# Patient Record
Sex: Male | Born: 1955 | Race: White | Hispanic: No | Marital: Married | Smoking: Former smoker
Health system: Southern US, Community
[De-identification: ages and names within clinical notes are randomized; demographics above are authoritative.]

## PROBLEM LIST (undated history)

## (undated) DIAGNOSIS — K219 Gastro-esophageal reflux disease without esophagitis: Secondary | ICD-10-CM

## (undated) DIAGNOSIS — K250 Acute gastric ulcer with hemorrhage: Secondary | ICD-10-CM

## (undated) DIAGNOSIS — M25551 Pain in right hip: Secondary | ICD-10-CM

## (undated) DIAGNOSIS — G4730 Sleep apnea, unspecified: Secondary | ICD-10-CM

## (undated) DIAGNOSIS — C679 Malignant neoplasm of bladder, unspecified: Secondary | ICD-10-CM

## (undated) DIAGNOSIS — K922 Gastrointestinal hemorrhage, unspecified: Secondary | ICD-10-CM

## (undated) DIAGNOSIS — R197 Diarrhea, unspecified: Secondary | ICD-10-CM

## (undated) DIAGNOSIS — R112 Nausea with vomiting, unspecified: Secondary | ICD-10-CM

## (undated) DIAGNOSIS — Z860100 Personal history of colon polyps, unspecified: Secondary | ICD-10-CM

## (undated) DIAGNOSIS — E785 Hyperlipidemia, unspecified: Secondary | ICD-10-CM

## (undated) DIAGNOSIS — I10 Essential (primary) hypertension: Secondary | ICD-10-CM

## (undated) DIAGNOSIS — S83207A Unspecified tear of unspecified meniscus, current injury, left knee, initial encounter: Secondary | ICD-10-CM

## (undated) DIAGNOSIS — I7300 Raynaud's syndrome without gangrene: Secondary | ICD-10-CM

## (undated) DIAGNOSIS — K648 Other hemorrhoids: Secondary | ICD-10-CM

## (undated) DIAGNOSIS — R079 Chest pain, unspecified: Secondary | ICD-10-CM

## (undated) DIAGNOSIS — N529 Male erectile dysfunction, unspecified: Secondary | ICD-10-CM

## (undated) DIAGNOSIS — G4710 Hypersomnia, unspecified: Secondary | ICD-10-CM

## (undated) DIAGNOSIS — M1990 Unspecified osteoarthritis, unspecified site: Secondary | ICD-10-CM

## (undated) DIAGNOSIS — N644 Mastodynia: Secondary | ICD-10-CM

## (undated) DIAGNOSIS — Z0000 Encounter for general adult medical examination without abnormal findings: Secondary | ICD-10-CM

## (undated) DIAGNOSIS — K5730 Diverticulosis of large intestine without perforation or abscess without bleeding: Secondary | ICD-10-CM

## (undated) DIAGNOSIS — M4800 Spinal stenosis, site unspecified: Secondary | ICD-10-CM

## (undated) DIAGNOSIS — N200 Calculus of kidney: Secondary | ICD-10-CM

## (undated) DIAGNOSIS — IMO0002 Reserved for concepts with insufficient information to code with codable children: Secondary | ICD-10-CM

## (undated) DIAGNOSIS — M542 Cervicalgia: Secondary | ICD-10-CM

## (undated) DIAGNOSIS — K5790 Diverticulosis of intestine, part unspecified, without perforation or abscess without bleeding: Secondary | ICD-10-CM

## (undated) DIAGNOSIS — I7090 Unspecified atherosclerosis: Secondary | ICD-10-CM

## (undated) DIAGNOSIS — H9190 Unspecified hearing loss, unspecified ear: Secondary | ICD-10-CM

## (undated) DIAGNOSIS — Z87442 Personal history of urinary calculi: Secondary | ICD-10-CM

## (undated) DIAGNOSIS — J45909 Unspecified asthma, uncomplicated: Secondary | ICD-10-CM

## (undated) DIAGNOSIS — M109 Gout, unspecified: Secondary | ICD-10-CM

## (undated) DIAGNOSIS — M25562 Pain in left knee: Secondary | ICD-10-CM

## (undated) DIAGNOSIS — D649 Anemia, unspecified: Secondary | ICD-10-CM

## (undated) DIAGNOSIS — M25552 Pain in left hip: Secondary | ICD-10-CM

## (undated) DIAGNOSIS — Z9889 Other specified postprocedural states: Secondary | ICD-10-CM

## (undated) DIAGNOSIS — I2510 Atherosclerotic heart disease of native coronary artery without angina pectoris: Secondary | ICD-10-CM

## (undated) DIAGNOSIS — E1165 Type 2 diabetes mellitus with hyperglycemia: Secondary | ICD-10-CM

## (undated) DIAGNOSIS — F1010 Alcohol abuse, uncomplicated: Secondary | ICD-10-CM

## (undated) DIAGNOSIS — D126 Benign neoplasm of colon, unspecified: Secondary | ICD-10-CM

## (undated) HISTORY — DX: Hyperlipidemia, unspecified: E78.5

## (undated) HISTORY — DX: Gastro-esophageal reflux disease without esophagitis: K21.9

## (undated) HISTORY — DX: Malignant neoplasm of bladder, unspecified: C67.9

## (undated) HISTORY — PX: TONSILLECTOMY: SUR1361

## (undated) HISTORY — DX: Unspecified atherosclerosis: I70.90

## (undated) HISTORY — DX: Gastrointestinal hemorrhage, unspecified: K92.2

## (undated) HISTORY — DX: Male erectile dysfunction, unspecified: N52.9

## (undated) HISTORY — DX: Encounter for general adult medical examination without abnormal findings: Z00.00

## (undated) HISTORY — DX: Sleep apnea, unspecified: G47.30

## (undated) HISTORY — DX: Pain in left hip: M25.552

## (undated) HISTORY — DX: Anemia, unspecified: D64.9

## (undated) HISTORY — DX: Pain in right hip: M25.551

## (undated) HISTORY — DX: Personal history of colonic polyps: Z86.010

## (undated) HISTORY — DX: Atherosclerotic heart disease of native coronary artery without angina pectoris: I25.10

## (undated) HISTORY — DX: Morbid (severe) obesity due to excess calories: E66.01

## (undated) HISTORY — DX: Diverticulosis of large intestine without perforation or abscess without bleeding: K57.30

## (undated) HISTORY — DX: Pain in left knee: M25.562

## (undated) HISTORY — DX: Reserved for concepts with insufficient information to code with codable children: IMO0002

## (undated) HISTORY — DX: Nausea with vomiting, unspecified: R11.2

## (undated) HISTORY — DX: Diarrhea, unspecified: R19.7

## (undated) HISTORY — DX: Essential (primary) hypertension: I10

## (undated) HISTORY — PX: CARDIAC CATHETERIZATION: SHX172

## (undated) HISTORY — DX: Nausea with vomiting, unspecified: Z98.890

## (undated) HISTORY — DX: Personal history of colon polyps, unspecified: Z86.0100

## (undated) HISTORY — DX: Unspecified tear of unspecified meniscus, current injury, left knee, initial encounter: S83.207A

## (undated) HISTORY — DX: Other hemorrhoids: K64.8

## (undated) HISTORY — PX: HERNIA REPAIR: SHX51

## (undated) HISTORY — DX: Acute gastric ulcer with hemorrhage: K25.0

## (undated) HISTORY — DX: Hypersomnia, unspecified: G47.10

## (undated) HISTORY — DX: Cervicalgia: M54.2

## (undated) HISTORY — DX: Alcohol abuse, uncomplicated: F10.10

## (undated) HISTORY — DX: Gout, unspecified: M10.9

## (undated) HISTORY — DX: Unspecified hearing loss, unspecified ear: H91.90

## (undated) HISTORY — PX: BICEPS TENDON REPAIR: SHX566

## (undated) HISTORY — PX: GASTRIC BYPASS: SHX52

## (undated) HISTORY — DX: Benign neoplasm of colon, unspecified: D12.6

## (undated) HISTORY — PX: OTHER SURGICAL HISTORY: SHX169

## (undated) HISTORY — DX: Mastodynia: N64.4

## (undated) HISTORY — PX: COLONOSCOPY: SHX174

## (undated) HISTORY — DX: Type 2 diabetes mellitus with hyperglycemia: E11.65

---

## 1992-04-20 HISTORY — PX: ANKLE SURGERY: SHX546

## 1998-02-28 ENCOUNTER — Ambulatory Visit (HOSPITAL_COMMUNITY): Admission: RE | Admit: 1998-02-28 | Discharge: 1998-02-28 | Payer: Self-pay | Admitting: Internal Medicine

## 1998-02-28 ENCOUNTER — Encounter: Payer: Self-pay | Admitting: Internal Medicine

## 1998-11-22 ENCOUNTER — Emergency Department (HOSPITAL_COMMUNITY): Admission: EM | Admit: 1998-11-22 | Discharge: 1998-11-22 | Payer: Self-pay | Admitting: Emergency Medicine

## 1999-02-27 ENCOUNTER — Ambulatory Visit (HOSPITAL_COMMUNITY): Admission: RE | Admit: 1999-02-27 | Discharge: 1999-02-27 | Payer: Self-pay | Admitting: Internal Medicine

## 1999-02-27 ENCOUNTER — Encounter (INDEPENDENT_AMBULATORY_CARE_PROVIDER_SITE_OTHER): Payer: Self-pay | Admitting: Specialist

## 2001-12-20 ENCOUNTER — Encounter: Payer: Self-pay | Admitting: Specialist

## 2001-12-20 ENCOUNTER — Ambulatory Visit (HOSPITAL_COMMUNITY): Admission: RE | Admit: 2001-12-20 | Discharge: 2001-12-20 | Payer: Self-pay | Admitting: *Deleted

## 2001-12-31 ENCOUNTER — Ambulatory Visit (HOSPITAL_COMMUNITY): Admission: RE | Admit: 2001-12-31 | Discharge: 2001-12-31 | Payer: Self-pay | Admitting: Specialist

## 2001-12-31 ENCOUNTER — Encounter: Payer: Self-pay | Admitting: Specialist

## 2002-11-09 ENCOUNTER — Encounter (INDEPENDENT_AMBULATORY_CARE_PROVIDER_SITE_OTHER): Payer: Self-pay | Admitting: Specialist

## 2002-11-09 ENCOUNTER — Ambulatory Visit (HOSPITAL_COMMUNITY): Admission: RE | Admit: 2002-11-09 | Discharge: 2002-11-09 | Payer: Self-pay | Admitting: Internal Medicine

## 2004-04-18 ENCOUNTER — Ambulatory Visit: Payer: Self-pay | Admitting: Internal Medicine

## 2004-06-30 ENCOUNTER — Ambulatory Visit: Payer: Self-pay | Admitting: Internal Medicine

## 2004-09-29 ENCOUNTER — Ambulatory Visit: Payer: Self-pay | Admitting: Internal Medicine

## 2004-11-11 ENCOUNTER — Ambulatory Visit: Payer: Self-pay | Admitting: Internal Medicine

## 2005-02-24 ENCOUNTER — Ambulatory Visit: Payer: Self-pay | Admitting: Family Medicine

## 2005-02-25 ENCOUNTER — Ambulatory Visit: Payer: Self-pay | Admitting: Family Medicine

## 2005-02-25 ENCOUNTER — Ambulatory Visit (HOSPITAL_COMMUNITY): Admission: RE | Admit: 2005-02-25 | Discharge: 2005-02-25 | Payer: Self-pay | Admitting: Family Medicine

## 2005-03-11 ENCOUNTER — Ambulatory Visit: Payer: Self-pay | Admitting: Internal Medicine

## 2005-05-04 ENCOUNTER — Encounter: Admission: RE | Admit: 2005-05-04 | Discharge: 2005-08-02 | Payer: Self-pay | Admitting: Internal Medicine

## 2005-06-30 ENCOUNTER — Ambulatory Visit: Payer: Self-pay | Admitting: Internal Medicine

## 2005-10-20 ENCOUNTER — Ambulatory Visit: Payer: Self-pay | Admitting: Internal Medicine

## 2005-11-18 LAB — HM COLONOSCOPY

## 2005-11-27 ENCOUNTER — Ambulatory Visit: Payer: Self-pay | Admitting: Internal Medicine

## 2005-12-08 ENCOUNTER — Ambulatory Visit: Payer: Self-pay | Admitting: Internal Medicine

## 2006-03-05 ENCOUNTER — Ambulatory Visit: Payer: Self-pay | Admitting: Internal Medicine

## 2006-05-18 ENCOUNTER — Ambulatory Visit: Payer: Self-pay | Admitting: Internal Medicine

## 2006-05-18 LAB — CONVERTED CEMR LAB
ALT: 43 units/L — ABNORMAL HIGH (ref 0–40)
AST: 37 units/L (ref 0–37)
BUN: 13 mg/dL (ref 6–23)
Cholesterol: 188 mg/dL (ref 0–200)
Creatinine, Ser: 1.1 mg/dL (ref 0.4–1.5)
Creatinine,U: 74.8 mg/dL
Direct LDL: 110 mg/dL
HDL: 38.4 mg/dL — ABNORMAL LOW (ref >39.0)
Hgb A1c MFr Bld: 6.8 % — ABNORMAL HIGH (ref 4.6–6.0)
Microalb Creat Ratio: 4 mg/g (ref 0.0–30.0)
Microalb, Ur: 0.3 mg/dL (ref 0.0–1.9)
Potassium: 4.1 meq/L (ref 3.5–5.1)
Total CHOL/HDL Ratio: 4.9
Triglycerides: 256 mg/dL (ref 0–149)
VLDL: 51 mg/dL — ABNORMAL HIGH (ref 0–40)

## 2006-06-21 ENCOUNTER — Ambulatory Visit: Payer: Self-pay | Admitting: Internal Medicine

## 2006-08-02 ENCOUNTER — Ambulatory Visit: Payer: Self-pay | Admitting: Internal Medicine

## 2006-09-24 ENCOUNTER — Encounter: Payer: Self-pay | Admitting: Internal Medicine

## 2006-10-20 ENCOUNTER — Encounter: Payer: Self-pay | Admitting: Internal Medicine

## 2006-11-09 ENCOUNTER — Encounter: Payer: Self-pay | Admitting: Internal Medicine

## 2006-11-10 ENCOUNTER — Encounter: Payer: Self-pay | Admitting: Internal Medicine

## 2007-02-08 ENCOUNTER — Encounter: Payer: Self-pay | Admitting: Internal Medicine

## 2007-02-28 ENCOUNTER — Telehealth (INDEPENDENT_AMBULATORY_CARE_PROVIDER_SITE_OTHER): Payer: Self-pay | Admitting: *Deleted

## 2007-03-07 ENCOUNTER — Telehealth (INDEPENDENT_AMBULATORY_CARE_PROVIDER_SITE_OTHER): Payer: Self-pay | Admitting: *Deleted

## 2007-03-24 ENCOUNTER — Encounter: Payer: Self-pay | Admitting: Internal Medicine

## 2007-06-30 ENCOUNTER — Encounter: Payer: Self-pay | Admitting: Internal Medicine

## 2007-07-05 ENCOUNTER — Ambulatory Visit: Payer: Self-pay | Admitting: Internal Medicine

## 2007-07-05 DIAGNOSIS — D126 Benign neoplasm of colon, unspecified: Secondary | ICD-10-CM

## 2007-07-05 DIAGNOSIS — N529 Male erectile dysfunction, unspecified: Secondary | ICD-10-CM

## 2007-07-05 DIAGNOSIS — K219 Gastro-esophageal reflux disease without esophagitis: Secondary | ICD-10-CM

## 2007-07-05 DIAGNOSIS — E119 Type 2 diabetes mellitus without complications: Secondary | ICD-10-CM | POA: Insufficient documentation

## 2007-07-05 HISTORY — DX: Gastro-esophageal reflux disease without esophagitis: K21.9

## 2007-07-05 HISTORY — DX: Male erectile dysfunction, unspecified: N52.9

## 2007-07-05 HISTORY — DX: Benign neoplasm of colon, unspecified: D12.6

## 2007-07-08 ENCOUNTER — Encounter (INDEPENDENT_AMBULATORY_CARE_PROVIDER_SITE_OTHER): Payer: Self-pay | Admitting: *Deleted

## 2007-08-11 ENCOUNTER — Encounter: Payer: Self-pay | Admitting: Internal Medicine

## 2007-08-17 ENCOUNTER — Encounter: Payer: Self-pay | Admitting: Internal Medicine

## 2007-09-06 ENCOUNTER — Telehealth (INDEPENDENT_AMBULATORY_CARE_PROVIDER_SITE_OTHER): Payer: Self-pay | Admitting: *Deleted

## 2007-10-06 ENCOUNTER — Encounter: Payer: Self-pay | Admitting: Internal Medicine

## 2007-12-01 ENCOUNTER — Ambulatory Visit: Payer: Self-pay | Admitting: Internal Medicine

## 2008-03-06 ENCOUNTER — Encounter: Payer: Self-pay | Admitting: Internal Medicine

## 2008-05-01 ENCOUNTER — Ambulatory Visit: Payer: Self-pay | Admitting: Internal Medicine

## 2008-05-29 ENCOUNTER — Encounter: Payer: Self-pay | Admitting: Internal Medicine

## 2008-06-18 ENCOUNTER — Telehealth (INDEPENDENT_AMBULATORY_CARE_PROVIDER_SITE_OTHER): Payer: Self-pay | Admitting: *Deleted

## 2008-06-29 ENCOUNTER — Telehealth: Payer: Self-pay | Admitting: Internal Medicine

## 2008-06-29 DIAGNOSIS — R0609 Other forms of dyspnea: Secondary | ICD-10-CM | POA: Insufficient documentation

## 2008-06-29 DIAGNOSIS — R0989 Other specified symptoms and signs involving the circulatory and respiratory systems: Secondary | ICD-10-CM | POA: Insufficient documentation

## 2008-07-03 ENCOUNTER — Encounter (INDEPENDENT_AMBULATORY_CARE_PROVIDER_SITE_OTHER): Payer: Self-pay | Admitting: *Deleted

## 2008-07-26 ENCOUNTER — Ambulatory Visit (HOSPITAL_BASED_OUTPATIENT_CLINIC_OR_DEPARTMENT_OTHER): Admission: RE | Admit: 2008-07-26 | Discharge: 2008-07-26 | Payer: Self-pay | Admitting: Pulmonary Disease

## 2008-07-26 ENCOUNTER — Ambulatory Visit: Payer: Self-pay | Admitting: Pulmonary Disease

## 2008-07-26 DIAGNOSIS — G4710 Hypersomnia, unspecified: Secondary | ICD-10-CM

## 2008-07-26 DIAGNOSIS — G4730 Sleep apnea, unspecified: Secondary | ICD-10-CM

## 2008-07-26 HISTORY — DX: Hypersomnia, unspecified: G47.10

## 2008-07-26 HISTORY — DX: Sleep apnea, unspecified: G47.30

## 2008-08-06 ENCOUNTER — Ambulatory Visit: Payer: Self-pay | Admitting: Pulmonary Disease

## 2008-08-10 ENCOUNTER — Ambulatory Visit: Payer: Self-pay | Admitting: Pulmonary Disease

## 2008-08-13 ENCOUNTER — Encounter: Payer: Self-pay | Admitting: Internal Medicine

## 2008-08-14 ENCOUNTER — Encounter: Payer: Self-pay | Admitting: Pulmonary Disease

## 2008-08-23 ENCOUNTER — Encounter: Payer: Self-pay | Admitting: Pulmonary Disease

## 2008-09-02 ENCOUNTER — Encounter: Payer: Self-pay | Admitting: Pulmonary Disease

## 2008-09-06 ENCOUNTER — Telehealth (INDEPENDENT_AMBULATORY_CARE_PROVIDER_SITE_OTHER): Payer: Self-pay | Admitting: *Deleted

## 2008-09-07 ENCOUNTER — Encounter: Payer: Self-pay | Admitting: Internal Medicine

## 2008-09-11 ENCOUNTER — Ambulatory Visit: Payer: Self-pay | Admitting: Pulmonary Disease

## 2008-09-27 ENCOUNTER — Telehealth (INDEPENDENT_AMBULATORY_CARE_PROVIDER_SITE_OTHER): Payer: Self-pay | Admitting: *Deleted

## 2008-10-04 ENCOUNTER — Encounter: Payer: Self-pay | Admitting: Internal Medicine

## 2008-10-29 ENCOUNTER — Ambulatory Visit: Payer: Self-pay | Admitting: Internal Medicine

## 2008-10-29 DIAGNOSIS — Z860100 Personal history of colon polyps, unspecified: Secondary | ICD-10-CM | POA: Insufficient documentation

## 2008-10-29 DIAGNOSIS — K5730 Diverticulosis of large intestine without perforation or abscess without bleeding: Secondary | ICD-10-CM

## 2008-10-29 HISTORY — DX: Personal history of colonic polyps: Z86.010

## 2008-10-29 HISTORY — DX: Diverticulosis of large intestine without perforation or abscess without bleeding: K57.30

## 2008-10-29 HISTORY — DX: Personal history of colon polyps, unspecified: Z86.0100

## 2008-10-31 ENCOUNTER — Encounter (INDEPENDENT_AMBULATORY_CARE_PROVIDER_SITE_OTHER): Payer: Self-pay | Admitting: *Deleted

## 2008-10-31 ENCOUNTER — Telehealth (INDEPENDENT_AMBULATORY_CARE_PROVIDER_SITE_OTHER): Payer: Self-pay | Admitting: *Deleted

## 2008-11-15 ENCOUNTER — Ambulatory Visit: Payer: Self-pay | Admitting: Internal Medicine

## 2008-11-16 ENCOUNTER — Encounter (INDEPENDENT_AMBULATORY_CARE_PROVIDER_SITE_OTHER): Payer: Self-pay | Admitting: *Deleted

## 2008-11-16 ENCOUNTER — Telehealth: Payer: Self-pay | Admitting: Internal Medicine

## 2008-11-16 ENCOUNTER — Emergency Department (HOSPITAL_COMMUNITY): Admission: EM | Admit: 2008-11-16 | Discharge: 2008-11-16 | Payer: Self-pay | Admitting: Emergency Medicine

## 2008-11-19 ENCOUNTER — Encounter: Payer: Self-pay | Admitting: Internal Medicine

## 2008-11-20 ENCOUNTER — Encounter (INDEPENDENT_AMBULATORY_CARE_PROVIDER_SITE_OTHER): Payer: Self-pay | Admitting: *Deleted

## 2008-11-20 LAB — CONVERTED CEMR LAB
Basophils Absolute: 0 10*3/uL (ref 0.0–0.1)
Basophils Relative: 0.5 % (ref 0.0–3.0)
Eosinophils Absolute: 0.2 10*3/uL (ref 0.0–0.7)
Eosinophils Relative: 3.6 % (ref 0.0–5.0)
Folate: 11.2 ng/mL
HCT: 37.3 % — ABNORMAL LOW (ref 39.0–52.0)
Hemoglobin: 12.7 g/dL — ABNORMAL LOW (ref 13.0–17.0)
Iron: 66 ug/dL (ref 42–165)
Lymphocytes Relative: 28.7 % (ref 12.0–46.0)
Lymphs Abs: 1.7 10*3/uL (ref 0.7–4.0)
MCHC: 34.2 g/dL (ref 30.0–36.0)
MCV: 85.8 fL (ref 78.0–100.0)
Monocytes Absolute: 0.6 10*3/uL (ref 0.1–1.0)
Monocytes Relative: 9.8 % (ref 3.0–12.0)
Neutro Abs: 3.3 10*3/uL (ref 1.4–7.7)
Neutrophils Relative %: 57.4 % (ref 43.0–77.0)
Platelets: 207 10*3/uL (ref 150.0–400.0)
RBC: 4.35 M/uL (ref 4.22–5.81)
RDW: 13 % (ref 11.5–14.6)
Saturation Ratios: 16.9 % — ABNORMAL LOW (ref 20.0–50.0)
Transferrin: 279.3 mg/dL (ref 212.0–360.0)
Vitamin B-12: 221 pg/mL (ref 211–911)
WBC: 5.8 10*3/uL (ref 4.5–10.5)

## 2008-11-26 ENCOUNTER — Ambulatory Visit: Payer: Self-pay | Admitting: Pulmonary Disease

## 2008-12-04 ENCOUNTER — Encounter: Payer: Self-pay | Admitting: Internal Medicine

## 2008-12-05 ENCOUNTER — Encounter: Payer: Self-pay | Admitting: Internal Medicine

## 2009-03-06 ENCOUNTER — Ambulatory Visit: Payer: Self-pay | Admitting: Internal Medicine

## 2009-03-06 DIAGNOSIS — N390 Urinary tract infection, site not specified: Secondary | ICD-10-CM | POA: Insufficient documentation

## 2009-03-06 LAB — CONVERTED CEMR LAB
Bilirubin Urine: NEGATIVE
Blood in Urine, dipstick: NEGATIVE
Glucose, Urine, Semiquant: NEGATIVE
Ketones, urine, test strip: NEGATIVE
Nitrite: NEGATIVE
Protein, U semiquant: NEGATIVE
Specific Gravity, Urine: <1.005
Urobilinogen, UA: NEGATIVE
WBC Urine, dipstick: NEGATIVE
pH: 7.5

## 2009-03-07 ENCOUNTER — Encounter: Payer: Self-pay | Admitting: Internal Medicine

## 2009-03-11 ENCOUNTER — Encounter (INDEPENDENT_AMBULATORY_CARE_PROVIDER_SITE_OTHER): Payer: Self-pay | Admitting: *Deleted

## 2009-05-06 ENCOUNTER — Telehealth (INDEPENDENT_AMBULATORY_CARE_PROVIDER_SITE_OTHER): Payer: Self-pay | Admitting: *Deleted

## 2009-06-10 ENCOUNTER — Encounter: Payer: Self-pay | Admitting: Internal Medicine

## 2009-06-11 ENCOUNTER — Encounter: Payer: Self-pay | Admitting: Internal Medicine

## 2009-09-03 ENCOUNTER — Ambulatory Visit: Payer: Self-pay | Admitting: Internal Medicine

## 2009-09-03 DIAGNOSIS — R609 Edema, unspecified: Secondary | ICD-10-CM | POA: Insufficient documentation

## 2009-09-06 ENCOUNTER — Ambulatory Visit: Payer: Self-pay | Admitting: Internal Medicine

## 2009-09-09 LAB — CONVERTED CEMR LAB
ALT: 40 units/L (ref 0–53)
AST: 39 units/L — ABNORMAL HIGH (ref 0–37)
Albumin: 4.1 g/dL (ref 3.5–5.2)
Alkaline Phosphatase: 46 units/L (ref 39–117)
BUN: 17 mg/dL (ref 6–23)
Bilirubin, Direct: 0.1 mg/dL (ref 0.0–0.3)
Creatinine, Ser: 0.9 mg/dL (ref 0.4–1.5)
Potassium: 3.9 meq/L (ref 3.5–5.1)
Pro B Natriuretic peptide (BNP): 13.5 pg/mL (ref 0.0–100.0)
TSH: 2.67 microintl units/mL (ref 0.35–5.50)
Total Bilirubin: 0.7 mg/dL (ref 0.3–1.2)
Total Protein: 6.3 g/dL (ref 6.0–8.3)

## 2009-09-10 ENCOUNTER — Encounter: Payer: Self-pay | Admitting: Internal Medicine

## 2009-10-17 ENCOUNTER — Ambulatory Visit: Payer: Self-pay | Admitting: Emergency Medicine

## 2009-10-17 DIAGNOSIS — J0100 Acute maxillary sinusitis, unspecified: Secondary | ICD-10-CM | POA: Insufficient documentation

## 2009-10-28 ENCOUNTER — Telehealth (INDEPENDENT_AMBULATORY_CARE_PROVIDER_SITE_OTHER): Payer: Self-pay | Admitting: *Deleted

## 2010-02-03 ENCOUNTER — Telehealth (INDEPENDENT_AMBULATORY_CARE_PROVIDER_SITE_OTHER): Payer: Self-pay | Admitting: *Deleted

## 2010-02-13 ENCOUNTER — Encounter: Payer: Self-pay | Admitting: Internal Medicine

## 2010-02-28 ENCOUNTER — Encounter: Payer: Self-pay | Admitting: Internal Medicine

## 2010-03-24 ENCOUNTER — Ambulatory Visit: Payer: Self-pay | Admitting: Family Medicine

## 2010-03-24 DIAGNOSIS — R300 Dysuria: Secondary | ICD-10-CM | POA: Insufficient documentation

## 2010-03-24 DIAGNOSIS — R509 Fever, unspecified: Secondary | ICD-10-CM | POA: Insufficient documentation

## 2010-03-24 LAB — CONVERTED CEMR LAB
Bilirubin Urine: NEGATIVE
Glucose, Urine, Semiquant: NEGATIVE
Ketones, urine, test strip: NEGATIVE
Nitrite: NEGATIVE
Protein, U semiquant: NEGATIVE
Specific Gravity, Urine: 1.015
Urobilinogen, UA: 0.2
pH: 7

## 2010-03-25 ENCOUNTER — Encounter: Payer: Self-pay | Admitting: Family Medicine

## 2010-03-26 ENCOUNTER — Encounter: Payer: Self-pay | Admitting: Family Medicine

## 2010-03-27 ENCOUNTER — Ambulatory Visit: Payer: Self-pay | Admitting: Family Medicine

## 2010-03-27 ENCOUNTER — Telehealth (INDEPENDENT_AMBULATORY_CARE_PROVIDER_SITE_OTHER): Payer: Self-pay | Admitting: *Deleted

## 2010-03-27 HISTORY — DX: Morbid (severe) obesity due to excess calories: E66.01

## 2010-03-27 LAB — CONVERTED CEMR LAB
Bilirubin Urine: NEGATIVE
Blood in Urine, dipstick: NEGATIVE
Glucose, Urine, Semiquant: NEGATIVE
Ketones, urine, test strip: NEGATIVE
Nitrite: NEGATIVE
Protein, U semiquant: 100
Specific Gravity, Urine: 1.03
Urobilinogen, UA: 0.2
WBC Urine, dipstick: NEGATIVE
pH: 5

## 2010-04-04 ENCOUNTER — Encounter: Payer: Self-pay | Admitting: Internal Medicine

## 2010-04-04 ENCOUNTER — Encounter: Payer: Self-pay | Admitting: Family Medicine

## 2010-04-10 ENCOUNTER — Encounter: Payer: Self-pay | Admitting: Internal Medicine

## 2010-04-20 DIAGNOSIS — S83207A Unspecified tear of unspecified meniscus, current injury, left knee, initial encounter: Secondary | ICD-10-CM

## 2010-04-20 HISTORY — DX: Unspecified tear of unspecified meniscus, current injury, left knee, initial encounter: S83.207A

## 2010-05-13 ENCOUNTER — Encounter: Payer: Self-pay | Admitting: Internal Medicine

## 2010-05-13 ENCOUNTER — Ambulatory Visit
Admission: RE | Admit: 2010-05-13 | Discharge: 2010-05-13 | Payer: Self-pay | Source: Home / Self Care | Attending: Family Medicine | Admitting: Family Medicine

## 2010-05-18 LAB — CONVERTED CEMR LAB
ALT: 46 units/L (ref 0–53)
AST: 42 units/L — ABNORMAL HIGH (ref 0–37)
Albumin: 4.1 g/dL (ref 3.5–5.2)
Alkaline Phosphatase: 44 units/L (ref 39–117)
BUN: 10 mg/dL (ref 6–23)
Basophils Absolute: 0 10*3/uL (ref 0.0–0.1)
Basophils Absolute: 0.1 10*3/uL (ref 0.0–0.1)
Basophils Relative: 1 % (ref 0.0–1.0)
Basophils Relative: 1 % (ref 0.0–3.0)
Bilirubin, Direct: 0.1 mg/dL (ref 0.0–0.3)
CO2: 27 meq/L (ref 19–32)
Calcium: 9 mg/dL (ref 8.4–10.5)
Chloride: 105 meq/L (ref 96–112)
Creatinine, Ser: 0.9 mg/dL (ref 0.4–1.5)
Eosinophils Absolute: 0.1 10*3/uL (ref 0.0–0.7)
Eosinophils Absolute: 0.2 10*3/uL (ref 0.0–0.6)
Eosinophils Relative: 2.6 % (ref 0.0–5.0)
Eosinophils Relative: 2.9 % (ref 0.0–5.0)
GFR calc Af Amer: 114 mL/min
GFR calc non Af Amer: 95 mL/min
Glucose, Bld: 179 mg/dL — ABNORMAL HIGH (ref 70–99)
HCT: 37 % — ABNORMAL LOW (ref 39.0–52.0)
HCT: 41.4 % (ref 39.0–52.0)
Hemoglobin: 13.1 g/dL (ref 13.0–17.0)
Hemoglobin: 14.2 g/dL (ref 13.0–17.0)
Lymphocytes Relative: 27.8 % (ref 12.0–46.0)
Lymphocytes Relative: 28.3 % (ref 12.0–46.0)
Lymphs Abs: 1.4 10*3/uL (ref 0.7–4.0)
MCHC: 34.4 g/dL (ref 30.0–36.0)
MCHC: 35.3 g/dL (ref 30.0–36.0)
MCV: 84.7 fL (ref 78.0–100.0)
MCV: 85.1 fL (ref 78.0–100.0)
Monocytes Absolute: 0.5 10*3/uL (ref 0.1–1.0)
Monocytes Absolute: 0.6 10*3/uL (ref 0.2–0.7)
Monocytes Relative: 9.2 % (ref 3.0–11.0)
Monocytes Relative: 9.2 % (ref 3.0–12.0)
Neutro Abs: 3 10*3/uL (ref 1.4–7.7)
Neutro Abs: 3.5 10*3/uL (ref 1.4–7.7)
Neutrophils Relative %: 58.9 % (ref 43.0–77.0)
Neutrophils Relative %: 59.1 % (ref 43.0–77.0)
PSA: 0.51 ng/mL (ref 0.10–4.00)
PSA: 1.03 ng/mL (ref 0.10–4.00)
Platelets: 208 10*3/uL (ref 150.0–400.0)
Platelets: 248 10*3/uL (ref 150–400)
Potassium: 4.2 meq/L (ref 3.5–5.1)
RBC: 4.37 M/uL (ref 4.22–5.81)
RBC: 4.86 M/uL (ref 4.22–5.81)
RDW: 12.3 % (ref 11.5–14.6)
RDW: 12.6 % (ref 11.5–14.6)
Sodium: 140 meq/L (ref 135–145)
TSH: 1.62 microintl units/mL (ref 0.35–5.50)
Total Bilirubin: 0.8 mg/dL (ref 0.3–1.2)
Total Protein: 6.5 g/dL (ref 6.0–8.3)
WBC: 5 10*3/uL (ref 4.5–10.5)
WBC: 6.1 10*3/uL (ref 4.5–10.5)

## 2010-05-20 NOTE — Miscellaneous (Signed)
Summary: PAP Device Phone Follow Up Report/[facility]  PAP Device Phone Follow Up Report/[facility]   Imported By: Sherian Rein 09/06/2008 10:36:19  _____________________________________________________________________  External Attachment:    Type:   Image     Comment:   External Document

## 2010-05-20 NOTE — Assessment & Plan Note (Signed)
Summary: toe infection.cbs   Vital Signs:  Patient Profile:   55 Years Old Male Weight:      300.6 pounds Temp:     98.4 degrees F oral Pulse rate:   84 / minute BP sitting:   132 / 82  (left arm) Cuff size:   large  Pt. in pain?   yes    Location:   over L great nail    Intensity:   4 or <    Type:       dull  Vitals Entered By: Shonna Chock (December 01, 2007 2:14 PM)                  Chief Complaint:  TOE INFECTION X 2 DAYS ON BIG TOE -LEFT FOOT. INJURED TOE END OF JUNE/FIRST OF JULY.  History of Present Illness: He rolled barrell over it  late 6/09; dull pain since. Initially black then simply dark. As of 11/29/07 nail oozing serous fluid with blood but some pus later that day. Rx: antibiotic ointment. A1c 6.8% in 7/09 @ [facility].    Current Allergies (reviewed today): ! MORPHINE     Review of Systems  General      Denies chills, fever, sweats, and weight loss.  MS      Denies joint pain, joint redness, and joint swelling.  Derm      See HPI      Complains of poor wound healing.   Physical Exam  General:     well-nourished,in no acute distress; alert,appropriate and cooperative throughout examination;overweight-appearing.   Pulses:     R and L dorsalis pedis and posterior tibial pulses are full and equal bilaterally Extremities:     L great nail grey with slight violaceous color @ bed of nail with slight tenderness & fluctuance. No discharge Skin:     Intact without suspicious lesions or rashes except toenail    Impression & Recommendations:  Problem # 1:  ABSCESS (ICD-682.9)  Orders: Podiatry Referral (Podiatry)  His updated medication list for this problem includes:    Amoxicillin-pot Clavulanate 875-125 Mg Tabs (Amoxicillin-pot clavulanate) .[name]... 1 q12 hrs with food   Problem # 2:  DIABETES MELLITUS, TYPE II, UNCONTROLLED (ICD-250.02)  His updated medication list for this problem includes:    Metformin Hcl 1000 Mg Tabs (Metformin hcl) .[name]...  1 by mouth two times a day    Altace 10 Mg Tabs (Ramipril) .[name]... 1 by mouth qd    Glipizide 10 Mg Tb24 (Glipizide) .[name]... 1 by mouth once daily    Actos 30 Mg Tabs (Pioglitazone hcl) .[name]... 1 by mouth once daily    Byetta 10 Mcg Pen 10 Mcg/0.41ml Soln (Exenatide) .Marland KitchenMarland KitchenMarland KitchenMarland Kitchen injected two times a day    Bayer Aspirin 325 Mg Tabs (Aspirin) .[name]... 1 by mouth two times a day  Orders: Podiatry Referral (Podiatry)   Complete Medication List: 1)  Metformin Hcl 1000 Mg Tabs (Metformin hcl) .[name].. 1 by mouth two times a day 2)  Altace 10 Mg Tabs (Ramipril) .[name].. 1 by mouth qd 3)  Prilosec 20 Mg Cpdr (Omeprazole) .[name].. 1 by mouth once daily 4)  Allegra 180 Mg Tabs (Fexofenadine hcl) .[name].. 1 by mouth once daily 5)  Glipizide 10 Mg Tb24 (Glipizide) .[name].. 1 by mouth once daily 6)  Actos 30 Mg Tabs (Pioglitazone hcl) .[name].. 1 by mouth once daily 7)  Lofibra 200 Mg Caps (Fenofibrate micronized) .[name].. 1 by mouth once daily 8)  Freestyle Lite Strp (Glucose blood) .Marland KitchenMarland KitchenMarland Kitchen  Test as directed 9)  Freestyle Lancets Misc (Lancets) .... Use as directed 10)  Simvastatin 20 Mg Tabs (Simvastatin) .[name].. 1 by mouth once daily 11)  Byetta 10 Mcg Pen 10 Mcg/0.77ml Soln (Exenatide) .[name].. injected two times a day 12)  Bayer Aspirin 325 Mg Tabs (Aspirin) .[name].. 1 by mouth two times a day 13)  Multivitamins Tabs (Multiple vitamin) .[name].. 1 by mouth once daily 14)  Fish Oil 1000 Mg Caps (Omega-3 fatty acids) .[name].. 1 by mouth qid 15)  Glucosamine-chondroitin 500-400 Mg Caps (Glucosamine-chondroitin) .[name].. 1 by mouth once daily 16)  Amoxicillin-pot Clavulanate 875-125 Mg Tabs (Amoxicillin-pot clavulanate) .[name].. 1 q12 hrs with food   Patient Instructions: 1)  saline soaks 3-4X/ day   Prescriptions: AMOXICILLIN-POT CLAVULANATE 875-125 MG  TABS (AMOXICILLIN-POT CLAVULANATE) 1 q12 hrs with food  #20 x 0   Entered and Authorized by:   Marga Melnick MD   Signed by:   Marga Melnick MD on 12/01/2007   Method used:   Print then Give to  Patient   RxID:   [id number]  ]

## 2010-05-20 NOTE — Progress Notes (Signed)
  Phone Note Outgoing Call Call back at [facility] Phone [phone]   Call placed by: Lajean Saver RN,  March 27, 2010 3:24 PM Call placed to: Patient Summary of Call: Patient seen by PCP today

## 2010-05-20 NOTE — Letter (Signed)
Summary: Results Follow up Letter  [facility] [place]  [address] [place], [place] [address]   Phone: [phone]  Fax: [phone]    10/31/2008 MRN: [id number]  Brent [name] [address] [place], [place]  [address]  Dear [name],  The following are the results of your recent test(s):  Test         Result    Pap Smear:        Normal _____  Not Normal _____ Comments: ______________________________________________________ Cholesterol: LDL(Bad cholesterol):         Your goal is less than:         HDL (Good cholesterol):       Your goal is more than: Comments:  ______________________________________________________ Mammogram:        Normal _____  Not Normal _____ Comments:  ___________________________________________________________________ Hemoccult:        Normal _____  Not normal _______ Comments:    _____________________________________________________________________ Other Tests: PLEASE SEE ATTACHED LABS AND CHEST X-RAY DONE ON 10/29/2008    We routinely do not discuss normal results over the telephone.  If you desire a copy of the results, or you have any questions about this information we can discuss them at your next office visit.   Sincerely,

## 2010-05-20 NOTE — Progress Notes (Signed)
Summary: REFILL REQUEST - ALTACE  Phone Note Call from Patient Call back at [phone] CELL   Caller: Patient Call For: Marga Melnick MD Reason for Call: Refill Medication Summary of Call: PATIENT NEEDS REFILL ON ALTACE 10MG , [facility], BUT PT NEEDS TO PICK-UP RX.  3 MO SUPPLY W/3 REFILLS.  PLEASE CALL PT WHEN READY. Initial call taken by: Magdalen Spatz [facility],  Sep 06, 2008 11:59 AM  Follow-up for Phone Call        CALLED PT INFORMED DUE FOR CPX AND  SCHEDULED CPX FOR 10/19/08 . PT INFORMED CAN FILL 90 DAYS RX READY FOR PICKUP Follow-up by: Kandice Hams,  Sep 07, 2008 9:59 AM      Prescriptions: ALTACE 10 MG  TABS (RAMIPRIL) 1 by mouth qd  #90 x 0   Entered by:   Kandice Hams   Authorized by:   Marga Melnick MD   Signed by:   Kandice Hams on 09/07/2008   Method used:   Print then Give to Patient   RxID:   [id number]

## 2010-05-20 NOTE — Letter (Signed)
Summary: [facility] Endocrinology  [facility] Endocrinology   Imported By: Lanelle Bal 12/12/2008 10:24:48  _____________________________________________________________________  External Attachment:    Type:   Image     Comment:   External Document

## 2010-05-20 NOTE — Progress Notes (Signed)
Summary: triage/ ACUTE ABDOMINAL PAIN   Phone Note Call from Patient Call back at Home Phone [phone]   Caller: Patient Call For: Marina Goodell Reason for Call: Talk to Nurse Summary of Call: Patient all of a sudden started having stoamch pain with nausea and vomitting wants to know what to do can he be seen today> Initial call taken by: Tawni Levy,  November 16, 2008 2:04 PM  Follow-up for Phone Call        Pt'.has acute onset of severe lower abd.pain with nausea and dry heaves.Says pain is so severe he can't even sit down.Advised to [facility] for evaluation. Follow-up by: Teryl Lucy RN,  November 16, 2008 2:11 PM  Additional Follow-up for Phone Call Additional follow up Details #1::        NOTED. PLEASE PULL ER INFORMATION, IF HE WAS SEEN.  Additional Follow-up by: Hilarie Fredrickson MD,  November 17, 2008 12:19 PM

## 2010-05-20 NOTE — Progress Notes (Signed)
Summary: med refill    Phone Note Refill Request   Refills Requested: Medication #1:  METFORMIN HCL 500 MG  TB24 2 two times a day with meals pt needs metformin 500  HCL for 90 day supply. he was given the regular rx   Method Requested: Mail to Patient Initial call taken by: Charolette Child,  March 07, 2007 4:56 PM  Follow-up for Phone Call        reprinted script . this metformin is the long acting, mailed to patient Follow-up by: Wendall Stade,  March 08, 2007 12:50 PM      Prescriptions: METFORMIN HCL 500 MG  TB24 (METFORMIN HCL) 2 two times a day with meals  #180 x 1   Entered by:   Wendall Stade   Authorized by:   Marga Melnick MD   Signed by:   Wendall Stade on 03/08/2007   Method used:   Print then Give to Patient   RxID:   [id number]

## 2010-05-20 NOTE — Letter (Signed)
Summary: External Correspondence--[facility]ICE VIS[facility]  External Correspondence--[facility]   Imported By: Freddy Jaksch 11/26/2006 11:11:40  _____________________________________________________________________  External Attachment:    Type:   Image     Comment:   External Document

## 2010-05-20 NOTE — Letter (Signed)
Summary: [facility] Endocrinology  [facility] Endocrinology   Imported By: Lanelle Bal 09/21/2009 09:50:19  _____________________________________________________________________  External Attachment:    Type:   Image     Comment:   External Document

## 2010-05-20 NOTE — Progress Notes (Signed)
Summary: Refill Request  Phone Note Refill Request Call back at [phone] Message from:  Pharmacy on October 28, 2009 8:22 AM  Refills Requested: Medication #1:  ALTACE 10 MG  TABS 1 by mouth qd   Dosage confirmed as above?Dosage Confirmed   Supply Requested: 3 months [facility]  Next Appointment Scheduled: NONE Initial call taken by: Lavell Islam,  October 28, 2009 8:22 AM    Prescriptions: ALTACE 10 MG  TABS (RAMIPRIL) 1 by mouth qd  #90 x 1   Entered by:   Shonna Chock   Authorized by:   Marga Melnick MD   Signed by:   Shonna Chock on 10/28/2009   Method used:   Faxed to ...       [facility] Environmental education officer)       [address] [address]       [place], [place]  [address]       Ph: [phone]       Fax: [phone]   RxID:   [id number]

## 2010-05-20 NOTE — Letter (Signed)
Summary: Results Follow up Letter  [facility] [place]  [address] [place], [place] [address]   Phone: [phone]  Fax: [phone]    03/11/2009 MRN: [id number]  Brent [name] [address] [place], [place]  [address]  Dear Brent [name],  The following are the results of your recent test(s):  Test         Result    Pap Smear:        Normal _____  Not Normal _____ Comments: ______________________________________________________ Cholesterol: LDL(Bad cholesterol):         Your goal is less than:         HDL (Good cholesterol):       Your goal is more than: Comments:  ______________________________________________________ Mammogram:        Normal _____  Not Normal _____ Comments:  ___________________________________________________________________ Hemoccult:        Normal _____  Not normal _______ Comments:    _____________________________________________________________________ Other Tests: URINE CX POSITIVE- SEE COMMENTS    We routinely do not discuss normal results over the telephone.  If you desire a copy of the results, or you have any questions about this information we can discuss them at your next office visit.   Sincerely,

## 2010-05-20 NOTE — Letter (Signed)
Summary: [facility] Endocrinology  [facility] Endocrinology   Imported By: Lanelle Bal 03/11/2010 09:26:07  _____________________________________________________________________  External Attachment:    Type:   Image     Comment:   External Document

## 2010-05-20 NOTE — Progress Notes (Signed)
Summary: [facility]-refill  Phone Note Refill Request   Refills Requested: Medication #1:  ALLEGRA 180 MG  TABS 1 by mouth once daily [facility]--[phone]  Initial call taken by: Freddy Jaksch,  Sep 06, 2007 10:28 AM      Prescriptions: ALLEGRA 180 MG  TABS (FEXOFENADINE HCL) 1 by mouth once daily  #90 x 1   Entered by:   Ardyth Man   Authorized by:   Wendall Stade   Signed by:   Ardyth Man on 09/06/2007   Method used:   Print then Give to Patient   RxID:   [id number]

## 2010-05-20 NOTE — Progress Notes (Signed)
Summary: altace rx  Phone Note Refill Request Call back at [phone]   Refills Requested: Medication #1:  ALTACE 10 MG  TABS 1 by mouth qd wife called for r send to [facility] saysthey lost other rx, she wouldlike rx for a year  Initial call taken by: Kandice Hams,  September 27, 2008 11:39 AM Caller: Spouse  Follow-up for Phone Call        [facility] at [phone] pt wife informed refax for 90 days pt has cpxin july Follow-up by: Kandice Hams,  September 27, 2008 11:48 AM      Prescriptions: ALTACE 10 MG  TABS (RAMIPRIL) 1 by mouth qd  #90 x 0   Entered by:   Kandice Hams   Authorized by:   Marga Melnick MD   Signed by:   Kandice Hams on 09/27/2008   Method used:   Reprint   RxID:   [id number]

## 2010-05-20 NOTE — Assessment & Plan Note (Signed)
Summary: follow up visit/[facility]   Visit Type:  Follow-up Primary Provider/Referring Provider:  Dr Marga Melnick  CC:  Pt here for follow up.  History of Present Illness: 55 y.o obese, diabetic with severe obstructive sleep apnea .  4/10>> severe obstructive sleep apnea with predominant hypopneas causing sleep fragmentation & hypoxia, AHI 33/h, lowest desatn 84%. Central apneas emerged on low levels of CPAP ? complex sleep apnea, did not tolerated cycling of BiPAP,final level tried was 9/5 with 5 central apneas & 1 mixed apnea  in 5 mins of sleep  reviewed data on IPAP 5-15 & EPAP 5-10>> good compliance, no leak, 95th percentile  pressure 15 He feels great, sleeping well, mask ok, presure Ok, no leak. Wt unchanged  Current Medications (verified): 1)  Metformin Hcl 1000 Mg  Tabs (Metformin Hcl) .[name].. 1 By Mouth Two Times A Day 2)  Altace 10 Mg  Tabs (Ramipril) .[name].. 1 By Mouth Qd 3)  Prilosec 20 Mg  Cpdr (Omeprazole) .[name].. 1 By Mouth Once Daily 4)  Cetirizine Hcl 10 Mg Tabs (Cetirizine Hcl) .... Take 1 Tablet By Mouth Once A Day 5)  Freestyle Lite   Strp (Glucose Blood) .... Test As Directed 6)  Freestyle Lancets   Misc (Lancets) .... Use As Directed 7)  Simvastatin 5 Mg Tabs (Simvastatin) .[name].. 1 By Mouth Once Daily 8)  Lantus 100 Unit/ml Soln (Insulin Glargine) .... 85 Units Daily 9)  Bayer Aspirin 325 Mg  Tabs (Aspirin) .[name].. 1 By Mouth Two Times A Day 10)  Fish Oil 2400mg   Caps (Omega-3 Fatty Acids) .[name].. 1 By Mouth Two Times A Day 11)  Fenofibrate 160 Mg Tabs (Fenofibrate) .... Take 1 Tablet By Mouth Once A Day 12)  Apidra 100 Unit/ml Soln (Insulin Glulisine) .... As Needed  Allergies (verified): 1)  ! Morphine  Past History:  Past Medical History: Last updated: 10/29/2008 frank DM 250.02 ACID REFLUX DISEASE (ICD-530.81) Sleep Apnea, CPAP , Dr Vassie Loll  Colonic polyps, hx of Diverticulosis, colon  Social History: Last updated: 10/29/2008 Former Smoker quit 1993.  Smoked x20  years.  Up to 3ppd ( actual consumption 1.5 ppd). Alcohol use-yes occasional Pt is married with children. Pt is employed as a Runner, broadcasting/film/video.  Diet : "not junky" Regular exercise-no  Past Pulmonary History:  Pulmonary History: Wife has witnessed apneas, family members have c/o loud snoring that has sometimes woken himself up. Epworth Sleepiness Score is 15/24. Bedtime is 10p to MN, latency is 15 mins or less, wakes up 2-3 times due to dry mouth, wakes up at 7A & feels tired, no morning headaches. Drinks 5-10 cups coffee/d, 3-4 soda cans/ d Gained 50 lbs in the last 2 yrs --> due to actose & lantus he is considering lap band surgery.  Review of Systems  The patient denies anorexia, fever, weight loss, weight gain, vision loss, decreased hearing, hoarseness, chest pain, syncope, dyspnea on exertion, peripheral edema, prolonged cough, headaches, hemoptysis, abdominal pain, melena, hematochezia, severe indigestion/heartburn, hematuria, incontinence, genital sores, muscle weakness, suspicious skin lesions, transient blindness, difficulty walking, depression, unusual weight change, abnormal bleeding, and enlarged lymph nodes.    Vital Signs:  Patient profile:   55 year old male Height:      68.25 inches Weight:      325.13 pounds O2 Sat:      92 % on Room air Temp:     98.5 degrees F oral Pulse rate:   87 / minute BP sitting:   126 / 72  (left arm)  Cuff size:   large  Vitals Entered By: Zackery Barefoot CMA (November 26, 2008 10:06 AM)  O2 Flow:  Room air CC: Pt here for follow up Comments Medications reviewed with patient Zackery Barefoot CMA  November 26, 2008 10:06 AM    Physical Exam  Additional Exam:  Gen. Pleasant, well-nourished, in no distress, obese ENT - no lesions, no post nasal drip, class 3 airway Neck: No JVD, no thyromegaly, no carotid bruits Lungs: no use of accessory muscles, no dullness to percussion, clear without rales or rhonchi  Cardiovascular: Rhythm regular, heart  sounds  normal, no murmurs or gallops, no peripheral edema Musculoskeletal: No deformities, no cyanosis or clubbing      Impression & Recommendations:  Problem # 1:  HYPERSOMNIA, ASSOCIATED WITH SLEEP APNEA (ICD-780.53)   IPAP 10-18, EPAP 5-10 The pathophysiology of obstructive sleep apnea, it's cardiovascular consequences and modes of treatment including CPAP were discussed with the patient in great detail.  Compliance encouraged, wt loss emphasized, asked to avoid meds with sedative side effects, cautioned against driving when sleepy.  CMN filled out.  Orders: Est. Patient Level III (30160)  Patient Instructions: 1)  Please schedule a follow-up appointment in 6 months. 2)  You are on autoCPAP : IPAP 10-18, EPAP 5-10 3)  Continue using your machine 4)  Avoid medications with sedative side effects

## 2010-05-20 NOTE — Assessment & Plan Note (Signed)
Summary: POSSIBLE UTI? (rm 5)   Vital Signs:  Patient Profile:   55 Years Old Male CC:      freq urination, fever, chills x last PM Height:     68.25 inches Weight:      338.25 pounds O2 Sat:      94 % O2 treatment:    Room Air Temp:     100 degrees F oral Pulse rate:   127 / minute Resp:     24 per minute BP sitting:   151 / 84  (left arm) Cuff size:   large  Pt. in pain?   yes    Location:   upper back    Intensity:   8    Type:       dull  Vitals Entered By: Lajean Saver RN (March 24, 2010 9:18 AM)                   Prior Medication List:  METFORMIN HCL 1000 MG  TABS (METFORMIN HCL) 1 by mouth two times a day ALTACE 10 MG  TABS (RAMIPRIL) 1 by mouth qd PRILOSEC 20 MG  CPDR (OMEPRAZOLE) 1 by mouth once daily CETIRIZINE HCL 10 MG TABS (CETIRIZINE HCL) Take 1 tablet by mouth once a day FREESTYLE LITE   STRP (GLUCOSE BLOOD) test as directed FREESTYLE LANCETS   MISC (LANCETS) use as directed SIMVASTATIN 5 MG TABS (SIMVASTATIN) 1 by mouth once daily LANTUS 100 UNIT/ML SOLN (INSULIN GLARGINE) 85 UNITS DAILY BAYER ASPIRIN 325 MG  TABS (ASPIRIN) 1 by mouth two times a day * FISH OIL 2400MG   CAPS (OMEGA-3 FATTY ACIDS) 1 by mouth two times a day FENOFIBRATE 160 MG TABS (FENOFIBRATE) Take 1 tablet by mouth once a day APIDRA 100 UNIT/ML SOLN (INSULIN GLULISINE) as needed AMOXICILLIN 875 MG TABS (AMOXICILLIN) 1 tab by mouth two times a day for 10 days TESSALON 200 MG CAPS (BENZONATATE) 1 cap by mouth up to three times a day as needed for cough   Current Allergies (reviewed today): ! MORPHINEHistory of Present Illness Chief Complaint: freq urination, fever, chills x last PM History of Present Illness:  Subjective:  Patient complains of onset last night of low back ache, hesitancy, urgency, nocturia, and decreased urine stream.  He had a fever to 99 with sweats.  No GI or resp symptoms   REVIEW OF SYSTEMS Constitutional Symptoms       Complains of fever and chills.      Denies night sweats, weight loss, weight gain, and fatigue.  Eyes       Denies change in vision, eye pain, eye discharge, glasses, contact lenses, and eye surgery. Ear/Nose/Throat/Mouth       Denies hearing loss/aids, change in hearing, ear pain, ear discharge, dizziness, frequent runny nose, frequent nose bleeds, sinus problems, sore throat, hoarseness, and tooth pain or bleeding.  Respiratory       Denies dry cough, productive cough, wheezing, shortness of breath, asthma, bronchitis, and emphysema/COPD.  Cardiovascular       Denies murmurs, chest pain, and tires easily with exhertion.    Gastrointestinal       Denies stomach pain, nausea/vomiting, diarrhea, constipation, blood in bowel movements, and indigestion. Genitourniary       Complains of painful urination.      Denies kidney stones and loss of urinary control. Neurological       Denies paralysis, seizures, and fainting/blackouts. Musculoskeletal       Denies muscle pain, joint pain, joint  stiffness, decreased range of motion, redness, swelling, muscle weakness, and gout.      Comments: back pain Skin       Denies bruising, unusual mles/lumps or sores, and hair/skin or nail changes.  Psych       Denies mood changes, temper/anger issues, anxiety/stress, speech problems, depression, and sleep problems. Other Comments: pt c/o freq urination, dysuria, chills, fever, back pain started last PM. took ASA 325mg  for fever. no bloody discharge or blood in urine.    Past History:  Past Medical History: Reviewed history from 10/29/2008 and no changes required. frank DM 250.02 ACID REFLUX DISEASE (ICD-530.81) Sleep Apnea, CPAP , Dr Vassie Loll  Colonic polyps, hx of Diverticulosis, colon  Past Surgical History: Reviewed history from 10/29/2008 and no changes required. ankle surgery 1994 Tonsillectomy age 21 colonoscopy polyps, tics 2004,11/2005; due 2012, Dr Marina Goodell  Family History: Reviewed history from 10/29/2008 and no changes  required. mother diabetes, hypertension brother-diabetes, allergies father hypertension, MI @65  , CVA, colon polyps two brothers MI age 53 & 46; ? 1 exposed to Agent Orange in [place]  Social History: Reviewed history from 10/29/2008 and no changes required. Former Smoker quit 1993.  Smoked x20 years.  Up to 3ppd ( actual consumption 1.5 ppd). Alcohol use-yes occasional Pt is married with children. Pt is employed as a Runner, broadcasting/film/video.  Diet : "not junky" Regular exercise-no   Objective:  No acute distress, alert and oriented  Eyes:  Pupils are equal, round, and reactive to light and accomdation.  Extraocular movement is intact.  Conjunctivae are not inflamed.  Ears:  Canals normal.  Tympanic membranes normal.   Nose:  No sinus tenderness Pharynx:  Normal  Neck:  Supple.  No adenopathy is present.   Lungs:  Clear to auscultation.  Breath sounds are equal.  Heart:  Regular rate and rhythm without murmurs, rubs, or gallops.  Abdomen:  Nontender without masses or hepatosplenomegaly.  Bowel sounds are present.  No CVA or flank tenderness.  Back:  Nontender Rectal Exam:    No external hemorrhoids are present.  No lesions are palpated in the rectal vault.  Stool is heme negative.  Prostate is moderately enlarged but symmetric without tenderness or nodules.  urinalysis (dipstick): 2+ leuks, trace blood CBC:  WBC 9.9, 80.3 GR, 6.8 MO, 12.9 L; Hgb 13.0 Assessment New Problems: FEVER UNSPECIFIED (ICD-780.60) DYSURIA (ICD-788.1)  SUSPECT PROSTATITIS  Plan New Medications/Changes: PYRIDIUM 200 MG TABS (PHENAZOPYRIDINE HCL) 1 by mouth three times a day pc  #6 x 0, 03/24/2010, Donna Christen MD CIPROFLOXACIN HCL 500 MG TABS (CIPROFLOXACIN HCL) 1 by mouth q12hr  #20 x 0, 03/24/2010, Donna Christen MD  New Orders: CBC w/Diff [[id number]] Urinalysis [CPT-81003] T-Culture, Urine [[id number]] New Patient Level III [13086] Planning Comments:   Urine Cx pending.  Increase fluids. Begin Cipro and  Pyridium. Follow-up with urologist or PCP if not improving.   The patient and/or caregiver has been counseled thoroughly with regard to medications prescribed including dosage, schedule, interactions, rationale for use, and possible side effects and they verbalize understanding.  Diagnoses and expected course of recovery discussed and will return if not improved as expected or if the condition worsens. Patient and/or caregiver verbalized understanding.  Prescriptions: PYRIDIUM 200 MG TABS (PHENAZOPYRIDINE HCL) 1 by mouth three times a day pc  #6 x 0   Entered and Authorized by:   Donna Christen MD   Signed by:   Donna Christen MD on 03/24/2010   Method used:   Print then Give  to Patient   RxID:   [id number] CIPROFLOXACIN HCL 500 MG TABS (CIPROFLOXACIN HCL) 1 by mouth q12hr  #20 x 0   Entered and Authorized by:   Donna Christen MD   Signed by:   Donna Christen MD on 03/24/2010   Method used:   Print then Give to Patient   RxID:   [id number]   Orders Added: 1)  CBC w/Diff [[id number]] 2)  Urinalysis [CPT-81003] 3)  T-Culture, Urine [[id number]] 4)  New Patient Level III [99203]    Laboratory Results   Urine Tests  Date/Time Received: March 24, 2010 9:52 AM  Date/Time Reported: March 24, 2010 9:52 AM   Routine Urinalysis   Color: yellow Appearance: Cloudy Glucose: negative   (Normal Range: Negative) Bilirubin: negative   (Normal Range: Negative) Ketone: negative   (Normal Range: Negative) Spec. Gravity: 1.015   (Normal Range: 1.003-1.035) Blood: trace-intact   (Normal Range: Negative) pH: 7.0   (Normal Range: 5.0-8.0) Protein: negative   (Normal Range: Negative) Urobilinogen: 0.2   (Normal Range: 0-1) Nitrite: negative   (Normal Range: Negative) Leukocyte Esterace: moderate   (Normal Range: Negative)

## 2010-05-20 NOTE — Assessment & Plan Note (Signed)
Summary: acute only for a head cold//ph   Vital Signs:  Patient Profile:   55 Years Old Male Weight:      311 pounds Temp:     98.3 degrees F oral Pulse rate:   84 / minute Resp:     17 per minute BP sitting:   126 / 88  (left arm) Cuff size:   large  Vitals Entered By: Shonna Chock (May 01, 2008 2:26 PM)                 Chief Complaint:  SINUS INFECTION: NOSE STOPPED UP and DRAINAGE AND OVERALL FEELS BAD. ONSET SUNDAY.  History of Present Illness: Onset 04/29/2008 as head congestion, progressive since. Rx: Mucinex Plain,Dayquil & Nyquil . FBS up to 180 max & up to 300 at bedtime . He has called Dr Lawerance Bach ,Endo @ [phone]    Current Allergies (reviewed today): ! MORPHINE     Review of Systems  General      Denies chills, fever, and sweats.  ENT      Complains of nasal congestion and sinus pressure.      Denies earache.      Frontal headache, facil pain & purulence  Resp      Denies cough and sputum productive.  Allergy      Complains of itching eyes and sneezing.      Denies seasonal allergies.      Rx: none   Physical Exam  General:     in no acute distress; alert,appropriate and cooperative throughout examination Eyes:     No corneal or conjunctival inflammation noted. EOMI. Perrla.  Vision grossly normal. Ears:     External ear exam shows no significant lesions or deformities.  Otoscopic examination reveals clear canals, tympanic membranes are intact bilaterally without bulging, retraction, inflammation or discharge. Hearing is grossly normal bilaterally. Nose:     External nasal examination shows no deformity or inflammation. Nasal mucosa are mildly erythematous (esp R septum)without lesions or exudates. Mouth:     Oral mucosa and oropharynx without lesions or exudates.  Teeth in good repair. Mild erythema. Hoarse (chronic) Lungs:     Normal respiratory effort, chest expands symmetrically. Lungs are clear to auscultation, no crackles or  wheezes. Cervical Nodes:     No lymphadenopathy noted Axillary Nodes:     No palpable lymphadenopathy    Impression & Recommendations:  Problem # 1:  SINUSITIS- ACUTE-NOS (ICD-461.9)  The following medications were removed from the medication list:    Amoxicillin-pot Clavulanate 875-125 Mg Tabs (Amoxicillin-pot clavulanate) .[name]... 1 q12 hrs with food  His updated medication list for this problem includes:    Amoxicillin-pot Clavulanate 875-125 Mg Tabs (Amoxicillin-pot clavulanate) .[name]... 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 1 by mouth two times a day    Altace 10 Mg Tabs (Ramipril) .[name]... 1 by mouth qd    Glipizide 10 Mg Tb24 (Glipizide) .[name]... 1 by mouth once daily    Actos 15 Mg Tabs (Pioglitazone hcl) .[name]... 1 by mouth once daily    Lantus 100 Unit/ml Soln (Insulin glargine) .Marland KitchenMarland KitchenMarland KitchenMarland Kitchen 55 units daily    Bayer Aspirin 325 Mg Tabs (Aspirin) .[name]... 1 by mouth two times a day   Complete Medication List: 1)  Metformin Hcl 1000 Mg Tabs (Metformin hcl) .[name].. 1 by mouth  two times a day 2)  Altace 10 Mg Tabs (Ramipril) .[name].. 1 by mouth qd 3)  Prilosec 20 Mg Cpdr (Omeprazole) .[name].. 1 by mouth once daily 4)  Allegra 180 Mg Tabs (Fexofenadine hcl) .[name].. 1 by mouth once daily 5)  Glipizide 10 Mg Tb24 (Glipizide) .[name].. 1 by mouth once daily 6)  Actos 15 Mg Tabs (Pioglitazone hcl) .[name].. 1 by mouth once daily 7)  Lofibra 200 Mg Caps (Fenofibrate micronized) .[name].. 1 by mouth once daily 8)  Freestyle Lite Strp (Glucose blood) .... Test as directed 9)  Freestyle Lancets Misc (Lancets) .... Use as directed 10)  Simvastatin 20 Mg Tabs (Simvastatin) .[name].. 1 by mouth once daily 11)  Lantus 100 Unit/ml Soln (Insulin glargine) .... 55 units daily 12)  Bayer Aspirin 325 Mg Tabs (Aspirin) .[name].. 1 by mouth two times a day 13)  Multivitamins Tabs (Multiple  vitamin) .[name].. 1 by mouth once daily 14)  Fish Oil 1000 Mg Caps (Omega-3 fatty acids) .[name].. 1 by mouth qid 15)  Glucosamine-chondroitin 500-400 Mg Caps (Glucosamine-chondroitin) .[name].. 1 by mouth once daily 16)  Amoxicillin-pot Clavulanate 875-125 Mg Tabs (Amoxicillin-pot clavulanate) .[name].. 1 q 12 hrs with ameal   Patient Instructions: 1)  Drink as much fluid as you can tolerate for the next few days. Neti pot once daily until sinus clear. Complex , low glycemic as discussed. Call Dr Lawerance Bach with weekly FBS  averages. Goals = random glucoses < 180 & fasting as close to 140 as possible   Prescriptions: AMOXICILLIN-POT CLAVULANATE 875-125 MG TABS (AMOXICILLIN-POT CLAVULANATE) 1 q 12 hrs with ameal  #20 x 0   Entered and Authorized by:   Marga Melnick MD   Signed by:   Marga Melnick MD on 05/01/2008   Method used:   Print then Give to Patient   RxID:   [id number]

## 2010-05-20 NOTE — Progress Notes (Signed)
Summary: Request for Sleep Study  Phone Note Call from Patient Call back at [phone]   Caller: Spouse-Holt Summary of Call: Patients wife called and said patient is tired all the time, snoring very loud, BS not under control (Endo is addressing). Patient said she is only able to get about 3 hours of sleep due to her husbands snoring. Leonor Liv feels that her husband needs referral for sleep study.  Dr.Hopper please advise  Chrae Malloy  June 29, 2008 12:19 PM   New Problems: OTHER DYSPNEA AND RESPIRATORY ABNORMALITIES (ICD-786.09) GUAIAC POSITIVE STOOL (ICD-578.1)   New Problems: OTHER DYSPNEA AND RESPIRATORY ABNORMALITIES (ICD-786.09) GUAIAC POSITIVE STOOL (ICD-578.1)

## 2010-05-20 NOTE — Letter (Signed)
Summary: Results Follow up Letter  [facility] [place]  [address] [place], [place] [address]   Phone: [phone]  Fax: [phone]    07/08/2007 MRN: [id number]  Brent [name] [address] [place], [place]  [address]  Dear Brent [name],  The following are the results of your recent test(s):  Test         Result    Pap Smear:        Normal _____  Not Normal _____ Comments: ______________________________________________________ Cholesterol: LDL(Bad cholesterol):         Your goal is less than:         HDL (Good cholesterol):       Your goal is more than: Comments:  ______________________________________________________ Mammogram:        Normal _____  Not Normal _____ Comments:  ___________________________________________________________________ Hemoccult:        Normal _____  Not normal _______ Comments:    _____________________________________________________________________ Other Tests:  Please see attached results and comments   We routinely do not discuss normal results over the telephone.  If you desire a copy of the results, or you have any questions about this information we can discuss them at your next office visit.   Sincerely,

## 2010-05-20 NOTE — Letter (Signed)
Summary: Primary Care Consult Scheduled Letter  [facility]  [address] [place], [place] [address]   Phone: [phone]  Fax: [phone]      07/03/2008 MRN: [id number]  DREY SHAFF [address] [place], [place]  [address]    Dear Mr. NEYHART,      We have scheduled an appointment for you.  At the recommendation of Dr. Marga Melnick, we have scheduled you a consult with Dr. Vassie Loll at [facility] for a Sleep Study on 07-26-08 at 10:30am, arrive by 10:15am.  Their address is [address] [place] [address]. The office phone number is [phone].  If this appointment day and time is not convenient for you, please feel free to call the office of the doctor you are being referred to at the number listed above and reschedule the appointment.     It is important for you to keep your scheduled appointments. We are here to make sure you are given good patient care. If you have questions or you have made changes to your appointment, please notify us at  [phone], ask for Renee.    Thank you,  Patient Care Coordinator [facility]    **Please contact [facility] 24 hours in advance if you are unable to keep your appointment to avoid a $50 fee**

## 2010-05-20 NOTE — Letter (Signed)
Summary: [facility]  [facility]   Imported By: Lanelle Bal 06/17/2009 12:09:16  _____________________________________________________________________  External Attachment:    Type:   Image     Comment:   External Document

## 2010-05-20 NOTE — Letter (Signed)
Summary: Francesco Sor Visit  [facility] Visit   Imported By: Freddy Jaksch 07/06/2007 14:57:59  _____________________________________________________________________  External Attachment:    Type:   Image     Comment:   External Document

## 2010-05-20 NOTE — Progress Notes (Signed)
Summary: REFILL  Phone Note Refill Request Message from:  Fax from Pharmacy on [facility] [phone]  OMEPRAZOLE DR CAP 20MG   Initial call taken by: Barb Merino,  May 06, 2009 9:27 AM    Prescriptions: PRILOSEC 20 MG  CPDR (OMEPRAZOLE) 1 by mouth once daily  #90 x 3   Entered by:   Shonna Chock   Authorized by:   Marga Melnick MD   Signed by:   Shonna Chock on 05/06/2009   Method used:   Faxed to ...       [facility])       [address] [address]       [place], [place]  [address]       Ph: [phone]       Fax: [phone]   RxID:   [id number]

## 2010-05-20 NOTE — Letter (Signed)
Summary: [facility]  [facility]   Imported By: Lanelle Bal 06/04/2008 14:43:46  _____________________________________________________________________  External Attachment:    Type:   Image     Comment:   External Document

## 2010-05-20 NOTE — Assessment & Plan Note (Signed)
Summary: SINUS & COUGH/KH   Vital Signs:  Patient Profile:   55 Years Old Male CC:      Headache, sinus drainage, cough, clear to green x 4 days Height:     68.25 inches Weight:      326 pounds O2 Sat:      98 % O2 treatment:    Room Air Temp:     97.3 degrees F oral Pulse rate:   93 / minute Pulse rhythm:   regular Resp:     18 per minute BP sitting:   139 / 89  (right arm) Cuff size:   large  Vitals Entered By: Emilio Math (October 17, 2009 11:28 AM)                  Current Allergies (reviewed today): ! MORPHINEHistory of Present Illness Chief Complaint: Headache, sinus drainage, cough, clear to green x 4 days History of Present Illness: HA, sinus drainage, fever to 101, sinus pressure, cough, green discharge constantly for 4 days.  No sick contacts, only mild seasonal allergies.  Had similar symptoms 6 months ago treated successfully with Amoxil.  He is currently taking Sudafed, diabetic Tussin with only minimal help.  Current Meds METFORMIN HCL 1000 MG  TABS (METFORMIN HCL) 1 by mouth two times a day ALTACE 10 MG  TABS (RAMIPRIL) 1 by mouth qd PRILOSEC 20 MG  CPDR (OMEPRAZOLE) 1 by mouth once daily CETIRIZINE HCL 10 MG TABS (CETIRIZINE HCL) Take 1 tablet by mouth once a day FREESTYLE LITE   STRP (GLUCOSE BLOOD) test as directed FREESTYLE LANCETS   MISC (LANCETS) use as directed SIMVASTATIN 5 MG TABS (SIMVASTATIN) 1 by mouth once daily LANTUS 100 UNIT/ML SOLN (INSULIN GLARGINE) 85 UNITS DAILY BAYER ASPIRIN 325 MG  TABS (ASPIRIN) 1 by mouth two times a day * FISH OIL 2400MG   CAPS (OMEGA-3 FATTY ACIDS) 1 by mouth two times a day FENOFIBRATE 160 MG TABS (FENOFIBRATE) Take 1 tablet by mouth once a day APIDRA 100 UNIT/ML SOLN (INSULIN GLULISINE) as needed AMOXICILLIN 875 MG TABS (AMOXICILLIN) 1 tab by mouth two times a day for 10 days TESSALON 200 MG CAPS (BENZONATATE) 1 cap by mouth up to three times a day as needed for cough  REVIEW OF SYSTEMS Constitutional Symptoms      Complains of fever and chills.     Denies night sweats, weight loss, weight gain, and fatigue.  Eyes       Denies change in vision, eye pain, eye discharge, glasses, contact lenses, and eye surgery. Ear/Nose/Throat/Mouth       Complains of frequent runny nose, sinus problems, and sore throat.      Denies hearing loss/aids, change in hearing, ear pain, ear discharge, dizziness, frequent nose bleeds, hoarseness, and tooth pain or bleeding.  Respiratory       Complains of productive cough.      Denies dry cough, wheezing, shortness of breath, asthma, bronchitis, and emphysema/COPD.  Cardiovascular       Denies murmurs, chest pain, and tires easily with exhertion.    Gastrointestinal       Denies stomach pain, nausea/vomiting, diarrhea, constipation, blood in bowel movements, and indigestion. Genitourniary       Denies painful urination, kidney stones, and loss of urinary control. Neurological       Complains of headaches.      Denies paralysis, seizures, and fainting/blackouts. Musculoskeletal       Denies muscle pain, joint pain, joint stiffness, decreased range of motion,  redness, swelling, muscle weakness, and gout.  Skin       Denies bruising, unusual mles/lumps or sores, and hair/skin or nail changes.  Psych       Denies mood changes, temper/anger issues, anxiety/stress, speech problems, depression, and sleep problems.  Past History:  Past Medical History: Reviewed history from 10/29/2008 and no changes required. frank DM 250.02 ACID REFLUX DISEASE (ICD-530.81) Sleep Apnea, CPAP , Dr Vassie Loll  Colonic polyps, hx of Diverticulosis, colon  Past Surgical History: Reviewed history from 10/29/2008 and no changes required. ankle surgery 1994 Tonsillectomy age 40 colonoscopy polyps, tics 2004,11/2005; due 2012, Dr Marina Goodell  Family History: Reviewed history from 10/29/2008 and no changes required. mother diabetes, hypertension brother-diabetes, allergies father hypertension, MI @65  ,  CVA, colon polyps two brothers MI age 74 & 6; ? 1 exposed to Agent Orange in [place]  Social History: Reviewed history from 10/29/2008 and no changes required. Former Smoker quit 1993.  Smoked x20 years.  Up to 3ppd ( actual consumption 1.5 ppd). Alcohol use-yes occasional Pt is married with children. Pt is employed as a Runner, broadcasting/film/video.  Diet : "not junky" Regular exercise-no Physical Exam General appearance: well developed, well nourished, no acute distress Head: left maxillary sinus tenderness Ears: normal, no lesions or deformities Nasal: mucosa pink, nonedematous, no septal deviation, turbinates normal Oral/Pharynx: tongue normal, posterior pharynx without erythema or exudate Neck: neck supple,  trachea midline, no masses Chest/Lungs: no rales, wheezes, or rhonchi bilateral, breath sounds equal without effort Heart: regular rate and  rhythm, no murmur Skin: no obvious rashes or lesions MSE: oriented to time, place, and person Assessment New Problems: ACUTE MAXILLARY SINUSITIS (ICD-461.0)   Plan New Medications/Changes: TESSALON 200 MG CAPS (BENZONATATE) 1 cap by mouth up to three times a day as needed for cough  #20 x 0, 10/17/2009, Hoyt Koch MD AMOXICILLIN 875 MG TABS (AMOXICILLIN) 1 tab by mouth two times a day for 10 days  #20 x 0, 10/17/2009, Hoyt Koch MD  New Orders: New Patient Level III [phone]  The patient and/or caregiver has been counseled thoroughly with regard to medications prescribed including dosage, schedule, interactions, rationale for use, and possible side effects and they verbalize understanding.  Diagnoses and expected course of recovery discussed and will return if not improved as expected or if the condition worsens. Patient and/or caregiver verbalized understanding.  Prescriptions: TESSALON 200 MG CAPS (BENZONATATE) 1 cap by mouth up to three times a day as needed for cough  #20 x 0   Entered and Authorized by:   Hoyt Koch MD    Signed by:   Hoyt Koch MD on 10/17/2009   Method used:   Printed then faxed to ...       [facility] (retail)       [address]       [place], [place]  [address]       Ph: [phone]       Fax: [phone]   RxID:   [id number] AMOXICILLIN 875 MG TABS (AMOXICILLIN) 1 tab by mouth two times a day for 10 days  #20 x 0   Entered and Authorized by:   Hoyt Koch MD   Signed by:   Hoyt Koch MD on 10/17/2009   Method used:   Printed then faxed to ...       [facility] (retail)       [address]       [place], [place]  [address]  Ph: [phone]       Fax: [phone]   RxID:   [id number]   Patient Instructions: 1)  Hydration, rest 2)  Continue Sudafed twice a day but monitor your BP.  Stop medicine if BP consistently high. 3)  Saline nasal spray/rinse. 4)  Continue to monitor your blood sugar 5)  If not improving, Follow-up with your primary care physician   Orders Added: 1)  New Patient Level III [phone]

## 2010-05-20 NOTE — Letter (Signed)
Summary: Results Follow up Letter  Normal at [place]  [address] [place], [place] [address]   Phone: [phone]  Fax: [phone]    11/20/2008 MRN: [id number]  Brent [name] [address] [place], [place]  [address]  Dear [name],  The following are the results of your recent test(s):  Test         Result    Pap Smear:        Normal _____  Not Normal _____ Comments: ______________________________________________________ Cholesterol: LDL(Bad cholesterol):         Your goal is less than:         HDL (Good cholesterol):       Your goal is more than: Comments:  ______________________________________________________ Mammogram:        Normal _____  Not Normal _____ Comments:  ___________________________________________________________________ Hemoccult:        Normal _____  Not normal _______ Comments:    _____________________________________________________________________ Other Tests: PLEASE SEE ATTACHED LABS DONE ON 11/15/2008 AND APPOINTMENT CARD TO RECHECK LABS IN 6 MONTHS   We routinely do not discuss normal results over the telephone.  If you desire a copy of the results, or you have any questions about this information we can discuss them at your next office visit.   Sincerely,

## 2010-05-20 NOTE — Assessment & Plan Note (Signed)
Summary: CPX/ALR   Vital Signs:  Patient profile:   55 year old male Height:      68.25 inches Weight:      324 pounds BMI:     49.08 Temp:     98.3 degrees F oral Pulse rate:   94 / minute Resp:     18 per minute BP sitting:   124 / 82  (left arm) Cuff size:   large  Vitals Entered By: Shonna Chock (October 29, 2008 8:09 AM) CC: cpx with fasting labs  Comments REVIEWED MED LIST, PATIENT AGREED DOSE AND INSTRUCTION CORRECT    Primary Care Provider:  Dr Marga Melnick  CC:  cpx with fasting labs .  History of Present Illness: Last A1c was ? in May,2010. CMET 10/04/2008 from [facility] WNL except glucose 117. F/U with Dr Lawerance Bach in 8/10. FBS 88-150, average = ?.  He counts carbs; 2 hr post meal < 200. No hypoglycemia. Sleep compliance report 5/16 reviewed; Sleep Apnea well controlled subjectively. No Diabetic retinopaathy as per Dr Hazle Quant 07/28/2008.  Preventive Screening-Counseling & Management  Caffeine-Diet-Exercise     Does Patient Exercise: no  Allergies: 1)  ! Morphine  Past History:  Past Medical History: frank DM 250.02 ACID REFLUX DISEASE (ICD-530.81) Sleep Apnea, CPAP , Dr Vassie Loll  Colonic polyps, hx of Diverticulosis, colon  Past Surgical History: ankle surgery 1994 Tonsillectomy age 31 colonoscopy polyps, tics 2004,11/2005; due 2012, Dr Marina Goodell  Family History: mother diabetes, hypertension brother-diabetes, allergies father hypertension, MI @65  , CVA, colon polyps two brothers MI age 46 & 39; ? 1 exposed to Agent Orange in [place]  Social History: Former Smoker quit 1993.  Smoked x20 years.  Up to 3ppd ( actual consumption 1.5 ppd). Alcohol use-yes occasional Pt is married with children. Pt is employed as a Runner, broadcasting/film/video.  Diet : "not junky" Regular exercise-no Does Patient Exercise:  no  Review of Systems General:  Complains of sleep disorder; denies fatigue and weight loss; Weight stable 320-325. Eyes:  Denies blurring, double vision, and vision loss-both eyes; se  Ophth report 4/10. ENT:  Denies difficulty swallowing and hoarseness. CV:  Complains of swelling of feet; denies chest pain or discomfort, difficulty breathing at night, difficulty breathing while lying down, leg cramps with exertion, lightheadness, near fainting, palpitations, shortness of breath with exertion, and swelling of hands; Edema worse on Actos. Resp:  Denies cough, excessive snoring, hypersomnolence, morning headaches, and sputum productive. GI:  Denies abdominal pain, bloody stools, dark tarry stools, and indigestion; No dysphagia. GU:  Denies discharge, dysuria, and hematuria. MS:  Complains of joint pain and low back pain; denies mid back pain and thoracic pain; Knees ,ankles & hands ; ASA two times a day .Podiatry nail surgery Fall 2009. Derm:  Denies changes in nail beds, dryness, hair loss, and poor wound healing. Neuro:  Denies numbness and tingling; No burning in feet. Psych:  Denies anxiety and depression. Endo:  Denies cold intolerance, excessive hunger, excessive thirst, excessive urination, and heat intolerance. Heme:  Denies abnormal bruising and bleeding. Allergy:  Complains of seasonal allergies; denies itching eyes and sneezing; Nasal congestion seasonally.  Physical Exam  General:  in no acute distress; alert,appropriate and cooperative throughout examination;overweight-appearing.   Head:  Normocephalic and atraumatic without obvious abnormalities.Moustache Eyes:  No corneal or conjunctival inflammation noted. Perrla. Funduscopic exam benign, without hemorrhages, exudates or papilledema. Ears:  External ear exam shows no significant lesions or deformities.  Otoscopic examination reveals clear canals, tympanic membranes are intact  bilaterally without bulging, retraction, inflammation or discharge. Hearing is grossly normal bilaterally. Nose:  External nasal examination shows no deformity or inflammation. Nasal mucosa are pink and moist without lesions or exudates.  Septum deviated to R Mouth:  Oral mucosa and oropharynx without lesions or exudates.  Teeth in good repair. Neck:  No deformities, masses, or tenderness noted. Lungs:  Normal respiratory effort, chest expands symmetrically. Lungs are clear to auscultation, no crackles or wheezes. Heart:  Normal rate and regular rhythm. S1 and S2 normal without gallop, murmur, click, rub . S4 with slurring Abdomen:  Bowel sounds positive,abdomen soft and non-tender without masses, organomegaly or hernias noted. Protubearnt abdomen Rectal:  No external abnormalities noted. Normal sphincter tone. No rectal masses or tenderness. Genitalia:  Testes bilaterally descended without nodularity, tenderness or masses. No scrotal masses or lesions. No penis lesions or urethral discharge. L varicocele.   Prostate:  Prostate gland firm and smooth, no enlargement, nodularity, tenderness, mass, asymmetry or induration. Pulses:  R and L carotid,radial,dorsalis pedis and posterior tibial pulses are full and equal bilaterally Extremities:  No clubbing, cyanosis. Nail deformity present. Mild crepitus of knees.1+ left pedal edema and 1+ right pedal edema.   Neurologic:  alert & oriented X3, sensation intact to light touch, and DTRs symmetrical and normal.   Skin:  Intact without suspicious lesions or rashes Cervical Nodes:  No lymphadenopathy noted Axillary Nodes:  No palpable lymphadenopathy Psych:  memory intact for recent and remote, normally interactive, and good eye contact.     Impression & Recommendations:  Problem # 1:  ROUTINE GENERAL MEDICAL EXAM@HEALTH  CARE FACL (ICD-V70.0)  Orders: EKG w/ Interpretation ([address]) TLB-CBC Platelet - w/Differential (85025-CBCD) TLB-PSA (Prostate Specific Antigen) (84153-PSA) T-2 View CXR (71020TC)  Problem # 2:  DIABETES MELLITUS, TYPE II, UNCONTROLLED (ICD-250.02)  as per Dr Lawerance Bach His updated medication list for this problem includes:    Metformin Hcl 1000 Mg Tabs (Metformin hcl)  .[name]... 1 by mouth two times a day    Altace 10 Mg Tabs (Ramipril) .[name]... 1 by mouth qd    Lantus 100 Unit/ml Soln (Insulin glargine) .Marland KitchenMarland KitchenMarland KitchenMarland Kitchen 85 units daily    Bayer Aspirin 325 Mg Tabs (Aspirin) .[name]... 1 by mouth two times a day    Apidra 100 Unit/ml Soln (Insulin glulisine) .[name]... As needed  Orders: EKG w/ Interpretation ([address])  Problem # 3:  HYPERSOMNIA, ASSOCIATED WITH SLEEP APNEA (ICD-780.53) as per Dr Vassie Loll  Problem # 4:  COLONIC POLYPS, HX OF (ICD-V12.72) colonoscopy due 2012 as per Dr Marina Goodell  Problem # 5:  ACID REFLUX DISEASE (ICD-530.81) Quiescent His updated medication list for this problem includes:    Prilosec 20 Mg Cpdr (Omeprazole) .[name]... 1 by mouth once daily  Complete Medication List: 1)  Metformin Hcl 1000 Mg Tabs (Metformin hcl) .[name].. 1 by mouth two times a day 2)  Altace 10 Mg Tabs (Ramipril) .[name].. 1 by mouth qd 3)  Prilosec 20 Mg Cpdr (Omeprazole) .[name].. 1 by mouth once daily 4)  Cetirizine Hcl 10 Mg Tabs (Cetirizine hcl) .... Take 1 tablet by mouth once a day 5)  Freestyle Lite Strp (Glucose blood) .... Test as directed 6)  Freestyle Lancets Misc (Lancets) .... Use as directed 7)  Simvastatin 5 Mg Tabs (Simvastatin) .[name].. 1 by mouth once daily 8)  Lantus 100 Unit/ml Soln (Insulin glargine) .... 85 units daily 9)  Bayer Aspirin 325 Mg Tabs (Aspirin) .[name].. 1 by mouth two times a day 10)  Fish Oil 2400mg  Caps (omega-3 Fatty Acids)  .Marland KitchenMarland KitchenMarland Kitchen  1 by mouth two times a day 11)  Fenofibrate 160 Mg Tabs (Fenofibrate) .... Take 1 tablet by mouth once a day 12)  Apidra 100 Unit/ml Soln (Insulin glulisine) .... As needed  Other Orders: Tdap => 72yrs IM ([address]) Admin 1st Vaccine ([address])  Patient Instructions: 1)  Follow a low glycemic index & load program as discussed. Prescriptions: ALTACE 10 MG  TABS (RAMIPRIL) 1 by mouth qd  #90 x 3   Entered and Authorized by:   Marga Melnick MD   Signed by:   Marga Melnick MD on 10/29/2008   Method used:   Print then Give to Patient   RxID:    [id number]    Immunizations Administered:  Tetanus Vaccine:    Vaccine Type: Tdap    Site: left deltoid    Mfr: GlaxoSmithKline    Dose: 0.5 ml    Route: IM    Given by: Chrae Malloy    Exp. Date: 02/21/2011    Lot #: [id number]    VIS given: 03/08/07 version given October 29, 2008.

## 2010-05-20 NOTE — Letter (Signed)
Summary: [facility]  [facility]   Imported By: Lanelle Bal 03/21/2010 09:50:49  _____________________________________________________________________  External Attachment:    Type:   Image     Comment:   External Document

## 2010-05-20 NOTE — Assessment & Plan Note (Signed)
Summary: f/u sleep ///kp   Primary Provider/Referring Provider:  Dr Marga Melnick  CC:  Sleep study follow-up.Brent [name]  History of Present Illness: 55 y.o obese, diabetic referred for evaluation of obstructive sleep apnea . Wife has witnessed apneas, family members have c/o loud snoring that has sometimes woken himself up. Epworth Sleepiness Score is 15/24. Bedtime is 10p to MN, latency is 15 mins or less, wakes up 2-3 times due to dry mouth, wakes up at 7A & feels tired, no morning headaches. Drinks 5-10 cups coffee/d, 3-4 soda cans/ d Gained 50 lbs in the last 2 yrs --> due to actose & lantus he is considering lap band surgery.  4/10 >> discussed PSG >> severe obstructive sleep apnea with predominant hypopneas causing sleep fragmentation & hypoxia, AHI 33/h, lowest desatn 84%. Central apneas emerged on low levels of CPAP ? complex sleep apnea, did not tolerated cycling of BiPAP,final level tried was 9/5 with 5 central apneas & 1 mixed apnea  in 5 mins of sleep There is no history suggestive of cataplexy, sleep paralysis or parasomnias   Current Medications (verified): 1)  Metformin Hcl 1000 Mg  Tabs (Metformin Hcl) .Brent [name].. 1 By Mouth Two Times A Day 2)  Altace 10 Mg  Tabs (Ramipril) .Brent [name].. 1 By Mouth Qd 3)  Prilosec 20 Mg  Cpdr (Omeprazole) .Brent [name].. 1 By Mouth Once Daily 4)  Cetirizine Hcl 10 Mg Tabs (Cetirizine Hcl) .... Take 1 Tablet By Mouth Once A Day 5)  Glipizide 10 Mg  Tb24 (Glipizide) .Brent [name].. 1 By Mouth Once Daily 6)  Actos 15 Mg Tabs (Pioglitazone Hcl) .Brent [name].. 1 By Mouth Once Daily 7)  Freestyle Lite   Strp (Glucose Blood) .... Test As Directed 8)  Freestyle Lancets   Misc (Lancets) .... Use As Directed 9)  Simvastatin 20 Mg  Tabs (Simvastatin) .Brent [name].. 1 By Mouth Once Daily 10)  Lantus 100 Unit/ml Soln (Insulin Glargine) .... 55 Units Daily 11)  Bayer Aspirin 325 Mg  Tabs (Aspirin) .Brent [name].. 1 By Mouth Two Times A Day 12)  Multivitamins   Tabs (Multiple Vitamin) .Brent [name].. 1 By Mouth Once Daily 13)  Fish Oil  1000 Mg  Caps (Omega-3 Fatty Acids) .Brent [name].. 1 By Mouth Qid 14)  Fenofibrate 160 Mg Tabs (Fenofibrate) .... Take 1 Tablet By Mouth Once A Day  Allergies (verified): 1)  ! Morphine  Past History:  Social History:    Former Smoker quit 1993.  Smoked x20 years.  Upto 3ppd.    Alcohol use-yes occasional    Pt is married with children. Pt is employed as a Runner, broadcasting/film/video.   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 1 exposed to Agent Orange  Social History:    Reviewed history from 07/26/2008 and no changes required:       Former Smoker quit 1993.  Smoked x20 years.  Upto 3ppd.       Alcohol use-yes occasional       Pt is married with children. Pt is employed as a Runner, broadcasting/film/video.    Vital Signs:  Patient profile:   55 year old male Height:      69 inches Weight:      323 pounds O2 Sat:      96 % Temp:     98.1 degrees F oral Pulse rate:   103 / minute BP sitting:   142 / 90  (right arm) Cuff size:   large  Vitals Entered By: Michel Bickers CMA (August 10, 2008 4:37 PM)  O2 Sat at Rest %:  96 O2 Flow:  room air  Physical Exam  Additional Exam:  Gen. Pleasant, well-nourished, in no distress, obese ENT - no lesions, no post nasal drip, class  3 airway Neck: No JVD, no thyromegaly, no carotid bruits Lungs: no use of accessory muscles, no dullness to percussion, clear without rales or rhonchi  Cardiovascular: Rhythm regular, heart sounds  normal, no murmurs or gallops, no peripheral edema Musculoskeletal: No deformities, no cyanosis or clubbing      Impression & Recommendations:  Problem # 1:  HYPERSOMNIA, ASSOCIATED WITH SLEEP APNEA (ICD-780.53) Detailed discussion about PSG & options. He prefers Auto BiPAP to repeat titration in lab. Will start with IPAP 5-15 & EPAP 5-10, get downlaod in 4 weeks & look for residual apneas-  unfortunately will not be able to assess central apneas with this approach.  Other Orders: Est. Patient Level III ([address]) DME Referral (DME)  Patient Instructions: 1)  Please schedule a follow-up appointment in 1 month. 2)  I willset you up with an auto-BiPAP

## 2010-05-20 NOTE — Progress Notes (Signed)
Summary: **lab results**  Phone Note Outgoing Call Call back at Home Phone [phone]   Call placed by: Shonna Chock,  October 31, 2008 10:15 AM Call placed to: Patient Summary of Call: Spoke with patient,  Borderline anemia; please complete stool cards & repeat CBC with iron panel ,B12/ folate levels in 2-3 weeks (285.9). PSA is excellent  Good report, Chest X-Ray.  Scheduled appointment to recheck 11/15/2008./[facility]  November 01, 2008 11:05 AM

## 2010-05-20 NOTE — Letter (Signed)
Summary: [facility]/CHEMISTRY [facility]  [facility]   Imported By: Freddy Jaksch 12/02/2006 11:39:15  _____________________________________________________________________  External Attachment:    Type:   Image     Comment:   External Document

## 2010-05-20 NOTE — Consult Note (Signed)
Summary: [facility]  [id number]   Imported By: Lanelle Bal 11/01/2007 11:10:03  _____________________________________________________________________  External Attachment:    Type:   Image     Comment:   External Document

## 2010-05-20 NOTE — Progress Notes (Signed)
Summary: Refill Requests  Phone Note Refill Request Call back at [phone] Message from:  Pharmacy on February 03, 2010 8:25 AM  Refills Requested: Medication #1:  ALTACE 10 MG  TABS 1 by mouth qd   Dosage confirmed as above?Dosage Confirmed   Brand Name Necessary? No   Supply Requested: 3 months   Last Refilled: 10/28/2009   Notes: 1 refill  Medication #2:  PRILOSEC 20 MG  CPDR 1 by mouth once daily   Dosage confirmed as above?Dosage Confirmed   Brand Name Necessary? No   Supply Requested: 3 months   Last Refilled: 05/06/2009   Notes: 3 refills [facility]  Next Appointment Scheduled: none Initial call taken by: Harold Barban,  February 03, 2010 8:31 AM    Prescriptions: PRILOSEC 20 MG  CPDR (OMEPRAZOLE) 1 by mouth once daily  #90 x 1   Entered by:   Army Fossa CMA   Authorized by:   Marga Melnick MD   Signed by:   Army Fossa CMA on 02/03/2010   Method used:   Faxed to ...       [facility] education officer)       [address] [address]       [place], [place]  [address]       Ph: [phone]       Fax: [phone]   RxID:   [id number] ALTACE 10 MG  TABS (RAMIPRIL) 1 by mouth qd  #90 x 1   Entered by:   Army Fossa CMA   Authorized by:   Marga Melnick MD   Signed by:   Army Fossa CMA on 02/03/2010   Method used:   Faxed to ...       [facility] education officer)       [address] [address]       [place], [place]  [address]       Ph: [phone]       Fax: [phone]   RxID:   [id number]

## 2010-05-20 NOTE — Letter (Signed)
Summary: [facility] General Surgery  [facility]   Imported By: Lanelle Bal 11/27/2008 12:34:55  _____________________________________________________________________  External Attachment:    Type:   Image     Comment:   External Document

## 2010-05-20 NOTE — Assessment & Plan Note (Signed)
Summary: kidney stone/vfw   Vital Signs:  Patient profile:   55 year old male Height:      68.25 inches (173.35 cm) Weight:      337.50 pounds (153.41 kg) BMI:     51.13 O2 Sat:      96 % on Room air Temp:     98.0 degrees F (36.67 degrees C) oral Pulse rate:   106 / minute BP sitting:   127 / 83  (right arm) Cuff size:   large  Vitals Entered By: Josph Macho RMA (March 27, 2010 8:41 AM)  O2 Flow:  Room air CC: Establish new patient/ Possible kidney stones or UTI/ CF Is Patient Diabetic? Yes   History of Present Illness: Patient is a 55 yo Caucasian male in today to establish care and discuss his recent UTI. Last Friday he drove to [place] and back, driving 18 hours in less than 2 days and since then he has noted some low bac pain. Initially he thought it was Musculoskeletal but then in developed some suprapubic pressure, urinary frequency, urgency, hesitancy and dysuria. On Monday, 3 days ago he was seen and placed on Cipro and Pyridium and his urine was cultured. It grew out E Coli susceptible to Cipro. Initially the dysuria improved with Pyridium but when this ran out it recurred slightly. This am the dysuria seems to have resolved, the urgency and frequency have improved. He had a low grade fever and chill occured last night but he feels better this am. He denies any congestion/HA/CP/palp/SOB/GI c/o. His Prilosec controls his reflux fairly well.  His Lantus has been increased to 100units by his endocrinologist and he uses Apidra in a sliding scale fashion. His hgba1c has increased to 7.2 and he has decided to proceed with gastric bypass in order to control his multiple medical conditions.  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) 11)  Impotence of Organic Origin  (ICD-607.84) 12)  Diabetes Mellitus, Type II, Uncontrolled  (ICD-250.02) 13)  Acid Reflux Disease  (ICD-530.81)  Current Problems (verified): 1)  Fever Unspecified  (ICD-780.60) 2)  Dysuria  (ICD-788.1) 3)  Acute Maxillary Sinusitis  (ICD-461.0) 4)  Edema- Localized  (ICD-782.3) 5)  Uti  (ICD-599.0) 6)  Diverticulosis, Colon  (ICD-562.10) 7)  Colonic Polyps, Hx of  (ICD-V12.72) 8)  Hypersomnia, Associated With Sleep Apnea  (ICD-780.53) 9)  Other Dyspnea and Respiratory Abnormalities  (ICD-786.09) 10)  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 1 By Mouth Two Times A Day 2)  Altace 10 Mg  Tabs (Ramipril) .[name].. 1 By Mouth Qd 3)  Prilosec 20 Mg  Cpdr (Omeprazole) .[name].. 1 By Mouth Once Daily 4)  Cetirizine Hcl 10 Mg Tabs (Cetirizine Hcl) .... Take 1 Tablet By Mouth Once A Day 5)  Freestyle Lite   Strp (Glucose Blood) .... Test As Directed 6)  Freestyle Lancets   Misc (  Lancets) .... Use As Directed 7)  Simvastatin 5 Mg Tabs (Simvastatin) .[name].. 1 By Mouth Once Daily 8)  Lantus 100 Unit/ml Soln (Insulin Glargine) .... 85 Units Daily 9)  Bayer Aspirin 325 Mg  Tabs (Aspirin) .[name].. 1 By Mouth Two Times A Day 10)  Fish Oil 2400mg   Caps (Omega-3 Fatty Acids) .[name].. 1 By Mouth Two Times A Day 11)  Fenofibrate 160 Mg Tabs (Fenofibrate) .... Take 1 Tablet By Mouth Once A Day 12)  Apidra 100 Unit/ml Soln (Insulin Glulisine) .... As Needed 13)  Ciprofloxacin Hcl 500 Mg Tabs (Ciprofloxacin Hcl) .[name].. 1 By Mouth Q12hr 14)  Pyridium 200 Mg Tabs (Phenazopyridine Hcl) .[name].. 1 By Mouth Three Times A Day Pc  Current  Medications (verified): 1)  Metformin Hcl 1000 Mg  Tabs (Metformin Hcl) .[name].. 1 By Mouth Two Times A Day 2)  Altace 10 Mg  Tabs (Ramipril) .[name].. 1 By Mouth Qd 3)  Prilosec 20 Mg  Cpdr (Omeprazole) .[name].. 1 By Mouth Once Daily 4)  Cetirizine Hcl 10 Mg Tabs (Cetirizine Hcl) .... Take 1 Tablet By Mouth Once A Day 5)  Freestyle Lite   Strp (Glucose Blood) .... Test As Directed 6)  Freestyle Lancets   Misc (Lancets) .... Use As Directed 7)  Simvastatin 5 Mg Tabs (Simvastatin) .[name].. 1 By Mouth Once Daily 8)  Lantus 100 Unit/ml Soln (Insulin Glargine) .... 85 Units Daily 9)  Bayer Aspirin 325 Mg  Tabs (Aspirin) .[name].. 1 By Mouth Two Times A Day 10)  Fish Oil 2400mg   Caps (Omega-3 Fatty Acids) .[name].. 1 By Mouth Two Times A Day 11)  Fenofibrate 160 Mg Tabs (Fenofibrate) .... Take 1 Tablet By Mouth Once A Day 12)  Apidra 100 Unit/ml Soln (Insulin Glulisine) .... As Needed 13)  Ciprofloxacin Hcl 500 Mg Tabs (Ciprofloxacin Hcl) .[name].. 1 By Mouth Q12hr 14)  Pyridium 200 Mg Tabs (Phenazopyridine Hcl) .[name].. 1 By Mouth Three Times A Day Pc  Allergies (verified): 1)  ! Morphine  Family History: mother diabetes, hypertension brother-diabetes, allergies father hypertension, MI @65  , CVA, colon polyps two brothers MI age 25 & 75; ? 1 exposed to Agent Orange in [place] Father: deceased@73  , CVA, HTN, MI @65 , colon polyps, reformed smoker Mother: deceased@81 , CVA, DM, HTN, hyperlipidemia Siblings:  Brother: deceased@56 , MI, DM, exposed to Agent Orange in [place] Brother: 62yo, DM, heart disease, hypercholesterolemia, multiple MIs Sister: 34yo, DM Brother: 15yo, back disease Brother: 56yo, renal lithiasis, hyperlipidemia, reflux, obesity MGM: deceased in 69s, HTN MGF: deceased unknown causes PGM: deceased unknown causes PGF: deceased unknown causes Children: Daughter: 72yo, A&W Daughter: 30yo, ADHD   Social History: Former Smoker quit 1993.  Smoked x20 years.  Up to 3ppd ( actual consumption 1.5  ppd). Alcohol use-yes occasional Pt is married with children. Pt is employed as a Runner, broadcasting/film/video, Designer, fashion/clothing.  Diet : "not junky" Regular exercise-no Seat belts regularly  Physical Exam  General:  Well-developed,well-nourished,in no acute distress; alert,appropriate and cooperative throughout examination. Obese Head:  Normocephalic and atraumatic without obvious abnormalities. No apparent alopecia or balding. Ears:  External ear exam shows no significant lesions or deformities.  Otoscopic examination reveals clear canals, tympanic membranes are intact bilaterally without bulging, retraction, inflammation or discharge. Hearing is grossly normal bilaterally. Nose:  External nasal examination shows no deformity or inflammation. Nasal mucosa are pink and moist without lesions or exudates. Mouth:  Oral mucosa and oropharynx without lesions or exudates.  Teeth in good repair. Neck:  No deformities, masses, or tenderness  noted. Lungs:  Normal respiratory effort, chest expands symmetrically. Lungs are clear to auscultation, no crackles or wheezes. Heart:  Normal rate and regular rhythm. S1 and S2 normal without gallop, murmur, click, rub or other extra sounds. Abdomen:  Bowel sounds positive,abdomen soft and non-tender without masses, organomegaly or hernias noted. Msk:  No deformity or scoliosis noted of thoracic or lumbar spine.   Extremities:  No clubbing, cyanosis, edema, or deformity noted .   Cervical Nodes:  No lymphadenopathy noted Psych:  Cognition and judgment appear intact. Alert and cooperative with normal attention span and concentration. No apparent delusions, illusions, hallucinations   Impression & Recommendations:  Problem # 1:  UTI (ICD-599.0)  His updated medication list for this problem includes:    Ciprofloxacin Hcl 500 Mg Tabs (Ciprofloxacin hcl) .[name]... 1 by mouth q12hr    Pyridium 200 Mg Tabs (Phenazopyridine hcl) .[name]... 1 by mouth three times a day as needed  pain  Orders: UA Dipstick w/o Micro (manual) (98119) EColi infection susceptible to Cipro and symptoms improving today, will finish 10 day course of Cirpto and can take a seond course of Pyridium if needed, given a prescription for this today  Problem # 2:  DIABETES MELLITUS, TYPE II, UNCONTROLLED (ICD-250.02)  His updated medication list for this problem includes:    Metformin Hcl 1000 Mg Tabs (Metformin hcl) .[name]... 1 by mouth two times a day    Altace 10 Mg Tabs (Ramipril) .[name]... 1 by mouth qd    Lantus 100 Unit/ml Soln (Insulin glargine) .Marland KitchenMarland KitchenMarland KitchenMarland Kitchen 100 units daily    Bayer Aspirin 325 Mg Tabs (Aspirin) .[name]... 1 by mouth two times a day    Apidra 100 Unit/ml Soln (Insulin glulisine) .[name]... As needed, sliding scale three times a day with meals and qhs [facility] and he reports his last HGBA1C was 7.2   Problem # 3:  ACID REFLUX DISEASE (ICD-530.81)  His updated medication list for this problem includes:    Prilosec 20 Mg Cpdr (Omeprazole) .[name]... 1 by mouth once daily Patient reports a good response to Prilosec, will continue the same, avoid offending foods  Problem # 4:  MORBID OBESITY (ICD-278.01) Has initiated the process to undergo Gastric Bypass Surgery with Dr Lily Peer at [facility]., patient will notify us if he needs weight done here and he is encouraged to proceed  Complete Medication List: 1)  Metformin Hcl 1000 Mg Tabs (Metformin hcl) .[name].. 1 by mouth two times a day 2)  Altace 10 Mg Tabs (Ramipril) .[name].. 1 by mouth qd 3)  Prilosec 20 Mg Cpdr (Omeprazole) .[name].. 1 by mouth once daily 4)  Cetirizine Hcl 10 Mg Tabs (Cetirizine hcl) .... Take 1 tablet by mouth once a day 5)  Freestyle Lite Strp (Glucose blood) .... Test as directed 6)  Freestyle Lancets Misc (Lancets) .... Use as directed 7)  Simvastatin 5 Mg Tabs (Simvastatin) .[name].. 1 by mouth once daily 8)  Lantus 100 Unit/ml Soln (Insulin glargine) .[name].. 100 units daily 9)  Bayer Aspirin 325 Mg Tabs (Aspirin)  .[name].. 1 by mouth two times a day 10)  Fish Oil 2400mg  Caps (omega-3 Fatty Acids)  .[name].. 1 by mouth two times a day 11)  Fenofibrate 160 Mg Tabs (Fenofibrate) .... Take 1 tablet by mouth once a day 12)  Apidra 100 Unit/ml Soln (Insulin glulisine) .... As needed, sliding scale three times a day with meals and qhs 13)  Ciprofloxacin Hcl 500 Mg Tabs (Ciprofloxacin hcl) .[name].. 1 by mouth q12hr  14)  Pyridium 200 Mg Tabs (Phenazopyridine hcl) .[name].. 1 by mouth three times a day as needed pain  Patient Instructions: 1)  Please schedule a follow-up appointment in 1 month for annual PE 2)  Urine- dip prior to visit ICD-9 : 599.0, at visit is fine 3)  Drink plenty of fluids up to 3-4 quarts a day. Cranberry juice is especially recommended in addition to large amounts of water. Avoid caffeine & carbonated drinks, they tend to irritate the bladder, Return in 3-5 days if you're not better: sooner if you're feeling worse.  4)  Take [phone] mg of tylenol every 6 hours as needed for relief of pain or comfort of fever. Avoid taking more than 3000 mg in a 24 hour period( can cause liver damage in higher doses).  5)  Try ice and Aspercreme to painful foot or joints up to two times a day 6)  Use Witch Hazel Astringent to cleanse irritated skin lesions 7)  Apply Cortaid two times a day to lesion on left wrist as needed Prescriptions: PYRIDIUM 200 MG TABS (PHENAZOPYRIDINE HCL) 1 by mouth three times a day as needed pain  #6 x 0   Entered and Authorized by:   Danise Edge MD   Signed by:   Danise Edge MD on 03/27/2010   Method used:   Faxed to ...       [facility] (retail)       [address]       [place], [place]  [address]       Ph: [phone]       Fax: [phone]   RxID:   [id number]    Orders Added: 1)  UA Dipstick w/o Micro (manual) [81002] 2)  Est. Patient Level IV [84132]    Preventive Care Screening  Last Flu Shot:    Date:  01/18/2010    Results:  historical     Laboratory Results   Urine Tests    Routine Urinalysis   Color: Amber Appearance: Clear Glucose: negative   (Normal Range: Negative) Bilirubin: negative   (Normal Range: Negative) Ketone: negative   (Normal Range: Negative) Spec. Gravity: 1.030   (Normal Range: 1.003-1.035) Blood: negative   (Normal Range: Negative) pH: 5.0   (Normal Range: 5.0-8.0) Protein: 100   (Normal Range: Negative) Urobilinogen: 0.2   (Normal Range: 0-1) Nitrite: negative   (Normal Range: Negative) Leukocyte Esterace: negative   (Normal Range: Negative)

## 2010-05-20 NOTE — Miscellaneous (Signed)
Summary: Removed problem from list   Clinical Lists Changes  Problems: Removed problem of GUAIAC POSITIVE STOOL (ICD-578.1)

## 2010-05-20 NOTE — Letter (Signed)
Summary: External Correspondence--FOR A GUN PERMIT  External Correspondence--FOR A GUN PERMIT   Imported By: Freddy Jaksch 11/04/2006 09:19:09  _____________________________________________________________________  External Attachment:    Type:   Image     Comment:   GUN PERMIT

## 2010-05-20 NOTE — Letter (Signed)
[facility]   Imported By: Lanelle Bal 06/14/2009 13:08:14  _____________________________________________________________________  External Attachment:    Type:   Image     Comment:   External Document

## 2010-05-20 NOTE — Assessment & Plan Note (Signed)
Summary: legs swollen/cbs   Vital Signs:  Patient profile:   55 year old male Weight:      334 pounds Temp:     98.5 degrees F oral Pulse rate:   82 / minute Resp:     16 per minute BP sitting:   120 / 86  (left arm)  Vitals Entered By: Jeremy Johann CMA (Sep 03, 2009 3:28 PM) CC: swelling in both calf and ankle x 6months Comments REVIEWED MED LIST, PATIENT AGREED DOSE AND INSTRUCTION CORRECT    Primary Care Provider:  Dr Marga Melnick  CC:  swelling in both calf and ankle x 6months.  History of Present Illness: His edema has recurred as of 08/29/2009 w/o trigger; it resolved last  year after Actos was D/Ced. No response to sodium restriction. Not on amlodipine. FBS  90-120;2hr post meal < 175. No symptomtaic hypoglycemia.  Allergies: 1)  ! Morphine  Review of Systems General:  Complains of sleep disorder; denies fatigue; on BiPAP with control. CV:  Denies chest pain or discomfort, difficulty breathing at night, difficulty breathing while lying down, leg cramps with exertion, shortness of breath with exertion, and swelling of hands. Resp:  Denies cough, excessive snoring, hypersomnolence, morning headaches, and sputum productive.  Physical Exam  General:  in no acute distress; alert,appropriate and cooperative throughout examination Eyes:  No corneal or conjunctival inflammation noted.  Perrla.No jaundice Neck:  No deformities, masses, or tenderness noted. Lungs:  Normal respiratory effort, chest expands symmetrically. Lungs are clear to auscultation, no crackles or wheezes. Heart:  normal rate, regular rhythm, no murmur, no gallop, no rub, no JVD, and no HJR.  S4 Abdomen:  Bowel sounds positive,abdomen soft and non-tender without masses, organomegaly or hernias noted. Pulses:  R and L carotid,radial,dorsalis pedis and posterior tibial pulses are full and equal bilaterally Extremities:  1+ left pedal edema and 1+ right pedal edema.   Skin:  Intact without suspicious  lesions or rashes. No jaundice   Impression & Recommendations:  Problem # 1:  EDEMA- LOCALIZED (ICD-782.3)  Complete Medication List: 1)  Metformin Hcl 1000 Mg Tabs (Metformin hcl) .[name].. 1 by mouth two times a day 2)  Altace 10 Mg Tabs (Ramipril) .[name].. 1 by mouth qd 3)  Prilosec 20 Mg Cpdr (Omeprazole) .[name].. 1 by mouth once daily 4)  Cetirizine Hcl 10 Mg Tabs (Cetirizine hcl) .... Take 1 tablet by mouth once a day 5)  Freestyle Lite Strp (Glucose blood) .... Test as directed 6)  Freestyle Lancets Misc (Lancets) .... Use as directed 7)  Simvastatin 5 Mg Tabs (Simvastatin) .[name].. 1 by mouth once daily 8)  Lantus 100 Unit/ml Soln (Insulin glargine) .... 85 units daily 9)  Bayer Aspirin 325 Mg Tabs (Aspirin) .[name].. 1 by mouth two times a day 10)  Fish Oil 2400mg  Caps (omega-3 Fatty Acids)  .[name].. 1 by mouth two times a day 11)  Fenofibrate 160 Mg Tabs (Fenofibrate) .... Take 1 tablet by mouth once a day 12)  Apidra 100 Unit/ml Soln (Insulin glulisine) .... As needed  Patient Instructions: 1)  Limit your Sodium (Salt) to less than 4 grams a day (slightly less than 1 teaspoon) to prevent fluid retention, swelling, or worsening or symptoms. Fasting labs :BUN,creat, K+;Hepatic Panel ;BNP;TSH (782.3). 2D ECHOcardiogram if edema progresses.

## 2010-05-20 NOTE — Letter (Signed)
Summary: External Correspondence-OUTPATIENT CHEMISTRY RESULTS/DR.BURNS  External Correspondence-OUTPATIENT CHEMISTRY RESULTS/DR.BURNS   Imported By: Vanessa [place] 12/02/2006 10:49:50  _____________________________________________________________________  External Attachment:    Type:   Image     Comment:   External Document

## 2010-05-20 NOTE — Assessment & Plan Note (Signed)
Summary: cpx,med refill//tl  Medications Added GLIPIZIDE 10 MG  TB24 (GLIPIZIDE) 1 by mouth once daily ACTOS 15 MG  TABS (PIOGLITAZONE HCL) 1 by mouth once daily LOFIBRA 200 MG  CAPS (FENOFIBRATE MICRONIZED) 1 by mouth once daily FREESTYLE LITE   STRP (GLUCOSE BLOOD) test as directed FREESTYLE LANCETS   MISC (LANCETS) use as directed SIMVASTATIN 20 MG  TABS (SIMVASTATIN) 1 by mouth once daily BYETTA 10 MCG PEN 10 MCG/0.04ML  SOLN (EXENATIDE) injected two times a day      Allergies Added: ! MORPHINE  Vital Signs:  Patient Profile:   55 Years Old Male Weight:      289.50 pounds Pulse rate:   60 / minute Pulse rhythm:   regular Resp:     17 per minute BP sitting:   120 / 80  (left arm) Cuff size:   large  Pt. in pain?   no  Vitals Entered By: Wendall Stade (July 05, 2007 8:41 AM)                  Chief Complaint:  cpx.  History of Present Illness: Dr Juanell Fairly OV 06/30/07 , A1c & lipids from 10/08 reviewed.    Current Allergies (reviewed today): ! MORPHINE  Past Medical History:    acid reflux    metabolic syndrome    Current Problems:     DYSMETABOLIC SYNDROME X (ICD-277.7);  frank DM 250.02    ACID REFLUX DISEASE (ICD-530.81)      Past Surgical History:    ankle surgery 1994    Tonsillectomy age 65    colonoscopy polyps, tics 2004,11/2005; due 2010   Family History:    mother diabetes, hypertension    brother diabetes     father hypertension, MI @65  , CVA, colon polyps    two brothers MI age 66,43; ? 1 exposed to Agent [employer]  Social History:    Reviewed history and no changes required:       Former Smoker quit 1993       Alcohol use-yes occasional   Risk Factors:  Tobacco use:  quit    Year quit:  1993 Alcohol use:  yes    Type:  occa Exercise:  yes    Type:  walks #x/ week 30 min w/o symptoms   Review of Systems  General      Complains of sleep disorder.      Denies chills, fever, sweats, and weight loss.  Eyes      Denies  blurring, double vision, halos, and vision loss-both eyes.      Ophth exam will be scheduled  ENT      Complains of decreased hearing and nasal congestion.      Denies difficulty swallowing, ear discharge, earache, hoarseness, nosebleeds, postnasal drainage, ringing in ears, sinus pressure, and sore throat.      Seasonal congestion  CV      Denies bluish discoloration of lips or nails, chest pain or discomfort, difficulty breathing at night, difficulty breathing while lying down, leg cramps with exertion, palpitations, shortness of breath with exertion, swelling of feet, and swelling of hands.  Resp      Complains of excessive snoring.      Denies cough, hypersomnolence, morning headaches, shortness of breath, and sputum productive.      He questions sleep apnea  GI      Denies abdominal pain, bloody stools, change in bowel habits, constipation, dark tarry stools, diarrhea, indigestion, nausea, and vomiting.  No dysphagia  GU      Complains of decreased libido and erectile dysfunction.      Denies dysuria, hematuria, nocturia, and urinary frequency.      Viagra of no benefit  MS      Complains of joint pain.      Denies joint redness, joint swelling, low back pain, mid back pain, muscle aches, muscle weakness, and thoracic pain.      Glucosamine & ASA as per Dr Fleeta Emmer      Denies changes in color of skin, changes in nail beds, dryness, excessive perspiration, flushing, hair loss, itching, lesion(s), poor wound healing, and rash.      Perineal  skin tag; lesion R calf  Neuro      Denies difficulty with concentration, disturbances in coordination, headaches, memory loss, numbness, poor balance, sensation of room spinning, and tingling.  Psych      Denies anxiety, depression, easily angered, easily tearful, and irritability.  Endo      Denies cold intolerance, excessive hunger, excessive thirst, excessive urination, heat intolerance, polyuria, and weight  change.  Heme      Denies abnormal bruising and bleeding.  Allergy      Complains of itching eyes, seasonal allergies, and sneezing.   Physical Exam  General:     Well-developed,well-nourished,in no acute distress; alert,appropriate and cooperative throughout examination; overweight-appearing.   Head:     Normocephalic and atraumatic without obvious abnormalities. No apparent alopecia or balding. Eyes:     No corneal or conjunctival inflammation noted.  Perrla. Funduscopic exam benign, without hemorrhages, exudates or papilledema. Ears:     External ear exam shows no significant lesions or deformities.  Otoscopic examination reveals clear canals, tympanic membranes are intact bilaterally without bulging, retraction, inflammation or discharge. Hearing is grossly normal bilaterally. Nose:     External nasal examination shows no deformity or inflammation. Nasal mucosa are pink and moist without lesions or exudates. Mouth:     Oral mucosa and oropharynx without lesions or exudates.  Teeth in good repair. Neck:     No deformities, masses, or tenderness noted.Thyroid WNL Breasts:     Gynecomastia, pseudo Lungs:     Normal respiratory effort, chest expands symmetrically. Lungs are clear to auscultation, no crackles or wheezes. Heart:     Normal rate and regular rhythm. S1 and S2 normal without gallop, murmur, click, rub. S4. Abdomen:     Bowel sounds positive,abdomen soft and non-tender without masses, organomegaly or hernias noted.Massive abdomen Rectal:     No external abnormalities noted. Normal sphincter tone. No rectal masses or tenderness. FOB neg Genitalia:     Testes bilaterally descended without nodularity, tenderness or masses. No scrotal masses or lesions. No penis lesions or urethral discharge. Prostate:     Prostate gland firm and smooth, no enlargement, nodularity, tenderness, mass, asymmetry or induration. Msk:     No deformity or scoliosis noted of thoracic or lumbar  spine.   Pulses:     R and L carotid,radial,dorsalis pedis and posterior tibial pulses are full and equal bilaterally Extremities:     No clubbing, cyanosis, edema, or deformity noted with normal full range of motion of all joints. Mild crepitus knees  Neurologic:     alert & oriented X3, gait normal, and DTRs symmetrical and normal.   Skin:     Granuloma R calf. Tinea versicolor inframammary ares. Skin tag prineum. Hyperpigmentation inguinal areas Cervical Nodes:     No lymphadenopathy noted Axillary Nodes:  No palpable lymphadenopathy Psych:     memory intact for recent and remote, normally interactive, good eye contact, not anxious appearing, and not depressed appearing.   Non compliance     Impression & Recommendations:  Problem # 1:  ROUTINE GENERAL MEDICAL EXAM@HEALTH  CARE FACL (ICD-V70.0)  Orders: EKG w/ Interpretation (93000) TLB-BMP (Basic Metabolic Panel-BMET) (80048-METABOL) TLB-CBC Platelet - w/Differential (85025-CBCD) TLB-Hepatic/Liver Function Pnl (80076-HEPATIC) TLB-TSH (Thyroid Stimulating Hormone) (84443-TSH) TLB-PSA (Prostate Specific Antigen) (84153-PSA)   Problem # 2:  ACID REFLUX DISEASE (ICD-530.81) Assessment: Unchanged  His updated medication list for this problem includes:    Prilosec 20 Mg Cpdr (Omeprazole) .[name]... 1 by mouth once daily   Problem # 3:  DIABETES MELLITUS, TYPE II, UNCONTROLLED (ICD-250.02)  His updated medication list for this problem includes:    Metformin Hcl 500 Mg Tb24 (Metformin hcl) .[name]... 2 two times a day with meals    Altace 10 Mg Tabs (Ramipril) .[name]... 1 by mouth qd    Glipizide 10 Mg Tb24 (Glipizide) .[name]... 1 by mouth once daily    Actos 15 Mg Tabs (Pioglitazone hcl) .[name]... 1 by mouth once daily    Byetta 10 Mcg Pen 10 Mcg/0.3ml Soln (Exenatide) .Marland KitchenMarland KitchenMarland KitchenMarland Kitchen injected two times a day   Problem # 4:  IMPOTENCE OF ORGANIC ORIGIN (ICD-607.84)  Problem # 5:  BENIGN NEOPLASM OF COLON (ICD-211.3)  Complete Medication  List: 1)  Metformin Hcl 500 Mg Tb24 (Metformin hcl) .... 2 two times a day with meals 2)  Altace 10 Mg Tabs (Ramipril) .[name].. 1 by mouth qd 3)  Prilosec 20 Mg Cpdr (Omeprazole) .[name].. 1 by mouth once daily 4)  Allegra 180 Mg Tabs (Fexofenadine hcl) .[name].. 1 by mouth once daily 5)  Glipizide 10 Mg Tb24 (Glipizide) .[name].. 1 by mouth once daily 6)  Actos 15 Mg Tabs (Pioglitazone hcl) .[name].. 1 by mouth once daily 7)  Lofibra 200 Mg Caps (Fenofibrate micronized) .[name].. 1 by mouth once daily 8)  Freestyle Lite Strp (Glucose blood) .... Test as directed 9)  Freestyle Lancets Misc (Lancets) .... Use as directed 10)  Simvastatin 20 Mg Tabs (Simvastatin) .[name].. 1 by mouth once daily 11)  Byetta 10 Mcg Pen 10 Mcg/0.60ml Soln (Exenatide) .[name].. injected two times a day   Patient Instructions: 1)  Complete stool cards. Review GOAL SHEET; follow [employer], low carb/ low glycemic program.    Prescriptions: PRILOSEC 20 MG  CPDR (OMEPRAZOLE) 1 by mouth once daily  #90 x 3   Entered and Authorized by:   Marga Melnick MD   Signed by:   Marga Melnick MD on 07/05/2007   Method used:   Print then Give to Patient   RxID:   [id number] ALTACE 10 MG  TABS (RAMIPRIL) 1 by mouth qd  #90 x 3   Entered and Authorized by:   Marga Melnick MD   Signed by:   Marga Melnick MD on 07/05/2007   Method used:   Print then Give to Patient   RxID:   [id number] FREESTYLE LITE   STRP (GLUCOSE BLOOD) test as directed  #450 x 3   Entered and Authorized by:   Marga Melnick MD   Signed by:   Marga Melnick MD on 07/05/2007   Method used:   Print then Give to Patient   RxID:   [id number]  ]

## 2010-05-20 NOTE — Assessment & Plan Note (Signed)
Summary: f/u 1 month///kp   Primary Provider/Referring Provider:  Dr Marga Melnick  CC:  1 month followup.  No problems with bipap machine-states that he wears every night without problems.  Averages 7 hours of sleep every night.  No complaints today.[name]  History of Present Illness: 55 y.o obese, diabetic with severe obstructive sleep apnea .  4/10>> severe obstructive sleep apnea with predominant hypopneas causing sleep fragmentation & hypoxia, AHI 33/h, lowest desatn 84%. Central apneas emerged on low levels of CPAP ? complex sleep apnea, did not tolerated cycling of BiPAP,final level tried was 9/5 with 5 central apneas & 1 mixed apnea  in 5 mins of sleep  reviewed data on IPAP 5-15 & EPAP 5-10>> good compliance, no leak, 95th percentile  pressure 15 He feels great, sleeping well, mask ok, presure Ok, no leak  Current Medications (verified): 1)  Metformin Hcl 1000 Mg  Tabs (Metformin Hcl) .[name].. 1 By Mouth Two Times A Day 2)  Altace 10 Mg  Tabs (Ramipril) .[name].. 1 By Mouth Qd 3)  Prilosec 20 Mg  Cpdr (Omeprazole) .[name].. 1 By Mouth Once Daily 4)  Cetirizine Hcl 10 Mg Tabs (Cetirizine Hcl) .... Take 1 Tablet By Mouth Once A Day 5)  Freestyle Lite   Strp (Glucose Blood) .... Test As Directed 6)  Freestyle Lancets   Misc (Lancets) .... Use As Directed 7)  Simvastatin 20 Mg  Tabs (Simvastatin) .[name].. 1 By Mouth Once Daily 8)  Lantus 100 Unit/ml Soln (Insulin Glargine) .... 55 Units Daily 9)  Bayer Aspirin 325 Mg  Tabs (Aspirin) .[name].. 1 By Mouth Two Times A Day 10)  Multivitamins   Tabs (Multiple Vitamin) .[name].. 1 By Mouth Once Daily 11)  Fish Oil 1000 Mg  Caps (Omega-3 Fatty Acids) .[name].. 1 By Mouth Qid 12)  Fenofibrate 160 Mg Tabs (Fenofibrate) .... Take 1 Tablet By Mouth Once A Day  Allergies: 1)  ! Morphine  Past History:  Past medical, surgical, family and social histories (including risk factors) reviewed, and no changes noted (except as noted below).  Past Medical History:    Reviewed  history from 07/05/2007 and no changes required:    acid reflux    metabolic syndrome    Current Problems:     DYSMETABOLIC SYNDROME X (ICD-277.7);  frank DM 250.02    ACID REFLUX DISEASE (ICD-530.81)  Past Surgical History:    Reviewed history from 07/05/2007 and no changes required:    ankle surgery 1994    Tonsillectomy age 20    colonoscopy polyps, tics 2004,11/2005; due 2010  Past Pulmonary History:  Pulmonary History:    Wife has witnessed apneas, family members have c/o loud snoring that has sometimes woken himself up. Epworth Sleepiness Score is 15/24.    Bedtime is 10p to MN, latency is 15 mins or less, wakes up 2-3 times due to dry mouth, wakes up at 7A & feels tired, no morning headaches. Drinks 5-10 cups coffee/d, 3-4 soda cans/ d    Gained 50 lbs in the last 2 yrs --> due to actose & lantus    he is considering lap band surgery.  Family History:    Reviewed history from 07/26/2008 and no changes required:       mother diabetes, hypertension       brother-diabetes, allergies       father hypertension, MI @65  , CVA, colon polyps       two brothers MI age 84,43; ? 1 exposed to Agent [employer]  History:    Reviewed history from 07/26/2008 and no changes required:       Former Smoker quit 1993.  Smoked x20 years.  Upto 3ppd.       Alcohol use-yes occasional       Pt is married with children. Pt is employed as a Runner, broadcasting/film/video.    Review of Systems  The patient denies mask leak, skin irritation, pressure sores or abrasions, dry eyes, dry nose and/or throat, dry mouth, nasal congestion, epistaxis, sinus drainage, rhinorrhea, cough, chest discomfort, aerophagia, difficulty exhaling, CPAP unit is too noisy, difficulty falling asleep, difficulty staying asleep, pulling mask off in the middle of the night, waking up choking, bed partner intolerance, frequent night time awakenings, prolonged middle of the night awakening, early morning awakenings, excessive daytime sleepiness,  frequent episodes of sudden loss of muscle tone, loud snoring, witnessed apneas, difficulty waking, non restorative sleep, shortness of breath, nighttime chest pain, waking up gasping for air, nighttime cough, sleep walking, night terrors, eating during sleep, restless legs syndrome, frequent leg jerks during sleep, acting out dreams, and recurrent nightmares.    Vital Signs:  Patient profile:   55 year old male Weight:      326.50 pounds Temp:     99.2 degrees F oral Pulse rate:   76 / minute BP sitting:   106 / 70  (left arm) Cuff size:   large  Vitals Entered ByVernie Murders (Sep 11, 2008 4:25 PM)  O2 Sat on room air at rest %:  96  Physical Exam  Additional Exam:  Gen. Pleasant, well-nourished, in no distress, obese ENT - no lesions, no post nasal drip, class 3 airway Neck: No JVD, no thyromegaly, no carotid bruits Lungs: no use of accessory muscles, no dullness to percussion, clear without rales or rhonchi  Cardiovascular: Rhythm regular, heart sounds  normal, no murmurs or gallops, no peripheral edema Musculoskeletal: No deformities, no cyanosis or clubbing      Impression & Recommendations:  Problem # 1:  HYPERSOMNIA, ASSOCIATED WITH SLEEP APNEA (ICD-780.53) Assessment Improved  Good control with current auto BiPAP settings, will increase IPAP max level to 18. The pathophysiology of obstructive sleep apnea, it's cardiovascular consequences and modes of treatment including CPAP were discussed with the patient in great detail.  Emphasized compliance.  Orders: Est. Patient Level III (16109) DME Referral (DME)  Patient Instructions: 1)  Please schedule a follow-up appointment in 4 months.  Appended Document: f/u 1 month///kp let him know , reviewed compliance data, increase pressure slightly, Rx sent  Appended Document: f/u 1 month///kp lmomtcb  Appended Document: f/u 1 month///kp lmomtcb  Appended Document: f/u 1 month///kp Pt aware pressure will be increased  and order has already been sent.

## 2010-05-20 NOTE — Progress Notes (Signed)
Summary: hop---rx  Phone Note Call from Patient Call back at Home Phone [phone]   Caller: Patient Summary of Call: patient will pick up prilosec 20 mg  for one year mail order and one month too. Initial call taken by: Freddy Jaksch,  June 18, 2008 12:47 PM      Prescriptions: PRILOSEC 20 MG  CPDR (OMEPRAZOLE) 1 by mouth once daily  #30 x 0   Entered by:   Kandice Hams   Authorized by:   Marga Melnick MD   Signed by:   Kandice Hams on 06/18/2008   Method used:   Print then Give to Patient   RxID:   [id number] PRILOSEC 20 MG  CPDR (OMEPRAZOLE) 1 by mouth once daily  #90 x 3   Entered by:   Kandice Hams   Authorized by:   Marga Melnick MD   Signed by:   Kandice Hams on 06/18/2008   Method used:   Print then Give to Patient   RxID:   [id number]   Appended Document: hop---rx left msg for pt rx ready for pickup

## 2010-05-20 NOTE — Assessment & Plan Note (Signed)
Summary: ? uti or kidney infection- jr   Vital Signs:  Patient profile:   55 year old male Weight:      326 pounds Temp:     98.5 degrees F oral Pulse rate:   80 / minute Resp:     18 per minute BP sitting:   124 / 80  (left arm) Cuff size:   large  Vitals Entered By: Shonna Chock (March 06, 2009 4:31 PM) CC: Achy, back pain, burning when urinating, pressure and patient noticed a small amount of blood when urinating, Dysuria Comments REVIEWED MED LIST, PATIENT AGREED DOSE AND INSTRUCTION CORRECT    Primary Care Rin Gorton:  Dr Marga Melnick  CC:  Achy, back pain, burning when urinating, pressure and patient noticed a small amount of blood when urinating, and Dysuria.  History of Present Illness:  Dysuria      This is a 55 year old man who presents with  burning with urination, urinary frequency, urgency, and hematuria, but denies penile discharge.  He had fever & back pain on 11/13-15 with myalgias.L inguinal  and pelvic pain since 11/16.  The patient denies the following associated symptoms: nausea, vomiting, fever, shaking chills, flank pain, and abdominal pain.  Risk factors for urinary tract infection include diabetes.   FBS 160-180 with present illness; Aprida SSI initiated as per Dr Lawerance Bach, [facility].  Allergies: 1)  ! Morphine  Physical Exam  General:  in no acute distress; alert,appropriate and cooperative throughout examination Abdomen:  Bowel sounds positive,abdomen soft  slightly tender  suprapubic area without masses, organomegaly or hernias noted. Msk:  No flank tenderness to percussion. He sat up w/o help Extremities:  Negative SLR  Neurologic:  alert & oriented X3, strength normal in lower extremities, and DTRs symmetrical and normal.   Skin:  Intact without suspicious lesions or rashes Cervical Nodes:  No lymphadenopathy noted Axillary Nodes:  No palpable lymphadenopathy   Impression & Recommendations:  Problem # 1:  UTI (ICD-599.0)  Orders: T-Culture,  Urine ([id number])  His updated medication list for this problem includes:    Ciprofloxacin Hcl 500 Mg Tabs (Ciprofloxacin hcl) .[name]... 1 two times a day  Problem # 2:  DIABETES MELLITUS, TYPE II, UNCONTROLLED (ICD-250.02) EXcacerbation with present illness His updated medication list for this problem includes:    Metformin Hcl 1000 Mg Tabs (Metformin hcl) .[name]... 1 by mouth two times a day    Altace 10 Mg Tabs (Ramipril) .[name]... 1 by mouth qd    Lantus 100 Unit/ml Soln (Insulin glargine) .Marland KitchenMarland KitchenMarland KitchenMarland Kitchen 85 units daily    Bayer Aspirin 325 Mg Tabs (Aspirin) .[name]... 1 by mouth two times a day    Apidra 100 Unit/ml Soln (Insulin glulisine) .[name]... As needed  Complete Medication List: 1)  Metformin Hcl 1000 Mg Tabs (Metformin hcl) .[name].. 1 by mouth two times a day 2)  Altace 10 Mg Tabs (Ramipril) .[name].. 1 by mouth qd 3)  Prilosec 20 Mg Cpdr (Omeprazole) .[name].. 1 by mouth once daily 4)  Cetirizine Hcl 10 Mg Tabs (Cetirizine hcl) .... Take 1 tablet by mouth once a day 5)  Freestyle Lite Strp (Glucose blood) .... Test as directed 6)  Freestyle Lancets Misc (Lancets) .... Use as directed 7)  Simvastatin 5 Mg Tabs (Simvastatin) .[name].. 1 by mouth once daily 8)  Lantus 100 Unit/ml Soln (Insulin glargine) .... 85 units daily 9)  Bayer Aspirin 325 Mg Tabs (Aspirin) .[name].. 1 by mouth two times a day 10)  Fish Oil 2400mg  Caps (omega-3  Fatty Acids)  .[name].. 1 by mouth two times a day 11)  Fenofibrate 160 Mg Tabs (Fenofibrate) .... Take 1 tablet by mouth once a day 12)  Apidra 100 Unit/ml Soln (Insulin glulisine) .... As needed 13)  Ciprofloxacin Hcl 500 Mg Tabs (Ciprofloxacin hcl) .[name].. 1 two times a day  Other Orders: UA Dipstick W/ Micro (manual) (29528)  Patient Instructions: 1)  Consume LESS THAN 40 grams of sugar/ day from labeled foods & drinks(< 10 tsp/day) 2)  Drink as much fluid as you can tolerate for the next few days. Prescriptions: CIPROFLOXACIN HCL 500 MG TABS (CIPROFLOXACIN HCL) 1 two times a day  #20 x 0    Entered and Authorized by:   Marga Melnick MD   Signed by:   Marga Melnick MD on 03/06/2009   Method used:   Print then Give to Patient   RxID:   [id number]   Laboratory Results   Urine Tests   Date/Time Reported: March 06, 2009 4:01 PM   Routine Urinalysis   Color: yellow Appearance: Clear Glucose: negative   (Normal Range: Negative) Bilirubin: negative   (Normal Range: Negative) Ketone: negative   (Normal Range: Negative) Spec. Gravity: <1.005   (Normal Range: 1.003-1.035) Blood: negative   (Normal Range: Negative) pH: 7.5   (Normal Range: 5.0-8.0) Protein: negative   (Normal Range: Negative) Urobilinogen: negative   (Normal Range: 0-1) Nitrite: negative   (Normal Range: Negative) Leukocyte Esterace: negative   (Normal Range: Negative)    Comments: Floydene Flock  March 06, 2009 4:01 PM

## 2010-05-20 NOTE — Letter (Signed)
Summary: [facility]  [facility]   Imported By: Lanelle Bal 03/14/2008 15:28:07  _____________________________________________________________________  External Attachment:    Type:   Image     Comment:   External Document

## 2010-05-20 NOTE — Letter (Signed)
Summary: [facility]   Imported By: Freddy Jaksch 09/24/2006 10:52:13  _____________________________________________________________________  External Attachment:    Type:   Image     Comment:   [facility]

## 2010-05-22 ENCOUNTER — Telehealth (INDEPENDENT_AMBULATORY_CARE_PROVIDER_SITE_OTHER): Payer: Self-pay | Admitting: *Deleted

## 2010-05-22 NOTE — Assessment & Plan Note (Signed)
Summary: CPX/dt   Vital Signs:  Patient profile:   55 year old male Height:      68.25 inches (173.35 cm) Weight:      330.25 pounds (150.11 kg) O2 Sat:      97 % on Room air Temp:     98.2 degrees F (36.78 degrees C) oral Pulse rate:   86 / minute BP sitting:   111 / 73  (right arm) Cuff size:   large  Vitals Entered By: Josph Macho RMA (May 14, 2010 7:58 AM)  O2 Flow:  Room air CC: physical/ start 6 month diet plan for gastric bypass/ med refill/ CF Is Patient Diabetic? Yes   History of Present Illness: patient is a 55 year old Caucasian male in today for weight check. He is interested in gastric bypass and has had his initial consultation await for statins or nutritionist. I his insurance, [employer] and has requested that he have 6 months with checks and diet and exercise counseling prior to proceeding. He did not do since his consultation with nutritionist at [facility] he has been trying to hold her recommendations eating lean proteins, smaller and more frequent meals. Increase fresh foods and vegetables largely vegetables and a voiding most carbohydrates. Sugars have improved as a result he says his blood sugars tend to range in 110 and 150. He is dropped his Apidra to often just once daily only if his numbers are above 124 each meal continues his Lantus at 100 units daily. Denies any recent illness, fevers, chills, chest pain, palpitations, shortness of breath, GI or GU concerns at this time.  Current Medications (verified): 1)  Metformin Hcl 1000 Mg  Tabs (Metformin Hcl) .[name].. 1 By Mouth Two Times A Day 2)  Altace 10 Mg  Tabs (Ramipril) .[name].. 1 By Mouth Qd 3)  Prilosec 20 Mg  Cpdr (Omeprazole) .[name].. 1 By Mouth Once Daily 4)  Cetirizine Hcl 10 Mg Tabs (Cetirizine Hcl) .... Take 1 Tablet By Mouth Once A Day 5)  Freestyle Lite   Strp (Glucose Blood) .... Test As Directed 6)  Freestyle Lancets   Misc (Lancets) .... Use As Directed 7)  Simvastatin 5 Mg Tabs (Simvastatin) .[name].. 1  By Mouth Once Daily 8)  Lantus 100 Unit/ml Soln (Insulin Glargine) .[name].. 100 Units Daily 9)  Bayer Aspirin 325 Mg  Tabs (Aspirin) .[name].. 1 By Mouth Two Times A Day 10)  Fish Oil 2400mg   Caps (Omega-3 Fatty Acids) .[name].. 1 By Mouth Two Times A Day 11)  Fenofibrate 160 Mg Tabs (Fenofibrate) .... Take 1 Tablet By Mouth Once A Day 12)  Apidra 100 Unit/ml Soln (Insulin Glulisine) .... As Needed, Sliding Scale Three Times A Day With Meals and Qhs  Allergies (verified): 1)  ! Morphine  Past History:  Past medical history reviewed for relevance to current acute and chronic problems. Social history (including risk factors) reviewed for relevance to current acute and chronic problems.  Past Medical History: Reviewed history from 10/29/2008 and no changes required. frank DM 250.02 ACID REFLUX DISEASE (ICD-530.81) Sleep Apnea, CPAP , Dr Vassie Loll  Colonic polyps, hx of Diverticulosis, colon  Social History: Reviewed history from 03/27/2010 and no changes required. Former Smoker quit 1993.  Smoked x20 years.  Up to 3ppd ( actual consumption 1.5 ppd). Alcohol use-yes occasional Pt is married with children. Pt is employed as a Runner, broadcasting/film/video, Designer, fashion/clothing.  Diet : "not junky" Regular exercise-no Seat belts regularly  Review of Systems      See HPI  Physical Exam  General:  Well-developed,well-nourished,in no acute distress; alert,appropriate and cooperative throughout examination. Obese Head:  Normocephalic and atraumatic without obvious abnormalities. No apparent alopecia or balding. Mouth:  Oral mucosa and oropharynx without lesions or exudates.  Teeth in good repair. Neck:  No deformities, masses, or tenderness noted. Lungs:  Normal respiratory effort, chest expands symmetrically. Lungs are clear to auscultation, no crackles or wheezes. Heart:  Normal rate and regular rhythm. S1 and S2 normal without gallop, murmur, click, rub or other extra sounds. Abdomen:  Bowel sounds positive,abdomen soft  and non-tender without masses, organomegaly or hernias noted. Msk:  No deformity or scoliosis noted of thoracic or lumbar spine.   Extremities:  No clubbing, cyanosis, edema, or deformity noted with normal full range of motion of all joints.   Neurologic:  No cranial nerve deficits noted. Station and gait are normal. Plantar reflexes are down-going bilaterally. DTRs are symmetrical throughout. Sensory, motor and coordinative functions appear intact. Skin:  Intact without suspicious lesions or rashes Cervical Nodes:  No lymphadenopathy noted Psych:  Cognition and judgment appear intact. Alert and cooperative with normal attention span and concentration. No apparent delusions, illusions, hallucinations   Impression & Recommendations:  Problem # 1:  MORBID OBESITY (ICD-278.01) Patient is hoping to undergo Gastric Bypass Surgery at [facility] and has had his initial consultation with them. Since seeing them and meeting the nutritionist he has been trying to follow their advice. He is trying to avoid simple carbs, use lean proteins, eat small frequent meals. He has not started exercising to date. He is encouraged to eat small amoungts every 3-4 hours, use lean proteins, complex carbs, lots of fresh vegetables and increase activity level. Will check weight again next month.   Problem # 2:  DIABETES MELLITUS, TYPE II, UNCONTROLLED (ICD-250.02)  His updated medication list for this problem includes:    Metformin Hcl 1000 Mg Tabs (Metformin hcl) .[name]... 1 by mouth two times a day    Altace 10 Mg Tabs (Ramipril) .[name]... 1 by mouth qd    Lantus 100 Unit/ml Soln (Insulin glargine) .Marland KitchenMarland KitchenMarland KitchenMarland Kitchen 100 units daily    Bayer Aspirin 325 Mg Tabs (Aspirin) .[name]... 1 by mouth two times a day    Apidra 100 Unit/ml Soln (Insulin glulisine) .[name]... As needed, sliding scale three times a day with meals and qhs Since changing his diet his blood sugars are improving he is seeing numbers between 110 and 150 consistently. He has  been able to cut his Apidra down to just once a day just a few units. He continues with a 100units of Lantus  Problem # 3:  DYSURIA (ICD-788.1)  The following medications were removed from the medication list:    Ciprofloxacin Hcl 500 Mg Tabs (Ciprofloxacin hcl) .[name]... 1 by mouth q12hr    Pyridium 200 Mg Tabs (Phenazopyridine hcl) .[name]... 1 by mouth three times a day as needed pain Resolved, no concerning symptoms, maintain  adequate hydration.  Problem # 4:  ACID REFLUX DISEASE (ICD-530.81)  His updated medication list for this problem includes:    Prilosec 20 Mg Cpdr (Omeprazole) .[name]... 1 by mouth once daily Well controlled on the Prilosec and dietary changes, no c/o today.  Complete Medication List: 1)  Metformin Hcl 1000 Mg Tabs (Metformin hcl) .[name].. 1 by mouth two times a day 2)  Altace 10 Mg Tabs (Ramipril) .[name].. 1 by mouth qd 3)  Prilosec 20 Mg Cpdr (Omeprazole) .[name].. 1 by mouth once daily 4)  Cetirizine Hcl 10  Mg Tabs (Cetirizine hcl) .... Take 1 tablet by mouth once a day 5)  Freestyle Lite Strp (Glucose blood) .... Test as directed 6)  Freestyle Lancets Misc (Lancets) .... Use as directed 7)  Simvastatin 5 Mg Tabs (Simvastatin) .[name].. 1 by mouth once daily 8)  Lantus 100 Unit/ml Soln (Insulin glargine) .[name].. 100 units daily 9)  Bayer Aspirin 325 Mg Tabs (Aspirin) .[name].. 1 by mouth two times a day 10)  Fish Oil 2400mg  Caps (omega-3 Fatty Acids)  .[name].. 1 by mouth two times a day 11)  Fenofibrate 160 Mg Tabs (Fenofibrate) .... Take 1 tablet by mouth once a day 12)  Apidra 100 Unit/ml Soln (Insulin glulisine) .... As needed, sliding scale three times a day with meals and qhs   Orders Added: 1)  Est. Patient Level IV [13086]  Appended Document: CPX/dt Faxed medical record request to [facility] [phone]

## 2010-05-28 NOTE — Progress Notes (Signed)
Summary: Lab orders to be sent  Phone Note Call from Patient Call back at Work Phone [phone]   Caller: Patient Summary of Call: Pls advise pt when to go to get labwork done, he has scheduled an OV for 06/13/10 Initial call taken by: Lannette Donath,  May 22, 2010 10:48 AM  Follow-up for Phone Call        he needs laabs any time the week before his visit, he should get TSH, FLP, renal, lft, PSA, CBC check if he wants to go to [facility] [place] or out [place] [address] Follow-up by: Danise Edge MD,  May 22, 2010 11:39 AM  Additional Follow-up for Phone Call Additional follow up Details #1::        Pt informed.  Additional Follow-up by: Josph Macho RMA,  May 22, 2010 11:50 AM    Additional Follow-up for Phone Call Additional follow up Details #2::    Appt has been entered into Epic for [facility] lab Follow-up by: Lannette Donath,  May 22, 2010 3:21 PM

## 2010-05-30 ENCOUNTER — Encounter: Payer: Self-pay | Admitting: *Deleted

## 2010-06-02 ENCOUNTER — Other Ambulatory Visit: Payer: Self-pay

## 2010-06-05 NOTE — Letter (Signed)
Summary: [facility]  [facility]   Imported By: Kassie Mends 05/29/2010 09:23:53  _____________________________________________________________________  External Attachment:    Type:   Image     Comment:   External Document

## 2010-06-09 ENCOUNTER — Other Ambulatory Visit: Payer: PRIVATE HEALTH INSURANCE

## 2010-06-09 ENCOUNTER — Encounter (INDEPENDENT_AMBULATORY_CARE_PROVIDER_SITE_OTHER): Payer: Self-pay | Admitting: *Deleted

## 2010-06-09 ENCOUNTER — Other Ambulatory Visit: Payer: Self-pay | Admitting: Family Medicine

## 2010-06-09 DIAGNOSIS — E785 Hyperlipidemia, unspecified: Secondary | ICD-10-CM

## 2010-06-09 DIAGNOSIS — Z0000 Encounter for general adult medical examination without abnormal findings: Secondary | ICD-10-CM

## 2010-06-09 LAB — URINALYSIS
Bilirubin Urine: NEGATIVE
Hgb urine dipstick: NEGATIVE
Ketones, ur: NEGATIVE
Leukocytes, UA: NEGATIVE
Nitrite: NEGATIVE
Specific Gravity, Urine: <=1.005 (ref 1.000–1.030)
Total Protein, Urine: NEGATIVE
Urine Glucose: NEGATIVE
Urobilinogen, UA: 0.2 (ref 0.0–1.0)
pH: 6 (ref 5.0–8.0)

## 2010-06-09 LAB — BASIC METABOLIC PANEL
BUN: 14 mg/dL (ref 6–23)
CO2: 29 mEq/L (ref 19–32)
Calcium: 9.1 mg/dL (ref 8.4–10.5)
Chloride: 101 mEq/L (ref 96–112)
Creatinine, Ser: 0.9 mg/dL (ref 0.4–1.5)
GFR: 98.25 mL/min (ref >60.00)
Glucose, Bld: 180 mg/dL — ABNORMAL HIGH (ref 70–99)
Potassium: 4.4 mEq/L (ref 3.5–5.1)
Sodium: 138 mEq/L (ref 135–145)

## 2010-06-09 LAB — PSA: PSA: 0.67 ng/mL (ref 0.10–4.00)

## 2010-06-09 LAB — CBC WITH DIFFERENTIAL/PLATELET
Basophils Absolute: 0 10*3/uL (ref 0.0–0.1)
Basophils Relative: 0.4 % (ref 0.0–3.0)
Eosinophils Absolute: 0.1 10*3/uL (ref 0.0–0.7)
Eosinophils Relative: 2.5 % (ref 0.0–5.0)
HCT: 39 % (ref 39.0–52.0)
Hemoglobin: 13.6 g/dL (ref 13.0–17.0)
Lymphocytes Relative: 25.4 % (ref 12.0–46.0)
Lymphs Abs: 1.5 10*3/uL (ref 0.7–4.0)
MCHC: 34.8 g/dL (ref 30.0–36.0)
MCV: 86.1 fl (ref 78.0–100.0)
Monocytes Absolute: 0.5 10*3/uL (ref 0.1–1.0)
Monocytes Relative: 8.8 % (ref 3.0–12.0)
Neutro Abs: 3.8 10*3/uL (ref 1.4–7.7)
Neutrophils Relative %: 62.9 % (ref 43.0–77.0)
Platelets: 202 10*3/uL (ref 150.0–400.0)
RBC: 4.53 Mil/uL (ref 4.22–5.81)
RDW: 13.6 % (ref 11.5–14.6)
WBC: 6 10*3/uL (ref 4.5–10.5)

## 2010-06-09 LAB — HEPATIC FUNCTION PANEL
ALT: 34 U/L (ref 0–53)
AST: 34 U/L (ref 0–37)
Albumin: 4 g/dL (ref 3.5–5.2)
Alkaline Phosphatase: 44 U/L (ref 39–117)
Bilirubin, Direct: 0.2 mg/dL (ref 0.0–0.3)
Total Bilirubin: 0.6 mg/dL (ref 0.3–1.2)
Total Protein: 6.4 g/dL (ref 6.0–8.3)

## 2010-06-09 LAB — LIPID PANEL
Cholesterol: 147 mg/dL (ref 0–200)
HDL: 35.3 mg/dL — ABNORMAL LOW (ref >39.00)
Total CHOL/HDL Ratio: 4
Triglycerides: 224 mg/dL — ABNORMAL HIGH (ref 0.0–149.0)
VLDL: 44.8 mg/dL — ABNORMAL HIGH (ref 0.0–40.0)

## 2010-06-09 LAB — TSH: TSH: 2.02 u[IU]/mL (ref 0.35–5.50)

## 2010-06-09 LAB — LDL CHOLESTEROL, DIRECT: Direct LDL: 86.4 mg/dL

## 2010-06-11 ENCOUNTER — Encounter: Payer: Self-pay | Admitting: Family Medicine

## 2010-06-13 ENCOUNTER — Encounter: Payer: Self-pay | Admitting: Family Medicine

## 2010-06-13 ENCOUNTER — Ambulatory Visit (INDEPENDENT_AMBULATORY_CARE_PROVIDER_SITE_OTHER): Payer: PRIVATE HEALTH INSURANCE | Admitting: Family Medicine

## 2010-06-13 DIAGNOSIS — K219 Gastro-esophageal reflux disease without esophagitis: Secondary | ICD-10-CM

## 2010-06-13 DIAGNOSIS — R197 Diarrhea, unspecified: Secondary | ICD-10-CM

## 2010-06-13 DIAGNOSIS — IMO0001 Reserved for inherently not codable concepts without codable children: Secondary | ICD-10-CM

## 2010-06-13 HISTORY — DX: Diarrhea, unspecified: R19.7

## 2010-06-16 ENCOUNTER — Encounter: Payer: Self-pay | Admitting: *Deleted

## 2010-06-17 NOTE — Assessment & Plan Note (Signed)
Summary: fu bariatric surgery-dt   Vital Signs:  Patient profile:   55 year old male Height:      68.25 inches Weight:      323.50 pounds BMI:     49.00 O2 Sat:      96 % on Room air Pulse rate:   94 / minute BP sitting:   110 / 75  (right arm) Cuff size:   large  Vitals Entered By: Francee Piccolo CMA Duncan Dull) (June 13, 2010 8:28 AM)  O2 Flow:  Room air CC: follow-up visit for weight for bariatric surgery Is Patient Diabetic? Yes Did you bring your meter with you today? No   History of Present Illness: patient is an 55 year old Caucasian male in today for followup on obesity. He had been doing well until 2 days ago when he developed vomiting and diarrhea. He believes it was food poisoning he does eat some chicken that was ruptured the grocery store and immediately vomited twice. No further vomiting but he had persistent diarrhea since then 5 episodes yesterday and 2 already this morning has some abdominal cramping with the diarrhea but no persistent pain. No persistent nausea, fevers or chills. His appetite is returning slowly he has been drinking fluids. He reports his wife had a similar episode she could take an evening he vomited she chicken next day and vomited. Prior to this episode he been feeling well. He reports he recently had a hemoglobin A1c checked in January of 2012 which was 6.6. He reports his sugars hover around 140 and his sugars later in the day her to run between 110 and 130. He has been watching his diet well, he's been eating smaller portions, using more complex carbs in the proteins. He's been walking regularly and is pleased with his weight loss. Denies any other acute illness, fevers, chills, headache. Today he is struggling still with some fatigue secondary to his recent diarrhea and vomiting but denies muscle cramps, chest pain, palpitations, shortness of breath he  Current Medications (verified): 1)  Metformin Hcl 1000 Mg  Tabs (Metformin Hcl) .[name].. 1 By  Mouth Two Times A Day 2)  Altace 10 Mg  Tabs (Ramipril) .[name].. 1 By Mouth Qd 3)  Prilosec 20 Mg  Cpdr (Omeprazole) .[name].. 1 By Mouth Once Daily 4)  Cetirizine Hcl 10 Mg Tabs (Cetirizine Hcl) .... Take 1 Tablet By Mouth Once A Day 5)  Freestyle Lite   Strp (Glucose Blood) .... Test As Directed 6)  Freestyle Lancets   Misc (Lancets) .... Use As Directed 7)  Simvastatin 5 Mg Tabs (Simvastatin) .[name].. 1 By Mouth Once Daily 8)  Lantus 100 Unit/ml Soln (Insulin Glargine) .[name].. 100 Units Daily 9)  Bayer Aspirin 325 Mg  Tabs (Aspirin) .[name].. 1 By Mouth Two Times A Day 10)  Fish Oil 2400mg   Caps (Omega-3 Fatty Acids) .[name].. 1 By Mouth Two Times A Day 11)  Fenofibrate 160 Mg Tabs (Fenofibrate) .... Take 1 Tablet By Mouth Once A Day 12)  Apidra 100 Unit/ml Soln (Insulin Glulisine) .... As Needed, Sliding Scale Three Times A Day With Meals and Qhs  Allergies (verified): 1)  ! Morphine  Past History:  Past medical history reviewed for relevance to current acute and chronic problems. Social history (including risk factors) reviewed for relevance to current acute and chronic problems.  Past Medical History: Reviewed history from 10/29/2008 and no changes required. frank DM 250.02 ACID REFLUX DISEASE (ICD-530.81) Sleep Apnea, CPAP , Dr Vassie Loll  Colonic polyps, hx of Diverticulosis,  colon  Social History: Reviewed history from 03/27/2010 and no changes required. Former Smoker quit 1993.  Smoked x20 years.  Up to 3ppd ( actual consumption 1.5 ppd). Alcohol use-yes occasional Pt is married with children. Pt is employed as a Runner, broadcasting/film/video, Designer, fashion/clothing.  Diet : "not junky" Regular exercise-no Seat belts regularly  Review of Systems      See HPI  Physical Exam  General:  Well-developed,well-nourished,in no acute distress; alert,appropriate and cooperative throughout examination Head:  Normocephalic and atraumatic without obvious abnormalities. No apparent alopecia or balding. Mouth:  Oral mucosa and  oropharynx without lesions or exudates.  Teeth in good repair. Neck:  No deformities, masses, or tenderness noted. Lungs:  Normal respiratory effort, chest expands symmetrically. Lungs are clear to auscultation, no crackles or wheezes. Heart:  Normal rate and regular rhythm. S1 and S2 normal without gallop, murmur, click, rub or other extra sounds.   Impression & Recommendations:  Problem # 1:  MORBID OBESITY (ICD-278.01) Patient has had good weight loss since last visit. Encouraged to continue with his walking and dietary changes. Avoid trans fats, eat small frequent meals, minimize caloric intake and reassess in one month  Problem # 2:  DIARRHEA (ICD-787.91) Food Poisoning vs Gastroenteritis, improving encouraged increased fluids with Gatorade and ginger ale, use BRAT diet and advance as tolerated. Call if symptoms worsen.   Problem # 3:  DIABETES MELLITUS, TYPE II, UNCONTROLLED (ICD-250.02)  The following medications were removed from the medication list:    Altace 10 Mg Tabs (Ramipril) .[name]... 1 by mouth qd His updated medication list for this problem includes:    Metformin Hcl 1000 Mg Tabs (Metformin hcl) .[name]... 1 by mouth two times a day    Lantus 100 Unit/ml Soln (Insulin glargine) .Marland KitchenMarland KitchenMarland KitchenMarland Kitchen 100 units daily    Bayer Aspirin 325 Mg Tabs (Aspirin) .[name]... 1 by mouth two times a day    Apidra 100 Unit/ml Soln (Insulin glulisine) .[name]... As needed, sliding scale three times a day with meals and qhs    Altace 10 Mg Caps (Ramipril) .[name]... 1 cap by mouth daily Patient reports recent A1C of 6.6 no change in meds today and refill sent on Altace  Problem # 4:  ACID REFLUX DISEASE (ICD-530.81)  His updated medication list for this problem includes:    Prilosec 20 Mg Cpdr (Omeprazole) .[name]... 1 by mouth once daily Well controlled, avoid offending foods and refills sent today   Complete Medication List: 1)  Metformin Hcl 1000 Mg Tabs (Metformin hcl) .[name].. 1 by mouth two times a day 2)  Prilosec 20 Mg  Cpdr (Omeprazole) .[name].. 1 by mouth once daily 3)  Cetirizine Hcl 10 Mg Tabs (Cetirizine hcl) .... Take 1 tablet by mouth once a day 4)  Freestyle Lite Strp (Glucose blood) .... Test as directed 5)  Freestyle Lancets Misc (Lancets) .... Use as directed 6)  Simvastatin 5 Mg Tabs (Simvastatin) .[name].. 1 by mouth once daily 7)  Lantus 100 Unit/ml Soln (Insulin glargine) .[name].. 100 units daily 8)  Bayer Aspirin 325 Mg Tabs (Aspirin) .[name].. 1 by mouth two times a day 9)  Fish Oil 2400mg  Caps (omega-3 Fatty Acids)  .[name].. 1 by mouth two times a day 10)  Fenofibrate 160 Mg Tabs (Fenofibrate) .... Take 1 tablet by mouth once a day 11)  Apidra 100 Unit/ml Soln (Insulin glulisine) .... As needed, sliding scale three times a day with meals and qhs 12)  Altace 10 Mg Caps (Ramipril) .[name].. 1 cap by mouth daily  Patient Instructions: 1)  Please schedule a follow-up appointment in 1 month.  2)  The main problem with gastroentereritis or food poisoning is dehydration. Drink plenty of fluids and take solids as you feel better. If you are unable to keep anything down and/or you show signs of dehydration( dry cracked lips, lack of tears, not urinating, very sleepy) , call our office.  3)  Start Benefiber 2 tsp by mouth two times a day until diarrhea resolves. Rehydrate today with Gatorade, Sprite and/or Ginger Ale 4)  Follow BRAT diet today and then liberalize diet as tolerated. Call or seek care if symptoms worsen or fevers develop Prescriptions: PRILOSEC 20 MG  CPDR (OMEPRAZOLE) 1 by mouth once daily  #90 x 3   Entered and Authorized by:   Danise Edge MD   Signed by:   Danise Edge MD on 06/13/2010   Method used:   Faxed to ...       [facility])       [address] [address]       [place], [place]  [address]       Ph: [phone]       Fax: [phone]   RxID:   [id number] ALTACE 10 MG CAPS (RAMIPRIL) 1 cap by mouth daily  #90 x 3   Entered and Authorized by:   Danise Edge MD   Signed by:   Danise Edge MD  on 06/13/2010   Method used:   Faxed to ...       [facility])       [address] [address]       [place], [place]  [address]       Ph: [phone]       Fax: [phone]   RxID:   [id number]    Orders Added: 1)  Est. Patient Level IV [17510]

## 2010-06-26 NOTE — Letter (Signed)
Summary: Celso Sickle MD/[facility]  Celso Sickle MD/[facility]   Imported By: Lester  06/20/2010 07:45:55  _____________________________________________________________________  External Attachment:    Type:   Image     Comment:   External Document

## 2010-06-30 ENCOUNTER — Encounter: Payer: Self-pay | Admitting: *Deleted

## 2010-07-11 ENCOUNTER — Encounter: Payer: Self-pay | Admitting: Family Medicine

## 2010-07-11 ENCOUNTER — Ambulatory Visit (INDEPENDENT_AMBULATORY_CARE_PROVIDER_SITE_OTHER): Payer: PRIVATE HEALTH INSURANCE | Admitting: Family Medicine

## 2010-07-11 VITALS — BP 138/82 | HR 92 | Temp 98.1°F | Ht 68.25 in | Wt 330.4 lb

## 2010-07-11 DIAGNOSIS — IMO0001 Reserved for inherently not codable concepts without codable children: Secondary | ICD-10-CM

## 2010-07-11 DIAGNOSIS — L259 Unspecified contact dermatitis, unspecified cause: Secondary | ICD-10-CM

## 2010-07-11 DIAGNOSIS — L309 Dermatitis, unspecified: Secondary | ICD-10-CM

## 2010-07-11 DIAGNOSIS — R030 Elevated blood-pressure reading, without diagnosis of hypertension: Secondary | ICD-10-CM

## 2010-07-11 MED ORDER — MOMETASONE FUROATE 0.1 % EX CREA: TOPICAL_CREAM | CUTANEOUS | Status: AC

## 2010-07-11 NOTE — Patient Instructions (Signed)
Stinging Nettle Rash Nettle is a broad leafed plant that causes a hive -like rash and burning (urticaria) after being touched. The rash is caused by hairs on the leaves of the nettle plant. When you brush past the plant, these tiny hairs break off in your skin and release formic acid into your skin. This also causes histamine to be released by cells in the skin. Both of these chemicals are irritating to the skin. Both chemicals contribute to the rash. The rash is from a build-up of fluid in the skin.  In severe cases, this may be followed by swelling in other parts of the body as well. If swelling occurs around the eyes, lips, tongue, larynx or hands it is called angioneurotic oedema. Any allergic rash, such as urticaria or hives, may be called nettle rash even though they may not be from the nettle plant. SYMPTOMS Some of the problems caused are:  A burning or stinging sensation.   Red itchy swelling (wheals).   Itchy, white bumps that can last up to 24 hours but can occur repeatedly.   Cramps, diarrhea and wheezing can happen with a severe allergic reaction.  DIAGNOSIS The diagnosis of nettle rash is made easily by the history of coming in contact with stinging nettle. You usually know about the contact immediately from symptoms produced by the plant. Your caregiver will also know from the history given and by examining you. TREATMENT  Mild cases can generally be treated at home. In more severe cases, you should consult your caregiver.   As soon as you can find water, wash the exposed area. The discomfort generally goes away almost immediately.   You may use an over-the-counter antihistamine (diphenhydramine) for hives and itching as needed. Do not drive or drink alcohol until medications used to treat the reaction have worn off. Antihistamines tend to make people sleepy.   Apply cold cloths (compresses) to the skin or take baths in cool water. This will help itching. Avoid hot baths or  showers. Heat will make the rash and itching worse.   A paste made of baking soda and water applied to the rash may help relieve itching.   Calamine lotion and/or aloe vera applied to rash will give relief. Sunscreen with aloe vera can be used.  SEEK IMMEDIATE MEDICAL CARE IF YOU DEVELOP:  Difficulty breathing, wheezing, or you have a tight feeling in your chest or throat.   A swollen mouth or have hives, swelling, or itching all over your body.   A severe reaction with any of the above problems should be considered life threatening.  If you suddenly develop difficulty breathing, call for local emergency medical help. THIS IS AN EMERGENCY. Document Released: 02/01/2007  [facility] Patient Information 2011 [place], [place].Obesity Obesity is defined as having a Body Mass Index (BMI) of 30 or more. To calculate your BMI divide your weight in pounds by your height in inches squared and multiply that product by 703. Major illnesses resulting from long-term obesity include:  Stroke.   Heart disease.   Diabetes.   Many cancers.   Arthritis.  Obesity also complicates recovery from many other medical problems.  CAUSES  A history of obesity in your parents.   Thyroid hormone imbalance.   Environmental factors such as excess calorie intake and physical inactivity.  TREATMENT A healthy weight loss program includes:  A calorie restricted diet based on individual calorie needs.   Increased physical activity (exercise).  An exercise program is just as important as  the right low calorie diet.  Weight-loss medicines should be used only under the supervision of your physician. These medicines help, but only if they are used with diet and exercise programs. Medicines can have side effects including nervousness, nausea, abdominal pain, diarrhea, headache, drowsiness, and depression.  An unhealthy weight loss program includes:  Fasting.   Fad diets.   Supplements and drugs.  These choices  do not succeed in long-term weight control.  HOME CARE INSTRUCTIONS To help you make the needed dietary changes:   Keep a daily record of everything you eat. There are many free websites to help you with this. It may be helpful to measure your foods so you can determine if you are eating the correct portion sizes.   Use low-calorie cookbooks or take special cooking classes.   Avoid alcohol. Drink more water and drinks with no calories.   Take vitamins and supplements only as recommended by your caregiver.   Weight-loss support groups, Optometrist, counselors, and stress reduction education can also be very helpful.  PREVENTION Losing weight and keeping it off takes time, discipline, a healthy diet and regular exercise. Document Released: 05/14/2004 Document Re-Released: 04/26/2007 [facility] Patient Information 2011 [place], [place].May take Zyrtec 10 mg po twice daily for rash and cleanse area with [facility] Astringent

## 2010-07-14 ENCOUNTER — Encounter: Payer: Self-pay | Admitting: Family Medicine

## 2010-07-14 DIAGNOSIS — I10 Essential (primary) hypertension: Secondary | ICD-10-CM

## 2010-07-14 HISTORY — DX: Essential (primary) hypertension: I10

## 2010-07-14 NOTE — Assessment & Plan Note (Signed)
Mild elevation secondary to hi stress will continue to monitor and patient should minimize sodium intake

## 2010-07-14 NOTE — Progress Notes (Signed)
  Subjective:    Patient ID: Brent [name], male    DOB: [birth year], 55 y.o.   MRN: [id number]  HPI  Patient is a 55 year old Caucasian male who is in today for a obesity. He has done in an adequate job of maintaining a good diet or exercising this month to the tragic loss of his brother. His brother died unexpectedly 2 weeks ago when the patient is having a hard time adjusting.  His brother had suffered from many of the same medical problems the patient himself suffers from. Patient is tearful during the visit and he belches he has been eating poorly sleeping more and not exercising as a result of his stress and sadness. Sugars have been running higher than usual in the high 100s to low 200s. He denies any recent illness/fevers/chills/CP/palp/SOB/GI or GU c/o.    Review of Systems  Constitutional: Positive for appetite change and fatigue. Negative for fever, chills and activity change.  HENT: Negative for congestion, sneezing and neck pain.   Eyes: Negative for itching.  Respiratory: Negative for apnea, cough, chest tightness, shortness of breath and wheezing.   Cardiovascular: Negative for chest pain and palpitations.  Gastrointestinal: Negative for nausea, abdominal pain, diarrhea, constipation and abdominal distention.  Genitourinary: Negative for dysuria and urgency.  Musculoskeletal: Negative for joint swelling.  Skin: Negative for rash.  Neurological: Negative for dizziness and syncope.  Psychiatric/Behavioral: Negative for suicidal ideas, self-injury and agitation. The patient is not nervous/anxious and is not hyperactive.        [Patient tearful today because his brother died somewhat unexpectedly about 2 weeks ago. His brother suffered with many of the same medical conditions the patient does and the patinet is having a hard time adjusting to the loss      Objective:   Physical Exam  Constitutional: He is oriented to person, place, and time. He appears well-developed and  well-nourished. No distress.       Obese  HENT:  Head: Normocephalic and atraumatic.  Mouth/Throat: Oropharynx is clear and moist.  Eyes: Conjunctivae are normal.  Neck: Normal range of motion. Neck supple.  Cardiovascular: Normal rate, regular rhythm and normal heart sounds.   Pulmonary/Chest: Effort normal and breath sounds normal.  Abdominal: Bowel sounds are normal.  Musculoskeletal: Normal range of motion.  Neurological: He is alert and oriented to person, place, and time.  Skin: Skin is warm and dry.  Psychiatric: Judgment and thought content normal.       Depressed mood and tearful          Assessment & Plan:  Morbid obesity Patient is encouraged to try and rein in his overeating again. He understands this is necessary but is feeling overwhelmed at the present time. Encouraged small, frequent meals and increased activity levels again  DIABETES MELLITUS, TYPE II, UNCONTROLLED Avoid simple carbs and eat small, frequent meals, continue to monitor closely and use current meds as directed  Elevated BP Mild elevation secondary to hi stress will continue to monitor and patient should minimize sodium intake

## 2010-07-14 NOTE — Assessment & Plan Note (Signed)
Avoid simple carbs and eat small, frequent meals, continue to monitor closely and use current meds as directed

## 2010-07-14 NOTE — Assessment & Plan Note (Signed)
Patient is encouraged to try and rein in his overeating again. He understands this is necessary but is feeling overwhelmed at the present time. Encouraged small, frequent meals and increased activity levels again

## 2010-07-27 LAB — URINALYSIS, ROUTINE W REFLEX MICROSCOPIC
Bilirubin Urine: NEGATIVE
Glucose, UA: NEGATIVE mg/dL
Hgb urine dipstick: NEGATIVE
Ketones, ur: NEGATIVE mg/dL
Nitrite: NEGATIVE
Protein, ur: NEGATIVE mg/dL
Specific Gravity, Urine: 1.011 (ref 1.005–1.030)
Urobilinogen, UA: 0.2 mg/dL (ref 0.0–1.0)
pH: 5.5 (ref 5.0–8.0)

## 2010-07-27 LAB — COMPREHENSIVE METABOLIC PANEL
ALT: 44 U/L (ref 0–53)
AST: 43 U/L — ABNORMAL HIGH (ref 0–37)
Albumin: 4 g/dL (ref 3.5–5.2)
Alkaline Phosphatase: 39 U/L (ref 39–117)
BUN: 11 mg/dL (ref 6–23)
CO2: 24 mEq/L (ref 19–32)
Calcium: 9 mg/dL (ref 8.4–10.5)
Chloride: 108 mEq/L (ref 96–112)
Creatinine, Ser: 0.86 mg/dL (ref 0.4–1.5)
GFR calc Af Amer: >60 mL/min (ref >60)
GFR calc non Af Amer: >60 mL/min (ref >60)
Glucose, Bld: 112 mg/dL — ABNORMAL HIGH (ref 70–99)
Potassium: 4 mEq/L (ref 3.5–5.1)
Sodium: 138 mEq/L (ref 135–145)
Total Bilirubin: 0.6 mg/dL (ref 0.3–1.2)
Total Protein: 6.2 g/dL (ref 6.0–8.3)

## 2010-07-27 LAB — CBC
HCT: 39.2 % (ref 39.0–52.0)
Hemoglobin: 12.9 g/dL — ABNORMAL LOW (ref 13.0–17.0)
MCHC: 33 g/dL (ref 30.0–36.0)
MCV: 85.9 fL (ref 78.0–100.0)
Platelets: 232 10*3/uL (ref 150–400)
RBC: 4.56 MIL/uL (ref 4.22–5.81)
RDW: 13.7 % (ref 11.5–15.5)
WBC: 7.3 10*3/uL (ref 4.0–10.5)

## 2010-07-27 LAB — DIFFERENTIAL
Basophils Absolute: 0 10*3/uL (ref 0.0–0.1)
Basophils Relative: 1 % (ref 0–1)
Eosinophils Absolute: 0.1 10*3/uL (ref 0.0–0.7)
Eosinophils Relative: 2 % (ref 0–5)
Lymphocytes Relative: 21 % (ref 12–46)
Lymphs Abs: 1.5 10*3/uL (ref 0.7–4.0)
Monocytes Absolute: 0.5 10*3/uL (ref 0.1–1.0)
Monocytes Relative: 7 % (ref 3–12)
Neutro Abs: 5.1 10*3/uL (ref 1.7–7.7)
Neutrophils Relative %: 70 % (ref 43–77)

## 2010-07-27 LAB — LIPASE, BLOOD: Lipase: 22 U/L (ref 11–59)

## 2010-08-12 ENCOUNTER — Ambulatory Visit (INDEPENDENT_AMBULATORY_CARE_PROVIDER_SITE_OTHER): Payer: PRIVATE HEALTH INSURANCE | Admitting: Family Medicine

## 2010-08-12 ENCOUNTER — Encounter: Payer: Self-pay | Admitting: Family Medicine

## 2010-08-12 DIAGNOSIS — R030 Elevated blood-pressure reading, without diagnosis of hypertension: Secondary | ICD-10-CM

## 2010-08-12 DIAGNOSIS — G4730 Sleep apnea, unspecified: Secondary | ICD-10-CM

## 2010-08-12 DIAGNOSIS — G4710 Hypersomnia, unspecified: Secondary | ICD-10-CM

## 2010-08-12 DIAGNOSIS — K219 Gastro-esophageal reflux disease without esophagitis: Secondary | ICD-10-CM

## 2010-08-12 DIAGNOSIS — IMO0001 Reserved for inherently not codable concepts without codable children: Secondary | ICD-10-CM

## 2010-08-12 DIAGNOSIS — R197 Diarrhea, unspecified: Secondary | ICD-10-CM

## 2010-08-13 ENCOUNTER — Encounter: Payer: Self-pay | Admitting: Family Medicine

## 2010-08-13 NOTE — Assessment & Plan Note (Signed)
Patient continues to be under a great deal of stress and admits to not always eating right and exercising. He is still recovering from his brother's death and now his brother in law has just died unexpectedly. He reports trying to avoid simple carbs and fatty foods and try to at least get a daily walk.

## 2010-08-13 NOTE — Progress Notes (Signed)
Brent [name] [id number] November 26, 1955 08/13/2010      Progress Note-Follow Up  Subjective  Chief Complaint  Chief Complaint  Patient presents with  . Follow-up    Gastro bypass surgery    HPI  Patient is a 55 year old Caucasian male in today for monthly weight check. He has been under a great deal of stress secondary to recent death of his brother-in-law after recently losing his brother as well. Overall he feels he is having well and he feels slightly less emotional and labile than he did at his last visit. He denied any recent illness, fevers, chills, chest pain, palpitations, shortness of breath, GI or GU complaints. The diarrhea is somewhat improved try to get some probiotics and fiber at times. He's not struggling with a green rhinorrhea but does have some ongoing mild nasal congestion for which he takes Zyrtec. His reflux is well controlled on omeprazole and he is avoiding foods. He denies any headaches or urinary symptoms at this time.  Past Medical History  Diagnosis Date  . DM (diabetes mellitus), type 2, uncontrolled   . Acid reflux disease   . Sleep apnea     CPAP  . Hx of colonic polyps   . Diverticulosis of colon   . Elevated BP 07/14/2010  . Morbid obesity 03/27/2010  . DIABETES MELLITUS, TYPE II, UNCONTROLLED 07/05/2007  . 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 HYPERSOMNIA, ASSOCIATED WITH SLEEP APNEA 07/26/2008  . DIVERTICULOSIS, COLON 10/29/2008  . Diarrhea 06/13/2010  . COLONIC POLYPS, HX OF 10/29/2008  . Benign neoplasm of colon 07/05/2007  . ACID REFLUX DISEASE 07/05/2007    Past Surgical History  Procedure Date  . Ankle surgery 1994  . Tonsillectomy age 73  . Colonoscopy polyps     Family History  Problem Relation Age of Onset  . Diabetes Mother   . Hypertension Mother   . Stroke Mother   . Hyperlipidemia Mother   . Hypertension Father   . Colon polyps Father   . Heart attack Father 89  . Stroke Father   . Heart attack Brother   . Diabetes Brother     . Heart attack Brother     Multiple  . Diabetes Brother   . Other Brother     heart problems  . Diabetes Sister   . Obesity Brother   . Heart disease Brother   . Hypertension Maternal Grandmother   . ADD / ADHD Daughter     History   Social History  . Marital Status: Married    Spouse Name: N/A    Number of Children: N/A  . Years of Education: N/A   Occupational History  . Not on file.   Social History Main Topics  . Smoking status: Former Smoker -- 1.5 packs/day for 20 years    Types: Cigarettes    Quit date: 04/21/1991  . Smokeless tobacco: Never Used  . Alcohol Use: 0.0 oz/week    0 drink(s) per week      occasionaly- social  . Drug Use: No  . Sexually Active: Yes   Other Topics Concern  . Not on file   Social History Narrative  . No narrative on file    Current Outpatient Prescriptions on File Prior to Visit  Medication Sig Dispense Refill  . aspirin 325 MG tablet Take 325 mg by mouth 2 (two) times daily.        . cetirizine (ZYRTEC) 10 MG tablet Take 10 mg by mouth daily.        [name]  fenofibrate 160 MG tablet Take 160 mg by mouth daily.        . fish oil-omega-3 fatty acids 1000 MG capsule Take 2,400 mg by mouth 2 (two) times daily.        [name] glucose blood (FREESTYLE LITE) test strip 1 each by Other route as directed. Use as instructed       . insulin glargine (LANTUS) 100 UNIT/ML injection Inject 100 Units into the skin daily.        . Lancets (FREESTYLE) lancets 1 each by Other route as directed. Use as instructed       . metFORMIN (GLUMETZA) 1000 MG (MOD) 24 hr tablet Take 1,000 mg by mouth 2 (two) times daily.        . mometasone (ELOCON) 0.1 % cream Apply to affected area daily  45 g  1  . omeprazole (PRILOSEC) 20 MG capsule Take 20 mg by mouth daily.        . phenazopyridine (PYRIDIUM) 200 MG tablet Take 200 mg by mouth 3 (three) times daily as needed. For pain       . ramipril (ALTACE) 10 MG tablet Take 10 mg by mouth daily.        . simvastatin  (ZOCOR) 5 MG tablet Take 5 mg by mouth daily.        . ciprofloxacin (CIPRO XR) 500 MG 24 hr tablet Take 500 mg by mouth every 12 (twelve) hours.        . insulin glulisine (APIDRA) 100 UNIT/ML injection Inject into the skin TID-BC PRN. Sliding scale prn with meals and qhs         Allergies  Allergen Reactions  . Morphine     Review of Systems  Review of Systems  Constitutional: Negative for fever and malaise/fatigue.  HENT: Negative for congestion.   Eyes: Negative for discharge.  Respiratory: Negative for shortness of breath.   Cardiovascular: Negative for chest pain, palpitations and leg swelling.  Gastrointestinal: Negative for nausea, abdominal pain and diarrhea.  Genitourinary: Negative for dysuria.  Musculoskeletal: Negative for falls.  Skin: Negative for rash.  Neurological: Negative for loss of consciousness and headaches.  Endo/Heme/Allergies: Negative for polydipsia.  Psychiatric/Behavioral: Negative for depression and suicidal ideas. The patient is not nervous/anxious and does not have insomnia.     Objective  BP 134/85  Pulse 89  Temp(Src) 98.4 F (36.9 C) (Oral)  Ht 5' 8.25" (1.734 m)  Wt 326 lb 1.9 oz (147.927 kg)  BMI 49.22 kg/m2  SpO2 96%  Physical Exam  Physical Exam  Constitutional: He is oriented to person, place, and time and well-developed, well-nourished, and in no distress. No distress.  HENT:  Head: Normocephalic and atraumatic.  Eyes: Conjunctivae are normal.  Neck: Neck supple. No thyromegaly present.  Cardiovascular: Normal rate, regular rhythm and normal heart sounds.   No murmur heard. Pulmonary/Chest: Effort normal and breath sounds normal. No respiratory distress.  Abdominal: He exhibits no distension and no mass. There is no tenderness.  Musculoskeletal: He exhibits no edema.  Neurological: He is alert and oriented to person, place, and time.  Skin: Skin is warm.  Psychiatric: Memory, affect and judgment normal.    Lab Results   Component Value Date   TSH 2.02 06/09/2010   Lab Results  Component Value Date   WBC 6.0 06/09/2010   HGB 13.6 06/09/2010   HCT 39.0 06/09/2010   MCV 86.1 06/09/2010   PLT 202.0 06/09/2010   Lab Results  Component Value Date  CREATININE 0.9 06/09/2010   BUN 14 06/09/2010   NA 138 06/09/2010   K 4.4 06/09/2010   CL 101 06/09/2010   CO2 29 06/09/2010   Lab Results  Component Value Date   ALT 34 06/09/2010   AST 34 06/09/2010   ALKPHOS 44 06/09/2010   BILITOT 0.6 06/09/2010   Lab Results  Component Value Date   CHOL 147 06/09/2010   Lab Results  Component Value Date   HDL 35.30* 06/09/2010   No results found for this basename: LDLCALC   Lab Results  Component Value Date   TRIG 224.0* 06/09/2010   Lab Results  Component Value Date   CHOLHDL 4 06/09/2010     Assessment & Plan  Morbid obesity Patient continues to be under a great deal of stress and admits to not always eating right and exercising. He is still recovering from his brother's death and now his brother in law has just died unexpectedly. He reports trying to avoid simple carbs and fatty foods and try to at least get a daily walk.  DIABETES MELLITUS, TYPE II, UNCONTROLLED Sees an endocrinologist and is due to have his hgba1c drawn next month, agrees to have the results of his labs forwarded to [place] for review so we can help him monitor his disease and treat any acute situations that might arise appropriately  HYPERSOMNIA, ASSOCIATED WITH SLEEP APNEA Needs to continue on Bipap and will need follow up with pulmonology in the future due tot the severity of disease  Elevated BP Hi normal but improved control. Encouraged avoidance of sodium and caffeine, good sleep and pursue weight loss  DIARRHEA Improved with increased fiber and probiotics, using them intermittently no changes  ACID REFLUX DISEASE No complaints on Omeprazole, avoid offending foods and pursue weight loss

## 2010-08-13 NOTE — Assessment & Plan Note (Signed)
Needs to continue on Bipap and will need follow up with pulmonology in the future due tot the severity of disease

## 2010-08-13 NOTE — Assessment & Plan Note (Signed)
Hi normal but improved control. Encouraged avoidance of sodium and caffeine, good sleep and pursue weight loss

## 2010-08-13 NOTE — Assessment & Plan Note (Signed)
Improved with increased fiber and probiotics, using them intermittently no changes

## 2010-08-13 NOTE — Assessment & Plan Note (Signed)
Sees an endocrinologist and is due to have his hgba1c drawn next month, agrees to have the results of his labs forwarded to [place] for review so we can help him monitor his disease and treat any acute situations that might arise appropriately

## 2010-08-13 NOTE — Assessment & Plan Note (Signed)
No complaints on Omeprazole, avoid offending foods and pursue weight loss

## 2010-09-02 NOTE — Procedures (Signed)
NAME:  Brent [name], Brent [name]               ACCOUNT NO.:  [id number]   MEDICAL RECORD NO.:  [id number]          PATIENT TYPE:  OUT   LOCATION:  SLEEP CENTER                 FACILITY:  [facility]   PHYSICIAN:  [name], MD      DATE OF BIRTH:  [birth year]   DATE OF STUDY:  07/26/2008                            NOCTURNAL POLYSOMNOGRAM   REFERRING PHYSICIAN:   INDICATION FOR STUDY:  Excessive daytime fatigue, nonrefreshing sleep,  loud snoring, and witnessed apneas in this obese gentleman with a weight  of 300 pounds, height 5 feet 9 inches, BMI 44, Epworth sleepiness score  of 14.   This overnight polysomnogram was performed with a sleep technologist in  attendance.  EEG, EOG, EMG, EKG, respiratory parameters and limb  movements were recorded.  Sleep stages arousals, limb movements, and  respiratory data were scored according to criteria laid out by the  American Academy of Sleep Medicine.   EPWORTH SLEEPINESS SCORE:  14.   MEDICATIONS:   SLEEP ARCHITECTURE:  Lights out was at 9:55 p.m., lights on was at 3:54  a.m.  Intervention was initiated at 11 minutes past midnight.  During  the diagnostic portion, total sleep time was 119.5 minutes with a sleep  period time of 123.5 minutes and a sleep efficiency of 87.5, and a sleep  maintenance efficiency of 90%.  Sleep stages as a percentage of total  sleep time was N1 6%, N2 84%, N3 0%, and REM sleep 9.6% (11.5 minutes).  Sleep latency was 13 minutes.  Unfortunately, during the titration  portion only 40 minutes of sleep were obtained with 52% stage N2.  No  REM sleep or slow-wave sleep was noted.   RESPIRATORY DATA:  During the diagnostic portion, there were a total of  1 obstructive apnea, 1 central apnea, 0 mixed apneas, and 63 hypopneas  with an apnea-hypopnea index of 32.6 events per hour, 2  were also  noted.  The longest hypopnea was 42 seconds.   Due to this degree of respiratory disturbance, CPAP was initiated at 5  cm at this level  for 16 minutes, 25 central apneas emerged with a low  desaturation of 85%.  Hence, he was switched over to a BiPAP of 8/4 cm  at a level of which was increased to 9/4 cm at this level for 19  minutes.  There were 26 central apneas, 1 obstructive apnea, 2 mixed  apneas, and a low desaturation of 81%, had a level of 9/5 cm for 5.5  minutes of sleep.  Five central apneas and 1 mixed apnea was noted with  a low desaturation of 82%.  Unfortunately, the patient did not sleep  much at this level and the study was terminated.   AROUSAL DATA:  Arousal index during the diagnostic portion was 30 events  per hour.  Arousals persisted even at the final level of BiPAP.   LIMB MOVEMENT DATA:  No significant limb movements were noted.   OXYGEN DESATURATION DATA:  The lowest desaturation during the diagnostic  portion was 84% and the desaturation index was 40 events per hour.  During the titration portion, the lowest desaturation was  81% on BiPAP  level of 9/4.   CARDIAC DATA:  The low heart rate was 43 beats per minute during non-REM  sleep.  The high heart rate recorded was an artifact.  Few APCs were  noted.   MOVEMENT-PARASOMNIA:  none   DISCUSSION:  Central apneas seem to emerge at low positive-airway  pressure level indicating complex sleep apnea.  Unfortunately, he was  unable to sleep on higher levels of BiPAP.   IMPRESSIONS-RECOMMENDATIONS:  1. Severe obstructive sleep apnea with predominant hypopneas.  The      emergence of central apneas with low levels of positive airway      pressure suggests complex sleep apnea.  2. No evidence of cardiac arrhythmias, limb movements, or behavioral      disturbance during sleep.  3. Inadequate titration with a final BiPAP level of 9/5 cm showing      persistent central apneas.   RECOMMENDATIONS:  1. I would suggest either a repeat BiPAP titration in the lab or an      auto BiPAP titration with an iPAP of 5-15 cm and an ePAP of 5-10      cm.  2.  If central apneas persist, he may need different mode of PAP  3. He should be asked to avoid medications with sedative side effects.      He should be asked to avoid driving when sleepy.  4. He was desensitized with a medium-size [employer] mask      prior to the study.      [name], MD  Electronically Signed     RVA/MEDQ  D:  08/06/2008 09:45:08  T:  08/07/2008 00:34:26  Job:  [id number]

## 2010-09-05 NOTE — Assessment & Plan Note (Signed)
[name]                        [name] NOTE   NAME:[name], [name]                      MRN:          [id number]  DATE:08/02/2006                            DOB:          1955/05/27    Brent [name] was seen August 02, 2006 to evaluate ear pain.  He  was originally seen at the [facility] on April 6 and prescribed  Amoxicillin 500 mg two twice a day.  He has also used Mucinex  decongestant but continues to have symptoms.   He denies any constitutional symptoms and has had no purulent  secretions.   EXAMINATION:  He was afebrile.  There was mild erythema of the external  otic canal on the left.  There was tenderness with insertion of the  otoscope.  The tympanic membrane was slightly dull.  He had no  lymphadenopathy.  Extraocular motion was intact and the remainder of  otolaryngologic exam was unremarkable.  He was prescribed Cortisporin  otic suspension 3 drops four times a day.   Additionally refills were given on his omeprazole, ramipril, and Tricor.   His A1c on January 29 was 6.8; goal would be less than 6.5.  He has been  on a low-carb diet, has gained approximately 16 pounds since November  2007.  Weight is now roughly 310.  Blood pressure is well controlled at  118/80.  He has been followed by the Diabetes Education Nurse at [facility]; I will request a formal  endocrinology consult because of suboptimal diabetic control.  He was  asked to visit the website [email or web address] to reviewThe Flat Belly  Diet.  He remains on omeprazole; he has been evaluated by Dr. [name] of [facility] and has is not on routine recall.  There was no  evidence of Barrett's esophagus by his report.  He did have dilation of  the esophagus 3 or 4 years ago.  He has no alarm symptoms at this time.   A copy of this dictation will be sent to Dr. [name] as well,  requesting followup if he  feels such is appropriate.     [name], MD,FACP,FCCP  Electronically Signed    [facility]/MedQ  DD: 08/02/2006  DT: 08/02/2006  Job #: [id number]

## 2010-09-10 ENCOUNTER — Ambulatory Visit (INDEPENDENT_AMBULATORY_CARE_PROVIDER_SITE_OTHER): Payer: PRIVATE HEALTH INSURANCE | Admitting: Family Medicine

## 2010-09-10 ENCOUNTER — Encounter: Payer: Self-pay | Admitting: Family Medicine

## 2010-09-10 DIAGNOSIS — IMO0001 Reserved for inherently not codable concepts without codable children: Secondary | ICD-10-CM

## 2010-09-10 NOTE — Progress Notes (Signed)
Brent [name] [id number] Dec 10, 1955 09/10/2010      Progress Note-Follow Up  Subjective  Chief Complaint  Chief Complaint  Patient presents with  . Follow-up    1 month follow up    HPI  Patient is a 56 year old Caucasian male in today for followup on obesity. He feels he is eating better again with decreased portion sizes and decreased carbs. He does acknowledge however that is not exercising. He said no recent family tragedies her new stressors recently better overall. Reports his blood sugars are well controlled. As above with endocrinology in a couple months to have his hemoglobin A1c checked in. His blood sugars are running below 130. He denies polyuria, polydipsia, chest pain, palp, shortness of breath, GI or GU concerns at today's visit  Past Medical History  Diagnosis Date  . DM (diabetes mellitus), type 2, uncontrolled   . Acid reflux disease   . Sleep apnea     CPAP  . Hx of colonic polyps   . Diverticulosis of colon   . Elevated BP 07/14/2010  . Morbid obesity 03/27/2010  . DIABETES MELLITUS, TYPE II, UNCONTROLLED 07/05/2007  . 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 HYPERSOMNIA, ASSOCIATED WITH SLEEP APNEA 07/26/2008  . DIVERTICULOSIS, COLON 10/29/2008  . Diarrhea 06/13/2010  . COLONIC POLYPS, HX OF 10/29/2008  . Benign neoplasm of colon 07/05/2007  . ACID REFLUX DISEASE 07/05/2007    Past Surgical History  Procedure Date  . Ankle surgery 1994  . Tonsillectomy age 57  . Colonoscopy polyps     Family History  Problem Relation Age of Onset  . Diabetes Mother   . Hypertension Mother   . Stroke Mother   . Hyperlipidemia Mother   . Hypertension Father   . Colon polyps Father   . Heart attack Father 23  . Stroke Father   . Heart attack Brother   . Diabetes Brother   . Heart attack Brother     Multiple  . Diabetes Brother   . Other Brother     heart problems  . Diabetes Sister   . Obesity Brother   . Heart disease Brother   . Hypertension Maternal  Grandmother   . ADD / ADHD Daughter     History   Social History  . Marital Status: Married    Spouse Name: N/A    Number of Children: N/A  . Years of Education: N/A   Occupational History  . Not on file.   Social History Main Topics  . Smoking status: Former Smoker -- 1.5 packs/day for 20 years    Types: Cigarettes    Quit date: 04/21/1991  . Smokeless tobacco: Never Used  . Alcohol Use: 0.0 oz/week    0 drink(s) per week      occasionaly- social  . Drug Use: No  . Sexually Active: Yes   Other Topics Concern  . Not on file   Social History Narrative  . No narrative on file    Current Outpatient Prescriptions on File Prior to Visit  Medication Sig Dispense Refill  . aspirin 325 MG tablet Take 325 mg by mouth 2 (two) times daily.        . cetirizine (ZYRTEC) 10 MG tablet Take 10 mg by mouth daily.        . fenofibrate 160 MG tablet Take 160 mg by mouth daily.        . fish oil-omega-3 fatty acids 1000 MG capsule Take 2,400 mg by mouth 2 (two) times  daily.        . glucose blood (FREESTYLE LITE) test strip 1 each by Other route as directed. Use as instructed       . insulin glargine (LANTUS) 100 UNIT/ML injection Inject 100 Units into the skin daily.        . insulin glulisine (APIDRA) 100 UNIT/ML injection Inject into the skin TID-BC PRN. Sliding scale prn with meals and qhs       . Lancets (FREESTYLE) lancets 1 each by Other route as directed. Use as instructed       . metFORMIN (GLUMETZA) 1000 MG (MOD) 24 hr tablet Take 1,000 mg by mouth 2 (two) times daily.        . mometasone (ELOCON) 0.1 % cream Apply to affected area daily  45 g  1  . omeprazole (PRILOSEC) 20 MG capsule Take 20 mg by mouth daily.        . phenazopyridine (PYRIDIUM) 200 MG tablet Take 200 mg by mouth 3 (three) times daily as needed. For pain       . ramipril (ALTACE) 10 MG tablet Take 10 mg by mouth daily.        . simvastatin (ZOCOR) 5 MG tablet Take 5 mg by mouth daily.        . ciprofloxacin  (CIPRO XR) 500 MG 24 hr tablet Take 500 mg by mouth every 12 (twelve) hours.          Allergies  Allergen Reactions  . Bee Venom   . Morphine     Review of Systems  Review of Systems  Constitutional: Negative for fever and malaise/fatigue.  HENT: Negative for congestion.   Eyes: Negative for discharge.  Respiratory: Negative for shortness of breath.   Cardiovascular: Negative for chest pain, palpitations and leg swelling.  Gastrointestinal: Negative for nausea, abdominal pain and diarrhea.  Genitourinary: Negative for dysuria.  Musculoskeletal: Negative for falls.  Skin: Negative for rash.  Neurological: Negative for loss of consciousness and headaches.  Endo/Heme/Allergies: Negative for polydipsia.  Psychiatric/Behavioral: Negative for depression and suicidal ideas. The patient is not nervous/anxious and does not have insomnia.     Objective  BP 131/81  Pulse 87  Temp(Src) 98.2 F (36.8 C) (Oral)  Ht 5' 8.25" (1.734 m)  Wt 327 lb 12.8 oz (148.689 kg)  BMI 49.48 kg/m2  SpO2 97%  Physical Exam  See below  Lab Results  Component Value Date   TSH 2.02 06/09/2010   Lab Results  Component Value Date   WBC 6.0 06/09/2010   HGB 13.6 06/09/2010   HCT 39.0 06/09/2010   MCV 86.1 06/09/2010   PLT 202.0 06/09/2010   Lab Results  Component Value Date   CREATININE 0.9 06/09/2010   BUN 14 06/09/2010   NA 138 06/09/2010   K 4.4 06/09/2010   CL 101 06/09/2010   CO2 29 06/09/2010   Lab Results  Component Value Date   ALT 34 06/09/2010   AST 34 06/09/2010   ALKPHOS 44 06/09/2010   BILITOT 0.6 06/09/2010   Lab Results  Component Value Date   CHOL 147 06/09/2010   Lab Results  Component Value Date   HDL 35.30* 06/09/2010   No results found for this basename: [facility]   Lab Results  Component Value Date   TRIG 224.0* 06/09/2010   Lab Results  Component Value Date   CHOLHDL 4 06/09/2010     Assessment & Plan  Morbid obesity The patient does not feel she's been eating  better over the last month but unfortunately has continued to gain weight. He was given handouts regarding the DASH diet and encouraged to decrease his portion sizes incrementally and to eat smaller more frequent meals with complex carbs and lean proteins. Encouraged to add even a 5 minute exercise regimen to his day prior to his next visit in a month for weight monitoring  DIABETES MELLITUS, TYPE II, UNCONTROLLED Patient continues to both endocrinology and reports he has an appointment in about 2 months to have his hemoglobin A1c checked. Has no concerning symptoms and when he checks his fasting sugars they are consistently under 1:30. Encouraged to avoid simple carbs and let us know if he needs further blood work.otherwise he will have his endocrinologist for his labs here     Physical Exam  Constitutional: He is oriented to person, place, and time and well-developed, well-nourished, and in no distress. No distress.       Obese  HENT:  Head: Normocephalic and atraumatic.  Eyes: Conjunctivae are normal.  Neck: Neck supple. No JVD present. No thyromegaly present.  Cardiovascular: Normal rate, regular rhythm and normal heart sounds.   No murmur heard. Pulmonary/Chest: Effort normal and breath sounds normal. No stridor. No respiratory distress.  Abdominal: He exhibits no distension and no mass. There is no tenderness.  Musculoskeletal: He exhibits no edema.  Neurological: He is alert and oriented to person, place, and time.  Skin: Skin is warm.  Psychiatric: Memory, affect and judgment normal.

## 2010-09-10 NOTE — Patient Instructions (Signed)
Obesity Obesity is defined as having a Body Mass Index (BMI) of 30 or more. To calculate your BMI divide your weight in pounds by your height in inches squared and multiply that product by 703. Major illnesses resulting from long-term obesity include:  Stroke.   Heart disease.   Diabetes.   Many cancers.   Arthritis.  Obesity also complicates recovery from many other medical problems.  CAUSES  A history of obesity in your parents.   Thyroid hormone imbalance.   Environmental factors such as excess calorie intake and physical inactivity.  TREATMENT A healthy weight loss program includes:  A calorie restricted diet based on individual calorie needs.   Increased physical activity (exercise).  An exercise program is just as important as the right low calorie diet.  Weight-loss medicines should be used only under the supervision of your physician. These medicines help, but only if they are used with diet and exercise programs. Medicines can have side effects including nervousness, nausea, abdominal pain, diarrhea, headache, drowsiness, and depression.  An unhealthy weight loss program includes:  Fasting.   Fad diets.   Supplements and drugs.  These choices do not succeed in long-term weight control.  HOME CARE INSTRUCTIONS To help you make the needed dietary changes:   Keep a daily record of everything you eat. There are many free websites to help you with this. It may be helpful to measure your foods so you can determine if you are eating the correct portion sizes.   Use low-calorie cookbooks or take special cooking classes.   Avoid alcohol. Drink more water and drinks with no calories.   Take vitamins and supplements only as recommended by your caregiver.   Weight-loss support groups, Registered Dieticians, counselors, and stress reduction education can also be very helpful.  PREVENTION Losing weight and keeping it off takes time, discipline, a healthy diet and regular  exercise. Document Released: 05/14/2004 Document Re-Released: 04/26/2007 [employer] Patient Information 2011 [employer]. 

## 2010-09-10 NOTE — Assessment & Plan Note (Signed)
The patient does not feel she's been eating better over the last month but unfortunately has continued to gain weight. He was given handouts regarding the DASH diet and encouraged to decrease his portion sizes incrementally and to eat smaller more frequent meals with complex carbs and lean proteins. Encouraged to add even a 5 minute exercise regimen to his day prior to his next visit in a month for weight monitoring

## 2010-09-10 NOTE — Assessment & Plan Note (Signed)
Patient continues to both endocrinology and reports he has an appointment in about 2 months to have his hemoglobin A1c checked. Has no concerning symptoms and when he checks his fasting sugars they are consistently under 1:30. Encouraged to avoid simple carbs and let us know if he needs further blood work.otherwise he will have his endocrinologist for his labs here

## 2010-10-09 ENCOUNTER — Ambulatory Visit (HOSPITAL_COMMUNITY)
Admission: RE | Admit: 2010-10-09 | Discharge: 2010-10-09 | Disposition: A | Discharge status: Home / Self Care | Source: Ambulatory Visit | Attending: Specialist | Admitting: Specialist

## 2010-10-09 ENCOUNTER — Other Ambulatory Visit (HOSPITAL_COMMUNITY): Payer: Self-pay | Admitting: Specialist

## 2010-10-09 DIAGNOSIS — Z1389 Encounter for screening for other disorder: Secondary | ICD-10-CM | POA: Insufficient documentation

## 2010-10-10 ENCOUNTER — Ambulatory Visit (INDEPENDENT_AMBULATORY_CARE_PROVIDER_SITE_OTHER): Admitting: Family Medicine

## 2010-10-10 ENCOUNTER — Encounter: Payer: Self-pay | Admitting: Family Medicine

## 2010-10-10 VITALS — BP 120/76 | HR 94 | Temp 97.9°F | Ht 68.25 in | Wt 321.4 lb

## 2010-10-10 DIAGNOSIS — M25569 Pain in unspecified knee: Secondary | ICD-10-CM

## 2010-10-10 DIAGNOSIS — M25562 Pain in left knee: Secondary | ICD-10-CM

## 2010-10-10 DIAGNOSIS — IMO0001 Reserved for inherently not codable concepts without codable children: Secondary | ICD-10-CM

## 2010-10-10 DIAGNOSIS — G8929 Other chronic pain: Secondary | ICD-10-CM | POA: Insufficient documentation

## 2010-10-10 HISTORY — DX: Pain in left knee: M25.562

## 2010-10-10 MED ORDER — HYDROCODONE-ACETAMINOPHEN 5-325 MG PO TABS: 1.0000 | ORAL_TABLET | Freq: Three times a day (TID) | ORAL | Status: AC | PRN

## 2010-10-10 MED ORDER — NAPROXEN 375 MG PO TABS: 375.0000 mg | ORAL_TABLET | Freq: Two times a day (BID) | ORAL | Status: DC | PRN

## 2010-10-10 NOTE — Assessment & Plan Note (Signed)
Will repeat hgba1c at next visit, avoid simple carbs and continue current meds

## 2010-10-10 NOTE — Assessment & Plan Note (Signed)
He should continue to watch his by mouth intake and is using more lean proteins and complex carbs. Unfortunately he hurt his knee in early June and has been unable to do a lot of exercise. He is in the process of having that worked up in the meantime we'll continue to try and watch his by mouth intake

## 2010-10-10 NOTE — Assessment & Plan Note (Signed)
The patient was at a barbecue cook on June 8 was on his feet all day long but denies any acute injury but has had pain in any of presents. He was seen by orthopedics given a cortisone injection but unfortunately that wore off and just a few days and he was back in orthopedics office. At present he has an MRI scheduled for next week and they're concerned that he has damaged one of his ligaments for his meniscus. At the present time he is given an anti-inflammatory and also Norco the use at bedtime to help manage his symptoms and he will maintain light weightbearing. Follow up with orthopedics

## 2010-10-10 NOTE — Patient Instructions (Signed)
Obesity Obesity is defined as having a Body Mass Index (BMI) of 30 or more. To calculate your BMI divide your weight in pounds by your height in inches squared and multiply that product by 703. Major illnesses resulting from long-term obesity include:  Stroke.   Heart disease.   Diabetes.   Many cancers.   Arthritis.  Obesity also complicates recovery from many other medical problems.  CAUSES  A history of obesity in your parents.   Thyroid hormone imbalance.   Environmental factors such as excess calorie intake and physical inactivity.  TREATMENT A healthy weight loss program includes:  A calorie restricted diet based on individual calorie needs.   Increased physical activity (exercise).  An exercise program is just as important as the right low calorie diet.  Weight-loss medicines should be used only under the supervision of your physician. These medicines help, but only if they are used with diet and exercise programs. Medicines can have side effects including nervousness, nausea, abdominal pain, diarrhea, headache, drowsiness, and depression.  An unhealthy weight loss program includes:  Fasting.   Fad diets.   Supplements and drugs.  These choices do not succeed in long-term weight control.  HOME CARE INSTRUCTIONS To help you make the needed dietary changes:   Keep a daily record of everything you eat. There are many free websites to help you with this. It may be helpful to measure your foods so you can determine if you are eating the correct portion sizes.   Use low-calorie cookbooks or take special cooking classes.   Avoid alcohol. Drink more water and drinks with no calories.   Take vitamins and supplements only as recommended by your caregiver.   Weight-loss support groups, Registered Dieticians, counselors, and stress reduction education can also be very helpful.  PREVENTION Losing weight and keeping it off takes time, discipline, a healthy diet and regular  exercise. Document Released: 05/14/2004 Document Re-Released: 04/26/2007 [employer] Patient Information 2011 [employer]. 

## 2010-10-10 NOTE — Progress Notes (Signed)
Brent [name] [id number] [birth year] 10/10/2010      Progress Note-Follow Up  Subjective  Chief Complaint  Chief Complaint  Patient presents with  . Follow-up    1 month follow up    HPI  Is a 55 year old Caucasian male who is in today for followup on multiple medical problems the most notably his obesity. He use to try and watch his by mouth intake using complex carbs and the proteins. Unfortunately he was at a [employer] out earlier in the month of June and hurt his left knee and has been unable to exercise since that time. Is presently following with orthopedics. Did have a cortisone injection but that was minimally helpful may now have him scheduled for an MRI of his knee next week. The concern is that he is getting some ligaments or menisci. No other complaints. No recent illness, fevers, chills, chest pain, palpitations, shortness of breath, GI or GU complaints noted at today's visit. Does note some swelling over the left knee as well  Past Medical History  Diagnosis Date  . DM (diabetes mellitus), type 2, uncontrolled   . Acid reflux disease   . Sleep apnea     CPAP  . Hx of colonic polyps   . Diverticulosis of colon   . Elevated BP 07/14/2010  . Morbid obesity 03/27/2010  . DIABETES MELLITUS, TYPE II, UNCONTROLLED 07/05/2007  . 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 HYPERSOMNIA, ASSOCIATED WITH SLEEP APNEA 07/26/2008  . DIVERTICULOSIS, COLON 10/29/2008  . Diarrhea 06/13/2010  . COLONIC POLYPS, HX OF 10/29/2008  . Benign neoplasm of colon 07/05/2007  . ACID REFLUX DISEASE 07/05/2007  . Knee pain, left 10/10/2010    Past Surgical History  Procedure Date  . Ankle surgery 1994  . Tonsillectomy age 70  . Colonoscopy polyps     Family History  Problem Relation Age of Onset  . Diabetes Mother   . Hypertension Mother   . Stroke Mother   . Hyperlipidemia Mother   . Hypertension Father   . Colon polyps Father   . Heart attack Father 70  . Stroke Father   . Heart attack  Brother   . Diabetes Brother   . Heart attack Brother     Multiple  . Diabetes Brother   . Other Brother     heart problems  . Diabetes Sister   . Obesity Brother   . Heart disease Brother   . Hypertension Maternal Grandmother   . ADD / ADHD Daughter     History   Social History  . Marital Status: Married    Spouse Name: N/A    Number of Children: N/A  . Years of Education: N/A   Occupational History  . Not on file.   Social History Main Topics  . Smoking status: Former Smoker -- 1.5 packs/day for 20 years    Types: Cigarettes    Quit date: 04/21/1991  . Smokeless tobacco: Never Used  . Alcohol Use: 0.0 oz/week    0 drink(s) per week      occasionaly- social  . Drug Use: No  . Sexually Active: Yes   Other Topics Concern  . Not on file   Social History Narrative  . No narrative on file    Current Outpatient Prescriptions on File Prior to Visit  Medication Sig Dispense Refill  . aspirin 325 MG tablet Take 325 mg by mouth 2 (two) times daily.        . cetirizine (ZYRTEC) 10  MG tablet Take 10 mg by mouth daily.        . ciprofloxacin (CIPRO XR) 500 MG 24 hr tablet Take 500 mg by mouth every 12 (twelve) hours.        . fenofibrate 160 MG tablet Take 160 mg by mouth daily.        . fish oil-omega-3 fatty acids 1000 MG capsule Take 2,400 mg by mouth 2 (two) times daily.        [name] glucose blood (FREESTYLE LITE) test strip 1 each by Other route as directed. Use as instructed       . insulin glargine (LANTUS) 100 UNIT/ML injection Inject 100 Units into the skin daily.        . insulin glulisine (APIDRA) 100 UNIT/ML injection Inject into the skin TID-BC PRN. Sliding scale prn with meals and qhs       . Lancets (FREESTYLE) lancets 1 each by Other route as directed. Use as instructed       . metFORMIN (GLUMETZA) 1000 MG (MOD) 24 hr tablet Take 1,000 mg by mouth 2 (two) times daily.        . mometasone (ELOCON) 0.1 % cream Apply to affected area daily  45 g  1  . omeprazole  (PRILOSEC) 20 MG capsule Take 20 mg by mouth daily.        . phenazopyridine (PYRIDIUM) 200 MG tablet Take 200 mg by mouth 3 (three) times daily as needed. For pain       . ramipril (ALTACE) 10 MG tablet Take 10 mg by mouth daily.        . simvastatin (ZOCOR) 5 MG tablet Take 5 mg by mouth daily.          Allergies  Allergen Reactions  . Bee Venom   . Morphine     Review of Systems  Review of Systems  Constitutional: Negative for fever and malaise/fatigue.  HENT: Negative for congestion.   Eyes: Negative for discharge.  Respiratory: Negative for shortness of breath.   Cardiovascular: Negative for chest pain, palpitations and leg swelling.  Gastrointestinal: Negative for nausea, abdominal pain and diarrhea.  Genitourinary: Negative for dysuria.  Musculoskeletal: Positive for joint pain. Negative for falls.       Left knee pain  Skin: Negative for rash.  Neurological: Negative for loss of consciousness and headaches.  Endo/Heme/Allergies: Negative for polydipsia.  Psychiatric/Behavioral: Negative for depression and suicidal ideas. The patient is not nervous/anxious and does not have insomnia.     Objective  BP 120/76  Pulse 94  Temp(Src) 97.9 F (36.6 C) (Oral)  Ht 5' 8.25" (1.734 m)  Wt 321 lb 6.4 oz (145.786 kg)  BMI 48.51 kg/m2  SpO2 99%  Physical Exam  Physical Exam  Constitutional: He is oriented to person, place, and time and well-developed, well-nourished, and in no distress. No distress.       obese  HENT:  Head: Normocephalic and atraumatic.  Eyes: Conjunctivae are normal.  Neck: Neck supple. No thyromegaly present.  Cardiovascular: Normal rate, regular rhythm and normal heart sounds.   No murmur heard. Pulmonary/Chest: Effort normal and breath sounds normal. No respiratory distress.  Abdominal: He exhibits no distension and no mass. There is no tenderness.  Musculoskeletal: He exhibits edema and tenderness.       Left knee anteriorly  Neurological: He  is alert and oriented to person, place, and time.  Skin: Skin is warm.  Psychiatric: Memory, affect and judgment normal.    Lab  Results  Component Value Date   TSH 2.02 06/09/2010   Lab Results  Component Value Date   WBC 6.0 06/09/2010   HGB 13.6 06/09/2010   HCT 39.0 06/09/2010   MCV 86.1 06/09/2010   PLT 202.0 06/09/2010   Lab Results  Component Value Date   CREATININE 0.9 06/09/2010   BUN 14 06/09/2010   NA 138 06/09/2010   K 4.4 06/09/2010   CL 101 06/09/2010   CO2 29 06/09/2010   Lab Results  Component Value Date   ALT 34 06/09/2010   AST 34 06/09/2010   ALKPHOS 44 06/09/2010   BILITOT 0.6 06/09/2010   Lab Results  Component Value Date   CHOL 147 06/09/2010   Lab Results  Component Value Date   HDL 35.30* 06/09/2010   No results found for this basename: [facility]   Lab Results  Component Value Date   TRIG 224.0* 06/09/2010   Lab Results  Component Value Date   CHOLHDL 4 06/09/2010     Assessment & Plan  Morbid obesity He should continue to watch his by mouth intake and is using more lean proteins and complex carbs. Unfortunately he hurt his knee in early June and has been unable to do a lot of exercise. He is in the process of having that worked up in the meantime we'll continue to try and watch his by mouth intake  Knee pain, left The patient was at a barbecue cook on June 8 was on his feet all day long but denies any acute injury but has had pain in any of presents. He was seen by orthopedics given a cortisone injection but unfortunately that wore off and just a few days and he was back in orthopedics office. At present he has an MRI scheduled for next week and they're concerned that he has damaged one of his ligaments for his meniscus. At the present time he is given an anti-inflammatory and also Norco the use at bedtime to help manage his symptoms and he will maintain light weightbearing. Follow up with orthopedics  DIABETES MELLITUS, TYPE II, UNCONTROLLED Will  repeat hgba1c at next visit, avoid simple carbs and continue current meds

## 2010-11-06 HISTORY — PX: KNEE ARTHROSCOPY: SHX127

## 2010-11-10 ENCOUNTER — Encounter: Payer: Self-pay | Admitting: Family Medicine

## 2010-11-10 ENCOUNTER — Ambulatory Visit (INDEPENDENT_AMBULATORY_CARE_PROVIDER_SITE_OTHER): Admitting: Family Medicine

## 2010-11-10 VITALS — BP 148/86 | HR 92 | Ht 69.0 in | Wt 324.0 lb

## 2010-11-10 DIAGNOSIS — K219 Gastro-esophageal reflux disease without esophagitis: Secondary | ICD-10-CM

## 2010-11-10 DIAGNOSIS — R030 Elevated blood-pressure reading, without diagnosis of hypertension: Secondary | ICD-10-CM

## 2010-11-10 DIAGNOSIS — IMO0001 Reserved for inherently not codable concepts without codable children: Secondary | ICD-10-CM

## 2010-11-10 DIAGNOSIS — I10 Essential (primary) hypertension: Secondary | ICD-10-CM

## 2010-11-10 DIAGNOSIS — M25569 Pain in unspecified knee: Secondary | ICD-10-CM

## 2010-11-10 DIAGNOSIS — M25562 Pain in left knee: Secondary | ICD-10-CM

## 2010-11-10 MED ORDER — RAMIPRIL 10 MG PO TABS: ORAL_TABLET | ORAL | Status: DC

## 2010-11-10 NOTE — Progress Notes (Signed)
Brent [name] [id number] [birth year] 11/10/2010      Progress Note-Follow Up  Subjective  Chief Complaint  Chief Complaint  Patient presents with  . Obesity    HPI  Patient is a 55 yo male in today with his wife to discuss his ongoing weight loss efforts. He underwent left knee arthroscopy last week and was in quite of bit of pain both before and after the surgery which has limited his ability to stay mobile. 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 As a result unfortunately his weight is up slightly this visit. He reports he is still trying to minimize simple carbs and bad fat it has not been as diligent as writing and at times. He is anxious to proceed with gastric bypass. He has an appointment with his surgeon tomorrow. We are at the end of our 7 months cycle of taking monthly weights. After his visit tomorrow he will let us know exactly who to forward his weight log and notes to. He and his wife note that they have been monitoring his blood pressure and they've been trending higher lately systolics often in the 130s to 140s and diastolics 80s to 90s. He denies headache, chest pain, palpitations, shortness of breath, GI or GU complaints as a result. He notes his bowels are moving well and he offers no concerns regarding. He has an appointment in the very near future to me with his endocrinologist as well and reports they will do lab work to further evaluate his cholesterol and sugar.   Past Medical History  Diagnosis Date  . DM (diabetes mellitus), type 2, uncontrolled   . Acid reflux disease   . Sleep apnea     CPAP  . Hx of colonic polyps   . Diverticulosis of colon   . Elevated BP 07/14/2010  . Morbid obesity 03/27/2010  . DIABETES MELLITUS, TYPE II, UNCONTROLLED 07/05/2007  . 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 HYPERSOMNIA, ASSOCIATED WITH SLEEP APNEA 07/26/2008  . DIVERTICULOSIS, COLON 10/29/2008  . Diarrhea 06/13/2010  . COLONIC POLYPS, HX OF  10/29/2008  . Benign neoplasm of colon 07/05/2007  . ACID REFLUX DISEASE 07/05/2007  . Knee pain, left 10/10/2010  . Tear of meniscus of left knee 2012    Past Surgical History  Procedure Date  . Ankle surgery 1994  . Tonsillectomy age 45  . Colonoscopy polyps   . Knee arthroscopy 11/06/10    Left, torn meniscus (repaired)    Family History  Problem Relation Age of Onset  . Diabetes Mother   . Hypertension Mother   . Stroke Mother   . Hyperlipidemia Mother   . Hypertension Father   . Colon polyps Father   . Heart attack Father 30  . Stroke Father   . Heart attack Brother   . Diabetes Brother   . Heart attack Brother     Multiple  . Diabetes Brother   . Other Brother     heart problems  . Diabetes Sister   . Obesity Brother   . Heart disease Brother   . Hypertension Maternal Grandmother   . ADD / ADHD Daughter     History   Social History  . Marital Status: Married    Spouse Name: N/A    Number of Children: N/A  . Years of Education: N/A   Occupational History  . Not on file.   Social History Main Topics  . Smoking status: Former Smoker --  1.5 packs/day for 20 years    Types: Cigarettes    Quit date: 04/21/1991  . Smokeless tobacco: Never Used  . Alcohol Use: 0.0 oz/week    0 drink(s) per week      occasionaly- social  . Drug Use: No  . Sexually Active: Yes   Other Topics Concern  . Not on file   Social History Narrative  . No narrative on file    Current Outpatient Prescriptions on File Prior to Visit  Medication Sig Dispense Refill  . aspirin 325 MG tablet Take 325 mg by mouth 2 (two) times daily.        . cetirizine (ZYRTEC) 10 MG tablet Take 10 mg by mouth daily.        . fenofibrate 160 MG tablet Take 160 mg by mouth daily.        . fish oil-omega-3 fatty acids 1000 MG capsule Take 2,400 mg by mouth 2 (two) times daily.        [name] glucose blood (FREESTYLE LITE) test strip 1 each by Other route as directed. Use as instructed       . insulin  glargine (LANTUS) 100 UNIT/ML injection Inject 100 Units into the skin daily.        . insulin glulisine (APIDRA) 100 UNIT/ML injection Inject into the skin TID-BC PRN. Sliding scale prn with meals and qhs       . Lancets (FREESTYLE) lancets 1 each by Other route as directed. Use as instructed       . metFORMIN (GLUMETZA) 1000 MG (MOD) 24 hr tablet Take 1,000 mg by mouth 2 (two) times daily.        . mometasone (ELOCON) 0.1 % cream Apply to affected area daily  45 g  1  . omeprazole (PRILOSEC) 20 MG capsule Take 20 mg by mouth daily.        . simvastatin (ZOCOR) 5 MG tablet Take 5 mg by mouth daily.        . naproxen (NAPROSYN) 375 MG tablet Take 1 tablet (375 mg total) by mouth 2 (two) times daily as needed.  60 tablet  2    Allergies  Allergen Reactions  . Bee Venom   . Morphine     Review of Systems  Review of Systems  Constitutional: Negative for fever and malaise/fatigue.  HENT: Negative for congestion.   Eyes: Negative for discharge.  Respiratory: Negative for shortness of breath.   Cardiovascular: Negative for chest pain, palpitations and leg swelling.  Gastrointestinal: Negative for nausea, abdominal pain and diarrhea.  Genitourinary: Negative for dysuria.  Musculoskeletal: Negative for falls.       Left knee pain, improving s/p arthroscopy.   Skin: Negative for rash.  Neurological: Negative for loss of consciousness and headaches.  Endo/Heme/Allergies: Negative for polydipsia.  Psychiatric/Behavioral: Negative for depression and suicidal ideas. The patient is not nervous/anxious and does not have insomnia.      Objective  BP 148/86  Pulse 92  Ht 5\' 9"  (1.753 m)  Wt 324 lb (146.965 kg)  BMI 47.85 kg/m2  SpO2 96%  Physical Exam  Physical Exam  Constitutional: He is oriented to person, place, and time and well-developed, well-nourished, and in no distress. No distress.       obese  HENT:  Head: Normocephalic and atraumatic.  Eyes: Conjunctivae are normal.    Neck: Neck supple. No thyromegaly present.  Cardiovascular: Normal rate, regular rhythm and normal heart sounds.   No murmur heard. Pulmonary/Chest: Effort  normal and breath sounds normal. No respiratory distress.  Abdominal: He exhibits no distension and no mass. There is no tenderness.  Musculoskeletal: He exhibits no edema.       3 bandaids over left knee, no surrounding erythema, warmth or swelling  Neurological: He is alert and oriented to person, place, and time.  Skin: Skin is warm.  Psychiatric: Memory, affect and judgment normal.    Lab Results  Component Value Date   TSH 2.02 06/09/2010   Lab Results  Component Value Date   WBC 6.0 06/09/2010   HGB 13.6 06/09/2010   HCT 39.0 06/09/2010   MCV 86.1 06/09/2010   PLT 202.0 06/09/2010   Lab Results  Component Value Date   CREATININE 0.9 06/09/2010   BUN 14 06/09/2010   NA 138 06/09/2010   K 4.4 06/09/2010   CL 101 06/09/2010   CO2 29 06/09/2010   Lab Results  Component Value Date   ALT 34 06/09/2010   AST 34 06/09/2010   ALKPHOS 44 06/09/2010   BILITOT 0.6 06/09/2010   Lab Results  Component Value Date   CHOL 147 06/09/2010   Lab Results  Component Value Date   HDL 35.30* 06/09/2010   No results found for this basename: [facility]   Lab Results  Component Value Date   TRIG 224.0* 06/09/2010   Lab Results  Component Value Date   CHOLHDL 4 06/09/2010     Assessment & Plan  Morbid obesity Patient in today for reevaluation of his ongoing difficulties with obesity. Unfortunately since his last visit his weight is up slightly likely due to recent knee surgery and increased immobility. He continues to try to watch his diet although does that on some days than others. He is anxious to proceed with the possibility of gastric bypass and understands the long-term commitment of dietary lifestyle changes as well. He will see his surgeon tomorrow we will forward any information at the request after that visit is completed. Patient  encouraged to maintain as much activity as possible and to decrease by mouth intake using lean proteins and complex carbs  Knee pain, left Has just completed arthroscopy and pain is improving daily is using Norco 5/325 prn for pain without any concerning SE. Did not take any this am.  Elevated BP Pressure has been creeping up per the patient and his wife, who is a nurse, they are seeing systolics in the 130s and 140s and diastolics in the 80s and 90s will increase Altace to 20 mg daily and reevaluate at next visit.  ACID REFLUX DISEASE No c/o today, avoid offending foods and no change in meds  DIABETES MELLITUS, TYPE II, UNCONTROLLED Is following with Dr Lawerance Bach of Endocrinology and will have labs done with her in the next month, he agrees to have these numbers forwarded to [place] so we may file in his chart

## 2010-11-10 NOTE — Assessment & Plan Note (Signed)
Has just completed arthroscopy and pain is improving daily is using Norco 5/325 prn for pain without any concerning SE. Did not take any this am.

## 2010-11-10 NOTE — Assessment & Plan Note (Addendum)
Patient in today for reevaluation of his ongoing difficulties with obesity. Unfortunately since his last visit his weight is up slightly likely due to recent knee surgery and increased immobility. He continues to try to watch his diet although does that on some days than others. He is anxious to proceed with the possibility of gastric bypass and understands the long-term commitment of dietary lifestyle changes as well. He will see his surgeon tomorrow we will forward any information at the request after that visit is completed. Patient encouraged to maintain as much activity as possible and to decrease by mouth intake using lean proteins and complex carbs

## 2010-11-10 NOTE — Patient Instructions (Signed)
Obesity Obesity is defined as having a Body Mass Index (BMI) of 30 or more. To calculate your BMI divide your weight in pounds by your height in inches squared and multiply that product by 703. Major illnesses resulting from long-term obesity include:  Stroke.   Heart disease.   Diabetes.   Many cancers.   Arthritis.  Obesity also complicates recovery from many other medical problems.  CAUSES  A history of obesity in your parents.   Thyroid hormone imbalance.   Environmental factors such as excess calorie intake and physical inactivity.  TREATMENT A healthy weight loss program includes:  A calorie restricted diet based on individual calorie needs.   Increased physical activity (exercise).  An exercise program is just as important as the right low calorie diet.  Weight-loss medicines should be used only under the supervision of your physician. These medicines help, but only if they are used with diet and exercise programs. Medicines can have side effects including nervousness, nausea, abdominal pain, diarrhea, headache, drowsiness, and depression.  An unhealthy weight loss program includes:  Fasting.   Fad diets.   Supplements and drugs.  These choices do not succeed in long-term weight control.  HOME CARE INSTRUCTIONS To help you make the needed dietary changes:   Keep a daily record of everything you eat. There are many free websites to help you with this. It may be helpful to measure your foods so you can determine if you are eating the correct portion sizes.   Use low-calorie cookbooks or take special cooking classes.   Avoid alcohol. Drink more water and drinks with no calories.   Take vitamins and supplements only as recommended by your caregiver.   Weight-loss support groups, Registered Dieticians, counselors, and stress reduction education can also be very helpful.  PREVENTION Losing weight and keeping it off takes time, discipline, a healthy diet and regular  exercise. Document Released: 05/14/2004 Document Re-Released: 04/26/2007 [employer] Patient Information 2011 [employer]. 

## 2010-11-10 NOTE — Assessment & Plan Note (Signed)
No c/o today, avoid offending foods and no change in meds

## 2010-11-10 NOTE — Assessment & Plan Note (Signed)
Pressure has been creeping up per the patient and his wife, who is a Engineer, civil (consulting), they are seeing systolics in the 130s and 140s and diastolics in the 80s and 90s will increase Altace to 20 mg daily and reevaluate at next visit.

## 2010-11-10 NOTE — Assessment & Plan Note (Signed)
Is following with Dr Lawerance Bach of Endocrinology and will have labs done with her in the next month, he agrees to have these numbers forwarded to [place] so we may file in his chart

## 2010-11-19 ENCOUNTER — Telehealth: Payer: Self-pay

## 2010-11-19 NOTE — Telephone Encounter (Signed)
faxed

## 2010-11-19 NOTE — Telephone Encounter (Signed)
Needs last 6 office visit notes (for weight) faxed [phone] attn. Winfield Rast- [facility]

## 2010-11-20 ENCOUNTER — Ambulatory Visit (INDEPENDENT_AMBULATORY_CARE_PROVIDER_SITE_OTHER): Admitting: Pulmonary Disease

## 2010-11-20 ENCOUNTER — Encounter: Payer: Self-pay | Admitting: Pulmonary Disease

## 2010-11-20 DIAGNOSIS — G4710 Hypersomnia, unspecified: Secondary | ICD-10-CM

## 2010-11-20 NOTE — Progress Notes (Signed)
  Subjective:    Patient ID: Brent [name], male    DOB: [birth year], 55 y.o.   MRN: [id number]  HPI 55 y.o obese, diabetic with severe obstructive sleep apnea .  4/10>> severe obstructive sleep apnea with predominant hypopneas causing sleep fragmentation & hypoxia, AHI 33/h, lowest desatn 84%.  Central apneas emerged on low levels of CPAP ? complex sleep apnea, did not tolerate cycling of BiPAP,final level tried was 9/5 with 5 central apneas & 1 mixed apnea in 5 mins of sleep  reviewed data on IPAP 5-15 & EPAP 5-10>> good compliance, no leak, 95th percentile pressure 15  Pressure changed to IPAP 10-18, EPAP 5-10    11/20/2010 2 yr FU, Gained 14 lbs He feels great, sleeping well, mask ok, presure Ok, no leak.  Underwent lt knee arthroscopic surgery - uneventful No snoring, wakes up rested  Review of Systems Patient denies significant dyspnea,cough, hemoptysis,  chest pain, palpitations, pedal edema, orthopnea, paroxysmal nocturnal dyspnea, lightheadedness, nausea, vomiting, abdominal or  leg pains      Objective:   Physical Exam Gen. Pleasant, obese, in no distress ENT - no lesions, no post nasal drip, class 3 airway Neck: No JVD, no thyromegaly, no carotid bruits Lungs: no use of accessory muscles, no dullness to percussion, clear without rales or rhonchi  Cardiovascular: Rhythm regular, heart sounds  normal, no murmurs or gallops, no peripheral edema Musculoskeletal: No deformities, no cyanosis or clubbing         Assessment & Plan:

## 2010-11-20 NOTE — Patient Instructions (Signed)
Send in the card in 1 month so I can look at download information

## 2010-11-20 NOTE — Assessment & Plan Note (Signed)
Ct IPAP 10-18/ EPAP 5-10 Chk download to ensure no change in pr required. Weight loss encouraged, compliance with goal of at least 4-6 hrs every night is the expectation. Advised against medications with sedative side effects Cautioned against driving when sleepy - understanding that sleepiness will vary on a day to day basis

## 2010-12-26 ENCOUNTER — Encounter: Payer: Self-pay | Admitting: Internal Medicine

## 2011-02-11 ENCOUNTER — Other Ambulatory Visit (INDEPENDENT_AMBULATORY_CARE_PROVIDER_SITE_OTHER)

## 2011-02-11 ENCOUNTER — Ambulatory Visit (INDEPENDENT_AMBULATORY_CARE_PROVIDER_SITE_OTHER): Admitting: Family Medicine

## 2011-02-11 VITALS — BP 146/88 | HR 99 | Temp 98.1°F | Ht 68.25 in | Wt 317.8 lb

## 2011-02-11 DIAGNOSIS — I10 Essential (primary) hypertension: Secondary | ICD-10-CM

## 2011-02-11 DIAGNOSIS — M25569 Pain in unspecified knee: Secondary | ICD-10-CM

## 2011-02-11 DIAGNOSIS — M25562 Pain in left knee: Secondary | ICD-10-CM

## 2011-02-11 DIAGNOSIS — Z23 Encounter for immunization: Secondary | ICD-10-CM

## 2011-02-11 DIAGNOSIS — R030 Elevated blood-pressure reading, without diagnosis of hypertension: Secondary | ICD-10-CM

## 2011-02-11 DIAGNOSIS — R7309 Other abnormal glucose: Secondary | ICD-10-CM

## 2011-02-11 DIAGNOSIS — E669 Obesity, unspecified: Secondary | ICD-10-CM

## 2011-02-11 DIAGNOSIS — IMO0001 Reserved for inherently not codable concepts without codable children: Secondary | ICD-10-CM

## 2011-02-11 DIAGNOSIS — E785 Hyperlipidemia, unspecified: Secondary | ICD-10-CM

## 2011-02-11 LAB — RENAL FUNCTION PANEL
Albumin: 4.5 g/dL (ref 3.5–5.2)
BUN: 15 mg/dL (ref 6–23)
CO2: 25 mEq/L (ref 19–32)
Calcium: 9.4 mg/dL (ref 8.4–10.5)
Chloride: 105 mEq/L (ref 96–112)
Creatinine, Ser: 0.9 mg/dL (ref 0.4–1.5)
GFR: 92.99 mL/min (ref >60.00)
Glucose, Bld: 169 mg/dL — ABNORMAL HIGH (ref 70–99)
Phosphorus: 2.8 mg/dL (ref 2.3–4.6)
Potassium: 4.2 mEq/L (ref 3.5–5.1)
Sodium: 141 mEq/L (ref 135–145)

## 2011-02-11 LAB — LIPID PANEL
Cholesterol: 172 mg/dL (ref 0–200)
HDL: 42.7 mg/dL (ref >39.00)
Total CHOL/HDL Ratio: 4
Triglycerides: 208 mg/dL — ABNORMAL HIGH (ref 0.0–149.0)
VLDL: 41.6 mg/dL — ABNORMAL HIGH (ref 0.0–40.0)

## 2011-02-11 LAB — CBC
HCT: 42.9 % (ref 39.0–52.0)
Hemoglobin: 14.3 g/dL (ref 13.0–17.0)
MCH: 28.5 pg (ref 26.0–34.0)
MCHC: 33.3 g/dL (ref 30.0–36.0)
MCV: 85.5 fL (ref 78.0–100.0)
Platelets: 270 10*3/uL (ref 150–400)
RBC: 5.02 MIL/uL (ref 4.22–5.81)
RDW: 13.2 % (ref 11.5–15.5)
WBC: 6.2 10*3/uL (ref 4.0–10.5)

## 2011-02-11 LAB — HEPATIC FUNCTION PANEL
ALT: 34 U/L (ref 0–53)
AST: 33 U/L (ref 0–37)
Albumin: 4.5 g/dL (ref 3.5–5.2)
Alkaline Phosphatase: 42 U/L (ref 39–117)
Bilirubin, Direct: 0 mg/dL (ref 0.0–0.3)
Total Bilirubin: 0.7 mg/dL (ref 0.3–1.2)
Total Protein: 7.4 g/dL (ref 6.0–8.3)

## 2011-02-11 LAB — HEMOGLOBIN A1C
Hgb A1c MFr Bld: 6.8 % — ABNORMAL HIGH (ref <5.7)
Mean Plasma Glucose: 148 mg/dL — ABNORMAL HIGH (ref <117)

## 2011-02-11 LAB — LDL CHOLESTEROL, DIRECT: Direct LDL: 110.3 mg/dL

## 2011-02-11 LAB — TSH: TSH: 1.66 u[IU]/mL (ref 0.35–5.50)

## 2011-02-11 MED ORDER — RAMIPRIL 10 MG PO TABS: 10.0000 mg | ORAL_TABLET | Freq: Every day | ORAL | Status: DC

## 2011-02-11 MED ORDER — NAPROXEN 375 MG PO TABS: 375.0000 mg | ORAL_TABLET | Freq: Two times a day (BID) | ORAL | Status: DC | PRN

## 2011-02-11 MED ORDER — METOPROLOL SUCCINATE ER 25 MG PO TB24: 25.0000 mg | ORAL_TABLET | Freq: Every day | ORAL | Status: DC

## 2011-02-11 NOTE — Patient Instructions (Signed)

## 2011-02-12 ENCOUNTER — Telehealth: Payer: Self-pay | Admitting: Family Medicine

## 2011-02-12 NOTE — Telephone Encounter (Signed)
Gave patient A1C result

## 2011-02-12 NOTE — Telephone Encounter (Signed)
Faxed 02/11/11 A1C results to [facility] [phone]

## 2011-02-15 ENCOUNTER — Encounter: Payer: Self-pay | Admitting: Family Medicine

## 2011-02-15 NOTE — Progress Notes (Signed)
Brent [name] [id number] 08/13/1955 02/15/2011      Progress Note-Follow Up  Subjective  Chief Complaint  Chief Complaint  Patient presents with  . Follow-up    3 month follow up    HPI  Patient is a 55 year old Caucasian male in today for followup. He reports his blood sugars are well controlled generally seeing numbers in the 100 to 1:30 range. He acknowledges occasional numbers in the 200-250 range but these only occur when he is eating something he knows he shouldn't. He uses Apidra only for sliding scale and that is helpful for the high numbers. He'll often take roughly 25 units when he reaches the 250 range. He continues his Lantus 100 units daily and wakes up in the morning sugars around 100. Denies polyuria polydipsia. No recent illness, fevers, chills, chest pain, palpitations, shortness of breath, GI or GU concerns. Is scheduled for gastric bypass with Roux-en-Y in [place] in December of this year. He has decreased his carbohydrate and by mouth intake and is pleased with his weight loss he is continuing to use naproxen for his chronic knee pain and finds that makes the pain tolerable.  Past Medical History  Diagnosis Date  . DM (diabetes mellitus), type 2, uncontrolled   . Acid reflux disease   . Sleep apnea     CPAP  . Hx of colonic polyps   . Diverticulosis of colon   . Elevated BP 07/14/2010  . Morbid obesity 03/27/2010  . DIABETES MELLITUS, TYPE II, UNCONTROLLED 07/05/2007  . 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 HYPERSOMNIA, ASSOCIATED WITH SLEEP APNEA 07/26/2008  . DIVERTICULOSIS, COLON 10/29/2008  . Diarrhea 06/13/2010  . COLONIC POLYPS, HX OF 10/29/2008  . Benign neoplasm of colon 07/05/2007  . ACID REFLUX DISEASE 07/05/2007  . Knee pain, left 10/10/2010  . Tear of meniscus of left knee 2012    Past Surgical History  Procedure Date  . Ankle surgery 1994  . Tonsillectomy age 70  . Colonoscopy polyps   . Knee arthroscopy 11/06/10    Left, torn meniscus  (repaired)    Family History  Problem Relation Age of Onset  . Diabetes Mother   . Hypertension Mother   . Stroke Mother   . Hyperlipidemia Mother   . Hypertension Father   . Colon polyps Father   . Heart attack Father 69  . Stroke Father   . Heart attack Brother   . Diabetes Brother   . Heart attack Brother     Multiple  . Diabetes Brother   . Other Brother     heart problems  . Diabetes Sister   . Obesity Brother   . Heart disease Brother   . Hypertension Maternal Grandmother   . ADD / ADHD Daughter     History   Social History  . Marital Status: Married    Spouse Name: N/A    Number of Children: N/A  . Years of Education: N/A   Occupational History  . Not on file.   Social History Main Topics  . Smoking status: Former Smoker -- 1.5 packs/day for 20 years    Types: Cigarettes    Quit date: 04/21/1991  . Smokeless tobacco: Never Used  . Alcohol Use: 0.0 oz/week    0 drink(s) per week      occasionaly- social  . Drug Use: No  . Sexually Active: Yes   Other Topics Concern  . Not on file   Social History Narrative  . No narrative on  file    Current Outpatient Prescriptions on File Prior to Visit  Medication Sig Dispense Refill  . aspirin 325 MG tablet Take 325 mg by mouth 2 (two) times daily.        . fenofibrate 160 MG tablet Take 160 mg by mouth daily.        . fish oil-omega-3 fatty acids 1000 MG capsule Take 2,400 mg by mouth 2 (two) times daily.        [name] glucose blood (FREESTYLE LITE) test strip 1 each by Other route as directed. Use as instructed       . insulin glargine (LANTUS) 100 UNIT/ML injection Inject 100 Units into the skin daily.        . insulin glulisine (APIDRA) 100 UNIT/ML injection Inject into the skin TID-BC PRN. Sliding scale prn with meals and qhs       . Lancets (FREESTYLE) lancets 1 each by Other route as directed. Use as instructed       . metFORMIN (GLUMETZA) 1000 MG (MOD) 24 hr tablet Take 1,000 mg by mouth 2 (two) times  daily.        . mometasone (ELOCON) 0.1 % cream Apply to affected area daily  45 g  1  . omeprazole (PRILOSEC) 20 MG capsule Take 20 mg by mouth daily.        . simvastatin (ZOCOR) 5 MG tablet Take 5 mg by mouth daily.          Allergies  Allergen Reactions  . Bee Venom   . Morphine     Review of Systems  Review of Systems  Constitutional: Negative for fever and malaise/fatigue.  HENT: Negative for congestion.   Eyes: Negative for discharge.  Respiratory: Negative for shortness of breath.   Cardiovascular: Negative for chest pain, palpitations and leg swelling.  Gastrointestinal: Negative for nausea, abdominal pain and diarrhea.  Genitourinary: Negative for dysuria.  Musculoskeletal: Negative for falls.  Skin: Negative for rash.  Neurological: Negative for loss of consciousness and headaches.  Endo/Heme/Allergies: Negative for polydipsia.  Psychiatric/Behavioral: Negative for depression and suicidal ideas. The patient is not nervous/anxious and does not have insomnia.     Objective  BP 146/88  Pulse 99  Temp(Src) 98.1 F (36.7 C) (Oral)  Ht 5' 8.25" (1.734 m)  Wt 317 lb 12.8 oz (144.153 kg)  BMI 47.97 kg/m2  SpO2 97%  Physical Exam  Physical Exam  Constitutional: He is oriented to person, place, and time and well-developed, well-nourished, and in no distress. No distress.       obese  HENT:  Head: Normocephalic and atraumatic.  Eyes: Conjunctivae are normal.  Neck: Neck supple. No thyromegaly present.  Cardiovascular: Normal rate, regular rhythm and normal heart sounds.   No murmur heard. Pulmonary/Chest: Effort normal and breath sounds normal. No respiratory distress.  Abdominal: He exhibits no distension and no mass. There is no tenderness.  Musculoskeletal: He exhibits no edema.  Neurological: He is alert and oriented to person, place, and time.  Skin: Skin is warm.  Psychiatric: Memory, affect and judgment normal.    Lab Results  Component Value Date    TSH 1.66 02/11/2011   Lab Results  Component Value Date   WBC 6.2 02/11/2011   HGB 14.3 02/11/2011   HCT 42.9 02/11/2011   MCV 85.5 02/11/2011   PLT 270 02/11/2011   Lab Results  Component Value Date   CREATININE 0.9 02/11/2011   BUN 15 02/11/2011   NA 141 02/11/2011  K 4.2 02/11/2011   CL 105 02/11/2011   CO2 25 02/11/2011   Lab Results  Component Value Date   ALT 34 02/11/2011   AST 33 02/11/2011   ALKPHOS 42 02/11/2011   BILITOT 0.7 02/11/2011   Lab Results  Component Value Date   CHOL 172 02/11/2011   Lab Results  Component Value Date   HDL 42.70 02/11/2011   No results found for this basename: [facility]   Lab Results  Component Value Date   TRIG 208.0* 02/11/2011   Lab Results  Component Value Date   CHOLHDL 4 02/11/2011     Assessment & Plan  DIABETES MELLITUS, TYPE II, UNCONTROLLED Doing much better at this time. Is using 100 units of Lantus daily an often only need his Apidra on a prn basis once a day. Uses his Metformin as prescribed. Is going to proceed with gastric bypass in December. Continue to minimize carbs  Morbid obesity Is continuing to loose weight and is very happy with his results. He is going to proceed with gastric bypass in December.  Knee pain, left Manageable with Naproxen given refills today  Elevated BP Mildly elevated today and persistent, will start Metoprolol 25 mg XL daily

## 2011-02-15 NOTE — Assessment & Plan Note (Signed)
Is continuing to loose weight and is very happy with his results. He is going to proceed with gastric bypass in December.

## 2011-02-15 NOTE — Assessment & Plan Note (Signed)
Manageable with Naproxen given refills today

## 2011-02-15 NOTE — Assessment & Plan Note (Signed)
Doing much better at this time. Is using 100 units of Lantus daily an often only need his Apidra on a prn basis once a day. Uses his Metformin as prescribed. Is going to proceed with gastric bypass in December. Continue to minimize carbs

## 2011-02-15 NOTE — Assessment & Plan Note (Addendum)
Mildly elevated today and persistent, will start Metoprolol 25 mg XL daily

## 2011-02-17 ENCOUNTER — Telehealth: Payer: Self-pay | Admitting: Internal Medicine

## 2011-02-17 NOTE — Telephone Encounter (Signed)
Pt is scheduled for gastric bypass surgery with Dr. Lily Peer at [facility] 04/06/11. Pt states he needs to have a colon done but isn't sure if he should have it before or after the surgery. Dr. Marina Goodell please advise.

## 2011-02-17 NOTE — Telephone Encounter (Signed)
If he is due, I would recommend colonoscopy before his gastric bypass surgery. I would set him up with propofol due to obesity and sleep apnea

## 2011-02-17 NOTE — Telephone Encounter (Signed)
Pt scheduled for previsit 03/02/11@8 :30am, Propofol colon scheduled with Dr. Marina Goodell 03/23/11@4pm . Pt aware of appt dates and times.

## 2011-03-02 ENCOUNTER — Ambulatory Visit (AMBULATORY_SURGERY_CENTER): Admitting: *Deleted

## 2011-03-02 ENCOUNTER — Ambulatory Visit (INDEPENDENT_AMBULATORY_CARE_PROVIDER_SITE_OTHER): Admitting: Family Medicine

## 2011-03-02 VITALS — Ht 69.0 in | Wt 322.2 lb

## 2011-03-02 VITALS — BP 107/71 | HR 89 | Temp 97.9°F | Ht 68.25 in | Wt 322.4 lb

## 2011-03-02 DIAGNOSIS — L570 Actinic keratosis: Secondary | ICD-10-CM

## 2011-03-02 DIAGNOSIS — Z1211 Encounter for screening for malignant neoplasm of colon: Secondary | ICD-10-CM

## 2011-03-02 DIAGNOSIS — I10 Essential (primary) hypertension: Secondary | ICD-10-CM

## 2011-03-02 DIAGNOSIS — R030 Elevated blood-pressure reading, without diagnosis of hypertension: Secondary | ICD-10-CM

## 2011-03-02 DIAGNOSIS — K219 Gastro-esophageal reflux disease without esophagitis: Secondary | ICD-10-CM

## 2011-03-02 DIAGNOSIS — IMO0001 Reserved for inherently not codable concepts without codable children: Secondary | ICD-10-CM

## 2011-03-02 MED ORDER — METOPROLOL SUCCINATE ER 25 MG PO TB24: 25.0000 mg | ORAL_TABLET | Freq: Every day | ORAL | Status: DC

## 2011-03-02 MED ORDER — RAMIPRIL 10 MG PO TABS: 10.0000 mg | ORAL_TABLET | Freq: Every day | ORAL | Status: DC

## 2011-03-02 MED ORDER — OMEPRAZOLE 20 MG PO CPDR: 20.0000 mg | DELAYED_RELEASE_CAPSULE | Freq: Every day | ORAL | Status: DC

## 2011-03-02 MED ORDER — PEG-KCL-NACL-NASULF-NA ASC-C 100 G PO SOLR: ORAL | Status: DC

## 2011-03-02 NOTE — Patient Instructions (Signed)
Actinic Keratosis Actinic keratosis is a growth on the skin that is considered to be precancerous. That means it could develop into skin cancer if it is not treated. About 1% of such growths will turn into skin cancer, therefore, it is important that the skin growth is removed. An actinic keratosis might be as small as a pinhead or as big as a quarter. They appear most often on areas of skin that get a lot of sun exposure throughout your life. These include the bald scalp, face, ears, lips, upper back and the backs of hands and forearms. An actinic keratosis usually looks like a scaly, rough spot of skin. Sometimes there might be a little tag of pink or gray skin growing off them.  CAUSES  Sun damage causes abnormal growth of skin cells. Actinic keratoses (more than one growth) are the result of sun damage. You are more likely to develop them if you:  Have light-colored skin.   Are older (actinic keratoses increases with age).   Sunburn easily.   Have spent a lot of time in the sun.   Have had a job that involves a lot of outdoor work, such as a Hydrographic surveyor.  SYMPTOMS   Blotchy, red and white patches of skin.   A skin patch that feels rough (like sandpaper), scaly or crusty.   Patches of dry, white skin on the lips.   A patch of skin that is thinner than normal.   Skin that is tender to the touch.   Although a rare symptom; an area of skin that bleeds.  DIAGNOSIS  To decide if you have actinic keratosis, your caregiver will probably:  Ask about symptoms you have noticed.   Ask about your history of exposure to the sun.   Ask about your overall health history.   Examine the skin that concerns you. The caregiver may want to look at skin on other parts of your body that have had a lot of sun exposure.   Order a biopsy. A biopsy is the removal of a small sample of tissue from the patch of skin. It is then examined for signs of cancer.  TREATMENT  Actinic keratosis can be  treated several ways. Most of the time, treatments can be done in a clinic or in your caregiver's office. Be sure to discuss the different options with your caregiver. They include:  Surgical removal (curettage). A special surgical instrument (a curette) is used to scrape the growth.   Cryosurgery. Liquid nitrogen is used to freeze the patch of skin. Often it is sprayed on the area. The growth will eventually fall off.   5-FU (5-fluorouracil) cream. The cream is applied several times a day for up to 4 weeks. The skin often becomes red and irritated, but the growths will go away. This method is often used if actinic keratoses or the skin is badly damaged.   Chemical peel. Chemicals are applied to the skin on a small spot. The outer layers of skin at that spot are then peeled off.   Photodynamic therapy. A drug (photosensitizing agent) is put on the skin before exposing the skin to a strong light. Together, the drug and strong light destroys the actinic keratoses.   Imiquimod cream. A medicine usually applied to growths on the face or scalp.  PROGNOSIS  Early treatment of actinic keratoses usually gets rid of the growths without further worries. RELATED COMPLICATIONS  Skin irritation or redness from the treatment.   Scarring where  the patch of skin was removed.   Development of skin cancer. This rarely happens if the growths are removed early on.  HOME CARE INSTRUCTIONS   An adhesive bandage, and possibly gauze, will cover the treated area.   Change and remove the bandage as directed by your caregiver.   Keep the treated area dry as directed by your caregiver.   Apply any creams that your caregiver prescribed. Follow the directions carefully.   To prevent future skin damage:   Wear sunscreen year-round, not just in the summer. The winter sun can damage skin, too.   Wear long-sleeved clothing and wide-brimmed hats.   When possible, avoid midday exposure to the sun.   If you want  to look tan, try sunless tanning products (lotions and sprays). Avoid tanning beds.   Commit to regularly checking your skin for new changes.   Visit a skin doctor (dermatologist ) every year for a skin exam.  SEEK MEDICAL CARE IF:   The treated area of skin does not heal and becomes more irritated, red or bloody.   You notice other patches of skin that are similar to the actinic keratoses that were treated.  Document Released: 07/03/2008 Document Revised: 12/17/2010 Document Reviewed: 07/03/2008 [facility] Patient Information 2012 [place], [place].

## 2011-03-05 ENCOUNTER — Other Ambulatory Visit: Payer: Self-pay

## 2011-03-05 DIAGNOSIS — I10 Essential (primary) hypertension: Secondary | ICD-10-CM

## 2011-03-08 NOTE — Assessment & Plan Note (Signed)
Continues to loose weight and watch his diet in preparation of his upcoming gastric bypass

## 2011-03-08 NOTE — Assessment & Plan Note (Signed)
Well controlled today on Metoprolol

## 2011-03-08 NOTE — Assessment & Plan Note (Signed)
Patient reports good numbers, no recent flares

## 2011-03-08 NOTE — Progress Notes (Signed)
Brent [name] [id number] [birth year] 03/08/2011      Progress Note-Follow Up  Subjective  Chief Complaint  Chief Complaint  Patient presents with  . Follow-up    1 month follow up    HPI  Patient is in the final stages of preparing for a gastric bypass surgery at [facility], he is anxious to proceed and has been good at watching his diet as directed. No recent illness, fevers, chill, cp, palp, gi of gu c/o  Past Medical History  Diagnosis Date  . DM (diabetes mellitus), type 2, uncontrolled   . Acid reflux disease   . Sleep apnea     CPAP  . Hx of colonic polyps   . Diverticulosis of colon   . Elevated BP 07/14/2010  . Morbid obesity 03/27/2010  . DIABETES MELLITUS, TYPE II, UNCONTROLLED 07/05/2007  . 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 HYPERSOMNIA, ASSOCIATED WITH SLEEP APNEA 07/26/2008  . DIVERTICULOSIS, COLON 10/29/2008  . Diarrhea 06/13/2010  . COLONIC POLYPS, HX OF 10/29/2008  . Benign neoplasm of colon 07/05/2007  . ACID REFLUX DISEASE 07/05/2007  . Knee pain, left 10/10/2010  . Tear of meniscus of left knee 2012    Past Surgical History  Procedure Date  . Ankle surgery 1994  . Tonsillectomy age 73  . Colonoscopy polyps   . Knee arthroscopy 11/06/10    Left, torn meniscus (repaired)    Family History  Problem Relation Age of Onset  . Diabetes Mother   . Hypertension Mother   . Stroke Mother   . Hyperlipidemia Mother   . Hypertension Father   . Colon polyps Father   . Heart attack Father 79  . Stroke Father   . Heart attack Brother   . Diabetes Brother   . Heart attack Brother     Multiple  . Diabetes Brother   . Other Brother     heart problems  . Diabetes Sister   . Obesity Brother   . Heart disease Brother   . Hypertension Maternal Grandmother   . ADD / ADHD Daughter   . Stomach cancer Neg Hx     History   Social History  . Marital Status: Married    Spouse Name: N/A    Number of Children: N/A  . Years of Education: N/A   Occupational  History  . Not on file.   Social History Main Topics  . Smoking status: Former Smoker -- 1.5 packs/day for 20 years    Types: Cigarettes    Quit date: 04/21/1991  . Smokeless tobacco: Never Used  . Alcohol Use: 0.0 oz/week    0 drink(s) per week      occasionaly- social  . Drug Use: No  . Sexually Active: Yes   Other Topics Concern  . Not on file   Social History Narrative  . No narrative on file    Current Outpatient Prescriptions on File Prior to Visit  Medication Sig Dispense Refill  . aspirin 325 MG tablet Take 325 mg by mouth 2 (two) times daily.        . fenofibrate 160 MG tablet Take 160 mg by mouth daily.        . fish oil-omega-3 fatty acids 1000 MG capsule Take 2,400 mg by mouth 2 (two) times daily.        [name] glucose blood (FREESTYLE LITE) test strip 1 each by Other route as directed. Use as instructed       . insulin glargine (LANTUS) 100  UNIT/ML injection Inject 100 Units into the skin daily.        . insulin glulisine (APIDRA) 100 UNIT/ML injection Inject into the skin TID-BC PRN. Sliding scale prn with meals and qhs       . Lancets (FREESTYLE) lancets 1 each by Other route as directed. Use as instructed       . loratadine (CLARITIN) 10 MG tablet Take 10 mg by mouth daily.        . metFORMIN (GLUMETZA) 1000 MG (MOD) 24 hr tablet Take 1,000 mg by mouth 2 (two) times daily.        . mometasone (ELOCON) 0.1 % cream Apply to affected area daily  45 g  1  . naproxen (NAPROSYN) 375 MG tablet Take 1 tablet (375 mg total) by mouth 2 (two) times daily as needed.  60 tablet  5  . simvastatin (ZOCOR) 5 MG tablet Take 5 mg by mouth daily.          Allergies  Allergen Reactions  . Bee Venom Anaphylaxis  . Morphine     hyperactive    Review of Systems  Review of Systems  Constitutional: Negative for fever and malaise/fatigue.  HENT: Negative for congestion.   Eyes: Negative for discharge.  Respiratory: Negative for shortness of breath.   Cardiovascular: Negative for  chest pain, palpitations and leg swelling.  Gastrointestinal: Negative for nausea, abdominal pain and diarrhea.  Genitourinary: Negative for dysuria.  Musculoskeletal: Negative for falls.  Skin: Negative for rash.  Neurological: Negative for loss of consciousness and headaches.  Endo/Heme/Allergies: Negative for polydipsia.  Psychiatric/Behavioral: Negative for depression and suicidal ideas. The patient is not nervous/anxious and does not have insomnia.     Objective  BP 107/71  Pulse 89  Temp(Src) 97.9 F (36.6 C) (Oral)  Ht 5' 8.25" (1.734 m)  Wt 322 lb 6.4 oz (146.24 kg)  BMI 48.66 kg/m2  SpO2 97%  Physical Exam  Physical Exam  Constitutional: He is oriented to person, place, and time and well-developed, well-nourished, and in no distress. No distress.  HENT:  Head: Normocephalic and atraumatic.  Eyes: Conjunctivae are normal.  Neck: Neck supple. No thyromegaly present.  Cardiovascular: Normal rate, regular rhythm and normal heart sounds.   No murmur heard. Pulmonary/Chest: Effort normal and breath sounds normal. No respiratory distress.  Abdominal: He exhibits no distension and no mass. There is no tenderness.  Musculoskeletal: He exhibits no edema.  Neurological: He is alert and oriented to person, place, and time.  Skin: Skin is warm.  Psychiatric: Memory, affect and judgment normal.    Lab Results  Component Value Date   TSH 1.66 02/11/2011   Lab Results  Component Value Date   WBC 6.2 02/11/2011   HGB 14.3 02/11/2011   HCT 42.9 02/11/2011   MCV 85.5 02/11/2011   PLT 270 02/11/2011   Lab Results  Component Value Date   CREATININE 0.9 02/11/2011   BUN 15 02/11/2011   NA 141 02/11/2011   K 4.2 02/11/2011   CL 105 02/11/2011   CO2 25 02/11/2011   Lab Results  Component Value Date   ALT 34 02/11/2011   AST 33 02/11/2011   ALKPHOS 42 02/11/2011   BILITOT 0.7 02/11/2011   Lab Results  Component Value Date   CHOL 172 02/11/2011   Lab Results    Component Value Date   HDL 42.70 02/11/2011   No results found for this basename: [facility]   Lab Results  Component Value Date  TRIG 208.0* 02/11/2011   Lab Results  Component Value Date   CHOLHDL 4 02/11/2011     Assessment & Plan  Morbid obesity Continues to loose weight and watch his diet in preparation of his upcoming gastric bypass  DIABETES MELLITUS, TYPE II, UNCONTROLLED Patient reports good numbers, no recent flares  Elevated BP Well controlled today on Metoprolol

## 2011-03-23 ENCOUNTER — Encounter: Payer: Self-pay | Admitting: Internal Medicine

## 2011-03-23 ENCOUNTER — Ambulatory Visit (AMBULATORY_SURGERY_CENTER): Admitting: Internal Medicine

## 2011-03-23 VITALS — BP 141/80 | HR 75 | Temp 98.5°F | Resp 15 | Ht 69.0 in | Wt 322.0 lb

## 2011-03-23 DIAGNOSIS — Z860100 Personal history of colon polyps, unspecified: Secondary | ICD-10-CM

## 2011-03-23 DIAGNOSIS — D126 Benign neoplasm of colon, unspecified: Secondary | ICD-10-CM

## 2011-03-23 DIAGNOSIS — Z1211 Encounter for screening for malignant neoplasm of colon: Secondary | ICD-10-CM

## 2011-03-23 MED ORDER — SODIUM CHLORIDE 0.9 % IV SOLN: 500.0000 mL | INTRAVENOUS | Status: DC

## 2011-03-23 NOTE — Patient Instructions (Signed)
Polyp F/u colonoscopy in 5 years  Please refer to blue and green discharge instruction sheets.

## 2011-03-23 NOTE — Progress Notes (Signed)
Patient did not experience any of the following events: a burn prior to discharge; a fall within the facility; wrong site/side/patient/procedure/implant event; or a hospital transfer or hospital admission upon discharge from the facility. (G8907) Patient did not have preoperative order for IV antibiotic SSI prophylaxis. (G8918)  

## 2011-03-24 ENCOUNTER — Telehealth: Payer: Self-pay

## 2011-03-24 NOTE — Telephone Encounter (Signed)
Left message on answering machine. 

## 2011-03-26 ENCOUNTER — Telehealth: Payer: Self-pay | Admitting: Internal Medicine

## 2011-03-26 NOTE — Telephone Encounter (Signed)
Pt is having gastric sleeve surgery done on 12/17. He had colon with Dr. Marina Goodell recently but just found out he needs an egd also. Scheduled pt for EGD 03/31/11@2 :30pm. Pt to call back and confirm appt and schedule previsit.

## 2011-03-27 ENCOUNTER — Other Ambulatory Visit: Payer: Self-pay | Admitting: Internal Medicine

## 2011-03-27 DIAGNOSIS — Z129 Encounter for screening for malignant neoplasm, site unspecified: Secondary | ICD-10-CM

## 2011-03-27 NOTE — Telephone Encounter (Signed)
Pt will keep egd appt for 12/11 and scheduled previsit for 12/10. Pt aware of appt dates and times.

## 2011-03-27 NOTE — Telephone Encounter (Signed)
Ok

## 2011-03-30 ENCOUNTER — Ambulatory Visit (AMBULATORY_SURGERY_CENTER): Admitting: *Deleted

## 2011-03-31 ENCOUNTER — Ambulatory Visit (AMBULATORY_SURGERY_CENTER): Admitting: Internal Medicine

## 2011-03-31 ENCOUNTER — Other Ambulatory Visit: Payer: Self-pay | Admitting: Internal Medicine

## 2011-03-31 ENCOUNTER — Encounter: Payer: Self-pay | Admitting: Internal Medicine

## 2011-03-31 VITALS — BP 119/68 | HR 80 | Temp 97.4°F | Resp 16 | Ht 68.0 in | Wt 318.0 lb

## 2011-03-31 DIAGNOSIS — K219 Gastro-esophageal reflux disease without esophagitis: Secondary | ICD-10-CM

## 2011-03-31 DIAGNOSIS — D131 Benign neoplasm of stomach: Secondary | ICD-10-CM

## 2011-03-31 MED ORDER — SODIUM CHLORIDE 0.9 % IV SOLN: 500.0000 mL | INTRAVENOUS | Status: DC

## 2011-03-31 NOTE — Progress Notes (Signed)
Patient did not experience any of the following events: a burn prior to discharge; a fall within the facility; wrong site/side/patient/procedure/implant event; or a hospital transfer or hospital admission upon discharge from the facility. (G8907) Patient did not have preoperative order for IV antibiotic SSI prophylaxis. (G8918)  

## 2011-03-31 NOTE — Op Note (Signed)
[facility] [address] [place], [place]  [address]  ENDOSCOPY PROCEDURE REPORT  PATIENT:  Brent [name], Brent [name]  MR#:  [id number] BIRTHDATE:  1955-09-11, 55 yrs. old  GENDER:  male  ENDOSCOPIST:  Wilhemina Bonito. Eda Keys, MD Referred by:  .Direct Self,  PROCEDURE DATE:  03/31/2011 PROCEDURE:  EGD with biopsy, 43239 ASA CLASS:  Class II INDICATIONS:  GERD ; pre-op bariatric surgery exam  MEDICATIONS:   Fentanyl 75 mcg IV, Versed 7 mg IV, These medications were titrated to patient response per physician's verbal order TOPICAL ANESTHETIC:  Cetacaine Spray  DESCRIPTION OF PROCEDURE:   After the risks benefits and alternatives of the procedure were thoroughly explained, informed consent was obtained.  The LB [id number] endoscope was introduced through the mouth and advanced to the second portion of the duodenum, without limitations.  The instrument was slowly withdrawn as the mucosa was fully examined. <<PROCEDUREIMAGES>>  The esophagus and gastroesophageal junction were completely normal in appearance.  There were multiple benign fundic gland type polyps identified. in the body/fundus of the stomach.  Otherwise normal stomach.  The duodenal bulb was normal in appearance, as was the postbulbar duodenum.    Retroflexed views revealed no abnormalities. CLO bx taken.   The scope was then withdrawn from the patient and the procedure completed.  COMPLICATIONS:  None  ENDOSCOPIC IMPRESSION: 1) Incidental, small, fundic gland type gastric polyps 2) OTHERWISE NORMAL EGD 3) GERD  RECOMMENDATIONS: 1) Rx CLO if positive 2) Continue PPI  ______________________________ Wilhemina Bonito. Eda Keys, MD  CC:  The Patient  n. eSIGNED:   Wilhemina Bonito. Eda Keys at 03/31/2011 03:30 PM  Milana Obey, [phone]

## 2011-04-01 ENCOUNTER — Telehealth: Payer: Self-pay | Admitting: *Deleted

## 2011-04-01 DIAGNOSIS — K219 Gastro-esophageal reflux disease without esophagitis: Secondary | ICD-10-CM | POA: Insufficient documentation

## 2011-04-01 NOTE — Telephone Encounter (Signed)
Message left for the patient

## 2011-04-02 DIAGNOSIS — K219 Gastro-esophageal reflux disease without esophagitis: Secondary | ICD-10-CM

## 2011-04-02 LAB — HELICOBACTER PYLORI SCREEN-BIOPSY: UREASE: NEGATIVE

## 2011-06-08 ENCOUNTER — Ambulatory Visit (INDEPENDENT_AMBULATORY_CARE_PROVIDER_SITE_OTHER): Admitting: Family Medicine

## 2011-06-08 ENCOUNTER — Encounter: Payer: Self-pay | Admitting: Family Medicine

## 2011-06-08 DIAGNOSIS — IMO0001 Reserved for inherently not codable concepts without codable children: Secondary | ICD-10-CM

## 2011-06-08 DIAGNOSIS — M25569 Pain in unspecified knee: Secondary | ICD-10-CM

## 2011-06-08 DIAGNOSIS — M25562 Pain in left knee: Secondary | ICD-10-CM

## 2011-06-08 DIAGNOSIS — K219 Gastro-esophageal reflux disease without esophagitis: Secondary | ICD-10-CM

## 2011-06-08 DIAGNOSIS — R030 Elevated blood-pressure reading, without diagnosis of hypertension: Secondary | ICD-10-CM

## 2011-06-08 NOTE — Progress Notes (Signed)
Patient ID: Brent [name], male   DOB: 07/12/1955, 56 y.o.   MRN: [id number] Brent [name] [id number] 1955-08-02 06/08/2011      Progress Note-Follow Up  Subjective  Chief Complaint  Chief Complaint  Patient presents with  . Follow-up    HTN, DM    HPI  Patient is a 56 year old Caucasian male who is in today for followup status post his gastric bypass surgery. He is doing very well. He is down from a high of 331 pounds. He reports no complications after surgery except for some increased gaseousness. No abdominal pain, diarrhea, bloody stool or constipation. No chest pain, palpitations, shortness or breath or GU complaints today. No fevers, chills or malaise. He does have some increased difficulty with pain in his left knee since the surgery. He sees his endocrinologist next week to have a hemoglobin A1c and his cholesterol checked and will have the results forwarded to office.  Past Medical History  Diagnosis Date  . DM (diabetes mellitus), type 2, uncontrolled   . Acid reflux disease   . Sleep apnea     CPAP  . Hx of colonic polyps   . Diverticulosis of colon   . Elevated BP 07/14/2010  . Morbid obesity 03/27/2010  . DIABETES MELLITUS, TYPE II, UNCONTROLLED 07/05/2007  . 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 HYPERSOMNIA, ASSOCIATED WITH SLEEP APNEA 07/26/2008  . DIVERTICULOSIS, COLON 10/29/2008  . Diarrhea 06/13/2010  . COLONIC POLYPS, HX OF 10/29/2008  . Benign neoplasm of colon 07/05/2007  . ACID REFLUX DISEASE 07/05/2007  . Knee pain, left 10/10/2010  . Tear of meniscus of left knee 2012  . Hypertension     Past Surgical History  Procedure Date  . Ankle surgery 1994  . Tonsillectomy age 56  . Colonoscopy polyps   . Knee arthroscopy 11/06/10    Left, torn meniscus (repaired)  . Colonoscopy     Family History  Problem Relation Age of Onset  . Diabetes Mother   . Hypertension Mother   . Stroke Mother   . Hyperlipidemia Mother   . Hypertension Father   . Colon polyps  Father   . Heart attack Father 40  . Stroke Father   . Heart attack Brother   . Diabetes Brother   . Heart attack Brother     Multiple  . Diabetes Brother   . Other Brother     heart problems  . Diabetes Sister   . Obesity Brother   . Heart disease Brother   . Hypertension Maternal Grandmother   . ADD / ADHD Daughter   . Stomach cancer Neg Hx     History   Social History  . Marital Status: Married    Spouse Name: N/A    Number of Children: N/A  . Years of Education: N/A   Occupational History  . Not on file.   Social History Main Topics  . Smoking status: Former Smoker -- 1.5 packs/day for 20 years    Types: Cigarettes    Quit date: 04/21/1991  . Smokeless tobacco: Never Used  . Alcohol Use: 0.0 oz/week    0 drink(s) per week      occasionaly- social  . Drug Use: No  . Sexually Active: Yes   Other Topics Concern  . Not on file   Social History Narrative  . No narrative on file    Current Outpatient Prescriptions on File Prior to Visit  Medication Sig Dispense Refill  . fenofibrate 160 MG tablet  Take 160 mg by mouth daily.        . fish oil-omega-3 fatty acids 1000 MG capsule Take 2,400 mg by mouth 2 (two) times daily.        [name] glucose blood (FREESTYLE LITE) test strip 1 each by Other route as directed. Use as instructed       . Lancets (FREESTYLE) lancets 1 each by Other route as directed. Use as instructed       . loratadine (CLARITIN) 10 MG tablet Take 10 mg by mouth daily.        . metoprolol succinate (TOPROL XL) 25 MG 24 hr tablet Take 1 tablet (25 mg total) by mouth daily.  90 tablet  1  . mometasone (ELOCON) 0.1 % cream Apply to affected area daily  45 g  1  . Multiple Vitamin (MULTIVITAMIN) tablet Take 1 tablet by mouth daily.        [name] omeprazole (PRILOSEC) 20 MG capsule Take 1 capsule (20 mg total) by mouth daily.  90 capsule  1  . ramipril (ALTACE) 10 MG tablet Take 1 tablet (10 mg total) by mouth daily. 2 tabs po daily  180 tablet  1  .  simvastatin (ZOCOR) 5 MG tablet Take 5 mg by mouth daily.        [name] DISCONTD: aspirin 325 MG tablet Take 325 mg by mouth 2 (two) times daily.         Current Facility-Administered Medications on File Prior to Visit  Medication Dose Route Frequency Provider Last Rate Last Dose  . DISCONTD: 0.9 %  sodium chloride infusion  500 mL Intravenous Continuous [name], MD        Allergies  Allergen Reactions  . Bee Venom Anaphylaxis  . Morphine     hyperactive    Review of Systems  Review of Systems  Constitutional: Negative for fever and malaise/fatigue.  HENT: Negative for congestion.   Eyes: Negative for discharge.  Respiratory: Negative for shortness of breath.   Cardiovascular: Negative for chest pain, palpitations and leg swelling.  Gastrointestinal: Negative for heartburn, nausea, vomiting, abdominal pain, diarrhea, constipation, blood in stool and melena.  Genitourinary: Negative for dysuria.  Musculoskeletal: Positive for joint pain. Negative for falls.       Left knee pain somewhat worse since surgery  Skin: Negative for rash.  Neurological: Negative for loss of consciousness and headaches.  Endo/Heme/Allergies: Negative for polydipsia.  Psychiatric/Behavioral: Negative for depression and suicidal ideas. The patient is not nervous/anxious and does not have insomnia.     Objective  BP 119/83  Pulse 74  Temp(Src) 98.4 F (36.9 C) (Temporal)  Ht 5' 8.25" (1.734 m)  Wt 266 lb (120.657 kg)  BMI 40.15 kg/m2  SpO2 98%  Physical Exam  Physical Exam  Constitutional: He is oriented to person, place, and time and well-developed, well-nourished, and in no distress. No distress.  HENT:  Head: Normocephalic and atraumatic.  Eyes: Conjunctivae are normal.  Neck: Neck supple. No thyromegaly present.  Cardiovascular: Normal rate, regular rhythm and normal heart sounds.   No murmur heard. Pulmonary/Chest: Effort normal and breath sounds normal. No respiratory distress.    Abdominal: He exhibits no distension and no mass. There is no tenderness.  Musculoskeletal: He exhibits no edema.  Neurological: He is alert and oriented to person, place, and time.  Skin: Skin is warm.  Psychiatric: Memory, affect and judgment normal.    Lab Results  Component Value Date   TSH 1.66 02/11/2011  Lab Results  Component Value Date   WBC 6.2 02/11/2011   HGB 14.3 02/11/2011   HCT 42.9 02/11/2011   MCV 85.5 02/11/2011   PLT 270 02/11/2011   Lab Results  Component Value Date   CREATININE 0.9 02/11/2011   BUN 15 02/11/2011   NA 141 02/11/2011   K 4.2 02/11/2011   CL 105 02/11/2011   CO2 25 02/11/2011   Lab Results  Component Value Date   ALT 34 02/11/2011   AST 33 02/11/2011   ALKPHOS 42 02/11/2011   BILITOT 0.7 02/11/2011   Lab Results  Component Value Date   CHOL 172 02/11/2011   Lab Results  Component Value Date   HDL 42.70 02/11/2011   No results found for this basename: [facility]   Lab Results  Component Value Date   TRIG 208.0* 02/11/2011   Lab Results  Component Value Date   CHOLHDL 4 02/11/2011     Assessment & Plan  Morbid obesity Down from a high of 331, he is doing very well. He underwent gastric bypass without complications. Encouraged maintenance of-diet and increased activity.  DIABETES MELLITUS, TYPE II, UNCONTROLLED Sugars in the 90s in the evening and 110s to 120s in the morning. Sees his endocrinologist next week and they will check a hemoglobin A1c. He will have the results forwarded to our office.  ACID REFLUX DISEASE Surgery would like him to stay on Omeprazole, he is asymptomatic  Elevated BP Well controlled today  Knee pain, left Encouraged Krill oil daily and try topical Aspercreme bid when flared.

## 2011-06-08 NOTE — Patient Instructions (Addendum)
Knee Pain The knee is the complex joint between your thigh and your lower leg. It is made up of bones, tendons, ligaments, and cartilage. The bones that make up the knee are:  The femur in the thigh.   The tibia and fibula in the lower leg.   The patella or kneecap riding in the groove on the lower femur.  CAUSES  Knee pain is a common complaint with many causes. A few of these causes are:  Injury, such as:   A ruptured ligament or tendon injury.   Torn cartilage.   Medical conditions, such as:   Gout   Arthritis   Infections   Overuse, over training or overdoing a physical activity.  Knee pain can be minor or severe. Knee pain can accompany debilitating injury. Minor knee problems often respond well to self-care measures or get well on their own. More serious injuries may need medical intervention or even surgery. SYMPTOMS The knee is complex. Symptoms of knee problems can vary widely. Some of the problems are:  Pain with movement and weight bearing.   Swelling and tenderness.   Buckling of the knee.   Inability to straighten or extend your knee.   Your knee locks and you cannot straighten it.   Warmth and redness with pain and fever.   Deformity or dislocation of the kneecap.  DIAGNOSIS  Determining what is wrong may be very straight forward such as when there is an injury. It can also be challenging because of the complexity of the knee. Tests to make a diagnosis may include:  Your caregiver taking a history and doing a physical exam.   Routine X-rays can be used to rule out other problems. X-rays will not reveal a cartilage tear. Some injuries of the knee can be diagnosed by:   Arthroscopy a surgical technique by which a small video camera is inserted through tiny incisions on the sides of the knee. This procedure is used to examine and repair internal knee joint problems. Tiny instruments can be used during arthroscopy to repair the torn knee cartilage  (meniscus).   Arthrography is a radiology technique. A contrast liquid is directly injected into the knee joint. Internal structures of the knee joint then become visible on X-ray film.   An MRI scan is a non x-ray radiology procedure in which magnetic fields and a computer produce two- or three-dimensional images of the inside of the knee. Cartilage tears are often visible using an MRI scanner. MRI scans have largely replaced arthrography in diagnosing cartilage tears of the knee.   Blood work.   Examination of the fluid that helps to lubricate the knee joint (synovial fluid). This is done by taking a sample out using a needle and a syringe.  TREATMENT The treatment of knee problems depends on the cause. Some of these treatments are:  Depending on the injury, proper casting, splinting, surgery or physical therapy care will be needed.   Give yourself adequate recovery time. Do not overuse your joints. If you begin to get sore during workout routines, back off. Slow down or do fewer repetitions.   For repetitive activities such as cycling or running, maintain your strength and nutrition.   Alternate muscle groups. For example if you are a weight lifter, work the upper body on one day and the lower body the next.   Either tight or weak muscles do not give the proper support for your knee. Tight or weak muscles do not absorb the stress placed   on the knee joint. Keep the muscles surrounding the knee strong.   Take care of mechanical problems.   If you have flat feet, orthotics or special shoes may help. See your caregiver if you need help.   Arch supports, sometimes with wedges on the inner or outer aspect of the heel, can help. These can shift pressure away from the side of the knee most bothered by osteoarthritis.   A brace called an "unloader" brace also may be used to help ease the pressure on the most arthritic side of the knee.   If your caregiver has prescribed crutches, braces,  wraps or ice, use as directed. The acronym for this is PRICE. This means protection, rest, ice, compression and elevation.   Nonsteroidal anti-inflammatory drugs (NSAID's), can help relieve pain. But if taken immediately after an injury, they may actually increase swelling. Take NSAID's with food in your stomach. Stop them if you develop stomach problems. Do not take these if you have a history of ulcers, stomach pain or bleeding from the bowel. Do not take without your caregiver's approval if you have problems with fluid retention, heart failure, or kidney problems.   For ongoing knee problems, physical therapy may be helpful.   Glucosamine and chondroitin are over-the-counter dietary supplements. Both may help relieve the pain of osteoarthritis in the knee. These medicines are different from the usual anti-inflammatory drugs. Glucosamine may decrease the rate of cartilage destruction.   Injections of a corticosteroid drug into your knee joint may help reduce the symptoms of an arthritis flare-up. They may provide pain relief that lasts a few months. You may have to wait a few months between injections. The injections do have a small increased risk of infection, water retention and elevated blood sugar levels.   Hyaluronic acid injected into damaged joints may ease pain and provide lubrication. These injections may work by reducing inflammation. A series of shots may give relief for as long as 6 months.   Topical painkillers. Applying certain ointments to your skin may help relieve the pain and stiffness of osteoarthritis. Ask your pharmacist for suggestions. Many over the-counter products are approved for temporary relief of arthritis pain.   In some countries, doctors often prescribe topical NSAID's for relief of chronic conditions such as arthritis and tendinitis. A review of treatment with NSAID creams found that they worked as well as oral medications but without the serious side effects.    PREVENTION  Maintain a healthy weight. Extra pounds put more strain on your joints.   Get strong, stay limber. Weak muscles are a common cause of knee injuries. Stretching is important. Include flexibility exercises in your workouts.   Be smart about exercise. If you have osteoarthritis, chronic knee pain or recurring injuries, you may need to change the way you exercise. This does not mean you have to stop being active. If your knees ache after jogging or playing basketball, consider switching to swimming, water aerobics or other low-impact activities, at least for a few days a week. Sometimes limiting high-impact activities will provide relief.   Make sure your shoes fit well. Choose footwear that is right for your sport.   Protect your knees. Use the proper gear for knee-sensitive activities. Use kneepads when playing volleyball or laying carpet. Buckle your seat belt every time you drive. Most shattered kneecaps occur in car accidents.   Rest when you are tired.  SEEK MEDICAL CARE IF:  You have knee pain that is continual and does not   seem to be getting better.  SEEK IMMEDIATE MEDICAL CARE IF:  Your knee joint feels hot to the touch and you have a high fever. MAKE SURE YOU:   Understand these instructions.   Will watch your condition.   Will get help right away if you are not doing well or get worse.  Document Released: 02/01/2007 Document Revised: 12/17/2010 Document Reviewed: 02/01/2007 [facility] Patient Information 2012 [place], [place].  Try several probiotics, switch them up

## 2011-06-08 NOTE — Assessment & Plan Note (Signed)
Down from a high of 331, he is doing very well. He underwent gastric bypass without complications. Encouraged maintenance of-diet and increased activity.

## 2011-06-08 NOTE — Assessment & Plan Note (Signed)
Surgery would like him to stay on Omeprazole, he is asymptomatic

## 2011-06-08 NOTE — Assessment & Plan Note (Signed)
Encouraged Krill oil daily and try topical Aspercreme bid when flared.

## 2011-06-08 NOTE — Assessment & Plan Note (Signed)
Well controlled today.

## 2011-06-08 NOTE — Assessment & Plan Note (Signed)
Sugars in the 90s in the evening and 110s to 120s in the morning. Sees his endocrinologist next week and they will check a hemoglobin A1c. He will have the results forwarded to our office.

## 2011-07-14 ENCOUNTER — Other Ambulatory Visit: Payer: Self-pay | Admitting: Family Medicine

## 2011-10-14 DIAGNOSIS — E559 Vitamin D deficiency, unspecified: Secondary | ICD-10-CM | POA: Insufficient documentation

## 2011-10-28 ENCOUNTER — Telehealth: Payer: Self-pay | Admitting: Pulmonary Disease

## 2011-10-28 DIAGNOSIS — G4733 Obstructive sleep apnea (adult) (pediatric): Secondary | ICD-10-CM

## 2011-10-28 NOTE — Telephone Encounter (Signed)
I spoke with pt and he states he has lost weight and now his cpap mask does not fit him. He spoke with [facility] and was told they needed an order to refit him for another mask. I have sent order for this. Nothing further was needed

## 2011-11-17 ENCOUNTER — Other Ambulatory Visit: Payer: Self-pay | Admitting: Family Medicine

## 2011-11-17 ENCOUNTER — Ambulatory Visit (INDEPENDENT_AMBULATORY_CARE_PROVIDER_SITE_OTHER): Admitting: Family Medicine

## 2011-11-17 ENCOUNTER — Encounter: Payer: Self-pay | Admitting: Family Medicine

## 2011-11-17 VITALS — BP 130/79 | HR 72 | Temp 97.7°F | Ht 68.0 in | Wt 245.0 lb

## 2011-11-17 DIAGNOSIS — E119 Type 2 diabetes mellitus without complications: Secondary | ICD-10-CM

## 2011-11-17 DIAGNOSIS — K219 Gastro-esophageal reflux disease without esophagitis: Secondary | ICD-10-CM

## 2011-11-17 DIAGNOSIS — N644 Mastodynia: Secondary | ICD-10-CM | POA: Insufficient documentation

## 2011-11-17 DIAGNOSIS — IMO0001 Reserved for inherently not codable concepts without codable children: Secondary | ICD-10-CM

## 2011-11-17 DIAGNOSIS — G4710 Hypersomnia, unspecified: Secondary | ICD-10-CM

## 2011-11-17 DIAGNOSIS — T7840XA Allergy, unspecified, initial encounter: Secondary | ICD-10-CM

## 2011-11-17 DIAGNOSIS — Z9884 Bariatric surgery status: Secondary | ICD-10-CM

## 2011-11-17 DIAGNOSIS — I10 Essential (primary) hypertension: Secondary | ICD-10-CM

## 2011-11-17 DIAGNOSIS — E559 Vitamin D deficiency, unspecified: Secondary | ICD-10-CM

## 2011-11-17 DIAGNOSIS — Z0000 Encounter for general adult medical examination without abnormal findings: Secondary | ICD-10-CM

## 2011-11-17 DIAGNOSIS — G4730 Sleep apnea, unspecified: Secondary | ICD-10-CM

## 2011-11-17 HISTORY — DX: Mastodynia: N64.4

## 2011-11-17 HISTORY — DX: Encounter for general adult medical examination without abnormal findings: Z00.00

## 2011-11-17 LAB — RENAL FUNCTION PANEL
Albumin: 4.2 g/dL (ref 3.5–5.2)
BUN: 14 mg/dL (ref 6–23)
CO2: 29 mEq/L (ref 19–32)
Calcium: 10 mg/dL (ref 8.4–10.5)
Chloride: 103 mEq/L (ref 96–112)
Creatinine, Ser: 0.9 mg/dL (ref 0.4–1.5)
GFR: 96.44 mL/min (ref >60.00)
Glucose, Bld: 147 mg/dL — ABNORMAL HIGH (ref 70–99)
Phosphorus: 3 mg/dL (ref 2.3–4.6)
Potassium: 4.8 mEq/L (ref 3.5–5.1)
Sodium: 141 mEq/L (ref 135–145)

## 2011-11-17 LAB — CBC
HCT: 39.4 % (ref 39.0–52.0)
Hemoglobin: 13.2 g/dL (ref 13.0–17.0)
MCHC: 33.5 g/dL (ref 30.0–36.0)
MCV: 87.4 fl (ref 78.0–100.0)
Platelets: 198 10*3/uL (ref 150.0–400.0)
RBC: 4.51 Mil/uL (ref 4.22–5.81)
RDW: 13.7 % (ref 11.5–14.6)
WBC: 4.8 10*3/uL (ref 4.5–10.5)

## 2011-11-17 LAB — LIPID PANEL
Cholesterol: 125 mg/dL (ref 0–200)
HDL: 33.6 mg/dL — ABNORMAL LOW (ref >39.00)
LDL Cholesterol: 60 mg/dL (ref 0–99)
Total CHOL/HDL Ratio: 4
Triglycerides: 155 mg/dL — ABNORMAL HIGH (ref 0.0–149.0)
VLDL: 31 mg/dL (ref 0.0–40.0)

## 2011-11-17 LAB — PSA: PSA: 0.81 ng/mL (ref 0.10–4.00)

## 2011-11-17 LAB — HEPATIC FUNCTION PANEL
ALT: 26 U/L (ref 0–53)
AST: 29 U/L (ref 0–37)
Albumin: 4.2 g/dL (ref 3.5–5.2)
Alkaline Phosphatase: 48 U/L (ref 39–117)
Bilirubin, Direct: 0.2 mg/dL (ref 0.0–0.3)
Total Bilirubin: 0.8 mg/dL (ref 0.3–1.2)
Total Protein: 6.4 g/dL (ref 6.0–8.3)

## 2011-11-17 LAB — TSH: TSH: 1.42 u[IU]/mL (ref 0.35–5.50)

## 2011-11-17 LAB — HEMOGLOBIN A1C: Hgb A1c MFr Bld: 6 % (ref 4.6–6.5)

## 2011-11-17 MED ORDER — METOPROLOL TARTRATE 25 MG PO TABS: 12.5000 mg | ORAL_TABLET | Freq: Two times a day (BID) | ORAL | Status: DC

## 2011-11-17 MED ORDER — CETIRIZINE HCL 10 MG PO TABS: 10.0000 mg | ORAL_TABLET | Freq: Every day | ORAL | Status: DC | PRN

## 2011-11-17 MED ORDER — OMEPRAZOLE 20 MG PO CPDR: 20.0000 mg | DELAYED_RELEASE_CAPSULE | Freq: Every day | ORAL | Status: DC

## 2011-11-17 NOTE — Progress Notes (Signed)
Patient ID: Brent [name], male   DOB: 08/04/1955, 56 y.o.   MRN: [id number] Brent [name] [id number] 1955-06-13 11/17/2011      Progress Note New Patient  Subjective  Chief Complaint  Chief Complaint  Patient presents with  . Annual Exam    physical and labs    HPI  Patient is a 40 rolled Caucasian male who is in today for annual exam. He is doing very well. Continues to take full-time and goes and time. Will be done with his doctor education in the next 12 months. Continues to make good dietary and lifestyle changes. Is eating small frequent meals with lean proteins and complex carbs. Continues to lose weight status post his gastric bypass. Was told after his bypass that his vitamin D level was low. He is maintained on vitamin B12. He reports her sugars have been well-controlled. He is asked to stop metformin in the last week as her sugars are dropping low and he was feeling tremulous. He feels better since stopping it. He also noted occasional symptoms of lightheadedness and low blood pressure and try holding his metoprolol as well. Unfortunately with that or even half a pill he was beginning to trend high again so he stopped taking a full metoprolol 25 mg tablet of his metoprolol succinate and he feels somewhat better. He's not had any syncope, headaches, recent illness, fevers, chest pain, palpitations, shortness of breath, GI or GU complaints. He continues his omeprazole daily at the recommendation of his gastric bypass surgeon but denies any heartburn symptoms at present. Is using Zyrtec for his allergies with good control and offers no acute new complaints  Past Medical History  Diagnosis Date  . DM (diabetes mellitus), type 2, uncontrolled   . Acid reflux disease   . Sleep apnea     CPAP  . Hx of colonic polyps   . Diverticulosis of colon   . Elevated BP 07/14/2010  . Morbid obesity 03/27/2010  . DIABETES MELLITUS, TYPE II, UNCONTROLLED 07/05/2007  . 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 HYPERSOMNIA, ASSOCIATED WITH SLEEP APNEA 07/26/2008  . DIVERTICULOSIS, COLON 10/29/2008  . Diarrhea 06/13/2010  . COLONIC POLYPS, HX OF 10/29/2008  . Benign neoplasm of colon 07/05/2007  . ACID REFLUX DISEASE 07/05/2007  . Knee pain, left 10/10/2010  . Tear of meniscus of left knee 2012  . Hypertension   . HTN (hypertension) 07/14/2010  . Breast pain, left 11/17/2011  . Diabetes type 2, controlled 07/05/2007    Qualifier: Diagnosis of  By: Alwyn Ren MD, Chrissie Noa Preventative health care 11/17/2011    Past Surgical History  Procedure Date  . Ankle surgery 1994  . Tonsillectomy age 74  . Colonoscopy polyps   . Knee arthroscopy 11/06/10    Left, torn meniscus (repaired)  . Colonoscopy     Family History  Problem Relation Age of Onset  . Diabetes Mother   . Hypertension Mother   . Stroke Mother   . Hyperlipidemia Mother   . Hypertension Father   . Colon polyps Father   . Heart attack Father 17  . Stroke Father   . Heart attack Brother   . Diabetes Brother   . Heart disease Brother   . Heart attack Brother     Multiple  . Diabetes Brother   . Other Brother     heart problems  . Heart disease Brother   . Diabetes Sister   . Obesity Brother   .  Diabetes Brother   . Hypertension Maternal Grandmother   . ADD / ADHD Daughter   . Stomach cancer Neg Hx     History   Social History  . Marital Status: Married    Spouse Name: N/A    Number of Children: N/A  . Years of Education: N/A   Occupational History  . Not on file.   Social History Main Topics  . Smoking status: Former Smoker -- 1.5 packs/day for 20 years    Types: Cigarettes    Quit date: 04/21/1991  . Smokeless tobacco: Never Used  . Alcohol Use: 0.0 oz/week    0 drink(s) per week      occasionaly- social  . Drug Use: No  . Sexually Active: Yes   Other Topics Concern  . Not on file   Social History Narrative  . No narrative on file    Current Outpatient Prescriptions on File Prior to Visit    Medication Sig Dispense Refill  . aspirin 81 MG tablet Take 1 tablet (81 mg total) by mouth daily.      [name] CALCIUM CITRATE PO Take 1,500 mg by mouth daily.      . Cyanocobalamin (VITAMIN B-12) 1000 MCG SUBL Place 1 each under the tongue daily.      . fenofibrate 160 MG tablet Take 160 mg by mouth daily.        . fish oil-omega-3 fatty acids 1000 MG capsule Take 2,400 mg by mouth 2 (two) times daily.        [name] glucose blood (FREESTYLE LITE) test strip 1 each by Other route as directed. Use as instructed       . Lancets (FREESTYLE) lancets 1 each by Other route as directed. Use as instructed       . Multiple Vitamin (MULTIVITAMIN) tablet Take 1 tablet by mouth daily.        . simvastatin (ZOCOR) 5 MG tablet Take 5 mg by mouth daily.        . TOPROL XL 25 MG 24 hr tablet TAKE 1 TABLET BY MOUTH DAILY  90 tablet  0  . DISCONTD: omeprazole (PRILOSEC) 20 MG capsule Take 1 capsule (20 mg total) by mouth daily.  90 capsule  1  . cetirizine (ZYRTEC) 10 MG tablet Take 1 tablet (10 mg total) by mouth daily as needed for allergies.  30 tablet  0  . metoprolol tartrate (LOPRESSOR) 25 MG tablet Take 0.5 tablets (12.5 mg total) by mouth 2 (two) times daily.  90 tablet  3  . DISCONTD: omeprazole (PRILOSEC) 20 MG capsule TAKE 1 CAPSULE BY MOUTH DAILY  90 capsule  0  . DISCONTD: ramipril (ALTACE) 10 MG tablet Take 1 tablet (10 mg total) by mouth daily. 2 tabs po daily  180 tablet  1    Allergies  Allergen Reactions  . Bee Venom Anaphylaxis  . Morphine     hyperactive    Review of Systems  Review of Systems  Constitutional: Negative for fever, chills and malaise/fatigue.  HENT: Negative for hearing loss, nosebleeds and congestion.   Eyes: Negative for discharge.  Respiratory: Negative for cough, sputum production, shortness of breath and wheezing.   Cardiovascular: Negative for chest pain, palpitations and leg swelling.  Gastrointestinal: Negative for heartburn, nausea, vomiting, abdominal pain,  diarrhea, constipation and blood in stool.  Genitourinary: Negative for dysuria, urgency, frequency and hematuria.  Musculoskeletal: Negative for myalgias, back pain and falls.       Left breast pain, under  nipple with palpation a 4-5 of 10 since some time in June, steady intensity, no discharge, redness, mass noted  Skin: Negative for rash.  Neurological: Negative for dizziness, tremors, sensory change, focal weakness, loss of consciousness, weakness and headaches.  Endo/Heme/Allergies: Negative for polydipsia. Does not bruise/bleed easily.  Psychiatric/Behavioral: Negative for depression and suicidal ideas. The patient is not nervous/anxious and does not have insomnia.     Objective  BP 130/79  Pulse 72  Temp 97.7 F (36.5 C) (Temporal)  Ht 5\' 8"  (1.727 m)  Wt 245 lb (111.131 kg)  BMI 37.25 kg/m2  SpO2 100%  Physical Exam  Physical Exam  Constitutional: He is oriented to person, place, and time and well-developed, well-nourished, and in no distress. No distress.  HENT:  Head: Normocephalic and atraumatic.  Eyes: Conjunctivae are normal.  Neck: Neck supple. No thyromegaly present.  Cardiovascular: Normal rate, regular rhythm and normal heart sounds.   No murmur heard. Pulmonary/Chest: Effort normal and breath sounds normal. No respiratory distress.  Abdominal: He exhibits no distension and no mass. There is no tenderness.  Genitourinary:       Breast exam, no masses, skin changes or nipple discharge b/l, does note pain with palp of left nipple  Musculoskeletal: He exhibits no edema.  Neurological: He is alert and oriented to person, place, and time.  Skin: Skin is warm.  Psychiatric: Memory, affect and judgment normal.       Assessment & Plan  HTN (hypertension) Well controlled most of the time but he has had some low numbers and tried weaning off at times. Will switch him to Metoprolol tartrate 25 mg 1/2 tab po bid and he will manage and monitor his bp and call us if  it fluctuates  Breast pain, left Nipple pain x 1-2 months level 4-5 of 10 with palpation, check an [place], call  If worsen  Diabetes type 2, controlled Doing much better with weight loss and dietary changes. Off metformin x 2 weeks, check hgba1c today  HYPERSOMNIA, ASSOCIATED WITH SLEEP APNEA compliant  Morbid obesity Great weight loss s/p gastric bypass, continue heart healthy dit and encouraged exercise  Preventative health care Encouraged heart healthy diet, exercise and seat belt use. Check fasting labs including PSA, discussed risk benefit ofchecking PSA

## 2011-11-17 NOTE — Patient Instructions (Addendum)

## 2011-11-17 NOTE — Assessment & Plan Note (Signed)
Great weight loss s/p gastric bypass, continue heart healthy dit and encouraged exercise

## 2011-11-17 NOTE — Assessment & Plan Note (Signed)
compliant

## 2011-11-17 NOTE — Assessment & Plan Note (Signed)
Well controlled most of the time but he has had some low numbers and tried weaning off at times. Will switch him to Metoprolol tartrate 25 mg 1/2 tab po bid and he will manage and monitor his bp and call us if it fluctuates

## 2011-11-17 NOTE — Assessment & Plan Note (Signed)
Nipple pain x 1-2 months level 4-5 of 10 with palpation, check an [place], call  If worsen

## 2011-11-17 NOTE — Assessment & Plan Note (Signed)
Doing much better with weight loss and dietary changes. Off metformin x 2 weeks, check hgba1c today

## 2011-11-17 NOTE — Assessment & Plan Note (Signed)
Encouraged heart healthy diet, exercise and seat belt use. Check fasting labs including PSA, discussed risk benefit ofchecking PSA

## 2011-11-18 ENCOUNTER — Other Ambulatory Visit: Payer: Self-pay | Admitting: Family Medicine

## 2011-11-18 LAB — VITAMIN B12: Vitamin B-12: 1482 pg/mL — ABNORMAL HIGH (ref 211–911)

## 2011-11-19 ENCOUNTER — Other Ambulatory Visit: Payer: Self-pay | Admitting: Family Medicine

## 2011-11-19 DIAGNOSIS — N644 Mastodynia: Secondary | ICD-10-CM

## 2011-11-19 LAB — VITAMIN D 1,25 DIHYDROXY
Vitamin D 1, 25 (OH)2 Total: 49 pg/mL (ref 18–72)
Vitamin D2 1, 25 (OH)2: <8 pg/mL
Vitamin D3 1, 25 (OH)2: 49 pg/mL

## 2011-11-25 ENCOUNTER — Ambulatory Visit
Admission: RE | Admit: 2011-11-25 | Discharge: 2011-11-25 | Disposition: A | Discharge status: Home / Self Care | Source: Ambulatory Visit | Attending: Family Medicine | Admitting: Family Medicine

## 2011-11-25 DIAGNOSIS — N644 Mastodynia: Secondary | ICD-10-CM

## 2011-12-04 ENCOUNTER — Encounter: Admitting: Family Medicine

## 2011-12-07 ENCOUNTER — Encounter: Admitting: Family Medicine

## 2011-12-21 ENCOUNTER — Encounter (HOSPITAL_COMMUNITY): Payer: Self-pay | Admitting: Emergency Medicine

## 2011-12-21 ENCOUNTER — Inpatient Hospital Stay (HOSPITAL_COMMUNITY)
Admission: EM | Admit: 2011-12-21 | Discharge: 2011-12-24 | DRG: 287 | Disposition: A | Discharge status: Home / Self Care | Attending: Internal Medicine | Admitting: Internal Medicine

## 2011-12-21 ENCOUNTER — Emergency Department (HOSPITAL_COMMUNITY)

## 2011-12-21 DIAGNOSIS — K219 Gastro-esophageal reflux disease without esophagitis: Secondary | ICD-10-CM | POA: Diagnosis present

## 2011-12-21 DIAGNOSIS — Z885 Allergy status to narcotic agent status: Secondary | ICD-10-CM

## 2011-12-21 DIAGNOSIS — E669 Obesity, unspecified: Secondary | ICD-10-CM | POA: Diagnosis present

## 2011-12-21 DIAGNOSIS — Z9089 Acquired absence of other organs: Secondary | ICD-10-CM

## 2011-12-21 DIAGNOSIS — E119 Type 2 diabetes mellitus without complications: Secondary | ICD-10-CM | POA: Diagnosis present

## 2011-12-21 DIAGNOSIS — D126 Benign neoplasm of colon, unspecified: Secondary | ICD-10-CM

## 2011-12-21 DIAGNOSIS — Z860100 Personal history of colon polyps, unspecified: Secondary | ICD-10-CM

## 2011-12-21 DIAGNOSIS — G4733 Obstructive sleep apnea (adult) (pediatric): Secondary | ICD-10-CM | POA: Diagnosis present

## 2011-12-21 DIAGNOSIS — Z9884 Bariatric surgery status: Secondary | ICD-10-CM

## 2011-12-21 DIAGNOSIS — K5730 Diverticulosis of large intestine without perforation or abscess without bleeding: Secondary | ICD-10-CM

## 2011-12-21 DIAGNOSIS — I2510 Atherosclerotic heart disease of native coronary artery without angina pectoris: Principal | ICD-10-CM | POA: Diagnosis present

## 2011-12-21 DIAGNOSIS — I25810 Atherosclerosis of coronary artery bypass graft(s) without angina pectoris: Secondary | ICD-10-CM

## 2011-12-21 DIAGNOSIS — Z8719 Personal history of other diseases of the digestive system: Secondary | ICD-10-CM

## 2011-12-21 DIAGNOSIS — E785 Hyperlipidemia, unspecified: Secondary | ICD-10-CM | POA: Diagnosis present

## 2011-12-21 DIAGNOSIS — Z87891 Personal history of nicotine dependence: Secondary | ICD-10-CM

## 2011-12-21 DIAGNOSIS — Z7982 Long term (current) use of aspirin: Secondary | ICD-10-CM

## 2011-12-21 DIAGNOSIS — R079 Chest pain, unspecified: Secondary | ICD-10-CM | POA: Diagnosis present

## 2011-12-21 DIAGNOSIS — Z6836 Body mass index (BMI) 36.0-36.9, adult: Secondary | ICD-10-CM

## 2011-12-21 DIAGNOSIS — I10 Essential (primary) hypertension: Secondary | ICD-10-CM | POA: Diagnosis present

## 2011-12-21 DIAGNOSIS — Z8249 Family history of ischemic heart disease and other diseases of the circulatory system: Secondary | ICD-10-CM

## 2011-12-21 HISTORY — DX: Chest pain, unspecified: R07.9

## 2011-12-21 LAB — CBC
HCT: 39.9 % (ref 39.0–52.0)
Hemoglobin: 13.8 g/dL (ref 13.0–17.0)
MCH: 29.5 pg (ref 26.0–34.0)
MCHC: 34.6 g/dL (ref 30.0–36.0)
MCV: 85.3 fL (ref 78.0–100.0)
Platelets: 243 10*3/uL (ref 150–400)
RBC: 4.68 MIL/uL (ref 4.22–5.81)
RDW: 13.2 % (ref 11.5–15.5)
WBC: 7.7 10*3/uL (ref 4.0–10.5)

## 2011-12-21 LAB — PROTIME-INR
INR: 0.93 (ref 0.00–1.49)
Prothrombin Time: 12.7 seconds (ref 11.6–15.2)

## 2011-12-21 LAB — COMPREHENSIVE METABOLIC PANEL
ALT: 17 U/L (ref 0–53)
AST: 28 U/L (ref 0–37)
Albumin: 4.2 g/dL (ref 3.5–5.2)
Alkaline Phosphatase: 49 U/L (ref 39–117)
BUN: 17 mg/dL (ref 6–23)
CO2: 23 mEq/L (ref 19–32)
Calcium: 9.3 mg/dL (ref 8.4–10.5)
Chloride: 105 mEq/L (ref 96–112)
Creatinine, Ser: 0.8 mg/dL (ref 0.50–1.35)
GFR calc Af Amer: >90 mL/min (ref >90)
GFR calc non Af Amer: >90 mL/min (ref >90)
Glucose, Bld: 101 mg/dL — ABNORMAL HIGH (ref 70–99)
Potassium: 3.8 mEq/L (ref 3.5–5.1)
Sodium: 139 mEq/L (ref 135–145)
Total Bilirubin: 0.5 mg/dL (ref 0.3–1.2)
Total Protein: 6.7 g/dL (ref 6.0–8.3)

## 2011-12-21 LAB — APTT: aPTT: 33 seconds (ref 24–37)

## 2011-12-21 LAB — POCT I-STAT TROPONIN I: Troponin i, poc: 0 ng/mL (ref 0.00–0.08)

## 2011-12-21 MED ORDER — NITROGLYCERIN 0.4 MG SL SUBL
0.4000 mg | SUBLINGUAL_TABLET | SUBLINGUAL | Status: AC | PRN
  Administered 2011-12-21 (×3): 0.4 mg via SUBLINGUAL
  Filled 2011-12-21: qty 25

## 2011-12-21 MED ORDER — NITROGLYCERIN 2 % TD OINT
1.0000 [in_us] | TOPICAL_OINTMENT | Freq: Four times a day (QID) | TRANSDERMAL | Status: DC
  Administered 2011-12-21 – 2011-12-23 (×7): 1 [in_us] via TOPICAL
  Filled 2011-12-21 (×2): qty 30

## 2011-12-21 MED ORDER — FENTANYL CITRATE 0.05 MG/ML IJ SOLN
50.0000 ug | Freq: Once | INTRAMUSCULAR | Status: AC
  Administered 2011-12-21: 50 ug via INTRAVENOUS
  Filled 2011-12-21: qty 2

## 2011-12-21 MED ORDER — SODIUM CHLORIDE 0.9 % IV SOLN
1000.0000 mL | INTRAVENOUS | Status: DC
  Administered 2011-12-21: 1000 mL via INTRAVENOUS

## 2011-12-21 MED ORDER — ASPIRIN 81 MG PO CHEW
324.0000 mg | CHEWABLE_TABLET | Freq: Once | ORAL | Status: DC
  Filled 2011-12-21: qty 4

## 2011-12-21 NOTE — H&P (Addendum)
[facility] History and Physical  Brent [name] VHQ:[id number] DOB: October 13, 1955 DOA: 12/21/2011  Referring physician: ER physician PCP: Danise Edge, MD   Chief Complaint: chest pain  HPI:  56 year old male with history of HTN, Diabetes, Dyslipidemia who presented to ED with sudden onset left sided chest pain started about 1 hours prior to eating. Pain was constant, radiating to left shoulder associated with sweating but no nausea or vomiting. Patient reported pain to be sharp, 8/10 in intensity and not relieved with pain medications at home. Patient did report that the pain is better with nitroglycerin somewhat which he has received in ED. No complaints of palpitations, no shortness of breath, no abdominal pain. No lightheadedness or loss of consciousness.  Assessment and Plan:  Principal Problem:  *Chest pain - rule out ACS - 1st set cardiac enzymes negative - EKG on admission shows sinus tachycardia with poor R wave progression - follow up next 2 cardiac enzyme sets, lipid panel - follow up 2 D ECHO - continue aspirin and simvastatin  Active Problems:  Diabetes type 2, controlled - HgA1c 11/17/2011 was 6 indicating good glucose control - continue Glucophage   ACID REFLUX DISEASE - continue protonix   HTN (hypertension) - metoprolol 12.5 mg BID  Dyslipidemia - continue fenofibrate and simvastatin  Morbid obesity - diet education  Code Status: Full Family Communication: Pt at bedside Disposition Plan: Admit for further evaluation  Manson Passey, MD  [facility] Pager [phone]  If 7PM-7AM, please contact night-coverage [email or web address] Password [email or web address] 12/21/2011, 10:55 PM  Review of Systems:   Constitutional: Negative for fever, chills and malaise/fatigue. Negative for diaphoresis.  HENT: Negative for hearing loss, ear pain, nosebleeds, congestion, sore throat, neck pain, tinnitus and ear discharge.   Eyes: Negative for blurred vision, double  vision, photophobia, pain, discharge and redness.  Respiratory: Negative for cough, hemoptysis, sputum production, shortness of breath, wheezing and stridor.   Cardiovascular: positive for chest pain, negative for palpitations, orthopnea, claudication and leg swelling.  Gastrointestinal: Negative for vomiting and abdominal pain. Positive for heartburn, negative for constipation, blood in stool and melena.  Genitourinary: Negative for dysuria, urgency, frequency, hematuria and flank pain.  Musculoskeletal: Negative for myalgias, back pain, joint pain and falls.  Skin: Negative for itching and rash.  Neurological: Negative for dizziness and weakness. Negative for tingling, tremors, sensory change, speech change, focal weakness, loss of consciousness and headaches.  Endo/Heme/Allergies: Negative for environmental allergies and polydipsia. Does not bruise/bleed easily.  Psychiatric/Behavioral: Negative for suicidal ideas. The patient is not nervous/anxious.      Past Medical History  Diagnosis Date  . DM (diabetes mellitus), type 2, uncontrolled   . Acid reflux disease   . Sleep apnea     CPAP  . Hx of colonic polyps   . Diverticulosis of colon   . Elevated BP 07/14/2010  . Morbid obesity 03/27/2010  . DIABETES MELLITUS, TYPE II, UNCONTROLLED 07/05/2007  . 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 HYPERSOMNIA, ASSOCIATED WITH SLEEP APNEA 07/26/2008  . DIVERTICULOSIS, COLON 10/29/2008  . Diarrhea 06/13/2010  . COLONIC POLYPS, HX OF 10/29/2008  . Benign neoplasm of colon 07/05/2007  . ACID REFLUX DISEASE 07/05/2007  . Knee pain, left 10/10/2010  . Tear of meniscus of left knee 2012  . Hypertension   . HTN (hypertension) 07/14/2010  . Breast pain, left 11/17/2011  . Diabetes type 2, controlled 07/05/2007    Qualifier: Diagnosis of  By: Alwyn Ren MD, Chrissie Noa     .  Preventative health care 11/17/2011   Past Surgical History  Procedure Date  . Ankle surgery 1994  . Tonsillectomy age 76  . Colonoscopy  polyps   . Knee arthroscopy 11/06/10    Left, torn meniscus (repaired)  . Colonoscopy   . Gastric bypass   . Hernia repair    Social History:  reports that he quit smoking about 20 years ago. His smoking use included Cigarettes. He has a 30 pack-year smoking history. He has never used smokeless tobacco. He reports that he drinks alcohol. He reports that he does not use illicit drugs.  Allergies  Allergen Reactions  . Bee Venom Anaphylaxis  . Morphine     hyperactive    Family History: history of heart disease in father and brother  Prior to Admission medications   Medication Sig Start Date End Date Taking? Authorizing Provider  aspirin 81 MG tablet Take 1 tablet (81 mg total) by mouth daily. 06/08/11  Yes Bradd Canary, MD  CALCIUM CITRATE PO Take 1,500 mg by mouth daily.   Yes Historical Provider, MD  cetirizine (ZYRTEC) 10 MG tablet Take 10 mg by mouth daily. 11/17/11 11/16/12 Yes Bradd Canary, MD  Cyanocobalamin (VITAMIN B-12) 1000 MCG SUBL Place 1 each under the tongue daily.   Yes Historical Provider, MD  fenofibrate 160 MG tablet Take 160 mg by mouth daily.     Yes Historical Provider, MD  fish oil-omega-3 fatty acids 1000 MG capsule Take 2,400 mg by mouth 2 (two) times daily.     Yes Historical Provider, MD  glucose blood (FREESTYLE LITE) test strip 1 each by Other route as directed. Use as instructed    Yes Historical Provider, MD  Lancets (FREESTYLE) lancets 1 each by Other route as directed. Use as instructed    Yes Historical Provider, MD  metFORMIN (GLUCOPHAGE) 500 MG tablet Take 500 mg by mouth 2 (two) times daily with a meal.   Yes Historical Provider, MD  metoprolol succinate (TOPROL XL) 25 MG 24 hr tablet  07/14/11  Yes Bradd Canary, MD  Multiple Vitamin (MULTIVITAMIN) tablet Take 1 tablet by mouth daily.     Yes Historical Provider, MD  omeprazole (PRILOSEC) 20 MG capsule Take 20 mg by mouth daily. 11/17/11  Yes Bradd Canary, MD  simvastatin (ZOCOR) 5 MG tablet Take  5 mg by mouth daily.     Yes Historical Provider, MD  metoprolol tartrate (LOPRESSOR) 25 MG tablet Take 12.5 mg by mouth 2 (two) times daily. 11/17/11 11/16/12  Bradd Canary, MD   Physical Exam: Filed Vitals:   12/21/11 2159 12/21/11 2205 12/21/11 2211 12/21/11 2215  BP: 128/80 117/75 110/81 114/71  Pulse: 85 97 97 94  Temp:      TempSrc:      Resp: 16 20 18 17   SpO2: 96% 96% 95% 95%    Physical Exam  Constitutional: Appears well-developed and well-nourished. No distress.  HENT: Normocephalic. External right and left ear normal. Oropharynx is clear and moist.  Eyes: Conjunctivae and EOM are normal. PERRLA, no scleral icterus.  Neck: Normal ROM. Neck supple. No JVD. No tracheal deviation. No thyromegaly.  CVS: RRR, S1/S2 +, no murmurs, no gallops, no carotid bruit.  Pulmonary: Effort and breath sounds normal, no stridor, rhonchi, wheezes, rales.  Abdominal: Soft. BS +,  no distension, tenderness, rebound or guarding.  Musculoskeletal: Normal range of motion. No edema and no tenderness.  Lymphadenopathy: No lymphadenopathy noted, cervical, inguinal. Neuro: Alert. Normal reflexes, muscle tone  coordination. No cranial nerve deficit. Skin: Skin is warm and dry. No rash noted. Not diaphoretic. No erythema. No pallor.  Psychiatric: Normal mood and affect. Behavior, judgment, thought content normal.   Labs on Admission:  Basic Metabolic Panel:  Lab 12/21/11 3086  NA 139  K 3.8  CL 105  CO2 23  GLUCOSE 101*  BUN 17  CREATININE 0.80  CALCIUM 9.3   Liver Function Tests:  Lab 12/21/11 2137  AST 28  ALT 17  ALKPHOS 49  BILITOT 0.5  PROT 6.7  ALBUMIN 4.2   CBC:  Lab 12/21/11 2137  WBC 7.7  HGB 13.8  HCT 39.9  MCV 85.3  PLT 243   Radiological Exams on Admission: Dg Chest Portable 1 View 12/21/2011  * IMPRESSION: Stable examination.  No active cardiopulmonary process.        EKG: sinus tachycardia  Time spent: 85 minutes

## 2011-12-21 NOTE — ED Provider Notes (Signed)
History     CSN: [id number] Arrival date & time 12/21/11  2058 First MD Initiated Contact with Patient 12/21/11 2131      Chief Complaint  Patient presents with  . Chest Pain    HPI Comments: The patient had just finished eating dinner," [employer]".  The pain is in the left side of his chest and goes into his left shoulder. The pain is a deep radiating pain also goes down his arm  Patient is a 56 y.o. male presenting with chest pain. The history is provided by the patient.  Chest Pain Episode onset: Patient states he had sudden onset of chest pain about 1 hour ago. Chest pain occurs constantly. The chest pain is unchanged. The severity of the pain is severe. Pertinent negatives for primary symptoms include no fever, no syncope, no shortness of breath, no palpitations, no abdominal pain, no nausea and no vomiting. He tried nothing for the symptoms. Risk factors include male gender.  Pertinent negatives for past medical history include no CAD.  His family medical history is significant for CAD in family.    patient states she's had a stress test in the past that was normal.  He does have a strong family history of heart disease with his brothers and his father having heart disease  Past Medical History  Diagnosis Date  . DM (diabetes mellitus), type 2, uncontrolled   . Acid reflux disease   . Sleep apnea     CPAP  . Hx of colonic polyps   . Diverticulosis of colon   . Elevated BP 07/14/2010  . Morbid obesity 03/27/2010  . DIABETES MELLITUS, TYPE II, UNCONTROLLED 07/05/2007  . 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 HYPERSOMNIA, ASSOCIATED WITH SLEEP APNEA 07/26/2008  . DIVERTICULOSIS, COLON 10/29/2008  . Diarrhea 06/13/2010  . COLONIC POLYPS, HX OF 10/29/2008  . Benign neoplasm of colon 07/05/2007  . ACID REFLUX DISEASE 07/05/2007  . Knee pain, left 10/10/2010  . Tear of meniscus of left knee 2012  . Hypertension   . HTN (hypertension) 07/14/2010  . Breast pain, left 11/17/2011  .  Diabetes type 2, controlled 07/05/2007    Qualifier: Diagnosis of  By: Alwyn Ren MD, Chrissie Noa Preventative health care 11/17/2011    Past Surgical History  Procedure Date  . Ankle surgery 1994  . Tonsillectomy age 16  . Colonoscopy polyps   . Knee arthroscopy 11/06/10    Left, torn meniscus (repaired)  . Colonoscopy   . Gastric bypass   . Hernia repair     Family History  Problem Relation Age of Onset  . Diabetes Mother   . Hypertension Mother   . Stroke Mother   . Hyperlipidemia Mother   . Hypertension Father   . Colon polyps Father   . Heart attack Father 64  . Stroke Father   . Heart attack Brother   . Diabetes Brother   . Heart disease Brother   . Heart attack Brother     Multiple  . Diabetes Brother   . Other Brother     heart problems  . Heart disease Brother   . Diabetes Sister   . Obesity Brother   . Diabetes Brother   . Hypertension Maternal Grandmother   . ADD / ADHD Daughter   . Stomach cancer Neg Hx     History  Substance Use Topics  . Smoking status: Former Smoker -- 1.5 packs/day for 20 years    Types: Cigarettes  Quit date: 04/21/1991  . Smokeless tobacco: Never Used  . Alcohol Use: 0.0 oz/week    0 drink(s) per week      occasionaly- social      Review of Systems  Constitutional: Negative for fever.  Respiratory: Negative for shortness of breath.   Cardiovascular: Positive for chest pain. Negative for palpitations and syncope.  Gastrointestinal: Negative for nausea, vomiting and abdominal pain.  All other systems reviewed and are negative.    Allergies  Bee venom and Morphine  Home Medications   Current Outpatient Rx  Name Route Sig Dispense Refill  . ASPIRIN 81 MG PO TABS Oral Take 1 tablet (81 mg total) by mouth daily.    [name] CALCIUM CITRATE PO Oral Take 1,500 mg by mouth daily.    [name] CETIRIZINE HCL 10 MG PO TABS Oral Take 10 mg by mouth daily.    [name] VITAMIN B-12 1000 MCG SL SUBL Sublingual Place 1 each under the tongue daily.     . FENOFIBRATE 160 MG PO TABS Oral Take 160 mg by mouth daily.      . OMEGA-3 FATTY ACIDS 1000 MG PO CAPS Oral Take 2,400 mg by mouth 2 (two) times daily.      [name] GLUCOSE BLOOD VI STRP Other 1 each by Other route as directed. Use as instructed     . FREESTYLE LANCETS MISC Other 1 each by Other route as directed. Use as instructed     . METFORMIN HCL 500 MG PO TABS Oral Take 500 mg by mouth 2 (two) times daily with a meal.    . METOPROLOL SUCCINATE ER 25 MG PO TB24      . ONE-DAILY MULTI VITAMINS PO TABS Oral Take 1 tablet by mouth daily.      [name] OMEPRAZOLE 20 MG PO CPDR Oral Take 20 mg by mouth daily.    [name] SIMVASTATIN 5 MG PO TABS Oral Take 5 mg by mouth daily.      [name] METOPROLOL TARTRATE 25 MG PO TABS Oral Take 12.5 mg by mouth 2 (two) times daily.      BP 149/89  Pulse 105  Temp 98 F (36.7 C) (Oral)  Resp 18  SpO2 97%  Physical Exam  Nursing note and vitals reviewed. Constitutional: He appears well-developed and well-nourished. No distress.       Obese  HENT:  Head: Normocephalic and atraumatic.  Right Ear: External ear normal.  Left Ear: External ear normal.  Eyes: Conjunctivae are normal. Right eye exhibits no discharge. Left eye exhibits no discharge. No scleral icterus.  Neck: Neck supple. No tracheal deviation present.  Cardiovascular: Normal rate, regular rhythm and intact distal pulses.   Pulmonary/Chest: Effort normal and breath sounds normal. No stridor. No respiratory distress. He has no wheezes. He has no rales.  Abdominal: Soft. Bowel sounds are normal. He exhibits no distension. There is no tenderness. There is no rebound and no guarding.  Musculoskeletal: He exhibits no edema and no tenderness.  Neurological: He is alert. He has normal strength. No sensory deficit. Cranial nerve deficit:  no gross defecits noted. He exhibits normal muscle tone. He displays no seizure activity. Coordination normal.  Skin: Skin is warm and dry. No rash noted.  Psychiatric: He has a  normal mood and affect.    ED Course  Procedures (including critical care time)   Rate:101  Rhythm: Sinus tachycardia  QRS Axis: normal  Intervals: normal  ST/T Wave abnormalities: normal  Conduction Disutrbances:none  Narrative Interpretation: Poor  R-wave progression  Old EKG Reviewed: none available  Labs Reviewed  COMPREHENSIVE METABOLIC PANEL - Abnormal; Notable for the following:    Glucose, Bld 101 (*)     All other components within normal limits  CBC  POCT I-STAT TROPONIN I  PROTIME-INR  APTT   Dg Chest Portable 1 View  12/21/2011  *RADIOLOGY REPORT*  Clinical Data: Left chest, shoulder and back pain tonight. Hypoxia.  PORTABLE CHEST - 1 VIEW  Comparison: 10/29/2008 radiographs.  Findings: 2145 hours. The heart size and mediastinal contours are normal. The lungs are clear. There is no pleural effusion or pneumothorax. No acute osseous findings are identified. Telemetry leads overlie the chest.  IMPRESSION: Stable examination.  No active cardiopulmonary process.   Original Report Authenticated By: Gerrianne Scale, M.D.       MDM  The patient has cardiac risk factors of diabetes, hypertension and family history of heart disease. His symptoms are concerning for the possibility of acute coronary ischemia however his initial enzymes and EKG are reassuring. The initial presentation the pain is somewhat atypical in that started after eating however he does describe chest pressure radiating to his arm. I will consult the hospitalist for admission for further evaluation. We will continue to monitor him. Patient has been given nitroglycerin aspirin and fentanyl for pain        Celene Kras, MD 12/21/11 2232

## 2011-12-21 NOTE — ED Notes (Signed)
Pt presents with c/o chest pain that started about an hour ago  Pt states the pain is on the left side and goes up into the left shoulder and behind the shoulder  Pt describes the pain as a deep radiating pain

## 2011-12-21 NOTE — ED Notes (Signed)
Knapp, MD at bedside.  

## 2011-12-22 ENCOUNTER — Encounter (HOSPITAL_COMMUNITY): Payer: Self-pay | Admitting: Nurse Practitioner

## 2011-12-22 ENCOUNTER — Ambulatory Visit (HOSPITAL_COMMUNITY)
Admit: 2011-12-22 | Discharge: 2011-12-22 | Disposition: A | Discharge status: Home / Self Care | Attending: Nurse Practitioner | Admitting: Nurse Practitioner

## 2011-12-22 DIAGNOSIS — R079 Chest pain, unspecified: Secondary | ICD-10-CM

## 2011-12-22 LAB — CBC
HCT: 36.6 % — ABNORMAL LOW (ref 39.0–52.0)
HCT: 37.3 % — ABNORMAL LOW (ref 39.0–52.0)
Hemoglobin: 12.3 g/dL — ABNORMAL LOW (ref 13.0–17.0)
Hemoglobin: 12.8 g/dL — ABNORMAL LOW (ref 13.0–17.0)
MCH: 29 pg (ref 26.0–34.0)
MCH: 29.4 pg (ref 26.0–34.0)
MCHC: 33.6 g/dL (ref 30.0–36.0)
MCHC: 34.3 g/dL (ref 30.0–36.0)
MCV: 85.7 fL (ref 78.0–100.0)
MCV: 86.3 fL (ref 78.0–100.0)
Platelets: 209 10*3/uL (ref 150–400)
Platelets: 250 10*3/uL (ref 150–400)
RBC: 4.24 MIL/uL (ref 4.22–5.81)
RBC: 4.35 MIL/uL (ref 4.22–5.81)
RDW: 13 % (ref 11.5–15.5)
RDW: 13.3 % (ref 11.5–15.5)
WBC: 6.3 10*3/uL (ref 4.0–10.5)
WBC: 7.2 10*3/uL (ref 4.0–10.5)

## 2011-12-22 LAB — COMPREHENSIVE METABOLIC PANEL
ALT: 16 U/L (ref 0–53)
ALT: 16 U/L (ref 0–53)
AST: 21 U/L (ref 0–37)
AST: 21 U/L (ref 0–37)
Albumin: 3.9 g/dL (ref 3.5–5.2)
Albumin: 3.7 g/dL (ref 3.5–5.2)
Alkaline Phosphatase: 44 U/L (ref 39–117)
Alkaline Phosphatase: 41 U/L (ref 39–117)
BUN: 15 mg/dL (ref 6–23)
BUN: 13 mg/dL (ref 6–23)
CO2: 25 mEq/L (ref 19–32)
CO2: 26 mEq/L (ref 19–32)
Calcium: 8.9 mg/dL (ref 8.4–10.5)
Calcium: 8.5 mg/dL (ref 8.4–10.5)
Chloride: 104 mEq/L (ref 96–112)
Chloride: 105 mEq/L (ref 96–112)
Creatinine, Ser: 0.84 mg/dL (ref 0.50–1.35)
Creatinine, Ser: 0.85 mg/dL (ref 0.50–1.35)
GFR calc Af Amer: >90 mL/min (ref >90)
GFR calc Af Amer: >90 mL/min (ref >90)
GFR calc non Af Amer: >90 mL/min (ref >90)
GFR calc non Af Amer: >90 mL/min (ref >90)
Glucose, Bld: 110 mg/dL — ABNORMAL HIGH (ref 70–99)
Glucose, Bld: 145 mg/dL — ABNORMAL HIGH (ref 70–99)
Potassium: 3.3 mEq/L — ABNORMAL LOW (ref 3.5–5.1)
Potassium: 3.4 mEq/L — ABNORMAL LOW (ref 3.5–5.1)
Sodium: 139 mEq/L (ref 135–145)
Sodium: 139 mEq/L (ref 135–145)
Total Bilirubin: 0.5 mg/dL (ref 0.3–1.2)
Total Bilirubin: 0.6 mg/dL (ref 0.3–1.2)
Total Protein: 6.1 g/dL (ref 6.0–8.3)
Total Protein: 5.8 g/dL — ABNORMAL LOW (ref 6.0–8.3)

## 2011-12-22 LAB — LIPID PANEL
Cholesterol: 144 mg/dL (ref 0–200)
HDL: 41 mg/dL (ref >39)
LDL Cholesterol: 68 mg/dL (ref 0–99)
Total CHOL/HDL Ratio: 3.5 RATIO
Triglycerides: 173 mg/dL — ABNORMAL HIGH (ref <150)
VLDL: 35 mg/dL (ref 0–40)

## 2011-12-22 LAB — TSH: TSH: 3.05 u[IU]/mL (ref 0.350–4.500)

## 2011-12-22 LAB — GLUCOSE, CAPILLARY
Glucose-Capillary: 151 mg/dL — ABNORMAL HIGH (ref 70–99)
Glucose-Capillary: 177 mg/dL — ABNORMAL HIGH (ref 70–99)

## 2011-12-22 LAB — TROPONIN I
Troponin I: <0.3 ng/mL (ref <0.30)
Troponin I: <0.3 ng/mL (ref <0.30)
Troponin I: <0.3 ng/mL (ref <0.30)

## 2011-12-22 LAB — HEMOGLOBIN A1C
Hgb A1c MFr Bld: 6.3 % — ABNORMAL HIGH (ref <5.7)
Mean Plasma Glucose: 134 mg/dL — ABNORMAL HIGH (ref <117)

## 2011-12-22 LAB — DIFFERENTIAL
Basophils Absolute: 0 10*3/uL (ref 0.0–0.1)
Basophils Relative: 0 % (ref 0–1)
Eosinophils Absolute: 0.3 10*3/uL (ref 0.0–0.7)
Eosinophils Relative: 4 % (ref 0–5)
Lymphocytes Relative: 35 % (ref 12–46)
Lymphs Abs: 2.5 10*3/uL (ref 0.7–4.0)
Monocytes Absolute: 0.5 10*3/uL (ref 0.1–1.0)
Monocytes Relative: 7 % (ref 3–12)
Neutro Abs: 3.9 10*3/uL (ref 1.7–7.7)
Neutrophils Relative %: 54 % (ref 43–77)

## 2011-12-22 LAB — MAGNESIUM: Magnesium: 1.6 mg/dL (ref 1.5–2.5)

## 2011-12-22 LAB — PHOSPHORUS: Phosphorus: 3.6 mg/dL (ref 2.3–4.6)

## 2011-12-22 MED ORDER — LORATADINE 10 MG PO TABS
10.0000 mg | ORAL_TABLET | Freq: Every day | ORAL | Status: DC
  Administered 2011-12-22 – 2011-12-24 (×3): 10 mg via ORAL
  Filled 2011-12-22 (×3): qty 1

## 2011-12-22 MED ORDER — ADULT MULTIVITAMIN W/MINERALS CH
1.0000 | ORAL_TABLET | Freq: Every day | ORAL | Status: DC
  Administered 2011-12-22 – 2011-12-23 (×2): 1 via ORAL
  Filled 2011-12-22 (×3): qty 1

## 2011-12-22 MED ORDER — SODIUM CHLORIDE 0.9 % IV SOLN
1.0000 mL/kg/h | INTRAVENOUS | Status: DC
  Administered 2011-12-23 (×2): 1 mL/kg/h via INTRAVENOUS

## 2011-12-22 MED ORDER — ACETAMINOPHEN 325 MG PO TABS
650.0000 mg | ORAL_TABLET | Freq: Four times a day (QID) | ORAL | Status: DC | PRN
  Administered 2011-12-22: 650 mg via ORAL
  Filled 2011-12-22: qty 2

## 2011-12-22 MED ORDER — ONDANSETRON HCL 4 MG/2ML IJ SOLN: 4.0000 mg | Freq: Four times a day (QID) | INTRAMUSCULAR | Status: DC | PRN

## 2011-12-22 MED ORDER — DIAZEPAM 5 MG PO TABS: 5.0000 mg | ORAL_TABLET | ORAL | Status: DC

## 2011-12-22 MED ORDER — ONE-DAILY MULTI VITAMINS PO TABS: 1.0000 | ORAL_TABLET | Freq: Every day | ORAL | Status: DC

## 2011-12-22 MED ORDER — VITAMIN B-12 1000 MCG PO TABS
1000.0000 ug | ORAL_TABLET | Freq: Every day | ORAL | Status: DC
  Administered 2011-12-23: 1000 ug via ORAL
  Filled 2011-12-22 (×3): qty 1

## 2011-12-22 MED ORDER — SODIUM CHLORIDE 0.9 % IV SOLN
Freq: Once | INTRAVENOUS | Status: AC
  Administered 2011-12-22: 14:00:00 via INTRAVENOUS

## 2011-12-22 MED ORDER — METFORMIN HCL 500 MG PO TABS
500.0000 mg | ORAL_TABLET | Freq: Two times a day (BID) | ORAL | Status: DC
  Filled 2011-12-22 (×4): qty 1

## 2011-12-22 MED ORDER — METOPROLOL TARTRATE 50 MG PO TABS
50.0000 mg | ORAL_TABLET | Freq: Once | ORAL | Status: AC
  Administered 2011-12-22: 50 mg via ORAL
  Filled 2011-12-22: qty 1

## 2011-12-22 MED ORDER — NITROGLYCERIN 0.4 MG SL SUBL
0.4000 mg | SUBLINGUAL_TABLET | Freq: Once | SUBLINGUAL | Status: AC
  Administered 2011-12-22: 0.4 mg via SUBLINGUAL
  Filled 2011-12-22: qty 25

## 2011-12-22 MED ORDER — MAGNESIUM SULFATE 40 MG/ML IJ SOLN
2.0000 g | Freq: Once | INTRAMUSCULAR | Status: AC
  Administered 2011-12-22: 2 g via INTRAVENOUS
  Filled 2011-12-22: qty 50

## 2011-12-22 MED ORDER — ACETAMINOPHEN 650 MG RE SUPP: 650.0000 mg | Freq: Four times a day (QID) | RECTAL | Status: DC | PRN

## 2011-12-22 MED ORDER — PANTOPRAZOLE SODIUM 40 MG PO TBEC
40.0000 mg | DELAYED_RELEASE_TABLET | Freq: Every day | ORAL | Status: DC
  Administered 2011-12-22 – 2011-12-24 (×2): 40 mg via ORAL
  Filled 2011-12-22 (×3): qty 1

## 2011-12-22 MED ORDER — POTASSIUM CHLORIDE CRYS ER 20 MEQ PO TBCR
40.0000 meq | EXTENDED_RELEASE_TABLET | Freq: Once | ORAL | Status: DC
  Filled 2011-12-22: qty 2

## 2011-12-22 MED ORDER — VITAMIN B-12 1000 MCG SL SUBL: 1.0000 | SUBLINGUAL_TABLET | Freq: Every day | SUBLINGUAL | Status: DC

## 2011-12-22 MED ORDER — FENOFIBRATE 160 MG PO TABS
160.0000 mg | ORAL_TABLET | Freq: Every day | ORAL | Status: DC
  Administered 2011-12-22 – 2011-12-24 (×3): 160 mg via ORAL
  Filled 2011-12-22 (×3): qty 1

## 2011-12-22 MED ORDER — SIMVASTATIN 5 MG PO TABS
5.0000 mg | ORAL_TABLET | Freq: Every day | ORAL | Status: DC
  Administered 2011-12-22: 5 mg via ORAL
  Filled 2011-12-22 (×3): qty 1

## 2011-12-22 MED ORDER — SODIUM CHLORIDE 0.9 % IV SOLN
INTRAVENOUS | Status: AC
  Administered 2011-12-22: 01:00:00 via INTRAVENOUS

## 2011-12-22 MED ORDER — ASPIRIN 81 MG PO CHEW
324.0000 mg | CHEWABLE_TABLET | ORAL | Status: AC
  Administered 2011-12-23: 324 mg via ORAL
  Filled 2011-12-22: qty 4

## 2011-12-22 MED ORDER — ONDANSETRON HCL 4 MG PO TABS: 4.0000 mg | ORAL_TABLET | Freq: Four times a day (QID) | ORAL | Status: DC | PRN

## 2011-12-22 MED ORDER — HYDROMORPHONE HCL PF 1 MG/ML IJ SOLN: 1.0000 mg | INTRAMUSCULAR | Status: DC | PRN

## 2011-12-22 MED ORDER — POTASSIUM CHLORIDE 20 MEQ/15ML (10%) PO LIQD
40.0000 meq | Freq: Once | ORAL | Status: AC
  Administered 2011-12-22: 40 meq via ORAL
  Filled 2011-12-22: qty 30

## 2011-12-22 MED ORDER — IOHEXOL 350 MG/ML SOLN
80.0000 mL | Freq: Once | INTRAVENOUS | Status: AC | PRN
  Administered 2011-12-22: 80 mL via INTRAVENOUS

## 2011-12-22 MED ORDER — SODIUM CHLORIDE 0.9 % IJ SOLN
3.0000 mL | Freq: Two times a day (BID) | INTRAMUSCULAR | Status: DC
  Administered 2011-12-22 – 2011-12-23 (×2): 3 mL via INTRAVENOUS

## 2011-12-22 MED ORDER — METOPROLOL TARTRATE 12.5 MG HALF TABLET
12.5000 mg | ORAL_TABLET | Freq: Two times a day (BID) | ORAL | Status: DC
  Administered 2011-12-22 – 2011-12-24 (×6): 12.5 mg via ORAL
  Filled 2011-12-22 (×9): qty 1

## 2011-12-22 NOTE — Progress Notes (Addendum)
[facility] PROGRESS NOTE  Brent [name] ZOX:[id number] DOB: 03-11-1956 DOA: 12/21/2011 PCP: Brent Edge, MD  Assessment/Plan:   *Chest pain Patient chest pain appears to be cardiac in nature with symptoms at rest and radiation to his left shoulder blade and arm. He has significant risk for CAD including his age, 40 pack year smoking history, hx of morbid obesity, HTN, DM and strong family hx of CAD ( father had CAD in his 32s and both brother died of heart disease in their 9s) -his TIMI score is at least 3 ( positive risk for CAD, 2 or more anginal episode in 24 hrs and on aspirin past 7 days)  -continue telemetry monitoring Serial troponin so fat negative and EKG with not ST-T changes -continue aspirin at full dose ( he is on baby aspirin at home) , nitropaste and prn dilaudid for pain -o2 via [place] prn -2D echo ordered on admission and will follow -will get cardiology consult . patient followed with [facility] in past and had a stress test 8-10 yrs back -cont metoprolol and zocor    Active Problems:  Diabetes type 2, controlled Last A1C of 6 cotn SSI  Hold metformin  HTN  on metoprolol at home which will be continued   GERD: cont protonix  Code Status: full Family Communication: wife updated over the phone Disposition Plan: inpatient tele   Brief narrative: 56 y/o male with hx of morbid obesity ( s/p gastric bypass in 12/12), hx of 40 pack year smoking ( quit in 93) , HTN, DM, strong family hx of CAD , GERD admitted with acute left sided chest pain radiating to left shoulder and arm.   Consultants:  [facility] consulted  Procedures:  none  Antibiotics:  none  HPI/Subjective: Still has mild  chest soreness radiating to the left arm   Objective: Filed Vitals:   12/21/11 2331 12/22/11 0015 12/22/11 0110 12/22/11 0545  BP: 124/73 121/75 115/70 110/68  Pulse:  82 76 64  Temp: 98 F (36.7 C) 98 F (36.7 C)  97.7 F (36.5 C)  TempSrc:  Oral Oral  Oral  Resp:  18  16  Height:  5\' 8"  (1.727 m)    Weight:  110.5 kg (243 lb 9.7 oz)  109.9 kg (242 lb 4.6 oz)  SpO2: 99% 97%  97%    Intake/Output Summary (Last 24 hours) at 12/22/11 0837 Last data filed at 12/22/11 0700  Gross per 24 hour  Intake 718.75 ml  Output    315 ml  Net 403.75 ml   Filed Weights   12/22/11 0015 12/22/11 0545  Weight: 110.5 kg (243 lb 9.7 oz) 109.9 kg (242 lb 4.6 oz)    Exam:   General:  Obese male lying in bed in NAD  Cardiovascular: Ns1&s2, no murmurs  Respiratory: clear b/l, no added sounds  Abdomen: soft, NT, ND, BS+  Ext: warm, no edema  CNS: AAOX 3   Data Reviewed: Basic Metabolic Panel:  Lab 12/22/11 8119 12/22/11 0038 12/21/11 2137  NA 139 139 139  K 3.4* 3.3* 3.8  CL 105 104 105  CO2 26 25 23   GLUCOSE 145* 110* 101*  BUN 13 15 17   CREATININE 0.85 0.84 0.80  CALCIUM 8.5 8.9 9.3  MG -- 1.6 --  PHOS -- 3.6 --   Liver Function Tests:  Lab 12/22/11 0645 12/22/11 0038 12/21/11 2137  AST 21 21 28   ALT 16 16 17   ALKPHOS 41 44 49  BILITOT 0.6 0.5  0.5  PROT 5.8* 6.1 6.7  ALBUMIN 3.7 3.9 4.2   No results found for this basename: LIPASE:5,AMYLASE:5 in the last 168 hours No results found for this basename: AMMONIA:5 in the last 168 hours CBC:  Lab 12/22/11 0645 12/22/11 0038 12/21/11 2137  WBC 6.3 7.2 7.7  NEUTROABS -- 3.9 --  HGB 12.3* 12.8* 13.8  HCT 36.6* 37.3* 39.9  MCV 86.3 85.7 85.3  PLT 209 250 243   Cardiac Enzymes:  Lab 12/22/11 0645 12/22/11 0038  CKTOTAL -- --  CKMB -- --  CKMBINDEX -- --  TROPONINI <0.30 <0.30   BNP (last 3 results) No results found for this basename: PROBNP:3 in the last 8760 hours CBG: No results found for this basename: GLUCAP:5 in the last 168 hours  No results found for this or any previous visit (from the past 240 hour(s)).   Studies: Dg Chest Portable 1 View  12/21/2011  *RADIOLOGY REPORT*  Clinical Data: Left chest, shoulder and back pain tonight. Hypoxia.   PORTABLE CHEST - 1 VIEW  Comparison: 10/29/2008 radiographs.  Findings: 2145 hours. The heart size and mediastinal contours are normal. The lungs are clear. There is no pleural effusion or pneumothorax. No acute osseous findings are identified. Telemetry leads overlie the chest.  IMPRESSION: Stable examination.  No active cardiopulmonary process.   Original Report Authenticated By: Gerrianne Scale, M.D.    Mm Digital Diagnostic Bilat  11/25/2011  *RADIOLOGY REPORT*  Clinical Data:  Occasional subareolar left breast pain  DIGITAL DIAGNOSTIC BILATERAL MAMMOGRAM  WITH CAD  Comparison:  None.  Findings:  Scattered fibroglandular densities.  Mild bilateral subareolar glandular tissue without evidence of mass or architectural distortion, minimally greater on the left than on the right.  No mass or calcification.  Normal axillary lymph nodes bilaterally. Mammographic images were processed with CAD.  PHYSICAL EXAMINATION:  No palpable abnormalities.  IMPRESSION: Bilateral mildly asymmetric gynecomastia  BI-RADS CATEGORY 2:  Benign finding(s).  RECOMMENDATION: Manage on a clinical basis.We discussed possible causes of gynecomastia, as well as male breast cancer, and I asked the patient to report the potential development of any palpable abnormalities to his physician.  Original Report Authenticated By: [email or web address]    Scheduled Meds:   . sodium chloride   Intravenous STAT  . aspirin  324 mg Oral Once  . fenofibrate  160 mg Oral Daily  . fentaNYL  50 mcg Intravenous Once  . loratadine  10 mg Oral Daily  . metFORMIN  500 mg Oral BID WC  . metoprolol tartrate  12.5 mg Oral BID  . multivitamin with minerals  1 tablet Oral Daily  . nitroGLYCERIN  1 inch Topical Q6H  . pantoprazole  40 mg Oral Q1200  . potassium chloride  40 mEq Oral Once  . simvastatin  5 mg Oral Daily  . sodium chloride  3 mL Intravenous Q12H  . vitamin B-12  1,000 mcg Oral Daily  . DISCONTD: multivitamin  1 tablet Oral Daily  . DISCONTD: Vitamin  B-12  1 each Sublingual Daily   Continuous Infusions:   . DISCONTD: sodium chloride 1,000 mL (12/21/11 2213)       Time spent: 30 MINUTES    Brent [name]  [facility] Pager [phone]. If 8PM-8AM, please contact night-coverage at [email or web address], password [facility] 12/22/2011, 8:37 AM  LOS: 1 day      Was informed by cardiology PA that the coronary CT was abnormal and patient would be taken for cath in am.

## 2011-12-22 NOTE — Progress Notes (Signed)
Patient brought in home CPAP machine. RT visually looked at it seemed fine. RN paged MD to get order for home CPAP and RT called service response to come check home machine.

## 2011-12-22 NOTE — Progress Notes (Signed)
*  PRELIMINARY RESULTS* Echocardiogram 2D Echocardiogram has been performed.  Jeryl Columbia 12/22/2011, 11:28 AM

## 2011-12-22 NOTE — Progress Notes (Signed)
Pt tolerated CT heart well.  VSS post.  [facility] here for transport back to [facility].

## 2011-12-22 NOTE — Consult Note (Signed)
CARDIOLOGY CONSULT NOTE  Patient ID: Brent [name] MRN: [id number], DOB/AGE: [birth year]   Admit date: 12/21/2011 Date of Consult: 12/22/2011   Primary Physician: Danise Edge, MD Primary Cardiologist: Ellis Parents - seen by Golden Circle, MD  Pt. Profile  56 y/o male without prior cardiac history who was admitted with chest pain and has r/o.  Problem List  Past Medical History  Diagnosis Date  . DM (diabetes mellitus), type 2, uncontrolled   . Acid reflux disease   . Sleep apnea     CPAP  . Hx of colonic polyps   . Diverticulosis of colon   . Hypertension 07/14/2010  . Morbid obesity 03/27/2010    a. 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  . 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 HYPERSOMNIA, ASSOCIATED WITH SLEEP APNEA 07/26/2008  . DIVERTICULOSIS, COLON 10/29/2008  . Diarrhea 06/13/2010  . COLONIC POLYPS, HX OF 10/29/2008  . Benign neoplasm of colon 07/05/2007  . ACID REFLUX DISEASE 07/05/2007  . Knee pain, left 10/10/2010  . Tear of meniscus of left knee 2012  . HTN (hypertension) 07/14/2010  . Breast pain, left 11/17/2011  . Diabetes type 2, controlled 07/05/2007    Qualifier: Diagnosis of  By: Alwyn Ren MD, Chrissie Noa [facility] 11/17/2011    Past Surgical History  Procedure Date  . Ankle surgery 1994  . Tonsillectomy age 59  . Colonoscopy polyps   . Knee arthroscopy 11/06/10    Left, torn meniscus (repaired)  . Colonoscopy   . Gastric bypass   . Hernia repair     Allergies  Allergies  Allergen Reactions  . Bee Venom Anaphylaxis  . Morphine     hyperactive   HPI   55 y/o male with the above problem list.  He has a 5 yr h/o DM/HTN and is s/p Gastric Bypass in Dec 2012 with about 100 lb wt loss and improved diabetic/htn control.  Over this past weekend, he was fairly active outside, prepping for a pig-picking on Sunday and then subsequently cleaning up his yard Sunday night and a good portion of the day yesterday.  He has no limitations in his activities over the weekend.   Last night, after finishing his yard work, he showered and then sat in his recliner and subsequently developed 7/10 left upper chest and shoulder "aching" discomfort associated with mild left arm heaviness/wkns.  He felt warm though not diaphoretic and also denies sob, n, v, or dizziness.  After about 30 mins of Ss @ home, his wife, a nurse, drove him into the ED where he received 3 SL ntg tabs with reduction in discomfort to 4/10, where it has more or less stayed ever since.  He has never had complete relief from this discomfort.  It is not any worse with position changes, deep breathing, or palpation.  Despite prolonged Ss, his CE have been negative and his ECG is non-acute.  He currently reports 4/10 c/p, but is otherwise stable.  Of note, pt had a negative dobutamine echo @ [facility] prior to his gastric bx in December 2012.   Inpatient Medications    . sodium chloride   Intravenous STAT  . aspirin  324 mg Oral Once  . fenofibrate  160 mg Oral Daily  . fentaNYL  50 mcg Intravenous Once  . loratadine  10 mg Oral Daily  . magnesium sulfate 1 - 4 g bolus IVPB  2 g Intravenous Once  . metoprolol tartrate  12.5 mg Oral BID  .  multivitamin with minerals  1 tablet Oral Daily  . nitroGLYCERIN  1 inch Topical Q6H  . pantoprazole  40 mg Oral Q1200  . potassium chloride  40 mEq Oral Once  . simvastatin  5 mg Oral Daily  . sodium chloride  3 mL Intravenous Q12H  . vitamin B-12  1,000 mcg Oral Daily    Family History Family History  Problem Relation Age of Onset  . Diabetes Mother   . Hypertension Mother   . Stroke Mother   . Hyperlipidemia Mother   . Hypertension Father   . Colon polyps Father   . Heart attack Father 85  . Stroke Father   . Heart attack Brother   . Diabetes Brother   . Heart disease Brother   . Heart attack Brother     Multiple  . Diabetes Brother   . Other Brother     heart problems  . Heart disease Brother   . Diabetes Sister   . Obesity Brother   . Diabetes  Brother   . Hypertension Maternal Grandmother   . ADD / ADHD Daughter   . Stomach cancer Neg Hx     Social History History   Social History  . Marital Status: Married    Spouse Name: N/A    Number of Children: N/A  . Years of Education: N/A   Occupational History  . Not on file.   Social History Main Topics  . Smoking status: Former Smoker -- 1.5 packs/day for 20 years    Types: Cigarettes    Quit date: 04/21/1991  . Smokeless tobacco: Never Used  . Alcohol Use: 0.0 oz/week    0 drink(s) per week      occasionaly- social  . Drug Use: No  . Sexually Active: Yes   Other Topics Concern  . Not on file   Social History Narrative   Lives with wife in [place].  Does not routinely exercise.    Review of Systems  General:  No chills, fever, night sweats or weight changes.  Cardiovascular: +++ chest pain as outlined above.  No dyspnea on exertion, edema, orthopnea, palpitations, paroxysmal nocturnal dyspnea. Dermatological: No rash, lesions/masses Respiratory: No cough, dyspnea Urologic: No hematuria, dysuria Abdominal:   No nausea, vomiting, diarrhea, bright red blood per rectum, melena, or hematemesis Neurologic:  No visual changes, wkns, changes in mental status. All other systems reviewed and are otherwise negative except as noted above.  Physical Exam  Blood pressure 110/68, pulse 64, temperature 97.7 F (36.5 C), temperature source Oral, resp. rate 16, height 5\' 8"  (1.727 m), weight 242 lb 4.6 oz (109.9 kg), SpO2 97.00%.  General: Pleasant, NAD Psych: Normal affect. Neuro: Alert and oriented X 3. Moves all extremities spontaneously. HEENT: Normal  Neck: Supple without bruits or JVD. Lungs:  Resp regular and unlabored, CTA. Heart: RRR no s3, s4, or murmurs. Abdomen: Soft, non-tender, non-distended, BS + x 4.  Extremities: No clubbing, cyanosis or edema. DP/PT/Radials 2+ and equal bilaterally.  Labs   Basename 12/22/11 0645 12/22/11 0038  CKTOTAL -- --    CKMB -- --  TROPONINI <0.30 <0.30   Lab Results  Component Value Date   WBC 6.3 12/22/2011   HGB 12.3* 12/22/2011   HCT 36.6* 12/22/2011   MCV 86.3 12/22/2011   PLT 209 12/22/2011     Lab 12/22/11 0645  NA 139  K 3.4*  CL 105  CO2 26  BUN 13  CREATININE 0.85  CALCIUM 8.5  PROT 5.8*  BILITOT 0.6  ALKPHOS 41  ALT 16  AST 21  GLUCOSE 145*   Lab Results  Component Value Date   CHOL 144 12/21/2011   HDL 41 12/21/2011   LDLCALC 68 12/21/2011   TRIG 173* 12/21/2011   Radiology/Studies  Dg Chest Portable 1 View  12/21/2011  *RADIOLOGY REPORT*  Clinical Data: Left chest, shoulder and back pain tonight. Hypoxia.  PORTABLE CHEST - 1 VIEW  Comparison: 10/29/2008 radiographs.  Findings: 2145 hours. The heart size and mediastinal contours are normal. The lungs are clear. There is no pleural effusion or pneumothorax. No acute osseous findings are identified. Telemetry leads overlie the chest.  IMPRESSION: Stable examination.  No active cardiopulmonary process.   Original Report Authenticated By: Gerrianne Scale, M.D.    ECG  RSR, 62, inf q's (present on 2009 ecg).  ASSESSMENT AND PLAN  See below.  Signed, Nicolasa Ducking, NP 12/22/2011, 11:00 AM  Patient seen with PA, agree with above note . Patient has had prolonged chest pain with negative enzymes and unremarkable ECG.  He had a negative stress echo in 2012.  His story is atypical, but he has multiple CAD risk factors.  I will arrange for coronary CT angiogram to be done today.  If normal or low risk, can go home.  He will get a dose of beta blocker before study.   Marca Ancona 12/22/2011 11:10 AM

## 2011-12-22 NOTE — Plan of Care (Signed)
Problem: Food- and Nutrition-Related Knowledge Deficit (NB-1.1) Goal: Nutrition education Formal process to instruct or train a patient/client in a skill or to impart knowledge to help patients/clients voluntarily manage or modify food choices and eating behavior to maintain or improve health.  Outcome: Completed/Met Date Met:  12/22/11 Knowledge deficit resolved with diet education on 9/3.   Comments:  Discussed and provided handout obtained from ADA nutrition care manual for lowering sodium and cholesterol. We also discussed food label reading. Patient was eager to listen. Patient reported he takes Omega 3 supplement daily. Patient was without any nutrition related questions or concerns and verbalized understanding of the nutrition information discussed. Expect fair compliance.   RD available for nutrition needs.   Brent [name] [phone]

## 2011-12-22 NOTE — Progress Notes (Signed)
Pt received to [facility] for CT heart.  Tx via [facility] from [facility]  18 gauge IV started left AC for Ct heart.  Pt became diaphoretic, HR down to 40, BP 70/40s.  IV fluids started.  250 bolus given.  BP and pulse recovered within a few minutes.  Dr Shirlee Latch informed.  Proceeding with CT.

## 2011-12-23 ENCOUNTER — Encounter (HOSPITAL_COMMUNITY)
Admission: EM | Disposition: A | Discharge status: Home / Self Care | Payer: Self-pay | Source: Home / Self Care | Attending: Internal Medicine

## 2011-12-23 DIAGNOSIS — I2510 Atherosclerotic heart disease of native coronary artery without angina pectoris: Secondary | ICD-10-CM

## 2011-12-23 HISTORY — PX: LEFT HEART CATHETERIZATION WITH CORONARY ANGIOGRAM: SHX5451

## 2011-12-23 LAB — POCT ACTIVATED CLOTTING TIME
Activated Clotting Time: 174 seconds
Activated Clotting Time: 209 seconds
Activated Clotting Time: 234 seconds

## 2011-12-23 LAB — GLUCOSE, CAPILLARY
Glucose-Capillary: 134 mg/dL — ABNORMAL HIGH (ref 70–99)
Glucose-Capillary: 144 mg/dL — ABNORMAL HIGH (ref 70–99)
Glucose-Capillary: 100 mg/dL — ABNORMAL HIGH (ref 70–99)
Glucose-Capillary: 175 mg/dL — ABNORMAL HIGH (ref 70–99)

## 2011-12-23 LAB — BASIC METABOLIC PANEL
BUN: 10 mg/dL (ref 6–23)
CO2: 23 mEq/L (ref 19–32)
Calcium: 9 mg/dL (ref 8.4–10.5)
Chloride: 104 mEq/L (ref 96–112)
Creatinine, Ser: 0.72 mg/dL (ref 0.50–1.35)
GFR calc Af Amer: >90 mL/min (ref >90)
GFR calc non Af Amer: >90 mL/min (ref >90)
Glucose, Bld: 141 mg/dL — ABNORMAL HIGH (ref 70–99)
Potassium: 3.8 mEq/L (ref 3.5–5.1)
Sodium: 138 mEq/L (ref 135–145)

## 2011-12-23 SURGERY — LEFT HEART CATHETERIZATION WITH CORONARY ANGIOGRAM
Anesthesia: LOCAL

## 2011-12-23 MED ORDER — HEPARIN SODIUM (PORCINE) 1000 UNIT/ML IJ SOLN
INTRAMUSCULAR | Status: AC
  Filled 2011-12-23: qty 1

## 2011-12-23 MED ORDER — LIDOCAINE HCL (PF) 1 % IJ SOLN
INTRAMUSCULAR | Status: AC
  Filled 2011-12-23: qty 30

## 2011-12-23 MED ORDER — ADENOSINE 12 MG/4ML IV SOLN
16.0000 mL | Freq: Once | INTRAVENOUS | Status: DC
  Filled 2011-12-23: qty 16

## 2011-12-23 MED ORDER — SODIUM CHLORIDE 0.9 % IV SOLN: INTRAVENOUS | Status: AC

## 2011-12-23 MED ORDER — MIDAZOLAM HCL 2 MG/2ML IJ SOLN
INTRAMUSCULAR | Status: AC
  Filled 2011-12-23: qty 2

## 2011-12-23 MED ORDER — NITROGLYCERIN 0.2 MG/ML ON CALL CATH LAB
INTRAVENOUS | Status: AC
  Filled 2011-12-23: qty 1

## 2011-12-23 MED ORDER — VERAPAMIL HCL 2.5 MG/ML IV SOLN
INTRAVENOUS | Status: AC
  Filled 2011-12-23: qty 2

## 2011-12-23 MED ORDER — HEPARIN (PORCINE) IN NACL 2-0.9 UNIT/ML-% IJ SOLN
INTRAMUSCULAR | Status: AC
  Filled 2011-12-23: qty 2000

## 2011-12-23 MED ORDER — ACETAMINOPHEN 325 MG PO TABS
650.0000 mg | ORAL_TABLET | ORAL | Status: DC | PRN
  Administered 2011-12-24: 650 mg via ORAL
  Filled 2011-12-23: qty 2

## 2011-12-23 NOTE — Progress Notes (Signed)
Pt seen, found asleep already on his home cpap machine.  Pt resting comfortably.  Cpap unit appears to be working fine, no visible frays on cord or defects noted.  Request made via service response to have [employer] inspect his equipment.

## 2011-12-23 NOTE — Interval H&P Note (Signed)
History and Physical Interval Note:  12/23/2011 3:21 PM  Brent [name]  has presented today for surgery, with the diagnosis of cp  The various methods of treatment have been discussed with the patient and family. After consideration of risks, benefits and other options for treatment, the patient has consented to  Procedure(s) (LRB) with comments: LEFT HEART CATHETERIZATION WITH CORONARY ANGIOGRAM (N/A) as a surgical intervention .  The patient's history has been reviewed, patient examined, no change in status, stable for surgery.  I have reviewed the patient's chart and labs.  Questions were answered to the patient's satisfaction.     Rollene Rotunda

## 2011-12-23 NOTE — Progress Notes (Signed)
PROGRESS NOTE  Subjective:   Brent [name] is a 56 yo admitted with atypical CP.  CT angio of his coronaries revealed possible mid RCA stenosis.  He was admitted for further evaluation.  Cardiac enzymes are negative.   Objective:    Vital Signs:   Temp:  [97.7 F (36.5 C)-98.1 F (36.7 C)] 98.1 F (36.7 C) (09/04 0545) Pulse Rate:  [42-68] 62  (09/04 0545) Resp:  [14-18] 16  (09/04 0545) BP: (74-120)/(40-74) 116/74 mmHg (09/04 0545) SpO2:  [97 %-98 %] 97 % (09/04 0545) Weight:  [240 lb 4.8 oz (108.999 kg)] 240 lb 4.8 oz (108.999 kg) (09/04 0500)  Last BM Date: 12/22/11   24-hour weight change: Weight change: -3 lb 4.9 oz (-1.501 kg)  Weight trends: Filed Weights   12/22/11 0015 12/22/11 0545 12/23/11 0500  Weight: 243 lb 9.7 oz (110.5 kg) 242 lb 4.6 oz (109.9 kg) 240 lb 4.8 oz (108.999 kg)    Intake/Output:  09/03 0701 - 09/04 0700 In: 764.9 [P.O.:600; I.V.:164.9] Out: 1375 [Urine:1375]     Physical Exam: BP 116/74  Pulse 62  Temp 98.1 F (36.7 C) (Oral)  Resp 16  Ht 5\' 8"  (1.727 m)  Wt 240 lb 4.8 oz (108.999 kg)  BMI 36.54 kg/m2  SpO2 97%  General: Vital signs reviewed and noted. Well-developed, well-nourished, in no acute distress; alert, appropriate and cooperative .  Head: Normocephalic, atraumatic.  Eyes: conjunctivae/corneas clear.  EOM's intact.   Throat: normal  Neck: Supple. Normal carotids. No JVD  Lungs:  Clear to auscultation  Heart: Regular rate,  With normal  S1 S2. No murmurs, gallops or rubs  Abdomen:  Soft, non-tender, non-distended with normoactive bowel sounds. No hepatomegaly. No rebound/guarding. No abdominal masses.  Extremities: Distal pedal pulses are 2+ .  No edema.    Neurologic: A&O X3, CN II - XII are grossly intact. Motor strength is 5/5 in the all 4 extremities.  Psych: Responds to questions appropriately with normal affect.    Labs: BMET:  Basename 12/23/11 0439 12/22/11 0645 12/22/11 0038  NA 138 139 --  K 3.8 3.4* --    CL 104 105 --  CO2 23 26 --  GLUCOSE 141* 145* --  BUN 10 13 --  CREATININE 0.72 0.85 --  CALCIUM 9.0 8.5 --  MG -- -- 1.6  PHOS -- -- 3.6    Liver function tests:  Basename 12/22/11 0645 12/22/11 0038  AST 21 21  ALT 16 16  ALKPHOS 41 44  BILITOT 0.6 0.5  PROT 5.8* 6.1  ALBUMIN 3.7 3.9   No results found for this basename: LIPASE:2,AMYLASE:2 in the last 72 hours  CBC:  Basename 12/22/11 0645 12/22/11 0038  WBC 6.3 7.2  NEUTROABS -- 3.9  HGB 12.3* 12.8*  HCT 36.6* 37.3*  MCV 86.3 85.7  PLT 209 250    Cardiac Enzymes:  Basename 12/22/11 1145 12/22/11 0645 12/22/11 0038  CKTOTAL -- -- --  CKMB -- -- --  TROPONINI <0.30 <0.30 <0.30    Coagulation Studies:  Basename 12/21/11 2137  LABPROT 12.7  INR 0.93    Other: No components found with this basename: POCBNP:3 No results found for this basename: DDIMER in the last 72 hours  Basename 12/22/11 0038  HGBA1C 6.3*    Basename 12/21/11 2306  CHOL 144  HDL 41  LDLCALC 68  TRIG 173*  CHOLHDL 3.5    Basename 12/22/11 0038  TSH 3.050  T4TOTAL --  T3FREE --  THYROIDAB --  No results found for this basename: VITAMINB12,FOLATE,FERRITIN,TIBC,IRON,RETICCTPCT in the last 72 hours   Other results:  EKG : sinus tach, no ST /T abn.  Medications:    Infusions:    . sodium chloride 1 mL/kg/hr (12/23/11 0430)    Scheduled Medications:    . sodium chloride   Intravenous STAT  . aspirin  324 mg Oral Once  . aspirin  324 mg Oral Pre-Cath  . diazepam  5 mg Oral On Call  . fenofibrate  160 mg Oral Daily  . loratadine  10 mg Oral Daily  . magnesium sulfate 1 - 4 g bolus IVPB  2 g Intravenous Once  . metoprolol tartrate  50 mg Oral Once  . metoprolol tartrate  12.5 mg Oral BID  . multivitamin with minerals  1 tablet Oral Daily  . nitroGLYCERIN  1 inch Topical Q6H  . pantoprazole  40 mg Oral Q1200  . potassium chloride  40 mEq Oral Once  . simvastatin  5 mg Oral Daily  . sodium chloride  3 mL  Intravenous Q12H  . vitamin B-12  1,000 mcg Oral Daily  . DISCONTD: potassium chloride  40 mEq Oral Once    Assessment/ Plan:    Chest pain (12/21/2011) Coronary CT angio reveals possible significant stenosis in the mid RCA. He is scheduled for cath today. Risks, benefits, options discussed.   I answered questions concerning cath and possible PCI.  Diabetes type 2, controlled (07/05/2007) Plans per internal medicine  HTN (hypertension) (07/14/2010) BP is well controlled.  Disposition: for cath today. Length of Stay: 2  Vesta Mixer, Montez Hageman., MD, [facility] 12/23/2011, 8:48 AM Office [phone] Pager [phone]

## 2011-12-23 NOTE — H&P (View-Only) (Signed)
 PROGRESS NOTE  Subjective:   [name] is a 56 yo admitted with atypical CP.  CT angio of his coronaries revealed possible mid RCA stenosis.  He was admitted for further evaluation.  Cardiac enzymes are negative.   Objective:    Vital Signs:   Temp:  [97.7 F (36.5 C)-98.1 F (36.7 C)] 98.1 F (36.7 C) (09/04 0545) Pulse Rate:  [42-68] 62  (09/04 0545) Resp:  [14-18] 16  (09/04 0545) BP: (74-120)/(40-74) 116/74 mmHg (09/04 0545) SpO2:  [97 %-98 %] 97 % (09/04 0545) Weight:  [240 lb 4.8 oz (108.999 kg)] 240 lb 4.8 oz (108.999 kg) (09/04 0500)  Last BM Date: 12/22/11   24-hour weight change: Weight change: -3 lb 4.9 oz (-1.501 kg)  Weight trends: Filed Weights   12/22/11 0015 12/22/11 0545 12/23/11 0500  Weight: 243 lb 9.7 oz (110.5 kg) 242 lb 4.6 oz (109.9 kg) 240 lb 4.8 oz (108.999 kg)    Intake/Output:  09/03 0701 - 09/04 0700 In: 764.9 [P.O.:600; I.V.:164.9] Out: 1375 [Urine:1375]     Physical Exam: BP 116/74  Pulse 62  Temp 98.1 F (36.7 C) (Oral)  Resp 16  Ht 5' 8" (1.727 m)  Wt 240 lb 4.8 oz (108.999 kg)  BMI 36.54 kg/m2  SpO2 97%  General: Vital signs reviewed and noted. Well-developed, well-nourished, in no acute distress; alert, appropriate and cooperative .  Head: Normocephalic, atraumatic.  Eyes: conjunctivae/corneas clear.  EOM's intact.   Throat: normal  Neck: Supple. Normal carotids. No JVD  Lungs:  Clear to auscultation  Heart: Regular rate,  With normal  S1 S2. No murmurs, gallops or rubs  Abdomen:  Soft, non-tender, non-distended with normoactive bowel sounds. No hepatomegaly. No rebound/guarding. No abdominal masses.  Extremities: Distal pedal pulses are 2+ .  No edema.    Neurologic: A&O X3, CN II - XII are grossly intact. Motor strength is 5/5 in the all 4 extremities.  Psych: Responds to questions appropriately with normal affect.    Labs: BMET:  Basename 12/23/11 0439 12/22/11 0645 12/22/11 0038  NA 138 139 --  K 3.8 3.4* --    CL 104 105 --  CO2 23 26 --  GLUCOSE 141* 145* --  BUN 10 13 --  CREATININE 0.72 0.85 --  CALCIUM 9.0 8.5 --  MG -- -- 1.6  PHOS -- -- 3.6    Liver function tests:  Basename 12/22/11 0645 12/22/11 0038  AST 21 21  ALT 16 16  ALKPHOS 41 44  BILITOT 0.6 0.5  PROT 5.8* 6.1  ALBUMIN 3.7 3.9   No results found for this basename: LIPASE:2,AMYLASE:2 in the last 72 hours  CBC:  Basename 12/22/11 0645 12/22/11 0038  WBC 6.3 7.2  NEUTROABS -- 3.9  HGB 12.3* 12.8*  HCT 36.6* 37.3*  MCV 86.3 85.7  PLT 209 250    Cardiac Enzymes:  Basename 12/22/11 1145 12/22/11 0645 12/22/11 0038  CKTOTAL -- -- --  CKMB -- -- --  TROPONINI <0.30 <0.30 <0.30    Coagulation Studies:  Basename 12/21/11 2137  LABPROT 12.7  INR 0.93    Other: No components found with this basename: POCBNP:3 No results found for this basename: DDIMER in the last 72 hours  Basename 12/22/11 0038  HGBA1C 6.3*    Basename 12/21/11 2306  CHOL 144  HDL 41  LDLCALC 68  TRIG 173*  CHOLHDL 3.5    Basename 12/22/11 0038  TSH 3.050  T4TOTAL --  T3FREE --  THYROIDAB --     No results found for this basename: VITAMINB12,FOLATE,FERRITIN,TIBC,IRON,RETICCTPCT in the last 72 hours   Other results:  EKG : sinus tach, no ST /T abn.  Medications:    Infusions:    . sodium chloride 1 mL/kg/hr (12/23/11 0430)    Scheduled Medications:    . sodium chloride   Intravenous STAT  . aspirin  324 mg Oral Once  . aspirin  324 mg Oral Pre-Cath  . diazepam  5 mg Oral On Call  . fenofibrate  160 mg Oral Daily  . loratadine  10 mg Oral Daily  . magnesium sulfate 1 - 4 g bolus IVPB  2 g Intravenous Once  . metoprolol tartrate  50 mg Oral Once  . metoprolol tartrate  12.5 mg Oral BID  . multivitamin with minerals  1 tablet Oral Daily  . nitroGLYCERIN  1 inch Topical Q6H  . pantoprazole  40 mg Oral Q1200  . potassium chloride  40 mEq Oral Once  . simvastatin  5 mg Oral Daily  . sodium chloride  3 mL  Intravenous Q12H  . vitamin B-12  1,000 mcg Oral Daily  . DISCONTD: potassium chloride  40 mEq Oral Once    Assessment/ Plan:    Chest pain (12/21/2011) Coronary CT angio reveals possible significant stenosis in the mid RCA. He is scheduled for cath today. Risks, benefits, options discussed.   I answered questions concerning cath and possible PCI.  Diabetes type 2, controlled (07/05/2007) Plans per internal medicine  HTN (hypertension) (07/14/2010) BP is well controlled.  Disposition: for cath today. Length of Stay: 2  Nanie Dunkleberger J. Gelena Klosinski, Jr., MD, FACC 12/23/2011, 8:48 AM Office [phone] Pager [phone]    

## 2011-12-23 NOTE — Care Management Note (Signed)
    Page 1 of 1   12/23/2011     11:42:32 AM   CARE MANAGEMENT NOTE 12/23/2011  Patient:  Brent [name], Brent [name]   Account Number:  [id number]  Date Initiated:  12/23/2011  Documentation initiated by:  Lorenda Ishihara  Subjective/Objective Assessment:   56 yo male admitted with chest pain. PTA lived at home with spouse.     Action/Plan:   Plan for cath 12-23-11   Anticipated DC Date:  12/25/2011   Anticipated DC Plan:  HOME/SELF CARE      DC Planning Services  CM consult      Choice offered to / List presented to:             Status of service:  In process, will continue to follow Medicare Important Message given?   (If response is "NO", the following Medicare IM given date fields will be blank) Date Medicare IM given:   Date Additional Medicare IM given:    Discharge Disposition:    Per UR Regulation:  Reviewed for med. necessity/level of care/duration of stay  If discussed at Long Length of Stay Meetings, dates discussed:    Comments:

## 2011-12-23 NOTE — CV Procedure (Signed)
   Cardiac Catheterization Procedure Note  Name: JOFFRE LUCKS MRN: [id number] DOB: Aug 25, 1955  Procedure: Left Heart Cath, Selective Coronary Angiography, LV angiography  Indication:   Chest pain.  Coronary calcification suggesting a possible high grade RCA lesion.    Procedural details: The right groin was prepped, draped, and anesthetized with 1% lidocaine. Using modified Seldinger technique, a 5 French sheath was introduced into the right femoral artery. Standard Judkins catheters were used for coronary angiography and left ventriculography. Catheter exchanges were performed over a guidewire. There were no immediate procedural complications. The patient was transferred to the post catheterization recovery area for further monitoring.  Procedural Findings:   Hemodynamics:     AO 128/79    LV 137/11   Coronary angiography:   Coronary dominance: Right  Left mainstem:   Normal  Left anterior descending (LAD):   Proximal 50%.  Mild diffuse luminal irregularities.  D1 moderated sized with long 50% mid stenosis.  D2 and D3 small and mild irregularities.    Left circumflex (LCx):  AV groove mild luminal irregularities.  RI tiny with diffuse nonobstructive disease.  OM2 small and normal.  OM3 large with mid 30%  Right coronary artery (RCA):  Large.  Proximal 25%.  Mid 70%.  Mild luminal irregularities.  PDA moderate sized with mid 30% stenosis.  PL1 large with luminal irregularities.  PL2 small normal.    Left ventriculography: Left ventricular systolic function is normal, LVEF is estimated at 65%, there is no significant mitral regurgitation   Final Conclusions:  Diffuse nonobstructive disease.  Moderate RCA stenosis.  Recommendations: Aggressive risk reduction.  I will ask Dr. Clifton Dominiq Fontaine to evaluate the RCA with a pressure wire measurement.    Rollene Rotunda 12/23/2011, 4:38 PM

## 2011-12-23 NOTE — Progress Notes (Addendum)
Contacted [facility] to make them aware of need for transport to cath lab at [facility] later today.  I was told that the cath lab would contact [facility] with today's schedule, and that [facility] would then contact the floor to give [place] a pick-up time.

## 2011-12-23 NOTE — CV Procedure (Signed)
   Cardiac Catheterization Operative Report  Brent [name] [id number] 9/4/20136:19 PM Danise Edge, MD  Procedure Performed:  1. Fractional flow reserve of the RCA  Operator: Verne Carrow, MD  Indication:  Diagnostic cath per Dr. Antoine Poche secondary to chest pain and evidence of CAD on coronary CTA. Pt found to have 50% stenosis LAD and moderate stenosis in mid RCA (60-70% stenosis). I was asked to perform an FFR of the RCA to see if the stenosis was flow limiting.                                   Procedure Details: The risks, benefits, complications, treatment options, and expected outcomes were discussed with the patient before the diagnostic case. I explained the FFR procedure to the patient after entering the case. When I entered the case, there was a 5 [place] sheath present in the right radial artery. I changed this to a 6 [place] system. ACT was 178. He was given additional IV Heparin and ACT was maintained over 200.  I then engaged the RCA with a JR-4 guiding catheter. There was a thirty minute delay secondary to pharmacy while waiting for IV adenosine. The pressure wire was advanced down the RCA.  Baseline FFR was 0.99.  IV adenosine was started. FFR after adenosine infusion was 0.89. This suggested that the stenosis was not flow limiting. The guiding catheter and wire were removed. There were no immediate complications. The patient was taken to the recovery area in stable condition.   Hemodynamic Findings: See diagnostic report.   Impression: 1. Moderate disease in RCA with FFR of 0.89 suggesting no significant reduction in flow down the RCA.  2. See diagnostic report for other details.    Recommendations: Medical management of moderate CAD.        Complications:  None; patient tolerated the procedure well.

## 2011-12-23 NOTE — Progress Notes (Signed)
[facility] PROGRESS NOTE  Brent [name] HYQ:[id number] DOB: 01-01-1956 DOA: 12/21/2011 PCP: Danise Edge, MD  Assessment/Plan: Principal Problem:  *Chest pain Active Problems:  Diabetes type 2, controlled  ACID REFLUX DISEASE  HTN (hypertension)  1. Chest pain - Cardiology on board and managing. CE's x 3 negative.  Patient had Coronary CT angio which revealed possible significant stenosis in the mid RCA.   Patient scheduled for cath today 9/04.  2. DM - blood sugars relatively well controlled at this juncture would plan on continuing home regimen once patient is transitioned to home.  His last blood sugar was 100 at 1742.  Continue diabetic diet while inhouse and outpatient.  3. HTN Well controlled currently  4. Acid reflux - If cardiac work up negative would consider as contributing factor to # 1. Continue pantoprazole for now.   Code Status: Full Family Communication: Spoke with patient and family Disposition Plan: Pending results cardiac cath today.  Per cardiology   Brief narrative: Please see HPI  Consultants:  Cardiology: Kristeen Miss  Procedures:  Cardiac cath 9/04  Antibiotics:  none  HPI/Subjective: No new complaints.  Patient feels better currently.  No acute issues overnight reported.  Objective: Filed Vitals:   12/23/11 0500 12/23/11 0545 12/23/11 1500 12/23/11 1543  BP:  116/74 116/74   Pulse:  62 64 82  Temp:  98.1 F (36.7 C) 98.2 F (36.8 C)   TempSrc:  Oral Oral   Resp:  16 16   Height:      Weight: 108.999 kg (240 lb 4.8 oz)     SpO2:  97% 97%     Intake/Output Summary (Last 24 hours) at 12/23/11 1814 Last data filed at 12/23/11 1500  Gross per 24 hour  Intake 1153.95 ml  Output    675 ml  Net 478.95 ml   Filed Weights   12/22/11 0015 12/22/11 0545 12/23/11 0500  Weight: 110.5 kg (243 lb 9.7 oz) 109.9 kg (242 lb 4.6 oz) 108.999 kg (240 lb 4.8 oz)    Exam:   General:  Pt in NAD, A and O x 3  Cardiovascular:  RRR, No MRG  Respiratory: CTA BL, no wheezes  Abdomen: soft, nt, nd  Data Reviewed: Basic Metabolic Panel:  Lab 12/23/11 9528 12/22/11 0645 12/22/11 0038 12/21/11 2137  NA 138 139 139 139  K 3.8 3.4* 3.3* 3.8  CL 104 105 104 105  CO2 23 26 25 23   GLUCOSE 141* 145* 110* 101*  BUN 10 13 15 17   CREATININE 0.72 0.85 0.84 0.80  CALCIUM 9.0 8.5 8.9 9.3  MG -- -- 1.6 --  PHOS -- -- 3.6 --   Liver Function Tests:  Lab 12/22/11 0645 12/22/11 0038 12/21/11 2137  AST 21 21 28   ALT 16 16 17   ALKPHOS 41 44 49  BILITOT 0.6 0.5 0.5  PROT 5.8* 6.1 6.7  ALBUMIN 3.7 3.9 4.2   No results found for this basename: LIPASE:5,AMYLASE:5 in the last 168 hours No results found for this basename: AMMONIA:5 in the last 168 hours CBC:  Lab 12/22/11 0645 12/22/11 0038 12/21/11 2137  WBC 6.3 7.2 7.7  NEUTROABS -- 3.9 --  HGB 12.3* 12.8* 13.8  HCT 36.6* 37.3* 39.9  MCV 86.3 85.7 85.3  PLT 209 250 243   Cardiac Enzymes:  Lab 12/22/11 1145 12/22/11 0645 12/22/11 0038  CKTOTAL -- -- --  CKMB -- -- --  CKMBINDEX -- -- --  TROPONINI <0.30 <0.30 <0.30   BNP (last  3 results) No results found for this basename: PROBNP:3 in the last 8760 hours CBG:  Lab 12/23/11 1742 12/23/11 0733 12/22/11 1421 12/22/11 1229 12/22/11 0836  GLUCAP 100* 144* 134* 177* 151*    No results found for this or any previous visit (from the past 240 hour(s)).   Studies: Ct Coronary Morp W/cta Cor W/score W/ca W/cm &/or Wo/cm  12/22/2011  *RADIOLOGY REPORT*  CARDIAC CTA WITH CALCIUM SCORE 12/22/2011 13:07:00  Ordering Physician: Orlie Dakin  Reading Physician: DaltonS.Mclean  Protocol:  The patient scanned on a Philips 256 slice scanner. Prospective gating with 5% phase tolerance and 120 kV was used. 2.5 mg IV of Lopressor was administered.  Average heart rate during the scan was 66 beats per minute (HR rose after nitroglycerin administration).  After an initial AP and lateral topogram, 3 mm axial slices were performed  through the heart for calcium scoring. The patient then had an 80 ml of contrast given for coronary CTA with bolus tracking in the descending thoracic aorta.  The 3-D data set was then sent to the [employer].  Reconstructions were done using MIP,MPR and VRT modes.  Indications: Chest pain, CAD risk factors, prior normal stress echo  DETAILED FINDINGS:  Quality of Study: Fair, some degradation by artifact in the RCA territory  Left Main: No significant plaque or stenosis.  Left Anterior Descending: There was extensive mixed plaque in the proximal to mid LAD starting at the take-off of the first diagonal. Stenosis appeared mild to at most moderate (likely </= 50%).  There was a moderate D1 with calcified plaque and at most mild stenosis.  Left Circumflex: There was calcified plaque with probably mild stenosis in the distal LCx.  Right Coronary Artery: Dominant vessel.  There was extensive mixed plaque in the mid to distal RCA. There appeared to be an area of artifact obscuring interpretation of the mid RCA but cannot rule out significant stenosis.  Coronary Calcium Score: 1014 Agatston units  Aorta: The aortic root was normal in caliber.  IMPRESSION: 1. Extensive mixed plaque in the proximal to mid LAD with mild to at most moderate (likely < 50%) stenosis.  2. Extensive mixed plaque in the mid to distal RCA.  There was probably artifact obscuring the mid RCA but cannot rule out significant stenosis at that site.  3. Coronary artery calcium score of 1014 Agatston units, placing the patient in the 95 percentile for age and gender.  This suggests high risk of future cardiac events.  An absolute coronary artery calcium score > 400 suggests high risk of obstructive CAD.  4. With the extensive plaque and question of significant mid RCA lesion, would suggest cardiac catheterization.   Original Report Authenticated By: [email or web address]    Dg Chest Portable 1 View  12/21/2011  *RADIOLOGY REPORT*  Clinical Data: Left chest,  shoulder and back pain tonight. Hypoxia.  PORTABLE CHEST - 1 VIEW  Comparison: 10/29/2008 radiographs.  Findings: 2145 hours. The heart size and mediastinal contours are normal. The lungs are clear. There is no pleural effusion or pneumothorax. No acute osseous findings are identified. Telemetry leads overlie the chest.  IMPRESSION: Stable examination.  No active cardiopulmonary process.   Original Report Authenticated By: Gerrianne Scale, M.D.    Mm Digital Diagnostic Bilat  11/25/2011  *RADIOLOGY REPORT*  Clinical Data:  Occasional subareolar left breast pain  DIGITAL DIAGNOSTIC BILATERAL MAMMOGRAM  WITH CAD  Comparison:  None.  Findings:  Scattered fibroglandular densities.  Mild bilateral subareolar glandular tissue without  evidence of mass or architectural distortion, minimally greater on the left than on the right.  No mass or calcification.  Normal axillary lymph nodes bilaterally. Mammographic images were processed with CAD.  PHYSICAL EXAMINATION:  No palpable abnormalities.  IMPRESSION: Bilateral mildly asymmetric gynecomastia  BI-RADS CATEGORY 2:  Benign finding(s).  RECOMMENDATION: Manage on a clinical basis.We discussed possible causes of gynecomastia, as well as male breast cancer, and I asked the patient to report the potential development of any palpable abnormalities to his physician.  Original Report Authenticated By: [email or web address]    Scheduled Meds:   . adenosine  16 mL Intravenous Once  . aspirin  324 mg Oral Once  . aspirin  324 mg Oral Pre-Cath  . diazepam  5 mg Oral On Call  . fenofibrate  160 mg Oral Daily  . heparin      . heparin      . heparin      . lidocaine      . loratadine  10 mg Oral Daily  . metoprolol tartrate  12.5 mg Oral BID  . midazolam      . midazolam      . multivitamin with minerals  1 tablet Oral Daily  . nitroGLYCERIN  1 inch Topical Q6H  . nitroGLYCERIN      . pantoprazole  40 mg Oral Q1200  . simvastatin  5 mg Oral Daily  . sodium chloride  3 mL  Intravenous Q12H  . verapamil      . vitamin B-12  1,000 mcg Oral Daily   Continuous Infusions:   . sodium chloride 1 mL/kg/hr (12/23/11 0958)    Principal Problem:  *Chest pain Active Problems:  Diabetes type 2, controlled  ACID REFLUX DISEASE  HTN (hypertension)    Time spent: > 30 minutes    Penny Pia  [facility] Pager [phone] If 8PM-8AM, please contact night-coverage at [email or web address], password [facility] 12/23/2011, 6:14 PM  LOS: 2 days

## 2011-12-24 DIAGNOSIS — I25810 Atherosclerosis of coronary artery bypass graft(s) without angina pectoris: Secondary | ICD-10-CM

## 2011-12-24 LAB — BASIC METABOLIC PANEL
BUN: 9 mg/dL (ref 6–23)
CO2: 25 mEq/L (ref 19–32)
Calcium: 9.2 mg/dL (ref 8.4–10.5)
Chloride: 104 mEq/L (ref 96–112)
Creatinine, Ser: 0.96 mg/dL (ref 0.50–1.35)
GFR calc Af Amer: >90 mL/min (ref >90)
GFR calc non Af Amer: >90 mL/min (ref >90)
Glucose, Bld: 129 mg/dL — ABNORMAL HIGH (ref 70–99)
Potassium: 3.7 mEq/L (ref 3.5–5.1)
Sodium: 139 mEq/L (ref 135–145)

## 2011-12-24 LAB — CBC
HCT: 38 % — ABNORMAL LOW (ref 39.0–52.0)
Hemoglobin: 12.8 g/dL — ABNORMAL LOW (ref 13.0–17.0)
MCH: 29.4 pg (ref 26.0–34.0)
MCHC: 33.7 g/dL (ref 30.0–36.0)
MCV: 87.4 fL (ref 78.0–100.0)
Platelets: 199 10*3/uL (ref 150–400)
RBC: 4.35 MIL/uL (ref 4.22–5.81)
RDW: 13.4 % (ref 11.5–15.5)
WBC: 5.6 10*3/uL (ref 4.0–10.5)

## 2011-12-24 LAB — GLUCOSE, CAPILLARY: Glucose-Capillary: 119 mg/dL — ABNORMAL HIGH (ref 70–99)

## 2011-12-24 MED ORDER — METFORMIN HCL 500 MG PO TABS: 500.0000 mg | ORAL_TABLET | Freq: Two times a day (BID) | ORAL | Status: DC

## 2011-12-24 MED ORDER — ATORVASTATIN CALCIUM 20 MG PO TABS
20.0000 mg | ORAL_TABLET | Freq: Every day | ORAL | Status: DC
  Filled 2011-12-24: qty 1

## 2011-12-24 MED ORDER — ATORVASTATIN CALCIUM 20 MG PO TABS: 20.0000 mg | ORAL_TABLET | Freq: Every day | ORAL | Status: DC

## 2011-12-24 MED ORDER — ASPIRIN 81 MG PO CHEW: 81.0000 mg | CHEWABLE_TABLET | Freq: Every day | ORAL | Status: DC

## 2011-12-24 NOTE — Progress Notes (Signed)
SUBJECTIVE: No chest pain or SOB overnight. No events.   BP 100/61  Pulse 70  Temp 97.9 F (36.6 C) (Oral)  Resp 16  Ht 5\' 8"  (1.727 m)  Wt 238 lb 1.6 oz (108 kg)  BMI 36.20 kg/m2  SpO2 96%  Intake/Output Summary (Last 24 hours) at 12/24/11 0726 Last data filed at 12/24/11 0640  Gross per 24 hour  Intake 1229.1 ml  Output    475 ml  Net  754.1 ml    PHYSICAL EXAM General: Well developed, well nourished, in no acute distress. Alert and oriented x 3.  Psych:  Good affect, responds appropriately Neck: No JVD. No masses noted.  Lungs: Clear bilaterally with no wheezes or rhonci noted.  Heart: RRR with no murmurs noted. Abdomen: Bowel sounds are present. Soft, non-tender.  Extremities: No lower extremity edema. Right wrist cath site ok.   LABS: Basic Metabolic Panel:  Basename 12/23/11 0439 12/22/11 0645 12/22/11 0038  NA 138 139 --  K 3.8 3.4* --  CL 104 105 --  CO2 23 26 --  GLUCOSE 141* 145* --  BUN 10 13 --  CREATININE 0.72 0.85 --  CALCIUM 9.0 8.5 --  MG -- -- 1.6  PHOS -- -- 3.6   CBC:  Basename 12/22/11 0645 12/22/11 0038  WBC 6.3 7.2  NEUTROABS -- 3.9  HGB 12.3* 12.8*  HCT 36.6* 37.3*  MCV 86.3 85.7  PLT 209 250   Cardiac Enzymes:  Basename 12/22/11 1145 12/22/11 0645 12/22/11 0038  CKTOTAL -- -- --  CKMB -- -- --  CKMBINDEX -- -- --  TROPONINI <0.30 <0.30 <0.30   Fasting Lipid Panel:  Basename 12/21/11 2306  CHOL 144  HDL 41  LDLCALC 68  TRIG 173*  CHOLHDL 3.5  LDLDIRECT --    Current Meds:    . fenofibrate  160 mg Oral Daily  . heparin      . heparin      . heparin      . lidocaine      . loratadine  10 mg Oral Daily  . metoprolol tartrate  12.5 mg Oral BID  . midazolam      . midazolam      . multivitamin with minerals  1 tablet Oral Daily  . nitroGLYCERIN      . pantoprazole  40 mg Oral Q1200  . simvastatin  5 mg Oral Daily  . sodium chloride  3 mL Intravenous Q12H  . verapamil      . vitamin B-12  1,000 mcg Oral  Daily  . DISCONTD: adenosine  16 mL Intravenous Once  . DISCONTD: aspirin  324 mg Oral Once  . DISCONTD: diazepam  5 mg Oral On Call  . DISCONTD: nitroGLYCERIN  1 inch Topical Q6H   Cardiac Cath 12/23/11:  Coronary angiography:  Coronary dominance: Right  Left mainstem: Normal  Left anterior descending (LAD): Proximal 50%. Mild diffuse luminal irregularities. D1 moderated sized with long 50% mid stenosis. D2 and D3 small and mild irregularities.  Left circumflex (LCx): AV groove mild luminal irregularities. RI tiny with diffuse nonobstructive disease. OM2 small and normal. OM3 large with mid 30%  Right coronary artery (RCA): Large. Proximal 25%. Mid 70%. Mild luminal irregularities. PDA moderate sized with mid 30% stenosis. PL1 large with luminal irregularities. PL2 small normal.  Left ventriculography: Left ventricular systolic function is normal, LVEF is estimated at 65%, there is no significant mitral regurgitation  Final Conclusions: Diffuse nonobstructive disease.  ASSESSMENT AND PLAN:  1. Chest pain: Cardiac cath yesterday per Dr. Antoine Poche (see above) with diffuse non-obstructive CAD. I performed an FFR of a moderate stenosis in the mid RCA and this was not flow limiting. He will need aggressive risk factor reduction. Would change statin to atorvastatin 20 mg po QHS. Continue beta blocker. Continue ASA 81 mg po Qdaily. OK to d/c home today. He will need to follow up with Dr. Marca Ancona in 2-3 weeks in our [facility] office.   2. CAD: As above. Follow up on bmet and CBC before discharge.   Brent [name]  9/5/20137:26 AM

## 2011-12-24 NOTE — Progress Notes (Signed)
CARDIAC REHAB PHASE I   PRE:  Rate/Rhythm: 63 DR  BP:  Supine: 119/77  Sitting:   Standing:    SaO2:   MODE:  Ambulation: 1000 ft   POST:  Rate/Rhythem: 85 SR  BP:  Supine:   Sitting: 118/67   Standing:    SaO2:  1610-9604 Pt tolerated ambulation well without c/o of cp or OB. VS stable. Completed discharge education with pt and wife. Pt declines Outpt. CRP due to work schedule.  Brent [name]

## 2011-12-24 NOTE — Discharge Summary (Signed)
Physician Discharge Summary  Brent [name] ZOX:[id number] DOB: 1956-04-20 DOA: 12/21/2011  PCP: Danise Edge, MD  Admit date: 12/21/2011 Discharge date: 12/24/2011  Recommendations for Outpatient Follow-up:  1. Followup with [facility] cardiology on 9/24 (appointment made by cardiology) 2. Followup with primary medical doctor in one week from hospital discharge.   Discharge Diagnoses:  Principal Problem:  *Chest pain Active Problems:  Diabetes type 2, controlled  ACID REFLUX DISEASE  HTN (hypertension)  CAD (coronary artery disease) of artery bypass graft   Discharge Condition: Improved and stable.  Diet recommendation: Heart healthy and diabetic diet.  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 Although his history was atypical he has multiple CAD risk factors. He was admitted for further evaluation and management.  Hospital Course:  1. Chest pain: [place] cardiology was consulted. EKG was unremarkable and cardiac enzymes were were negative. He had a negative stress echo in 2012. Coronary CT angiogram was done and abnormal-possible mid RCA stenosis. He was transferred from [facility] long hospital to [facility] for cardiac cath. Cardiac cath on 9/4 revealed diffuse nonobstructive CAD. They performed FFR of a moderate stenosis in the mid RCA and this was not flow limiting. Patient denies any further chest pain or dyspnea. Cardiology has seen him today and has cleared him for discharge home. They recommend aggressive risk factor reduction. He will followup with cardiology as outpatient. 2. CAD: Management as above. 3. DM 2: Good outpatient control as evidenced by  hemoglobin A1c of 6.3. Hold metformin for 48 hours post cath and contrast. 4. Hypertension: Controlled. Continue metoprolol. 5. GERD: Continue PPI. 6. OSA: Continue nightly CPAP.  Procedures:  Cardiac cath on 12/23/11.   Impression:  1. Moderate disease in RCA with FFR of 0.89 suggesting no significant reduction in flow down the RCA.  2. See diagnostic report for other details.   Consultations:  [place] cardiology.  Discharge Exam:    Complaints: Patient denies chest pain or dyspnea or any other complaints.    Filed Vitals:   12/24/11 0811  BP: 119/77  Pulse: 65  Temp: 97.9 F (36.6 C)  Resp: 18   Filed Vitals:   12/23/11 2018 12/23/11 2231 12/24/11 0616 12/24/11 0811  BP: 127/76 116/74 100/61 119/77  Pulse: 72 74 70 65  Temp: 98 F (36.7 C)  97.9 F (36.6 C) 97.9 F (36.6 C)  TempSrc: Oral  Oral Oral  Resp: 24  16 18   Height: 5\' 8"  (1.727 m)     Weight: 108 kg (238 lb 1.6 oz)     SpO2: 96%  96% 96%    General exam: Moderately built and obese male patient sitting comfortably on the chair and in no obvious distress. Respiratory system: Clear to auscultation. No increased work of breathing. Cardiovascular system: S1 and S2 heard, regular rate and rhythm. No JVD, murmurs or pedal edema. Gastritis and system: Abdomen is obese, soft and nontender. Normal bowel sounds heard. Central nervous system: Alert and oriented x3. No focal neurological deficits. Extremities: Symmetric 5 x 5 power.   Discharge Instructions  Discharge Orders    Future Appointments: Provider: Department: Dept Phone: Center:   01/12/2012  9:30 AM Ok Anis, NP [facility] [phone] [facility]   05/13/2012 9:15 AM Bradd Canary, MD [facility] [employer] [phone] None     Future Orders Please Complete By Expires   Diet - low sodium heart healthy      Diet Carb Modified      Increase activity slowly      Discharge instructions      Comments:   Return to work on  12/29/2011 as per cardiologist instructions.   Call MD for:  severe uncontrolled pain      Discharge instructions      Comments:   Continue nightly CPAP.     Medication List  As of 12/24/2011 12:15 PM   STOP taking these medications         simvastatin 5 MG tablet      TOPROL XL 25 MG 24 hr tablet         TAKE these medications         aspirin 81 MG tablet   Take 1 tablet (81 mg total) by mouth daily.      atorvastatin 20 MG tablet   Commonly known as: LIPITOR   Take 1 tablet (20 mg total) by mouth daily at 6 PM.      CALCIUM CITRATE PO   Take 1,500 mg by mouth daily.      cetirizine 10 MG tablet   Commonly known as: ZYRTEC   Take 10 mg by mouth daily.      fenofibrate 160 MG tablet   Take 160 mg by mouth daily.      fish oil-omega-3 fatty acids 1000 MG capsule   Take 2,400 mg by mouth 2 (two) times daily.      freestyle lancets   1 each by Other route as directed. Use as instructed      FREESTYLE LITE test strip   Generic drug: glucose blood   1 each by Other route as directed. Use as instructed      metFORMIN 500 MG tablet   Commonly known as: GLUCOPHAGE   Take 1 tablet (500 mg total) by mouth 2 (two) times daily with a meal. Currently on hold. May restart on 12/26/2011.   Start taking on: 12/26/2011      metoprolol tartrate 25 MG tablet   Commonly known as: LOPRESSOR   Take 12.5 mg by mouth 2 (two) times daily.      multivitamin tablet   Take 1 tablet by mouth daily.      omeprazole 20 MG capsule   Commonly known as: PRILOSEC   Take 20 mg by mouth daily.      Vitamin B-12 1000 MCG Subl   Place 1 each under the tongue daily.           Follow-up Information    Follow up with Nicolasa Ducking, NP on 01/12/2012. (9:30 AM, Dr. Alford Highland Nurse Practitioner)    Contact information:   [address] [place] [address] [phone]       Follow up with Danise Edge, MD. Schedule an appointment as soon as possible for a  visit in 1 week.   Contact information:   [address] [place] [address] [phone]           The results of significant diagnostics from this hospitalization (including imaging, microbiology, ancillary and laboratory) are listed below for reference.    Significant Diagnostic Studies: Ct Coronary Morp W/cta Cor W/score W/ca W/cm &/  or Wo/cm  12/22/2011  *RADIOLOGY REPORT*  CARDIAC CTA WITH CALCIUM SCORE 12/22/2011 13:07:00  Ordering Physician: Orlie Dakin  Reading Physician: DaltonS.Mclean  Protocol:  The patient scanned on a Philips 256 slice scanner. Prospective gating with 5% phase tolerance and 120 kV was used. 2.5 mg IV of Lopressor was administered.  Average heart rate during the scan was 66 beats per minute (HR rose after nitroglycerin administration).  After an initial AP and lateral topogram, 3 mm axial slices were performed through the heart for calcium scoring. The patient then had an 80 ml of contrast given for coronary CTA with bolus tracking in the descending thoracic aorta.  The 3-D data set was then sent to the [employer].  Reconstructions were done using MIP,MPR and VRT modes.  Indications: Chest pain, CAD risk factors, prior normal stress echo  DETAILED FINDINGS:  Quality of Study: Fair, some degradation by artifact in the RCA territory  Left Main: No significant plaque or stenosis.  Left Anterior Descending: There was extensive mixed plaque in the proximal to mid LAD starting at the take-off of the first diagonal. Stenosis appeared mild to at most moderate (likely </= 50%).  There was a moderate D1 with calcified plaque and at most mild stenosis.  Left Circumflex: There was calcified plaque with probably mild stenosis in the distal LCx.  Right Coronary Artery: Dominant vessel.  There was extensive mixed plaque in the mid to distal RCA. There appeared to be an area of artifact obscuring interpretation of the mid RCA but cannot rule out significant stenosis.   Coronary Calcium Score: 1014 Agatston units  Aorta: The aortic root was normal in caliber.  IMPRESSION: 1. Extensive mixed plaque in the proximal to mid LAD with mild to at most moderate (likely < 50%) stenosis.  2. Extensive mixed plaque in the mid to distal RCA.  There was probably artifact obscuring the mid RCA but cannot rule out significant stenosis at that site.  3. Coronary artery calcium score of 1014 Agatston units, placing the patient in the 95 percentile for age and gender.  This suggests high risk of future cardiac events.  An absolute coronary artery calcium score > 400 suggests high risk of obstructive CAD.  4. With the extensive plaque and question of significant mid RCA lesion, would suggest cardiac catheterization.   Original Report Authenticated By: [email or web address]    Dg Chest Portable 1 View  12/21/2011  *RADIOLOGY REPORT*  Clinical Data: Left chest, shoulder and back pain tonight. Hypoxia.  PORTABLE CHEST - 1 VIEW  Comparison: 10/29/2008 radiographs.  Findings: 2145 hours. The heart size and mediastinal contours are normal. The lungs are clear. There is no pleural effusion or pneumothorax. No acute osseous findings are identified. Telemetry leads overlie the chest.  IMPRESSION: Stable examination.  No active cardiopulmonary process.   Original Report Authenticated By: Gerrianne Scale, M.D.    Mm Digital Diagnostic Bilat  11/25/2011  *RADIOLOGY REPORT*  Clinical Data:  Occasional subareolar left breast pain  DIGITAL DIAGNOSTIC BILATERAL MAMMOGRAM  WITH CAD  Comparison:  None.  Findings:  Scattered fibroglandular densities.  Mild bilateral subareolar glandular tissue without evidence of mass or architectural distortion, minimally greater on the left than on the right.  No mass or calcification.  Normal axillary lymph nodes bilaterally. Mammographic images were processed with CAD.  PHYSICAL EXAMINATION:  No palpable abnormalities.  IMPRESSION: Bilateral mildly asymmetric gynecomastia  BI-RADS  CATEGORY 2:  Benign finding(s).  RECOMMENDATION: Manage on a clinical basis.We discussed possible causes  of gynecomastia, as well as male breast cancer, and I asked the patient to report the potential development of any palpable abnormalities to his physician.  Original Report Authenticated By: [email or web address]    Microbiology: No results found for this or any previous visit (from the past 240 hour(s)).   Labs: Basic Metabolic Panel:  Lab 12/24/11 2130 12/23/11 0439 12/22/11 0645 12/22/11 0038 12/21/11 2137  NA 139 138 139 139 139  K 3.7 3.8 3.4* 3.3* 3.8  CL 104 104 105 104 105  CO2 25 23 26 25 23   GLUCOSE 129* 141* 145* 110* 101*  BUN 9 10 13 15 17   CREATININE 0.96 0.72 0.85 0.84 0.80  CALCIUM 9.2 9.0 8.5 8.9 9.3  MG -- -- -- 1.6 --  PHOS -- -- -- 3.6 --   Liver Function Tests:  Lab 12/22/11 0645 12/22/11 0038 12/21/11 2137  AST 21 21 28   ALT 16 16 17   ALKPHOS 41 44 49  BILITOT 0.6 0.5 0.5  PROT 5.8* 6.1 6.7  ALBUMIN 3.7 3.9 4.2   No results found for this basename: LIPASE:5,AMYLASE:5 in the last 168 hours No results found for this basename: AMMONIA:5 in the last 168 hours CBC:  Lab 12/24/11 0705 12/22/11 0645 12/22/11 0038 12/21/11 2137  WBC 5.6 6.3 7.2 7.7  NEUTROABS -- -- 3.9 --  HGB 12.8* 12.3* 12.8* 13.8  HCT 38.0* 36.6* 37.3* 39.9  MCV 87.4 86.3 85.7 85.3  PLT 199 209 250 243   Cardiac Enzymes:  Lab 12/22/11 1145 12/22/11 0645 12/22/11 0038  CKTOTAL -- -- --  CKMB -- -- --  CKMBINDEX -- -- --  TROPONINI <0.30 <0.30 <0.30   BNP: BNP (last 3 results) No results found for this basename: PROBNP:3 in the last 8760 hours CBG:  Lab 12/24/11 0809 12/23/11 2147 12/23/11 1742 12/23/11 0733 12/22/11 1421  GLUCAP 119* 175* 100* 144* 134*   Other lab data  1. Lipid panel: Cholesterol 144, triglycerides 173, HDL 41, LDL 68, VLDL 35. 2. Hemoglobin A1c: 6.3. 3. TSH: 3.050    Time coordinating discharge: Less than 30  minutes  Signed:  Karyna Bessler  [facility] 12/24/2011, 12:15 PM

## 2012-01-12 ENCOUNTER — Ambulatory Visit (INDEPENDENT_AMBULATORY_CARE_PROVIDER_SITE_OTHER): Admitting: Nurse Practitioner

## 2012-01-12 ENCOUNTER — Other Ambulatory Visit: Payer: Self-pay

## 2012-01-12 ENCOUNTER — Encounter: Payer: Self-pay | Admitting: Nurse Practitioner

## 2012-01-12 VITALS — BP 134/78 | HR 99 | Ht 68.0 in | Wt 244.0 lb

## 2012-01-12 DIAGNOSIS — E785 Hyperlipidemia, unspecified: Secondary | ICD-10-CM

## 2012-01-12 DIAGNOSIS — I10 Essential (primary) hypertension: Secondary | ICD-10-CM

## 2012-01-12 DIAGNOSIS — I2510 Atherosclerotic heart disease of native coronary artery without angina pectoris: Secondary | ICD-10-CM | POA: Insufficient documentation

## 2012-01-12 MED ORDER — SIMVASTATIN 5 MG PO TABS: 5.0000 mg | ORAL_TABLET | Freq: Every day | ORAL | Status: DC

## 2012-01-12 NOTE — Progress Notes (Signed)
Patient Name: Brent [name] Date of Encounter: 01/12/2012  Primary Care Provider:  Danise Edge, MD Primary Cardiologist:  Golden Circle, MD  Patient Profile  56 year old male with recent history of chest pain status post nonobstructive catheterization who presents for followup.  Problem List   Past Medical History  Diagnosis Date  . DM (diabetes mellitus), type 2, uncontrolled   . Acid reflux disease   . Sleep apnea     a. CPAP  . Hx of colonic polyps   . Diverticulosis of colon   . Hypertension 07/14/2010  . Morbid obesity 03/27/2010    a. 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  . 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 HYPERSOMNIA, ASSOCIATED WITH SLEEP APNEA 07/26/2008  . DIVERTICULOSIS, COLON 10/29/2008  . Diarrhea 06/13/2010  . COLONIC POLYPS, HX OF 10/29/2008  . Benign neoplasm of colon 07/05/2007  . ACID REFLUX DISEASE 07/05/2007  . Knee pain, left 10/10/2010  . Tear of meniscus of left knee 2012  . HTN (hypertension) 07/14/2010  . Breast pain, left 11/17/2011  . Preventative health care 11/17/2011  . Chest pain     a. Reportedly negative dobut echo performed prior to gastric bypass in 03/2011;  b. 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 12/2011 Cath: LM nl, LAD 50p, D1 59m, LCX min irregs, OM3 30, RCA 25p, 76m (FFR 0.99->0.89), PDA 30, EF 65%, Med Rx.   Past Surgical History  Procedure Date  . Ankle surgery 1994  . Tonsillectomy age 66  . Colonoscopy polyps   . Knee arthroscopy 11/06/10    Left, torn meniscus (repaired)  . Colonoscopy   . Gastric bypass   . Hernia repair     Allergies  Allergies  Allergen Reactions  . Bee Venom Anaphylaxis  . Lipitor (Atorvastatin)   . Morphine     hyperactive    HPI  56 year old male with the above problem list.  He was admitted in early September with chest pain which was somewhat constant.  He ruled out for MI.  He underwent CT angiography which suggested moderate right coronary artery stenosis.  This led to a diagnostic catheterization  which showed a 70% mid RCA stenosis and otherwise nonobstructive disease.  Fractional flow reserve was performed within the right coronary artery and was normal at 0.99 to and 0.89 following adenosine.  He was medically managed.  As part of his medical therapy, his statin was changed from simvastatin 5 mg to Lipitor 20 mg.  Unfortunately, this has caused lower extremity myalgias and he has cut this in half the myalgias have persisted to some extent.  He would like to switch back to simvastatin.  He has had no chest pain or dyspnea on exertion.  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  Home Medications  Prior to Admission medications   Medication Sig Start Date End Date Taking? Authorizing Provider  aspirin 81 MG tablet Take 1 tablet (81 mg total) by mouth daily. 06/08/11  Yes Bradd Canary, MD  atorvastatin (LIPITOR) 20 MG tablet Take 10 mg by mouth daily at 6 PM. 12/24/11 12/23/12 Yes Elease Etienne, MD  CALCIUM CITRATE PO Take 1,500 mg by mouth daily.   Yes Historical Provider, MD  cetirizine (ZYRTEC) 10 MG tablet Take 10 mg by mouth daily. 11/17/11 11/16/12 Yes Bradd Canary, MD  Cyanocobalamin (VITAMIN B-12) 1000 MCG SUBL Place 1 each under the tongue  daily.   Yes Historical Provider, MD  fenofibrate 160 MG tablet Take 160 mg by mouth daily.     Yes Historical Provider, MD  fish oil-omega-3 fatty acids 1000 MG capsule Take 2,400 mg by mouth 2 (two) times daily.     Yes Historical Provider, MD  glucose blood (FREESTYLE LITE) test strip 1 each by Other route as directed. Use as instructed    Yes Historical Provider, MD  Lancets (FREESTYLE) lancets 1 each by Other route as directed. Use as instructed    Yes Historical Provider, MD  metFORMIN (GLUCOPHAGE) 500 MG tablet Take 1 tablet (500 mg total) by mouth 2 (two) times daily with a meal. Currently on hold. May restart on 12/26/2011. 12/26/11  Yes Elease Etienne, MD    metoprolol tartrate (LOPRESSOR) 25 MG tablet Take 12.5 mg by mouth 2 (two) times daily. 11/17/11 11/16/12 Yes Bradd Canary, MD  Multiple Vitamin (MULTIVITAMIN) tablet Take 1 tablet by mouth daily.     Yes Historical Provider, MD  omeprazole (PRILOSEC) 20 MG capsule Take 20 mg by mouth daily. 11/17/11  Yes Bradd Canary, MD  simvastatin (ZOCOR) 5 MG tablet Take 1 tablet (5 mg total) by mouth at bedtime. 01/12/12   Ok Anis, NP    Review of Systems  As above, doing well.  He denies pnd, orthopnea, n, v, dizziness, syncope, edema, weight gain, or early satiety. All other systems reviewed and are otherwise negative except as noted above.  Physical Exam  Blood pressure 134/78, pulse 99, height 5\' 8"  (1.727 m), weight 244 lb (110.678 kg), SpO2 95.00%.  General: Pleasant, NAD Psych: Normal affect. Neuro: Alert and oriented X 3. Moves all extremities spontaneously. HEENT: Normal  Neck: Supple without bruits or JVD. Lungs:  Resp regular and unlabored, CTA. Heart: RRR no s3, s4, or murmurs. Abdomen: Soft, non-tender, non-distended, BS + x 4.  Extremities: No clubbing, cyanosis or edema. DP/PT/Radials 2+ and equal bilaterally.  Right wrist cath site w/o bleeding, bruit, hematoma.  Accessory Clinical Findings  None  Assessment & Plan  1.  Nonobstructive coronary artery disease: Patient is doing well status post catheterization.  He remains on medical therapy including aspirin, beta blocker, and statin therapy.  As noted above, he has had myalgias with Lipitor and we will switch him back to simvastatin 5 mg q.h.s.  His LDL during recent hospitalization was 60.  2.  Hypertension: Mildly elevated today though he notes it usually runs lower.  Continue current medications and he will continue to follow at home.  3.  Hyperlipidemia: LDL of 60 while hospitalized.  Resume simvastatin and discontinue Lipitor in the setting of myalgias.  4.  Diabetes mellitus: He remains on metformin  therapy and is followed by primary care.  5.  Disposition: Followup with Dr. Jearld Pies in 6 months.    Nicolasa Ducking, NP 01/12/2012, 10:13 AM

## 2012-01-12 NOTE — Patient Instructions (Addendum)
**Note De-Identified Macee Venables Obfuscation** Your physician has recommended you make the following change in your medication: Stop taking Lipitor and start taking Simvastatin 5 mg at bedtime  Your physician wants you to follow-up in: 6 months. You will receive a reminder letter in the mail two months in advance. If you don't receive a letter, please call our office to schedule the follow-up appointment.

## 2012-02-02 ENCOUNTER — Ambulatory Visit (INDEPENDENT_AMBULATORY_CARE_PROVIDER_SITE_OTHER): Admitting: Family Medicine

## 2012-02-02 ENCOUNTER — Encounter: Payer: Self-pay | Admitting: Family Medicine

## 2012-02-02 VITALS — BP 116/81 | HR 76 | Temp 98.2°F | Ht 68.0 in | Wt 243.1 lb

## 2012-02-02 DIAGNOSIS — E119 Type 2 diabetes mellitus without complications: Secondary | ICD-10-CM

## 2012-02-02 DIAGNOSIS — I2510 Atherosclerotic heart disease of native coronary artery without angina pectoris: Secondary | ICD-10-CM

## 2012-02-02 DIAGNOSIS — Z23 Encounter for immunization: Secondary | ICD-10-CM

## 2012-02-02 DIAGNOSIS — I10 Essential (primary) hypertension: Secondary | ICD-10-CM

## 2012-02-02 DIAGNOSIS — K219 Gastro-esophageal reflux disease without esophagitis: Secondary | ICD-10-CM

## 2012-02-02 NOTE — Patient Instructions (Addendum)
MegaRed caps by [employer] is a good Krill oil caps   Cholesterol Cholesterol is a white, waxy, fat-like protein needed by your body in small amounts. The liver makes all the cholesterol you need. It is carried from the liver by the blood through the blood vessels. Deposits (plaque) may build up on blood vessel walls. This makes the arteries narrower and stiffer. Plaque increases the risk for heart attack and stroke. You cannot feel your cholesterol level even if it is very high. The only way to know is by a blood test to check your lipid (fats) levels. Once you know your cholesterol levels, you should keep a record of the test results. Work with your caregiver to to keep your levels in the desired range. WHAT THE RESULTS MEAN:  Total cholesterol is a rough measure of all the cholesterol in your blood.  LDL is the so-called bad cholesterol. This is the type that deposits cholesterol in the walls of the arteries. You want this level to be low.  HDL is the good cholesterol because it cleans the arteries and carries the LDL away. You want this level to be high.  Triglycerides are fat that the body can either burn for energy or store. High levels are closely linked to heart disease. DESIRED LEVELS:  Total cholesterol below 200.  LDL below 100 for people at risk, below 70 for very high risk.  HDL above 50 is good, above 60 is best.  Triglycerides below 150. HOW TO LOWER YOUR CHOLESTEROL:  Diet.  Choose fish or white meat chicken and [place], roasted or baked. Limit fatty cuts of red meat, fried foods, and processed meats, such as sausage and lunch meat.  Eat lots of fresh fruits and vegetables. Choose whole grains, beans, pasta, potatoes and cereals.  Use only small amounts of olive, corn or canola oils. Avoid butter, mayonnaise, shortening or palm kernel oils. Avoid foods with trans-fats.  Use skim/nonfat milk and low-fat/nonfat yogurt and cheeses. Avoid whole milk, cream, ice cream, egg  yolks and cheeses. Healthy desserts include angel food cake, ginger snaps, animal crackers, hard candy, popsicles, and low-fat/nonfat frozen yogurt. Avoid pastries, cakes, pies and cookies.  Exercise.  A regular program helps decrease LDL and raises HDL.  Helps with weight control.  Do things that increase your activity level like gardening, walking, or taking the stairs.  Medication.  May be prescribed by your caregiver to help lowering cholesterol and the risk for heart disease.  You may need medicine even if your levels are normal if you have several risk factors. HOME CARE INSTRUCTIONS   Follow your diet and exercise programs as suggested by your caregiver.  Take medications as directed.  Have blood work done when your caregiver feels it is necessary. MAKE SURE YOU:   Understand these instructions.  Will watch your condition.  Will get help right away if you are not doing well or get worse. Document Released: 12/30/2000 Document Revised: 06/29/2011 Document Reviewed: 06/22/2007 [facility] [address] Patient Information 2013 [place], [place].

## 2012-02-04 NOTE — Assessment & Plan Note (Signed)
Weight is up slightly from his lowest weight of 238. Encouraged to continue weight loss efforts.

## 2012-02-04 NOTE — Assessment & Plan Note (Signed)
Doing well with glucose regulation since his gastric bypass surgery.

## 2012-02-04 NOTE — Assessment & Plan Note (Signed)
Had eaten very badly prior to his chest pain, due to his annual pig pickin and believes reflux played a major role in his episode of chest pain. He has returned to a more modest lowfat diet and his symptoms have not return. Continue Omeprazole.

## 2012-02-04 NOTE — Progress Notes (Signed)
Patient ID: Brent [name], male   DOB: [birth year], 56 y.o.   MRN: [id number] Brent [name] [id number] Dec 05, 1955 02/04/2012      Progress Note-Follow Up  Subjective  Chief Complaint  Chief Complaint  Patient presents with  . Follow-up    from hospital    HPI  Patient is a 55 yo caucasian male in today for hospital follow up. He was admitted to the hospital in September with chest pain. He reports that his chest pain improved some with NTG but improved better with magnesium and calcium infusions. He has not any further episodes of chest pain since discharge from the hospital. In retrospect he had had his annual PPD and his prior to this and he can excessively and he believes his symptoms were related to heartburn. He did do a cardiac cath which did show the largest blockage to be in the LAD of 50%. Was decided to manage him medically. He has appointment with a nutritionist [facility] to readdress his weight management and diet. He reports his blood sugars have been well-controlled. He denies any palpitations or shortness of breath. He denies any acute illness, congestion, headache, GI or GU complaints at this time.  Past Medical History  Diagnosis Date  . DM (diabetes mellitus), type 2, uncontrolled   . Acid reflux disease   . Sleep apnea     a. CPAP  . Hx of colonic polyps   . Diverticulosis of colon   . Hypertension 07/14/2010  . Morbid obesity 03/27/2010    a. 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  . 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 HYPERSOMNIA, ASSOCIATED WITH SLEEP APNEA 07/26/2008  . DIVERTICULOSIS, COLON 10/29/2008  . Diarrhea 06/13/2010  . COLONIC POLYPS, HX OF 10/29/2008  . Benign neoplasm of colon 07/05/2007  . ACID REFLUX DISEASE 07/05/2007  . Knee pain, left 10/10/2010  . Tear of meniscus of left knee 2012  . HTN (hypertension) 07/14/2010  . Breast pain, left 11/17/2011  . Preventative health care 11/17/2011  . Chest pain     a. Reportedly negative dobut echo performed  prior to gastric bypass in 03/2011;  b. 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 12/2011 Cath: LM nl, LAD 50p, D1 53m, LCX min irregs, OM3 30, RCA 25p, 78m (FFR 0.99->0.89), PDA 30, EF 65%, Med Rx.    Past Surgical History  Procedure Date  . Ankle surgery 1994  . Tonsillectomy age 14  . Colonoscopy polyps   . Knee arthroscopy 11/06/10    Left, torn meniscus (repaired)  . Colonoscopy   . Gastric bypass   . Hernia repair     Family History  Problem Relation Age of Onset  . Diabetes Mother   . Hypertension Mother   . Stroke Mother   . Hyperlipidemia Mother   . Hypertension Father   . Colon polyps Father   . Heart attack Father 15  . Stroke Father   . Heart attack Brother   . Diabetes Brother   . Heart disease Brother   . Heart attack Brother     Multiple  . Diabetes Brother   . Other Brother     heart problems  . Heart disease Brother   . Diabetes Sister   . Obesity Brother   . Diabetes Brother   . Hypertension Maternal Grandmother   . ADD / ADHD Daughter   . Stomach cancer Neg Hx     History   Social History  . Marital Status: Married  Spouse Name: N/A    Number of Children: N/A  . Years of Education: N/A   Occupational History  . Not on file.   Social History Main Topics  . Smoking status: Former Smoker -- 1.5 packs/day for 20 years    Types: Cigarettes    Quit date: 04/21/1991  . Smokeless tobacco: Never Used  . Alcohol Use: 0.0 oz/week    0 drink(s) per week      occasionaly- social  . Drug Use: No  . Sexually Active: Yes   Other Topics Concern  . Not on file   Social History Narrative   Lives with wife in [place].  Does not routinely exercise.    Current Outpatient Prescriptions on File Prior to Visit  Medication Sig Dispense Refill  . aspirin 81 MG tablet Take 1 tablet (81 mg total) by mouth daily.      [name] CALCIUM CITRATE PO Take 1,500 mg by mouth daily.      . cetirizine (ZYRTEC) 10 MG tablet Take 10 mg by mouth daily.      . Cyanocobalamin  (VITAMIN B-12) 1000 MCG SUBL Place 1 each under the tongue daily.      . fenofibrate 160 MG tablet Take 160 mg by mouth daily.        . fish oil-omega-3 fatty acids 1000 MG capsule Take 2,400 mg by mouth 2 (two) times daily.        [name] glucose blood (FREESTYLE LITE) test strip 1 each by Other route as directed. Use as instructed       . Lancets (FREESTYLE) lancets 1 each by Other route as directed. Use as instructed       . metFORMIN (GLUCOPHAGE) 500 MG tablet Take 1 tablet (500 mg total) by mouth 2 (two) times daily with a meal. Currently on hold. May restart on 12/26/2011.      . metoprolol tartrate (LOPRESSOR) 25 MG tablet Take 12.5 mg by mouth 2 (two) times daily.      . Multiple Vitamin (MULTIVITAMIN) tablet Take 1 tablet by mouth daily.        [name] omeprazole (PRILOSEC) 20 MG capsule Take 20 mg by mouth daily.      . simvastatin (ZOCOR) 5 MG tablet Take 1 tablet (5 mg total) by mouth at bedtime.  90 tablet  1  . DISCONTD: ramipril (ALTACE) 10 MG tablet Take 1 tablet (10 mg total) by mouth daily. 2 tabs po daily  180 tablet  1    Allergies  Allergen Reactions  . Bee Venom Anaphylaxis  . Lipitor (Atorvastatin)     Myalgias, memory changes  . Morphine     hyperactive    Review of Systems  Review of Systems  Constitutional: Negative for fever and malaise/fatigue.  HENT: Negative for congestion.   Eyes: Negative for discharge.  Respiratory: Negative for shortness of breath.   Cardiovascular: Positive for chest pain. Negative for palpitations and leg swelling.  Gastrointestinal: Negative for nausea, abdominal pain and diarrhea.  Genitourinary: Negative for dysuria.  Musculoskeletal: Negative for falls.  Skin: Negative for rash.  Neurological: Negative for loss of consciousness and headaches.  Endo/Heme/Allergies: Negative for polydipsia.  Psychiatric/Behavioral: Negative for depression and suicidal ideas. The patient is not nervous/anxious and does not have insomnia.      Objective  BP 116/81  Pulse 76  Temp 98.2 F (36.8 C) (Temporal)  Ht 5\' 8"  (1.727 m)  Wt 243 lb 1.9 oz (110.279 kg)  BMI 36.97 kg/m2  SpO2  99%  Physical Exam  Physical Exam  Constitutional: He is oriented to person, place, and time and well-developed, well-nourished, and in no distress. No distress.  HENT:  Head: Normocephalic and atraumatic.  Eyes: Conjunctivae normal are normal.  Neck: Neck supple. No thyromegaly present.  Cardiovascular: Normal rate, regular rhythm and normal heart sounds.   No murmur heard. Pulmonary/Chest: Effort normal and breath sounds normal. No respiratory distress.  Abdominal: He exhibits no distension and no mass. There is no tenderness.  Musculoskeletal: He exhibits no edema.  Neurological: He is alert and oriented to person, place, and time.  Skin: Skin is warm.  Psychiatric: Memory, affect and judgment normal.    Lab Results  Component Value Date   TSH 3.050 12/22/2011   Lab Results  Component Value Date   WBC 5.6 12/24/2011   HGB 12.8* 12/24/2011   HCT 38.0* 12/24/2011   MCV 87.4 12/24/2011   PLT 199 12/24/2011   Lab Results  Component Value Date   CREATININE 0.96 12/24/2011   BUN 9 12/24/2011   NA 139 12/24/2011   K 3.7 12/24/2011   CL 104 12/24/2011   CO2 25 12/24/2011   Lab Results  Component Value Date   ALT 16 12/22/2011   AST 21 12/22/2011   ALKPHOS 41 12/22/2011   BILITOT 0.6 12/22/2011   Lab Results  Component Value Date   CHOL 144 12/21/2011   Lab Results  Component Value Date   HDL 41 12/21/2011   Lab Results  Component Value Date   LDLCALC 68 12/21/2011   Lab Results  Component Value Date   TRIG 173* 12/21/2011   Lab Results  Component Value Date   CHOLHDL 3.5 12/21/2011     Assessment & Plan  HTN (hypertension) Adequately controlled, no changes to meds at this time.  CAD (coronary artery disease) Was admitted to the hospital last month and underwent cardiac cath for evaluation of chest pain. His cath did not reveal any  significant occlusions although LAD was 50 % stenosed. Medical management for now. Patient very compliant with medications and continues to try and maintain lifestyle changes. NTG, Mag, Ca all seemed to help his pain the day of the hospitalization.  ACID REFLUX DISEASE Had eaten very badly prior to his chest pain, due to his annual pig pickin and believes reflux played a major role in his episode of chest pain. He has returned to a more modest lowfat diet and his symptoms have not return. Continue Omeprazole.  Morbid obesity Weight is up slightly from his lowest weight of 238. Encouraged to continue weight loss efforts.  Diabetes type 2, controlled Doing well with glucose regulation since his gastric bypass surgery.

## 2012-02-04 NOTE — Assessment & Plan Note (Signed)
Adequately controlled, no changes to meds at this time.

## 2012-02-04 NOTE — Assessment & Plan Note (Addendum)
Was admitted to the hospital last month and underwent cardiac cath for evaluation of chest pain. His cath did not reveal any significant occlusions although LAD was 50 % stenosed. Medical management for now. Patient very compliant with medications and continues to try and maintain lifestyle changes. NTG, Mag, Ca all seemed to help his pain the day of the hospitalization.

## 2012-03-01 ENCOUNTER — Encounter (HOSPITAL_BASED_OUTPATIENT_CLINIC_OR_DEPARTMENT_OTHER): Payer: Self-pay | Admitting: *Deleted

## 2012-03-01 ENCOUNTER — Emergency Department (HOSPITAL_BASED_OUTPATIENT_CLINIC_OR_DEPARTMENT_OTHER)
Admission: EM | Admit: 2012-03-01 | Discharge: 2012-03-01 | Disposition: A | Discharge status: Home / Self Care | Attending: Emergency Medicine | Admitting: Emergency Medicine

## 2012-03-01 ENCOUNTER — Telehealth: Payer: Self-pay | Admitting: Family Medicine

## 2012-03-01 DIAGNOSIS — K219 Gastro-esophageal reflux disease without esophagitis: Secondary | ICD-10-CM | POA: Insufficient documentation

## 2012-03-01 DIAGNOSIS — G4730 Sleep apnea, unspecified: Secondary | ICD-10-CM | POA: Insufficient documentation

## 2012-03-01 DIAGNOSIS — Y9389 Activity, other specified: Secondary | ICD-10-CM | POA: Insufficient documentation

## 2012-03-01 DIAGNOSIS — Y929 Unspecified place or not applicable: Secondary | ICD-10-CM | POA: Insufficient documentation

## 2012-03-01 DIAGNOSIS — Z860100 Personal history of colon polyps, unspecified: Secondary | ICD-10-CM | POA: Insufficient documentation

## 2012-03-01 DIAGNOSIS — F17200 Nicotine dependence, unspecified, uncomplicated: Secondary | ICD-10-CM | POA: Insufficient documentation

## 2012-03-01 DIAGNOSIS — IMO0001 Reserved for inherently not codable concepts without codable children: Secondary | ICD-10-CM | POA: Insufficient documentation

## 2012-03-01 DIAGNOSIS — Z7982 Long term (current) use of aspirin: Secondary | ICD-10-CM | POA: Insufficient documentation

## 2012-03-01 DIAGNOSIS — W268XXA Contact with other sharp object(s), not elsewhere classified, initial encounter: Secondary | ICD-10-CM | POA: Insufficient documentation

## 2012-03-01 DIAGNOSIS — Z87828 Personal history of other (healed) physical injury and trauma: Secondary | ICD-10-CM | POA: Insufficient documentation

## 2012-03-01 DIAGNOSIS — Z8719 Personal history of other diseases of the digestive system: Secondary | ICD-10-CM | POA: Insufficient documentation

## 2012-03-01 DIAGNOSIS — Z23 Encounter for immunization: Secondary | ICD-10-CM | POA: Insufficient documentation

## 2012-03-01 DIAGNOSIS — Z79899 Other long term (current) drug therapy: Secondary | ICD-10-CM | POA: Insufficient documentation

## 2012-03-01 DIAGNOSIS — I10 Essential (primary) hypertension: Secondary | ICD-10-CM | POA: Insufficient documentation

## 2012-03-01 DIAGNOSIS — Z85038 Personal history of other malignant neoplasm of large intestine: Secondary | ICD-10-CM | POA: Insufficient documentation

## 2012-03-01 DIAGNOSIS — S61209A Unspecified open wound of unspecified finger without damage to nail, initial encounter: Secondary | ICD-10-CM | POA: Insufficient documentation

## 2012-03-01 MED ORDER — LIDOCAINE-EPINEPHRINE 2 %-1:100000 IJ SOLN
20.0000 mL | Freq: Once | INTRAMUSCULAR | Status: AC
  Administered 2012-03-01: 20 mL

## 2012-03-01 MED ORDER — TETANUS-DIPHTH-ACELL PERTUSSIS 5-2.5-18.5 LF-MCG/0.5 IM SUSP
0.5000 mL | Freq: Once | INTRAMUSCULAR | Status: AC
  Administered 2012-03-01: 0.5 mL via INTRAMUSCULAR
  Filled 2012-03-01: qty 0.5

## 2012-03-01 MED ORDER — HYDROCODONE-ACETAMINOPHEN 5-325 MG PO TABS: 1.0000 | ORAL_TABLET | Freq: Four times a day (QID) | ORAL | Status: DC | PRN

## 2012-03-01 MED ORDER — LIDOCAINE-EPINEPHRINE 2 %-1:100000 IJ SOLN
INTRAMUSCULAR | Status: AC
  Administered 2012-03-01: 20 mL
  Filled 2012-03-01: qty 1

## 2012-03-01 NOTE — ED Provider Notes (Signed)
History     CSN: [id number]  Arrival date & time 03/01/12  4098   First MD Initiated Contact with Patient 03/01/12 [phone]      Chief Complaint  Patient presents with  . Laceration    (Consider location/radiation/quality/duration/timing/severity/associated sxs/prior treatment) HPI Pt complains of L 3rd finger lac which was obtained yesterday evening while working with sheet metal. Pt cleaned wound and applied steri strips but wound did not stay closed, so pt presented to ED for further evaluation. Denies any worsening pain or fevers/chills. States he is not sure when he last received a tetanus vaccine. Denies any other complaints.   Past Medical History  Diagnosis Date  . DM (diabetes mellitus), type 2, uncontrolled   . Acid reflux disease   . Sleep apnea     a. CPAP  . Hx of colonic polyps   . Diverticulosis of colon   . Hypertension 07/14/2010  . Morbid obesity 03/27/2010    a. 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  . 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 HYPERSOMNIA, ASSOCIATED WITH SLEEP APNEA 07/26/2008  . DIVERTICULOSIS, COLON 10/29/2008  . Diarrhea 06/13/2010  . COLONIC POLYPS, HX OF 10/29/2008  . Benign neoplasm of colon 07/05/2007  . ACID REFLUX DISEASE 07/05/2007  . Knee pain, left 10/10/2010  . Tear of meniscus of left knee 2012  . HTN (hypertension) 07/14/2010  . Breast pain, left 11/17/2011  . Preventative health care 11/17/2011  . Chest pain     a. Reportedly negative dobut echo performed prior to gastric bypass in 03/2011;  b. 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 12/2011 Cath: LM nl, LAD 50p, D1 50m, LCX min irregs, OM3 30, RCA 25p, 46m (FFR 0.99->0.89), PDA 30, EF 65%, Med Rx.    Past Surgical History  Procedure Date  . Ankle surgery 1994  . Tonsillectomy age 77  . Colonoscopy polyps   . Knee arthroscopy 11/06/10    Left, torn meniscus (repaired)  . Colonoscopy   . Gastric bypass   . Hernia repair     Family History  Problem Relation Age of Onset  . Diabetes Mother     . Hypertension Mother   . Stroke Mother   . Hyperlipidemia Mother   . Hypertension Father   . Colon polyps Father   . Heart attack Father 58  . Stroke Father   . Heart attack Brother   . Diabetes Brother   . Heart disease Brother   . Heart attack Brother     Multiple  . Diabetes Brother   . Other Brother     heart problems  . Heart disease Brother   . Diabetes Sister   . Obesity Brother   . Diabetes Brother   . Hypertension Maternal Grandmother   . ADD / ADHD Daughter   . Stomach cancer Neg Hx     History  Substance Use Topics  . Smoking status: Former Smoker -- 1.5 packs/day for 20 years    Types: Cigarettes    Quit date: 04/21/1991  . Smokeless tobacco: Never Used  . Alcohol Use: 0.0 oz/week    0 drink(s) per week     Comment:  occasionaly- social      Review of Systems  All other systems reviewed and are negative.    Allergies  Bee venom; Lipitor; and Morphine  Home Medications   Current Outpatient Rx  Name  Route  Sig  Dispense  Refill  . ASPIRIN 81 MG PO TABS   Oral  Take 1 tablet (81 mg total) by mouth daily.         [name] CALCIUM CITRATE PO   Oral   Take 1,500 mg by mouth daily.         [name] CETIRIZINE HCL 10 MG PO TABS   Oral   Take 10 mg by mouth daily.         [name] VITAMIN B-12 1000 MCG SL SUBL   Sublingual   Place 1 each under the tongue daily.         . FENOFIBRATE 160 MG PO TABS   Oral   Take 160 mg by mouth daily.           . OMEGA-3 FATTY ACIDS 1000 MG PO CAPS   Oral   Take 2,400 mg by mouth 2 (two) times daily.           [name] GLUCOSE BLOOD VI STRP   Other   1 each by Other route as directed. Use as instructed          . FREESTYLE LANCETS MISC   Other   1 each by Other route as directed. Use as instructed          . METFORMIN HCL 500 MG PO TABS   Oral   Take 1 tablet (500 mg total) by mouth 2 (two) times daily with a meal. Currently on hold. May restart on 12/26/2011.         [name] METOPROLOL TARTRATE 25 MG PO  TABS   Oral   Take 12.5 mg by mouth 2 (two) times daily.         [name] ONE-DAILY MULTI VITAMINS PO TABS   Oral   Take 1 tablet by mouth daily.           [name] OMEPRAZOLE 20 MG PO CPDR   Oral   Take 20 mg by mouth daily.         [name] SIMVASTATIN 5 MG PO TABS   Oral   Take 1 tablet (5 mg total) by mouth at bedtime.   90 tablet   1     To replace Lipitor     BP 113/71  Pulse 72  Temp 98.2 F (36.8 C) (Oral)  Resp 20  SpO2 98%  Physical Exam  Constitutional: He is oriented to person, place, and time. He appears well-developed and well-nourished. No distress.  HENT:  Head: Normocephalic and atraumatic.  Eyes: Pupils are equal, round, and reactive to light.  Neck: Normal range of motion. No tracheal deviation present.  Cardiovascular: Normal rate and regular rhythm.   No murmur heard. Pulmonary/Chest: Effort normal. He has no wheezes.  Abdominal: Soft. Bowel sounds are normal. He exhibits no distension. There is no tenderness.  Musculoskeletal: Normal range of motion. He exhibits no edema.  Neurological: He is alert and oriented to person, place, and time. No cranial nerve deficit.  Skin: Skin is warm and dry.       1.5cm vertical laceration along dorsal surface of L 3rd digit at PIP  Psychiatric: He has a normal mood and affect. His behavior is normal.    ED Course  Procedures (including critical care time)  Labs Reviewed - No data to display No results found.  LACERATION REPAIR Performed by: Vedika Dumlao R Authorized by: Keigo Whalley R Consent: Verbal consent obtained. Risks and benefits: risks, benefits and alternatives were discussed Consent given by: patient Patient identity confirmed: provided demographic data Prepped and Draped in normal sterile fashion  Wound explored  Laceration Location: L 3rd digit  Laceration Length: 1.5cm  No Foreign Bodies seen or palpated  Anesthesia: local infiltration  Local anesthetic: lidocaine 2% with  epinephrine  Anesthetic total: 3 ml  Irrigation method: syringe Amount of cleaning: standard  Skin closure: 6-0 ethilon   Number of sutures: 3  Technique: simple interrupted  Patient tolerance: Patient tolerated the procedure well with no immediate complications.  1. Laceration of third finger of left hand       MDM  Laceration sutured. No active bleeding. Pt appropriate for discharge.         Elfredia Nevins, MD 03/01/12 1026

## 2012-03-01 NOTE — Telephone Encounter (Signed)
Please advise 

## 2012-03-01 NOTE — Telephone Encounter (Signed)
Printed and faxed to pharmacy.  

## 2012-03-01 NOTE — ED Notes (Signed)
Pt amb to room 9 with quick steady gait in nad. Pt reports laceration to right third digit last night with sheet metal. Pt applied steri strips, but cont oozing blood this am so came to ed for eval.

## 2012-03-01 NOTE — ED Provider Notes (Signed)
I saw and evaluated the patient, reviewed the resident's note and I agree with the findings and plan.   .Face to face Exam:  General:  Awake HEENT:  Atraumatic Resp:  Normal effort Abd:  Nondistended Neuro:No focal weakness Lymph: No adenopathy   Nelia Shi, MD 03/01/12 1045

## 2012-03-01 NOTE — Telephone Encounter (Signed)
OK to call in Norco 5/325, 1 tab po q 6 hours prn pain, disp #30, 1 rf

## 2012-04-07 ENCOUNTER — Encounter: Payer: Self-pay | Admitting: Pulmonary Disease

## 2012-04-07 ENCOUNTER — Ambulatory Visit (INDEPENDENT_AMBULATORY_CARE_PROVIDER_SITE_OTHER): Admitting: Pulmonary Disease

## 2012-04-07 VITALS — BP 138/86 | HR 81 | Temp 98.2°F | Ht 68.0 in | Wt 249.6 lb

## 2012-04-07 DIAGNOSIS — Z9989 Dependence on other enabling machines and devices: Secondary | ICD-10-CM | POA: Insufficient documentation

## 2012-04-07 DIAGNOSIS — G4733 Obstructive sleep apnea (adult) (pediatric): Secondary | ICD-10-CM | POA: Insufficient documentation

## 2012-04-07 NOTE — Progress Notes (Signed)
  Subjective:    Patient ID: [name], male    DOB: Apr 29, 1955, 56 y.o.   MRN: [id number]  HPI 56 y.o obese, diabetic with severe obstructive sleep apnea .  4/10>> severe obstructive sleep apnea with predominant hypopneas causing sleep fragmentation & hypoxia, AHI 33/h, lowest desatn 84%.  Central apneas emerged on low levels of CPAP ? complex sleep apnea, did not tolerate cycling of BiPAP,final level tried was 9/5 with 5 central apneas & 1 mixed apnea in 5 mins of sleep  reviewed data on IPAP 5-15 & EPAP 5-10>> good compliance, no leak, 95th percentile pressure 15  Pressure changed to IPAP 10-18, EPAP 5-10    04/07/2012 Underwent gastric bypass - lost from 331 to 249 wears bpap everynight x 4-8 hrs a night. denies any problems w/ machine. sleeping okay. still feeling tired during the day. he is working and going to scholl full time. pt would like to change to nasal pillows Download on VPAP auto IPAP 18 EPAP 5 with PS 5 shows AHI 2/h, acceptable leak & good usage He is only on metformin now No snoring, wakes up rested  Review of Systems neg for any significant sore throat, dysphagia, itching, sneezing, nasal congestion or excess/ purulent secretions, fever, chills, sweats, unintended wt loss, pleuritic or exertional cp, hempoptysis, orthopnea pnd or change in chronic leg swelling. Also denies presyncope, palpitations, heartburn, abdominal pain, nausea, vomiting, diarrhea or change in bowel or urinary habits, dysuria,hematuria, rash, arthralgias, visual complaints, headache, numbness weakness or ataxia.     Objective:   Physical Exam Gen. Pleasant, obese, in no distress ENT - no lesions, no post nasal drip Neck: No JVD, no thyromegaly, no carotid bruits Lungs: no use of accessory muscles, no dullness to percussion, decreased without rales or rhonchi  Cardiovascular: Rhythm regular, heart sounds  normal, no murmurs or gallops, no peripheral edema Musculoskeletal: No deformities, no  cyanosis or clubbing , no tremors         Assessment & Plan:

## 2012-04-07 NOTE — Patient Instructions (Signed)
Change to CPAP pressure 11 cm & chk download in 1 month If download looks OK, you can proceed with nasal pillows & we will repeat download on that Congratulations on your wt loss

## 2012-04-07 NOTE — Assessment & Plan Note (Signed)
Change to CPAP pressure 11 cm & chk download in 1 month If download looks OK, you can proceed with nasal pillows & we will repeat download on that Congratulations on your wt loss  Weight loss encouraged, compliance with goal of at least 4-6 hrs every night is the expectation. Advised against medications with sedative side effects Cautioned against driving when sleepy - understanding that sleepiness will vary on a day to day basis

## 2012-05-09 ENCOUNTER — Ambulatory Visit: Admitting: Family Medicine

## 2012-05-09 ENCOUNTER — Ambulatory Visit (INDEPENDENT_AMBULATORY_CARE_PROVIDER_SITE_OTHER): Admitting: Family Medicine

## 2012-05-09 ENCOUNTER — Encounter: Payer: Self-pay | Admitting: Family Medicine

## 2012-05-09 VITALS — BP 112/80 | HR 74 | Temp 98.1°F | Ht 68.0 in | Wt 245.0 lb

## 2012-05-09 DIAGNOSIS — I10 Essential (primary) hypertension: Secondary | ICD-10-CM

## 2012-05-09 DIAGNOSIS — G4733 Obstructive sleep apnea (adult) (pediatric): Secondary | ICD-10-CM

## 2012-05-09 DIAGNOSIS — I2510 Atherosclerotic heart disease of native coronary artery without angina pectoris: Secondary | ICD-10-CM

## 2012-05-09 DIAGNOSIS — E119 Type 2 diabetes mellitus without complications: Secondary | ICD-10-CM

## 2012-05-09 DIAGNOSIS — E785 Hyperlipidemia, unspecified: Secondary | ICD-10-CM

## 2012-05-09 LAB — MAGNESIUM: Magnesium: 1.7 mg/dL (ref 1.5–2.5)

## 2012-05-09 LAB — HEPATIC FUNCTION PANEL
ALT: 19 U/L (ref 0–53)
AST: 24 U/L (ref 0–37)
Albumin: 4.4 g/dL (ref 3.5–5.2)
Alkaline Phosphatase: 45 U/L (ref 39–117)
Bilirubin, Direct: 0.2 mg/dL (ref 0.0–0.3)
Indirect Bilirubin: 0.6 mg/dL (ref 0.0–0.9)
Total Bilirubin: 0.8 mg/dL (ref 0.3–1.2)
Total Protein: 6.6 g/dL (ref 6.0–8.3)

## 2012-05-09 LAB — RENAL FUNCTION PANEL
Albumin: 4.4 g/dL (ref 3.5–5.2)
BUN: 16 mg/dL (ref 6–23)
CO2: 27 mEq/L (ref 19–32)
Calcium: 9.6 mg/dL (ref 8.4–10.5)
Chloride: 106 mEq/L (ref 96–112)
Creat: 1 mg/dL (ref 0.50–1.35)
Glucose, Bld: 89 mg/dL (ref 70–99)
Phosphorus: 3.6 mg/dL (ref 2.3–4.6)
Potassium: 4.6 mEq/L (ref 3.5–5.3)
Sodium: 141 mEq/L (ref 135–145)

## 2012-05-09 LAB — LIPID PANEL
Cholesterol: 124 mg/dL (ref 0–200)
HDL: 40 mg/dL (ref >39)
LDL Cholesterol: 56 mg/dL (ref 0–99)
Total CHOL/HDL Ratio: 3.1 Ratio
Triglycerides: 142 mg/dL (ref <150)
VLDL: 28 mg/dL (ref 0–40)

## 2012-05-09 LAB — HEMOGLOBIN A1C
Hgb A1c MFr Bld: 6.3 % — ABNORMAL HIGH (ref <5.7)
Mean Plasma Glucose: 134 mg/dL — ABNORMAL HIGH (ref <117)

## 2012-05-09 LAB — CBC
HCT: 42.3 % (ref 39.0–52.0)
Hemoglobin: 14.5 g/dL (ref 13.0–17.0)
MCH: 29.4 pg (ref 26.0–34.0)
MCHC: 34.3 g/dL (ref 30.0–36.0)
MCV: 85.8 fL (ref 78.0–100.0)
Platelets: 265 10*3/uL (ref 150–400)
RBC: 4.93 MIL/uL (ref 4.22–5.81)
RDW: 13.5 % (ref 11.5–15.5)
WBC: 5.9 10*3/uL (ref 4.0–10.5)

## 2012-05-09 LAB — TSH: TSH: 1.103 u[IU]/mL (ref 0.350–4.500)

## 2012-05-13 ENCOUNTER — Ambulatory Visit: Admitting: Family Medicine

## 2012-05-15 NOTE — Assessment & Plan Note (Signed)
His weight loss has slowed, he continues to try and avoid simple carbs. 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

## 2012-05-15 NOTE — Assessment & Plan Note (Signed)
Stable, no changes  

## 2012-05-15 NOTE — Assessment & Plan Note (Signed)
Will benefit from a repeat sleep study if weight loss continues.

## 2012-05-15 NOTE — Assessment & Plan Note (Signed)
Well controlled on current meds, no changes 

## 2012-05-15 NOTE — Assessment & Plan Note (Signed)
Sugars continue to improve.  No changes to meds. minimize simple carbs.

## 2012-05-15 NOTE — Progress Notes (Signed)
Patient ID: Brent [name], male   DOB: 1955-06-18, 57 y.o.   MRN: [id number] Brent [name] [id number] 06/02/1955 05/15/2012      Progress Note-Follow Up  Subjective  Chief Complaint  Chief Complaint  Patient presents with  . Follow-up    6 month     HPI  Patient is a 57 year old Caucasian male in today for followup. No recent illness. He reports he feels well. No chest pain, palpitations, shortness or breath, GI or GU complaints. He was not good at following his diet over the holidays and acknowledges he's had trouble losing weight as a result.  Past Medical History  Diagnosis Date  . DM (diabetes mellitus), type 2, uncontrolled   . Acid reflux disease   . Sleep apnea     a. CPAP  . Hx of colonic polyps   . Diverticulosis of colon   . Hypertension 07/14/2010  . Morbid obesity 03/27/2010    a. 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  . 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 HYPERSOMNIA, ASSOCIATED WITH SLEEP APNEA 07/26/2008  . DIVERTICULOSIS, COLON 10/29/2008  . Diarrhea 06/13/2010  . COLONIC POLYPS, HX OF 10/29/2008  . Benign neoplasm of colon 07/05/2007  . ACID REFLUX DISEASE 07/05/2007  . Knee pain, left 10/10/2010  . Tear of meniscus of left knee 2012  . HTN (hypertension) 07/14/2010  . Breast pain, left 11/17/2011  . Preventative health care 11/17/2011  . Chest pain     a. Reportedly negative dobut echo performed prior to gastric bypass in 03/2011;  b. 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 12/2011 Cath: LM nl, LAD 50p, D1 71m, LCX min irregs, OM3 30, RCA 25p, 5m (FFR 0.99->0.89), PDA 30, EF 65%, Med Rx.    Past Surgical History  Procedure Date  . Ankle surgery 1994  . Tonsillectomy age 57  . Colonoscopy polyps   . Knee arthroscopy 11/06/10    Left, torn meniscus (repaired)  . Colonoscopy   . Gastric bypass   . Hernia repair     Family History  Problem Relation Age of Onset  . Diabetes Mother   . Hypertension Mother   . Stroke Mother   . Hyperlipidemia Mother   .  Hypertension Father   . Colon polyps Father   . Heart attack Father 1  . Stroke Father   . Heart attack Brother   . Diabetes Brother   . Heart disease Brother   . Heart attack Brother     Multiple  . Diabetes Brother   . Other Brother     heart problems  . Heart disease Brother   . Diabetes Sister   . Obesity Brother   . Diabetes Brother   . Hypertension Maternal Grandmother   . ADD / ADHD Daughter   . Stomach cancer Neg Hx     History   Social History  . Marital Status: Married    Spouse Name: N/A    Number of Children: N/A  . Years of Education: N/A   Occupational History  . Not on file.   Social History Main Topics  . Smoking status: Former Smoker -- 1.5 packs/day for 20 years    Types: Cigarettes    Quit date: 04/21/1991  . Smokeless tobacco: Never Used  . Alcohol Use: 0.0 oz/week    0 drink(s) per week     Comment:  occasionaly- social  . Drug Use: No  . Sexually Active: Yes   Other Topics Concern  . Not  on file   Social History Narrative   Lives with wife in [place].  Does not routinely exercise.    Current Outpatient Prescriptions on File Prior to Visit  Medication Sig Dispense Refill  . aspirin 81 MG tablet Take 1 tablet (81 mg total) by mouth daily.      [name] CALCIUM CITRATE PO Take 1,500 mg by mouth daily.      . cetirizine (ZYRTEC) 10 MG tablet Take 10 mg by mouth daily.      . Cyanocobalamin (VITAMIN B-12) 1000 MCG SUBL Place 1 each under the tongue daily.      . fenofibrate 160 MG tablet Take 160 mg by mouth daily.        . fish oil-omega-3 fatty acids 1000 MG capsule Take 2,400 mg by mouth 2 (two) times daily.        [name] glucose blood (FREESTYLE LITE) test strip 1 each by Other route as directed. Use as instructed       . HYDROcodone-acetaminophen (NORCO/VICODIN) 5-325 MG per tablet Take 1 tablet by mouth every 6 (six) hours as needed.  30 tablet  1  . Lancets (FREESTYLE) lancets 1 each by Other route as directed. Use as instructed       .  metFORMIN (GLUCOPHAGE) 500 MG tablet Take 250 mg by mouth 2 (two) times daily with a meal. Currently on hold. May restart on 12/26/2011.      . metoprolol tartrate (LOPRESSOR) 25 MG tablet Take 12.5 mg by mouth 2 (two) times daily.      . Multiple Vitamin (MULTIVITAMIN) tablet Take 1 tablet by mouth daily.        [name] omeprazole (PRILOSEC) 20 MG capsule Take 20 mg by mouth daily.      . simvastatin (ZOCOR) 5 MG tablet Take 1 tablet (5 mg total) by mouth at bedtime.  90 tablet  1  . [DISCONTINUED] ramipril (ALTACE) 10 MG tablet Take 1 tablet (10 mg total) by mouth daily. 2 tabs po daily  180 tablet  1    Allergies  Allergen Reactions  . Bee Venom Anaphylaxis  . Lipitor (Atorvastatin)     Myalgias, memory changes  . Morphine     hyperactive    Review of Systems  Review of Systems  Constitutional: Negative for fever and malaise/fatigue.  HENT: Negative for congestion.   Eyes: Negative for discharge.  Respiratory: Negative for shortness of breath.   Cardiovascular: Negative for chest pain, palpitations and leg swelling.  Gastrointestinal: Negative for nausea, abdominal pain and diarrhea.  Genitourinary: Negative for dysuria.  Musculoskeletal: Negative for falls.  Skin: Negative for rash.  Neurological: Negative for loss of consciousness and headaches.  Endo/Heme/Allergies: Negative for polydipsia.  Psychiatric/Behavioral: Negative for depression and suicidal ideas. The patient is not nervous/anxious and does not have insomnia.     Objective  BP 112/80  Pulse 74  Temp 98.1 F (36.7 C) (Oral)  Ht 5\' 8"  (1.727 m)  Wt 245 lb (111.131 kg)  BMI 37.25 kg/m2  SpO2 97%  Physical Exam  Physical Exam  Constitutional: He is oriented to person, place, and time and well-developed, well-nourished, and in no distress. No distress.  HENT:  Head: Normocephalic and atraumatic.  Eyes: Conjunctivae normal are normal.  Neck: Neck supple. No thyromegaly present.  Cardiovascular: Normal rate,  regular rhythm and normal heart sounds.   No murmur heard. Pulmonary/Chest: Effort normal and breath sounds normal. No respiratory distress.  Abdominal: He exhibits no distension and no mass. There  is no tenderness.  Musculoskeletal: He exhibits no edema.  Neurological: He is alert and oriented to person, place, and time.  Skin: Skin is warm.  Psychiatric: Memory, affect and judgment normal.    Lab Results  Component Value Date   TSH 1.103 05/09/2012   Lab Results  Component Value Date   WBC 5.9 05/09/2012   HGB 14.5 05/09/2012   HCT 42.3 05/09/2012   MCV 85.8 05/09/2012   PLT 265 05/09/2012   Lab Results  Component Value Date   CREATININE 1.00 05/09/2012   BUN 16 05/09/2012   NA 141 05/09/2012   K 4.6 05/09/2012   CL 106 05/09/2012   CO2 27 05/09/2012   Lab Results  Component Value Date   ALT 19 05/09/2012   AST 24 05/09/2012   ALKPHOS 45 05/09/2012   BILITOT 0.8 05/09/2012   Lab Results  Component Value Date   CHOL 124 05/09/2012   Lab Results  Component Value Date   HDL 40 05/09/2012   Lab Results  Component Value Date   LDLCALC 56 05/09/2012   Lab Results  Component Value Date   TRIG 142 05/09/2012   Lab Results  Component Value Date   CHOLHDL 3.1 05/09/2012     Assessment & Plan  HTN (hypertension) Well controlled on current meds, no changes.  Morbid obesity His weight loss has slowed, he continues to try and avoid simple carbs. Encouraged DASH diet and he continues to follow with his  Gastric surgeon  OSA (obstructive sleep apnea) Will benefit from a repeat sleep study if weight loss continues.   Diabetes type 2, controlled Sugars continue to improve.  No changes to meds. minimize simple carbs.  CAD (coronary artery disease) Stable, no changes.

## 2012-05-23 ENCOUNTER — Other Ambulatory Visit: Payer: Self-pay | Admitting: Nurse Practitioner

## 2012-06-27 ENCOUNTER — Telehealth: Payer: Self-pay | Admitting: Pulmonary Disease

## 2012-06-27 DIAGNOSIS — G4733 Obstructive sleep apnea (adult) (pediatric): Secondary | ICD-10-CM

## 2012-06-27 NOTE — Telephone Encounter (Signed)
Download 3/14 -CPAP 11 cm very effective Acceptable usage No changes

## 2012-06-29 ENCOUNTER — Telehealth: Payer: Self-pay | Admitting: Pulmonary Disease

## 2012-06-29 NOTE — Telephone Encounter (Signed)
Spoke to pt and made him aware that Dr. Vassie Loll didn't have any appointments until 07/29/12. I advised him that I could send the message to Dr. Vassie Loll and see if it was okay with him to fit the patient in, but he declined and stated he would be fine with waiting until 07/29/12. He has been scheduled for 07/29/12 @ 4:15.  Nothing further was needed.

## 2012-06-29 NOTE — Telephone Encounter (Signed)
My Chart Message Copied Below:  Appointment Request From: [name] With Provider: [name]., MD [[facility]]  Preferred Date Range: From 06/28/2012 To 07/18/2012  Preferred Times: Monday Afternoon, Tuesday Afternoon, Wednesday Afternoon, Thursday Afternoon, Friday Afternoon Reason for visit:  Office Visit Comments: Follow up on readings as request by email sent to me from My Chart.  Antionette Fairy

## 2012-06-30 NOTE — Telephone Encounter (Signed)
I spoke with patient about results and he verbalized understanding and had no questions 

## 2012-07-01 ENCOUNTER — Telehealth: Payer: Self-pay | Admitting: Pulmonary Disease

## 2012-07-01 NOTE — Telephone Encounter (Signed)
Spoke with pt He states that he is aware that he did not qualify for the new mask yet He has one already that is working fine and will call us if anything is needed Will close encounter

## 2012-07-29 ENCOUNTER — Encounter: Payer: Self-pay | Admitting: Pulmonary Disease

## 2012-07-29 ENCOUNTER — Ambulatory Visit (INDEPENDENT_AMBULATORY_CARE_PROVIDER_SITE_OTHER): Admitting: Pulmonary Disease

## 2012-07-29 VITALS — BP 122/74 | HR 104 | Temp 97.8°F | Ht 68.0 in | Wt 254.0 lb

## 2012-07-29 DIAGNOSIS — G4733 Obstructive sleep apnea (adult) (pediatric): Secondary | ICD-10-CM

## 2012-07-29 NOTE — Patient Instructions (Signed)
Your CPAP is set at 11 cm We will renew CPAP supplies

## 2012-07-29 NOTE — Progress Notes (Signed)
  Subjective:    Patient ID: Brent [name], male    DOB: 1956-04-11, 56 y.o.   MRN: [id number]  HPI 57 y.o obese, diabetic with severe obstructive sleep apnea .  07/2008>> severe obstructive sleep apnea with predominant hypopneas causing sleep fragmentation & hypoxia, AHI 33/h, lowest desatn 84%.  Central apneas emerged on low levels of CPAP ? complex sleep apnea, did not tolerate cycling of BiPAP,final level tried was 9/5 with 5 central apneas & 1 mixed apnea in 5 mins of sleep  reviewed data on IPAP 5-15 & EPAP 5-10>> good compliance, no leak, 95th percentile pressure 15  Pressure changed to IPAP 10-18, EPAP 5-10   04/07/2012  Underwent gastric bypass - lost from 331 to 249  wears bpap everynight x 4-8 hrs a night. denies any problems w/ machine. sleeping okay. still feeling tired during the day. he is working and going to scholl full time. pt would like to change to nasal pillows  Download on VPAP auto IPAP 18 EPAP 5 with PS 5 shows AHI 2/h, acceptable leak & good usage  He is only on metformin now  No snoring, wakes up rested   07/29/2012 Download 3/14 -CPAP 11 cm very effective , Minimal leak Acceptable usage  Pt states he wears his CPAP everynight. varies in hrs. pt states he feels rested during the day. Feels like the pressure is sitll fine. He can't get a new mask until august.  No dryness, mask ok   Review of Systems neg for any significant sore throat, dysphagia, itching, sneezing, nasal congestion or excess/ purulent secretions, fever, chills, sweats, unintended wt loss, pleuritic or exertional cp, hempoptysis, orthopnea pnd or change in chronic leg swelling. Also denies presyncope, palpitations, heartburn, abdominal pain, nausea, vomiting, diarrhea or change in bowel or urinary habits, dysuria,hematuria, rash, arthralgias, visual complaints, headache, numbness weakness or ataxia.     Objective:   Physical Exam  Gen. Pleasant, obese, in no distress ENT - no lesions, no  post nasal drip Neck: No JVD, no thyromegaly, no carotid bruits Lungs: no use of accessory muscles, no dullness to percussion, decreased without rales or rhonchi  Cardiovascular: Rhythm regular, heart sounds  normal, no murmurs or gallops, no peripheral edema Musculoskeletal: No deformities, no cyanosis or clubbing , no tremors        Assessment & Plan:

## 2012-07-29 NOTE — Assessment & Plan Note (Signed)
On CPAP 11 cm - new settings after wt loss I hope he will be able to sustain his weight loss.  In fact if he drops less than 220 pounds, we can consider repeating a sleep study to see if sleep apnea is resolved  Weight loss encouraged, compliance with goal of at least 4-6 hrs every night is the expectation. Advised against medications with sedative side effects Cautioned against driving when sleepy - understanding that sleepiness will vary on a day to day basis

## 2012-08-24 ENCOUNTER — Emergency Department (HOSPITAL_BASED_OUTPATIENT_CLINIC_OR_DEPARTMENT_OTHER)

## 2012-08-24 ENCOUNTER — Emergency Department (HOSPITAL_BASED_OUTPATIENT_CLINIC_OR_DEPARTMENT_OTHER)
Admission: EM | Admit: 2012-08-24 | Discharge: 2012-08-24 | Disposition: A | Discharge status: Home / Self Care | Attending: Emergency Medicine | Admitting: Emergency Medicine

## 2012-08-24 ENCOUNTER — Encounter (HOSPITAL_BASED_OUTPATIENT_CLINIC_OR_DEPARTMENT_OTHER): Payer: Self-pay | Admitting: *Deleted

## 2012-08-24 DIAGNOSIS — Z87828 Personal history of other (healed) physical injury and trauma: Secondary | ICD-10-CM | POA: Insufficient documentation

## 2012-08-24 DIAGNOSIS — Y9289 Other specified places as the place of occurrence of the external cause: Secondary | ICD-10-CM | POA: Insufficient documentation

## 2012-08-24 DIAGNOSIS — Z7982 Long term (current) use of aspirin: Secondary | ICD-10-CM | POA: Insufficient documentation

## 2012-08-24 DIAGNOSIS — K219 Gastro-esophageal reflux disease without esophagitis: Secondary | ICD-10-CM | POA: Insufficient documentation

## 2012-08-24 DIAGNOSIS — Z79899 Other long term (current) drug therapy: Secondary | ICD-10-CM | POA: Insufficient documentation

## 2012-08-24 DIAGNOSIS — Y9301 Activity, walking, marching and hiking: Secondary | ICD-10-CM | POA: Insufficient documentation

## 2012-08-24 DIAGNOSIS — Z860100 Personal history of colon polyps, unspecified: Secondary | ICD-10-CM | POA: Insufficient documentation

## 2012-08-24 DIAGNOSIS — Z8719 Personal history of other diseases of the digestive system: Secondary | ICD-10-CM | POA: Insufficient documentation

## 2012-08-24 DIAGNOSIS — Z87448 Personal history of other diseases of urinary system: Secondary | ICD-10-CM | POA: Insufficient documentation

## 2012-08-24 DIAGNOSIS — Z8739 Personal history of other diseases of the musculoskeletal system and connective tissue: Secondary | ICD-10-CM | POA: Insufficient documentation

## 2012-08-24 DIAGNOSIS — X500XXA Overexertion from strenuous movement or load, initial encounter: Secondary | ICD-10-CM | POA: Insufficient documentation

## 2012-08-24 DIAGNOSIS — Z87891 Personal history of nicotine dependence: Secondary | ICD-10-CM | POA: Insufficient documentation

## 2012-08-24 DIAGNOSIS — S93401A Sprain of unspecified ligament of right ankle, initial encounter: Secondary | ICD-10-CM

## 2012-08-24 DIAGNOSIS — S93409A Sprain of unspecified ligament of unspecified ankle, initial encounter: Secondary | ICD-10-CM | POA: Insufficient documentation

## 2012-08-24 DIAGNOSIS — Z9889 Other specified postprocedural states: Secondary | ICD-10-CM | POA: Insufficient documentation

## 2012-08-24 DIAGNOSIS — I10 Essential (primary) hypertension: Secondary | ICD-10-CM | POA: Insufficient documentation

## 2012-08-24 DIAGNOSIS — IMO0001 Reserved for inherently not codable concepts without codable children: Secondary | ICD-10-CM | POA: Insufficient documentation

## 2012-08-24 DIAGNOSIS — Z8679 Personal history of other diseases of the circulatory system: Secondary | ICD-10-CM | POA: Insufficient documentation

## 2012-08-24 DIAGNOSIS — G4730 Sleep apnea, unspecified: Secondary | ICD-10-CM | POA: Insufficient documentation

## 2012-08-24 DIAGNOSIS — Z9884 Bariatric surgery status: Secondary | ICD-10-CM | POA: Insufficient documentation

## 2012-08-24 DIAGNOSIS — Z9981 Dependence on supplemental oxygen: Secondary | ICD-10-CM | POA: Insufficient documentation

## 2012-08-24 MED ORDER — HYDROCODONE-ACETAMINOPHEN 5-325 MG PO TABS: 2.0000 | ORAL_TABLET | ORAL | Status: DC | PRN

## 2012-08-24 NOTE — ED Notes (Signed)
Patient transported to [facility] with cane

## 2012-08-24 NOTE — ED Notes (Signed)
Ace wrap applied by Ignatius Specking, EMT prior to pt d/c

## 2012-08-24 NOTE — ED Provider Notes (Signed)
History     CSN: [id number]  Arrival date & time 08/24/12  2128   First MD Initiated Contact with Patient 08/24/12 2212      Chief Complaint  Patient presents with  . Ankle Pain    (Consider location/radiation/quality/duration/timing/severity/associated sxs/prior treatment) Patient is a 57 y.o. male presenting with ankle pain. The history is provided by the patient.  Ankle Pain Location:  Ankle Injury: yes   Mechanism of injury comment:  Inversion injury while walking on sidewalk Ankle location:  R ankle Pain details:    Quality:  Sharp   Radiates to:  Does not radiate   Severity:  Moderate   Onset quality:  Sudden   Duration:  12 hours   Timing:  Constant   Progression:  Worsening Chronicity:  New Dislocation: no   Prior injury to area:  No Relieved by:  Nothing Worsened by:  Activity and bearing weight   Past Medical History  Diagnosis Date  . DM (diabetes mellitus), type 2, uncontrolled   . Acid reflux disease   . Sleep apnea     a. CPAP  . Hx of colonic polyps   . Diverticulosis of colon   . Hypertension 07/14/2010  . Morbid obesity 03/27/2010    a. 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  . 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 HYPERSOMNIA, ASSOCIATED WITH SLEEP APNEA 07/26/2008  . DIVERTICULOSIS, COLON 10/29/2008  . Diarrhea 06/13/2010  . COLONIC POLYPS, HX OF 10/29/2008  . Benign neoplasm of colon 07/05/2007  . ACID REFLUX DISEASE 07/05/2007  . Knee pain, left 10/10/2010  . Tear of meniscus of left knee 2012  . HTN (hypertension) 07/14/2010  . Breast pain, left 11/17/2011  . Preventative health care 11/17/2011  . Chest pain     a. Reportedly negative dobut echo performed prior to gastric bypass in 03/2011;  b. 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 12/2011 Cath: LM nl, LAD 50p, D1 25m, LCX min irregs, OM3 30, RCA 25p, 85m (FFR 0.99->0.89), PDA 30, EF 65%, Med Rx.    Past Surgical History  Procedure Laterality Date  . Ankle surgery  1994  . Tonsillectomy  age 6  .  Colonoscopy polyps    . Knee arthroscopy  11/06/10    Left, torn meniscus (repaired)  . Colonoscopy    . Gastric bypass    . Hernia repair      Family History  Problem Relation Age of Onset  . Diabetes Mother   . Hypertension Mother   . Stroke Mother   . Hyperlipidemia Mother   . Hypertension Father   . Colon polyps Father   . Heart attack Father 91  . Stroke Father   . Heart attack Brother   . Diabetes Brother   . Heart disease Brother   . Heart attack Brother     Multiple  . Diabetes Brother   . Other Brother     heart problems  . Heart disease Brother   . Diabetes Sister   . Obesity Brother   . Diabetes Brother   . Hypertension Maternal Grandmother   . ADD / ADHD Daughter   . Stomach cancer Neg Hx     History  Substance Use Topics  . Smoking status: Former Smoker -- 1.50 packs/day for 20 years    Types: Cigarettes    Quit date: 04/21/1991  . Smokeless tobacco: Never Used  . Alcohol Use: No     Comment:  occasionaly- social  Review of Systems  All other systems reviewed and are negative.    Allergies  Bee venom; Lipitor; and Morphine  Home Medications   Current Outpatient Rx  Name  Route  Sig  Dispense  Refill  . aspirin 81 MG tablet   Oral   Take 1 tablet (81 mg total) by mouth daily.         [name] CALCIUM CITRATE PO   Oral   Take 1,500 mg by mouth daily.         . cetirizine (ZYRTEC) 10 MG tablet   Oral   Take 10 mg by mouth daily.         . Cholecalciferol (VITAMIN D-3) 5000 UNITS TABS   Oral   Take 1 tablet by mouth daily.         . Cyanocobalamin (VITAMIN B-12) 1000 MCG SUBL   Sublingual   Place 1 each under the tongue daily.         . fenofibrate 160 MG tablet   Oral   Take 160 mg by mouth daily.           . fish oil-omega-3 fatty acids 1000 MG capsule   Oral   Take 2,400 mg by mouth 2 (two) times daily.           [name] glucose blood (FREESTYLE LITE) test strip   Other   1 each by Other route as directed. Use as  instructed          . HYDROcodone-acetaminophen (NORCO/VICODIN) 5-325 MG per tablet   Oral   Take 1 tablet by mouth every 6 (six) hours as needed.   30 tablet   1   . Lancets (FREESTYLE) lancets   Other   1 each by Other route as directed. Use as instructed          . metFORMIN (GLUCOPHAGE) 500 MG tablet   Oral   Take 250 mg by mouth 2 (two) times daily with a meal. Currently on hold. May restart on 12/26/2011.         . metoprolol tartrate (LOPRESSOR) 25 MG tablet   Oral   Take 12.5 mg by mouth 2 (two) times daily.         . Multiple Vitamin (MULTIVITAMIN) tablet   Oral   Take 1 tablet by mouth daily.           [name] omeprazole (PRILOSEC) 20 MG capsule   Oral   Take 20 mg by mouth daily.         . simvastatin (ZOCOR) 5 MG tablet      TAKE 1 TABLET AT BEDTIME (TO REPLACE LIPITOR)   90 tablet   0     BP 149/86  Pulse 80  Temp(Src) 98.4 F (36.9 C)  Resp 16  Ht 5\' 8"  (1.727 m)  Wt 239 lb (108.41 kg)  BMI 36.35 kg/m2  SpO2 99%  Physical Exam  Nursing note and vitals reviewed. Constitutional: He is oriented to person, place, and time. He appears well-developed and well-nourished. No distress.  HENT:  Head: Normocephalic and atraumatic.  Neck: Normal range of motion. Neck supple.  Musculoskeletal:  The right ankle is noted to have swelling and ttp over the anterior talofibular ligament.  There is no ecchymosis.  There is no proximal fibular ttp, 5th mt ttp.  The ankle appears stable.    Neurological: He is alert and oriented to person, place, and time.  Skin: Skin is warm and dry. He is  not diaphoretic.    ED Course  Procedures (including critical care time)  Labs Reviewed - No data to display No results found.   No diagnosis found.    MDM  Will treat with rest, ice, elevation, time.  Pain meds prn.  To follow up with pcp prn if he worsens.          Geoffery Lyons, MD 08/24/12 2219

## 2012-08-24 NOTE — ED Notes (Signed)
Pt c/o right ankle injury x 11 hrs ago

## 2012-09-19 ENCOUNTER — Ambulatory Visit (HOSPITAL_COMMUNITY)
Admission: RE | Admit: 2012-09-19 | Discharge: 2012-09-19 | Disposition: A | Discharge status: Home / Self Care | Source: Ambulatory Visit | Attending: Specialist | Admitting: Specialist

## 2012-09-19 ENCOUNTER — Other Ambulatory Visit (HOSPITAL_COMMUNITY): Payer: Self-pay | Admitting: Specialist

## 2012-09-19 DIAGNOSIS — Z1389 Encounter for screening for other disorder: Secondary | ICD-10-CM | POA: Insufficient documentation

## 2012-09-19 DIAGNOSIS — Z139 Encounter for screening, unspecified: Secondary | ICD-10-CM

## 2012-09-20 ENCOUNTER — Other Ambulatory Visit: Payer: Self-pay | Admitting: Specialist

## 2012-09-20 ENCOUNTER — Ambulatory Visit
Admission: RE | Admit: 2012-09-20 | Discharge: 2012-09-20 | Disposition: A | Discharge status: Home / Self Care | Source: Ambulatory Visit | Attending: Specialist | Admitting: Specialist

## 2012-09-20 DIAGNOSIS — Z9889 Other specified postprocedural states: Secondary | ICD-10-CM

## 2012-10-03 ENCOUNTER — Other Ambulatory Visit: Payer: Self-pay | Admitting: Family Medicine

## 2012-10-05 ENCOUNTER — Other Ambulatory Visit: Payer: Self-pay | Admitting: Family Medicine

## 2012-10-06 ENCOUNTER — Other Ambulatory Visit: Payer: Self-pay | Admitting: *Deleted

## 2012-10-06 MED ORDER — METOPROLOL TARTRATE 25 MG PO TABS: 25.0000 mg | ORAL_TABLET | Freq: Two times a day (BID) | ORAL | Status: DC

## 2012-10-06 NOTE — Progress Notes (Signed)
Rx sent for #90, pt takes twice daily, should be for #180; resent via escript/SLS

## 2012-10-31 ENCOUNTER — Ambulatory Visit: Admitting: Family Medicine

## 2012-11-02 ENCOUNTER — Ambulatory Visit: Admitting: Family Medicine

## 2012-11-09 ENCOUNTER — Ambulatory Visit: Admitting: Family Medicine

## 2012-11-16 ENCOUNTER — Ambulatory Visit (INDEPENDENT_AMBULATORY_CARE_PROVIDER_SITE_OTHER): Admitting: Family Medicine

## 2012-11-16 ENCOUNTER — Encounter: Payer: Self-pay | Admitting: Family Medicine

## 2012-11-16 VITALS — BP 138/80 | HR 79 | Temp 98.2°F | Ht 68.0 in | Wt 250.1 lb

## 2012-11-16 DIAGNOSIS — I10 Essential (primary) hypertension: Secondary | ICD-10-CM

## 2012-11-16 DIAGNOSIS — K219 Gastro-esophageal reflux disease without esophagitis: Secondary | ICD-10-CM

## 2012-11-16 DIAGNOSIS — E782 Mixed hyperlipidemia: Secondary | ICD-10-CM | POA: Insufficient documentation

## 2012-11-16 DIAGNOSIS — E119 Type 2 diabetes mellitus without complications: Secondary | ICD-10-CM

## 2012-11-16 DIAGNOSIS — Z9884 Bariatric surgery status: Secondary | ICD-10-CM

## 2012-11-16 DIAGNOSIS — E785 Hyperlipidemia, unspecified: Secondary | ICD-10-CM

## 2012-11-16 DIAGNOSIS — I25810 Atherosclerosis of coronary artery bypass graft(s) without angina pectoris: Secondary | ICD-10-CM

## 2012-11-16 DIAGNOSIS — I2510 Atherosclerotic heart disease of native coronary artery without angina pectoris: Secondary | ICD-10-CM

## 2012-11-16 HISTORY — DX: Hyperlipidemia, unspecified: E78.5

## 2012-11-16 LAB — RENAL FUNCTION PANEL
Albumin: 4.5 g/dL (ref 3.5–5.2)
BUN: 12 mg/dL (ref 6–23)
CO2: 27 mEq/L (ref 19–32)
Calcium: 10 mg/dL (ref 8.4–10.5)
Chloride: 103 mEq/L (ref 96–112)
Creat: 0.93 mg/dL (ref 0.50–1.35)
Glucose, Bld: 104 mg/dL — ABNORMAL HIGH (ref 70–99)
Phosphorus: 3.5 mg/dL (ref 2.3–4.6)
Potassium: 4.7 mEq/L (ref 3.5–5.3)
Sodium: 137 mEq/L (ref 135–145)

## 2012-11-16 LAB — LIPID PANEL
Cholesterol: 165 mg/dL (ref 0–200)
HDL: 45 mg/dL (ref >39)
LDL Cholesterol: 94 mg/dL (ref 0–99)
Total CHOL/HDL Ratio: 3.7 Ratio
Triglycerides: 131 mg/dL (ref <150)
VLDL: 26 mg/dL (ref 0–40)

## 2012-11-16 LAB — CBC
HCT: 42 % (ref 39.0–52.0)
Hemoglobin: 14.3 g/dL (ref 13.0–17.0)
MCH: 29.9 pg (ref 26.0–34.0)
MCHC: 34 g/dL (ref 30.0–36.0)
MCV: 87.7 fL (ref 78.0–100.0)
Platelets: 263 10*3/uL (ref 150–400)
RBC: 4.79 MIL/uL (ref 4.22–5.81)
RDW: 13 % (ref 11.5–15.5)
WBC: 5.7 10*3/uL (ref 4.0–10.5)

## 2012-11-16 LAB — HEPATIC FUNCTION PANEL
ALT: 19 U/L (ref 0–53)
AST: 21 U/L (ref 0–37)
Albumin: 4.5 g/dL (ref 3.5–5.2)
Alkaline Phosphatase: 43 U/L (ref 39–117)
Bilirubin, Direct: 0.2 mg/dL (ref 0.0–0.3)
Indirect Bilirubin: 0.5 mg/dL (ref 0.0–0.9)
Total Bilirubin: 0.7 mg/dL (ref 0.3–1.2)
Total Protein: 6.7 g/dL (ref 6.0–8.3)

## 2012-11-16 LAB — HEMOGLOBIN A1C
Hgb A1c MFr Bld: 6.2 % — ABNORMAL HIGH (ref <5.7)
Mean Plasma Glucose: 131 mg/dL — ABNORMAL HIGH (ref <117)

## 2012-11-16 LAB — TSH: TSH: 1.985 u[IU]/mL (ref 0.350–4.500)

## 2012-11-16 LAB — VITAMIN B12: Vitamin B-12: >2000 pg/mL — ABNORMAL HIGH (ref 211–911)

## 2012-11-16 NOTE — Assessment & Plan Note (Signed)
Asymptomatic, following with cardiology 

## 2012-11-16 NOTE — Patient Instructions (Addendum)
Next visit annual exam Diabetes and Exercise Regular exercise is important and can help:   Control blood glucose (sugar).  Decrease blood pressure.    Control blood lipids (cholesterol, triglycerides).  Improve overall health. BENEFITS FROM EXERCISE  Improved fitness.  Improved flexibility.  Improved endurance.  Increased bone density.  Weight control.  Increased muscle strength.  Decreased body fat.  Improvement of the body's use of insulin, a hormone.  Increased insulin sensitivity.  Reduction of insulin needs.  Reduced stress and tension.  Helps you feel better. People with diabetes who add exercise to their lifestyle gain additional benefits, including:  Weight loss.  Reduced appetite.  Improvement of the body's use of blood glucose.  Decreased risk factors for heart disease:  Lowering of cholesterol and triglycerides.  Raising the level of good cholesterol (high-density lipoproteins, HDL).  Lowering blood sugar.  Decreased blood pressure. TYPE 1 DIABETES AND EXERCISE  Exercise will usually lower your blood glucose.  If blood glucose is greater than 240 mg/dl, check urine ketones. If ketones are present, do not exercise.  Location of the insulin injection sites may need to be adjusted with exercise. Avoid injecting insulin into areas of the body that will be exercised. For example, avoid injecting insulin into:  The arms when playing tennis.  The legs when jogging. For more information, discuss this with your caregiver.  Keep a record of:  Food intake.  Type and amount of exercise.  Expected peak times of insulin action.  Blood glucose levels. Do this before, during, and after exercise. Review your records with your caregiver. This will help you to develop guidelines for adjusting food intake and insulin amounts.  TYPE 2 DIABETES AND EXERCISE  Regular physical activity can help control blood glucose.  Exercise is important because  it may:  Increase the body's sensitivity to insulin.  Improve blood glucose control.  Exercise reduces the risk of heart disease. It decreases serum cholesterol and triglycerides. It also lowers blood pressure.  Those who take insulin or oral hypoglycemic agents should watch for signs of hypoglycemia. These signs include dizziness, shaking, sweating, chills, and confusion.  Body water is lost during exercise. It must be replaced. This will help to avoid loss of body fluids (dehydration) or heat stroke. Be sure to talk to your caregiver before starting an exercise program to make sure it is safe for you. Remember, any activity is better than none.  Document Released: 06/27/2003 Document Revised: 06/29/2011 Document Reviewed: 10/11/2008 [facility] Patient Information 2014 [place], [place].

## 2012-11-16 NOTE — Assessment & Plan Note (Addendum)
Slight weight gain, just returned from vacation, encouraged DASH diet and increased exerercise

## 2012-11-16 NOTE — Assessment & Plan Note (Signed)
Avoid trans fats, continue simvastatin, check levels today if well controlled may stop Fenofibrate and recheck in 6 months.

## 2012-11-16 NOTE — Assessment & Plan Note (Signed)
Well controlled with gastric bypass and weight loss, continue Prilosec every other day as needed

## 2012-11-16 NOTE — Progress Notes (Signed)
Patient ID: Brent [name], male   DOB: [birth year], 57 y.o.   MRN: [id number] Brent [name] [id number] 1955-09-15 11/16/2012      Progress Note-Follow Up  Subjective  Chief Complaint  Chief Complaint  Patient presents with  . Follow-up    6 month    HPI  Patient is a 57 year old Caucasian male who is here today in followup. He reports he generally feels well. Did have some trouble with frozen shoulder on the left but after a course of physical therapy and steroid injections that has resolved. He had an MRI but has been debility or pain at this time. His blood sugars have started to trend back up on metformin 250 mg twice daily so he increased back to 500 twice daily and his letters been running roughly 100-126. He denies polyuria or polydipsia. He reports his blood sugars have stabilized. He reports he is reflux is well controlled on Prilosec every other day. Denies chest pain, palpitations, shortness of breath, GI or GU concerns at this time.  Past Medical History  Diagnosis Date  . DM (diabetes mellitus), type 2, uncontrolled   . Acid reflux disease   . Sleep apnea     a. CPAP  . Hx of colonic polyps   . Diverticulosis of colon   . Hypertension 07/14/2010  . Morbid obesity 03/27/2010    a. 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  . 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 HYPERSOMNIA, ASSOCIATED WITH SLEEP APNEA 07/26/2008  . DIVERTICULOSIS, COLON 10/29/2008  . Diarrhea 06/13/2010  . COLONIC POLYPS, HX OF 10/29/2008  . Benign neoplasm of colon 07/05/2007  . ACID REFLUX DISEASE 07/05/2007  . Knee pain, left 10/10/2010  . Tear of meniscus of left knee 2012  . HTN (hypertension) 07/14/2010  . Breast pain, left 11/17/2011  . Preventative health care 11/17/2011  . Chest pain     a. Reportedly negative dobut echo performed prior to gastric bypass in 03/2011;  b. 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 12/2011 Cath: LM nl, LAD 50p, D1 63m, LCX min irregs, OM3 30, RCA 25p, 92m (FFR 0.99->0.89), PDA 30, EF  65%, Med Rx.  . Other and unspecified hyperlipidemia 11/16/2012    Past Surgical History  Procedure Laterality Date  . Ankle surgery  1994  . Tonsillectomy  age 25  . Colonoscopy polyps    . Knee arthroscopy  11/06/10    Left, torn meniscus (repaired)  . Colonoscopy    . Gastric bypass    . Hernia repair      Family History  Problem Relation Age of Onset  . Diabetes Mother   . Hypertension Mother   . Stroke Mother   . Hyperlipidemia Mother   . Hypertension Father   . Colon polyps Father   . Heart attack Father 40  . Stroke Father   . Heart attack Brother   . Diabetes Brother   . Heart disease Brother   . Heart attack Brother     Multiple  . Diabetes Brother   . Other Brother     heart problems  . Heart disease Brother   . Diabetes Sister   . Obesity Brother   . Diabetes Brother   . Hypertension Maternal Grandmother   . ADD / ADHD Daughter   . Stomach cancer Neg Hx     History   Social History  . Marital Status: Married    Spouse Name: N/A    Number of Children: N/A  .  Years of Education: N/A   Occupational History  . Not on file.   Social History Main Topics  . Smoking status: Former Smoker -- 1.50 packs/day for 20 years    Types: Cigarettes    Quit date: 04/21/1991  . Smokeless tobacco: Never Used  . Alcohol Use: No     Comment:  occasionaly- social  . Drug Use: No  . Sexually Active: Yes   Other Topics Concern  . Not on file   Social History Narrative   Lives with wife in [place].  Does not routinely exercise.    Current Outpatient Prescriptions on File Prior to Visit  Medication Sig Dispense Refill  . aspirin 81 MG tablet Take 1 tablet (81 mg total) by mouth daily.      [name] CALCIUM CITRATE PO Take 1,500 mg by mouth daily.      . cetirizine (ZYRTEC) 10 MG tablet Take 10 mg by mouth daily.      . Cholecalciferol (VITAMIN D-3) 5000 UNITS TABS Take 1 tablet by mouth daily.      . Cyanocobalamin (VITAMIN B-12) 1000 MCG SUBL Place 1 each under  the tongue daily.      . fenofibrate 160 MG tablet Take 160 mg by mouth daily.        . fish oil-omega-3 fatty acids 1000 MG capsule Take 2,400 mg by mouth 2 (two) times daily.        [name] glucose blood (FREESTYLE LITE) test strip 1 each by Other route as directed. Use as instructed       . HYDROcodone-acetaminophen (NORCO) 5-325 MG per tablet Take 2 tablets by mouth every 4 (four) hours as needed for pain.  20 tablet  0  . HYDROcodone-acetaminophen (NORCO/VICODIN) 5-325 MG per tablet Take 1 tablet by mouth every 6 (six) hours as needed.  30 tablet  1  . Lancets (FREESTYLE) lancets 1 each by Other route as directed. Use as instructed       . metFORMIN (GLUCOPHAGE) 500 MG tablet Take 250 mg by mouth 2 (two) times daily with a meal.       . metoprolol tartrate (LOPRESSOR) 25 MG tablet Take 1 tablet (25 mg total) by mouth 2 (two) times daily.  180 tablet  1  . Multiple Vitamin (MULTIVITAMIN) tablet Take 1 tablet by mouth daily.        . simvastatin (ZOCOR) 5 MG tablet TAKE 1 TABLET AT BEDTIME (TO REPLACE LIPITOR)  90 tablet  0  . [DISCONTINUED] ramipril (ALTACE) 10 MG tablet Take 1 tablet (10 mg total) by mouth daily. 2 tabs po daily  180 tablet  1   No current facility-administered medications on file prior to visit.    Allergies  Allergen Reactions  . Bee Venom Anaphylaxis  . Lipitor (Atorvastatin)     Myalgias, memory changes  . Morphine     hyperactive    Review of Systems  Review of Systems  Constitutional: Negative for fever and malaise/fatigue.  HENT: Negative for congestion.   Eyes: Negative for discharge.  Respiratory: Negative for shortness of breath.   Cardiovascular: Negative for chest pain, palpitations and leg swelling.  Gastrointestinal: Negative for nausea, abdominal pain and diarrhea.  Genitourinary: Negative for dysuria.  Musculoskeletal: Negative for falls.  Skin: Negative for rash.  Neurological: Negative for loss of consciousness and headaches.   Endo/Heme/Allergies: Negative for polydipsia.  Psychiatric/Behavioral: Negative for depression and suicidal ideas. The patient is not nervous/anxious and does not have insomnia.  Objective  BP 138/80  Pulse 79  Temp(Src) 98.2 F (36.8 C) (Oral)  Ht 5\' 8"  (1.727 m)  Wt 250 lb 1.9 oz (113.454 kg)  BMI 38.04 kg/m2  SpO2 97%  Physical Exam  Physical Exam  Constitutional: He is oriented to person, place, and time and well-developed, well-nourished, and in no distress. No distress.  HENT:  Head: Normocephalic and atraumatic.  Eyes: Conjunctivae are normal.  Neck: Neck supple. No thyromegaly present.  Cardiovascular: Normal rate, regular rhythm and normal heart sounds.   No murmur heard. Pulmonary/Chest: Effort normal and breath sounds normal. No respiratory distress.  Abdominal: He exhibits no distension and no mass. There is no tenderness.  Musculoskeletal: He exhibits no edema.  Neurological: He is alert and oriented to person, place, and time.  Skin: Skin is warm.  Psychiatric: Memory, affect and judgment normal.    Lab Results  Component Value Date   TSH 1.103 05/09/2012   Lab Results  Component Value Date   WBC 5.9 05/09/2012   HGB 14.5 05/09/2012   HCT 42.3 05/09/2012   MCV 85.8 05/09/2012   PLT 265 05/09/2012   Lab Results  Component Value Date   CREATININE 1.00 05/09/2012   BUN 16 05/09/2012   NA 141 05/09/2012   K 4.6 05/09/2012   CL 106 05/09/2012   CO2 27 05/09/2012   Lab Results  Component Value Date   ALT 19 05/09/2012   AST 24 05/09/2012   ALKPHOS 45 05/09/2012   BILITOT 0.8 05/09/2012   Lab Results  Component Value Date   CHOL 124 05/09/2012   Lab Results  Component Value Date   HDL 40 05/09/2012   Lab Results  Component Value Date   LDLCALC 56 05/09/2012   Lab Results  Component Value Date   TRIG 142 05/09/2012   Lab Results  Component Value Date   CHOLHDL 3.1 05/09/2012     Assessment & Plan  HTN (hypertension) Well controlled on  current meds no changes  ACID REFLUX DISEASE Well controlled with gastric bypass and weight loss, continue Prilosec every other day as needed  CAD (coronary artery disease) Asymptomatic, following with cardiology  Diabetes type 2, controlled Well controlled, asymptomatic, check a hgba1c today  Other and unspecified hyperlipidemia Avoid trans fats, continue simvastatin, check levels today if well controlled may stop Fenofibrate and recheck in 6 months.  Morbid obesity Slight weight gain, just returned from vacation, encouraged DASH diet and increased exerercise

## 2012-11-16 NOTE — Assessment & Plan Note (Signed)
Well controlled, asymptomatic, check a hgba1c today

## 2012-11-16 NOTE — Assessment & Plan Note (Signed)
Well controlled on current meds no changes 

## 2012-11-20 LAB — VITAMIN B1: Vitamin B1 (Thiamine): 13 nmol/L (ref 8–30)

## 2012-12-28 ENCOUNTER — Encounter: Payer: Self-pay | Admitting: Family Medicine

## 2013-02-06 ENCOUNTER — Encounter: Payer: Self-pay | Admitting: Family Medicine

## 2013-02-06 MED ORDER — SIMVASTATIN 5 MG PO TABS: 5.0000 mg | ORAL_TABLET | Freq: Every day | ORAL | Status: DC

## 2013-02-06 MED ORDER — FENOFIBRATE 160 MG PO TABS: 160.0000 mg | ORAL_TABLET | Freq: Every day | ORAL | Status: DC

## 2013-02-06 MED ORDER — METFORMIN HCL 500 MG PO TABS: 250.0000 mg | ORAL_TABLET | Freq: Two times a day (BID) | ORAL | Status: DC

## 2013-02-23 ENCOUNTER — Other Ambulatory Visit: Payer: Self-pay

## 2013-02-24 ENCOUNTER — Other Ambulatory Visit: Payer: Self-pay | Admitting: Family Medicine

## 2013-03-03 ENCOUNTER — Encounter: Payer: Self-pay | Admitting: Family Medicine

## 2013-03-03 MED ORDER — METHYLPREDNISOLONE 4 MG PO KIT: PACK | ORAL | Status: DC

## 2013-03-04 ENCOUNTER — Encounter: Payer: Self-pay | Admitting: Family Medicine

## 2013-03-04 ENCOUNTER — Ambulatory Visit (INDEPENDENT_AMBULATORY_CARE_PROVIDER_SITE_OTHER): Admitting: Family Medicine

## 2013-03-04 VITALS — BP 118/78 | HR 75 | Temp 98.6°F | Ht 68.0 in | Wt 249.8 lb

## 2013-03-04 DIAGNOSIS — M109 Gout, unspecified: Secondary | ICD-10-CM

## 2013-03-04 DIAGNOSIS — E119 Type 2 diabetes mellitus without complications: Secondary | ICD-10-CM

## 2013-03-04 DIAGNOSIS — I10 Essential (primary) hypertension: Secondary | ICD-10-CM

## 2013-03-04 HISTORY — DX: Gout, unspecified: M10.9

## 2013-03-04 MED ORDER — COLCHICINE 0.6 MG PO TABS: ORAL_TABLET | ORAL | Status: DC

## 2013-03-04 NOTE — Assessment & Plan Note (Signed)
Increase hydration, medrol dosepak, if no response can try 24 hours of colchicine

## 2013-03-04 NOTE — Progress Notes (Signed)
Pre visit review using our clinic review tool, if applicable. No additional management support is needed unless otherwise documented below in the visit note. 

## 2013-03-04 NOTE — Progress Notes (Signed)
Patient ID: Brent [name], male   DOB: 1956-01-29, 57 y.o.   MRN: [id number] Brent [name] [id number] July 04, 1955 03/04/2013      Progress Note-Follow Up  Subjective  Chief Complaint  Chief Complaint  Patient presents with  . Injections    flu shot  . Gout    flare up- pt hasn't picked up medrol yet    HPI  Patient is a 57 yo caucasian male, swollen and painful great toe and lateral malleolus, h/o gout. Flares about once a year. No other c/o. No fevers, injuries, cp, palp, sob, gi or gu c/o  Past Medical History  Diagnosis Date  . DM (diabetes mellitus), type 2, uncontrolled   . Acid reflux disease   . Sleep apnea     a. CPAP  . Hx of colonic polyps   . Diverticulosis of colon   . Hypertension 07/14/2010  . Morbid obesity 03/27/2010    a. 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  . 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 HYPERSOMNIA, ASSOCIATED WITH SLEEP APNEA 07/26/2008  . DIVERTICULOSIS, COLON 10/29/2008  . Diarrhea 06/13/2010  . COLONIC POLYPS, HX OF 10/29/2008  . Benign neoplasm of colon 07/05/2007  . ACID REFLUX DISEASE 07/05/2007  . Knee pain, left 10/10/2010  . Tear of meniscus of left knee 2012  . HTN (hypertension) 07/14/2010  . Breast pain, left 11/17/2011  . Preventative health care 11/17/2011  . Chest pain     a. Reportedly negative dobut echo performed prior to gastric bypass in 03/2011;  b. 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 12/2011 Cath: LM nl, LAD 50p, D1 7m, LCX min irregs, OM3 30, RCA 25p, 33m (FFR 0.99->0.89), PDA 30, EF 65%, Med Rx.  . Other and unspecified hyperlipidemia 11/16/2012  . Gout 03/04/2013    Past Surgical History  Procedure Laterality Date  . Ankle surgery  1994  . Tonsillectomy  age 44  . Colonoscopy polyps    . Knee arthroscopy  11/06/10    Left, torn meniscus (repaired)  . Colonoscopy    . Gastric bypass    . Hernia repair      Family History  Problem Relation Age of Onset  . Diabetes Mother   . Hypertension Mother   . Stroke Mother   .  Hyperlipidemia Mother   . Hypertension Father   . Colon polyps Father   . Heart attack Father 22  . Stroke Father   . Heart attack Brother   . Diabetes Brother   . Heart disease Brother   . Heart attack Brother     Multiple  . Diabetes Brother   . Other Brother     heart problems  . Heart disease Brother   . Diabetes Sister   . Obesity Brother   . Diabetes Brother   . Hypertension Maternal Grandmother   . ADD / ADHD Daughter   . Stomach cancer Neg Hx     History   Social History  . Marital Status: Married    Spouse Name: N/A    Number of Children: N/A  . Years of Education: N/A   Occupational History  . Not on file.   Social History Main Topics  . Smoking status: Former Smoker -- 1.50 packs/day for 20 years    Types: Cigarettes    Quit date: 04/21/1991  . Smokeless tobacco: Never Used  . Alcohol Use: No     Comment:  occasionaly- social  . Drug Use: No  . Sexual Activity:  Yes   Other Topics Concern  . Not on file   Social History Narrative   Lives with wife in [place].  Does not routinely exercise.    Current Outpatient Prescriptions on File Prior to Visit  Medication Sig Dispense Refill  . aspirin 81 MG tablet Take 1 tablet (81 mg total) by mouth daily.      [name] CALCIUM CITRATE PO Take 1,500 mg by mouth daily.      . cetirizine (ZYRTEC) 10 MG tablet Take 10 mg by mouth daily.      . Cholecalciferol (VITAMIN D-3) 5000 UNITS TABS Take 1 tablet by mouth daily.      . Cyanocobalamin (VITAMIN B-12) 1000 MCG SUBL Place 1 each under the tongue daily.      . fenofibrate 160 MG tablet Take 1 tablet (160 mg total) by mouth daily.  90 tablet  2  . fish oil-omega-3 fatty acids 1000 MG capsule Take 2,400 mg by mouth 2 (two) times daily.        [name] glucose blood (FREESTYLE LITE) test strip 1 each by Other route as directed. Use as instructed       . HYDROcodone-acetaminophen (NORCO/VICODIN) 5-325 MG per tablet Take 1 tablet by mouth every 6 (six) hours as needed.  30  tablet  1  . Lancets (FREESTYLE) lancets 1 each by Other route as directed. Use as instructed       . metoprolol tartrate (LOPRESSOR) 25 MG tablet Take 1 tablet (25 mg total) by mouth 2 (two) times daily.  180 tablet  1  . Multiple Vitamin (MULTIVITAMIN) tablet Take 1 tablet by mouth daily.        [name] omeprazole (PRILOSEC) 20 MG capsule Take 20 mg by mouth every other day.      [name] omeprazole (PRILOSEC) 20 MG capsule TAKE 1 CAPSULE DAILY  90 capsule  0  . simvastatin (ZOCOR) 5 MG tablet Take 1 tablet (5 mg total) by mouth at bedtime.  90 tablet  2  . methylPREDNISolone (MEDROL DOSEPAK) 4 MG tablet follow package directions  21 tablet  0  . [DISCONTINUED] ramipril (ALTACE) 10 MG tablet Take 1 tablet (10 mg total) by mouth daily. 2 tabs po daily  180 tablet  1   No current facility-administered medications on file prior to visit.    Allergies  Allergen Reactions  . Bee Venom Anaphylaxis  . Lipitor [Atorvastatin]     Myalgias, memory changes  . Morphine     hyperactive    Review of Systems  Review of Systems  Constitutional: Negative for fever and malaise/fatigue.  HENT: Negative for congestion.   Eyes: Negative for discharge.  Respiratory: Negative for shortness of breath.   Cardiovascular: Negative for chest pain, palpitations and leg swelling.  Gastrointestinal: Negative for nausea, abdominal pain and diarrhea.  Genitourinary: Negative for dysuria.  Musculoskeletal: Positive for joint pain. Negative for falls.       Great toe pain, swelling and over lateral malleolus  Skin: Negative for rash.  Neurological: Negative for loss of consciousness and headaches.  Endo/Heme/Allergies: Negative for polydipsia.  Psychiatric/Behavioral: Negative for depression and suicidal ideas. The patient is not nervous/anxious and does not have insomnia.     Objective  BP 118/78  Pulse 75  Temp(Src) 98.6 F (37 C) (Oral)  Ht 5\' 8"  (1.727 m)  Wt 249 lb 12.8 oz (113.309 kg)  BMI 37.99 kg/m2  SpO2  97%  Physical Exam  Physical Exam  Constitutional: He is oriented to  person, place, and time and well-developed, well-nourished, and in no distress. No distress.  HENT:  Head: Normocephalic and atraumatic.  Eyes: Conjunctivae are normal.  Neck: Neck supple. No thyromegaly present.  Cardiovascular: Normal rate, regular rhythm and normal heart sounds.   No murmur heard. Pulmonary/Chest: Effort normal and breath sounds normal. No respiratory distress.  Abdominal: He exhibits no distension and no mass. There is no tenderness.  Musculoskeletal: He exhibits edema and tenderness.  Great toe and lateral malleolus, warm and swollen  Neurological: He is alert and oriented to person, place, and time.  Skin: Skin is warm.  Psychiatric: Memory, affect and judgment normal.    Lab Results  Component Value Date   TSH 1.985 11/16/2012   Lab Results  Component Value Date   WBC 5.7 11/16/2012   HGB 14.3 11/16/2012   HCT 42.0 11/16/2012   MCV 87.7 11/16/2012   PLT 263 11/16/2012   Lab Results  Component Value Date   CREATININE 0.93 11/16/2012   BUN 12 11/16/2012   NA 137 11/16/2012   K 4.7 11/16/2012   CL 103 11/16/2012   CO2 27 11/16/2012   Lab Results  Component Value Date   ALT 19 11/16/2012   AST 21 11/16/2012   ALKPHOS 43 11/16/2012   BILITOT 0.7 11/16/2012   Lab Results  Component Value Date   CHOL 165 11/16/2012   Lab Results  Component Value Date   HDL 45 11/16/2012   Lab Results  Component Value Date   LDLCALC 94 11/16/2012   Lab Results  Component Value Date   TRIG 131 11/16/2012   Lab Results  Component Value Date   CHOLHDL 3.7 11/16/2012     Assessment & Plan  Gout Increase hydration, medrol dosepak, if no response can try 24 hours of colchicine  HTN (hypertension) Well controlled, no changes

## 2013-03-04 NOTE — Patient Instructions (Signed)

## 2013-03-04 NOTE — Assessment & Plan Note (Signed)
Well controlled, no changes 

## 2013-03-12 ENCOUNTER — Other Ambulatory Visit: Payer: Self-pay | Admitting: Family Medicine

## 2013-03-22 ENCOUNTER — Telehealth: Payer: Self-pay | Admitting: Internal Medicine

## 2013-03-22 NOTE — Telephone Encounter (Signed)
Pt states he has been having trouble with acid reflux and some abdominal discomfort since Thanksgiving. Pt wonders if he may have an ulcer. Pt scheduled to see Mike Gip PA tomorrow at 3pm. Pt aware of appt.

## 2013-03-23 ENCOUNTER — Other Ambulatory Visit

## 2013-03-23 ENCOUNTER — Other Ambulatory Visit (INDEPENDENT_AMBULATORY_CARE_PROVIDER_SITE_OTHER)

## 2013-03-23 ENCOUNTER — Ambulatory Visit (INDEPENDENT_AMBULATORY_CARE_PROVIDER_SITE_OTHER): Admitting: Physician Assistant

## 2013-03-23 ENCOUNTER — Encounter: Payer: Self-pay | Admitting: Physician Assistant

## 2013-03-23 VITALS — BP 120/76 | HR 84 | Ht 66.5 in | Wt 247.4 lb

## 2013-03-23 DIAGNOSIS — Z9884 Bariatric surgery status: Secondary | ICD-10-CM

## 2013-03-23 DIAGNOSIS — R1013 Epigastric pain: Secondary | ICD-10-CM

## 2013-03-23 DIAGNOSIS — Z860100 Personal history of colon polyps, unspecified: Secondary | ICD-10-CM

## 2013-03-23 DIAGNOSIS — Z860101 Personal history of adenomatous and serrated colon polyps: Secondary | ICD-10-CM

## 2013-03-23 LAB — CREATININE, SERUM: Creatinine, Ser: 0.9 mg/dL (ref 0.4–1.5)

## 2013-03-23 LAB — HEPATIC FUNCTION PANEL
ALT: 17 U/L (ref 0–53)
AST: 19 U/L (ref 0–37)
Albumin: 3.9 g/dL (ref 3.5–5.2)
Alkaline Phosphatase: 39 U/L (ref 39–117)
Bilirubin, Direct: 0.1 mg/dL (ref 0.0–0.3)
Total Bilirubin: 0.6 mg/dL (ref 0.3–1.2)
Total Protein: 6.7 g/dL (ref 6.0–8.3)

## 2013-03-23 LAB — CBC WITH DIFFERENTIAL/PLATELET
Basophils Absolute: 0 10*3/uL (ref 0.0–0.1)
Basophils Relative: 0.4 % (ref 0.0–3.0)
Eosinophils Absolute: 0.2 10*3/uL (ref 0.0–0.7)
Eosinophils Relative: 3.2 % (ref 0.0–5.0)
HCT: 38 % — ABNORMAL LOW (ref 39.0–52.0)
Hemoglobin: 13.1 g/dL (ref 13.0–17.0)
Lymphocytes Relative: 26.8 % (ref 12.0–46.0)
Lymphs Abs: 1.8 10*3/uL (ref 0.7–4.0)
MCHC: 34.5 g/dL (ref 30.0–36.0)
MCV: 86.9 fl (ref 78.0–100.0)
Monocytes Absolute: 0.6 10*3/uL (ref 0.1–1.0)
Monocytes Relative: 8.4 % (ref 3.0–12.0)
Neutro Abs: 4.2 10*3/uL (ref 1.4–7.7)
Neutrophils Relative %: 61.2 % (ref 43.0–77.0)
Platelets: 222 10*3/uL (ref 150.0–400.0)
RBC: 4.38 Mil/uL (ref 4.22–5.81)
RDW: 12.6 % (ref 11.5–14.6)
WBC: 6.9 10*3/uL (ref 4.5–10.5)

## 2013-03-23 LAB — BUN: BUN: 15 mg/dL (ref 6–23)

## 2013-03-23 LAB — LIPASE: Lipase: 39 U/L (ref 11.0–59.0)

## 2013-03-23 MED ORDER — OMEPRAZOLE 20 MG PO CPDR: 20.0000 mg | DELAYED_RELEASE_CAPSULE | Freq: Two times a day (BID) | ORAL | Status: DC

## 2013-03-23 NOTE — Patient Instructions (Signed)
We have sent the following medications to your pharmacy for you to pick up at your convenience: Prilosec twice daily.  Your physician has requested that you go to the basement for the following lab work before leaving today: CBC, Hepatic Panel, Lipase  We will contact you regarding endoscopy after Amy and Dr Marina Goodell discuss your case.

## 2013-03-23 NOTE — Progress Notes (Signed)
Subjective:    Patient ID: Brent [name], male    DOB: [birth year], 57 y.o.   MRN: [id number]  HPI  Brent [name] is a very nice 57 year old white male known to Dr. Marina Goodell who has history of chronic GERD and colon polyps. Medical history pertinent for history of morbid obesity he is now status post gastric bypass with Roux-en-Y 2012 and has lost about 98 pounds. He also has history of hypertension diabetes mellitus and coronary artery disease he is status post CABG and also has obstructive sleep apnea. He underwent EGD preoperatively in 2012 which was normal with the exception of fundic gland polyps and also had colonoscopy in December of 2012 1 diminutive polyp was removed. He does have history of adenomatous polyps and therefore is slated for five-year interval followup He comes in today with complaints of acute upper abdominal pain over the past week. He describes it as a twisty cramping pain in his epigastrium which has been present intermittently. He says he's also had some early satiety symptoms. There's been no nausea or vomiting. He does feel that he's had some increased pain with by mouth intake at times. No fever or chills no diarrhea or melena.. He states that for the past 3 days he had pain off and on all day long and actually today has felt a bit better. He is on chronic Prilosec 20 mg by mouth daily. He has not been using any aspirin or NSAIDs. He says he did have a recent episode of gout and had taken a short course of prednisone about a week and a half ago and also has been taking some cherry extract and wondered if this may have aggravated his stomach.    Review of Systems  Constitutional: Negative.   HENT: Negative.   Eyes: Negative.   Respiratory: Negative.   Cardiovascular: Negative.   Gastrointestinal: Positive for abdominal pain.  Endocrine: Negative.   Genitourinary: Negative.   Musculoskeletal: Negative.   Skin: Negative.   Allergic/Immunologic: Negative.   Neurological:  Negative.   Hematological: Negative.   Psychiatric/Behavioral: Negative.    Outpatient Prescriptions Prior to Visit  Medication Sig Dispense Refill  . aspirin 81 MG tablet Take 1 tablet (81 mg total) by mouth daily.      [name] CALCIUM CITRATE PO Take 1,500 mg by mouth daily.      . cetirizine (ZYRTEC) 10 MG tablet Take 10 mg by mouth daily.      . Cholecalciferol (VITAMIN D-3) 5000 UNITS TABS Take 1 tablet by mouth daily.      . Cyanocobalamin (VITAMIN B-12) 1000 MCG SUBL Place 1 each under the tongue daily.      . fenofibrate 160 MG tablet Take 1 tablet (160 mg total) by mouth daily.  90 tablet  2  . fish oil-omega-3 fatty acids 1000 MG capsule Take 2,400 mg by mouth 2 (two) times daily.        [name] glucose blood (FREESTYLE LITE) test strip 1 each by Other route as directed. Use as instructed       . HYDROcodone-acetaminophen (NORCO/VICODIN) 5-325 MG per tablet Take 1 tablet by mouth every 6 (six) hours as needed.  30 tablet  1  . Lancets (FREESTYLE) lancets 1 each by Other route as directed. Use as instructed       . metFORMIN (GLUCOPHAGE) 500 MG tablet Take 500 mg by mouth 2 (two) times daily with a meal.      . methylPREDNISolone (MEDROL DOSEPAK) 4 MG tablet follow  package directions  21 tablet  0  . Multiple Vitamin (MULTIVITAMIN) tablet Take 1 tablet by mouth daily.        [name] omeprazole (PRILOSEC) 20 MG capsule TAKE 1 CAPSULE DAILY  90 capsule  0  . simvastatin (ZOCOR) 5 MG tablet Take 1 tablet (5 mg total) by mouth at bedtime.  90 tablet  2  . omeprazole (PRILOSEC) 20 MG capsule Take 20 mg by mouth every other day.      . colchicine 0.6 MG tablet 1 tab every 2 hours til pain relief, diarrhea or max of 6 tabs in 24 hours  6 tablet  1  . metoprolol tartrate (LOPRESSOR) 25 MG tablet TAKE 1 TABLET TWO TIMES DAILY  180 tablet  0   No facility-administered medications prior to visit.   Allergies  Allergen Reactions  . Bee Venom Anaphylaxis  . Lipitor [Atorvastatin]     Myalgias, memory  changes  . Morphine     hyperactive   Patient Active Problem List   Diagnosis Date Noted  . H/O gastric bypass 03/23/2013  . Hx of adenomatous colonic polyps 03/23/2013  . Gout 03/04/2013  . Other and unspecified hyperlipidemia 11/16/2012  . OSA (obstructive sleep apnea) 04/07/2012  . CAD (coronary artery disease) 01/12/2012  . Chest pain 12/21/2011  . HTN (hypertension) 07/14/2010  . Morbid obesity 03/27/2010  . DIVERTICULOSIS, COLON 10/29/2008  . Diabetes type 2, controlled 07/05/2007  . ACID REFLUX DISEASE 07/05/2007   History  Substance Use Topics  . Smoking status: Former Smoker -- 1.50 packs/day for 20 years    Types: Cigarettes    Quit date: 04/21/1991  . Smokeless tobacco: Never Used  . Alcohol Use: No     Comment:  occasionaly- social   family history includes ADD / ADHD in his daughter; Colon polyps in his father; Diabetes in his brother, brother, brother, mother, and sister; Heart attack in his brother and brother; Heart attack (age of onset: 73) in his father; Heart disease in his brother and brother; Hyperlipidemia in his mother; Hypertension in his father, maternal grandmother, and mother; Obesity in his brother; Other in his brother; Stroke in his father and mother. There is no history of Stomach cancer.     Objective:   Physical Exam   Well-developed white male in no acute distress, pleasant blood pressure 120/76 pulse 84 height 5 foot 6 weight 247. HEENT nontraumatic normocephalic EOMI PERRLA sclera anicteric, Supple no JVD, Cardiovascular; regular rate and rhythm with S1-S2 no murmur or gallop, Pulmonary clear bilaterally, Abdomen; soft mildly tender in the epigastrium there is no guarding or rebound no palpable mass or hepatosplenomegaly bowel sounds are present, Rectal; exam not done, Extremities; no clubbing cyanosis or edema skin warm and dry, Psych; mood and affect normal and appropriate        Assessment & Plan:  #57  57 year old male status post gastric  bypass 2012, Roux-en-Y now presenting with one-week history of intermittent epigastric pain crampy in nature as well as some early satiety. Etiology not clear Will need to rule out gastritis or anastomotic ulceration versus possible hernia or adhesions related to bypass. #2 chronic GERD #3 coronary artery disease status post CABG #4 diabetes mellitus #5 hypertension #6 sleep apnea #7 history of adenomatous colon polyps last colonoscopy December 2012 plan for 5 year interval followup.  Plan; increase Prilosec to 20 mg by mouth twice daily CBC, cmet  and lipase today Will schedule for upper endoscopy with Dr. Garner Gavel discussed in detail  with the patient and he is agreeable to proceed. Also discussed case with Dr. Marina Goodell  and will schedule for CT scan of the abdomen and pelvis prior to EGD

## 2013-03-24 ENCOUNTER — Ambulatory Visit (INDEPENDENT_AMBULATORY_CARE_PROVIDER_SITE_OTHER)
Admission: RE | Admit: 2013-03-24 | Discharge: 2013-03-24 | Disposition: A | Discharge status: Home / Self Care | Source: Ambulatory Visit | Attending: Physician Assistant | Admitting: Physician Assistant

## 2013-03-24 ENCOUNTER — Encounter: Payer: Self-pay | Admitting: Physician Assistant

## 2013-03-24 DIAGNOSIS — R1013 Epigastric pain: Secondary | ICD-10-CM

## 2013-03-24 DIAGNOSIS — Z86010 Personal history of colonic polyps: Secondary | ICD-10-CM

## 2013-03-24 DIAGNOSIS — Z9884 Bariatric surgery status: Secondary | ICD-10-CM

## 2013-03-24 MED ORDER — IOHEXOL 300 MG/ML  SOLN
100.0000 mL | Freq: Once | INTRAMUSCULAR | Status: AC | PRN
  Administered 2013-03-24: 100 mL via INTRAVENOUS

## 2013-03-24 NOTE — Progress Notes (Signed)
As above. Case discussed. Plans as outlined

## 2013-03-27 ENCOUNTER — Encounter (HOSPITAL_COMMUNITY)
Admission: RE | Disposition: A | Discharge status: Home / Self Care | Payer: Self-pay | Source: Ambulatory Visit | Attending: Internal Medicine

## 2013-03-27 ENCOUNTER — Ambulatory Visit (HOSPITAL_COMMUNITY)
Admission: RE | Admit: 2013-03-27 | Discharge: 2013-03-27 | Disposition: A | Discharge status: Home / Self Care | Source: Ambulatory Visit | Attending: Internal Medicine | Admitting: Internal Medicine

## 2013-03-27 ENCOUNTER — Encounter (HOSPITAL_COMMUNITY): Payer: Self-pay | Admitting: *Deleted

## 2013-03-27 DIAGNOSIS — Z860101 Personal history of adenomatous and serrated colon polyps: Secondary | ICD-10-CM

## 2013-03-27 DIAGNOSIS — Z9884 Bariatric surgery status: Secondary | ICD-10-CM

## 2013-03-27 DIAGNOSIS — R109 Unspecified abdominal pain: Secondary | ICD-10-CM | POA: Insufficient documentation

## 2013-03-27 DIAGNOSIS — R1013 Epigastric pain: Secondary | ICD-10-CM

## 2013-03-27 DIAGNOSIS — Z86010 Personal history of colonic polyps: Secondary | ICD-10-CM

## 2013-03-27 HISTORY — PX: ESOPHAGOGASTRODUODENOSCOPY: SHX5428

## 2013-03-27 HISTORY — PX: UPPER GI ENDOSCOPY: SHX6162

## 2013-03-27 LAB — GLUCOSE, CAPILLARY: Glucose-Capillary: 139 mg/dL — ABNORMAL HIGH (ref 70–99)

## 2013-03-27 SURGERY — EGD (ESOPHAGOGASTRODUODENOSCOPY)
Anesthesia: Moderate Sedation

## 2013-03-27 MED ORDER — DIPHENHYDRAMINE HCL 50 MG/ML IJ SOLN
INTRAMUSCULAR | Status: AC
  Filled 2013-03-27: qty 1

## 2013-03-27 MED ORDER — BUTAMBEN-TETRACAINE-BENZOCAINE 2-2-14 % EX AERO
INHALATION_SPRAY | CUTANEOUS | Status: DC | PRN
  Administered 2013-03-27: 2 via TOPICAL

## 2013-03-27 MED ORDER — FENTANYL CITRATE 0.05 MG/ML IJ SOLN
INTRAMUSCULAR | Status: AC
  Filled 2013-03-27: qty 2

## 2013-03-27 MED ORDER — FENTANYL CITRATE 0.05 MG/ML IJ SOLN
INTRAMUSCULAR | Status: DC | PRN
  Administered 2013-03-27 (×4): 25 ug via INTRAVENOUS

## 2013-03-27 MED ORDER — MIDAZOLAM HCL 10 MG/2ML IJ SOLN
INTRAMUSCULAR | Status: DC | PRN
  Administered 2013-03-27 (×5): 2 mg via INTRAVENOUS

## 2013-03-27 MED ORDER — MIDAZOLAM HCL 10 MG/2ML IJ SOLN
INTRAMUSCULAR | Status: AC
  Filled 2013-03-27: qty 2

## 2013-03-27 MED ORDER — SODIUM CHLORIDE 0.9 % IV SOLN
INTRAVENOUS | Status: DC
  Administered 2013-03-27: 500 mL via INTRAVENOUS

## 2013-03-27 NOTE — H&P (View-Only) (Signed)
As above. Case discussed. Plans as outlined 

## 2013-03-27 NOTE — Interval H&P Note (Signed)
History and Physical Interval Note:  C. recent office evaluation. CT scan negative. Will proceed with EGD.  03/27/2013 1:52 PM  Brent [name]  has presented today for surgery, with the diagnosis of epigastric abdominal pain  The various methods of treatment have been discussed with the patient and family. After consideration of risks, benefits and other options for treatment, the patient has consented to  Procedure(s): ESOPHAGOGASTRODUODENOSCOPY (EGD) (N/A) as a surgical intervention .  The patient's history has been reviewed, patient examined, no change in status, stable for surgery.  I have reviewed the patient's chart and labs.  Questions were answered to the patient's satisfaction.     [name]

## 2013-03-27 NOTE — Op Note (Signed)
[facility] [address] [place], [address]   ENDOSCOPY PROCEDURE REPORT  PATIENT: Brent [name], Brent [name]  MR#: [id number] BIRTHDATE: Jul 06, 1955 , 57  yrs. old GENDER: Male ENDOSCOPIST: Roxy Cedar, MD REFERRED BY:  .  Self / Office PROCEDURE DATE:  03/27/2013 PROCEDURE:  EGD, diagnostic ASA CLASS:     Class II INDICATIONS:  abdominal pain. MEDICATIONS: Fentanyl 100 mcg IV and Versed 10 mg IV TOPICAL ANESTHETIC: Cetacaine Spray  DESCRIPTION OF PROCEDURE: After the risks benefits and alternatives of the procedure were thoroughly explained, informed consent was obtained.  The Pentax Gastroscope [id number] endoscope was introduced through the mouth and advanced to the mid jejunum. Without limitations.  The instrument was slowly withdrawn as the mucosa was fully examined.      Normal esophagus.  Normal gastric remnant post Roux-en-Y gastric bypass surgery.  No ulcer or stenosis.  Normal small bowel. Retroflexed views revealed no abnormalities.     The scope was then withdrawn from the patient and the procedure completed.  COMPLICATIONS: There were no complications. ENDOSCOPIC IMPRESSION: 1. Normal postop exam status post Roux-en-Y gastric bypass 2. No cause for pain found. Negative recent CT  RECOMMENDATIONS: 1. Small meals 2. Followup as needed.  REPEAT EXAM:  eSigned:  Roxy Cedar, MD 03/27/2013 3:02 PM   CC:The Patient

## 2013-03-28 ENCOUNTER — Inpatient Hospital Stay (HOSPITAL_COMMUNITY)
Admission: EM | Admit: 2013-03-28 | Discharge: 2013-04-03 | DRG: 377 | Disposition: A | Discharge status: Home / Self Care | Attending: Internal Medicine | Admitting: Internal Medicine

## 2013-03-28 ENCOUNTER — Inpatient Hospital Stay (HOSPITAL_COMMUNITY)

## 2013-03-28 ENCOUNTER — Telehealth: Payer: Self-pay | Admitting: Internal Medicine

## 2013-03-28 ENCOUNTER — Encounter (HOSPITAL_COMMUNITY): Payer: Self-pay | Admitting: Emergency Medicine

## 2013-03-28 DIAGNOSIS — E119 Type 2 diabetes mellitus without complications: Secondary | ICD-10-CM | POA: Diagnosis present

## 2013-03-28 DIAGNOSIS — Z79899 Other long term (current) drug therapy: Secondary | ICD-10-CM

## 2013-03-28 DIAGNOSIS — D62 Acute posthemorrhagic anemia: Secondary | ICD-10-CM | POA: Diagnosis present

## 2013-03-28 DIAGNOSIS — E785 Hyperlipidemia, unspecified: Secondary | ICD-10-CM | POA: Diagnosis present

## 2013-03-28 DIAGNOSIS — K922 Gastrointestinal hemorrhage, unspecified: Secondary | ICD-10-CM

## 2013-03-28 DIAGNOSIS — M109 Gout, unspecified: Secondary | ICD-10-CM | POA: Diagnosis present

## 2013-03-28 DIAGNOSIS — Z833 Family history of diabetes mellitus: Secondary | ICD-10-CM

## 2013-03-28 DIAGNOSIS — E872 Acidosis, unspecified: Secondary | ICD-10-CM | POA: Diagnosis present

## 2013-03-28 DIAGNOSIS — K5730 Diverticulosis of large intestine without perforation or abscess without bleeding: Secondary | ICD-10-CM | POA: Diagnosis present

## 2013-03-28 DIAGNOSIS — Z860100 Personal history of colon polyps, unspecified: Secondary | ICD-10-CM

## 2013-03-28 DIAGNOSIS — K219 Gastro-esophageal reflux disease without esophagitis: Secondary | ICD-10-CM | POA: Diagnosis present

## 2013-03-28 DIAGNOSIS — Z6838 Body mass index (BMI) 38.0-38.9, adult: Secondary | ICD-10-CM

## 2013-03-28 DIAGNOSIS — Z8249 Family history of ischemic heart disease and other diseases of the circulatory system: Secondary | ICD-10-CM

## 2013-03-28 DIAGNOSIS — Z9884 Bariatric surgery status: Secondary | ICD-10-CM

## 2013-03-28 DIAGNOSIS — Z823 Family history of stroke: Secondary | ICD-10-CM

## 2013-03-28 DIAGNOSIS — G4733 Obstructive sleep apnea (adult) (pediatric): Secondary | ICD-10-CM | POA: Diagnosis present

## 2013-03-28 DIAGNOSIS — R1013 Epigastric pain: Secondary | ICD-10-CM

## 2013-03-28 DIAGNOSIS — K259 Gastric ulcer, unspecified as acute or chronic, without hemorrhage or perforation: Secondary | ICD-10-CM | POA: Diagnosis present

## 2013-03-28 DIAGNOSIS — R578 Other shock: Secondary | ICD-10-CM | POA: Diagnosis present

## 2013-03-28 DIAGNOSIS — I10 Essential (primary) hypertension: Secondary | ICD-10-CM | POA: Diagnosis present

## 2013-03-28 DIAGNOSIS — Z83719 Family history of colon polyps, unspecified: Secondary | ICD-10-CM

## 2013-03-28 DIAGNOSIS — R079 Chest pain, unspecified: Secondary | ICD-10-CM

## 2013-03-28 DIAGNOSIS — D649 Anemia, unspecified: Secondary | ICD-10-CM | POA: Diagnosis present

## 2013-03-28 DIAGNOSIS — Z87891 Personal history of nicotine dependence: Secondary | ICD-10-CM

## 2013-03-28 DIAGNOSIS — I2510 Atherosclerotic heart disease of native coronary artery without angina pectoris: Secondary | ICD-10-CM | POA: Diagnosis present

## 2013-03-28 DIAGNOSIS — Z7982 Long term (current) use of aspirin: Secondary | ICD-10-CM

## 2013-03-28 DIAGNOSIS — Z8371 Family history of colonic polyps: Secondary | ICD-10-CM

## 2013-03-28 LAB — COMPREHENSIVE METABOLIC PANEL
ALT: 12 U/L (ref 0–53)
AST: 15 U/L (ref 0–37)
Albumin: 3.1 g/dL — ABNORMAL LOW (ref 3.5–5.2)
Alkaline Phosphatase: 35 U/L — ABNORMAL LOW (ref 39–117)
BUN: 27 mg/dL — ABNORMAL HIGH (ref 6–23)
CO2: 23 mEq/L (ref 19–32)
Calcium: 8.3 mg/dL — ABNORMAL LOW (ref 8.4–10.5)
Chloride: 100 mEq/L (ref 96–112)
Creatinine, Ser: 0.88 mg/dL (ref 0.50–1.35)
GFR calc Af Amer: >90 mL/min (ref >90)
GFR calc non Af Amer: >90 mL/min (ref >90)
Glucose, Bld: 267 mg/dL — ABNORMAL HIGH (ref 70–99)
Potassium: 3.7 mEq/L (ref 3.5–5.1)
Sodium: 134 mEq/L — ABNORMAL LOW (ref 135–145)
Total Bilirubin: 0.3 mg/dL (ref 0.3–1.2)
Total Protein: 5.3 g/dL — ABNORMAL LOW (ref 6.0–8.3)

## 2013-03-28 LAB — CBC WITH DIFFERENTIAL/PLATELET
Basophils Absolute: 0 10*3/uL (ref 0.0–0.1)
Basophils Relative: 1 % (ref 0–1)
Eosinophils Absolute: 0.1 10*3/uL (ref 0.0–0.7)
Eosinophils Relative: 2 % (ref 0–5)
HCT: 23 % — ABNORMAL LOW (ref 39.0–52.0)
Hemoglobin: 8 g/dL — ABNORMAL LOW (ref 13.0–17.0)
Lymphocytes Relative: 32 % (ref 12–46)
Lymphs Abs: 2.2 10*3/uL (ref 0.7–4.0)
MCH: 30.3 pg (ref 26.0–34.0)
MCHC: 34.8 g/dL (ref 30.0–36.0)
MCV: 87.1 fL (ref 78.0–100.0)
Monocytes Absolute: 0.6 10*3/uL (ref 0.1–1.0)
Monocytes Relative: 9 % (ref 3–12)
Neutro Abs: 3.9 10*3/uL (ref 1.7–7.7)
Neutrophils Relative %: 57 % (ref 43–77)
Platelets: 297 10*3/uL (ref 150–400)
RBC: 2.64 MIL/uL — ABNORMAL LOW (ref 4.22–5.81)
RDW: 12.8 % (ref 11.5–15.5)
WBC: 6.9 10*3/uL (ref 4.0–10.5)

## 2013-03-28 LAB — POCT I-STAT, CHEM 8
BUN: 26 mg/dL — ABNORMAL HIGH (ref 6–23)
Calcium, Ion: 1.15 mmol/L (ref 1.12–1.23)
Chloride: 100 mEq/L (ref 96–112)
Creatinine, Ser: 1 mg/dL (ref 0.50–1.35)
Glucose, Bld: 264 mg/dL — ABNORMAL HIGH (ref 70–99)
HCT: 23 % — ABNORMAL LOW (ref 39.0–52.0)
Hemoglobin: 7.8 g/dL — ABNORMAL LOW (ref 13.0–17.0)
Potassium: 3.8 mEq/L (ref 3.5–5.1)
Sodium: 138 mEq/L (ref 135–145)
TCO2: 21 mmol/L (ref 0–100)

## 2013-03-28 LAB — OCCULT BLOOD, POC DEVICE: Fecal Occult Bld: POSITIVE — AB

## 2013-03-28 LAB — CG4 I-STAT (LACTIC ACID): Lactic Acid, Venous: 3.02 mmol/L — ABNORMAL HIGH (ref 0.5–2.2)

## 2013-03-28 LAB — ABO/RH: ABO/RH(D): A POS

## 2013-03-28 MED ORDER — SODIUM CHLORIDE 0.9 % IV SOLN
80.0000 mg | Freq: Once | INTRAVENOUS | Status: AC
  Administered 2013-03-28: 80 mg via INTRAVENOUS
  Filled 2013-03-28: qty 80

## 2013-03-28 MED ORDER — SODIUM CHLORIDE 0.9 % IV SOLN
8.0000 mg/h | INTRAVENOUS | Status: AC
  Administered 2013-03-29 – 2013-03-31 (×5): 8 mg/h via INTRAVENOUS
  Filled 2013-03-28 (×15): qty 80

## 2013-03-28 MED ORDER — TECHNETIUM TC 99M-LABELED RED BLOOD CELLS IV KIT
21.6000 | PACK | Freq: Once | INTRAVENOUS | Status: AC | PRN
  Administered 2013-03-28: 22 via INTRAVENOUS

## 2013-03-28 MED ORDER — SODIUM CHLORIDE 0.9 % IV BOLUS (SEPSIS)
1000.0000 mL | Freq: Once | INTRAVENOUS | Status: AC
  Administered 2013-03-28: 1000 mL via INTRAVENOUS

## 2013-03-28 NOTE — Telephone Encounter (Signed)
Pts wife states that pt had an EGD yesterday. States that he has had blood in his stool, he is pale and weak. Pt gets dizzy when he stands up. Wife reports a very foul odor with the bloody stools. Wife instructed to take the pt to the ER for evaluation. Dr. Marina Goodell and Dr. Christella Hartigan notified.

## 2013-03-28 NOTE — ED Provider Notes (Signed)
CSN: [id number]     Arrival date & time 03/28/13  1737 History   First MD Initiated Contact with Patient 03/28/13 1756     Chief Complaint  Patient presents with  . Hypotension   (Consider location/radiation/quality/duration/timing/severity/associated sxs/prior Treatment) HPI  This is a 57 year old male with a history of gastric bypass, diabetes, diverticulosis, and hypertension who presents with bloody stools, syncope, and dizziness. I was asked to evaluate the patient emergently following a syncopal episode in triage. Patient's blood pressure was noted to be 68/38.  Patient is able to provide history to me. He states that he feels dizzy. He has had a one-week history of abdominal pain for which he has had a CT scan and an EGD which have been unremarkable. Patient to a large bloody stool prior to arrival. Both the patient and his wife report a large bowel movement with bright red blood in the toilet. She reports he had a dark stool on Friday but this is the first bloody stool he has had. He denies any vomiting.   He also reports abdominal pain. Pain is epigastric and nonradiating.  Currently 4/10.  Patient states his pain is gotten better since he is presented to the emergency room.  Past Medical History  Diagnosis Date  . DM (diabetes mellitus), type 2, uncontrolled   . Acid reflux disease   . Sleep apnea     a. CPAP  . Hx of colonic polyps   . Diverticulosis of colon   . Hypertension 07/14/2010  . Morbid obesity 03/27/2010    a. 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  . 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 HYPERSOMNIA, ASSOCIATED WITH SLEEP APNEA 07/26/2008  . DIVERTICULOSIS, COLON 10/29/2008  . Diarrhea 06/13/2010  . COLONIC POLYPS, HX OF 10/29/2008  . Benign neoplasm of colon 07/05/2007  . ACID REFLUX DISEASE 07/05/2007  . Knee pain, left 10/10/2010  . Tear of meniscus of left knee 2012  . HTN (hypertension) 07/14/2010  . Breast pain, left 11/17/2011  . Preventative health care 11/17/2011  .  Chest pain     a. Reportedly negative dobut echo performed prior to gastric bypass in 03/2011;  b. 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 12/2011 Cath: LM nl, LAD 50p, D1 42m, LCX min irregs, OM3 30, RCA 25p, 41m (FFR 0.99->0.89), PDA 30, EF 65%, Med Rx.  . Other and unspecified hyperlipidemia 11/16/2012  . Gout 03/04/2013   Past Surgical History  Procedure Laterality Date  . Ankle surgery  1994  . Tonsillectomy  age 22  . Colonoscopy polyps    . Knee arthroscopy  11/06/10    Left, torn meniscus (repaired)  . Colonoscopy    . Gastric bypass    . Hernia repair     Family History  Problem Relation Age of Onset  . Diabetes Mother   . Hypertension Mother   . Stroke Mother   . Hyperlipidemia Mother   . Hypertension Father   . Colon polyps Father   . Heart attack Father 52  . Stroke Father   . Heart attack Brother   . Diabetes Brother   . Heart disease Brother   . Heart attack Brother     Multiple  . Diabetes Brother   . Other Brother     heart problems  . Heart disease Brother   . Diabetes Sister   . Obesity Brother   . Diabetes Brother   . Hypertension Maternal Grandmother   . ADD / ADHD Daughter   .  Stomach cancer Neg Hx    History  Substance Use Topics  . Smoking status: Former Smoker -- 1.50 packs/day for 20 years    Types: Cigarettes    Quit date: 04/21/1991  . Smokeless tobacco: Never Used  . Alcohol Use: No     Comment:  occasionaly- social    Review of Systems  Constitutional: Negative.  Negative for fever.  Respiratory: Negative.  Negative for chest tightness and shortness of breath.   Cardiovascular: Negative.  Negative for chest pain.  Gastrointestinal: Positive for abdominal pain and blood in stool. Negative for vomiting.  Genitourinary: Negative.  Negative for dysuria.  Musculoskeletal: Negative for back pain.  Skin: Positive for pallor.  Neurological: Positive for dizziness, syncope and light-headedness. Negative for headaches.  All other systems  reviewed and are negative.    Allergies  Bee venom; Lipitor; and Morphine  Home Medications   Current Outpatient Rx  Name  Route  Sig  Dispense  Refill  . aspirin 81 MG tablet   Oral   Take 1 tablet (81 mg total) by mouth daily.         [name] CALCIUM CITRATE PO   Oral   Take 1,500 mg by mouth daily.         . Cholecalciferol (VITAMIN D-3) 5000 UNITS TABS   Oral   Take 1 tablet by mouth daily.         . colchicine 0.6 MG tablet      1 tab every 2 hours til pain relief, diarrhea or max of 6 tabs in 24 hours   6 tablet   1   . Cyanocobalamin (VITAMIN B-12) 1000 MCG SUBL   Sublingual   Place 1 each under the tongue daily.         . fenofibrate 160 MG tablet   Oral   Take 1 tablet (160 mg total) by mouth daily.   90 tablet   2   . fish oil-omega-3 fatty acids 1000 MG capsule   Oral   Take 2,400 mg by mouth 2 (two) times daily.           . metFORMIN (GLUCOPHAGE) 500 MG tablet   Oral   Take 500 mg by mouth 2 (two) times daily with a meal.         . metoprolol tartrate (LOPRESSOR) 25 MG tablet      TAKE 1/2 TABLET TWO TIMES DAILY         . Multiple Vitamin (MULTIVITAMIN) tablet   Oral   Take 1 tablet by mouth daily.           [name] omeprazole (PRILOSEC) 20 MG capsule      TAKE 1 CAPSULE DAILY   90 capsule   0   . simvastatin (ZOCOR) 5 MG tablet   Oral   Take 1 tablet (5 mg total) by mouth at bedtime.   90 tablet   2   . VITAMIN E COMPLEX PO   Oral   Take 1 capsule by mouth daily. Patient unsure of exact dose         . EXPIRED: cetirizine (ZYRTEC) 10 MG tablet   Oral   Take 10 mg by mouth daily.          BP 110/65  Pulse 85  Temp(Src) 98 F (36.7 C) (Oral)  Resp 16  SpO2 100% Physical Exam  Nursing note and vitals reviewed. Constitutional: He is oriented to person, place, and time.  Pale, no acute distress  HENT:  Head: Normocephalic and atraumatic.  Mouth/Throat: Oropharynx is clear and moist.  Eyes: Pupils are equal, round,  and reactive to light.  Conjunctiva pale  Neck: Neck supple.  Cardiovascular: Regular rhythm and normal heart sounds.   No murmur heard. Tachycardia  Pulmonary/Chest: Effort normal and breath sounds normal. No respiratory distress. He has no wheezes.  Abdominal: Soft. Bowel sounds are normal. He exhibits no distension. There is no tenderness. There is no rebound.  Genitourinary: Guaiac positive stool.  Melena noted with speckled BRB  Musculoskeletal: He exhibits no edema.  Lymphadenopathy:    He has no cervical adenopathy.  Neurological: He is alert and oriented to person, place, and time.  Skin: Skin is warm and dry.  Psychiatric: He has a normal mood and affect.    ED Course  Procedures (including critical care time) Labs Review Labs Reviewed  CBC WITH DIFFERENTIAL - Abnormal; Notable for the following:    RBC 2.64 (*)    Hemoglobin 8.0 (*)    HCT 23.0 (*)    All other components within normal limits  COMPREHENSIVE METABOLIC PANEL - Abnormal; Notable for the following:    Sodium 134 (*)    Glucose, Bld 267 (*)    BUN 27 (*)    Calcium 8.3 (*)    Total Protein 5.3 (*)    Albumin 3.1 (*)    Alkaline Phosphatase 35 (*)    All other components within normal limits  OCCULT BLOOD, POC DEVICE - Abnormal; Notable for the following:    Fecal Occult Bld POSITIVE (*)    All other components within normal limits  CG4 I-STAT (LACTIC ACID) - Abnormal; Notable for the following:    Lactic Acid, Venous 3.02 (*)    All other components within normal limits  POCT I-STAT, CHEM 8 - Abnormal; Notable for the following:    BUN 26 (*)    Glucose, Bld 264 (*)    Hemoglobin 7.8 (*)    HCT 23.0 (*)    All other components within normal limits  TYPE AND SCREEN  ABO/RH   Imaging Review No results found.  EKG Interpretation   None      CRITICAL CARE Performed by: Ross Marcus, F   Total critical care time: 35 min  Critical care time was exclusive of separately billable  procedures and treating other patients.  Critical care was necessary to treat or prevent imminent or life-threatening deterioration.  Critical care was time spent personally by me on the following activities: development of treatment plan with patient and/or surrogate as well as nursing, discussions with consultants, evaluation of patient's response to treatment, examination of patient, obtaining history from patient or surrogate, ordering and performing treatments and interventions, ordering and review of laboratory studies, ordering and review of radiographic studies, pulse oximetry and re-evaluation of patient's condition.  MDM   1. GI bleed   2. Lactic acidosis   3.  Hypovolemic shock   Patient presents with hypertension and her blood per rectum. Initially patient syncopized in triage and had a blood pressure with a systolic in the 60s. Patient was laid flat and 2 large bore IVs were established. Normal saline was initiated and the patient's blood pressure returned to low 100s. He is pale-appearing. He has evidence of active bleeding. Lab work including type and screen were obtained. Hemoglobin noted to be 7.8 down from 13.1 five days ago.   I discussed the patient with Dr. Christella Hartigan, GI. He has requested patient be admitted to  the hospitalist service and a nuclear medicine GI bleeding scan be obtained. He suspects the patient may be bleeding from his remnant stomach following gastric bypass.  Patient was discussed with Dr. Conley Rolls who will admit.    Shon Baton, MD 03/29/13 [phone]

## 2013-03-28 NOTE — Telephone Encounter (Signed)
i was called by ER MD at [facility]  Melenic stools, 5gm drop in Hb in 5 days.  BUN elevated.  He is being admitted to hosp service, [facility] step down.  I suspect he may be bleeding from Roux limb of g-bypass (impossible to visualize by EGD).  He will be transfused, stablized.  I recommended IV PPI bolus and drip and NUC med bleeding scan; may help localize bleeding source.

## 2013-03-28 NOTE — ED Notes (Signed)
Pt reports to ED with blood in stool, hypotension, weakness.

## 2013-03-28 NOTE — H&P (Signed)
[facility] History and Physical  Brent [name] QQV:[id number] DOB: April 09, 1956    PCP:   Danise Edge, MD   Chief Complaint: abdominal pain, dizziness, and BRBPR.  HPI: Brent [name] is an 57 y.o. male with hx of DM2, CAD, sleep apnea, gout, s/p Roux en Y gastric bypass 2 years ago at [facility], presents to the [facility] ER with one episode of syncope, BRBPR with some melena, a drop of 5 grams of Hb over several days, BUN of 27, and mild abdominal pain.  The work up for his abdominal pain, which started after Thxgiving, was initiated and included a abd/pelvic CT which was unremarkable, along with an EGD which was done by Dr Marina Goodell yesterday, showing no bleeding.  He also had an episode of acute gouty attack for which he took some colchicine uneventfully and had relief.  His Hb in the ER is 8grams per dL, Cr of 1.0, BUN 26.  GI was consulted, querried bleeding from the gastric remnant,  recommended nuclear bleeding scan, transfusion, and IV PPI.  Hospitalist was asked to admit him for acute GI bleed.  Rewiew of Systems:  Constitutional: Negative for malaise, fever and chills. No significant weight loss or weight gain Eyes: Negative for eye pain, redness and discharge, diplopia, visual changes, or flashes of light. ENMT: Negative for ear pain, hoarseness, nasal congestion, sinus pressure and sore throat. No headaches; tinnitus, drooling, or problem swallowing. Cardiovascular: Negative for chest pain, palpitations, diaphoresis, dyspnea and peripheral edema. ; No orthopnea, PND Respiratory: Negative for cough, hemoptysis, wheezing and stridor. No pleuritic chestpain. Gastrointestinal: Negative for nausea, vomiting, diarrhea, constipation,  hematemesis, jaundice and rectal bleeding.    Genitourinary: Negative for frequency, dysuria, incontinence,flank pain and hematuria; Musculoskeletal: Negative for back pain and neck pain. Negative for swelling and trauma.;  Skin: . Negative for pruritus, rash,  abrasions, bruising and skin lesion.; ulcerations Neuro: Negative for headache, lightheadedness and neck stiffness. Negative for weakness, altered level of consciousness , altered mental status, extremity weakness, burning feet, involuntary movement, seizure and syncope.  Psych: negative for anxiety, depression, insomnia, tearfulness, panic attacks, hallucinations, paranoia, suicidal or homicidal ideation    Past Medical History  Diagnosis Date  . DM (diabetes mellitus), type 2, uncontrolled   . Acid reflux disease   . Sleep apnea     a. CPAP  . Hx of colonic polyps   . Diverticulosis of colon   . Hypertension 07/14/2010  . Morbid obesity 03/27/2010    a. 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  . 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 HYPERSOMNIA, ASSOCIATED WITH SLEEP APNEA 07/26/2008  . DIVERTICULOSIS, COLON 10/29/2008  . Diarrhea 06/13/2010  . COLONIC POLYPS, HX OF 10/29/2008  . Benign neoplasm of colon 07/05/2007  . ACID REFLUX DISEASE 07/05/2007  . Knee pain, left 10/10/2010  . Tear of meniscus of left knee 2012  . HTN (hypertension) 07/14/2010  . Breast pain, left 11/17/2011  . Preventative health care 11/17/2011  . Chest pain     a. Reportedly negative dobut echo performed prior to gastric bypass in 03/2011;  b. 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 12/2011 Cath: LM nl, LAD 50p, D1 41m, LCX min irregs, OM3 30, RCA 25p, 58m (FFR 0.99->0.89), PDA 30, EF 65%, Med Rx.  . Other and unspecified hyperlipidemia 11/16/2012  . Gout 03/04/2013    Past Surgical History  Procedure Laterality Date  . Ankle surgery  1994  . Tonsillectomy  age 53  . Colonoscopy polyps    .  Knee arthroscopy  11/06/10    Left, torn meniscus (repaired)  . Colonoscopy    . Gastric bypass    . Hernia repair    . Upper gi endoscopy  03/27/13    Medications:  HOME MEDS: Prior to Admission medications   Medication Sig Start Date End Date Taking? Authorizing Provider  aspirin 81 MG tablet Take 1 tablet (81 mg total) by mouth  daily. 06/08/11  Yes Bradd Canary, MD  CALCIUM CITRATE PO Take 1,500 mg by mouth daily.   Yes Historical Provider, MD  Cholecalciferol (VITAMIN D-3) 5000 UNITS TABS Take 1 tablet by mouth daily.   Yes Historical Provider, MD  colchicine 0.6 MG tablet 1 tab every 2 hours til pain relief, diarrhea or max of 6 tabs in 24 hours 03/04/13  Yes Bradd Canary, MD  Cyanocobalamin (VITAMIN B-12) 1000 MCG SUBL Place 1 each under the tongue daily.   Yes Historical Provider, MD  fenofibrate 160 MG tablet Take 1 tablet (160 mg total) by mouth daily. 02/06/13  Yes Bradd Canary, MD  fish oil-omega-3 fatty acids 1000 MG capsule Take 2,400 mg by mouth 2 (two) times daily.     Yes Historical Provider, MD  metFORMIN (GLUCOPHAGE) 500 MG tablet Take 500 mg by mouth 2 (two) times daily with a meal. 02/06/13  Yes Bradd Canary, MD  metoprolol tartrate (LOPRESSOR) 25 MG tablet TAKE 1/2 TABLET TWO TIMES DAILY 03/12/13  Yes Bradd Canary, MD  Multiple Vitamin (MULTIVITAMIN) tablet Take 1 tablet by mouth daily.     Yes Historical Provider, MD  omeprazole (PRILOSEC) 20 MG capsule TAKE 1 CAPSULE DAILY 02/24/13  Yes Bradd Canary, MD  simvastatin (ZOCOR) 5 MG tablet Take 1 tablet (5 mg total) by mouth at bedtime. 02/06/13  Yes Bradd Canary, MD  VITAMIN E COMPLEX PO Take 1 capsule by mouth daily. Patient unsure of exact dose   Yes Historical Provider, MD  cetirizine (ZYRTEC) 10 MG tablet Take 10 mg by mouth daily. 11/17/11 03/23/13  Bradd Canary, MD     Allergies:  Allergies  Allergen Reactions  . Bee Venom Anaphylaxis  . Lipitor [Atorvastatin]     Myalgias, memory changes  . Morphine     hyperactive    Social History:   reports that he quit smoking about 21 years ago. His smoking use included Cigarettes. He has a 30 pack-year smoking history. He has never used smokeless tobacco. He reports that he does not drink alcohol or use illicit drugs.  Family History: Family History  Problem Relation Age of Onset  .  Diabetes Mother   . Hypertension Mother   . Stroke Mother   . Hyperlipidemia Mother   . Hypertension Father   . Colon polyps Father   . Heart attack Father 44  . Stroke Father   . Heart attack Brother   . Diabetes Brother   . Heart disease Brother   . Heart attack Brother     Multiple  . Diabetes Brother   . Other Brother     heart problems  . Heart disease Brother   . Diabetes Sister   . Obesity Brother   . Diabetes Brother   . Hypertension Maternal Grandmother   . ADD / ADHD Daughter   . Stomach cancer Neg Hx      Physical Exam: Filed Vitals:   03/28/13 1810 03/28/13 1820 03/28/13 1830 03/28/13 1900  BP: 99/63 106/65 110/65 107/65  Pulse: 88 84 85  86  Temp:      TempSrc:      Resp: 17 13 16 14   SpO2: 99% 99% 100% 99%   Blood pressure 107/65, pulse 86, temperature 98 F (36.7 C), temperature source Oral, resp. rate 14, SpO2 99.00%.  GEN:  Pleasant  patient lying in the stretcher in no acute distress; cooperative with exam. PSYCH:  alert and oriented x4; does not appear anxious or depressed; affect is appropriate. HEENT: Mucous membranes pale and anicteric; PERRLA; EOM intact; no cervical lymphadenopathy nor thyromegaly or carotid bruit; no JVD; There were no stridor. Neck is very supple. Breasts:: Not examined CHEST WALL: No tenderness CHEST: Normal respiration, clear to auscultation bilaterally.  HEART: Regular rate and rhythm.  There are no murmur, rub, or gallops.   BACK: No kyphosis or scoliosis; no CVA tenderness ABDOMEN: soft and non-tender; no masses, no organomegaly, normal abdominal bowel sounds; no pannus; no intertriginous candida. There is no rebound and no distention. Rectal Exam: Not done EXTREMITIES: No bone or joint deformity; age-appropriate arthropathy of the hands and knees; no edema; no ulcerations.  There is no calf tenderness. Genitalia: not examined PULSES: 2+ and symmetric SKIN: Normal hydration no rash or ulceration CNS: Cranial nerves  2-12 grossly intact no focal lateralizing neurologic deficit.  Speech is fluent; uvula elevated with phonation, facial symmetry and tongue midline. DTR are normal bilaterally, cerebella exam is intact, barbinski is negative and strengths are equaled bilaterally.  No sensory loss.   Labs on Admission:  Basic Metabolic Panel:  Recent Labs Lab 03/23/13 1610 03/28/13 1810 03/28/13 1857  NA  --  134* 138  K  --  3.7 3.8  CL  --  100 100  CO2  --  23  --   GLUCOSE  --  267* 264*  BUN 15 27* 26*  CREATININE 0.9 0.88 1.00  CALCIUM  --  8.3*  --    Liver Function Tests:  Recent Labs Lab 03/23/13 1540 03/28/13 1810  AST 19 15  ALT 17 12  ALKPHOS 39 35*  BILITOT 0.6 0.3  PROT 6.7 5.3*  ALBUMIN 3.9 3.1*    Recent Labs Lab 03/23/13 1540  LIPASE 39.0   No results found for this basename: AMMONIA,  in the last 168 hours CBC:  Recent Labs Lab 03/23/13 1540 03/28/13 1810 03/28/13 1857  WBC 6.9 6.9  --   NEUTROABS 4.2 3.9  --   HGB 13.1 8.0* 7.8*  HCT 38.0* 23.0* 23.0*  MCV 86.9 87.1  --   PLT 222.0 297  --    Cardiac Enzymes: No results found for this basename: CKTOTAL, CKMB, CKMBINDEX, TROPONINI,  in the last 168 hours  CBG:  Recent Labs Lab 03/27/13 1343  GLUCAP 139*     Radiological Exams on Admission: No results found.  Assessment/Plan Present on Admission:  . Acute GI bleeding . Anemia . Morbid obesity . DIVERTICULOSIS, COLON . Diabetes type 2, controlled . CAD (coronary artery disease) . HTN (hypertension) . OSA (obstructive sleep apnea)  PLAN:   I am very concerned about an upper GI bleed, though it is still possible that he bled from his known diverticular disease.  He will need ICU admission, blood transfusions, temporary discontinuation of betablocker, IVF, and IV PPI.  Will proceed with the bleeding scan arranged by EDP tonight.  I will also hold his glucophage and made NPO.  Will use moderate SSI.  I have consulted surgery in case he will need  urgent surgical intervention.  He is stable hemodynamically, and will be admitted to [facility] service.  He is a full code.  Thank you for allowing me to participate in the care of this nice gentleman.  Other plans as per orders.  Code Status: FULL Unk Lightning, MD. [facility] Pager [phone] 7pm to 7am.  03/28/2013, 8:28 PM

## 2013-03-28 NOTE — Consult Note (Signed)
Reason for Consult:GI bleed Referring Physician: Dr. Houston Siren  Brent [name] is an 57 y.o. male.  HPI: He is status post laparoscopic gastric bypass in [place]. He began having some abdominal pain and underwent upper endoscopy yesterday which demonstrated normal bypass anatomy. His hemoglobin was 13 5 days ago.  He today with what he felt was low-grade fever and feeling weak. He had a presyncopal type episode. He presented to the emergency department and was noted to have profound anemia with a hemoglobin around 8 and some hypotension. He was resuscitated and felt a lot better. He did report a passing some dark and bright red blood with his bowel movements. He's had diarrhea since the gastric bypass. We were asked to see him because of concern over bleeding from the gastric remnant. He does have a history of diverticulosis as well.  Past Medical History  Diagnosis Date  . DM (diabetes mellitus), type 2, uncontrolled   . Acid reflux disease   . Sleep apnea     a. CPAP  . Hx of colonic polyps   . Diverticulosis of colon   . Hypertension 07/14/2010  . Morbid obesity 03/27/2010    a. 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  . 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 HYPERSOMNIA, ASSOCIATED WITH SLEEP APNEA 07/26/2008  . DIVERTICULOSIS, COLON 10/29/2008  . Diarrhea 06/13/2010  . COLONIC POLYPS, HX OF 10/29/2008  . Benign neoplasm of colon 07/05/2007  . ACID REFLUX DISEASE 07/05/2007  . Knee pain, left 10/10/2010  . Tear of meniscus of left knee 2012  . HTN (hypertension) 07/14/2010  . Breast pain, left 11/17/2011  . Preventative health care 11/17/2011  . Chest pain     a. Reportedly negative dobut echo performed prior to gastric bypass in 03/2011;  b. 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 12/2011 Cath: LM nl, LAD 50p, D1 75m, LCX min irregs, OM3 30, RCA 25p, 72m (FFR 0.99->0.89), PDA 30, EF 65%, Med Rx.  . Other and unspecified hyperlipidemia 11/16/2012  . Gout 03/04/2013    Past Surgical History   Procedure Laterality Date  . Ankle surgery  1994  . Tonsillectomy  age 77  . Colonoscopy polyps    . Knee arthroscopy  11/06/10    Left, torn meniscus (repaired)  . Colonoscopy    . Gastric bypass    . Hernia repair    . Upper gi endoscopy  03/27/13    Family History  Problem Relation Age of Onset  . Diabetes Mother   . Hypertension Mother   . Stroke Mother   . Hyperlipidemia Mother   . Hypertension Father   . Colon polyps Father   . Heart attack Father 68  . Stroke Father   . Heart attack Brother   . Diabetes Brother   . Heart disease Brother   . Heart attack Brother     Multiple  . Diabetes Brother   . Other Brother     heart problems  . Heart disease Brother   . Diabetes Sister   . Obesity Brother   . Diabetes Brother   . Hypertension Maternal Grandmother   . ADD / ADHD Daughter   . Stomach cancer Neg Hx     Social History:  reports that he quit smoking about 21 years ago. His smoking use included Cigarettes. He has a 30 pack-year smoking history. He has never used smokeless tobacco. He reports that he does not drink alcohol or use illicit drugs.  Allergies:  Allergies  Allergen Reactions  . Bee Venom Anaphylaxis  . Lipitor [Atorvastatin]     Myalgias, memory changes  . Morphine     hyperactive    Prior to Admission medications   Medication Sig Start Date End Date Taking? Authorizing Provider  aspirin 81 MG tablet Take 1 tablet (81 mg total) by mouth daily. 06/08/11  Yes Bradd Canary, MD  CALCIUM CITRATE PO Take 1,500 mg by mouth daily.   Yes Historical Provider, MD  Cholecalciferol (VITAMIN D-3) 5000 UNITS TABS Take 1 tablet by mouth daily.   Yes Historical Provider, MD  colchicine 0.6 MG tablet 1 tab every 2 hours til pain relief, diarrhea or max of 6 tabs in 24 hours 03/04/13  Yes Bradd Canary, MD  Cyanocobalamin (VITAMIN B-12) 1000 MCG SUBL Place 1 each under the tongue daily.   Yes Historical Provider, MD  fenofibrate 160 MG tablet Take 1 tablet  (160 mg total) by mouth daily. 02/06/13  Yes Bradd Canary, MD  fish oil-omega-3 fatty acids 1000 MG capsule Take 2,400 mg by mouth 2 (two) times daily.     Yes Historical Provider, MD  metFORMIN (GLUCOPHAGE) 500 MG tablet Take 500 mg by mouth 2 (two) times daily with a meal. 02/06/13  Yes Bradd Canary, MD  metoprolol tartrate (LOPRESSOR) 25 MG tablet TAKE 1/2 TABLET TWO TIMES DAILY 03/12/13  Yes Bradd Canary, MD  Multiple Vitamin (MULTIVITAMIN) tablet Take 1 tablet by mouth daily.     Yes Historical Provider, MD  omeprazole (PRILOSEC) 20 MG capsule TAKE 1 CAPSULE DAILY 02/24/13  Yes Bradd Canary, MD  simvastatin (ZOCOR) 5 MG tablet Take 1 tablet (5 mg total) by mouth at bedtime. 02/06/13  Yes Bradd Canary, MD  VITAMIN E COMPLEX PO Take 1 capsule by mouth daily. Patient unsure of exact dose   Yes Historical Provider, MD  cetirizine (ZYRTEC) 10 MG tablet Take 10 mg by mouth daily. 11/17/11 03/23/13  Bradd Canary, MD     Results for orders placed during the hospital encounter of 03/28/13 (from the past 48 hour(s))  OCCULT BLOOD, POC DEVICE     Status: Abnormal   Collection Time    03/28/13  6:09 PM      Result Value Range   Fecal Occult Bld POSITIVE (*) NEGATIVE  CBC WITH DIFFERENTIAL     Status: Abnormal   Collection Time    03/28/13  6:10 PM      Result Value Range   WBC 6.9  4.0 - 10.5 K/uL   RBC 2.64 (*) 4.22 - 5.81 MIL/uL   Hemoglobin 8.0 (*) 13.0 - 17.0 g/dL   HCT 16.1 (*) 09.6 - 04.5 %   MCV 87.1  78.0 - 100.0 fL   MCH 30.3  26.0 - 34.0 pg   MCHC 34.8  30.0 - 36.0 g/dL   RDW 40.9  81.1 - 91.4 %   Platelets 297  150 - 400 K/uL   Neutrophils Relative % 57  43 - 77 %   Neutro Abs 3.9  1.7 - 7.7 K/uL   Lymphocytes Relative 32  12 - 46 %   Lymphs Abs 2.2  0.7 - 4.0 K/uL   Monocytes Relative 9  3 - 12 %   Monocytes Absolute 0.6  0.1 - 1.0 K/uL   Eosinophils Relative 2  0 - 5 %   Eosinophils Absolute 0.1  0.0 - 0.7 K/uL   Basophils Relative 1  0 - 1 %  Basophils  Absolute 0.0  0.0 - 0.1 K/uL  COMPREHENSIVE METABOLIC PANEL     Status: Abnormal   Collection Time    03/28/13  6:10 PM      Result Value Range   Sodium 134 (*) 135 - 145 mEq/L   Potassium 3.7  3.5 - 5.1 mEq/L   Chloride 100  96 - 112 mEq/L   CO2 23  19 - 32 mEq/L   Glucose, Bld 267 (*) 70 - 99 mg/dL   BUN 27 (*) 6 - 23 mg/dL   Creatinine, Ser 7.25  0.50 - 1.35 mg/dL   Calcium 8.3 (*) 8.4 - 10.5 mg/dL   Total Protein 5.3 (*) 6.0 - 8.3 g/dL   Albumin 3.1 (*) 3.5 - 5.2 g/dL   AST 15  0 - 37 U/L   ALT 12  0 - 53 U/L   Alkaline Phosphatase 35 (*) 39 - 117 U/L   Total Bilirubin 0.3  0.3 - 1.2 mg/dL   GFR calc non Af Amer >90  >90 mL/min   GFR calc Af Amer >90  >90 mL/min   Comment: (NOTE)     The eGFR has been calculated using the CKD EPI equation.     This calculation has not been validated in all clinical situations.     eGFR's persistently <90 mL/min signify possible Chronic Kidney     Disease.  TYPE AND SCREEN     Status: None   Collection Time    03/28/13  6:10 PM      Result Value Range   ABO/RH(D) A POS     Antibody Screen NEG     Sample Expiration 03/31/2013    CG4 I-STAT (LACTIC ACID)     Status: Abnormal   Collection Time    03/28/13  6:57 PM      Result Value Range   Lactic Acid, Venous 3.02 (*) 0.5 - 2.2 mmol/L  POCT I-STAT, CHEM 8     Status: Abnormal   Collection Time    03/28/13  6:57 PM      Result Value Range   Sodium 138  135 - 145 mEq/L   Potassium 3.8  3.5 - 5.1 mEq/L   Chloride 100  96 - 112 mEq/L   BUN 26 (*) 6 - 23 mg/dL   Creatinine, Ser 3.66  0.50 - 1.35 mg/dL   Glucose, Bld 440 (*) 70 - 99 mg/dL   Calcium, Ion 3.47  4.25 - 1.23 mmol/L   TCO2 21  0 - 100 mmol/L   Hemoglobin 7.8 (*) 13.0 - 17.0 g/dL   HCT 95.6 (*) 38.7 - 56.4 %    No results found.  Review of Systems  Constitutional: Positive for fever.  Respiratory: Negative for cough.   Cardiovascular: Negative for chest pain.  Gastrointestinal: Positive for abdominal pain (mild in RLQ)  and diarrhea. Negative for nausea and vomiting.  Neurological: Positive for weakness.   Blood pressure 107/65, pulse 86, temperature 98 F (36.7 C), temperature source Oral, resp. rate 14, SpO2 99.00%. Physical Exam  Constitutional:  Overweight male in no acute distress. He is manipulating his cell phone. He is sitting in nuclear medicine at this time. He states he feels significantly better than he did when he first arrived.  HENT:  Head: Normocephalic and atraumatic.  Eyes: No scleral icterus.  Cardiovascular: Normal rate and regular rhythm.   GI: Soft. He exhibits no distension and no mass. There is tenderness (mild in RLQ).  Multiple small  scars.  Musculoskeletal:  Palpable pedal pulses. Feet are warm.  Skin: There is pallor.    Assessment/Plan: Acute gastrointestinal bleeding. Difficult to tell if its an upper or lower GI source. He is currently in nuclear medicine getting a bleeding scan.  Recommendation:  If the nuclear medicine scan demonstrates to bleeding seemed to be in what is felt to be the gastric remnant, would suggest angioembolization if the bleeding continues. If it suggests bleeding from the colon, would suggest resuscitation and consideration of colonoscopy.   Will follow with you. Harl Wiechmann J 03/28/2013, 9:56 PM

## 2013-03-29 ENCOUNTER — Encounter (HOSPITAL_COMMUNITY): Payer: Self-pay | Admitting: Internal Medicine

## 2013-03-29 DIAGNOSIS — D649 Anemia, unspecified: Secondary | ICD-10-CM

## 2013-03-29 DIAGNOSIS — G4733 Obstructive sleep apnea (adult) (pediatric): Secondary | ICD-10-CM

## 2013-03-29 DIAGNOSIS — R1013 Epigastric pain: Secondary | ICD-10-CM

## 2013-03-29 DIAGNOSIS — Z9884 Bariatric surgery status: Secondary | ICD-10-CM

## 2013-03-29 LAB — GLUCOSE, CAPILLARY
Glucose-Capillary: 133 mg/dL — ABNORMAL HIGH (ref 70–99)
Glucose-Capillary: 139 mg/dL — ABNORMAL HIGH (ref 70–99)
Glucose-Capillary: 160 mg/dL — ABNORMAL HIGH (ref 70–99)
Glucose-Capillary: 165 mg/dL — ABNORMAL HIGH (ref 70–99)
Glucose-Capillary: 173 mg/dL — ABNORMAL HIGH (ref 70–99)
Glucose-Capillary: 175 mg/dL — ABNORMAL HIGH (ref 70–99)
Glucose-Capillary: 200 mg/dL — ABNORMAL HIGH (ref 70–99)

## 2013-03-29 LAB — PROTIME-INR
INR: 1.13 (ref 0.00–1.49)
Prothrombin Time: 14.3 seconds (ref 11.6–15.2)

## 2013-03-29 LAB — PREPARE RBC (CROSSMATCH)

## 2013-03-29 LAB — HEMOGLOBIN AND HEMATOCRIT, BLOOD
HCT: 23.7 % — ABNORMAL LOW (ref 39.0–52.0)
HCT: 25.9 % — ABNORMAL LOW (ref 39.0–52.0)
Hemoglobin: 8.2 g/dL — ABNORMAL LOW (ref 13.0–17.0)
Hemoglobin: 8.7 g/dL — ABNORMAL LOW (ref 13.0–17.0)

## 2013-03-29 LAB — TROPONIN I
Troponin I: <0.3 ng/mL (ref <0.30)
Troponin I: <0.3 ng/mL (ref <0.30)
Troponin I: <0.3 ng/mL (ref <0.30)

## 2013-03-29 LAB — MRSA PCR SCREENING: MRSA by PCR: NEGATIVE

## 2013-03-29 MED ORDER — DEXTROSE-NACL 5-0.9 % IV SOLN
INTRAVENOUS | Status: DC
  Administered 2013-03-29 – 2013-03-31 (×3): via INTRAVENOUS
  Administered 2013-04-02: 1000 mL via INTRAVENOUS
  Administered 2013-04-03: 09:00:00 via INTRAVENOUS

## 2013-03-29 MED ORDER — ONDANSETRON HCL 4 MG/2ML IJ SOLN: 4.0000 mg | Freq: Four times a day (QID) | INTRAMUSCULAR | Status: DC | PRN

## 2013-03-29 MED ORDER — INSULIN ASPART 100 UNIT/ML ~~LOC~~ SOLN
0.0000 [IU] | SUBCUTANEOUS | Status: DC
  Administered 2013-03-29 (×2): 3 [IU] via SUBCUTANEOUS
  Administered 2013-03-29: 2 [IU] via SUBCUTANEOUS
  Administered 2013-03-29 (×3): 3 [IU] via SUBCUTANEOUS
  Administered 2013-03-30 (×3): 2 [IU] via SUBCUTANEOUS
  Administered 2013-03-30: 3 [IU] via SUBCUTANEOUS
  Administered 2013-03-30: 2 [IU] via SUBCUTANEOUS
  Administered 2013-03-30: 5 [IU] via SUBCUTANEOUS
  Administered 2013-03-31: 3 [IU] via SUBCUTANEOUS
  Administered 2013-03-31 (×2): 2 [IU] via SUBCUTANEOUS
  Administered 2013-03-31 – 2013-04-01 (×3): 3 [IU] via SUBCUTANEOUS
  Administered 2013-04-01 (×2): 2 [IU] via SUBCUTANEOUS
  Administered 2013-04-01: 3 [IU] via SUBCUTANEOUS
  Administered 2013-04-02 – 2013-04-03 (×3): 2 [IU] via SUBCUTANEOUS

## 2013-03-29 MED ORDER — SODIUM CHLORIDE 0.9 % IJ SOLN
3.0000 mL | Freq: Two times a day (BID) | INTRAMUSCULAR | Status: DC
  Administered 2013-03-29 – 2013-03-30 (×3): 3 mL via INTRAVENOUS

## 2013-03-29 MED ORDER — ONDANSETRON HCL 4 MG PO TABS: 4.0000 mg | ORAL_TABLET | Freq: Four times a day (QID) | ORAL | Status: DC | PRN

## 2013-03-29 NOTE — Progress Notes (Signed)
Upon arrival patient on home unit CPAP brought in from home by family member--states tolerates better/more comfortable. VSS, Spo2 97%. RT will assist as needed.

## 2013-03-29 NOTE — Consult Note (Signed)
Referring Provider: No ref. provider found Primary Care Physician:  Danise Edge, MD Primary Gastroenterologist:  Dr. Marina Goodell   Reason for Consultation:  GIB  HPI: KYE HEDDEN is a 57 y.o. male with past medical hx of DM2, CAD, sleep apnea on cpap, gout, s/p Roux en Y gastric bypass 2 years ago at [facility].  He has been complaining of two week history of abdominal pain.  CT scan of the abdomen and pelvis was negative on 12/8 was negative.  EGD on 12/8 was also normal with just post-surgical anatomic changes.  He presented to the [facility] ER on 12/9 with one episode of syncope, BRBPR with some melena at home.  He was found to have a drop of 5 grams in Hgb since it was checked 5 days prior.  BUN was elevated at 27.  He was heme positive.  He also had an episode of acute gouty attack recently for which he took some predisone and then colchicine.  He also takes two ASA 81 mg twice daily.  No other NSAID's.  He does take daily omeprazole at home.  His Hgb in the ER was 8 grams.  On-call GI was notified and recommended a nuclear bleeding scan and PPI gtt.  Bleeding scan was positive for the epigastric region.  He was not sent to IR for angio.  Surgery is following him as well.  His Hgb has been stable (8.2 grams this AM), but not improved after two units of PRBC's.  No complaints of abdominal pain currently.  No dizziness currently.  Just had another dark BM with clots.  No nausea or vomiting.   Past Medical History  Diagnosis Date  . DM (diabetes mellitus), type 2, uncontrolled   . Acid reflux disease   . Sleep apnea     a. CPAP  . Hx of colonic polyps   . Diverticulosis of colon   . Hypertension 07/14/2010  . Morbid obesity 03/27/2010    a. 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  . 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 HYPERSOMNIA, ASSOCIATED WITH SLEEP APNEA 07/26/2008  . DIVERTICULOSIS, COLON 10/29/2008  . Diarrhea 06/13/2010  . COLONIC POLYPS, HX OF 10/29/2008  . Benign neoplasm of colon 07/05/2007  . ACID REFLUX  DISEASE 07/05/2007  . Knee pain, left 10/10/2010  . Tear of meniscus of left knee 2012  . HTN (hypertension) 07/14/2010  . Breast pain, left 11/17/2011  . Preventative health care 11/17/2011  . Chest pain     a. Reportedly negative dobut echo performed prior to gastric bypass in 03/2011;  b. 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 12/2011 Cath: LM nl, LAD 50p, D1 64m, LCX min irregs, OM3 30, RCA 25p, 38m (FFR 0.99->0.89), PDA 30, EF 65%, Med Rx.  . Other and unspecified hyperlipidemia 11/16/2012  . Gout 03/04/2013    Past Surgical History  Procedure Laterality Date  . Ankle surgery  1994  . Tonsillectomy  age 50  . Colonoscopy polyps    . Knee arthroscopy  11/06/10    Left, torn meniscus (repaired)  . Colonoscopy    . Gastric bypass    . Hernia repair    . Upper gi endoscopy  03/27/13    Prior to Admission medications   Medication Sig Start Date End Date Taking? Authorizing Provider  aspirin 81 MG tablet Take 1 tablet (81 mg total) by mouth daily. 06/08/11  Yes Bradd Canary, MD  CALCIUM CITRATE PO Take 1,500 mg by mouth daily.  Yes Historical Provider, MD  Cholecalciferol (VITAMIN D-3) 5000 UNITS TABS Take 1 tablet by mouth daily.   Yes Historical Provider, MD  colchicine 0.6 MG tablet 1 tab every 2 hours til pain relief, diarrhea or max of 6 tabs in 24 hours 03/04/13  Yes Bradd Canary, MD  Cyanocobalamin (VITAMIN B-12) 1000 MCG SUBL Place 1 each under the tongue daily.   Yes Historical Provider, MD  fenofibrate 160 MG tablet Take 1 tablet (160 mg total) by mouth daily. 02/06/13  Yes Bradd Canary, MD  fish oil-omega-3 fatty acids 1000 MG capsule Take 2,400 mg by mouth 2 (two) times daily.     Yes Historical Provider, MD  metFORMIN (GLUCOPHAGE) 500 MG tablet Take 500 mg by mouth 2 (two) times daily with a meal. 02/06/13  Yes Bradd Canary, MD  metoprolol tartrate (LOPRESSOR) 25 MG tablet TAKE 1/2 TABLET TWO TIMES DAILY 03/12/13  Yes Bradd Canary, MD  Multiple Vitamin (MULTIVITAMIN)  tablet Take 1 tablet by mouth daily.     Yes Historical Provider, MD  omeprazole (PRILOSEC) 20 MG capsule TAKE 1 CAPSULE DAILY 02/24/13  Yes Bradd Canary, MD  simvastatin (ZOCOR) 5 MG tablet Take 1 tablet (5 mg total) by mouth at bedtime. 02/06/13  Yes Bradd Canary, MD  VITAMIN E COMPLEX PO Take 1 capsule by mouth daily. Patient unsure of exact dose   Yes Historical Provider, MD  cetirizine (ZYRTEC) 10 MG tablet Take 10 mg by mouth daily. 11/17/11 03/23/13  Bradd Canary, MD    Current Facility-Administered Medications  Medication Dose Route Frequency Provider Last Rate Last Dose  . dextrose 5 %-0.9 % sodium chloride infusion   Intravenous Continuous Houston Siren, MD 100 mL/hr at 03/29/13 0551    . insulin aspart (novoLOG) injection 0-15 Units  0-15 Units Subcutaneous Q4H Houston Siren, MD   3 Units at 03/29/13 0820  . ondansetron (ZOFRAN) tablet 4 mg  4 mg Oral Q6H PRN Houston Siren, MD       Or  . ondansetron [facility]) injection 4 mg  4 mg Intravenous Q6H PRN Houston Siren, MD      . pantoprazole (PROTONIX) 80 mg in sodium chloride 0.9 % 250 mL infusion  8 mg/hr Intravenous Continuous Shon Baton, MD 25 mL/hr at 03/29/13 0112 8 mg/hr at 03/29/13 0112  . sodium chloride 0.9 % injection 3 mL  3 mL Intravenous Q12H Houston Siren, MD   3 mL at 03/29/13 4540    Allergies as of 03/28/2013 - Review Complete 03/28/2013  Allergen Reaction Noted  . Bee venom Anaphylaxis 09/10/2010  . Lipitor [atorvastatin]  01/12/2012  . Morphine      Family History  Problem Relation Age of Onset  . Diabetes Mother   . Hypertension Mother   . Stroke Mother   . Hyperlipidemia Mother   . Hypertension Father   . Colon polyps Father   . Heart attack Father 36  . Stroke Father   . Heart attack Brother   . Diabetes Brother   . Heart disease Brother   . Heart attack Brother     Multiple  . Diabetes Brother   . Other Brother     heart problems  . Heart disease Brother   . Diabetes Sister   . Obesity Brother   . Diabetes  Brother   . Hypertension Maternal Grandmother   . ADD / ADHD Daughter   . Stomach cancer Neg Hx     History  Social History  . Marital Status: Married    Spouse Name: N/A    Number of Children: 2  . Years of Education: N/A   Occupational History  . TEACHER    Social History Main Topics  . Smoking status: Former Smoker -- 1.50 packs/day for 20 years    Types: Cigarettes    Quit date: 04/21/1991  . Smokeless tobacco: Never Used  . Alcohol Use: No     Comment:  occasionaly- social  . Drug Use: No  . Sexual Activity: Yes   Other Topics Concern  . Not on file   Social History Narrative   Lives with wife in [place].  Does not routinely exercise.    Review of Systems: Ten point ROS is O/W negative except as mentioned in HPI.  Physical Exam: Vital signs in last 24 hours: Temp:  [98 F (36.7 C)-99.6 F (37.6 C)] 99.2 F (37.3 C) (12/10 0800) Pulse Rate:  [77-114] 83 (12/10 0800) Resp:  [13-20] 14 (12/10 0800) BP: (68-121)/(38-87) 101/59 mmHg (12/10 0800) SpO2:  [97 %-100 %] 97 % (12/10 0800) Weight:  [248 lb 3.8 oz (112.6 kg)] 248 lb 3.8 oz (112.6 kg) (12/10 0030) Last BM Date: 03/28/13 General:  Alert, Well-developed, well-nourished, pleasant and cooperative in NAD Head:  Normocephalic and atraumatic. Eyes:  Sclera clear, no icterus.  Conjunctiva pink. Ears:  Normal auditory acuity. Mouth:  No deformity or lesions.   Lungs:  Clear throughout to auscultation.  No wheezes, crackles, or rhonchi.  Heart:  Regular rate and rhythm; no murmurs, clicks, rubs,  or gallops. Abdomen:  Soft, non-distended.  BS present.  Non-tender.   Rectal:  Deferred.  Heme positive in the ED.  Msk:  Symmetrical without gross deformities. Pulses:  Normal pulses noted. Extremities:  Without clubbing or edema. Neurologic:  Alert and  oriented x4;  grossly normal neurologically. Skin:  Intact without significant lesions or rashes. Psych:  Alert and cooperative. Normal mood and  affect.  Intake/Output from previous day: 12/09 0701 - 12/10 0700 In: 1525 [I.V.:875; Blood:650] Out: 800 [Urine:800]  Lab Results:  Recent Labs  03/28/13 1810 03/28/13 1857 03/29/13 0734  WBC 6.9  --   --   HGB 8.0* 7.8* 8.2*  HCT 23.0* 23.0* 23.7*  PLT 297  --   --    BMET  Recent Labs  03/28/13 1810 03/28/13 1857  NA 134* 138  K 3.7 3.8  CL 100 100  CO2 23  --   GLUCOSE 267* 264*  BUN 27* 26*  CREATININE 0.88 1.00  CALCIUM 8.3*  --    LFT  Recent Labs  03/28/13 1810  PROT 5.3*  ALBUMIN 3.1*  AST 15  ALT 12  ALKPHOS 35*  BILITOT 0.3   PT/INR  Recent Labs  03/29/13 0734  LABPROT 14.3  INR 1.13   Studies/Results: Nm Gi Blood Loss  03/29/2013   CLINICAL DATA:  Black tarry stools. Syncopal episode. Hemoglobin is 8.0. History of gastric bypass surgery  EXAM: NUCLEAR MEDICINE GASTROINTESTINAL BLEEDING SCAN  TECHNIQUE: Sequential abdominal images were obtained following intravenous administration of Tc-41m labeled red blood cells.  COMPARISON:  CT of the abdomen and pelvis on 03/24/2013  RADIOPHARMACEUTICALS:  21. Tc-27m in-vitro labeled red cells.  FINDINGS: On the 1 hour images, there is a small amount of activity seen within the epigastric region, seen to migrate inferiorly. On the 2 hr images, the activity crosses midline. Findings are favored to be related to the region of surgery, either within  the gastric remnant and duodenum, or within the gastric jejunostomy based on comparison with the CT exam.  IMPRESSION: 1. This study is positive. 2. The most likely source of bleeding is felt to be within the epigastric region, either within the stomach/duodenum or within the gastrojejunostomy. 3. The findings were discussed with COURTNEY, HORTON on 03/29/2013 at 12:19 AM.   Electronically Signed   By: Rosalie Gums M.D.   On: 03/29/2013 00:20    IMPRESSION:  -UGIB:  With recent history of abdominal pain and no identifiable source on EGD or CT scan, suspect  possible ulcer in gastric remnant, non-visualized small bowel, or anastomosis.  NM bleeding scan showed source in upper abdomen, but he was not sent to IR for angio. -ABLA:  Hgb down 5 grams from 5 days prior (8.0 grams on admission).  S/p transfusion with two units PRBC's. Hgb is stable this AM (but not improved) at 8.2 grams. -Roux-en-Y gastric bypass 2 years ago.  Surgery is following as well.   PLAN: -Continue PPI gtt. -Monitor Hgb closely and transfuse further if needed.   -Hopefully bleeding will resolve with empiric treatment of ulcer disease, but will need to go to IR for angio and possible embolization if he continues to bleed.   ZEHR, JESSICA D.  03/29/2013, 9:09 AM  Pager number [phone]  GI ATTENDING  History, laboratories, recent x-ray report reviewed. Patient personally seen and examined. Family in room. Agree with assessment, plan, history and physical as above. Patient well known to me. Recently evaluated for abdominal pain. Negative CT scan and negative EGD post laparoscopic Roux-en-Y gastric bypass, 2 years ago. Now presents with acute upper GI bleed. Possibilities include gastric ulcer, duodenal ulcer, anastomotic ulcer. Other less common exotic causes as well. Bleeding has been significant. Currently stable. Agree with IV PPI. Transfusion as needed. Appreciate surgical help. If bleeding does not stop for significant recurrent bleeding occurs, then plans for angiogram with possible embolization. Patient and family aware.  Wilhemina Bonito. Eda Keys., M.D. [facility] Division of Gastroenterology

## 2013-03-29 NOTE — Progress Notes (Signed)
Inpatient Diabetes Program Recommendations  AACE/ADA: New Consensus Statement on Inpatient Glycemic Control (2013)  Target Ranges:  Prepandial:   less than 140 mg/dL      Peak postprandial:   less than 180 mg/dL (1-2 hours)      Critically ill patients:  140 - 180 mg/dL   Reason for Visit: Results for ARVIL, UTZ (MRN [id number]) as of 03/29/2013 13:58  Ref. Range 03/29/2013 01:03 03/29/2013 03:57 03/29/2013 07:42 03/29/2013 11:46  Glucose-Capillary Latest Range: 70-99 mg/dL 696 (H) 295 (H) 284 (H) 200 (H)   CBG's greater than goal.  If CBG's remain greater than 180 mg/dL, may consider adding Lantus 15 units daily as basal insulin.  Thanks, Beryl Meager, RN, BC-ADM Inpatient Diabetes Coordinator Pager [phone]

## 2013-03-29 NOTE — Progress Notes (Signed)
[facility] PROGRESS NOTE  Brent [name] ZOX:[id number] DOB: 05/17/1955 DOA: 03/28/2013 PCP: Danise Edge, MD  Assessment/Plan: . Acute GI bleeding - S/P EGD on Monday 12/8 per Dr. Marina Goodell  -Hemoglobin improved-8.2 this a.m. status post transfusion of 2 units of packed red blood cells last PM -Continue PPI -Plan is for angio/ for possible embolization if he rebleeds  -Appreciate surgery assistance, await GI input  . Anemia -As above  . Diabetes type 2, controlled -monitor Accu-Cheks and cover with sliding scale insulin  . CAD (coronary artery disease) -He is chest pain-free  . HTN (hypertension) -BP stable, Monitor blood pressures and further treat accordingly  . OSA (obstructive sleep apnea) .h/o DIVERTICULOSIS, COLON  Code Status: full Family Communication: wife at bedside Disposition Plan: monitor in step down today   Consultants:  GI  Surgery    Procedures:  none  Antibiotics:  none  HPI/Subjective: Pt denies any further BMs/rectal bleeding overnight.+ Right-sided abdominal pain. Denies nausea vomiting.  Objective: Filed Vitals:   03/29/13 0800  BP: 101/59  Pulse: 83  Temp:   Resp: 14    Intake/Output Summary (Last 24 hours) at 03/29/13 0853 Last data filed at 03/29/13 0700  Gross per 24 hour  Intake   1525 ml  Output    800 ml  Net    725 ml   Filed Weights   03/29/13 0030  Weight: 112.6 kg (248 lb 3.8 oz)    Exam:  General: alert & oriented x 3 In NAD Cardiovascular: RRR, nl S1 s2 Respiratory: CTAB Abdomen: soft +BS mild R. Sided tenderness/ND, no masses palpable Extremities: No cyanosis and no edema    Data Reviewed: Basic Metabolic Panel:  Recent Labs Lab 03/23/13 1610 03/28/13 1810 03/28/13 1857  NA  --  134* 138  K  --  3.7 3.8  CL  --  100 100  CO2  --  23  --   GLUCOSE  --  267* 264*  BUN 15 27* 26*  CREATININE 0.9 0.88 1.00  CALCIUM  --  8.3*  --    Liver Function Tests:  Recent Labs Lab 03/23/13 1540  03/28/13 1810  AST 19 15  ALT 17 12  ALKPHOS 39 35*  BILITOT 0.6 0.3  PROT 6.7 5.3*  ALBUMIN 3.9 3.1*    Recent Labs Lab 03/23/13 1540  LIPASE 39.0   No results found for this basename: AMMONIA,  in the last 168 hours CBC:  Recent Labs Lab 03/23/13 1540 03/28/13 1810 03/28/13 1857 03/29/13 0734  WBC 6.9 6.9  --   --   NEUTROABS 4.2 3.9  --   --   HGB 13.1 8.0* 7.8* 8.2*  HCT 38.0* 23.0* 23.0* 23.7*  MCV 86.9 87.1  --   --   PLT 222.0 297  --   --    Cardiac Enzymes:  Recent Labs Lab 03/29/13 0143 03/29/13 0734  TROPONINI <0.30 <0.30   BNP (last 3 results) No results found for this basename: PROBNP,  in the last 8760 hours CBG:  Recent Labs Lab 03/27/13 1343 03/29/13 0103 03/29/13 0357 03/29/13 0742  GLUCAP 139* 175* 139* 173*    Recent Results (from the past 240 hour(s))  MRSA PCR SCREENING     Status: None   Collection Time    03/29/13 12:41 AM      Result Value Range Status   MRSA by PCR NEGATIVE  NEGATIVE Final   Comment:  The GeneXpert MRSA Assay (FDA     approved for NASAL specimens     only), is one component of a     comprehensive MRSA colonization     surveillance program. It is not     intended to diagnose MRSA     infection nor to guide or     monitor treatment for     MRSA infections.     Studies: Nm Gi Blood Loss  03/29/2013   CLINICAL DATA:  Black tarry stools. Syncopal episode. Hemoglobin is 8.0. History of gastric bypass surgery  EXAM: NUCLEAR MEDICINE GASTROINTESTINAL BLEEDING SCAN  TECHNIQUE: Sequential abdominal images were obtained following intravenous administration of Tc-11m labeled red blood cells.  COMPARISON:  CT of the abdomen and pelvis on 03/24/2013  RADIOPHARMACEUTICALS:  21. Tc-9m in-vitro labeled red cells.  FINDINGS: On the 1 hour images, there is a small amount of activity seen within the epigastric region, seen to migrate inferiorly. On the 2 hr images, the activity crosses midline. Findings are  favored to be related to the region of surgery, either within the gastric remnant and duodenum, or within the gastric jejunostomy based on comparison with the CT exam.  IMPRESSION: 1. This study is positive. 2. The most likely source of bleeding is felt to be within the epigastric region, either within the stomach/duodenum or within the gastrojejunostomy. 3. The findings were discussed with COURTNEY, HORTON on 03/29/2013 at 12:19 AM.   Electronically Signed   By: Rosalie Gums M.D.   On: 03/29/2013 00:20    Scheduled Meds: . insulin aspart  0-15 Units Subcutaneous Q4H  . sodium chloride  3 mL Intravenous Q12H   Continuous Infusions: . dextrose 5 % and 0.9% NaCl 100 mL/hr at 03/29/13 0551  . pantoprozole (PROTONIX) infusion 8 mg/hr (03/29/13 0112)    Principal Problem:   Acute GI bleeding Active Problems:   Diabetes type 2, controlled   Morbid obesity   DIVERTICULOSIS, COLON   HTN (hypertension)   CAD (coronary artery disease)   OSA (obstructive sleep apnea)   Anemia    Time spent: 35    Manpreet Strey C  [facility] Pager [phone]. If 7PM-7AM, please contact night-coverage at [email or web address], password [facility] 03/29/2013, 8:53 AM  LOS: 1 day

## 2013-03-29 NOTE — Progress Notes (Signed)
CARE MANAGEMENT NOTE 03/29/2013  Patient:  Brent [name], Brent [name]   Account Number:  [id number]  Date Initiated:  03/29/2013  Documentation initiated by:  Tyerra Loretto  Subjective/Objective Assessment:   pt recently had gastric bypass now with falling hgb nuclear scan confirmed gi bld     Action/Plan:   home when stable   Anticipated DC Date:  04/01/2013   Anticipated DC Plan:  HOME/SELF CARE  In-house referral  NA      DC Planning Services  NA      [facility] Choice  NA   Choice offered to / List presented to:  NA   DME arranged  NA      DME agency  NA     HH arranged  NA      HH agency  NA   Status of service:  In process, will continue to follow Medicare Important Message given?  NA - LOS <3 / Initial given by admissions (If response is "NO", the following Medicare IM given date fields will be blank) Date Medicare IM given:   Date Additional Medicare IM given:    Discharge Disposition:    Per UR Regulation:  Reviewed for med. necessity/level of care/duration of stay  If discussed at Long Length of Stay Meetings, dates discussed:    Comments:  [id number]/Verlinda Slotnick Stark Jock, BSN, [place] [phone] Chart Reviewed for discharge and hospital needs. Discharge needs at time of review:  None present will follow for needs. Review of patient progress due on [id number].

## 2013-03-29 NOTE — Progress Notes (Signed)
05:00. ADDENDUM:   The nuclear scan was positive in the epigastric area, and Dr Abbey Chatters was made aware last night.  Patient has been stable, and was receiving blood.   Decision was made to see if he stops bleeding to continue with IV PPI, along with transfusions.  We will reassess in the am.  I have also spoken with Dr Frances Maywood of IR, and Dr Christella Hartigan this morning as well.  If he continues or having more active bleeding, will proceed with angio-embolization.

## 2013-03-29 NOTE — Progress Notes (Signed)
Patient ID: Brent [name], male   DOB: 1956-01-28, 57 y.o.   MRN: [id number]    Subjective:No BMs overnight. No pain or other C/O this AM  Objective: Vital signs in last 24 hours: Temp:  [98 F (36.7 C)-99.6 F (37.6 C)] 98.5 F (36.9 C) (12/10 0548) Pulse Rate:  [77-114] 77 (12/10 0700) Resp:  [13-20] 14 (12/10 0700) BP: (68-121)/(38-87) 97/67 mmHg (12/10 0548) SpO2:  [97 %-100 %] 98 % (12/10 0700) Weight:  [248 lb 3.8 oz (112.6 kg)] 248 lb 3.8 oz (112.6 kg) (12/10 0030) Last BM Date: 03/28/13  Intake/Output from previous day: 12/09 0701 - 12/10 0700 In: 1525 [I.V.:875; Blood:650] Out: 800 [Urine:800] Intake/Output this shift:    General appearance: alert, cooperative and no distress GI: normal findings: soft, non-tender and non distended  Lab Results:   Recent Labs  03/28/13 1810 03/28/13 1857 03/29/13 0734  WBC 6.9  --   --   HGB 8.0* 7.8* 8.2*  HCT 23.0* 23.0* 23.7*  PLT 297  --   --    BMET  Recent Labs  03/28/13 1810 03/28/13 1857  NA 134* 138  K 3.7 3.8  CL 100 100  CO2 23  --   GLUCOSE 267* 264*  BUN 27* 26*  CREATININE 0.88 1.00  CALCIUM 8.3*  --      Studies/Results: Nm Gi Blood Loss  03/29/2013   CLINICAL DATA:  Black tarry stools. Syncopal episode. Hemoglobin is 8.0. History of gastric bypass surgery  EXAM: NUCLEAR MEDICINE GASTROINTESTINAL BLEEDING SCAN  TECHNIQUE: Sequential abdominal images were obtained following intravenous administration of Tc-66m labeled red blood cells.  COMPARISON:  CT of the abdomen and pelvis on 03/24/2013  RADIOPHARMACEUTICALS:  21. Tc-48m in-vitro labeled red cells.  FINDINGS: On the 1 hour images, there is a small amount of activity seen within the epigastric region, seen to migrate inferiorly. On the 2 hr images, the activity crosses midline. Findings are favored to be related to the region of surgery, either within the gastric remnant and duodenum, or within the gastric jejunostomy based on comparison with the  CT exam.  IMPRESSION: 1. This study is positive. 2. The most likely source of bleeding is felt to be within the epigastric region, either within the stomach/duodenum or within the gastrojejunostomy. 3. The findings were discussed with COURTNEY, HORTON on 03/29/2013 at 12:19 AM.   Electronically Signed   By: Rosalie Gums M.D.   On: 03/29/2013 00:20    Anti-infectives: Anti-infectives   None      Assessment/Plan: GI bleed.  Hx of RYGB 2 years ago.  Neg EGD and CT.  NM scan last night showed source in upper abd.  Stable overnight,  Hbg stable.   Possible source gastric remnant ulcer, small bowel or transverse colon Cont PPI and observation.  Angio if evidence of re bleeding    LOS: 1 day    Holton Sidman T 03/29/2013

## 2013-03-29 NOTE — Consult Note (Signed)
HPI: Brent [name] is an 57 y.o. male admitted with GI bleed and dizziness. Hx of Roux en Y bypass 2 years ago.  Takes 2 ASA 81mg  daily, then started on Prednisone and colchicine for gout flare. Abd pain and GI bleed then followed. Has had EGD with no cause of bleed seen. He has now had NM RBC scan sowing evidence of activity in the epigastric region. Has had 2 units of PRBCs. Hgb up to 8.2 Pt denies any further bloody BM, hematemesis, pain. Chart, PMHX, meds reviewed.  Past Medical History:  Past Medical History  Diagnosis Date  . DM (diabetes mellitus), type 2, uncontrolled   . Acid reflux disease   . Sleep apnea     a. CPAP  . Hx of colonic polyps   . Diverticulosis of colon   . Hypertension 07/14/2010  . Morbid obesity 03/27/2010    a. 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  . 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 HYPERSOMNIA, ASSOCIATED WITH SLEEP APNEA 07/26/2008  . DIVERTICULOSIS, COLON 10/29/2008  . Diarrhea 06/13/2010  . COLONIC POLYPS, HX OF 10/29/2008  . Benign neoplasm of colon 07/05/2007  . ACID REFLUX DISEASE 07/05/2007  . Knee pain, left 10/10/2010  . Tear of meniscus of left knee 2012  . HTN (hypertension) 07/14/2010  . Breast pain, left 11/17/2011  . Preventative health care 11/17/2011  . Chest pain     a. Reportedly negative dobut echo performed prior to gastric bypass in 03/2011;  b. 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 12/2011 Cath: LM nl, LAD 50p, D1 24m, LCX min irregs, OM3 30, RCA 25p, 54m (FFR 0.99->0.89), PDA 30, EF 65%, Med Rx.  . Other and unspecified hyperlipidemia 11/16/2012  . Gout 03/04/2013    Past Surgical History:  Past Surgical History  Procedure Laterality Date  . Ankle surgery  1994  . Tonsillectomy  age 18  . Colonoscopy polyps    . Knee arthroscopy  11/06/10    Left, torn meniscus (repaired)  . Colonoscopy    . Gastric bypass    . Hernia repair    . Upper gi endoscopy  03/27/13  . Esophagogastroduodenoscopy N/A 03/27/2013    Procedure:  ESOPHAGOGASTRODUODENOSCOPY (EGD);  Surgeon: Hilarie Fredrickson, MD;  Location: [facility];  Service: Endoscopy;  Laterality: N/A;    Family History:  Family History  Problem Relation Age of Onset  . Diabetes Mother   . Hypertension Mother   . Stroke Mother   . Hyperlipidemia Mother   . Hypertension Father   . Colon polyps Father   . Heart attack Father 74  . Stroke Father   . Heart attack Brother   . Diabetes Brother   . Heart disease Brother   . Heart attack Brother     Multiple  . Diabetes Brother   . Other Brother     heart problems  . Heart disease Brother   . Diabetes Sister   . Obesity Brother   . Diabetes Brother   . Hypertension Maternal Grandmother   . ADD / ADHD Daughter   . Stomach cancer Neg Hx     Social History:  reports that he quit smoking about 21 years ago. His smoking use included Cigarettes. He has a 30 pack-year smoking history. He has never used smokeless tobacco. He reports that he does not drink alcohol or use illicit drugs.  Allergies:  Allergies  Allergen Reactions  . Bee Venom Anaphylaxis  . Lipitor [Atorvastatin]  Myalgias, memory changes  . Morphine     hyperactive    Medications: Current facility-administered medications:dextrose 5 %-0.9 % sodium chloride infusion, , Intravenous, Continuous, Houston Siren, MD, Last Rate: 100 mL/hr at 03/29/13 0551;  insulin aspart (novoLOG) injection 0-15 Units, 0-15 Units, Subcutaneous, Q4H, Houston Siren, MD, 3 Units at 03/29/13 0820;  ondansetron [facility]) injection 4 mg, 4 mg, Intravenous, Q6H PRN, Houston Siren, MD;  ondansetron [facility]) tablet 4 mg, 4 mg, Oral, Q6H PRN, Houston Siren, MD pantoprazole (PROTONIX) 80 mg in sodium chloride 0.9 % 250 mL infusion, 8 mg/hr, Intravenous, Continuous, Shon Baton, MD, Last Rate: 25 mL/hr at 03/29/13 0112, 8 mg/hr at 03/29/13 0112;  sodium chloride 0.9 % injection 3 mL, 3 mL, Intravenous, Q12H, Houston Siren, MD, 3 mL at 03/29/13 1610  Please HPI for pertinent positives, otherwise  complete 10 system ROS negative.  Physical Exam: BP 101/59  Pulse 83  Temp(Src) 99.2 F (37.3 C) (Axillary)  Resp 14  Ht 5\' 7"  (1.702 m)  Wt 248 lb 3.8 oz (112.6 kg)  BMI 38.87 kg/m2  SpO2 97% Body mass index is 38.87 kg/(m^2).   General Appearance:  Alert, cooperative, no distress, appears stated age  Head:  Normocephalic, without obvious abnormality, atraumatic  ENT: Unremarkable  Neck: Supple, symmetrical, trachea midline  Lungs:   Clear to auscultation bilaterally, no w/r/r  Chest Wall:  No tenderness or deformity  Heart:  Regular rate and rhythm, S1, S2 normal, no murmur, rub or gallop.  Abdomen:   Soft, non-tender, non distended.  Extremities: Extremities normal, atraumatic, no cyanosis or edema  Pulses: 2+ and symmetric femoral  Neurologic: Normal affect, no gross deficits.   Results for orders placed during the hospital encounter of 03/28/13 (from the past 48 hour(s))  OCCULT BLOOD, POC DEVICE     Status: Abnormal   Collection Time    03/28/13  6:09 PM      Result Value Range   Fecal Occult Bld POSITIVE (*) NEGATIVE  CBC WITH DIFFERENTIAL     Status: Abnormal   Collection Time    03/28/13  6:10 PM      Result Value Range   WBC 6.9  4.0 - 10.5 K/uL   RBC 2.64 (*) 4.22 - 5.81 MIL/uL   Hemoglobin 8.0 (*) 13.0 - 17.0 g/dL   HCT 96.0 (*) 45.4 - 09.8 %   MCV 87.1  78.0 - 100.0 fL   MCH 30.3  26.0 - 34.0 pg   MCHC 34.8  30.0 - 36.0 g/dL   RDW 11.9  14.7 - 82.9 %   Platelets 297  150 - 400 K/uL   Neutrophils Relative % 57  43 - 77 %   Neutro Abs 3.9  1.7 - 7.7 K/uL   Lymphocytes Relative 32  12 - 46 %   Lymphs Abs 2.2  0.7 - 4.0 K/uL   Monocytes Relative 9  3 - 12 %   Monocytes Absolute 0.6  0.1 - 1.0 K/uL   Eosinophils Relative 2  0 - 5 %   Eosinophils Absolute 0.1  0.0 - 0.7 K/uL   Basophils Relative 1  0 - 1 %   Basophils Absolute 0.0  0.0 - 0.1 K/uL  COMPREHENSIVE METABOLIC PANEL     Status: Abnormal   Collection Time    03/28/13  6:10 PM      Result  Value Range   Sodium 134 (*) 135 - 145 mEq/L   Potassium 3.7  3.5 - 5.1 mEq/L  Chloride 100  96 - 112 mEq/L   CO2 23  19 - 32 mEq/L   Glucose, Bld 267 (*) 70 - 99 mg/dL   BUN 27 (*) 6 - 23 mg/dL   Creatinine, Ser 1.61  0.50 - 1.35 mg/dL   Calcium 8.3 (*) 8.4 - 10.5 mg/dL   Total Protein 5.3 (*) 6.0 - 8.3 g/dL   Albumin 3.1 (*) 3.5 - 5.2 g/dL   AST 15  0 - 37 U/L   ALT 12  0 - 53 U/L   Alkaline Phosphatase 35 (*) 39 - 117 U/L   Total Bilirubin 0.3  0.3 - 1.2 mg/dL   GFR calc non Af Amer >90  >90 mL/min   GFR calc Af Amer >90  >90 mL/min   Comment: (NOTE)     The eGFR has been calculated using the CKD EPI equation.     This calculation has not been validated in all clinical situations.     eGFR's persistently <90 mL/min signify possible Chronic Kidney     Disease.  TYPE AND SCREEN     Status: None   Collection Time    03/28/13  6:10 PM      Result Value Range   ABO/RH(D) A POS     Antibody Screen NEG     Sample Expiration 03/31/2013     Unit Number [id number]     Blood Component Type RED CELLS,LR     Unit division 00     Status of Unit ISSUED     Transfusion Status OK TO TRANSFUSE     Crossmatch Result Compatible     Unit Number [id number]     Blood Component Type RED CELLS,LR     Unit division 00     Status of Unit ISSUED     Transfusion Status OK TO TRANSFUSE     Crossmatch Result Compatible    ABO/RH     Status: None   Collection Time    03/28/13  6:10 PM      Result Value Range   ABO/RH(D) A POS    CG4 I-STAT (LACTIC ACID)     Status: Abnormal   Collection Time    03/28/13  6:57 PM      Result Value Range   Lactic Acid, Venous 3.02 (*) 0.5 - 2.2 mmol/L  POCT I-STAT, CHEM 8     Status: Abnormal   Collection Time    03/28/13  6:57 PM      Result Value Range   Sodium 138  135 - 145 mEq/L   Potassium 3.8  3.5 - 5.1 mEq/L   Chloride 100  96 - 112 mEq/L   BUN 26 (*) 6 - 23 mg/dL   Creatinine, Ser 6.57  0.50 - 1.35 mg/dL   Glucose, Bld 846 (*) 70 - 99  mg/dL   Calcium, Ion 9.62  9.52 - 1.23 mmol/L   TCO2 21  0 - 100 mmol/L   Hemoglobin 7.8 (*) 13.0 - 17.0 g/dL   HCT 84.1 (*) 32.4 - 40.1 %  MRSA PCR SCREENING     Status: None   Collection Time    03/29/13 12:41 AM      Result Value Range   MRSA by PCR NEGATIVE  NEGATIVE   Comment:            The GeneXpert MRSA Assay (FDA     approved for NASAL specimens     only), is one component of a  comprehensive MRSA colonization     surveillance program. It is not     intended to diagnose MRSA     infection nor to guide or     monitor treatment for     MRSA infections.  PREPARE RBC (CROSSMATCH)     Status: None   Collection Time    03/29/13  1:00 AM      Result Value Range   Order Confirmation ORDER PROCESSED BY BLOOD BANK    GLUCOSE, CAPILLARY     Status: Abnormal   Collection Time    03/29/13  1:03 AM      Result Value Range   Glucose-Capillary 175 (*) 70 - 99 mg/dL  TROPONIN I     Status: None   Collection Time    03/29/13  1:43 AM      Result Value Range   Troponin I <0.30  <0.30 ng/mL   Comment:            Due to the release kinetics of cTnI,     a negative result within the first hours     of the onset of symptoms does not rule out     myocardial infarction with certainty.     If myocardial infarction is still suspected,     repeat the test at appropriate intervals.  GLUCOSE, CAPILLARY     Status: Abnormal   Collection Time    03/29/13  3:57 AM      Result Value Range   Glucose-Capillary 139 (*) 70 - 99 mg/dL   Comment 1 Notify RN    TROPONIN I     Status: None   Collection Time    03/29/13  7:34 AM      Result Value Range   Troponin I <0.30  <0.30 ng/mL   Comment:            Due to the release kinetics of cTnI,     a negative result within the first hours     of the onset of symptoms does not rule out     myocardial infarction with certainty.     If myocardial infarction is still suspected,     repeat the test at appropriate intervals.  PROTIME-INR      Status: None   Collection Time    03/29/13  7:34 AM      Result Value Range   Prothrombin Time 14.3  11.6 - 15.2 seconds   INR 1.13  0.00 - 1.49  HEMOGLOBIN AND HEMATOCRIT, BLOOD     Status: Abnormal   Collection Time    03/29/13  7:34 AM      Result Value Range   Hemoglobin 8.2 (*) 13.0 - 17.0 g/dL   HCT 16.1 (*) 09.6 - 04.5 %  GLUCOSE, CAPILLARY     Status: Abnormal   Collection Time    03/29/13  7:42 AM      Result Value Range   Glucose-Capillary 173 (*) 70 - 99 mg/dL   Nm Gi Blood Loss  40/98/1191   CLINICAL DATA:  Black tarry stools. Syncopal episode. Hemoglobin is 8.0. History of gastric bypass surgery  EXAM: NUCLEAR MEDICINE GASTROINTESTINAL BLEEDING SCAN  TECHNIQUE: Sequential abdominal images were obtained following intravenous administration of Tc-32m labeled red blood cells.  COMPARISON:  CT of the abdomen and pelvis on 03/24/2013  RADIOPHARMACEUTICALS:  21. Tc-96m in-vitro labeled red cells.  FINDINGS: On the 1 hour images, there is a small amount of activity seen within the epigastric  region, seen to migrate inferiorly. On the 2 hr images, the activity crosses midline. Findings are favored to be related to the region of surgery, either within the gastric remnant and duodenum, or within the gastric jejunostomy based on comparison with the CT exam.  IMPRESSION: 1. This study is positive. 2. The most likely source of bleeding is felt to be within the epigastric region, either within the stomach/duodenum or within the gastrojejunostomy. 3. The findings were discussed with COURTNEY, HORTON on 03/29/2013 at 12:19 AM.   Electronically Signed   By: Rosalie Gums M.D.   On: 03/29/2013 00:20    Assessment/Plan GI bleed, presumed to be upper. On PPI gtt BP and HR stable, Hgb stable though didn't come up a whole lot with 2 PRBC Discussed with pt and wife, possible need for angio/embolization if continued evidence of active bleed. Explained procedure, risks, complications, use of  sedation. Labs reviewed, next Hgb at 1200 Consent will be signed if we move forward with procedure.  Brayton El PA-C 03/29/2013, 10:13 AM

## 2013-03-30 DIAGNOSIS — D62 Acute posthemorrhagic anemia: Secondary | ICD-10-CM

## 2013-03-30 DIAGNOSIS — I10 Essential (primary) hypertension: Secondary | ICD-10-CM

## 2013-03-30 DIAGNOSIS — K219 Gastro-esophageal reflux disease without esophagitis: Secondary | ICD-10-CM

## 2013-03-30 LAB — CBC
HCT: 21.7 % — ABNORMAL LOW (ref 39.0–52.0)
Hemoglobin: 7.6 g/dL — ABNORMAL LOW (ref 13.0–17.0)
MCH: 30.2 pg (ref 26.0–34.0)
MCHC: 35 g/dL (ref 30.0–36.0)
MCV: 86.1 fL (ref 78.0–100.0)
Platelets: 174 10*3/uL (ref 150–400)
RBC: 2.52 MIL/uL — ABNORMAL LOW (ref 4.22–5.81)
RDW: 14.5 % (ref 11.5–15.5)
WBC: 3.8 10*3/uL — ABNORMAL LOW (ref 4.0–10.5)

## 2013-03-30 LAB — BASIC METABOLIC PANEL
BUN: 10 mg/dL (ref 6–23)
CO2: 23 mEq/L (ref 19–32)
Calcium: 8.1 mg/dL — ABNORMAL LOW (ref 8.4–10.5)
Chloride: 110 mEq/L (ref 96–112)
Creatinine, Ser: 0.9 mg/dL (ref 0.50–1.35)
GFR calc Af Amer: >90 mL/min (ref >90)
GFR calc non Af Amer: >90 mL/min (ref >90)
Glucose, Bld: 149 mg/dL — ABNORMAL HIGH (ref 70–99)
Potassium: 3.7 mEq/L (ref 3.5–5.1)
Sodium: 139 mEq/L (ref 135–145)

## 2013-03-30 LAB — GLUCOSE, CAPILLARY
Glucose-Capillary: 106 mg/dL — ABNORMAL HIGH (ref 70–99)
Glucose-Capillary: 125 mg/dL — ABNORMAL HIGH (ref 70–99)
Glucose-Capillary: 147 mg/dL — ABNORMAL HIGH (ref 70–99)
Glucose-Capillary: 149 mg/dL — ABNORMAL HIGH (ref 70–99)
Glucose-Capillary: 153 mg/dL — ABNORMAL HIGH (ref 70–99)
Glucose-Capillary: 250 mg/dL — ABNORMAL HIGH (ref 70–99)

## 2013-03-30 LAB — HEMOGLOBIN AND HEMATOCRIT, BLOOD
HCT: 25.2 % — ABNORMAL LOW (ref 39.0–52.0)
HCT: 28.7 % — ABNORMAL LOW (ref 39.0–52.0)
Hemoglobin: 8.5 g/dL — ABNORMAL LOW (ref 13.0–17.0)
Hemoglobin: 9.8 g/dL — ABNORMAL LOW (ref 13.0–17.0)

## 2013-03-30 LAB — PREPARE RBC (CROSSMATCH)

## 2013-03-30 NOTE — Progress Notes (Signed)
Subjective: No pain, feels better this AM, blood is infusing.    Objective: Vital signs in last 24 hours: Temp:  [97.5 F (36.4 C)-99.2 F (37.3 C)] 98.2 F (36.8 C) (12/11 0644) Pulse Rate:  [63-98] 73 (12/11 0644) Resp:  [10-28] 16 (12/11 0644) BP: (93-117)/(57-67) 93/63 mmHg (12/11 0644) SpO2:  [97 %-100 %] 98 % (12/11 0644) Weight:  [114.1 kg (251 lb 8.7 oz)] 114.1 kg (251 lb 8.7 oz) (12/11 0400) Last BM Date: 03/29/13 2 stools yesterday BP down some, but, not hypotensive 3AM: H/H 7.6/21.7 Blood ordered Intake/Output from previous day: 12/10 0701 - 12/11 0700 In: 2762.5 [I.V.:2750; Blood:12.5] Out: 1650 [Urine:1650] Intake/Output this shift:    General appearance: alert, cooperative and no distress GI: soft, non-tender; bowel sounds normal; no masses,  no organomegaly  Lab Results:   Recent Labs  03/28/13 1810  03/29/13 1343 03/30/13 0336  WBC 6.9  --   --  3.8*  HGB 8.0*  < > 8.7* 7.6*  HCT 23.0*  < > 25.9* 21.7*  PLT 297  --   --  174  < > = values in this interval not displayed.  BMET  Recent Labs  03/28/13 1810 03/28/13 1857 03/30/13 0336  NA 134* 138 139  K 3.7 3.8 3.7  CL 100 100 110  CO2 23  --  23  GLUCOSE 267* 264* 149*  BUN 27* 26* 10  CREATININE 0.88 1.00 0.90  CALCIUM 8.3*  --  8.1*   PT/INR  Recent Labs  03/29/13 0734  LABPROT 14.3  INR 1.13     Recent Labs Lab 03/23/13 1540 03/28/13 1810  AST 19 15  ALT 17 12  ALKPHOS 39 35*  BILITOT 0.6 0.3  PROT 6.7 5.3*  ALBUMIN 3.9 3.1*     Lipase     Component Value Date/Time   LIPASE 39.0 03/23/2013 1540     Studies/Results: Nm Gi Blood Loss  03/29/2013   CLINICAL DATA:  Black tarry stools. Syncopal episode. Hemoglobin is 8.0. History of gastric bypass surgery  EXAM: NUCLEAR MEDICINE GASTROINTESTINAL BLEEDING SCAN  TECHNIQUE: Sequential abdominal images were obtained following intravenous administration of Tc-64m labeled red blood cells.  COMPARISON:  CT of the abdomen  and pelvis on 03/24/2013  RADIOPHARMACEUTICALS:  21. Tc-65m in-vitro labeled red cells.  FINDINGS: On the 1 hour images, there is a small amount of activity seen within the epigastric region, seen to migrate inferiorly. On the 2 hr images, the activity crosses midline. Findings are favored to be related to the region of surgery, either within the gastric remnant and duodenum, or within the gastric jejunostomy based on comparison with the CT exam.  IMPRESSION: 1. This study is positive. 2. The most likely source of bleeding is felt to be within the epigastric region, either within the stomach/duodenum or within the gastrojejunostomy. 3. The findings were discussed with COURTNEY, HORTON on 03/29/2013 at 12:19 AM.   Electronically Signed   By: Rosalie Gums M.D.   On: 03/29/2013 00:20    Medications: . insulin aspart  0-15 Units Subcutaneous Q4H  . sodium chloride  3 mL Intravenous Q12H    Assessment/Plan GI bleed. Hx of RYGB 2 years ago. Neg EGD and CT. NM scan last night showed source in upper abd.  Possible source gastric remnant ulcer, small bowel or transverse colon AODM SLEEP Apnea Hypertension   Plan:  He has blood infusing,  Maroon colored stool yesterday. No pain.  If he starts bleeding again Dr. Marvene Staff  Would recommend Angiogram.  We will continue to follow.  LOS: 2 days    Ananda Sitzer 03/30/2013

## 2013-03-30 NOTE — Progress Notes (Signed)
Pt is using home CPAP machine and required no assistance with application. Pt looks comfortable and vital signs are stable with SpO2 100%. RT will continue to monitor as needed.

## 2013-03-30 NOTE — Progress Notes (Signed)
[facility] PROGRESS NOTE  Brent [name]:[id number] DOB: May 13, 1955 DOA: 03/28/2013 PCP: Danise Edge, MD  Assessment/Plan: . Acute GI bleeding - S/P EGD on Monday 12/8 per Dr. Marina Goodell  -Status post transfusion of 2 units of packed red blood cells on admission and hemoglobin improved to 8.2, but followup drop to 7.6 and transfuse another unit of pack red blood cells early this a.m. -Continue PPI -Plan is for angio/ for possible embolization if he rebleeds  -Appreciate surgery assistance and GI input  . Anemia -As above  . Diabetes type 2, controlled -monitor Accu-Cheks and cover with sliding scale insulin  . CAD (coronary artery disease) -He is chest pain-free  . HTN (hypertension) -BP stable, Monitor blood pressures and further treat accordingly  . OSA (obstructive sleep apnea) .h/o DIVERTICULOSIS, COLON  Code Status: full Family Communication: wife at bedside Disposition Plan: monitor in step down today   Consultants:  GI  Surgery   Procedures:  none  Antibiotics:  none  HPI/Subjective: Yesterday had bloody stool-maroon blood Denies nausea vomiting.  Objective: Filed Vitals:   03/30/13 0945  BP:   Pulse: 74  Temp: 98.1 F (36.7 C)  Resp: 18    Intake/Output Summary (Last 24 hours) at 03/30/13 1000 Last data filed at 03/30/13 0945  Gross per 24 hour  Intake 2764.58 ml  Output   1650 ml  Net 1114.58 ml   Filed Weights   03/29/13 0030 03/30/13 0400  Weight: 112.6 kg (248 lb 3.8 oz) 114.1 kg (251 lb 8.7 oz)    Exam:  General: alert & oriented x 3 In NAD Cardiovascular: RRR, nl S1 s2 Respiratory: CTAB Abdomen: soft +BS nontender/ND, no masses palpable Extremities: No cyanosis and no edema    Data Reviewed: Basic Metabolic Panel:  Recent Labs Lab 03/23/13 1610 03/28/13 1810 03/28/13 1857 03/30/13 0336  NA  --  134* 138 139  K  --  3.7 3.8 3.7  CL  --  100 100 110  CO2  --  23  --  23  GLUCOSE  --  267* 264* 149*  BUN 15  27* 26* 10  CREATININE 0.9 0.88 1.00 0.90  CALCIUM  --  8.3*  --  8.1*   Liver Function Tests:  Recent Labs Lab 03/23/13 1540 03/28/13 1810  AST 19 15  ALT 17 12  ALKPHOS 39 35*  BILITOT 0.6 0.3  PROT 6.7 5.3*  ALBUMIN 3.9 3.1*    Recent Labs Lab 03/23/13 1540  LIPASE 39.0   No results found for this basename: AMMONIA,  in the last 168 hours CBC:  Recent Labs Lab 03/23/13 1540 03/28/13 1810 03/28/13 1857 03/29/13 0734 03/29/13 1343 03/30/13 0336  WBC 6.9 6.9  --   --   --  3.8*  NEUTROABS 4.2 3.9  --   --   --   --   HGB 13.1 8.0* 7.8* 8.2* 8.7* 7.6*  HCT 38.0* 23.0* 23.0* 23.7* 25.9* 21.7*  MCV 86.9 87.1  --   --   --  86.1  PLT 222.0 297  --   --   --  174   Cardiac Enzymes:  Recent Labs Lab 03/29/13 0143 03/29/13 0734 03/29/13 1340  TROPONINI <0.30 <0.30 <0.30   BNP (last 3 results) No results found for this basename: PROBNP,  in the last 8760 hours CBG:  Recent Labs Lab 03/29/13 0751 03/29/13 1146 03/29/13 1608 03/29/13 2331 03/30/13 0812  GLUCAP 165* 200* 160* 133* 147*    Recent Results (  from the past 240 hour(s))  MRSA PCR SCREENING     Status: None   Collection Time    03/29/13 12:41 AM      Result Value Range Status   MRSA by PCR NEGATIVE  NEGATIVE Final   Comment:            The GeneXpert MRSA Assay (FDA     approved for NASAL specimens     only), is one component of a     comprehensive MRSA colonization     surveillance program. It is not     intended to diagnose MRSA     infection nor to guide or     monitor treatment for     MRSA infections.     Studies: Nm Gi Blood Loss  03/29/2013   CLINICAL DATA:  Black tarry stools. Syncopal episode. Hemoglobin is 8.0. History of gastric bypass surgery  EXAM: NUCLEAR MEDICINE GASTROINTESTINAL BLEEDING SCAN  TECHNIQUE: Sequential abdominal images were obtained following intravenous administration of Tc-82m labeled red blood cells.  COMPARISON:  CT of the abdomen and pelvis on  03/24/2013  RADIOPHARMACEUTICALS:  21. Tc-14m in-vitro labeled red cells.  FINDINGS: On the 1 hour images, there is a small amount of activity seen within the epigastric region, seen to migrate inferiorly. On the 2 hr images, the activity crosses midline. Findings are favored to be related to the region of surgery, either within the gastric remnant and duodenum, or within the gastric jejunostomy based on comparison with the CT exam.  IMPRESSION: 1. This study is positive. 2. The most likely source of bleeding is felt to be within the epigastric region, either within the stomach/duodenum or within the gastrojejunostomy. 3. The findings were discussed with COURTNEY, HORTON on 03/29/2013 at 12:19 AM.   Electronically Signed   By: Rosalie Gums M.D.   On: 03/29/2013 00:20    Scheduled Meds: . insulin aspart  0-15 Units Subcutaneous Q4H  . sodium chloride  3 mL Intravenous Q12H   Continuous Infusions: . dextrose 5 % and 0.9% NaCl 100 mL/hr at 03/29/13 1611  . pantoprozole (PROTONIX) infusion 8 mg/hr (03/30/13 0910)    Principal Problem:   Acute GI bleeding Active Problems:   Diabetes type 2, controlled   Morbid obesity   DIVERTICULOSIS, COLON   HTN (hypertension)   CAD (coronary artery disease)   OSA (obstructive sleep apnea)   Anemia    Time spent: 25    [facility]  [facility] Pager [phone]. If 7PM-7AM, please contact night-coverage at [email or web address], password [facility] 03/30/2013, 10:00 AM  LOS: 2 days

## 2013-03-30 NOTE — Progress Notes (Signed)
Patient interviewed and examined, agree with PA note above. As the patient is seen this afternoon he has had no further bowel movements. He feels well without abdominal pain. It appears that his bleeding has ceased. Continue observation for appear  Mariella Saa MD, FACS  03/30/2013 6:28 PM

## 2013-03-30 NOTE — Progress Notes (Signed)
[name] Progress Note  Subjective:  Feels ok.  Last BM was yesterday afternoon.  Only getting ice chips.  Mild discomfort on the right side of his abdomen; thinks it might be a gas pocket.  Objective:  Vital signs in last 24 hours: Temp:  [97.5 F (36.4 C)-98.7 F (37.1 C)] 98.4 F (36.9 C) (12/11 1045) Pulse Rate:  [63-98] 72 (12/11 1045) Resp:  [10-28] 16 (12/11 1045) BP: (93-117)/(57-70) 100/70 mmHg (12/11 1000) SpO2:  [96 %-100 %] 97 % (12/11 1045) Weight:  [251 lb 8.7 oz (114.1 kg)] 251 lb 8.7 oz (114.1 kg) (12/11 0400) Last BM Date: 03/29/13 General:  Alert, Well-developed, in NAD Heart:  Regular rate and rhythm; no murmurs Pulm:  CTAB.  No W/R/R. Abdomen:  Soft, non-distended.  BS present.  Mild right-sided TTP without R/R/G.  Extremities:  Without edema. Neurologic:  Alert and  oriented x4;  grossly normal neurologically. Psych:  Alert and cooperative. Normal mood and affect.  Intake/Output from previous day: 12/10 0701 - 12/11 0700 In: 2787.5 [I.V.:2775; Blood:12.5] Out: 1650 [Urine:1650] Intake/Output this shift: Total I/O In: 714.6 [I.V.:200; Blood:514.6] Out: 400 [Urine:400]  Lab Results:  Recent Labs  03/28/13 1810  03/29/13 0734 03/29/13 1343 03/30/13 0336  WBC 6.9  --   --   --  3.8*  HGB 8.0*  < > 8.2* 8.7* 7.6*  HCT 23.0*  < > 23.7* 25.9* 21.7*  PLT 297  --   --   --  174  < > = values in this interval not displayed. BMET  Recent Labs  03/28/13 1810 03/28/13 1857 03/30/13 0336  NA 134* 138 139  K 3.7 3.8 3.7  CL 100 100 110  CO2 23  --  23  GLUCOSE 267* 264* 149*  BUN 27* 26* 10  CREATININE 0.88 1.00 0.90  CALCIUM 8.3*  --  8.1*   LFT  Recent Labs  03/28/13 1810  PROT 5.3*  ALBUMIN 3.1*  AST 15  ALT 12  ALKPHOS 35*  BILITOT 0.3   PT/INR  Recent Labs  03/29/13 0734  LABPROT 14.3  INR 1.13   Nm Gi Blood Loss  03/29/2013   CLINICAL DATA:  Black tarry stools. Syncopal episode. Hemoglobin is 8.0. History  of gastric bypass surgery  EXAM: NUCLEAR MEDICINE GASTROINTESTINAL BLEEDING SCAN  TECHNIQUE: Sequential abdominal images were obtained following intravenous administration of Tc-49m labeled red blood cells.  COMPARISON:  CT of the abdomen and pelvis on 03/24/2013  RADIOPHARMACEUTICALS:  21. Tc-59m in-vitro labeled red cells.  FINDINGS: On the 1 hour images, there is a small amount of activity seen within the epigastric region, seen to migrate inferiorly. On the 2 hr images, the activity crosses midline. Findings are favored to be related to the region of surgery, either within the gastric remnant and duodenum, or within the gastric jejunostomy based on comparison with the CT exam.  IMPRESSION: 1. This study is positive. 2. The most likely source of bleeding is felt to be within the epigastric region, either within the stomach/duodenum or within the gastrojejunostomy. 3. The findings were discussed with Brent [name], Brent [name] on 03/29/2013 at 12:19 AM.   Electronically Signed   By: Rosalie Gums M.D.   On: 03/29/2013 00:20    Assessment / Plan: -UGIB: With recent history of abdominal pain and no identifiable source on EGD or CT scan, suspect possible ulcer in gastric remnant, non-visualized small bowel, or anastomosis. NM bleeding scan showed source in upper abdomen, but he was not sent  to IR for angio.  IR has seen him and will proceed with angio for embolization if bleeding recurs.  -ABLA: Hgb was down 5 grams from 5 days prior (8.0 grams on admission). S/p transfusion with two units PRBC's. Hgb is down some again this AM to 7.6 grams and he is received one more unit this AM.  -Roux-en-Y gastric bypass 2 years ago. Surgery is following as well.   *Continue PPI gtt. *Monitor Hgb closely and transfuse prn. *IR for embolization if bleeding recurs.    LOS: 2 days   Brent [name], Brent D.  03/30/2013, 11:36 AM  Pager number [phone]   GI ATTENDING  Interval history and laboratories reviewed. Patient  personally seen and examined. Family in the room this evening. No bowel movements. Hemodynamically stable. Received transfusions earlier for hemoglobin less than 8. Differential diagnosis remains NSAID induced gastric or duodenal ulcer versus anastomotic. Continue with PPI and monitoring of stools/hemoglobin. Will advance diet. We will continue to follow.  Brent [name]. Brent [name]., M.D. [facility] Division of Gastroenterology

## 2013-03-31 LAB — TYPE AND SCREEN
ABO/RH(D): A POS
Antibody Screen: NEGATIVE
Unit division: 0
Unit division: 0
Unit division: 0

## 2013-03-31 LAB — GLUCOSE, CAPILLARY
Glucose-Capillary: 138 mg/dL — ABNORMAL HIGH (ref 70–99)
Glucose-Capillary: 165 mg/dL — ABNORMAL HIGH (ref 70–99)
Glucose-Capillary: 173 mg/dL — ABNORMAL HIGH (ref 70–99)
Glucose-Capillary: 134 mg/dL — ABNORMAL HIGH (ref 70–99)
Glucose-Capillary: 165 mg/dL — ABNORMAL HIGH (ref 70–99)
Glucose-Capillary: 112 mg/dL — ABNORMAL HIGH (ref 70–99)

## 2013-03-31 LAB — CBC
HCT: 26.1 % — ABNORMAL LOW (ref 39.0–52.0)
Hemoglobin: 8.7 g/dL — ABNORMAL LOW (ref 13.0–17.0)
MCH: 29.3 pg (ref 26.0–34.0)
MCHC: 33.3 g/dL (ref 30.0–36.0)
MCV: 87.9 fL (ref 78.0–100.0)
Platelets: 194 10*3/uL (ref 150–400)
RBC: 2.97 MIL/uL — ABNORMAL LOW (ref 4.22–5.81)
RDW: 14 % (ref 11.5–15.5)
WBC: 3.7 10*3/uL — ABNORMAL LOW (ref 4.0–10.5)

## 2013-03-31 LAB — HEMOGLOBIN AND HEMATOCRIT, BLOOD
HCT: 24.6 % — ABNORMAL LOW (ref 39.0–52.0)
HCT: 25.8 % — ABNORMAL LOW (ref 39.0–52.0)
Hemoglobin: 8.3 g/dL — ABNORMAL LOW (ref 13.0–17.0)
Hemoglobin: 8.7 g/dL — ABNORMAL LOW (ref 13.0–17.0)

## 2013-03-31 NOTE — Plan of Care (Signed)
Problem: Phase I Progression Outcomes Goal: OOB as tolerated unless otherwise ordered Outcome: Progressing Assisted by wife to [facility]

## 2013-03-31 NOTE — Progress Notes (Signed)
Patient ID: Brent [name], male   DOB: 30-Jul-1955, 57 y.o.   MRN: [id number]    Subjective: Patient feels well this morning.  No further bloody BMs.  No nausea.  Tolerating full liquids with no issues  Objective: Vital signs in last 24 hours: Temp:  [97.6 F (36.4 C)-98.6 F (37 C)] 97.6 F (36.4 C) (12/12 0318) Pulse Rate:  [60-88] 68 (12/12 0600) Resp:  [15-21] 16 (12/12 0600) BP: (97-128)/(58-71) 112/71 mmHg (12/12 0600) SpO2:  [96 %-100 %] 98 % (12/12 0600) Weight:  [248 lb 10.9 oz (112.8 kg)] 248 lb 10.9 oz (112.8 kg) (12/12 0200) Last BM Date: 03/30/13  Intake/Output from previous day: 12/11 0701 - 12/12 0700 In: 3920 [P.O.:720; I.V.:2625; Blood:575] Out: 2725 [Urine:2725] Intake/Output this shift:    PE: Abd: soft, NT, ND, +BS,  Heart: regular  Lab Results:   Recent Labs  03/28/13 1810  03/30/13 0336  03/30/13 1719 03/31/13 0400  WBC 6.9  --  3.8*  --   --   --   HGB 8.0*  < > 7.6*  < > 9.8* 8.3*  HCT 23.0*  < > 21.7*  < > 28.7* 24.6*  PLT 297  --  174  --   --   --   < > = values in this interval not displayed. BMET  Recent Labs  03/28/13 1810 03/28/13 1857 03/30/13 0336  NA 134* 138 139  K 3.7 3.8 3.7  CL 100 100 110  CO2 23  --  23  GLUCOSE 267* 264* 149*  BUN 27* 26* 10  CREATININE 0.88 1.00 0.90  CALCIUM 8.3*  --  8.1*   PT/INR  Recent Labs  03/29/13 0734  LABPROT 14.3  INR 1.13   CMP     Component Value Date/Time   NA 139 03/30/2013 0336   K 3.7 03/30/2013 0336   CL 110 03/30/2013 0336   CO2 23 03/30/2013 0336   GLUCOSE 149* 03/30/2013 0336   BUN 10 03/30/2013 0336   CREATININE 0.90 03/30/2013 0336   CREATININE 0.93 11/16/2012 1343   CALCIUM 8.1* 03/30/2013 0336   PROT 5.3* 03/28/2013 1810   ALBUMIN 3.1* 03/28/2013 1810   AST 15 03/28/2013 1810   ALT 12 03/28/2013 1810   ALKPHOS 35* 03/28/2013 1810   BILITOT 0.3 03/28/2013 1810   GFRNONAA >90 03/30/2013 0336   GFRAA >90 03/30/2013 0336   Lipase     Component Value  Date/Time   LIPASE 39.0 03/23/2013 1540       Studies/Results: No results found.  Anti-infectives: Anti-infectives   None       Assessment/Plan  1. UGI bleed 2. ABL anemia, stable (suspect 9.8 last night was a false elevation.  Otherwise patient stable in mid 8 range) Patient Active Problem List   Diagnosis Date Noted  . Acute GI bleeding 03/28/2013  . Anemia 03/28/2013  . H/O gastric bypass 03/23/2013  . Hx of adenomatous colonic polyps 03/23/2013  . Gout 03/04/2013  . Other and unspecified hyperlipidemia 11/16/2012  . OSA (obstructive sleep apnea) 04/07/2012  . CAD (coronary artery disease) 01/12/2012  . Chest pain 12/21/2011  . HTN (hypertension) 07/14/2010  . Morbid obesity 03/27/2010  . DIVERTICULOSIS, COLON 10/29/2008  . Diabetes type 2, controlled 07/05/2007  . ACID REFLUX DISEASE 07/05/2007   Plan: 1. Currently the patient does not have any evidence of further bleeding.  His hgb appears stable.  He is tolerating a full liquid diet.  No plans for surgical intervention at  this time.  We will sign off.  Please call back if he rebleeds.   LOS: 3 days    Graden Hoshino E 03/31/2013, 7:38 AM Pager: [phone]

## 2013-03-31 NOTE — Progress Notes (Signed)
Patient transferring to room 1314.  Report called to Kendal Hymen, Charity fundraiser.  Patient to travel by wheelchair.  Will continue to monitor.

## 2013-03-31 NOTE — Progress Notes (Signed)
[facility] PROGRESS NOTE  Brent [name] ZOX:[id number] DOB: Dec 03, 1955 DOA: 03/28/2013 PCP: Danise Edge, MD  Brief narrative: 57 y.o. male with past medical history of diabetes, CAD, sleep apnea, gout, s/p Roux en Y gastric bypass 2 years ago at [facility] who presented to [facility] ED 03/28/2013 with syncope, BRBPR and melena in addition to drop in hemoglobin by 5 units over teh course of few days. EGD done 03/27/2013 by Dr. Marina Goodell did not reveal acute bleed. Pt is status post 3 units of PRBC since the admission.  Assessment/Plan:  Principal Problem:   Acute lower GI bleeding - status post EGD on 12//11/2012  - status post 3 units of PRBC transfusion since the admission - NM bleeding scan showed source in upper abdomen. IR will proceed with angio for embolization if bleeding recurs. - Continue PPI gtt x 72 hours - recheck CBC now and then daily - transfer to medical floor today   Active Problems:   Acute blood loss anemia - Secondary to GI bleed - Management as above, appreciate GI following   Diabetes type 2, controlled - A1c in 10/2012 was 6.1 indicating good glycemic control   HTN (hypertension) - Blood pressure reasonably controlled, 129/77   Code Status: full code Family Communication: wife at the bedside Disposition Plan: transfer to medical floor today   Manson Passey, MD  [facility] Pager [phone]  If 7PM-7AM, please contact night-coverage [email or web address] Password [facility] 03/31/2013, 11:50 AM   LOS: 3 days   Consultants:  Gastroenterology  Surgery  Interventional radiology  Procedures:  None   Antibiotics:  None   HPI/Subjective: Feels better this am.  Objective: Filed Vitals:   03/31/13 0400 03/31/13 0600 03/31/13 0800 03/31/13 1000  BP: 104/58 112/71 123/70 129/77  Pulse: 79 68 70 76  Temp:   98.5 F (36.9 C)   TempSrc:   Oral   Resp: 20 16 15 22   Height:      Weight:      SpO2: 97% 98% 97% 98%    Intake/Output Summary (Last 24  hours) at 03/31/13 1150 Last data filed at 03/31/13 1100  Gross per 24 hour  Intake   4080 ml  Output   2875 ml  Net   1205 ml    Exam:   General:  Pt is alert, follows commands appropriately, not in acute distress  Cardiovascular: Regular rate and rhythm, S1/S2, no murmurs, no rubs, no gallops  Respiratory: Clear to auscultation bilaterally, no wheezing, no crackles, no rhonchi  Abdomen: Soft, non tender, non distended, bowel sounds present, no guarding  Extremities: Pulses DP and PT palpable bilaterally  Neuro: Grossly nonfocal  Data Reviewed: Basic Metabolic Panel:  Recent Labs Lab 03/28/13 1810 03/28/13 1857 03/30/13 0336  NA 134* 138 139  K 3.7 3.8 3.7  CL 100 100 110  CO2 23  --  23  GLUCOSE 267* 264* 149*  BUN 27* 26* 10  CREATININE 0.88 1.00 0.90  CALCIUM 8.3*  --  8.1*   Liver Function Tests:  Recent Labs Lab 03/28/13 1810  AST 15  ALT 12  ALKPHOS 35*  BILITOT 0.3  PROT 5.3*  ALBUMIN 3.1*   No results found for this basename: LIPASE, AMYLASE,  in the last 168 hours No results found for this basename: AMMONIA,  in the last 168 hours CBC:  Recent Labs Lab 03/28/13 1810  03/29/13 1343 03/30/13 0336 03/30/13 1224 03/30/13 1719 03/31/13 0400  WBC 6.9  --   --  3.8*  --   --   --  NEUTROABS 3.9  --   --   --   --   --   --   HGB 8.0*  < > 8.7* 7.6* 8.5* 9.8* 8.3*  HCT 23.0*  < > 25.9* 21.7* 25.2* 28.7* 24.6*  MCV 87.1  --   --  86.1  --   --   --   PLT 297  --   --  174  --   --   --   < > = values in this interval not displayed. Cardiac Enzymes:  Recent Labs Lab 03/29/13 0143 03/29/13 0734 03/29/13 1340  TROPONINI <0.30 <0.30 <0.30   BNP: No components found with this basename: POCBNP,  CBG:  Recent Labs Lab 03/30/13 1604 03/30/13 1904 03/30/13 2341 03/31/13 0322 03/31/13 0752  GLUCAP 125* 250* 106* 138* 165*    MRSA PCR SCREENING     Status: None   Collection Time    03/29/13 12:41 AM      Result Value Range Status    MRSA by PCR NEGATIVE  NEGATIVE Final     Studies: No results found.  Scheduled Meds: . insulin aspart  0-15 Units Subcutaneous Q4H  . sodium chloride  3 mL Intravenous Q12H   Continuous Infusions: . dextrose 5 % and 0.9% NaCl 100 mL/hr at 03/31/13 1030  . pantoprozole (PROTONIX) infusion 8 mg/hr (03/31/13 0830)

## 2013-03-31 NOTE — Progress Notes (Signed)
[facility] Progress Note  Subjective:  Had two BM's yesterday with some blood, but nothing since the evening.  ? Old blood.  Hgb down slightly this AM.  Objective:  Vital signs in last 24 hours: Temp:  [97.6 F (36.4 C)-98.6 F (37 C)] 97.6 F (36.4 C) (12/12 0318) Pulse Rate:  [60-88] 70 (12/12 0800) Resp:  [15-21] 15 (12/12 0800) BP: (100-128)/(58-71) 123/70 mmHg (12/12 0800) SpO2:  [97 %-100 %] 97 % (12/12 0800) Weight:  [248 lb 10.9 oz (112.8 kg)] 248 lb 10.9 oz (112.8 kg) (12/12 0200) Last BM Date: 03/30/13 General:  Alert, Well-developed, in NAD Heart:  Regular rate and rhythm; no murmurs Pulm:  CTAB.  No W/R/R. Abdomen:  Soft, non-distended.  BS present.  Non-tender. Extremities:  Without edema. Neurologic:  Alert and  oriented x4;  grossly normal neurologically. Psych:  Alert and cooperative. Normal mood and affect.  Intake/Output from previous day: 12/11 0701 - 12/12 0700 In: 4045 [P.O.:720; I.V.:2750; Blood:575] Out: 2725 [Urine:2725] Intake/Output this shift: Total I/O In: 125 [I.V.:125] Out: 250 [Urine:250]  Lab Results:  Recent Labs  03/28/13 1810  03/30/13 0336 03/30/13 1224 03/30/13 1719 03/31/13 0400  WBC 6.9  --  3.8*  --   --   --   HGB 8.0*  < > 7.6* 8.5* 9.8* 8.3*  HCT 23.0*  < > 21.7* 25.2* 28.7* 24.6*  PLT 297  --  174  --   --   --   < > = values in this interval not displayed. BMET  Recent Labs  03/28/13 1810 03/28/13 1857 03/30/13 0336  NA 134* 138 139  K 3.7 3.8 3.7  CL 100 100 110  CO2 23  --  23  GLUCOSE 267* 264* 149*  BUN 27* 26* 10  CREATININE 0.88 1.00 0.90  CALCIUM 8.3*  --  8.1*   LFT  Recent Labs  03/28/13 1810  PROT 5.3*  ALBUMIN 3.1*  AST 15  ALT 12  ALKPHOS 35*  BILITOT 0.3   PT/INR  Recent Labs  03/29/13 0734  LABPROT 14.3  INR 1.13   Assessment / Plan: -UGIB: With recent history of abdominal pain and no identifiable source on EGD or CT scan, suspect possible ulcer in gastric  remnant, non-visualized small bowel, or anastomosis. NM bleeding scan showed source in upper abdomen, but he was not sent to IR for angio. IR has seen him and will proceed with angio for embolization if bleeding recurs.  -ABLA:  S/p 3 units PRBC's since admission.  Hgb down slightly this AM, ? Equilibration. -Roux-en-Y gastric bypass 2 years ago. Surgery signed off.  *Continue PPI gtt x 72 hours.  I believe that he is ok to move out of the ICU today.  *Monitor Hgb closely and transfuse prn.  *IR for embolization if bleeding recurs.     LOS: 3 days   ZEHR, JESSICA D.  03/31/2013, 9:16 AM  Pager number [phone]   GI ATTENDING  Interval history and laboratories reviewed. Patient personally seen and examined. Wife in room. 2 bowel movements in the past 24 hours. Seems to be old blood. Slight drifting hemoglobin. Hemodynamically stable. Continue to monitor hemoglobin stools. Transfuse as needed. Advance diet. Continue PPI. Would not discharge home until stools are normal looking. Dr. Loreta Ave will see for GI this weekend.  Brent [name]. Brent [name]., M.D. [facility] Division of Gastroenterology

## 2013-04-01 LAB — CBC
HCT: 24.9 % — ABNORMAL LOW (ref 39.0–52.0)
Hemoglobin: 8.7 g/dL — ABNORMAL LOW (ref 13.0–17.0)
MCH: 30.5 pg (ref 26.0–34.0)
MCHC: 34.9 g/dL (ref 30.0–36.0)
MCV: 87.4 fL (ref 78.0–100.0)
Platelets: 188 10*3/uL (ref 150–400)
RBC: 2.85 MIL/uL — ABNORMAL LOW (ref 4.22–5.81)
RDW: 14.1 % (ref 11.5–15.5)
WBC: 4.5 10*3/uL (ref 4.0–10.5)

## 2013-04-01 LAB — GLUCOSE, CAPILLARY
Glucose-Capillary: 144 mg/dL — ABNORMAL HIGH (ref 70–99)
Glucose-Capillary: 156 mg/dL — ABNORMAL HIGH (ref 70–99)
Glucose-Capillary: 146 mg/dL — ABNORMAL HIGH (ref 70–99)
Glucose-Capillary: 116 mg/dL — ABNORMAL HIGH (ref 70–99)
Glucose-Capillary: 164 mg/dL — ABNORMAL HIGH (ref 70–99)

## 2013-04-01 MED ORDER — ACETAMINOPHEN 325 MG PO TABS
650.0000 mg | ORAL_TABLET | Freq: Four times a day (QID) | ORAL | Status: DC | PRN
  Administered 2013-04-01 – 2013-04-02 (×2): 650 mg via ORAL
  Filled 2013-04-01 (×2): qty 2

## 2013-04-01 NOTE — Progress Notes (Signed)
Pt has CPAP machine from home, Pt comfortable self administering when ready.  RT to monitor and assess as needed.

## 2013-04-01 NOTE — Progress Notes (Addendum)
[facility] PROGRESS NOTE  Brent [name] RUE:[id number] DOB: 01-Jan-1956 DOA: 03/28/2013 PCP: Danise Edge, MD  Brief narrative: 57 y.o. male with past medical history of diabetes, CAD, sleep apnea, gout, s/p Roux en Y gastric bypass 2 years ago at [facility] who presented to [facility] ED 03/28/2013 with syncope, BRBPR and melena in addition to drop in hemoglobin by 5 units over teh course of few days. EGD done 03/27/2013 by Dr. Marina Goodell did not reveal acute bleed. Pt is status post 3 units of PRBC since the admission.   Assessment/Plan:   Principal Problem:  Acute lower GI bleeding  - status post EGD on 12//11/2012  - status post 3 units of PRBC transfusion since the admission  - NM bleeding scan showed source in upper abdomen. IR will proceed with angio for embolization only if bleeding recurs.  - Continued PPI gtt x 72 hours but this is now discontinued - hemoglobin remains stable at 8.7  - appreciate GI following and their recommendations  Active Problems:  Acute blood loss anemia  - Secondary to GI bleed  - Management as above, appreciate GI following  Diabetes type 2, controlled  - A1c in 10/2012 was 6.1 indicating good glycemic control  HTN (hypertension)  - Blood pressure reasonably controlled, 102/57  Code Status: full code  Family Communication: family not at the bedside  Disposition Plan: home once stable     Consultants:  Gastroenterology  Surgery  Interventional radiology Procedures:  None  Antibiotics:  None    Manson Passey, MD  [facility] Pager [phone]  If 7PM-7AM, please contact night-coverage [email or web address] Password [facility] 04/01/2013, 6:53 AM   LOS: 4 days    HPI/Subjective: No overnight events.   Objective: Filed Vitals:   03/31/13 1351 03/31/13 2031 03/31/13 2034 04/01/13 0546  BP: 136/76 108/74  102/57  Pulse: 80 67 70 75  Temp: 98.2 F (36.8 C) 98.3 F (36.8 C)  97.8 F (36.6 C)  TempSrc: Oral Oral  Axillary  Resp: 20 21 16 15    Height: 5\' 7"  (1.702 m)     Weight:      SpO2: 99% 100% 99% 98%    Intake/Output Summary (Last 24 hours) at 04/01/13 0653 Last data filed at 03/31/13 1730  Gross per 24 hour  Intake 1762.09 ml  Output    975 ml  Net 787.09 ml    Exam:   General:  Pt is alert, follows commands appropriately, not in acute distress  Cardiovascular: Regular rate and rhythm, S1/S2, no murmurs, no rubs, no gallops  Respiratory: Clear to auscultation bilaterally, no wheezing, no crackles, no rhonchi  Abdomen: Soft, non tender, non distended, bowel sounds present, no guarding  Extremities: No edema, pulses DP and PT palpable bilaterally  Neuro: Grossly nonfocal  Data Reviewed: Basic Metabolic Panel:  Recent Labs Lab 03/28/13 1810 03/28/13 1857 03/30/13 0336  NA 134* 138 139  K 3.7 3.8 3.7  CL 100 100 110  CO2 23  --  23  GLUCOSE 267* 264* 149*  BUN 27* 26* 10  CREATININE 0.88 1.00 0.90  CALCIUM 8.3*  --  8.1*   Liver Function Tests:  Recent Labs Lab 03/28/13 1810  AST 15  ALT 12  ALKPHOS 35*  BILITOT 0.3  PROT 5.3*  ALBUMIN 3.1*   No results found for this basename: LIPASE, AMYLASE,  in the last 168 hours No results found for this basename: AMMONIA,  in the last 168 hours CBC:  Recent Labs Lab 03/28/13 1810  03/30/13 0336  03/30/13 1719 03/31/13 0400 03/31/13 1222 03/31/13 1555 04/01/13 0415  WBC 6.9  --  3.8*  --   --   --  3.7*  --  4.5  NEUTROABS 3.9  --   --   --   --   --   --   --   --   HGB 8.0*  < > 7.6*  < > 9.8* 8.3* 8.7* 8.7* 8.7*  HCT 23.0*  < > 21.7*  < > 28.7* 24.6* 26.1* 25.8* 24.9*  MCV 87.1  --  86.1  --   --   --  87.9  --  87.4  PLT 297  --  174  --   --   --  194  --  188  < > = values in this interval not displayed. Cardiac Enzymes:  Recent Labs Lab 03/29/13 0143 03/29/13 0734 03/29/13 1340  TROPONINI <0.30 <0.30 <0.30   BNP: No components found with this basename: POCBNP,  CBG:  Recent Labs Lab 03/31/13 1200 03/31/13 1605  03/31/13 1953 03/31/13 2321 04/01/13 0404  GLUCAP 173* 134* 165* 112* 144*    MRSA PCR SCREENING     Status: None   Collection Time    03/29/13 12:41 AM      Result Value Range Status   MRSA by PCR NEGATIVE  NEGATIVE Final     Studies: No results found.  Scheduled Meds: . insulin aspart  0-15 Units Subcutaneous Q4H  . sodium chloride  3 mL Intravenous Q12H   Continuous Infusions: . dextrose 5 % and 0.9% NaCl 100 mL/hr at 03/31/13 1030

## 2013-04-01 NOTE — Progress Notes (Signed)
Cross cover [facility]GI Subjective: Patient seems to be stable since yesterday. Had a melenic stool this morning but his hemoglobin has remained stable. He denies having any abdominal pain or hematochezia. He is tolerating hsi diet well.   Objective: Vital signs in last 24 hours: Temp:  [97.8 F (36.6 C)-98.3 F (36.8 C)] 98 F (36.7 C) (12/13 1451) Pulse Rate:  [67-77] 77 (12/13 1451) Resp:  [15-21] 18 (12/13 1451) BP: (102-112)/(57-74) 112/62 mmHg (12/13 1451) SpO2:  [97 %-100 %] 97 % (12/13 1451) Last BM Date: 03/31/13  Intake/Output from previous day: 12/12 0701 - 12/13 0700 In: 1637.1 [P.O.:360; I.V.:1277.1] Out: 975 [Urine:975] Intake/Output this shift: Total I/O In: 240 [P.O.:240] Out: 1050 [Urine:1050]  General appearance: alert, cooperative, appears stated age, no distress and morbidly obese Resp: clear to auscultation bilaterally Cardio: regular rate and rhythm, S1, S2 normal, no murmur, click, rub or gallop GI: soft, morbidly obese, non-tender; bowel sounds normal; no masses,  no organomegaly Extremities: extremities normal, atraumatic, no cyanosis or edema  Lab Results:  Recent Labs  03/30/13 0336  03/31/13 1222 03/31/13 1555 04/01/13 0415  WBC 3.8*  --  3.7*  --  4.5  HGB 7.6*  < > 8.7* 8.7* 8.7*  HCT 21.7*  < > 26.1* 25.8* 24.9*  PLT 174  --  194  --  188  < > = values in this interval not displayed. BMET  Recent Labs  03/30/13 0336  NA 139  K 3.7  CL 110  CO2 23  GLUCOSE 149*  BUN 10  CREATININE 0.90  CALCIUM 8.1*   Medications: I have reviewed the patient's current medications.  Assessment/Plan: 1) Acute anemia after GI bleed thought to be due to possible ulceration in a gastric remnant He seems stable today. I anticipate D/C in AM if hemoglobin re,mains stable with plans for close follow up with Dr. Marina Goodell in the next week or earlier if needed. He should NOT be sent home if he still has melenic stools tomorrow.   2) AODM. 3) HTN/CAD.  4)  GERD. 5) S/P Gastric bypass-Roux-en-Y. . LOS: 4 days   Roselia Snipe 04/01/2013, 3:18 PM

## 2013-04-02 LAB — GLUCOSE, CAPILLARY
Glucose-Capillary: 104 mg/dL — ABNORMAL HIGH (ref 70–99)
Glucose-Capillary: 127 mg/dL — ABNORMAL HIGH (ref 70–99)
Glucose-Capillary: 126 mg/dL — ABNORMAL HIGH (ref 70–99)
Glucose-Capillary: 92 mg/dL (ref 70–99)
Glucose-Capillary: 115 mg/dL — ABNORMAL HIGH (ref 70–99)
Glucose-Capillary: 109 mg/dL — ABNORMAL HIGH (ref 70–99)
Glucose-Capillary: 97 mg/dL (ref 70–99)

## 2013-04-02 LAB — PREPARE RBC (CROSSMATCH)

## 2013-04-02 LAB — CBC
HCT: 23.9 % — ABNORMAL LOW (ref 39.0–52.0)
HCT: 23.7 % — ABNORMAL LOW (ref 39.0–52.0)
Hemoglobin: 8.1 g/dL — ABNORMAL LOW (ref 13.0–17.0)
Hemoglobin: 7.9 g/dL — ABNORMAL LOW (ref 13.0–17.0)
MCH: 29.7 pg (ref 26.0–34.0)
MCH: 29.4 pg (ref 26.0–34.0)
MCHC: 33.9 g/dL (ref 30.0–36.0)
MCHC: 33.3 g/dL (ref 30.0–36.0)
MCV: 87.5 fL (ref 78.0–100.0)
MCV: 88.1 fL (ref 78.0–100.0)
Platelets: 195 10*3/uL (ref 150–400)
Platelets: 206 10*3/uL (ref 150–400)
RBC: 2.73 MIL/uL — ABNORMAL LOW (ref 4.22–5.81)
RBC: 2.69 MIL/uL — ABNORMAL LOW (ref 4.22–5.81)
RDW: 14.1 % (ref 11.5–15.5)
RDW: 14 % (ref 11.5–15.5)
WBC: 4.6 10*3/uL (ref 4.0–10.5)
WBC: 3.9 10*3/uL — ABNORMAL LOW (ref 4.0–10.5)

## 2013-04-02 MED ORDER — PANTOPRAZOLE SODIUM 40 MG PO TBEC
40.0000 mg | DELAYED_RELEASE_TABLET | Freq: Two times a day (BID) | ORAL | Status: DC
  Administered 2013-04-02 – 2013-04-03 (×3): 40 mg via ORAL
  Filled 2013-04-02 (×4): qty 1

## 2013-04-02 MED ORDER — VITAMIN B-12 1000 MCG PO TABS
1000.0000 ug | ORAL_TABLET | Freq: Every day | ORAL | Status: DC
  Administered 2013-04-02 – 2013-04-03 (×2): 1000 ug via ORAL
  Filled 2013-04-02 (×2): qty 1

## 2013-04-02 MED ORDER — CENTRUM PO CHEW
1.0000 | CHEWABLE_TABLET | Freq: Every day | ORAL | Status: DC
  Administered 2013-04-02 – 2013-04-03 (×2): 1 via ORAL
  Filled 2013-04-02 (×2): qty 1

## 2013-04-02 NOTE — Progress Notes (Signed)
Cross cover-[facility] Subjective: Since I last evaluated the patient, he has been stable from a GI standpoint. He denies having any abdominal pain, nausea or vomiting. No BM this morning. Tolerating his diet well.   Objective: Vital signs in last 24 hours: Temp:  [98 F (36.7 C)-98.5 F (36.9 C)] 98.4 F (36.9 C) (12/14 0625) Pulse Rate:  [69-77] 69 (12/14 0625) Resp:  [18-20] 20 (12/14 0625) BP: (100-125)/(62-71) 100/64 mmHg (12/14 0625) SpO2:  [97 %-99 %] 99 % (12/14 0625) Last BM Date: 04/01/13  Intake/Output from previous day: 12/13 0701 - 12/14 0700 In: 3020 [P.O.:1320; I.V.:1700] Out: 4425 [Urine:4425] Intake/Output this shift:   General appearance: alert, cooperative, appears stated age, no distress and morbidly obese Resp: clear to auscultation bilaterally Cardio: regular rate and rhythm, S1, S2 normal, no murmur, click, rub or gallop GI: soft, non-tender; bowel sounds normal; no masses,  no organomegaly Extremities: extremities normal, atraumatic, no cyanosis or edema  Lab Results:  Recent Labs  03/31/13 1222 03/31/13 1555 04/01/13 0415 04/02/13 0441  WBC 3.7*  --  4.5 4.6  HGB 8.7* 8.7* 8.7* 8.1*  HCT 26.1* 25.8* 24.9* 23.9*  PLT 194  --  188 195   Medications: I have reviewed the patient's current medications.  Assessment/Plan: 1) Anemia secondary to GI bleed ?ulceration in the UGI tract patient is s/p Roux-n-Y gastric bypass -seems stable. Hemoglobin has dropped a little today but he seems clinically stable.  Will wait for the patient to have another BM before discharge. It may be a good idea to have a CBC checked again this afternoon. Patient advised to hold off on all NSAIDS including Aspirin. 2) Acid reflux.  3) History of adenomatous polyps.    LOS: 5 days   Starasia Sinko 04/02/2013, 10:14 AM

## 2013-04-02 NOTE — Progress Notes (Signed)
Pt has home CPAP from home and is comfortable self administering when ready.  RT to monitor and assess as needed.

## 2013-04-02 NOTE — Progress Notes (Signed)
[facility] PROGRESS NOTE  Brent [name] ZOX:[id number] DOB: June 02, 1955 DOA: 03/28/2013 PCP: Danise Edge, MD  Brief narrative: 57 y.o. male with past medical history of diabetes, CAD, sleep apnea, gout, s/p Roux en Y gastric bypass 2 years ago at [facility] who presented to [facility] ED 03/28/2013 with syncope, BRBPR and melena in addition to drop in hemoglobin by 5 units over teh course of few days. EGD done 03/27/2013 by Dr. Marina Goodell did not reveal acute bleed. Pt is status post 3 units of PRBC since the admission.   Assessment/Plan:   Principal Problem:  Acute lower GI bleeding  - status post EGD on 12//11/2012  - status post 3 units of PRBC transfusion since the admission  - NM bleeding scan showed source in upper abdomen. IR will proceed with angio for embolization only if bleeding recurs.  - was on PPI drip but now on PO protonix BID - repeat CBC later today and if less than 8 will give 1 U PRBC - appreciate GI following and their recommendations   Active Problems:  Acute blood loss anemia  - Secondary to GI bleed  - Management as above, appreciate GI following  Diabetes type 2, controlled  - A1c in 10/2012 was 6.1 indicating good glycemic control  HTN (hypertension)  - Blood pressure reasonably controlled  Code Status: full code  Family Communication: family not at the bedside  Disposition Plan: home when stable  Consultants:  Gastroenterology  Surgery  Interventional radiology Procedures:  None  Antibiotics:  None   Manson Passey, MD  [facility] Pager [phone]  If 7PM-7AM, please contact night-coverage [email or web address] Password [email or web address] 04/02/2013, 6:50 AM   LOS: 5 days    HPI/Subjective: No overnight events.   Objective: Filed Vitals:   04/01/13 0546 04/01/13 1451 04/01/13 2111 04/02/13 0625  BP: 102/57 112/62 125/71 100/64  Pulse: 75 77 75 69  Temp: 97.8 F (36.6 C) 98 F (36.7 C) 98.5 F (36.9 C) 98.4 F (36.9 C)  TempSrc: Axillary Oral Oral Oral   Resp: 15 18 20 20   Height:      Weight:      SpO2: 98% 97% 98% 99%    Intake/Output Summary (Last 24 hours) at 04/02/13 0650 Last data filed at 04/02/13 0600  Gross per 24 hour  Intake   3020 ml  Output   4425 ml  Net  -1405 ml    Exam:   General:  Pt is alert, follows commands appropriately, not in acute distress  Cardiovascular: Regular rate and rhythm, S1/S2, no murmurs, no rubs, no gallops  Respiratory: Clear to auscultation bilaterally, no wheezing, no crackles, no rhonchi  Abdomen: Soft, non tender, non distended, bowel sounds present, no guarding  Extremities: No edema, pulses DP and PT palpable bilaterally  Neuro: Grossly nonfocal  Data Reviewed: Basic Metabolic Panel:  Recent Labs Lab 03/28/13 1810 03/28/13 1857 03/30/13 0336  NA 134* 138 139  K 3.7 3.8 3.7  CL 100 100 110  CO2 23  --  23  GLUCOSE 267* 264* 149*  BUN 27* 26* 10  CREATININE 0.88 1.00 0.90  CALCIUM 8.3*  --  8.1*   Liver Function Tests:  Recent Labs Lab 03/28/13 1810  AST 15  ALT 12  ALKPHOS 35*  BILITOT 0.3  PROT 5.3*  ALBUMIN 3.1*   No results found for this basename: LIPASE, AMYLASE,  in the last 168 hours No results found for this basename: AMMONIA,  in the last 168 hours CBC:  Recent  Labs Lab 03/28/13 1810  03/30/13 0336  03/31/13 0400 03/31/13 1222 03/31/13 1555 04/01/13 0415 04/02/13 0441  WBC 6.9  --  3.8*  --   --  3.7*  --  4.5 4.6  NEUTROABS 3.9  --   --   --   --   --   --   --   --   HGB 8.0*  < > 7.6*  < > 8.3* 8.7* 8.7* 8.7* 8.1*  HCT 23.0*  < > 21.7*  < > 24.6* 26.1* 25.8* 24.9* 23.9*  MCV 87.1  --  86.1  --   --  87.9  --  87.4 87.5  PLT 297  --  174  --   --  194  --  188 195  < > = values in this interval not displayed. Cardiac Enzymes:  Recent Labs Lab 03/29/13 0143 03/29/13 0734 03/29/13 1340  TROPONINI <0.30 <0.30 <0.30   BNP: No components found with this basename: POCBNP,  CBG:  Recent Labs Lab 04/01/13 1154 04/01/13 1719  04/01/13 2107 04/02/13 0101 04/02/13 0500  GLUCAP 146* 116* 164* 104* 127*    Recent Results (from the past 240 hour(s))  MRSA PCR SCREENING     Status: None   Collection Time    03/29/13 12:41 AM      Result Value Range Status   MRSA by PCR NEGATIVE  NEGATIVE Final   Comment:            The GeneXpert MRSA Assay (FDA     approved for NASAL specimens     only), is one component of a     comprehensive MRSA colonization     surveillance program. It is not     intended to diagnose MRSA     infection nor to guide or     monitor treatment for     MRSA infections.     Studies: No results found.  Scheduled Meds: . insulin aspart  0-15 Units Subcutaneous Q4H  . sodium chloride  3 mL Intravenous Q12H   Continuous Infusions: . dextrose 5 % and 0.9% NaCl 1,000 mL (04/02/13 0155)

## 2013-04-03 ENCOUNTER — Telehealth: Payer: Self-pay | Admitting: Family Medicine

## 2013-04-03 DIAGNOSIS — K922 Gastrointestinal hemorrhage, unspecified: Secondary | ICD-10-CM

## 2013-04-03 LAB — TYPE AND SCREEN
ABO/RH(D): A POS
Antibody Screen: NEGATIVE
Unit division: 0

## 2013-04-03 LAB — CBC
HCT: 25.7 % — ABNORMAL LOW (ref 39.0–52.0)
Hemoglobin: 8.9 g/dL — ABNORMAL LOW (ref 13.0–17.0)
MCH: 30 pg (ref 26.0–34.0)
MCHC: 34.6 g/dL (ref 30.0–36.0)
MCV: 86.5 fL (ref 78.0–100.0)
Platelets: 211 10*3/uL (ref 150–400)
RBC: 2.97 MIL/uL — ABNORMAL LOW (ref 4.22–5.81)
RDW: 14.1 % (ref 11.5–15.5)
WBC: 5.4 10*3/uL (ref 4.0–10.5)

## 2013-04-03 LAB — GLUCOSE, CAPILLARY
Glucose-Capillary: 132 mg/dL — ABNORMAL HIGH (ref 70–99)
Glucose-Capillary: 112 mg/dL — ABNORMAL HIGH (ref 70–99)

## 2013-04-03 MED ORDER — PANTOPRAZOLE SODIUM 40 MG PO TBEC: 40.0000 mg | DELAYED_RELEASE_TABLET | Freq: Two times a day (BID) | ORAL | Status: DC

## 2013-04-03 NOTE — Progress Notes (Signed)
Discharge instructions reviewed with patient and his wife using teach back method and they demonstrated understanding.  Patient stable for discharge home.  Patient's assessment unchanged from this am.  Patient understands follow up appointments and when he would need to notify the MD.  Allayne Butcher [facility]  04/03/2013  12:37 PM

## 2013-04-03 NOTE — Discharge Summary (Signed)
Physician Discharge Summary  Brent [name] WNU:[id number] DOB: [birth year] DOA: 03/28/2013  PCP: Danise Edge, MD  Admit date: 03/28/2013 Discharge date: 04/03/2013  Recommendations for Outpatient Follow-up:  1. Continue Protonix 40 mg twice a day  2. Avoid Aspirin and colchicine until otherwise noted 3. GI office will make an appointment for you to see them in about 1 week or so after discharge  4. Please make sure you notify either PCP or go to ED if bleeding resumes  Discharge Diagnoses:  Principal Problem:   Acute GI bleeding Active Problems:   Diabetes type 2, controlled   HTN (hypertension)   Morbid obesity   DIVERTICULOSIS, COLON   CAD (coronary artery disease)   OSA (obstructive sleep apnea)   Anemia    Discharge Condition: medically stable for discharge home today   Diet recommendation: as tolerated   History of present illness:  57 y.o. male with past medical history of diabetes, CAD, sleep apnea, gout, s/p Roux en Y gastric bypass 2 years ago at [facility] who presented to [facility] ED 03/28/2013 with syncope, BRBPR and melena in addition to drop in hemoglobin by 5 units over teh course of few days. EGD done 03/27/2013 by Dr. Marina Goodell did not reveal acute bleed. Pt is status post 3 units of PRBC since the admission.   Assessment/Plan:   Principal Problem:  Acute lower GI bleeding  - Status post EGD on 12//11/2012  - Status post 3 units of PRBC transfusion since the admission  - NM bleeding scan showed source in upper abdomen. IR will proceed with angio for embolization only if bleeding recurs.  - Was on PPI drip but now on PO protonix BID  - Given 1 U PRBC on 04/02/2013 - appreciate GI following and their recommendations  - instructed to stop using aspirin and colchicine   Active Problems:  Acute blood loss anemia  - Secondary to GI bleed  - Management as above, appreciate GI following  Diabetes type 2, controlled  - A1c in 10/2012 was 6.1 indicating good glycemic control   HTN (hypertension)  - Blood pressure reasonably controlled   Code Status: full code  Family Communication: family not at the bedside   Consultants:  Gastroenterology  Surgery  Interventional radiology Procedures:  EGD 03/27/2013 NM bleeding scan Antibiotics:  None    Signed:  Manson Passey, MD  [facility] 04/03/2013, 11:48 AM  Pager #: [phone]   Discharge Exam: Filed Vitals:   04/03/13 0620  BP: 103/68  Pulse: 75  Temp: 97.6 F (36.4 C)  Resp: 20   Filed Vitals:   04/02/13 2140 04/02/13 2240 04/02/13 2325 04/03/13 0620  BP: 114/63 120/70 116/70 103/68  Pulse:  71 74 75  Temp: 98 F (36.7 C) 97.8 F (36.6 C) 98.1 F (36.7 C) 97.6 F (36.4 C)  TempSrc:  Oral Oral Axillary  Resp: 16 18 16 20   Height:      Weight:      SpO2: 98% 97% 98% 98%    General: Pt is alert, follows commands appropriately, not in acute distress Cardiovascular: Regular rate and rhythm, S1/S2 +, no murmurs, no rubs, no gallops Respiratory: Clear to auscultation bilaterally, no wheezing, no crackles, no rhonchi Abdominal: Soft, non tender, non distended, bowel sounds +, no guarding Extremities: no edema, no cyanosis, pulses palpable bilaterally DP and PT Neuro: Grossly nonfocal  Discharge Instructions  Discharge Orders   Future Appointments Provider Department Dept Phone   04/10/2013 1:30 PM Amy Oswald Hillock, PA-C [facility] [phone]   05/23/2013 7:15 AM Bradd Canary, MD [facility] [phone]   Future Orders Complete By Expires   Call MD for:  difficulty breathing, headache or visual disturbances  As directed    Call MD for:  persistant dizziness or light-headedness  As directed    Call MD for:  persistant nausea and vomiting  As directed    Call MD for:  severe uncontrolled pain  As directed    Diet - low sodium heart healthy  As directed    Discharge instructions  As directed    Comments:     1. Continue  Protonix 40 mg twice a day  2. Avoid Aspirin and colchicine until otherwise noted 3. GI office will make an appointment for you to see them in about 1 week or so after discharge  4. Please make sure you notify either PCP or go to ED if bleeding resumes   Increase activity slowly  As directed        Medication List    STOP taking these medications       aspirin 81 MG tablet     colchicine 0.6 MG tablet     omeprazole 20 MG capsule  Commonly known as:  PRILOSEC      TAKE these medications       CALCIUM CITRATE PO  Take 1,500 mg by mouth daily.     cetirizine 10 MG tablet  Commonly known as:  ZYRTEC  Take 10 mg by mouth daily.     fenofibrate 160 MG tablet  Take 1 tablet (160 mg total) by mouth daily.     fish oil-omega-3 fatty acids 1000 MG capsule  Take 2,400 mg by mouth 2 (two) times daily.     metFORMIN 500 MG tablet  Commonly known as:  GLUCOPHAGE  Take 500 mg by mouth 2 (two) times daily with a meal.     metoprolol tartrate 25 MG tablet  Commonly known as:  LOPRESSOR  TAKE 1/2 TABLET TWO TIMES DAILY     multivitamin tablet  Take 1 tablet by mouth daily.     pantoprazole 40 MG tablet  Commonly known as:  PROTONIX  Take 1 tablet (40 mg total) by mouth 2 (two) times daily.     simvastatin 5 MG tablet  Commonly known as:  ZOCOR  Take 1 tablet (5 mg total) by mouth at bedtime.     Vitamin B-12 1000 MCG Subl  Place 1 each under the tongue daily.     Vitamin D-3 5000 UNITS Tabs  Take 1 tablet by mouth daily.     VITAMIN E COMPLEX PO  Take 1 capsule by mouth daily. Patient unsure of exact dose           Follow-up Information   Follow up with Danise Edge, MD. Schedule an appointment as soon as possible for a visit in 2 weeks.   Specialty:  Family Medicine   Contact information:   [address] [place] [address] [phone]        The results of significant diagnostics from this hospitalization (including imaging, microbiology,  ancillary and laboratory) are listed below for reference.    Significant Diagnostic Studies: Nm Gi Blood Loss 03/29/2013     IMPRESSION: 1. This study is positive. 2. The most likely source of bleeding is felt to be within the epigastric region, either within the stomach/duodenum or within the gastrojejunostomy.   Ct Abdomen  Pelvis W Contrast 03/24/2013    IMPRESSION: Postsurgical changes compatible with gastric bypass. No cause for acute abdominal pain identified.  Bilateral nonobstructing nephrolithiasis.  Fat containing left inguinal hernia.     Microbiology: MRSA PCR SCREENING     Status: None   Collection Time    03/29/13 12:41 AM      Result Value Range Status   MRSA by PCR NEGATIVE  NEGATIVE Final     Labs: Basic Metabolic Panel:  Recent Labs Lab 03/28/13 1810 03/28/13 1857 03/30/13 0336  NA 134* 138 139  K 3.7 3.8 3.7  CL 100 100 110  CO2 23  --  23  GLUCOSE 267* 264* 149*  BUN 27* 26* 10  CREATININE 0.88 1.00 0.90  CALCIUM 8.3*  --  8.1*   Liver Function Tests:  Recent Labs Lab 03/28/13 1810  AST 15  ALT 12  ALKPHOS 35*  BILITOT 0.3  PROT 5.3*  ALBUMIN 3.1*   No results found for this basename: LIPASE, AMYLASE,  in the last 168 hours No results found for this basename: AMMONIA,  in the last 168 hours CBC:  Recent Labs Lab 03/28/13 1810  03/31/13 1222 03/31/13 1555 04/01/13 0415 04/02/13 0441 04/02/13 1448 04/03/13 0405  WBC 6.9  < > 3.7*  --  4.5 4.6 3.9* 5.4  NEUTROABS 3.9  --   --   --   --   --   --   --   HGB 8.0*  < > 8.7* 8.7* 8.7* 8.1* 7.9* 8.9*  HCT 23.0*  < > 26.1* 25.8* 24.9* 23.9* 23.7* 25.7*  MCV 87.1  < > 87.9  --  87.4 87.5 88.1 86.5  PLT 297  < > 194  --  188 195 206 211  < > = values in this interval not displayed. Cardiac Enzymes:  Recent Labs Lab 03/29/13 0143 03/29/13 0734 03/29/13 1340  TROPONINI <0.30 <0.30 <0.30   BNP: BNP (last 3 results) No results found for this basename: PROBNP,  in the last 8760  hours CBG:  Recent Labs Lab 04/02/13 1209 04/02/13 1710 04/02/13 2016 04/02/13 2328 04/03/13 0438  GLUCAP 92 115* 109* 97 132*    Time coordinating discharge: Over 30 minutes

## 2013-04-03 NOTE — Telephone Encounter (Signed)
Please advise 

## 2013-04-03 NOTE — Telephone Encounter (Signed)
Just DC from hospital, has appt in Feb for CPE. Ok to wait till then?

## 2013-04-03 NOTE — Telephone Encounter (Signed)
So he had a GI bleed if he is feeling OK I would like to see him in January with a CBC next week to make sure his anemia does not worsen again. If he is not feeling well we need to see him sooner.

## 2013-04-03 NOTE — Plan of Care (Signed)
Problem: Phase I Progression Outcomes Goal: Hemodynamically stable Outcome: Completed/Met Date Met:  04/03/13 Hemoglobin was 8.9 this am after blood transfusion last night.

## 2013-04-04 ENCOUNTER — Telehealth: Payer: Self-pay | Admitting: Family Medicine

## 2013-04-04 NOTE — Telephone Encounter (Signed)
Patient scheduled hospital follow up for early January. Also, he rescheduled his cpe(that we needed to reschedule him) for early March but is very upset about this. He states that this is the 3rd time our office has had to reschedule this appointment and he states that he will be talking about this to Dr. Abner Greenspan at visit.

## 2013-04-04 NOTE — Telephone Encounter (Signed)
FYI

## 2013-04-04 NOTE — Telephone Encounter (Signed)
Lab order placed and I left a detailed message on vm for patient to call and reschedule his appt

## 2013-04-04 NOTE — Telephone Encounter (Signed)
So is there any way to get him in sooner for his CPE, he is generally a patient gentleman but has been sick? Some time late January or early February would be good timing after his hospital follow up. He will need slightly closer surveillance after his recent GI bleed

## 2013-04-04 NOTE — Telephone Encounter (Signed)
Can you look into this?

## 2013-04-10 ENCOUNTER — Ambulatory Visit: Admitting: Physician Assistant

## 2013-04-17 ENCOUNTER — Ambulatory Visit (INDEPENDENT_AMBULATORY_CARE_PROVIDER_SITE_OTHER): Admitting: Gastroenterology

## 2013-04-17 ENCOUNTER — Encounter: Payer: Self-pay | Admitting: Gastroenterology

## 2013-04-17 ENCOUNTER — Other Ambulatory Visit (INDEPENDENT_AMBULATORY_CARE_PROVIDER_SITE_OTHER)

## 2013-04-17 VITALS — BP 104/70 | HR 84 | Ht 66.5 in | Wt 243.5 lb

## 2013-04-17 DIAGNOSIS — D649 Anemia, unspecified: Secondary | ICD-10-CM

## 2013-04-17 DIAGNOSIS — K922 Gastrointestinal hemorrhage, unspecified: Secondary | ICD-10-CM

## 2013-04-17 LAB — CBC WITH DIFFERENTIAL/PLATELET
Basophils Absolute: 0 10*3/uL (ref 0.0–0.1)
Basophils Relative: 0.9 % (ref 0.0–3.0)
Eosinophils Absolute: 0.2 10*3/uL (ref 0.0–0.7)
Eosinophils Relative: 4.4 % (ref 0.0–5.0)
HCT: 33 % — ABNORMAL LOW (ref 39.0–52.0)
Hemoglobin: 10.9 g/dL — ABNORMAL LOW (ref 13.0–17.0)
Lymphocytes Relative: 30.4 % (ref 12.0–46.0)
Lymphs Abs: 1.5 10*3/uL (ref 0.7–4.0)
MCHC: 32.9 g/dL (ref 30.0–36.0)
MCV: 83.9 fl (ref 78.0–100.0)
Monocytes Absolute: 0.5 10*3/uL (ref 0.1–1.0)
Monocytes Relative: 9.3 % (ref 3.0–12.0)
Neutro Abs: 2.8 10*3/uL (ref 1.4–7.7)
Neutrophils Relative %: 55 % (ref 43.0–77.0)
Platelets: 252 10*3/uL (ref 150.0–400.0)
RBC: 3.94 Mil/uL — ABNORMAL LOW (ref 4.22–5.81)
RDW: 13.2 % (ref 11.5–14.6)
WBC: 5.1 10*3/uL (ref 4.5–10.5)

## 2013-04-17 NOTE — Patient Instructions (Signed)
Please go to the basement level to have your labs drawn.  Continue the Protonix 40 mg twice daily.  We made you a follow up appointment with Dr. [name] on 05-29-2013 at 9:15 am.

## 2013-04-17 NOTE — Progress Notes (Signed)
Agree 

## 2013-04-17 NOTE — Progress Notes (Signed)
04/17/2013 Brent [name] [id number] [birth year]   History of Present Illness:  This is a pleasant 57 year old male who is known to Brent [name].  He is here today for hospital follow-up after a recent upper GI bleed. He had undergone an EGD on December 8 for complaints of upper abdominal pain. 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 He also had a CT scan of the abdomen and pelvis which showed postsurgical changes compatible with gastric bypass but no cause for pain found. He had incidentally noted bilateral nonobstructing nephrolithiasis and a fat-containing left inguinal hernia. He subsequently presented with the GI bleed and was hospitalized. He received 3 units of packed red blood cells during his hospital stay. He had been taking aspirin and prednisone (for gout) at that time, but those medications have since been on hold. He was on a Protonix drip while in the hospital and is now taking Protonix 40 mg twice daily. He had a positive nuclear medicince bleeding scan during his hospitalization and the most likely source of bleeding was felt to be in the epigastric region. It was thought that he likely had a bleed possibly from an ulcer in his gastric remnant, nonvisualized small bowel, or anastomosis. He was not sent to IR and the bleeding eventually stopped spontaneously. He is here today for followup. Says that he feels well. No further signs of bleeding. No abdominal pain. He has not had any labs since his hospital discharge.  Current Medications, Allergies, Past Medical History, Past Surgical History, Family History and Social History were reviewed in [name] record.   Physical Exam: BP 104/70  Pulse 84  Ht 5' 6.5" (1.689 m)  Wt 243 lb 8 oz (110.451 kg)  BMI 38.72 kg/m2 General: Well developed white male in no acute distress Head: Normocephalic and atraumatic Eyes:  Sclerae anicteric, conjunctiva pink   Ears: Normal auditory acuity Lungs: Clear throughout to auscultation Heart: Regular rate and rhythm Abdomen: Soft, non-distended.  Normal bowel sounds.  Non-tender. Musculoskeletal: Symmetrical with no gross deformities.  Extremities: No edema  Neurological: Alert oriented x 4, grossly non-focal Psychological:  Alert and cooperative. Normal mood and affect  Assessment and Recommendations: -Anemia s/p hospitalization for UGIB, likely from ulcer in gastric remnant, non-visualized small bowel, or anastomosis.  He received 3 units of PRBC's while in the hospital.    *Continue Protonix 40 mg BID for now.  Continue to hold ASA.  Will recheck CBC today.  Follow-up with Brent [name] in 6 weeks.

## 2013-04-20 ENCOUNTER — Emergency Department (HOSPITAL_BASED_OUTPATIENT_CLINIC_OR_DEPARTMENT_OTHER)
Admission: EM | Admit: 2013-04-20 | Discharge: 2013-04-20 | Disposition: A | Discharge status: Home / Self Care | Attending: Emergency Medicine | Admitting: Emergency Medicine

## 2013-04-20 ENCOUNTER — Emergency Department (HOSPITAL_BASED_OUTPATIENT_CLINIC_OR_DEPARTMENT_OTHER)

## 2013-04-20 ENCOUNTER — Encounter (HOSPITAL_BASED_OUTPATIENT_CLINIC_OR_DEPARTMENT_OTHER): Payer: Self-pay | Admitting: Emergency Medicine

## 2013-04-20 DIAGNOSIS — N390 Urinary tract infection, site not specified: Secondary | ICD-10-CM | POA: Insufficient documentation

## 2013-04-20 DIAGNOSIS — Z860100 Personal history of colon polyps, unspecified: Secondary | ICD-10-CM | POA: Insufficient documentation

## 2013-04-20 DIAGNOSIS — Z79899 Other long term (current) drug therapy: Secondary | ICD-10-CM | POA: Insufficient documentation

## 2013-04-20 DIAGNOSIS — E119 Type 2 diabetes mellitus without complications: Secondary | ICD-10-CM | POA: Insufficient documentation

## 2013-04-20 DIAGNOSIS — G4730 Sleep apnea, unspecified: Secondary | ICD-10-CM | POA: Insufficient documentation

## 2013-04-20 DIAGNOSIS — K219 Gastro-esophageal reflux disease without esophagitis: Secondary | ICD-10-CM | POA: Insufficient documentation

## 2013-04-20 DIAGNOSIS — I10 Essential (primary) hypertension: Secondary | ICD-10-CM | POA: Insufficient documentation

## 2013-04-20 DIAGNOSIS — Z87891 Personal history of nicotine dependence: Secondary | ICD-10-CM | POA: Insufficient documentation

## 2013-04-20 DIAGNOSIS — E785 Hyperlipidemia, unspecified: Secondary | ICD-10-CM | POA: Insufficient documentation

## 2013-04-20 LAB — CBC WITH DIFFERENTIAL/PLATELET
Basophils Absolute: 0 10*3/uL (ref 0.0–0.1)
Basophils Relative: 1 % (ref 0–1)
Eosinophils Absolute: 0.1 10*3/uL (ref 0.0–0.7)
Eosinophils Relative: 1 % (ref 0–5)
HCT: 32.5 % — ABNORMAL LOW (ref 39.0–52.0)
Hemoglobin: 10.8 g/dL — ABNORMAL LOW (ref 13.0–17.0)
Lymphocytes Relative: 16 % (ref 12–46)
Lymphs Abs: 1 10*3/uL (ref 0.7–4.0)
MCH: 28.3 pg (ref 26.0–34.0)
MCHC: 33.2 g/dL (ref 30.0–36.0)
MCV: 85.3 fL (ref 78.0–100.0)
Monocytes Absolute: 0.8 10*3/uL (ref 0.1–1.0)
Monocytes Relative: 13 % — ABNORMAL HIGH (ref 3–12)
Neutro Abs: 4.4 10*3/uL (ref 1.7–7.7)
Neutrophils Relative %: 69 % (ref 43–77)
Platelets: 211 10*3/uL (ref 150–400)
RBC: 3.81 MIL/uL — ABNORMAL LOW (ref 4.22–5.81)
RDW: 12.6 % (ref 11.5–15.5)
WBC: 6.3 10*3/uL (ref 4.0–10.5)

## 2013-04-20 LAB — BASIC METABOLIC PANEL
BUN: 11 mg/dL (ref 6–23)
CO2: 26 mEq/L (ref 19–32)
Calcium: 9.3 mg/dL (ref 8.4–10.5)
Chloride: 97 mEq/L (ref 96–112)
Creatinine, Ser: 0.9 mg/dL (ref 0.50–1.35)
GFR calc Af Amer: >90 mL/min (ref >90)
GFR calc non Af Amer: >90 mL/min (ref >90)
Glucose, Bld: 170 mg/dL — ABNORMAL HIGH (ref 70–99)
Potassium: 3.9 mEq/L (ref 3.7–5.3)
Sodium: 138 mEq/L (ref 137–147)

## 2013-04-20 LAB — URINALYSIS, ROUTINE W REFLEX MICROSCOPIC
Glucose, UA: NEGATIVE mg/dL
Hgb urine dipstick: NEGATIVE
Ketones, ur: 15 mg/dL — AB
Nitrite: NEGATIVE
Protein, ur: NEGATIVE mg/dL
Specific Gravity, Urine: 1.021 (ref 1.005–1.030)
Urobilinogen, UA: 2 mg/dL — ABNORMAL HIGH (ref 0.0–1.0)
pH: 5.5 (ref 5.0–8.0)

## 2013-04-20 LAB — URINE MICROSCOPIC-ADD ON

## 2013-04-20 MED ORDER — ONDANSETRON HCL 4 MG/2ML IJ SOLN
4.0000 mg | Freq: Once | INTRAMUSCULAR | Status: AC
  Administered 2013-04-20: 4 mg via INTRAVENOUS
  Filled 2013-04-20: qty 2

## 2013-04-20 MED ORDER — DEXTROSE 5 % IV SOLN
1.0000 g | Freq: Once | INTRAVENOUS | Status: AC
  Administered 2013-04-20: 1 g via INTRAVENOUS

## 2013-04-20 MED ORDER — CIPROFLOXACIN HCL 500 MG PO TABS: 500.0000 mg | ORAL_TABLET | Freq: Two times a day (BID) | ORAL | Status: DC

## 2013-04-20 MED ORDER — CEFTRIAXONE SODIUM 1 G IJ SOLR
INTRAMUSCULAR | Status: AC
  Filled 2013-04-20: qty 10

## 2013-04-20 MED ORDER — HYDROMORPHONE HCL PF 1 MG/ML IJ SOLN
0.5000 mg | Freq: Once | INTRAMUSCULAR | Status: AC
  Administered 2013-04-20: 0.5 mg via INTRAVENOUS
  Filled 2013-04-20: qty 1

## 2013-04-20 MED ORDER — OXYCODONE-ACETAMINOPHEN 5-325 MG PO TABS
2.0000 | ORAL_TABLET | ORAL | Status: DC | PRN
Start: 2013-04-20 — End: 2013-11-21

## 2013-04-20 MED ORDER — ONDANSETRON 8 MG PO TBDP: ORAL_TABLET | ORAL | Status: DC

## 2013-04-20 NOTE — Discharge Instructions (Signed)
Urinary Tract Infection  Urinary tract infections (UTIs) can develop anywhere along your urinary tract. Your urinary tract is your body's drainage system for removing wastes and extra water. Your urinary tract includes two kidneys, two ureters, a bladder, and a urethra. Your kidneys are a pair of bean-shaped organs. Each kidney is about the size of your fist. They are located below your ribs, one on each side of your spine.  CAUSES  Infections are caused by microbes, which are microscopic organisms, including fungi, viruses, and bacteria. These organisms are so small that they can only be seen through a microscope. Bacteria are the microbes that most commonly cause UTIs.  SYMPTOMS   Symptoms of UTIs may vary by age and gender of the patient and by the location of the infection. Symptoms in young women typically include a frequent and intense urge to urinate and a painful, burning feeling in the bladder or urethra during urination. Older women and men are more likely to be tired, shaky, and weak and have muscle aches and abdominal pain. A fever may mean the infection is in your kidneys. Other symptoms of a kidney infection include pain in your back or sides below the ribs, nausea, and vomiting.  DIAGNOSIS  To diagnose a UTI, your caregiver will ask you about your symptoms. Your caregiver also will ask to provide a urine sample. The urine sample will be tested for bacteria and white blood cells. White blood cells are made by your body to help fight infection.  TREATMENT   Typically, UTIs can be treated with medication. Because most UTIs are caused by a bacterial infection, they usually can be treated with the use of antibiotics. The choice of antibiotic and length of treatment depend on your symptoms and the type of bacteria causing your infection.  HOME CARE INSTRUCTIONS   If you were prescribed antibiotics, take them exactly as your caregiver instructs you. Finish the medication even if you feel better after you  have only taken some of the medication.   Drink enough water and fluids to keep your urine clear or pale yellow.   Avoid caffeine, tea, and carbonated beverages. They tend to irritate your bladder.   Empty your bladder often. Avoid holding urine for long periods of time.   Empty your bladder before and after sexual intercourse.   After a bowel movement, women should cleanse from front to back. Use each tissue only once.  SEEK MEDICAL CARE IF:    You have back pain.   You develop a fever.   Your symptoms do not begin to resolve within 3 days.  SEEK IMMEDIATE MEDICAL CARE IF:    You have severe back pain or lower abdominal pain.   You develop chills.   You have nausea or vomiting.   You have continued burning or discomfort with urination.  MAKE SURE YOU:    Understand these instructions.   Will watch your condition.   Will get help right away if you are not doing well or get worse.  Document Released: 01/14/2005 Document Revised: 10/06/2011 Document Reviewed: 05/15/2011  [employer] Patient Information 2014 [employer].

## 2013-04-20 NOTE — ED Notes (Signed)
Right flank pain x 2 days

## 2013-04-20 NOTE — ED Provider Notes (Signed)
CSN: [id number]     Arrival date & time 04/20/13  1441 History   First MD Initiated Contact with Patient 04/20/13 1554     Chief Complaint  Patient presents with  . Flank Pain   (Consider location/radiation/quality/duration/timing/severity/associated sxs/prior Treatment) HPI Comments: Patient presents with right flank pain. He states 2 days ago he started having some pain in his right mid back. Today it radiated around to his abdomen. He says it's sharp pain it got more intense today. He's had some nausea associated with the pain today. He denies any urinary symptoms. He does have a history of a fever today which he says was 101 earlier. He has a history of a recent GI bleed earlier in December. He denies any bloody stools or bloody emesis. He is having normal bowel movements. He denies any testicular pain.  Patient is a 58 y.o. male presenting with flank pain.  Flank Pain Associated symptoms include abdominal pain. Pertinent negatives include no chest pain, no headaches and no shortness of breath.    Past Medical History  Diagnosis Date  . DM (diabetes mellitus), type 2, uncontrolled   . Acid reflux disease   . Sleep apnea     a. CPAP  . Hx of colonic polyps   . Diverticulosis of colon   . Hypertension 07/14/2010  . Morbid obesity 03/27/2010    a. 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  . 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 HYPERSOMNIA, ASSOCIATED WITH SLEEP APNEA 07/26/2008  . DIVERTICULOSIS, COLON 10/29/2008  . Diarrhea 06/13/2010  . COLONIC POLYPS, HX OF 10/29/2008  . Benign neoplasm of colon 07/05/2007  . ACID REFLUX DISEASE 07/05/2007  . Knee pain, left 10/10/2010  . Tear of meniscus of left knee 2012  . HTN (hypertension) 07/14/2010  . Breast pain, left 11/17/2011  . Preventative health care 11/17/2011  . Chest pain     a. Reportedly negative dobut echo performed prior to gastric bypass in 03/2011;  b. 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 12/2011 Cath: LM nl, LAD 50p, D1 18m, LCX min irregs,  OM3 30, RCA 25p, 50m (FFR 0.99->0.89), PDA 30, EF 65%, Med Rx.  . Other and unspecified hyperlipidemia 11/16/2012  . Gout 03/04/2013  . GI bleed    Past Surgical History  Procedure Laterality Date  . Ankle surgery  1994  . Tonsillectomy  age 53  . Colonoscopy polyps    . Knee arthroscopy  11/06/10    Left, torn meniscus (repaired)  . Colonoscopy    . Gastric bypass    . Hernia repair    . Upper gi endoscopy  03/27/13  . Esophagogastroduodenoscopy N/A 03/27/2013    Procedure: ESOPHAGOGASTRODUODENOSCOPY (EGD);  Surgeon: Irene Shipper, MD;  Location: [facility];  Service: Endoscopy;  Laterality: N/A;   Family History  Problem Relation Age of Onset  . Diabetes Mother   . Hypertension Mother   . Stroke Mother   . Hyperlipidemia Mother   . Hypertension Father   . Colon polyps Father   . Heart attack Father 58  . Stroke Father   . Heart attack Brother   . Diabetes Brother   . Heart disease Brother   . Heart attack Brother     Multiple  . Diabetes Brother   . Other Brother     heart problems  . Heart disease Brother   . Diabetes Sister   . Obesity Brother   . Diabetes Brother   . Hypertension Maternal Grandmother   .  ADD / ADHD Daughter   . Stomach cancer Neg Hx    History  Substance Use Topics  . Smoking status: Former Smoker -- 1.50 packs/day for 20 years    Types: Cigarettes    Quit date: 04/21/1991  . Smokeless tobacco: Never Used  . Alcohol Use: No     Comment:  occasionaly- social    Review of Systems  Constitutional: Positive for fever. Negative for chills, diaphoresis and fatigue.  HENT: Negative for congestion, rhinorrhea and sneezing.   Eyes: Negative.   Respiratory: Negative for cough, chest tightness and shortness of breath.   Cardiovascular: Negative for chest pain and leg swelling.  Gastrointestinal: Positive for nausea and abdominal pain. Negative for vomiting, diarrhea and blood in stool.  Genitourinary: Positive for flank pain. Negative for  frequency, hematuria and difficulty urinating.  Musculoskeletal: Positive for back pain. Negative for arthralgias.  Skin: Negative for rash.  Neurological: Negative for dizziness, speech difficulty, weakness, numbness and headaches.    Allergies  Bee venom; Lipitor; and Morphine  Home Medications   Current Outpatient Rx  Name  Route  Sig  Dispense  Refill  . CALCIUM CITRATE PO   Oral   Take 1,500 mg by mouth daily.         [name] EXPIRED: cetirizine (ZYRTEC) 10 MG tablet   Oral   Take 10 mg by mouth daily.         . Cholecalciferol (VITAMIN D-3) 5000 UNITS TABS   Oral   Take 1 tablet by mouth daily.         . ciprofloxacin (CIPRO) 500 MG tablet   Oral   Take 1 tablet (500 mg total) by mouth 2 (two) times daily. One po bid x 7 days   14 tablet   0   . Cyanocobalamin (VITAMIN B-12) 1000 MCG SUBL   Sublingual   Place 1 each under the tongue daily.         . fenofibrate 160 MG tablet   Oral   Take 1 tablet (160 mg total) by mouth daily.   90 tablet   2   . fish oil-omega-3 fatty acids 1000 MG capsule   Oral   Take 2,400 mg by mouth 2 (two) times daily.           . metFORMIN (GLUCOPHAGE) 500 MG tablet   Oral   Take 500 mg by mouth 2 (two) times daily with a meal.         . metoprolol tartrate (LOPRESSOR) 25 MG tablet      TAKE 1/2 TABLET TWO TIMES DAILY         . Multiple Vitamin (MULTIVITAMIN) tablet   Oral   Take 2 tablets by mouth daily. BARIATRIC VIT         . ondansetron (ZOFRAN ODT) 8 MG disintegrating tablet      8mg  ODT q4 hours prn nausea   4 tablet   0   . oxyCODONE-acetaminophen (PERCOCET) 5-325 MG per tablet   Oral   Take 2 tablets by mouth every 4 (four) hours as needed.   20 tablet   0   . pantoprazole (PROTONIX) 40 MG tablet   Oral   Take 1 tablet (40 mg total) by mouth 2 (two) times daily.   60 tablet   3   . simvastatin (ZOCOR) 5 MG tablet   Oral   Take 1 tablet (5 mg total) by mouth at bedtime.   90 tablet   2    .  VITAMIN E COMPLEX PO   Oral   Take 1 capsule by mouth daily. Patient unsure of exact dose          BP 126/64  Pulse 98  Temp(Src) 99.7 F (37.6 C) (Oral)  Resp 18  Ht 5\' 7"  (1.702 m)  Wt 243 lb (110.224 kg)  BMI 38.05 kg/m2  SpO2 99% Physical Exam  Constitutional: He is oriented to person, place, and time. He appears well-developed and well-nourished.  HENT:  Head: Normocephalic and atraumatic.  Eyes: Pupils are equal, round, and reactive to light.  Neck: Normal range of motion. Neck supple.  Cardiovascular: Normal rate, regular rhythm and normal heart sounds.   Pulmonary/Chest: Effort normal and breath sounds normal. No respiratory distress. He has no wheezes. He has no rales. He exhibits no tenderness.  Abdominal: Soft. Bowel sounds are normal. There is tenderness (positive tenderness to the right midabdomen. There is no pain to the groin or testicular area). There is no rebound and no guarding.  No CVA tenderness  Musculoskeletal: Normal range of motion. He exhibits no edema.  Lymphadenopathy:    He has no cervical adenopathy.  Neurological: He is alert and oriented to person, place, and time.  Skin: Skin is warm and dry. No rash noted.  Psychiatric: He has a normal mood and affect.    ED Course  Procedures (including critical care time) Labs Review Results for orders placed during the hospital encounter of 04/20/13  URINALYSIS, ROUTINE W REFLEX MICROSCOPIC      Result Value Range   Color, Urine YELLOW  YELLOW   APPearance CLOUDY (*) CLEAR   Specific Gravity, Urine 1.021  1.005 - 1.030   pH 5.5  5.0 - 8.0   Glucose, UA NEGATIVE  NEGATIVE mg/dL   Hgb urine dipstick NEGATIVE  NEGATIVE   Bilirubin Urine SMALL (*) NEGATIVE   Ketones, ur 15 (*) NEGATIVE mg/dL   Protein, ur NEGATIVE  NEGATIVE mg/dL   Urobilinogen, UA 2.0 (*) 0.0 - 1.0 mg/dL   Nitrite NEGATIVE  NEGATIVE   Leukocytes, UA MODERATE (*) NEGATIVE  URINE MICROSCOPIC-ADD ON      Result Value Range    Squamous Epithelial / LPF RARE  RARE   WBC, UA 21-50  <3 WBC/hpf   RBC / HPF 0-2  <3 RBC/hpf   Bacteria, UA MANY (*) RARE   Crystals CA OXALATE CRYSTALS (*) NEGATIVE   Urine-Other MUCOUS PRESENT    CBC WITH DIFFERENTIAL      Result Value Range   WBC 6.3  4.0 - 10.5 K/uL   RBC 3.81 (*) 4.22 - 5.81 MIL/uL   Hemoglobin 10.8 (*) 13.0 - 17.0 g/dL   HCT 32.5 (*) 39.0 - 52.0 %   MCV 85.3  78.0 - 100.0 fL   MCH 28.3  26.0 - 34.0 pg   MCHC 33.2  30.0 - 36.0 g/dL   RDW 12.6  11.5 - 15.5 %   Platelets 211  150 - 400 K/uL   Neutrophils Relative % 69  43 - 77 %   Neutro Abs 4.4  1.7 - 7.7 K/uL   Lymphocytes Relative 16  12 - 46 %   Lymphs Abs 1.0  0.7 - 4.0 K/uL   Monocytes Relative 13 (*) 3 - 12 %   Monocytes Absolute 0.8  0.1 - 1.0 K/uL   Eosinophils Relative 1  0 - 5 %   Eosinophils Absolute 0.1  0.0 - 0.7 K/uL   Basophils Relative 1  0 -  1 %   Basophils Absolute 0.0  0.0 - 0.1 K/uL  BASIC METABOLIC PANEL      Result Value Range   Sodium 138  137 - 147 mEq/L   Potassium 3.9  3.7 - 5.3 mEq/L   Chloride 97  96 - 112 mEq/L   CO2 26  19 - 32 mEq/L   Glucose, Bld 170 (*) 70 - 99 mg/dL   BUN 11  6 - 23 mg/dL   Creatinine, Ser 0.90  0.50 - 1.35 mg/dL   Calcium 9.3  8.4 - 10.5 mg/dL   GFR calc non Af Amer >90  >90 mL/min   GFR calc Af Amer >90  >90 mL/min   Ct Abdomen Pelvis Wo Contrast  04/20/2013   CLINICAL DATA:  Right flank pain.  EXAM: CT ABDOMEN AND PELVIS WITHOUT CONTRAST  TECHNIQUE: Multidetector CT imaging of the abdomen and pelvis was performed following the standard protocol without intravenous contrast.  COMPARISON:  03/24/2013; 03/28/2013  FINDINGS: Linear subsegmental atelectasis observed in the left lower lobe. Prior gastric bypass with gastrojejunostomy. The noncontrast CT appearance of the liver, spleen, pancreas, and adrenal glands is within normal limits. No specific gallbladder or biliary abnormality identified.  Clustered right kidney lower pole calculi, 0.8 x 0.4 cm. No  hydronephrosis or hydroureter. No ureteral calculus observed on either side. There is a 1-2 mm left kidney lower pole nonobstructive calculus.  Mild aortoiliac atherosclerotic vascular disease. Appendix normal. Urinary bladder normal. No prostate gland enlargement.  No pathologic upper abdominal adenopathy is observed. No pathologic pelvic adenopathy is observed.  IMPRESSION: 1. Bilateral nephrolithiasis without evidence of hydronephrosis or ureteral calculus. 2. Prior gastric bypass. 3. Atherosclerosis.   Electronically Signed   By: Sherryl Barters M.D.   On: 04/20/2013 17:05         EKG Interpretation   None       MDM   1. UTI (lower urinary tract infection)    Patient presents with right flank pain and fever. There's no evidence of ureteral stones. He does have evidence of a UTI. He has no vomiting or other signs of sepsis. He was given some pain medication in the ED and is feeling much better after this. He feels like he's ready to go home. He is had no ongoing vomiting. His hemoglobin is similar to his prior value. His renal function is normal. He was given dose Rocephin in the ED and given a prescription for Cipro as well as Percocet and Zofran for symptomatic leg. He was encouraged to have close followup with his primary care physician. He is advised to return here if he has worsening pain vomiting or ongoing fevers.    Malvin Johns, MD 04/20/13 1816

## 2013-04-21 LAB — URINE CULTURE: Colony Count: >=100000

## 2013-04-25 ENCOUNTER — Ambulatory Visit (INDEPENDENT_AMBULATORY_CARE_PROVIDER_SITE_OTHER): Admitting: Family Medicine

## 2013-04-25 ENCOUNTER — Encounter: Payer: Self-pay | Admitting: Family Medicine

## 2013-04-25 VITALS — BP 122/86 | HR 76 | Temp 98.2°F | Ht 67.0 in | Wt 246.0 lb

## 2013-04-25 DIAGNOSIS — K922 Gastrointestinal hemorrhage, unspecified: Secondary | ICD-10-CM

## 2013-04-25 DIAGNOSIS — N200 Calculus of kidney: Secondary | ICD-10-CM

## 2013-04-25 DIAGNOSIS — I10 Essential (primary) hypertension: Secondary | ICD-10-CM

## 2013-04-25 NOTE — Patient Instructions (Signed)

## 2013-04-25 NOTE — Progress Notes (Signed)
Pre visit review using our clinic review tool, if applicable. No additional management support is needed unless otherwise documented below in the visit note. 

## 2013-04-30 ENCOUNTER — Encounter: Payer: Self-pay | Admitting: Family Medicine

## 2013-04-30 DIAGNOSIS — N200 Calculus of kidney: Secondary | ICD-10-CM | POA: Insufficient documentation

## 2013-04-30 NOTE — Assessment & Plan Note (Signed)
Encouraged adequate hydration and referred to urology for further consideration

## 2013-04-30 NOTE — Assessment & Plan Note (Signed)
Good weight loss since surgery slight increase today, will continue to monitor and patient continues to try and adjust diet

## 2013-04-30 NOTE — Progress Notes (Signed)
Patient ID: Brent [name], male   DOB: [birth year], 58 y.o.   MRN: [id number] Brent [name] [id number] July 06, 1955 04/30/2013      Progress Note-Follow Up  Subjective  Chief Complaint  Chief Complaint  Patient presents with  . Follow-up    hospital     HPI  Patient is a 58 year old Caucasian male who is in today for followup after hospitalization. He was hospitalized with acute GI bleed but has not had any further bleeding or bloody stools since released. He also has been hospitalized for kidney stones. Today he has no pain. He denies fevers chills or urinary symptoms. Denies chest pain, palpitations or shortness of breath. Is taking medications as prescribed. Reports his blood sugars have been well-controlled  Past Medical History  Diagnosis Date  . DM (diabetes mellitus), type 2, uncontrolled   . Acid reflux disease   . Sleep apnea     a. CPAP  . Hx of colonic polyps   . Diverticulosis of colon   . Hypertension 07/14/2010  . Morbid obesity 03/27/2010    a. 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  . 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 HYPERSOMNIA, ASSOCIATED WITH SLEEP APNEA 07/26/2008  . DIVERTICULOSIS, COLON 10/29/2008  . Diarrhea 06/13/2010  . COLONIC POLYPS, HX OF 10/29/2008  . Benign neoplasm of colon 07/05/2007  . ACID REFLUX DISEASE 07/05/2007  . Knee pain, left 10/10/2010  . Tear of meniscus of left knee 2012  . HTN (hypertension) 07/14/2010  . Breast pain, left 11/17/2011  . Preventative health care 11/17/2011  . Chest pain     a. Reportedly negative dobut echo performed prior to gastric bypass in 03/2011;  b. 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 12/2011 Cath: LM nl, LAD 50p, D1 39m, LCX min irregs, OM3 30, RCA 25p, 36m (FFR 0.99->0.89), PDA 30, EF 65%, Med Rx.  . Other and unspecified hyperlipidemia 11/16/2012  . Gout 03/04/2013  . GI bleed     Past Surgical History  Procedure Laterality Date  . Ankle surgery  1994  . Tonsillectomy  age 23  . Colonoscopy polyps    . Knee  arthroscopy  11/06/10    Left, torn meniscus (repaired)  . Colonoscopy    . Gastric bypass    . Hernia repair    . Upper gi endoscopy  03/27/13  . Esophagogastroduodenoscopy N/A 03/27/2013    Procedure: ESOPHAGOGASTRODUODENOSCOPY (EGD);  Surgeon: Irene Shipper, MD;  Location: [facility];  Service: Endoscopy;  Laterality: N/A;    Family History  Problem Relation Age of Onset  . Diabetes Mother   . Hypertension Mother   . Stroke Mother   . Hyperlipidemia Mother   . Hypertension Father   . Colon polyps Father   . Heart attack Father 73  . Stroke Father   . Heart attack Brother   . Diabetes Brother   . Heart disease Brother   . Heart attack Brother     Multiple  . Diabetes Brother   . Other Brother     heart problems  . Heart disease Brother   . Diabetes Sister   . Obesity Brother   . Diabetes Brother   . Hypertension Maternal Grandmother   . ADD / ADHD Daughter   . Stomach cancer Neg Hx     History   Social History  . Marital Status: Married    Spouse Name: N/A    Number of Children: 2  . Years of Education: N/A   Occupational  History  . TEACHER    Social History Main Topics  . Smoking status: Former Smoker -- 1.50 packs/day for 20 years    Types: Cigarettes    Quit date: 04/21/1991  . Smokeless tobacco: Never Used  . Alcohol Use: No     Comment:  occasionaly- social  . Drug Use: No  . Sexual Activity: Yes   Other Topics Concern  . Not on file   Social History Narrative   Lives with wife in [place].  Does not routinely exercise.    Current Outpatient Prescriptions on File Prior to Visit  Medication Sig Dispense Refill  . CALCIUM CITRATE PO Take 1,500 mg by mouth daily.      . cetirizine (ZYRTEC) 10 MG tablet Take 10 mg by mouth daily.      . Cholecalciferol (VITAMIN D-3) 5000 UNITS TABS Take 1 tablet by mouth daily.      . ciprofloxacin (CIPRO) 500 MG tablet Take 1 tablet (500 mg total) by mouth 2 (two) times daily. One po bid x 7 days  14 tablet  0   . Cyanocobalamin (VITAMIN B-12) 1000 MCG SUBL Place 1 each under the tongue daily.      . fenofibrate 160 MG tablet Take 1 tablet (160 mg total) by mouth daily.  90 tablet  2  . metFORMIN (GLUCOPHAGE) 500 MG tablet Take 500 mg by mouth 2 (two) times daily with a meal.      . metoprolol tartrate (LOPRESSOR) 25 MG tablet TAKE 1/2 TABLET TWO TIMES DAILY      . Multiple Vitamin (MULTIVITAMIN) tablet Take 2 tablets by mouth daily. BARIATRIC VIT      . ondansetron (ZOFRAN ODT) 8 MG disintegrating tablet 8mg  ODT q4 hours prn nausea  4 tablet  0  . oxyCODONE-acetaminophen (PERCOCET) 5-325 MG per tablet Take 2 tablets by mouth every 4 (four) hours as needed.  20 tablet  0  . pantoprazole (PROTONIX) 40 MG tablet Take 1 tablet (40 mg total) by mouth 2 (two) times daily.  60 tablet  3  . simvastatin (ZOCOR) 5 MG tablet Take 1 tablet (5 mg total) by mouth at bedtime.  90 tablet  2  . [DISCONTINUED] ramipril (ALTACE) 10 MG tablet Take 1 tablet (10 mg total) by mouth daily. 2 tabs po daily  180 tablet  1   No current facility-administered medications on file prior to visit.    Allergies  Allergen Reactions  . Bee Venom Anaphylaxis  . Lipitor [Atorvastatin]     Myalgias, memory changes  . Morphine     hyperactive    Review of Systems  Review of Systems  Constitutional: Positive for malaise/fatigue. Negative for fever.  HENT: Negative for congestion.   Eyes: Negative for discharge.  Respiratory: Negative for shortness of breath.   Cardiovascular: Negative for chest pain, palpitations and leg swelling.  Gastrointestinal: Negative for nausea, abdominal pain, diarrhea, constipation, blood in stool and melena.       No blood seen since hospitalization  Genitourinary: Negative for dysuria.  Musculoskeletal: Negative for falls.  Skin: Negative for rash.  Neurological: Negative for loss of consciousness and headaches.  Endo/Heme/Allergies: Negative for polydipsia.  Psychiatric/Behavioral: Negative  for depression and suicidal ideas. The patient is not nervous/anxious and does not have insomnia.     Objective  BP 122/86  Pulse 76  Temp(Src) 98.2 F (36.8 C) (Oral)  Ht 5\' 7"  (1.702 m)  Wt 246 lb (111.585 kg)  BMI 38.52 kg/m2  SpO2 97%  Physical Exam  Physical Exam  Constitutional: He is oriented to person, place, and time and well-developed, well-nourished, and in no distress. No distress.  HENT:  Head: Normocephalic and atraumatic.  Eyes: Conjunctivae are normal.  Neck: Neck supple. No thyromegaly present.  Cardiovascular: Normal rate, regular rhythm and normal heart sounds.   No murmur heard. Pulmonary/Chest: Effort normal and breath sounds normal. No respiratory distress.  Abdominal: He exhibits no distension and no mass. There is no tenderness.  Musculoskeletal: He exhibits no edema.  Neurological: He is alert and oriented to person, place, and time.  Skin: Skin is warm.  Psychiatric: Memory, affect and judgment normal.    Lab Results  Component Value Date   TSH 1.985 11/16/2012   Lab Results  Component Value Date   WBC 6.3 04/20/2013   HGB 10.8* 04/20/2013   HCT 32.5* 04/20/2013   MCV 85.3 04/20/2013   PLT 211 04/20/2013   Lab Results  Component Value Date   CREATININE 0.90 04/20/2013   BUN 11 04/20/2013   NA 138 04/20/2013   K 3.9 04/20/2013   CL 97 04/20/2013   CO2 26 04/20/2013   Lab Results  Component Value Date   ALT 12 03/28/2013   AST 15 03/28/2013   ALKPHOS 35* 03/28/2013   BILITOT 0.3 03/28/2013   Lab Results  Component Value Date   CHOL 165 11/16/2012   Lab Results  Component Value Date   HDL 45 11/16/2012   Lab Results  Component Value Date   LDLCALC 94 11/16/2012   Lab Results  Component Value Date   TRIG 131 11/16/2012   Lab Results  Component Value Date   CHOLHDL 3.7 11/16/2012     Assessment & Plan  Acute GI bleeding No black or bloody stool since returning home from hospital after GI bleed. Will repeat CBC prior to next visit, is  improving  HTN (hypertension) Well controlled, no changes  Kidney stone Encouraged adequate hydration and referred to urology for further consideration  Morbid obesity Good weight loss since surgery slight increase today, will continue to monitor and patient continues to try and adjust diet

## 2013-04-30 NOTE — Assessment & Plan Note (Signed)
No black or bloody stool since returning home from hospital after GI bleed. Will repeat CBC prior to next visit, is improving

## 2013-04-30 NOTE — Assessment & Plan Note (Signed)
Well controlled, no changes 

## 2013-05-07 ENCOUNTER — Other Ambulatory Visit: Payer: Self-pay | Admitting: Family Medicine

## 2013-05-12 ENCOUNTER — Encounter: Admitting: Family Medicine

## 2013-05-15 ENCOUNTER — Other Ambulatory Visit: Payer: Self-pay | Admitting: Family Medicine

## 2013-05-15 LAB — CBC
HCT: 36 % — ABNORMAL LOW (ref 39.0–52.0)
Hemoglobin: 11.6 g/dL — ABNORMAL LOW (ref 13.0–17.0)
MCH: 26.5 pg (ref 26.0–34.0)
MCHC: 32.2 g/dL (ref 30.0–36.0)
MCV: 82.4 fL (ref 78.0–100.0)
Platelets: 207 10*3/uL (ref 150–400)
RBC: 4.37 MIL/uL (ref 4.22–5.81)
RDW: 13.6 % (ref 11.5–15.5)
WBC: 4 10*3/uL (ref 4.0–10.5)

## 2013-05-15 LAB — IRON AND TIBC
%SAT: 8 % — ABNORMAL LOW (ref 20–55)
Iron: 37 ug/dL — ABNORMAL LOW (ref 42–165)
TIBC: 477 ug/dL — ABNORMAL HIGH (ref 215–435)
UIBC: 440 ug/dL — ABNORMAL HIGH (ref 125–400)

## 2013-05-16 LAB — H. PYLORI ANTIBODY, IGG: H Pylori IgG: <0.4 {ISR}

## 2013-05-17 ENCOUNTER — Encounter: Admitting: Family Medicine

## 2013-05-22 ENCOUNTER — Encounter: Payer: Self-pay | Admitting: Family Medicine

## 2013-05-22 ENCOUNTER — Other Ambulatory Visit: Payer: Self-pay | Admitting: Family Medicine

## 2013-05-22 ENCOUNTER — Ambulatory Visit (INDEPENDENT_AMBULATORY_CARE_PROVIDER_SITE_OTHER): Admitting: Family Medicine

## 2013-05-22 VITALS — BP 122/62 | HR 89 | Temp 98.3°F | Ht 67.0 in | Wt 246.0 lb

## 2013-05-22 DIAGNOSIS — R5381 Other malaise: Secondary | ICD-10-CM

## 2013-05-22 DIAGNOSIS — I10 Essential (primary) hypertension: Secondary | ICD-10-CM

## 2013-05-22 DIAGNOSIS — W57XXXA Bitten or stung by nonvenomous insect and other nonvenomous arthropods, initial encounter: Secondary | ICD-10-CM

## 2013-05-22 DIAGNOSIS — H9190 Unspecified hearing loss, unspecified ear: Secondary | ICD-10-CM

## 2013-05-22 DIAGNOSIS — J209 Acute bronchitis, unspecified: Secondary | ICD-10-CM

## 2013-05-22 DIAGNOSIS — E785 Hyperlipidemia, unspecified: Secondary | ICD-10-CM

## 2013-05-22 DIAGNOSIS — R5383 Other fatigue: Secondary | ICD-10-CM

## 2013-05-22 DIAGNOSIS — Z0000 Encounter for general adult medical examination without abnormal findings: Secondary | ICD-10-CM

## 2013-05-22 DIAGNOSIS — D649 Anemia, unspecified: Secondary | ICD-10-CM

## 2013-05-22 DIAGNOSIS — N200 Calculus of kidney: Secondary | ICD-10-CM

## 2013-05-22 DIAGNOSIS — E119 Type 2 diabetes mellitus without complications: Secondary | ICD-10-CM

## 2013-05-22 DIAGNOSIS — R51 Headache: Secondary | ICD-10-CM

## 2013-05-22 DIAGNOSIS — K922 Gastrointestinal hemorrhage, unspecified: Secondary | ICD-10-CM

## 2013-05-22 DIAGNOSIS — K219 Gastro-esophageal reflux disease without esophagitis: Secondary | ICD-10-CM

## 2013-05-22 LAB — CBC
HCT: 35.6 % — ABNORMAL LOW (ref 39.0–52.0)
Hemoglobin: 11.6 g/dL — ABNORMAL LOW (ref 13.0–17.0)
MCH: 26.4 pg (ref 26.0–34.0)
MCHC: 32.6 g/dL (ref 30.0–36.0)
MCV: 81.1 fL (ref 78.0–100.0)
Platelets: 248 10*3/uL (ref 150–400)
RBC: 4.39 MIL/uL (ref 4.22–5.81)
RDW: 14.1 % (ref 11.5–15.5)
WBC: 7 10*3/uL (ref 4.0–10.5)

## 2013-05-22 MED ORDER — AMOXICILLIN-POT CLAVULANATE 875-125 MG PO TABS: 1.0000 | ORAL_TABLET | Freq: Two times a day (BID) | ORAL | Status: DC

## 2013-05-22 MED ORDER — PANTOPRAZOLE SODIUM 40 MG PO TBEC: 40.0000 mg | DELAYED_RELEASE_TABLET | Freq: Two times a day (BID) | ORAL | Status: DC

## 2013-05-22 MED ORDER — FENOFIBRATE 160 MG PO TABS: 160.0000 mg | ORAL_TABLET | Freq: Every day | ORAL | Status: DC

## 2013-05-22 NOTE — Patient Instructions (Signed)

## 2013-05-22 NOTE — Progress Notes (Signed)
Pre visit review using our clinic review tool, if applicable. No additional management support is needed unless otherwise documented below in the visit note. 

## 2013-05-23 ENCOUNTER — Encounter: Admitting: Family Medicine

## 2013-05-23 ENCOUNTER — Telehealth: Payer: Self-pay | Admitting: Family Medicine

## 2013-05-23 LAB — RENAL FUNCTION PANEL
Albumin: 4.3 g/dL (ref 3.5–5.2)
BUN: 18 mg/dL (ref 6–23)
CO2: 26 mEq/L (ref 19–32)
Calcium: 10 mg/dL (ref 8.4–10.5)
Chloride: 107 mEq/L (ref 96–112)
Creat: 0.93 mg/dL (ref 0.50–1.35)
Glucose, Bld: 126 mg/dL — ABNORMAL HIGH (ref 70–99)
Phosphorus: 3.5 mg/dL (ref 2.3–4.6)
Potassium: 4.4 mEq/L (ref 3.5–5.3)
Sodium: 142 mEq/L (ref 135–145)

## 2013-05-23 LAB — PSA: PSA: 1.16 ng/mL (ref <=4.00)

## 2013-05-23 LAB — LIPID PANEL
Cholesterol: 132 mg/dL (ref 0–200)
HDL: 37 mg/dL — ABNORMAL LOW (ref >39)
LDL Cholesterol: 55 mg/dL (ref 0–99)
Total CHOL/HDL Ratio: 3.6 Ratio
Triglycerides: 200 mg/dL — ABNORMAL HIGH (ref <150)
VLDL: 40 mg/dL (ref 0–40)

## 2013-05-23 LAB — HEMOGLOBIN A1C
Hgb A1c MFr Bld: 7.1 % — ABNORMAL HIGH (ref <5.7)
Mean Plasma Glucose: 157 mg/dL — ABNORMAL HIGH (ref <117)

## 2013-05-23 LAB — VITAMIN D 25 HYDROXY (VIT D DEFICIENCY, FRACTURES): Vit D, 25-Hydroxy: 52 ng/mL (ref 30–89)

## 2013-05-23 LAB — VITAMIN B12: Vitamin B-12: 1715 pg/mL — ABNORMAL HIGH (ref 211–911)

## 2013-05-23 LAB — TSH: TSH: 1.014 u[IU]/mL (ref 0.350–4.500)

## 2013-05-23 LAB — MAGNESIUM: Magnesium: 1.5 mg/dL (ref 1.5–2.5)

## 2013-05-23 NOTE — Telephone Encounter (Signed)
Relevant patient education assigned to patient using Emmi. ° °

## 2013-05-24 ENCOUNTER — Encounter: Payer: Self-pay | Admitting: Family Medicine

## 2013-05-24 DIAGNOSIS — H9190 Unspecified hearing loss, unspecified ear: Secondary | ICD-10-CM

## 2013-05-24 DIAGNOSIS — Z0000 Encounter for general adult medical examination without abnormal findings: Secondary | ICD-10-CM | POA: Insufficient documentation

## 2013-05-24 HISTORY — DX: Unspecified hearing loss, unspecified ear: H91.90

## 2013-05-24 NOTE — Assessment & Plan Note (Signed)
No recent flares, maintain adequate hydration

## 2013-05-24 NOTE — Assessment & Plan Note (Signed)
Good weight loss since bypass. Encouraged DASH and/or heart healthy diet. Exercise as tolerated

## 2013-05-24 NOTE — Assessment & Plan Note (Signed)
Tolerating zocor and fenofibrate. Avoid trans fats.

## 2013-05-24 NOTE — Progress Notes (Signed)
Patient ID: Brent [name], male   DOB: 10-12-1955, 58 y.o.   MRN: [id number] Brent [name] [id number] 1956-04-18 05/24/2013      Progress Note-Follow Up  Subjective  Chief Complaint  Chief Complaint  Patient presents with  . Annual Exam    physical    HPI  Patient is a 58 year old Caucasian male who is in today for routine annual exam. She feels well today until technologist she has been struggling with some mild cold symptoms. He believes they're improving. He has been using nasal saline flushes with good results. Was having significant green sputum production but that has improved. Denies any fevers or chills. No headache or chest pain. No shortness or breath GI or GU concerns. Has done well status post his gastric bypass surgery.  Past Medical History  Diagnosis Date  . DM (diabetes mellitus), type 2, uncontrolled   . Acid reflux disease   . Sleep apnea     a. CPAP  . Hx of colonic polyps   . Diverticulosis of colon   . Hypertension 07/14/2010  . Morbid obesity 03/27/2010    a. 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  . 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 HYPERSOMNIA, ASSOCIATED WITH SLEEP APNEA 07/26/2008  . DIVERTICULOSIS, COLON 10/29/2008  . Diarrhea 06/13/2010  . COLONIC POLYPS, HX OF 10/29/2008  . Benign neoplasm of colon 07/05/2007  . ACID REFLUX DISEASE 07/05/2007  . Knee pain, left 10/10/2010  . Tear of meniscus of left knee 2012  . HTN (hypertension) 07/14/2010  . Breast pain, left 11/17/2011  . Preventative health care 11/17/2011  . Chest pain     a. Reportedly negative dobut echo performed prior to gastric bypass in 03/2011;  b. 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 12/2011 Cath: LM nl, LAD 50p, D1 83m, LCX min irregs, OM3 30, RCA 25p, 95m (FFR 0.99->0.89), PDA 30, EF 65%, Med Rx.  . Other and unspecified hyperlipidemia 11/16/2012  . Gout 03/04/2013  . GI bleed   . Atherosclerosis   . Anemia   . Hearing loss 05/24/2013    Previous audiology evaluation completely.    Past  Surgical History  Procedure Laterality Date  . Ankle surgery  1994  . Tonsillectomy  age 57  . Colonoscopy polyps    . Knee arthroscopy  11/06/10    Left, torn meniscus (repaired)  . Colonoscopy    . Gastric bypass    . Hernia repair    . Upper gi endoscopy  03/27/13  . Esophagogastroduodenoscopy N/A 03/27/2013    Procedure: ESOPHAGOGASTRODUODENOSCOPY (EGD);  Surgeon: Irene Shipper, MD;  Location: [facility];  Service: Endoscopy;  Laterality: N/A;    Family History  Problem Relation Age of Onset  . Diabetes Mother   . Hypertension Mother   . Stroke Mother   . Hyperlipidemia Mother   . Hypertension Father   . Colon polyps Father   . Heart attack Father 69  . Stroke Father   . Heart attack Brother   . Diabetes Brother   . Heart disease Brother   . Heart attack Brother     Multiple  . Diabetes Brother   . Other Brother     heart problems  . Heart disease Brother   . Diabetes Sister   . Obesity Brother   . Diabetes Brother   . Hypertension Maternal Grandmother   . ADD / ADHD Daughter   . Stomach cancer Neg Hx     History   Social History  .  Marital Status: Married    Spouse Name: N/A    Number of Children: 2  . Years of Education: N/A   Occupational History  . TEACHER    Social History Main Topics  . Smoking status: Former Smoker -- 1.50 packs/day for 20 years    Types: Cigarettes    Quit date: 04/21/1991  . Smokeless tobacco: Never Used  . Alcohol Use: No     Comment:  occasionaly- social  . Drug Use: No  . Sexual Activity: Yes   Other Topics Concern  . Not on file   Social History Narrative   Lives with wife in [place].  Does not routinely exercise.    Current Outpatient Prescriptions on File Prior to Visit  Medication Sig Dispense Refill  . CALCIUM CITRATE PO Take 1,500 mg by mouth daily.      . cetirizine (ZYRTEC) 10 MG tablet Take 10 mg by mouth daily.      . Cholecalciferol (VITAMIN D-3) 5000 UNITS TABS Take 1 tablet by mouth daily.      .  Cyanocobalamin (VITAMIN B-12) 1000 MCG SUBL Place 1 each under the tongue daily.      . metFORMIN (GLUCOPHAGE) 500 MG tablet Take 500 mg by mouth 2 (two) times daily with a meal.      . metoprolol tartrate (LOPRESSOR) 25 MG tablet TAKE 1/2 TABLET TWO TIMES DAILY      . Multiple Vitamin (MULTIVITAMIN) tablet Take 2 tablets by mouth daily. BARIATRIC VIT      . ondansetron (ZOFRAN ODT) 8 MG disintegrating tablet 8mg  ODT q4 hours prn nausea  4 tablet  0  . oxyCODONE-acetaminophen (PERCOCET) 5-325 MG per tablet Take 2 tablets by mouth every 4 (four) hours as needed.  20 tablet  0  . simvastatin (ZOCOR) 5 MG tablet Take 1 tablet (5 mg total) by mouth at bedtime.  90 tablet  2  . [DISCONTINUED] ramipril (ALTACE) 10 MG tablet Take 1 tablet (10 mg total) by mouth daily. 2 tabs po daily  180 tablet  1   No current facility-administered medications on file prior to visit.    Allergies  Allergen Reactions  . Bee Venom Anaphylaxis  . Lipitor [Atorvastatin]     Myalgias, memory changes  . Morphine     hyperactive    Review of Systems  Review of Systems  Constitutional: Negative for fever and malaise/fatigue.  HENT: Negative for congestion.   Eyes: Negative for discharge.  Respiratory: Negative for shortness of breath.   Cardiovascular: Negative for chest pain, palpitations and leg swelling.  Gastrointestinal: Negative for nausea, abdominal pain and diarrhea.  Genitourinary: Negative for dysuria.  Musculoskeletal: Negative for falls.  Skin: Negative for rash.  Neurological: Negative for loss of consciousness and headaches.  Endo/Heme/Allergies: Negative for polydipsia.  Psychiatric/Behavioral: Negative for depression and suicidal ideas. The patient is not nervous/anxious and does not have insomnia.     Objective  BP 122/62  Pulse 89  Temp(Src) 98.3 F (36.8 C) (Oral)  Ht 5\' 7"  (1.702 m)  Wt 246 lb (111.585 kg)  BMI 38.52 kg/m2  SpO2 97%  Physical Exam  Physical Exam   Constitutional: He is oriented to person, place, and time and well-developed, well-nourished, and in no distress. No distress.  HENT:  Head: Normocephalic and atraumatic.  Eyes: Conjunctivae are normal.  Neck: Neck supple. No thyromegaly present.  Cardiovascular: Normal rate, regular rhythm and normal heart sounds.   No murmur heard. Pulmonary/Chest: Effort normal and breath sounds normal.  No respiratory distress. He has no wheezes.  Abdominal: Soft. He exhibits no distension and no mass. There is no tenderness.  Musculoskeletal: Normal range of motion. He exhibits no edema and no tenderness.  Neurological: He is alert and oriented to person, place, and time.  Skin: Skin is warm.  Psychiatric: Memory, affect and judgment normal.    Lab Results  Component Value Date   TSH 1.014 05/22/2013   Lab Results  Component Value Date   WBC 7.0 05/22/2013   HGB 11.6* 05/22/2013   HCT 35.6* 05/22/2013   MCV 81.1 05/22/2013   PLT 248 05/22/2013   Lab Results  Component Value Date   CREATININE 0.93 05/22/2013   BUN 18 05/22/2013   NA 142 05/22/2013   K 4.4 05/22/2013   CL 107 05/22/2013   CO2 26 05/22/2013   Lab Results  Component Value Date   ALT 12 03/28/2013   AST 15 03/28/2013   ALKPHOS 35* 03/28/2013   BILITOT 0.3 03/28/2013   Lab Results  Component Value Date   CHOL 132 05/22/2013   Lab Results  Component Value Date   HDL 37* 05/22/2013   Lab Results  Component Value Date   LDLCALC 55 05/22/2013   Lab Results  Component Value Date   TRIG 200* 05/22/2013   Lab Results  Component Value Date   CHOLHDL 3.6 05/22/2013     Assessment & Plan  Morbid obesity Good weight loss since bypass. Encouraged DASH and/or heart healthy diet. Exercise as tolerated  Anemia Anemia improving, still iron deficient continue iron supplementation.   Diabetes type 2, controlled hgba1c 7.1 minimize siple carbs, continue current meds.   Other and unspecified hyperlipidemia Tolerating zocor and fenofibrate. Avoid  trans fats.  Kidney stone No recent flares, maintain adequate hydration  HTN (hypertension) Well controlled no changes  GIB (gastrointestinal bleeding) No further episodes, anemia improving  Preventative health care Obtained and reviewed labs. Encouraged heart healthy diet and regular exercise and adeaute sleep.

## 2013-05-24 NOTE — Assessment & Plan Note (Signed)
No further episodes, anemia improving

## 2013-05-24 NOTE — Assessment & Plan Note (Signed)
Well controlled no changes 

## 2013-05-24 NOTE — Assessment & Plan Note (Signed)
hgba1c 7.1 minimize siple carbs, continue current meds.

## 2013-05-24 NOTE — Assessment & Plan Note (Signed)
Obtained and reviewed labs. Encouraged heart healthy diet and regular exercise and adeaute sleep.

## 2013-05-24 NOTE — Assessment & Plan Note (Signed)
Anemia improving, still iron deficient continue iron supplementation.

## 2013-05-25 ENCOUNTER — Telehealth: Payer: Self-pay

## 2013-05-25 NOTE — Telephone Encounter (Signed)
Relevant patient education assigned to patient using Emmi. ° °

## 2013-05-26 ENCOUNTER — Other Ambulatory Visit: Payer: Self-pay | Admitting: Family Medicine

## 2013-05-26 NOTE — Telephone Encounter (Signed)
Rx request to pharmacy/[facility]  

## 2013-05-29 ENCOUNTER — Encounter: Payer: Self-pay | Admitting: Internal Medicine

## 2013-05-29 ENCOUNTER — Ambulatory Visit (INDEPENDENT_AMBULATORY_CARE_PROVIDER_SITE_OTHER): Admitting: Internal Medicine

## 2013-05-29 VITALS — BP 120/64 | HR 76 | Ht 67.0 in | Wt 245.6 lb

## 2013-05-29 DIAGNOSIS — Z860100 Personal history of colon polyps, unspecified: Secondary | ICD-10-CM

## 2013-05-29 DIAGNOSIS — Z9884 Bariatric surgery status: Secondary | ICD-10-CM

## 2013-05-29 DIAGNOSIS — K922 Gastrointestinal hemorrhage, unspecified: Secondary | ICD-10-CM

## 2013-05-29 DIAGNOSIS — R1013 Epigastric pain: Secondary | ICD-10-CM

## 2013-05-29 DIAGNOSIS — D649 Anemia, unspecified: Secondary | ICD-10-CM

## 2013-05-29 NOTE — Progress Notes (Signed)
HISTORY OF PRESENT ILLNESS:  Brent [name] is a 58 y.o. male with multiple medical problems as listed below. He is status post Roux-en-Y gastric bypass for morbid obesity. He was evaluated in late November for abdominal pain. Negative CT and imaging. Subsequent diagnostic upper endoscopy 03/27/2013 was unremarkable post operative exam. He was subsequently admitted to the hospital the following day with acute GI bleeding, syncope, that required ICU hospitalization. Nuclear medicine scan was positive for bleeding in the epigastric region. Considerations included peptic ulcer or downstream anastomotic ulcer out of the reach of the endoscope. He was treated supportively with PPI, blood transfusion, and discontinuation of NSAIDs. Fortunately, he stabilized and was discharged. He was seen in followup late December and doing well. He takes iron supplement every other day and has been on PPI twice daily. No aspirin or other NSAIDs. Today he reports feeling quite well. No complaints. Normal bowels. Last hemoglobin from January 26 was 11.6.  REVIEW OF SYSTEMS:  All non-GI ROS negative upon review  Past Medical History  Diagnosis Date  . DM (diabetes mellitus), type 2, uncontrolled   . Acid reflux disease   . Sleep apnea     a. CPAP  . Hx of colonic polyps   . Diverticulosis of colon   . Hypertension 07/14/2010  . Morbid obesity 03/27/2010    a. 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  . 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 HYPERSOMNIA, ASSOCIATED WITH SLEEP APNEA 07/26/2008  . DIVERTICULOSIS, COLON 10/29/2008  . Diarrhea 06/13/2010  . COLONIC POLYPS, HX OF 10/29/2008  . Benign neoplasm of colon 07/05/2007  . ACID REFLUX DISEASE 07/05/2007  . Knee pain, left 10/10/2010  . Tear of meniscus of left knee 2012  . HTN (hypertension) 07/14/2010  . Breast pain, left 11/17/2011  . Preventative health care 11/17/2011  . Chest pain     a. Reportedly negative dobut echo performed prior to gastric bypass in 03/2011;  b. CTA  12/2011 Mod Mid RCA stenosis;  c. 12/2011 Cath: LM nl, LAD 50p, D1 71m, LCX min irregs, OM3 30, RCA 25p, 28m (FFR 0.99->0.89), PDA 30, EF 65%, Med Rx.  . Other and unspecified hyperlipidemia 11/16/2012  . Gout 03/04/2013  . GI bleed   . Atherosclerosis   . Anemia   . Hearing loss 05/24/2013    Previous audiology evaluation completely.    Past Surgical History  Procedure Laterality Date  . Ankle surgery  1994  . Tonsillectomy  age 30  . Colonoscopy polyps    . Knee arthroscopy  11/06/10    Left, torn meniscus (repaired)  . Colonoscopy    . Gastric bypass    . Hernia repair    . Upper gi endoscopy  03/27/13  . Esophagogastroduodenoscopy N/A 03/27/2013    Procedure: ESOPHAGOGASTRODUODENOSCOPY (EGD);  Surgeon: Irene Shipper, MD;  Location: [facility];  Service: Endoscopy;  Laterality: N/A;    Social History Brent [name]  reports that he quit smoking about 22 years ago. His smoking use included Cigarettes. He has a 30 pack-year smoking history. He has never used smokeless tobacco. He reports that he drinks alcohol. He reports that he does not use illicit drugs.  family history includes ADD / ADHD in his daughter; Colon polyps in his father; Diabetes in his brother, brother, brother, mother, and sister; Heart attack in his brother and brother; Heart attack (age of onset: 24) in his father; Heart disease in his brother and brother; Hyperlipidemia in his mother; Hypertension in his father,  maternal grandmother, and mother; Obesity in his brother; Other in his brother; Stroke in his father and mother. There is no history of Stomach cancer.  Allergies  Allergen Reactions  . Bee Venom Anaphylaxis  . Lipitor [Atorvastatin]     Myalgias, memory changes  . Morphine     hyperactive       PHYSICAL EXAMINATION: Vital signs: BP 120/64  Pulse 76  Ht 5\' 7"  (1.702 m)  Wt 245 lb 9.6 oz (111.403 kg)  BMI 38.46 kg/m2 General: Well-developed, well-nourished, no acute distress HEENT: Sclerae are  anicteric, conjunctiva pink. Oral mucosa intact Lungs: Clear Heart: Regular Abdomen: soft, obese, nontender, nondistended, no obvious ascites, no peritoneal signs, normal bowel sounds. No organomegaly. Extremities: No edema Psychiatric: alert and oriented x3. Cooperative     ASSESSMENT:  #1. Status post Roux-en-Y gastric bypass surgery #2. Hospitalization December 2014 for acute GI bleeding presumably secondary to peptic ulcer or downstream anastomotic ulcer. Negative EGD at that time. Treated medically. No recurrence #3. History of adenomatous colon polyps. Last colonoscopy December 2012   PLAN:  #1. Continue PPI. May decrease to once daily. Would continue indefinitely #2. Continue to avoid aspirin and NSAIDs unless significant medical reason identified #3. Continue iron supplement #4. Surveillance colonoscopy around December 2017. Interval followup as needed

## 2013-05-29 NOTE — Patient Instructions (Signed)
Please follow up with Dr. Perry as needed 

## 2013-06-20 ENCOUNTER — Encounter: Admitting: Family Medicine

## 2013-07-13 ENCOUNTER — Ambulatory Visit (INDEPENDENT_AMBULATORY_CARE_PROVIDER_SITE_OTHER): Admitting: Pulmonary Disease

## 2013-07-13 ENCOUNTER — Encounter: Payer: Self-pay | Admitting: Pulmonary Disease

## 2013-07-13 VITALS — BP 138/86 | HR 78 | Temp 97.9°F | Ht 68.5 in | Wt 244.8 lb

## 2013-07-13 DIAGNOSIS — G4733 Obstructive sleep apnea (adult) (pediatric): Secondary | ICD-10-CM

## 2013-07-13 NOTE — Progress Notes (Signed)
   Subjective:    Patient ID: Brent [name], male    DOB: 1956-02-05, 58 y.o.   MRN: [id number]  HPI  58 y.o obese, diabetic with severe obstructive sleep apnea .  07/2008>> severe obstructive sleep apnea with predominant hypopneas causing sleep fragmentation & hypoxia, AHI 33/h, lowest desatn 84%.  Central apneas emerged on low levels of CPAP ? complex sleep apnea, did not tolerate cycling of BiPAP,final level tried was 9/5 with 5 central apneas & 1 mixed apnea in 5 mins of sleep   Underwent gastric bypass-2013 - lost from 331 to 249  Changed from Bipap to CPAP 11 cm Download 06/2012 -CPAP 11 cm very effective , Minimal leak  Acceptable usage   07/13/2013  Pt states he wears his CPAP everynight. varies in hrs. pt states he feels rested during the day. Feels like the pressure is sitll fine. He would be eligible for a new cpap in may No dryness, mask ok Wt has plateau'd at 244 lbs   Review of Systems neg for any significant sore throat, dysphagia, itching, sneezing, nasal congestion or excess/ purulent secretions, fever, chills, sweats, unintended wt loss, pleuritic or exertional cp, hempoptysis, orthopnea pnd or change in chronic leg swelling. Also denies presyncope, palpitations, heartburn, abdominal pain, nausea, vomiting, diarrhea or change in bowel or urinary habits, dysuria,hematuria, rash, arthralgias, visual complaints, headache, numbness weakness or ataxia.     Objective:   Physical Exam  Gen. Pleasant, obese, in no distress ENT - no lesions, no post nasal drip Neck: No JVD, no thyromegaly, no carotid bruits Lungs: no use of accessory muscles, no dullness to percussion, decreased without rales or rhonchi  Cardiovascular: Rhythm regular, heart sounds  normal, no murmurs or gallops, no peripheral edema Musculoskeletal: No deformities, no cyanosis or clubbing , no tremors       Assessment & Plan:

## 2013-07-13 NOTE — Patient Instructions (Signed)
CPAP supplies will be renewed for a year 

## 2013-07-13 NOTE — Assessment & Plan Note (Signed)
Ct CPAP 11 cm If he gets new CPAP, will chk download  Weight loss encouraged, compliance with goal of at least 4-6 hrs every night is the expectation. Advised against medications with sedative side effects Cautioned against driving when sleepy - understanding that sleepiness will vary on a day to day basis

## 2013-08-07 ENCOUNTER — Other Ambulatory Visit: Payer: Self-pay | Admitting: Family Medicine

## 2013-08-18 LAB — HM DIABETES EYE EXAM

## 2013-09-25 ENCOUNTER — Emergency Department (INDEPENDENT_AMBULATORY_CARE_PROVIDER_SITE_OTHER)
Admission: EM | Admit: 2013-09-25 | Discharge: 2013-09-25 | Disposition: A | Discharge status: Home / Self Care | Source: Home / Self Care | Attending: Family Medicine | Admitting: Family Medicine

## 2013-09-25 ENCOUNTER — Encounter: Payer: Self-pay | Admitting: Emergency Medicine

## 2013-09-25 DIAGNOSIS — R300 Dysuria: Secondary | ICD-10-CM

## 2013-09-25 LAB — POCT CBC W AUTO DIFF (K'VILLE URGENT CARE)

## 2013-09-25 LAB — POCT URINALYSIS DIP (MANUAL ENTRY)
Bilirubin, UA: NEGATIVE
Glucose, UA: NEGATIVE
Ketones, POC UA: NEGATIVE
Nitrite, UA: NEGATIVE
Protein Ur, POC: 30
Spec Grav, UA: 1.025 (ref 1.005–1.03)
Urobilinogen, UA: 0.2 (ref 0–1)
pH, UA: 5.5 (ref 5–8)

## 2013-09-25 MED ORDER — CIPROFLOXACIN HCL 500 MG PO TABS: ORAL_TABLET | ORAL | Status: DC

## 2013-09-25 NOTE — ED Provider Notes (Signed)
CSN: [id number]     Arrival date & time 09/25/13  1427 History   First MD Initiated Contact with Patient 09/25/13 1449     Chief Complaint  Patient presents with  . Hematuria  . Abdominal Pain  . Back Pain      HPI Comments: Yesterday patient developed dark urine, mild dysuria, right low back ache, and bladder pressure/pain. Today his urine was darker.  He denies fevers, chills, and sweats.  He has a history of kidney stones and developed a UTI in January.  Patient is a 58 y.o. male presenting with abdominal pain. The history is provided by the patient.  Abdominal Pain This is a new problem. The current episode started 2 days ago. The problem occurs constantly. The problem has not changed since onset.Associated symptoms include abdominal pain. Pertinent negatives include no headaches. Nothing aggravates the symptoms. Nothing relieves the symptoms. He has tried nothing for the symptoms.    Past Medical History  Diagnosis Date  . DM (diabetes mellitus), type 2, uncontrolled   . Acid reflux disease   . Sleep apnea     a. CPAP  . Hx of colonic polyps   . Diverticulosis of colon   . Hypertension 07/14/2010  . Morbid obesity 03/27/2010    a. 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  . 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 HYPERSOMNIA, ASSOCIATED WITH SLEEP APNEA 07/26/2008  . DIVERTICULOSIS, COLON 10/29/2008  . Diarrhea 06/13/2010  . COLONIC POLYPS, HX OF 10/29/2008  . Benign neoplasm of colon 07/05/2007  . ACID REFLUX DISEASE 07/05/2007  . Knee pain, left 10/10/2010  . Tear of meniscus of left knee 2012  . HTN (hypertension) 07/14/2010  . Breast pain, left 11/17/2011  . Preventative health care 11/17/2011  . Chest pain     a. Reportedly negative dobut echo performed prior to gastric bypass in 03/2011;  b. 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 12/2011 Cath: LM nl, LAD 50p, D1 25m, LCX min irregs, OM3 30, RCA 25p, 48m (FFR 0.99->0.89), PDA 30, EF 65%, Med Rx.  . Other and unspecified hyperlipidemia 11/16/2012   . Gout 03/04/2013  . GI bleed   . Atherosclerosis   . Anemia   . Hearing loss 05/24/2013    Previous audiology evaluation completely.   Past Surgical History  Procedure Laterality Date  . Ankle surgery  1994  . Tonsillectomy  age 46  . Colonoscopy polyps    . Knee arthroscopy  11/06/10    Left, torn meniscus (repaired)  . Colonoscopy    . Gastric bypass    . Hernia repair    . Upper gi endoscopy  03/27/13  . Esophagogastroduodenoscopy N/A 03/27/2013    Procedure: ESOPHAGOGASTRODUODENOSCOPY (EGD);  Surgeon: Irene Shipper, MD;  Location: [facility];  Service: Endoscopy;  Laterality: N/A;   Family History  Problem Relation Age of Onset  . Diabetes Mother   . Hypertension Mother   . Stroke Mother   . Hyperlipidemia Mother   . Hypertension Father   . Colon polyps Father   . Heart attack Father 53  . Stroke Father   . Heart attack Brother   . Diabetes Brother   . Heart disease Brother   . Heart attack Brother     Multiple  . Diabetes Brother   . Other Brother     heart problems  . Heart disease Brother   . Diabetes Sister   . Obesity Brother   . Diabetes Brother   . Hypertension  Maternal Grandmother   . ADD / ADHD Daughter   . Stomach cancer Neg Hx    History  Substance Use Topics  . Smoking status: Former Smoker -- 1.50 packs/day for 20 years    Types: Cigarettes    Quit date: 04/21/1991  . Smokeless tobacco: Never Used  . Alcohol Use: 0.0 oz/week    0 drink(s) per week     Comment:  occasionaly- social    Review of Systems  Constitutional: Negative for fever, chills and fatigue.  HENT: Negative.   Eyes: Negative.   Respiratory: Negative.   Cardiovascular: Negative.   Gastrointestinal: Positive for abdominal pain. Negative for nausea and vomiting.  Genitourinary: Positive for dysuria and urgency. Negative for hematuria, flank pain, discharge, difficulty urinating, penile pain and testicular pain.  Musculoskeletal: Positive for back pain.  Skin: Negative.    Neurological: Negative for headaches.    Allergies  Bee venom; Lipitor; and Morphine  Home Medications   Prior to Admission medications   Medication Sig Start Date End Date Taking? Authorizing Provider  B Complex Vitamins (VITAMIN-B COMPLEX) TABS Place 1 tablet under the tongue daily.    Historical Provider, MD  CALCIUM CITRATE PO Take 1,500 mg by mouth daily.    Historical Provider, MD  cetirizine (ZYRTEC) 10 MG tablet Take 10 mg by mouth daily. 11/17/11 05/22/13  [name], MD  Cholecalciferol (VITAMIN D-3) 5000 UNITS TABS Take 1 tablet by mouth daily.    Historical Provider, MD  ciprofloxacin (CIPRO) 500 MG tablet Take one tab po q12 hours 09/25/13   Kandra Nicolas, MD  Cyanocobalamin (VITAMIN B-12) 1000 MCG SUBL Place 1 each under the tongue daily.    Historical Provider, MD  fenofibrate 160 MG tablet Take 1 tablet (160 mg total) by mouth daily. 05/22/13   [name], MD  Ferrous Sulfate (RA IRON) 27 MG TABS Take 1 tablet by mouth. 3 times per week.    Historical Provider, MD  Javier Docker Oil 1000 MG CAPS Take 2 tablets by mouth daily.    Historical Provider, MD  metFORMIN (GLUCOPHAGE) 500 MG tablet Take 500 mg by mouth 2 (two) times daily with a meal. 02/06/13   [name], MD  metoprolol tartrate (LOPRESSOR) 25 MG tablet TAKE ONE-HALF (1/2) TABLET (12.5 MG TOTAL) TWICE A DAY 08/07/13   [name], MD  Multiple Vitamin (MULTIVITAMIN) tablet Take 2 tablets by mouth daily. BARIATRIC VIT    Historical Provider, MD  ondansetron (ZOFRAN ODT) 8 MG disintegrating tablet 8mg  ODT q4 hours prn nausea 04/20/13   Malvin Johns, MD  oxyCODONE-acetaminophen (PERCOCET) 5-325 MG per tablet Take 2 tablets by mouth every 4 (four) hours as needed. 04/20/13   Malvin Johns, MD  pantoprazole (PROTONIX) 40 MG tablet Take 1 tablet (40 mg total) by mouth 2 (two) times daily. 05/22/13   [name], MD  simvastatin (ZOCOR) 5 MG tablet Take 1 tablet (5 mg total) by mouth at bedtime. 02/06/13   [name],  MD   BP 138/86  Pulse 79  Temp(Src) 98.8 F (37.1 C) (Oral)  Resp 18  Ht 5\' 8"  (1.727 m)  Wt 255 lb (115.667 kg)  BMI 38.78 kg/m2  SpO2 98% Physical Exam Nursing notes and Vital Signs reviewed. Appearance:  Patient appears stated age, and in no acute distress.  Patient is obese (BMI 38.8) Eyes:  Pupils are equal, round, and reactive to light and accomodation.  Extraocular movement is intact.  Conjunctivae are not inflamed  Mouth/Pharynx:  Normal; moist mucous membranes  Neck:  Supple.  No adenopathy Lungs:  Clear to auscultation.  Breath sounds are equal.  Heart:  Regular rate and rhythm without murmurs, rubs, or gallops.  Abdomen:  Bilateral suprapubic tenderness without masses or hepatosplenomegaly.  Bowel sounds are present.   Mild right CVA tenderness is present.  Extremities:  No edema.  No calf tenderness Skin:  No rash present.   Genitourinary:  Penis normal without lesions or urethral discharge.  Scrotum is normal.  Testes are descended bilaterally without nodules or tenderness.  No hernias are palpated.    ED Course  Procedures  none    Labs Reviewed  URINE CULTURE  POCT URINALYSIS DIP (MANUAL ENTRY):  BLO large; PRO 30mg /dL; LEU trace  POCT CBC W AUTO DIFF ([facility]):  WBC 5.4; LY 37.4; MO 9.0; GR 53.6; Hgb 11.8; Platelets 231    Reviewed previous CT Abdomen/pelvis w/o contrast done 04/20/13:  Bilateral nephrolithiasis.  In the lower pole of the right kidney is a clustered calculi 0.8 by 0.4cm.  In the left kidney lower pole is a 1-84mm calculus   MDM   1. Dysuria; ?UTI.  History of nephrolithiasis    Urine culture pending.  Begin Cipro. Increase fluid intake.  Strain urine.  Stop Zocor (simvastatin) while taking Cipro.   If symptoms become significantly worse during the night or over the weekend, proceed to the local emergency room.  Followup with Family Doctor if not improved in one week.     Kandra Nicolas, MD 09/27/13 2139

## 2013-09-25 NOTE — ED Notes (Signed)
Pt c/o dark colored urine, bladder pressure/pain and LBP x 2 days. Denies fever. He reports a hx of kidney stones. No OTC meds.

## 2013-09-25 NOTE — Discharge Instructions (Signed)
Increase fluid intake.  Strain urine.  Stop Zocor (simvastatin) while taking Cipro.   If symptoms become significantly worse during the night or over the weekend, proceed to the local emergency room.

## 2013-09-26 ENCOUNTER — Ambulatory Visit (INDEPENDENT_AMBULATORY_CARE_PROVIDER_SITE_OTHER): Admitting: Family

## 2013-09-26 ENCOUNTER — Encounter: Payer: Self-pay | Admitting: Family

## 2013-09-26 VITALS — BP 138/90 | HR 87 | Temp 98.5°F | Resp 18 | Ht 68.5 in | Wt 254.1 lb

## 2013-09-26 DIAGNOSIS — N200 Calculus of kidney: Secondary | ICD-10-CM

## 2013-09-26 DIAGNOSIS — N390 Urinary tract infection, site not specified: Secondary | ICD-10-CM

## 2013-09-26 DIAGNOSIS — B354 Tinea corporis: Secondary | ICD-10-CM

## 2013-09-26 MED ORDER — ECONAZOLE NITRATE 1 % EX CREA: TOPICAL_CREAM | Freq: Every day | CUTANEOUS | Status: DC

## 2013-09-26 MED ORDER — HYDROCODONE-ACETAMINOPHEN 5-325 MG PO TABS: 1.0000 | ORAL_TABLET | Freq: Four times a day (QID) | ORAL | Status: DC | PRN

## 2013-09-26 NOTE — Patient Instructions (Signed)
Continue cipro. You may use vicodin as needed for severe pain. Do not drive after taking. You will be contacted about your referral to Urology.  Please let us know if you have not heard back by Thursday about this referral.  Call if you develop fever. Go to the ER if you have severe/worsening pain or difficulty urinating.

## 2013-09-26 NOTE — Progress Notes (Signed)
Subjective:    Patient ID: Brent [name], male    DOB: [birth year], 58 y.o.   MRN: [id number]  HPI  [name] is a 58 yr old male who presents today for ED follow up.ED records are reviewed. He presented yesterday to the ED with chief complaint of hematuria and flank pain. CT performed noted bilateral nephrolithiasis.  Stones were non-obstructive.  Urinalysis noted large blood and trace leukocytes. Pt was instructed to begin cipro, strain urine and follow up with primary care.  Pain is most severe on the right side. Reports associated nausea. 8/10.  He reports that he has had stones back in January.  He has never met with urology.    Skin rash- reports that he develops rash on chest every summer.    Review of Systems See HPI  Past Medical History  Diagnosis Date  . DM (diabetes mellitus), type 2, uncontrolled   . Acid reflux disease   . Sleep apnea     a. CPAP  . Hx of colonic polyps   . Diverticulosis of colon   . Hypertension 07/14/2010  . Morbid obesity 03/27/2010    a. 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  . 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 HYPERSOMNIA, ASSOCIATED WITH SLEEP APNEA 07/26/2008  . DIVERTICULOSIS, COLON 10/29/2008  . Diarrhea 06/13/2010  . COLONIC POLYPS, HX OF 10/29/2008  . Benign neoplasm of colon 07/05/2007  . ACID REFLUX DISEASE 07/05/2007  . Knee pain, left 10/10/2010  . Tear of meniscus of left knee 2012  . HTN (hypertension) 07/14/2010  . Breast pain, left 11/17/2011  . Preventative health care 11/17/2011  . Chest pain     a. Reportedly negative dobut echo performed prior to gastric bypass in 03/2011;  b. 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 12/2011 Cath: LM nl, LAD 50p, D1 76m LCX min irregs, OM3 30, RCA 25p, 762mFFR 0.99->0.89), PDA 30, EF 65%, Med Rx.  . Other and unspecified hyperlipidemia 11/16/2012  . Gout 03/04/2013  . GI bleed   . Atherosclerosis   . Anemia   . Hearing loss 05/24/2013    Previous audiology evaluation completely.    History    Social History  . Marital Status: Married    Spouse Name: N/A    Number of Children: 2  . Years of Education: N/A   Occupational History  . TEACHER    Social History Main Topics  . Smoking status: Former Smoker -- 1.50 packs/day for 20 years    Types: Cigarettes    Quit date: 04/21/1991  . Smokeless tobacco: Never Used  . Alcohol Use: 0.0 oz/week    0 drink(s) per week     Comment:  occasionaly- social  . Drug Use: No  . Sexual Activity: Yes   Other Topics Concern  . Not on file   Social History Narrative   [place] in [place] Does not routinely exercise.    Past Surgical History  Procedure Laterality Date  . Ankle surgery  1994  . Tonsillectomy  age 49 8. Colonoscopy polyps    . Knee arthroscopy  11/06/10    Left, torn meniscus (repaired)  . Colonoscopy    . Gastric bypass    . Hernia repair    . Upper gi endoscopy  03/27/13  . Esophagogastroduodenoscopy N/A 03/27/2013    Procedure: ESOPHAGOGASTRODUODENOSCOPY (EGD);  Surgeon: JoIrene ShipperMD;  [facility];  Service: Endoscopy;  Laterality: N/A;    Family History  Problem Relation Age of Onset  . Diabetes Mother   . Hypertension Mother   . Stroke Mother   . Hyperlipidemia Mother   . Hypertension Father   . Colon polyps Father   . Heart attack Father 53  . Stroke Father   . Heart attack Brother   . Diabetes Brother   . Heart disease Brother   . Heart attack Brother     Multiple  . Diabetes Brother   . Other Brother     heart problems  . Heart disease Brother   . Diabetes Sister   . Obesity Brother   . Diabetes Brother   . Hypertension Maternal Grandmother   . ADD / ADHD Daughter   . Stomach cancer Neg Hx     Allergies  Allergen Reactions  . Bee Venom Anaphylaxis  . Lipitor [Atorvastatin]     Myalgias, memory changes  . Morphine     hyperactive    Current Outpatient Prescriptions on File Prior to Visit  Medication Sig Dispense Refill  . B Complex Vitamins (VITAMIN-B  COMPLEX) TABS Place 1 tablet under the tongue daily.      [name] CALCIUM CITRATE PO Take 1,500 mg by mouth daily.      . Cholecalciferol (VITAMIN D-3) 5000 UNITS TABS Take 1 tablet by mouth daily.      . ciprofloxacin (CIPRO) 500 MG tablet Take one tab po q12 hours  14 tablet  0  . Cyanocobalamin (VITAMIN B-12) 1000 MCG SUBL Place 1 each under the tongue daily.      . fenofibrate 160 MG tablet Take 1 tablet (160 mg total) by mouth daily.  90 tablet  3  . Ferrous Sulfate (RA IRON) 27 MG TABS Take 1 tablet by mouth. 3 times per week.      Javier Docker Oil 1000 MG CAPS Take 2 tablets by mouth daily.      . metFORMIN (GLUCOPHAGE) 500 MG tablet Take 500 mg by mouth 2 (two) times daily with a meal.      . metoprolol tartrate (LOPRESSOR) 25 MG tablet TAKE ONE-HALF (1/2) TABLET (12.5 MG TOTAL) TWICE A DAY  90 tablet  0  . Multiple Vitamin (MULTIVITAMIN) tablet Take 2 tablets by mouth daily. BARIATRIC VIT      . pantoprazole (PROTONIX) 40 MG tablet Take 1 tablet (40 mg total) by mouth 2 (two) times daily.  180 tablet  3  . simvastatin (ZOCOR) 5 MG tablet Take 1 tablet (5 mg total) by mouth at bedtime.  90 tablet  2  . cetirizine (ZYRTEC) 10 MG tablet Take 10 mg by mouth daily.      [name] oxyCODONE-acetaminophen (PERCOCET) 5-325 MG per tablet Take 2 tablets by mouth every 4 (four) hours as needed.  20 tablet  0  . [DISCONTINUED] ramipril (ALTACE) 10 MG tablet Take 1 tablet (10 mg total) by mouth daily. 2 tabs po daily  180 tablet  1   No current facility-administered medications on file prior to visit.    BP 138/90  Pulse 87  Temp(Src) 98.5 F (36.9 C) (Oral)  Resp 18  Ht 5' 8.5" (1.74 m)  Wt 254 lb 1.3 oz (115.25 kg)  BMI 38.07 kg/m2  SpO2 99%       Objective:   Physical Exam  Constitutional: He is oriented to person, place, and time. He appears well-developed and well-nourished. No distress.  HENT:  Head: Normocephalic and atraumatic.  Cardiovascular: Normal rate and regular rhythm.   No murmur  heard. Pulmonary/Chest: Effort normal and breath sounds normal. No respiratory distress. He has no wheezes. He has no rales. He exhibits no tenderness.  Abdominal: Soft. There is tenderness in the right lower quadrant.  Genitourinary:  + R CVA tenderness  Musculoskeletal: He exhibits no edema.  Neurological: He is alert and oriented to person, place, and time.  Skin:  Annular hyperpigmented rash noted on neck and shoulders  Psychiatric: He has a normal mood and affect. His behavior is normal. Judgment and thought content normal.          Assessment & Plan:

## 2013-09-26 NOTE — Progress Notes (Signed)
Pre visit review using our clinic review tool, if applicable. No additional management support is needed unless otherwise documented below in the visit note. 

## 2013-09-27 DIAGNOSIS — N200 Calculus of kidney: Secondary | ICD-10-CM | POA: Insufficient documentation

## 2013-09-27 DIAGNOSIS — B354 Tinea corporis: Secondary | ICD-10-CM | POA: Insufficient documentation

## 2013-09-27 DIAGNOSIS — N390 Urinary tract infection, site not specified: Secondary | ICD-10-CM | POA: Insufficient documentation

## 2013-09-27 LAB — URINE CULTURE: Colony Count: 40000

## 2013-09-27 NOTE — Assessment & Plan Note (Signed)
Will refer to Urology for further treatment.  Rx provided for vicodin prn. Pt advised not to drive after taking.

## 2013-09-27 NOTE — Assessment & Plan Note (Addendum)
Pt requests rx for Econazole. Reports that this has worked well for him in the past. Rx sent to pharmacy.

## 2013-09-27 NOTE — Assessment & Plan Note (Signed)
Urine culture from ED grew Cipro sensitive klebsiella.  Continue 7 day course of cipro.

## 2013-09-28 ENCOUNTER — Telehealth: Payer: Self-pay | Admitting: Family Medicine

## 2013-09-28 ENCOUNTER — Other Ambulatory Visit: Payer: Self-pay | Admitting: Family Medicine

## 2013-09-28 NOTE — Telephone Encounter (Signed)
Informed patient of this.  °

## 2013-09-28 NOTE — Telephone Encounter (Signed)
Patient states that he tried looking in [place] for his test results but states that when he tries to view results, there are "no values" in result screen???  Patient would like a callback regarding results

## 2013-09-28 NOTE — Telephone Encounter (Signed)
Inform patient that [facility] did these tests and he will need to contact them for the results. He left a message on my machine stating he missed my call? I didn't call him.

## 2013-09-29 ENCOUNTER — Telehealth: Payer: Self-pay | Admitting: Emergency Medicine

## 2013-10-17 ENCOUNTER — Telehealth: Payer: Self-pay | Admitting: Pulmonary Disease

## 2013-10-17 DIAGNOSIS — G4733 Obstructive sleep apnea (adult) (pediatric): Secondary | ICD-10-CM

## 2013-10-17 NOTE — Telephone Encounter (Signed)
cpap 11 cm

## 2013-10-17 NOTE — Telephone Encounter (Signed)
Pt requesting new CPAP machine. Current machine is 84+ years old Per pt, [facility] is having a hard time getting a data read off of it.  Needs order for new machine and supplies placed.  Please advise Dr Elsworth Soho thanks.

## 2013-10-17 NOTE — Telephone Encounter (Signed)
Pt aware of recs. Order placed. Nothing further needed 

## 2013-10-18 ENCOUNTER — Other Ambulatory Visit: Payer: Self-pay | Admitting: Family Medicine

## 2013-11-18 DIAGNOSIS — N200 Calculus of kidney: Secondary | ICD-10-CM

## 2013-11-18 HISTORY — DX: Calculus of kidney: N20.0

## 2013-11-21 ENCOUNTER — Ambulatory Visit (INDEPENDENT_AMBULATORY_CARE_PROVIDER_SITE_OTHER): Admitting: Family Medicine

## 2013-11-21 ENCOUNTER — Encounter: Payer: Self-pay | Admitting: Family Medicine

## 2013-11-21 VITALS — BP 98/70 | HR 87 | Temp 98.2°F | Ht 68.5 in | Wt 252.0 lb

## 2013-11-21 DIAGNOSIS — N200 Calculus of kidney: Secondary | ICD-10-CM

## 2013-11-21 DIAGNOSIS — E119 Type 2 diabetes mellitus without complications: Secondary | ICD-10-CM

## 2013-11-21 DIAGNOSIS — I10 Essential (primary) hypertension: Secondary | ICD-10-CM

## 2013-11-21 DIAGNOSIS — G4733 Obstructive sleep apnea (adult) (pediatric): Secondary | ICD-10-CM

## 2013-11-21 DIAGNOSIS — E785 Hyperlipidemia, unspecified: Secondary | ICD-10-CM

## 2013-11-21 DIAGNOSIS — R51 Headache: Secondary | ICD-10-CM

## 2013-11-21 DIAGNOSIS — R509 Fever, unspecified: Secondary | ICD-10-CM

## 2013-11-21 DIAGNOSIS — K219 Gastro-esophageal reflux disease without esophagitis: Secondary | ICD-10-CM

## 2013-11-21 LAB — HEMOGLOBIN A1C
Hgb A1c MFr Bld: 7.3 % — ABNORMAL HIGH (ref <5.7)
Mean Plasma Glucose: 163 mg/dL — ABNORMAL HIGH (ref <117)

## 2013-11-21 LAB — RENAL FUNCTION PANEL
Albumin: 4.4 g/dL (ref 3.5–5.2)
BUN: 20 mg/dL (ref 6–23)
CO2: 26 mEq/L (ref 19–32)
Calcium: 9 mg/dL (ref 8.4–10.5)
Chloride: 102 mEq/L (ref 96–112)
Creat: 0.97 mg/dL (ref 0.50–1.35)
Glucose, Bld: 185 mg/dL — ABNORMAL HIGH (ref 70–99)
Phosphorus: 3 mg/dL (ref 2.3–4.6)
Potassium: 4.3 mEq/L (ref 3.5–5.3)
Sodium: 137 mEq/L (ref 135–145)

## 2013-11-21 LAB — LIPID PANEL
Cholesterol: 122 mg/dL (ref 0–200)
HDL: 33 mg/dL — ABNORMAL LOW (ref >39)
LDL Cholesterol: 48 mg/dL (ref 0–99)
Total CHOL/HDL Ratio: 3.7 Ratio
Triglycerides: 207 mg/dL — ABNORMAL HIGH (ref <150)
VLDL: 41 mg/dL — ABNORMAL HIGH (ref 0–40)

## 2013-11-21 LAB — HEPATIC FUNCTION PANEL
ALT: 21 U/L (ref 0–53)
AST: 27 U/L (ref 0–37)
Albumin: 4.4 g/dL (ref 3.5–5.2)
Alkaline Phosphatase: 37 U/L — ABNORMAL LOW (ref 39–117)
Bilirubin, Direct: 0.2 mg/dL (ref 0.0–0.3)
Indirect Bilirubin: 0.5 mg/dL (ref 0.2–1.2)
Total Bilirubin: 0.7 mg/dL (ref 0.2–1.2)
Total Protein: 6.3 g/dL (ref 6.0–8.3)

## 2013-11-21 LAB — CBC
HCT: 35.4 % — ABNORMAL LOW (ref 39.0–52.0)
Hemoglobin: 11.7 g/dL — ABNORMAL LOW (ref 13.0–17.0)
MCH: 26 pg (ref 26.0–34.0)
MCHC: 33.1 g/dL (ref 30.0–36.0)
MCV: 78.7 fL (ref 78.0–100.0)
Platelets: 237 10*3/uL (ref 150–400)
RBC: 4.5 MIL/uL (ref 4.22–5.81)
RDW: 15.1 % (ref 11.5–15.5)
WBC: 4.3 10*3/uL (ref 4.0–10.5)

## 2013-11-21 LAB — TSH: TSH: 2.718 u[IU]/mL (ref 0.350–4.500)

## 2013-11-21 NOTE — Progress Notes (Signed)
Patient ID: Brent [name], male   DOB: 04-22-1955, 58 y.o.   MRN: [id number] Brent [name] [id number] 05-20-1955 11/21/2013      Progress Note-Follow Up  Subjective  Chief Complaint  Chief Complaint  Patient presents with  . Follow-up    6 month    HPI  Patient is a 58 year old male in today for routine medical care. He recently had a bad flare with kidney stones. He had flank pain, abodiminal pain, dysuria, hematuria and back pain. Has been seen by urology and is doing better. Is using CPAP regularly and still struggling with sleep. Is also using Melatonin but has trouble maintaining sleep. He has cut back on caffeine as well. Blood sugars are generally between 100 and 115 but he did have a 189. Denies CP/palp/SOB/HA/congestion/fevers/GI  c/o. Taking meds as prescribed  Past Medical History  Diagnosis Date  . DM (diabetes mellitus), type 2, uncontrolled   . Acid reflux disease   . Sleep apnea     a. CPAP  . Hx of colonic polyps   . Diverticulosis of colon   . Hypertension 07/14/2010  . Morbid obesity 03/27/2010    a. 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  . 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 HYPERSOMNIA, ASSOCIATED WITH SLEEP APNEA 07/26/2008  . DIVERTICULOSIS, COLON 10/29/2008  . Diarrhea 06/13/2010  . COLONIC POLYPS, HX OF 10/29/2008  . Benign neoplasm of colon 07/05/2007  . ACID REFLUX DISEASE 07/05/2007  . Knee pain, left 10/10/2010  . Tear of meniscus of left knee 2012  . HTN (hypertension) 07/14/2010  . Breast pain, left 11/17/2011  . Preventative health care 11/17/2011  . Chest pain     a. Reportedly negative dobut echo performed prior to gastric bypass in 03/2011;  b. 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 12/2011 Cath: LM nl, LAD 50p, D1 60m, LCX min irregs, OM3 30, RCA 25p, 13m (FFR 0.99->0.89), PDA 30, EF 65%, Med Rx.  . Other and unspecified hyperlipidemia 11/16/2012  . Gout 03/04/2013  . GI bleed   . Atherosclerosis   . Anemia   . Hearing loss 05/24/2013    Previous audiology  evaluation completely.    Past Surgical History  Procedure Laterality Date  . Ankle surgery  1994  . Tonsillectomy  age 65  . Colonoscopy polyps    . Knee arthroscopy  11/06/10    Left, torn meniscus (repaired)  . Colonoscopy    . Gastric bypass    . Hernia repair    . Upper gi endoscopy  03/27/13  . Esophagogastroduodenoscopy N/A 03/27/2013    Procedure: ESOPHAGOGASTRODUODENOSCOPY (EGD);  Surgeon: Irene Shipper, MD;  Location: [facility];  Service: Endoscopy;  Laterality: N/A;    Family History  Problem Relation Age of Onset  . Diabetes Mother   . Hypertension Mother   . Stroke Mother   . Hyperlipidemia Mother   . Hypertension Father   . Colon polyps Father   . Heart attack Father 17  . Stroke Father   . Heart attack Brother   . Diabetes Brother   . Heart disease Brother   . Heart attack Brother     Multiple  . Diabetes Brother   . Other Brother     heart problems  . Heart disease Brother   . Diabetes Sister   . Obesity Brother   . Diabetes Brother   . Hypertension Maternal Grandmother   . ADD / ADHD Daughter   . Stomach cancer Neg  Hx     History   Social History  . Marital Status: Married    Spouse Name: N/A    Number of Children: 2  . Years of Education: N/A   Occupational History  . TEACHER    Social History Main Topics  . Smoking status: Former Smoker -- 1.50 packs/day for 20 years    Types: Cigarettes    Quit date: 04/21/1991  . Smokeless tobacco: Never Used  . Alcohol Use: 0.0 oz/week    0 drink(s) per week     Comment:  occasionaly- social  . Drug Use: No  . Sexual Activity: Yes   Other Topics Concern  . Not on file   Social History Narrative   Lives with wife in [place].  Does not routinely exercise.    Current Outpatient Prescriptions on File Prior to Visit  Medication Sig Dispense Refill  . B Complex Vitamins (VITAMIN-B COMPLEX) TABS Place 1 tablet under the tongue daily.      [name] CALCIUM CITRATE PO Take 1,500 mg by mouth daily.       . cetirizine (ZYRTEC) 10 MG tablet Take 10 mg by mouth daily.      . Cholecalciferol (VITAMIN D-3) 5000 UNITS TABS Take 1 tablet by mouth daily.      . Cyanocobalamin (VITAMIN B-12) 1000 MCG SUBL Place 1 each under the tongue daily.      [name] econazole nitrate 1 % cream Apply topically daily.  30 g  1  . Ferrous Sulfate (RA IRON) 27 MG TABS Take 1 tablet by mouth. 3 times per week.      Javier Docker Oil 1000 MG CAPS Take 2 tablets by mouth daily.      . metFORMIN (GLUCOPHAGE) 500 MG tablet TAKE ONE-HALF TABLET (250 MG TOTAL) TWO TIMES DAILY WITH A MEAL  90 tablet  1  . metoprolol tartrate (LOPRESSOR) 25 MG tablet TAKE ONE-HALF TABLET (12.5 MG) TWICE A DAY  90 tablet  1  . Multiple Vitamin (MULTIVITAMIN) tablet Take 2 tablets by mouth daily. BARIATRIC VIT      . pantoprazole (PROTONIX) 40 MG tablet Take 1 tablet (40 mg total) by mouth 2 (two) times daily.  180 tablet  3  . simvastatin (ZOCOR) 5 MG tablet TAKE 1 TABLET AT BEDTIME  90 tablet  1  . [DISCONTINUED] ramipril (ALTACE) 10 MG tablet Take 1 tablet (10 mg total) by mouth daily. 2 tabs po daily  180 tablet  1   No current facility-administered medications on file prior to visit.    Allergies  Allergen Reactions  . Bee Venom Anaphylaxis  . Lipitor [Atorvastatin]     Myalgias, memory changes  . Morphine     hyperactive    Review of Systems  Review of Systems  Constitutional: Negative for fever and malaise/fatigue.  HENT: Negative for congestion.   Eyes: Negative for discharge.  Respiratory: Negative for shortness of breath.   Cardiovascular: Negative for chest pain, palpitations and leg swelling.  Gastrointestinal: Negative for nausea, abdominal pain and diarrhea.  Genitourinary: Negative for dysuria, hematuria and flank pain.  Musculoskeletal: Negative for falls.  Skin: Negative for rash.  Neurological: Negative for loss of consciousness and headaches.  Endo/Heme/Allergies: Negative for polydipsia.  Psychiatric/Behavioral:  Negative for depression and suicidal ideas. The patient has insomnia. The patient is not nervous/anxious.     Objective  BP 98/70  Pulse 87  Temp(Src) 98.2 F (36.8 C) (Oral)  Ht 5' 8.5" (1.74 m)  Wt 252 lb  0.6 oz (114.325 kg)  BMI 37.76 kg/m2  SpO2 95%  Physical Exam  Physical Exam  Constitutional: He is oriented to person, place, and time and well-developed, well-nourished, and in no distress. No distress.  HENT:  Head: Normocephalic and atraumatic.  Eyes: Conjunctivae are normal.  Neck: Neck supple. No thyromegaly present.  Cardiovascular: Normal rate, regular rhythm and normal heart sounds.   No murmur heard. Pulmonary/Chest: Effort normal and breath sounds normal. No respiratory distress.  Abdominal: He exhibits no distension and no mass. There is no tenderness.  Musculoskeletal: He exhibits no edema.  Neurological: He is alert and oriented to person, place, and time.  Skin: Skin is warm.  Psychiatric: Memory, affect and judgment normal.    Lab Results  Component Value Date   TSH 1.014 05/22/2013   Lab Results  Component Value Date   WBC 7.0 05/22/2013   HGB 11.6* 05/22/2013   HCT 35.6* 05/22/2013   MCV 81.1 05/22/2013   PLT 248 05/22/2013   Lab Results  Component Value Date   CREATININE 0.93 05/22/2013   BUN 18 05/22/2013   NA 142 05/22/2013   K 4.4 05/22/2013   CL 107 05/22/2013   CO2 26 05/22/2013   Lab Results  Component Value Date   ALT 12 03/28/2013   AST 15 03/28/2013   ALKPHOS 35* 03/28/2013   BILITOT 0.3 03/28/2013   Lab Results  Component Value Date   CHOL 132 05/22/2013   Lab Results  Component Value Date   HDL 37* 05/22/2013   Lab Results  Component Value Date   LDLCALC 55 05/22/2013   Lab Results  Component Value Date   TRIG 200* 05/22/2013   Lab Results  Component Value Date   CHOLHDL 3.6 05/22/2013     Assessment & Plan  HTN (hypertension) Well controlled, no changes to meds. Encouraged heart healthy diet such as the DASH diet and exercise as  tolerated.   Diabetes type 2, controlled A1Cup to 7.3, increase Metformin and minimize simple carbs and increase exercise  Acid reflux Avoid offending foods, start probiotics. Do not eat large meals in late evening and consider raising head of bed.   OSA (obstructive sleep apnea) Doing well on adjusted  CPAP levels  Headache(784.0) Patient c/o some fevers and a history of tick bites, will confirm Lyme titer is negative  Nephrolithiasis Had  A recent UTI, is feeling better now, has follow up with urology in October. Maintain adequate hydration and probiotics  Other and unspecified hyperlipidemia Tolerating statin, encouraged heart healthy diet, avoid trans fats, minimize simple carbs and saturated fats. Increase exercise as tolerated

## 2013-11-21 NOTE — Progress Notes (Signed)
Pre visit review using our clinic review tool, if applicable. No additional management support is needed unless otherwise documented below in the visit note. 

## 2013-11-21 NOTE — Patient Instructions (Addendum)
Encouraged good sleep hygiene such as dark, quiet room. No blue/green glowing lights such as computer screens in bedroom. No alcohol or stimulants in evening. Cut down on caffeine as able. Regular exercise is helpful but not just prior to bed time. Try Melatonin 5-10 mg at bedtime  Shoes off next visit   Insomnia Insomnia is frequent trouble falling and/or staying asleep. Insomnia can be a long term problem or a short term problem. Both are common. Insomnia can be a short term problem when the wakefulness is related to a certain stress or worry. Long term insomnia is often related to ongoing stress during waking hours and/or poor sleeping habits. Overtime, sleep deprivation itself can make the problem worse. Every little thing feels more severe because you are overtired and your ability to cope is decreased. CAUSES   Stress, anxiety, and depression.  Poor sleeping habits.  Distractions such as TV in the bedroom.  Naps close to bedtime.  Engaging in emotionally charged conversations before bed.  Technical reading before sleep.  Alcohol and other sedatives. They may make the problem worse. They can hurt normal sleep patterns and normal dream activity.  Stimulants such as caffeine for several hours prior to bedtime.  Pain syndromes and shortness of breath can cause insomnia.  Exercise late at night.  Changing time zones may cause sleeping problems (jet lag). It is sometimes helpful to have someone observe your sleeping patterns. They should look for periods of not breathing during the night (sleep apnea). They should also look to see how long those periods last. If you live alone or observers are uncertain, you can also be observed at a sleep clinic where your sleep patterns will be professionally monitored. Sleep apnea requires a checkup and treatment. Give your caregivers your medical history. Give your caregivers observations your family has made about your sleep.  SYMPTOMS   Not  feeling rested in the morning.  Anxiety and restlessness at bedtime.  Difficulty falling and staying asleep. TREATMENT   Your caregiver may prescribe treatment for an underlying medical disorders. Your caregiver can give advice or help if you are using alcohol or other drugs for self-medication. Treatment of underlying problems will usually eliminate insomnia problems.  Medications can be prescribed for short time use. They are generally not recommended for lengthy use.  Over-the-counter sleep medicines are not recommended for lengthy use. They can be habit forming.  You can promote easier sleeping by making lifestyle changes such as:  Using relaxation techniques that help with breathing and reduce muscle tension.  Exercising earlier in the day.  Changing your diet and the time of your last meal. No night time snacks.  Establish a regular time to go to bed.  Counseling can help with stressful problems and worry.  Soothing music and white noise may be helpful if there are background noises you cannot remove.  Stop tedious detailed work at least one hour before bedtime. HOME CARE INSTRUCTIONS   Keep a diary. Inform your caregiver about your progress. This includes any medication side effects. See your caregiver regularly. Take note of:  Times when you are asleep.  Times when you are awake during the night.  The quality of your sleep.  How you feel the next day. This information will help your caregiver care for you.  Get out of bed if you are still awake after 15 minutes. Read or do some quiet activity. Keep the lights down. Wait until you feel sleepy and go back to bed.  Keep regular sleeping and waking hours. Avoid naps.  Exercise regularly.  Avoid distractions at bedtime. Distractions include watching television or engaging in any intense or detailed activity like attempting to balance the household checkbook.  Develop a bedtime ritual. Keep a familiar routine of  bathing, brushing your teeth, climbing into bed at the same time each night, listening to soothing music. Routines increase the success of falling to sleep faster.  Use relaxation techniques. This can be using breathing and muscle tension release routines. It can also include visualizing peaceful scenes. You can also help control troubling or intruding thoughts by keeping your mind occupied with boring or repetitive thoughts like the old concept of counting sheep. You can make it more creative like imagining planting one beautiful flower after another in your backyard garden.  During your day, work to eliminate stress. When this is not possible use some of the previous suggestions to help reduce the anxiety that accompanies stressful situations. MAKE SURE YOU:   Understand these instructions.  Will watch your condition.  Will get help right away if you are not doing well or get worse. Document Released: 04/03/2000 Document Revised: 06/29/2011 Document Reviewed: 05/04/2007 [facility] Patient Information 2015 [place], [place]. This information is not intended to replace advice given to you by your health care provider. Make sure you discuss any questions you have with your health care provider.

## 2013-11-22 ENCOUNTER — Other Ambulatory Visit: Payer: Self-pay | Admitting: Family Medicine

## 2013-11-22 MED ORDER — METFORMIN HCL 500 MG PO TABS: 500.0000 mg | ORAL_TABLET | Freq: Three times a day (TID) | ORAL | Status: DC

## 2013-11-22 MED ORDER — SIMVASTATIN 10 MG PO TABS: ORAL_TABLET | ORAL | Status: DC

## 2013-11-26 DIAGNOSIS — R51 Headache: Secondary | ICD-10-CM | POA: Insufficient documentation

## 2013-11-26 DIAGNOSIS — R509 Fever, unspecified: Secondary | ICD-10-CM | POA: Insufficient documentation

## 2013-11-26 DIAGNOSIS — R519 Headache, unspecified: Secondary | ICD-10-CM | POA: Insufficient documentation

## 2013-11-26 NOTE — Assessment & Plan Note (Signed)
Patient c/o some fevers and a history of tick bites, will confirm Lyme titer is negative

## 2013-11-26 NOTE — Assessment & Plan Note (Signed)
Tolerating statin, encouraged heart healthy diet, avoid trans fats, minimize simple carbs and saturated fats. Increase exercise as tolerated 

## 2013-11-26 NOTE — Assessment & Plan Note (Signed)
Doing well on adjusted  CPAP levels

## 2013-11-26 NOTE — Assessment & Plan Note (Signed)
A1Cup to 7.3, increase Metformin and minimize simple carbs and increase exercise

## 2013-11-26 NOTE — Assessment & Plan Note (Signed)
Had  A recent UTI, is feeling better now, has follow up with urology in October. Maintain adequate hydration and probiotics

## 2013-11-26 NOTE — Assessment & Plan Note (Signed)
Avoid offending foods, start probiotics. Do not eat large meals in late evening and consider raising head of bed.  

## 2013-11-26 NOTE — Assessment & Plan Note (Signed)
Well controlled, no changes to meds. Encouraged heart healthy diet such as the DASH diet and exercise as tolerated.  °

## 2013-11-30 ENCOUNTER — Telehealth: Payer: Self-pay | Admitting: *Deleted

## 2013-11-30 NOTE — Telephone Encounter (Signed)
11 cm  Very effective Need to increase usage  lmomtcb x1

## 2013-12-01 NOTE — Telephone Encounter (Signed)
I spoke with patient about results and he verbalized understanding and had no questions 

## 2013-12-01 NOTE — Telephone Encounter (Signed)
lmomtcb x 2  

## 2013-12-02 ENCOUNTER — Encounter (HOSPITAL_COMMUNITY): Payer: Self-pay | Admitting: Emergency Medicine

## 2013-12-02 ENCOUNTER — Emergency Department (HOSPITAL_COMMUNITY)
Admission: EM | Admit: 2013-12-02 | Discharge: 2013-12-03 | Disposition: A | Discharge status: Home / Self Care | Attending: Emergency Medicine | Admitting: Emergency Medicine

## 2013-12-02 DIAGNOSIS — D649 Anemia, unspecified: Secondary | ICD-10-CM | POA: Insufficient documentation

## 2013-12-02 DIAGNOSIS — Z9981 Dependence on supplemental oxygen: Secondary | ICD-10-CM | POA: Insufficient documentation

## 2013-12-02 DIAGNOSIS — E119 Type 2 diabetes mellitus without complications: Secondary | ICD-10-CM | POA: Insufficient documentation

## 2013-12-02 DIAGNOSIS — Z8739 Personal history of other diseases of the musculoskeletal system and connective tissue: Secondary | ICD-10-CM | POA: Insufficient documentation

## 2013-12-02 DIAGNOSIS — Z860100 Personal history of colon polyps, unspecified: Secondary | ICD-10-CM | POA: Insufficient documentation

## 2013-12-02 DIAGNOSIS — Z79899 Other long term (current) drug therapy: Secondary | ICD-10-CM | POA: Insufficient documentation

## 2013-12-02 DIAGNOSIS — G4730 Sleep apnea, unspecified: Secondary | ICD-10-CM

## 2013-12-02 DIAGNOSIS — Z87891 Personal history of nicotine dependence: Secondary | ICD-10-CM | POA: Insufficient documentation

## 2013-12-02 DIAGNOSIS — H9190 Unspecified hearing loss, unspecified ear: Secondary | ICD-10-CM | POA: Insufficient documentation

## 2013-12-02 DIAGNOSIS — I10 Essential (primary) hypertension: Secondary | ICD-10-CM | POA: Insufficient documentation

## 2013-12-02 DIAGNOSIS — R110 Nausea: Secondary | ICD-10-CM | POA: Insufficient documentation

## 2013-12-02 DIAGNOSIS — N529 Male erectile dysfunction, unspecified: Secondary | ICD-10-CM | POA: Insufficient documentation

## 2013-12-02 DIAGNOSIS — K219 Gastro-esophageal reflux disease without esophagitis: Secondary | ICD-10-CM | POA: Insufficient documentation

## 2013-12-02 DIAGNOSIS — R109 Unspecified abdominal pain: Secondary | ICD-10-CM

## 2013-12-02 DIAGNOSIS — G4710 Hypersomnia, unspecified: Secondary | ICD-10-CM | POA: Insufficient documentation

## 2013-12-02 NOTE — ED Notes (Signed)
Pt presents with c/o right lower abdominal pain that radiates to the right flank area. Pt says that he has had this pain on and off since January but the pain got worse today. Pt does have a hx of kidney stones and diverticulitis.

## 2013-12-03 LAB — CBC WITH DIFFERENTIAL/PLATELET
Basophils Absolute: 0 10*3/uL (ref 0.0–0.1)
Basophils Relative: 0 % (ref 0–1)
Eosinophils Absolute: 0.1 10*3/uL (ref 0.0–0.7)
Eosinophils Relative: 1 % (ref 0–5)
HCT: 34.9 % — ABNORMAL LOW (ref 39.0–52.0)
Hemoglobin: 11.3 g/dL — ABNORMAL LOW (ref 13.0–17.0)
Lymphocytes Relative: 11 % — ABNORMAL LOW (ref 12–46)
Lymphs Abs: 0.7 10*3/uL (ref 0.7–4.0)
MCH: 26.7 pg (ref 26.0–34.0)
MCHC: 32.4 g/dL (ref 30.0–36.0)
MCV: 82.3 fL (ref 78.0–100.0)
Monocytes Absolute: 0.6 10*3/uL (ref 0.1–1.0)
Monocytes Relative: 9 % (ref 3–12)
Neutro Abs: 5.4 10*3/uL (ref 1.7–7.7)
Neutrophils Relative %: 79 % — ABNORMAL HIGH (ref 43–77)
Platelets: 202 10*3/uL (ref 150–400)
RBC: 4.24 MIL/uL (ref 4.22–5.81)
RDW: 14.4 % (ref 11.5–15.5)
WBC: 6.8 10*3/uL (ref 4.0–10.5)

## 2013-12-03 LAB — COMPREHENSIVE METABOLIC PANEL
ALT: 20 U/L (ref 0–53)
AST: 26 U/L (ref 0–37)
Albumin: 4 g/dL (ref 3.5–5.2)
Alkaline Phosphatase: 46 U/L (ref 39–117)
Anion gap: 14 (ref 5–15)
BUN: 15 mg/dL (ref 6–23)
CO2: 24 mEq/L (ref 19–32)
Calcium: 9.2 mg/dL (ref 8.4–10.5)
Chloride: 101 mEq/L (ref 96–112)
Creatinine, Ser: 1.21 mg/dL (ref 0.50–1.35)
GFR calc Af Amer: 75 mL/min — ABNORMAL LOW (ref >90)
GFR calc non Af Amer: 64 mL/min — ABNORMAL LOW (ref >90)
Glucose, Bld: 170 mg/dL — ABNORMAL HIGH (ref 70–99)
Potassium: 4.3 mEq/L (ref 3.7–5.3)
Sodium: 139 mEq/L (ref 137–147)
Total Bilirubin: 0.4 mg/dL (ref 0.3–1.2)
Total Protein: 6.9 g/dL (ref 6.0–8.3)

## 2013-12-03 LAB — URINE MICROSCOPIC-ADD ON

## 2013-12-03 LAB — URINALYSIS, ROUTINE W REFLEX MICROSCOPIC
Bilirubin Urine: NEGATIVE
Glucose, UA: NEGATIVE mg/dL
Ketones, ur: NEGATIVE mg/dL
Leukocytes, UA: NEGATIVE
Nitrite: NEGATIVE
Protein, ur: NEGATIVE mg/dL
Specific Gravity, Urine: 1.018 (ref 1.005–1.030)
Urobilinogen, UA: 0.2 mg/dL (ref 0.0–1.0)
pH: 5 (ref 5.0–8.0)

## 2013-12-03 MED ORDER — HYDROCODONE-ACETAMINOPHEN 5-325 MG PO TABS
1.0000 | ORAL_TABLET | ORAL | Status: DC | PRN
Start: 2013-12-03 — End: 2013-12-12

## 2013-12-03 MED ORDER — ONDANSETRON HCL 4 MG/2ML IJ SOLN
4.0000 mg | Freq: Once | INTRAMUSCULAR | Status: AC
  Administered 2013-12-03: 4 mg via INTRAVENOUS
  Filled 2013-12-03: qty 2

## 2013-12-03 MED ORDER — KETOROLAC TROMETHAMINE 30 MG/ML IJ SOLN
30.0000 mg | Freq: Once | INTRAMUSCULAR | Status: AC
  Administered 2013-12-03: 30 mg via INTRAVENOUS
  Filled 2013-12-03: qty 1

## 2013-12-03 MED ORDER — SODIUM CHLORIDE 0.9 % IV BOLUS (SEPSIS)
1000.0000 mL | Freq: Once | INTRAVENOUS | Status: AC
  Administered 2013-12-03: 1000 mL via INTRAVENOUS

## 2013-12-03 MED ORDER — HYDROMORPHONE HCL PF 1 MG/ML IJ SOLN
1.0000 mg | Freq: Once | INTRAMUSCULAR | Status: AC
  Administered 2013-12-03: 1 mg via INTRAVENOUS
  Filled 2013-12-03: qty 1

## 2013-12-03 NOTE — Discharge Instructions (Signed)
Your testing today has not shown any signs concerning for an emergent cause of your symptoms. Please followup with your doctors for continued evaluation and treatment.    Flank Pain Flank pain refers to pain that is located on the side of the body between the upper abdomen and the back. The pain may occur over a short period of time (acute) or may be long-term or reoccurring (chronic). It may be mild or severe. Flank pain can be caused by many things. CAUSES  Some of the more common causes of flank pain include:  Muscle strains.   Muscle spasms.   A disease of your spine (vertebral disk disease).   A lung infection (pneumonia).   Fluid around your lungs (pulmonary edema).   A kidney infection.   Kidney stones.   A very painful skin rash caused by the chickenpox virus (shingles).   Gallbladder disease.  [place] care will depend on the cause of your pain. In general,  Rest as directed by your caregiver.  Drink enough fluids to keep your urine clear or pale yellow.  Only take over-the-counter or prescription medicines as directed by your caregiver. Some medicines may help relieve the pain.  Tell your caregiver about any changes in your pain.  Follow up with your caregiver as directed. SEEK IMMEDIATE MEDICAL CARE IF:   Your pain is not controlled with medicine.   You have new or worsening symptoms.  Your pain increases.   You have abdominal pain.   You have shortness of breath.   You have persistent nausea or vomiting.   You have swelling in your abdomen.   You feel faint or pass out.   You have blood in your urine.  You have a fever or persistent symptoms for more than 2-3 days.  You have a fever and your symptoms suddenly get worse. MAKE SURE YOU:   Understand these instructions.  Will watch your condition.  Will get help right away if you are not doing well or get worse. Document Released: 05/28/2005 Document  Revised: 12/30/2011 Document Reviewed: 11/19/2011 [facility] Patient Information 2015 [place], [place]. This information is not intended to replace advice given to you by your health care provider. Make sure you discuss any questions you have with your health care provider.

## 2013-12-03 NOTE — ED Provider Notes (Signed)
Medical screening examination/treatment/procedure(s) were performed by non-physician practitioner and as supervising physician I was immediately available for consultation/collaboration.   EKG Interpretation None       Kalman Drape, MD 12/03/13 [phone]

## 2013-12-03 NOTE — ED Provider Notes (Signed)
Medical screening examination/treatment/procedure(s) were performed by non-physician practitioner and as supervising physician I was immediately available for consultation/collaboration.   EKG Interpretation None       Kalman Drape, MD 12/03/13 [phone]

## 2013-12-03 NOTE — ED Provider Notes (Signed)
CSN: [id number]     Arrival date & time 12/02/13  2147 History   First MD Initiated Contact with Patient 12/03/13 0000     Chief Complaint  Patient presents with  . Abdominal Pain    (Consider location/radiation/quality/duration/timing/severity/associated sxs/prior Treatment) HPI Comments: Patient with history of renal stones presents with worsening of his intermittent pain in the right flank area. Patient states that he has had symptoms for the past 3 days, much worse today. Patient has waves of pain which are associated with nausea. No vomiting or fever. Patient took Vicodin without relief at home today. Pain is same as previous 'kidney stone' pain, but more severe and intolerable. Patient states that his urologist does not feel that this pain is due to kidney stones because they are in the kidney. The onset of this condition was acute. The course is waxing and waning. Aggravating factors: none. Alleviating factors: none.    Patient is a 58 y.o. male presenting with abdominal pain. The history is provided by the patient.  Abdominal Pain Associated symptoms: nausea   Associated symptoms: no chest pain, no cough, no diarrhea, no dysuria, no fever, no sore throat and no vomiting     Past Medical History  Diagnosis Date  . DM (diabetes mellitus), type 2, uncontrolled   . Acid reflux disease   . Sleep apnea     a. CPAP  . Hx of colonic polyps   . Diverticulosis of colon   . Hypertension 07/14/2010  . Morbid obesity 03/27/2010    a. 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  . 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 HYPERSOMNIA, ASSOCIATED WITH SLEEP APNEA 07/26/2008  . DIVERTICULOSIS, COLON 10/29/2008  . Diarrhea 06/13/2010  . COLONIC POLYPS, HX OF 10/29/2008  . Benign neoplasm of colon 07/05/2007  . ACID REFLUX DISEASE 07/05/2007  . Knee pain, left 10/10/2010  . Tear of meniscus of left knee 2012  . HTN (hypertension) 07/14/2010  . Breast pain, left 11/17/2011  . Preventative health care 11/17/2011  .  Chest pain     a. Reportedly negative dobut echo performed prior to gastric bypass in 03/2011;  b. 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 12/2011 Cath: LM nl, LAD 50p, D1 17m, LCX min irregs, OM3 30, RCA 25p, 37m (FFR 0.99->0.89), PDA 30, EF 65%, Med Rx.  . Other and unspecified hyperlipidemia 11/16/2012  . Gout 03/04/2013  . GI bleed   . Atherosclerosis   . Anemia   . Hearing loss 05/24/2013    Previous audiology evaluation completely.   Past Surgical History  Procedure Laterality Date  . Ankle surgery  1994  . Tonsillectomy  age 51  . Colonoscopy polyps    . Knee arthroscopy  11/06/10    Left, torn meniscus (repaired)  . Colonoscopy    . Gastric bypass    . Hernia repair    . Upper gi endoscopy  03/27/13  . Esophagogastroduodenoscopy N/A 03/27/2013    Procedure: ESOPHAGOGASTRODUODENOSCOPY (EGD);  Surgeon: Irene Shipper, MD;  Location: [facility];  Service: Endoscopy;  Laterality: N/A;   Family History  Problem Relation Age of Onset  . Diabetes Mother   . Hypertension Mother   . Stroke Mother   . Hyperlipidemia Mother   . Hypertension Father   . Colon polyps Father   . Heart attack Father 30  . Stroke Father   . Heart attack Brother   . Diabetes Brother   . Heart disease Brother   . Heart attack Brother  Multiple  . Diabetes Brother   . Other Brother     heart problems  . Heart disease Brother   . Diabetes Sister   . Obesity Brother   . Diabetes Brother   . Hypertension Maternal Grandmother   . ADD / ADHD Daughter   . Stomach cancer Neg Hx    History  Substance Use Topics  . Smoking status: Former Smoker -- 1.50 packs/day for 20 years    Types: Cigarettes    Quit date: 04/21/1991  . Smokeless tobacco: Never Used  . Alcohol Use: 0.0 oz/week    0 drink(s) per week     Comment:  occasionaly- social    Review of Systems  Constitutional: Negative for fever.  HENT: Negative for rhinorrhea and sore throat.   Eyes: Negative for redness.  Respiratory: Negative for  cough.   Cardiovascular: Negative for chest pain.  Gastrointestinal: Positive for nausea and abdominal pain. Negative for vomiting and diarrhea.  Genitourinary: Positive for flank pain. Negative for dysuria.  Musculoskeletal: Negative for myalgias.  Skin: Negative for rash.  Neurological: Negative for headaches.      Allergies  Bee venom; Lipitor; and Morphine  Home Medications   Prior to Admission medications   Medication Sig Start Date End Date Taking? Authorizing Provider  B Complex Vitamins (VITAMIN-B COMPLEX) TABS Place 1 tablet under the tongue daily.   Yes Historical Provider, MD  CALCIUM CITRATE PO Take 1,500 mg by mouth daily.   Yes Historical Provider, MD  Cholecalciferol (VITAMIN D-3) 5000 UNITS TABS Take 1 tablet by mouth daily.   Yes Historical Provider, MD  Cyanocobalamin (VITAMIN B-12) 1000 MCG SUBL Place 1 each under the tongue daily.   Yes Historical Provider, MD  Ferrous Sulfate (RA IRON) 27 MG TABS Take 1 tablet by mouth 3 (three) times a week.    Yes Historical Provider, MD  Javier Docker Oil 1000 MG CAPS Take 2 tablets by mouth daily.   Yes Historical Provider, MD  metFORMIN (GLUCOPHAGE) 500 MG tablet Take 1 tablet (500 mg total) by mouth 3 (three) times daily. 11/22/13  Yes [name], MD  metoprolol tartrate (LOPRESSOR) 25 MG tablet TAKE ONE-HALF TABLET (12.5 MG) TWICE A DAY 10/18/13  Yes [name], MD  Multiple Vitamin (MULTIVITAMIN) tablet Take 2 tablets by mouth daily. BARIATRIC VIT   Yes Historical Provider, MD  pantoprazole (PROTONIX) 40 MG tablet Take 1 tablet (40 mg total) by mouth 2 (two) times daily. 05/22/13  Yes [name], MD  simvastatin (ZOCOR) 10 MG tablet TAKE 1 TABLET AT BEDTIME 11/22/13  Yes Debbrah Alar, NP  econazole nitrate 1 % cream Apply topically daily. 09/26/13   Debbrah Alar, NP   BP 162/84  Pulse 94  Temp(Src) 98.3 F (36.8 C) (Oral)  Resp 24  SpO2 97%  Physical Exam  Nursing note and vitals reviewed. Constitutional: He  appears well-developed and well-nourished.  HENT:  Head: Normocephalic and atraumatic.  Eyes: Conjunctivae are normal. Right eye exhibits no discharge. Left eye exhibits no discharge.  Neck: Normal range of motion. Neck supple.  Cardiovascular: Normal rate, regular rhythm and normal heart sounds.   Pulmonary/Chest: Effort normal and breath sounds normal.  Abdominal: Soft. There is tenderness in the right lower quadrant. There is CVA tenderness. There is no rigidity, no guarding, no tenderness at McBurney's point and negative Murphy's sign.  Neurological: He is alert.  Skin: Skin is warm and dry.  Psychiatric: He has a normal mood and affect.  ED Course  Procedures (including critical care time) Labs Review Labs Reviewed  CBC WITH DIFFERENTIAL - Abnormal; Notable for the following:    Hemoglobin 11.3 (*)    HCT 34.9 (*)    Neutrophils Relative % 79 (*)    Lymphocytes Relative 11 (*)    All other components within normal limits  URINALYSIS, ROUTINE W REFLEX MICROSCOPIC  COMPREHENSIVE METABOLIC PANEL    Imaging Review No results found.   EKG Interpretation None      12:58 AM Patient seen and examined. Work-up initiated. Medications ordered.   Vital signs reviewed and are as follows: BP 162/84  Pulse 94  Temp(Src) 98.3 F (36.8 C) (Oral)  Resp 24  SpO2 97%  Plan: labs, urine, pain control.   Previous CT 04/20/13 showed atherosclerosis but no aortic enlargement mentioned.   Handoff to Dammen PA-C at shift change.   MDM   Final diagnoses:  Flank pain   Pending completion of work-up.     Carlisle Cater, PA-C 12/03/13 [phone]

## 2013-12-03 NOTE — ED Notes (Signed)
Pt given ice chips

## 2013-12-03 NOTE — ED Provider Notes (Signed)
Brent [name] S 1:00 AM patient discussed and signed. Patient with history of flank pains related to possible intrarenal stones. Presenting with similar symptoms. Labs and urine pending. Patient otherwise appears well. Benign abdomen exam. If labs unremarkable and patient improved with medicine may be discharged home.  2:25 AM heart rate tests unremarkable. This time patient without signs for concerning or urgent condition. He may be discharged home to followup with his doctors.  Martie Lee, PA-C 12/03/13 [phone]

## 2013-12-03 NOTE — ED Notes (Signed)
Pt family member at bedside stated that line was contaminated that I just opened and stated that she was a Programme researcher, broadcasting/film/video and that I must wipe off port even when opened out of package

## 2013-12-05 ENCOUNTER — Encounter: Payer: Self-pay | Admitting: Family Medicine

## 2013-12-05 NOTE — Telephone Encounter (Signed)
Brent [name] w/[facility] states that this wasn't drawn due to the order stating [facility]? Brent [name] stated that the patient should of been asked to go to a [place] office?  I sent patient back a message asking if he was asked to go to another lab for this test?  This looks like this is the 2nd time this has happened (Feb 2015)?

## 2013-12-05 NOTE — Telephone Encounter (Signed)
FYI:  Everything is resulted for this patient except the lyme test?  I gave this to [facility] to look into this

## 2013-12-06 NOTE — Telephone Encounter (Signed)
No this did not get added. The patient is upset/as so am I, he has came in on 2 different occasions for this test along with other labs (other labs got done). Patient stated yesterday through mychart that he was not asked to go anywhere else for that test (he thought it was done)?

## 2013-12-08 ENCOUNTER — Other Ambulatory Visit: Payer: Self-pay | Admitting: Urology

## 2013-12-09 ENCOUNTER — Other Ambulatory Visit: Payer: Self-pay | Admitting: Family Medicine

## 2013-12-09 DIAGNOSIS — W57XXXA Bitten or stung by nonvenomous insect and other nonvenomous arthropods, initial encounter: Secondary | ICD-10-CM

## 2013-12-09 DIAGNOSIS — R51 Headache: Secondary | ICD-10-CM

## 2013-12-09 DIAGNOSIS — R5381 Other malaise: Secondary | ICD-10-CM

## 2013-12-09 DIAGNOSIS — R5383 Other fatigue: Principal | ICD-10-CM

## 2013-12-12 ENCOUNTER — Other Ambulatory Visit

## 2013-12-12 DIAGNOSIS — R51 Headache: Secondary | ICD-10-CM

## 2013-12-12 DIAGNOSIS — W57XXXA Bitten or stung by nonvenomous insect and other nonvenomous arthropods, initial encounter: Secondary | ICD-10-CM

## 2013-12-12 DIAGNOSIS — R5381 Other malaise: Secondary | ICD-10-CM

## 2013-12-12 DIAGNOSIS — R5383 Other fatigue: Principal | ICD-10-CM

## 2013-12-13 ENCOUNTER — Encounter (HOSPITAL_COMMUNITY): Payer: Self-pay | Admitting: *Deleted

## 2013-12-14 LAB — LYME AB/WESTERN BLOT REFLEX: B burgdorferi Ab IgG+IgM: 0.48 {ISR}

## 2013-12-15 NOTE — H&P (Signed)
Reason For Visit Seen today for flank pain.     Active Problems Problems  1. Calculus of right ureter (592.1)   Assessed By: Jimmey Ralph (Urology); Last Assessed: 07 Dec 2013 2. Hydronephrosis, right (591)   Assessed By: Jimmey Ralph (Urology); Last Assessed: 07 Dec 2013 3. Nephrolithiasis (592.0)   Assessed By: Jimmey Ralph (Urology); Last Assessed: 07 Dec 2013 4. Right flank pain (789.09)   Assessed By: Jimmey Ralph (Urology); Last Assessed: 07 Dec 2013  History of Present Illness       58 YO male patient of Dr. Cy Blamer seen today for flank pain.    GU HX:    Referral from urgent care for bilateral nephrolithiasis and recent urinary tract infection. At one point he was referred to our office several months ago by Dr Vivien Rossetti. The patient says he was unaware of that appointment and never came in for any assessment. He has had several CT's over the last year or two, which have showed nonobstructing bilateral renal calculi. May 2015 he began experiencing some right lower flank and back discomfort with radiation across his abdomen. He thought his urine was dark and may have contained blood. There was some increase in frequency and urgency. At Urgent Care, he did have microhematuria and pyuria. He was started on ciprofloxacin. Urine culture ended up showing Klebsiella which was fairly pan-sensitive. He had been taking several days of ciprofloxacin and had improved. A repeat CT scan was performed. Again, he was confirmed to have some bilateral nephrolithiasis but no ureteral calculi and nothing that was obstructing. The patient was felt to have a stone or several stones in the lower pole of his right kidney, collectively measuring up to 8 mm. The tiny 1 mm stone in the left kidney. He has had no other systemic complaints.      Returned November 07, 2013 due to a recurrent episode of gross hematuria. He noticed that on an extended drive to [place] and on return trip that  he noticed some pink coloration to his urine. He had no other flank or abdominal pain or other change in voiding patterns. The hematuria subsequently resolved and he remains relatively asymptomatic. Urinalysis showed a low level of microhematuria. He is known to have bilateral nonobstructing nephrolithiasis. He also more recently had a positive urine culture. Urine showed no evidence of ongoing bacteruria or pyuria. Have gone back and reviewed his previous imaging studies. Again, he has had both a contrast and a noncontrast CT of the abdomen and pelvis over the past 12 months. Re-reviewed his CT with contrast done in December of 2014. Other than the nonobstructing renal calculi, there were no other urologic concerns. Bladder was fairly distended without contract, but there was no evidence of any bladder masses or other abnormalities.    Interval HX:   Today states he has again been to ER over weekend for right flank/abdominal pain which hydrocodone is not well controlling. No imagining done at time of ER visit but at this time he is very flusterated with continual pain and states "I have to have something done!" Denies gross hematuria, n/v, or f/c today.   Past Medical History Problems  1. History of arthritis (V13.4) 2. History of bleeding disorder (V12.3) 3. History of cardiac disorder (V12.50) 4. History of diabetes mellitus (V12.29) 5. History of gastric ulcer (V12.79) 6. History of gout (V12.29) 7. History of heartburn (V12.79) 8. History of hyperlipidemia (V12.29) 9. History of hypertension (V12.59) 10. History of sleep apnea (  V13.89)  Surgical History Problems  1. History of Ankle Surgery 2. History of Gastric Surgery For Morbid Obesity Gastric Bypass 3. History of Knee Surgery 4. History of Tonsillectomy 5. History of Umbilical Hernia Repair  Current Meds 1. Calcium TABS;  Therapy: (Recorded:11Jun2015) to Recorded 2. Ferrous Sulfate 325 (65 Fe) MG Oral Tablet;  Therapy:  (Recorded:11Jun2015) to Recorded 3. Krill Oil 1000 MG Oral Capsule;  Therapy: (Recorded:11Jun2015) to Recorded 4. MetFORMIN HCl - 500 MG Oral Tablet;  Therapy: (Recorded:11Jun2015) to Recorded 5. Metoprolol Tartrate 25 MG Oral Tablet;  Therapy: (Recorded:11Jun2015) to Recorded 6. Multi-Day TABS;  Therapy: (Recorded:11Jun2015) to Recorded 7. Oxycodone-Acetaminophen 5-325 MG Oral Tablet;  Therapy: (Recorded:11Jun2015) to Recorded 8. Pantoprazole Sodium 40 MG Oral Tablet Delayed Release;  Therapy: (Recorded:11Jun2015) to Recorded 9. Simvastatin 5 MG Oral Tablet;  Therapy: (Recorded:11Jun2015) to Recorded 10. Triglide TABS;   Therapy: (Recorded:23Jun2015) to Recorded 11. Vitamin B-12 1000 MCG/ML SOLN;   Therapy: (Recorded:11Jun2015) to Recorded 12. Vitamin D (Ergocalciferol) 50000 UNIT Oral Capsule;   Therapy: (Recorded:11Jun2015) to Recorded 13. Vitamin E TABS;   Therapy: (Recorded:20Aug2015) to Recorded 14. Zyrtec 10 MG TABS;   Therapy: (Recorded:11Jun2015) to Recorded  Allergies Medication  1. Morphine Derivatives 2. Statins  Family History Problems  1. Family history of cardiac disorder (V17.49) : Brother 2. Family history of diabetes mellitus (V18.0) : Mother, Brother, Sister 3. Family history of hyperlipidemia (V18.19) : Mother 4. Family history of hypertension (V17.49) : Mother 5. Family history of myocardial infarction (V17.3) : Father, Brother 6. Family history of stroke (V17.1) : Mother, Father  Social History Problems  1. Alcohol use (V49.89)   1 qd 2. Caffeine use (V49.89)   5-10qd 3. Father deceased   53yrs, stroke 4. Former smoker (V15.82) 5. Married 6. Mother deceased   32yrs, stroke 58. Occupation   Pharmacist, hospital 8. Two children  Review of Systems Genitourinary, constitutional, skin, eye, otolaryngeal, hematologic/lymphatic, cardiovascular, pulmonary, endocrine, musculoskeletal, gastrointestinal, neurological and psychiatric system(s) were reviewed  and pertinent findings if present are noted.  Gastrointestinal: flank pain and abdominal pain.    Vitals Vital Signs [Data Includes: Last 1 Day]  Recorded: 20Aug2015 02:36PM  Height: 5 ft 8.5 in Weight: 245 lb  BMI Calculated: 36.71 BSA Calculated: 2.24 Blood Pressure: 146 / 80 Temperature: 98.1 F Heart Rate: 78  Physical Exam Constitutional: Well nourished and well developed . No acute distress. The patient appears well hydrated.  Abdomen: The abdomen is obese. The abdomen is soft and nontender. No tenderness in the RLQ and no LLQ tenderness. No CVA tenderness.  Skin: Normal skin turgor and normal skin color and pigmentation.    Results/Data Urine [Data Includes: Last 1 Day]   20Aug2015  COLOR YELLOW   APPEARANCE CLEAR   SPECIFIC GRAVITY 1.010   pH 6.0   GLUCOSE NEG mg/dL  BILIRUBIN NEG   KETONE NEG mg/dL  BLOOD NEG   PROTEIN NEG mg/dL  UROBILINOGEN 0.2 mg/dL  NITRITE NEG   LEUKOCYTE ESTERASE NEG    The following images/tracing/specimen were independently visualized:  CT urogram: shows small nonobstructing left renal calculus. Moderate hydronephrosis with perinephric stranding secondary to large 8-9 mm right UPJ calculus. Dr. Risa Grill has reviwed CT. HFU: 790.  The following clinical lab reports were reviewed:  UA- negative. Selected Results  AU CT-STONE PROTOCOL 20Aug2015 12:00AM Jimmey Ralph   Test Name Result Flag Reference  CT-STONE PROTOCOL (Report)    ** RADIOLOGY REPORT BY [facility] **   CLINICAL DATA: Right flank and right lower quadrant  abdominal pain. Gross and micro hematuria. History of renal calculi.  EXAM: CT ABDOMEN AND PELVIS WITHOUT CONTRAST (URINARY CALCULUS PROTOCOL)  TECHNIQUE: Multidetector CT imaging was performed through the abdomen and pelvis without intravenous contrast to include the urinary tract.  COMPARISON: Prior examination 04/20/2013.  FINDINGS: Lung bases: Clear. No significant pleural or pericardial  effusion. Coronary artery calcifications and a small hiatal hernia are noted.  Kidneys / Ureters / Bladder: The right ureter is obstructed by a 10 mm calculus at the ureteral pelvic junction. This is visualized on the scout image. There is associated hydronephrosis and asymmetric perinephric soft tissue stranding. Within the lower pole of the left kidney, there is a nonobstructing 4 mm calculus. There is no hydronephrosis on the left. No distal ureteral or bladder calculi are demonstrated. The bladder is poorly distended, likely accounting for mild prominence of its wall.  Other Solid Abdominal Viscera: As evaluated in the noncontrast state, the liver demonstrates no abnormality. No evidence of gallstones, gallbladder wall thickening or biliary dilatation. The pancreas appears normal. The spleen and adrenal glands appear normal.  Bowel/Mesentery: There are stable postsurgical changes status post gastric bypass. Stool is present throughout the colon. No inflammatory changes are observed. There is no ascites.  Retroperitoneum/Pelvis: There are no enlarged abdominal or pelvic lymph nodes. There is stable mild aortic atherosclerosis. The prostate gland appears unremarkable. No abdominal wall masses or hernias.  Bones / Musculoskeletal: No acute or significant osseus findings.  IMPRESSION: 1. Obstructing 10 mm calculus at the right ureteral pelvic junction. 2. Nonobstructing 4 mm calculus in the lower pole of the left kidney. 3. Prior gastric bypass without demonstrated complication. No other acute findings.   Electronically Signed  By: Camie Patience M.D.  On: 12/07/2013 16:46   Assessment Assessed  1. Nephrolithiasis (592.0) 2. Right flank pain (789.09) 3. Calculus of right ureter (592.1) 4. Hydronephrosis, right (591)  Plan Health Maintenance  1. UA With REFLEX; [Do Not Release]; Status:Complete;   Done: 01EOF1219 02:29PM Right flank pain  2. Start:  Hydrocodone-Acetaminophen 7.5-325 MG Oral Tablet; TAKE 1 TO 2 TABLETS  EVERY 4 TO 6 HOURS AS NEEDED FOR PAIN 3. Administered: Ketorolac Tromethamine 60 MG/2ML Injection Solution 4. AU CT-STONE PROTOCOL; Status:Complete;   Done: 20Aug2015 12:00AM  Ketorolac 60 mg IM today    Renewed: Hydrocodone 7.5/325 mg 1-2 po Q4-6 hrs prn #40/-0RF  Will proceed with ESWL 12/18/13 w/Dr. Risa Grill. Risks and benefits discussed: possible renal hematoma, risk of need for second procedure if stone does not breakup easily, bleeding, or infection. All questions addressed and answered to pt's satisfaction and wishes to proceed.   Signatures Electronically signed by : Jimmey Ralph, ANP-C; Dec 07 2013  5:05PM EST

## 2013-12-18 ENCOUNTER — Encounter (HOSPITAL_COMMUNITY): Payer: Self-pay

## 2013-12-18 ENCOUNTER — Ambulatory Visit (HOSPITAL_COMMUNITY)
Admission: RE | Admit: 2013-12-18 | Discharge: 2013-12-18 | Disposition: A | Discharge status: Home / Self Care | Source: Ambulatory Visit | Attending: Urology | Admitting: Urology

## 2013-12-18 ENCOUNTER — Encounter (HOSPITAL_COMMUNITY)
Admission: RE | Disposition: A | Discharge status: Home / Self Care | Payer: Self-pay | Source: Ambulatory Visit | Attending: Urology

## 2013-12-18 ENCOUNTER — Ambulatory Visit (HOSPITAL_COMMUNITY)

## 2013-12-18 DIAGNOSIS — E119 Type 2 diabetes mellitus without complications: Secondary | ICD-10-CM | POA: Insufficient documentation

## 2013-12-18 DIAGNOSIS — Z885 Allergy status to narcotic agent status: Secondary | ICD-10-CM | POA: Insufficient documentation

## 2013-12-18 DIAGNOSIS — I10 Essential (primary) hypertension: Secondary | ICD-10-CM | POA: Insufficient documentation

## 2013-12-18 DIAGNOSIS — Z87891 Personal history of nicotine dependence: Secondary | ICD-10-CM | POA: Insufficient documentation

## 2013-12-18 DIAGNOSIS — Z9884 Bariatric surgery status: Secondary | ICD-10-CM | POA: Diagnosis not present

## 2013-12-18 DIAGNOSIS — M129 Arthropathy, unspecified: Secondary | ICD-10-CM | POA: Diagnosis not present

## 2013-12-18 DIAGNOSIS — E785 Hyperlipidemia, unspecified: Secondary | ICD-10-CM | POA: Insufficient documentation

## 2013-12-18 DIAGNOSIS — G4730 Sleep apnea, unspecified: Secondary | ICD-10-CM | POA: Insufficient documentation

## 2013-12-18 DIAGNOSIS — N200 Calculus of kidney: Secondary | ICD-10-CM | POA: Diagnosis not present

## 2013-12-18 DIAGNOSIS — Z79899 Other long term (current) drug therapy: Secondary | ICD-10-CM | POA: Diagnosis not present

## 2013-12-18 DIAGNOSIS — M109 Gout, unspecified: Secondary | ICD-10-CM | POA: Diagnosis not present

## 2013-12-18 DIAGNOSIS — N201 Calculus of ureter: Secondary | ICD-10-CM | POA: Insufficient documentation

## 2013-12-18 HISTORY — DX: Calculus of kidney: N20.0

## 2013-12-18 LAB — GLUCOSE, CAPILLARY
Glucose-Capillary: 159 mg/dL — ABNORMAL HIGH (ref 70–99)
Glucose-Capillary: 211 mg/dL — ABNORMAL HIGH (ref 70–99)

## 2013-12-18 SURGERY — LITHOTRIPSY, ESWL
Anesthesia: LOCAL | Laterality: Right

## 2013-12-18 MED ORDER — DIPHENHYDRAMINE HCL 25 MG PO CAPS
25.0000 mg | ORAL_CAPSULE | ORAL | Status: AC
  Administered 2013-12-18: 25 mg via ORAL
  Filled 2013-12-18: qty 1

## 2013-12-18 MED ORDER — DIAZEPAM 5 MG PO TABS
10.0000 mg | ORAL_TABLET | ORAL | Status: AC
  Administered 2013-12-18: 10 mg via ORAL
  Filled 2013-12-18: qty 2

## 2013-12-18 MED ORDER — HYDROCODONE-ACETAMINOPHEN 7.5-325 MG PO TABS: 1.0000 | ORAL_TABLET | Freq: Four times a day (QID) | ORAL | Status: DC | PRN

## 2013-12-18 MED ORDER — CIPROFLOXACIN HCL 500 MG PO TABS
500.0000 mg | ORAL_TABLET | ORAL | Status: AC
  Administered 2013-12-18: 500 mg via ORAL
  Filled 2013-12-18: qty 1

## 2013-12-18 MED ORDER — DEXTROSE-NACL 5-0.45 % IV SOLN
INTRAVENOUS | Status: DC
  Administered 2013-12-18: 10:00:00 via INTRAVENOUS

## 2013-12-18 MED ORDER — HYDROCODONE-ACETAMINOPHEN 7.5-325 MG PO TABS
1.0000 | ORAL_TABLET | ORAL | Status: DC
  Administered 2013-12-18: 1 via ORAL
  Filled 2013-12-18: qty 1

## 2013-12-18 NOTE — Interval H&P Note (Signed)
History and Physical Interval Note:  12/18/2013 12:29 PM  Brent [name]  has presented today for surgery, with the diagnosis of Right Ureteral Pelvic Junction Stone  The various methods of treatment have been discussed with the patient and family. After consideration of risks, benefits and other options for treatment, the patient has consented to  Procedure(s): RIGHT EXTRACORPOREAL SHOCK WAVE LITHOTRIPSY (ESWL) (Right) as a surgical intervention .  The patient's history has been reviewed, patient examined, no change in status, stable for surgery.  I have reviewed the patient's chart and labs.  Questions were answered to the patient's satisfaction.     Severina Sykora S

## 2013-12-18 NOTE — Op Note (Signed)
See [facility] OP note scanned into chart. 

## 2013-12-18 NOTE — Discharge Instructions (Signed)
See [facility] discharge instructions in chart.  

## 2014-01-04 ENCOUNTER — Encounter: Payer: Self-pay | Admitting: Internal Medicine

## 2014-03-12 ENCOUNTER — Other Ambulatory Visit: Payer: Self-pay | Admitting: Family Medicine

## 2014-03-12 NOTE — Telephone Encounter (Signed)
Medication   metoprolol tartrate (LOPRESSOR) 25 MG tablet [[id number]]       metoprolol tartrate (LOPRESSOR) 25 MG tablet [[id number]] DISCONTINUED     Order Details    Dose, Route, Frequency: As Directed    Dispense Quantity:  90 tablet Refills:  1 Fills Remaining:  1          Sig: TAKE ONE-HALF TABLET (12.5 MG) TWICE A DAY         Discontinue Date:  12/12/2013 Hooper:  Ricard Dillon, CPHT Discontinue Reason:  Entry Error   Written Date:  10/18/13 Expiration Date:  10/18/14     Start Date:  10/18/13 End Date:  12/12/13     Ordering Provider:  [name], MD Authorizing Provider:  [name], MD Ordering User:  Ronny Flurry, CMA                    Original Order:  metoprolol tartrate (LOPRESSOR) 25 MG tablet [[id number]]        Pharmacy:  [facility]        Rx request to pharmacy/SL[facility]

## 2014-03-22 ENCOUNTER — Other Ambulatory Visit: Payer: Self-pay | Admitting: Family Medicine

## 2014-03-29 ENCOUNTER — Encounter (HOSPITAL_COMMUNITY): Payer: Self-pay | Admitting: Cardiology

## 2014-04-14 ENCOUNTER — Other Ambulatory Visit: Payer: Self-pay | Admitting: Family

## 2014-04-24 ENCOUNTER — Telehealth: Payer: Self-pay | Admitting: Family Medicine

## 2014-04-24 DIAGNOSIS — I10 Essential (primary) hypertension: Secondary | ICD-10-CM

## 2014-04-24 DIAGNOSIS — E785 Hyperlipidemia, unspecified: Secondary | ICD-10-CM

## 2014-04-24 DIAGNOSIS — E1100 Type 2 diabetes mellitus with hyperosmolarity without nonketotic hyperglycemic-hyperosmolar coma (NKHHC): Secondary | ICD-10-CM

## 2014-04-24 NOTE — Telephone Encounter (Signed)
Caller name:Brent [name] Relation to QJ:JHER Call back Lamar:  Reason for call: pt states dr. Charlett Blake informed him to call to have his orders put in for labs prior to his cpe on 05/08/14

## 2014-04-25 NOTE — Telephone Encounter (Signed)
Please order him a LIpid, CMP, CBC, TSH and hgba1c  For diabetes, HTN and hyperlipidemia, can do them here or at [facility] at his discretion.

## 2014-04-26 ENCOUNTER — Other Ambulatory Visit: Payer: Self-pay

## 2014-05-01 ENCOUNTER — Telehealth: Payer: Self-pay | Admitting: Physician Assistant

## 2014-05-01 NOTE — Telephone Encounter (Signed)
Pt made aware not able to get earlier appointment than Friday and will be here for schedule appointment.

## 2014-05-01 NOTE — Telephone Encounter (Signed)
Spoke with pt as he needs surgical clearance for the anaesthesiologist for his scheduled knee surgery on Friday, 1/15.  Pt is scheduled for appointment with [facility] on 1/15 at 11 am and needs to be seen before to keep with surgery schedule. Pt saw C. Berge in 12/2011 for office visit after left heart cath but has not been seen in our office since then.   Please review and advise if able to see pt before Friday for his surgical clearance.

## 2014-05-01 NOTE — Telephone Encounter (Signed)
Request for surgical clearance:  1. What type of surgery is being performed? Knee Surgery  2. When is this surgery scheduled? 05/04/2014  3. Are there any medications that need to be held prior to surgery and how long?He is not sure  4. Name of physician performing surgery? [facility] (Pt didn't disclose Doctor)   5. What is your office phone and fax number? Office # [phone] please speak with Rep Danielle  Comment: Made appt with PA, [facility] on Friday. Pt notes he will need a sooner appt//sr

## 2014-05-01 NOTE — Telephone Encounter (Signed)
I saw him one time only as a hospital consult.  Never saw in office.  Cannot clear for surgery if I have not seen him except for 1 time in the hospital in 2013.  He can come in to see PA some time this week if he can be fit in.

## 2014-05-04 ENCOUNTER — Ambulatory Visit (INDEPENDENT_AMBULATORY_CARE_PROVIDER_SITE_OTHER): Admitting: Physician Assistant

## 2014-05-04 ENCOUNTER — Encounter: Payer: Self-pay | Admitting: Physician Assistant

## 2014-05-04 ENCOUNTER — Other Ambulatory Visit (INDEPENDENT_AMBULATORY_CARE_PROVIDER_SITE_OTHER)

## 2014-05-04 VITALS — BP 140/60 | HR 91 | Ht 68.0 in | Wt 254.0 lb

## 2014-05-04 DIAGNOSIS — E119 Type 2 diabetes mellitus without complications: Secondary | ICD-10-CM

## 2014-05-04 DIAGNOSIS — E785 Hyperlipidemia, unspecified: Secondary | ICD-10-CM

## 2014-05-04 DIAGNOSIS — I10 Essential (primary) hypertension: Secondary | ICD-10-CM

## 2014-05-04 DIAGNOSIS — I2583 Coronary atherosclerosis due to lipid rich plaque: Secondary | ICD-10-CM

## 2014-05-04 DIAGNOSIS — Z01818 Encounter for other preprocedural examination: Secondary | ICD-10-CM

## 2014-05-04 DIAGNOSIS — E1100 Type 2 diabetes mellitus with hyperosmolarity without nonketotic hyperglycemic-hyperosmolar coma (NKHHC): Secondary | ICD-10-CM

## 2014-05-04 DIAGNOSIS — I2510 Atherosclerotic heart disease of native coronary artery without angina pectoris: Secondary | ICD-10-CM

## 2014-05-04 LAB — COMPREHENSIVE METABOLIC PANEL
ALT: 19 U/L (ref 0–53)
AST: 21 U/L (ref 0–37)
Albumin: 4.2 g/dL (ref 3.5–5.2)
Alkaline Phosphatase: 41 U/L (ref 39–117)
BUN: 18 mg/dL (ref 6–23)
CO2: 27 mEq/L (ref 19–32)
Calcium: 9.3 mg/dL (ref 8.4–10.5)
Chloride: 105 mEq/L (ref 96–112)
Creatinine, Ser: 0.92 mg/dL (ref 0.40–1.50)
GFR: 89.63 mL/min (ref >60.00)
Glucose, Bld: 203 mg/dL — ABNORMAL HIGH (ref 70–99)
Potassium: 4.4 mEq/L (ref 3.5–5.1)
Sodium: 138 mEq/L (ref 135–145)
Total Bilirubin: 0.5 mg/dL (ref 0.2–1.2)
Total Protein: 6.6 g/dL (ref 6.0–8.3)

## 2014-05-04 LAB — CBC WITH DIFFERENTIAL/PLATELET
Basophils Absolute: 0 10*3/uL (ref 0.0–0.1)
Basophils Relative: 0.6 % (ref 0.0–3.0)
Eosinophils Absolute: 0.1 10*3/uL (ref 0.0–0.7)
Eosinophils Relative: 2.4 % (ref 0.0–5.0)
HCT: 37.4 % — ABNORMAL LOW (ref 39.0–52.0)
Hemoglobin: 12.1 g/dL — ABNORMAL LOW (ref 13.0–17.0)
Lymphocytes Relative: 24.7 % (ref 12.0–46.0)
Lymphs Abs: 1.4 10*3/uL (ref 0.7–4.0)
MCHC: 32.3 g/dL (ref 30.0–36.0)
MCV: 83.2 fl (ref 78.0–100.0)
Monocytes Absolute: 0.4 10*3/uL (ref 0.1–1.0)
Monocytes Relative: 6.7 % (ref 3.0–12.0)
Neutro Abs: 3.6 10*3/uL (ref 1.4–7.7)
Neutrophils Relative %: 65.6 % (ref 43.0–77.0)
Platelets: 255 10*3/uL (ref 150.0–400.0)
RBC: 4.5 Mil/uL (ref 4.22–5.81)
RDW: 14.6 % (ref 11.5–15.5)
WBC: 5.5 10*3/uL (ref 4.0–10.5)

## 2014-05-04 LAB — LIPID PANEL
Cholesterol: 147 mg/dL (ref 0–200)
HDL: 43.3 mg/dL (ref >39.00)
LDL Cholesterol: 76 mg/dL (ref 0–99)
NonHDL: 103.7
Total CHOL/HDL Ratio: 3
Triglycerides: 141 mg/dL (ref 0.0–149.0)
VLDL: 28.2 mg/dL (ref 0.0–40.0)

## 2014-05-04 LAB — TSH: TSH: 1.43 u[IU]/mL (ref 0.35–4.50)

## 2014-05-04 LAB — HEMOGLOBIN A1C: Hgb A1c MFr Bld: 7.6 % — ABNORMAL HIGH (ref 4.6–6.5)

## 2014-05-04 NOTE — Assessment & Plan Note (Signed)
Needs nutrition consult.

## 2014-05-04 NOTE — Assessment & Plan Note (Signed)
We'll schedule Lexiscan Myoview as soon as possible. The patient's knee surgery will be scheduled soon as the results are known.

## 2014-05-04 NOTE — Assessment & Plan Note (Signed)
Last A1C 7.0

## 2014-05-04 NOTE — Assessment & Plan Note (Signed)
Heart catheterization September 2013 revealed a 70% mid RCA which was not significant by FFR.  Interestingly his EKG now shows  Q waves in III and aVF and QR in V1.  Patient has had no follow-up since. Cardiac risk factors include hypertension, HLD, obesity and diabetes.  He does not complain of angina but also is not active beyond going to work.   Lexiscan for preop.

## 2014-05-04 NOTE — Progress Notes (Signed)
Patient ID: Brent [name], male   DOB: [birth year], 59 y.o.   MRN: [id number]     Date:  05/04/2014   ID:  Brent [name], DOB 07/24/1955, MRN [id number]  PCP:  [name], MD  Primary Cardiologist:  Aundra Dubin   Chief Complaint  Patient presents with  . EKG PERFORMED    SURG CLEARANCE     History of Present Illness: Brent [name] is a 59 y.o. male  with a history of obesity, hypertension, coronary artery disease, diabetes mellitus type 2, hyperlipidemia, sleep apnea on CPAP. He was admitted in early September 2013 with chest pain which was somewhat constant. He ruled out for MI. He underwent CT angiography which suggested moderate right coronary artery stenosis. This led to a diagnostic catheterization which showed a 70% mid RCA stenosis and otherwise nonobstructive disease. Fractional flow reserve was performed within the right coronary artery and was normal at 0.99 to and 0.89 following adenosine. He was medically managed.  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  He now needs a meniscal repair on his right knee and presents for preoperative clearance.  Surgery was supposed be scheduled for today however was postponed until clearance could be given. He is not been seen in this clinic since September 2013 which was right after his heart catheterization. He reports minimal activity. He denies any chest pain or shortness of breath beyond baseline.  He also denies nausea, vomiting, fever, chest pain, shortness of breath, orthopnea, dizziness, PND, cough, congestion, abdominal pain, hematochezia, melena, lower extremity edema, claudication.  Last A1c was 7.  Wt Readings from Last 3 Encounters:  05/04/14 254 lb (115.214 kg)  12/18/13 247 lb 2 oz (112.095 kg)  11/21/13 252 lb 0.6 oz (114.325 kg)     Past Medical History  Diagnosis Date  . DM (diabetes mellitus), type 2, uncontrolled   . Acid reflux disease   . Sleep apnea     a. CPAP  . Hx of colonic polyps   .  Diverticulosis of colon   . Hypertension 07/14/2010  . Morbid obesity 03/27/2010    a. 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  . 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 HYPERSOMNIA, ASSOCIATED WITH SLEEP APNEA 07/26/2008  . DIVERTICULOSIS, COLON 10/29/2008  . Diarrhea 06/13/2010  . COLONIC POLYPS, HX OF 10/29/2008  . Benign neoplasm of colon 07/05/2007  . ACID REFLUX DISEASE 07/05/2007  . Knee pain, left 10/10/2010  . Tear of meniscus of left knee 2012  . HTN (hypertension) 07/14/2010  . Breast pain, left 11/17/2011  . Preventative health care 11/17/2011  . Chest pain     a. Reportedly negative dobut echo performed prior to gastric bypass in 03/2011;  b. 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 12/2011 Cath: LM nl, LAD 50p, D1 47m, LCX min irregs, OM3 30, RCA 25p, 46m (FFR 0.99->0.89), PDA 30, EF 65%, Med Rx.  . Other and unspecified hyperlipidemia 11/16/2012  . Gout 03/04/2013  . GI bleed   . Atherosclerosis   . Anemia   . Hearing loss 05/24/2013    Previous audiology evaluation completely.  . Renal stone 11/2013    Current Outpatient Prescriptions  Medication Sig Dispense Refill  . B Complex Vitamins (VITAMIN-B COMPLEX) TABS Place 1 tablet under the tongue daily.    . Cholecalciferol (VITAMIN D-3) 5000 UNITS TABS Take 1 tablet by mouth daily.    . Cyanocobalamin (VITAMIN B-12) 1000 MCG SUBL Place 1 each under the tongue daily.    [name]  fenofibrate 160 MG tablet Take 160 mg by mouth daily.    . Ferrous Sulfate (RA IRON) 27 MG TABS Take 1 tablet by mouth 3 (three) times a week.     Javier Docker Oil 1000 MG CAPS Take 2 tablets by mouth daily.    . metFORMIN (GLUCOPHAGE) 500 MG tablet Take 500 mg by mouth 3 (three) times daily after meals.    . metoprolol tartrate (LOPRESSOR) 25 MG tablet TAKE ONE-HALF (1/2) TABLET (12.5 MG) TWICE A DAY 90 tablet 0  . Multiple Vitamin (MULTIVITAMIN) tablet Take 2 tablets by mouth daily. BARIATRIC VIT    . pantoprazole (PROTONIX) 40 MG tablet TAKE 1 TABLET TWICE A DAY 180 tablet 2  .  simvastatin (ZOCOR) 10 MG tablet TAKE 1 TABLET AT BEDTIME 90 tablet 0  . [DISCONTINUED] ramipril (ALTACE) 10 MG tablet Take 1 tablet (10 mg total) by mouth daily. 2 tabs po daily 180 tablet 1   No current facility-administered medications for this visit.    Allergies:    Allergies  Allergen Reactions  . Bee Venom Anaphylaxis  . Lipitor [Atorvastatin]     Myalgias, memory changes  . Morphine     hyperactive    Social History:  The patient  reports that he quit smoking about 23 years ago. His smoking use included Cigarettes. He has a 30 pack-year smoking history. He has never used smokeless tobacco. He reports that he drinks alcohol. He reports that he does not use illicit drugs.   Family history:   Family History  Problem Relation Age of Onset  . Diabetes Mother   . Hypertension Mother   . Stroke Mother   . Hyperlipidemia Mother   . Hypertension Father   . Colon polyps Father   . Heart attack Father 44  . Stroke Father   . Heart attack Brother   . Diabetes Brother   . Heart disease Brother   . Heart attack Brother     Multiple  . Diabetes Brother   . Other Brother     heart problems  . Heart disease Brother   . Diabetes Sister   . Obesity Brother   . Diabetes Brother   . Hypertension Maternal Grandmother   . ADD / ADHD Daughter   . Stomach cancer Neg Hx     ROS:  Please see the history of present illness.  All other systems reviewed and negative.   PHYSICAL EXAM: VS:  BP 140/60 mmHg  Pulse 91  Ht 5\' 8"  (1.727 m)  Wt 254 lb (115.214 kg)  BMI 38.63 kg/m2 Obese, well developed, in no acute distress HEENT: Pupils are equal round react to light accommodation extraocular movements are intact.  Neck: no JVDNo cervical lymphadenopathy. Cardiac: Regular rate and rhythm without murmurs rubs or gallops. Lungs:  clear to auscultation bilaterally, no wheezing, rhonchi or rales Abd: soft, nontender, positive bowel sounds all quadrants, no hepatosplenomegaly Ext: no lower  extremity edema.  2+ radial and dorsalis pedis pulses. Skin: warm and dry Neuro:  Grossly normal  EKG:  Sinus rhythm rate 91 bpm new inferior Q waves and QR pattern in V1    ASSESSMENT AND PLAN:  Problem List Items Addressed This Visit    Preoperative clearance - Primary    We'll schedule Lexiscan Myoview as soon as possible. The patient's knee surgery will be scheduled soon as the results are known.       Relevant Orders   EKG 12-Lead   Myocardial Perfusion Imaging  Morbid obesity    Needs nutrition consult.      HTN (hypertension)    Mildly elevated today but he states that it usually runs about 120/70. No changes to current medication.      Diabetes type 2, controlled    Last A1C 7.0      CAD (coronary artery disease)    Heart catheterization September 2013 revealed a 70% mid RCA which was not significant by FFR.  Interestingly his EKG now shows  Q waves in III and aVF and QR in V1.  Patient has had no follow-up since. Cardiac risk factors include hypertension, HLD, obesity and diabetes.  He does not complain of angina but also is not active beyond going to work.   Lexiscan for preop.

## 2014-05-04 NOTE — Patient Instructions (Signed)
Your physician recommends that you continue on your current medications as directed. Please refer to the Current Medication list given to you today.  Your physician has requested that you have a lexiscan myoview. For further information please visit [email or web address]. Please follow instruction sheet, as given. ( To be scheduled ASAP for pre-op clearance)  Your physician wants you to follow-up in: 6 months with Dr.Mclean You will receive a reminder letter in the mail two months in advance. If you don't receive a letter, please call our office to schedule the follow-up appointment.

## 2014-05-04 NOTE — Assessment & Plan Note (Signed)
Mildly elevated today but he states that it usually runs about 120/70. No changes to current medication.

## 2014-05-08 ENCOUNTER — Other Ambulatory Visit: Payer: Self-pay | Admitting: Family Medicine

## 2014-05-08 ENCOUNTER — Ambulatory Visit (INDEPENDENT_AMBULATORY_CARE_PROVIDER_SITE_OTHER): Admitting: Family Medicine

## 2014-05-08 ENCOUNTER — Encounter: Payer: Self-pay | Admitting: Family Medicine

## 2014-05-08 VITALS — BP 121/71 | HR 80 | Temp 98.4°F | Ht 67.0 in | Wt 260.4 lb

## 2014-05-08 DIAGNOSIS — E119 Type 2 diabetes mellitus without complications: Secondary | ICD-10-CM

## 2014-05-08 DIAGNOSIS — I2583 Coronary atherosclerosis due to lipid rich plaque: Secondary | ICD-10-CM

## 2014-05-08 DIAGNOSIS — Z23 Encounter for immunization: Secondary | ICD-10-CM

## 2014-05-08 DIAGNOSIS — E1169 Type 2 diabetes mellitus with other specified complication: Secondary | ICD-10-CM

## 2014-05-08 DIAGNOSIS — I2510 Atherosclerotic heart disease of native coronary artery without angina pectoris: Secondary | ICD-10-CM

## 2014-05-08 DIAGNOSIS — Z0000 Encounter for general adult medical examination without abnormal findings: Secondary | ICD-10-CM

## 2014-05-08 DIAGNOSIS — K219 Gastro-esophageal reflux disease without esophagitis: Secondary | ICD-10-CM

## 2014-05-08 DIAGNOSIS — I10 Essential (primary) hypertension: Secondary | ICD-10-CM

## 2014-05-08 DIAGNOSIS — E669 Obesity, unspecified: Secondary | ICD-10-CM

## 2014-05-08 MED ORDER — METFORMIN HCL ER (OSM) 1000 MG PO TB24: 1000.0000 mg | ORAL_TABLET | Freq: Every day | ORAL | Status: DC

## 2014-05-08 NOTE — Patient Instructions (Addendum)
Curcumen/Turmeric caps daily for inflammation  Check with insurance to clarify if they will pay for zostavax/shingles shot    Basic Carbohydrate Counting for Diabetes Mellitus Carbohydrate counting is a method for keeping track of the amount of carbohydrates you eat. Eating carbohydrates naturally increases the level of sugar (glucose) in your blood, so it is important for you to know the amount that is okay for you to have in every meal. Carbohydrate counting helps keep the level of glucose in your blood within normal limits. The amount of carbohydrates allowed is different for every person. A dietitian can help you calculate the amount that is right for you. Once you know the amount of carbohydrates you can have, you can count the carbohydrates in the foods you want to eat. Carbohydrates are found in the following foods:  Grains, such as breads and cereals.  Dried beans and soy products.  Starchy vegetables, such as potatoes, peas, and corn.  Fruit and fruit juices.  Milk and yogurt.  Sweets and snack foods, such as cake, cookies, candy, chips, soft drinks, and fruit drinks. CARBOHYDRATE COUNTING There are two ways to count the carbohydrates in your food. You can use either of the methods or a combination of both. Reading the "Nutrition Facts" on [place] The "Nutrition Facts" is an area that is included on the labels of almost all packaged food and beverages in the [place]. It includes the serving size of that food or beverage and information about the nutrients in each serving of the food, including the grams (g) of carbohydrate per serving.  Decide the number of servings of this food or beverage that you will be able to eat or drink. Multiply that number of servings by the number of grams of carbohydrate that is listed on the label for that serving. The total will be the amount of carbohydrates you will be having when you eat or drink this food or beverage. Learning Standard  Serving Sizes of Food When you eat food that is not packaged or does not include "Nutrition Facts" on the label, you need to measure the servings in order to count the amount of carbohydrates.A serving of most carbohydrate-rich foods contains about 15 g of carbohydrates. The following list includes serving sizes of carbohydrate-rich foods that provide 15 g ofcarbohydrate per serving:   1 slice of bread (1 oz) or 1 six-inch tortilla.    of a hamburger bun or English muffin.  4-6 crackers.   cup unsweetened dry cereal.    cup hot cereal.   cup rice or pasta.    cup mashed potatoes or  of a large baked potato.  1 cup fresh fruit or one small piece of fruit.    cup canned or frozen fruit or fruit juice.  1 cup milk.   cup plain fat-free yogurt or yogurt sweetened with artificial sweeteners.   cup cooked dried beans or starchy vegetable, such as peas, corn, or potatoes.  Decide the number of standard-size servings that you will eat. Multiply that number of servings by 15 (the grams of carbohydrates in that serving). For example, if you eat 2 cups of strawberries, you will have eaten 2 servings and 30 g of carbohydrates (2 servings x 15 g = 30 g). For foods such as soups and casseroles, in which more than one food is mixed in, you will need to count the carbohydrates in each food that is included. EXAMPLE OF CARBOHYDRATE COUNTING Sample Dinner  3 oz chicken breast.  cup of brown rice.   cup of corn.  1 cup milk.   1 cup strawberries with sugar-free whipped topping.  Carbohydrate Calculation Step 1: Identify the foods that contain carbohydrates:   Rice.   Corn.   Milk.   Strawberries. Step 2:Calculate the number of servings eaten of each:   2 servings of rice.   1 serving of corn.   1 serving of milk.   1 serving of strawberries. Step 3: Multiply each of those number of servings by 15 g:   2 servings of rice x 15 g = 30 g.   1  serving of corn x 15 g = 15 g.   1 serving of milk x 15 g = 15 g.   1 serving of strawberries x 15 g = 15 g. Step 4: Add together all of the amounts to find the total grams of carbohydrates eaten: 30 g + 15 g + 15 g + 15 g = 75 g. Document Released: 04/06/2005 Document Revised: 08/21/2013 Document Reviewed: 03/03/2013 [facility] Patient Information 2015 [place], [place]. This information is not intended to replace advice given to you by your health care provider. Make sure you discuss any questions you have with your health care provider. n

## 2014-05-08 NOTE — Progress Notes (Signed)
Pre visit review using our clinic review tool, if applicable. No additional management support is needed unless otherwise documented below in the visit note. 

## 2014-05-09 NOTE — Telephone Encounter (Signed)
Last normal lipids/hep panel 05/04/14.  Rx sent for 6 month supply.   eal

## 2014-05-10 ENCOUNTER — Encounter (HOSPITAL_COMMUNITY)

## 2014-05-11 ENCOUNTER — Encounter (HOSPITAL_COMMUNITY)

## 2014-05-13 NOTE — Assessment & Plan Note (Addendum)
Numbers trending up. hgba1c acceptable, minimize simple carbs. Increase exercise as tolerated. Continue current meds. Restart Metformin

## 2014-05-13 NOTE — Assessment & Plan Note (Signed)
Well controlled, no changes to meds. Encouraged heart healthy diet such as the DASH diet and exercise as tolerated.  °

## 2014-05-13 NOTE — Assessment & Plan Note (Signed)
Encouraged DASH diet, decrease po intake and increase exercise as tolerated. Needs 7-8 hours of sleep nightly. Avoid trans fats, eat small, frequent meals every 4-5 hours with lean proteins, complex carbs and healthy fats. Minimize simple carbs, GMO foods. 

## 2014-05-13 NOTE — Assessment & Plan Note (Signed)
Avoid offending foods, start probiotics. Do not eat large meals in late evening and consider raising head of bed.  

## 2014-05-13 NOTE — Progress Notes (Signed)
Patient ID: Brent [name], male   DOB: November 27, 1955, 59 y.o.   MRN: [id number]   EJ PINSON  [id number] [birth year] 05/13/2014      Progress Note-Follow Up  Subjective  Chief Complaint  Chief Complaint  Patient presents with  . Annual Exam    HPI  Patient is a 59 y.o. male in today for routine medical care. Patient in today for annual exam. Doing well. No recent illness. Denies polyuria or polydipsia. Is presently undergoing a cardiac work up for pre op clearance he is supposed to under go Right knee arthroscopy once cardiology has cleared him. Denies CP/palp/SOB/HA/congestion/fevers/GI or GU c/o. Taking meds as prescribed  Past Medical History  Diagnosis Date  . DM (diabetes mellitus), type 2, uncontrolled   . Acid reflux disease   . Sleep apnea     a. CPAP  . Hx of colonic polyps   . Diverticulosis of colon   . Hypertension 07/14/2010  . Morbid obesity 03/27/2010    a. 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  . 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 HYPERSOMNIA, ASSOCIATED WITH SLEEP APNEA 07/26/2008  . DIVERTICULOSIS, COLON 10/29/2008  . Diarrhea 06/13/2010  . COLONIC POLYPS, HX OF 10/29/2008  . Benign neoplasm of colon 07/05/2007  . ACID REFLUX DISEASE 07/05/2007  . Knee pain, left 10/10/2010  . Tear of meniscus of left knee 2012  . HTN (hypertension) 07/14/2010  . Breast pain, left 11/17/2011  . Preventative health care 11/17/2011  . Chest pain     a. Reportedly negative dobut echo performed prior to gastric bypass in 03/2011;  b. 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 12/2011 Cath: LM nl, LAD 50p, D1 29m, LCX min irregs, OM3 30, RCA 25p, 82m (FFR 0.99->0.89), PDA 30, EF 65%, Med Rx.  . Other and unspecified hyperlipidemia 11/16/2012  . Gout 03/04/2013  . GI bleed   . Atherosclerosis   . Anemia   . Hearing loss 05/24/2013    Previous audiology evaluation completely.  . Renal stone 11/2013    Past Surgical History  Procedure Laterality Date  . Ankle surgery  1994  . Tonsillectomy   age 40  . Colonoscopy polyps    . Knee arthroscopy  11/06/10    Left, torn meniscus (repaired)  . Colonoscopy    . Gastric bypass    . Hernia repair    . Upper gi endoscopy  03/27/13  . Esophagogastroduodenoscopy N/A 03/27/2013    Procedure: ESOPHAGOGASTRODUODENOSCOPY (EGD);  Surgeon: Irene Shipper, MD;  Location: [facility];  Service: Endoscopy;  Laterality: N/A;  . Cardiac catheterization      denies any chest pain in the past 2 years  . Left heart catheterization with coronary angiogram N/A 12/23/2011    Procedure: LEFT HEART CATHETERIZATION WITH CORONARY ANGIOGRAM;  Surgeon: Minus Breeding, MD;  Location: [facility] CATH LAB;  Service: Cardiovascular;  Laterality: N/A;    Family History  Problem Relation Age of Onset  . Diabetes Mother   . Hypertension Mother   . Stroke Mother   . Hyperlipidemia Mother   . Hypertension Father   . Colon polyps Father   . Heart attack Father 10  . Stroke Father   . Heart attack Brother   . Diabetes Brother   . Heart disease Brother   . Heart attack Brother     Multiple  . Diabetes Brother   . Other Brother     heart problems  . Heart disease Brother   . Diabetes Sister   .  Obesity Brother   . Diabetes Brother   . Hypertension Maternal Grandmother   . ADD / ADHD Daughter   . Stomach cancer Neg Hx     History   Social History  . Marital Status: Married    Spouse Name: N/A    Number of Children: 2  . Years of Education: N/A   Occupational History  . TEACHER    Social History Main Topics  . Smoking status: Former Smoker -- 1.50 packs/day for 20 years    Types: Cigarettes    Quit date: 04/21/1991  . Smokeless tobacco: Never Used  . Alcohol Use: 0.0 oz/week    0 drink(s) per week     Comment:  occasionaly- social  . Drug Use: No  . Sexual Activity: Yes   Other Topics Concern  . Not on file   Social History Narrative   Lives with wife in [place].  Does not routinely exercise.    Current Outpatient Prescriptions on File Prior  to Visit  Medication Sig Dispense Refill  . B Complex Vitamins (VITAMIN-B COMPLEX) TABS Place 1 tablet under the tongue daily.    . Cholecalciferol (VITAMIN D-3) 5000 UNITS TABS Take 1 tablet by mouth daily.    . Cyanocobalamin (VITAMIN B-12) 1000 MCG SUBL Place 1 each under the tongue daily.    . Ferrous Sulfate (RA IRON) 27 MG TABS Take 1 tablet by mouth 3 (three) times a week.     Javier Docker Oil 1000 MG CAPS Take 2 tablets by mouth daily.    . metoprolol tartrate (LOPRESSOR) 25 MG tablet TAKE ONE-HALF (1/2) TABLET (12.5 MG) TWICE A DAY 90 tablet 0  . Multiple Vitamin (MULTIVITAMIN) tablet Take 2 tablets by mouth daily. BARIATRIC VIT    . pantoprazole (PROTONIX) 40 MG tablet TAKE 1 TABLET TWICE A DAY 180 tablet 2  . simvastatin (ZOCOR) 10 MG tablet TAKE 1 TABLET AT BEDTIME 90 tablet 0  . [DISCONTINUED] ramipril (ALTACE) 10 MG tablet Take 1 tablet (10 mg total) by mouth daily. 2 tabs po daily 180 tablet 1   No current facility-administered medications on file prior to visit.    Allergies  Allergen Reactions  . Bee Venom Anaphylaxis  . Lipitor [Atorvastatin]     Myalgias, memory changes  . Morphine     hyperactive    Review of Systems  Review of Systems  Constitutional: Negative for fever and malaise/fatigue.  HENT: Negative for congestion.   Eyes: Negative for discharge.  Respiratory: Negative for shortness of breath.   Cardiovascular: Negative for chest pain, palpitations and leg swelling.  Gastrointestinal: Negative for nausea, abdominal pain and diarrhea.  Genitourinary: Negative for dysuria.  Musculoskeletal: Negative for myalgias, falls and neck pain.  Skin: Negative for rash.  Neurological: Negative for loss of consciousness and headaches.  Endo/Heme/Allergies: Negative for polydipsia.  Psychiatric/Behavioral: Negative for depression and suicidal ideas. The patient is not nervous/anxious and does not have insomnia.     Objective  BP 121/71 mmHg  Pulse 80  Temp(Src)  98.4 F (36.9 C) (Oral)  Ht 5\' 7"  (1.702 m)  Wt 260 lb 6.4 oz (118.117 kg)  BMI 40.77 kg/m2  SpO2 97%  Physical Exam  Physical Exam  Constitutional: He is oriented to person, place, and time and well-developed, well-nourished, and in no distress. No distress.  HENT:  Head: Normocephalic and atraumatic.  Eyes: Conjunctivae are normal.  Neck: Neck supple. No thyromegaly present.  Cardiovascular: Normal rate, regular rhythm and normal heart sounds.  No murmur heard. Pulmonary/Chest: Effort normal and breath sounds normal. No respiratory distress.  Abdominal: Soft. Bowel sounds are normal. He exhibits no distension and no mass. There is no tenderness.  Musculoskeletal: He exhibits no edema.  Neurological: He is alert and oriented to person, place, and time.  Skin: Skin is warm and dry. No erythema.  Psychiatric: Memory, affect and judgment normal.    Lab Results  Component Value Date   TSH 1.43 05/04/2014   Lab Results  Component Value Date   WBC 5.5 05/04/2014   HGB 12.1* 05/04/2014   HCT 37.4* 05/04/2014   MCV 83.2 05/04/2014   PLT 255.0 05/04/2014   Lab Results  Component Value Date   CREATININE 0.92 05/04/2014   BUN 18 05/04/2014   NA 138 05/04/2014   K 4.4 05/04/2014   CL 105 05/04/2014   CO2 27 05/04/2014   Lab Results  Component Value Date   ALT 19 05/04/2014   AST 21 05/04/2014   ALKPHOS 41 05/04/2014   BILITOT 0.5 05/04/2014   Lab Results  Component Value Date   CHOL 147 05/04/2014   Lab Results  Component Value Date   HDL 43.30 05/04/2014   Lab Results  Component Value Date   LDLCALC 76 05/04/2014   Lab Results  Component Value Date   TRIG 141.0 05/04/2014   Lab Results  Component Value Date   CHOLHDL 3 05/04/2014     Assessment & Plan  HTN (hypertension) Well controlled, no changes to meds. Encouraged heart healthy diet such as the DASH diet and exercise as tolerated.    CAD (coronary artery disease) Asymptomatic doing well,  follows with cardiology   Acid reflux Avoid offending foods, start probiotics. Do not eat large meals in late evening and consider raising head of bed.    Diabetes type 2, controlled Numbers trending up. hgba1c acceptable, minimize simple carbs. Increase exercise as tolerated. Continue current meds. Restart Metformin   Morbid obesity Encouraged DASH diet, decrease po intake and increase exercise as tolerated. Needs 7-8 hours of sleep nightly. Avoid trans fats, eat small, frequent meals every 4-5 hours with lean proteins, complex carbs and healthy fats. Minimize simple carbs, GMO foods.   Preventative health care Patient encouraged to maintain heart healthy diet, regular exercise, adequate sleep. Consider daily probiotics. Take medications as prescribed. Labs reviewed today

## 2014-05-13 NOTE — Assessment & Plan Note (Signed)
Patient encouraged to maintain heart healthy diet, regular exercise, adequate sleep. Consider daily probiotics. Take medications as prescribed. Labs reviewed today

## 2014-05-13 NOTE — Assessment & Plan Note (Signed)
Asymptomatic doing well, follows with cardiology

## 2014-05-21 ENCOUNTER — Encounter: Payer: Self-pay | Admitting: Cardiology

## 2014-05-25 ENCOUNTER — Other Ambulatory Visit: Payer: Self-pay | Admitting: Family Medicine

## 2014-05-25 ENCOUNTER — Ambulatory Visit (HOSPITAL_COMMUNITY): Attending: Cardiology | Admitting: Radiology

## 2014-05-25 DIAGNOSIS — I10 Essential (primary) hypertension: Secondary | ICD-10-CM | POA: Diagnosis not present

## 2014-05-25 DIAGNOSIS — Z01818 Encounter for other preprocedural examination: Secondary | ICD-10-CM | POA: Diagnosis present

## 2014-05-25 DIAGNOSIS — E119 Type 2 diabetes mellitus without complications: Secondary | ICD-10-CM | POA: Insufficient documentation

## 2014-05-25 DIAGNOSIS — R9431 Abnormal electrocardiogram [ECG] [EKG]: Secondary | ICD-10-CM | POA: Diagnosis not present

## 2014-05-25 MED ORDER — TECHNETIUM TC 99M SESTAMIBI GENERIC - CARDIOLITE
10.0000 | Freq: Once | INTRAVENOUS | Status: AC | PRN
  Administered 2014-05-25: 10 via INTRAVENOUS

## 2014-05-25 MED ORDER — REGADENOSON 0.4 MG/5ML IV SOLN
0.4000 mg | Freq: Once | INTRAVENOUS | Status: AC
  Administered 2014-05-25: 0.4 mg via INTRAVENOUS

## 2014-05-25 MED ORDER — TECHNETIUM TC 99M SESTAMIBI GENERIC - CARDIOLITE
30.0000 | Freq: Once | INTRAVENOUS | Status: AC | PRN
  Administered 2014-05-25: 30 via INTRAVENOUS

## 2014-05-25 NOTE — Progress Notes (Signed)
[facility] [address] [place], [place] [address] [phone]    Cardiology Nuclear Med Study  Brent [name] is a 59 y.o. male     MRN : [id number]     DOB: 1956-04-17  Procedure Date: 05/25/2014  Nuclear Med Background Indication for Stress Test:  Evaluation for Ischemia, Surgical Clearance- Knee surgery(Dr Hart Robinsons) and Abnormal EKG History:  CAD, MPI 2003 (normal) EF 58% Cardiac Risk Factors: Hypertension and NIDDM  Symptoms:  None indicated   Nuclear Pre-Procedure Caffeine/Decaff Intake:  None NPO After: 9:00pm   Lungs:  clear O2 Sat: 98% on room air. IV 0.9% NS with Angio Cath:  22g  IV Site: R Hand  IV Started by:  Crissie Figures, RN  Chest Size (in):  50 Cup Size: n/a  Height: 5\' 8"  (1.727 m)  Weight:  260 lb (117.935 kg)  BMI:  Body mass index is 39.54 kg/(m^2). Tech Comments:  N/A    Nuclear Med Study 1 or 2 day study: 1 day  Stress Test Type:  Lexiscan  Reading MD: N/A  Order Authorizing Provider:  Willette Alma, MD  Resting Radionuclide: Technetium 61m Sestamibi  Resting Radionuclide Dose: 11.0 mCi   Stress Radionuclide:  Technetium 82m Sestamibi  Stress Radionuclide Dose: 33.0 mCi           Stress Protocol Rest HR: 60 Stress HR: 102  Rest BP: 132/68 Stress BP: 140/76  Exercise Time (min): n/a METS: n/a   Predicted Max HR: 162 bpm % Max HR: 62.96 bpm     Dose of Adenosine (mg):  n/a Dose of Lexiscan: 0.4 mg  Dose of Atropine (mg): n/a Dose of Dobutamine: n/a mcg/kg/min (at max HR)  Stress Test Technologist: Glade Lloyd, BS-ES  Nuclear Technologist:  Earl Many, CNMT     Rest Procedure:  Myocardial perfusion imaging was performed at rest 45 minutes following the intravenous administration of Technetium 81m Sestamibi. Rest ECG: NSR, septal MI, nonspecific ST changes.  Stress Procedure:  The patient received IV Lexiscan 0.4 mg over 15-seconds.  Technetium 54m Sestamibi injected at 30-seconds.  Quantitative spect  images were obtained after a 45 minute delay.  During the infusion of Lexiscan the patient complained of SOB, cough and queasy stomach.  This resolved in recovery.  Stress ECG: No significant ST segment change suggestive of ischemia.  QPS Raw Data Images:  Acquisition technically good; normal left ventricular size. Stress Images:  There is decreased uptake in the apex. Rest Images:  There is decreased uptake in the apex, slightly less prominent compared to the stress images. Subtraction (SDS):  These findings are consistent with small prior infarct with minimal peri-infarct ischemia vs prominent apical thinning. Transient Ischemic Dilatation (Normal <1.22):  1.01 Lung/Heart Ratio (Normal <0.45):  0.34  Quantitative Gated Spect Images QGS EDV:  131 ml QGS ESV:  59 ml  Impression Exercise Capacity:  Lexiscan with no exercise. BP Response:  Normal blood pressure response. Clinical Symptoms:  There is dyspnea. ECG Impression:  No significant ST segment change suggestive of ischemia. Comparison with Prior Nuclear Study: No previous nuclear study performed  Overall Impression:  Low risk stress nuclear study with a small, severe intensity, partially reversible apical defect consistent with small prior apical infarct with minimal peri-infarct ischemia vs prominent apical thinning.  LV Ejection Fraction: 55%.  LV Wall Motion:  NL LV Function; NL Wall Motion  Kirk Ruths

## 2014-05-31 ENCOUNTER — Telehealth: Payer: Self-pay | Admitting: Physician Assistant

## 2014-05-31 NOTE — Telephone Encounter (Signed)
Follow up ° ° ° ° °Returning Brent [name]'s call °

## 2014-05-31 NOTE — Telephone Encounter (Signed)
Pt notified Dr Aundra Dubin has cleared him  knee surgery by Dr Theda Sers.

## 2014-05-31 NOTE — Telephone Encounter (Signed)
Request for surgical clearance:  What type of surgery is being performed? Knee surgery  1. When is this surgery scheduled? Pending  2. Are there any medications that need to be held prior to surgery and how long? No Medication, just need to be cleared   3. Name of physician performing surgery? [facility]  4. What is your office phone and fax number?[phone] fax and phone [phone] 5.    Pt want to know status of his clearance for him to have knee surgery. Per message from Dr Aundra Dubin he couldn't give clearance and stated pt need to come in and see PA, which he saw [facility]..Please advise

## 2014-05-31 NOTE — Telephone Encounter (Signed)
Dr Aundra Dubin completed surgical clearance request for right knee surgery  from Dr Hart Robinsons.  Pt is cleared for surgery--low risk stress test.  LMTCB for pt.

## 2014-06-01 ENCOUNTER — Telehealth: Payer: Self-pay | Admitting: Cardiology

## 2014-06-01 NOTE — Telephone Encounter (Signed)
Received request from Nurse fax box, documents faxed for surgical clearance. To: [employer] Fax number: [phone] Attention: 2.12.16/km

## 2014-06-18 ENCOUNTER — Encounter (HOSPITAL_COMMUNITY)

## 2014-06-25 ENCOUNTER — Telehealth: Payer: Self-pay | Admitting: Family Medicine

## 2014-06-25 ENCOUNTER — Other Ambulatory Visit: Payer: Self-pay | Admitting: Family Medicine

## 2014-06-25 MED ORDER — GLUCOSE BLOOD VI STRP: ORAL_STRIP | Status: DC

## 2014-06-25 NOTE — Telephone Encounter (Signed)
Caller name: Khadeem Relation to pt: self Call back number: [phone] [facility]  Reason for call:   Requesting three month supply with refills of freestyle lite test strips

## 2014-06-25 NOTE — Telephone Encounter (Signed)
Strips sent as requested to [facility].

## 2014-07-05 HISTORY — PX: OTHER SURGICAL HISTORY: SHX169

## 2014-07-16 LAB — HM DIABETES EYE EXAM

## 2014-09-03 ENCOUNTER — Other Ambulatory Visit: Payer: Self-pay | Admitting: Family Medicine

## 2014-09-05 ENCOUNTER — Telehealth: Payer: Self-pay | Admitting: Family Medicine

## 2014-09-05 NOTE — Telephone Encounter (Signed)
Spoke with patient who states that the Tramadol was prescribed by Dr Theda Sers who performed the surgery. Advised to contact their office to have them change to oral if they determine that would be best.  Patient agrees with plan.

## 2014-09-05 NOTE — Telephone Encounter (Signed)
Caller name:Raekwan Relationship to patient:SELF  Can be [place]   Reason for call:HE IS TAKING TRAMADOL.  HE HAD GASTRIC BYPASS.  THE TRAMADOL IS NOT WORKING FOR A LONG TIME.  THEN WHEN IT WORKS  IT HITS REALLY HARD .  HE WOULD LIKE TO KNOW IF HE COULD GET THIS IN A LIQUID SO THAT THE BYPASS WOULD ALLOW TO WORK IMMEDIATELY

## 2014-10-16 ENCOUNTER — Other Ambulatory Visit: Payer: Self-pay | Admitting: Family Medicine

## 2014-11-03 ENCOUNTER — Other Ambulatory Visit: Payer: Self-pay | Admitting: Family Medicine

## 2014-11-15 ENCOUNTER — Encounter: Payer: Self-pay | Admitting: Family Medicine

## 2014-11-15 ENCOUNTER — Ambulatory Visit (INDEPENDENT_AMBULATORY_CARE_PROVIDER_SITE_OTHER): Admitting: Family Medicine

## 2014-11-15 DIAGNOSIS — Z0000 Encounter for general adult medical examination without abnormal findings: Secondary | ICD-10-CM

## 2014-11-15 DIAGNOSIS — E559 Vitamin D deficiency, unspecified: Secondary | ICD-10-CM | POA: Diagnosis not present

## 2014-11-15 DIAGNOSIS — E669 Obesity, unspecified: Secondary | ICD-10-CM

## 2014-11-15 DIAGNOSIS — E119 Type 2 diabetes mellitus without complications: Secondary | ICD-10-CM | POA: Diagnosis not present

## 2014-11-15 DIAGNOSIS — Z9884 Bariatric surgery status: Secondary | ICD-10-CM

## 2014-11-15 DIAGNOSIS — I10 Essential (primary) hypertension: Secondary | ICD-10-CM | POA: Diagnosis not present

## 2014-11-15 DIAGNOSIS — E1169 Type 2 diabetes mellitus with other specified complication: Secondary | ICD-10-CM

## 2014-11-15 DIAGNOSIS — G4733 Obstructive sleep apnea (adult) (pediatric): Secondary | ICD-10-CM

## 2014-11-15 DIAGNOSIS — N200 Calculus of kidney: Secondary | ICD-10-CM

## 2014-11-15 LAB — COMPREHENSIVE METABOLIC PANEL
ALT: 19 U/L (ref 0–53)
AST: 24 U/L (ref 0–37)
Albumin: 4.4 g/dL (ref 3.5–5.2)
Alkaline Phosphatase: 44 U/L (ref 39–117)
BUN: 17 mg/dL (ref 6–23)
CO2: 28 mEq/L (ref 19–32)
Calcium: 9.8 mg/dL (ref 8.4–10.5)
Chloride: 103 mEq/L (ref 96–112)
Creatinine, Ser: 1 mg/dL (ref 0.40–1.50)
GFR: 81.26 mL/min (ref >60.00)
Glucose, Bld: 156 mg/dL — ABNORMAL HIGH (ref 70–99)
Potassium: 4.5 mEq/L (ref 3.5–5.1)
Sodium: 139 mEq/L (ref 135–145)
Total Bilirubin: 0.7 mg/dL (ref 0.2–1.2)
Total Protein: 6.7 g/dL (ref 6.0–8.3)

## 2014-11-15 LAB — HEMOGLOBIN A1C: Hgb A1c MFr Bld: 6.5 % (ref 4.6–6.5)

## 2014-11-15 LAB — VITAMIN D 25 HYDROXY (VIT D DEFICIENCY, FRACTURES): VITD: 36.27 ng/mL (ref 30.00–100.00)

## 2014-11-15 LAB — CBC
HCT: 36.9 % — ABNORMAL LOW (ref 39.0–52.0)
Hemoglobin: 12.3 g/dL — ABNORMAL LOW (ref 13.0–17.0)
MCHC: 33.3 g/dL (ref 30.0–36.0)
MCV: 81.3 fl (ref 78.0–100.0)
Platelets: 252 10*3/uL (ref 150.0–400.0)
RBC: 4.54 Mil/uL (ref 4.22–5.81)
RDW: 15.3 % (ref 11.5–15.5)
WBC: 5.1 10*3/uL (ref 4.0–10.5)

## 2014-11-15 LAB — LIPID PANEL
Cholesterol: 152 mg/dL (ref 0–200)
HDL: 44.9 mg/dL (ref >39.00)
LDL Cholesterol: 75 mg/dL (ref 0–99)
NonHDL: 106.85
Total CHOL/HDL Ratio: 3
Triglycerides: 157 mg/dL — ABNORMAL HIGH (ref 0.0–149.0)
VLDL: 31.4 mg/dL (ref 0.0–40.0)

## 2014-11-15 LAB — PSA: PSA: 0.84 ng/mL (ref 0.10–4.00)

## 2014-11-15 LAB — TSH: TSH: 3.85 u[IU]/mL (ref 0.35–4.50)

## 2014-11-15 LAB — HM DIABETES FOOT EXAM

## 2014-11-15 MED ORDER — METFORMIN HCL 500 MG PO TABS: 500.0000 mg | ORAL_TABLET | Freq: Four times a day (QID) | ORAL | Status: DC

## 2014-11-15 NOTE — Progress Notes (Signed)
Pre visit review using our clinic review tool, if applicable. No additional management support is needed unless otherwise documented below in the visit note. 

## 2014-11-15 NOTE — Assessment & Plan Note (Signed)
Check level today 

## 2014-11-15 NOTE — Assessment & Plan Note (Signed)
Uses CPAP well. No concerns

## 2014-11-15 NOTE — Assessment & Plan Note (Signed)
Well controlled, no changes to meds. Encouraged heart healthy diet such as the DASH diet and exercise as tolerated.  °

## 2014-11-15 NOTE — Progress Notes (Signed)
Brent [name]  [id number] Nov 15, 1955 11/15/2014      Progress Note-Follow Up  Subjective  Chief Complaint  Chief Complaint  Patient presents with  . Follow-up    HPI  Patient is a 59 y.o. male in today for routine medical care. Doing well. No recent illness, denies any new concerns. Has been eating better with smaller portions and less carbs again. Is once again loosing weight. Walking. Denies CP/palp/SOB/HA/congestion/fevers/GI or GU c/o. Taking meds as prescribed  Past Medical History  Diagnosis Date  . DM (diabetes mellitus), type 2, uncontrolled   . Acid reflux disease   . Sleep apnea     a. CPAP  . Hx of colonic polyps   . Diverticulosis of colon   . Hypertension 07/14/2010  . Morbid obesity 03/27/2010    a. 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  . 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 HYPERSOMNIA, ASSOCIATED WITH SLEEP APNEA 07/26/2008  . DIVERTICULOSIS, COLON 10/29/2008  . Diarrhea 06/13/2010  . COLONIC POLYPS, HX OF 10/29/2008  . Benign neoplasm of colon 07/05/2007  . ACID REFLUX DISEASE 07/05/2007  . Knee pain, left 10/10/2010  . Tear of meniscus of left knee 2012  . HTN (hypertension) 07/14/2010  . Breast pain, left 11/17/2011  . Preventative health care 11/17/2011  . Chest pain     a. Reportedly negative dobut echo performed prior to gastric bypass in 03/2011;  b. 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 12/2011 Cath: LM nl, LAD 50p, D1 57m, LCX min irregs, OM3 30, RCA 25p, 23m (FFR 0.99->0.89), PDA 30, EF 65%, Med Rx.  . Other and unspecified hyperlipidemia 11/16/2012  . Gout 03/04/2013  . GI bleed   . Atherosclerosis   . Anemia   . Hearing loss 05/24/2013    Previous audiology evaluation completely.  . Renal stone 11/2013    Past Surgical History  Procedure Laterality Date  . Ankle surgery  1994  . Tonsillectomy  age 89  . Colonoscopy polyps    . Knee arthroscopy  11/06/10    Left, torn meniscus (repaired)  . Colonoscopy    . Gastric bypass    . Hernia repair    .  Upper gi endoscopy  03/27/13  . Esophagogastroduodenoscopy N/A 03/27/2013    Procedure: ESOPHAGOGASTRODUODENOSCOPY (EGD);  Surgeon: Irene Shipper, MD;  Location: [facility];  Service: Endoscopy;  Laterality: N/A;  . Cardiac catheterization      denies any chest pain in the past 2 years  . Left heart catheterization with coronary angiogram N/A 12/23/2011    Procedure: LEFT HEART CATHETERIZATION WITH CORONARY ANGIOGRAM;  Surgeon: Minus Breeding, MD;  Location: [facility] CATH LAB;  Service: Cardiovascular;  Laterality: N/A;  . Right knee arthroscopy Right 07/05/14    Dr. Hart Robinsons, [place].    Family History  Problem Relation Age of Onset  . Diabetes Mother   . Hypertension Mother   . Stroke Mother   . Hyperlipidemia Mother   . Hypertension Father   . Colon polyps Father   . Heart attack Father 25  . Stroke Father   . Heart attack Brother   . Diabetes Brother   . Heart disease Brother   . Heart attack Brother     Multiple  . Diabetes Brother   . Other Brother     heart problems  . Heart disease Brother   . Diabetes Sister   . Obesity Brother   . Diabetes Brother   . Hypertension Maternal Grandmother   .  ADD / ADHD Daughter   . Stomach cancer Neg Hx     History   Social History  . Marital Status: Married    Spouse Name: N/A  . Number of Children: 2  . Years of Education: N/A   Occupational History  . TEACHER    Social History Main Topics  . Smoking status: Former Smoker -- 1.50 packs/day for 20 years    Types: Cigarettes    Quit date: 04/21/1991  . Smokeless tobacco: Never Used  . Alcohol Use: 0.0 oz/week    0 drink(s) per week     Comment:  occasionaly- social  . Drug Use: No  . Sexual Activity: Yes   Other Topics Concern  . Not on file   Social History Narrative   Lives with wife in [place].  Does not routinely exercise.    Current Outpatient Prescriptions on File Prior to Visit  Medication Sig Dispense Refill  . B Complex Vitamins (VITAMIN-B COMPLEX)  TABS Place 1 tablet under the tongue daily.    . Cholecalciferol (VITAMIN D-3) 5000 UNITS TABS Take 1 tablet by mouth daily.    . Cyanocobalamin (VITAMIN B-12) 1000 MCG SUBL Place 1 each under the tongue daily.    . fenofibrate 160 MG tablet TAKE 1 TABLET DAILY 90 tablet 0  . Ferrous Sulfate (RA IRON) 27 MG TABS Take 1 tablet by mouth 3 (three) times a week.     [name] glucose blood test strip Use as directed once daily to check blood sugar.  Diagnosis code E11.9 100 each 3  . Krill Oil 1000 MG CAPS Take 2 tablets by mouth daily.    . metoprolol tartrate (LOPRESSOR) 25 MG tablet TAKE ONE-HALF (1/2) TABLET TWICE A DAY 90 tablet 0  . Multiple Vitamin (MULTIVITAMIN) tablet Take 2 tablets by mouth daily. BARIATRIC VIT    . pantoprazole (PROTONIX) 40 MG tablet TAKE 1 TABLET TWICE A DAY 180 tablet 2  . simvastatin (ZOCOR) 10 MG tablet TAKE 1 TABLET AT BEDTIME 90 tablet 3  . [DISCONTINUED] ramipril (ALTACE) 10 MG tablet Take 1 tablet (10 mg total) by mouth daily. 2 tabs po daily 180 tablet 1   No current facility-administered medications on file prior to visit.    Allergies  Allergen Reactions  . Bee Venom Anaphylaxis  . Lipitor [Atorvastatin]     Myalgias, memory changes  . Morphine     hyperactive    Review of Systems  Review of Systems  Constitutional: Negative for fever and malaise/fatigue.  HENT: Negative for congestion.   Eyes: Negative for discharge.  Respiratory: Negative for shortness of breath.   Cardiovascular: Negative for chest pain, palpitations and leg swelling.  Gastrointestinal: Negative for nausea, abdominal pain and diarrhea.  Genitourinary: Negative for dysuria.  Musculoskeletal: Negative for falls.  Skin: Negative for rash.  Neurological: Negative for loss of consciousness and headaches.  Endo/Heme/Allergies: Negative for polydipsia.  Psychiatric/Behavioral: Negative for depression and suicidal ideas. The patient is not nervous/anxious and does not have insomnia.      Objective  BP 110/76 mmHg  Pulse 73  Temp(Src) 98.5 F (36.9 C) (Oral)  Ht 5\' 8"  (1.727 m)  Wt 249 lb 8 oz (113.172 kg)  BMI 37.94 kg/m2  SpO2 97%  Physical Exam  Physical Exam  Constitutional: He is oriented to person, place, and time and well-developed, well-nourished, and in no distress. No distress.  HENT:  Head: Normocephalic and atraumatic.  Eyes: Conjunctivae are normal.  Neck: Neck supple. No thyromegaly  present.  Cardiovascular: Normal rate, regular rhythm and normal heart sounds.   No murmur heard. Pulmonary/Chest: Effort normal and breath sounds normal. No respiratory distress.  Abdominal: He exhibits no distension and no mass. There is no tenderness.  Musculoskeletal: He exhibits no edema.  Neurological: He is alert and oriented to person, place, and time.  Skin: Skin is warm.  Psychiatric: Memory, affect and judgment normal.    Lab Results  Component Value Date   TSH 1.43 05/04/2014   Lab Results  Component Value Date   WBC 5.5 05/04/2014   HGB 12.1* 05/04/2014   HCT 37.4* 05/04/2014   MCV 83.2 05/04/2014   PLT 255.0 05/04/2014   Lab Results  Component Value Date   CREATININE 0.92 05/04/2014   BUN 18 05/04/2014   NA 138 05/04/2014   K 4.4 05/04/2014   CL 105 05/04/2014   CO2 27 05/04/2014   Lab Results  Component Value Date   ALT 19 05/04/2014   AST 21 05/04/2014   ALKPHOS 41 05/04/2014   BILITOT 0.5 05/04/2014   Lab Results  Component Value Date   CHOL 147 05/04/2014   Lab Results  Component Value Date   HDL 43.30 05/04/2014   Lab Results  Component Value Date   LDLCALC 76 05/04/2014   Lab Results  Component Value Date   TRIG 141.0 05/04/2014   Lab Results  Component Value Date   CHOLHDL 3 05/04/2014     Assessment & Plan  Morbid obesity Encouraged DASH diet, decrease po intake and increase exercise as tolerated. Needs 7-8 hours of sleep nightly. Avoid trans fats, eat small, frequent meals every 4-5 hours with lean  proteins, complex carbs and healthy fats. Minimize simple carbs,  HTN (hypertension) Well controlled, no changes to meds. Encouraged heart healthy diet such as the DASH diet and exercise as tolerated.   Diabetes type 2, controlled hgba1c acceptable, minimize simple carbs. Increase exercise as tolerated. Continue current meds. Low of 70 and hi 180, average 120 to 130  Kidney stone Recently saw Dr Risa Grill no stones present  Avitaminosis D Check level today  H/O gastric bypass Doing well, no recent bleeding or dyspepsia  OSA (obstructive sleep apnea) Uses CPAP well. No concerns

## 2014-11-15 NOTE — Assessment & Plan Note (Signed)
hgba1c acceptable, minimize simple carbs. Increase exercise as tolerated. Continue current meds. Low of 70 and hi 180, average 120 to 130

## 2014-11-15 NOTE — Assessment & Plan Note (Signed)
Recently saw Dr Risa Grill no stones present

## 2014-11-15 NOTE — Assessment & Plan Note (Signed)
Encouraged DASH diet, decrease po intake and increase exercise as tolerated. Needs 7-8 hours of sleep nightly. Avoid trans fats, eat small, frequent meals every 4-5 hours with lean proteins, complex carbs and healthy fats. Minimize simple carbs, 

## 2014-11-15 NOTE — Patient Instructions (Signed)

## 2014-11-15 NOTE — Assessment & Plan Note (Signed)
Doing well, no recent bleeding or dyspepsia

## 2014-11-16 ENCOUNTER — Telehealth: Payer: Self-pay

## 2014-11-16 NOTE — Telephone Encounter (Signed)
-----   Message from [name], MD sent at 11/15/2014 10:35 PM EDT ----- Labs look good, no new concerns. Mild anemia noted. Increase leafy greens, consider increased lean red meat and using cast iron cookware. Continue to monitor, report any concerns. Triglycerides up very slightly, minimize the simple carbs. Sugar looks much better, keep up the good work. Have fun this weekend. Dr Jacinto Reap

## 2014-11-16 NOTE — Telephone Encounter (Signed)
Notified through MyChart.

## 2014-11-27 ENCOUNTER — Other Ambulatory Visit: Payer: Self-pay | Admitting: Family Medicine

## 2015-01-07 ENCOUNTER — Encounter: Payer: Self-pay | Admitting: Family Medicine

## 2015-01-12 ENCOUNTER — Other Ambulatory Visit: Payer: Self-pay | Admitting: Physician Assistant

## 2015-01-12 NOTE — Telephone Encounter (Signed)
Will defer further refills to PCP. 

## 2015-02-01 ENCOUNTER — Other Ambulatory Visit: Payer: Self-pay | Admitting: Family Medicine

## 2015-02-21 ENCOUNTER — Encounter: Payer: Self-pay | Admitting: *Deleted

## 2015-02-21 ENCOUNTER — Encounter: Payer: Self-pay | Admitting: Cardiology

## 2015-02-21 ENCOUNTER — Ambulatory Visit (INDEPENDENT_AMBULATORY_CARE_PROVIDER_SITE_OTHER): Admitting: Cardiology

## 2015-02-21 VITALS — BP 130/86 | HR 80 | Ht 68.5 in | Wt 255.1 lb

## 2015-02-21 DIAGNOSIS — I10 Essential (primary) hypertension: Secondary | ICD-10-CM

## 2015-02-21 DIAGNOSIS — E785 Hyperlipidemia, unspecified: Secondary | ICD-10-CM | POA: Diagnosis not present

## 2015-02-21 DIAGNOSIS — I2510 Atherosclerotic heart disease of native coronary artery without angina pectoris: Secondary | ICD-10-CM

## 2015-02-21 MED ORDER — ROSUVASTATIN CALCIUM 5 MG PO TABS
ORAL_TABLET | ORAL | Status: DC
Start: 2015-02-21 — End: 2015-02-21

## 2015-02-21 MED ORDER — ROSUVASTATIN CALCIUM 5 MG PO TABS: ORAL_TABLET | ORAL | Status: DC

## 2015-02-21 NOTE — Patient Instructions (Addendum)
Medication Instructions:  Start rosuvastatin (crestor) 5mg  every other day.  Labwork: Your physician recommends that you return for a FASTING lipid profile /liver profile in 2 months.   Testing/Procedures: None today  Follow-Up: Your physician wants you to follow-up in: 1 year with Dr Aundra Dubin. (November 2017).You will receive a reminder letter in the mail two months in advance. If you don't receive a letter, please call our office to schedule the follow-up appointment.        If you need a refill on your cardiac medications before your next appointment, please call your pharmacy.  Prior authorization for crestor 5mg  every other day faxed to [facility] [phone] (see letter dated 02/21/15). Pt's benefits ID # [id number]

## 2015-02-23 DIAGNOSIS — E785 Hyperlipidemia, unspecified: Secondary | ICD-10-CM | POA: Insufficient documentation

## 2015-02-23 NOTE — Progress Notes (Signed)
Patient ID: Brent [name], male   DOB: [birth year], 59 y.o.   MRN: [id number] PCP: Dr. Charlett Blake  60 yo with history of CAD, HTN, and diabetes presents for followup.  He had cardiac cath with 70% RCA stenosis in 9/13 that was managed medically.  He had a Cardiolite in 2/16 that was low risk. Currently doing ok, no chest pain.  He does not have exertional dyspnea.  No lightheadedness or palpitations.  He is not currently on a statin due to myalgias with atorvastatin and simvastatin.  Main limitation is bilateral knee pain.   ECG; NSR, normal  Labs (7/16): LDL 75, HDl 45, K 4.5, creatinine 1.0  PMH: 1. Hyperlipidemia: Myalgias with atorvastatin and simvastatin.  2. Type II diabetes 3. HTN 4. Obesity with history of gastric bypass in 12/12.  5. OA knees 6. CAD: LHC (9/13) with 70% mRCA stenosis, managed medically.  Lexiscan Cardiolite (2/16) with EF 55%, small prior apical infarct with peri-infarct ischemia => low risk study. .    SH: Lives in [place], high school teacher, prior smoker.    FH: Father with MI, CVA, CABG.  Brother with MI at 7, another brother had MI at 92.   ROS: All systems reviewed and negative except as per HPI.   Current Outpatient Prescriptions  Medication Sig Dispense Refill  . B Complex Vitamins (VITAMIN-B COMPLEX) TABS Place 1 tablet under the tongue daily.    . Cholecalciferol (VITAMIN D-3) 5000 UNITS TABS Take 1 tablet by mouth daily.    . Cyanocobalamin (VITAMIN B-12) 1000 MCG SUBL Place 1 each under the tongue daily.    . fenofibrate 160 MG tablet TAKE 1 TABLET DAILY 90 tablet 0  . glucose blood test strip Use as directed once daily to check blood sugar.  Diagnosis code E11.9 100 each 3  . Krill Oil 1000 MG CAPS Take 2 tablets by mouth daily.    . metFORMIN (GLUCOPHAGE) 500 MG tablet Take 1 tablet (500 mg total) by mouth 4 (four) times daily. 360 tablet 2  . metoprolol tartrate (LOPRESSOR) 25 MG tablet TAKE ONE-HALF (1/2) TABLET TWICE A DAY 90 tablet 1  .  Multiple Vitamin (MULTIVITAMIN) tablet Take 2 tablets by mouth daily. BARIATRIC VIT    . pantoprazole (PROTONIX) 40 MG tablet TAKE 1 TABLET TWICE A DAY 180 tablet 2  . aspirin EC 81 MG tablet Take 1 tablet (81 mg total) by mouth daily.    . rosuvastatin (CRESTOR) 5 MG tablet 1 tablet by mouth every other day 45 tablet 3  . [DISCONTINUED] ramipril (ALTACE) 10 MG tablet Take 1 tablet (10 mg total) by mouth daily. 2 tabs po daily 180 tablet 1   No current facility-administered medications for this visit.   BP 130/86 mmHg  Pulse 80  Ht 5' 8.5" (1.74 m)  Wt 255 lb 1.9 oz (115.722 kg)  BMI 38.22 kg/m2 General: obese, NAD Neck: No JVD, no thyromegaly or thyroid nodule.  Lungs: Clear to auscultation bilaterally with normal respiratory effort. CV: Nondisplaced PMI.  Heart regular S1/S2, no S3/S4, no murmur.  No peripheral edema.  No carotid bruit.  Normal pedal pulses.  Abdomen: Soft, nontender, no hepatosplenomegaly, no distention.  Skin: Intact without lesions or rashes.  Neurologic: Alert and oriented x 3.  Psych: Normal affect. Extremities: No clubbing or cyanosis.  HEENT: Normal.   Assessment/Plan: 1. CAD: 70% mid RCA stenosis on cardiac cath in 9/13.  This was managed medically.  No chest pain.   - Continue ASA  81 and metoprolol - I will have him start Crestor 5 mg every other day.  2. HTN: BP is controlled . 3. Hyperlipidemia: Goal LDL < 70.  Myalgias with atorvastatin and simvastatin.  Start Crestor 5 mg every other day, lipids/LFTs in 2 months.   Loralie Champagne 02/23/2015

## 2015-02-26 ENCOUNTER — Telehealth: Payer: Self-pay

## 2015-02-26 NOTE — Telephone Encounter (Signed)
Prior auth for Crestor 5 mg QOD sent to [facility].

## 2015-02-27 ENCOUNTER — Telehealth: Payer: Self-pay

## 2015-02-27 NOTE — Telephone Encounter (Signed)
Denial of Crestor 5mg  from [facility]. He has tried and failed only 2 alternatives. Can you rx Pravastatin for him to try ? Will need a 90 day sent to [facility]

## 2015-02-27 NOTE — Telephone Encounter (Signed)
Pravastatin 40 mg daily with lipids/LFTs in 2 months.

## 2015-03-05 ENCOUNTER — Other Ambulatory Visit: Payer: Self-pay

## 2015-03-05 MED ORDER — PRAVASTATIN SODIUM 40 MG PO TABS: 40.0000 mg | ORAL_TABLET | Freq: Every evening | ORAL | Status: DC

## 2015-03-21 DIAGNOSIS — M542 Cervicalgia: Secondary | ICD-10-CM

## 2015-03-21 HISTORY — DX: Cervicalgia: M54.2

## 2015-04-02 ENCOUNTER — Encounter: Payer: Self-pay | Admitting: Physician Assistant

## 2015-04-02 ENCOUNTER — Ambulatory Visit (HOSPITAL_BASED_OUTPATIENT_CLINIC_OR_DEPARTMENT_OTHER)
Admission: RE | Admit: 2015-04-02 | Discharge: 2015-04-02 | Disposition: A | Discharge status: Home / Self Care | Source: Ambulatory Visit | Attending: Physician Assistant | Admitting: Physician Assistant

## 2015-04-02 ENCOUNTER — Telehealth: Payer: Self-pay | Admitting: *Deleted

## 2015-04-02 ENCOUNTER — Ambulatory Visit (INDEPENDENT_AMBULATORY_CARE_PROVIDER_SITE_OTHER): Admitting: Physician Assistant

## 2015-04-02 VITALS — BP 128/60 | HR 97 | Temp 98.6°F | Ht 68.5 in | Wt 256.6 lb

## 2015-04-02 DIAGNOSIS — G8929 Other chronic pain: Secondary | ICD-10-CM

## 2015-04-02 DIAGNOSIS — M62838 Other muscle spasm: Secondary | ICD-10-CM | POA: Insufficient documentation

## 2015-04-02 DIAGNOSIS — M6248 Contracture of muscle, other site: Secondary | ICD-10-CM

## 2015-04-02 DIAGNOSIS — Z23 Encounter for immunization: Secondary | ICD-10-CM

## 2015-04-02 DIAGNOSIS — M4802 Spinal stenosis, cervical region: Secondary | ICD-10-CM | POA: Insufficient documentation

## 2015-04-02 DIAGNOSIS — M542 Cervicalgia: Secondary | ICD-10-CM

## 2015-04-02 DIAGNOSIS — M50323 Other cervical disc degeneration at C6-C7 level: Secondary | ICD-10-CM | POA: Diagnosis not present

## 2015-04-02 DIAGNOSIS — M47812 Spondylosis without myelopathy or radiculopathy, cervical region: Secondary | ICD-10-CM

## 2015-04-02 DIAGNOSIS — M50322 Other cervical disc degeneration at C5-C6 level: Secondary | ICD-10-CM | POA: Diagnosis not present

## 2015-04-02 MED ORDER — CYCLOBENZAPRINE HCL 10 MG PO TABS: 10.0000 mg | ORAL_TABLET | Freq: Every day | ORAL | Status: DC

## 2015-04-02 MED ORDER — TRAMADOL HCL 50 MG PO TABS
50.0000 mg | ORAL_TABLET | Freq: Two times a day (BID) | ORAL | Status: DC | PRN
Start: 2015-04-02 — End: 2015-05-27

## 2015-04-02 NOTE — Assessment & Plan Note (Signed)
6 months. Will obtain x-ray to assess further. His acute symptoms are being treated -- see assessment/plan. Will alter regimen based on findings. 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

## 2015-04-02 NOTE — Patient Instructions (Signed)
Please go downstairs for x-ray. I will call you with your results.  Please take the Tramadol as directed for severe pain. Wean down to tylenol once pain is improving. Take Flexeril in the evening to help relax muscles. Restart your Voltaren gel. Avoid heavy lifting.  We are treating this flare-up but are getting the x-ray to assess your chronic symptoms. We will alter regimen based on your results.

## 2015-04-02 NOTE — Telephone Encounter (Signed)
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 This is cause of his chronic symptoms. I would like him to continue care discussed at today's visit but I would like to send him to spine specialist for further evaluation and management.

## 2015-04-02 NOTE — Progress Notes (Signed)
Pre visit review using our clinic review tool, if applicable. No additional management support is needed unless otherwise documented below in the visit note. 

## 2015-04-02 NOTE — Telephone Encounter (Signed)
Called and spoke with the pt and informed him of recent x-ray results and note.  Pt verbalized understanding and agreed to see the spine specialist.  Pt stated that he would like to see someone at [facility].  Referral to Ortho has been placed.//AB/CMA

## 2015-04-02 NOTE — Assessment & Plan Note (Signed)
Acute. 1 day of symptom. Rx Tramadol and Flexeril. Restart voltaren gel. Supportive measures reviewed.

## 2015-04-02 NOTE — Progress Notes (Signed)
Patient presents to clinic today c/o muscle tension and pain in the neck on waking. Denies heavy lifting or trauma. Denies radiation of pain, numbness or tingling. Endorses chronic neck pain over the past 6 months. Endorses injury to neck many years ago but had no residual symptoms. Body mass index is 38.44 kg/(m^2).  Past Medical History  Diagnosis Date  . DM (diabetes mellitus), type 2, uncontrolled ([place])   . Acid reflux disease   . Sleep apnea     a. CPAP  . Hx of colonic polyps   . Diverticulosis of colon   . Hypertension 07/14/2010  . Morbid obesity ([place]) 03/27/2010    a. 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  . 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 HYPERSOMNIA, ASSOCIATED WITH SLEEP APNEA 07/26/2008  . DIVERTICULOSIS, COLON 10/29/2008  . Diarrhea 06/13/2010  . COLONIC POLYPS, HX OF 10/29/2008  . Benign neoplasm of colon 07/05/2007  . ACID REFLUX DISEASE 07/05/2007  . Knee pain, left 10/10/2010  . Tear of meniscus of left knee 2012  . HTN (hypertension) 07/14/2010  . Breast pain, left 11/17/2011  . Preventative health care 11/17/2011  . Chest pain     a. Reportedly negative dobut echo performed prior to gastric bypass in 03/2011;  b. 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 12/2011 Cath: LM nl, LAD 50p, D1 47m, LCX min irregs, OM3 30, RCA 25p, 79m (FFR 0.99->0.89), PDA 30, EF 65%, Med Rx.  . Other and unspecified hyperlipidemia 11/16/2012  . Gout 03/04/2013  . GI bleed   . Atherosclerosis   . Anemia   . Hearing loss 05/24/2013    Previous audiology evaluation completely.  . Renal stone 11/2013    Current Outpatient Prescriptions on File Prior to Visit  Medication Sig Dispense Refill  . aspirin EC 81 MG tablet Take 1 tablet (81 mg total) by mouth daily.    . B Complex Vitamins (VITAMIN-B COMPLEX) TABS Place 1 tablet under the tongue daily.    . Cholecalciferol (VITAMIN D-3) 5000 UNITS TABS Take 1 tablet by mouth daily.    . Cyanocobalamin (VITAMIN B-12) 1000 MCG SUBL Place 1 each under the  tongue daily.    . fenofibrate 160 MG tablet TAKE 1 TABLET DAILY 90 tablet 0  . glucose blood test strip Use as directed once daily to check blood sugar.  Diagnosis code E11.9 100 each 3  . Krill Oil 1000 MG CAPS Take 2 tablets by mouth daily.    . metFORMIN (GLUCOPHAGE) 500 MG tablet Take 1 tablet (500 mg total) by mouth 4 (four) times daily. 360 tablet 2  . metoprolol tartrate (LOPRESSOR) 25 MG tablet TAKE ONE-HALF (1/2) TABLET TWICE A DAY 90 tablet 1  . Multiple Vitamin (MULTIVITAMIN) tablet Take 2 tablets by mouth daily. BARIATRIC VIT    . pantoprazole (PROTONIX) 40 MG tablet TAKE 1 TABLET TWICE A DAY 180 tablet 2  . [DISCONTINUED] ramipril (ALTACE) 10 MG tablet Take 1 tablet (10 mg total) by mouth daily. 2 tabs po daily 180 tablet 1   No current facility-administered medications on file prior to visit.    Allergies  Allergen Reactions  . Bee Venom Anaphylaxis  . Lipitor [Atorvastatin]     Myalgias, memory changes  . Morphine     hyperactive    Family History  Problem Relation Age of Onset  . Diabetes Mother   . Hypertension Mother   . Stroke Mother   . Hyperlipidemia Mother   . Hypertension Father   .  Colon polyps Father   . Heart attack Father 37  . Stroke Father   . Heart attack Brother   . Diabetes Brother   . Heart disease Brother   . Heart attack Brother     Multiple  . Diabetes Brother   . Other Brother     heart problems  . Heart disease Brother   . Diabetes Sister   . Obesity Brother   . Diabetes Brother   . Hypertension Maternal Grandmother   . ADD / ADHD Daughter   . Stomach cancer Neg Hx     Social History   Social History  . Marital Status: Married    Spouse Name: N/A  . Number of Children: 2  . Years of Education: N/A   Occupational History  . TEACHER    Social History Main Topics  . Smoking status: Former Smoker -- 1.50 packs/day for 20 years    Types: Cigarettes    Quit date: 04/21/1991  . Smokeless tobacco: Never Used  . Alcohol  Use: 0.0 oz/week    0 drink(s) per week     Comment:  occasionaly- social  . Drug Use: No  . Sexual Activity: Yes   Other Topics Concern  . None   Social History Narrative   Lives with wife in [place].  Does not routinely exercise.   Review of Systems - See HPI.  All other ROS are negative.  BP 128/60 mmHg  Pulse 97  Temp(Src) 98.6 F (37 C) (Oral)  Ht 5' 8.5" (1.74 m)  Wt 256 lb 9.6 oz (116.393 kg)  BMI 38.44 kg/m2  SpO2 98%  Physical Exam  Constitutional: He is oriented to person, place, and time and well-developed, well-nourished, and in no distress.  Eyes: Conjunctivae are normal.  Neck: Muscular tenderness present. No spinous process tenderness present. Normal range of motion present.  Cardiovascular: Normal rate, regular rhythm, normal heart sounds and intact distal pulses.   Pulmonary/Chest: Effort normal and breath sounds normal. No respiratory distress. He has no wheezes. He has no rales. He exhibits no tenderness.  Musculoskeletal:       Cervical back: He exhibits tenderness and spasm. He exhibits no bony tenderness.  Neurological: He is alert and oriented to person, place, and time.  Skin: Skin is warm and dry. No rash noted.  Vitals reviewed.  No results found for this or any previous visit (from the past 2160 hour(s)).  Assessment/Plan: Trapezius muscle spasm Acute. 1 day of symptom. Rx Tramadol and Flexeril. Restart voltaren gel. Supportive measures reviewed.  Chronic neck pain 6 months. Will obtain x-ray to assess further. His acute symptoms are being treated -- see assessment/plan. Will alter regimen based on findings. 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

## 2015-04-12 ENCOUNTER — Other Ambulatory Visit: Payer: Self-pay | Admitting: Family Medicine

## 2015-05-20 ENCOUNTER — Other Ambulatory Visit: Payer: Self-pay | Admitting: Family Medicine

## 2015-05-20 ENCOUNTER — Encounter: Payer: Self-pay | Admitting: Family Medicine

## 2015-05-20 DIAGNOSIS — E785 Hyperlipidemia, unspecified: Secondary | ICD-10-CM

## 2015-05-20 DIAGNOSIS — Z9884 Bariatric surgery status: Secondary | ICD-10-CM

## 2015-05-20 DIAGNOSIS — I10 Essential (primary) hypertension: Secondary | ICD-10-CM

## 2015-05-20 DIAGNOSIS — E119 Type 2 diabetes mellitus without complications: Secondary | ICD-10-CM

## 2015-05-20 NOTE — Telephone Encounter (Signed)
Labs ordered per PCP instructions and patient request by mychart.

## 2015-05-24 ENCOUNTER — Telehealth: Payer: Self-pay | Admitting: *Deleted

## 2015-05-24 ENCOUNTER — Encounter: Payer: Self-pay | Admitting: *Deleted

## 2015-05-24 NOTE — Telephone Encounter (Signed)
Pre-Visit Call completed with patient and chart updated.   Pre-Visit Info documented in Specialty Comments under [employer].    

## 2015-05-24 NOTE — Telephone Encounter (Signed)
Unable to reach patient at time of pre-visit call. Left message for patient to return call when available.  

## 2015-05-27 ENCOUNTER — Encounter: Payer: Self-pay | Admitting: Family Medicine

## 2015-05-27 ENCOUNTER — Ambulatory Visit (INDEPENDENT_AMBULATORY_CARE_PROVIDER_SITE_OTHER): Admitting: Family Medicine

## 2015-05-27 VITALS — BP 126/84 | HR 84 | Temp 98.4°F | Ht 69.0 in | Wt 252.4 lb

## 2015-05-27 DIAGNOSIS — Z0000 Encounter for general adult medical examination without abnormal findings: Secondary | ICD-10-CM | POA: Diagnosis not present

## 2015-05-27 DIAGNOSIS — Z9884 Bariatric surgery status: Secondary | ICD-10-CM

## 2015-05-27 DIAGNOSIS — M62838 Other muscle spasm: Secondary | ICD-10-CM

## 2015-05-27 DIAGNOSIS — I10 Essential (primary) hypertension: Secondary | ICD-10-CM

## 2015-05-27 DIAGNOSIS — M542 Cervicalgia: Secondary | ICD-10-CM

## 2015-05-27 DIAGNOSIS — M6248 Contracture of muscle, other site: Secondary | ICD-10-CM | POA: Diagnosis not present

## 2015-05-27 DIAGNOSIS — R079 Chest pain, unspecified: Secondary | ICD-10-CM

## 2015-05-27 DIAGNOSIS — Z23 Encounter for immunization: Secondary | ICD-10-CM

## 2015-05-27 DIAGNOSIS — G4733 Obstructive sleep apnea (adult) (pediatric): Secondary | ICD-10-CM

## 2015-05-27 DIAGNOSIS — Z9889 Other specified postprocedural states: Secondary | ICD-10-CM

## 2015-05-27 DIAGNOSIS — G8929 Other chronic pain: Secondary | ICD-10-CM

## 2015-05-27 DIAGNOSIS — E118 Type 2 diabetes mellitus with unspecified complications: Secondary | ICD-10-CM

## 2015-05-27 DIAGNOSIS — E782 Mixed hyperlipidemia: Secondary | ICD-10-CM

## 2015-05-27 DIAGNOSIS — E559 Vitamin D deficiency, unspecified: Secondary | ICD-10-CM

## 2015-05-27 DIAGNOSIS — M109 Gout, unspecified: Secondary | ICD-10-CM

## 2015-05-27 LAB — COMPREHENSIVE METABOLIC PANEL
ALT: 19 U/L (ref 0–53)
AST: 20 U/L (ref 0–37)
Albumin: 4.5 g/dL (ref 3.5–5.2)
Alkaline Phosphatase: 40 U/L (ref 39–117)
BUN: 16 mg/dL (ref 6–23)
CO2: 26 mEq/L (ref 19–32)
Calcium: 9.8 mg/dL (ref 8.4–10.5)
Chloride: 106 mEq/L (ref 96–112)
Creatinine, Ser: 0.96 mg/dL (ref 0.40–1.50)
GFR: 85.02 mL/min (ref >60.00)
Glucose, Bld: 149 mg/dL — ABNORMAL HIGH (ref 70–99)
Potassium: 4.3 mEq/L (ref 3.5–5.1)
Sodium: 142 mEq/L (ref 135–145)
Total Bilirubin: 0.4 mg/dL (ref 0.2–1.2)
Total Protein: 7.1 g/dL (ref 6.0–8.3)

## 2015-05-27 LAB — CBC
HCT: 39.7 % (ref 39.0–52.0)
Hemoglobin: 12.9 g/dL — ABNORMAL LOW (ref 13.0–17.0)
MCHC: 32.6 g/dL (ref 30.0–36.0)
MCV: 82.3 fl (ref 78.0–100.0)
Platelets: 257 10*3/uL (ref 150.0–400.0)
RBC: 4.82 Mil/uL (ref 4.22–5.81)
RDW: 14.9 % (ref 11.5–15.5)
WBC: 5.7 10*3/uL (ref 4.0–10.5)

## 2015-05-27 LAB — VITAMIN D 25 HYDROXY (VIT D DEFICIENCY, FRACTURES): VITD: 35.49 ng/mL (ref 30.00–100.00)

## 2015-05-27 LAB — HIV ANTIBODY (ROUTINE TESTING W REFLEX): HIV 1&2 Ab, 4th Generation: NONREACTIVE

## 2015-05-27 LAB — LIPID PANEL
Cholesterol: 135 mg/dL (ref 0–200)
HDL: 51.5 mg/dL (ref >39.00)
LDL Cholesterol: 68 mg/dL (ref 0–99)
NonHDL: 83.58
Total CHOL/HDL Ratio: 3
Triglycerides: 80 mg/dL (ref 0.0–149.0)
VLDL: 16 mg/dL (ref 0.0–40.0)

## 2015-05-27 LAB — TSH: TSH: 1.85 u[IU]/mL (ref 0.35–4.50)

## 2015-05-27 LAB — HEMOGLOBIN A1C: Hgb A1c MFr Bld: 6.8 % — ABNORMAL HIGH (ref 4.6–6.5)

## 2015-05-27 LAB — HEPATITIS C ANTIBODY: HCV Ab: NEGATIVE

## 2015-05-27 LAB — URIC ACID: Uric Acid, Serum: 5.7 mg/dL (ref 4.0–7.8)

## 2015-05-27 LAB — VITAMIN B12: Vitamin B-12: >1500 pg/mL — ABNORMAL HIGH (ref 211–911)

## 2015-05-27 MED ORDER — CYCLOBENZAPRINE HCL 10 MG PO TABS: 10.0000 mg | ORAL_TABLET | Freq: Every day | ORAL | Status: DC

## 2015-05-27 MED ORDER — TRAMADOL HCL 50 MG PO TABS: 50.0000 mg | ORAL_TABLET | Freq: Two times a day (BID) | ORAL | Status: DC | PRN

## 2015-05-27 NOTE — Assessment & Plan Note (Signed)
Following with orthopaedics and is noted for spinal stenosis, PT at [facility] today

## 2015-05-27 NOTE — Assessment & Plan Note (Signed)
hgba1c acceptable, minimize simple carbs. Increase exercise as tolerated. Continue current meds 

## 2015-05-27 NOTE — Progress Notes (Signed)
Brent [name] [id number] 1955-06-06 05/27/2015      Patient Progress Note   Subjective  Chief Complaint  Chief Complaint  Patient presents with  . Annual Exam    HPI  Patient returns for routine follow up. Overall doing well except for neck, diagnosed with spinal stenosis in December. Starting physical therapy for 2-3/week for 2-3 weeks at [facility]. Tramadol working well, denies sedation issues. Neck pain has not improved or worsened, has a general dull ache. Doing well with current medications. Has not had a kidney stone since two years ago. Saw urologist in summer and will continue to see as needed. Using CPAP nightly and doing well with that. Will get lab work today and vaccinations Patient denies shortness of breath, chest pain,changes in urination, GI issues, recent fevers or illnesses    Past Medical History  Diagnosis Date  . DM (diabetes mellitus), type 2, uncontrolled ([place])   . Acid reflux disease   . Sleep apnea     a. CPAP  . Hx of colonic polyps   . Diverticulosis of colon   . Hypertension 07/14/2010  . Morbid obesity ([place]) 03/27/2010    a. 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  . 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 HYPERSOMNIA, ASSOCIATED WITH SLEEP APNEA 07/26/2008  . DIVERTICULOSIS, COLON 10/29/2008  . Diarrhea 06/13/2010  . COLONIC POLYPS, HX OF 10/29/2008  . Benign neoplasm of colon 07/05/2007  . ACID REFLUX DISEASE 07/05/2007  . Knee pain, left 10/10/2010  . Tear of meniscus of left knee 2012  . HTN (hypertension) 07/14/2010  . Breast pain, left 11/17/2011  . Preventative health care 11/17/2011  . Chest pain     a. Reportedly negative dobut echo performed prior to gastric bypass in 03/2011;  b. 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 12/2011 Cath: LM nl, LAD 50p, D1 62m, LCX min irregs, OM3 30, RCA 25p, 45m (FFR 0.99->0.89), PDA 30, EF 65%, Med Rx.  . Other and unspecified hyperlipidemia 11/16/2012  . Gout 03/04/2013  . GI bleed   . Atherosclerosis   . Anemia   . Hearing  loss 05/24/2013    Previous audiology evaluation completely.  . Renal stone 11/2013  . Neck pain 03/2015    Past Surgical History  Procedure Laterality Date  . Ankle surgery  1994  . Tonsillectomy  age 60  . Colonoscopy polyps    . Knee arthroscopy  11/06/10    Left, torn meniscus (repaired)  . Colonoscopy    . Gastric bypass    . Hernia repair    . Upper gi endoscopy  03/27/13  . Esophagogastroduodenoscopy N/A 03/27/2013    Procedure: ESOPHAGOGASTRODUODENOSCOPY (EGD);  Surgeon: Irene Shipper, MD;  Location: [facility];  Service: Endoscopy;  Laterality: N/A;  . Cardiac catheterization      denies any chest pain in the past 2 years  . Left heart catheterization with coronary angiogram N/A 12/23/2011    Procedure: LEFT HEART CATHETERIZATION WITH CORONARY ANGIOGRAM;  Surgeon: Minus Breeding, MD;  Location: [facility] CATH LAB;  Service: Cardiovascular;  Laterality: N/A;  . Right knee arthroscopy Right 07/05/14    Dr. Hart Robinsons, [place].    Family History  Problem Relation Age of Onset  . Diabetes Mother   . Hypertension Mother   . Stroke Mother   . Hyperlipidemia Mother   . Hypertension Father   . Colon polyps Father   . Heart attack Father 60  . Stroke Father   . Heart attack  Brother   . Diabetes Brother   . Heart disease Brother   . Heart attack Brother     Multiple  . Diabetes Brother   . Other Brother     heart problems  . Heart disease Brother   . Diabetes Sister   . Obesity Brother   . Diabetes Brother   . Hypertension Maternal Grandmother   . ADD / ADHD Daughter   . Stomach cancer Neg Hx     Social History   Social History  . Marital Status: Married    Spouse Name: N/A  . Number of Children: 2  . Years of Education: N/A   Occupational History  . TEACHER    Social History Main Topics  . Smoking status: Former Smoker -- 1.50 packs/day for 20 years    Types: Cigarettes    Quit date: 04/21/1991  . Smokeless tobacco: Never Used  . Alcohol Use: 0.0 oz/week     0 Standard drinks or equivalent per week     Comment:  occasionaly- social  . Drug Use: No  . Sexual Activity: Yes   Other Topics Concern  . Not on file   Social History Narrative   Lives with wife in [place].  Does not routinely exercise.    Current Outpatient Prescriptions on File Prior to Visit  Medication Sig Dispense Refill  . aspirin EC 81 MG tablet Take 1 tablet (81 mg total) by mouth daily.    . B Complex Vitamins (VITAMIN-B COMPLEX) TABS Place 1 tablet under the tongue daily.    . Cholecalciferol (VITAMIN D-3) 5000 UNITS TABS Take 1 tablet by mouth daily.    . Cyanocobalamin (VITAMIN B-12) 1000 MCG SUBL Place 1 each under the tongue daily.    . cyclobenzaprine (FLEXERIL) 10 MG tablet Take 1 tablet (10 mg total) by mouth at bedtime. 30 tablet 0  . fenofibrate 160 MG tablet TAKE 1 TABLET DAILY 90 tablet 0  . glucose blood test strip Use as directed once daily to check blood sugar.  Diagnosis code E11.9 100 each 3  . Krill Oil 1000 MG CAPS Take 2 tablets by mouth daily.    . metFORMIN (GLUCOPHAGE) 500 MG tablet Take 1 tablet (500 mg total) by mouth 4 (four) times daily. 360 tablet 2  . metoprolol tartrate (LOPRESSOR) 25 MG tablet TAKE ONE-HALF (1/2) TABLET TWICE A DAY 90 tablet 1  . Multiple Vitamin (MULTIVITAMIN) tablet Take 2 tablets by mouth daily. BARIATRIC VIT    . pantoprazole (PROTONIX) 40 MG tablet TAKE 1 TABLET TWICE A DAY (Patient taking differently: TAKE 1 TABLET DAILY) 180 tablet 2  . pravastatin (PRAVACHOL) 40 MG tablet     . traMADol (ULTRAM) 50 MG tablet Take 1 tablet (50 mg total) by mouth every 12 (twelve) hours as needed. 30 tablet 0  . [DISCONTINUED] ramipril (ALTACE) 10 MG tablet Take 1 tablet (10 mg total) by mouth daily. 2 tabs po daily 180 tablet 1   No current facility-administered medications on file prior to visit.    Allergies  Allergen Reactions  . Bee Venom Anaphylaxis  . Lipitor [Atorvastatin]     Myalgias, memory changes  . Morphine      hyperactive    Review of Systems   Constitutional: Negative for fever and malaise/fatigue.  HENT: Negative for congestion.  Eyes: Negative for discharge.   Respiratory: Negative for shortness of breath.  Cardiovascular: Negative for chest pain, palpitations and leg swelling.  Gastrointestinal: Negative for nausea, abdominal pain and  diarrhea.  Genitourinary: Negative for dysuria and urgency, hematuria and flank pain.  Musculoskeletal: Negative for myalgias and falls.  Skin: Negative for rash.  Neurological: Negative for loss of consciousness and headaches.  Endo/Heme/Allergies: Negative for polydipsia.  Psychiatric/Behavioral: Negative for depression and suicidal ideas. The patient is not nervous/anxious and does not have insomnia.   Objective  BP 126/84 mmHg  Pulse 84  Temp(Src) 98.4 F (36.9 C) (Oral)  Ht 5\' 9"  (1.753 m)  Wt 252 lb 6 oz (114.477 kg)  BMI 37.25 kg/m2  SpO2 100%  Physical Exam   Constitutional: Oriented to person, place, and time. Appears well-nourished. No distress.  Eyes: EOM are normal. Pupils are equal, round, and reactive to light.  Cardiovascular: Normal rate and regular rhythm.  Pulmonary/Chest: Breath sounds normal.  Abdominal: Soft. Bowel sounds are normal.  Lymphadenopathy:   No cervical adenopathy.  Neurological: Alert and oriented to person, place, and time. Normal reflexes. No cranial nerve deficit.    Assessment & Plan  Essential hypertension -Well controlled, no changes to meds. -Encouraged heart healthy diet and exercise as tolerated.   Hyperlipidemia -Tolerating statin -Encouraged heart healthy diet, avoid trans fats, minimize simple carbs and saturated fats.  -Increase exercise as tolerated  Diabetes Mellitus Type 2, controlled -HgA1c acceptable -Minimize simple carbs, increase exercise as tolerated -Continue current meds  Obstructive Sleep Apnea -Compliant with CPAP, continue to use  Lab Work Ordered -CBC, CMP,  Lipid panel, A1c  Preventative -Pneumovax -HepC, HIV screening  Patient seen with and examined with student.  Agree with documentation See separate note for further documentation

## 2015-05-27 NOTE — Patient Instructions (Addendum)
Basic Carbohydrate Counting for Diabetes Mellitus Carbohydrate counting is a method for keeping track of the amount of carbohydrates you eat. Eating carbohydrates naturally increases the level of sugar (glucose) in your blood, so it is important for you to know the amount that is okay for you to have in every meal. Carbohydrate counting helps keep the level of glucose in your blood within normal limits. The amount of carbohydrates allowed is different for every person. A dietitian can help you calculate the amount that is right for you. Once you know the amount of carbohydrates you can have, you can count the carbohydrates in the foods you want to eat. Carbohydrates are found in the following foods:  Grains, such as breads and cereals.  Dried beans and soy products.  Starchy vegetables, such as potatoes, peas, and corn.  Fruit and fruit juices.  Milk and yogurt.  Sweets and snack foods, such as cake, cookies, candy, chips, soft drinks, and fruit drinks. CARBOHYDRATE COUNTING There are two ways to count the carbohydrates in your food. You can use either of the methods or a combination of both. Reading the "Nutrition Facts" on [place] The "Nutrition Facts" is an area that is included on the labels of almost all packaged food and beverages in the [place]. It includes the serving size of that food or beverage and information about the nutrients in each serving of the food, including the grams (g) of carbohydrate per serving.  Decide the number of servings of this food or beverage that you will be able to eat or drink. Multiply that number of servings by the number of grams of carbohydrate that is listed on the label for that serving. The total will be the amount of carbohydrates you will be having when you eat or drink this food or beverage. Learning Standard Serving Sizes of Food When you eat food that is not packaged or does not include "Nutrition Facts" on the label, you need to  measure the servings in order to count the amount of carbohydrates.A serving of most carbohydrate-rich foods contains about 15 g of carbohydrates. The following list includes serving sizes of carbohydrate-rich foods that provide 15 g ofcarbohydrate per serving:   1 slice of bread (1 oz) or 1 six-inch tortilla.    of a hamburger bun or English muffin.  4-6 crackers.   cup unsweetened dry cereal.    cup hot cereal.   cup rice or pasta.    cup mashed potatoes or  of a large baked potato.  1 cup fresh fruit or one small piece of fruit.    cup canned or frozen fruit or fruit juice.  1 cup milk.   cup plain fat-free yogurt or yogurt sweetened with artificial sweeteners.   cup cooked dried beans or starchy vegetable, such as peas, corn, or potatoes.  Decide the number of standard-size servings that you will eat. Multiply that number of servings by 15 (the grams of carbohydrates in that serving). For example, if you eat 2 cups of strawberries, you will have eaten 2 servings and 30 g of carbohydrates (2 servings x 15 g = 30 g). For foods such as soups and casseroles, in which more than one food is mixed in, you will need to count the carbohydrates in each food that is included. EXAMPLE OF CARBOHYDRATE COUNTING Sample Dinner  3 oz chicken breast.   cup of brown rice.   cup of corn.  1 cup milk.   1 cup strawberries with  sugar-free whipped topping.  Carbohydrate Calculation Step 1: Identify the foods that contain carbohydrates:   Rice.   Corn.   Milk.   Strawberries. Step 2:Calculate the number of servings eaten of each:   2 servings of rice.   1 serving of corn.   1 serving of milk.   1 serving of strawberries. Step 3: Multiply each of those number of servings by 15 g:   2 servings of rice x 15 g = 30 g.   1 serving of corn x 15 g = 15 g.   1 serving of milk x 15 g = 15 g.   1 serving of strawberries x 15 g = 15 g. Step 4: Add  together all of the amounts to find the total grams of carbohydrates eaten: 30 g + 15 g + 15 g + 15 g = 75 g.   This information is not intended to replace advice given to you by your health care provider. Make sure you discuss any questions you have with your health care provider.   Document Released: 04/06/2005 Document Revised: 04/27/2014 Document Reviewed: 03/03/2013 Elsevier Interactive Patient Education 2016 [place] for Adults, Male A healthy lifestyle and preventive care can promote health and wellness. Preventive health guidelines for men include the following key practices:  A routine yearly physical is a good way to check with your health care provider about your health and preventative screening. It is a chance to share any concerns and updates on your health and to receive a thorough exam.  Visit your dentist for a routine exam and preventative care every 6 months. Brush your teeth twice a day and floss once a day. Good oral hygiene prevents tooth decay and gum disease.  The frequency of eye exams is based on your age, health, family medical history, use of contact lenses, and other factors. Follow your health care provider's recommendations for frequency of eye exams.  Eat a healthy diet. Foods such as vegetables, fruits, whole grains, low-fat dairy products, and lean protein foods contain the nutrients you need without too many calories. Decrease your intake of foods high in solid fats, added sugars, and salt. Eat the right amount of calories for you.Get information about a proper diet from your health care provider, if necessary.  Regular physical exercise is one of the most important things you can do for your health. Most adults should get at least 150 minutes of moderate-intensity exercise (any activity that increases your heart rate and causes you to sweat) each week. In addition, most adults need muscle-strengthening exercises on 2 or more days a  week.  Maintain a healthy weight. The body mass index (BMI) is a screening tool to identify possible weight problems. It provides an estimate of body fat based on height and weight. Your health care provider can find your BMI and can help you achieve or maintain a healthy weight.For adults 20 years and older:  A BMI below 18.5 is considered underweight.  A BMI of 18.5 to 24.9 is normal.  A BMI of 25 to 29.9 is considered overweight.  A BMI of 30 and above is considered obese.  Maintain normal blood lipids and cholesterol levels by exercising and minimizing your intake of saturated fat. Eat a balanced diet with plenty of fruit and vegetables. Blood tests for lipids and cholesterol should begin at age 26 and be repeated every 5 years. If your lipid or cholesterol levels are high, you are over 50, or you  are at high risk for heart disease, you may need your cholesterol levels checked more frequently.Ongoing high lipid and cholesterol levels should be treated with medicines if diet and exercise are not working.  If you smoke, find out from your health care provider how to quit. If you do not use tobacco, do not start.  Lung cancer screening is recommended for adults aged 57-80 years who are at high risk for developing lung cancer because of a history of smoking. A yearly low-dose CT scan of the lungs is recommended for people who have at least a 30-pack-year history of smoking and are a current smoker or have quit within the past 15 years. A pack year of smoking is smoking an average of 1 pack of cigarettes a day for 1 year (for example: 1 pack a day for 30 years or 2 packs a day for 15 years). Yearly screening should continue until the smoker has stopped smoking for at least 15 years. Yearly screening should be stopped for people who develop a health problem that would prevent them from having lung cancer treatment.  If you choose to drink alcohol, do not have more than 2 drinks per day. One drink  is considered to be 12 ounces (355 mL) of beer, 5 ounces (148 mL) of wine, or 1.5 ounces (44 mL) of liquor.  Avoid use of street drugs. Do not share needles with anyone. Ask for help if you need support or instructions about stopping the use of drugs.  High blood pressure causes heart disease and increases the risk of stroke. Your blood pressure should be checked at least every 1-2 years. Ongoing high blood pressure should be treated with medicines, if weight loss and exercise are not effective.  If you are 69-46 years old, ask your health care provider if you should take aspirin to prevent heart disease.  Diabetes screening is done by taking a blood sample to check your blood glucose level after you have not eaten for a certain period of time (fasting). If you are not overweight and you do not have risk factors for diabetes, you should be screened once every 3 years starting at age 14. If you are overweight or obese and you are 71-74 years of age, you should be screened for diabetes every year as part of your cardiovascular risk assessment.  Colorectal cancer can be detected and often prevented. Most routine colorectal cancer screening begins at the age of 17 and continues through age 57. However, your health care provider may recommend screening at an earlier age if you have risk factors for colon cancer. On a yearly basis, your health care provider may provide home test kits to check for hidden blood in the stool. Use of a small camera at the end of a tube to directly examine the colon (sigmoidoscopy or colonoscopy) can detect the earliest forms of colorectal cancer. Talk to your health care provider about this at age 15, when routine screening begins. Direct exam of the colon should be repeated every 5-10 years through age 26, unless early forms of precancerous polyps or small growths are found.  People who are at an increased risk for hepatitis B should be screened for this virus. You are considered  at high risk for hepatitis B if:  You were born in a country where hepatitis B occurs often. Talk with your health care provider about which countries are considered high risk.  Your parents were born in a high-risk country and you have not  received a shot to protect against hepatitis B (hepatitis B vaccine).  You have HIV or AIDS.  You use needles to inject street drugs.  You live with, or have sex with, someone who has hepatitis B.  You are a man who has sex with other men (MSM).  You get hemodialysis treatment.  You take certain medicines for conditions such as cancer, organ transplantation, and autoimmune conditions.  Hepatitis C blood testing is recommended for all people born from [birth year] through 1965 and any individual with known risks for hepatitis C.  Practice safe sex. Use condoms and avoid high-risk sexual practices to reduce the spread of sexually transmitted infections (STIs). STIs include gonorrhea, chlamydia, syphilis, trichomonas, herpes, HPV, and human immunodeficiency virus (HIV). Herpes, HIV, and HPV are viral illnesses that have no cure. They can result in disability, cancer, and death.  If you are a man who has sex with other men, you should be screened at least once per year for:  HIV.  Urethral, rectal, and pharyngeal infection of gonorrhea, chlamydia, or both.  If you are at risk of being infected with HIV, it is recommended that you take a prescription medicine daily to prevent HIV infection. This is called preexposure prophylaxis (PrEP). You are considered at risk if:  You are a man who has sex with other men (MSM) and have other risk factors.  You are a heterosexual man, are sexually active, and are at increased risk for HIV infection.  You take drugs by injection.  You are sexually active with a partner who has HIV.  Talk with your health care provider about whether you are at high risk of being infected with HIV. If you choose to begin PrEP, you should  first be tested for HIV. You should then be tested every 3 months for as long as you are taking PrEP.  A one-time screening for abdominal aortic aneurysm (AAA) and surgical repair of large AAAs by ultrasound are recommended for men ages 70 to 80 years who are current or former smokers.  Healthy men should no longer receive prostate-specific antigen (PSA) blood tests as part of routine cancer screening. Talk with your health care provider about prostate cancer screening.  Testicular cancer screening is not recommended for adult males who have no symptoms. Screening includes self-exam, a health care provider exam, and other screening tests. Consult with your health care provider about any symptoms you have or any concerns you have about testicular cancer.  Use sunscreen. Apply sunscreen liberally and repeatedly throughout the day. You should seek shade when your shadow is shorter than you. Protect yourself by wearing long sleeves, pants, a wide-brimmed hat, and sunglasses year round, whenever you are outdoors.  Once a month, do a whole-body skin exam, using a mirror to look at the skin on your back. Tell your health care provider about new moles, moles that have irregular borders, moles that are larger than a pencil eraser, or moles that have changed in shape or color.  Stay current with required vaccines (immunizations).  Influenza vaccine. All adults should be immunized every year.  Tetanus, diphtheria, and acellular pertussis (Td, Tdap) vaccine. An adult who has not previously received Tdap or who does not know his vaccine status should receive 1 dose of Tdap. This initial dose should be followed by tetanus and diphtheria toxoids (Td) booster doses every 10 years. Adults with an unknown or incomplete history of completing a 3-dose immunization series with Td-containing vaccines should begin or complete a primary  immunization series including a Tdap dose. Adults should receive a Td booster every 10  years.  Varicella vaccine. An adult without evidence of immunity to varicella should receive 2 doses or a second dose if he has previously received 1 dose.  Human papillomavirus (HPV) vaccine. Males aged 11-21 years who have not received the vaccine previously should receive the 3-dose series. Males aged 22-26 years may be immunized. Immunization is recommended through the age of 4 years for any male who has sex with males and did not get any or all doses earlier. Immunization is recommended for any person with an immunocompromised condition through the age of 26 years if he did not get any or all doses earlier. During the 3-dose series, the second dose should be obtained 4-8 weeks after the first dose. The third dose should be obtained 24 weeks after the first dose and 16 weeks after the second dose.  Zoster vaccine. One dose is recommended for adults aged 58 years or older unless certain conditions are present.  Measles, mumps, and rubella (MMR) vaccine. Adults born before 58 generally are considered immune to measles and mumps. Adults born in [birth year] or later should have 1 or more doses of MMR vaccine unless there is a contraindication to the vaccine or there is laboratory evidence of immunity to each of the three diseases. A routine second dose of MMR vaccine should be obtained at least 28 days after the first dose for students attending postsecondary schools, health care workers, or international travelers. People who received inactivated measles vaccine or an unknown type of measles vaccine during 1963-1967 should receive 2 doses of MMR vaccine. People who received inactivated mumps vaccine or an unknown type of mumps vaccine before 1979 and are at high risk for mumps infection should consider immunization with 2 doses of MMR vaccine. Unvaccinated health care workers born before 60 who lack laboratory evidence of measles, mumps, or rubella immunity or laboratory confirmation of disease should  consider measles and mumps immunization with 2 doses of MMR vaccine or rubella immunization with 1 dose of MMR vaccine.  Pneumococcal 13-valent conjugate (PCV13) vaccine. When indicated, a person who is uncertain of his immunization history and has no record of immunization should receive the PCV13 vaccine. All adults 96 years of age and older should receive this vaccine. An adult aged 37 years or older who has certain medical conditions and has not been previously immunized should receive 1 dose of PCV13 vaccine. This PCV13 should be followed with a dose of pneumococcal polysaccharide (PPSV23) vaccine. Adults who are at high risk for pneumococcal disease should obtain the PPSV23 vaccine at least 8 weeks after the dose of PCV13 vaccine. Adults older than 60 years of age who have normal immune system function should obtain the PPSV23 vaccine dose at least 1 year after the dose of PCV13 vaccine.  Pneumococcal polysaccharide (PPSV23) vaccine. When PCV13 is also indicated, PCV13 should be obtained first. All adults aged 46 years and older should be immunized. An adult younger than age 24 years who has certain medical conditions should be immunized. Any person who resides in a nursing home or long-term care facility should be immunized. An adult smoker should be immunized. People with an immunocompromised condition and certain other conditions should receive both PCV13 and PPSV23 vaccines. People with human immunodeficiency virus (HIV) infection should be immunized as soon as possible after diagnosis. Immunization during chemotherapy or radiation therapy should be avoided. Routine use of PPSV23 vaccine is not recommended for  American Indians, [place], or people younger than 55 years unless there are medical conditions that require PPSV23 vaccine. When indicated, people who have unknown immunization and have no record of immunization should receive PPSV23 vaccine. One-time revaccination 5 years after the first  dose of PPSV23 is recommended for people aged 19-64 years who have chronic kidney failure, nephrotic syndrome, asplenia, or immunocompromised conditions. People who received 1-2 doses of PPSV23 before age 90 years should receive another dose of PPSV23 vaccine at age 39 years or later if at least 5 years have passed since the previous dose. Doses of PPSV23 are not needed for people immunized with PPSV23 at or after age 78 years.  Meningococcal vaccine. Adults with asplenia or persistent complement component deficiencies should receive 2 doses of quadrivalent meningococcal conjugate (MenACWY-D) vaccine. The doses should be obtained at least 2 months apart. Microbiologists working with certain meningococcal bacteria, [employer] recruits, people at risk during an outbreak, and people who travel to or live in countries with a high rate of meningitis should be immunized. A first-year college student up through age 44 years who is living in a residence hall should receive a dose if he did not receive a dose on or after his 16th birthday. Adults who have certain high-risk conditions should receive one or more doses of vaccine.  Hepatitis A vaccine. Adults who wish to be protected from this disease, have chronic liver disease, work with hepatitis A-infected animals, work in hepatitis A research labs, or travel to or work in countries with a high rate of hepatitis A should be immunized. Adults who were previously unvaccinated and who anticipate close contact with an international adoptee during the first 60 days after arrival in the [place] from a country with a high rate of hepatitis A should be immunized.  Hepatitis B vaccine. Adults should be immunized if they wish to be protected from this disease, are under age 2 years and have diabetes, have chronic liver disease, have had more than one sex partner in the past 6 months, may be exposed to blood or other infectious body fluids, are household contacts or sex  partners of hepatitis B positive people, are clients or workers in certain care facilities, or travel to or work in countries with a high rate of hepatitis B.  Haemophilus influenzae type b (Hib) vaccine. A previously unvaccinated person with asplenia or sickle cell disease or having a scheduled splenectomy should receive 1 dose of Hib vaccine. Regardless of previous immunization, a recipient of a hematopoietic stem cell transplant should receive a 3-dose series 6-12 months after his successful transplant. Hib vaccine is not recommended for adults with HIV infection. Preventive Service / Frequency Ages 72 to 31  Blood pressure check.** / Every 3-5 years.  Lipid and cholesterol check.** / Every 5 years beginning at age 21.  Hepatitis C blood test.** / For any individual with known risks for hepatitis C.  Skin self-exam. / Monthly.  Influenza vaccine. / Every year.  Tetanus, diphtheria, and acellular pertussis (Tdap, Td) vaccine.** / Consult your health care provider. 1 dose of Td every 10 years.  Varicella vaccine.** / Consult your health care provider.  HPV vaccine. / 3 doses over 6 months, if 61 or younger.  Measles, mumps, rubella (MMR) vaccine.** / You need at least 1 dose of MMR if you were born in 1957 or later. You may also need a second dose.  Pneumococcal 13-valent conjugate (PCV13) vaccine.** / Consult your health care provider.  Pneumococcal  polysaccharide (PPSV23) vaccine.** / 1 to 2 doses if you smoke cigarettes or if you have certain conditions.  Meningococcal vaccine.** / 1 dose if you are age 47 to 21 years and a Orthoptist living in a residence hall, or have one of several medical conditions. You may also need additional booster doses.  Hepatitis A vaccine.** / Consult your health care provider.  Hepatitis B vaccine.** / Consult your health care provider.  Haemophilus influenzae type b (Hib) vaccine.** / Consult your health care provider. Ages 28 to  36  Blood pressure check.** / Every year.  Lipid and cholesterol check.** / Every 5 years beginning at age 8.  Lung cancer screening. / Every year if you are aged 55-80 years and have a 30-pack-year history of smoking and currently smoke or have quit within the past 15 years. Yearly screening is stopped once you have quit smoking for at least 15 years or develop a health problem that would prevent you from having lung cancer treatment.  Fecal occult blood test (FOBT) of stool. / Every year beginning at age 104 and continuing until age 2. You may not have to do this test if you get a colonoscopy every 10 years.  Flexible sigmoidoscopy** or colonoscopy.** / Every 5 years for a flexible sigmoidoscopy or every 10 years for a colonoscopy beginning at age 32 and continuing until age 5.  Hepatitis C blood test.** / For all people born from [birth year] through 1965 and any individual with known risks for hepatitis C.  Skin self-exam. / Monthly.  Influenza vaccine. / Every year.  Tetanus, diphtheria, and acellular pertussis (Tdap/Td) vaccine.** / Consult your health care provider. 1 dose of Td every 10 years.  Varicella vaccine.** / Consult your health care provider.  Zoster vaccine.** / 1 dose for adults aged 69 years or older.  Measles, mumps, rubella (MMR) vaccine.** / You need at least 1 dose of MMR if you were born in 1957 or later. You may also need a second dose.  Pneumococcal 13-valent conjugate (PCV13) vaccine.** / Consult your health care provider.  Pneumococcal polysaccharide (PPSV23) vaccine.** / 1 to 2 doses if you smoke cigarettes or if you have certain conditions.  Meningococcal vaccine.** / Consult your health care provider.  Hepatitis A vaccine.** / Consult your health care provider.  Hepatitis B vaccine.** / Consult your health care provider.  Haemophilus influenzae type b (Hib) vaccine.** / Consult your health care provider. Ages 44 and over  Blood pressure check.** /  Every year.  Lipid and cholesterol check.**/ Every 5 years beginning at age 87.  Lung cancer screening. / Every year if you are aged 55-80 years and have a 30-pack-year history of smoking and currently smoke or have quit within the past 15 years. Yearly screening is stopped once you have quit smoking for at least 15 years or develop a health problem that would prevent you from having lung cancer treatment.  Fecal occult blood test (FOBT) of stool. / Every year beginning at age 22 and continuing until age 6. You may not have to do this test if you get a colonoscopy every 10 years.  Flexible sigmoidoscopy** or colonoscopy.** / Every 5 years for a flexible sigmoidoscopy or every 10 years for a colonoscopy beginning at age 67 and continuing until age 28.  Hepatitis C blood test.** / For all people born from [birth year] through 1965 and any individual with known risks for hepatitis C.  Abdominal aortic aneurysm (AAA) screening.** / A one-time screening  for ages 59 to 55 years who are current or former smokers.  Skin self-exam. / Monthly.  Influenza vaccine. / Every year.  Tetanus, diphtheria, and acellular pertussis (Tdap/Td) vaccine.** / 1 dose of Td every 10 years.  Varicella vaccine.** / Consult your health care provider.  Zoster vaccine.** / 1 dose for adults aged 66 years or older.  Pneumococcal 13-valent conjugate (PCV13) vaccine.** / 1 dose for all adults aged 82 years and older.  Pneumococcal polysaccharide (PPSV23) vaccine.** / 1 dose for all adults aged 3 years and older.  Meningococcal vaccine.** / Consult your health care provider.  Hepatitis A vaccine.** / Consult your health care provider.  Hepatitis B vaccine.** / Consult your health care provider.  Haemophilus influenzae type b (Hib) vaccine.** / Consult your health care provider. **Family history and personal history of risk and conditions may change your health care provider's recommendations.   This information is not  intended to replace advice given to you by your health care provider. Make sure you discuss any questions you have with your health care provider.   Document Released: 06/02/2001 Document Revised: 04/27/2014 Document Reviewed: 09/01/2010 Elsevier Interactive Patient Education [employer].

## 2015-05-27 NOTE — Progress Notes (Signed)
Pre visit review using our clinic review tool, if applicable. No additional management support is needed unless otherwise documented below in the visit note. 

## 2015-05-27 NOTE — Progress Notes (Signed)
HPI        ROS  Physical Exam  .

## 2015-06-02 NOTE — Assessment & Plan Note (Signed)
Patient encouraged to maintain heart healthy diet, regular exercise, adequate sleep. Consider daily probiotics. Take medications as prescribed. Labs reviewed. Discussed ACP

## 2015-06-02 NOTE — Assessment & Plan Note (Signed)
Uses CPAP routinely 

## 2015-06-02 NOTE — Assessment & Plan Note (Signed)
Nutritional markers look good, vitamin B 12 overtreated. Encouraged to decrease supplements.

## 2015-06-02 NOTE — Assessment & Plan Note (Signed)
Tolerating statin, encouraged heart healthy diet, avoid trans fats, minimize simple carbs and saturated fats. Increase exercise as tolerated 

## 2015-06-02 NOTE — Assessment & Plan Note (Signed)
Encouraged moist heat, gentle stretching, current meds prn and has PT set up to start soon.

## 2015-06-02 NOTE — Assessment & Plan Note (Signed)
Well controlled, no changes to meds. Encouraged heart healthy diet such as the DASH diet and exercise as tolerated.  °

## 2015-06-02 NOTE — Progress Notes (Signed)
Patient ID: Brent [name], male   DOB: 03-21-1956, 60 y.o.   MRN: [id number]   Subjective:    Patient ID: Brent [name], male    DOB: 1955-11-14, 60 y.o.   MRN: [id number]  Chief Complaint  Patient presents with  . Annual Exam    HPI  Patient returns for routine annual exam. Overall doing well except for neck, diagnosed with spinal stenosis in December. Starting physical therapy for 2-3/week for 2-3 weeks at [facility] Tramadol working well, denies sedation issues. Neck pain has not improved or worsened, has a general dull ache. Doing well with current medications. Has not had a kidney stone since two years ago. Saw urologist in summer and will continue to see as needed. Using CPAP nightly and doing well with that. Will get lab work today and vaccinations. Denies CP/palp/SOB/HA/congestion/fevers/GI or GU c/o. Taking meds as prescribed Past Medical History  Diagnosis Date  . DM (diabetes mellitus), type 2, uncontrolled ([place])   . Acid reflux disease   . Sleep apnea     a. CPAP  . Hx of colonic polyps   . Diverticulosis of colon   . Hypertension 07/14/2010  . Morbid obesity ([place]) 03/27/2010    a. 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  . 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 HYPERSOMNIA, ASSOCIATED WITH SLEEP APNEA 07/26/2008  . DIVERTICULOSIS, COLON 10/29/2008  . Diarrhea 06/13/2010  . COLONIC POLYPS, HX OF 10/29/2008  . Benign neoplasm of colon 07/05/2007  . ACID REFLUX DISEASE 07/05/2007  . Knee pain, left 10/10/2010  . Tear of meniscus of left knee 2012  . HTN (hypertension) 07/14/2010  . Breast pain, left 11/17/2011  . Preventative health care 11/17/2011  . Chest pain     a. Reportedly negative dobut echo performed prior to gastric bypass in 03/2011;  b. 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 12/2011 Cath: LM nl, LAD 50p, D1 25m, LCX min irregs, OM3 30, RCA 25p, 44m (FFR 0.99->0.89), PDA 30, EF 65%, Med Rx.  . Other and unspecified hyperlipidemia 11/16/2012  . Gout 03/04/2013  . GI bleed   .  Atherosclerosis   . Anemia   . Hearing loss 05/24/2013    Previous audiology evaluation completely.  . Renal stone 11/2013  . Neck pain 03/2015    Past Surgical History  Procedure Laterality Date  . Ankle surgery  1994  . Tonsillectomy  age 82  . Colonoscopy polyps    . Knee arthroscopy  11/06/10    Left, torn meniscus (repaired)  . Colonoscopy    . Gastric bypass    . Hernia repair    . Upper gi endoscopy  03/27/13  . Esophagogastroduodenoscopy N/A 03/27/2013    Procedure: ESOPHAGOGASTRODUODENOSCOPY (EGD);  Surgeon: Irene Shipper, MD;  Location: [facility];  Service: Endoscopy;  Laterality: N/A;  . Cardiac catheterization      denies any chest pain in the past 2 years  . Left heart catheterization with coronary angiogram N/A 12/23/2011    Procedure: LEFT HEART CATHETERIZATION WITH CORONARY ANGIOGRAM;  Surgeon: Minus Breeding, MD;  Location: [facility] CATH LAB;  Service: Cardiovascular;  Laterality: N/A;  . Right knee arthroscopy Right 07/05/14    Dr. Hart Robinsons, [place].    Family History  Problem Relation Age of Onset  . Diabetes Mother   . Hypertension Mother   . Stroke Mother   . Hyperlipidemia Mother   . Hypertension Father   . Colon polyps Father   . Heart attack Father  75  . Stroke Father   . Heart attack Brother   . Diabetes Brother   . Heart disease Brother   . Heart attack Brother     Multiple  . Diabetes Brother   . Other Brother     heart problems  . Heart disease Brother   . Diabetes Sister   . Obesity Brother   . Diabetes Brother   . Hypertension Maternal Grandmother   . ADD / ADHD Daughter   . Stomach cancer Neg Hx     Social History   Social History  . Marital Status: Married    Spouse Name: N/A  . Number of Children: 2  . Years of Education: N/A   Occupational History  . TEACHER    Social History Main Topics  . Smoking status: Former Smoker -- 1.50 packs/day for 20 years    Types: Cigarettes    Quit date: 04/21/1991  . Smokeless tobacco:  Never Used  . Alcohol Use: 0.0 oz/week    0 Standard drinks or equivalent per week     Comment:  occasionaly- social  . Drug Use: No  . Sexual Activity: Yes   Other Topics Concern  . Not on file   Social History Narrative   Lives with wife in [place].  Does not routinely exercise.    Outpatient Prescriptions Prior to Visit  Medication Sig Dispense Refill  . aspirin EC 81 MG tablet Take 1 tablet (81 mg total) by mouth daily.    . B Complex Vitamins (VITAMIN-B COMPLEX) TABS Place 1 tablet under the tongue daily.    . Cholecalciferol (VITAMIN D-3) 5000 UNITS TABS Take 1 tablet by mouth daily.    . Cyanocobalamin (VITAMIN B-12) 1000 MCG SUBL Place 1 each under the tongue daily.    . fenofibrate 160 MG tablet TAKE 1 TABLET DAILY 90 tablet 0  . glucose blood test strip Use as directed once daily to check blood sugar.  Diagnosis code E11.9 100 each 3  . Krill Oil 1000 MG CAPS Take 2 tablets by mouth daily.    . metFORMIN (GLUCOPHAGE) 500 MG tablet Take 1 tablet (500 mg total) by mouth 4 (four) times daily. 360 tablet 2  . metoprolol tartrate (LOPRESSOR) 25 MG tablet TAKE ONE-HALF (1/2) TABLET TWICE A DAY 90 tablet 1  . Multiple Vitamin (MULTIVITAMIN) tablet Take 2 tablets by mouth daily. BARIATRIC VIT    . pantoprazole (PROTONIX) 40 MG tablet TAKE 1 TABLET TWICE A DAY (Patient taking differently: TAKE 1 TABLET DAILY) 180 tablet 2  . pravastatin (PRAVACHOL) 40 MG tablet     . cyclobenzaprine (FLEXERIL) 10 MG tablet Take 1 tablet (10 mg total) by mouth at bedtime. 30 tablet 0  . traMADol (ULTRAM) 50 MG tablet Take 1 tablet (50 mg total) by mouth every 12 (twelve) hours as needed. 30 tablet 0   No facility-administered medications prior to visit.    Allergies  Allergen Reactions  . Bee Venom Anaphylaxis  . Lipitor [Atorvastatin]     Myalgias, memory changes  . Morphine     hyperactive    Review of Systems  Constitutional: Negative for fever, chills and malaise/fatigue.  HENT:  Negative for congestion and hearing loss.   Eyes: Negative for discharge.  Respiratory: Negative for cough, sputum production and shortness of breath.   Cardiovascular: Negative for chest pain, palpitations and leg swelling.  Gastrointestinal: Negative for heartburn, nausea, vomiting, abdominal pain, diarrhea, constipation and blood in stool.  Genitourinary: Negative for dysuria,  urgency, frequency and hematuria.  Musculoskeletal: Positive for neck pain. Negative for myalgias, back pain and falls.  Skin: Negative for rash.  Neurological: Negative for dizziness, sensory change, loss of consciousness, weakness and headaches.  Endo/Heme/Allergies: Negative for environmental allergies. Does not bruise/bleed easily.  Psychiatric/Behavioral: Negative for depression and suicidal ideas. The patient is not nervous/anxious and does not have insomnia.        Objective:    Physical Exam  Constitutional: He is oriented to person, place, and time. He appears well-developed and well-nourished. No distress.  HENT:  Head: Normocephalic and atraumatic.  Eyes: Conjunctivae are normal.  Neck: Neck supple. No thyromegaly present.  Cardiovascular: Normal rate, regular rhythm and normal heart sounds.   No murmur heard. Pulmonary/Chest: Effort normal and breath sounds normal. No respiratory distress. He has no wheezes.  Abdominal: Soft. Bowel sounds are normal. He exhibits no mass. There is no tenderness.  Musculoskeletal: He exhibits no edema.  Lymphadenopathy:    He has no cervical adenopathy.  Neurological: He is alert and oriented to person, place, and time.  Skin: Skin is warm and dry.  Psychiatric: He has a normal mood and affect. His behavior is normal.    BP 126/84 mmHg  Pulse 84  Temp(Src) 98.4 F (36.9 C) (Oral)  Ht 5\' 9"  (1.753 m)  Wt 252 lb 6 oz (114.477 kg)  BMI 37.25 kg/m2  SpO2 100% Wt Readings from Last 3 Encounters:  05/27/15 252 lb 6 oz (114.477 kg)  04/02/15 256 lb 9.6 oz  (116.393 kg)  02/21/15 255 lb 1.9 oz (115.722 kg)     Lab Results  Component Value Date   WBC 5.7 05/27/2015   HGB 12.9* 05/27/2015   HCT 39.7 05/27/2015   PLT 257.0 05/27/2015   GLUCOSE 149* 05/27/2015   CHOL 135 05/27/2015   TRIG 80.0 05/27/2015   HDL 51.50 05/27/2015   LDLDIRECT 110.3 02/11/2011   LDLCALC 68 05/27/2015   ALT 19 05/27/2015   AST 20 05/27/2015   NA 142 05/27/2015   K 4.3 05/27/2015   CL 106 05/27/2015   CREATININE 0.96 05/27/2015   BUN 16 05/27/2015   CO2 26 05/27/2015   TSH 1.85 05/27/2015   PSA 0.84 11/15/2014   INR 1.13 03/29/2013   HGBA1C 6.8* 05/27/2015   MICROALBUR 0.3 05/18/2006    Lab Results  Component Value Date   TSH 1.85 05/27/2015   Lab Results  Component Value Date   WBC 5.7 05/27/2015   HGB 12.9* 05/27/2015   HCT 39.7 05/27/2015   MCV 82.3 05/27/2015   PLT 257.0 05/27/2015   Lab Results  Component Value Date   NA 142 05/27/2015   K 4.3 05/27/2015   CO2 26 05/27/2015   GLUCOSE 149* 05/27/2015   BUN 16 05/27/2015   CREATININE 0.96 05/27/2015   BILITOT 0.4 05/27/2015   ALKPHOS 40 05/27/2015   AST 20 05/27/2015   ALT 19 05/27/2015   PROT 7.1 05/27/2015   ALBUMIN 4.5 05/27/2015   CALCIUM 9.8 05/27/2015   ANIONGAP 14 12/03/2013   GFR 85.02 05/27/2015   Lab Results  Component Value Date   CHOL 135 05/27/2015   Lab Results  Component Value Date   HDL 51.50 05/27/2015   Lab Results  Component Value Date   LDLCALC 68 05/27/2015   Lab Results  Component Value Date   TRIG 80.0 05/27/2015   Lab Results  Component Value Date   CHOLHDL 3 05/27/2015   Lab Results  Component Value Date  HGBA1C 6.8* 05/27/2015       Assessment & Plan:   Problem List Items Addressed This Visit    Avitaminosis D    WNL on check today      Chest pain   Relevant Orders   TSH (Completed)   CBC (Completed)   Comprehensive metabolic panel (Completed)   Lipid panel (Completed)   Hepatitis C antibody (Completed)   VITAMIN D  25 Hydroxy (Vit-D Deficiency, Fractures) (Completed)   Vitamin B12 (Completed)   HIV antibody (with reflex) (Completed)   Hemoglobin A1c (Completed)   Uric acid (Completed)   Chronic neck pain    Following with orthopaedics and is noted for spinal stenosis, PT at [facility] today      Relevant Medications   traMADol (ULTRAM) 50 MG tablet   cyclobenzaprine (FLEXERIL) 10 MG tablet   Other Relevant Orders   TSH (Completed)   CBC (Completed)   Comprehensive metabolic panel (Completed)   Lipid panel (Completed)   Hepatitis C antibody (Completed)   VITAMIN D 25 Hydroxy (Vit-D Deficiency, Fractures) (Completed)   Vitamin B12 (Completed)   HIV antibody (with reflex) (Completed)   Hemoglobin A1c (Completed)   Uric acid (Completed)   Diabetes type 2, controlled (HCC)    hgba1c acceptable, minimize simple carbs. Increase exercise as tolerated. Continue current meds      Relevant Orders   TSH (Completed)   CBC (Completed)   Comprehensive metabolic panel (Completed)   Lipid panel (Completed)   Hepatitis C antibody (Completed)   VITAMIN D 25 Hydroxy (Vit-D Deficiency, Fractures) (Completed)   Vitamin B12 (Completed)   HIV antibody (with reflex) (Completed)   Hemoglobin A1c (Completed)   Uric acid (Completed)   Gout   Relevant Orders   TSH (Completed)   CBC (Completed)   Comprehensive metabolic panel (Completed)   Lipid panel (Completed)   Hepatitis C antibody (Completed)   VITAMIN D 25 Hydroxy (Vit-D Deficiency, Fractures) (Completed)   Vitamin B12 (Completed)   HIV antibody (with reflex) (Completed)   Hemoglobin A1c (Completed)   Uric acid (Completed)   H/O gastric bypass    Nutritional markers look good, vitamin B 12 overtreated. Encouraged to decrease supplements.      HTN (hypertension)    Well controlled, no changes to meds. Encouraged heart healthy diet such as the DASH diet and exercise as tolerated.       Relevant Orders   TSH (Completed)   CBC (Completed)    Comprehensive metabolic panel (Completed)   Lipid panel (Completed)   Hepatitis C antibody (Completed)   VITAMIN D 25 Hydroxy (Vit-D Deficiency, Fractures) (Completed)   Vitamin B12 (Completed)   HIV antibody (with reflex) (Completed)   Hemoglobin A1c (Completed)   Uric acid (Completed)   Hyperlipidemia, mixed    Tolerating statin, encouraged heart healthy diet, avoid trans fats, minimize simple carbs and saturated fats. Increase exercise as tolerated      Relevant Orders   TSH (Completed)   CBC (Completed)   Comprehensive metabolic panel (Completed)   Lipid panel (Completed)   Hepatitis C antibody (Completed)   VITAMIN D 25 Hydroxy (Vit-D Deficiency, Fractures) (Completed)   Vitamin B12 (Completed)   HIV antibody (with reflex) (Completed)   Hemoglobin A1c (Completed)   Uric acid (Completed)   Morbid obesity (HCC)   Relevant Orders   TSH (Completed)   CBC (Completed)   Comprehensive metabolic panel (Completed)   Lipid panel (Completed)   Hepatitis C antibody (Completed)   VITAMIN D 25  Hydroxy (Vit-D Deficiency, Fractures) (Completed)   Vitamin B12 (Completed)   HIV antibody (with reflex) (Completed)   Hemoglobin A1c (Completed)   Uric acid (Completed)   OSA (obstructive sleep apnea)    Uses CPAP routinely.       Preventative health care - Primary    Patient encouraged to maintain heart healthy diet, regular exercise, adequate sleep. Consider daily probiotics. Take medications as prescribed. Labs reviewed. Discussed ACP       Trapezius muscle spasm    Encouraged moist heat, gentle stretching, current meds prn and has PT set up to start soon.      Relevant Medications   traMADol (ULTRAM) 50 MG tablet   cyclobenzaprine (FLEXERIL) 10 MG tablet   Other Relevant Orders   TSH (Completed)   CBC (Completed)   Comprehensive metabolic panel (Completed)   Lipid panel (Completed)   Hepatitis C antibody (Completed)   VITAMIN D 25 Hydroxy (Vit-D Deficiency, Fractures)  (Completed)   Vitamin B12 (Completed)   HIV antibody (with reflex) (Completed)   Hemoglobin A1c (Completed)   Uric acid (Completed)    Other Visit Diagnoses    Need for 23-polyvalent pneumococcal polysaccharide vaccine        Relevant Orders    Pneumococcal polysaccharide vaccine 23-valent greater than or equal to 2yo subcutaneous/IM (Completed)       I am having Mr. Pullium maintain his multivitamin, Vitamin B-12, Vitamin D-3, Vitamin-B Complex, Krill Oil, glucose blood, metFORMIN, pantoprazole, metoprolol tartrate, aspirin EC, fenofibrate, pravastatin, traMADol, and cyclobenzaprine.  Meds ordered this encounter  Medications  . traMADol (ULTRAM) 50 MG tablet    Sig: Take 1 tablet (50 mg total) by mouth every 12 (twelve) hours as needed.    Dispense:  30 tablet    Refill:  0  . cyclobenzaprine (FLEXERIL) 10 MG tablet    Sig: Take 1 tablet (10 mg total) by mouth at bedtime.    Dispense:  30 tablet    Refill:  0     [name], MD

## 2015-06-02 NOTE — Assessment & Plan Note (Signed)
WNL on check today 

## 2015-07-09 ENCOUNTER — Other Ambulatory Visit: Payer: Self-pay | Admitting: Physician Assistant

## 2015-07-22 LAB — HM DIABETES EYE EXAM

## 2015-07-25 ENCOUNTER — Encounter: Payer: Self-pay | Admitting: Family Medicine

## 2015-07-29 ENCOUNTER — Other Ambulatory Visit: Payer: Self-pay | Admitting: Family Medicine

## 2015-08-05 ENCOUNTER — Encounter: Payer: Self-pay | Admitting: Family Medicine

## 2015-08-05 DIAGNOSIS — G8929 Other chronic pain: Secondary | ICD-10-CM

## 2015-08-05 DIAGNOSIS — M62838 Other muscle spasm: Secondary | ICD-10-CM

## 2015-08-05 DIAGNOSIS — M542 Cervicalgia: Secondary | ICD-10-CM

## 2015-08-06 MED ORDER — CYCLOBENZAPRINE HCL 10 MG PO TABS: 10.0000 mg | ORAL_TABLET | Freq: Every day | ORAL | Status: DC

## 2015-08-06 MED ORDER — TRAMADOL HCL 50 MG PO TABS: 50.0000 mg | ORAL_TABLET | Freq: Two times a day (BID) | ORAL | Status: DC | PRN

## 2015-08-06 NOTE — Telephone Encounter (Signed)
Faxed hardcopy for Tramadol to [place]

## 2015-08-06 NOTE — Telephone Encounter (Signed)
Refilled patients tramadol and flexeril. Called patient to find out where he wanted this sent awaiting on patient to return call.

## 2015-08-13 ENCOUNTER — Other Ambulatory Visit: Payer: Self-pay | Admitting: Family Medicine

## 2015-08-15 ENCOUNTER — Encounter: Payer: Self-pay | Admitting: Cardiology

## 2015-08-15 ENCOUNTER — Encounter: Payer: Self-pay | Admitting: Family Medicine

## 2015-08-16 ENCOUNTER — Other Ambulatory Visit: Payer: Self-pay | Admitting: Family Medicine

## 2015-08-16 MED ORDER — EPINEPHRINE 0.3 MG/0.3ML IJ SOAJ: 0.3000 mg | Freq: Once | INTRAMUSCULAR | Status: DC

## 2015-08-22 ENCOUNTER — Other Ambulatory Visit: Payer: Self-pay | Admitting: Family Medicine

## 2015-10-27 ENCOUNTER — Other Ambulatory Visit: Payer: Self-pay | Admitting: Family Medicine

## 2015-12-02 ENCOUNTER — Encounter: Payer: Self-pay | Admitting: Family Medicine

## 2015-12-02 ENCOUNTER — Encounter: Payer: Self-pay | Admitting: Cardiology

## 2015-12-02 ENCOUNTER — Ambulatory Visit (INDEPENDENT_AMBULATORY_CARE_PROVIDER_SITE_OTHER): Admitting: Family Medicine

## 2015-12-02 VITALS — BP 122/78 | HR 69 | Temp 98.6°F | Ht 69.0 in | Wt 252.0 lb

## 2015-12-02 DIAGNOSIS — E118 Type 2 diabetes mellitus with unspecified complications: Secondary | ICD-10-CM | POA: Diagnosis not present

## 2015-12-02 DIAGNOSIS — Z9889 Other specified postprocedural states: Secondary | ICD-10-CM | POA: Diagnosis not present

## 2015-12-02 DIAGNOSIS — Z23 Encounter for immunization: Secondary | ICD-10-CM | POA: Diagnosis not present

## 2015-12-02 DIAGNOSIS — M109 Gout, unspecified: Secondary | ICD-10-CM

## 2015-12-02 DIAGNOSIS — M542 Cervicalgia: Secondary | ICD-10-CM

## 2015-12-02 DIAGNOSIS — K219 Gastro-esophageal reflux disease without esophagitis: Secondary | ICD-10-CM | POA: Diagnosis not present

## 2015-12-02 DIAGNOSIS — E559 Vitamin D deficiency, unspecified: Secondary | ICD-10-CM

## 2015-12-02 DIAGNOSIS — E782 Mixed hyperlipidemia: Secondary | ICD-10-CM

## 2015-12-02 DIAGNOSIS — Z0000 Encounter for general adult medical examination without abnormal findings: Secondary | ICD-10-CM

## 2015-12-02 DIAGNOSIS — I10 Essential (primary) hypertension: Secondary | ICD-10-CM

## 2015-12-02 DIAGNOSIS — D649 Anemia, unspecified: Secondary | ICD-10-CM

## 2015-12-02 DIAGNOSIS — M62838 Other muscle spasm: Secondary | ICD-10-CM

## 2015-12-02 DIAGNOSIS — Z9884 Bariatric surgery status: Secondary | ICD-10-CM

## 2015-12-02 DIAGNOSIS — G8929 Other chronic pain: Secondary | ICD-10-CM

## 2015-12-02 LAB — LIPID PANEL
Cholesterol: 163 mg/dL (ref 0–200)
HDL: 42.5 mg/dL (ref >39.00)
LDL Cholesterol: 93 mg/dL (ref 0–99)
NonHDL: 120.63
Total CHOL/HDL Ratio: 4
Triglycerides: 139 mg/dL (ref 0.0–149.0)
VLDL: 27.8 mg/dL (ref 0.0–40.0)

## 2015-12-02 LAB — COMPREHENSIVE METABOLIC PANEL
ALT: 24 U/L (ref 0–53)
AST: 34 U/L (ref 0–37)
Albumin: 4.4 g/dL (ref 3.5–5.2)
Alkaline Phosphatase: 38 U/L — ABNORMAL LOW (ref 39–117)
BUN: 12 mg/dL (ref 6–23)
CO2: 27 mEq/L (ref 19–32)
Calcium: 9.2 mg/dL (ref 8.4–10.5)
Chloride: 106 mEq/L (ref 96–112)
Creatinine, Ser: 0.93 mg/dL (ref 0.40–1.50)
GFR: 88.04 mL/min (ref >60.00)
Glucose, Bld: 156 mg/dL — ABNORMAL HIGH (ref 70–99)
Potassium: 4.4 mEq/L (ref 3.5–5.1)
Sodium: 139 mEq/L (ref 135–145)
Total Bilirubin: 0.4 mg/dL (ref 0.2–1.2)
Total Protein: 6.8 g/dL (ref 6.0–8.3)

## 2015-12-02 LAB — MICROALBUMIN / CREATININE URINE RATIO
Creatinine,U: 221.3 mg/dL
Microalb Creat Ratio: 0.5 mg/g (ref 0.0–30.0)
Microalb, Ur: 1.1 mg/dL (ref 0.0–1.9)

## 2015-12-02 LAB — CBC
HCT: 36.4 % — ABNORMAL LOW (ref 39.0–52.0)
Hemoglobin: 12 g/dL — ABNORMAL LOW (ref 13.0–17.0)
MCHC: 33 g/dL (ref 30.0–36.0)
MCV: 81.6 fl (ref 78.0–100.0)
Platelets: 239 10*3/uL (ref 150.0–400.0)
RBC: 4.46 Mil/uL (ref 4.22–5.81)
RDW: 13.9 % (ref 11.5–15.5)
WBC: 4.5 10*3/uL (ref 4.0–10.5)

## 2015-12-02 LAB — MAGNESIUM: Magnesium: 1.7 mg/dL (ref 1.5–2.5)

## 2015-12-02 LAB — TSH: TSH: 1.48 u[IU]/mL (ref 0.35–4.50)

## 2015-12-02 LAB — URIC ACID: Uric Acid, Serum: 6.4 mg/dL (ref 4.0–7.8)

## 2015-12-02 LAB — VITAMIN D 25 HYDROXY (VIT D DEFICIENCY, FRACTURES): VITD: 29.85 ng/mL — ABNORMAL LOW (ref 30.00–100.00)

## 2015-12-02 LAB — HEMOGLOBIN A1C: Hgb A1c MFr Bld: 6.7 % — ABNORMAL HIGH (ref 4.6–6.5)

## 2015-12-02 LAB — VITAMIN B12: Vitamin B-12: >1500 pg/mL — ABNORMAL HIGH (ref 211–911)

## 2015-12-02 MED ORDER — TRAMADOL HCL 50 MG PO TABS: 50.0000 mg | ORAL_TABLET | Freq: Two times a day (BID) | ORAL | 0 refills | Status: DC | PRN

## 2015-12-02 NOTE — Progress Notes (Addendum)
Patient ID: Brent HERNANDEZGARCI, male   DOB: Jan 11, 1956, 60 y.o.   MRN: [id number]   Subjective:    Patient ID: Brent [name], male    DOB: October 16, 1955, 60 y.o.   MRN: [id number]  Chief Complaint  Patient presents with  . Annual Exam    HPI Patient is in today for follow up on medical concerns such as Diabetes, obesity, neck pain, HTN, hyperlipidemia, arthritis, DDD and more. He is working with [place] on his level of disability at this time. Is follow ing with Dr Alvan Dame of ortho in town as well. Denies polyuria or polydipsia. Stopped Pravastatin. Is struggling with ongoing neck pain. Denies CP/palp/SOB/HA/congestion/fevers/GI or GU c/o. Taking meds as prescribed  Past Medical History:  Diagnosis Date  . Acid reflux disease   . ACID REFLUX DISEASE 07/05/2007  . Anemia   . Atherosclerosis   . Benign neoplasm of colon 07/05/2007  . Breast pain, left 11/17/2011  . Chest pain    a. Reportedly negative dobut echo performed prior to gastric bypass in 03/2011;  b. 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 12/2011 Cath: LM nl, LAD 50p, D1 101m, LCX min irregs, OM3 30, RCA 25p, 11m (FFR 0.99->0.89), PDA 30, EF 65%, Med Rx.  . COLONIC POLYPS, HX OF 10/29/2008  . Diarrhea 06/13/2010  . Diverticulosis of colon   . DIVERTICULOSIS, COLON 10/29/2008  . DM (diabetes mellitus), type 2, uncontrolled ([place])   . GI bleed   . Gout 03/04/2013  . Hearing loss 05/24/2013   Previous audiology evaluation completely.  [name] HTN (hypertension) 07/14/2010  . Hx of colonic polyps   . HYPERSOMNIA, ASSOCIATED WITH SLEEP APNEA 07/26/2008  . Hypertension 07/14/2010  . 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 Knee pain, left 10/10/2010  . Morbid obesity ([place]) 03/27/2010   a. 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  [name] Neck pain 03/2015  . Other and unspecified hyperlipidemia 11/16/2012  . Preventative health care 11/17/2011  . Renal stone 11/2013  . Sleep apnea    a. CPAP  . Tear of meniscus of left knee 2012    Past Surgical History:  Procedure  Laterality Date  . ANKLE SURGERY  1994  . CARDIAC CATHETERIZATION     denies any chest pain in the past 2 years  . COLONOSCOPY    . colonoscopy polyps    . ESOPHAGOGASTRODUODENOSCOPY N/A 03/27/2013   Procedure: ESOPHAGOGASTRODUODENOSCOPY (EGD);  Surgeon: Irene Shipper, MD;  Location: [facility];  Service: Endoscopy;  Laterality: N/A;  . GASTRIC BYPASS    . HERNIA REPAIR    . KNEE ARTHROSCOPY  11/06/10   Left, torn meniscus (repaired)  . LEFT HEART CATHETERIZATION WITH CORONARY ANGIOGRAM N/A 12/23/2011   Procedure: LEFT HEART CATHETERIZATION WITH CORONARY ANGIOGRAM;  Surgeon: Minus Breeding, MD;  Location: [facility] CATH LAB;  Service: Cardiovascular;  Laterality: N/A;  . right knee arthroscopy Right 07/05/14   Dr. Hart Robinsons, [place].  . TONSILLECTOMY  age 38  . UPPER GI ENDOSCOPY  03/27/13    Family History  Problem Relation Age of Onset  . Diabetes Mother   . Hypertension Mother   . Stroke Mother   . Hyperlipidemia Mother   . Hypertension Father   . Colon polyps Father   . Heart attack Father 44  . Stroke Father   . Heart attack Brother   . Diabetes Brother   . Heart disease Brother   . Heart attack Brother     Multiple  . Diabetes Brother   .  Other Brother     heart problems  . Heart disease Brother   . Diabetes Sister   . Obesity Brother   . Diabetes Brother   . Heart disease Brother   . Hypertension Maternal Grandmother   . ADD / ADHD Daughter   . Heart disease Brother   . Stomach cancer Neg Hx     Social History   Social History  . Marital status: Married    Spouse name: N/A  . Number of children: 2  . Years of education: N/A   Occupational History  . [employer]   Social History Main Topics  . Smoking status: Former Smoker    Packs/day: 1.50    Years: 20.00    Types: Cigarettes    Quit date: 04/21/1991  . Smokeless tobacco: Never Used  . Alcohol use 0.0 oz/week     Comment:  occasionaly- social  . Drug use: No  . Sexual activity:  Yes   Other Topics Concern  . Not on file   Social History Narrative   Lives with wife in [place].  Does not routinely exercise.    Outpatient Medications Prior to Visit  Medication Sig Dispense Refill  . aspirin EC 81 MG tablet Take 1 tablet (81 mg total) by mouth daily.    . B Complex Vitamins (VITAMIN-B COMPLEX) TABS Place 1 tablet under the tongue daily.    . Cholecalciferol (VITAMIN D-3) 5000 UNITS TABS Take 1 tablet by mouth daily.    . Cyanocobalamin (VITAMIN B-12) 1000 MCG SUBL Place 1 each under the tongue daily.    . cyclobenzaprine (FLEXERIL) 10 MG tablet Take 1 tablet (10 mg total) by mouth at bedtime. 30 tablet 1  . EPINEPHrine (EPIPEN 2-PAK) 0.3 mg/0.3 mL IJ SOAJ injection Inject 0.3 mLs (0.3 mg total) into the muscle once. 3 Device 1  . fenofibrate 160 MG tablet TAKE 1 TABLET DAILY 90 tablet 1  . glucose blood test strip Use as directed once daily to check blood sugar.  Diagnosis code E11.9 100 each 3  . Krill Oil 1000 MG CAPS Take 2 tablets by mouth daily.    . metFORMIN (GLUCOPHAGE) 500 MG tablet TAKE 1 TABLET FOUR TIMES A DAY 360 tablet 1  . metoprolol tartrate (LOPRESSOR) 25 MG tablet TAKE ONE-HALF (1/2) TABLET TWICE A DAY 90 tablet 1  . Multiple Vitamin (MULTIVITAMIN) tablet Take 2 tablets by mouth daily. BARIATRIC VIT    . pantoprazole (PROTONIX) 40 MG tablet TAKE 1 TABLET TWICE A DAY 180 tablet 1  . traMADol (ULTRAM) 50 MG tablet Take 1 tablet (50 mg total) by mouth every 12 (twelve) hours as needed. 30 tablet 0  . pravastatin (PRAVACHOL) 40 MG tablet      No facility-administered medications prior to visit.     Allergies  Allergen Reactions  . Bee Venom Anaphylaxis  . Lipitor [Atorvastatin]     Myalgias, memory changes  . Morphine     hyperactive  . Pravastatin Other (See Comments)    Review of Systems  Constitutional: Negative for chills, fever and malaise/fatigue.  HENT: Negative for congestion and hearing loss.   Eyes: Negative for blurred  vision and discharge.  Respiratory: Negative for cough, sputum production and shortness of breath.   Cardiovascular: Negative for chest pain, palpitations and leg swelling.  Gastrointestinal: Negative for abdominal pain, blood in stool, constipation, diarrhea, heartburn, nausea and vomiting.  Genitourinary: Negative for dysuria, frequency, hematuria and urgency.  Musculoskeletal: Positive for neck pain.  Negative for back pain, falls and myalgias.  Skin: Negative for rash.  Neurological: Negative for dizziness, sensory change, loss of consciousness, weakness and headaches.  Endo/Heme/Allergies: Negative for environmental allergies. Does not bruise/bleed easily.  Psychiatric/Behavioral: Negative for depression and suicidal ideas. The patient is not nervous/anxious and does not have insomnia.        Objective:    Physical Exam  Constitutional: He is oriented to person, place, and time. He appears well-developed and well-nourished. No distress.  HENT:  Head: Normocephalic and atraumatic.  Eyes: Conjunctivae are normal.  Neck: Neck supple. No thyromegaly present.  Cardiovascular: Normal rate, regular rhythm and normal heart sounds.   No murmur heard. Pulmonary/Chest: Effort normal and breath sounds normal. No respiratory distress. He has no wheezes.  Abdominal: Soft. Bowel sounds are normal. He exhibits no mass. There is no tenderness.  Musculoskeletal: He exhibits no edema.  Lymphadenopathy:    He has no cervical adenopathy.  Neurological: He is alert and oriented to person, place, and time.  Skin: Skin is warm and dry.  Psychiatric: He has a normal mood and affect. His behavior is normal.    BP 122/78 (BP Location: Left Arm, Patient Position: Sitting, Cuff Size: Large)   Pulse 69   Temp 98.6 F (37 C) (Oral)   Ht 5\' 9"  (1.753 m)   Wt 252 lb (114.3 kg)   SpO2 97%   BMI 37.21 kg/m  Wt Readings from Last 3 Encounters:  12/02/15 252 lb (114.3 kg)  05/27/15 252 lb 6 oz (114.5 kg)    04/02/15 256 lb 9.6 oz (116.4 kg)     Lab Results  Component Value Date   WBC 4.5 12/02/2015   HGB 12.0 (L) 12/02/2015   HCT 36.4 (L) 12/02/2015   PLT 239.0 12/02/2015   GLUCOSE 156 (H) 12/02/2015   CHOL 163 12/02/2015   TRIG 139.0 12/02/2015   HDL 42.50 12/02/2015   LDLDIRECT 110.3 02/11/2011   LDLCALC 93 12/02/2015   ALT 24 12/02/2015   AST 34 12/02/2015   NA 139 12/02/2015   K 4.4 12/02/2015   CL 106 12/02/2015   CREATININE 0.93 12/02/2015   BUN 12 12/02/2015   CO2 27 12/02/2015   TSH 1.48 12/02/2015   PSA 0.84 11/15/2014   INR 1.13 03/29/2013   HGBA1C 6.7 (H) 12/02/2015   MICROALBUR 1.1 12/02/2015    Lab Results  Component Value Date   TSH 1.48 12/02/2015   Lab Results  Component Value Date   WBC 4.5 12/02/2015   HGB 12.0 (L) 12/02/2015   HCT 36.4 (L) 12/02/2015   MCV 81.6 12/02/2015   PLT 239.0 12/02/2015   Lab Results  Component Value Date   NA 139 12/02/2015   K 4.4 12/02/2015   CO2 27 12/02/2015   GLUCOSE 156 (H) 12/02/2015   BUN 12 12/02/2015   CREATININE 0.93 12/02/2015   BILITOT 0.4 12/02/2015   ALKPHOS 38 (L) 12/02/2015   AST 34 12/02/2015   ALT 24 12/02/2015   PROT 6.8 12/02/2015   ALBUMIN 4.4 12/02/2015   CALCIUM 9.2 12/02/2015   ANIONGAP 14 12/03/2013   GFR 88.04 12/02/2015   Lab Results  Component Value Date   CHOL 163 12/02/2015   Lab Results  Component Value Date   HDL 42.50 12/02/2015   Lab Results  Component Value Date   LDLCALC 93 12/02/2015   Lab Results  Component Value Date   TRIG 139.0 12/02/2015   Lab Results  Component Value Date  CHOLHDL 4 12/02/2015   Lab Results  Component Value Date   HGBA1C 6.7 (H) 12/02/2015       Assessment & Plan:   Problem List Items Addressed This Visit    Morbid obesity ([place])    S/p gastric surgery. Labs reviewed. Encouraged DASH diet, decrease po intake and increase exercise as tolerated. Needs 7-8 hours of sleep nightly. Avoid trans fats, eat small, frequent meals  every 4-5 hours with lean proteins, complex carbs and healthy fats. Minimize simple carbs, GMO foods.      Diabetes type 2, controlled ([place])    hgba1c acceptable, minimize simple carbs. Increase exercise as tolerated. Continue current meds      Relevant Orders   Microalbumin / creatinine urine ratio (Completed)   Hemoglobin A1c (Completed)   HTN (hypertension)    Well controlled, no changes to meds. Encouraged heart healthy diet such as the DASH diet and exercise as tolerated.       Relevant Orders   TSH (Completed)   CBC (Completed)   Comprehensive metabolic panel (Completed)   Hyperlipidemia, mixed    Not taking Pravastatin, will try Livalo due to intolerance to Pravastatin and Atorvastatin      Relevant Orders   Lipid panel (Completed)   Gout   Relevant Orders   Uric acid (Completed)   H/O gastric bypass   Relevant Orders   Vitamin B12 (Completed)   Magnesium (Completed)   Anemia   Acid reflux - Primary    Avoid offending foods, start probiotics. Do not eat large meals in late evening and consider raising head of bed.       Avitaminosis D    Vitamin D historically low will check today      Relevant Orders   VITAMIN D 25 Hydroxy (Vit-D Deficiency, Fractures) (Completed)   Magnesium (Completed)   Preventative health care    Patient encouraged to maintain heart healthy diet, regular exercise, adequate sleep. Consider daily probiotics. Take medications as prescribed. Given and reviewed copy of ACP documents from [employer] and encouraged to complete and return. Labs ordered. Zostavax given      Trapezius muscle spasm   Relevant Medications   traMADol (ULTRAM) 50 MG tablet   Chronic neck pain    Encouraged moist heat and gentle stretching as tolerated. May try NSAIDs and prescription meds as directed and report if symptoms worsen or seek immediate care. Try topical treatments      Relevant Medications   traMADol (ULTRAM) 50 MG tablet    Other Visit  Diagnoses    Need for shingles vaccine       Relevant Orders   Varicella-zoster vaccine subcutaneous (Completed)      I have discontinued Mr. Trotter's pravastatin. I have also changed his traMADol. Additionally, I am having him maintain his multivitamin, Vitamin B-12, Vitamin D-3, Vitamin-B Complex, Krill Oil, glucose blood, aspirin EC, fenofibrate, cyclobenzaprine, metFORMIN, EPINEPHrine, pantoprazole, and metoprolol tartrate.  Meds ordered this encounter  Medications  . traMADol (ULTRAM) 50 MG tablet    Sig: Take 1 tablet (50 mg total) by mouth every 12 (twelve) hours as needed for moderate pain.    Dispense:  40 tablet    Refill:  0     [name], MD

## 2015-12-02 NOTE — Assessment & Plan Note (Signed)
Avoid offending foods, start probiotics. Do not eat large meals in late evening and consider raising head of bed.  

## 2015-12-02 NOTE — Assessment & Plan Note (Signed)
Vitamin D historically low will check today

## 2015-12-02 NOTE — Progress Notes (Signed)
Pre visit review using our clinic review tool, if applicable. No additional management support is needed unless otherwise documented below in the visit note. 

## 2015-12-02 NOTE — Patient Instructions (Signed)

## 2015-12-05 ENCOUNTER — Encounter: Payer: Self-pay | Admitting: Family Medicine

## 2015-12-06 ENCOUNTER — Other Ambulatory Visit: Payer: Self-pay | Admitting: Family Medicine

## 2015-12-06 MED ORDER — PITAVASTATIN CALCIUM 1 MG PO TABS: 1.0000 | ORAL_TABLET | Freq: Every day | ORAL | 1 refills | Status: DC

## 2015-12-15 NOTE — Assessment & Plan Note (Signed)
Encouraged moist heat and gentle stretching as tolerated. May try NSAIDs and prescription meds as directed and report if symptoms worsen or seek immediate care. Try topical treatments 

## 2015-12-15 NOTE — Assessment & Plan Note (Addendum)
Patient encouraged to maintain heart healthy diet, regular exercise, adequate sleep. Consider daily probiotics. Take medications as prescribed. Given and reviewed copy of ACP documents from [employer] and encouraged to complete and return. Labs ordered. Zostavax given

## 2015-12-15 NOTE — Assessment & Plan Note (Signed)
Not taking Pravastatin, will try Livalo due to intolerance to Pravastatin and Atorvastatin

## 2015-12-15 NOTE — Assessment & Plan Note (Signed)
hgba1c acceptable, minimize simple carbs. Increase exercise as tolerated. Continue current meds 

## 2015-12-15 NOTE — Assessment & Plan Note (Signed)
Well controlled, no changes to meds. Encouraged heart healthy diet such as the DASH diet and exercise as tolerated.  °

## 2015-12-15 NOTE — Assessment & Plan Note (Signed)
S/p gastric surgery. Labs reviewed. Encouraged DASH diet, decrease po intake and increase exercise as tolerated. Needs 7-8 hours of sleep nightly. Avoid trans fats, eat small, frequent meals every 4-5 hours with lean proteins, complex carbs and healthy fats. Minimize simple carbs, GMO foods.

## 2015-12-20 ENCOUNTER — Telehealth: Payer: Self-pay | Admitting: Family Medicine

## 2015-12-20 ENCOUNTER — Telehealth: Payer: Self-pay | Admitting: Cardiology

## 2015-12-20 NOTE — Telephone Encounter (Signed)
New message       Calling to see if we received a clearance for surgery form from Dr Alvan Dame?  Please call

## 2015-12-20 NOTE — Telephone Encounter (Signed)
I spoke with the pt and he faxed the clearance request into our office today.  I made the pt aware that it is not in Dr Claris Gladden record box at this time.  The pt will refax this form to [phone].

## 2015-12-20 NOTE — Telephone Encounter (Signed)
Pt dropped off document to be filled out and fax to tel # [phone] ([place]) Document put at front office tray.

## 2015-12-24 NOTE — Telephone Encounter (Signed)
Surgical clearance form received.    Surgery Date: 01/20/16 Procedure:  Right: TKA-Medial & Lateral W/Wo Patella Resurfacing  Last OV and Labs:  12/02/2015----CPE Last EKG:  02/21/15--Done by Dr. Aundra Dubin  Should we schedule patient for a surgical clearance appt or just bring patient in for EKG (nurse visit)?  Please advise.

## 2015-12-24 NOTE — Telephone Encounter (Signed)
He just had a visit and was doing well. If he still feels well just an EKG will work. thanks

## 2015-12-25 ENCOUNTER — Encounter: Payer: Self-pay | Admitting: Cardiology

## 2015-12-25 ENCOUNTER — Telehealth: Payer: Self-pay | Admitting: Family Medicine

## 2015-12-25 NOTE — Telephone Encounter (Signed)
FYI: Patient states that he also has to see his Cardiologist for clearance and he is not sure what test he will order for clearance. Patient is scheduled for 9/20 to get EKG

## 2015-12-25 NOTE — Telephone Encounter (Signed)
Nurse visit scheduled for 01/08/16 at 10:00 AM for EKG.

## 2015-12-25 NOTE — Telephone Encounter (Signed)
Attempted to reach patient to schedule nurse visit for EKG. Left message for patient to return call when available.

## 2015-12-26 ENCOUNTER — Encounter: Payer: Self-pay | Admitting: Cardiology

## 2015-12-26 ENCOUNTER — Encounter: Payer: Self-pay | Admitting: Family Medicine

## 2015-12-26 NOTE — Telephone Encounter (Signed)
Patient informed of PCP instructions and stated he would set up with cardiologist then.

## 2015-12-26 NOTE — Telephone Encounter (Signed)
If he is seeing cardiology, I am clear him medically pending cardiac clearance and they will be responsible for an EKG so he would not need to do one for [place]

## 2015-12-30 NOTE — Telephone Encounter (Signed)
Dr Aundra Dubin completed surgical clearance form:  [place] for surgery is no new cardiac symptoms.  LMTCB for pt to see if pt has any new cardiac symptoms prior to faxing completed form to Dr Alvan Dame.

## 2015-12-31 ENCOUNTER — Encounter: Payer: Self-pay | Admitting: Family Medicine

## 2015-12-31 NOTE — Telephone Encounter (Signed)
Pt states that he has not had any new cardiac symptoms, his BP has been good.  Pt advised I will forward this note to Dr Alvan Dame for cardiac clearance, paper cardiac clearance form completed by Dr Aundra Dubin has been returned to Medical Records to fax to Dr Alvan Dame.

## 2016-01-01 NOTE — Telephone Encounter (Signed)
Patient checking on my chart message confirming if EKG scheduled for 9/201/17 should be cancelled. Please advise

## 2016-01-07 NOTE — Patient Instructions (Signed)
Brent [name]  01/07/2016   Your procedure is scheduled on: 01/20/2016    Report to [facility] Main  Entrance take [place]  elevators to 3rd floor to  [place] at   0930 AM.  Call this number if you have problems the morning of surgery [phone]   Remember: ONLY 1 PERSON MAY GO WITH YOU TO SHORT STAY TO GET  READY MORNING OF [place].  Do not eat food or drink liquids :After Midnight.             Eat a good healthy snack prior to bedtime.      Take these medicines the morning of surgery with A SIP OF WATER: metoprolol ( Lopressor), Protonix  DO NOT TAKE ANY DIABETIC MEDICATIONS DAY OF YOUR SURGERY                               You may not have any metal on your body including hair pins and              piercings  Do not wear jewelry, , lotions, powders or perfumes, deodorant              Men may shave face and neck.   Do not bring valuables to the hospital. [place].  Contacts, dentures or bridgework may not be worn into surgery.  Leave suitcase in the car. After surgery it may be brought to your room.      Special Instructions: coughing and deep breathing exercises, leg exercises               Please read over the following fact sheets you were given: _____________________________________________________________________             [facility] - Preparing for Surgery Before surgery, you can play an important role.  Because skin is not sterile, your skin needs to be as free of germs as possible.  You can reduce the number of germs on your skin by washing with CHG (chlorahexidine gluconate) soap before surgery.  CHG is an antiseptic cleaner which kills germs and bonds with the skin to continue killing germs even after washing. Please DO NOT use if you have an allergy to CHG or antibacterial soaps.  If your skin becomes reddened/irritated stop using the CHG and inform your nurse when you  arrive at Short Stay. Do not shave (including legs and underarms) for at least 48 hours prior to the first CHG shower.  You may shave your face/neck. Please follow these instructions carefully:  1.  Shower with CHG Soap the night before surgery and the  morning of Surgery.  2.  If you choose to wash your hair, wash your hair first as usual with your  normal  shampoo.  3.  After you shampoo, rinse your hair and body thoroughly to remove the  shampoo.                           4.  Use CHG as you would any other liquid soap.  You can apply chg directly  to the skin and wash  Gently with a scrungie or clean washcloth.  5.  Apply the CHG Soap to your body ONLY FROM THE NECK DOWN.   Do not use on face/ open                           Wound or open sores. Avoid contact with eyes, ears mouth and genitals (private parts).                       Wash face,  Genitals (private parts) with your normal soap.             6.  Wash thoroughly, paying special attention to the area where your surgery  will be performed.  7.  Thoroughly rinse your body with warm water from the neck down.  8.  DO NOT shower/wash with your normal soap after using and rinsing off  the CHG Soap.                9.  Pat yourself dry with a clean towel.            10.  Wear clean pajamas.            11.  Place clean sheets on your bed the night of your first shower and do not  sleep with pets. Day of Surgery : Do not apply any lotions/deodorants the morning of surgery.  Please wear clean clothes to the hospital/surgery center.  FAILURE TO FOLLOW THESE INSTRUCTIONS MAY RESULT IN THE CANCELLATION OF YOUR SURGERY PATIENT SIGNATURE_________________________________  NURSE SIGNATURE__________________________________  ________________________________________________________________________  WHAT IS A BLOOD TRANSFUSION? Blood Transfusion Information  A transfusion is the replacement of blood or some of its parts. Blood  is made up of multiple cells which provide different functions.  Red blood cells carry oxygen and are used for blood loss replacement.  White blood cells fight against infection.  Platelets control bleeding.  Plasma helps clot blood.  Other blood products are available for specialized needs, such as hemophilia or other clotting disorders. BEFORE THE TRANSFUSION  Who gives blood for transfusions?   Healthy volunteers who are fully evaluated to make sure their blood is safe. This is blood bank blood. Transfusion therapy is the safest it has ever been in the practice of medicine. Before blood is taken from a donor, a complete history is taken to make sure that person has no history of diseases nor engages in risky social behavior (examples are intravenous drug use or sexual activity with multiple partners). The donor's travel history is screened to minimize risk of transmitting infections, such as malaria. The donated blood is tested for signs of infectious diseases, such as HIV and hepatitis. The blood is then tested to be sure it is compatible with you in order to minimize the chance of a transfusion reaction. If you or a relative donates blood, this is often done in anticipation of surgery and is not appropriate for emergency situations. It takes many days to process the donated blood. RISKS AND COMPLICATIONS Although transfusion therapy is very safe and saves many lives, the main dangers of transfusion include:   Getting an infectious disease.  Developing a transfusion reaction. This is an allergic reaction to something in the blood you were given. Every precaution is taken to prevent this. The decision to have a blood transfusion has been considered carefully by your caregiver before blood is given. Blood is not given unless the benefits outweigh  the risks. AFTER THE TRANSFUSION  Right after receiving a blood transfusion, you will usually feel much better and more energetic. This is  especially true if your red blood cells have gotten low (anemic). The transfusion raises the level of the red blood cells which carry oxygen, and this usually causes an energy increase.  The nurse administering the transfusion will monitor you carefully for complications. HOME CARE INSTRUCTIONS  No special instructions are needed after a transfusion. You may find your energy is better. Speak with your caregiver about any limitations on activity for underlying diseases you may have. SEEK MEDICAL CARE IF:   Your condition is not improving after your transfusion.  You develop redness or irritation at the intravenous (IV) site. SEEK IMMEDIATE MEDICAL CARE IF:  Any of the following symptoms occur over the next 12 hours:  Shaking chills.  You have a temperature by mouth above 102 F (38.9 C), not controlled by medicine.  Chest, back, or muscle pain.  People around you feel you are not acting correctly or are confused.  Shortness of breath or difficulty breathing.  Dizziness and fainting.  You get a rash or develop hives.  You have a decrease in urine output.  Your urine turns a dark color or changes to pink, red, or brown. Any of the following symptoms occur over the next 10 days:  You have a temperature by mouth above 102 F (38.9 C), not controlled by medicine.  Shortness of breath.  Weakness after normal activity.  The white part of the eye turns yellow (jaundice).  You have a decrease in the amount of urine or are urinating less often.  Your urine turns a dark color or changes to pink, red, or brown. Document Released: 04/03/2000 Document Revised: 06/29/2011 Document Reviewed: 11/21/2007 ExitCare Patient Information 2014 [place].  _______________________________________________________________________  Incentive Spirometer  An incentive spirometer is a tool that can help keep your lungs clear and active. This tool measures how well you are filling your lungs  with each breath. Taking long deep breaths may help reverse or decrease the chance of developing breathing (pulmonary) problems (especially infection) following:  A long period of time when you are unable to move or be active. BEFORE THE PROCEDURE   If the spirometer includes an indicator to show your best effort, your nurse or respiratory therapist will set it to a desired goal.  If possible, sit up straight or lean slightly forward. Try not to slouch.  Hold the incentive spirometer in an upright position. INSTRUCTIONS FOR USE  1. Sit on the edge of your bed if possible, or sit up as far as you can in bed or on a chair. 2. Hold the incentive spirometer in an upright position. 3. Breathe out normally. 4. Place the mouthpiece in your mouth and seal your lips tightly around it. 5. Breathe in slowly and as deeply as possible, raising the piston or the ball toward the top of the column. 6. Hold your breath for 3-5 seconds or for as long as possible. Allow the piston or ball to fall to the bottom of the column. 7. Remove the mouthpiece from your mouth and breathe out normally. 8. Rest for a few seconds and repeat Steps 1 through 7 at least 10 times every 1-2 hours when you are awake. Take your time and take a few normal breaths between deep breaths. 9. The spirometer may include an indicator to show your best effort. Use the indicator as a goal to work  toward during each repetition. 10. After each set of 10 deep breaths, practice coughing to be sure your lungs are clear. If you have an incision (the cut made at the time of surgery), support your incision when coughing by placing a pillow or rolled up towels firmly against it. Once you are able to get out of bed, walk around indoors and cough well. You may stop using the incentive spirometer when instructed by your caregiver.  RISKS AND COMPLICATIONS  Take your time so you do not get dizzy or light-headed.  If you are in pain, you may need to  take or ask for pain medication before doing incentive spirometry. It is harder to take a deep breath if you are having pain. AFTER USE  Rest and breathe slowly and easily.  It can be helpful to keep track of a log of your progress. Your caregiver can provide you with a simple table to help with this. If you are using the spirometer at home, follow these instructions: [place] IF:   You are having difficultly using the spirometer.  You have trouble using the spirometer as often as instructed.  Your pain medication is not giving enough relief while using the spirometer.  You develop fever of 100.5 F (38.1 C) or higher. SEEK IMMEDIATE MEDICAL CARE IF:   You cough up bloody sputum that had not been present before.  You develop fever of 102 F (38.9 C) or greater.  You develop worsening pain at or near the incision site. MAKE SURE YOU:   Understand these instructions.  Will watch your condition.  Will get help right away if you are not doing well or get worse. Document Released: 08/17/2006 Document Revised: 06/29/2011 Document Reviewed: 10/18/2006 [facility] Patient Information 2014 [place], [place].   ________________________________________________________________________

## 2016-01-08 ENCOUNTER — Ambulatory Visit (INDEPENDENT_AMBULATORY_CARE_PROVIDER_SITE_OTHER): Admitting: Internal Medicine

## 2016-01-08 ENCOUNTER — Encounter (HOSPITAL_COMMUNITY): Payer: Self-pay

## 2016-01-08 ENCOUNTER — Encounter (HOSPITAL_COMMUNITY)
Admission: RE | Admit: 2016-01-08 | Discharge: 2016-01-08 | Disposition: A | Discharge status: Home / Self Care | Payer: Non-veteran care | Source: Ambulatory Visit | Attending: Orthopedic Surgery | Admitting: Orthopedic Surgery

## 2016-01-08 DIAGNOSIS — Z01818 Encounter for other preprocedural examination: Secondary | ICD-10-CM | POA: Diagnosis present

## 2016-01-08 DIAGNOSIS — M1711 Unilateral primary osteoarthritis, right knee: Secondary | ICD-10-CM | POA: Insufficient documentation

## 2016-01-08 DIAGNOSIS — Z01812 Encounter for preprocedural laboratory examination: Secondary | ICD-10-CM | POA: Insufficient documentation

## 2016-01-08 DIAGNOSIS — Z01810 Encounter for preprocedural cardiovascular examination: Secondary | ICD-10-CM

## 2016-01-08 LAB — TYPE AND SCREEN
ABO/RH(D): A POS
Antibody Screen: NEGATIVE

## 2016-01-08 LAB — BASIC METABOLIC PANEL
Anion gap: 7 (ref 5–15)
BUN: 14 mg/dL (ref 6–20)
CO2: 27 mmol/L (ref 22–32)
Calcium: 9.6 mg/dL (ref 8.9–10.3)
Chloride: 105 mmol/L (ref 101–111)
Creatinine, Ser: 1.15 mg/dL (ref 0.61–1.24)
GFR calc Af Amer: >60 mL/min (ref >60)
GFR calc non Af Amer: >60 mL/min (ref >60)
Glucose, Bld: 169 mg/dL — ABNORMAL HIGH (ref 65–99)
Potassium: 4.9 mmol/L (ref 3.5–5.1)
Sodium: 139 mmol/L (ref 135–145)

## 2016-01-08 LAB — SURGICAL PCR SCREEN
MRSA, PCR: NEGATIVE
Staphylococcus aureus: NEGATIVE

## 2016-01-08 LAB — CBC
HCT: 38.4 % — ABNORMAL LOW (ref 39.0–52.0)
Hemoglobin: 12.2 g/dL — ABNORMAL LOW (ref 13.0–17.0)
MCH: 26.1 pg (ref 26.0–34.0)
MCHC: 31.8 g/dL (ref 30.0–36.0)
MCV: 82.2 fL (ref 78.0–100.0)
Platelets: 258 10*3/uL (ref 150–400)
RBC: 4.67 MIL/uL (ref 4.22–5.81)
RDW: 13.8 % (ref 11.5–15.5)
WBC: 4.6 10*3/uL (ref 4.0–10.5)

## 2016-01-08 NOTE — H&P (Signed)
TOTAL KNEE ADMISSION H&P  Patient is being admitted for right total knee arthroplasty.  Subjective:  Chief Complaint:    Right knee primary OA / pain  HPI: Brent [name], 60 y.o. male, has a history of pain and functional disability in the right knee due to arthritis and has failed non-surgical conservative treatments for greater than 12 weeks to includeNSAID's and/or analgesics, corticosteriod injections, viscosupplementation injections and activity modification.  Onset of symptoms was gradual, starting >10 years ago with gradually worsening course since that time. The patient noted prior procedures on the knee to include  arthroscopy and menisectomy on the right knee(s).  Patient currently rates pain in the right knee(s) at 6 out of 10 with activity. Patient has night pain, worsening of pain with activity and weight bearing, pain that interferes with activities of daily living, pain with passive range of motion, crepitus and joint swelling.  Patient has evidence of periarticular osteophytes and joint space narrowing by imaging studies.  There is no active infection.   Risks, benefits and expectations were discussed with the patient.  Risks including but not limited to the risk of anesthesia, blood clots, nerve damage, blood vessel damage, failure of the prosthesis, infection and up to and including death.  Patient understand the risks, benefits and expectations and wishes to proceed with surgery.   PCP: [name], MD  D/C Plans:      Home  Post-op Meds:       No Rx given  Tranexamic Acid:      To be given - IV   Decadron:      Is to be given  FYI:     Xarelto (unable to use ASA, gastric bypass)  Norco     Patient Active Problem List   Diagnosis Date Noted  . Trapezius muscle spasm 04/02/2015  . Chronic neck pain 04/02/2015  . Headache(784.0) 11/26/2013  . Nephrolithiasis 09/27/2013  . Tinea corporis 09/27/2013  . Preventative health care 05/24/2013  . Hearing loss 05/24/2013   . Anemia 03/28/2013  . H/O gastric bypass 03/23/2013  . Hx of adenomatous colonic polyps 03/23/2013  . Gout 03/04/2013  . Hyperlipidemia, mixed 11/16/2012  . OSA (obstructive sleep apnea) 04/07/2012  . CAD (coronary artery disease) 01/12/2012  . Avitaminosis D 10/14/2011  . Acid reflux 04/01/2011  . HTN (hypertension) 07/14/2010  . Morbid obesity ([place]) 03/27/2010  . DIVERTICULOSIS, COLON 10/29/2008  . Diabetes type 2, controlled ([place]) 07/05/2007   Past Medical History:  Diagnosis Date  . Acid reflux disease   . ACID REFLUX DISEASE 07/05/2007  . Anemia   . Atherosclerosis   . Benign neoplasm of colon 07/05/2007  . Breast pain, left 11/17/2011  . Chest pain    a. Reportedly negative dobut echo performed prior to gastric bypass in 03/2011;  b. 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 12/2011 Cath: LM nl, LAD 50p, D1 109m, LCX min irregs, OM3 30, RCA 25p, 37m (FFR 0.99->0.89), PDA 30, EF 65%, Med Rx.  . COLONIC POLYPS, HX OF 10/29/2008  . Diarrhea 06/13/2010  . Diverticulosis of colon   . DIVERTICULOSIS, COLON 10/29/2008  . DM (diabetes mellitus), type 2, uncontrolled ([place])   . GI bleed   . Gout 03/04/2013  . Hearing loss 05/24/2013   Previous audiology evaluation completely.  [name] HTN (hypertension) 07/14/2010  . Hx of colonic polyps   . HYPERSOMNIA, ASSOCIATED WITH SLEEP APNEA 07/26/2008  . Hypertension 07/14/2010  . 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 Knee  pain, left 10/10/2010  . Morbid obesity ([place]) 03/27/2010   a. 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  [name] Neck pain 03/2015  . Other and unspecified hyperlipidemia 11/16/2012  . Preventative health care 11/17/2011  . Renal stone 11/2013  . Sleep apnea    a. CPAP  . Tear of meniscus of left knee 2012    Past Surgical History:  Procedure Laterality Date  . ANKLE SURGERY  1994  . CARDIAC CATHETERIZATION     denies any chest pain in the past 2 years  . COLONOSCOPY    . colonoscopy polyps    . ESOPHAGOGASTRODUODENOSCOPY N/A 03/27/2013    Procedure: ESOPHAGOGASTRODUODENOSCOPY (EGD);  Surgeon: Irene Shipper, MD;  Location: [facility];  Service: Endoscopy;  Laterality: N/A;  . GASTRIC BYPASS    . HERNIA REPAIR    . KNEE ARTHROSCOPY  11/06/10   Left, torn meniscus (repaired)  . LEFT HEART CATHETERIZATION WITH CORONARY ANGIOGRAM N/A 12/23/2011   Procedure: LEFT HEART CATHETERIZATION WITH CORONARY ANGIOGRAM;  Surgeon: Minus Breeding, MD;  Location: [facility] CATH LAB;  Service: Cardiovascular;  Laterality: N/A;  . right knee arthroscopy Right 07/05/14   Dr. Hart Robinsons, [place].  . TONSILLECTOMY  age 47  . UPPER GI ENDOSCOPY  03/27/13    No prescriptions prior to admission.   Allergies  Allergen Reactions  . Bee Venom Anaphylaxis  . Lipitor [Atorvastatin]     Myalgias, memory changes  . Morphine     hyperactive  . Pravastatin Other (See Comments)    Made joints hurt. --also simvastatin     Social History  Substance Use Topics  . Smoking status: Former Smoker    Packs/day: 1.50    Years: 20.00    Types: Cigarettes    Quit date: 04/21/1991  . Smokeless tobacco: Never Used  . Alcohol use 0.0 oz/week     Comment:  occasionaly- social    Family History  Problem Relation Age of Onset  . Diabetes Mother   . Hypertension Mother   . Stroke Mother   . Hyperlipidemia Mother   . Hypertension Father   . Colon polyps Father   . Heart attack Father 48  . Stroke Father   . Heart attack Brother   . Diabetes Brother   . Heart disease Brother   . Heart attack Brother     Multiple  . Diabetes Brother   . Other Brother     heart problems  . Heart disease Brother   . Diabetes Sister   . Obesity Brother   . Diabetes Brother   . Heart disease Brother   . Hypertension Maternal Grandmother   . ADD / ADHD Daughter   . Heart disease Brother   . Stomach cancer Neg Hx      Review of Systems  Constitutional: Negative.   HENT: Negative.   Eyes: Negative.   Respiratory: Negative.   Cardiovascular: Negative.   Gastrointestinal:  Positive for heartburn.  Genitourinary: Negative.   Musculoskeletal: Positive for joint pain and neck pain.  Skin: Negative.   Neurological: Negative.   Endo/Heme/Allergies: Negative.   Psychiatric/Behavioral: Negative.     Objective:  Physical Exam  Constitutional: He is oriented to person, place, and time. He appears well-developed.  HENT:  Head: Normocephalic.  Eyes: Pupils are equal, round, and reactive to light.  Neck: Neck supple. No JVD present. No tracheal deviation present. No thyromegaly present.  Cardiovascular: Normal rate, regular rhythm, normal heart sounds and intact distal pulses.   Respiratory: Effort normal  and breath sounds normal. No respiratory distress. He has no wheezes.  GI: Soft. There is no tenderness. There is no guarding.  Musculoskeletal:       Right knee: He exhibits decreased range of motion, swelling and bony tenderness. He exhibits no ecchymosis, no deformity, no laceration and no erythema. Tenderness found.  Lymphadenopathy:    He has no cervical adenopathy.  Neurological: He is alert and oriented to person, place, and time. A sensory deficit (right UE, neck issues) is present.  Skin: Skin is warm and dry.  Psychiatric: He has a normal mood and affect.    Vital signs in last 24 hours: Temp:  [98.8 F (37.1 C)] 98.8 F (37.1 C) (09/20 0845) Pulse Rate:  [80] 80 (09/20 0845) Resp:  [16] 16 (09/20 0845) BP: (130)/(77) 130/77 (09/20 0845) SpO2:  [97 %] 97 % (09/20 0845) Weight:  [112.5 kg (248 lb)] 112.5 kg (248 lb) (09/20 0845)  Labs:   Estimated body mass index is 36.62 kg/m as calculated from the following:   Height as of 01/08/16: 5\' 9"  (1.753 m).   Weight as of 01/08/16: 112.5 kg (248 lb).   Imaging Review Plain radiographs demonstrate severe degenerative joint disease of the right knee(s).   The bone quality appears to be good for age and reported activity level.  Assessment/Plan:  End stage arthritis, right knee   The patient  history, physical examination, clinical judgment of the provider and imaging studies are consistent with end stage degenerative joint disease of the right knee(s) and total knee arthroplasty is deemed medically necessary. The treatment options including medical management, injection therapy arthroscopy and arthroplasty were discussed at length. The risks and benefits of total knee arthroplasty were presented and reviewed. The risks due to aseptic loosening, infection, stiffness, patella tracking problems, thromboembolic complications and other imponderables were discussed. The patient acknowledged the explanation, agreed to proceed with the plan and consent was signed. Patient is being admitted for inpatient treatment for surgery, pain control, PT, OT, prophylactic antibiotics, VTE prophylaxis, progressive ambulation and ADL's and discharge planning. The patient is planning to be discharged home.      Brent Pugh Logan Vegh   PA-C  01/08/2016, 9:06 PM

## 2016-01-08 NOTE — Pre-Procedure Instructions (Signed)
Hgb A1C done Aug. 2017- result 6.7- in EPIC

## 2016-01-09 NOTE — Progress Notes (Signed)
Here for a EKG, EKG was reviewed by PCP

## 2016-01-10 ENCOUNTER — Other Ambulatory Visit: Payer: Self-pay | Admitting: Family Medicine

## 2016-01-19 HISTORY — PX: REPLACEMENT TOTAL KNEE: SUR1224

## 2016-01-20 ENCOUNTER — Encounter (HOSPITAL_COMMUNITY)
Admission: RE | Disposition: A | Discharge status: Home Health | Payer: Self-pay | Source: Ambulatory Visit | Attending: Orthopedic Surgery

## 2016-01-20 ENCOUNTER — Inpatient Hospital Stay (HOSPITAL_COMMUNITY): Payer: Non-veteran care | Admitting: Registered Nurse

## 2016-01-20 ENCOUNTER — Inpatient Hospital Stay (HOSPITAL_COMMUNITY)
Admission: RE | Admit: 2016-01-20 | Discharge: 2016-01-21 | DRG: 470 | Disposition: A | Discharge status: Home Health | Payer: Non-veteran care | Source: Ambulatory Visit | Attending: Orthopedic Surgery | Admitting: Orthopedic Surgery

## 2016-01-20 ENCOUNTER — Encounter (HOSPITAL_COMMUNITY): Payer: Self-pay | Admitting: *Deleted

## 2016-01-20 DIAGNOSIS — Z885 Allergy status to narcotic agent status: Secondary | ICD-10-CM | POA: Diagnosis not present

## 2016-01-20 DIAGNOSIS — E669 Obesity, unspecified: Secondary | ICD-10-CM | POA: Diagnosis present

## 2016-01-20 DIAGNOSIS — G4733 Obstructive sleep apnea (adult) (pediatric): Secondary | ICD-10-CM | POA: Diagnosis present

## 2016-01-20 DIAGNOSIS — E119 Type 2 diabetes mellitus without complications: Secondary | ICD-10-CM | POA: Diagnosis present

## 2016-01-20 DIAGNOSIS — Z7982 Long term (current) use of aspirin: Secondary | ICD-10-CM

## 2016-01-20 DIAGNOSIS — Z87891 Personal history of nicotine dependence: Secondary | ICD-10-CM

## 2016-01-20 DIAGNOSIS — I10 Essential (primary) hypertension: Secondary | ICD-10-CM | POA: Diagnosis present

## 2016-01-20 DIAGNOSIS — Z96659 Presence of unspecified artificial knee joint: Secondary | ICD-10-CM

## 2016-01-20 DIAGNOSIS — Z96651 Presence of right artificial knee joint: Secondary | ICD-10-CM

## 2016-01-20 DIAGNOSIS — Z9884 Bariatric surgery status: Secondary | ICD-10-CM | POA: Diagnosis not present

## 2016-01-20 DIAGNOSIS — E782 Mixed hyperlipidemia: Secondary | ICD-10-CM | POA: Diagnosis present

## 2016-01-20 DIAGNOSIS — M1711 Unilateral primary osteoarthritis, right knee: Principal | ICD-10-CM | POA: Diagnosis present

## 2016-01-20 DIAGNOSIS — Z91030 Bee allergy status: Secondary | ICD-10-CM

## 2016-01-20 DIAGNOSIS — Z888 Allergy status to other drugs, medicaments and biological substances status: Secondary | ICD-10-CM | POA: Diagnosis not present

## 2016-01-20 DIAGNOSIS — I2510 Atherosclerotic heart disease of native coronary artery without angina pectoris: Secondary | ICD-10-CM | POA: Diagnosis present

## 2016-01-20 DIAGNOSIS — Z6836 Body mass index (BMI) 36.0-36.9, adult: Secondary | ICD-10-CM | POA: Diagnosis not present

## 2016-01-20 DIAGNOSIS — H9190 Unspecified hearing loss, unspecified ear: Secondary | ICD-10-CM | POA: Diagnosis present

## 2016-01-20 DIAGNOSIS — Z7984 Long term (current) use of oral hypoglycemic drugs: Secondary | ICD-10-CM | POA: Diagnosis not present

## 2016-01-20 DIAGNOSIS — M25561 Pain in right knee: Secondary | ICD-10-CM | POA: Diagnosis present

## 2016-01-20 DIAGNOSIS — K219 Gastro-esophageal reflux disease without esophagitis: Secondary | ICD-10-CM | POA: Diagnosis present

## 2016-01-20 HISTORY — PX: TOTAL KNEE ARTHROPLASTY: SHX125

## 2016-01-20 LAB — GLUCOSE, CAPILLARY
Glucose-Capillary: 135 mg/dL — ABNORMAL HIGH (ref 65–99)
Glucose-Capillary: 176 mg/dL — ABNORMAL HIGH (ref 65–99)
Glucose-Capillary: 207 mg/dL — ABNORMAL HIGH (ref 65–99)

## 2016-01-20 SURGERY — ARTHROPLASTY, KNEE, TOTAL
Anesthesia: Spinal | Site: Knee | Laterality: Right

## 2016-01-20 MED ORDER — MENTHOL 3 MG MT LOZG: 1.0000 | LOZENGE | OROMUCOSAL | Status: DC | PRN

## 2016-01-20 MED ORDER — KETOROLAC TROMETHAMINE 30 MG/ML IJ SOLN
INTRAMUSCULAR | Status: DC | PRN
  Administered 2016-01-20: 30 mg via INTRAVENOUS

## 2016-01-20 MED ORDER — FENTANYL CITRATE (PF) 100 MCG/2ML IJ SOLN
INTRAMUSCULAR | Status: DC | PRN
  Administered 2016-01-20: 100 ug via INTRAVENOUS

## 2016-01-20 MED ORDER — LIDOCAINE 2% (20 MG/ML) 5 ML SYRINGE
INTRAMUSCULAR | Status: DC | PRN
  Administered 2016-01-20: 40 mg via INTRAVENOUS

## 2016-01-20 MED ORDER — METOPROLOL TARTRATE 12.5 MG HALF TABLET
12.5000 mg | ORAL_TABLET | Freq: Two times a day (BID) | ORAL | Status: DC
  Administered 2016-01-20 – 2016-01-21 (×2): 12.5 mg via ORAL
  Filled 2016-01-20 (×2): qty 1

## 2016-01-20 MED ORDER — ONDANSETRON HCL 4 MG/2ML IJ SOLN
INTRAMUSCULAR | Status: DC | PRN
  Administered 2016-01-20: 4 mg via INTRAVENOUS

## 2016-01-20 MED ORDER — CHLORHEXIDINE GLUCONATE 4 % EX LIQD: 60.0000 mL | Freq: Once | CUTANEOUS | Status: DC

## 2016-01-20 MED ORDER — ALUM & MAG HYDROXIDE-SIMETH 200-200-20 MG/5ML PO SUSP: 30.0000 mL | ORAL | Status: DC | PRN

## 2016-01-20 MED ORDER — LIDOCAINE 2% (20 MG/ML) 5 ML SYRINGE
INTRAMUSCULAR | Status: AC
  Filled 2016-01-20: qty 5

## 2016-01-20 MED ORDER — MEPERIDINE HCL 50 MG/ML IJ SOLN: 6.2500 mg | INTRAMUSCULAR | Status: DC | PRN

## 2016-01-20 MED ORDER — HYDROMORPHONE HCL 1 MG/ML IJ SOLN
0.2500 mg | INTRAMUSCULAR | Status: DC | PRN
  Administered 2016-01-20 (×2): 0.5 mg via INTRAVENOUS

## 2016-01-20 MED ORDER — FERROUS SULFATE 325 (65 FE) MG PO TABS
325.0000 mg | ORAL_TABLET | Freq: Three times a day (TID) | ORAL | Status: DC
  Administered 2016-01-20 – 2016-01-21 (×3): 325 mg via ORAL
  Filled 2016-01-20 (×3): qty 1

## 2016-01-20 MED ORDER — PROPOFOL 10 MG/ML IV BOLUS
INTRAVENOUS | Status: DC | PRN
  Administered 2016-01-20 (×3): 20 mg via INTRAVENOUS

## 2016-01-20 MED ORDER — HYDROCODONE-ACETAMINOPHEN 7.5-325 MG PO TABS
1.0000 | ORAL_TABLET | ORAL | Status: DC
Start: 2016-01-20 — End: 2016-01-21
  Administered 2016-01-20: 2 via ORAL
  Administered 2016-01-20 – 2016-01-21 (×2): 1 via ORAL
  Administered 2016-01-21 (×4): 2 via ORAL
  Filled 2016-01-20: qty 1
  Filled 2016-01-20 (×2): qty 2
  Filled 2016-01-20: qty 1
  Filled 2016-01-20 (×3): qty 2

## 2016-01-20 MED ORDER — SODIUM CHLORIDE 0.9 % IV SOLN
INTRAVENOUS | Status: DC
  Administered 2016-01-20: 16:00:00 via INTRAVENOUS
  Administered 2016-01-21: 1000 mL via INTRAVENOUS

## 2016-01-20 MED ORDER — DEXAMETHASONE SODIUM PHOSPHATE 10 MG/ML IJ SOLN
10.0000 mg | Freq: Once | INTRAMUSCULAR | Status: AC
  Administered 2016-01-21: 10 mg via INTRAVENOUS
  Filled 2016-01-20: qty 1

## 2016-01-20 MED ORDER — LACTATED RINGERS IV SOLN
INTRAVENOUS | Status: DC
  Administered 2016-01-20 (×2): via INTRAVENOUS

## 2016-01-20 MED ORDER — BUPIVACAINE IN DEXTROSE 0.75-8.25 % IT SOLN
INTRATHECAL | Status: DC | PRN
  Administered 2016-01-20: 2 mL via INTRATHECAL

## 2016-01-20 MED ORDER — CEFAZOLIN SODIUM-DEXTROSE 2-4 GM/100ML-% IV SOLN
2.0000 g | INTRAVENOUS | Status: AC
  Administered 2016-01-20: 2 g via INTRAVENOUS

## 2016-01-20 MED ORDER — PROPOFOL 500 MG/50ML IV EMUL
INTRAVENOUS | Status: DC | PRN
  Administered 2016-01-20: 75 ug/kg/min via INTRAVENOUS

## 2016-01-20 MED ORDER — ONDANSETRON HCL 4 MG/2ML IJ SOLN: 4.0000 mg | Freq: Once | INTRAMUSCULAR | Status: DC | PRN

## 2016-01-20 MED ORDER — HYDROMORPHONE HCL 1 MG/ML IJ SOLN
INTRAMUSCULAR | Status: AC
  Filled 2016-01-20: qty 1

## 2016-01-20 MED ORDER — PROPOFOL 10 MG/ML IV BOLUS
INTRAVENOUS | Status: AC
  Filled 2016-01-20: qty 20

## 2016-01-20 MED ORDER — PHENOL 1.4 % MT LIQD
1.0000 | OROMUCOSAL | Status: DC | PRN
  Filled 2016-01-20: qty 177

## 2016-01-20 MED ORDER — ONDANSETRON HCL 4 MG PO TABS: 4.0000 mg | ORAL_TABLET | Freq: Four times a day (QID) | ORAL | Status: DC | PRN

## 2016-01-20 MED ORDER — DEXAMETHASONE SODIUM PHOSPHATE 10 MG/ML IJ SOLN
10.0000 mg | Freq: Once | INTRAMUSCULAR | Status: AC
Start: 2016-01-20 — End: 2016-01-20
  Administered 2016-01-20: 10 mg via INTRAVENOUS

## 2016-01-20 MED ORDER — METOCLOPRAMIDE HCL 5 MG/ML IJ SOLN
5.0000 mg | Freq: Three times a day (TID) | INTRAMUSCULAR | Status: DC | PRN
Start: 2016-01-20 — End: 2016-01-21

## 2016-01-20 MED ORDER — MIDAZOLAM HCL 2 MG/2ML IJ SOLN
INTRAMUSCULAR | Status: AC
  Filled 2016-01-20: qty 2

## 2016-01-20 MED ORDER — DEXAMETHASONE SODIUM PHOSPHATE 10 MG/ML IJ SOLN
INTRAMUSCULAR | Status: AC
  Filled 2016-01-20: qty 1

## 2016-01-20 MED ORDER — PROPOFOL 10 MG/ML IV BOLUS
INTRAVENOUS | Status: AC
  Filled 2016-01-20: qty 60

## 2016-01-20 MED ORDER — MIDAZOLAM HCL 5 MG/5ML IJ SOLN
INTRAMUSCULAR | Status: DC | PRN
  Administered 2016-01-20: 2 mg via INTRAVENOUS

## 2016-01-20 MED ORDER — ONDANSETRON HCL 4 MG/2ML IJ SOLN: 4.0000 mg | Freq: Four times a day (QID) | INTRAMUSCULAR | Status: DC | PRN

## 2016-01-20 MED ORDER — PRAVASTATIN SODIUM 20 MG PO TABS: 20.0000 mg | ORAL_TABLET | Freq: Every day | ORAL | Status: DC

## 2016-01-20 MED ORDER — ONDANSETRON HCL 4 MG/2ML IJ SOLN
INTRAMUSCULAR | Status: AC
  Filled 2016-01-20: qty 2

## 2016-01-20 MED ORDER — METHOCARBAMOL 1000 MG/10ML IJ SOLN
500.0000 mg | Freq: Four times a day (QID) | INTRAVENOUS | Status: DC | PRN
  Administered 2016-01-20: 500 mg via INTRAVENOUS
  Filled 2016-01-20: qty 550
  Filled 2016-01-20: qty 5

## 2016-01-20 MED ORDER — SODIUM CHLORIDE 0.9 % IJ SOLN
INTRAMUSCULAR | Status: AC
  Filled 2016-01-20: qty 50

## 2016-01-20 MED ORDER — METHOCARBAMOL 500 MG PO TABS
500.0000 mg | ORAL_TABLET | Freq: Four times a day (QID) | ORAL | Status: DC | PRN
  Administered 2016-01-20: 500 mg via ORAL
  Filled 2016-01-20: qty 1

## 2016-01-20 MED ORDER — DIPHENHYDRAMINE HCL 25 MG PO CAPS
25.0000 mg | ORAL_CAPSULE | Freq: Four times a day (QID) | ORAL | Status: DC | PRN
  Administered 2016-01-21 (×2): 25 mg via ORAL
  Filled 2016-01-20 (×2): qty 1

## 2016-01-20 MED ORDER — BUPIVACAINE-EPINEPHRINE (PF) 0.25% -1:200000 IJ SOLN
INTRAMUSCULAR | Status: DC | PRN
  Administered 2016-01-20: 30 mL via PERINEURAL

## 2016-01-20 MED ORDER — HYDROMORPHONE HCL 1 MG/ML IJ SOLN: 0.5000 mg | INTRAMUSCULAR | Status: DC | PRN

## 2016-01-20 MED ORDER — DOCUSATE SODIUM 100 MG PO CAPS
100.0000 mg | ORAL_CAPSULE | Freq: Two times a day (BID) | ORAL | Status: DC
  Administered 2016-01-21: 100 mg via ORAL
  Filled 2016-01-20: qty 1

## 2016-01-20 MED ORDER — SODIUM CHLORIDE 0.9 % IJ SOLN
INTRAMUSCULAR | Status: DC | PRN
  Administered 2016-01-20: 50 mL via INTRAVENOUS

## 2016-01-20 MED ORDER — BUPIVACAINE HCL (PF) 0.25 % IJ SOLN
INTRAMUSCULAR | Status: AC
  Filled 2016-01-20: qty 30

## 2016-01-20 MED ORDER — CEFAZOLIN SODIUM-DEXTROSE 2-4 GM/100ML-% IV SOLN
2.0000 g | Freq: Four times a day (QID) | INTRAVENOUS | Status: AC
  Administered 2016-01-20 – 2016-01-21 (×2): 2 g via INTRAVENOUS
  Filled 2016-01-20 (×2): qty 100

## 2016-01-20 MED ORDER — CELECOXIB 200 MG PO CAPS
200.0000 mg | ORAL_CAPSULE | Freq: Two times a day (BID) | ORAL | Status: DC
  Administered 2016-01-20: 200 mg via ORAL
  Filled 2016-01-20 (×2): qty 1

## 2016-01-20 MED ORDER — POLYETHYLENE GLYCOL 3350 17 G PO PACK
17.0000 g | PACK | Freq: Two times a day (BID) | ORAL | Status: DC
  Administered 2016-01-21: 17 g via ORAL
  Filled 2016-01-20: qty 1

## 2016-01-20 MED ORDER — CEFAZOLIN SODIUM-DEXTROSE 2-4 GM/100ML-% IV SOLN
INTRAVENOUS | Status: AC
  Filled 2016-01-20: qty 100

## 2016-01-20 MED ORDER — RIVAROXABAN 10 MG PO TABS
10.0000 mg | ORAL_TABLET | ORAL | Status: DC
  Administered 2016-01-21: 10 mg via ORAL
  Filled 2016-01-20: qty 1

## 2016-01-20 MED ORDER — MAGNESIUM CITRATE PO SOLN: 1.0000 | Freq: Once | ORAL | Status: DC | PRN

## 2016-01-20 MED ORDER — METFORMIN HCL 500 MG PO TABS
500.0000 mg | ORAL_TABLET | Freq: Three times a day (TID) | ORAL | Status: DC
  Administered 2016-01-20 – 2016-01-21 (×4): 500 mg via ORAL
  Filled 2016-01-20 (×4): qty 1

## 2016-01-20 MED ORDER — FENTANYL CITRATE (PF) 100 MCG/2ML IJ SOLN
INTRAMUSCULAR | Status: AC
Start: 2016-01-20 — End: 2016-01-20
  Filled 2016-01-20: qty 2

## 2016-01-20 MED ORDER — METOCLOPRAMIDE HCL 5 MG PO TABS: 5.0000 mg | ORAL_TABLET | Freq: Three times a day (TID) | ORAL | Status: DC | PRN

## 2016-01-20 MED ORDER — INSULIN ASPART 100 UNIT/ML ~~LOC~~ SOLN
0.0000 [IU] | Freq: Three times a day (TID) | SUBCUTANEOUS | Status: DC
  Administered 2016-01-20: 3 [IU] via SUBCUTANEOUS
  Administered 2016-01-21 (×2): 2 [IU] via SUBCUTANEOUS
  Filled 2016-01-20: qty 1

## 2016-01-20 MED ORDER — PANTOPRAZOLE SODIUM 40 MG PO TBEC
40.0000 mg | DELAYED_RELEASE_TABLET | Freq: Two times a day (BID) | ORAL | Status: DC
  Administered 2016-01-20 – 2016-01-21 (×2): 40 mg via ORAL
  Filled 2016-01-20 (×2): qty 1

## 2016-01-20 MED ORDER — TRANEXAMIC ACID 1000 MG/10ML IV SOLN
1000.0000 mg | INTRAVENOUS | Status: AC
  Administered 2016-01-20: 1000 mg via INTRAVENOUS
  Filled 2016-01-20: qty 10

## 2016-01-20 MED ORDER — KETOROLAC TROMETHAMINE 30 MG/ML IJ SOLN
INTRAMUSCULAR | Status: AC
  Filled 2016-01-20: qty 1

## 2016-01-20 MED ORDER — BISACODYL 10 MG RE SUPP: 10.0000 mg | Freq: Every day | RECTAL | Status: DC | PRN

## 2016-01-20 SURGICAL SUPPLY — 46 items
BAG DECANTER FOR FLEXI CONT (MISCELLANEOUS) IMPLANT
BAG ZIPLOCK 12X15 (MISCELLANEOUS) IMPLANT
BANDAGE ACE 6X5 VEL STRL LF (GAUZE/BANDAGES/DRESSINGS) ×2 IMPLANT
BLADE SAW SGTL 13.0X1.19X90.0M (BLADE) ×2 IMPLANT
BONE CEMENT GENTAMICIN (Cement) ×4 IMPLANT
BOWL SMART MIX CTS (DISPOSABLE) ×2 IMPLANT
CAPT KNEE TOTAL 3 ATTUNE ×2 IMPLANT
CEMENT BONE GENTAMICIN 40 (Cement) ×2 IMPLANT
CLOTH BEACON ORANGE TIMEOUT ST (SAFETY) ×2 IMPLANT
CUFF TOURN SGL QUICK 34 (TOURNIQUET CUFF) ×1
CUFF TRNQT CYL 34X4X40X1 (TOURNIQUET CUFF) ×1 IMPLANT
DECANTER SPIKE VIAL GLASS SM (MISCELLANEOUS) ×2 IMPLANT
DERMABOND ADVANCED (GAUZE/BANDAGES/DRESSINGS) ×1
DERMABOND ADVANCED .7 DNX12 (GAUZE/BANDAGES/DRESSINGS) ×1 IMPLANT
DRAPE U-SHAPE 47X51 STRL (DRAPES) ×2 IMPLANT
DRESSING AQUACEL AG SP 3.5X10 (GAUZE/BANDAGES/DRESSINGS) ×1 IMPLANT
DRSG AQUACEL AG SP 3.5X10 (GAUZE/BANDAGES/DRESSINGS) ×2
DURAPREP 26ML APPLICATOR (WOUND CARE) ×4 IMPLANT
ELECT REM PT RETURN 9FT ADLT (ELECTROSURGICAL) ×2
ELECTRODE REM PT RTRN 9FT ADLT (ELECTROSURGICAL) ×1 IMPLANT
GLOVE BIOGEL M 7.0 STRL (GLOVE) ×4 IMPLANT
GLOVE BIOGEL PI IND STRL 7.5 (GLOVE) ×4 IMPLANT
GLOVE BIOGEL PI IND STRL 8.5 (GLOVE) ×4 IMPLANT
GLOVE BIOGEL PI INDICATOR 7.5 (GLOVE) ×4
GLOVE BIOGEL PI INDICATOR 8.5 (GLOVE) ×4
GLOVE ECLIPSE 8.0 STRL XLNG CF (GLOVE) ×2 IMPLANT
GLOVE ORTHO TXT STRL SZ7.5 (GLOVE) ×4 IMPLANT
GOWN STRL REUS W/TWL LRG LVL3 (GOWN DISPOSABLE) ×2 IMPLANT
GOWN STRL REUS W/TWL XL LVL3 (GOWN DISPOSABLE) ×2 IMPLANT
HANDPIECE INTERPULSE COAX TIP (DISPOSABLE) ×1
LIQUID BAND (GAUZE/BANDAGES/DRESSINGS) ×2 IMPLANT
MANIFOLD NEPTUNE II (INSTRUMENTS) ×2 IMPLANT
PACK TOTAL KNEE CUSTOM (KITS) ×2 IMPLANT
POSITIONER SURGICAL ARM (MISCELLANEOUS) ×2 IMPLANT
SET HNDPC FAN SPRY TIP SCT (DISPOSABLE) ×1 IMPLANT
SET PAD KNEE POSITIONER (MISCELLANEOUS) ×2 IMPLANT
SUT MNCRL AB 4-0 PS2 18 (SUTURE) ×2 IMPLANT
SUT VIC AB 1 CT1 36 (SUTURE) ×2 IMPLANT
SUT VIC AB 2-0 CT1 27 (SUTURE) ×3
SUT VIC AB 2-0 CT1 TAPERPNT 27 (SUTURE) ×3 IMPLANT
SUT VLOC 180 0 24IN GS25 (SUTURE) ×2 IMPLANT
SYR 50ML LL SCALE MARK (SYRINGE) ×2 IMPLANT
TRAY FOLEY W/METER SILVER 16FR (SET/KITS/TRAYS/PACK) ×2 IMPLANT
WATER STERILE IRR 1500ML POUR (IV SOLUTION) ×2 IMPLANT
WRAP KNEE MAXI GEL POST OP (GAUZE/BANDAGES/DRESSINGS) ×2 IMPLANT
YANKAUER SUCT BULB TIP 10FT TU (MISCELLANEOUS) ×2 IMPLANT

## 2016-01-20 NOTE — Anesthesia Preprocedure Evaluation (Signed)
Anesthesia Evaluation  Patient identified by MRN, date of birth, ID band Patient awake    Reviewed: Allergy & Precautions, NPO status , Patient's Chart, lab work & pertinent test results  Airway Mallampati: I  TM Distance: >3 FB Neck ROM: Full    Dental   Pulmonary sleep apnea , former smoker,    Pulmonary exam normal        Cardiovascular hypertension, Pt. on medications Normal cardiovascular exam     Neuro/Psych    GI/Hepatic GERD  Medicated and Controlled,  Endo/Other  diabetes  Renal/GU      Musculoskeletal   Abdominal   Peds  Hematology   Anesthesia Other Findings   Reproductive/Obstetrics                             Anesthesia Physical Anesthesia Plan  ASA: III  Anesthesia Plan: Spinal   Post-op Pain Management:    Induction: Intravenous  Airway Management Planned: Simple Face Mask  Additional Equipment:   Intra-op Plan:   Post-operative Plan:   Informed Consent: I have reviewed the patients History and Physical, chart, labs and discussed the procedure including the risks, benefits and alternatives for the proposed anesthesia with the patient or authorized representative who has indicated his/her understanding and acceptance.     Plan Discussed with: CRNA and Surgeon  Anesthesia Plan Comments:         Anesthesia Quick Evaluation

## 2016-01-20 NOTE — Anesthesia Postprocedure Evaluation (Signed)
Anesthesia Post Note  Patient: Brent [name]  Procedure(s) Performed: Procedure(s) (LRB): RIGHT TOTAL KNEE ARTHROPLASTY (Right)  Patient location during evaluation: PACU Anesthesia Type: Spinal Level of consciousness: oriented and awake and alert Pain management: pain level controlled Vital Signs Assessment: post-procedure vital signs reviewed and stable Respiratory status: spontaneous breathing, respiratory function stable and patient connected to nasal cannula oxygen Cardiovascular status: blood pressure returned to baseline and stable Postop Assessment: no headache and no backache Anesthetic complications: no    Last Vitals:  Vitals:   01/20/16 1600 01/20/16 1606  BP:  128/73  Pulse: 74   Resp: 12 14  Temp:  36.9 C    Last Pain:  Vitals:   01/20/16 1612  TempSrc:   PainSc: 1                  Marcelino Campos DAVID

## 2016-01-20 NOTE — Transfer of Care (Signed)
Immediate Anesthesia Transfer of Care Note  Patient: Brent [name]  Procedure(s) Performed: Procedure(s): RIGHT TOTAL KNEE ARTHROPLASTY (Right)  Patient Location: PACU  Anesthesia Type:Regional  Level of Consciousness: awake and alert   Airway & Oxygen Therapy: Patient Spontanous Breathing and Patient connected to face mask oxygen  Post-op Assessment: Report given to RN and Post -op Vital signs reviewed and stable  Post vital signs: Reviewed and stable  Last Vitals:  Vitals:   01/20/16 1003  BP: 135/82  Pulse: 71  Resp: 18  Temp: 37.1 C    Last Pain:  Vitals:   01/20/16 1003  TempSrc: Oral  PainSc: 4          Complications: No apparent anesthesia complications

## 2016-01-20 NOTE — Op Note (Signed)
NAME:  Brent [name] [facility]                      MEDICAL RECORD NO.:  [id number]                             FACILITY:  [facility]      PHYSICIAN:  Pietro Cassis. Alvan Dame, M.D.  DATE OF BIRTH:  Apr 04, 1956      DATE OF PROCEDURE:  01/20/2016                                     OPERATIVE REPORT         PREOPERATIVE DIAGNOSIS:  Right knee osteoarthritis.      POSTOPERATIVE DIAGNOSIS:  Right knee osteoarthritis.      FINDINGS:  The patient was noted to have complete loss of cartilage and   bone-on-bone arthritis with associated osteophytes in the medial and patellofemoral compartments of   the knee with a significant synovitis and associated effusion.      PROCEDURE:  Right total knee replacement.      COMPONENTS USED:  DePuy Attune rotating platform posterior stabilized knee   system, a size 7 femur, 6 tibia, 6 PS AOX insert, and 38 anatomic patellar   button.      SURGEON:  Pietro Cassis. Alvan Dame, M.D.      ASSISTANT:  Molli Barrows, PA-C.      ANESTHESIA:  Spinal.      SPECIMENS:  None.      COMPLICATION:  None.      DRAINS:  None.  EBL: <100cc      TOURNIQUET TIME: 103mm at 250 mmHg      The patient was stable to the recovery room.      INDICATION FOR PROCEDURE:  Brent [name] is a 60 y.o. male patient of   mine.  The patient had been seen, evaluated, and treated conservatively in the   office with medication, activity modification, and injections.  The patient had   radiographic changes of bone-on-bone arthritis with endplate sclerosis and osteophytes noted.      The patient failed conservative measures including medication, injections, and activity modification, and at this point was ready for more definitive measures.   Based on the radiographic changes and failed conservative measures, the patient   decided to proceed with total knee replacement.  Risks of infection,   DVT, component failure, need for revision surgery, postop course, and   expectations were all   discussed and  reviewed.  Consent was obtained for benefit of pain   relief.      PROCEDURE IN DETAIL:  The patient was brought to the operative theater.   Once adequate anesthesia, preoperative antibiotics, 2 gm of Ancef, 1 gm of Tranexamic Acid, and 10 mg of Decadron administered, the patient was positioned supine with the right thigh tourniquet placed.  The  right lower extremity was prepped and draped in sterile fashion.  A time-   out was performed identifying the patient, planned procedure, and   extremity.      The right lower extremity was placed in the [facility] leg holder.  The leg was   exsanguinated, tourniquet elevated to 250 mmHg.  A midline incision was   made followed by median parapatellar arthrotomy.  Following initial   exposure, attention was first directed to the  patella.  Precut   measurement was noted to be 25 mm.  I resected down to 14 mm and used a   38 anatomic patellar button to restore patellar height as well as cover the cut   surface.      The lug holes were drilled and a metal shim was placed to protect the   patella from retractors and saw blades.      At this point, attention was now directed to the femur.  The femoral   canal was opened with a drill, irrigated to try to prevent fat emboli.  An   intramedullary rod was passed at 5 degrees valgus, 9 mm of bone was   resected off the distal femur.  Following this resection, the tibia was   subluxated anteriorly.  Using the extramedullary guide, 2 mm of bone was resected off   the proximal medial tibia.  We confirmed the gap would be   stable medially and laterally with a size 5 spacer block as well as confirmed   the cut was perpendicular in the coronal plane, checking with an alignment rod.      Once this was done, I sized the femur to be a size 7 in the anterior-   posterior dimension, chose a standard component based on medial and   lateral dimension.  The size 7 rotation block was then pinned in   position anterior  referenced using the C-clamp to set rotation.  The   anterior, posterior, and  chamfer cuts were made without difficulty nor   notching making certain that I was along the anterior cortex to help   with flexion gap stability.      The final box cut was made off the lateral aspect of distal femur.      At this point, the tibia was sized to be a size 6, the size 6 tray was   then pinned in position through the medial third of the tubercle,   drilled, and keel punched.  Trial reduction was now carried with a 7 femur,  6 tibia, a size 6 PS insert, and the 38 anatomic patella botton.  The knee was brought to   extension, full extension with good flexion stability with the patella   tracking through the trochlea without application of pressure.  Given   all these findings the femoral lugs were drilled and then the trial components removed.  Final components were   opened and cement was mixed.  The knee was irrigated with normal saline   solution and pulse lavage.  The synovial lining was   then injected with 30 cc of 0.25% Marcaine with epinephrine and 1 cc of Toradol plus 30 cc of NS for a total of 61 cc.      The knee was irrigated.  Final implants were then cemented onto clean and   dried cut surfaces of bone with the knee brought to extension with a size 6 trial insert.      Once the cement had fully cured, the excess cement was removed   throughout the knee.  I confirmed I was satisfied with the range of   motion and stability, and the final size 6 PS AOX insert was chosen.  It was   placed into the knee.      The tourniquet had been let down at 35 minutes.  No significant   hemostasis required.  The   extensor mechanism was then reapproximated using #1 Vicryl and #0  V-lock sutures with the knee   in flexion.  The   remaining wound was closed with 2-0 Vicryl and running 4-0 Monocryl.   The knee was cleaned, dried, dressed sterilely using Dermabond and   Aquacel dressing.  The patient  was then   brought to recovery room in stable condition, tolerating the procedure   well.   Please note that Physician Assistant, Molli Barrows, PA-C, was present for the entirety of the case, and was utilized for pre-operative positioning, peri-operative retractor management, general facilitation of the procedure.  He was also utilized for primary wound closure at the end of the case.              Pietro Cassis Alvan Dame, M.D.    01/20/2016 2:52 PM

## 2016-01-20 NOTE — Interval H&P Note (Signed)
History and Physical Interval Note:  01/20/2016 12:12 PM  Brent [name]  has presented today for surgery, with the diagnosis of RIGT KNEE OA  The various methods of treatment have been discussed with the patient and family. After consideration of risks, benefits and other options for treatment, the patient has consented to  Procedure(s): RIGHT TOTAL KNEE ARTHROPLASTY (Right) as a surgical intervention .  The patient's history has been reviewed, patient examined, no change in status, stable for surgery.  I have reviewed the patient's chart and labs.  Questions were answered to the patient's satisfaction.     Mauri Pole

## 2016-01-21 ENCOUNTER — Encounter (HOSPITAL_COMMUNITY): Payer: Self-pay | Admitting: Orthopedic Surgery

## 2016-01-21 DIAGNOSIS — E669 Obesity, unspecified: Secondary | ICD-10-CM | POA: Diagnosis present

## 2016-01-21 LAB — CBC
HCT: 31.1 % — ABNORMAL LOW (ref 39.0–52.0)
Hemoglobin: 10 g/dL — ABNORMAL LOW (ref 13.0–17.0)
MCH: 27.2 pg (ref 26.0–34.0)
MCHC: 32.2 g/dL (ref 30.0–36.0)
MCV: 84.7 fL (ref 78.0–100.0)
Platelets: 215 10*3/uL (ref 150–400)
RBC: 3.67 MIL/uL — ABNORMAL LOW (ref 4.22–5.81)
RDW: 14.2 % (ref 11.5–15.5)
WBC: 9.1 10*3/uL (ref 4.0–10.5)

## 2016-01-21 LAB — BASIC METABOLIC PANEL
Anion gap: 8 (ref 5–15)
BUN: 21 mg/dL — ABNORMAL HIGH (ref 6–20)
CO2: 23 mmol/L (ref 22–32)
Calcium: 8.4 mg/dL — ABNORMAL LOW (ref 8.9–10.3)
Chloride: 106 mmol/L (ref 101–111)
Creatinine, Ser: 1.04 mg/dL (ref 0.61–1.24)
GFR calc Af Amer: >60 mL/min (ref >60)
GFR calc non Af Amer: >60 mL/min (ref >60)
Glucose, Bld: 148 mg/dL — ABNORMAL HIGH (ref 65–99)
Potassium: 4.8 mmol/L (ref 3.5–5.1)
Sodium: 137 mmol/L (ref 135–145)

## 2016-01-21 LAB — GLUCOSE, CAPILLARY
Glucose-Capillary: 143 mg/dL — ABNORMAL HIGH (ref 65–99)
Glucose-Capillary: 137 mg/dL — ABNORMAL HIGH (ref 65–99)

## 2016-01-21 MED ORDER — HYDROCODONE-ACETAMINOPHEN 7.5-325 MG PO TABS: 1.0000 | ORAL_TABLET | ORAL | 0 refills | Status: DC | PRN

## 2016-01-21 MED ORDER — POLYETHYLENE GLYCOL 3350 17 G PO PACK: 17.0000 g | PACK | Freq: Two times a day (BID) | ORAL | 0 refills | Status: DC

## 2016-01-21 MED ORDER — METHOCARBAMOL 500 MG PO TABS: 500.0000 mg | ORAL_TABLET | Freq: Four times a day (QID) | ORAL | 0 refills | Status: DC | PRN

## 2016-01-21 MED ORDER — DOCUSATE SODIUM 100 MG PO CAPS: 100.0000 mg | ORAL_CAPSULE | Freq: Two times a day (BID) | ORAL | 0 refills | Status: DC

## 2016-01-21 MED ORDER — RIVAROXABAN 10 MG PO TABS: 10.0000 mg | ORAL_TABLET | ORAL | 0 refills | Status: DC

## 2016-01-21 NOTE — Evaluation (Signed)
Physical Therapy Evaluation Patient Details Name: Brent [name] MRN: [id number] DOB: [birth year] Today's Date: 01/21/2016   History of Present Illness  Pt is a 60 year old male s/p R TKA  Clinical Impression  Pt is s/p R TKA resulting in the deficits listed below (see PT Problem List).  Pt will benefit from skilled PT to increase their independence and safety with mobility to allow discharge to the venue listed below.  Pt mobilizing well POD#1 and plans to d/c home later this evening.     Follow Up Recommendations Home health PT (vs OPPT, per surgeon)    Equipment Recommendations  Rolling walker with 5" wheels    Recommendations for Other Services       Precautions / Restrictions Precautions Precautions: Knee Restrictions Other Position/Activity Restrictions: WBAT      Mobility  Bed Mobility               General bed mobility comments: pt up in recliner on arrival  Transfers Overall transfer level: Needs assistance Equipment used: Rolling walker (2 wheeled) Transfers: Sit to/from Stand Sit to Stand: Min guard         General transfer comment: demonstrates safe technique  Ambulation/Gait Ambulation/Gait assistance: Min guard Ambulation Distance (Feet): 160 Feet Assistive device: Rolling walker (2 wheeled) Gait Pattern/deviations: Step-through pattern;Decreased stance time - right;Antalgic     General Gait Details: verbal cues for sequencing, step length, RW positioning  Stairs            Wheelchair Mobility    Modified Rankin (Stroke Patients Only)       Balance                                             Pertinent Vitals/Pain Pain Assessment: 0-10 Pain Score: 3  Pain Location: R thigh, posterior knee Pain Descriptors / Indicators: Aching;Sore Pain Intervention(s): Limited activity within patient's tolerance;Monitored during session;Premedicated before session;Ice applied;Repositioned    Home Living Family/patient  expects to be discharged to:: Private residence Living Arrangements: Spouse/significant other Available Help at Discharge: Family Type of Home: House Home Access: Stairs to enter Entrance Stairs-Rails: Can reach both Entrance Stairs-Number of Steps: 2 Home Layout: Two level Home Equipment: Crutches;Cane - single point      Prior Function Level of Independence: Independent         Comments: teacher     Hand Dominance        Extremity/Trunk Assessment               Lower Extremity Assessment: RLE deficits/detail RLE Deficits / Details: able to perform SLR, knee AAROM flexion 95* sitting       Communication   Communication: No difficulties  Cognition Arousal/Alertness: Awake/alert Behavior During Therapy: WFL for tasks assessed/performed Overall Cognitive Status: Within Functional Limits for tasks assessed                      General Comments      Exercises Total Joint Exercises Ankle Circles/Pumps: AROM;Both;10 reps Quad Sets: AROM;Both;10 reps Short Arc Quad: AROM;Right;10 reps Heel Slides: AAROM;Right;Seated;10 reps Straight Leg Raises: AROM;Right;10 reps   Assessment/Plan    PT Assessment Patient needs continued PT services  PT Problem List Decreased strength;Decreased range of motion;Decreased mobility;Pain;Decreased knowledge of use of DME          PT Treatment Interventions DME instruction;Gait  training;Stair training;Functional mobility training;Therapeutic exercise;Therapeutic activities;Patient/family education    PT Goals (Current goals can be found in the Care Plan section)  Acute Rehab PT Goals Patient Stated Goal: return to work and driving in 3 weeks PT Goal Formulation: With patient Time For Goal Achievement: 01/24/16 Potential to Achieve Goals: Good    Frequency 7X/week   Barriers to discharge        Co-evaluation               End of Session Equipment Utilized During Treatment: Gait belt Activity  Tolerance: Patient tolerated treatment well Patient left: with call bell/phone within reach;in chair           Time: [id number] PT Time Calculation (min) (ACUTE ONLY): 18 min   Charges:   PT Evaluation $PT Eval Low Complexity: 1 Procedure     PT G Codes:        Nadia Viar,KATHrine E 01/21/2016, 9:58 AM Carmelia Bake, PT, DPT 01/21/2016 Pager: [phone]

## 2016-01-21 NOTE — Progress Notes (Signed)
Patient and his wife given discharge, medication, and follow up instructions, verbalized understanding, IV removed, DME and prescriptions with patient, family to transport home

## 2016-01-21 NOTE — Evaluation (Signed)
Occupational Therapy Evaluation Patient Details Name: Brent [name] MRN: [id number] DOB: 1955-12-23 Today's Date: 01/21/2016    History of Present Illness Pt is a 60 year old male s/p R TKA   Clinical Impression   Pt was admitted for the above sx. All education was completed. No further OT is needed at this time    Follow Up Recommendations  No OT follow up;Supervision/Assistance - 24 hour    Equipment Recommendations  3 in 1 bedside comode    Recommendations for Other Services       Precautions / Restrictions Precautions Precautions: Knee Restrictions Weight Bearing Restrictions: No Other Position/Activity Restrictions: WBAT      Mobility Bed Mobility               General bed mobility comments: mod I; HOB raised  Transfers Overall transfer level: Needs assistance Equipment used: Rolling walker (2 wheeled) Transfers: Sit to/from Stand Sit to Stand: Min guard;Supervision         General transfer comment: cues for UE placement    Balance                                            ADL Overall ADL's : Needs assistance/impaired             Lower Body Bathing: Minimal assistance;Sit to/from stand       Lower Body Dressing: Minimal assistance;Sit to/from stand   Toilet Transfer: Min guard;Ambulation;RW       Tub/ Shower Transfer: Walk-in shower;Min guard;Ambulation     General ADL Comments: pt will have assistance as needed.  His wife is a Marine scientist.  He can perform UB adls with set up.  Simulated shower ledge and practiced stepping over backwards and coming out forward.  Recommended use of 3:1 in shower, with wife wiping legs off after use to avoid rusting.  Reviewed precautions     Vision     Perception     Praxis      Pertinent Vitals/Pain Pain Assessment: 0-10 Pain Score: 2  Pain Location: R knee Pain Descriptors / Indicators: Sore Pain Intervention(s): Limited activity within patient's tolerance;Monitored  during session;Premedicated before session;Repositioned     Hand Dominance     Extremity/Trunk Assessment Upper Extremity Assessment Upper Extremity Assessment: Overall WFL for tasks assessed          Communication Communication Communication: No difficulties   Cognition Arousal/Alertness: Awake/alert Behavior During Therapy: WFL for tasks assessed/performed Overall Cognitive Status: Within Functional Limits for tasks assessed                     General Comments       Exercises       Shoulder Instructions      Home Living Family/patient expects to be discharged to:: Private residence Living Arrangements: Spouse/significant other Available Help at Discharge: Family Type of Home: House Home Access: Stairs to enter Technical brewer of Steps: 2 Entrance Stairs-Rails: Can reach both Home Layout: Two level Alternate Level Stairs-Number of Steps: 14 Alternate Level Stairs-Rails:  (has rail and wall or something to hold for entire steps) Bathroom Shower/Tub: Occupational psychologist: Standard     Home Equipment: Crutches;Cane - single point          Prior Functioning/Environment Level of Independence: Independent        Comments: Pharmacist, hospital  OT Problem List:     OT Treatment/Interventions:      OT Goals(Current goals can be found in the care plan section) Acute Rehab OT Goals Patient Stated Goal: return to work and driving in 3 weeks OT Goal Formulation: All assessment and education complete, DC therapy  OT Frequency:     Barriers to D/C:            Co-evaluation              End of Session    Activity Tolerance: Patient tolerated treatment well Patient left: in bed;with call bell/phone within reach   Time: 1127-1140 OT Time Calculation (min): 13 min Charges:  OT General Charges $OT Visit: 1 Procedure OT Evaluation $OT Eval Low Complexity: 1 Procedure G-Codes:    Gerell Fortson 02-03-16, 11:50  AM Lesle Chris, OTR/L [phone] 02/03/2016

## 2016-01-21 NOTE — Discharge Instructions (Signed)

## 2016-01-21 NOTE — Progress Notes (Signed)
[id number] Davis,BSN,RN3,CCM:[phone]/Rolling walker and 3 in 1 ordered through [facility] dme-jermaine/

## 2016-01-21 NOTE — Progress Notes (Signed)
Physical Therapy Treatment Patient Details Name: Brent [name] MRN: [id number] DOB: 1955-08-21 Today's Date: Feb 13, 2016    History of Present Illness Pt is a 60 year old male s/p R TKA    PT Comments    Pt ambulated in hallway and performed safe stair technique. Pt demonstrated increased gait speed and distance (increased distance per pt request). Pt reports feeling ready to be discharged home with plans to begin home health PT.  Follow Up Recommendations  Home health PT     Equipment Recommendations  Rolling walker with 5" wheels    Recommendations for Other Services       Precautions / Restrictions Precautions Precautions: Knee Restrictions Weight Bearing Restrictions: No Other Position/Activity Restrictions: WBAT    Mobility  Bed Mobility Overal bed mobility: Independent                Transfers Overall transfer level: Needs assistance Equipment used: Rolling walker (2 wheeled) Transfers: Sit to/from Stand Sit to Stand: Supervision         General transfer comment: supervision for safety  Ambulation/Gait Ambulation/Gait assistance: Supervision Ambulation Distance (Feet): 750 Feet Assistive device: Rolling walker (2 wheeled) Gait Pattern/deviations: Step-through pattern;Decreased stride length     General Gait Details: supervision for safety; verbal cues for step length   Stairs Stairs: Yes Stairs assistance: Supervision Stair Management: Two rails;Step to pattern;Forwards Number of Stairs: 10 General stair comments: supervision for safety; pt ascended/descended 3 smaller steps and 2 larger steps x 2 reps; verbal cues for LE sequencing on 1st rep; able to demonstrate safe stair technique on 2nd rep  Wheelchair Mobility    Modified Rankin (Stroke Patients Only)       Balance                                    Cognition Arousal/Alertness: Awake/alert Behavior During Therapy: WFL for tasks assessed/performed Overall  Cognitive Status: Within Functional Limits for tasks assessed                      Exercises Total Joint Exercises Goniometric ROM: 85 degrees active knee flexion sitting at EOB (no lack in knee extension)    General Comments        Pertinent Vitals/Pain Pain Assessment: 0-10 Pain Score: 3  Pain Location: R knee and thigh Pain Descriptors / Indicators: Sore;Discomfort Pain Intervention(s): Limited activity within patient's tolerance;Monitored during session;Repositioned;Ice applied    Home Living                      Prior Function            PT Goals (current goals can now be found in the care plan section) Progress towards PT goals: Progressing toward goals    Frequency    7X/week      PT Plan Current plan remains appropriate    Co-evaluation             End of Session Equipment Utilized During Treatment: Gait belt Activity Tolerance: Patient tolerated treatment well Patient left: in bed;with bed alarm set;with call bell/phone within reach     Time: [id number] PT Time Calculation (min) (ACUTE ONLY): 20 min  Charges:  $Gait Training: 8-22 mins                    G Codes:      Dewitt Hoes 02/13/2016,  3:39 PM Dewitt Hoes, SPT

## 2016-01-21 NOTE — Progress Notes (Signed)
     Subjective: 1 Day Post-Op Procedure(s) (LRB): RIGHT TOTAL KNEE ARTHROPLASTY (Right)   Seen by Dr. Alvan Dame. Patient reports pain as mild, pain controlled. No events throughout the night.  Ready to be discharged home, if he does well with PT.  Objective:   VITALS:   Vitals:   01/21/16 0200 01/21/16 0445  BP: (!) 104/52 (!) 99/58  Pulse: 65 61  Resp: 20 16  Temp: 98.7 F (37.1 C) 98.1 F (36.7 C)    Dorsiflexion/Plantar flexion intact Incision: dressing C/D/I No cellulitis present Compartment soft  LABS  Recent Labs  01/21/16 0442  HGB 10.0*  HCT 31.1*  WBC 9.1  PLT 215     Recent Labs  01/21/16 0442  NA 137  K 4.8  BUN 21*  CREATININE 1.04  GLUCOSE 148*     Assessment/Plan: 1 Day Post-Op Procedure(s) (LRB): RIGHT TOTAL KNEE ARTHROPLASTY (Right) Foley cath d/c'ed Advance diet Up with therapy D/C IV fluids Discharge home with home health  Follow up in 2 weeks at [facility]. Follow up with OLIN,Brynja Marker D in 2 weeks.  Contact information:  [facility] [address] [place] [id number]     Obese (BMI 30-39.9) Estimated body mass index is 36.62 kg/m as calculated from the following:   Height as of this encounter: 5\' 9"  (1.753 m).   Weight as of this encounter: 112.5 kg (248 lb). Patient also counseled that weight may inhibit the healing process Patient counseled that losing weight will help with future health issues        West Pugh. Cris Gibby   PAC  01/21/2016, 7:56 AM

## 2016-01-24 NOTE — Discharge Summary (Signed)
Physician Discharge Summary  Patient ID: Brent [name] MRN: [id number] DOB/AGE: 1955-11-20 60 y.o.  Admit date: 01/20/2016 Discharge date: 01/21/2016   Procedures:  Procedure(s) (LRB): RIGHT TOTAL KNEE ARTHROPLASTY (Right)  Attending Physician:  Dr. Paralee Cancel   Admission Diagnoses:   Right knee primary OA / pain  Discharge Diagnoses:  Principal Problem:   S/P right TKA Active Problems:   S/P knee replacement   Obese  Past Medical History:  Diagnosis Date  . Acid reflux disease   . ACID REFLUX DISEASE 07/05/2007  . Anemia   . Atherosclerosis   . Benign neoplasm of colon 07/05/2007  . Breast pain, left 11/17/2011  . Chest pain    a. Reportedly negative dobut echo performed prior to gastric bypass in 03/2011;  b. 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 12/2011 Cath: LM nl, LAD 50p, D1 43m, LCX min irregs, OM3 30, RCA 25p, 48m (FFR 0.99->0.89), PDA 30, EF 65%, Med Rx.  . COLONIC POLYPS, HX OF 10/29/2008  . Diarrhea 06/13/2010  . Diverticulosis of colon   . DIVERTICULOSIS, COLON 10/29/2008  . DM (diabetes mellitus), type 2, uncontrolled ([place])   . GI bleed   . Gout 03/04/2013  . Hearing loss 05/24/2013   Previous audiology evaluation completely.  [name] HTN (hypertension) 07/14/2010  . Hx of colonic polyps   . HYPERSOMNIA, ASSOCIATED WITH SLEEP APNEA 07/26/2008  . Hypertension 07/14/2010  . 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 Knee pain, left 10/10/2010  . Morbid obesity ([place]) 03/27/2010   a. 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  [name] Neck pain 03/2015  . Other and unspecified hyperlipidemia 11/16/2012  . Preventative health care 11/17/2011  . Renal stone 11/2013  . Sleep apnea    a. CPAP  . Tear of meniscus of left knee 2012    HPI:    Brent [name], 60 y.o. male, has a history of pain and functional disability in the right knee due to arthritis and has failed non-surgical conservative treatments for greater than 12 weeks to includeNSAID's and/or analgesics, corticosteriod injections,  viscosupplementation injections and activity modification.  Onset of symptoms was gradual, starting >10 years ago with gradually worsening course since that time. The patient noted prior procedures on the knee to include  arthroscopy and menisectomy on the right knee(s).  Patient currently rates pain in the right knee(s) at 6 out of 10 with activity. Patient has night pain, worsening of pain with activity and weight bearing, pain that interferes with activities of daily living, pain with passive range of motion, crepitus and joint swelling.  Patient has evidence of periarticular osteophytes and joint space narrowing by imaging studies.  There is no active infection.   Risks, benefits and expectations were discussed with the patient.  Risks including but not limited to the risk of anesthesia, blood clots, nerve damage, blood vessel damage, failure of the prosthesis, infection and up to and including death.  Patient understand the risks, benefits and expectations and wishes to proceed with surgery.  PCP: [name], MD   Discharged Condition: good  Hospital Course:  Patient underwent the above stated procedure on 01/20/2016. Patient tolerated the procedure well and brought to the recovery room in good condition and subsequently to the floor.  POD #1 BP: 99/58 ; Pulse: 61 ; Temp: 98.1 F (36.7 C) ; Resp: 16 Patient reports pain as mild, pain controlled. No events throughout the night.  Ready to be discharged home. Dorsiflexion/plantar flexion intact, incision: dressing C/D/I, no cellulitis  present and compartment soft.   LABS  Basename    HGB     10.0  HCT     31.1    Discharge Exam: General appearance: alert, cooperative and no distress Extremities: Homans sign is negative, no sign of DVT, no edema, redness or tenderness in the calves or thighs and no ulcers, gangrene or trophic changes  Disposition: Home with follow up in 2 weeks   Follow-up Information    Mauri Pole, MD. Schedule an  appointment as soon as possible for a visit in 2 week(s).   Specialty:  Orthopedic Surgery Contact information: [address] [place] [address] [id number]           Discharge Instructions    Call MD / Call 911    Complete by:  As directed    If you experience chest pain or shortness of breath, CALL 911 and be transported to the hospital emergency room.  If you develope a fever above 101 F, pus (white drainage) or increased drainage or redness at the wound, or calf pain, call your surgeon's office.   Change dressing    Complete by:  As directed    Maintain surgical dressing until follow up in the clinic. If the edges start to pull up, may reinforce with tape. If the dressing is no longer working, may remove and cover with gauze and tape, but must keep the area dry and clean.  Call with any questions or concerns.   Constipation Prevention    Complete by:  As directed    Drink plenty of fluids.  Prune juice may be helpful.  You may use a stool softener, such as Colace (over the counter) 100 mg twice a day.  Use MiraLax (over the counter) for constipation as needed.   Diet - low sodium heart healthy    Complete by:  As directed    Discharge instructions    Complete by:  As directed    Maintain surgical dressing until follow up in the clinic. If the edges start to pull up, may reinforce with tape. If the dressing is no longer working, may remove and cover with gauze and tape, but must keep the area dry and clean.  Follow up in 2 weeks at [facility]. Call with any questions or concerns.   Increase activity slowly as tolerated    Complete by:  As directed    Weight bearing as tolerated with assist device (walker, cane, etc) as directed, use it as long as suggested by your surgeon or therapist, typically at least 4-6 weeks.   TED hose    Complete by:  As directed    Use stockings (TED hose) for 2 weeks on both leg(s).  You may remove them at night for  sleeping.        Medication List    STOP taking these medications   acetaminophen 500 MG tablet Commonly known as:  TYLENOL   aspirin EC 81 MG tablet   cyclobenzaprine 10 MG tablet Commonly known as:  FLEXERIL   traMADol 50 MG tablet Commonly known as:  ULTRAM     TAKE these medications   docusate sodium 100 MG capsule Commonly known as:  COLACE Take 1 capsule (100 mg total) by mouth 2 (two) times daily.   EPINEPHrine 0.3 mg/0.3 mL Soaj injection Commonly known as:  EPIPEN 2-PAK Inject 0.3 mLs (0.3 mg total) into the muscle once.   fenofibrate 160 MG tablet TAKE 1 TABLET DAILY  glucose blood test strip Use as directed once daily to check blood sugar.  Diagnosis code E11.9   HYDROcodone-acetaminophen 7.5-325 MG tablet Commonly known as:  NORCO Take 1-2 tablets by mouth every 4 (four) hours as needed for moderate pain.   Krill Oil 1000 MG Caps Take 1 tablet by mouth 2 (two) times daily.   metFORMIN 500 MG tablet Commonly known as:  GLUCOPHAGE TAKE 1 TABLET FOUR TIMES A DAY   methocarbamol 500 MG tablet Commonly known as:  ROBAXIN Take 1 tablet (500 mg total) by mouth every 6 (six) hours as needed for muscle spasms.   metoprolol tartrate 25 MG tablet Commonly known as:  LOPRESSOR TAKE ONE-HALF (1/2) TABLET TWICE A DAY   multivitamin tablet Take 1 tablet by mouth 2 (two) times daily. BARIATRIC VIT   pantoprazole 40 MG tablet Commonly known as:  PROTONIX TAKE 1 TABLET TWICE A DAY   Pitavastatin Calcium 1 MG Tabs Commonly known as:  LIVALO Take 1 tablet (1 mg total) by mouth daily. Patient has failed atorvastatin, pravastatin, crestor and simvastatin. What changed:  when to take this  additional instructions   polyethylene glycol packet Commonly known as:  MIRALAX / GLYCOLAX Take 17 g by mouth 2 (two) times daily.   rivaroxaban 10 MG Tabs tablet Commonly known as:  XARELTO Take 1 tablet (10 mg total) by mouth daily. Take for 14 days and the  restart regular dose of aspirin.   Vitamin B-12 1000 MCG Subl Place 1 each under the tongue every morning.   Vitamin D-3 5000 UNITS Tabs Take 1 tablet by mouth every morning.   Vitamin-B Complex Tabs Place 1 tablet under the tongue every morning.        Signed: [place] Pugh. Joselynne Killam   PA-C  01/24/2016, 11:08 PM s

## 2016-02-07 ENCOUNTER — Encounter: Payer: Self-pay | Admitting: Internal Medicine

## 2016-05-25 ENCOUNTER — Encounter: Payer: Self-pay | Admitting: Internal Medicine

## 2016-05-25 ENCOUNTER — Ambulatory Visit (INDEPENDENT_AMBULATORY_CARE_PROVIDER_SITE_OTHER): Admitting: Family Medicine

## 2016-05-25 VITALS — BP 126/80 | HR 84 | Temp 98.2°F | Wt 243.0 lb

## 2016-05-25 DIAGNOSIS — D649 Anemia, unspecified: Secondary | ICD-10-CM

## 2016-05-25 DIAGNOSIS — Z0000 Encounter for general adult medical examination without abnormal findings: Secondary | ICD-10-CM

## 2016-05-25 DIAGNOSIS — E559 Vitamin D deficiency, unspecified: Secondary | ICD-10-CM | POA: Diagnosis not present

## 2016-05-25 DIAGNOSIS — I2510 Atherosclerotic heart disease of native coronary artery without angina pectoris: Secondary | ICD-10-CM | POA: Diagnosis not present

## 2016-05-25 DIAGNOSIS — M109 Gout, unspecified: Secondary | ICD-10-CM | POA: Diagnosis not present

## 2016-05-25 DIAGNOSIS — Z96651 Presence of right artificial knee joint: Secondary | ICD-10-CM

## 2016-05-25 DIAGNOSIS — I10 Essential (primary) hypertension: Secondary | ICD-10-CM | POA: Diagnosis not present

## 2016-05-25 DIAGNOSIS — Z9889 Other specified postprocedural states: Secondary | ICD-10-CM | POA: Diagnosis not present

## 2016-05-25 DIAGNOSIS — E118 Type 2 diabetes mellitus with unspecified complications: Secondary | ICD-10-CM

## 2016-05-25 DIAGNOSIS — E782 Mixed hyperlipidemia: Secondary | ICD-10-CM

## 2016-05-25 DIAGNOSIS — Z9884 Bariatric surgery status: Secondary | ICD-10-CM

## 2016-05-25 LAB — CBC
HCT: 36.4 % — ABNORMAL LOW (ref 39.0–52.0)
Hemoglobin: 12 g/dL — ABNORMAL LOW (ref 13.0–17.0)
MCHC: 33 g/dL (ref 30.0–36.0)
MCV: 81.3 fl (ref 78.0–100.0)
Platelets: 271 10*3/uL (ref 150.0–400.0)
RBC: 4.48 Mil/uL (ref 4.22–5.81)
RDW: 14.4 % (ref 11.5–15.5)
WBC: 5.4 10*3/uL (ref 4.0–10.5)

## 2016-05-25 LAB — COMPREHENSIVE METABOLIC PANEL
ALT: 16 U/L (ref 0–53)
AST: 21 U/L (ref 0–37)
Albumin: 4.6 g/dL (ref 3.5–5.2)
Alkaline Phosphatase: 38 U/L — ABNORMAL LOW (ref 39–117)
BUN: 13 mg/dL (ref 6–23)
CO2: 29 mEq/L (ref 19–32)
Calcium: 9.6 mg/dL (ref 8.4–10.5)
Chloride: 105 mEq/L (ref 96–112)
Creatinine, Ser: 0.9 mg/dL (ref 0.40–1.50)
GFR: 91.29 mL/min (ref >60.00)
Glucose, Bld: 130 mg/dL — ABNORMAL HIGH (ref 70–99)
Potassium: 4.8 mEq/L (ref 3.5–5.1)
Sodium: 139 mEq/L (ref 135–145)
Total Bilirubin: 0.6 mg/dL (ref 0.2–1.2)
Total Protein: 7 g/dL (ref 6.0–8.3)

## 2016-05-25 LAB — VITAMIN B12: Vitamin B-12: >1500 pg/mL — ABNORMAL HIGH (ref 211–911)

## 2016-05-25 LAB — HEMOGLOBIN A1C: Hgb A1c MFr Bld: 6.5 % (ref 4.6–6.5)

## 2016-05-25 LAB — LIPID PANEL
Cholesterol: 134 mg/dL (ref 0–200)
HDL: 51.5 mg/dL (ref >39.00)
LDL Cholesterol: 57 mg/dL (ref 0–99)
NonHDL: 82.78
Total CHOL/HDL Ratio: 3
Triglycerides: 131 mg/dL (ref 0.0–149.0)
VLDL: 26.2 mg/dL (ref 0.0–40.0)

## 2016-05-25 LAB — VITAMIN D 25 HYDROXY (VIT D DEFICIENCY, FRACTURES): VITD: 38.44 ng/mL (ref 30.00–100.00)

## 2016-05-25 LAB — TSH: TSH: 3.63 u[IU]/mL (ref 0.35–4.50)

## 2016-05-25 LAB — URIC ACID: Uric Acid, Serum: 5.6 mg/dL (ref 4.0–7.8)

## 2016-05-25 LAB — MAGNESIUM: Magnesium: 1.5 mg/dL (ref 1.5–2.5)

## 2016-05-25 NOTE — Progress Notes (Signed)
Patient ID: Brent [name], male   DOB: Jul 21, 1955, 61 y.o.   MRN: [id number]   Subjective:    Patient ID: Brent [name], male    DOB: [birth year], 61 y.o.   MRN: [id number]  Chief Complaint  Patient presents with  . Annual Exam  . Follow-up  . Hypertension  . Diabetes   I acted as a Education administrator for Dr. Charlett Blake. [name], RMA  Hypertension  This is a chronic problem. The current episode started more than 1 year ago. The problem is unchanged. Pertinent negatives include no anxiety, blurred vision, chest pain, headaches, malaise/fatigue, palpitations or shortness of breath. Risk factors for coronary artery disease include diabetes mellitus.  Diabetes  He presents for his follow-up diabetic visit. He has type 2 diabetes mellitus. Pertinent negatives for hypoglycemia include no headaches. Pertinent negatives for diabetes include no blurred vision and no chest pain.    Patient is in today for annual exam and follow up on numerous medical conditions including those listed. He denies any polyuria or polydipsia. No recent febrile illness or recent hospitalization. Sugars have been well controlled. His biggest concern is ongoing pain. He struggles with knee pain, neck pain, back pain, ankle pain. No recent injury or trauma. Patient with ongoing degenerative joint concerns. He ahs struggled with joint pain and debility since 1990 to some degree. 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 He then began having trouble with arthritic changes in his knees and ultimately underwent TKR.  Past Medical History:  Diagnosis Date  . Acid reflux disease   . ACID REFLUX DISEASE 07/05/2007  . Anemia   . Atherosclerosis   . Benign neoplasm of colon 07/05/2007  . Breast pain, left 11/17/2011  . Chest pain    a. Reportedly negative dobut echo performed prior to gastric bypass in 03/2011;  b. CTA 12/2011 Mod Mid RCA  stenosis;  c. 12/2011 Cath: LM nl, LAD 50p, D1 20m, LCX min irregs, OM3 30, RCA 25p, 59m (FFR 0.99->0.89), PDA 30, EF 65%, Med Rx.  . COLONIC POLYPS, HX OF 10/29/2008  . Diarrhea 06/13/2010  . Diverticulosis of colon   . DIVERTICULOSIS, COLON 10/29/2008  . DM (diabetes mellitus), type 2, uncontrolled ([place])   . GI bleed   . Gout 03/04/2013  . Hearing loss 05/24/2013   Previous audiology evaluation completely.  [name] HTN (hypertension) 07/14/2010  . Hx of colonic polyps   . HYPERSOMNIA, ASSOCIATED WITH SLEEP APNEA 07/26/2008  . Hypertension 07/14/2010  . 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 Knee pain, left 10/10/2010  . Morbid obesity ([place]) 03/27/2010   a. 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  [name] Neck pain 03/2015  . Other and unspecified hyperlipidemia 11/16/2012  . Preventative health care 11/17/2011  . Renal stone 11/2013  . Sleep apnea    a. CPAP  . Tear of meniscus of left knee 2012    Past Surgical History:  Procedure Laterality Date  . ANKLE SURGERY  1994  . CARDIAC CATHETERIZATION     denies any chest pain in the past 2 years  . COLONOSCOPY    . colonoscopy polyps    . ESOPHAGOGASTRODUODENOSCOPY N/A 03/27/2013   Procedure: ESOPHAGOGASTRODUODENOSCOPY (EGD);  Surgeon: Irene Shipper, MD;  Location: [facility];  Service: Endoscopy;  Laterality: N/A;  . GASTRIC BYPASS    . HERNIA REPAIR    . KNEE ARTHROSCOPY  11/06/10   Left, torn meniscus (  repaired)  . LEFT HEART CATHETERIZATION WITH CORONARY ANGIOGRAM N/A 12/23/2011   Procedure: LEFT HEART CATHETERIZATION WITH CORONARY ANGIOGRAM;  Surgeon: Minus Breeding, MD;  Location: [facility] CATH LAB;  Service: Cardiovascular;  Laterality: N/A;  . right knee arthroscopy Right 07/05/14   Dr. Hart Robinsons, [place].  . TONSILLECTOMY  age 82  . TOTAL KNEE ARTHROPLASTY Right 01/20/2016   Procedure: RIGHT TOTAL KNEE ARTHROPLASTY;  Surgeon: Paralee Cancel, MD;  Location: [facility] ORS;  Service: Orthopedics;  Laterality: Right;  . UPPER GI ENDOSCOPY  03/27/13    Family  History  Problem Relation Age of Onset  . Diabetes Mother   . Hypertension Mother   . Stroke Mother   . Hyperlipidemia Mother   . Hypertension Father   . Colon polyps Father   . Heart attack Father 18  . Stroke Father   . Heart attack Brother   . Diabetes Brother   . Heart disease Brother   . Heart attack Brother     Multiple  . Diabetes Brother   . Other Brother     heart problems  . Heart disease Brother   . Diabetes Sister   . Obesity Brother   . Diabetes Brother   . Heart disease Brother   . Hypertension Maternal Grandmother   . ADD / ADHD Daughter   . Heart disease Brother   . Stomach cancer Neg Hx     Social History   Social History  . Marital status: Married    Spouse name: N/A  . Number of children: 2  . Years of education: N/A   Occupational History  . [employer]   Social History Main Topics  . Smoking status: Former Smoker    Packs/day: 1.50    Years: 20.00    Types: Cigarettes    Quit date: 04/21/1991  . Smokeless tobacco: Never Used  . Alcohol use 0.0 oz/week     Comment:  occasionaly- social  . Drug use: No  . Sexual activity: Yes   Other Topics Concern  . Not on file   Social History Narrative   Lives with wife in [place].  Does not routinely exercise.    Outpatient Medications Prior to Visit  Medication Sig Dispense Refill  . B Complex Vitamins (VITAMIN-B COMPLEX) TABS Place 1 tablet under the tongue every morning.     . Cholecalciferol (VITAMIN D-3) 5000 UNITS TABS Take 1 tablet by mouth every morning.     . Cyanocobalamin (VITAMIN B-12) 1000 MCG SUBL Place 1 each under the tongue every morning.     . docusate sodium (COLACE) 100 MG capsule Take 1 capsule (100 mg total) by mouth 2 (two) times daily. 10 capsule 0  . EPINEPHrine (EPIPEN 2-PAK) 0.3 mg/0.3 mL IJ SOAJ injection Inject 0.3 mLs (0.3 mg total) into the muscle once. 3 Device 1  . fenofibrate 160 MG tablet TAKE 1 TABLET DAILY 90 tablet 1  . glucose blood test  strip Use as directed once daily to check blood sugar.  Diagnosis code E11.9 100 each 3  . HYDROcodone-acetaminophen (NORCO) 7.5-325 MG tablet Take 1-2 tablets by mouth every 4 (four) hours as needed for moderate pain. 80 tablet 0  . Krill Oil 1000 MG CAPS Take 1 tablet by mouth 2 (two) times daily.     . metFORMIN (GLUCOPHAGE) 500 MG tablet TAKE 1 TABLET FOUR TIMES A DAY 360 tablet 1  . methocarbamol (ROBAXIN) 500 MG tablet Take 1 tablet (500 mg total) by mouth  every 6 (six) hours as needed for muscle spasms. 40 tablet 0  . metoprolol tartrate (LOPRESSOR) 25 MG tablet TAKE ONE-HALF (1/2) TABLET TWICE A DAY 90 tablet 1  . Multiple Vitamin (MULTIVITAMIN) tablet Take 1 tablet by mouth 2 (two) times daily. BARIATRIC VIT    . pantoprazole (PROTONIX) 40 MG tablet TAKE 1 TABLET TWICE A DAY 180 tablet 1  . Pitavastatin Calcium (LIVALO) 1 MG TABS Take 1 tablet (1 mg total) by mouth daily. Patient has failed atorvastatin, pravastatin, crestor and simvastatin. (Patient taking differently: Take 1 tablet by mouth every evening. Patient has failed atorvastatin, pravastatin, crestor and simvastatin.) 90 tablet 1  . polyethylene glycol (MIRALAX / GLYCOLAX) packet Take 17 g by mouth 2 (two) times daily. 14 each 0  . rivaroxaban (XARELTO) 10 MG TABS tablet Take 1 tablet (10 mg total) by mouth daily. Take for 14 days and the restart regular dose of aspirin. 14 tablet 0   No facility-administered medications prior to visit.     Allergies  Allergen Reactions  . Bee Venom Anaphylaxis  . Lipitor [Atorvastatin]     Myalgias, memory changes  . Morphine     hyperactive  . Pravastatin Other (See Comments)    Made joints hurt. --also simvastatin     Review of Systems  Constitutional: Negative for fever and malaise/fatigue.  HENT: Negative for congestion.   Eyes: Negative for blurred vision.  Respiratory: Negative for cough and shortness of breath.   Cardiovascular: Negative for chest pain, palpitations and leg  swelling.  Gastrointestinal: Negative for vomiting.  Musculoskeletal: Positive for joint pain. Negative for back pain.  Skin: Negative for rash.  Neurological: Negative for loss of consciousness and headaches.       Objective:    Physical Exam  Constitutional: He is oriented to person, place, and time. He appears well-developed and well-nourished. No distress.  HENT:  Head: Normocephalic and atraumatic.  Eyes: Conjunctivae are normal.  Neck: Normal range of motion. No thyromegaly present.  Cardiovascular: Normal rate and regular rhythm.   Pulmonary/Chest: Effort normal and breath sounds normal. He has no wheezes.  Abdominal: Soft. Bowel sounds are normal. He exhibits no mass. There is no tenderness. There is no rebound and no guarding.  Musculoskeletal: Normal range of motion. He exhibits no edema or deformity.  Neurological: He is alert and oriented to person, place, and time.  Skin: Skin is warm and dry. He is not diaphoretic.  Psychiatric: He has a normal mood and affect.    BP 126/80 (BP Location: Left Arm, Patient Position: Sitting, Cuff Size: Normal)   Pulse 84   Temp 98.2 F (36.8 C) (Oral)   Wt 243 lb (110.2 kg)   SpO2 97%   BMI 35.88 kg/m  Wt Readings from Last 3 Encounters:  05/25/16 243 lb (110.2 kg)  01/20/16 248 lb (112.5 kg)  01/08/16 248 lb (112.5 kg)     Lab Results  Component Value Date   WBC 5.4 05/25/2016   HGB 12.0 (L) 05/25/2016   HCT 36.4 (L) 05/25/2016   PLT 271.0 05/25/2016   GLUCOSE 130 (H) 05/25/2016   CHOL 134 05/25/2016   TRIG 131.0 05/25/2016   HDL 51.50 05/25/2016   LDLDIRECT 110.3 02/11/2011   LDLCALC 57 05/25/2016   ALT 16 05/25/2016   AST 21 05/25/2016   NA 139 05/25/2016   K 4.8 05/25/2016   CL 105 05/25/2016   CREATININE 0.90 05/25/2016   BUN 13 05/25/2016   CO2 29 05/25/2016  TSH 3.63 05/25/2016   PSA 0.84 11/15/2014   INR 1.13 03/29/2013   HGBA1C 6.5 05/25/2016   MICROALBUR 1.1 12/02/2015    Lab Results    Component Value Date   TSH 3.63 05/25/2016   Lab Results  Component Value Date   WBC 5.4 05/25/2016   HGB 12.0 (L) 05/25/2016   HCT 36.4 (L) 05/25/2016   MCV 81.3 05/25/2016   PLT 271.0 05/25/2016   Lab Results  Component Value Date   NA 139 05/25/2016   K 4.8 05/25/2016   CO2 29 05/25/2016   GLUCOSE 130 (H) 05/25/2016   BUN 13 05/25/2016   CREATININE 0.90 05/25/2016   BILITOT 0.6 05/25/2016   ALKPHOS 38 (L) 05/25/2016   AST 21 05/25/2016   ALT 16 05/25/2016   PROT 7.0 05/25/2016   ALBUMIN 4.6 05/25/2016   CALCIUM 9.6 05/25/2016   ANIONGAP 8 01/21/2016   GFR 91.29 05/25/2016   Lab Results  Component Value Date   CHOL 134 05/25/2016   Lab Results  Component Value Date   HDL 51.50 05/25/2016   Lab Results  Component Value Date   LDLCALC 57 05/25/2016   Lab Results  Component Value Date   TRIG 131.0 05/25/2016   Lab Results  Component Value Date   CHOLHDL 3 05/25/2016   Lab Results  Component Value Date   HGBA1C 6.5 05/25/2016       Assessment & Plan:   Problem List Items Addressed This Visit    Morbid obesity ([place])    Encouraged DASH diet, decrease po intake and increase exercise as tolerated. Needs 7-8 hours of sleep nightly. Avoid trans fats, eat small, frequent meals every 4-5 hours with lean proteins, complex carbs and healthy fats. Minimize simple carbs. Has a history of gastric bypass, encouraged to consider bariatric consultation for further management       Diabetes type 2, controlled ([place]) - Primary    hgba1c acceptable, minimize simple carbs. Increase exercise as tolerated. Continue current meds      Relevant Orders   Comprehensive metabolic panel (Completed)   Hemoglobin A1c (Completed)   HTN (hypertension)    Well controlled, no changes to meds. Encouraged heart healthy diet such as the DASH diet and exercise as tolerated.       Relevant Orders   CBC (Completed)   TSH (Completed)   CAD (coronary artery disease)   Hyperlipidemia,  mixed    Tolerating statin, encouraged heart healthy diet, avoid trans fats, minimize simple carbs and saturated fats. Increase exercise as tolerated      Relevant Orders   Lipid panel (Completed)   Gout   Relevant Orders   Uric acid (Completed)   H/O gastric bypass   Relevant Orders   Vitamin B12 (Completed)   Magnesium (Completed)   Anemia   Avitaminosis D   Relevant Orders   VITAMIN D 25 Hydroxy (Vit-D Deficiency, Fractures) (Completed)   Preventative health care    Patient encouraged to maintain heart healthy diet, regular exercise, adequate sleep. Consider daily probiotics. Take medications as prescribed      Relevant Orders   CBC (Completed)   Comprehensive metabolic panel (Completed)   S/P right TKA    Patient with ongoing degenerative joint concerns. He ahs struggled with joint pain and debility since 1990 to some degree. 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 He then began having trouble with arthritic  changes in his knees. He is acitvely engaging with the [employer] a this time to determine his service connection and has asked for a statement from this office. Will support his claim as injury to one joint in the lower extremities often puts excessive strain on other joints.         I am having Mr. Alire maintain his multivitamin, Vitamin B-12, Vitamin D-3, Vitamin-B Complex, Krill Oil, glucose blood, EPINEPHrine, pantoprazole, metoprolol tartrate, Pitavastatin Calcium, fenofibrate, metFORMIN, docusate sodium, HYDROcodone-acetaminophen, methocarbamol, polyethylene glycol, rivaroxaban, and traMADol.  Meds ordered this encounter  Medications  . traMADol (ULTRAM) 50 MG tablet    Sig: Take 1 tablet by mouth 2 (two) times daily.    CMA served as Education administrator during this visit. History, Physical and Plan performed by medical provider. Documentation and orders reviewed and attested to.   [name], MD

## 2016-05-25 NOTE — Progress Notes (Signed)
Pre visit review using our clinic review tool, if applicable. No additional management support is needed unless otherwise documented below in the visit note. 

## 2016-05-25 NOTE — Patient Instructions (Signed)
Preventive Care 40-64 Years, Male Preventive care refers to lifestyle choices and visits with your health care provider that can promote health and wellness. What does preventive care include?  A yearly physical exam. This is also called an annual well check.  Dental exams once or twice a year.  Routine eye exams. Ask your health care provider how often you should have your eyes checked.  Personal lifestyle choices, including:  Daily care of your teeth and gums.  Regular physical activity.  Eating a healthy diet.  Avoiding tobacco and drug use.  Limiting alcohol use.  Practicing safe sex.  Taking low-dose aspirin every day starting at age 50. What happens during an annual well check? The services and screenings done by your health care provider during your annual well check will depend on your age, overall health, lifestyle risk factors, and family history of disease. Counseling  Your health care provider may ask you questions about your:  Alcohol use.  Tobacco use.  Drug use.  Emotional well-being.  Home and relationship well-being.  Sexual activity.  Eating habits.  Work and work environment. Screening  You may have the following tests or measurements:  Height, weight, and BMI.  Blood pressure.  Lipid and cholesterol levels. These may be checked every 5 years, or more frequently if you are over 50 years old.  Skin check.  Lung cancer screening. You may have this screening every year starting at age 55 if you have a 30-pack-year history of smoking and currently smoke or have quit within the past 15 years.  Fecal occult blood test (FOBT) of the stool. You may have this test every year starting at age 50.  Flexible sigmoidoscopy or colonoscopy. You may have a sigmoidoscopy every 5 years or a colonoscopy every 10 years starting at age 50.  Prostate cancer screening. Recommendations will vary depending on your family history and other risks.  Hepatitis C  blood test.  Hepatitis B blood test.  Sexually transmitted disease (STD) testing.  Diabetes screening. This is done by checking your blood sugar (glucose) after you have not eaten for a while (fasting). You may have this done every 1-3 years. Discuss your test results, treatment options, and if necessary, the need for more tests with your health care provider. Vaccines  Your health care provider may recommend certain vaccines, such as:  Influenza vaccine. This is recommended every year.  Tetanus, diphtheria, and acellular pertussis (Tdap, Td) vaccine. You may need a Td booster every 10 years.  Varicella vaccine. You may need this if you have not been vaccinated.  Zoster vaccine. You may need this after age 60.  Measles, mumps, and rubella (MMR) vaccine. You may need at least one dose of MMR if you were born in 1957 or later. You may also need a second dose.  Pneumococcal 13-valent conjugate (PCV13) vaccine. You may need this if you have certain conditions and have not been vaccinated.  Pneumococcal polysaccharide (PPSV23) vaccine. You may need one or two doses if you smoke cigarettes or if you have certain conditions.  Meningococcal vaccine. You may need this if you have certain conditions.  Hepatitis A vaccine. You may need this if you have certain conditions or if you travel or work in places where you may be exposed to hepatitis A.  Hepatitis B vaccine. You may need this if you have certain conditions or if you travel or work in places where you may be exposed to hepatitis B.  Haemophilus influenzae type b (Hib)   vaccine. You may need this if you have certain risk factors. Talk to your health care provider about which screenings and vaccines you need and how often you need them. This information is not intended to replace advice given to you by your health care provider. Make sure you discuss any questions you have with your health care provider. Document Released: 05/03/2015  Document Revised: 12/25/2015 Document Reviewed: 02/05/2015 Elsevier Interactive Patient Education  2017 [employer].

## 2016-05-31 NOTE — Assessment & Plan Note (Signed)
Encouraged DASH diet, decrease po intake and increase exercise as tolerated. Needs 7-8 hours of sleep nightly. Avoid trans fats, eat small, frequent meals every 4-5 hours with lean proteins, complex carbs and healthy fats. Minimize simple carbs. Has a history of gastric bypass, encouraged to consider bariatric consultation for further management

## 2016-05-31 NOTE — Assessment & Plan Note (Signed)
Well controlled, no changes to meds. Encouraged heart healthy diet such as the DASH diet and exercise as tolerated.  °

## 2016-05-31 NOTE — Assessment & Plan Note (Signed)
Patient with ongoing degenerative joint concerns. He ahs struggled with joint pain and debility since 1990 to some degree. 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 He is acitvely engaging with the [facility] a this time to determine his service connection and has asked for a statement from this office. Will support his claim as injury to one joint in the lower extremities often puts excessive strain on other joints.

## 2016-05-31 NOTE — Assessment & Plan Note (Signed)
Tolerating statin, encouraged heart healthy diet, avoid trans fats, minimize simple carbs and saturated fats. Increase exercise as tolerated 

## 2016-05-31 NOTE — Assessment & Plan Note (Signed)
hgba1c acceptable, minimize simple carbs. Increase exercise as tolerated. Continue current meds 

## 2016-05-31 NOTE — Assessment & Plan Note (Signed)
Patient encouraged to maintain heart healthy diet, regular exercise, adequate sleep. Consider daily probiotics. Take medications as prescribed 

## 2016-06-04 ENCOUNTER — Ambulatory Visit: Admitting: Family Medicine

## 2016-06-11 ENCOUNTER — Telehealth: Payer: Self-pay | Admitting: Family Medicine

## 2016-06-11 NOTE — Telephone Encounter (Signed)
Pt's spouse dropped off copies of medical records from [employer] for provider to have on file, and pt states would like a few copies back (pt put in a letter indicating what he needs back). Pt's tel [phone]. Documents are in two big yellow envelopes. Copies put at front office tray.

## 2016-06-12 ENCOUNTER — Encounter: Payer: Self-pay | Admitting: Family Medicine

## 2016-06-17 ENCOUNTER — Other Ambulatory Visit: Payer: Self-pay | Admitting: Family Medicine

## 2016-06-25 ENCOUNTER — Telehealth: Payer: Self-pay

## 2016-06-25 NOTE — Telephone Encounter (Signed)
Called patient to let him know his records are ready for pick up.   Records left in red folder on CMA desk.  PC

## 2016-07-13 ENCOUNTER — Telehealth: Payer: Self-pay

## 2016-07-13 ENCOUNTER — Ambulatory Visit (AMBULATORY_SURGERY_CENTER): Payer: Self-pay | Admitting: *Deleted

## 2016-07-13 ENCOUNTER — Encounter: Payer: Self-pay | Admitting: Family Medicine

## 2016-07-13 VITALS — Ht 68.0 in | Wt 246.0 lb

## 2016-07-13 DIAGNOSIS — Z86010 Personal history of colonic polyps: Secondary | ICD-10-CM

## 2016-07-13 MED ORDER — NA SULFATE-K SULFATE-MG SULF 17.5-3.13-1.6 GM/177ML PO SOLN: ORAL | 0 refills | Status: DC

## 2016-07-13 NOTE — Telephone Encounter (Signed)
Patient stated he needs letter stating; Only because the [place] stated that the knee injury had nothing to do with the ankle.  In 1990 overseas he injured his ankle was seen by an orthopedic and was recommended ankle surgery in 1991. 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

## 2016-07-13 NOTE — Progress Notes (Signed)
Patient denies any allergies to eggs or soy. Patient denies any problems with anesthesia/sedation. Patient denies any oxygen use at home and does not take any diet/weight loss medications. EMMI education declined by the patient.

## 2016-07-14 ENCOUNTER — Encounter: Payer: Self-pay | Admitting: Family Medicine

## 2016-07-27 ENCOUNTER — Encounter: Payer: Self-pay | Admitting: Internal Medicine

## 2016-07-27 ENCOUNTER — Ambulatory Visit (AMBULATORY_SURGERY_CENTER): Admitting: Internal Medicine

## 2016-07-27 VITALS — BP 124/72 | HR 66 | Temp 98.2°F | Resp 15 | Ht 68.0 in | Wt 246.0 lb

## 2016-07-27 DIAGNOSIS — Z86010 Personal history of colonic polyps: Secondary | ICD-10-CM

## 2016-07-27 DIAGNOSIS — D122 Benign neoplasm of ascending colon: Secondary | ICD-10-CM | POA: Diagnosis not present

## 2016-07-27 MED ORDER — SODIUM CHLORIDE 0.9 % IV SOLN: 500.0000 mL | INTRAVENOUS | Status: DC

## 2016-07-27 NOTE — Progress Notes (Signed)
Called to room to assist during endoscopic procedure.  Patient ID and intended procedure confirmed with present staff. Received instructions for my participation in the procedure from the performing physician.  

## 2016-07-27 NOTE — Progress Notes (Signed)
Report to PACU, RN, vss, BBS= Clear.  

## 2016-07-27 NOTE — Op Note (Signed)
[facility] Patient Name: Brent [name] Procedure Date: 07/27/2016 8:34 AM MRN: [id number] Endoscopist: Docia Chuck. Henrene Pastor , MD Age: 62 Referring MD:  Date of Birth: [birth year] Gender: Male Account #: [id number] Procedure:                Colonoscopy, with cold snare polypectomy x 1 Indications:              High risk colon cancer surveillance: Personal                            history of multiple (3 or more) adenomas. Previous                            examinations 2002, 2004, 2007, 2012 Medicines:                Monitored Anesthesia Care Procedure:                Pre-Anesthesia Assessment:                           - Prior to the procedure, a History and Physical                            was performed, and patient medications and                            allergies were reviewed. The patient's tolerance of                            previous anesthesia was also reviewed. The risks                            and benefits of the procedure and the sedation                            options and risks were discussed with the patient.                            All questions were answered, and informed consent                            was obtained. Prior Anticoagulants: The patient has                            taken no previous anticoagulant or antiplatelet                            agents. ASA Grade Assessment: II - A patient with                            mild systemic disease. After reviewing the risks                            and benefits, the patient was deemed in  satisfactory condition to undergo the procedure.                           After obtaining informed consent, the colonoscope                            was passed under direct vision. Throughout the                            procedure, the patient's blood pressure, pulse, and                            oxygen saturations were monitored continuously. The    Model CF-HQ190L [id number]) scope was introduced                            through the anus and advanced to the the cecum,                            identified by appendiceal orifice and ileocecal                            valve. The ileocecal valve, appendiceal orifice,                            and rectum were photographed. The quality of the                            bowel preparation was excellent. The colonoscopy                            was performed without difficulty. The patient                            tolerated the procedure well. The bowel preparation                            used was SUPREP. Scope In: 8:48:42 AM Scope Out: 9:02:37 AM Scope Withdrawal Time: 0 hours 11 minutes 3 seconds  Total Procedure Duration: 0 hours 13 minutes 55 seconds  Findings:                 A 2 mm polyp was found in the ascending colon. The                            polyp was removed with a cold snare. Resection and                            retrieval were complete.                           Internal hemorrhoids were found during                            retroflexion. The hemorrhoids were small.  The exam was otherwise without abnormality on                            direct and retroflexion views. Complications:            No immediate complications. Estimated blood loss:                            None. Estimated Blood Loss:     Estimated blood loss: none. Impression:               - One 2 mm polyp in the ascending colon, removed                            with a cold snare. Resected and retrieved.                           - Internal hemorrhoids.                           - The examination was otherwise normal on direct                            and retroflexion views. Recommendation:           - Repeat colonoscopy in 5 years for surveillance.                           - Patient has a contact number available for                            emergencies.  The signs and symptoms of potential                            delayed complications were discussed with the                            patient. Return to normal activities tomorrow.                            Written discharge instructions were provided to the                            patient.                           - Resume previous diet.                           - Continue present medications.                           - Await pathology results. Docia Chuck. Henrene Pastor, MD 07/27/2016 9:09:08 AM This report has been signed electronically.

## 2016-07-27 NOTE — Progress Notes (Signed)
No change in medical or surgical hx since PV.  No hx of sleep apnea or home oxygen used

## 2016-07-27 NOTE — Patient Instructions (Signed)
Handout given: Polyps, Hemorrhoids.  YOU HAD AN ENDOSCOPIC PROCEDURE TODAY AT [facility]:   Refer to the procedure report that was given to you for any specific questions about what was found during the examination.  If the procedure report does not answer your questions, please call your gastroenterologist to clarify.  If you requested that your care partner not be given the details of your procedure findings, then the procedure report has been included in a sealed envelope for you to review at your convenience later.  YOU SHOULD EXPECT: Some feelings of bloating in the abdomen. Passage of more gas than usual.  Walking can help get rid of the air that was put into your GI tract during the procedure and reduce the bloating. If you had a lower endoscopy (such as a colonoscopy or flexible sigmoidoscopy) you may notice spotting of blood in your stool or on the toilet paper. If you underwent a bowel prep for your procedure, you may not have a normal bowel movement for a few days.  Please Note:  You might notice some irritation and congestion in your nose or some drainage.  This is from the oxygen used during your procedure.  There is no need for concern and it should clear up in a day or so.  SYMPTOMS TO REPORT IMMEDIATELY:   Following lower endoscopy (colonoscopy or flexible sigmoidoscopy):  Excessive amounts of blood in the stool  Significant tenderness or worsening of abdominal pains  Swelling of the abdomen that is new, acute  Fever of 100F or higher   For urgent or emergent issues, a gastroenterologist can be reached at any hour by calling [phone].   DIET:  We do recommend a small meal at first, but then you may proceed to your regular diet.  Drink plenty of fluids but you should avoid alcoholic beverages for 24 hours.  ACTIVITY:  You should plan to take it easy for the rest of today and you should NOT DRIVE or use heavy machinery until tomorrow (because of the  sedation medicines used during the test).    FOLLOW UP: Our staff will call the number listed on your records the next business day following your procedure to check on you and address any questions or concerns that you may have regarding the information given to you following your procedure. If we do not reach you, we will leave a message.  However, if you are feeling well and you are not experiencing any problems, there is no need to return our call.  We will assume that you have returned to your regular daily activities without incident.  If any biopsies were taken you will be contacted by phone or by letter within the next 1-3 weeks.  Please call us at [phone] if you have not heard about the biopsies in 3 weeks.    SIGNATURES/CONFIDENTIALITY: You and/or your care partner have signed paperwork which will be entered into your electronic medical record.  These signatures attest to the fact that that the information above on your After Visit Summary has been reviewed and is understood.  Full responsibility of the confidentiality of this discharge information lies with you and/or your care-partner.

## 2016-07-28 ENCOUNTER — Telehealth: Payer: Self-pay | Admitting: *Deleted

## 2016-07-28 NOTE — Telephone Encounter (Signed)
  Follow up Call-  Call back number 07/27/2016  Post procedure Call Back phone  # [phone]  Permission to leave phone message Yes  Some recent data might be hidden     Patient questions:  Do you have a fever, pain , or abdominal swelling? No. Pain Score  0 *  Have you tolerated food without any problems? Yes.    Have you been able to return to your normal activities? Yes.    Do you have any questions about your discharge instructions: Diet   No. Medications  No. Follow up visit  No.  Do you have questions or concerns about your Care? No.  Actions: * If pain score is 4 or above: No action needed, pain <4.

## 2016-07-30 ENCOUNTER — Encounter: Payer: Self-pay | Admitting: Family Medicine

## 2016-08-02 ENCOUNTER — Encounter: Payer: Self-pay | Admitting: Family Medicine

## 2016-08-04 ENCOUNTER — Encounter: Payer: Self-pay | Admitting: Internal Medicine

## 2016-08-05 ENCOUNTER — Encounter: Payer: Self-pay | Admitting: Family Medicine

## 2016-08-06 ENCOUNTER — Encounter: Payer: Self-pay | Admitting: Family Medicine

## 2016-08-06 MED ORDER — METHOCARBAMOL 500 MG PO TABS: 500.0000 mg | ORAL_TABLET | Freq: Four times a day (QID) | ORAL | 0 refills | Status: DC | PRN

## 2016-08-06 MED ORDER — TRAMADOL HCL 50 MG PO TABS: 50.0000 mg | ORAL_TABLET | Freq: Two times a day (BID) | ORAL | 0 refills | Status: DC

## 2016-08-06 MED FILL — traMADol HCL 50 MG TABS: 50 | 15 days supply | Qty: 30 | Fill #0

## 2016-08-07 ENCOUNTER — Encounter: Payer: Self-pay | Admitting: Family Medicine

## 2016-09-02 ENCOUNTER — Telehealth: Payer: Self-pay | Admitting: Family Medicine

## 2016-09-02 NOTE — Telephone Encounter (Signed)
OK to use appointments as need be

## 2016-09-02 NOTE — Telephone Encounter (Signed)
Relation to pt: self  Call back [phone]   Reason for call:  Patient would like to schedule appointment with PCP only, to access knee, neck, and ankle pain, patient states he would like PCP to fill out [facility] form and will bring to appointment regarding he's concerns,  please advise if hospital follow up slot can be utilized.  Patient informed of form policy, 5 to 10 day turnaround.

## 2016-09-03 ENCOUNTER — Other Ambulatory Visit: Payer: Self-pay | Admitting: Family Medicine

## 2016-09-03 ENCOUNTER — Telehealth: Payer: Self-pay | Admitting: Family Medicine

## 2016-09-03 MED ORDER — TRAMADOL HCL 50 MG PO TABS: 50.0000 mg | ORAL_TABLET | Freq: Two times a day (BID) | ORAL | 0 refills | Status: DC

## 2016-09-03 NOTE — Telephone Encounter (Signed)
Patient scheduled for Thu 09/10/2016 at 11:15am.

## 2016-09-03 NOTE — Telephone Encounter (Signed)
Requesting:   tramadol Contract   08/06/2016 UDS   Low risk---future is due on 02/05/17 Last OV    05/25/2016 Last Refill    #30 no refills on 08/06/2016  Please Advise

## 2016-09-03 NOTE — Telephone Encounter (Signed)
Relation to SW:FUXN Call back [facility]   Reason for call:  Patient requesting a refill traMADol (ULTRAM) 50 MG tablet

## 2016-09-03 NOTE — Telephone Encounter (Signed)
Faxed hardcopy for Tramadol to [facility]

## 2016-09-03 NOTE — Telephone Encounter (Signed)
OK to refill requested med with same quantity and sig

## 2016-09-04 MED ORDER — METHOCARBAMOL 500 MG PO TABS: 500.0000 mg | ORAL_TABLET | Freq: Four times a day (QID) | ORAL | 3 refills | Status: DC | PRN

## 2016-09-10 ENCOUNTER — Encounter: Payer: Self-pay | Admitting: Family Medicine

## 2016-09-10 ENCOUNTER — Ambulatory Visit (INDEPENDENT_AMBULATORY_CARE_PROVIDER_SITE_OTHER): Admitting: Family Medicine

## 2016-09-10 VITALS — BP 142/76 | HR 78 | Temp 98.0°F | Wt 247.4 lb

## 2016-09-10 DIAGNOSIS — I10 Essential (primary) hypertension: Secondary | ICD-10-CM

## 2016-09-10 DIAGNOSIS — M25571 Pain in right ankle and joints of right foot: Secondary | ICD-10-CM

## 2016-09-10 DIAGNOSIS — E782 Mixed hyperlipidemia: Secondary | ICD-10-CM | POA: Diagnosis not present

## 2016-09-10 DIAGNOSIS — G8929 Other chronic pain: Secondary | ICD-10-CM | POA: Diagnosis not present

## 2016-09-10 DIAGNOSIS — M542 Cervicalgia: Secondary | ICD-10-CM

## 2016-09-10 DIAGNOSIS — M25551 Pain in right hip: Secondary | ICD-10-CM

## 2016-09-10 DIAGNOSIS — M25562 Pain in left knee: Secondary | ICD-10-CM

## 2016-09-10 DIAGNOSIS — M25561 Pain in right knee: Secondary | ICD-10-CM | POA: Diagnosis not present

## 2016-09-10 DIAGNOSIS — M25552 Pain in left hip: Secondary | ICD-10-CM

## 2016-09-10 DIAGNOSIS — E538 Deficiency of other specified B group vitamins: Secondary | ICD-10-CM

## 2016-09-10 DIAGNOSIS — E118 Type 2 diabetes mellitus with unspecified complications: Secondary | ICD-10-CM

## 2016-09-10 DIAGNOSIS — M25572 Pain in left ankle and joints of left foot: Secondary | ICD-10-CM

## 2016-09-10 DIAGNOSIS — E559 Vitamin D deficiency, unspecified: Secondary | ICD-10-CM

## 2016-09-10 NOTE — Patient Instructions (Signed)
Ankle Pain Many things can cause ankle pain, including an injury to the area and overuse of the ankle.The ankle joint holds your body weight and allows you to move around. Ankle pain can occur on either side or the back of one ankle or both ankles. Ankle pain may be sharp and burning or dull and aching. There may be tenderness, stiffness, redness, or warmth around the ankle. Follow these instructions at home: Activity  Rest your ankle as told by your health care provider. Avoid any activities that cause ankle pain.  Do exercises as told by your health care provider.  Ask your health care provider if you can drive. Using a brace, a bandage, or crutches  If you were given a brace: ? Wear it as told by your health care provider. ? Remove it when you take a bath or a shower. ? Try not to move your ankle very much, but wiggle your toes from time to time. This helps to prevent swelling.  If you were given an elastic bandage: ? Remove it when you take a bath or a shower. ? Try not to move your ankle very much, but wiggle your toes from time to time. This helps to prevent swelling. ? Adjust the bandage to make it more comfortable if it feels too tight. ? Loosen the bandage if you have numbness or tingling in your foot or if your foot turns cold and blue.  If you have crutches, use them as told by your health care provider. Continue to use them until you can walk without feeling pain in your ankle. Managing pain, stiffness, and swelling  Raise (elevate) your ankle above the level of your heart while you are sitting or lying down.  If directed, apply ice to the area: ? Put ice in a plastic bag. ? Place a towel between your skin and the bag. ? Leave the ice on for 20 minutes, 2-3 times per day. General instructions  Keep all follow-up visits as told by your health care provider. This is important.  Record this information that may be helpful for you and your health care provider: ? How  often you have ankle pain. ? Where the pain is located. ? What the pain feels like.  Take over-the-counter and prescription medicines only as told by your health care provider. Contact a health care provider if:  Your pain gets worse.  Your pain is not relieved with medicines.  You have a fever or chills.  You are having more trouble with walking.  You have new symptoms. Get help right away if:  Your foot, leg, toes, or ankle tingles or becomes numb.  Your foot, leg, toes, or ankle becomes swollen.  Your foot, leg, toes, or ankle turns pale or blue. This information is not intended to replace advice given to you by your health care provider. Make sure you discuss any questions you have with your health care provider. Document Released: 09/24/2009 Document Revised: 12/06/2015 Document Reviewed: 11/06/2014 Elsevier Interactive Patient Education  2017 [employer]  

## 2016-09-10 NOTE — Progress Notes (Signed)
Pre visit review using our clinic review tool, if applicable. No additional management support is needed unless otherwise documented below in the visit note. 

## 2016-09-10 NOTE — Progress Notes (Signed)
Patient ID: Brent [name], male   DOB: February 06, 1956, 61 y.o.   MRN: [id number]   Subjective:  I acted as a Education administrator for Brent [name], [place], [place]   Patient ID: Brent [name], male    DOB: 05/20/1955, 61 y.o.   MRN: [id number]  Chief Complaint  Patient presents with  . VA Forms    In regards to R ankle, knee.    HPI  Patient is in today to have forms from the [employer] in regards to his right ankle>left ankle and knee pain (both knees), b/l hips and neck pain. He is int the process of having his service connected status reviewed by the [place]. Patient has a Hx of Type II Diabetes, HTN, OSA, CAD, mixed hyperlipidemia, gout, Vitamin D deficiency. Patient has no additional concerns noted at this time. His pain is daily and limits his mobility. He suffered his first serious injury to his right ankle in 1990 and was ultimately diagnosed with Osteochondritis [place]. He did not have surgical stabilization of the ankle until 1994. Since that time he has developed daily pain in hips, knees, ankles and neck. Denies CP/palp/SOB/HA/congestion/fevers/GI or GU c/o. Taking meds as prescribed  Patient Care Team: [name], MD as PCP - General (Family Medicine) Rana Snare, MD as Consulting Physician (Urology) Irene Shipper, MD as Consulting Physician (Gastroenterology) Rigoberto Noel, MD as Consulting Physician (Pulmonary Disease) Larey Dresser, MD as Consulting Physician (Cardiology) Paralee Cancel, MD as Consulting Physician (Orthopedic Surgery)   Past Medical History:  Diagnosis Date  . Acid reflux disease   . ACID REFLUX DISEASE 07/05/2007  . Anemia   . Atherosclerosis   . Benign neoplasm of colon 07/05/2007  . Breast pain, left 11/17/2011  . Chest pain    a. Reportedly negative dobut echo performed prior to gastric bypass in 03/2011;  b. 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 12/2011 Cath: LM nl, LAD 50p, D1 56m, LCX min irregs, OM3 30, RCA 25p, 11m (FFR 0.99->0.89), PDA  30, EF 65%, Med Rx.  . COLONIC POLYPS, HX OF 10/29/2008  . Diarrhea 06/13/2010  . Diverticulosis of colon   . DIVERTICULOSIS, COLON 10/29/2008  . DM (diabetes mellitus), type 2, uncontrolled ([place])   . GI bleed   . Gout 03/04/2013  . Hearing loss 05/24/2013   Previous audiology evaluation completely.  [name] HTN (hypertension) 07/14/2010  . Hx of colonic polyps   . HYPERSOMNIA, ASSOCIATED WITH SLEEP APNEA 07/26/2008  . Hypertension 07/14/2010  . 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 Knee pain, left 10/10/2010  . Morbid obesity ([place]) 03/27/2010   a. 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  [name] Neck pain 03/2015  . Other and unspecified hyperlipidemia 11/16/2012  . Preventative health care 11/17/2011  . Renal stone 11/2013  . Sleep apnea    a. CPAP  . Tear of meniscus of left knee 2012    Past Surgical History:  Procedure Laterality Date  . ANKLE SURGERY  1994  . CARDIAC CATHETERIZATION     denies any chest pain in the past 2 years  . COLONOSCOPY    . colonoscopy polyps    . ESOPHAGOGASTRODUODENOSCOPY N/A 03/27/2013   Procedure: ESOPHAGOGASTRODUODENOSCOPY (EGD);  Surgeon: Irene Shipper, MD;  Location: [facility];  Service: Endoscopy;  Laterality: N/A;  . GASTRIC BYPASS    . HERNIA REPAIR    . KNEE ARTHROSCOPY  11/06/10   Left, torn meniscus (repaired)  . LEFT HEART CATHETERIZATION WITH CORONARY ANGIOGRAM N/A  12/23/2011   Procedure: LEFT HEART CATHETERIZATION WITH CORONARY ANGIOGRAM;  Surgeon: Minus Breeding, MD;  Location: [facility] CATH LAB;  Service: Cardiovascular;  Laterality: N/A;  . right knee arthroscopy Right 07/05/14   Dr. Hart Robinsons, [place].  . TONSILLECTOMY  age 29  . TOTAL KNEE ARTHROPLASTY Right 01/20/2016   Procedure: RIGHT TOTAL KNEE ARTHROPLASTY;  Surgeon: Paralee Cancel, MD;  Location: [facility];  Service: Orthopedics;  Laterality: Right;  . UPPER GI ENDOSCOPY  03/27/13    Family History  Problem Relation Age of Onset  . Diabetes Mother   . Hypertension Mother   . Stroke Mother   .  Hyperlipidemia Mother   . Hypertension Father   . Colon polyps Father   . Heart attack Father 43  . Stroke Father   . Heart attack Brother   . Diabetes Brother   . Heart disease Brother   . Heart attack Brother        Multiple  . Diabetes Brother   . Other Brother        heart problems  . Heart disease Brother   . Diabetes Sister   . Obesity Brother   . Diabetes Brother   . Heart disease Brother   . Hypertension Maternal Grandmother   . ADD / ADHD Daughter   . Heart disease Brother   . Stomach cancer Neg Hx   . Colon cancer Neg Hx   . Esophageal cancer Neg Hx   . Rectal cancer Neg Hx     Social History   Social History  . Marital status: Married    Spouse name: N/A  . Number of children: 2  . Years of education: N/A   Occupational History  . [employer]   Social History Main Topics  . Smoking status: Former Smoker    Packs/day: 1.50    Years: 20.00    Types: Cigarettes    Quit date: 04/21/1991  . Smokeless tobacco: Never Used  . Alcohol use 1.8 oz/week    3 Cans of beer per week     Comment: social  . Drug use: No  . Sexual activity: Yes   Other Topics Concern  . Not on file   Social History Narrative   Lives with wife in [place].  Does not routinely exercise.    Outpatient Medications Prior to Visit  Medication Sig Dispense Refill  . B Complex Vitamins (VITAMIN-B COMPLEX) TABS Place 1 tablet under the tongue every morning.     . Cholecalciferol (VITAMIN D-3) 5000 UNITS TABS Take 1 tablet by mouth every morning.     . Cyanocobalamin (VITAMIN B-12) 1000 MCG SUBL Place 1 each under the tongue every morning.     [name] EPINEPHrine (EPIPEN 2-PAK) 0.3 mg/0.3 mL IJ SOAJ injection Inject 0.3 mLs (0.3 mg total) into the muscle once. 3 Device 1  . fenofibrate 160 MG tablet TAKE 1 TABLET DAILY 90 tablet 1  . glucose blood test strip Use as directed once daily to check blood sugar.  Diagnosis code E11.9 100 each 3  . Krill Oil 1000 MG CAPS Take 1  tablet by mouth 2 (two) times daily.     [name] LIVALO 1 MG TABS TAKE 1 TABLET DAILY 90 tablet 1  . metFORMIN (GLUCOPHAGE) 500 MG tablet TAKE 1 TABLET FOUR TIMES A DAY 360 tablet 1  . methocarbamol (ROBAXIN) 500 MG tablet Take 1 tablet (500 mg total) by mouth every 6 (six) hours as needed for muscle spasms. [place]  tablet 3  . metoprolol tartrate (LOPRESSOR) 25 MG tablet TAKE ONE-HALF (1/2) TABLET TWICE A DAY 90 tablet 1  . Multiple Vitamin (MULTIVITAMIN) tablet Take 1 tablet by mouth 2 (two) times daily. BARIATRIC VIT    . pantoprazole (PROTONIX) 40 MG tablet TAKE 1 TABLET TWICE A DAY 180 tablet 1  . traMADol (ULTRAM) 50 MG tablet Take 1 tablet (50 mg total) by mouth 2 (two) times daily. 30 tablet 0  . 0.9 %  sodium chloride infusion      No facility-administered medications prior to visit.     Allergies  Allergen Reactions  . Bee Venom Anaphylaxis  . Lipitor [Atorvastatin]     Myalgias, memory changes  . Morphine     hyperactive  . Hydrocodone Itching  . Pravastatin Other (See Comments)    Made joints hurt. --also simvastatin     Review of Systems  Constitutional: Negative for fever and malaise/fatigue.  HENT: Negative for congestion.   Eyes: Negative for blurred vision.  Respiratory: Negative for cough and shortness of breath.   Cardiovascular: Negative for chest pain, palpitations and leg swelling.  Gastrointestinal: Negative for vomiting.  Musculoskeletal: Positive for joint pain and neck pain. Negative for back pain.       R ankle. Both knees.  Skin: Negative for rash.  Neurological: Negative for loss of consciousness and headaches.       Objective:    Physical Exam  Constitutional: He is oriented to person, place, and time. He appears well-developed and well-nourished. No distress.  HENT:  Head: Normocephalic and atraumatic.  Eyes: Conjunctivae are normal.  Neck: Normal range of motion. No thyromegaly present.  Cardiovascular: Normal rate and regular rhythm.     Pulmonary/Chest: Effort normal and breath sounds normal. He has no wheezes.  Abdominal: Soft. Bowel sounds are normal. There is no tenderness.  Musculoskeletal: He exhibits no edema or deformity.  Neurological: He is alert and oriented to person, place, and time.  Skin: Skin is warm and dry. He is not diaphoretic.  Psychiatric: He has a normal mood and affect.    BP (!) 142/76 (BP Location: Left Arm, Patient Position: Sitting, Cuff Size: Normal)   Pulse 78   Temp 98 F (36.7 C) (Oral)   Wt 247 lb 6.4 oz (112.2 kg)   SpO2 98% Comment: RA  BMI 37.62 kg/m  Wt Readings from Last 3 Encounters:  09/10/16 247 lb 6.4 oz (112.2 kg)  07/27/16 246 lb (111.6 kg)  07/13/16 246 lb (111.6 kg)   BP Readings from Last 3 Encounters:  09/10/16 (!) 142/76  07/27/16 124/72  05/25/16 126/80     Immunization History  Administered Date(s) Administered  . Influenza Split 02/11/2011, 02/02/2012  . Influenza Whole 01/18/2010, 03/04/2013  . Influenza,inj,Quad PF,36+ Mos 05/08/2014, 04/02/2015  . Pneumococcal Conjugate-13 05/08/2014  . Pneumococcal Polysaccharide-23 05/27/2015  . Td 04/20/1996, 10/29/2008  . Tdap 03/01/2012  . Zoster 12/02/2015    Health Maintenance  Topic Date Due  . FOOT EXAM  11/15/2015  . OPHTHALMOLOGY EXAM  07/21/2016  . INFLUENZA VACCINE  11/18/2016  . HEMOGLOBIN A1C  11/22/2016  . URINE MICROALBUMIN  12/01/2016  . PNEUMOCOCCAL POLYSACCHARIDE VACCINE (2) 05/26/2020  . COLONOSCOPY  07/27/2021  . TETANUS/TDAP  03/01/2022  . Hepatitis C Screening  Completed  . HIV Screening  Completed    Lab Results  Component Value Date   WBC 5.4 05/25/2016   HGB 12.0 (L) 05/25/2016   HCT 36.4 (L) 05/25/2016   PLT 271.0  05/25/2016   GLUCOSE 130 (H) 05/25/2016   CHOL 134 05/25/2016   TRIG 131.0 05/25/2016   HDL 51.50 05/25/2016   LDLDIRECT 110.3 02/11/2011   LDLCALC 57 05/25/2016   ALT 16 05/25/2016   AST 21 05/25/2016   NA 139 05/25/2016   K 4.8 05/25/2016   CL 105  05/25/2016   CREATININE 0.90 05/25/2016   BUN 13 05/25/2016   CO2 29 05/25/2016   TSH 3.63 05/25/2016   PSA 0.84 11/15/2014   INR 1.13 03/29/2013   HGBA1C 6.5 05/25/2016   MICROALBUR 1.1 12/02/2015    Lab Results  Component Value Date   TSH 3.63 05/25/2016   Lab Results  Component Value Date   WBC 5.4 05/25/2016   HGB 12.0 (L) 05/25/2016   HCT 36.4 (L) 05/25/2016   MCV 81.3 05/25/2016   PLT 271.0 05/25/2016   Lab Results  Component Value Date   NA 139 05/25/2016   K 4.8 05/25/2016   CO2 29 05/25/2016   GLUCOSE 130 (H) 05/25/2016   BUN 13 05/25/2016   CREATININE 0.90 05/25/2016   BILITOT 0.6 05/25/2016   ALKPHOS 38 (L) 05/25/2016   AST 21 05/25/2016   ALT 16 05/25/2016   PROT 7.0 05/25/2016   ALBUMIN 4.6 05/25/2016   CALCIUM 9.6 05/25/2016   ANIONGAP 8 01/21/2016   GFR 91.29 05/25/2016   Lab Results  Component Value Date   CHOL 134 05/25/2016   Lab Results  Component Value Date   HDL 51.50 05/25/2016   Lab Results  Component Value Date   LDLCALC 57 05/25/2016   Lab Results  Component Value Date   TRIG 131.0 05/25/2016   Lab Results  Component Value Date   CHOLHDL 3 05/25/2016   Lab Results  Component Value Date   HGBA1C 6.5 05/25/2016         Assessment & Plan:   Problem List Items Addressed This Visit    Morbid obesity ([place])    S/p bypass. Encouraged DASH diet, decrease po intake and increase exercise as tolerated. Needs 7-8 hours of sleep nightly. Avoid trans fats, eat small, frequent meals every 4-5 hours with lean proteins, complex carbs and healthy fats. Minimize simple carbs, offered contact info for bariatric service      T2DM (type 2 diabetes mellitus) ([place])    hgba1c acceptable, minimize simple carbs. Increase exercise as tolerated. Continue current meds      Relevant Orders   Hemoglobin A1c   Essential hypertension    Well controlled, no changes to meds. Encouraged heart healthy diet such as the DASH diet and exercise as  tolerated.       Relevant Orders   CBC   Comprehensive metabolic panel   TSH   Chronic pain of both knees - Primary    S/p TKR but having trouble with both knees. 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 He is currently undergoing a review of his case with the [facility] to determine how much of his debility is service connected and we have agreed to help with his paper work. It is very likely that the vast majority of his chronic pain has been significantly contributed to by his previous injury. He is being referred to physical therapy for further completion of his forms.       Relevant Orders   Ambulatory referral to Physical Therapy   Hyperlipidemia, mixed    Tolerating statin, encouraged heart healthy diet, avoid trans  fats, minimize simple carbs and saturated fats. Increase exercise as tolerated      Relevant Orders   Lipid panel   Vitamin D deficiency   Relevant Orders   VITAMIN D 25 Hydroxy (Vit-D Deficiency, Fractures)   Neck pain, bilateral   Relevant Orders   Ambulatory referral to Physical Therapy   Chronic pain of both ankles    Dates back to his years in the [place] when he suffered a significant ankle injury. Is referred to physical therapy for further consideration.       Relevant Orders   Ambulatory referral to Physical Therapy   Pain of both hip joints    Chronic pain is daily, last been present for years and does limit his mobility to some degree      Relevant Orders   Ambulatory referral to Physical Therapy   Vitamin B12 deficiency   Relevant Orders   Vitamin B12      I am having Mr. Aiello maintain his multivitamin, Vitamin B-12, Vitamin D-3, Vitamin-B Complex, Krill Oil, glucose blood, EPINEPHrine, pantoprazole, metoprolol tartrate, fenofibrate, metFORMIN, LIVALO, methocarbamol, and traMADol. We will stop administering sodium chloride.  No orders of the defined types were placed in  this encounter.   CMA served as Education administrator during this visit. History, Physical and Plan performed by medical provider. Documentation and orders reviewed and attested to.  Brent Homans, MD

## 2016-09-14 ENCOUNTER — Other Ambulatory Visit: Payer: Self-pay | Admitting: Family Medicine

## 2016-09-14 DIAGNOSIS — E538 Deficiency of other specified B group vitamins: Secondary | ICD-10-CM | POA: Insufficient documentation

## 2016-09-14 DIAGNOSIS — M25551 Pain in right hip: Secondary | ICD-10-CM | POA: Insufficient documentation

## 2016-09-14 DIAGNOSIS — M25552 Pain in left hip: Secondary | ICD-10-CM | POA: Insufficient documentation

## 2016-09-14 DIAGNOSIS — G8929 Other chronic pain: Secondary | ICD-10-CM

## 2016-09-14 DIAGNOSIS — M25572 Pain in left ankle and joints of left foot: Secondary | ICD-10-CM

## 2016-09-14 DIAGNOSIS — M25571 Pain in right ankle and joints of right foot: Secondary | ICD-10-CM | POA: Insufficient documentation

## 2016-09-14 NOTE — Assessment & Plan Note (Signed)
Dates back to his years in the [place] when he suffered a significant ankle injury. Is referred to physical therapy for further consideration.

## 2016-09-14 NOTE — Assessment & Plan Note (Signed)
S/p TKR but having trouble with both knees. 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 He is currently undergoing a review of his case with the VA to determine how much of his debility is service connected and we have agreed to help with his paper work. It is very likely that the vast majority of his chronic pain has been significantly contributed to by his previous injury. He is being referred to physical therapy for further completion of his forms.

## 2016-09-14 NOTE — Assessment & Plan Note (Signed)
Well controlled, no changes to meds. Encouraged heart healthy diet such as the DASH diet and exercise as tolerated.  °

## 2016-09-14 NOTE — Assessment & Plan Note (Signed)
Tolerating statin, encouraged heart healthy diet, avoid trans fats, minimize simple carbs and saturated fats. Increase exercise as tolerated 

## 2016-09-14 NOTE — Assessment & Plan Note (Signed)
S/p bypass. Encouraged DASH diet, decrease po intake and increase exercise as tolerated. Needs 7-8 hours of sleep nightly. Avoid trans fats, eat small, frequent meals every 4-5 hours with lean proteins, complex carbs and healthy fats. Minimize simple carbs, offered contact info for bariatric service

## 2016-09-14 NOTE — Assessment & Plan Note (Signed)
hgba1c acceptable, minimize simple carbs. Increase exercise as tolerated. Continue current meds 

## 2016-09-14 NOTE — Assessment & Plan Note (Signed)
Chronic pain is daily, last been present for years and does limit his mobility to some degree

## 2016-09-18 ENCOUNTER — Telehealth: Payer: Self-pay | Admitting: Family Medicine

## 2016-09-18 ENCOUNTER — Encounter: Payer: Self-pay | Admitting: Family Medicine

## 2016-09-18 NOTE — Telephone Encounter (Signed)
Requesting:    tramadol Contract    08/06/2016 UDS    Low risk next is due on 02/05/2017 Last OV    09/10/2016 Last Refill   #30 no refills on 09/03/2016  Please Advise

## 2016-09-20 NOTE — Telephone Encounter (Signed)
OK to refill Tramadol 

## 2016-09-21 MED ORDER — TRAMADOL HCL 50 MG PO TABS: 50.0000 mg | ORAL_TABLET | Freq: Two times a day (BID) | ORAL | 0 refills | Status: DC

## 2016-09-21 NOTE — Addendum Note (Signed)
Addended by: Sharon Seller B on: 09/21/2016 07:27 AM   Modules accepted: Orders

## 2016-09-21 NOTE — Telephone Encounter (Signed)
Fax to [facility]

## 2016-09-24 ENCOUNTER — Telehealth: Payer: Self-pay

## 2016-09-24 ENCOUNTER — Telehealth: Payer: Self-pay | Admitting: *Deleted

## 2016-09-24 NOTE — Telephone Encounter (Signed)
Called patient left message for him to call the office back. Per Dr. Charlett Blake patient needs an office visit to complete the second part of his VA form for his ankle.  Patients VA form for his knee has been completed.  PCP wants him scheduled some time next week.   Pc

## 2016-09-24 NOTE — Telephone Encounter (Signed)
Received partially completed [place] from [facility] PT; to be completed paperwork forwarded to provider/SLS 06/07

## 2016-09-25 NOTE — Telephone Encounter (Signed)
Patient called back and schedule appointment for 6/18.

## 2016-10-05 ENCOUNTER — Ambulatory Visit (HOSPITAL_BASED_OUTPATIENT_CLINIC_OR_DEPARTMENT_OTHER)
Admission: RE | Admit: 2016-10-05 | Discharge: 2016-10-05 | Disposition: A | Discharge status: Home / Self Care | Source: Ambulatory Visit | Attending: Family Medicine | Admitting: Family Medicine

## 2016-10-05 ENCOUNTER — Encounter: Payer: Self-pay | Admitting: Family Medicine

## 2016-10-05 ENCOUNTER — Other Ambulatory Visit: Payer: Self-pay | Admitting: Family Medicine

## 2016-10-05 ENCOUNTER — Ambulatory Visit (INDEPENDENT_AMBULATORY_CARE_PROVIDER_SITE_OTHER): Admitting: Family Medicine

## 2016-10-05 VITALS — BP 121/67 | HR 62 | Temp 98.4°F | Resp 18 | Wt 249.0 lb

## 2016-10-05 DIAGNOSIS — M25552 Pain in left hip: Secondary | ICD-10-CM | POA: Diagnosis not present

## 2016-10-05 DIAGNOSIS — E118 Type 2 diabetes mellitus with unspecified complications: Secondary | ICD-10-CM | POA: Diagnosis not present

## 2016-10-05 DIAGNOSIS — M25551 Pain in right hip: Secondary | ICD-10-CM

## 2016-10-05 DIAGNOSIS — I10 Essential (primary) hypertension: Secondary | ICD-10-CM | POA: Diagnosis not present

## 2016-10-05 HISTORY — DX: Pain in right hip: M25.551

## 2016-10-05 HISTORY — DX: Pain in right hip: M25.552

## 2016-10-05 MED ORDER — TRAMADOL HCL 50 MG PO TABS: 50.0000 mg | ORAL_TABLET | Freq: Two times a day (BID) | ORAL | 0 refills | Status: DC

## 2016-10-05 MED FILL — traMADol HCL 50 MG TABS: 50 | 15 days supply | Qty: 30 | Fill #0

## 2016-10-05 NOTE — Assessment & Plan Note (Signed)
Well controlled, no changes to meds. Encouraged heart healthy diet such as the DASH diet and exercise as tolerated.  °

## 2016-10-05 NOTE — Assessment & Plan Note (Signed)
Daily pain in b/l hips leading to debility. His activities of daily living are diminished due to his bilateral hip pain and decreased range of motion. He has trouble with bending, squatting, position changes, prolonged ambulation. Has pain, debility and even some weakness secondary to his ongoing hip pain he first noted the pain in 1994 and was diagnosed with bursitis on top of his arthritis in 2015. Is currently undergoing review with the VA for service connection and is here today to complete forms.

## 2016-10-05 NOTE — Patient Instructions (Signed)

## 2016-10-05 NOTE — Assessment & Plan Note (Signed)
Patient notes first having pain in hips in 1994, diagnosed with bursitis in hips in 2015, had xrays thru [place] but not available today. Will check xrays of hips today. Pain limits

## 2016-10-05 NOTE — Progress Notes (Signed)
Subjective:  I acted as a Education administrator for Dr. Charlett Blake. [name], [place]  Patient ID: Brent [name], male    DOB: Apr 04, 1956, 61 y.o.   MRN: [id number]  No chief complaint on file.   HPI  Patient is in today for an acute visit to complete forms. He continues to struggle with b/l knee, hip, ankle and neck pain daily. Daily pain in b/l hips leading to debility. His activities of daily living are diminished due to his bilateral hip pain and decreased range of motion. He has trouble with bending, squatting, position changes, prolonged ambulation. Has pain, debility and even some weakness secondary to his ongoing hip pain he first noted the pain in 1994 and was diagnosed with bursitis on top of his arthritis in 2015. [facility]. Denies CP/palp/SOB/HA/congestion/fevers/GI or GU c/o. Taking meds as prescribed. No polyuria or polydipsia  Patient Care Team: [name], MD as PCP - General (Family Medicine) Rana Snare, MD as Consulting Physician (Urology) Irene Shipper, MD as Consulting Physician (Gastroenterology) Rigoberto Noel, MD as Consulting Physician (Pulmonary Disease) Larey Dresser, MD as Consulting Physician (Cardiology) Paralee Cancel, MD as Consulting Physician (Orthopedic Surgery)   Past Medical History:  Diagnosis Date  . Acid reflux disease   . ACID REFLUX DISEASE 07/05/2007  . Anemia   . Atherosclerosis   . Benign neoplasm of colon 07/05/2007  . Bilateral hip pain 10/05/2016  . Breast pain, left 11/17/2011  . Chest pain    a. Reportedly negative dobut echo performed prior to gastric bypass in 03/2011;  b. 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 12/2011 Cath: LM nl, LAD 50p, D1 47m, LCX min irregs, OM3 30, RCA 25p, 78m (FFR 0.99->0.89), PDA 30, EF 65%, Med Rx.  . COLONIC POLYPS, HX OF 10/29/2008  . Diarrhea 06/13/2010  . Diverticulosis of colon   . DIVERTICULOSIS, COLON 10/29/2008  . DM (diabetes mellitus), type 2, uncontrolled ([place])   . GI  bleed   . Gout 03/04/2013  . Hearing loss 05/24/2013   Previous audiology evaluation completely.  [name] HTN (hypertension) 07/14/2010  . Hx of colonic polyps   . HYPERSOMNIA, ASSOCIATED WITH SLEEP APNEA 07/26/2008  . Hypertension 07/14/2010  . 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 Knee pain, left 10/10/2010  . Morbid obesity ([employer]) 03/27/2010   a. 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  [name] Neck pain 03/2015  . Other and unspecified hyperlipidemia 11/16/2012  . Preventative health care 11/17/2011  . Renal stone 11/2013  . Sleep apnea    a. CPAP  . Tear of meniscus of left knee 2012    Past Surgical History:  Procedure Laterality Date  . ANKLE SURGERY  1994  . CARDIAC CATHETERIZATION     denies any chest pain in the past 2 years  . COLONOSCOPY    . colonoscopy polyps    . ESOPHAGOGASTRODUODENOSCOPY N/A 03/27/2013   Procedure: ESOPHAGOGASTRODUODENOSCOPY (EGD);  Surgeon: Irene Shipper, MD;  Location: [facility];  Service: Endoscopy;  Laterality: N/A;  . GASTRIC BYPASS    . HERNIA REPAIR    . KNEE ARTHROSCOPY  11/06/10   Left, torn meniscus (repaired)  . LEFT HEART CATHETERIZATION WITH CORONARY ANGIOGRAM N/A 12/23/2011   Procedure: LEFT HEART CATHETERIZATION WITH CORONARY ANGIOGRAM;  Surgeon: Minus Breeding, MD;  Location[facility];  Service: Cardiovascular;  Laterality: N/A;  . right knee arthroscopy Right 07/05/14   Dr. Hart Robinsons, [place].  . TONSILLECTOMY  age 66  . TOTAL KNEE ARTHROPLASTY Right 01/20/2016   Procedure: RIGHT TOTAL KNEE ARTHROPLASTY;  Surgeon: Paralee Cancel, MD;  Location: [facility];  Service: Orthopedics;  Laterality: Right;  . UPPER GI ENDOSCOPY  03/27/13    Family History  Problem Relation Age of Onset  . Diabetes Mother   . Hypertension Mother   . Stroke Mother   . Hyperlipidemia Mother   . Hypertension Father   . Colon polyps Father   . Heart attack Father 71  . Stroke Father   . Heart attack Brother   . Diabetes Brother   . Heart disease Brother   . Heart attack  Brother        Multiple  . Diabetes Brother   . Other Brother        heart problems  . Heart disease Brother   . Diabetes Sister   . Obesity Brother   . Diabetes Brother   . Heart disease Brother   . Hypertension Maternal Grandmother   . ADD / ADHD Daughter   . Heart disease Brother   . Stomach cancer Neg Hx   . Colon cancer Neg Hx   . Esophageal cancer Neg Hx   . Rectal cancer Neg Hx     Social History   Social History  . Marital status: Married    Spouse name: N/A  . Number of children: 2  . Years of education: N/A   Occupational History  . [employer]   Social History Main Topics  . Smoking status: Former Smoker    Packs/day: 1.50    Years: 20.00    Types: Cigarettes    Quit date: 04/21/1991  . Smokeless tobacco: Never Used  . Alcohol use 1.8 oz/week    3 Cans of beer per week     Comment: social  . Drug use: No  . Sexual activity: Yes   Other Topics Concern  . Not on file   Social History Narrative   Lives with wife in [place].  Does not routinely exercise.    Outpatient Medications Prior to Visit  Medication Sig Dispense Refill  . Cholecalciferol (VITAMIN D-3) 5000 UNITS TABS Take 1 tablet by mouth every morning.     . Cyanocobalamin (VITAMIN B-12) 1000 MCG SUBL Place 1 each under the tongue every morning.     [name] EPINEPHrine (EPIPEN 2-PAK) 0.3 mg/0.3 mL IJ SOAJ injection Inject 0.3 mLs (0.3 mg total) into the muscle once. 3 Device 1  . fenofibrate 160 MG tablet TAKE 1 TABLET DAILY 90 tablet 1  . glucose blood test strip Use as directed once daily to check blood sugar.  Diagnosis code E11.9 100 each 3  . Krill Oil 1000 MG CAPS Take 1 tablet by mouth 2 (two) times daily.     [name] LIVALO 1 MG TABS TAKE 1 TABLET DAILY 90 tablet 1  . metFORMIN (GLUCOPHAGE) 500 MG tablet TAKE 1 TABLET FOUR TIMES A DAY 360 tablet 1  . methocarbamol (ROBAXIN) 500 MG tablet Take 1 tablet (500 mg total) by mouth every 6 (six) hours as needed for muscle spasms. 40  tablet 3  . metoprolol tartrate (LOPRESSOR) 25 MG tablet TAKE ONE-HALF (1/2) TABLET TWICE A DAY 90 tablet 1  . Multiple Vitamin (MULTIVITAMIN) tablet Take 1 tablet by mouth 2 (two) times daily. BARIATRIC VIT    . pantoprazole (PROTONIX) 40 MG tablet TAKE 1 TABLET TWICE A DAY 180 tablet 1  . traMADol (ULTRAM) 50 MG tablet Take  1 tablet (50 mg total) by mouth 2 (two) times daily. 30 tablet 0  . B Complex Vitamins (VITAMIN-B COMPLEX) TABS Place 1 tablet under the tongue every morning.      No facility-administered medications prior to visit.     Allergies  Allergen Reactions  . Bee Venom Anaphylaxis  . Lipitor [Atorvastatin]     Myalgias, memory changes  . Morphine     hyperactive  . Hydrocodone Itching  . Pravastatin Other (See Comments)    Made joints hurt. --also simvastatin     Review of Systems  Constitutional: Negative for fever and malaise/fatigue.  HENT: Negative for congestion.   Eyes: Negative for blurred vision.  Respiratory: Negative for cough and shortness of breath.   Cardiovascular: Negative for chest pain, palpitations and leg swelling.  Gastrointestinal: Negative for vomiting.  Musculoskeletal: Positive for back pain, joint pain and neck pain.  Skin: Negative for rash.  Neurological: Negative for loss of consciousness and headaches.       Objective:    Physical Exam  Constitutional: He is oriented to person, place, and time. He appears well-developed and well-nourished. No distress.  HENT:  Head: Normocephalic and atraumatic.  Eyes: Conjunctivae are normal.  Neck: Normal range of motion. No thyromegaly present.  Cardiovascular: Normal rate and regular rhythm.   Pulmonary/Chest: Effort normal and breath sounds normal. He has no wheezes.  Abdominal: Soft. Bowel sounds are normal. There is no tenderness.  Musculoskeletal: Normal range of motion. He exhibits tenderness. He exhibits no edema or deformity.  Tender over right lateral hip and left posterior hip  with palpation. Pain with passive internal and external rotation hip.   Neurological: He is alert and oriented to person, place, and time.  Skin: Skin is warm and dry. He is not diaphoretic.  Psychiatric: He has a normal mood and affect.    BP 121/67 (BP Location: Left Arm, Patient Position: Sitting, Cuff Size: Normal)   Pulse 62   Temp 98.4 F (36.9 C) (Oral)   Resp 18   Wt 249 lb (112.9 kg)   SpO2 99%   BMI 37.86 kg/m  Wt Readings from Last 3 Encounters:  10/05/16 249 lb (112.9 kg)  09/10/16 247 lb 6.4 oz (112.2 kg)  07/27/16 246 lb (111.6 kg)   BP Readings from Last 3 Encounters:  10/05/16 121/67  09/10/16 (!) 142/76  07/27/16 124/72     Immunization History  Administered Date(s) Administered  . Influenza Split 02/11/2011, 02/02/2012  . Influenza Whole 01/18/2010, 03/04/2013  . Influenza,inj,Quad PF,36+ Mos 05/08/2014, 04/02/2015  . Pneumococcal Conjugate-13 05/08/2014  . Pneumococcal Polysaccharide-23 05/27/2015  . Td 04/20/1996, 10/29/2008  . Tdap 03/01/2012  . Zoster 12/02/2015    Health Maintenance  Topic Date Due  . FOOT EXAM  11/15/2015  . OPHTHALMOLOGY EXAM  07/21/2016  . INFLUENZA VACCINE  11/18/2016  . HEMOGLOBIN A1C  11/22/2016  . URINE MICROALBUMIN  12/01/2016  . PNEUMOCOCCAL POLYSACCHARIDE VACCINE (2) 05/26/2020  . COLONOSCOPY  07/27/2021  . TETANUS/TDAP  03/01/2022  . Hepatitis C Screening  Completed  . HIV Screening  Completed    Lab Results  Component Value Date   WBC 5.4 05/25/2016   HGB 12.0 (L) 05/25/2016   HCT 36.4 (L) 05/25/2016   PLT 271.0 05/25/2016   GLUCOSE 130 (H) 05/25/2016   CHOL 134 05/25/2016   TRIG 131.0 05/25/2016   HDL 51.50 05/25/2016   LDLDIRECT 110.3 02/11/2011   LDLCALC 57 05/25/2016   ALT 16 05/25/2016   AST  21 05/25/2016   NA 139 05/25/2016   K 4.8 05/25/2016   CL 105 05/25/2016   CREATININE 0.90 05/25/2016   BUN 13 05/25/2016   CO2 29 05/25/2016   TSH 3.63 05/25/2016   PSA 0.84 11/15/2014   INR 1.13  03/29/2013   HGBA1C 6.5 05/25/2016   MICROALBUR 1.1 12/02/2015    Lab Results  Component Value Date   TSH 3.63 05/25/2016   Lab Results  Component Value Date   WBC 5.4 05/25/2016   HGB 12.0 (L) 05/25/2016   HCT 36.4 (L) 05/25/2016   MCV 81.3 05/25/2016   PLT 271.0 05/25/2016   Lab Results  Component Value Date   NA 139 05/25/2016   K 4.8 05/25/2016   CO2 29 05/25/2016   GLUCOSE 130 (H) 05/25/2016   BUN 13 05/25/2016   CREATININE 0.90 05/25/2016   BILITOT 0.6 05/25/2016   ALKPHOS 38 (L) 05/25/2016   AST 21 05/25/2016   ALT 16 05/25/2016   PROT 7.0 05/25/2016   ALBUMIN 4.6 05/25/2016   CALCIUM 9.6 05/25/2016   ANIONGAP 8 01/21/2016   GFR 91.29 05/25/2016   Lab Results  Component Value Date   CHOL 134 05/25/2016   Lab Results  Component Value Date   HDL 51.50 05/25/2016   Lab Results  Component Value Date   LDLCALC 57 05/25/2016   Lab Results  Component Value Date   TRIG 131.0 05/25/2016   Lab Results  Component Value Date   CHOLHDL 3 05/25/2016   Lab Results  Component Value Date   HGBA1C 6.5 05/25/2016         Assessment & Plan:   Problem List Items Addressed This Visit    T2DM (type 2 diabetes mellitus) ([place])    hgba1c acceptable, minimize simple carbs. Increase exercise as tolerated.      Essential hypertension    Well controlled, no changes to meds. Encouraged heart healthy diet such as the DASH diet and exercise as tolerated.       Pain of both hip joints    Patient notes first having pain in hips in 1994, diagnosed with bursitis in hips in 2015, had xrays thru [place] but not available today. Will check xrays of hips today. Pain limits       Bilateral hip pain    Other Visit Diagnoses    Hip pain, bilateral    -  Primary   Relevant Orders   DG HIP UNILAT WITH PELVIS 2-3 VIEWS LEFT      I have discontinued Mr. Bowdish's Vitamin-B Complex. I am also having him maintain his multivitamin, Vitamin B-12, Vitamin D-3, Krill Oil, glucose  blood, EPINEPHrine, metoprolol tartrate, metFORMIN, LIVALO, methocarbamol, fenofibrate, pantoprazole, and traMADol.  Meds ordered this encounter  Medications  . traMADol (ULTRAM) 50 MG tablet    Sig: Take 1 tablet (50 mg total) by mouth 2 (two) times daily.    Dispense:  30 tablet    Refill:  0    CMA served as scribe during this visit. History, Physical and Plan performed by medical provider. Documentation and orders reviewed and attested to.  [name], MD

## 2016-10-05 NOTE — Assessment & Plan Note (Signed)
hgba1c acceptable, minimize simple carbs. Increase exercise as tolerated.  

## 2016-10-28 ENCOUNTER — Encounter: Payer: Self-pay | Admitting: Family Medicine

## 2016-10-28 ENCOUNTER — Other Ambulatory Visit: Payer: Self-pay | Admitting: Family Medicine

## 2016-10-28 MED ORDER — MOMETASONE FUROATE 0.1 % EX CREA: 1.0000 "application " | TOPICAL_CREAM | Freq: Every day | CUTANEOUS | 1 refills | Status: DC

## 2016-11-03 ENCOUNTER — Encounter: Payer: Self-pay | Admitting: Family Medicine

## 2016-11-03 ENCOUNTER — Ambulatory Visit (INDEPENDENT_AMBULATORY_CARE_PROVIDER_SITE_OTHER): Admitting: Family Medicine

## 2016-11-03 DIAGNOSIS — M25572 Pain in left ankle and joints of left foot: Secondary | ICD-10-CM

## 2016-11-03 DIAGNOSIS — M25571 Pain in right ankle and joints of right foot: Secondary | ICD-10-CM

## 2016-11-03 DIAGNOSIS — G8929 Other chronic pain: Secondary | ICD-10-CM

## 2016-11-03 DIAGNOSIS — E118 Type 2 diabetes mellitus with unspecified complications: Secondary | ICD-10-CM | POA: Diagnosis not present

## 2016-11-03 DIAGNOSIS — M542 Cervicalgia: Secondary | ICD-10-CM

## 2016-11-03 DIAGNOSIS — I10 Essential (primary) hypertension: Secondary | ICD-10-CM

## 2016-11-03 MED ORDER — TRAMADOL HCL 50 MG PO TABS: 50.0000 mg | ORAL_TABLET | Freq: Two times a day (BID) | ORAL | 0 refills | Status: DC

## 2016-11-03 MED FILL — traMADol HCL 50 MG TABS: 50 | 15 days supply | Qty: 30 | Fill #0

## 2016-11-03 NOTE — Patient Instructions (Signed)

## 2016-11-03 NOTE — Progress Notes (Signed)
Subjective:  I acted as a Education administrator for Dr. Charlett Blake. Brent, [name]  Patient ID: Brent [name], male    DOB: 10/08/1955, 61 y.o.   MRN: [id number]  No chief complaint on file.   HPI  Patient is in today for a follow up visit. 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 He is hoping to have his chronic neck pain and chronic bilateral ankle pain which have been present since his days in service to be qualified as service connected. No recent trauma or new illness. Denies CP/palp/SOB/HA/congestion/fevers/GI or GU c/o. Taking meds as prescribed  Patient Care Team: [name], MD as PCP - General (Family Medicine) Rana Snare, MD as Consulting Physician (Urology) Irene Shipper, MD as Consulting Physician (Gastroenterology) Rigoberto Noel, MD as Consulting Physician (Pulmonary Disease) Larey Dresser, MD as Consulting Physician (Cardiology) Paralee Cancel, MD as Consulting Physician (Orthopedic Surgery)   Past Medical History:  Diagnosis Date  . Acid reflux disease   . ACID REFLUX DISEASE 07/05/2007  . Anemia   . Atherosclerosis   . Benign neoplasm of colon 07/05/2007  . Bilateral hip pain 10/05/2016  . Breast pain, left 11/17/2011  . Chest pain    a. Reportedly negative dobut echo performed prior to gastric bypass in 03/2011;  b. 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 12/2011 Cath: LM nl, LAD 50p, D1 40m, LCX min irregs, OM3 30, RCA 25p, 36m (FFR 0.99->0.89), PDA 30, EF 65%, Med Rx.  . COLONIC POLYPS, HX OF 10/29/2008  . Diarrhea 06/13/2010  . Diverticulosis of colon   . DIVERTICULOSIS, COLON 10/29/2008  . DM (diabetes mellitus), type 2, uncontrolled ([place])   . GI bleed   . Gout 03/04/2013  . Hearing loss 05/24/2013   Previous audiology evaluation completely.  [name] HTN (hypertension) 07/14/2010  . Hx of colonic polyps   . HYPERSOMNIA, ASSOCIATED WITH SLEEP APNEA 07/26/2008  . Hypertension 07/14/2010  . 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 Knee pain, left 10/10/2010  . Morbid obesity ([place]) 03/27/2010   a. 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  [name] Neck pain 03/2015  . Other and unspecified hyperlipidemia 11/16/2012  . Preventative health care 11/17/2011  . Renal stone 11/2013  . Sleep apnea    a. CPAP  . Tear of meniscus of left knee 2012    Past Surgical History:  Procedure Laterality Date  . ANKLE SURGERY  1994  . CARDIAC CATHETERIZATION     denies any chest pain in the past 2 years  . COLONOSCOPY    . colonoscopy polyps    . ESOPHAGOGASTRODUODENOSCOPY N/A 03/27/2013   Procedure: ESOPHAGOGASTRODUODENOSCOPY (EGD);  Surgeon: Irene Shipper, MD;  Location: [facility];  Service: Endoscopy;  Laterality: N/A;  . GASTRIC BYPASS    . HERNIA REPAIR    . KNEE ARTHROSCOPY  11/06/10   Left, torn meniscus (repaired)  . LEFT HEART CATHETERIZATION WITH CORONARY ANGIOGRAM N/A 12/23/2011   Procedure: LEFT HEART CATHETERIZATION WITH CORONARY ANGIOGRAM;  Surgeon: Minus Breeding, MD;  Location: [facility] CATH LAB;  Service: Cardiovascular;  Laterality: N/A;  . right knee arthroscopy Right 07/05/14   Dr. Hart Robinsons, [place].  . TONSILLECTOMY  age 54  . TOTAL KNEE ARTHROPLASTY Right 01/20/2016   Procedure: RIGHT TOTAL KNEE ARTHROPLASTY;  Surgeon: Paralee Cancel, MD;  Location: [facility];  Service: Orthopedics;  Laterality: Right;  . UPPER GI ENDOSCOPY  03/27/13    Family History  Problem  Relation Age of Onset  . Diabetes Mother   . Hypertension Mother   . Stroke Mother   . Hyperlipidemia Mother   . Hypertension Father   . Colon polyps Father   . Heart attack Father 64  . Stroke Father   . Heart attack Brother   . Diabetes Brother   . Heart disease Brother   . Heart attack Brother        Multiple  . Diabetes Brother   . Other Brother        heart problems  . Heart disease Brother   . Diabetes Sister   . Obesity Brother   . Diabetes Brother   . Heart disease Brother   . Hypertension Maternal Grandmother   . ADD / ADHD Daughter   . Heart  disease Brother   . Stomach cancer Neg Hx   . Colon cancer Neg Hx   . Esophageal cancer Neg Hx   . Rectal cancer Neg Hx     Social History   Social History  . Marital status: Married    Spouse name: N/A  . Number of children: 2  . Years of education: N/A   Occupational History  . [employer]   Social History Main Topics  . Smoking status: Former Smoker    Packs/day: 1.50    Years: 20.00    Types: Cigarettes    Quit date: 04/21/1991  . Smokeless tobacco: Never Used  . Alcohol use 1.8 oz/week    3 Cans of beer per week     Comment: social  . Drug use: No  . Sexual activity: Yes   Other Topics Concern  . Not on file   Social History Narrative   Lives with wife in [place].  Does not routinely exercise.    Outpatient Medications Prior to Visit  Medication Sig Dispense Refill  . Cholecalciferol (VITAMIN D-3) 5000 UNITS TABS Take 1 tablet by mouth every morning.     . Cyanocobalamin (VITAMIN B-12) 1000 MCG SUBL Place 1 each under the tongue every morning.     [name] EPINEPHrine (EPIPEN 2-PAK) 0.3 mg/0.3 mL IJ SOAJ injection Inject 0.3 mLs (0.3 mg total) into the muscle once. 3 Device 1  . fenofibrate 160 MG tablet TAKE 1 TABLET DAILY 90 tablet 1  . glucose blood test strip Use as directed once daily to check blood sugar.  Diagnosis code E11.9 100 each 3  . Krill Oil 1000 MG CAPS Take 1 tablet by mouth 2 (two) times daily.     [name] LIVALO 1 MG TABS TAKE 1 TABLET DAILY 90 tablet 1  . metFORMIN (GLUCOPHAGE) 500 MG tablet TAKE 1 TABLET FOUR TIMES A DAY 360 tablet 1  . methocarbamol (ROBAXIN) 500 MG tablet Take 1 tablet (500 mg total) by mouth every 6 (six) hours as needed for muscle spasms. 40 tablet 3  . metoprolol tartrate (LOPRESSOR) 25 MG tablet TAKE ONE-HALF (1/2) TABLET TWICE A DAY 90 tablet 1  . mometasone (ELOCON) 0.1 % cream Apply 1 application topically daily. 50 g 1  . Multiple Vitamin (MULTIVITAMIN) tablet Take 1 tablet by mouth 2 (two) times daily.  BARIATRIC VIT    . pantoprazole (PROTONIX) 40 MG tablet TAKE 1 TABLET TWICE A DAY 180 tablet 1  . traMADol (ULTRAM) 50 MG tablet Take 1 tablet (50 mg total) by mouth 2 (two) times daily. 30 tablet 0   No facility-administered medications prior to visit.     Allergies  Allergen Reactions  .  Bee Venom Anaphylaxis  . Lipitor [Atorvastatin]     Myalgias, memory changes  . Morphine     hyperactive  . Hydrocodone Itching  . Pravastatin Other (See Comments)    Made joints hurt. --also simvastatin     Review of Systems  Constitutional: Negative for fever and malaise/fatigue.  HENT: Negative for congestion.   Eyes: Negative for blurred vision.  Respiratory: Negative for shortness of breath.   Cardiovascular: Negative for chest pain, palpitations and leg swelling.  Gastrointestinal: Negative for abdominal pain, blood in stool and nausea.  Genitourinary: Negative for dysuria and frequency.  Musculoskeletal: Positive for back pain, joint pain and neck pain. Negative for falls.  Skin: Negative for rash.  Neurological: Positive for weakness. Negative for dizziness, loss of consciousness and headaches.  Endo/Heme/Allergies: Negative for environmental allergies.  Psychiatric/Behavioral: Negative for depression. The patient is not nervous/anxious.        Objective:    Physical Exam  Constitutional: He is oriented to person, place, and time. He appears well-developed and well-nourished. No distress.  HENT:  Head: Normocephalic and atraumatic.  Nose: Nose normal.  Eyes: Right eye exhibits no discharge. Left eye exhibits no discharge.  Neck: Normal range of motion. Neck supple.  Cardiovascular: Normal rate and regular rhythm.   No murmur heard. Pulmonary/Chest: Effort normal and breath sounds normal.  Abdominal: Soft. Bowel sounds are normal. There is no tenderness.  Musculoskeletal: He exhibits no edema.  Neurological: He is alert and oriented to person, place, and time.  Skin: Skin is  warm and dry.  Psychiatric: He has a normal mood and affect.  Nursing note and vitals reviewed.   BP 128/66 (BP Location: Left Arm, Patient Position: Sitting, Cuff Size: Normal)   Pulse (!) 107   Temp 98.4 F (36.9 C) (Oral)   Resp 18   Wt 247 lb 9.6 oz (112.3 kg)   SpO2 97%   BMI 37.65 kg/m  Wt Readings from Last 3 Encounters:  11/03/16 247 lb 9.6 oz (112.3 kg)  10/05/16 249 lb (112.9 kg)  09/10/16 247 lb 6.4 oz (112.2 kg)   BP Readings from Last 3 Encounters:  11/03/16 128/66  10/05/16 121/67  09/10/16 (!) 142/76     Immunization History  Administered Date(s) Administered  . Influenza Split 02/11/2011, 02/02/2012  . Influenza Whole 01/18/2010, 03/04/2013  . Influenza,inj,Quad PF,36+ Mos 05/08/2014, 04/02/2015  . Pneumococcal Conjugate-13 05/08/2014  . Pneumococcal Polysaccharide-23 05/27/2015  . Td 04/20/1996, 10/29/2008  . Tdap 03/01/2012  . Zoster 12/02/2015    Health Maintenance  Topic Date Due  . FOOT EXAM  11/15/2015  . OPHTHALMOLOGY EXAM  07/21/2016  . INFLUENZA VACCINE  11/18/2016  . HEMOGLOBIN A1C  11/22/2016  . URINE MICROALBUMIN  12/01/2016  . PNEUMOCOCCAL POLYSACCHARIDE VACCINE (2) 05/26/2020  . COLONOSCOPY  07/27/2021  . TETANUS/TDAP  03/01/2022  . Hepatitis C Screening  Completed  . HIV Screening  Completed    Lab Results  Component Value Date   WBC 5.4 05/25/2016   HGB 12.0 (L) 05/25/2016   HCT 36.4 (L) 05/25/2016   PLT 271.0 05/25/2016   GLUCOSE 130 (H) 05/25/2016   CHOL 134 05/25/2016   TRIG 131.0 05/25/2016   HDL 51.50 05/25/2016   LDLDIRECT 110.3 02/11/2011   LDLCALC 57 05/25/2016   ALT 16 05/25/2016   AST 21 05/25/2016   NA 139 05/25/2016   K 4.8 05/25/2016   CL 105 05/25/2016   CREATININE 0.90 05/25/2016   BUN 13 05/25/2016   CO2 29  05/25/2016   TSH 3.63 05/25/2016   PSA 0.84 11/15/2014   INR 1.13 03/29/2013   HGBA1C 6.5 05/25/2016   MICROALBUR 1.1 12/02/2015    Lab Results  Component Value Date   TSH 3.63 05/25/2016    Lab Results  Component Value Date   WBC 5.4 05/25/2016   HGB 12.0 (L) 05/25/2016   HCT 36.4 (L) 05/25/2016   MCV 81.3 05/25/2016   PLT 271.0 05/25/2016   Lab Results  Component Value Date   NA 139 05/25/2016   K 4.8 05/25/2016   CO2 29 05/25/2016   GLUCOSE 130 (H) 05/25/2016   BUN 13 05/25/2016   CREATININE 0.90 05/25/2016   BILITOT 0.6 05/25/2016   ALKPHOS 38 (L) 05/25/2016   AST 21 05/25/2016   ALT 16 05/25/2016   PROT 7.0 05/25/2016   ALBUMIN 4.6 05/25/2016   CALCIUM 9.6 05/25/2016   ANIONGAP 8 01/21/2016   GFR 91.29 05/25/2016   Lab Results  Component Value Date   CHOL 134 05/25/2016   Lab Results  Component Value Date   HDL 51.50 05/25/2016   Lab Results  Component Value Date   LDLCALC 57 05/25/2016   Lab Results  Component Value Date   TRIG 131.0 05/25/2016   Lab Results  Component Value Date   CHOLHDL 3 05/25/2016   Lab Results  Component Value Date   HGBA1C 6.5 05/25/2016         Assessment & Plan:   Problem List Items Addressed This Visit    Morbid obesity ([place])    Encouraged DASH diet, decrease po intake and increase exercise as tolerated. Needs 7-8 hours of sleep nightly. Avoid trans fats, eat small, frequent meals every 4-5 hours with lean proteins, complex carbs and healthy fats. Minimize simple carbs      T2DM (type 2 diabetes mellitus) ([facility])    hgba1c acceptable, minimize simple carbs. Increase exercise as tolerated.       Essential hypertension    Well controlled, no changes to meds. Encouraged heart healthy diet such as the DASH diet and exercise as tolerated.       Neck pain, bilateral    [facility] his [facility] connected injury to his neck with progressively worsening pain in his neck with decreased ROM and chronic spasm.      Chronic pain of both ankles    [facility] for [facility] to document his ankle injury which occurred in service  And he has been struggling with progressively worsening ankle  pain since then, which limits his activity         I am having Mr. Antenucci maintain his multivitamin, Vitamin B-12, Vitamin D-3, Krill Oil, glucose blood, EPINEPHrine, metoprolol tartrate, metFORMIN, LIVALO, methocarbamol, fenofibrate, pantoprazole, mometasone, and traMADol.  Meds ordered this encounter  Medications  . traMADol (ULTRAM) 50 MG tablet    Sig: Take 1 tablet (50 mg total) by mouth 2 (two) times daily.    Dispense:  30 tablet    Refill:  0    CMA served as scribe during this visit. History, Physical and Plan performed by medical provider. Documentation and orders reviewed and attested to.  [name], MD

## 2016-11-04 NOTE — Assessment & Plan Note (Signed)
hgba1c acceptable, minimize simple carbs. Increase exercise as tolerated.  

## 2016-11-04 NOTE — Assessment & Plan Note (Signed)
Filled [facility] for the [facility] due to his service connected injury to his neck with progressively worsening pain in his neck with decreased ROM and chronic spasm.

## 2016-11-04 NOTE — Assessment & Plan Note (Signed)
[facility] for [facility] to document his ankle injury which occurred in service  And he has been struggling with progressively worsening ankle pain since then, which limits his activity

## 2016-11-04 NOTE — Assessment & Plan Note (Signed)
Encouraged DASH diet, decrease po intake and increase exercise as tolerated. Needs 7-8 hours of sleep nightly. Avoid trans fats, eat small, frequent meals every 4-5 hours with lean proteins, complex carbs and healthy fats. Minimize simple carbs 

## 2016-11-04 NOTE — Assessment & Plan Note (Signed)
Well controlled, no changes to meds. Encouraged heart healthy diet such as the DASH diet and exercise as tolerated.  °

## 2016-11-13 ENCOUNTER — Other Ambulatory Visit

## 2016-11-17 ENCOUNTER — Ambulatory Visit (INDEPENDENT_AMBULATORY_CARE_PROVIDER_SITE_OTHER): Admitting: Family Medicine

## 2016-11-17 DIAGNOSIS — G8929 Other chronic pain: Secondary | ICD-10-CM

## 2016-11-17 DIAGNOSIS — E538 Deficiency of other specified B group vitamins: Secondary | ICD-10-CM

## 2016-11-17 DIAGNOSIS — M25561 Pain in right knee: Secondary | ICD-10-CM | POA: Diagnosis not present

## 2016-11-17 DIAGNOSIS — E118 Type 2 diabetes mellitus with unspecified complications: Secondary | ICD-10-CM | POA: Diagnosis not present

## 2016-11-17 DIAGNOSIS — E782 Mixed hyperlipidemia: Secondary | ICD-10-CM

## 2016-11-17 DIAGNOSIS — E559 Vitamin D deficiency, unspecified: Secondary | ICD-10-CM | POA: Diagnosis not present

## 2016-11-17 DIAGNOSIS — K219 Gastro-esophageal reflux disease without esophagitis: Secondary | ICD-10-CM

## 2016-11-17 DIAGNOSIS — I10 Essential (primary) hypertension: Secondary | ICD-10-CM | POA: Diagnosis not present

## 2016-11-17 DIAGNOSIS — M25562 Pain in left knee: Secondary | ICD-10-CM

## 2016-11-17 LAB — COMPREHENSIVE METABOLIC PANEL
ALT: 17 U/L (ref 0–53)
AST: 21 U/L (ref 0–37)
Albumin: 4.3 g/dL (ref 3.5–5.2)
Alkaline Phosphatase: 39 U/L (ref 39–117)
BUN: 15 mg/dL (ref 6–23)
CO2: 25 mEq/L (ref 19–32)
Calcium: 9.2 mg/dL (ref 8.4–10.5)
Chloride: 107 mEq/L (ref 96–112)
Creatinine, Ser: 0.85 mg/dL (ref 0.40–1.50)
GFR: 97.36 mL/min (ref >60.00)
Glucose, Bld: 194 mg/dL — ABNORMAL HIGH (ref 70–99)
Potassium: 4.7 mEq/L (ref 3.5–5.1)
Sodium: 141 mEq/L (ref 135–145)
Total Bilirubin: 0.4 mg/dL (ref 0.2–1.2)
Total Protein: 6.6 g/dL (ref 6.0–8.3)

## 2016-11-17 LAB — CBC
HCT: 36.9 % — ABNORMAL LOW (ref 39.0–52.0)
Hemoglobin: 11.9 g/dL — ABNORMAL LOW (ref 13.0–17.0)
MCHC: 32.2 g/dL (ref 30.0–36.0)
MCV: 85.3 fl (ref 78.0–100.0)
Platelets: 197 10*3/uL (ref 150.0–400.0)
RBC: 4.33 Mil/uL (ref 4.22–5.81)
RDW: 15.1 % (ref 11.5–15.5)
WBC: 3.4 10*3/uL — ABNORMAL LOW (ref 4.0–10.5)

## 2016-11-17 LAB — VITAMIN D 25 HYDROXY (VIT D DEFICIENCY, FRACTURES): VITD: 36.63 ng/mL (ref 30.00–100.00)

## 2016-11-17 LAB — TSH: TSH: 1.38 u[IU]/mL (ref 0.35–4.50)

## 2016-11-17 LAB — LIPID PANEL
Cholesterol: 155 mg/dL (ref 0–200)
HDL: 50.4 mg/dL (ref >39.00)
NonHDL: 104.28
Total CHOL/HDL Ratio: 3
Triglycerides: 208 mg/dL — ABNORMAL HIGH (ref 0.0–149.0)
VLDL: 41.6 mg/dL — ABNORMAL HIGH (ref 0.0–40.0)

## 2016-11-17 LAB — VITAMIN B12: Vitamin B-12: >1500 pg/mL — ABNORMAL HIGH (ref 211–911)

## 2016-11-17 LAB — HEMOGLOBIN A1C: Hgb A1c MFr Bld: 6.6 % — ABNORMAL HIGH (ref 4.6–6.5)

## 2016-11-17 LAB — LDL CHOLESTEROL, DIRECT: Direct LDL: 85 mg/dL

## 2016-11-17 MED ORDER — TRAMADOL HCL 50 MG PO TABS: 50.0000 mg | ORAL_TABLET | Freq: Two times a day (BID) | ORAL | 0 refills | Status: DC

## 2016-11-17 MED ORDER — METHOCARBAMOL 500 MG PO TABS: 500.0000 mg | ORAL_TABLET | Freq: Four times a day (QID) | ORAL | 5 refills | Status: DC | PRN

## 2016-11-17 NOTE — Patient Instructions (Addendum)
Shingrix is the new shingles vaccine is 2 shots over 6 months, check with insurance regarding coverage  Hypertension Hypertension, commonly called high blood pressure, is when the force of blood pumping through the arteries is too strong. The arteries are the blood vessels that carry blood from the heart throughout the body. Hypertension forces the heart to work harder to pump blood and may cause arteries to become narrow or stiff. Having untreated or uncontrolled hypertension can cause heart attacks, strokes, kidney disease, and other problems. A blood pressure reading consists of a higher number over a lower number. Ideally, your blood pressure should be below 120/80. The first ("top") number is called the systolic pressure. It is a measure of the pressure in your arteries as your heart beats. The second ("bottom") number is called the diastolic pressure. It is a measure of the pressure in your arteries as the heart relaxes. What are the causes? The cause of this condition is not known. What increases the risk? Some risk factors for high blood pressure are under your control. Others are not. Factors you can change  Smoking.  Having type 2 diabetes mellitus, high cholesterol, or both.  Not getting enough exercise or physical activity.  Being overweight.  Having too much fat, sugar, calories, or salt (sodium) in your diet.  Drinking too much alcohol. Factors that are difficult or impossible to change  Having chronic kidney disease.  Having a family history of high blood pressure.  Age. Risk increases with age.  Race. You may be at higher risk if you are African-American.  Gender. Men are at higher risk than women before age 78. After age 53, women are at higher risk than men.  Having obstructive sleep apnea.  Stress. What are the signs or symptoms? Extremely high blood pressure (hypertensive crisis) may cause:  Headache.  Anxiety.  Shortness of  breath.  Nosebleed.  Nausea and vomiting.  Severe chest pain.  Jerky movements you cannot control (seizures).  How is this diagnosed? This condition is diagnosed by measuring your blood pressure while you are seated, with your arm resting on a surface. The cuff of the blood pressure monitor will be placed directly against the skin of your upper arm at the level of your heart. It should be measured at least twice using the same arm. Certain conditions can cause a difference in blood pressure between your right and left arms. Certain factors can cause blood pressure readings to be lower or higher than normal (elevated) for a short period of time:  When your blood pressure is higher when you are in a health care provider's office than when you are at home, this is called white coat hypertension. Most people with this condition do not need medicines.  When your blood pressure is higher at home than when you are in a health care provider's office, this is called masked hypertension. Most people with this condition may need medicines to control blood pressure.  If you have a high blood pressure reading during one visit or you have normal blood pressure with other risk factors:  You may be asked to return on a different day to have your blood pressure checked again.  You may be asked to monitor your blood pressure at home for 1 week or longer.  If you are diagnosed with hypertension, you may have other blood or imaging tests to help your health care provider understand your overall risk for other conditions. How is this treated? This condition is treated  by making healthy lifestyle changes, such as eating healthy foods, exercising more, and reducing your alcohol intake. Your health care provider may prescribe medicine if lifestyle changes are not enough to get your blood pressure under control, and if:  Your systolic blood pressure is above 130.  Your diastolic blood pressure is above  80.  Your personal target blood pressure may vary depending on your medical conditions, your age, and other factors. Follow these instructions at home: Eating and drinking  Eat a diet that is high in fiber and potassium, and low in sodium, added sugar, and fat. An example eating plan is called the DASH (Dietary Approaches to Stop Hypertension) diet. To eat this way: ? Eat plenty of fresh fruits and vegetables. Try to fill half of your plate at each meal with fruits and vegetables. ? Eat whole grains, such as whole wheat pasta, brown rice, or whole grain bread. Fill about one quarter of your plate with whole grains. ? Eat or drink low-fat dairy products, such as skim milk or low-fat yogurt. ? Avoid fatty cuts of meat, processed or cured meats, and poultry with skin. Fill about one quarter of your plate with lean proteins, such as fish, chicken without skin, beans, eggs, and tofu. ? Avoid premade and processed foods. These tend to be higher in sodium, added sugar, and fat.  Reduce your daily sodium intake. Most people with hypertension should eat less than 1,500 mg of sodium a day.  Limit alcohol intake to no more than 1 drink a day for nonpregnant women and 2 drinks a day for men. One drink equals 12 oz of beer, 5 oz of wine, or 1 oz of hard liquor. Lifestyle  Work with your health care provider to maintain a healthy body weight or to lose weight. Ask what an ideal weight is for you.  Get at least 30 minutes of exercise that causes your heart to beat faster (aerobic exercise) most days of the week. Activities may include walking, swimming, or biking.  Include exercise to strengthen your muscles (resistance exercise), such as pilates or lifting weights, as part of your weekly exercise routine. Try to do these types of exercises for 30 minutes at least 3 days a week.  Do not use any products that contain nicotine or tobacco, such as cigarettes and e-cigarettes. If you need help quitting, ask  your health care provider.  Monitor your blood pressure at home as told by your health care provider.  Keep all follow-up visits as told by your health care provider. This is important. Medicines  Take over-the-counter and prescription medicines only as told by your health care provider. Follow directions carefully. Blood pressure medicines must be taken as prescribed.  Do not skip doses of blood pressure medicine. Doing this puts you at risk for problems and can make the medicine less effective.  Ask your health care provider about side effects or reactions to medicines that you should watch for. Contact a health care provider if:  You think you are having a reaction to a medicine you are taking.  You have headaches that keep coming back (recurring).  You feel dizzy.  You have swelling in your ankles.  You have trouble with your vision. Get help right away if:  You develop a severe headache or confusion.  You have unusual weakness or numbness.  You feel faint.  You have severe pain in your chest or abdomen.  You vomit repeatedly.  You have trouble breathing. Summary  Hypertension  is when the force of blood pumping through your arteries is too strong. If this condition is not controlled, it may put you at risk for serious complications.  Your personal target blood pressure may vary depending on your medical conditions, your age, and other factors. For most people, a normal blood pressure is less than 120/80.  Hypertension is treated with lifestyle changes, medicines, or a combination of both. Lifestyle changes include weight loss, eating a healthy, low-sodium diet, exercising more, and limiting alcohol. This information is not intended to replace advice given to you by your health care provider. Make sure you discuss any questions you have with your health care provider. Document Released: 04/06/2005 Document Revised: 03/04/2016 Document Reviewed: 03/04/2016 Elsevier  Interactive Patient Education  [employer].

## 2016-11-17 NOTE — Progress Notes (Signed)
Subjective:  I acted as a Education administrator for Dr. Charlett Blake. [name], [place]  Patient ID: Brent [name], male    DOB: February 12, 1956, 61 y.o.   MRN: [id number]  No chief complaint on file.   HPI  Patient is in today for a 6 month follow up. Polyuria polydipsia and does believe his sugars have been well controlled. No recent febrile illness or hospitalization. He continues to struggle with daily neck, back, knee, ankle and hip pain. Pain is tolerable with his medications and he tries to stay as active as possible. Denies CP/palp/SOB/HA/congestion/fevers/GI or GU c/o. Taking meds as prescribed  Patient Care Team: [name], MD as PCP - General (Family Medicine) Rana Snare, MD as Consulting Physician (Urology) Irene Shipper, MD as Consulting Physician (Gastroenterology) Rigoberto Noel, MD as Consulting Physician (Pulmonary Disease) Larey Dresser, MD as Consulting Physician (Cardiology) Paralee Cancel, MD as Consulting Physician (Orthopedic Surgery)   Past Medical History:  Diagnosis Date  . Acid reflux disease   . ACID REFLUX DISEASE 07/05/2007  . Anemia   . Atherosclerosis   . Benign neoplasm of colon 07/05/2007  . Bilateral hip pain 10/05/2016  . Breast pain, left 11/17/2011  . Chest pain    a. Reportedly negative dobut echo performed prior to gastric bypass in 03/2011;  b. 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 12/2011 Cath: LM nl, LAD 50p, D1 41m, LCX min irregs, OM3 30, RCA 25p, 72m (FFR 0.99->0.89), PDA 30, EF 65%, Med Rx.  . COLONIC POLYPS, HX OF 10/29/2008  . Diarrhea 06/13/2010  . Diverticulosis of colon   . DIVERTICULOSIS, COLON 10/29/2008  . DM (diabetes mellitus), type 2, uncontrolled ([place])   . GI bleed   . Gout 03/04/2013  . Hearing loss 05/24/2013   Previous audiology evaluation completely.  [name] HTN (hypertension) 07/14/2010  . Hx of colonic polyps   . HYPERSOMNIA, ASSOCIATED WITH SLEEP APNEA 07/26/2008  . Hypertension 07/14/2010  . 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 Knee pain,  left 10/10/2010  . Morbid obesity ([place]) 03/27/2010   a. 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  [name] Neck pain 03/2015  . Other and unspecified hyperlipidemia 11/16/2012  . Preventative health care 11/17/2011  . Renal stone 11/2013  . Sleep apnea    a. CPAP  . Tear of meniscus of left knee 2012    Past Surgical History:  Procedure Laterality Date  . ANKLE SURGERY  1994  . CARDIAC CATHETERIZATION     denies any chest pain in the past 2 years  . COLONOSCOPY    . colonoscopy polyps    . ESOPHAGOGASTRODUODENOSCOPY N/A 03/27/2013   Procedure: ESOPHAGOGASTRODUODENOSCOPY (EGD);  Surgeon: Irene Shipper, MD;  Location: [facility];  Service: Endoscopy;  Laterality: N/A;  . GASTRIC BYPASS    . HERNIA REPAIR    . KNEE ARTHROSCOPY  11/06/10   Left, torn meniscus (repaired)  . LEFT HEART CATHETERIZATION WITH CORONARY ANGIOGRAM N/A 12/23/2011   Procedure: LEFT HEART CATHETERIZATION WITH CORONARY ANGIOGRAM;  Surgeon: Minus Breeding, MD;  Location: [facility] CATH [facility];  Service: Cardiovascular;  Laterality: N/A;  . right knee arthroscopy Right 07/05/14   Dr. Hart Robinsons, [place].  . TONSILLECTOMY  age 54  . TOTAL KNEE ARTHROPLASTY Right 01/20/2016   Procedure: RIGHT TOTAL KNEE ARTHROPLASTY;  Surgeon: Paralee Cancel, MD;  Location: [facility];  Service: Orthopedics;  Laterality: Right;  . UPPER GI ENDOSCOPY  03/27/13    Family History  Problem Relation Age of Onset  .  Diabetes Mother   . Hypertension Mother   . Stroke Mother   . Hyperlipidemia Mother   . Hypertension Father   . Colon polyps Father   . Heart attack Father 63  . Stroke Father   . Heart attack Brother   . Diabetes Brother   . Heart disease Brother   . Heart attack Brother        Multiple  . Diabetes Brother   . Other Brother        heart problems  . Heart disease Brother   . Diabetes Sister   . Obesity Brother   . Diabetes Brother   . Heart disease Brother   . Hypertension Maternal Grandmother   . ADD / ADHD Daughter   . Heart disease Brother     . Stomach cancer Neg Hx   . Colon cancer Neg Hx   . Esophageal cancer Neg Hx   . Rectal cancer Neg Hx     Social History   Social History  . Marital status: Married    Spouse name: N/A  . Number of children: 2  . Years of education: N/A   Occupational History  . [employer]   Social History Main Topics  . Smoking status: Former Smoker    Packs/day: 1.50    Years: 20.00    Types: Cigarettes    Quit date: 04/21/1991  . Smokeless tobacco: Never Used  . Alcohol use 1.8 oz/week    3 Cans of beer per week     Comment: social  . Drug use: No  . Sexual activity: Yes   Other Topics Concern  . Not on file   Social History Narrative   Lives with wife in [place].  Does not routinely exercise.    Outpatient Medications Prior to Visit  Medication Sig Dispense Refill  . Cholecalciferol (VITAMIN D-3) 5000 UNITS TABS Take 1 tablet by mouth every morning.     . Cyanocobalamin (VITAMIN B-12) 1000 MCG SUBL Place 1 each under the tongue every morning.     [name] EPINEPHrine (EPIPEN 2-PAK) 0.3 mg/0.3 mL IJ SOAJ injection Inject 0.3 mLs (0.3 mg total) into the muscle once. 3 Device 1  . fenofibrate 160 MG tablet TAKE 1 TABLET DAILY 90 tablet 1  . glucose blood test strip Use as directed once daily to check blood sugar.  Diagnosis code E11.9 100 each 3  . Krill Oil 1000 MG CAPS Take 1 tablet by mouth 2 (two) times daily.     [name] LIVALO 1 MG TABS TAKE 1 TABLET DAILY 90 tablet 1  . metFORMIN (GLUCOPHAGE) 500 MG tablet TAKE 1 TABLET FOUR TIMES A DAY 360 tablet 1  . metoprolol tartrate (LOPRESSOR) 25 MG tablet TAKE ONE-HALF (1/2) TABLET TWICE A DAY 90 tablet 1  . mometasone (ELOCON) 0.1 % cream Apply 1 application topically daily. 50 g 1  . Multiple Vitamin (MULTIVITAMIN) tablet Take 1 tablet by mouth 2 (two) times daily. BARIATRIC VIT    . pantoprazole (PROTONIX) 40 MG tablet TAKE 1 TABLET TWICE A DAY 180 tablet 1  . methocarbamol (ROBAXIN) 500 MG tablet Take 1 tablet (500 mg  total) by mouth every 6 (six) hours as needed for muscle spasms. 40 tablet 3  . traMADol (ULTRAM) 50 MG tablet Take 1 tablet (50 mg total) by mouth 2 (two) times daily. 30 tablet 0   No facility-administered medications prior to visit.     Allergies  Allergen Reactions  . Bee Venom Anaphylaxis  .  Lipitor [Atorvastatin]     Myalgias, memory changes  . Morphine     hyperactive  . Hydrocodone Itching  . Pravastatin Other (See Comments)    Made joints hurt. --also simvastatin     Review of Systems  Constitutional: Negative for fever and malaise/fatigue.  HENT: Negative for congestion.   Eyes: Negative for blurred vision.  Respiratory: Negative for shortness of breath.   Cardiovascular: Negative for chest pain, palpitations and leg swelling.  Gastrointestinal: Negative for abdominal pain, blood in stool and nausea.  Genitourinary: Negative for dysuria and frequency.  Musculoskeletal: Positive for back pain, joint pain and neck pain. Negative for falls.  Skin: Negative for rash.  Neurological: Negative for dizziness, loss of consciousness and headaches.  Endo/Heme/Allergies: Negative for environmental allergies.  Psychiatric/Behavioral: Negative for depression. The patient is not nervous/anxious.        Objective:    Physical Exam  Constitutional: He is oriented to person, place, and time. He appears well-developed and well-nourished. No distress.  HENT:  Head: Normocephalic and atraumatic.  Eyes: Conjunctivae are normal.  Neck: Neck supple. No thyromegaly present.  Cardiovascular: Normal rate, regular rhythm and normal heart sounds.   No murmur heard. Pulmonary/Chest: Effort normal and breath sounds normal. No respiratory distress. He has no wheezes.  Abdominal: Soft. Bowel sounds are normal. He exhibits no mass. There is no tenderness.  Musculoskeletal: He exhibits no edema.  Lymphadenopathy:    He has no cervical adenopathy.  Neurological: He is alert and oriented to  person, place, and time.  Skin: Skin is warm and dry.  Psychiatric: He has a normal mood and affect. His behavior is normal.    There were no vitals taken for this visit. Wt Readings from Last 3 Encounters:  11/03/16 247 lb 9.6 oz (112.3 kg)  10/05/16 249 lb (112.9 kg)  09/10/16 247 lb 6.4 oz (112.2 kg)   BP Readings from Last 3 Encounters:  11/03/16 128/66  10/05/16 121/67  09/10/16 (!) 142/76     Immunization History  Administered Date(s) Administered  . Influenza Split 02/11/2011, 02/02/2012  . Influenza Whole 01/18/2010, 03/04/2013  . Influenza,inj,Quad PF,36+ Mos 05/08/2014, 04/02/2015  . Pneumococcal Conjugate-13 05/08/2014  . Pneumococcal Polysaccharide-23 05/27/2015  . Td 04/20/1996, 10/29/2008  . Tdap 03/01/2012  . Zoster 12/02/2015    Health Maintenance  Topic Date Due  . FOOT EXAM  11/15/2015  . OPHTHALMOLOGY EXAM  07/21/2016  . INFLUENZA VACCINE  11/18/2016  . URINE MICROALBUMIN  12/01/2016  . HEMOGLOBIN A1C  05/20/2017  . PNEUMOCOCCAL POLYSACCHARIDE VACCINE (2) 05/26/2020  . COLONOSCOPY  07/27/2021  . TETANUS/TDAP  03/01/2022  . Hepatitis C Screening  Completed  . HIV Screening  Completed    Lab Results  Component Value Date   WBC 3.4 (L) 11/17/2016   HGB 11.9 (L) 11/17/2016   HCT 36.9 (L) 11/17/2016   PLT 197.0 11/17/2016   GLUCOSE 194 (H) 11/17/2016   CHOL 155 11/17/2016   TRIG 208.0 (H) 11/17/2016   HDL 50.40 11/17/2016   LDLDIRECT 85.0 11/17/2016   LDLCALC 57 05/25/2016   ALT 17 11/17/2016   AST 21 11/17/2016   NA 141 11/17/2016   K 4.7 11/17/2016   CL 107 11/17/2016   CREATININE 0.85 11/17/2016   BUN 15 11/17/2016   CO2 25 11/17/2016   TSH 1.38 11/17/2016   PSA 0.84 11/15/2014   INR 1.13 03/29/2013   HGBA1C 6.6 (H) 11/17/2016   MICROALBUR 1.1 12/02/2015    Lab Results  Component Value  Date   TSH 1.38 11/17/2016   Lab Results  Component Value Date   WBC 3.4 (L) 11/17/2016   HGB 11.9 (L) 11/17/2016   HCT 36.9 (L) 11/17/2016     MCV 85.3 11/17/2016   PLT 197.0 11/17/2016   Lab Results  Component Value Date   NA 141 11/17/2016   K 4.7 11/17/2016   CO2 25 11/17/2016   GLUCOSE 194 (H) 11/17/2016   BUN 15 11/17/2016   CREATININE 0.85 11/17/2016   BILITOT 0.4 11/17/2016   ALKPHOS 39 11/17/2016   AST 21 11/17/2016   ALT 17 11/17/2016   PROT 6.6 11/17/2016   ALBUMIN 4.3 11/17/2016   CALCIUM 9.2 11/17/2016   ANIONGAP 8 01/21/2016   GFR 97.36 11/17/2016   Lab Results  Component Value Date   CHOL 155 11/17/2016   Lab Results  Component Value Date   HDL 50.40 11/17/2016   Lab Results  Component Value Date   LDLCALC 57 05/25/2016   Lab Results  Component Value Date   TRIG 208.0 (H) 11/17/2016   Lab Results  Component Value Date   CHOLHDL 3 11/17/2016   Lab Results  Component Value Date   HGBA1C 6.6 (H) 11/17/2016         Assessment & Plan:   Problem List Items Addressed This Visit    Morbid obesity ([place])    Encouraged DASH diet, decrease po intake and increase exercise as tolerated. Needs 7-8 hours of sleep nightly. Avoid trans fats, eat small, frequent meals every 4-5 hours with lean proteins, complex carbs and healthy fats. Minimize simple carbs, consider bariatric referral      T2DM (type 2 diabetes mellitus) ([place])    hgba1c acceptable, minimize simple carbs. Increase exercise as tolerated. Continue current meds      Essential hypertension    Well controlled, no changes to meds. Encouraged heart healthy diet such as the DASH diet and exercise as tolerated.       Chronic pain of both knees    Continue to stay active, attempt weight loss, pain meds refilled      Relevant Medications   methocarbamol (ROBAXIN) 500 MG tablet   traMADol (ULTRAM) 50 MG tablet   Hyperlipidemia, mixed    Encouraged heart healthy diet, increase exercise, avoid trans fats, consider a krill oil cap daily      Gastroesophageal reflux disease    Avoid offending foods, start probiotics. Do not eat large  meals in late evening and consider raising head of bed. Continue current meds      Vitamin D deficiency   Vitamin B12 deficiency      I am having Mr. Candee maintain his multivitamin, Vitamin B-12, Vitamin D-3, Krill Oil, glucose blood, EPINEPHrine, metoprolol tartrate, metFORMIN, LIVALO, fenofibrate, pantoprazole, mometasone, methocarbamol, and traMADol.  Meds ordered this encounter  Medications  . DISCONTD: traMADol (ULTRAM) 50 MG tablet    Sig: Take 1 tablet (50 mg total) by mouth 2 (two) times daily.    Dispense:  60 tablet    Refill:  0  . methocarbamol (ROBAXIN) 500 MG tablet    Sig: Take 1 tablet (500 mg total) by mouth every 6 (six) hours as needed for muscle spasms.    Dispense:  120 tablet    Refill:  5  . traMADol (ULTRAM) 50 MG tablet    Sig: Take 1 tablet (50 mg total) by mouth 2 (two) times daily.    Dispense:  60 tablet    Refill:  0  CMA served as Education administrator during this visit. History, Physical and Plan performed by medical provider. Documentation and orders reviewed and attested to.  [name], MD

## 2016-11-22 NOTE — Assessment & Plan Note (Signed)
Encouraged heart healthy diet, increase exercise, avoid trans fats, consider a krill oil cap daily 

## 2016-11-22 NOTE — Assessment & Plan Note (Signed)
Avoid offending foods, start probiotics. Do not eat large meals in late evening and consider raising head of bed. Continue current meds 

## 2016-11-22 NOTE — Assessment & Plan Note (Signed)
Continue to stay active, attempt weight loss, pain meds refilled

## 2016-11-22 NOTE — Assessment & Plan Note (Signed)
Well controlled, no changes to meds. Encouraged heart healthy diet such as the DASH diet and exercise as tolerated.  °

## 2016-11-22 NOTE — Assessment & Plan Note (Signed)
Encouraged DASH diet, decrease po intake and increase exercise as tolerated. Needs 7-8 hours of sleep nightly. Avoid trans fats, eat small, frequent meals every 4-5 hours with lean proteins, complex carbs and healthy fats. Minimize simple carbs, consider bariatric referral 

## 2016-11-22 NOTE — Assessment & Plan Note (Signed)
hgba1c acceptable, minimize simple carbs. Increase exercise as tolerated. Continue current meds 

## 2016-11-24 ENCOUNTER — Other Ambulatory Visit: Payer: Self-pay | Admitting: Family Medicine

## 2016-12-14 ENCOUNTER — Encounter: Payer: Self-pay | Admitting: Family Medicine

## 2016-12-14 MED ORDER — GLUCOSE BLOOD VI STRP: ORAL_STRIP | 12 refills | Status: DC

## 2016-12-15 ENCOUNTER — Other Ambulatory Visit

## 2017-01-02 ENCOUNTER — Other Ambulatory Visit: Payer: Self-pay | Admitting: Family Medicine

## 2017-01-05 ENCOUNTER — Encounter: Payer: Self-pay | Admitting: Family Medicine

## 2017-01-05 ENCOUNTER — Telehealth: Payer: Self-pay

## 2017-01-05 NOTE — Telephone Encounter (Signed)
Ok to refill will be in at 3:30 to see one patient so can sign then most likely

## 2017-01-05 NOTE — Telephone Encounter (Signed)
Pt sent MyChart message requesting a refill for Tramadol stating that OTC meds for relief is not helpful  Requesting: Tramadol Contract: Yes UDS: 4.19.18 low-risk next screen 10.19.18 Last OV: 7.31.18 Next OV: 1.29.19 Last Refill: 7.31.18   Please advise

## 2017-01-06 MED ORDER — TRAMADOL HCL 50 MG PO TABS: 50.0000 mg | ORAL_TABLET | Freq: Two times a day (BID) | ORAL | 0 refills | Status: DC

## 2017-01-06 NOTE — Telephone Encounter (Signed)
Printed per SB/will fax when signed/thx dmf

## 2017-01-31 ENCOUNTER — Other Ambulatory Visit: Payer: Self-pay | Admitting: Family Medicine

## 2017-02-08 ENCOUNTER — Encounter: Payer: Self-pay | Admitting: Family Medicine

## 2017-02-09 ENCOUNTER — Encounter: Payer: Self-pay | Admitting: Family Medicine

## 2017-02-09 NOTE — Telephone Encounter (Signed)
Pt is requesting refill on Tramadol.  Last OV: 11/17/2016 Last Fill: 01/06/2017 #60 and 0RF UDS: 08/06/2016 Low risk  Please advise.

## 2017-02-09 NOTE — Telephone Encounter (Signed)
Pt called in to follow up on refill request for Tramadol.    Please advise.

## 2017-02-10 ENCOUNTER — Telehealth: Payer: Self-pay | Admitting: Family Medicine

## 2017-02-10 ENCOUNTER — Other Ambulatory Visit: Payer: Self-pay | Admitting: Family Medicine

## 2017-02-10 NOTE — Telephone Encounter (Signed)
Requesting:tramadol Contract:yes HQU:[id number] nx scrn 02/05/17 Last OV:11/17/16 Next OV:05/18/17 Last Refill:01/06/17   #[id number]   Please advise

## 2017-02-10 NOTE — Telephone Encounter (Signed)
Tel [phone]  Pt came in to office wanting to know the status of rx since pt only has 2 pills left for tonight and one for tomorrow morning. Pt is needing the rx sent ASAP. Please advise.

## 2017-02-10 NOTE — Telephone Encounter (Signed)
Pt dropped off document for provider to see and have on pt's chart (3 pages medical history report) Document put at front office tray.

## 2017-02-11 NOTE — Telephone Encounter (Signed)
Called pt and left detailed message that his rx was faxed to [facility].

## 2017-02-11 NOTE — Telephone Encounter (Signed)
rx faxed

## 2017-03-05 ENCOUNTER — Other Ambulatory Visit: Payer: Self-pay | Admitting: Family Medicine

## 2017-03-05 NOTE — Telephone Encounter (Signed)
Patient called and stts he needs a refill on his Tramadol, Patient would like RX to be sent to [facility].   Please call patient back at [phone]

## 2017-03-08 ENCOUNTER — Other Ambulatory Visit: Payer: Self-pay | Admitting: Family Medicine

## 2017-03-08 MED ORDER — TRAMADOL HCL 50 MG PO TABS: 50.0000 mg | ORAL_TABLET | Freq: Two times a day (BID) | ORAL | 0 refills | Status: DC

## 2017-03-08 NOTE — Telephone Encounter (Signed)
Requesting:tramadol Contract yes UDSlow risk next scrn 02/05/17 Last OV 11/17/16 Last Refill: 02/10/17   #[id number]  Please Advise

## 2017-03-08 NOTE — Telephone Encounter (Signed)
Pt called in to follow up on refill request. Pt says that he know that he is requesting a little to early but he will be giving out over the holiday. Pt would like to pick up as soon as possible.

## 2017-03-09 NOTE — Telephone Encounter (Signed)
rx faxed to pharmacy

## 2017-04-08 ENCOUNTER — Encounter: Payer: Self-pay | Admitting: Family Medicine

## 2017-04-09 ENCOUNTER — Other Ambulatory Visit: Payer: Self-pay | Admitting: Family Medicine

## 2017-04-09 ENCOUNTER — Encounter: Payer: Self-pay | Admitting: Family Medicine

## 2017-04-09 MED ORDER — TRAMADOL HCL 50 MG PO TABS: 50.0000 mg | ORAL_TABLET | Freq: Two times a day (BID) | ORAL | 0 refills | Status: DC

## 2017-04-09 NOTE — Telephone Encounter (Signed)
Pt is requesting refill on tramadol.  Last OV: 11/17/2016 Last Fill: 03/08/2017 #60 and 0RF UDS: 08/06/2016 Low risk  Please advise.

## 2017-04-12 ENCOUNTER — Other Ambulatory Visit: Payer: Self-pay | Admitting: Family Medicine

## 2017-04-13 ENCOUNTER — Other Ambulatory Visit: Payer: Self-pay | Admitting: Family Medicine

## 2017-04-20 HISTORY — PX: ROTATOR CUFF REPAIR: SHX139

## 2017-05-11 ENCOUNTER — Other Ambulatory Visit: Payer: Self-pay | Admitting: Family Medicine

## 2017-05-13 NOTE — Telephone Encounter (Signed)
Patient ran out of the medication on Mon 05/10/17.

## 2017-05-13 NOTE — Telephone Encounter (Signed)
Requesting:tramadol Contract:yes  UDS:low risk next next screen 02/05/17 Last OV:11/17/16 Next OV:06/08/17 Last Refill:04/09/17   #[id number]   Please advise

## 2017-05-18 ENCOUNTER — Ambulatory Visit: Admitting: Family Medicine

## 2017-05-25 ENCOUNTER — Ambulatory Visit: Admitting: Family Medicine

## 2017-06-08 ENCOUNTER — Ambulatory Visit: Admitting: Family Medicine

## 2017-06-10 ENCOUNTER — Other Ambulatory Visit: Payer: Self-pay | Admitting: Family Medicine

## 2017-06-10 ENCOUNTER — Encounter: Payer: Self-pay | Admitting: Family Medicine

## 2017-06-10 NOTE — Telephone Encounter (Signed)
Requesting:tramadol Contract:yes UDS:low risk next screen 02/05/17 Last OV: 11/17/16 Next OV:07/08/17 Last Refill:05/13/17  #[id number] Database:06/10/17 no concerns    Please advise

## 2017-06-14 ENCOUNTER — Telehealth: Payer: Self-pay | Admitting: *Deleted

## 2017-06-14 NOTE — Telephone Encounter (Signed)
Received Physician Orders from [facility] for [facility] Sleep Supplies; forwarded to provider/SLS 02/25

## 2017-06-16 NOTE — Telephone Encounter (Signed)
Orders signed and faxed to [facility] at [phone]. Form sent for scanning.  

## 2017-06-25 ENCOUNTER — Encounter: Payer: Self-pay | Admitting: Family Medicine

## 2017-06-25 ENCOUNTER — Ambulatory Visit (INDEPENDENT_AMBULATORY_CARE_PROVIDER_SITE_OTHER): Admitting: Family Medicine

## 2017-06-25 VITALS — BP 142/80 | HR 112 | Temp 98.1°F | Resp 18 | Wt 253.0 lb

## 2017-06-25 DIAGNOSIS — R350 Frequency of micturition: Secondary | ICD-10-CM | POA: Diagnosis not present

## 2017-06-25 DIAGNOSIS — M1A9XX0 Chronic gout, unspecified, without tophus (tophi): Secondary | ICD-10-CM | POA: Diagnosis not present

## 2017-06-25 DIAGNOSIS — M25562 Pain in left knee: Secondary | ICD-10-CM

## 2017-06-25 DIAGNOSIS — G4733 Obstructive sleep apnea (adult) (pediatric): Secondary | ICD-10-CM

## 2017-06-25 DIAGNOSIS — Z79899 Other long term (current) drug therapy: Secondary | ICD-10-CM | POA: Diagnosis not present

## 2017-06-25 DIAGNOSIS — Z9989 Dependence on other enabling machines and devices: Secondary | ICD-10-CM

## 2017-06-25 DIAGNOSIS — I10 Essential (primary) hypertension: Secondary | ICD-10-CM

## 2017-06-25 DIAGNOSIS — M25561 Pain in right knee: Secondary | ICD-10-CM | POA: Diagnosis not present

## 2017-06-25 DIAGNOSIS — E559 Vitamin D deficiency, unspecified: Secondary | ICD-10-CM

## 2017-06-25 DIAGNOSIS — E782 Mixed hyperlipidemia: Secondary | ICD-10-CM | POA: Diagnosis not present

## 2017-06-25 DIAGNOSIS — M791 Myalgia, unspecified site: Secondary | ICD-10-CM | POA: Diagnosis not present

## 2017-06-25 DIAGNOSIS — M542 Cervicalgia: Secondary | ICD-10-CM

## 2017-06-25 DIAGNOSIS — Z9884 Bariatric surgery status: Secondary | ICD-10-CM

## 2017-06-25 DIAGNOSIS — G8929 Other chronic pain: Secondary | ICD-10-CM

## 2017-06-25 DIAGNOSIS — E118 Type 2 diabetes mellitus with unspecified complications: Secondary | ICD-10-CM

## 2017-06-25 DIAGNOSIS — E538 Deficiency of other specified B group vitamins: Secondary | ICD-10-CM

## 2017-06-25 DIAGNOSIS — R790 Abnormal level of blood mineral: Secondary | ICD-10-CM

## 2017-06-25 LAB — COMPREHENSIVE METABOLIC PANEL
ALT: 16 U/L (ref 0–53)
AST: 19 U/L (ref 0–37)
Albumin: 4.4 g/dL (ref 3.5–5.2)
Alkaline Phosphatase: 34 U/L — ABNORMAL LOW (ref 39–117)
BUN: 12 mg/dL (ref 6–23)
CO2: 29 mEq/L (ref 19–32)
Calcium: 9.5 mg/dL (ref 8.4–10.5)
Chloride: 104 mEq/L (ref 96–112)
Creatinine, Ser: 0.94 mg/dL (ref 0.40–1.50)
GFR: 86.51 mL/min (ref >60.00)
Glucose, Bld: 153 mg/dL — ABNORMAL HIGH (ref 70–99)
Potassium: 4.8 mEq/L (ref 3.5–5.1)
Sodium: 140 mEq/L (ref 135–145)
Total Bilirubin: 0.5 mg/dL (ref 0.2–1.2)
Total Protein: 6.5 g/dL (ref 6.0–8.3)

## 2017-06-25 LAB — URIC ACID: Uric Acid, Serum: 6.4 mg/dL (ref 4.0–7.8)

## 2017-06-25 LAB — MAGNESIUM: Magnesium: 1.3 mg/dL — ABNORMAL LOW (ref 1.5–2.5)

## 2017-06-25 LAB — PSA: PSA: 1.55 ng/mL (ref 0.10–4.00)

## 2017-06-25 LAB — CBC
HCT: 37.2 % — ABNORMAL LOW (ref 39.0–52.0)
Hemoglobin: 12.1 g/dL — ABNORMAL LOW (ref 13.0–17.0)
MCHC: 32.4 g/dL (ref 30.0–36.0)
MCV: 82.6 fl (ref 78.0–100.0)
Platelets: 231 10*3/uL (ref 150.0–400.0)
RBC: 4.5 Mil/uL (ref 4.22–5.81)
RDW: 15.1 % (ref 11.5–15.5)
WBC: 4.3 10*3/uL (ref 4.0–10.5)

## 2017-06-25 LAB — LIPID PANEL
Cholesterol: 126 mg/dL (ref 0–200)
HDL: 55.8 mg/dL (ref >39.00)
LDL Cholesterol: 51 mg/dL (ref 0–99)
NonHDL: 69.85
Total CHOL/HDL Ratio: 2
Triglycerides: 94 mg/dL (ref 0.0–149.0)
VLDL: 18.8 mg/dL (ref 0.0–40.0)

## 2017-06-25 LAB — HEMOGLOBIN A1C: Hgb A1c MFr Bld: 6.5 % (ref 4.6–6.5)

## 2017-06-25 LAB — TSH: TSH: 1.56 u[IU]/mL (ref 0.35–4.50)

## 2017-06-25 LAB — VITAMIN D 25 HYDROXY (VIT D DEFICIENCY, FRACTURES): VITD: 25.78 ng/mL — ABNORMAL LOW (ref 30.00–100.00)

## 2017-06-25 LAB — VITAMIN B12: Vitamin B-12: 955 pg/mL — ABNORMAL HIGH (ref 211–911)

## 2017-06-25 NOTE — Assessment & Plan Note (Signed)
Takes a daily Vitamin B 12

## 2017-06-25 NOTE — Progress Notes (Signed)
Subjective:  I acted as a Education administrator for Dr. Charlett Blake. [name], [place]  Patient ID: Brent [name], male    DOB: 09/07/1955, 62 y.o.   MRN: [id number]  No chief complaint on file.   HPI  Patient is in today for a follow up and he is feeling well today. Continues to struggle with chronic pain in knees, back, neck and more. Is working with Dr Alvan Dame about the possibility of a revision of his right TKR due to his instability and pain. His patella is not stable and causes pain and debility. No recent febrile illness or hospitalizaiotns. No polyuria or polydipsia. Denies CP/palp/SOB/HA/congestion/fevers/GI or GU c/o. Taking meds as prescribed  Patient Care Team: [name], MD as PCP - General (Family Medicine) Rana Snare, MD as Consulting Physician (Urology) Irene Shipper, MD as Consulting Physician (Gastroenterology) Rigoberto Noel, MD as Consulting Physician (Pulmonary Disease) Larey Dresser, MD as Consulting Physician (Cardiology) Paralee Cancel, MD as Consulting Physician (Orthopedic Surgery)   Past Medical History:  Diagnosis Date  . Acid reflux disease   . ACID REFLUX DISEASE 07/05/2007  . Anemia   . Atherosclerosis   . Benign neoplasm of colon 07/05/2007  . Bilateral hip pain 10/05/2016  . Breast pain, left 11/17/2011  . Chest pain    a. Reportedly negative dobut echo performed prior to gastric bypass in 03/2011;  b. 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 12/2011 Cath: LM nl, LAD 50p, D1 39m, LCX min irregs, OM3 30, RCA 25p, 65m (FFR 0.99->0.89), PDA 30, EF 65%, Med Rx.  . COLONIC POLYPS, HX OF 10/29/2008  . Diarrhea 06/13/2010  . Diverticulosis of colon   . DIVERTICULOSIS, COLON 10/29/2008  . DM (diabetes mellitus), type 2, uncontrolled ([place])   . GI bleed   . Gout 03/04/2013  . Hearing loss 05/24/2013   Previous audiology evaluation completely.  [name] HTN (hypertension) 07/14/2010  . Hx of colonic polyps   . HYPERSOMNIA, ASSOCIATED WITH SLEEP APNEA 07/26/2008  . Hypertension 07/14/2010    . 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 Knee pain, left 10/10/2010  . Morbid obesity ([place]) 03/27/2010   a. 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  [name] Neck pain 03/2015  . Other and unspecified hyperlipidemia 11/16/2012  . Preventative health care 11/17/2011  . Renal stone 11/2013  . Sleep apnea    a. CPAP  . Tear of meniscus of left knee 2012    Past Surgical History:  Procedure Laterality Date  . ANKLE SURGERY  1994  . CARDIAC CATHETERIZATION     denies any chest pain in the past 2 years  . COLONOSCOPY    . colonoscopy polyps    . ESOPHAGOGASTRODUODENOSCOPY N/A 03/27/2013   Procedure: ESOPHAGOGASTRODUODENOSCOPY (EGD);  Surgeon: Irene Shipper, MD;  Location: [facility];  Service: Endoscopy;  Laterality: N/A;  . GASTRIC BYPASS    . HERNIA REPAIR    . KNEE ARTHROSCOPY  11/06/10   Left, torn meniscus (repaired)  . LEFT HEART CATHETERIZATION WITH CORONARY ANGIOGRAM N/A 12/23/2011   Procedure: LEFT HEART CATHETERIZATION WITH CORONARY ANGIOGRAM;  Surgeon: Minus Breeding, MD;  Location: [facility] CATH LAB;  Service: Cardiovascular;  Laterality: N/A;  . right knee arthroscopy Right 07/05/14   Dr. Hart Robinsons, [place].  . TONSILLECTOMY  age 26  . TOTAL KNEE ARTHROPLASTY Right 01/20/2016   Procedure: RIGHT TOTAL KNEE ARTHROPLASTY;  Surgeon: Paralee Cancel, MD;  Location: [facility];  Service: Orthopedics;  Laterality: Right;  . UPPER GI ENDOSCOPY  03/27/13    Family History  Problem Relation Age of Onset  . Diabetes Mother   . Hypertension Mother   . Stroke Mother   . Hyperlipidemia Mother   . Hypertension Father   . Colon polyps Father   . Heart attack Father 75  . Stroke Father   . Heart attack Brother   . Diabetes Brother   . Heart disease Brother   . Heart attack Brother        Multiple  . Diabetes Brother   . Other Brother        heart problems  . Heart disease Brother   . Diabetes Sister   . Obesity Brother   . Diabetes Brother   . Heart disease Brother   . Hypertension Maternal  Grandmother   . ADD / ADHD Daughter   . Heart disease Brother   . Stomach cancer Neg Hx   . Colon cancer Neg Hx   . Esophageal cancer Neg Hx   . Rectal cancer Neg Hx     Social History   Socioeconomic History  . Marital status: Married    Spouse name: Not on file  . Number of children: 2  . Years of education: Not on file  . Highest education level: Not on file  Social Needs  . Financial resource strain: Not on file  . Food insecurity - worry: Not on file  . Food insecurity - inability: Not on file  . Transportation needs - medical: Not on file  . Transportation needs - non-medical: Not on file  Occupational History  . Occupation: TEACHER    Employer: [employer]  Tobacco Use  . Smoking status: Former Smoker    Packs/day: 1.50    Years: 20.00    Pack years: 30.00    Types: Cigarettes    Last attempt to quit: 04/21/1991    Years since quitting: 26.2  . Smokeless tobacco: Never Used  Substance and Sexual Activity  . Alcohol use: Yes    Alcohol/week: 1.8 oz    Types: 3 Cans of beer per week    Comment: social  . Drug use: No  . Sexual activity: Yes  Other Topics Concern  . Not on file  Social History Narrative   Lives with wife in [place].  Does not routinely exercise.    Outpatient Medications Prior to Visit  Medication Sig Dispense Refill  . Cholecalciferol (VITAMIN D-3) 5000 UNITS TABS Take 1 tablet by mouth every morning.     . Cyanocobalamin (VITAMIN B-12) 1000 MCG SUBL Place 1 each under the tongue every morning.     [name] EPINEPHrine (EPIPEN 2-PAK) 0.3 mg/0.3 mL IJ SOAJ injection Inject 0.3 mLs (0.3 mg total) into the muscle once. 3 Device 1  . fenofibrate 160 MG tablet TAKE 1 TABLET DAILY 90 tablet 1  . glucose blood test strip Use as directed once daily to check blood sugar.  Diagnosis code E11.9 100 each 12  . Krill Oil 1000 MG CAPS Take 1 tablet by mouth 2 (two) times daily.     . metFORMIN (GLUCOPHAGE) 500 MG tablet TAKE 1 TABLET FOUR TIMES A DAY  360 tablet 1  . methocarbamol (ROBAXIN) 500 MG tablet Take 1 tablet (500 mg total) by mouth every 6 (six) hours as needed for muscle spasms. 120 tablet 5  . metoprolol tartrate (LOPRESSOR) 25 MG tablet TAKE ONE-HALF (1/2) TABLET TWICE A DAY 90 tablet 1  . mometasone (ELOCON) 0.1 % cream Apply 1 application  topically daily. 50 g 1  . Multiple Vitamin (MULTIVITAMIN) tablet Take 1 tablet by mouth 2 (two) times daily. BARIATRIC VIT    . pantoprazole (PROTONIX) 40 MG tablet TAKE 1 TABLET TWICE A DAY 180 tablet 1  . Pitavastatin Calcium (LIVALO) 1 MG TABS Take 1 tablet (1 mg total) by mouth daily. 90 tablet 1  . traMADol (ULTRAM) 50 MG tablet TAKE ONE TABLET BY MOUTH TWICE DAILY 60 tablet 0   No facility-administered medications prior to visit.     Allergies  Allergen Reactions  . Bee Venom Anaphylaxis  . Lipitor [Atorvastatin]     Myalgias, memory changes  . Morphine     hyperactive  . Hydrocodone Itching  . Pravastatin Other (See Comments)    Made joints hurt. --also simvastatin     ROS     Objective:    Physical Exam  BP (!) 142/80 (BP Location: Left Arm, Patient Position: Sitting, Cuff Size: Normal)   Pulse (!) 112   Temp 98.1 F (36.7 C) (Oral)   Resp 18   Wt 253 lb (114.8 kg)   SpO2 97%   BMI 38.47 kg/m  Wt Readings from Last 3 Encounters:  06/25/17 253 lb (114.8 kg)  11/03/16 247 lb 9.6 oz (112.3 kg)  10/05/16 249 lb (112.9 kg)   BP Readings from Last 3 Encounters:  06/25/17 (!) 142/80  11/03/16 128/66  10/05/16 121/67     Immunization History  Administered Date(s) Administered  . Influenza Split 02/11/2011, 02/02/2012  . Influenza Whole 01/18/2010, 03/04/2013  . Influenza,inj,Quad PF,6+ Mos 05/08/2014, 04/02/2015  . Pneumococcal Conjugate-13 05/08/2014  . Pneumococcal Polysaccharide-23 05/27/2015  . Td 04/20/1996, 10/29/2008  . Tdap 03/01/2012  . Zoster 12/02/2015    Health Maintenance  Topic Date Due  . FOOT EXAM  11/15/2015  . OPHTHALMOLOGY EXAM   07/21/2016  . URINE MICROALBUMIN  12/01/2016  . HEMOGLOBIN A1C  12/26/2017  . PNEUMOCOCCAL POLYSACCHARIDE VACCINE (2) 05/26/2020  . COLONOSCOPY  07/27/2021  . TETANUS/TDAP  03/01/2022  . INFLUENZA VACCINE  Completed  . Hepatitis C Screening  Completed  . HIV Screening  Completed    Lab Results  Component Value Date   WBC 4.3 06/25/2017   HGB 12.1 (L) 06/25/2017   HCT 37.2 (L) 06/25/2017   PLT 231.0 06/25/2017   GLUCOSE 153 (H) 06/25/2017   CHOL 126 06/25/2017   TRIG 94.0 06/25/2017   HDL 55.80 06/25/2017   LDLDIRECT 85.0 11/17/2016   LDLCALC 51 06/25/2017   ALT 16 06/25/2017   AST 19 06/25/2017   NA 140 06/25/2017   K 4.8 06/25/2017   CL 104 06/25/2017   CREATININE 0.94 06/25/2017   BUN 12 06/25/2017   CO2 29 06/25/2017   TSH 1.56 06/25/2017   PSA 1.55 06/25/2017   INR 1.13 03/29/2013   HGBA1C 6.5 06/25/2017   MICROALBUR 1.1 12/02/2015    Lab Results  Component Value Date   TSH 1.56 06/25/2017   Lab Results  Component Value Date   WBC 4.3 06/25/2017   HGB 12.1 (L) 06/25/2017   HCT 37.2 (L) 06/25/2017   MCV 82.6 06/25/2017   PLT 231.0 06/25/2017   Lab Results  Component Value Date   NA 140 06/25/2017   K 4.8 06/25/2017   CO2 29 06/25/2017   GLUCOSE 153 (H) 06/25/2017   BUN 12 06/25/2017   CREATININE 0.94 06/25/2017   BILITOT 0.5 06/25/2017   ALKPHOS 34 (L) 06/25/2017   AST 19 06/25/2017   ALT 16 06/25/2017  PROT 6.5 06/25/2017   ALBUMIN 4.4 06/25/2017   CALCIUM 9.5 06/25/2017   ANIONGAP 8 01/21/2016   GFR 86.51 06/25/2017   Lab Results  Component Value Date   CHOL 126 06/25/2017   Lab Results  Component Value Date   HDL 55.80 06/25/2017   Lab Results  Component Value Date   LDLCALC 51 06/25/2017   Lab Results  Component Value Date   TRIG 94.0 06/25/2017   Lab Results  Component Value Date   CHOLHDL 2 06/25/2017   Lab Results  Component Value Date   HGBA1C 6.5 06/25/2017         Assessment & Plan:   Problem List Items  Addressed This Visit    Morbid obesity ([place])    S/p bypass will check labs      T2DM (type 2 diabetes mellitus) ([place])    hgba1c acceptable, minimize simple carbs. Increase exercise as tolerated. Continue current meds. Sugars ranging from 70-140 fasting and post prandial      Relevant Orders   Hemoglobin A1c (Completed)   Essential hypertension    Well controlled, no changes to meds. Encouraged heart healthy diet such as the DASH diet and exercise as tolerated.       Relevant Orders   CBC (Completed)   TSH (Completed)   Comprehensive metabolic panel (Completed)   Chronic pain of both knees    Right knee unstable, patella shifts. They are recommending a revision of his TKR. Is following with Dr Alvan Dame. He is considering this presently. Uses Tramadol prn with good results.       Obstructive sleep apnea on CPAP    Settings are stable, follows with Dr Elsworth Soho      Hyperlipidemia, mixed    Encouraged heart healthy diet, increase exercise, avoid trans fats, consider a krill oil cap daily      Relevant Orders   Lipid panel (Completed)   Gout    No recent flares      Relevant Orders   Uric acid (Completed)   Vitamin D deficiency    Check vitamin D       Relevant Orders   VITAMIN D 25 Hydroxy (Vit-D Deficiency, Fractures) (Completed)   Neck pain, bilateral    Encouraged moist heat and gentle stretching as tolerated. May try NSAIDs and prescription meds as directed and report if symptoms worsen or seek immediate care      Vitamin B12 deficiency    Takes a daily Vitamin B 12       Relevant Orders   Vitamin B12 (Completed)    Other Visit Diagnoses    High risk medication use    -  Primary   Relevant Orders   Pain Mgmt, Profile 8 w/Conf, U   Myalgia       Relevant Orders   Magnesium (Completed)   Urinary frequency       Relevant Orders   PSA (Completed)   History of gastric bypass       Relevant Orders   Zinc      I am having [name] "Don" maintain his  multivitamin, Vitamin B-12, Vitamin D-3, Krill Oil, EPINEPHrine, metoprolol tartrate, mometasone, methocarbamol, glucose blood, pantoprazole, fenofibrate, metFORMIN, Pitavastatin Calcium, and traMADol.  No orders of the defined types were placed in this encounter.   CMA served as Education administrator during this visit. History, Physical and Plan performed by medical provider. Documentation and orders reviewed and attested to.  [name], MD

## 2017-06-25 NOTE — Assessment & Plan Note (Signed)
No recent flares 

## 2017-06-25 NOTE — Assessment & Plan Note (Signed)
Check vitamin D. 

## 2017-06-25 NOTE — Patient Instructions (Signed)
[employer] at [email or web address] Carbohydrate Counting for Diabetes Mellitus, Adult Carbohydrate counting is a method for keeping track of how many carbohydrates you eat. Eating carbohydrates naturally increases the amount of sugar (glucose) in the blood. Counting how many carbohydrates you eat helps keep your blood glucose within normal limits, which helps you manage your diabetes (diabetes mellitus). It is important to know how many carbohydrates you can safely have in each meal. This is different for every person. A diet and nutrition specialist (registered dietitian) can help you make a meal plan and calculate how many carbohydrates you should have at each meal and snack. Carbohydrates are found in the following foods:  Grains, such as breads and cereals.  Dried beans and soy products.  Starchy vegetables, such as potatoes, peas, and corn.  Fruit and fruit juices.  Milk and yogurt.  Sweets and snack foods, such as cake, cookies, candy, chips, and soft drinks.  How do I count carbohydrates? There are two ways to count carbohydrates in food. You can use either of the methods or a combination of both. Reading "Nutrition Facts" on packaged food The "Nutrition Facts" list is included on the labels of almost all packaged foods and beverages in the [place] It includes:  The serving size.  Information about nutrients in each serving, including the grams (g) of carbohydrate per serving.  To use the "Nutrition Facts":  Decide how many servings you will have.  Multiply the number of servings by the number of carbohydrates per serving.  The resulting number is the total amount of carbohydrates that you will be having.  Learning standard serving sizes of other foods When you eat foods containing carbohydrates that are not packaged or do not include "Nutrition Facts" on the label, you need to measure the servings in order to count the amount of carbohydrates:  Measure the foods that you will  eat with a food scale or measuring cup, if needed.  Decide how many standard-size servings you will eat.  Multiply the number of servings by 15. Most carbohydrate-rich foods have about 15 g of carbohydrates per serving. ? For example, if you eat 8 oz (170 g) of strawberries, you will have eaten 2 servings and 30 g of carbohydrates (2 servings x 15 g = 30 g).  For foods that have more than one food mixed, such as soups and casseroles, you must count the carbohydrates in each food that is included.  The following list contains standard serving sizes of common carbohydrate-rich foods. Each of these servings has about 15 g of carbohydrates:   hamburger bun or  English muffin.   oz (15 mL) syrup.   oz (14 g) jelly.  1 slice of bread.  1 six-inch tortilla.  3 oz (85 g) cooked rice or pasta.  4 oz (113 g) cooked dried beans.  4 oz (113 g) starchy vegetable, such as peas, corn, or potatoes.  4 oz (113 g) hot cereal.  4 oz (113 g) mashed potatoes or  of a large baked potato.  4 oz (113 g) canned or frozen fruit.  4 oz (120 mL) fruit juice.  4-6 crackers.  6 chicken nuggets.  6 oz (170 g) unsweetened dry cereal.  6 oz (170 g) plain fat-free yogurt or yogurt sweetened with artificial sweeteners.  8 oz (240 mL) milk.  8 oz (170 g) fresh fruit or one small piece of fruit.  24 oz (680 g) popped popcorn.  Example of carbohydrate counting Sample meal  3 oz (  85 g) chicken breast.  6 oz (170 g) brown rice.  4 oz (113 g) corn.  8 oz (240 mL) milk.  8 oz (170 g) strawberries with sugar-free whipped topping. Carbohydrate calculation 1. Identify the foods that contain carbohydrates: ? Rice. ? Corn. ? Milk. ? Strawberries. 2. Calculate how many servings you have of each food: ? 2 servings rice. ? 1 serving corn. ? 1 serving milk. ? 1 serving strawberries. 3. Multiply each number of servings by 15 g: ? 2 servings rice x 15 g = 30 g. ? 1 serving corn x 15 g = 15  g. ? 1 serving milk x 15 g = 15 g. ? 1 serving strawberries x 15 g = 15 g. 4. Add together all of the amounts to find the total grams of carbohydrates eaten: ? 30 g + 15 g + 15 g + 15 g = 75 g of carbohydrates total. This information is not intended to replace advice given to you by your health care provider. Make sure you discuss any questions you have with your health care provider. Document Released: 04/06/2005 Document Revised: 10/25/2015 Document Reviewed: 09/18/2015 Elsevier Interactive Patient Education  [employer].

## 2017-06-25 NOTE — Assessment & Plan Note (Signed)
S/p bypass will check labs

## 2017-06-25 NOTE — Assessment & Plan Note (Signed)
Settings are stable, follows with Dr Elsworth Soho

## 2017-06-25 NOTE — Assessment & Plan Note (Signed)
Right knee unstable, patella shifts. They are recommending a revision of his TKR. Is following with Dr Alvan Dame. He is considering this presently. Uses Tramadol prn with good results.

## 2017-06-25 NOTE — Assessment & Plan Note (Signed)
Well controlled, no changes to meds. Encouraged heart healthy diet such as the DASH diet and exercise as tolerated.  °

## 2017-06-25 NOTE — Assessment & Plan Note (Signed)
Encouraged heart healthy diet, increase exercise, avoid trans fats, consider a krill oil cap daily 

## 2017-06-25 NOTE — Assessment & Plan Note (Signed)
hgba1c acceptable, minimize simple carbs. Increase exercise as tolerated. Continue current meds. Sugars ranging from 70-140 fasting and post prandial

## 2017-06-27 NOTE — Assessment & Plan Note (Signed)
Encouraged moist heat and gentle stretching as tolerated. May try NSAIDs and prescription meds as directed and report if symptoms worsen or seek immediate care 

## 2017-06-28 LAB — PAIN MGMT, PROFILE 8 W/CONF, U
6 Acetylmorphine: NEGATIVE ng/mL (ref <10)
Alcohol Metabolites: POSITIVE ng/mL — AB (ref <500)
Amphetamines: NEGATIVE ng/mL (ref <500)
Benzodiazepines: NEGATIVE ng/mL (ref <100)
Buprenorphine, Urine: NEGATIVE ng/mL (ref <5)
Cocaine Metabolite: NEGATIVE ng/mL (ref <150)
Creatinine: 77.9 mg/dL
Ethyl Glucuronide (ETG): 52162 ng/mL — ABNORMAL HIGH (ref <500)
Ethyl Sulfate (ETS): 9493 ng/mL — ABNORMAL HIGH (ref <100)
MDA: NEGATIVE ng/mL (ref <200)
MDMA: NEGATIVE ng/mL (ref <200)
MDMA: NEGATIVE ng/mL (ref <500)
Marijuana Metabolite: NEGATIVE ng/mL (ref <20)
Opiates: NEGATIVE ng/mL (ref <100)
Oxidant: NEGATIVE ug/mL (ref <200)
Oxycodone: NEGATIVE ng/mL (ref <100)
pH: 6.94 (ref 4.5–9.0)

## 2017-06-28 LAB — ZINC: Zinc: 80 ug/dL (ref 60–130)

## 2017-06-29 ENCOUNTER — Ambulatory Visit: Admitting: Family Medicine

## 2017-06-29 MED ORDER — MAGNESIUM 100 MG PO CAPS: 1.0000 | ORAL_CAPSULE | Freq: Every day | ORAL | 1 refills | Status: AC

## 2017-06-29 NOTE — Addendum Note (Signed)
Addended by: Magdalene Molly A on: 06/29/2017 09:02 AM   Modules accepted: Orders

## 2017-07-04 ENCOUNTER — Encounter: Payer: Self-pay | Admitting: Family Medicine

## 2017-07-05 ENCOUNTER — Other Ambulatory Visit: Payer: Self-pay | Admitting: Family Medicine

## 2017-07-07 NOTE — Telephone Encounter (Signed)
Requesting: ULTRAM 50 MG Contract: 08/06/16 UDS: 06/25/17 Low Risk Last OV: 06/25/17 Next OV: 12/27/17 Last Refill: 06/10/17  #60   Please advise

## 2017-07-13 ENCOUNTER — Other Ambulatory Visit: Payer: Self-pay | Admitting: Family Medicine

## 2017-07-19 NOTE — Telephone Encounter (Signed)
Refill taking care of

## 2017-08-06 ENCOUNTER — Other Ambulatory Visit: Payer: Self-pay | Admitting: Family Medicine

## 2017-08-09 ENCOUNTER — Other Ambulatory Visit: Payer: Self-pay | Admitting: Family Medicine

## 2017-08-09 NOTE — Telephone Encounter (Signed)
Request for refill of Tramadol(Ultram) 50mg  tab. Last refill 07/07/17 #60  LOV: 06/25/17  PCP: Dr. Consuello [facility]

## 2017-08-09 NOTE — Telephone Encounter (Signed)
Requesting: ULTRAM 50 MG Contract: 08/06/16 UDS: 06/25/17 Low risk Last OV: 06/25/17 Next OV: 12/27/17 Last Refill: 07/07/17  #60   Please advise  '

## 2017-08-09 NOTE — Telephone Encounter (Signed)
Copied from [place] [id number]. Topic: Quick Communication - Rx Refill/Question >> Aug 09, 2017  2:11 PM Cleaster Corin, [place] wrote: Medication: traMADol (ULTRAM) 50 MG tablet [[id number]]  Has the patient contacted their pharmacy? yes (Agent: If no, request that the patient contact the pharmacy for the refill.) Preferred Pharmacy (with phone number or street name): [place],  - [address] [place] [address] Phone: [phone] Fax: [phone]   Agent: Please be advised that RX refills may take up to 3 business days. We ask that you follow-up with your pharmacy.

## 2017-08-09 NOTE — Telephone Encounter (Signed)
Rx refilled on behalf of reg pcp.

## 2017-08-10 NOTE — Telephone Encounter (Signed)
Requesting:Tramadol Contract:08/06/16 UDS:06/25/17 low risk Last Visit:06/25/17 Next Visit:12/27/17 Last Refill:Yesterday//refill request not appropriate. Responded to by other means.   Please Advise

## 2017-08-18 ENCOUNTER — Other Ambulatory Visit: Payer: Self-pay | Admitting: Family Medicine

## 2017-08-30 ENCOUNTER — Other Ambulatory Visit: Payer: Self-pay

## 2017-08-30 ENCOUNTER — Encounter (HOSPITAL_BASED_OUTPATIENT_CLINIC_OR_DEPARTMENT_OTHER): Payer: Self-pay

## 2017-08-30 ENCOUNTER — Emergency Department (HOSPITAL_BASED_OUTPATIENT_CLINIC_OR_DEPARTMENT_OTHER)
Admission: EM | Admit: 2017-08-30 | Discharge: 2017-08-31 | Disposition: A | Discharge status: Home / Self Care | Attending: Emergency Medicine | Admitting: Emergency Medicine

## 2017-08-30 DIAGNOSIS — Z79899 Other long term (current) drug therapy: Secondary | ICD-10-CM | POA: Insufficient documentation

## 2017-08-30 DIAGNOSIS — Z96651 Presence of right artificial knee joint: Secondary | ICD-10-CM | POA: Insufficient documentation

## 2017-08-30 DIAGNOSIS — Y939 Activity, unspecified: Secondary | ICD-10-CM | POA: Diagnosis not present

## 2017-08-30 DIAGNOSIS — S61215A Laceration without foreign body of left ring finger without damage to nail, initial encounter: Secondary | ICD-10-CM | POA: Diagnosis present

## 2017-08-30 DIAGNOSIS — I10 Essential (primary) hypertension: Secondary | ICD-10-CM | POA: Diagnosis not present

## 2017-08-30 DIAGNOSIS — Z7984 Long term (current) use of oral hypoglycemic drugs: Secondary | ICD-10-CM | POA: Diagnosis not present

## 2017-08-30 DIAGNOSIS — Y999 Unspecified external cause status: Secondary | ICD-10-CM | POA: Insufficient documentation

## 2017-08-30 DIAGNOSIS — Z87891 Personal history of nicotine dependence: Secondary | ICD-10-CM | POA: Diagnosis not present

## 2017-08-30 DIAGNOSIS — W268XXA Contact with other sharp object(s), not elsewhere classified, initial encounter: Secondary | ICD-10-CM | POA: Diagnosis not present

## 2017-08-30 DIAGNOSIS — Y929 Unspecified place or not applicable: Secondary | ICD-10-CM | POA: Insufficient documentation

## 2017-08-30 DIAGNOSIS — Z955 Presence of coronary angioplasty implant and graft: Secondary | ICD-10-CM | POA: Diagnosis not present

## 2017-08-30 DIAGNOSIS — S61412A Laceration without foreign body of left hand, initial encounter: Secondary | ICD-10-CM

## 2017-08-30 DIAGNOSIS — I2510 Atherosclerotic heart disease of native coronary artery without angina pectoris: Secondary | ICD-10-CM | POA: Insufficient documentation

## 2017-08-30 DIAGNOSIS — E119 Type 2 diabetes mellitus without complications: Secondary | ICD-10-CM | POA: Insufficient documentation

## 2017-08-30 HISTORY — DX: Spinal stenosis, site unspecified: M48.00

## 2017-08-30 MED ORDER — ACETAMINOPHEN 500 MG PO TABS
1000.0000 mg | ORAL_TABLET | Freq: Once | ORAL | Status: AC
  Administered 2017-08-30: 1000 mg via ORAL
  Filled 2017-08-30: qty 2

## 2017-08-30 NOTE — ED Triage Notes (Signed)
Pt cut left ring finger on sheet metal just PTA-horizontal lac noted-cont'd bleeding-bulky gauze dsg applied by EMT

## 2017-08-31 ENCOUNTER — Encounter (HOSPITAL_BASED_OUTPATIENT_CLINIC_OR_DEPARTMENT_OTHER): Payer: Self-pay | Admitting: Emergency Medicine

## 2017-08-31 DIAGNOSIS — S61215A Laceration without foreign body of left ring finger without damage to nail, initial encounter: Secondary | ICD-10-CM | POA: Diagnosis not present

## 2017-08-31 MED ORDER — LIDOCAINE HCL 1 % IJ SOLN
10.0000 mL | Freq: Once | INTRAMUSCULAR | Status: AC
  Administered 2017-08-31: 10 mL via INTRADERMAL

## 2017-08-31 MED ORDER — LIDOCAINE HCL 1 % IJ SOLN
INTRAMUSCULAR | Status: AC
  Administered 2017-08-31: 10 mL via INTRADERMAL
  Filled 2017-08-31: qty 20

## 2017-08-31 MED ORDER — LIDOCAINE HCL (PF) 1 % IJ SOLN: 10.0000 mL | Freq: Once | INTRAMUSCULAR | Status: DC

## 2017-08-31 NOTE — ED Notes (Signed)
ED Provider at bedside. 

## 2017-08-31 NOTE — ED Provider Notes (Signed)
[facility] EMERGENCY DEPARTMENT Provider Note   CSN: [id number] Arrival date & time: 08/30/17  2058     History   Chief Complaint Chief Complaint  Patient presents with  . Extremity Laceration    HPI Brent [name] is a 62 y.o. male.  The history is provided by the patient.  Laceration   The incident occurred 1 to 2 hours ago. The laceration is located on the left hand. Size: 1.25 cm. The laceration mechanism was a a metal edge. The pain is at a severity of 2/10. The pain is mild. The pain has been constant since onset. He reports no foreign bodies present. His tetanus status is UTD.  clean metal edge to the ulnar aspect of left ring finger at the pip.  Hemostatic no associated injuries.  Past Medical History:  Diagnosis Date  . Acid reflux disease   . ACID REFLUX DISEASE 07/05/2007  . Anemia   . Atherosclerosis   . Benign neoplasm of colon 07/05/2007  . Bilateral hip pain 10/05/2016  . Breast pain, left 11/17/2011  . Chest pain    a. Reportedly negative dobut echo performed prior to gastric bypass in 03/2011;  b. 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 12/2011 Cath: LM nl, LAD 50p, D1 94m, LCX min irregs, OM3 30, RCA 25p, 11m (FFR 0.99->0.89), PDA 30, EF 65%, Med Rx.  . COLONIC POLYPS, HX OF 10/29/2008  . Diarrhea 06/13/2010  . Diverticulosis of colon   . DIVERTICULOSIS, COLON 10/29/2008  . DM (diabetes mellitus), type 2, uncontrolled ([place])   . GI bleed   . Gout 03/04/2013  . Hearing loss 05/24/2013   Previous audiology evaluation completely.  [name] HTN (hypertension) 07/14/2010  . Hx of colonic polyps   . HYPERSOMNIA, ASSOCIATED WITH SLEEP APNEA 07/26/2008  . Hypertension 07/14/2010  . 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 Knee pain, left 10/10/2010  . Morbid obesity ([place]) 03/27/2010   a. 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  [name] Neck pain 03/2015  . Other and unspecified hyperlipidemia 11/16/2012  . Preventative health care 11/17/2011  . Renal stone 11/2013  . Sleep apnea    a.  CPAP  . Spinal stenosis   . Tear of meniscus of left knee 2012    Patient Active Problem List   Diagnosis Date Noted  . Bilateral hip pain 10/05/2016  . Chronic pain of both ankles 09/14/2016  . Pain of both hip joints 09/14/2016  . Vitamin B12 deficiency 09/14/2016  . 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 Trapezius muscle spasm 04/02/2015  . Neck pain, bilateral 04/02/2015  . Headache(784.0) 11/26/2013  . Nephrolithiasis 09/27/2013  . Tinea corporis 09/27/2013  . Preventative health care 05/24/2013  . Hearing loss 05/24/2013  . Anemia 03/28/2013  . 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 Hx of adenomatous colonic polyps 03/23/2013  . Gout 03/04/2013  . Hyperlipidemia, mixed 11/16/2012  . Obstructive sleep apnea on CPAP 04/07/2012  . CAD (coronary artery disease) 01/12/2012  . Vitamin D deficiency 10/14/2011  . Gastroesophageal reflux disease 04/01/2011  . Chronic pain of both knees 10/10/2010  . Essential hypertension 07/14/2010  . Morbid obesity ([place]) 03/27/2010  . DIVERTICULOSIS, COLON 10/29/2008  . T2DM (type 2 diabetes mellitus) ([place]) 07/05/2007    Past Surgical History:  Procedure Laterality Date  . ANKLE SURGERY  1994  . CARDIAC CATHETERIZATION     denies any chest pain in the past 2 years  . COLONOSCOPY    . colonoscopy polyps    .  ESOPHAGOGASTRODUODENOSCOPY N/A 03/27/2013   Procedure: ESOPHAGOGASTRODUODENOSCOPY (EGD);  Surgeon: Irene Shipper, MD;  Location: [facility];  Service: Endoscopy;  Laterality: N/A;  . GASTRIC BYPASS    . HERNIA REPAIR    . KNEE ARTHROSCOPY  11/06/10   Left, torn meniscus (repaired)  . LEFT HEART CATHETERIZATION WITH CORONARY ANGIOGRAM N/A 12/23/2011   Procedure: LEFT HEART CATHETERIZATION WITH CORONARY ANGIOGRAM;  Surgeon: Minus Breeding, MD;  Location[facility] CATH LAB;  Service: Cardiovascular;  Laterality: N/A;  . right knee arthroscopy Right 07/05/14   Dr. Hart Robinsons, [place].  . TONSILLECTOMY  age 55  . TOTAL KNEE ARTHROPLASTY  Right 01/20/2016   Procedure: RIGHT TOTAL KNEE ARTHROPLASTY;  Surgeon: Paralee Cancel, MD;  Location: [facility] ORS;  Service: Orthopedics;  Laterality: Right;  . UPPER GI ENDOSCOPY  03/27/13        Home Medications    Prior to Admission medications   Medication Sig Start Date End Date Taking? Authorizing Provider  Cholecalciferol (VITAMIN D-3) 5000 UNITS TABS Take 1 tablet by mouth every morning.     [provider]  Cyanocobalamin (VITAMIN B-12) 1000 MCG SUBL Place 1 each under the tongue every morning.     [provider]  EPINEPHrine (EPIPEN 2-PAK) 0.3 mg/0.3 mL IJ SOAJ injection Inject 0.3 mLs (0.3 mg total) into the muscle once. 08/16/15   [name], MD  fenofibrate 160 MG tablet TAKE 1 TABLET DAILY 07/13/17   [name] A, MD  glucose blood test strip Use as directed once daily to check blood sugar.  Diagnosis code E11.9 12/14/16   [name], MD  Javier Docker Oil 1000 MG CAPS Take 1 tablet by mouth 2 (two) times daily.     [provider]  Magnesium 100 MG CAPS Take 1 capsule (100 mg total) by mouth daily. 06/29/17   [name], MD  metFORMIN (GLUCOPHAGE) 500 MG tablet TAKE 1 TABLET FOUR TIMES A DAY 04/12/17   [name], MD  methocarbamol (ROBAXIN) 500 MG tablet TAKE ONE TABLET BY MOUTH EVERY 6 HOURS AS NEEDED FOR MUSCLE SPASMS 08/23/17   [name], MD  metoprolol tartrate (LOPRESSOR) 25 MG tablet TAKE ONE-HALF (1/2) TABLET TWICE A DAY 10/27/15   [name], MD  mometasone (ELOCON) 0.1 % cream Apply 1 application topically daily. 10/28/16   [name], MD  Multiple Vitamin (MULTIVITAMIN) tablet Take 1 tablet by mouth 2 (two) times daily. BARIATRIC VIT    [provider]  pantoprazole (PROTONIX) 40 MG tablet TAKE 1 TABLET TWICE A DAY 07/13/17   [name], MD  Pitavastatin Calcium (LIVALO) 1 MG TABS Take 1 tablet (1 mg total) by mouth daily. 04/14/17   [name], MD  traMADol Veatrice Bourbon) 50 MG tablet TAKE ONE TABLET BY MOUTH  TWICE DAILY 08/09/17   Shelda Pal, DO  ramipril (ALTACE) 10 MG tablet Take 1 tablet (10 mg total) by mouth daily. 2 tabs po daily 03/02/11 07/14/11  [name], MD    Family History Family History  Problem Relation Age of Onset  . Diabetes Mother   . Hypertension Mother   . Stroke Mother   . Hyperlipidemia Mother   . Hypertension Father   . Colon polyps Father   . Heart attack Father 72  . Stroke Father   . Heart attack Brother   . Diabetes Brother   . Heart disease Brother   . Heart attack Brother  Multiple  . Diabetes Brother   . Other Brother        heart problems  . Heart disease Brother   . Diabetes Sister   . Obesity Brother   . Diabetes Brother   . Heart disease Brother   . Hypertension Maternal Grandmother   . ADD / ADHD Daughter   . Heart disease Brother   . Stomach cancer Neg Hx   . Colon cancer Neg Hx   . Esophageal cancer Neg Hx   . Rectal cancer Neg Hx     Social History Social History   Tobacco Use  . Smoking status: Former Smoker    Packs/day: 1.50    Years: 20.00    Pack years: 30.00    Types: Cigarettes    Last attempt to quit: 04/21/1991    Years since quitting: 26.3  . Smokeless tobacco: Never Used  Substance Use Topics  . Alcohol use: Yes    Alcohol/week: 1.8 oz    Types: 3 Cans of beer per week    Comment: social  . Drug use: No     Allergies   Bee venom; Lipitor [atorvastatin]; Morphine; Hydrocodone; and Pravastatin   Review of Systems Review of Systems  Constitutional: Negative for fever.  Respiratory: Negative for shortness of breath.   Cardiovascular: Negative for chest pain, palpitations and leg swelling.  Skin: Positive for wound.  All other systems reviewed and are negative.    Physical Exam Updated Vital Signs BP (!) 158/90 (BP Location: Right Arm)   Pulse 72   Temp 98.4 F (36.9 C) (Oral)   Resp 18   Ht 5\' 8"  (1.727 m)   Wt 108.9 kg (240 lb)   SpO2 98%   BMI 36.49 kg/m   Physical  Exam  Constitutional: He is oriented to person, place, and time. He appears well-developed and well-nourished. No distress.  HENT:  Head: Normocephalic and atraumatic.  Mouth/Throat: Oropharynx is clear and moist. No oropharyngeal exudate.  Eyes: Pupils are equal, round, and reactive to light. Conjunctivae are normal.  Neck: Normal range of motion. Neck supple.  Cardiovascular: Normal rate, regular rhythm, normal heart sounds and intact distal pulses.  Pulmonary/Chest: Effort normal and breath sounds normal. No stridor. He has no wheezes. He has no rales.  Abdominal: Soft. Bowel sounds are normal. He exhibits no mass. There is no tenderness. There is no rebound and no guarding.  Musculoskeletal:       Left hand: He exhibits laceration. He exhibits normal capillary refill. Normal sensation noted. Normal strength noted.       Hands: Neurological: He is alert and oriented to person, place, and time.  Skin: Skin is warm and dry. Capillary refill takes less than 2 seconds.  Psychiatric: He has a normal mood and affect.  Nursing note and vitals reviewed.    ED Treatments / Results  Labs (all labs ordered are listed, but only abnormal results are displayed) Labs Reviewed - No data to display  EKG None  Radiology No results found.  Procedures .Marland KitchenLaceration Repair Date/Time: 08/31/2017 12:36 AM Performed by: Veatrice Kells, MD Authorized by: Veatrice Kells, MD   Consent:    Consent obtained:  Verbal   Consent given by:  Patient   Risks discussed:  Infection, need for additional repair, nerve damage, poor wound healing, poor cosmetic result, pain, retained foreign body, tendon damage and vascular damage   Alternatives discussed:  No treatment Anesthesia (see MAR for exact dosages):    Anesthesia method:  Local infiltration   Local anesthetic:  Lidocaine 1% w/o epi Laceration details:    Location:  Finger   Finger location:  L ring finger   Length (cm):  1.3   Depth (mm):   1 Repair type:    Repair type:  Simple Pre-procedure details:    Preparation:  Patient was prepped and draped in usual sterile fashion Exploration:    Hemostasis achieved with:  Direct pressure   Wound extent: no areolar tissue violation noted, no fascia violation noted, no foreign bodies/material noted, no muscle damage noted, no nerve damage noted, no tendon damage noted, no underlying fracture noted and no vascular damage noted     Contaminated: no   Treatment:    Area cleansed with:  Betadine   Amount of cleaning:  Extensive   Irrigation solution:  Sterile saline Skin repair:    Repair method:  Sutures   Suture size:  4-0   Suture material:  Nylon   Suture technique:  Simple interrupted   Number of sutures:  4 Approximation:    Approximation:  Loose Post-procedure details:    Dressing:  Sterile dressing   Patient tolerance of procedure:  Tolerated well, no immediate complications   (including critical care time)  Medications Ordered in ED Medications  lidocaine (XYLOCAINE) 1 % (with pres) injection 10 mL (has no administration in time range)  acetaminophen (TYLENOL) tablet 1,000 mg (1,000 mg Oral Given 08/30/17 2335)        Final Clinical Impressions(s) / ED Diagnoses  Suture removal in 10 days.   Return for redness, swelling, pus draining, streaking up the arm,  fevers >100.4 unrelieved by medication, shortness of breath, intractable vomiting, or diarrhea, abdominal pain, Inability to tolerate liquids or food, cough, altered mental status or any concerns. No signs of systemic illness or infection. The patient is nontoxic-appearing on exam and vital signs are within normal limits.   I have reviewed the triage vital signs and the nursing notes. Pertinent labs &imaging results that were available during my care of the patient were reviewed by me and considered in my medical decision making (see chart for details).  After history, exam, and medical workup I feel the  patient has been appropriately medically screened and is safe for discharge home. Pertinent diagnoses were discussed with the patient. Patient was given return precautions.       Anjalee Cope, MD 08/31/17 [phone]

## 2017-09-06 ENCOUNTER — Encounter: Payer: Self-pay | Admitting: Family Medicine

## 2017-09-06 ENCOUNTER — Other Ambulatory Visit: Payer: Self-pay | Admitting: Family Medicine

## 2017-09-06 MED ORDER — TRAMADOL HCL 50 MG PO TABS
50.0000 mg | ORAL_TABLET | Freq: Three times a day (TID) | ORAL | 0 refills | Status: DC | PRN
Start: 2017-09-06 — End: 2017-10-07

## 2017-09-09 NOTE — Telephone Encounter (Signed)
Medication has been sent  Patient made aware

## 2017-10-04 ENCOUNTER — Ambulatory Visit (INDEPENDENT_AMBULATORY_CARE_PROVIDER_SITE_OTHER): Admitting: Family Medicine

## 2017-10-04 DIAGNOSIS — M25551 Pain in right hip: Secondary | ICD-10-CM | POA: Diagnosis not present

## 2017-10-04 DIAGNOSIS — I10 Essential (primary) hypertension: Secondary | ICD-10-CM

## 2017-10-04 DIAGNOSIS — M25552 Pain in left hip: Secondary | ICD-10-CM

## 2017-10-04 DIAGNOSIS — M25562 Pain in left knee: Secondary | ICD-10-CM

## 2017-10-04 DIAGNOSIS — M25561 Pain in right knee: Secondary | ICD-10-CM

## 2017-10-04 DIAGNOSIS — G8929 Other chronic pain: Secondary | ICD-10-CM | POA: Diagnosis not present

## 2017-10-04 NOTE — Progress Notes (Signed)
Subjective:  I acted as a Education administrator for Dr. Charlett Blake. [name], [place]  Patient ID: Brent [name], male    DOB: Jul 23, 1955, 62 y.o.   MRN: [id number]  No chief complaint on file.   HPI  Patient is in today for evaluation of worsening bilateral knee, hip and low back pain. He also notes the medications he needs to manage his pain causes sedation. No recent fall or injury. He is following with Dr Alvan Dame of orthopaedics and he needs Robaxin, Tramadol and Gabapentin to manage his symptoms. No recent febrile illness or hospitalizations. Denies CP/palp/SOB/HA/congestion/fevers/GI or GU c/o. Taking meds as prescribed  Patient Care Team: [name], MD as PCP - General (Family Medicine) Rana Snare, MD as Consulting Physician (Urology) Irene Shipper, MD as Consulting Physician (Gastroenterology) Rigoberto Noel, MD as Consulting Physician (Pulmonary Disease) Larey Dresser, MD as Consulting Physician (Cardiology) Paralee Cancel, MD as Consulting Physician (Orthopedic Surgery)   Past Medical History:  Diagnosis Date  . Acid reflux disease   . ACID REFLUX DISEASE 07/05/2007  . Anemia   . Atherosclerosis   . Benign neoplasm of colon 07/05/2007  . Bilateral hip pain 10/05/2016  . Breast pain, left 11/17/2011  . Chest pain    a. Reportedly negative dobut echo performed prior to gastric bypass in 03/2011;  b. 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 12/2011 Cath: LM nl, LAD 50p, D1 65m, LCX min irregs, OM3 30, RCA 25p, 100m (FFR 0.99->0.89), PDA 30, EF 65%, Med Rx.  . COLONIC POLYPS, HX OF 10/29/2008  . Diarrhea 06/13/2010  . Diverticulosis of colon   . DIVERTICULOSIS, COLON 10/29/2008  . DM (diabetes mellitus), type 2, uncontrolled ([place])   . GI bleed   . Gout 03/04/2013  . Hearing loss 05/24/2013   Previous audiology evaluation completely.  [name] HTN (hypertension) 07/14/2010  . Hx of colonic polyps   . HYPERSOMNIA, ASSOCIATED WITH SLEEP APNEA 07/26/2008  . Hypertension 07/14/2010  . 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 Knee pain, left 10/10/2010  . Morbid obesity ([place]) 03/27/2010   a. 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  [name] Neck pain 03/2015  . Other and unspecified hyperlipidemia 11/16/2012  . Preventative health care 11/17/2011  . Renal stone 11/2013  . Sleep apnea    a. CPAP  . Spinal stenosis   . Tear of meniscus of left knee 2012    Past Surgical History:  Procedure Laterality Date  . ANKLE SURGERY  1994  . CARDIAC CATHETERIZATION     denies any chest pain in the past 2 years  . COLONOSCOPY    . colonoscopy polyps    . ESOPHAGOGASTRODUODENOSCOPY N/A 03/27/2013   Procedure: ESOPHAGOGASTRODUODENOSCOPY (EGD);  Surgeon: Irene Shipper, MD;  Location: [facility];  Service: Endoscopy;  Laterality: N/A;  . GASTRIC BYPASS    . HERNIA REPAIR    . KNEE ARTHROSCOPY  11/06/10   Left, torn meniscus (repaired)  . LEFT HEART CATHETERIZATION WITH CORONARY ANGIOGRAM N/A 12/23/2011   Procedure: LEFT HEART CATHETERIZATION WITH CORONARY ANGIOGRAM;  Surgeon: Minus Breeding, MD;  [facility];  Service: Cardiovascular;  Laterality: N/A;  . right knee arthroscopy Right 07/05/14   Dr. Hart Robinsons, [place].  . TONSILLECTOMY  age 49  . TOTAL KNEE ARTHROPLASTY Right 01/20/2016   Procedure: RIGHT TOTAL KNEE ARTHROPLASTY;  Surgeon: Paralee Cancel, MD;  Location: [facility];  Service: Orthopedics;  Laterality: Right;  . UPPER GI ENDOSCOPY  03/27/13    Family  History  Problem Relation Age of Onset  . Diabetes Mother   . Hypertension Mother   . Stroke Mother   . Hyperlipidemia Mother   . Hypertension Father   . Colon polyps Father   . Heart attack Father 76  . Stroke Father   . Heart attack Brother   . Diabetes Brother   . Heart disease Brother   . Heart attack Brother        Multiple  . Diabetes Brother   . Other Brother        heart problems  . Heart disease Brother   . Diabetes Sister   . Obesity Brother   . Diabetes Brother   . Heart disease Brother   . Hypertension Maternal Grandmother     . ADD / ADHD Daughter   . Heart disease Brother   . Stomach cancer Neg Hx   . Colon cancer Neg Hx   . Esophageal cancer Neg Hx   . Rectal cancer Neg Hx     Social History   Socioeconomic History  . Marital status: Married    Spouse name: Not on file  . Number of children: 2  . Years of education: Not on file  . Highest education level: Not on file  Occupational History  . Occupation: Product manager: [employer]  Social Needs  . Financial resource strain: Not on file  . Food insecurity:    Worry: Not on file    Inability: Not on file  . Transportation needs:    Medical: Not on file    Non-medical: Not on file  Tobacco Use  . Smoking status: Former Smoker    Packs/day: 1.50    Years: 20.00    Pack years: 30.00    Types: Cigarettes    Last attempt to quit: 04/21/1991    Years since quitting: 26.4  . Smokeless tobacco: Never Used  Substance and Sexual Activity  . Alcohol use: Yes    Alcohol/week: 1.8 oz    Types: 3 Cans of beer per week    Comment: social  . Drug use: No  . Sexual activity: Not on file  Lifestyle  . Physical activity:    Days per week: Not on file    Minutes per session: Not on file  . Stress: Not on file  Relationships  . Social connections:    Talks on phone: Not on file    Gets together: Not on file    Attends religious service: Not on file    Active member of club or organization: Not on file    Attends meetings of clubs or organizations: Not on file    Relationship status: Not on file  . Intimate partner violence:    Fear of current or ex partner: Not on file    Emotionally abused: Not on file    Physically abused: Not on file    Forced sexual activity: Not on file  Other Topics Concern  . Not on file  Social History Narrative   Lives with wife in [place].  Does not routinely exercise.    Outpatient Medications Prior to Visit  Medication Sig Dispense Refill  . Cholecalciferol (VITAMIN D-3) 5000 UNITS TABS Take 1  tablet by mouth every morning.     . Cyanocobalamin (VITAMIN B-12) 1000 MCG SUBL Place 1 each under the tongue every morning.     [name] EPINEPHrine (EPIPEN 2-PAK) 0.3 mg/0.3 mL IJ SOAJ injection Inject 0.3 mLs (0.3 mg  total) into the muscle once. 3 Device 1  . fenofibrate 160 MG tablet TAKE 1 TABLET DAILY 90 tablet 1  . gabapentin (NEURONTIN) 300 MG capsule Take 1 capsule (300 mg total) by mouth 3 (three) times daily. 90 capsule 3  . glucose blood test strip Use as directed once daily to check blood sugar.  Diagnosis code E11.9 100 each 12  . Krill Oil 1000 MG CAPS Take 1 tablet by mouth 2 (two) times daily.     . Magnesium 100 MG CAPS Take 1 capsule (100 mg total) by mouth daily. 30 capsule 1  . metFORMIN (GLUCOPHAGE) 500 MG tablet TAKE 1 TABLET FOUR TIMES A DAY 360 tablet 1  . methocarbamol (ROBAXIN) 500 MG tablet TAKE ONE TABLET BY MOUTH EVERY 6 HOURS AS NEEDED FOR MUSCLE SPASMS 120 tablet 5  . metoprolol tartrate (LOPRESSOR) 25 MG tablet TAKE ONE-HALF (1/2) TABLET TWICE A DAY 90 tablet 1  . mometasone (ELOCON) 0.1 % cream Apply 1 application topically daily. 50 g 1  . Multiple Vitamin (MULTIVITAMIN) tablet Take 1 tablet by mouth 2 (two) times daily. BARIATRIC VIT    . pantoprazole (PROTONIX) 40 MG tablet TAKE 1 TABLET TWICE A DAY 180 tablet 1  . Pitavastatin Calcium (LIVALO) 1 MG TABS Take 1 tablet (1 mg total) by mouth daily. 90 tablet 1  . traMADol (ULTRAM) 50 MG tablet Take 1 tablet (50 mg total) by mouth every 8 (eight) hours as needed. 90 tablet 0   No facility-administered medications prior to visit.     Allergies  Allergen Reactions  . Bee Venom Anaphylaxis  . Lipitor [Atorvastatin]     Myalgias, memory changes  . Morphine     hyperactive  . Hydrocodone Itching  . Pravastatin Other (See Comments)    Made joints hurt. --also simvastatin     Review of Systems  Constitutional: Positive for malaise/fatigue. Negative for chills and fever.  HENT: Negative for congestion and hearing  loss.   Eyes: Negative for discharge.  Respiratory: Negative for cough, sputum production and shortness of breath.   Cardiovascular: Negative for chest pain, palpitations and leg swelling.  Gastrointestinal: Negative for abdominal pain, blood in stool, constipation, diarrhea, heartburn, nausea and vomiting.  Genitourinary: Negative for dysuria, frequency, hematuria and urgency.  Musculoskeletal: Positive for back pain, joint pain and neck pain. Negative for falls and myalgias.  Skin: Negative for rash.  Neurological: Negative for dizziness, sensory change, loss of consciousness, weakness and headaches.  Endo/Heme/Allergies: Negative for environmental allergies. Does not bruise/bleed easily.  Psychiatric/Behavioral: Negative for depression and suicidal ideas. The patient is not nervous/anxious and does not have insomnia.        Objective:    Physical Exam  Constitutional: He is oriented to person, place, and time. No distress.  HENT:  Head: Normocephalic and atraumatic.  Eyes: Conjunctivae are normal.  Neck: Neck supple. No thyromegaly present.  Cardiovascular: Normal rate, regular rhythm and normal heart sounds.  No murmur heard. Pulmonary/Chest: Effort normal and breath sounds normal. No respiratory distress.  Abdominal: He exhibits no distension and no mass. There is no tenderness.  Musculoskeletal: He exhibits no edema.  Neurological: He is alert and oriented to person, place, and time.  Skin: Skin is warm.  Psychiatric: Judgment normal.    BP 140/82 (BP Location: Left Arm, Patient Position: Sitting, Cuff Size: Large)   Pulse 80   Temp 98.7 F (37.1 C) (Oral)   Resp 18   Wt 247 lb 6.4 oz (112.2 kg)  SpO2 100%   BMI 37.62 kg/m  Wt Readings from Last 3 Encounters:  10/04/17 247 lb 6.4 oz (112.2 kg)  08/30/17 240 lb (108.9 kg)  06/25/17 253 lb (114.8 kg)   BP Readings from Last 3 Encounters:  10/04/17 140/82  08/31/17 (!) 153/85  06/25/17 (!) 142/80      Immunization History  Administered Date(s) Administered  . Influenza Split 02/11/2011, 02/02/2012  . Influenza Whole 01/18/2010, 03/04/2013  . Influenza,inj,Quad PF,6+ Mos 05/08/2014, 04/02/2015  . Pneumococcal Conjugate-13 05/08/2014  . Pneumococcal Polysaccharide-23 05/27/2015  . Td 04/20/1996, 10/29/2008  . Tdap 03/01/2012  . Zoster 12/02/2015    Health Maintenance  Topic Date Due  . FOOT EXAM  11/15/2015  . OPHTHALMOLOGY EXAM  07/21/2016  . URINE MICROALBUMIN  12/01/2016  . INFLUENZA VACCINE  11/18/2017  . HEMOGLOBIN A1C  12/26/2017  . PNEUMOCOCCAL POLYSACCHARIDE VACCINE (2) 05/26/2020  . COLONOSCOPY  07/27/2021  . TETANUS/TDAP  03/01/2022  . Hepatitis C Screening  Completed  . HIV Screening  Completed    Lab Results  Component Value Date   WBC 4.3 06/25/2017   HGB 12.1 (L) 06/25/2017   HCT 37.2 (L) 06/25/2017   PLT 231.0 06/25/2017   GLUCOSE 153 (H) 06/25/2017   CHOL 126 06/25/2017   TRIG 94.0 06/25/2017   HDL 55.80 06/25/2017   LDLDIRECT 85.0 11/17/2016   LDLCALC 51 06/25/2017   ALT 16 06/25/2017   AST 19 06/25/2017   NA 140 06/25/2017   K 4.8 06/25/2017   CL 104 06/25/2017   CREATININE 0.94 06/25/2017   BUN 12 06/25/2017   CO2 29 06/25/2017   TSH 1.56 06/25/2017   PSA 1.55 06/25/2017   INR 1.13 03/29/2013   HGBA1C 6.5 06/25/2017   MICROALBUR 1.1 12/02/2015    Lab Results  Component Value Date   TSH 1.56 06/25/2017   Lab Results  Component Value Date   WBC 4.3 06/25/2017   HGB 12.1 (L) 06/25/2017   HCT 37.2 (L) 06/25/2017   MCV 82.6 06/25/2017   PLT 231.0 06/25/2017   Lab Results  Component Value Date   NA 140 06/25/2017   K 4.8 06/25/2017   CO2 29 06/25/2017   GLUCOSE 153 (H) 06/25/2017   BUN 12 06/25/2017   CREATININE 0.94 06/25/2017   BILITOT 0.5 06/25/2017   ALKPHOS 34 (L) 06/25/2017   AST 19 06/25/2017   ALT 16 06/25/2017   PROT 6.5 06/25/2017   ALBUMIN 4.4 06/25/2017   CALCIUM 9.5 06/25/2017   ANIONGAP 8 01/21/2016    GFR 86.51 06/25/2017   Lab Results  Component Value Date   CHOL 126 06/25/2017   Lab Results  Component Value Date   HDL 55.80 06/25/2017   Lab Results  Component Value Date   LDLCALC 51 06/25/2017   Lab Results  Component Value Date   TRIG 94.0 06/25/2017   Lab Results  Component Value Date   CHOLHDL 2 06/25/2017   Lab Results  Component Value Date   HGBA1C 6.5 06/25/2017         Assessment & Plan:   Problem List Items Addressed This Visit    Morbid obesity ([place])    Maintain heart healthy diet such as DASH diet, decrease po intake and increase exercise as tolerated. Needs 7-8 hours of sleep nightly. Avoid trans fats, eat small, frequent meals every 4-5 hours with lean proteins, complex carbs and healthy fats. Minimize simple carbs      Essential hypertension    Well controlled, no changes  to meds. Encouraged heart healthy diet such as the DASH diet and exercise as tolerated.       Chronic pain of both knees    Continues to struggle with debility due to b/l knee pain. Is working with Dr Alvan Dame of orthopaedics. He is thinking about applying for disability he requires Tramadol, Robaxin and Gabapentin to manage his pain and this causes some somnolence.       Relevant Medications   gabapentin (NEURONTIN) 300 MG capsule   Pain of both hip joints    struggles with hip and low back pain which contributes to his dialy dysfunction.          I am having Brent Greek "Don" maintain his multivitamin, Vitamin B-12, Vitamin D-3, Krill Oil, EPINEPHrine, metoprolol tartrate, mometasone, glucose blood, metFORMIN, Pitavastatin Calcium, Magnesium, pantoprazole, fenofibrate, methocarbamol, traMADol, and gabapentin.  No orders of the defined types were placed in this encounter.    [name], MD CMA served as scribe during this visit. History, Physical and Plan performed by medical provider. Documentation and orders reviewed and attested to.

## 2017-10-04 NOTE — Patient Instructions (Signed)

## 2017-10-06 NOTE — Assessment & Plan Note (Signed)
Maintain heart healthy diet such as DASH diet, decrease po intake and increase exercise as tolerated. Needs 7-8 hours of sleep nightly. Avoid trans fats, eat small, frequent meals every 4-5 hours with lean proteins, complex carbs and healthy fats. Minimize simple carbs

## 2017-10-06 NOTE — Assessment & Plan Note (Signed)
Continues to struggle with debility due to b/l knee pain. Is working with Dr Alvan Dame of orthopaedics. He is thinking about applying for disability he requires Tramadol, Robaxin and Gabapentin to manage his pain and this causes some somnolence.

## 2017-10-06 NOTE — Assessment & Plan Note (Signed)
struggles with hip and low back pain which contributes to his dialy dysfunction.

## 2017-10-06 NOTE — Assessment & Plan Note (Signed)
Well controlled, no changes to meds. Encouraged heart healthy diet such as the DASH diet and exercise as tolerated.  °

## 2017-10-07 ENCOUNTER — Other Ambulatory Visit: Payer: Self-pay | Admitting: Family Medicine

## 2017-10-07 NOTE — Telephone Encounter (Signed)
Requesting: ULTRAM 50 MG Contract: 08/06/16 UDS:06/25/17 Low risk Last OV: 10/04/17 Next OV:11/09/17 Last Refill: 09/06/17   Please advise

## 2017-10-10 ENCOUNTER — Encounter: Payer: Self-pay | Admitting: Family Medicine

## 2017-10-11 ENCOUNTER — Telehealth: Payer: Self-pay | Admitting: Cardiology

## 2017-10-11 ENCOUNTER — Encounter: Payer: Self-pay | Admitting: Family Medicine

## 2017-10-11 NOTE — Telephone Encounter (Signed)
° °  [facility] Pre-operative Risk Assessment    Request for surgical clearance:  1. What type of surgery is being performed? Right knee excision Saphenous nerve  2. When is this surgery scheduled? 11/15/2017  3. What type of clearance is required (medical clearance vs. Pharmacy clearance to hold med vs. Both)? Both   4. Are there any medications that need to be held prior to surgery and how long? Aspirin   5. Practice name and name of physician performing surgery? [facility], Dr. Alvan Dame   6. What is your office phone number [phone], ATTN: Orson Slick    7.   What is your office fax number [phone]  8.   Anesthesia type (None, local, MAC, general) ? Choice    Derl Barrow 10/11/2017, 4:19 PM  _________________________________________________________________   (provider comments below)

## 2017-10-12 NOTE — Telephone Encounter (Signed)
   Primary Cardiologist: Previously Dr. Aundra Dubin when he was in general office.  Chart reviewed as part of pre-operative protocol coverage. Because of Brent [name]'s past medical history and time since last visit, he/she will require a follow-up visit in order to better assess preoperative cardiovascular risk.  Last OV 2016.  Pre-op covering staff: - Please schedule appointment and call patient to inform them. - Please contact requesting surgeon's office via preferred method (i.e, phone, fax) to inform them of need for appointment prior to surgery.  Charlie Pitter, PA-C  10/12/2017, 4:41 PM

## 2017-10-13 NOTE — Telephone Encounter (Signed)
Pt has been made aware that he will need an o/v before he can be cleared for his upcoming procedure on his knee. He has been scheduled to see Cecilie Kicks, NP 10/28/17.

## 2017-10-18 ENCOUNTER — Encounter: Payer: Self-pay | Admitting: Family Medicine

## 2017-10-25 MED ORDER — METOPROLOL TARTRATE 25 MG PO TABS
ORAL_TABLET | ORAL | 1 refills | Status: DC
Start: 2017-10-25 — End: 2018-01-25

## 2017-10-28 ENCOUNTER — Ambulatory Visit: Admitting: Cardiology

## 2017-10-29 ENCOUNTER — Encounter: Payer: Self-pay | Admitting: Cardiology

## 2017-10-29 ENCOUNTER — Ambulatory Visit (INDEPENDENT_AMBULATORY_CARE_PROVIDER_SITE_OTHER): Admitting: Cardiology

## 2017-10-29 VITALS — BP 144/90 | HR 99 | Ht 68.0 in | Wt 249.8 lb

## 2017-10-29 DIAGNOSIS — Z01818 Encounter for other preprocedural examination: Secondary | ICD-10-CM

## 2017-10-29 DIAGNOSIS — E782 Mixed hyperlipidemia: Secondary | ICD-10-CM | POA: Diagnosis not present

## 2017-10-29 DIAGNOSIS — I2510 Atherosclerotic heart disease of native coronary artery without angina pectoris: Secondary | ICD-10-CM

## 2017-10-29 DIAGNOSIS — E119 Type 2 diabetes mellitus without complications: Secondary | ICD-10-CM | POA: Diagnosis not present

## 2017-10-29 NOTE — Progress Notes (Signed)
Cardiology Office Note   Date:  10/29/2017   ID:  Brent [name], Brent [name] March 03, 1956, MRN [id number]  PCP:  [name], MD  Cardiologist:  Was Dr. Aundra Dubin Now Dr. Marlou Porch     Chief Complaint  Patient presents with  . Pre-op Exam      History of Present Illness: Brent [name] is a 62 y.o. male who presents for pre-op for Rt knee excision, saphenous nerve by Dr. Alvan Dame.  Last seen by Dr. Aundra Dubin 02/2015.   Pt with hx of HTN, DM-2, HLD, cardiac cath 12/2011 with 70% mRCA stenosis, managed medically, lexiscan done 05/2014 low risk study and EF 55%.  + FH CAD with father and Brothers.   Today he feels well. No chest pain and no SOB, no lightheadedness and no dizziness. He is able to mow his yard without chest pain or SOB.  He uses riding and push mower,  And he is able to walk up 2 flights of steps without chest pain or SOB.  He is on statin, followed by PCP along with fenofibrate. BP treated and controlled.  On last visit Dr. Aundra Dubin told him to come as needed.    Pt has been doing well.  Denies any chest pain, no SOB.  Is able to walk up 2 flights of steps without pain or SOB.  His DM is controlled.       Past Medical History:  Diagnosis Date  . Acid reflux disease   . ACID REFLUX DISEASE 07/05/2007  . Anemia   . Atherosclerosis   . Benign neoplasm of colon 07/05/2007  . Bilateral hip pain 10/05/2016  . Breast pain, left 11/17/2011  . Chest pain    a. Reportedly negative dobut echo performed prior to gastric bypass in 03/2011;  b. 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 12/2011 Cath: LM nl, LAD 50p, D1 84m, LCX min irregs, OM3 30, RCA 25p, 52m (FFR 0.99->0.89), PDA 30, EF 65%, Med Rx.  . COLONIC POLYPS, HX OF 10/29/2008  . Diarrhea 06/13/2010  . Diverticulosis of colon   . DIVERTICULOSIS, COLON 10/29/2008  . DM (diabetes mellitus), type 2, uncontrolled ([place])   . GI bleed   . Gout 03/04/2013  . Hearing loss 05/24/2013   Previous audiology evaluation completely.  [name] HTN (hypertension)  07/14/2010  . Hx of colonic polyps   . HYPERSOMNIA, ASSOCIATED WITH SLEEP APNEA 07/26/2008  . Hypertension 07/14/2010  . 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 Knee pain, left 10/10/2010  . Morbid obesity ([place]) 03/27/2010   a. 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  [name] Neck pain 03/2015  . Other and unspecified hyperlipidemia 11/16/2012  . Preventative health care 11/17/2011  . Renal stone 11/2013  . Sleep apnea    a. CPAP  . Spinal stenosis   . Tear of meniscus of left knee 2012    Past Surgical History:  Procedure Laterality Date  . ANKLE SURGERY  1994  . CARDIAC CATHETERIZATION     denies any chest pain in the past 2 years  . COLONOSCOPY    . colonoscopy polyps    . ESOPHAGOGASTRODUODENOSCOPY N/A 03/27/2013   Procedure: ESOPHAGOGASTRODUODENOSCOPY (EGD);  Surgeon: Irene Shipper, MD;  Location: [facility];  Service: Endoscopy;  Laterality: N/A;  . GASTRIC BYPASS    . HERNIA REPAIR    . KNEE ARTHROSCOPY  11/06/10   Left, torn meniscus (repaired)  . LEFT HEART CATHETERIZATION WITH CORONARY ANGIOGRAM N/A 12/23/2011   Procedure: LEFT HEART CATHETERIZATION WITH  CORONARY ANGIOGRAM;  Surgeon: Minus Breeding, MD;  Location: [facility] CATH LAB;  Service: Cardiovascular;  Laterality: N/A;  . right knee arthroscopy Right 07/05/14   Dr. Hart Robinsons, [place].  . TONSILLECTOMY  age 63  . TOTAL KNEE ARTHROPLASTY Right 01/20/2016   Procedure: RIGHT TOTAL KNEE ARTHROPLASTY;  Surgeon: Paralee Cancel, MD;  Location: [facility];  Service: Orthopedics;  Laterality: Right;  . UPPER GI ENDOSCOPY  03/27/13     Current Outpatient Medications  Medication Sig Dispense Refill  . Cholecalciferol (VITAMIN D-3) 5000 UNITS TABS Take 1 tablet by mouth every morning.     . Cyanocobalamin (VITAMIN B-12) 1000 MCG SUBL Place 1 each under the tongue every morning.     [name] EPINEPHrine (EPIPEN 2-PAK) 0.3 mg/0.3 mL IJ SOAJ injection Inject 0.3 mLs (0.3 mg total) into the muscle once. 3 Device 1  . fenofibrate 160 MG tablet TAKE 1 TABLET DAILY  90 tablet 1  . gabapentin (NEURONTIN) 300 MG capsule Take 1 capsule (300 mg total) by mouth 3 (three) times daily. 90 capsule 3  . glucose blood test strip Use as directed once daily to check blood sugar.  Diagnosis code E11.9 100 each 12  . Krill Oil 1000 MG CAPS Take 1 tablet by mouth 2 (two) times daily.     . Magnesium 100 MG CAPS Take 1 capsule (100 mg total) by mouth daily. 30 capsule 1  . metFORMIN (GLUCOPHAGE) 500 MG tablet TAKE 1 TABLET FOUR TIMES A DAY 360 tablet 1  . methocarbamol (ROBAXIN) 500 MG tablet TAKE ONE TABLET BY MOUTH EVERY 6 HOURS AS NEEDED FOR MUSCLE SPASMS 120 tablet 5  . metoprolol tartrate (LOPRESSOR) 25 MG tablet TAKE ONE-HALF (1/2) TABLET TWICE A DAY 90 tablet 1  . mometasone (ELOCON) 0.1 % cream Apply 1 application topically daily. 50 g 1  . Multiple Vitamin (MULTIVITAMIN) tablet Take 1 tablet by mouth 2 (two) times daily. BARIATRIC VIT    . pantoprazole (PROTONIX) 40 MG tablet TAKE 1 TABLET TWICE A DAY 180 tablet 1  . Pitavastatin Calcium (LIVALO) 1 MG TABS Take 1 tablet (1 mg total) by mouth daily. 90 tablet 1  . traMADol (ULTRAM) 50 MG tablet TAKE ONE TABLET BY MOUTH EVERY 8 HOURS AS NEEDED 90 tablet 0   No current facility-administered medications for this visit.     Allergies:   Bee venom; Lipitor [atorvastatin]; Morphine; Hydrocodone; and Pravastatin    Social History:  The patient  reports that he quit smoking about 26 years ago. His smoking use included cigarettes. He has a 30.00 pack-year smoking history. He has never used smokeless tobacco. He reports that he drinks about 1.8 oz of alcohol per week. He reports that he does not use drugs.   Family History:  The patient's family history includes ADD / ADHD in his daughter; Colon polyps in his father; Diabetes in his brother, brother, brother, mother, and sister; Heart attack in his brother and brother; Heart attack (age of onset: 57) in his father; Heart disease in his brother, brother, brother, and  brother; Hyperlipidemia in his mother; Hypertension in his father, maternal grandmother, and mother; Obesity in his brother; Other in his brother; Stroke in his father and mother.    ROS:  General:no colds or fevers, no weight changes Skin:no rashes or ulcers HEENT:no blurred vision, no congestion CV:see HPI PUL:see HPI GI:no diarrhea constipation or melena, no indigestion on protonix GU:no hematuria, no dysuria MS:no joint pain, no claudication Neuro:no syncope, no lightheadedness Endo:+  diabetes, no thyroid disease  Wt Readings from Last 3 Encounters:  10/29/17 249 lb 12.8 oz (113.3 kg)  10/04/17 247 lb 6.4 oz (112.2 kg)  08/30/17 240 lb (108.9 kg)     PHYSICAL EXAM: VS:  BP (!) 144/90   Pulse 99   Ht 5\' 8"  (1.727 m)   Wt 249 lb 12.8 oz (113.3 kg)   SpO2 96%   BMI 37.98 kg/m  , BMI Body mass index is 37.98 kg/m. General:Pleasant affect, NAD Skin:Warm and dry, brisk capillary refill HEENT:normocephalic, sclera clear, mucus membranes moist Neck:supple, no JVD, no bruits  Heart:S1S2 RRR without murmur, gallup, rub or click Lungs:clear without rales, rhonchi, or wheezes IOM:BTDH, non tender, + BS, do not palpate liver spleen or masses Ext:no lower ext edema, 2+ pedal pulses, 2+ radial pulses Neuro:alert and oriented, MAE, follows commands, + facial symmetry    EKG:  EKG is ordered today. The ekg ordered today demonstrates SR occ multifocal PVCs no acute changes.    Recent Labs: 06/25/2017: ALT 16; BUN 12; Creatinine, Ser 0.94; Hemoglobin 12.1; Magnesium 1.3; Platelets 231.0; Potassium 4.8; Sodium 140; TSH 1.56    Lipid Panel    Component Value Date/Time   CHOL 126 06/25/2017 1053   TRIG 94.0 06/25/2017 1053   HDL 55.80 06/25/2017 1053   CHOLHDL 2 06/25/2017 1053   VLDL 18.8 06/25/2017 1053   LDLCALC 51 06/25/2017 1053   LDLDIRECT 85.0 11/17/2016 0928       Other studies Reviewed: Additional studies/ records that were reviewed today include: . Echo  12/2011 Study Conclusions  - Left ventricle: The cavity size was normal. Systolic function was normal. The estimated ejection fraction was in the range of 55% to 60%. Wall motion was normal; there were no regional wall motion abnormalities. Left ventricular diastolic function parameters were normal. - Aortic valve: Mild regurgitation. - Mitral valve: Mild regurgitation. - Left atrium: The atrium was mildly dilated. - Right ventricle: Systolic function was normal. - Pulmonary arteries: Systolic pressure was within the normal range. Impressions:  - Normal study.  12/23/11 Cardiac cath Impression: 1. Moderate disease in RCA with FFR of 0.89 suggesting no significant reduction in flow down the RCA.  2. See diagnostic report for other details.    Recommendations: Medical management of moderate CAD.        Complications:  None; patient tolerated the procedure well.          ASSESSMENT AND PLAN:  1.  Pre-op exam  For knee surgery - low risk procedure pt is low risk with no angina and no hx MI.  Follow up with Dr. Marlou Porch in 1 year.   2.. Non obstructive CAD  3.  DM-2 per IM   4.  HLD per PCP.   Current medicines are reviewed with the patient today.  The patient Has no concerns regarding medicines.  The following changes have been made:  See above Labs/ tests ordered today include:see above  Disposition:   FU:  see above  Signed, Cecilie Kicks, NP  10/29/2017 2:16 PM    [facility] [place], [place], [place], [place] Phone: [phone]; Fax: [phone]

## 2017-10-29 NOTE — Patient Instructions (Signed)
Medication Instructions:  Your provider recommends that you continue on your current medications as directed. Please refer to the Current Medication list given to you today.    Labwork: None  Testing/Procedures: None  Follow-Up: Your provider recommends that you schedule a follow-up appointment AS NEEDED with Dr. Marlou Porch!  Any Other Special Instructions Will Be Listed Below (If Applicable). You have been cleared for surgery from a cardiac standpoint!    If you need a refill on your cardiac medications before your next appointment, please call your pharmacy.

## 2017-10-30 ENCOUNTER — Other Ambulatory Visit: Payer: Self-pay | Admitting: Family Medicine

## 2017-11-01 ENCOUNTER — Telehealth: Payer: Self-pay | Admitting: *Deleted

## 2017-11-01 NOTE — Telephone Encounter (Signed)
Received FMLA/STD paperwork from [employer] [place], a [employer], c/o [employer], completed as much as possible [also patient typed in most responses himself and left a 3-page 'documentary' of his history starting in 1975]; forwarded to provider/SLS 07/15

## 2017-11-01 NOTE — Telephone Encounter (Signed)
Patient informed paperwork ready for p/u during regular business hours, understood & agreed/SLS 07/15

## 2017-11-08 ENCOUNTER — Other Ambulatory Visit: Payer: Self-pay | Admitting: Family Medicine

## 2017-11-08 NOTE — Telephone Encounter (Signed)
Requesting:Tramadol Contract:yes UDS:low risk next screen 12/27/17 Last OV:10/04/17 Next OV:11/09/17 Last Refill:10/07/17  #[id number] Database:   Please advise

## 2017-11-09 ENCOUNTER — Encounter: Payer: Self-pay | Admitting: Family Medicine

## 2017-11-09 ENCOUNTER — Ambulatory Visit (INDEPENDENT_AMBULATORY_CARE_PROVIDER_SITE_OTHER): Admitting: Family Medicine

## 2017-11-09 DIAGNOSIS — E559 Vitamin D deficiency, unspecified: Secondary | ICD-10-CM

## 2017-11-09 DIAGNOSIS — M25561 Pain in right knee: Secondary | ICD-10-CM

## 2017-11-09 DIAGNOSIS — I10 Essential (primary) hypertension: Secondary | ICD-10-CM | POA: Diagnosis not present

## 2017-11-09 DIAGNOSIS — M25562 Pain in left knee: Secondary | ICD-10-CM

## 2017-11-09 DIAGNOSIS — E118 Type 2 diabetes mellitus with unspecified complications: Secondary | ICD-10-CM | POA: Diagnosis not present

## 2017-11-09 DIAGNOSIS — G8929 Other chronic pain: Secondary | ICD-10-CM

## 2017-11-09 DIAGNOSIS — E782 Mixed hyperlipidemia: Secondary | ICD-10-CM | POA: Diagnosis not present

## 2017-11-09 LAB — LIPID PANEL
Cholesterol: 144 mg/dL (ref 0–200)
HDL: 49 mg/dL (ref >39.00)
LDL Cholesterol: 64 mg/dL (ref 0–99)
NonHDL: 95.22
Total CHOL/HDL Ratio: 3
Triglycerides: 157 mg/dL — ABNORMAL HIGH (ref 0.0–149.0)
VLDL: 31.4 mg/dL (ref 0.0–40.0)

## 2017-11-09 LAB — COMPREHENSIVE METABOLIC PANEL
ALT: 19 U/L (ref 0–53)
AST: 24 U/L (ref 0–37)
Albumin: 4.3 g/dL (ref 3.5–5.2)
Alkaline Phosphatase: 40 U/L (ref 39–117)
BUN: 14 mg/dL (ref 6–23)
CO2: 26 mEq/L (ref 19–32)
Calcium: 9 mg/dL (ref 8.4–10.5)
Chloride: 106 mEq/L (ref 96–112)
Creatinine, Ser: 1.05 mg/dL (ref 0.40–1.50)
GFR: 76.04 mL/min (ref >60.00)
Glucose, Bld: 218 mg/dL — ABNORMAL HIGH (ref 70–99)
Potassium: 4.5 mEq/L (ref 3.5–5.1)
Sodium: 141 mEq/L (ref 135–145)
Total Bilirubin: 0.5 mg/dL (ref 0.2–1.2)
Total Protein: 6.9 g/dL (ref 6.0–8.3)

## 2017-11-09 LAB — HEMOGLOBIN A1C: Hgb A1c MFr Bld: 6.6 % — ABNORMAL HIGH (ref 4.6–6.5)

## 2017-11-09 LAB — CBC
HCT: 35.3 % — ABNORMAL LOW (ref 39.0–52.0)
Hemoglobin: 11.4 g/dL — ABNORMAL LOW (ref 13.0–17.0)
MCHC: 32.3 g/dL (ref 30.0–36.0)
MCV: 81.5 fl (ref 78.0–100.0)
Platelets: 236 10*3/uL (ref 150.0–400.0)
RBC: 4.33 Mil/uL (ref 4.22–5.81)
RDW: 15.4 % (ref 11.5–15.5)
WBC: 4.9 10*3/uL (ref 4.0–10.5)

## 2017-11-09 LAB — TSH: TSH: 0.89 u[IU]/mL (ref 0.35–4.50)

## 2017-11-09 LAB — VITAMIN D 25 HYDROXY (VIT D DEFICIENCY, FRACTURES): VITD: 27.42 ng/mL — ABNORMAL LOW (ref 30.00–100.00)

## 2017-11-09 NOTE — Assessment & Plan Note (Signed)
hgba1c acceptable, minimize simple carbs. Increase exercise as tolerated.  

## 2017-11-09 NOTE — Assessment & Plan Note (Signed)
Monitor and supplement 

## 2017-11-09 NOTE — Assessment & Plan Note (Addendum)
Right knee surgery this Friday and continues to struggle with daily debilitating pain in his knees but also in his hips, ankles, and back. He will likely undergo further surgeries once he recovers from this surgery. Will be out of work til at least December and with his numerous medical concerns and physical pains which require chronic meds he may not be able to return to work.

## 2017-11-09 NOTE — Assessment & Plan Note (Signed)
Well controlled, no changes to meds. Encouraged heart healthy diet such as the DASH diet and exercise as tolerated.  °

## 2017-11-09 NOTE — Assessment & Plan Note (Signed)
Encouraged heart healthy diet, increase exercise, avoid trans fats, consider a krill oil cap daily 

## 2017-11-09 NOTE — Patient Instructions (Signed)

## 2017-11-09 NOTE — Progress Notes (Signed)
Subjective:    Patient ID: Brent [name], male    DOB: [birth year], 62 y.o.   MRN: [id number]  No chief complaint on file.   HPI Patient is in today for a 6 week up follow up. He is scheduled to have surgery on his left knee on July 29th to address the nerve pain he has been experiencing since his knee replacement in 2017. 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 These conditions and the related pain are being managed by his orthopedist. His pain is managed on tramadol, gabapentin, and robaxin and expressed his desire to stop taking tramadol as it makes him sleepy.   He reports no chest pain, shortness of breath, fevers, headaches, nausea, vomiting, diarrhea or constipation.  Past Medical History:  Diagnosis Date  . Acid reflux disease   . ACID REFLUX DISEASE 07/05/2007  . Anemia   . Atherosclerosis   . Benign neoplasm of colon 07/05/2007  . Bilateral hip pain 10/05/2016  . Breast pain, left 11/17/2011  . Chest pain    a. Reportedly negative dobut echo performed prior to gastric bypass in 03/2011;  b. 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 12/2011 Cath: LM nl, LAD 50p, D1 75m, LCX min irregs, OM3 30, RCA 25p, 58m (FFR 0.99->0.89), PDA 30, EF 65%, Med Rx.  . COLONIC POLYPS, HX OF 10/29/2008  . Diarrhea 06/13/2010  . Diverticulosis of colon   . DIVERTICULOSIS, COLON 10/29/2008  . DM (diabetes mellitus), type 2, uncontrolled ([place])   . GI bleed   . Gout 03/04/2013  . Hearing loss 05/24/2013   Previous audiology evaluation completely.  [name] HTN (hypertension) 07/14/2010  . Hx of colonic polyps   . HYPERSOMNIA, ASSOCIATED WITH SLEEP APNEA 07/26/2008  . Hypertension 07/14/2010  . 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 Knee pain, left 10/10/2010  . Morbid obesity ([place]) 03/27/2010   a. 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  [name] Neck pain 03/2015  . Other and unspecified hyperlipidemia 11/16/2012    . Preventative health care 11/17/2011  . Renal stone 11/2013  . Sleep apnea    a. CPAP  . Spinal stenosis   . Tear of meniscus of left knee 2012    Past Surgical History:  Procedure Laterality Date  . ANKLE SURGERY  1994  . CARDIAC CATHETERIZATION     denies any chest pain in the past 2 years  . COLONOSCOPY    . colonoscopy polyps    . ESOPHAGOGASTRODUODENOSCOPY N/A 03/27/2013   Procedure: ESOPHAGOGASTRODUODENOSCOPY (EGD);  Surgeon: Irene Shipper, MD;  Location: [facility];  Service: Endoscopy;  Laterality: N/A;  . GASTRIC BYPASS    . HERNIA REPAIR    . KNEE ARTHROSCOPY  11/06/10   Left, torn meniscus (repaired)  . LEFT HEART CATHETERIZATION WITH CORONARY ANGIOGRAM N/A 12/23/2011   Procedure: LEFT HEART CATHETERIZATION WITH CORONARY ANGIOGRAM;  Surgeon: Minus Breeding, MD;  Location: [facility] CATH LAB;  Service: Cardiovascular;  Laterality: N/A;  . right knee arthroscopy Right 07/05/14   Dr. Hart Robinsons, [place].  . TONSILLECTOMY  age 50  . TOTAL KNEE ARTHROPLASTY Right 01/20/2016   Procedure: RIGHT TOTAL KNEE ARTHROPLASTY;  Surgeon: Paralee Cancel, MD;  Location: [facility];  Service: Orthopedics;  Laterality: Right;  . UPPER GI ENDOSCOPY  03/27/13    Family History  Problem Relation Age of Onset  . Diabetes Mother   . Hypertension Mother   .  Stroke Mother   . Hyperlipidemia Mother   . Hypertension Father   . Colon polyps Father   . Heart attack Father 20  . Stroke Father   . Heart attack Brother   . Diabetes Brother   . Heart disease Brother   . Heart attack Brother        Multiple  . Diabetes Brother   . Other Brother        heart problems  . Heart disease Brother   . Diabetes Sister   . Obesity Brother   . Diabetes Brother   . Heart disease Brother   . Hypertension Maternal Grandmother   . ADD / ADHD Daughter   . Heart disease Brother   . Stomach cancer Neg Hx   . Colon cancer Neg Hx   . Esophageal cancer Neg Hx   . Rectal cancer Neg Hx     Social History    Socioeconomic History  . Marital status: Married    Spouse name: Not on file  . Number of children: 2  . Years of education: Not on file  . Highest education level: Not on file  Occupational History  . Occupation: Product manager: [employer]  Social Needs  . Financial resource strain: Not on file  . Food insecurity:    Worry: Not on file    Inability: Not on file  . Transportation needs:    Medical: Not on file    Non-medical: Not on file  Tobacco Use  . Smoking status: Former Smoker    Packs/day: 1.50    Years: 20.00    Pack years: 30.00    Types: Cigarettes    Last attempt to quit: 04/21/1991    Years since quitting: 26.5  . Smokeless tobacco: Never Used  Substance and Sexual Activity  . Alcohol use: Yes    Alcohol/week: 1.8 oz    Types: 3 Cans of beer per week    Comment: social  . Drug use: No  . Sexual activity: Not on file  Lifestyle  . Physical activity:    Days per week: Not on file    Minutes per session: Not on file  . Stress: Not on file  Relationships  . Social connections:    Talks on phone: Not on file    Gets together: Not on file    Attends religious service: Not on file    Active member of club or organization: Not on file    Attends meetings of clubs or organizations: Not on file    Relationship status: Not on file  . Intimate partner violence:    Fear of current or ex partner: Not on file    Emotionally abused: Not on file    Physically abused: Not on file    Forced sexual activity: Not on file  Other Topics Concern  . Not on file  Social History Narrative   Lives with wife in [place].  Does not routinely exercise.    Outpatient Medications Prior to Visit  Medication Sig Dispense Refill  . Cholecalciferol (VITAMIN D-3) 5000 UNITS TABS Take 1 tablet by mouth every morning.     . Cyanocobalamin (VITAMIN B-12) 1000 MCG SUBL Place 1 each under the tongue every morning.     [name] EPINEPHrine (EPIPEN 2-PAK) 0.3 mg/0.3 mL IJ SOAJ  injection Inject 0.3 mLs (0.3 mg total) into the muscle once. 3 Device 1  . fenofibrate 160 MG tablet TAKE 1 TABLET DAILY 90 tablet  1  . gabapentin (NEURONTIN) 300 MG capsule Take 1 capsule (300 mg total) by mouth 3 (three) times daily. 90 capsule 3  . glucose blood test strip Use as directed once daily to check blood sugar.  Diagnosis code E11.9 100 each 12  . Krill Oil 1000 MG CAPS Take 1 tablet by mouth 2 (two) times daily.     [name] LIVALO 1 MG TABS TAKE 1 TABLET DAILY 90 tablet 1  . Magnesium 100 MG CAPS Take 1 capsule (100 mg total) by mouth daily. 30 capsule 1  . metFORMIN (GLUCOPHAGE) 500 MG tablet TAKE 1 TABLET FOUR TIMES A DAY 360 tablet 1  . methocarbamol (ROBAXIN) 500 MG tablet TAKE ONE TABLET BY MOUTH EVERY 6 HOURS AS NEEDED FOR MUSCLE SPASMS 120 tablet 5  . metoprolol tartrate (LOPRESSOR) 25 MG tablet TAKE ONE-HALF (1/2) TABLET TWICE A DAY 90 tablet 1  . mometasone (ELOCON) 0.1 % cream Apply 1 application topically daily. 50 g 1  . Multiple Vitamin (MULTIVITAMIN) tablet Take 1 tablet by mouth 2 (two) times daily. BARIATRIC VIT    . pantoprazole (PROTONIX) 40 MG tablet TAKE 1 TABLET TWICE A DAY 180 tablet 1  . traMADol (ULTRAM) 50 MG tablet TAKE ONE TABLET BY MOUTH EVERY 8 HOURS AS NEEDED 90 tablet 0   No facility-administered medications prior to visit.     Allergies  Allergen Reactions  . Bee Venom Anaphylaxis  . Lipitor [Atorvastatin]     Myalgias, memory changes  . Morphine     hyperactive  . Hydrocodone Itching  . Pravastatin Other (See Comments)    Made joints hurt. --also simvastatin     ROS General: no fatigue, no weight loss/gain. No fever or chills. Skin: No rashes or skin changes Neuro: No changes in special senses HEENT: No congestion, hearing loss, or discharge Cardio: no chest pain, palpitations, lower extremity swelling. Pulmonary: No shortness of breath or cough GI: No abdominal pain, blood in stool, constipation, diarrhea, nausea, vomiting GU: No  hematuria or dysuria MSK: Per HPI. Positive for pulled muscle in left leg. Allergies: No new allergies Psych: No mood changes. No insomnia     Objective:    Physical Exam  Constitutional: He is oriented to person, place, and time. No distress.  HENT:  Head: Normocephalic and atraumatic.  Eyes: Conjunctivae are normal.  Neck: Neck supple. No thyromegaly present.  Cardiovascular: Normal rate, regular rhythm and normal heart sounds.  No murmur heard. Pulmonary/Chest: Effort normal and breath sounds normal. No respiratory distress.  Abdominal: He exhibits no distension and no mass. There is no tenderness.  Musculoskeletal: He exhibits no edema.  Neurological: He is alert and oriented to person, place, and time.  Skin: Skin is warm.  Psychiatric: Judgment normal.   BP (!) 118/58 (BP Location: Left Arm, Patient Position: Sitting, Cuff Size: Normal)   Pulse 77   Temp 98.1 F (36.7 C) (Oral)   Resp 18   Ht 5\' 8"  (1.727 m)   Wt 244 lb 6.4 oz (110.9 kg)   SpO2 97%   BMI 37.16 kg/m  Wt Readings from Last 3 Encounters:  11/09/17 244 lb 6.4 oz (110.9 kg)  10/29/17 249 lb 12.8 oz (113.3 kg)  10/04/17 247 lb 6.4 oz (112.2 kg)     Lab Results  Component Value Date   WBC 4.3 06/25/2017   HGB 12.1 (L) 06/25/2017   HCT 37.2 (L) 06/25/2017   PLT 231.0 06/25/2017   GLUCOSE 153 (H) 06/25/2017   CHOL  126 06/25/2017   TRIG 94.0 06/25/2017   HDL 55.80 06/25/2017   LDLDIRECT 85.0 11/17/2016   LDLCALC 51 06/25/2017   ALT 16 06/25/2017   AST 19 06/25/2017   NA 140 06/25/2017   K 4.8 06/25/2017   CL 104 06/25/2017   CREATININE 0.94 06/25/2017   BUN 12 06/25/2017   CO2 29 06/25/2017   TSH 1.56 06/25/2017   PSA 1.55 06/25/2017   INR 1.13 03/29/2013   HGBA1C 6.5 06/25/2017   MICROALBUR 1.1 12/02/2015    Lab Results  Component Value Date   TSH 1.56 06/25/2017   Lab Results  Component Value Date   WBC 4.3 06/25/2017   HGB 12.1 (L) 06/25/2017   HCT 37.2 (L) 06/25/2017   MCV  82.6 06/25/2017   PLT 231.0 06/25/2017   Lab Results  Component Value Date   NA 140 06/25/2017   K 4.8 06/25/2017   CO2 29 06/25/2017   GLUCOSE 153 (H) 06/25/2017   BUN 12 06/25/2017   CREATININE 0.94 06/25/2017   BILITOT 0.5 06/25/2017   ALKPHOS 34 (L) 06/25/2017   AST 19 06/25/2017   ALT 16 06/25/2017   PROT 6.5 06/25/2017   ALBUMIN 4.4 06/25/2017   CALCIUM 9.5 06/25/2017   ANIONGAP 8 01/21/2016   GFR 86.51 06/25/2017   Lab Results  Component Value Date   CHOL 126 06/25/2017   Lab Results  Component Value Date   HDL 55.80 06/25/2017   Lab Results  Component Value Date   LDLCALC 51 06/25/2017   Lab Results  Component Value Date   TRIG 94.0 06/25/2017   Lab Results  Component Value Date   CHOLHDL 2 06/25/2017   Lab Results  Component Value Date   HGBA1C 6.5 06/25/2017       Assessment & Plan:   Problem List Items Addressed This Visit      Cardiovascular and Mediastinum   Essential hypertension    Well controlled, no changes to meds. Encouraged heart healthy diet such as the DASH diet and exercise as tolerated.       Relevant Orders   CBC   Comprehensive metabolic panel   TSH     Endocrine   T2DM (type 2 diabetes mellitus) (HCC)    hgba1c acceptable, minimize simple carbs. Increase exercise as tolerated.       Relevant Orders   Hemoglobin A1c     Other   Chronic pain of both knees    Right knee surgery this Friday       Hyperlipidemia, mixed    Encouraged heart healthy diet, increase exercise, avoid trans fats, consider a krill oil cap daily      Relevant Orders   Lipid panel   Vitamin D deficiency    Monitor and supplement      Relevant Orders   VITAMIN D 25 Hydroxy (Vit-D Deficiency, Fractures)      I am having Brent [name] "Don" maintain his multivitamin, Vitamin B-12, Vitamin D-3, Krill Oil, EPINEPHrine, mometasone, glucose blood, metFORMIN, Magnesium, pantoprazole, fenofibrate, methocarbamol, gabapentin, metoprolol  tartrate, LIVALO, and traMADol.  No orders of the defined types were placed in this encounter.    Verdis Frederickson, Medical Student   Patient seen with and examined with student.  Agree with documentation See separate note for further documentation

## 2017-11-10 MED ORDER — VITAMIN D (ERGOCALCIFEROL) 1.25 MG (50000 UNIT) PO CAPS: 50000.0000 [IU] | ORAL_CAPSULE | ORAL | 4 refills | Status: DC

## 2017-11-14 NOTE — Progress Notes (Signed)
Patient ID: Brent [name], male   DOB: 31-Mar-1956, 61 y.o.   MRN: [id number]   Subjective:    Patient ID: Brent [name], male    DOB: [birth year], 62 y.o.   MRN: [id number]  No chief complaint on file.   HPI Patient is in today for follow up and is preparing for orthopaedic surgery this Friday. He will undergo right TKR. Once he has recovered form this surgery there are plans for further surgeries in the future to address other pain and debility. Notes he has significant fatigue and he feels a good bit of it is related to his medication use especially his Tramadol and Robaxin. No recent fall or injury. Denies CP/palp/SOB/HA/congestion/fevers/GI or GU c/o. Taking meds as prescribed  Past Medical History:  Diagnosis Date  . Acid reflux disease   . ACID REFLUX DISEASE 07/05/2007  . Anemia   . Atherosclerosis   . Benign neoplasm of colon 07/05/2007  . Bilateral hip pain 10/05/2016  . Breast pain, left 11/17/2011  . Chest pain    a. Reportedly negative dobut echo performed prior to gastric bypass in 03/2011;  b. 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 12/2011 Cath: LM nl, LAD 50p, D1 47m, LCX min irregs, OM3 30, RCA 25p, 36m (FFR 0.99->0.89), PDA 30, EF 65%, Med Rx.  . COLONIC POLYPS, HX OF 10/29/2008  . Diarrhea 06/13/2010  . Diverticulosis of colon   . DIVERTICULOSIS, COLON 10/29/2008  . DM (diabetes mellitus), type 2, uncontrolled ([place])   . GI bleed   . Gout 03/04/2013  . Hearing loss 05/24/2013   Previous audiology evaluation completely.  [name] HTN (hypertension) 07/14/2010  . Hx of colonic polyps   . HYPERSOMNIA, ASSOCIATED WITH SLEEP APNEA 07/26/2008  . Hypertension 07/14/2010  . 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 Knee pain, left 10/10/2010  . Morbid obesity ([place]) 03/27/2010   a. 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  [name] Neck pain 03/2015  . Other and unspecified hyperlipidemia 11/16/2012  . Preventative health care 11/17/2011  . Renal stone 11/2013  . Sleep apnea    a. CPAP  . Spinal stenosis   .  Tear of meniscus of left knee 2012    Past Surgical History:  Procedure Laterality Date  . ANKLE SURGERY  1994  . CARDIAC CATHETERIZATION     denies any chest pain in the past 2 years  . COLONOSCOPY    . colonoscopy polyps    . ESOPHAGOGASTRODUODENOSCOPY N/A 03/27/2013   Procedure: ESOPHAGOGASTRODUODENOSCOPY (EGD);  Surgeon: Irene Shipper, MD;  Location: [facility];  Service: Endoscopy;  Laterality: N/A;  . GASTRIC BYPASS    . HERNIA REPAIR    . KNEE ARTHROSCOPY  11/06/10   Left, torn meniscus (repaired)  . LEFT HEART CATHETERIZATION WITH CORONARY ANGIOGRAM N/A 12/23/2011   Procedure: LEFT HEART CATHETERIZATION WITH CORONARY ANGIOGRAM;  Surgeon: Minus Breeding, MD;  Location: [facility] CATH LAB;  Service: Cardiovascular;  Laterality: N/A;  . right knee arthroscopy Right 07/05/14   Dr. Hart Robinsons, [place].  . TONSILLECTOMY  age 21  . TOTAL KNEE ARTHROPLASTY Right 01/20/2016   Procedure: RIGHT TOTAL KNEE ARTHROPLASTY;  Surgeon: Paralee Cancel, MD;  Location: [facility];  Service: Orthopedics;  Laterality: Right;  . UPPER GI ENDOSCOPY  03/27/13    Family History  Problem Relation Age of Onset  . Diabetes Mother   . Hypertension Mother   . Stroke Mother   . Hyperlipidemia Mother   . Hypertension Father   . Colon  polyps Father   . Heart attack Father 33  . Stroke Father   . Heart attack Brother   . Diabetes Brother   . Heart disease Brother   . Heart attack Brother        Multiple  . Diabetes Brother   . Other Brother        heart problems  . Heart disease Brother   . Diabetes Sister   . Obesity Brother   . Diabetes Brother   . Heart disease Brother   . Hypertension Maternal Grandmother   . ADD / ADHD Daughter   . Heart disease Brother   . Stomach cancer Neg Hx   . Colon cancer Neg Hx   . Esophageal cancer Neg Hx   . Rectal cancer Neg Hx     Social History   Socioeconomic History  . Marital status: Married    Spouse name: Not on file  . Number of children: 2  . Years of  education: Not on file  . Highest education level: Not on file  Occupational History  . Occupation: Product manager: [employer]  Social Needs  . Financial resource strain: Not on file  . Food insecurity:    Worry: Not on file    Inability: Not on file  . Transportation needs:    Medical: Not on file    Non-medical: Not on file  Tobacco Use  . Smoking status: Former Smoker    Packs/day: 1.50    Years: 20.00    Pack years: 30.00    Types: Cigarettes    Last attempt to quit: 04/21/1991    Years since quitting: 26.5  . Smokeless tobacco: Never Used  Substance and Sexual Activity  . Alcohol use: Yes    Alcohol/week: 1.8 oz    Types: 3 Cans of beer per week    Comment: social  . Drug use: No  . Sexual activity: Not on file  Lifestyle  . Physical activity:    Days per week: Not on file    Minutes per session: Not on file  . Stress: Not on file  Relationships  . Social connections:    Talks on phone: Not on file    Gets together: Not on file    Attends religious service: Not on file    Active member of club or organization: Not on file    Attends meetings of clubs or organizations: Not on file    Relationship status: Not on file  . Intimate partner violence:    Fear of current or ex partner: Not on file    Emotionally abused: Not on file    Physically abused: Not on file    Forced sexual activity: Not on file  Other Topics Concern  . Not on file  Social History Narrative   Lives with wife in [place].  Does not routinely exercise.    Outpatient Medications Prior to Visit  Medication Sig Dispense Refill  . Cholecalciferol (VITAMIN D-3) 5000 UNITS TABS Take 1 tablet by mouth every morning.     . Cyanocobalamin (VITAMIN B-12) 1000 MCG SUBL Place 1 each under the tongue every morning.     [name] EPINEPHrine (EPIPEN 2-PAK) 0.3 mg/0.3 mL IJ SOAJ injection Inject 0.3 mLs (0.3 mg total) into the muscle once. 3 Device 1  . fenofibrate 160 MG tablet TAKE 1 TABLET  DAILY 90 tablet 1  . gabapentin (NEURONTIN) 300 MG capsule Take 1 capsule (300 mg total) by mouth  3 (three) times daily. 90 capsule 3  . glucose blood test strip Use as directed once daily to check blood sugar.  Diagnosis code E11.9 100 each 12  . Krill Oil 1000 MG CAPS Take 1 tablet by mouth 2 (two) times daily.     [name] LIVALO 1 MG TABS TAKE 1 TABLET DAILY 90 tablet 1  . Magnesium 100 MG CAPS Take 1 capsule (100 mg total) by mouth daily. 30 capsule 1  . metFORMIN (GLUCOPHAGE) 500 MG tablet TAKE 1 TABLET FOUR TIMES A DAY 360 tablet 1  . methocarbamol (ROBAXIN) 500 MG tablet TAKE ONE TABLET BY MOUTH EVERY 6 HOURS AS NEEDED FOR MUSCLE SPASMS 120 tablet 5  . metoprolol tartrate (LOPRESSOR) 25 MG tablet TAKE ONE-HALF (1/2) TABLET TWICE A DAY 90 tablet 1  . mometasone (ELOCON) 0.1 % cream Apply 1 application topically daily. 50 g 1  . Multiple Vitamin (MULTIVITAMIN) tablet Take 1 tablet by mouth 2 (two) times daily. BARIATRIC VIT    . pantoprazole (PROTONIX) 40 MG tablet TAKE 1 TABLET TWICE A DAY 180 tablet 1  . traMADol (ULTRAM) 50 MG tablet TAKE ONE TABLET BY MOUTH EVERY 8 HOURS AS NEEDED 90 tablet 0   No facility-administered medications prior to visit.     Allergies  Allergen Reactions  . Bee Venom Anaphylaxis  . Lipitor [Atorvastatin]     Myalgias, memory changes  . Morphine     hyperactive  . Hydrocodone Itching  . Pravastatin Other (See Comments)    Made joints hurt. --also simvastatin     Review of Systems  Constitutional: Positive for malaise/fatigue. Negative for fever.  HENT: Negative for congestion.   Eyes: Negative for blurred vision.  Respiratory: Negative for shortness of breath.   Cardiovascular: Negative for chest pain, palpitations and leg swelling.  Gastrointestinal: Negative for abdominal pain, blood in stool and nausea.  Genitourinary: Negative for dysuria and frequency.  Musculoskeletal: Positive for back pain and joint pain. Negative for falls.  Skin: Negative  for rash.  Neurological: Negative for dizziness, loss of consciousness and headaches.  Endo/Heme/Allergies: Negative for environmental allergies.  Psychiatric/Behavioral: Negative for depression. The patient is not nervous/anxious.        Objective:    Physical Exam  Constitutional: He is oriented to person, place, and time. No distress.  HENT:  Head: Normocephalic and atraumatic.  Eyes: Conjunctivae are normal.  Neck: Neck supple. No thyromegaly present.  Cardiovascular: Normal rate, regular rhythm and normal heart sounds.  No murmur heard. Pulmonary/Chest: Effort normal and breath sounds normal. No respiratory distress.  Abdominal: He exhibits no distension and no mass. There is no tenderness.  Musculoskeletal: He exhibits no edema.  Neurological: He is alert and oriented to person, place, and time.  Skin: Skin is warm.  Psychiatric: Judgment normal.    BP (!) 118/58 (BP Location: Left Arm, Patient Position: Sitting, Cuff Size: Normal)   Pulse 77   Temp 98.1 F (36.7 C) (Oral)   Resp 18   Ht 5\' 8"  (1.727 m)   Wt 244 lb 6.4 oz (110.9 kg)   SpO2 97%   BMI 37.16 kg/m  Wt Readings from Last 3 Encounters:  11/09/17 244 lb 6.4 oz (110.9 kg)  10/29/17 249 lb 12.8 oz (113.3 kg)  10/04/17 247 lb 6.4 oz (112.2 kg)     Lab Results  Component Value Date   WBC 4.9 11/09/2017   HGB 11.4 (L) 11/09/2017   HCT 35.3 (L) 11/09/2017   PLT 236.0 11/09/2017  GLUCOSE 218 (H) 11/09/2017   CHOL 144 11/09/2017   TRIG 157.0 (H) 11/09/2017   HDL 49.00 11/09/2017   LDLDIRECT 85.0 11/17/2016   LDLCALC 64 11/09/2017   ALT 19 11/09/2017   AST 24 11/09/2017   NA 141 11/09/2017   K 4.5 11/09/2017   CL 106 11/09/2017   CREATININE 1.05 11/09/2017   BUN 14 11/09/2017   CO2 26 11/09/2017   TSH 0.89 11/09/2017   PSA 1.55 06/25/2017   INR 1.13 03/29/2013   HGBA1C 6.6 (H) 11/09/2017   MICROALBUR 1.1 12/02/2015    Lab Results  Component Value Date   TSH 0.89 11/09/2017   Lab Results   Component Value Date   WBC 4.9 11/09/2017   HGB 11.4 (L) 11/09/2017   HCT 35.3 (L) 11/09/2017   MCV 81.5 11/09/2017   PLT 236.0 11/09/2017   Lab Results  Component Value Date   NA 141 11/09/2017   K 4.5 11/09/2017   CO2 26 11/09/2017   GLUCOSE 218 (H) 11/09/2017   BUN 14 11/09/2017   CREATININE 1.05 11/09/2017   BILITOT 0.5 11/09/2017   ALKPHOS 40 11/09/2017   AST 24 11/09/2017   ALT 19 11/09/2017   PROT 6.9 11/09/2017   ALBUMIN 4.3 11/09/2017   CALCIUM 9.0 11/09/2017   ANIONGAP 8 01/21/2016   GFR 76.04 11/09/2017   Lab Results  Component Value Date   CHOL 144 11/09/2017   Lab Results  Component Value Date   HDL 49.00 11/09/2017   Lab Results  Component Value Date   LDLCALC 64 11/09/2017   Lab Results  Component Value Date   TRIG 157.0 (H) 11/09/2017   Lab Results  Component Value Date   CHOLHDL 3 11/09/2017   Lab Results  Component Value Date   HGBA1C 6.6 (H) 11/09/2017       Assessment & Plan:   Problem List Items Addressed This Visit    T2DM (type 2 diabetes mellitus) ([place])    hgba1c acceptable, minimize simple carbs. Increase exercise as tolerated.       Relevant Orders   Hemoglobin A1c (Completed)   Essential hypertension    Well controlled, no changes to meds. Encouraged heart healthy diet such as the DASH diet and exercise as tolerated.       Relevant Orders   CBC (Completed)   Comprehensive metabolic panel (Completed)   TSH (Completed)   Chronic pain of both knees    Right knee surgery this Friday and continues to struggle with daily debilitating pain in his knees but also in his hips, ankles, and back. He will likely undergo further surgeries once he recovers from this surgery. Will be out of work til at least December and with his numerous medical concerns and physical pains which require chronic meds he may not be able to return to work.       Hyperlipidemia, mixed    Encouraged heart healthy diet, increase exercise, avoid trans  fats, consider a krill oil cap daily      Relevant Orders   Lipid panel (Completed)   Vitamin D deficiency    Monitor and supplement      Relevant Orders   VITAMIN D 25 Hydroxy (Vit-D Deficiency, Fractures) (Completed)      I am having Brent [name] "Don" start on Vitamin D (Ergocalciferol). I am also having him maintain his multivitamin, Vitamin B-12, Vitamin D-3, Krill Oil, EPINEPHrine, mometasone, glucose blood, metFORMIN, Magnesium, pantoprazole, fenofibrate, methocarbamol, gabapentin, metoprolol tartrate, LIVALO, and traMADol.  Meds ordered this encounter  Medications  . Vitamin D, Ergocalciferol, (DRISDOL) 50000 units CAPS capsule    Sig: Take 1 capsule (50,000 Units total) by mouth every 7 (seven) days.    Dispense:  4 capsule    Refill:  4     [name], MD

## 2017-11-15 ENCOUNTER — Encounter: Payer: Self-pay | Admitting: Family Medicine

## 2017-11-25 ENCOUNTER — Telehealth: Payer: Self-pay | Admitting: *Deleted

## 2017-11-25 NOTE — Telephone Encounter (Signed)
Received FMLA/STD paperwork from [facility], fax: [phone], will complete as much as possible; then forward to provider/SLS

## 2017-11-26 NOTE — Telephone Encounter (Signed)
Completed as much as possible; forwarded to provider/SLS 08/09

## 2017-12-02 NOTE — Telephone Encounter (Signed)
Spoke with provider upon RTO this week about continuous incapacitation period dates; she states unsure of beginning date and it was discussed that he was going to take the first semester of school off for recovery. LMOM with contact name and number for return call RE: dates of incapacitated continuous period of leave from work needed [begin date must have, end date if he has] per provider instructions/SLS 08/15

## 2017-12-03 NOTE — Telephone Encounter (Signed)
Patient returned call per CRM   Date/Time Type Contact Phone/Fax         12/02/2017 04:06 PM Phone (Incoming) Gyan, [place]" (Self) [phone] (M) Copy  By Carolyn Stare

## 2017-12-05 ENCOUNTER — Encounter: Payer: Self-pay | Admitting: Family Medicine

## 2017-12-06 ENCOUNTER — Other Ambulatory Visit: Payer: Self-pay | Admitting: Family Medicine

## 2017-12-06 MED ORDER — TRAMADOL HCL 50 MG PO TABS: 50.0000 mg | ORAL_TABLET | Freq: Four times a day (QID) | ORAL | 0 refills | Status: DC | PRN

## 2017-12-06 NOTE — Telephone Encounter (Deleted)
Copied from [place] [id number]. Topic: General - Other >> Dec 02, 2017  4:03 PM Carolyn Stare wrote:  Pt returned Mackey Birchwood called >> Dec 02, 2017  4:32 PM Sheran Luz wrote: Pt called back requesting to speak with sharon.  >> Dec 03, 2017  7:46 AM Rockwell Germany, CMA wrote: Forde Dandy  (Newest Message First)  December 02, 2017  Me    1:05 PM  Note    Spoke with provider upon RTO this week about continuous incapacitation period dates; she states unsure of beginning date and it was discussed that he was going to take the first semester of school off for recovery. LMOM with contact name and number for return call RE: dates of incapacitated continuous period of leave from work needed [begin date must have, end date if he has] per provider instructions/SLS 08/15     November 26, 2017  Me    2:28 PM  Note    Completed as much as possible; forwarded to provider/SLS 08/09    November 25, 2017    2:03 PM  You routed this conversation to Me  Me    2:02 PM  Note    Received FMLA/STD paperwork from [facility], fax: [phone], will complete as much as possible; then forward to provider/SLS

## 2017-12-06 NOTE — Telephone Encounter (Signed)
Copied from [place] [phone]. Topic: General - Other >> Dec 02, 2017  4:03 PM Carolyn Stare wrote:  Pt returned Mackey Birchwood called >> Dec 02, 2017  4:32 PM Sheran Luz wrote: Pt called back requesting to speak with Quadarius Henton.  >> Dec 03, 2017  7:46 AM Rockwell Germany, CMA wrote: Forde Dandy  (Newest Message First)  December 02, 2017  Me    1:05 PM  Note    Spoke with provider upon RTO this week about continuous incapacitation period dates; she states unsure of beginning date and it was discussed that he was going to take the first semester of school off for recovery. LMOM with contact name and number for return call RE: dates of incapacitated continuous period of leave from work needed [begin date must have, end date if he has] per provider instructions/SLS 08/15     November 26, 2017  Me    2:28 PM  Note    Completed as much as possible; forwarded to provider/SLS 08/09    November 25, 2017    2:03 PM  You routed this conversation to Me  Me    2:02 PM  Note    Received FMLA/STD paperwork from [facility], fax: [phone], will complete as much as possible; then forward to provider/SLS

## 2017-12-06 NOTE — Telephone Encounter (Signed)
Spoke with patient regarding Beginning and End dates for LOA, he stated 11/03/17 through 04/05/18; updated and faxed paperwork to [facility] [employer]/SLS 08/19

## 2017-12-06 NOTE — Telephone Encounter (Signed)
Spoke with patient regarding beginning and end dates of LOA, he states 11/03/17 through 04/05/18; he

## 2017-12-27 ENCOUNTER — Ambulatory Visit: Admitting: Family Medicine

## 2018-01-03 ENCOUNTER — Other Ambulatory Visit: Payer: Self-pay | Admitting: Family Medicine

## 2018-01-04 NOTE — Telephone Encounter (Signed)
Requesting:tramadol Contract:yes UDS:low risk next screen 12/27/17 Last OV: 11/09/17 Next OV:02/01/18 Last Refill:12/06/17 #[id number] Database:   Please advise

## 2018-01-09 ENCOUNTER — Other Ambulatory Visit: Payer: Self-pay | Admitting: Family Medicine

## 2018-01-16 ENCOUNTER — Encounter: Payer: Self-pay | Admitting: Family Medicine

## 2018-01-25 ENCOUNTER — Ambulatory Visit (INDEPENDENT_AMBULATORY_CARE_PROVIDER_SITE_OTHER): Admitting: Family Medicine

## 2018-01-25 DIAGNOSIS — M25561 Pain in right knee: Secondary | ICD-10-CM

## 2018-01-25 DIAGNOSIS — G8929 Other chronic pain: Secondary | ICD-10-CM

## 2018-01-25 DIAGNOSIS — I10 Essential (primary) hypertension: Secondary | ICD-10-CM

## 2018-01-25 DIAGNOSIS — M25552 Pain in left hip: Secondary | ICD-10-CM

## 2018-01-25 DIAGNOSIS — M25551 Pain in right hip: Secondary | ICD-10-CM | POA: Diagnosis not present

## 2018-01-25 DIAGNOSIS — Z23 Encounter for immunization: Secondary | ICD-10-CM

## 2018-01-25 DIAGNOSIS — M25519 Pain in unspecified shoulder: Secondary | ICD-10-CM | POA: Diagnosis not present

## 2018-01-25 DIAGNOSIS — M25562 Pain in left knee: Secondary | ICD-10-CM

## 2018-01-25 MED ORDER — METOPROLOL TARTRATE 25 MG PO TABS: 25.0000 mg | ORAL_TABLET | Freq: Two times a day (BID) | ORAL | 1 refills | Status: DC

## 2018-01-25 NOTE — Patient Instructions (Signed)

## 2018-01-30 DIAGNOSIS — M25519 Pain in unspecified shoulder: Secondary | ICD-10-CM | POA: Insufficient documentation

## 2018-01-30 NOTE — Assessment & Plan Note (Signed)
Well controlled, no changes to meds. Encouraged heart healthy diet such as the DASH diet and exercise as tolerated.  °

## 2018-01-30 NOTE — Progress Notes (Signed)
Subjective:    Patient ID: Brent [name], male    DOB: 25-Dec-1955, 62 y.o.   MRN: [id number]  No chief complaint on file.   HPI Patient is in today for evaluation of his pain and high blood pressure. He notes when his Tramadol wears off his blood pressure spikes as high as 170/100. He has had trouble since his right TKR to some extent intermittently. No falls or trauma. Denies CP/palp/SOB/HA/congestion/fevers/GI or GU c/o. Taking meds as prescribed  Past Medical History:  Diagnosis Date  . Acid reflux disease   . ACID REFLUX DISEASE 07/05/2007  . Anemia   . Atherosclerosis   . Benign neoplasm of colon 07/05/2007  . Bilateral hip pain 10/05/2016  . Breast pain, left 11/17/2011  . Chest pain    a. Reportedly negative dobut echo performed prior to gastric bypass in 03/2011;  b. 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 12/2011 Cath: LM nl, LAD 50p, D1 59m, LCX min irregs, OM3 30, RCA 25p, 28m (FFR 0.99->0.89), PDA 30, EF 65%, Med Rx.  . COLONIC POLYPS, HX OF 10/29/2008  . Diarrhea 06/13/2010  . Diverticulosis of colon   . DIVERTICULOSIS, COLON 10/29/2008  . DM (diabetes mellitus), type 2, uncontrolled ([place])   . GI bleed   . Gout 03/04/2013  . Hearing loss 05/24/2013   Previous audiology evaluation completely.  [name] HTN (hypertension) 07/14/2010  . Hx of colonic polyps   . HYPERSOMNIA, ASSOCIATED WITH SLEEP APNEA 07/26/2008  . Hypertension 07/14/2010  . 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 Knee pain, left 10/10/2010  . Morbid obesity ([place]) 03/27/2010   a. 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  [name] Neck pain 03/2015  . Other and unspecified hyperlipidemia 11/16/2012  . Preventative health care 11/17/2011  . Renal stone 11/2013  . Sleep apnea    a. CPAP  . Spinal stenosis   . Tear of meniscus of left knee 2012    Past Surgical History:  Procedure Laterality Date  . ANKLE SURGERY  1994  . CARDIAC CATHETERIZATION     denies any chest pain in the past 2 years  . COLONOSCOPY    . colonoscopy polyps      . ESOPHAGOGASTRODUODENOSCOPY N/A 03/27/2013   Procedure: ESOPHAGOGASTRODUODENOSCOPY (EGD);  Surgeon: Irene Shipper, MD;  Location: [facility];  Service: Endoscopy;  Laterality: N/A;  . GASTRIC BYPASS    . HERNIA REPAIR    . KNEE ARTHROSCOPY  11/06/10   Left, torn meniscus (repaired)  . LEFT HEART CATHETERIZATION WITH CORONARY ANGIOGRAM N/A 12/23/2011   Procedure: LEFT HEART CATHETERIZATION WITH CORONARY ANGIOGRAM;  Surgeon: Minus Breeding, MD;  Location: [facility] CATH LAB;  Service: Cardiovascular;  Laterality: N/A;  . right knee arthroscopy Right 07/05/14   Dr. Hart Robinsons, [place].  . TONSILLECTOMY  age 41  . TOTAL KNEE ARTHROPLASTY Right 01/20/2016   Procedure: RIGHT TOTAL KNEE ARTHROPLASTY;  Surgeon: Paralee Cancel, MD;  Location: [facility];  Service: Orthopedics;  Laterality: Right;  . UPPER GI ENDOSCOPY  03/27/13    Family History  Problem Relation Age of Onset  . Diabetes Mother   . Hypertension Mother   . Stroke Mother   . Hyperlipidemia Mother   . Hypertension Father   . Colon polyps Father   . Heart attack Father 98  . Stroke Father   . Heart attack Brother   . Diabetes Brother   . Heart disease Brother   . Heart attack Brother  Multiple  . Diabetes Brother   . Other Brother        heart problems  . Heart disease Brother   . Diabetes Sister   . Obesity Brother   . Diabetes Brother   . Heart disease Brother   . Hypertension Maternal Grandmother   . ADD / ADHD Daughter   . Heart disease Brother   . Stomach cancer Neg Hx   . Colon cancer Neg Hx   . Esophageal cancer Neg Hx   . Rectal cancer Neg Hx     Social History   Socioeconomic History  . Marital status: Married    Spouse name: Not on file  . Number of children: 2  . Years of education: Not on file  . Highest education level: Not on file  Occupational History  . Occupation: Product manager: [employer]  Social Needs  . Financial resource strain: Not on file  . Food insecurity:    Worry:  Not on file    Inability: Not on file  . Transportation needs:    Medical: Not on file    Non-medical: Not on file  Tobacco Use  . Smoking status: Former Smoker    Packs/day: 1.50    Years: 20.00    Pack years: 30.00    Types: Cigarettes    Last attempt to quit: 04/21/1991    Years since quitting: 26.7  . Smokeless tobacco: Never Used  Substance and Sexual Activity  . Alcohol use: Yes    Alcohol/week: 3.0 standard drinks    Types: 3 Cans of beer per week    Comment: social  . Drug use: No  . Sexual activity: Not on file  Lifestyle  . Physical activity:    Days per week: Not on file    Minutes per session: Not on file  . Stress: Not on file  Relationships  . Social connections:    Talks on phone: Not on file    Gets together: Not on file    Attends religious service: Not on file    Active member of club or organization: Not on file    Attends meetings of clubs or organizations: Not on file    Relationship status: Not on file  . Intimate partner violence:    Fear of current or ex partner: Not on file    Emotionally abused: Not on file    Physically abused: Not on file    Forced sexual activity: Not on file  Other Topics Concern  . Not on file  Social History Narrative   Lives with wife in [place].  Does not routinely exercise.    Outpatient Medications Prior to Visit  Medication Sig Dispense Refill  . Cholecalciferol (VITAMIN D-3) 5000 UNITS TABS Take 1 tablet by mouth every morning.     . Cyanocobalamin (VITAMIN B-12) 1000 MCG SUBL Place 1 each under the tongue every morning.     [name] EPINEPHrine (EPIPEN 2-PAK) 0.3 mg/0.3 mL IJ SOAJ injection Inject 0.3 mLs (0.3 mg total) into the muscle once. 3 Device 1  . fenofibrate 160 MG tablet Take 1 tablet (160 mg total) by mouth daily. 90 tablet 1  . gabapentin (NEURONTIN) 300 MG capsule Take 1 capsule (300 mg total) by mouth 3 (three) times daily. 90 capsule 3  . glucose blood test strip Use as directed once daily to check  blood sugar.  Diagnosis code E11.9 100 each 12  . Krill Oil 1000 MG CAPS Take  1 tablet by mouth 2 (two) times daily.     [name] LIVALO 1 MG TABS TAKE 1 TABLET DAILY 90 tablet 1  . Magnesium 100 MG CAPS Take 1 capsule (100 mg total) by mouth daily. 30 capsule 1  . metFORMIN (GLUCOPHAGE) 500 MG tablet TAKE 1 TABLET FOUR TIMES A DAY 360 tablet 1  . methocarbamol (ROBAXIN) 500 MG tablet TAKE ONE TABLET BY MOUTH EVERY 6 HOURS AS NEEDED FOR MUSCLE SPASMS 120 tablet 5  . mometasone (ELOCON) 0.1 % cream Apply 1 application topically daily. 50 g 1  . Multiple Vitamin (MULTIVITAMIN) tablet Take 1 tablet by mouth 2 (two) times daily. BARIATRIC VIT    . pantoprazole (PROTONIX) 40 MG tablet Take 1 tablet (40 mg total) by mouth 2 (two) times daily before a meal. 180 tablet 3  . traMADol (ULTRAM) 50 MG tablet TAKE 1 TABLET(50 MG) BY MOUTH EVERY 6 HOURS AS NEEDED FOR MODERATE PAIN OR SEVERE PAIN 120 tablet 0  . Vitamin D, Ergocalciferol, (DRISDOL) 50000 units CAPS capsule Take 1 capsule (50,000 Units total) by mouth every 7 (seven) days. 4 capsule 4  . metoprolol tartrate (LOPRESSOR) 25 MG tablet TAKE ONE-HALF (1/2) TABLET TWICE A DAY 90 tablet 1   No facility-administered medications prior to visit.     Allergies  Allergen Reactions  . Bee Venom Anaphylaxis  . Lipitor [Atorvastatin]     Myalgias, memory changes  . Morphine     hyperactive  . Hydrocodone Itching  . Pravastatin Other (See Comments)    Made joints hurt. --also simvastatin     Review of Systems  Constitutional: Negative for fever and malaise/fatigue.  HENT: Negative for congestion.   Eyes: Negative for blurred vision.  Respiratory: Negative for shortness of breath.   Cardiovascular: Negative for chest pain, palpitations and leg swelling.  Gastrointestinal: Negative for abdominal pain, blood in stool and nausea.  Genitourinary: Negative for dysuria and frequency.  Musculoskeletal: Positive for joint pain. Negative for falls.  Skin:  Negative for rash.  Neurological: Negative for dizziness, loss of consciousness and headaches.  Endo/Heme/Allergies: Negative for environmental allergies.  Psychiatric/Behavioral: Negative for depression. The patient is not nervous/anxious.        Objective:    Physical Exam  Constitutional: He is oriented to person, place, and time. He appears well-developed and well-nourished. No distress.  HENT:  Head: Normocephalic and atraumatic.  Nose: Nose normal.  Eyes: Right eye exhibits no discharge. Left eye exhibits no discharge.  Neck: Normal range of motion. Neck supple.  Cardiovascular: Normal rate, regular rhythm and normal heart sounds.  No murmur heard. Pulmonary/Chest: Effort normal and breath sounds normal.  Abdominal: Soft. Bowel sounds are normal. There is no tenderness.  Musculoskeletal: He exhibits no edema.  Neurological: He is alert and oriented to person, place, and time.  Skin: Skin is warm and dry.  Psychiatric: He has a normal mood and affect.  Nursing note and vitals reviewed.   BP 130/90 (BP Location: Left Arm, Patient Position: Sitting, Cuff Size: Normal)   Pulse 72   Temp 98.5 F (36.9 C) (Oral)   Resp 18   Wt 245 lb 12.8 oz (111.5 kg)   SpO2 98%   BMI 37.37 kg/m  Wt Readings from Last 3 Encounters:  01/25/18 245 lb 12.8 oz (111.5 kg)  11/09/17 244 lb 6.4 oz (110.9 kg)  10/29/17 249 lb 12.8 oz (113.3 kg)     Lab Results  Component Value Date   WBC 4.9 11/09/2017  HGB 11.4 (L) 11/09/2017   HCT 35.3 (L) 11/09/2017   PLT 236.0 11/09/2017   GLUCOSE 218 (H) 11/09/2017   CHOL 144 11/09/2017   TRIG 157.0 (H) 11/09/2017   HDL 49.00 11/09/2017   LDLDIRECT 85.0 11/17/2016   LDLCALC 64 11/09/2017   ALT 19 11/09/2017   AST 24 11/09/2017   NA 141 11/09/2017   K 4.5 11/09/2017   CL 106 11/09/2017   CREATININE 1.05 11/09/2017   BUN 14 11/09/2017   CO2 26 11/09/2017   TSH 0.89 11/09/2017   PSA 1.55 06/25/2017   INR 1.13 03/29/2013   HGBA1C 6.6 (H)  11/09/2017   MICROALBUR 1.1 12/02/2015    Lab Results  Component Value Date   TSH 0.89 11/09/2017   Lab Results  Component Value Date   WBC 4.9 11/09/2017   HGB 11.4 (L) 11/09/2017   HCT 35.3 (L) 11/09/2017   MCV 81.5 11/09/2017   PLT 236.0 11/09/2017   Lab Results  Component Value Date   NA 141 11/09/2017   K 4.5 11/09/2017   CO2 26 11/09/2017   GLUCOSE 218 (H) 11/09/2017   BUN 14 11/09/2017   CREATININE 1.05 11/09/2017   BILITOT 0.5 11/09/2017   ALKPHOS 40 11/09/2017   AST 24 11/09/2017   ALT 19 11/09/2017   PROT 6.9 11/09/2017   ALBUMIN 4.3 11/09/2017   CALCIUM 9.0 11/09/2017   ANIONGAP 8 01/21/2016   GFR 76.04 11/09/2017   Lab Results  Component Value Date   CHOL 144 11/09/2017   Lab Results  Component Value Date   HDL 49.00 11/09/2017   Lab Results  Component Value Date   LDLCALC 64 11/09/2017   Lab Results  Component Value Date   TRIG 157.0 (H) 11/09/2017   Lab Results  Component Value Date   CHOLHDL 3 11/09/2017   Lab Results  Component Value Date   HGBA1C 6.6 (H) 11/09/2017       Assessment & Plan:   Problem List Items Addressed This Visit    Essential hypertension    Well controlled, no changes to meds. Encouraged heart healthy diet such as the DASH diet and exercise as tolerated.       Relevant Medications   metoprolol tartrate (LOPRESSOR) 25 MG tablet   Chronic pain of both knees    S/p right TKR but still struggling with pain as he recovers and when his pain spikes so does his blood pressure. He is given a more liberal sig on his tramadol temporarily.      Bilateral hip pain    Steroid shots have helped hip pain and stiffness.      Shoulder pain    Improved steroid injection.          I have discontinued Brent [name] "Don"'s metoprolol tartrate. I am also having him start on metoprolol tartrate. Additionally, I am having him maintain his multivitamin, Vitamin B-12, Vitamin D-3, Krill Oil, EPINEPHrine, mometasone,  glucose blood, metFORMIN, Magnesium, methocarbamol, gabapentin, LIVALO, Vitamin D (Ergocalciferol), traMADol, pantoprazole, and fenofibrate.  Meds ordered this encounter  Medications  . metoprolol tartrate (LOPRESSOR) 25 MG tablet    Sig: Take 1 tablet (25 mg total) by mouth 2 (two) times daily.    Dispense:  180 tablet    Refill:  1     [name], MD

## 2018-01-30 NOTE — Assessment & Plan Note (Addendum)
S/p right TKR but still struggling with pain as he recovers and when his pain spikes so does his blood pressure. He is given a more liberal sig on his tramadol temporarily.

## 2018-01-30 NOTE — Assessment & Plan Note (Signed)
Steroid shots have helped hip pain and stiffness.

## 2018-01-30 NOTE — Assessment & Plan Note (Signed)
Improved steroid injection.

## 2018-02-01 ENCOUNTER — Ambulatory Visit: Admitting: Family Medicine

## 2018-02-07 ENCOUNTER — Other Ambulatory Visit: Payer: Self-pay | Admitting: Family Medicine

## 2018-02-07 NOTE — Telephone Encounter (Signed)
Requesting:tramadol Contract:yes UDS:low risk next screen 12/27/17 Last OV:02/01/18   Next OV:03/01/18 Last Refill:01/04/18  #[id number] Database:   Please advise

## 2018-02-16 ENCOUNTER — Telehealth: Payer: Self-pay

## 2018-02-16 ENCOUNTER — Encounter: Payer: Self-pay | Admitting: Family Medicine

## 2018-02-16 ENCOUNTER — Telehealth: Payer: Self-pay | Admitting: *Deleted

## 2018-02-16 NOTE — Telephone Encounter (Signed)
Received Medical/Surgical Clearance Form from [facility]; forwarded to provider with last OV note/SLS 10/30

## 2018-02-16 NOTE — Telephone Encounter (Signed)
   [facility] Pre-operative Risk Assessment    Request for surgical clearance:  1. What type of surgery is being performed? Left Shoulder Complete rotator cuff tear/rupture; non trama  2. When is this surgery scheduled? TBD   3. What type of clearance is required (medical clearance vs. Pharmacy clearance to hold med vs. Both)? Medical  4. Are there any medications that need to be held prior to surgery and how long? No  5. Practice name and name of physician performing surgery? [facility]/Andrew Collins   6. What is your office phone number [phone]    7.   What is your office fax number [phone]  8.   Anesthesia type (None, local, MAC, general) ? General with ISB   Mady Haagensen 02/16/2018, 1:33 PM  _________________________________________________________________   (provider comments below)

## 2018-02-17 ENCOUNTER — Other Ambulatory Visit: Payer: Self-pay | Admitting: Family Medicine

## 2018-02-17 MED ORDER — HYDROCODONE-ACETAMINOPHEN 5-325 MG PO TABS: 1.0000 | ORAL_TABLET | Freq: Four times a day (QID) | ORAL | 0 refills | Status: DC | PRN

## 2018-02-17 NOTE — Telephone Encounter (Signed)
Patient returned call. Given past medical history and time since last visit, based on ACC/AHA guidelines, Brent [name] would be at acceptable risk for the planned procedure without further cardiovascular testing. He walks 2-3 miles/day without any symptoms.    I will route this recommendation to the requesting party via Epic fax function and remove from pre-op pool.  Please call with questions.  [place], [place] 02/17/2018, 3:43 PM

## 2018-02-17 NOTE — Telephone Encounter (Signed)
   Primary Cardiologist: Candee Furbish, MD  Chart reviewed as part of pre-operative protocol coverage. Left voice mail to call back between pre-op hours.   [place], [place] 02/17/2018, 2:46 PM

## 2018-03-01 ENCOUNTER — Encounter: Payer: Self-pay | Admitting: Family Medicine

## 2018-03-01 ENCOUNTER — Ambulatory Visit (INDEPENDENT_AMBULATORY_CARE_PROVIDER_SITE_OTHER): Admitting: Family Medicine

## 2018-03-01 DIAGNOSIS — M25551 Pain in right hip: Secondary | ICD-10-CM

## 2018-03-01 DIAGNOSIS — E119 Type 2 diabetes mellitus without complications: Secondary | ICD-10-CM

## 2018-03-01 DIAGNOSIS — M25552 Pain in left hip: Secondary | ICD-10-CM

## 2018-03-01 DIAGNOSIS — I10 Essential (primary) hypertension: Secondary | ICD-10-CM | POA: Diagnosis not present

## 2018-03-05 NOTE — Assessment & Plan Note (Signed)
Has daily pain and is awaiting surgery on left hip next month. May continue to alter Hydrocodone and Tramadol.

## 2018-03-05 NOTE — Assessment & Plan Note (Signed)
Well controlled, no changes to meds. Encouraged heart healthy diet such as the DASH diet and exercise as tolerated.  °

## 2018-03-05 NOTE — Assessment & Plan Note (Signed)
hgba1c acceptable, minimize simple carbs. Increase exercise as tolerated. Continue current meds 

## 2018-03-05 NOTE — Progress Notes (Signed)
Subjective:    Patient ID: Brent [name], male    DOB: 01-22-1956, 62 y.o.   MRN: [id number]  Chief Complaint  Patient presents with  . Hypertension    Here for follow up    HPI Patient is in today for follow up. He is struggling with ongoing pain. His hips bother him daily and he is following with orthopaedics. He is awaiting surgery next month due to the severity of his pain and debility. No recent fall or injury, no incontinence. Also notes shoulder pain. No polyuria or polydipsia. Denies CP/palp/SOB/HA/congestion/fevers/GI or GU c/o. Taking meds as prescribed  Past Medical History:  Diagnosis Date  . Acid reflux disease   . ACID REFLUX DISEASE 07/05/2007  . Anemia   . Atherosclerosis   . Benign neoplasm of colon 07/05/2007  . Bilateral hip pain 10/05/2016  . Breast pain, left 11/17/2011  . Chest pain    a. Reportedly negative dobut echo performed prior to gastric bypass in 03/2011;  b. 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 12/2011 Cath: LM nl, LAD 50p, D1 10m, LCX min irregs, OM3 30, RCA 25p, 61m (FFR 0.99->0.89), PDA 30, EF 65%, Med Rx.  . COLONIC POLYPS, HX OF 10/29/2008  . Diarrhea 06/13/2010  . Diverticulosis of colon   . DIVERTICULOSIS, COLON 10/29/2008  . DM (diabetes mellitus), type 2, uncontrolled ([place])   . GI bleed   . Gout 03/04/2013  . Hearing loss 05/24/2013   Previous audiology evaluation completely.  [name] HTN (hypertension) 07/14/2010  . Hx of colonic polyps   . HYPERSOMNIA, ASSOCIATED WITH SLEEP APNEA 07/26/2008  . Hypertension 07/14/2010  . 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 Knee pain, left 10/10/2010  . Morbid obesity ([place]) 03/27/2010   a. 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  [name] Neck pain 03/2015  . Other and unspecified hyperlipidemia 11/16/2012  . Preventative health care 11/17/2011  . Renal stone 11/2013  . Sleep apnea    a. CPAP  . Spinal stenosis   . Tear of meniscus of left knee 2012    Past Surgical History:  Procedure Laterality Date  . ANKLE SURGERY  1994    . CARDIAC CATHETERIZATION     denies any chest pain in the past 2 years  . COLONOSCOPY    . colonoscopy polyps    . ESOPHAGOGASTRODUODENOSCOPY N/A 03/27/2013   Procedure: ESOPHAGOGASTRODUODENOSCOPY (EGD);  Surgeon: Irene Shipper, MD;  Location: [facility];  Service: Endoscopy;  Laterality: N/A;  . GASTRIC BYPASS    . HERNIA REPAIR    . KNEE ARTHROSCOPY  11/06/10   Left, torn meniscus (repaired)  . LEFT HEART CATHETERIZATION WITH CORONARY ANGIOGRAM N/A 12/23/2011   Procedure: LEFT HEART CATHETERIZATION WITH CORONARY ANGIOGRAM;  Surgeon: Minus Breeding, MD;  Location: [facility] CATH LAB;  Service: Cardiovascular;  Laterality: N/A;  . right knee arthroscopy Right 07/05/14   Dr. Hart Robinsons, [place].  . TONSILLECTOMY  age 9  . TOTAL KNEE ARTHROPLASTY Right 01/20/2016   Procedure: RIGHT TOTAL KNEE ARTHROPLASTY;  Surgeon: Paralee Cancel, MD;  Location: [facility];  Service: Orthopedics;  Laterality: Right;  . UPPER GI ENDOSCOPY  03/27/13    Family History  Problem Relation Age of Onset  . Diabetes Mother   . Hypertension Mother   . Stroke Mother   . Hyperlipidemia Mother   . Hypertension Father   . Colon polyps Father   . Heart attack Father 69  . Stroke Father   . Heart attack Brother   .  Diabetes Brother   . Heart disease Brother   . Heart attack Brother        Multiple  . Diabetes Brother   . Other Brother        heart problems  . Heart disease Brother   . Diabetes Sister   . Obesity Brother   . Diabetes Brother   . Heart disease Brother   . Hypertension Maternal Grandmother   . ADD / ADHD Daughter   . Heart disease Brother   . Stomach cancer Neg Hx   . Colon cancer Neg Hx   . Esophageal cancer Neg Hx   . Rectal cancer Neg Hx     Social History   Socioeconomic History  . Marital status: Married    Spouse name: Not on file  . Number of children: 2  . Years of education: Not on file  . Highest education level: Not on file  Occupational History  . Occupation: Associate Professor: [employer]  Social Needs  . Financial resource strain: Not on file  . Food insecurity:    Worry: Not on file    Inability: Not on file  . Transportation needs:    Medical: Not on file    Non-medical: Not on file  Tobacco Use  . Smoking status: Former Smoker    Packs/day: 1.50    Years: 20.00    Pack years: 30.00    Types: Cigarettes    Last attempt to quit: 04/21/1991    Years since quitting: 26.8  . Smokeless tobacco: Never Used  Substance and Sexual Activity  . Alcohol use: Yes    Alcohol/week: 3.0 standard drinks    Types: 3 Cans of beer per week    Comment: social  . Drug use: No  . Sexual activity: Not on file  Lifestyle  . Physical activity:    Days per week: Not on file    Minutes per session: Not on file  . Stress: Not on file  Relationships  . Social connections:    Talks on phone: Not on file    Gets together: Not on file    Attends religious service: Not on file    Active member of club or organization: Not on file    Attends meetings of clubs or organizations: Not on file    Relationship status: Not on file  . Intimate partner violence:    Fear of current or ex partner: Not on file    Emotionally abused: Not on file    Physically abused: Not on file    Forced sexual activity: Not on file  Other Topics Concern  . Not on file  Social History Narrative   Lives with wife in [place].  Does not routinely exercise.    Outpatient Medications Prior to Visit  Medication Sig Dispense Refill  . Cholecalciferol (VITAMIN D-3) 5000 UNITS TABS Take 1 tablet by mouth every morning.     . Cyanocobalamin (VITAMIN B-12) 1000 MCG SUBL Place 1 each under the tongue every morning.     [name] EPINEPHrine (EPIPEN 2-PAK) 0.3 mg/0.3 mL IJ SOAJ injection Inject 0.3 mLs (0.3 mg total) into the muscle once. 3 Device 1  . fenofibrate 160 MG tablet Take 1 tablet (160 mg total) by mouth daily. 90 tablet 1  . gabapentin (NEURONTIN) 300 MG capsule Take 1 capsule (300  mg total) by mouth 3 (three) times daily. 90 capsule 3  . glucose blood test strip Use as directed  once daily to check blood sugar.  Diagnosis code E11.9 100 each 12  . HYDROcodone-acetaminophen (NORCO/VICODIN) 5-325 MG tablet Take 1 tablet by mouth every 6 (six) hours as needed for moderate pain or severe pain. 60 tablet 0  . Krill Oil 1000 MG CAPS Take 1 tablet by mouth 2 (two) times daily.     [name] LIVALO 1 MG TABS TAKE 1 TABLET DAILY 90 tablet 1  . Magnesium 100 MG CAPS Take 1 capsule (100 mg total) by mouth daily. 30 capsule 1  . metFORMIN (GLUCOPHAGE) 500 MG tablet TAKE 1 TABLET FOUR TIMES A DAY 360 tablet 1  . methocarbamol (ROBAXIN) 500 MG tablet TAKE ONE TABLET BY MOUTH EVERY 6 HOURS AS NEEDED FOR MUSCLE SPASMS 120 tablet 5  . metoprolol tartrate (LOPRESSOR) 25 MG tablet Take 1 tablet (25 mg total) by mouth 2 (two) times daily. 180 tablet 1  . mometasone (ELOCON) 0.1 % cream Apply 1 application topically daily. 50 g 1  . Multiple Vitamin (MULTIVITAMIN) tablet Take 1 tablet by mouth 2 (two) times daily. BARIATRIC VIT    . pantoprazole (PROTONIX) 40 MG tablet Take 1 tablet (40 mg total) by mouth 2 (two) times daily before a meal. 180 tablet 3  . traMADol (ULTRAM) 50 MG tablet TAKE 1 TABLET(50 MG) BY MOUTH EVERY 6 HOURS AS NEEDED FOR MODERATE PAIN OR SEVERE PAIN 120 tablet 0  . Vitamin D, Ergocalciferol, (DRISDOL) 50000 units CAPS capsule Take 1 capsule (50,000 Units total) by mouth every 7 (seven) days. 4 capsule 4   No facility-administered medications prior to visit.     Allergies  Allergen Reactions  . Bee Venom Anaphylaxis  . Lipitor [Atorvastatin]     Myalgias, memory changes  . Morphine     hyperactive  . Hydrocodone Itching  . Pravastatin Other (See Comments)    Made joints hurt. --also simvastatin     Review of Systems  Constitutional: Negative for fever and malaise/fatigue.  HENT: Negative for congestion.   Eyes: Negative for blurred vision.  Respiratory: Negative for  shortness of breath.   Cardiovascular: Negative for chest pain, palpitations and leg swelling.  Gastrointestinal: Negative for abdominal pain, blood in stool and nausea.  Genitourinary: Negative for dysuria and frequency.  Musculoskeletal: Positive for back pain and joint pain. Negative for falls.  Skin: Negative for rash.  Neurological: Negative for dizziness, loss of consciousness and headaches.  Endo/Heme/Allergies: Negative for environmental allergies.  Psychiatric/Behavioral: Negative for depression. The patient is not nervous/anxious.        Objective:    Physical Exam  Constitutional: He is oriented to person, place, and time. He appears well-developed and well-nourished. No distress.  HENT:  Head: Normocephalic and atraumatic.  Nose: Nose normal.  Eyes: Right eye exhibits no discharge. Left eye exhibits no discharge.  Neck: Normal range of motion. Neck supple.  Cardiovascular: Normal rate and regular rhythm.  Pulmonary/Chest: Effort normal and breath sounds normal.  Abdominal: Soft. Bowel sounds are normal. There is no tenderness.  Musculoskeletal: He exhibits no edema.  Neurological: He is alert and oriented to person, place, and time.  Skin: Skin is warm and dry.  Psychiatric: He has a normal mood and affect.  Nursing note and vitals reviewed.   BP (!) 130/92 (BP Location: Left Arm, Patient Position: Sitting, Cuff Size: Small)   Pulse 75   Temp 98.5 F (36.9 C) (Oral)   Resp 16   Ht 5\' 8"  (1.727 m)   Wt 246 lb (111.6 kg)  SpO2 97%   BMI 37.40 kg/m  Wt Readings from Last 3 Encounters:  03/01/18 246 lb (111.6 kg)  01/25/18 245 lb 12.8 oz (111.5 kg)  11/09/17 244 lb 6.4 oz (110.9 kg)     Lab Results  Component Value Date   WBC 4.9 11/09/2017   HGB 11.4 (L) 11/09/2017   HCT 35.3 (L) 11/09/2017   PLT 236.0 11/09/2017   GLUCOSE 218 (H) 11/09/2017   CHOL 144 11/09/2017   TRIG 157.0 (H) 11/09/2017   HDL 49.00 11/09/2017   LDLDIRECT 85.0 11/17/2016    LDLCALC 64 11/09/2017   ALT 19 11/09/2017   AST 24 11/09/2017   NA 141 11/09/2017   K 4.5 11/09/2017   CL 106 11/09/2017   CREATININE 1.05 11/09/2017   BUN 14 11/09/2017   CO2 26 11/09/2017   TSH 0.89 11/09/2017   PSA 1.55 06/25/2017   INR 1.13 03/29/2013   HGBA1C 6.6 (H) 11/09/2017   MICROALBUR 1.1 12/02/2015    Lab Results  Component Value Date   TSH 0.89 11/09/2017   Lab Results  Component Value Date   WBC 4.9 11/09/2017   HGB 11.4 (L) 11/09/2017   HCT 35.3 (L) 11/09/2017   MCV 81.5 11/09/2017   PLT 236.0 11/09/2017   Lab Results  Component Value Date   NA 141 11/09/2017   K 4.5 11/09/2017   CO2 26 11/09/2017   GLUCOSE 218 (H) 11/09/2017   BUN 14 11/09/2017   CREATININE 1.05 11/09/2017   BILITOT 0.5 11/09/2017   ALKPHOS 40 11/09/2017   AST 24 11/09/2017   ALT 19 11/09/2017   PROT 6.9 11/09/2017   ALBUMIN 4.3 11/09/2017   CALCIUM 9.0 11/09/2017   ANIONGAP 8 01/21/2016   GFR 76.04 11/09/2017   Lab Results  Component Value Date   CHOL 144 11/09/2017   Lab Results  Component Value Date   HDL 49.00 11/09/2017   Lab Results  Component Value Date   LDLCALC 64 11/09/2017   Lab Results  Component Value Date   TRIG 157.0 (H) 11/09/2017   Lab Results  Component Value Date   CHOLHDL 3 11/09/2017   Lab Results  Component Value Date   HGBA1C 6.6 (H) 11/09/2017       Assessment & Plan:   Problem List Items Addressed This Visit    T2DM (type 2 diabetes mellitus) ([place])    hgba1c acceptable, minimize simple carbs. Increase exercise as tolerated. Continue current meds      Essential hypertension    Well controlled, no changes to meds. Encouraged heart healthy diet such as the DASH diet and exercise as tolerated.       Bilateral hip pain    Has daily pain and is awaiting surgery on left hip next month. May continue to alter Hydrocodone and Tramadol.          I am having Brent [name] "Don" maintain his multivitamin, Vitamin B-12, Vitamin  D-3, Krill Oil, EPINEPHrine, mometasone, glucose blood, metFORMIN, Magnesium, methocarbamol, gabapentin, LIVALO, Vitamin D (Ergocalciferol), pantoprazole, fenofibrate, metoprolol tartrate, traMADol, and HYDROcodone-acetaminophen.  No orders of the defined types were placed in this encounter.    [name], MD

## 2018-03-08 ENCOUNTER — Encounter: Payer: Self-pay | Admitting: Family Medicine

## 2018-03-09 MED ORDER — EPINEPHRINE 0.3 MG/0.3ML IJ SOAJ: 0.3000 mg | Freq: Once | INTRAMUSCULAR | 1 refills | Status: AC

## 2018-03-11 ENCOUNTER — Other Ambulatory Visit: Payer: Self-pay | Admitting: Family Medicine

## 2018-03-13 MED ORDER — HYDROCODONE-ACETAMINOPHEN 5-325 MG PO TABS: 1.0000 | ORAL_TABLET | Freq: Four times a day (QID) | ORAL | 0 refills | Status: DC | PRN

## 2018-03-13 MED ORDER — TRAMADOL HCL 50 MG PO TABS: 50.0000 mg | ORAL_TABLET | Freq: Three times a day (TID) | ORAL | 0 refills | Status: DC | PRN

## 2018-03-24 ENCOUNTER — Encounter: Payer: Self-pay | Admitting: Family Medicine

## 2018-03-24 ENCOUNTER — Other Ambulatory Visit: Payer: Self-pay | Admitting: Family Medicine

## 2018-03-24 MED ORDER — HYDROCODONE-ACETAMINOPHEN 5-325 MG PO TABS: 1.0000 | ORAL_TABLET | Freq: Four times a day (QID) | ORAL | 0 refills | Status: DC | PRN

## 2018-03-24 NOTE — Telephone Encounter (Signed)
Requesting:Norco  Contract:yes UDS:low risk next screen 12/27/17 Last OV:03/01/18 Next OV:06/07/18 Last Refill:03/13/18  #[id number] Database:   Please advise

## 2018-04-05 ENCOUNTER — Other Ambulatory Visit: Payer: Self-pay | Admitting: Family Medicine

## 2018-04-05 MED ORDER — VITAMIN D (ERGOCALCIFEROL) 1.25 MG (50000 UNIT) PO CAPS: 50000.0000 [IU] | ORAL_CAPSULE | ORAL | 0 refills | Status: DC

## 2018-04-07 ENCOUNTER — Encounter: Payer: Self-pay | Admitting: Family Medicine

## 2018-04-14 ENCOUNTER — Telehealth: Payer: Self-pay

## 2018-04-14 DIAGNOSIS — E559 Vitamin D deficiency, unspecified: Secondary | ICD-10-CM

## 2018-04-14 MED ORDER — VITAMIN D (ERGOCALCIFEROL) 1.25 MG (50000 UNIT) PO CAPS: 50000.0000 [IU] | ORAL_CAPSULE | ORAL | 0 refills | Status: DC

## 2018-04-14 NOTE — Telephone Encounter (Signed)
[facility] day supply of vitamin D. [facility]

## 2018-04-21 ENCOUNTER — Encounter: Payer: Self-pay | Admitting: Family Medicine

## 2018-04-22 ENCOUNTER — Other Ambulatory Visit: Payer: Self-pay | Admitting: Family Medicine

## 2018-04-22 MED ORDER — HYDROCODONE-ACETAMINOPHEN 5-325 MG PO TABS: 1.0000 | ORAL_TABLET | Freq: Four times a day (QID) | ORAL | 0 refills | Status: DC | PRN

## 2018-04-30 ENCOUNTER — Other Ambulatory Visit: Payer: Self-pay | Admitting: Family Medicine

## 2018-05-02 ENCOUNTER — Other Ambulatory Visit: Payer: Self-pay | Admitting: Family Medicine

## 2018-05-02 DIAGNOSIS — E559 Vitamin D deficiency, unspecified: Secondary | ICD-10-CM

## 2018-05-11 ENCOUNTER — Telehealth: Payer: Self-pay

## 2018-05-11 NOTE — Telephone Encounter (Signed)
Copied from [place] [phone]. Topic: General - Other >> May 11, 2018  1:26 PM Lennox Solders wrote: Reason for CRM: pt wife margaret would like dr blyth to return her call tomorrow it is personal

## 2018-05-11 NOTE — Telephone Encounter (Signed)
Please get her on the phone when we have a minute and I can talk to her. Confirm we have his signature to allow [place] to talk with her.

## 2018-05-12 NOTE — Telephone Encounter (Signed)
Called spouse left a message letting her know I will try again in about 30 minutes if no answer will try again tomorrow

## 2018-05-13 NOTE — Telephone Encounter (Signed)
Unable to call patient due to Dr. Charlett Blake not in the office for her to speak to her

## 2018-05-14 ENCOUNTER — Encounter: Payer: Self-pay | Admitting: Family Medicine

## 2018-05-16 NOTE — Telephone Encounter (Signed)
Attempted to reach pt by phone and left message to return my call regarding 05/14/18 mychart message and below response from PCP. Ok for [employer] /Triage to discuss with pt.

## 2018-05-16 NOTE — Telephone Encounter (Signed)
Pt states that he is weaning down to 1 a day of the hydrocodone as it is hurting his stomach. He states whatever the plan the dr has will be ok with him.

## 2018-05-16 NOTE — Telephone Encounter (Signed)
Brent Lukes, MD  to Me      1:24 PM  Find out how many hydrocodone he is currently taking . Cannot really have 5 Tramadol a day if he is taking numerous hydrocodone as well. He should be weaning one down if possible

## 2018-05-17 ENCOUNTER — Other Ambulatory Visit: Payer: Self-pay

## 2018-05-17 MED ORDER — TRAMADOL HCL 50 MG PO TABS: ORAL_TABLET | ORAL | 0 refills | Status: DC

## 2018-05-17 MED ORDER — HYDROCODONE-ACETAMINOPHEN 5-325 MG PO TABS: 1.0000 | ORAL_TABLET | Freq: Every day | ORAL | 0 refills | Status: DC

## 2018-05-19 ENCOUNTER — Encounter: Payer: Self-pay | Admitting: Family Medicine

## 2018-05-19 NOTE — Telephone Encounter (Signed)
Spoke with patient and let him know we will switch his Tramadol to 5 times a day and the Norco to 1 time a day

## 2018-05-20 ENCOUNTER — Encounter: Payer: Self-pay | Admitting: Family Medicine

## 2018-05-23 ENCOUNTER — Other Ambulatory Visit: Payer: Self-pay | Admitting: Family Medicine

## 2018-05-23 MED ORDER — TRAMADOL HCL 50 MG PO TABS: ORAL_TABLET | ORAL | 0 refills | Status: DC

## 2018-05-23 NOTE — Telephone Encounter (Signed)
Patient would like both pain medications sent to [facility].  They were sent to mail order.

## 2018-05-23 NOTE — Telephone Encounter (Signed)
Patient 's wife called upset that the medication refill was not available.  Advised that she called today at 3:43pm. And that medication refill can take up to 3 business days.  Patient's wife said that her husband has been out of medication for a week.  And said that it was the office fault because it was forwarded to the wrong pharmacy.Please advise

## 2018-05-23 NOTE — Telephone Encounter (Signed)
Copied from [place] [id number]. Topic: Quick Communication - Rx Refill/Question >> May 23, 2018  3:40 PM Alanda Slim E wrote: Medication: traMADol (ULTRAM) 50 MG tablet   Has the patient contacted their pharmacy? {yes- [facility] be [facility] to [facility] to the Pt for 7 days - needs to sent to other pharmacy for pick up   Preferred Pharmacy (with phone number or street name): [facility], [place] - [address] [place] [address] [place] [address] [place] [address] [phone] (Phone) [phone] (Fax)    Agent: Please be advised that RX refills may take up to 3 business days. We ask that you follow-up with your pharmacy.

## 2018-05-23 NOTE — Telephone Encounter (Signed)
Spoke with Dr Charlett Blake. Pt's spouse is on the phone and wants Rx sent today.  Per pt's email today he has received hydrocodone but the tramadol was still "in the mail". PCP sent Tramadol to [facility] in [place]. [facility] notified pt's spouse.

## 2018-05-24 NOTE — Telephone Encounter (Signed)
Medication was sent to pharmacy for 7 days.  Patient notified

## 2018-06-07 ENCOUNTER — Encounter: Payer: Self-pay | Admitting: Family Medicine

## 2018-06-07 ENCOUNTER — Ambulatory Visit: Admitting: Family Medicine

## 2018-06-07 ENCOUNTER — Ambulatory Visit (INDEPENDENT_AMBULATORY_CARE_PROVIDER_SITE_OTHER): Admitting: Family Medicine

## 2018-06-07 DIAGNOSIS — E119 Type 2 diabetes mellitus without complications: Secondary | ICD-10-CM

## 2018-06-07 DIAGNOSIS — I10 Essential (primary) hypertension: Secondary | ICD-10-CM

## 2018-06-07 DIAGNOSIS — E559 Vitamin D deficiency, unspecified: Secondary | ICD-10-CM | POA: Diagnosis not present

## 2018-06-07 DIAGNOSIS — E782 Mixed hyperlipidemia: Secondary | ICD-10-CM | POA: Diagnosis not present

## 2018-06-07 DIAGNOSIS — E538 Deficiency of other specified B group vitamins: Secondary | ICD-10-CM

## 2018-06-07 DIAGNOSIS — M1A9XX0 Chronic gout, unspecified, without tophus (tophi): Secondary | ICD-10-CM

## 2018-06-07 DIAGNOSIS — Z79899 Other long term (current) drug therapy: Secondary | ICD-10-CM | POA: Diagnosis not present

## 2018-06-07 DIAGNOSIS — G4700 Insomnia, unspecified: Secondary | ICD-10-CM

## 2018-06-07 DIAGNOSIS — M25519 Pain in unspecified shoulder: Secondary | ICD-10-CM

## 2018-06-07 LAB — TSH: TSH: 3.68 u[IU]/mL (ref 0.35–4.50)

## 2018-06-07 LAB — CBC
HCT: 34.3 % — ABNORMAL LOW (ref 39.0–52.0)
Hemoglobin: 10.8 g/dL — ABNORMAL LOW (ref 13.0–17.0)
MCHC: 31.5 g/dL (ref 30.0–36.0)
MCV: 80.9 fl (ref 78.0–100.0)
Platelets: 240 10*3/uL (ref 150.0–400.0)
RBC: 4.23 Mil/uL (ref 4.22–5.81)
RDW: 15.9 % — ABNORMAL HIGH (ref 11.5–15.5)
WBC: 5.1 10*3/uL (ref 4.0–10.5)

## 2018-06-07 LAB — COMPREHENSIVE METABOLIC PANEL
ALT: 13 U/L (ref 0–53)
AST: 21 U/L (ref 0–37)
Albumin: 4.3 g/dL (ref 3.5–5.2)
Alkaline Phosphatase: 38 U/L — ABNORMAL LOW (ref 39–117)
BUN: 17 mg/dL (ref 6–23)
CO2: 27 mEq/L (ref 19–32)
Calcium: 9.4 mg/dL (ref 8.4–10.5)
Chloride: 103 mEq/L (ref 96–112)
Creatinine, Ser: 1 mg/dL (ref 0.40–1.50)
GFR: 75.55 mL/min (ref >60.00)
Glucose, Bld: 158 mg/dL — ABNORMAL HIGH (ref 70–99)
Potassium: 4.9 mEq/L (ref 3.5–5.1)
Sodium: 139 mEq/L (ref 135–145)
Total Bilirubin: 0.6 mg/dL (ref 0.2–1.2)
Total Protein: 6.5 g/dL (ref 6.0–8.3)

## 2018-06-07 LAB — LIPID PANEL
Cholesterol: 127 mg/dL (ref 0–200)
HDL: 53.5 mg/dL (ref >39.00)
LDL Cholesterol: 51 mg/dL (ref 0–99)
NonHDL: 73.74
Total CHOL/HDL Ratio: 2
Triglycerides: 113 mg/dL (ref 0.0–149.0)
VLDL: 22.6 mg/dL (ref 0.0–40.0)

## 2018-06-07 LAB — VITAMIN D 25 HYDROXY (VIT D DEFICIENCY, FRACTURES): VITD: 38.31 ng/mL (ref 30.00–100.00)

## 2018-06-07 LAB — HEMOGLOBIN A1C: Hgb A1c MFr Bld: 6.9 % — ABNORMAL HIGH (ref 4.6–6.5)

## 2018-06-07 LAB — VITAMIN B12: Vitamin B-12: 936 pg/mL — ABNORMAL HIGH (ref 211–911)

## 2018-06-07 LAB — URIC ACID: Uric Acid, Serum: 6.1 mg/dL (ref 4.0–7.8)

## 2018-06-07 MED ORDER — GABAPENTIN 300 MG PO CAPS: 300.0000 mg | ORAL_CAPSULE | Freq: Every day | ORAL | 0 refills | Status: DC

## 2018-06-07 NOTE — Assessment & Plan Note (Signed)
Continues to keep him up at night. Was sleeping better when he was using Gabapentin for his neuropathy but since that had improved after his knee surgery he had stopped it. Will try adding gabapentin 300 mg qhs and see if that helps

## 2018-06-07 NOTE — Assessment & Plan Note (Signed)
No recent continue to monitor

## 2018-06-07 NOTE — Assessment & Plan Note (Signed)
Encouraged good sleep hygiene such as dark, quiet room. No blue/green glowing lights such as computer screens in bedroom. No alcohol or stimulants in evening. Cut down on caffeine as able. Regular exercise is helpful but not just prior to bed time. He is encouraged to increase his Melatonin from 2 to 4 mg and then higher up to 10 as needed. Also encouraged to stop drinking alcohol since that disrupts sleep and melatonin production.

## 2018-06-07 NOTE — Assessment & Plan Note (Signed)
Well controlled, no changes to meds. Encouraged heart healthy diet such as the DASH diet and exercise as tolerated.  °

## 2018-06-07 NOTE — Assessment & Plan Note (Signed)
hgba1c acceptable, minimize simple carbs. Increase exercise as tolerated. Continue current meds 

## 2018-06-07 NOTE — Progress Notes (Signed)
Subjective:    Patient ID: Brent [name], male    DOB: 02-01-1956, 63 y.o.   MRN: [id number]  No chief complaint on file.   HPI Patient is in today for follow up and is doing well except he continues to have shoulder pain since his surgery. This is keeping him up at night.  He has trouble falling asleep and staying asleep.  Every time he rolls over in his shoulder he is awake.  He is taking 2 mg of melatonin as well as some CBD sleep medication tramadol and Robaxin at bedtime.  No other recent febrile illness or acute hospitalizations.  No other acute concerns. Denies CP/palp/SOB/HA/congestion/fevers/GI or GU c/o. Taking meds as prescribed  Past Medical History:  Diagnosis Date  . Acid reflux disease   . ACID REFLUX DISEASE 07/05/2007  . Anemia   . Atherosclerosis   . Benign neoplasm of colon 07/05/2007  . Bilateral hip pain 10/05/2016  . Breast pain, left 11/17/2011  . Chest pain    a. Reportedly negative dobut echo performed prior to gastric bypass in 03/2011;  b. 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 12/2011 Cath: LM nl, LAD 50p, D1 18m, LCX min irregs, OM3 30, RCA 25p, 82m (FFR 0.99->0.89), PDA 30, EF 65%, Med Rx.  . COLONIC POLYPS, HX OF 10/29/2008  . Diarrhea 06/13/2010  . Diverticulosis of colon   . DIVERTICULOSIS, COLON 10/29/2008  . DM (diabetes mellitus), type 2, uncontrolled ([place])   . GI bleed   . Gout 03/04/2013  . Hearing loss 05/24/2013   Previous audiology evaluation completely.  [name] HTN (hypertension) 07/14/2010  . Hx of colonic polyps   . HYPERSOMNIA, ASSOCIATED WITH SLEEP APNEA 07/26/2008  . Hypertension 07/14/2010  . 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 Knee pain, left 10/10/2010  . Morbid obesity ([place]) 03/27/2010   a. 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  [name] Neck pain 03/2015  . Other and unspecified hyperlipidemia 11/16/2012  . Preventative health care 11/17/2011  . Renal stone 11/2013  . Sleep apnea    a. CPAP  . Spinal stenosis   . Tear of meniscus of left knee 2012     Past Surgical History:  Procedure Laterality Date  . ANKLE SURGERY  1994  . CARDIAC CATHETERIZATION     denies any chest pain in the past 2 years  . COLONOSCOPY    . colonoscopy polyps    . ESOPHAGOGASTRODUODENOSCOPY N/A 03/27/2013   Procedure: ESOPHAGOGASTRODUODENOSCOPY (EGD);  Surgeon: Irene Shipper, MD;  Location: [facility];  Service: Endoscopy;  Laterality: N/A;  . GASTRIC BYPASS    . HERNIA REPAIR    . KNEE ARTHROSCOPY  11/06/10   Left, torn meniscus (repaired)  . LEFT HEART CATHETERIZATION WITH CORONARY ANGIOGRAM N/A 12/23/2011   Procedure: LEFT HEART CATHETERIZATION WITH CORONARY ANGIOGRAM;  Surgeon: Minus Breeding, MD;  Location: [facility] CATH LAB;  Service: Cardiovascular;  Laterality: N/A;  . right knee arthroscopy Right 07/05/14   Dr. Hart Robinsons, [place].  . TONSILLECTOMY  age 23  . TOTAL KNEE ARTHROPLASTY Right 01/20/2016   Procedure: RIGHT TOTAL KNEE ARTHROPLASTY;  Surgeon: Paralee Cancel, MD;  Location: [facility];  Service: Orthopedics;  Laterality: Right;  . UPPER GI ENDOSCOPY  03/27/13    Family History  Problem Relation Age of Onset  . Diabetes Mother   . Hypertension Mother   . Stroke Mother   . Hyperlipidemia Mother   . Hypertension Father   . Colon polyps Father   .  Heart attack Father 39  . Stroke Father   . Heart attack Brother   . Diabetes Brother   . Heart disease Brother   . Heart attack Brother        Multiple  . Diabetes Brother   . Other Brother        heart problems  . Heart disease Brother   . Diabetes Sister   . Obesity Brother   . Diabetes Brother   . Heart disease Brother   . Hypertension Maternal Grandmother   . ADD / ADHD Daughter   . Heart disease Brother   . Stomach cancer Neg Hx   . Colon cancer Neg Hx   . Esophageal cancer Neg Hx   . Rectal cancer Neg Hx     Social History   Socioeconomic History  . Marital status: Married    Spouse name: Not on file  . Number of children: 2  . Years of education: Not on file  . Highest  education level: Not on file  Occupational History  . Occupation: Product manager: [employer]  Social Needs  . Financial resource strain: Not on file  . Food insecurity:    Worry: Not on file    Inability: Not on file  . Transportation needs:    Medical: Not on file    Non-medical: Not on file  Tobacco Use  . Smoking status: Former Smoker    Packs/day: 1.50    Years: 20.00    Pack years: 30.00    Types: Cigarettes    Last attempt to quit: 04/21/1991    Years since quitting: 27.1  . Smokeless tobacco: Never Used  Substance and Sexual Activity  . Alcohol use: Yes    Alcohol/week: 3.0 standard drinks    Types: 3 Cans of beer per week    Comment: social  . Drug use: No  . Sexual activity: Not on file  Lifestyle  . Physical activity:    Days per week: Not on file    Minutes per session: Not on file  . Stress: Not on file  Relationships  . Social connections:    Talks on phone: Not on file    Gets together: Not on file    Attends religious service: Not on file    Active member of club or organization: Not on file    Attends meetings of clubs or organizations: Not on file    Relationship status: Not on file  . Intimate partner violence:    Fear of current or ex partner: Not on file    Emotionally abused: Not on file    Physically abused: Not on file    Forced sexual activity: Not on file  Other Topics Concern  . Not on file  Social History Narrative   Lives with wife in [place].  Does not routinely exercise.    Outpatient Medications Prior to Visit  Medication Sig Dispense Refill  . Cholecalciferol (VITAMIN D-3) 5000 UNITS TABS Take 1 tablet by mouth every morning.     . Cyanocobalamin (VITAMIN B-12) 1000 MCG SUBL Place 1 each under the tongue every morning.     . fenofibrate 160 MG tablet Take 1 tablet (160 mg total) by mouth daily. 90 tablet 1  . glucose blood test strip Use as directed once daily to check blood sugar.  Diagnosis code E11.9 100 each  12  . HYDROcodone-acetaminophen (NORCO) 5-325 MG tablet Take 1 tablet by mouth daily. 30 tablet 0  .  Krill Oil 1000 MG CAPS Take 1 tablet by mouth 2 (two) times daily.     [name] LIVALO 1 MG TABS TAKE 1 TABLET DAILY 90 tablet 4  . Magnesium 100 MG CAPS Take 1 capsule (100 mg total) by mouth daily. 30 capsule 1  . metFORMIN (GLUCOPHAGE) 500 MG tablet TAKE 1 TABLET FOUR TIMES A DAY 360 tablet 1  . methocarbamol (ROBAXIN) 500 MG tablet TAKE ONE TABLET BY MOUTH EVERY 6 HOURS AS NEEDED FOR MUSCLE SPASMS 120 tablet 5  . metoprolol tartrate (LOPRESSOR) 25 MG tablet Take 1 tablet (25 mg total) by mouth 2 (two) times daily. 180 tablet 1  . mometasone (ELOCON) 0.1 % cream Apply 1 application topically daily. 50 g 1  . Multiple Vitamin (MULTIVITAMIN) tablet Take 1 tablet by mouth 2 (two) times daily. BARIATRIC VIT    . pantoprazole (PROTONIX) 40 MG tablet Take 1 tablet (40 mg total) by mouth 2 (two) times daily before a meal. 180 tablet 3  . traMADol (ULTRAM) 50 MG tablet Take 1 tablet po tid and 2 tablet po qhs 35 tablet 0  . Vitamin D, Ergocalciferol, (DRISDOL) 1.25 MG (50000 UT) CAPS capsule TAKE 1 CAPSULE BY MOUTH EVERY 7 DAYS 12 capsule 3  . gabapentin (NEURONTIN) 300 MG capsule Take 1 capsule (300 mg total) by mouth 3 (three) times daily. 90 capsule 3   No facility-administered medications prior to visit.     Allergies  Allergen Reactions  . Bee Venom Anaphylaxis  . Lipitor [Atorvastatin]     Myalgias, memory changes  . Morphine     hyperactive  . Hydrocodone Itching  . Pravastatin Other (See Comments)    Made joints hurt. --also simvastatin     ROS     Objective:    Physical Exam  BP 118/68 (BP Location: Left Arm, Patient Position: Sitting, Cuff Size: Normal)   Pulse 89   Temp 98.2 F (36.8 C) (Oral)   Resp 18   Wt 251 lb 9.6 oz (114.1 kg)   SpO2 98%   BMI 38.26 kg/m  Wt Readings from Last 3 Encounters:  06/07/18 251 lb 9.6 oz (114.1 kg)  03/01/18 246 lb (111.6 kg)  01/25/18  245 lb 12.8 oz (111.5 kg)     Lab Results  Component Value Date   WBC 4.9 11/09/2017   HGB 11.4 (L) 11/09/2017   HCT 35.3 (L) 11/09/2017   PLT 236.0 11/09/2017   GLUCOSE 218 (H) 11/09/2017   CHOL 144 11/09/2017   TRIG 157.0 (H) 11/09/2017   HDL 49.00 11/09/2017   LDLDIRECT 85.0 11/17/2016   LDLCALC 64 11/09/2017   ALT 19 11/09/2017   AST 24 11/09/2017   NA 141 11/09/2017   K 4.5 11/09/2017   CL 106 11/09/2017   CREATININE 1.05 11/09/2017   BUN 14 11/09/2017   CO2 26 11/09/2017   TSH 0.89 11/09/2017   PSA 1.55 06/25/2017   INR 1.13 03/29/2013   HGBA1C 6.6 (H) 11/09/2017   MICROALBUR 1.1 12/02/2015    Lab Results  Component Value Date   TSH 0.89 11/09/2017   Lab Results  Component Value Date   WBC 4.9 11/09/2017   HGB 11.4 (L) 11/09/2017   HCT 35.3 (L) 11/09/2017   MCV 81.5 11/09/2017   PLT 236.0 11/09/2017   Lab Results  Component Value Date   NA 141 11/09/2017   K 4.5 11/09/2017   CO2 26 11/09/2017   GLUCOSE 218 (H) 11/09/2017   BUN 14 11/09/2017  CREATININE 1.05 11/09/2017   BILITOT 0.5 11/09/2017   ALKPHOS 40 11/09/2017   AST 24 11/09/2017   ALT 19 11/09/2017   PROT 6.9 11/09/2017   ALBUMIN 4.3 11/09/2017   CALCIUM 9.0 11/09/2017   ANIONGAP 8 01/21/2016   GFR 76.04 11/09/2017   Lab Results  Component Value Date   CHOL 144 11/09/2017   Lab Results  Component Value Date   HDL 49.00 11/09/2017   Lab Results  Component Value Date   LDLCALC 64 11/09/2017   Lab Results  Component Value Date   TRIG 157.0 (H) 11/09/2017   Lab Results  Component Value Date   CHOLHDL 3 11/09/2017   Lab Results  Component Value Date   HGBA1C 6.6 (H) 11/09/2017       Assessment & Plan:   Problem List Items Addressed This Visit    T2DM (type 2 diabetes mellitus) ([place])    hgba1c acceptable, minimize simple carbs. Increase exercise as tolerated. Continue current meds      Relevant Orders   Hemoglobin A1c   Essential hypertension    Well controlled,  no changes to meds. Encouraged heart healthy diet such as the DASH diet and exercise as tolerated.       Relevant Orders   CBC   TSH   Comprehensive metabolic panel   Hyperlipidemia, mixed    Tolerating statin, encouraged heart healthy diet, avoid trans fats, minimize simple carbs and saturated fats. Increase exercise as tolerated      Relevant Orders   Lipid panel   Gout    No recent continue to monitor      Relevant Orders   Uric acid   Vitamin D deficiency    Supplement and monitor      Relevant Orders   VITAMIN D 25 Hydroxy (Vit-D Deficiency, Fractures)   Vitamin B12 deficiency    Supplement and monitor      Relevant Orders   Vitamin B12   Shoulder pain    Continues to keep him up at night. Was sleeping better when he was using Gabapentin for his neuropathy but since that had improved after his knee surgery he had stopped it. Will try adding gabapentin 300 mg qhs and see if that helps      Insomnia    Encouraged good sleep hygiene such as dark, quiet room. No blue/green glowing lights such as computer screens in bedroom. No alcohol or stimulants in evening. Cut down on caffeine as able. Regular exercise is helpful but not just prior to bed time. He is encouraged to increase his Melatonin from 2 to 4 mg and then higher up to 10 as needed. Also encouraged to stop drinking alcohol since that disrupts sleep and melatonin production.        Other Visit Diagnoses    High risk medication use       Relevant Orders   Pain Mgmt, Profile 8 w/Conf, U      I have changed Brent [name] "Don"'s gabapentin. I am also having him maintain his multivitamin, Vitamin B-12, Vitamin D-3, Krill Oil, mometasone, glucose blood, metFORMIN, Magnesium, methocarbamol, pantoprazole, fenofibrate, metoprolol tartrate, LIVALO, Vitamin D (Ergocalciferol), HYDROcodone-acetaminophen, and traMADol.  Meds ordered this encounter  Medications  . gabapentin (NEURONTIN) 300 MG capsule    Sig: Take 1  capsule (300 mg total) by mouth at bedtime.    Dispense:  90 capsule    Refill:  0     [name], MD

## 2018-06-07 NOTE — Assessment & Plan Note (Signed)
Tolerating statin, encouraged heart healthy diet, avoid trans fats, minimize simple carbs and saturated fats. Increase exercise as tolerated 

## 2018-06-07 NOTE — Patient Instructions (Signed)
Carbohydrate Counting for Diabetes Mellitus, Adult  Carbohydrate counting is a method of keeping track of how many carbohydrates you eat. Eating carbohydrates naturally increases the amount of sugar (glucose) in the blood. Counting how many carbohydrates you eat helps keep your blood glucose within normal limits, which helps you manage your diabetes (diabetes mellitus). It is important to know how many carbohydrates you can safely have in each meal. This is different for every person. A diet and nutrition specialist (registered dietitian) can help you make a meal plan and calculate how many carbohydrates you should have at each meal and snack. Carbohydrates are found in the following foods:  Grains, such as breads and cereals.  Dried beans and soy products.  Starchy vegetables, such as potatoes, peas, and corn.  Fruit and fruit juices.  Milk and yogurt.  Sweets and snack foods, such as cake, cookies, candy, chips, and soft drinks. How do I count carbohydrates? There are two ways to count carbohydrates in food. You can use either of the methods or a combination of both. Reading "Nutrition Facts" on packaged food The "Nutrition Facts" list is included on the labels of almost all packaged foods and beverages in the [place] It includes:  The serving size.  Information about nutrients in each serving, including the grams (g) of carbohydrate per serving. To use the "Nutrition Facts":  Decide how many servings you will have.  Multiply the number of servings by the number of carbohydrates per serving.  The resulting number is the total amount of carbohydrates that you will be having. Learning standard serving sizes of other foods When you eat carbohydrate foods that are not packaged or do not include "Nutrition Facts" on the label, you need to measure the servings in order to count the amount of carbohydrates:  Measure the foods that you will eat with a food scale or measuring cup, if needed.   Decide how many standard-size servings you will eat.  Multiply the number of servings by 15. Most carbohydrate-rich foods have about 15 g of carbohydrates per serving. ? For example, if you eat 8 oz (170 g) of strawberries, you will have eaten 2 servings and 30 g of carbohydrates (2 servings x 15 g = 30 g).  For foods that have more than one food mixed, such as soups and casseroles, you must count the carbohydrates in each food that is included. The following list contains standard serving sizes of common carbohydrate-rich foods. Each of these servings has about 15 g of carbohydrates:   hamburger bun or  English muffin.   oz (15 mL) syrup.   oz (14 g) jelly.  1 slice of bread.  1 six-inch tortilla.  3 oz (85 g) cooked rice or pasta.  4 oz (113 g) cooked dried beans.  4 oz (113 g) starchy vegetable, such as peas, corn, or potatoes.  4 oz (113 g) hot cereal.  4 oz (113 g) mashed potatoes or  of a large baked potato.  4 oz (113 g) canned or frozen fruit.  4 oz (120 mL) fruit juice.  4-6 crackers.  6 chicken nuggets.  6 oz (170 g) unsweetened dry cereal.  6 oz (170 g) plain fat-free yogurt or yogurt sweetened with artificial sweeteners.  8 oz (240 mL) milk.  8 oz (170 g) fresh fruit or one small piece of fruit.  24 oz (680 g) popped popcorn. Example of carbohydrate counting Sample meal  3 oz (85 g) chicken breast.  6 oz (170 g)   brown rice.  4 oz (113 g) corn.  8 oz (240 mL) milk.  8 oz (170 g) strawberries with sugar-free whipped topping. Carbohydrate calculation 1. Identify the foods that contain carbohydrates: ? Rice. ? Corn. ? Milk. ? Strawberries. 2. Calculate how many servings you have of each food: ? 2 servings rice. ? 1 serving corn. ? 1 serving milk. ? 1 serving strawberries. 3. Multiply each number of servings by 15 g: ? 2 servings rice x 15 g = 30 g. ? 1 serving corn x 15 g = 15 g. ? 1 serving milk x 15 g = 15 g. ? 1 serving  strawberries x 15 g = 15 g. 4. Add together all of the amounts to find the total grams of carbohydrates eaten: ? 30 g + 15 g + 15 g + 15 g = 75 g of carbohydrates total. Summary  Carbohydrate counting is a method of keeping track of how many carbohydrates you eat.  Eating carbohydrates naturally increases the amount of sugar (glucose) in the blood.  Counting how many carbohydrates you eat helps keep your blood glucose within normal limits, which helps you manage your diabetes.  A diet and nutrition specialist (registered dietitian) can help you make a meal plan and calculate how many carbohydrates you should have at each meal and snack. This information is not intended to replace advice given to you by your health care provider. Make sure you discuss any questions you have with your health care provider. Document Released: 04/06/2005 Document Revised: 10/14/2016 Document Reviewed: 09/18/2015 Elsevier Interactive Patient Education  2019 Elsevier Inc.  

## 2018-06-07 NOTE — Assessment & Plan Note (Signed)
Supplement and monitor 

## 2018-06-08 ENCOUNTER — Encounter: Payer: Self-pay | Admitting: Family Medicine

## 2018-06-08 DIAGNOSIS — K625 Hemorrhage of anus and rectum: Secondary | ICD-10-CM

## 2018-06-10 LAB — PAIN MGMT, PROFILE 8 W/CONF, U
6 Acetylmorphine: NEGATIVE ng/mL (ref <10)
Alcohol Metabolites: POSITIVE ng/mL — AB (ref <500)
Amphetamines: NEGATIVE ng/mL (ref <500)
Benzodiazepines: NEGATIVE ng/mL (ref <100)
Buprenorphine, Urine: NEGATIVE ng/mL (ref <5)
Cocaine Metabolite: NEGATIVE ng/mL (ref <150)
Codeine: NEGATIVE ng/mL (ref <50)
Creatinine: 35.3 mg/dL
Ethyl Glucuronide (ETG): 3486 ng/mL — ABNORMAL HIGH (ref <500)
Ethyl Sulfate (ETS): 946 ng/mL — ABNORMAL HIGH (ref <100)
Hydrocodone: 117 ng/mL — ABNORMAL HIGH (ref <50)
Hydromorphone: 101 ng/mL — ABNORMAL HIGH (ref <50)
MDMA: NEGATIVE ng/mL (ref <500)
Marijuana Metabolite: NEGATIVE ng/mL (ref <20)
Morphine: NEGATIVE ng/mL (ref <50)
Norhydrocodone: 138 ng/mL — ABNORMAL HIGH (ref <50)
Opiates: POSITIVE ng/mL — AB (ref <100)
Oxidant: NEGATIVE ug/mL (ref <200)
Oxycodone: NEGATIVE ng/mL (ref <100)
pH: 6.65 (ref 4.5–9.0)

## 2018-06-13 ENCOUNTER — Encounter: Payer: Self-pay | Admitting: Family Medicine

## 2018-06-14 ENCOUNTER — Other Ambulatory Visit: Payer: Self-pay | Admitting: Family Medicine

## 2018-06-14 MED ORDER — FAMOTIDINE 40 MG PO TABS: 40.0000 mg | ORAL_TABLET | Freq: Every evening | ORAL | 1 refills | Status: DC | PRN

## 2018-06-15 ENCOUNTER — Other Ambulatory Visit: Payer: Self-pay | Admitting: Family Medicine

## 2018-06-15 DIAGNOSIS — E559 Vitamin D deficiency, unspecified: Secondary | ICD-10-CM

## 2018-06-16 ENCOUNTER — Encounter: Payer: Self-pay | Admitting: Family Medicine

## 2018-06-17 ENCOUNTER — Other Ambulatory Visit: Payer: Self-pay | Admitting: Family Medicine

## 2018-06-17 MED ORDER — TRAMADOL HCL 50 MG PO TABS: ORAL_TABLET | ORAL | 0 refills | Status: DC

## 2018-06-22 ENCOUNTER — Other Ambulatory Visit (INDEPENDENT_AMBULATORY_CARE_PROVIDER_SITE_OTHER)

## 2018-06-22 DIAGNOSIS — K625 Hemorrhage of anus and rectum: Secondary | ICD-10-CM

## 2018-06-22 LAB — FECAL OCCULT BLOOD, IMMUNOCHEMICAL: Fecal Occult Bld: NEGATIVE

## 2018-06-23 ENCOUNTER — Encounter: Payer: Self-pay | Admitting: Family Medicine

## 2018-06-24 ENCOUNTER — Other Ambulatory Visit: Payer: Self-pay | Admitting: Family Medicine

## 2018-06-24 DIAGNOSIS — D649 Anemia, unspecified: Secondary | ICD-10-CM

## 2018-06-28 ENCOUNTER — Other Ambulatory Visit: Payer: Self-pay | Admitting: Family Medicine

## 2018-06-29 ENCOUNTER — Telehealth: Payer: Self-pay | Admitting: Family

## 2018-06-29 NOTE — Telephone Encounter (Signed)
Spoke with patient to confirm new patient appt on  3/26 at 10 am

## 2018-07-05 ENCOUNTER — Telehealth: Payer: Self-pay | Admitting: Internal Medicine

## 2018-07-05 ENCOUNTER — Other Ambulatory Visit: Payer: Self-pay

## 2018-07-05 ENCOUNTER — Inpatient Hospital Stay (HOSPITAL_COMMUNITY)

## 2018-07-05 ENCOUNTER — Encounter (HOSPITAL_COMMUNITY): Payer: Self-pay

## 2018-07-05 ENCOUNTER — Inpatient Hospital Stay (HOSPITAL_COMMUNITY)
Admission: EM | Admit: 2018-07-05 | Discharge: 2018-07-08 | DRG: 378 | Disposition: A | Discharge status: Home / Self Care | Attending: Internal Medicine | Admitting: Internal Medicine

## 2018-07-05 DIAGNOSIS — K254 Chronic or unspecified gastric ulcer with hemorrhage: Secondary | ICD-10-CM | POA: Diagnosis present

## 2018-07-05 DIAGNOSIS — E119 Type 2 diabetes mellitus without complications: Secondary | ICD-10-CM | POA: Diagnosis present

## 2018-07-05 DIAGNOSIS — K283 Acute gastrojejunal ulcer without hemorrhage or perforation: Secondary | ICD-10-CM | POA: Diagnosis not present

## 2018-07-05 DIAGNOSIS — Z87891 Personal history of nicotine dependence: Secondary | ICD-10-CM | POA: Diagnosis not present

## 2018-07-05 DIAGNOSIS — K289 Gastrojejunal ulcer, unspecified as acute or chronic, without hemorrhage or perforation: Secondary | ICD-10-CM | POA: Diagnosis not present

## 2018-07-05 DIAGNOSIS — I10 Essential (primary) hypertension: Secondary | ICD-10-CM | POA: Diagnosis not present

## 2018-07-05 DIAGNOSIS — K648 Other hemorrhoids: Secondary | ICD-10-CM | POA: Diagnosis present

## 2018-07-05 DIAGNOSIS — D62 Acute posthemorrhagic anemia: Secondary | ICD-10-CM | POA: Diagnosis not present

## 2018-07-05 DIAGNOSIS — E1100 Type 2 diabetes mellitus with hyperosmolarity without nonketotic hyperglycemic-hyperosmolar coma (NKHHC): Secondary | ICD-10-CM

## 2018-07-05 DIAGNOSIS — K284 Chronic or unspecified gastrojejunal ulcer with hemorrhage: Secondary | ICD-10-CM | POA: Diagnosis present

## 2018-07-05 DIAGNOSIS — N200 Calculus of kidney: Secondary | ICD-10-CM | POA: Diagnosis present

## 2018-07-05 DIAGNOSIS — E782 Mixed hyperlipidemia: Secondary | ICD-10-CM | POA: Diagnosis not present

## 2018-07-05 DIAGNOSIS — K922 Gastrointestinal hemorrhage, unspecified: Secondary | ICD-10-CM | POA: Diagnosis not present

## 2018-07-05 DIAGNOSIS — G4730 Sleep apnea, unspecified: Secondary | ICD-10-CM | POA: Diagnosis present

## 2018-07-05 DIAGNOSIS — F5101 Primary insomnia: Secondary | ICD-10-CM | POA: Diagnosis not present

## 2018-07-05 DIAGNOSIS — Z7984 Long term (current) use of oral hypoglycemic drugs: Secondary | ICD-10-CM

## 2018-07-05 DIAGNOSIS — F1010 Alcohol abuse, uncomplicated: Secondary | ICD-10-CM | POA: Diagnosis present

## 2018-07-05 DIAGNOSIS — M25512 Pain in left shoulder: Secondary | ICD-10-CM | POA: Diagnosis present

## 2018-07-05 DIAGNOSIS — Z9884 Bariatric surgery status: Secondary | ICD-10-CM | POA: Diagnosis not present

## 2018-07-05 DIAGNOSIS — G4700 Insomnia, unspecified: Secondary | ICD-10-CM | POA: Diagnosis present

## 2018-07-05 DIAGNOSIS — Z833 Family history of diabetes mellitus: Secondary | ICD-10-CM

## 2018-07-05 DIAGNOSIS — G4733 Obstructive sleep apnea (adult) (pediatric): Secondary | ICD-10-CM | POA: Diagnosis not present

## 2018-07-05 DIAGNOSIS — Z860100 Personal history of colon polyps, unspecified: Secondary | ICD-10-CM

## 2018-07-05 DIAGNOSIS — Z96651 Presence of right artificial knee joint: Secondary | ICD-10-CM | POA: Diagnosis present

## 2018-07-05 DIAGNOSIS — K921 Melena: Secondary | ICD-10-CM | POA: Diagnosis not present

## 2018-07-05 DIAGNOSIS — Z823 Family history of stroke: Secondary | ICD-10-CM | POA: Diagnosis not present

## 2018-07-05 DIAGNOSIS — Z980 Intestinal bypass and anastomosis status: Secondary | ICD-10-CM | POA: Diagnosis not present

## 2018-07-05 DIAGNOSIS — Z6835 Body mass index (BMI) 35.0-35.9, adult: Secondary | ICD-10-CM

## 2018-07-05 DIAGNOSIS — I2510 Atherosclerotic heart disease of native coronary artery without angina pectoris: Secondary | ICD-10-CM | POA: Diagnosis present

## 2018-07-05 DIAGNOSIS — K5731 Diverticulosis of large intestine without perforation or abscess with bleeding: Secondary | ICD-10-CM | POA: Diagnosis present

## 2018-07-05 DIAGNOSIS — M1990 Unspecified osteoarthritis, unspecified site: Secondary | ICD-10-CM | POA: Diagnosis present

## 2018-07-05 DIAGNOSIS — Z8719 Personal history of other diseases of the digestive system: Secondary | ICD-10-CM

## 2018-07-05 DIAGNOSIS — G4710 Hypersomnia, unspecified: Secondary | ICD-10-CM | POA: Diagnosis present

## 2018-07-05 DIAGNOSIS — Z79899 Other long term (current) drug therapy: Secondary | ICD-10-CM

## 2018-07-05 DIAGNOSIS — Z9989 Dependence on other enabling machines and devices: Secondary | ICD-10-CM

## 2018-07-05 DIAGNOSIS — K219 Gastro-esophageal reflux disease without esophagitis: Secondary | ICD-10-CM | POA: Diagnosis not present

## 2018-07-05 DIAGNOSIS — Z934 Other artificial openings of gastrointestinal tract status: Secondary | ICD-10-CM

## 2018-07-05 DIAGNOSIS — K5790 Diverticulosis of intestine, part unspecified, without perforation or abscess without bleeding: Secondary | ICD-10-CM | POA: Diagnosis not present

## 2018-07-05 DIAGNOSIS — Z7982 Long term (current) use of aspirin: Secondary | ICD-10-CM

## 2018-07-05 HISTORY — DX: Diverticulosis of intestine, part unspecified, without perforation or abscess without bleeding: K57.90

## 2018-07-05 LAB — CBC
HCT: 24.6 % — ABNORMAL LOW (ref 39.0–52.0)
Hemoglobin: 7.2 g/dL — ABNORMAL LOW (ref 13.0–17.0)
MCH: 25.4 pg — ABNORMAL LOW (ref 26.0–34.0)
MCHC: 29.3 g/dL — ABNORMAL LOW (ref 30.0–36.0)
MCV: 86.9 fL (ref 80.0–100.0)
Platelets: 224 10*3/uL (ref 150–400)
RBC: 2.83 MIL/uL — ABNORMAL LOW (ref 4.22–5.81)
RDW: 15.9 % — ABNORMAL HIGH (ref 11.5–15.5)
WBC: 6 10*3/uL (ref 4.0–10.5)
nRBC: 0 % (ref 0.0–0.2)

## 2018-07-05 LAB — HEMOGLOBIN AND HEMATOCRIT, BLOOD
HCT: 20.5 % — ABNORMAL LOW (ref 39.0–52.0)
HCT: 20.5 % — ABNORMAL LOW (ref 39.0–52.0)
Hemoglobin: 6 g/dL — CL (ref 13.0–17.0)
Hemoglobin: 6.3 g/dL — CL (ref 13.0–17.0)

## 2018-07-05 LAB — COMPREHENSIVE METABOLIC PANEL
ALT: 16 U/L (ref 0–44)
AST: 21 U/L (ref 15–41)
Albumin: 3.6 g/dL (ref 3.5–5.0)
Alkaline Phosphatase: 28 U/L — ABNORMAL LOW (ref 38–126)
Anion gap: 7 (ref 5–15)
BUN: 24 mg/dL — ABNORMAL HIGH (ref 8–23)
CO2: 24 mmol/L (ref 22–32)
Calcium: 8.5 mg/dL — ABNORMAL LOW (ref 8.9–10.3)
Chloride: 107 mmol/L (ref 98–111)
Creatinine, Ser: 1.11 mg/dL (ref 0.61–1.24)
GFR calc Af Amer: >60 mL/min (ref >60)
GFR calc non Af Amer: >60 mL/min (ref >60)
Glucose, Bld: 165 mg/dL — ABNORMAL HIGH (ref 70–99)
Potassium: 4.3 mmol/L (ref 3.5–5.1)
Sodium: 138 mmol/L (ref 135–145)
Total Bilirubin: 0.7 mg/dL (ref 0.3–1.2)
Total Protein: 6 g/dL — ABNORMAL LOW (ref 6.5–8.1)

## 2018-07-05 LAB — GLUCOSE, CAPILLARY
Glucose-Capillary: 147 mg/dL — ABNORMAL HIGH (ref 70–99)
Glucose-Capillary: 159 mg/dL — ABNORMAL HIGH (ref 70–99)

## 2018-07-05 LAB — POC OCCULT BLOOD, ED: Fecal Occult Bld: POSITIVE — AB

## 2018-07-05 LAB — PREPARE RBC (CROSSMATCH)

## 2018-07-05 LAB — CBG MONITORING, ED: Glucose-Capillary: 137 mg/dL — ABNORMAL HIGH (ref 70–99)

## 2018-07-05 MED ORDER — SODIUM CHLORIDE (PF) 0.9 % IJ SOLN
INTRAMUSCULAR | Status: AC
  Filled 2018-07-05: qty 50

## 2018-07-05 MED ORDER — PEG-KCL-NACL-NASULF-NA ASC-C 100 G PO SOLR: 1.0000 | Freq: Once | ORAL | Status: DC

## 2018-07-05 MED ORDER — INSULIN ASPART 100 UNIT/ML ~~LOC~~ SOLN
0.0000 [IU] | SUBCUTANEOUS | Status: DC
  Administered 2018-07-05: 2 [IU] via SUBCUTANEOUS
  Administered 2018-07-05: 3 [IU] via SUBCUTANEOUS
  Administered 2018-07-06 (×2): 2 [IU] via SUBCUTANEOUS
  Administered 2018-07-06: 1 [IU] via SUBCUTANEOUS
  Administered 2018-07-07: 2 [IU] via SUBCUTANEOUS
  Administered 2018-07-07: 8 [IU] via SUBCUTANEOUS
  Administered 2018-07-07 – 2018-07-08 (×4): 2 [IU] via SUBCUTANEOUS

## 2018-07-05 MED ORDER — LORAZEPAM 2 MG/ML IJ SOLN
1.0000 mg | Freq: Two times a day (BID) | INTRAMUSCULAR | Status: DC | PRN
  Administered 2018-07-06: 1 mg via INTRAVENOUS
  Filled 2018-07-05: qty 1

## 2018-07-05 MED ORDER — PANTOPRAZOLE SODIUM 40 MG IV SOLR
40.0000 mg | Freq: Once | INTRAVENOUS | Status: AC
  Administered 2018-07-05: 40 mg via INTRAVENOUS
  Filled 2018-07-05: qty 40

## 2018-07-05 MED ORDER — SODIUM CHLORIDE 0.9 % IV SOLN
10.0000 mL/h | Freq: Once | INTRAVENOUS | Status: AC
  Administered 2018-07-05: 10 mL/h via INTRAVENOUS

## 2018-07-05 MED ORDER — PEG-KCL-NACL-NASULF-NA ASC-C 100 G PO SOLR
0.5000 | Freq: Once | ORAL | Status: AC
  Administered 2018-07-05: 100 g via ORAL
  Filled 2018-07-05: qty 1

## 2018-07-05 MED ORDER — SODIUM CHLORIDE 0.9 % IV SOLN
INTRAVENOUS | Status: DC
  Administered 2018-07-06 – 2018-07-07 (×2): via INTRAVENOUS

## 2018-07-05 MED ORDER — SODIUM CHLORIDE 0.9 % IV BOLUS
1000.0000 mL | Freq: Once | INTRAVENOUS | Status: AC
  Administered 2018-07-05: 1000 mL via INTRAVENOUS

## 2018-07-05 MED ORDER — IOHEXOL 350 MG/ML SOLN
100.0000 mL | Freq: Once | INTRAVENOUS | Status: AC | PRN
  Administered 2018-07-05: 100 mL via INTRAVENOUS

## 2018-07-05 MED ORDER — PEG-KCL-NACL-NASULF-NA ASC-C 100 G PO SOLR
0.5000 | Freq: Once | ORAL | Status: AC
  Administered 2018-07-05: 100 g via ORAL

## 2018-07-05 MED ORDER — SODIUM CHLORIDE 0.9% IV SOLUTION: Freq: Once | INTRAVENOUS | Status: DC

## 2018-07-05 MED ORDER — SODIUM CHLORIDE 0.9 % IV SOLN: INTRAVENOUS | Status: DC

## 2018-07-05 NOTE — Progress Notes (Signed)
ED TO INPATIENT HANDOFF REPORT  Name/Age/Gender Brent [name] 63 y.o. male  Code Status    Code Status Orders  (From admission, onward)         Start     Ordered   07/05/18 1421  Full code  Continuous     07/05/18 1423        Code Status History    Date Active Date Inactive Code Status Order ID Comments User Context   01/20/2016 1611 01/21/2016 1935 Full Code [id number]  Brent [name] Inpatient   03/29/2013 0040 04/03/2013 1537 Full Code [id number]  Brent Falconer, MD Inpatient   12/22/2011 0008 12/24/2011 1550 Full Code [id number]  Brent Neptune, RN Inpatient      Home/SNF/Other Home  Chief Complaint gi bleed  Level of Care/Admitting Diagnosis ED Disposition    ED Disposition Condition [place] [facility] Area: [facility] [[id number]]  Level of Care: Telemetry [5]  Admit to tele based on following criteria: Other see comments  Comments: GI bleed,CAD  Diagnosis: GI bleed [[id number]]  Admitting Physician: Brent [name] [[id number]]  Attending Physician: Brent [name] [[id number]]  Estimated length of stay: 3 - 4 days  Certification:: I certify this patient will need inpatient services for at least 2 midnights  PT Class (Do Not Modify): Inpatient [101]  PT Acc Code (Do Not Modify): Private [1]       Medical History Past Medical History:  Diagnosis Date  . Acid reflux disease   . ACID REFLUX DISEASE 07/05/2007  . Anemia   . Atherosclerosis   . Benign neoplasm of colon 07/05/2007  . Bilateral hip pain 10/05/2016  . Breast pain, left 11/17/2011  . Chest pain    a. Reportedly negative dobut echo performed prior to gastric bypass in 03/2011;  b. 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 12/2011 Cath: LM nl, LAD 50p, D1 74m LCX min irregs, OM3 30, RCA 25p, 755mFFR 0.99->0.89), PDA 30, EF 65%, Med Rx.  . COLONIC POLYPS, HX OF 10/29/2008  . Diarrhea 06/13/2010  . Diverticulosis 07/05/2018  . Diverticulosis of colon   . DIVERTICULOSIS, COLON 10/29/2008   . DM (diabetes mellitus), type 2, uncontrolled ([place]  . GI bleed   . Gout 03/04/2013  . Hearing loss 05/24/2013   Previous audiology evaluation completely.  . Marland KitchenTN (hypertension) 07/14/2010  . Hx of colonic polyps   . HYPERSOMNIA, ASSOCIATED WITH SLEEP APNEA 07/26/2008  . Hypertension 07/14/2010  . 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 Knee pain, left 10/10/2010  . Morbid obesity ([place]/11/2009   a. 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  . [place]land [place] pain 03/2015  . Other and unspecified hyperlipidemia 11/16/2012  . Preventative health care 11/17/2011  . Renal stone 11/2013  . Sleep apnea    a. CPAP  . Spinal stenosis   . Tear of meniscus of left knee 2012    Allergies Allergies  Allergen Reactions  . Bee Venom Anaphylaxis  . Lipitor [Atorvastatin]     Myalgias, memory changes  . Morphine     hyperactive  . Hydrocodone Itching  . Pravastatin Other (See Comments)    Made joints hurt. --also simvastatin     IV Location/Drains/Wounds Patient Lines/Drains/Airways Status   Active Line/Drains/Airways    Name:   Placement date:   Placement time:   Site:   Days:   Peripheral IV 07/05/18 Right Forearm   07/05/18    1156    Forearm  less than 1   Incision (Closed) 01/20/16 Knee Right   01/20/16    1445     897          Labs/Imaging Results for orders placed or performed during the hospital encounter of 07/05/18 (from the past 48 hour(s))  CBG monitoring, ED     Status: Abnormal   Collection Time: 07/05/18 10:56 AM  Result Value Ref Range   Glucose-Capillary 137 (H) 70 - 99 mg/dL  Comprehensive metabolic panel     Status: Abnormal   Collection Time: 07/05/18 11:56 AM  Result Value Ref Range   Sodium 138 135 - 145 mmol/L   Potassium 4.3 3.5 - 5.1 mmol/L   Chloride 107 98 - 111 mmol/L   CO2 24 22 - 32 mmol/L   Glucose, Bld 165 (H) 70 - 99 mg/dL   BUN 24 (H) 8 - 23 mg/dL   Creatinine, Ser 1.11 0.61 - 1.24 mg/dL   Calcium 8.5 (L) 8.9 - 10.3 mg/dL   Total Protein 6.0 (L) 6.5 - 8.1 g/dL    Albumin 3.6 3.5 - 5.0 g/dL   AST 21 15 - 41 U/L   ALT 16 0 - 44 U/L   Alkaline Phosphatase 28 (L) 38 - 126 U/L   Total Bilirubin 0.7 0.3 - 1.2 mg/dL   GFR calc non Af Amer >60 >60 mL/min   GFR calc Af Amer >60 >60 mL/min   Anion gap 7 5 - 15    Comment: Performed at [facility], [place] [address], [place], [place] [address]  CBC     Status: Abnormal   Collection Time: 07/05/18 11:56 AM  Result Value Ref Range   WBC 6.0 4.0 - 10.5 K/uL   RBC 2.83 (L) 4.22 - 5.81 MIL/uL   Hemoglobin 7.2 (L) 13.0 - 17.0 g/dL   HCT 24.6 (L) 39.0 - 52.0 %   MCV 86.9 80.0 - 100.0 fL   MCH 25.4 (L) 26.0 - 34.0 pg   MCHC 29.3 (L) 30.0 - 36.0 g/dL   RDW 15.9 (H) 11.5 - 15.5 %   Platelets 224 150 - 400 K/uL   nRBC 0.0 0.0 - 0.2 %    Comment: Performed at [facility], [place] [address], [place], [place] [address]  Type and screen [place]     Status: None (Preliminary result)   Collection Time: 07/05/18 11:56 AM  Result Value Ref Range   ABO/RH(D) A POS    Antibody Screen NEG    Sample Expiration 07/08/2018    Unit Number [id number]    Blood Component Type RED CELLS,LR    Unit division 00    Status of Unit ISSUED    Transfusion Status OK TO TRANSFUSE    Crossmatch Result      Compatible Performed at [facility], [place] [address] [place], [place] [address]   POC occult blood, ED     Status: Abnormal   Collection Time: 07/05/18 12:02 PM  Result Value Ref Range   Fecal Occult Bld POSITIVE (A) NEGATIVE  Prepare RBC     Status: None   Collection Time: 07/05/18 12:19 PM  Result Value Ref Range   Order Confirmation      ORDER PROCESSED BY BLOOD BANK Performed at [place] [address] [place], [place] [address]   Hemoglobin and hematocrit, blood     Status: Abnormal   Collection Time: 07/05/18  2:17 PM  Result Value Ref  Range   Hemoglobin 6.0 (LL) 13.0 - 17.0 g/dL    Comment: This critical  result has verified and been called to K ZULETA,RN by Braulio Conte on 03 17 2020 at 1513, and has been read back.    HCT 20.5 (L) 39.0 - 52.0 %    Comment: Performed at [facility], [place] [address] [place], [place] [address]   No results found.  Pending Labs Unresulted Labs (From admission, onward)    Start     Ordered   07/06/18 9753  Basic metabolic panel  Tomorrow morning,   R     07/05/18 1423   07/06/18 0500  Magnesium  Tomorrow morning,   R     07/05/18 1423   07/05/18 1422  Hemoglobin and hematocrit, blood  Now then every 4 hours,   R     07/05/18 1423   07/05/18 1417  HIV antibody (Routine Testing)  Once,   R     07/05/18 1423          Vitals/Pain Today's Vitals   07/05/18 1330 07/05/18 1345 07/05/18 1346 07/05/18 1420  BP: 130/69 121/77  136/74  Pulse: 77 77  82  Resp: '18  18 18  ' Temp:   99.1 F (37.3 C) 98.9 F (37.2 C)  TempSrc:   Oral Oral  SpO2: 100% 100%  100%  Weight:      Height:      PainSc:        Isolation Precautions No active isolations  Medications Medications  0.9 %  sodium chloride infusion (Manually program via Guardrails IV Fluids) (has no administration in time range)  0.9 %  sodium chloride infusion (has no administration in time range)  LORazepam (ATIVAN) injection 1 mg (has no administration in time range)  peg 3350 powder (MOVIPREP) kit 200 g (has no administration in time range)  0.9 %  sodium chloride infusion (10 mL/hr Intravenous New Bag/Given 07/05/18 1416)  sodium chloride 0.9 % bolus 1,000 mL (1,000 mLs Intravenous New Bag/Given 07/05/18 1247)  pantoprazole (PROTONIX) injection 40 mg (40 mg Intravenous Given 07/05/18 1318)    Mobility walks

## 2018-07-05 NOTE — Telephone Encounter (Signed)
Pt's wife called to report that pt is having GI bleed--bright red blood during BM and dark stool.  Pt would like to know whether to schedule OV or go to [facility].  Please advise.

## 2018-07-05 NOTE — ED Triage Notes (Signed)
Pt reports that this morning he had a large BM that was dark with lots of bright red blood mixed in. Pt's wife reports he has been taking a lot of Goody's powders as his arthritis has flared up. Pt reports he has a hx of diabetes, gastric bypass, and HTN. Pt reports he is dizzy and his complexion is pale

## 2018-07-05 NOTE — Consult Note (Signed)
Referring Provider: No ref. provider found Primary Care Physician:  [name], MD Primary Gastroenterologist:  Dr. Scarlette Shorts   Reason for Consultation:  GI bleeding, anemia   HPI: Brent [name] is a 63 y.o. 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 Left shoulder surgery 03/2018. He noticed his Hg level on My chart showed a gradual decrease 12 down to 10.8 over the past year. He was concerned taking Pantoprazole 29m bid chronically caused the decrease in his Hg level. He contacted his PCP regarding long term PPI use and he was advised to continue Pantoprazole 451min the am and to stop the PM dose of Pantoprazole and to take Pepcid 4071mt bed time which he started 4 weeks ago. No heartburn, dysphagia or stomach pain. He reports taking GooGabriel Earingwder 1 packet daily for the past 1 to 2 years due to arthritis and left shoulder pain. He had worse left shoulder pain so for the past month he has taken GooCorning Incorporatedto 3 packets daily. He takes Tylenol intermittently. He also takes Tramadol 59m63mtab 5 x daily and Hydrocodone 5/325mg45mab daily for arthritis and left shoulder pain. He awakened this morning and saw a moderate amount of red blood on top of a soft to loose brown stool with associated right mid abdominal pain. No nausea or vomiting. He was concerned he was having another GI bleed. He stated his right mid abdominal pain is similar to the pain he had when he suffered from a GI bleed in 2015, but the blood per the rectum then was purple and black. He is dizzy when standing up. No chest pain or SOB. He drinks 3 to 4 beers every evening. He has not eaten solid food for 48 hours. His wife is present.   ED course: Sodium 138.  Potassium 4.3.  Glucose 165.  BUN 24 (BUN 17 06/07/2018)  Creatinine 1.11 (baseline1.0)   Alk phos 78.  Albumin 3.6.  AST 21.  ALT 16.  Total bili 0.7.  WBC 6.0.  Hemoglobin 7.2 ( 10.8, 11.4. 12.1)    Hematocrit 24.6.  MCV 86.9.  Platelet 224. Type and screen sent.  To be transfused with 1 unit PRBCs.  Protonix 40 mg IV twice daily.  GI History:  GI Bleed 03/29/2013: He was admitted to hospital s/p EGD 03/27/13 by Dr. PerryHenrene Pastorh showed normal post op Roux en Y. Hg dropped 5gms. Stool Heme +.  He Reported taking Prednisone and Cholchicine  for gout + took ASA 81mg 63m Bleeding scan was positive for bleeding in the epigastric region. He was not sent to IR for angio/embol. He received PRBC transfusion, H/H stabilized and hematochezia stopped. No further procedures completed and he was discharged home.  Colonoscopy 07/27/2016: A 2 mm TA polyp to the ascending colon removed by cold snare, internal hemorrhoids.  EGD pre gastric bypass 03/02/2011: normal esophagus, fundic gland polyps Colonoscopy 03/23/2011: Diminutive polyp in the ascending colon    12/2011 ECHO: EF 55 to 60% 12/2011: Cardiac catheterization 2013: patient reports 3 minor blockages tx'd medically    Past Medical History:  Diagnosis Date  . Acid reflux disease   . ACID REFLUX DISEASE 07/05/2007  . Anemia   . Atherosclerosis   . Benign neoplasm of colon 07/05/2007  . Bilateral hip pain 10/05/2016  . Breast pain, left 11/17/2011  . Chest pain    a. Reportedly  negative dobut echo performed prior to gastric bypass in 03/2011;  b. 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 12/2011 Cath: LM nl, LAD 50p, D1 59m LCX min irregs, OM3 30, RCA 25p, 727mFFR 0.99->0.89), PDA 30, EF 65%, Med Rx.  . COLONIC POLYPS, HX OF 10/29/2008  . Diarrhea 06/13/2010  . Diverticulosis of colon   . DIVERTICULOSIS, COLON 10/29/2008  . DM (diabetes mellitus), type 2, uncontrolled ([facility]  . GI bleed   . Gout 03/04/2013  . Hearing loss 05/24/2013   Previous audiology evaluation completely.  . Marland KitchenTN (hypertension) 07/14/2010  . Hx of colonic polyps   . HYPERSOMNIA, ASSOCIATED WITH SLEEP APNEA 07/26/2008  . Hypertension 07/14/2010  . 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 Knee pain, left 10/10/2010  . Morbid obesity ([facility]/11/2009   a. 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  . Marland [place] pain 03/2015  . Other and unspecified hyperlipidemia 11/16/2012  . Preventative health care 11/17/2011  . Renal stone 11/2013  . Sleep apnea    a. CPAP  . Spinal stenosis   . Tear of meniscus of left knee 2012    Past Surgical History:  Procedure Laterality Date  . ANKLE SURGERY  1994  . CARDIAC CATHETERIZATION     denies any chest pain in the past 2 years  . COLONOSCOPY    . colonoscopy polyps    . ESOPHAGOGASTRODUODENOSCOPY N/A 03/27/2013   Procedure: ESOPHAGOGASTRODUODENOSCOPY (EGD);  Surgeon: JoIrene ShipperMD;  [facility];  Service: Endoscopy;  Laterality: N/A;  . GASTRIC BYPASS    . HERNIA REPAIR    . KNEE ARTHROSCOPY  11/06/10   Left, torn meniscus (repaired)  . LEFT HEART CATHETERIZATION WITH CORONARY ANGIOGRAM N/A 12/23/2011   Procedure: LEFT HEART CATHETERIZATION WITH CORONARY ANGIOGRAM;  Surgeon: JaMinus BreedingMD;  [facility];  Service: Cardiovascular;  Laterality: N/A;  . right knee arthroscopy Right 07/05/14   Dr. [place] . TONSILLECTOMY  age 43 27. TOTAL KNEE ARTHROPLASTY Right 01/20/2016   Procedure: RIGHT TOTAL KNEE ARTHROPLASTY;  Surgeon: MaParalee CancelMD;  Location: [facility];  Service: Orthopedics;  Laterality: Right;  . UPPER GI ENDOSCOPY  03/27/13    Prior to Admission medications   Medication Sig Start Date End Date Taking? Authorizing Provider  Cholecalciferol (VITAMIN D-3) 5000 UNITS TABS Take 1 tablet by mouth every morning.     [provider]  Cyanocobalamin (VITAMIN B-12) 1000 MCG SUBL Place 1 each under the tongue every morning.     [provider]  EPIPEN 2-PAK 0.3 MG/0.3ML SOAJ injection Inject 0.3 mg into the skin once. 04/12/18   [provider]  famotidine (PEPCID) 40 MG tablet Take 1 tablet (40 mg total) by mouth at bedtime as needed for heartburn or indigestion. 06/14/18   BlMosie LukesMD  fenofibrate 160 MG tablet Take 1 tablet (160 mg total) by mouth daily. 01/10/18   BlMosie LukesMD  gabapentin (NEURONTIN) 300 MG capsule Take 1 capsule (300 mg total) by mouth at bedtime. 06/07/18   BlMosie LukesMD  glucose blood test strip Use as directed once daily to check blood sugar.  Diagnosis code E11.9 12/14/16   BlMosie LukesMD  HYDROcodone-acetaminophen (NORCO) 5-325 MG tablet Take 1 tablet by mouth daily. 05/17/18   BlMosie LukesMD  Krill Oil 1000 MG CAPS Take 1 tablet by mouth 2 (two) times daily.     [provider]  LIVALO 1 MG TABS TAKE 1 TABLET DAILY Patient taking differently: Take 1 mg by mouth daily.  05/02/18   [name], MD  Magnesium 100 MG CAPS Take 1 capsule (100 mg total) by mouth daily. 06/29/17   [name], MD  metFORMIN (GLUCOPHAGE) 500 MG tablet TAKE 1 TABLET FOUR TIMES A DAY Patient taking differently: Take 500 mg by mouth 4 (four) times daily.  04/12/17   [name], MD  methocarbamol (ROBAXIN) 500 MG tablet TAKE ONE TABLET BY MOUTH EVERY 6 HOURS AS NEEDED FOR MUSCLE SPASMS Patient taking differently: Take 500 mg by mouth every 6 (six) hours as needed for muscle spasms.  08/23/17   [name], MD  metoprolol tartrate (LOPRESSOR) 25 MG tablet TAKE 1 TABLET TWICE A DAY Patient taking differently: Take 25 mg by mouth 2 (two) times daily.  06/28/18   [name], MD  mometasone (ELOCON) 0.1 % cream Apply 1 application topically daily. 10/28/16   [name], MD  Multiple Vitamin (MULTIVITAMIN) tablet Take 1 tablet by mouth 2 (two) times daily. BARIATRIC VIT    [provider]  pantoprazole (PROTONIX) 40 MG tablet Take 1 tablet (40 mg total) by mouth 2 (two) times daily before a meal. 01/10/18   [name], MD  traMADol (ULTRAM) 50 MG tablet Take 1 tablet po tid and 2 tablet po qhs Patient taking differently: Take 50-100 mg by mouth 4 (four) times daily. 31m tid then 1058mat bedtime 06/17/18   BlMosie LukesMD  Vitamin D, Ergocalciferol, (DRISDOL) 1.25 MG (50000 UT) CAPS capsule TAKE 1 CAPSULE EVERY 7 DAYS Patient taking differently: Take 50,000 Units by mouth every 7 (seven) days.  06/15/18   BlMosie LukesMD  ramipril (ALTACE) 10 MG tablet Take 1 tablet (10 mg total) by mouth daily. 2 tabs po daily 03/02/11 07/14/11  BlMosie LukesMD    Current Facility-Administered Medications  Medication Dose Route Frequency Provider Last Rate Last Dose  . 0.9 %  sodium chloride infusion  10 mL/hr Intravenous Once GeRecardo EvangelistPA-C      . pantoprazole (PROTONIX) injection 40 mg  40 mg Intravenous Once GeRecardo EvangelistPA-C       Current Outpatient Medications  Medication Sig Dispense Refill  . Cholecalciferol (VITAMIN D-3) 5000 UNITS TABS Take 1 tablet by mouth every morning.     . Cyanocobalamin (VITAMIN B-12) 1000 MCG SUBL Place 1 each under the tongue every morning.     . Marland KitchenPIPEN 2-PAK 0.3 MG/0.3ML SOAJ injection Inject 0.3 mg into the skin once.    . famotidine (PEPCID) 40 MG tablet Take 1 tablet (40 mg total) by mouth at bedtime as needed for heartburn or indigestion. 90 tablet 1  . fenofibrate 160 MG tablet Take 1 tablet (160 mg total) by mouth daily. 90 tablet 1  . gabapentin (NEURONTIN) 300 MG capsule Take 1 capsule (300 mg total) by mouth at bedtime. 90 capsule 0  . glucose blood test strip Use as directed once daily to check blood sugar.  Diagnosis code E11.9 100 each 12  . HYDROcodone-acetaminophen (NORCO) 5-325 MG tablet Take 1 tablet by mouth daily. 30 tablet 0  . Krill Oil 1000 MG CAPS Take 1 tablet by mouth 2 (two) times daily.     . Marland KitchenIVALO 1 MG TABS TAKE 1 TABLET DAILY (Patient taking differently: Take 1 mg by mouth daily. ) 90 tablet 4  . Magnesium 100 MG CAPS  Take 1 capsule (100 mg total) by mouth daily. 30 capsule 1  . metFORMIN (GLUCOPHAGE) 500 MG tablet TAKE 1 TABLET FOUR TIMES A DAY (Patient taking differently: Take 500 mg by mouth 4 (four) times daily. ) 360  tablet 1  . methocarbamol (ROBAXIN) 500 MG tablet TAKE ONE TABLET BY MOUTH EVERY 6 HOURS AS NEEDED FOR MUSCLE SPASMS (Patient taking differently: Take 500 mg by mouth every 6 (six) hours as needed for muscle spasms. ) 120 tablet 5  . metoprolol tartrate (LOPRESSOR) 25 MG tablet TAKE 1 TABLET TWICE A DAY (Patient taking differently: Take 25 mg by mouth 2 (two) times daily. ) 180 tablet 3  . mometasone (ELOCON) 0.1 % cream Apply 1 application topically daily. 50 g 1  . Multiple Vitamin (MULTIVITAMIN) tablet Take 1 tablet by mouth 2 (two) times daily. BARIATRIC VIT    . pantoprazole (PROTONIX) 40 MG tablet Take 1 tablet (40 mg total) by mouth 2 (two) times daily before a meal. 180 tablet 3  . traMADol (ULTRAM) 50 MG tablet Take 1 tablet po tid and 2 tablet po qhs (Patient taking differently: Take 50-100 mg by mouth 4 (four) times daily. 36m tid then 1070mat bedtime) 105 tablet 0  . Vitamin D, Ergocalciferol, (DRISDOL) 1.25 MG (50000 UT) CAPS capsule TAKE 1 CAPSULE EVERY 7 DAYS (Patient taking differently: Take 50,000 Units by mouth every 7 (seven) days. ) 12 capsule 4    Allergies as of 07/05/2018 - Review Complete 07/05/2018  Allergen Reaction Noted  . Bee venom Anaphylaxis 09/10/2010  . Lipitor [atorvastatin]  01/12/2012  . Morphine    . Hydrocodone Itching 07/13/2016  . Pravastatin Other (See Comments) 12/15/2015    Family History  Problem Relation Age of Onset  . Diabetes Mother   . Hypertension Mother   . Stroke Mother   . Hyperlipidemia Mother   . Hypertension Father   . Colon polyps Father   . Heart attack Father 6530. Stroke Father   . Heart attack Brother   . Diabetes Brother   . Heart disease Brother   . Heart attack Brother        Multiple  . Diabetes Brother   . Other Brother        heart problems  . Heart disease Brother   . Diabetes Sister   . Obesity Brother   . Diabetes Brother   . Heart disease Brother   . Hypertension Maternal Grandmother   . ADD / ADHD  Daughter   . Heart disease Brother   . Stomach cancer Neg Hx   . Colon cancer Neg Hx   . Esophageal cancer Neg Hx   . Rectal cancer Neg Hx     Social History   Socioeconomic History  . Marital status: Married    Spouse name: Not on file  . Number of children: 2  . Years of education: Not on file  . Highest education level: Not on file  Occupational History  . Occupation: TEProduct managerGUBrightonSocial Needs  . Financial resource strain: Not on file  . Food insecurity:    Worry: Not on file    Inability: Not on file  . Transportation needs:    Medical: Not on file    Non-medical: Not on file  Tobacco Use  . Smoking status: Former Smoker    Packs/day: 1.50    Years: 20.00    Pack years: 30.00    Types: Cigarettes  Last attempt to quit: 04/21/1991    Years since quitting: 27.2  . Smokeless tobacco: Never Used  Substance and Sexual Activity  . Alcohol use: Yes    Alcohol/week: 3.0 standard drinks    Types: 3 Cans of beer per week    Comment: social  . Drug use: No  . Sexual activity: Not on file  Lifestyle  . Physical activity:    Days per week: Not on file    Minutes per session: Not on file  . Stress: Not on file  Relationships  . Social connections:    Talks on phone: Not on file    Gets together: Not on file    Attends religious service: Not on file    Active member of club or organization: Not on file    Attends meetings of clubs or organizations: Not on file    Relationship status: Not on file  . Intimate partner violence:    Fear of current or ex partner: Not on file    Emotionally abused: Not on file    Physically abused: Not on file    Forced sexual activity: Not on file  Other Topics Concern  . Not on file  Social History Narrative   Lives with wife in [place].  Does not routinely exercise.    Review of Systems: Gen: Denies any fever, sweats, chills or weight loss CV: Denies chest pain or palpitations Resp: Denies cough  or SOB GI: see HPI.  GU : Denies urinary burning or blood in urine MS: see HPI   Derm: Denies rash or skin lesions Psych: Denies depression or anxiety  Heme: Denies bruising, bleeding, and enlarged lymph nodes. Neuro:  + dizziness when standing up today Endo:  Denies thyroid problems, ? DM  Physical Exam: Vital signs in last 24 hours: Temp:  [98.9 F (37.2 C)] 98.9 F (37.2 C) (03/17 1056) Pulse Rate:  [75-80] 76 (03/17 1230) Resp:  [17] 17 (03/17 1056) BP: (101-126)/(57-74) 101/74 (03/17 1230) SpO2:  [100 %] 100 % (03/17 1230) Weight:  [106.6 kg] 106.6 kg (03/17 1055)   General:   Alert,  Well-developed, well-nourished, pleasant and cooperative in NAD Head:  Normocephalic and atraumatic. Eyes:  Sclera clear, no icterus.   Conjunctiva pink. Mouth:  No deformity or lesions.   Neck:  Supple Lungs:  Clear throughout to auscultation.   No wheezes, crackles, or rhonchi.  Heart:  Regular rate and rhythm; no murmurs, clicks, rubs,  or gallops. Abdomen: Soft, mild right mid abdominal tenderness without rebound or guarding, + BA x 4 quads, RUQ  Scar intact.  Rectal:  Deferred , ED PA-C reported maroon colored stool in rectum. Msk:  Symmetrical without gross deformities.  Pulses:  Normal pulses noted. Extremities:  Without clubbing or edema. Neurologic:  Alert and  oriented x4;  grossly normal neurologically. Skin:  Intact without significant lesions or rashes.. Psych:  Alert and cooperative. Normal mood and affect.  Intake/Output from previous day: No intake/output data recorded. Intake/Output this shift: No intake/output data recorded.  Lab Results: Recent Labs    07/05/18 1156  WBC 6.0  HGB 7.2*  HCT 24.6*  PLT 224   BMET Recent Labs    07/05/18 1156  NA 138  K 4.3  CL 107  CO2 24  GLUCOSE 165*  BUN 24*  CREATININE 1.11  CALCIUM 8.5*   LFT Recent Labs    07/05/18 1156  PROT 6.0*  ALBUMIN 3.6  AST 21  ALT 16  ALKPHOS  37*  BILITOT 0.7      IMPRESSION/PLAN:  1. 62.y.o. male s/p Roux en Y bypass 2012, GIB  S/P EGD 03/2013 who presents to the ED with bright red blood per the rectum and right mid abdominal pain. Hx NSAID use, taking Goody Powder 1 to 3 packets daily x 4 weeks, 1 packet daily past yr. Hg 7.2 down from 10.8 on 06/07/2018. BUN 24 up from 17. -NPO -EGD  -PPI IV BID -IVF per hospitalist -agree with PRBC infusion, transfuse to keep Hg > 8 -No NSAIDS/Goody Powder   2. History of colon polyps -? Colonoscopy if EGD negative  3. Alcohol Abuse. Drinks 3 to 4 beers daily -monitor for withdrawal   4. Chronic arthritis, left shoulder pain -follow up with pain management as out patient, no NSAIDS          Noralyn Pick  07/05/2018, 12:52 PM

## 2018-07-05 NOTE — Progress Notes (Signed)
Clarified with Dr. Sherral Hammers- pt to be transfused two units of PRBC

## 2018-07-05 NOTE — ED Provider Notes (Signed)
[facility] DEPT Provider Note   CSN: [id number] Arrival date & time: 07/05/18  1042    History   Chief Complaint Chief Complaint  Patient presents with  . GI Bleeding  . Dizziness    HPI Brent [name] is a 63 y.o. male who presents with GI bleed.  Past medical history significant for GERD, diverticulosis, colon polyps, internal hemorrhoids, hypertension, morbid obesity status post gastric bypass. The patient states that his hemoglobin has been steadily dropping over the past several months.  He has had a Hemoccult in the past which was negative.  This morning he went to have a bowel movement and there was bright red blood along with dark appearing stool in the toilet. He has been taking Protonix and Pepcid but has also been taking a lot of BC powders for his arthritis. He felt very lightheaded when up moving around but has not passed out.  Feels very similar as to when he had a GI bleed in the past and had to have a transfusion.  He reports some coming and going intermittent peri-umbilical abdominal pain which is mild in nature.  No fevers, nausea, vomiting.  He is followed by Dr. Hen[facility].  He is not on blood thinners.  HHis last colonoscopy was in 2018 he had polyps and internal hemorrhoids.  He had endoscopy in 2014 which was normal.   HPI  Past Medical History:  Diagnosis Date  . Acid reflux disease   . ACID REFLUX DISEASE 07/05/2007  . Anemia   . Atherosclerosis   . Benign neoplasm of colon 07/05/2007  . Bilateral hip pain 10/05/2016  . Breast pain, left 11/17/2011  . Chest pain    a. Reportedly negative dobut echo performed prior to gastric bypass in 03/2011;  b. 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 12/2011 Cath: LM nl, LAD 50p, D1 74m, LCX min irregs, OM3 30, RCA 25p, 8m (FFR 0.99->0.89), PDA 30, EF 65%, Med Rx.  . COLONIC POLYPS, HX OF 10/29/2008  . Diarrhea 06/13/2010  . Diverticulosis of colon   . DIVERTICULOSIS, COLON 10/29/2008   . DM (diabetes mellitus), type 2, uncontrolled ([place])   . GI bleed   . Gout 03/04/2013  . Hearing loss 05/24/2013   Previous audiology evaluation completely.  [name] HTN (hypertension) 07/14/2010  . Hx of colonic polyps   . HYPERSOMNIA, ASSOCIATED WITH SLEEP APNEA 07/26/2008  . Hypertension 07/14/2010  . 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 Knee pain, left 10/10/2010  . Morbid obesity ([place]) 03/27/2010   a. 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  [name] Neck pain 03/2015  . Other and unspecified hyperlipidemia 11/16/2012  . Preventative health care 11/17/2011  . Renal stone 11/2013  . Sleep apnea    a. CPAP  . Spinal stenosis   . Tear of meniscus of left knee 2012    Patient Active Problem List   Diagnosis Date Noted  . Insomnia 06/07/2018  . Shoulder pain 01/30/2018  . Bilateral hip pain 10/05/2016  . Chronic pain of both ankles 09/14/2016  . Pain of both hip joints 09/14/2016  . Vitamin B12 deficiency 09/14/2016  . 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 Trapezius muscle spasm 04/02/2015  . Neck pain, bilateral 04/02/2015  . Headache(784.0) 11/26/2013  . Nephrolithiasis 09/27/2013  . Tinea corporis 09/27/2013  . Preventative health care 05/24/2013  . Hearing loss 05/24/2013  . Anemia 03/28/2013  . 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 Hx of adenomatous  colonic polyps 03/23/2013  . Gout 03/04/2013  . Hyperlipidemia, mixed 11/16/2012  . Obstructive sleep apnea on CPAP 04/07/2012  . CAD (coronary artery disease) 01/12/2012  . Vitamin D deficiency 10/14/2011  . Gastroesophageal reflux disease 04/01/2011  . Chronic pain of both knees 10/10/2010  . Essential hypertension 07/14/2010  . Morbid obesity ([place]) 03/27/2010  . DIVERTICULOSIS, COLON 10/29/2008  . T2DM (type 2 diabetes mellitus) ([place]) 07/05/2007    Past Surgical History:  Procedure Laterality Date  . ANKLE SURGERY  1994  . CARDIAC CATHETERIZATION     denies any chest pain in the past 2 years  . COLONOSCOPY    . colonoscopy polyps     . ESOPHAGOGASTRODUODENOSCOPY N/A 03/27/2013   Procedure: ESOPHAGOGASTRODUODENOSCOPY (EGD);  Surgeon: Irene Shipper, MD;  Location: [facility];  Service: Endoscopy;  Laterality: N/A;  . GASTRIC BYPASS    . HERNIA REPAIR    . KNEE ARTHROSCOPY  11/06/10   Left, torn meniscus (repaired)  . LEFT HEART CATHETERIZATION WITH CORONARY ANGIOGRAM N/A 12/23/2011   Procedure: LEFT HEART CATHETERIZATION WITH CORONARY ANGIOGRAM;  Surgeon: Minus Breeding, MD;  Location: [facility] CATH LAB;  Service: Cardiovascular;  Laterality: N/A;  . right knee arthroscopy Right 07/05/14   Dr. Hart Robinsons, [place].  . TONSILLECTOMY  age 29  . TOTAL KNEE ARTHROPLASTY Right 01/20/2016   Procedure: RIGHT TOTAL KNEE ARTHROPLASTY;  Surgeon: Paralee Cancel, MD;  Location: [facility];  Service: Orthopedics;  Laterality: Right;  . UPPER GI ENDOSCOPY  03/27/13        Home Medications    Prior to Admission medications   Medication Sig Start Date End Date Taking? Authorizing Provider  Cholecalciferol (VITAMIN D-3) 5000 UNITS TABS Take 1 tablet by mouth every morning.     [provider]  Cyanocobalamin (VITAMIN B-12) 1000 MCG SUBL Place 1 each under the tongue every morning.     [provider]  EPIPEN 2-PAK 0.3 MG/0.3ML SOAJ injection Inject 0.3 mg into the skin once. 04/12/18   [provider]  famotidine (PEPCID) 40 MG tablet Take 1 tablet (40 mg total) by mouth at bedtime as needed for heartburn or indigestion. 06/14/18   [name], MD  fenofibrate 160 MG tablet Take 1 tablet (160 mg total) by mouth daily. 01/10/18   [name], MD  gabapentin (NEURONTIN) 300 MG capsule Take 1 capsule (300 mg total) by mouth at bedtime. 06/07/18   [name], MD  glucose blood test strip Use as directed once daily to check blood sugar.  Diagnosis code E11.9 12/14/16   [name], MD  HYDROcodone-acetaminophen (NORCO) 5-325 MG tablet Take 1 tablet by mouth daily. 05/17/18   [name], MD  Krill Oil 1000  MG CAPS Take 1 tablet by mouth 2 (two) times daily.     [provider]  LIVALO 1 MG TABS TAKE 1 TABLET DAILY Patient taking differently: Take 1 mg by mouth daily.  05/02/18   [name], MD  Magnesium 100 MG CAPS Take 1 capsule (100 mg total) by mouth daily. 06/29/17   [name], MD  metFORMIN (GLUCOPHAGE) 500 MG tablet TAKE 1 TABLET FOUR TIMES A DAY Patient taking differently: Take 500 mg by mouth 4 (four) times daily.  04/12/17   [name], MD  methocarbamol (ROBAXIN) 500 MG tablet TAKE ONE TABLET BY MOUTH EVERY 6 HOURS AS NEEDED FOR MUSCLE SPASMS Patient taking differently: Take 500 mg by mouth every 6 (six) hours as  needed for muscle spasms.  08/23/17   [name], MD  metoprolol tartrate (LOPRESSOR) 25 MG tablet TAKE 1 TABLET TWICE A DAY Patient taking differently: Take 25 mg by mouth 2 (two) times daily.  06/28/18   [name], MD  mometasone (ELOCON) 0.1 % cream Apply 1 application topically daily. 10/28/16   [name], MD  Multiple Vitamin (MULTIVITAMIN) tablet Take 1 tablet by mouth 2 (two) times daily. BARIATRIC VIT    [provider]  pantoprazole (PROTONIX) 40 MG tablet Take 1 tablet (40 mg total) by mouth 2 (two) times daily before a meal. 01/10/18   [name], MD  traMADol (ULTRAM) 50 MG tablet Take 1 tablet po tid and 2 tablet po qhs Patient taking differently: Take 50-100 mg by mouth 4 (four) times daily. 50mg  tid then 100mg  at bedtime 06/17/18   [name], MD  Vitamin D, Ergocalciferol, (DRISDOL) 1.25 MG (50000 UT) CAPS capsule TAKE 1 CAPSULE EVERY 7 DAYS Patient taking differently: Take 50,000 Units by mouth every 7 (seven) days.  06/15/18   [name], MD  ramipril (ALTACE) 10 MG tablet Take 1 tablet (10 mg total) by mouth daily. 2 tabs po daily 03/02/11 07/14/11  [name], MD    Family History Family History  Problem Relation Age of Onset  . Diabetes Mother   . Hypertension Mother   . Stroke Mother   .  Hyperlipidemia Mother   . Hypertension Father   . Colon polyps Father   . Heart attack Father 61  . Stroke Father   . Heart attack Brother   . Diabetes Brother   . Heart disease Brother   . Heart attack Brother        Multiple  . Diabetes Brother   . Other Brother        heart problems  . Heart disease Brother   . Diabetes Sister   . Obesity Brother   . Diabetes Brother   . Heart disease Brother   . Hypertension Maternal Grandmother   . ADD / ADHD Daughter   . Heart disease Brother   . Stomach cancer Neg Hx   . Colon cancer Neg Hx   . Esophageal cancer Neg Hx   . Rectal cancer Neg Hx     Social History Social History   Tobacco Use  . Smoking status: Former Smoker    Packs/day: 1.50    Years: 20.00    Pack years: 30.00    Types: Cigarettes    Last attempt to quit: 04/21/1991    Years since quitting: 27.2  . Smokeless tobacco: Never Used  Substance Use Topics  . Alcohol use: Yes    Alcohol/week: 3.0 standard drinks    Types: 3 Cans of beer per week    Comment: social  . Drug use: No     Allergies   Bee venom; Lipitor [atorvastatin]; Morphine; Hydrocodone; and Pravastatin   Review of Systems Review of Systems  Constitutional: Negative for chills and fever.  Respiratory: Negative for shortness of breath.   Cardiovascular: Negative for chest pain.  Gastrointestinal: Positive for abdominal pain and blood in stool. Negative for diarrhea, nausea and vomiting.  Neurological: Positive for light-headedness. Negative for syncope.  Hematological: Does not bruise/bleed easily.  All other systems reviewed and are negative.    Physical Exam Updated Vital Signs BP (!) 126/57 (BP Location: Left Arm)   Pulse 80   Temp 98.9 F (37.2 C) (Oral)  Resp 17   Ht 5\' 8"  (1.727 m)   Wt 106.6 kg   SpO2 100%   BMI 35.73 kg/m   Physical Exam Vitals signs and nursing note reviewed.  Constitutional:      General: He is not in acute distress.    Appearance: He is  well-developed. He is obese. He is not ill-appearing.     Comments: Calm, cooperative. Pale conjunctiva  HENT:     Head: Normocephalic and atraumatic.  Eyes:     General: No scleral icterus.       Right eye: No discharge.        Left eye: No discharge.     Conjunctiva/sclera: Conjunctivae normal.     Pupils: Pupils are equal, round, and reactive to light.  Neck:     Musculoskeletal: Normal range of motion.  Cardiovascular:     Rate and Rhythm: Normal rate and regular rhythm.  Pulmonary:     Effort: Pulmonary effort is normal. No respiratory distress.     Breath sounds: Normal breath sounds.  Abdominal:     General: There is no distension.     Palpations: Abdomen is soft.     Tenderness: There is no abdominal tenderness.  Genitourinary:    Comments: Rectal: Gross maroon colored blood Skin:    General: Skin is warm and dry.  Neurological:     Mental Status: He is alert and oriented to person, place, and time.  Psychiatric:        Behavior: Behavior normal.      ED Treatments / Results  Labs (all labs ordered are listed, but only abnormal results are displayed) Labs Reviewed  COMPREHENSIVE METABOLIC PANEL - Abnormal; Notable for the following components:      Result Value   Glucose, Bld 165 (*)    BUN 24 (*)    Calcium 8.5 (*)    Total Protein 6.0 (*)    Alkaline Phosphatase 28 (*)    All other components within normal limits  CBC - Abnormal; Notable for the following components:   RBC 2.83 (*)    Hemoglobin 7.2 (*)    HCT 24.6 (*)    MCH 25.4 (*)    MCHC 29.3 (*)    RDW 15.9 (*)    All other components within normal limits  POC OCCULT BLOOD, ED - Abnormal; Notable for the following components:   Fecal Occult Bld POSITIVE (*)    All other components within normal limits  CBG MONITORING, ED - Abnormal; Notable for the following components:   Glucose-Capillary 137 (*)    All other components within normal limits  POC OCCULT BLOOD, ED  TYPE AND SCREEN  PREPARE  RBC (CROSSMATCH)    EKG None  Radiology No results found.  Procedures Procedures (including critical care time)  CRITICAL CARE Performed by: Recardo Evangelist   Total critical care time: 35 minutes  Critical care time was exclusive of separately billable procedures and treating other patients.  Critical care was necessary to treat or prevent imminent or life-threatening deterioration.  Critical care was time spent personally by me on the following activities: development of treatment plan with patient and/or surrogate as well as nursing, discussions with consultants, evaluation of patient's response to treatment, examination of patient, obtaining history from patient or surrogate, ordering and performing treatments and interventions, ordering and review of laboratory studies, ordering and review of radiographic studies, pulse oximetry and re-evaluation of patient's condition.   Medications Ordered in ED Medications  0.9 %  sodium chloride infusion (has no administration in time range)  sodium chloride 0.9 % bolus 1,000 mL (1,000 mLs Intravenous New Bag/Given 07/05/18 1247)  pantoprazole (PROTONIX) injection 40 mg (40 mg Intravenous Given 07/05/18 1318)     Initial Impression / Assessment and Plan / ED Course  I have reviewed the triage vital signs and the nursing notes.  Pertinent labs & imaging results that were available during my care of the patient were reviewed by me and considered in my medical decision making (see chart for details).  63 year old male with history of gastric bypass and history of GI bleed presents with acute GI bleed.  Blood pressures are soft here but otherwise vital signs are normal.  Exam is remarkable for pale appearance.  Heart is regular rate and rhythm.  Lungs are clear to auscultation.  Abdomen is soft and nontender.  Rectal exam is remarkable for maroon-colored blood with digital exam.  CBC is remarkable for hemoglobin of 7.2.  Previous hemoglobin  was 10.8 on February 18.  CMP is overall unremarkable other than BUN being mildly elevated.  Unclear if this is more upper or lower GI bleed.  Discussed with [facility].  Will give IV Protonix and 1 unit of blood at this time.  They will come to see the patient.  Discussed with Dr. Sherral Hammers with [facility] who will come to admit the patient.  Final Clinical Impressions(s) / ED Diagnoses   Final diagnoses:  Acute GI bleeding    ED Discharge Orders    None       Recardo Evangelist, PA-C 07/05/18 1419    Blanchie Dessert, MD 07/05/18 2049

## 2018-07-05 NOTE — H&P (Signed)
[facility] History and Physical  Brent [name] WPY:[id number] DOB: [birth year] DOA: 07/05/2018  Referring physician:  PCP: [name], MD   Chief Complaint: GI bleed  HPI: Brent [name] is a 63 y.o. WM PMHx morbid obesity s/p Roux and Y Gastric bypass 03/2011, CAD, HTN, HLD, diabetes type 2, OSA on CPAP, nephrolithiasis, morbid obesity, diverticulosis, colonic polyps, GI bleed  He noticed his Hg level on My chart showed a gradual decrease 12 down to 10.8 over the past year. He was concerned taking Pantoprazole 40mg  bid chronically caused the decrease in his Hg level. He contacted his PCP regarding long term PPI use and he was advised to continue Pantoprazole 40mg  in the am and to stop the PM dose of Pantoprazole and to take Pepcid 40mg  at bed time which he started 4 weeks ago. No heartburn, dysphagia or stomach pain. He reports taking [employer] Powder 1 packet daily for the past 1 to 2 years due to arthritis and left shoulder pain. He had worse left shoulder pain so for the past month he has taken [employer] 1 to 3 packets daily. He takes Tylenol intermittently. He also takes Tramadol 1 tab 5 x daily and Hydrocodone 1 tab daily for arthritis and left shoulder pain. He awakened this morning and saw a moderate amount of red blood on top of a soft to loose brown stool with associated right mid abdominal pain. No nausea or vomiting. He was concerned he was having another GI bleed. He stated his right mid abdominal pain is similar to the pain he had when he suffered from a GI bleed in 2015, but the blood per the rectum then was purple and black. He drinks 3 to 4 beers every evening. He has not eaten solid food for 48 hours. His wife is present.      Review of Systems:  Constitutional:  No weight loss, night sweats, Fevers, chills, fatigue.  HEENT:  No headaches, Difficulty swallowing,Tooth/dental problems,Sore throat,  No sneezing, itching, ear ache, nasal congestion, post nasal drip,   Cardio-vascular:  No chest pain, Orthopnea, PND, swelling in lower extremities, anasarca, dizziness, palpitations  GI:  No heartburn, indigestion, abdominal pain, nausea, vomiting, diarrhea, change in bowel habits, loss of appetite  Resp:  No shortness of breath with exertion or at rest. No excess mucus, no productive cough, No non-productive cough, No coughing up of blood.No change in color of mucus.No wheezing.No chest wall deformity  Skin:  no rash or lesions.  GU:  no dysuria, change in color of urine, no urgency or frequency. No flank pain.  Musculoskeletal:  No joint pain or swelling. No decreased range of motion. No back pain.  Psych:  No change in mood or affect. No depression or anxiety. No memory loss.   Past Medical History:  Diagnosis Date  . Acid reflux disease   . ACID REFLUX DISEASE 07/05/2007  . Anemia   . Atherosclerosis   . Benign neoplasm of colon 07/05/2007  . Bilateral hip pain 10/05/2016  . Breast pain, left 11/17/2011  . Chest pain    a. Reportedly negative dobut echo performed prior to gastric bypass in 03/2011;  b. 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 12/2011 Cath: LM nl, LAD 50p, D1 32m, LCX min irregs, OM3 30, RCA 25p, 20m (FFR 0.99->0.89), PDA 30, EF 65%, Med Rx.  . COLONIC POLYPS, HX OF 10/29/2008  . Diarrhea 06/13/2010  . Diverticulosis 07/05/2018  . Diverticulosis of colon   . DIVERTICULOSIS,  COLON 10/29/2008  . DM (diabetes mellitus), type 2, uncontrolled ([place])   . GI bleed   . Gout 03/04/2013  . Hearing loss 05/24/2013   Previous audiology evaluation completely.  [name] HTN (hypertension) 07/14/2010  . Hx of colonic polyps   . HYPERSOMNIA, ASSOCIATED WITH SLEEP APNEA 07/26/2008  . Hypertension 07/14/2010  . 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 Knee pain, left 10/10/2010  . Morbid obesity ([place]) 03/27/2010   a. 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  [name] Neck pain 03/2015  . Other and unspecified hyperlipidemia 11/16/2012  . Preventative health care 11/17/2011  . Renal  stone 11/2013  . Sleep apnea    a. CPAP  . Spinal stenosis   . Tear of meniscus of left knee 2012   Past Surgical History:  Procedure Laterality Date  . ANKLE SURGERY  1994  . CARDIAC CATHETERIZATION     denies any chest pain in the past 2 years  . COLONOSCOPY    . colonoscopy polyps    . ESOPHAGOGASTRODUODENOSCOPY N/A 03/27/2013   Procedure: ESOPHAGOGASTRODUODENOSCOPY (EGD);  Surgeon: Irene Shipper, MD;  Location: [facility];  Service: Endoscopy;  Laterality: N/A;  . GASTRIC BYPASS    . HERNIA REPAIR    . KNEE ARTHROSCOPY  11/06/10   Left, torn meniscus (repaired)  . LEFT HEART CATHETERIZATION WITH CORONARY ANGIOGRAM N/A 12/23/2011   Procedure: LEFT HEART CATHETERIZATION WITH CORONARY ANGIOGRAM;  Surgeon: Minus Breeding, MD;  Location: [facility] CATH LAB;  Service: Cardiovascular;  Laterality: N/A;  . right knee arthroscopy Right 07/05/14   Dr. Hart Robinsons, [place].  . TONSILLECTOMY  age 56  . TOTAL KNEE ARTHROPLASTY Right 01/20/2016   Procedure: RIGHT TOTAL KNEE ARTHROPLASTY;  Surgeon: Paralee Cancel, MD;  Location: [facility];  Service: Orthopedics;  Laterality: Right;  . UPPER GI ENDOSCOPY  03/27/13   Social History:  reports that he quit smoking about 27 years ago. His smoking use included cigarettes. He has a 30.00 pack-year smoking history. He has never used smokeless tobacco. He reports current alcohol use of about 3.0 standard drinks of alcohol per week. He reports that he does not use drugs.  Allergies  Allergen Reactions  . Bee Venom Anaphylaxis  . Lipitor [Atorvastatin]     Myalgias, memory changes  . Morphine     hyperactive  . Hydrocodone Itching  . Pravastatin Other (See Comments)    Made joints hurt. --also simvastatin     Family History  Problem Relation Age of Onset  . Diabetes Mother   . Hypertension Mother   . Stroke Mother   . Hyperlipidemia Mother   . Hypertension Father   . Colon polyps Father   . Heart attack Father 34  . Stroke Father   . Heart attack  Brother   . Diabetes Brother   . Heart disease Brother   . Heart attack Brother        Multiple  . Diabetes Brother   . Other Brother        heart problems  . Heart disease Brother   . Diabetes Sister   . Obesity Brother   . Diabetes Brother   . Heart disease Brother   . Hypertension Maternal Grandmother   . ADD / ADHD Daughter   . Heart disease Brother   . Stomach cancer Neg Hx   . Colon cancer Neg Hx   . Esophageal cancer Neg Hx   . Rectal cancer Neg Hx     Prior to Admission medications  Medication Sig Start Date End Date Taking? Authorizing Provider  Aspirin-Acetaminophen-Caffeine (GOODY HEADACHE PO) Take 1 packet by mouth as needed (headache).   Yes [provider]  Cyanocobalamin (VITAMIN B-12) 1000 MCG SUBL Place 1 each under the tongue every morning.    Yes [provider]  EPIPEN 2-PAK 0.3 MG/0.3ML SOAJ injection Inject 0.3 mg into the skin once. 04/12/18  Yes [provider]  famotidine (PEPCID) 40 MG tablet Take 1 tablet (40 mg total) by mouth at bedtime as needed for heartburn or indigestion. Patient taking differently: Take 40 mg by mouth daily.  06/14/18  Yes [name], MD  fenofibrate 160 MG tablet Take 1 tablet (160 mg total) by mouth daily. 01/10/18  Yes [name], MD  gabapentin (NEURONTIN) 300 MG capsule Take 1 capsule (300 mg total) by mouth at bedtime. 06/07/18  Yes [name], MD  HYDROcodone-acetaminophen (NORCO) 5-325 MG tablet Take 1 tablet by mouth daily. Patient taking differently: Take 1 tablet by mouth daily as needed for moderate pain.  05/17/18  Yes [name], MD  Krill Oil 1000 MG CAPS Take 1 tablet by mouth 2 (two) times daily.    Yes [provider]  LIVALO 1 MG TABS TAKE 1 TABLET DAILY Patient taking differently: Take 1 mg by mouth daily.  05/02/18  Yes [name], MD  Magnesium 100 MG CAPS Take 1 capsule (100 mg total) by mouth daily. 06/29/17  Yes [name], MD  metFORMIN  (GLUCOPHAGE) 500 MG tablet TAKE 1 TABLET FOUR TIMES A DAY Patient taking differently: Take 500 mg by mouth 4 (four) times daily.  04/12/17  Yes [name], MD  methocarbamol (ROBAXIN) 500 MG tablet TAKE ONE TABLET BY MOUTH EVERY 6 HOURS AS NEEDED FOR MUSCLE SPASMS Patient taking differently: Take 500 mg by mouth 4 (four) times daily.  08/23/17  Yes [name], MD  metoprolol tartrate (LOPRESSOR) 25 MG tablet TAKE 1 TABLET TWICE A DAY Patient taking differently: Take 25 mg by mouth 2 (two) times daily.  06/28/18  Yes [name], MD  Multiple Vitamin (MULTIVITAMIN) tablet Take 1 tablet by mouth daily. BARIATRIC VIT   Yes [provider]  oxymetazoline (AFRIN) 0.05 % nasal spray Place 1 spray into both nostrils 2 (two) times daily as needed for congestion.   Yes [provider]  pantoprazole (PROTONIX) 40 MG tablet Take 1 tablet (40 mg total) by mouth 2 (two) times daily before a meal. 01/10/18  Yes [name], MD  traMADol (ULTRAM) 50 MG tablet Take 1 tablet po tid and 2 tablet po qhs Patient taking differently: Take 50-100 mg by mouth 4 (four) times daily. 50mg  tid then 100mg  at bedtime 06/17/18  Yes [name], MD  Vitamin D, Ergocalciferol, (DRISDOL) 1.25 MG (50000 UT) CAPS capsule TAKE 1 CAPSULE EVERY 7 DAYS Patient taking differently: Take 50,000 Units by mouth every 7 (seven) days.  06/15/18  Yes [name], MD  VITAMIN E PO Take 1 tablet by mouth daily.   Yes [provider]  glucose blood test strip Use as directed once daily to check blood sugar.  Diagnosis code E11.9 12/14/16   [name], MD  mometasone (ELOCON) 0.1 % cream Apply 1 application topically daily. Patient not taking: Reported on 07/05/2018 10/28/16   [name], MD  ramipril (ALTACE) 10 MG tablet Take 1 tablet (10 mg total) by mouth daily. 2 tabs po daily 03/02/11 07/14/11  [name], MD     Consultants:  Dr. Gerrit Heck [facility]     Procedures/Significant Events:  3/17 transfused 2 units PRBC 3/17 CTA abdomen and pelvis W/Wo contrast:No evidence of active gastrointestinal bleeding by CTA during arterial and delayed venous phases of imaging. No vascular abnormalities are identified by CTA.    I have personally reviewed and interpreted all radiology studies and my findings are as above.   VENTILATOR SETTINGS: None   Cultures None  Antimicrobials: None   Devices None   LINES / TUBES:  None    Continuous Infusions: . sodium chloride      Physical Exam: Vitals:   07/05/18 1300 07/05/18 1330 07/05/18 1345 07/05/18 1346  BP: 109/66 130/69 121/77   Pulse: 77 77 77   Resp:  18  18  Temp:    99.1 F (37.3 C)  TempSrc:    Oral  SpO2: 100% 100% 100%   Weight:      Height:        Wt Readings from Last 3 Encounters:  07/05/18 106.6 kg  06/07/18 114.1 kg  03/01/18 111.6 kg    General: No acute respiratory distress Eyes: negative scleral hemorrhage, negative anisocoria, negative icterus ENT: Negative Runny nose, negative gingival bleeding, Neck:  Negative scars, masses, torticollis, lymphadenopathy, JVD Lungs: Clear to auscultation bilaterally without wheezes or crackles Cardiovascular: Regular rate and rhythm without murmur gallop or rub normal S1 and S2 Abdomen: negative abdominal pain, nondistended, positive soft, bowel sounds, no rebound, no ascites, no appreciable mass Extremities: No significant cyanosis, clubbing, or edema bilateral lower extremities Skin: Negative rashes, lesions, ulcers Psychiatric:  Negative depression, negative anxiety, negative fatigue, negative mania  Central nervous system:  Cranial nerves II through XII intact, tongue/uvula midline, all extremities muscle strength 5/5, sensation intact throughout, negative dysarthria, negative expressive aphasia, negative receptive aphasia.        Labs on Admission:  Basic Metabolic Panel: Recent Labs  Lab 07/05/18 1156  NA  138  K 4.3  CL 107  CO2 24  GLUCOSE 165*  BUN 24*  CREATININE 1.11  CALCIUM 8.5*   Liver Function Tests: Recent Labs  Lab 07/05/18 1156  AST 21  ALT 16  ALKPHOS 28*  BILITOT 0.7  PROT 6.0*  ALBUMIN 3.6   No results for input(s): LIPASE, AMYLASE in the last 168 hours. No results for input(s): AMMONIA in the last 168 hours. CBC: Recent Labs  Lab 07/05/18 1156  WBC 6.0  HGB 7.2*  HCT 24.6*  MCV 86.9  PLT 224   Cardiac Enzymes: No results for input(s): CKTOTAL, CKMB, CKMBINDEX, TROPONINI in the last 168 hours.  BNP (last 3 results) No results for input(s): BNP in the last 8760 hours.  ProBNP (last 3 results) No results for input(s): PROBNP in the last 8760 hours.  CBG: Recent Labs  Lab 07/05/18 1056  GLUCAP 137*    Radiological Exams on Admission: No results found.  EKG: Independently reviewed.   Assessment/Plan Active Problems:   T2DM (type 2 diabetes mellitus) ([place])   Morbid obesity (HCC)   Essential hypertension   CAD (coronary artery disease)   Obstructive sleep apnea on CPAP   Hyperlipidemia, mixed   History of Roux-en-Y gastric bypass   Hx of adenomatous colonic polyps   Gastroesophageal reflux disease   Nephrolithiasis   Insomnia   Diverticulosis  Symptomatic GI bleed/Diverticulosis - Patient orthostatic hypotension, continued GI bleed transfuse 2 units PRBC - CTA abdomen and pelvis unremarkable for source  of GI bleed.  Will await further recommendations from [place] - H/H every 4 hour - Hemoglobin goal> 7  CAD - Patient's GI bleed will hold all BP medication - Monitor closely.  HTN -See CAD  OSA - CPAP nightly and when patient sleeping during the daytime  Diabetes type 2 controlled without complication - 2/71 hemoglobin A1c= 6.9 - Hold metformin - Moderate SSI     Code Status: Full (DVT Prophylaxis: SCD Family Communication: Wife at bedside (wife is an Therapist, sports) Disposition Plan: Per GI    Data Reviewed: Care during  the described time interval was provided by me .  I have reviewed this patient's available data, including medical history, events of note, physical examination, and all test results as part of my evaluation.   Time spent: 60 min  [place], [facility] Pager [phone]

## 2018-07-05 NOTE — ED Notes (Signed)
Critical hgb: 6.0

## 2018-07-05 NOTE — ED Notes (Signed)
Bed: WA05 Expected date:  Expected time:  Means of arrival:  Comments: 

## 2018-07-05 NOTE — ED Notes (Signed)
While taking orthostatic vitals pt became light headed when going from lying to sitting. Pt then became increasing light headed and dizzy during standing vital.  pt had to sit back down as Bp was being read. Unable to complete standing vitals after 3 mins due to pt not tolerating standing.

## 2018-07-05 NOTE — Telephone Encounter (Signed)
Pt reports he has had a GI bleed this morning and his BP has dropped. Pt instructed to go to the ER, he verbalized understanding.

## 2018-07-06 ENCOUNTER — Inpatient Hospital Stay (HOSPITAL_COMMUNITY): Admitting: Anesthesiology

## 2018-07-06 ENCOUNTER — Encounter (HOSPITAL_COMMUNITY): Payer: Self-pay | Admitting: Internal Medicine

## 2018-07-06 ENCOUNTER — Encounter (HOSPITAL_COMMUNITY)
Admission: EM | Disposition: A | Discharge status: Home / Self Care | Payer: Self-pay | Source: Home / Self Care | Attending: Internal Medicine

## 2018-07-06 DIAGNOSIS — N200 Calculus of kidney: Secondary | ICD-10-CM

## 2018-07-06 DIAGNOSIS — K254 Chronic or unspecified gastric ulcer with hemorrhage: Principal | ICD-10-CM

## 2018-07-06 DIAGNOSIS — Z9884 Bariatric surgery status: Secondary | ICD-10-CM

## 2018-07-06 DIAGNOSIS — D62 Acute posthemorrhagic anemia: Secondary | ICD-10-CM

## 2018-07-06 DIAGNOSIS — K921 Melena: Secondary | ICD-10-CM

## 2018-07-06 DIAGNOSIS — Z980 Intestinal bypass and anastomosis status: Secondary | ICD-10-CM

## 2018-07-06 DIAGNOSIS — K289 Gastrojejunal ulcer, unspecified as acute or chronic, without hemorrhage or perforation: Secondary | ICD-10-CM

## 2018-07-06 DIAGNOSIS — K219 Gastro-esophageal reflux disease without esophagitis: Secondary | ICD-10-CM

## 2018-07-06 DIAGNOSIS — E119 Type 2 diabetes mellitus without complications: Secondary | ICD-10-CM

## 2018-07-06 HISTORY — PX: HOT HEMOSTASIS: SHX5433

## 2018-07-06 HISTORY — PX: SCHLEROTHERAPY: SHX5440

## 2018-07-06 HISTORY — PX: ESOPHAGOGASTRODUODENOSCOPY (EGD) WITH PROPOFOL: SHX5813

## 2018-07-06 LAB — BASIC METABOLIC PANEL
Anion gap: 6 (ref 5–15)
BUN: 19 mg/dL (ref 8–23)
CO2: 22 mmol/L (ref 22–32)
Calcium: 8 mg/dL — ABNORMAL LOW (ref 8.9–10.3)
Chloride: 114 mmol/L — ABNORMAL HIGH (ref 98–111)
Creatinine, Ser: 0.98 mg/dL (ref 0.61–1.24)
GFR calc Af Amer: >60 mL/min (ref >60)
GFR calc non Af Amer: >60 mL/min (ref >60)
Glucose, Bld: 153 mg/dL — ABNORMAL HIGH (ref 70–99)
Potassium: 4.4 mmol/L (ref 3.5–5.1)
Sodium: 142 mmol/L (ref 135–145)

## 2018-07-06 LAB — GLUCOSE, CAPILLARY
Glucose-Capillary: 104 mg/dL — ABNORMAL HIGH (ref 70–99)
Glucose-Capillary: 148 mg/dL — ABNORMAL HIGH (ref 70–99)
Glucose-Capillary: 139 mg/dL — ABNORMAL HIGH (ref 70–99)
Glucose-Capillary: 130 mg/dL — ABNORMAL HIGH (ref 70–99)
Glucose-Capillary: 121 mg/dL — ABNORMAL HIGH (ref 70–99)
Glucose-Capillary: 109 mg/dL — ABNORMAL HIGH (ref 70–99)

## 2018-07-06 LAB — PREPARE RBC (CROSSMATCH)

## 2018-07-06 LAB — HEMOGLOBIN AND HEMATOCRIT, BLOOD
HCT: 19.6 % — ABNORMAL LOW (ref 39.0–52.0)
HCT: 23.7 % — ABNORMAL LOW (ref 39.0–52.0)
HCT: 27 % — ABNORMAL LOW (ref 39.0–52.0)
Hemoglobin: 6.2 g/dL — CL (ref 13.0–17.0)
Hemoglobin: 7.4 g/dL — ABNORMAL LOW (ref 13.0–17.0)
Hemoglobin: 8.4 g/dL — ABNORMAL LOW (ref 13.0–17.0)

## 2018-07-06 LAB — MAGNESIUM: Magnesium: 1.5 mg/dL — ABNORMAL LOW (ref 1.7–2.4)

## 2018-07-06 LAB — PROTIME-INR
INR: 1.3 — ABNORMAL HIGH (ref 0.8–1.2)
Prothrombin Time: 16.3 seconds — ABNORMAL HIGH (ref 11.4–15.2)

## 2018-07-06 LAB — HIV ANTIBODY (ROUTINE TESTING W REFLEX): HIV Screen 4th Generation wRfx: NONREACTIVE

## 2018-07-06 LAB — APTT: aPTT: 31 seconds (ref 24–36)

## 2018-07-06 SURGERY — ESOPHAGOGASTRODUODENOSCOPY (EGD) WITH PROPOFOL
Anesthesia: Monitor Anesthesia Care

## 2018-07-06 MED ORDER — ADULT MULTIVITAMIN W/MINERALS CH
1.0000 | ORAL_TABLET | Freq: Every day | ORAL | Status: DC
  Administered 2018-07-06 – 2018-07-08 (×3): 1 via ORAL
  Filled 2018-07-06 (×3): qty 1

## 2018-07-06 MED ORDER — FOLIC ACID 1 MG PO TABS
1.0000 mg | ORAL_TABLET | Freq: Every day | ORAL | Status: DC
  Administered 2018-07-06 – 2018-07-08 (×3): 1 mg via ORAL
  Filled 2018-07-06 (×3): qty 1

## 2018-07-06 MED ORDER — ACETAMINOPHEN 500 MG PO TABS
1000.0000 mg | ORAL_TABLET | Freq: Four times a day (QID) | ORAL | Status: DC | PRN
  Administered 2018-07-06 – 2018-07-07 (×3): 1000 mg via ORAL
  Filled 2018-07-06 (×3): qty 2

## 2018-07-06 MED ORDER — ONDANSETRON HCL 4 MG/2ML IJ SOLN
INTRAMUSCULAR | Status: AC
  Filled 2018-07-06: qty 2

## 2018-07-06 MED ORDER — SODIUM CHLORIDE 0.9% IV SOLUTION
Freq: Once | INTRAVENOUS | Status: AC
  Administered 2018-07-06: 02:00:00 via INTRAVENOUS

## 2018-07-06 MED ORDER — PANTOPRAZOLE SODIUM 40 MG IV SOLR: 40.0000 mg | Freq: Two times a day (BID) | INTRAVENOUS | Status: DC

## 2018-07-06 MED ORDER — SODIUM CHLORIDE 0.9 % IV SOLN
80.0000 mg | Freq: Once | INTRAVENOUS | Status: AC
  Administered 2018-07-06: 80 mg via INTRAVENOUS
  Filled 2018-07-06: qty 80

## 2018-07-06 MED ORDER — SODIUM CHLORIDE 0.9 % IV SOLN
8.0000 mg/h | INTRAVENOUS | Status: DC
  Administered 2018-07-06 – 2018-07-08 (×5): 8 mg/h via INTRAVENOUS
  Filled 2018-07-06 (×7): qty 80

## 2018-07-06 MED ORDER — LORAZEPAM 2 MG/ML IJ SOLN: 1.0000 mg | Freq: Four times a day (QID) | INTRAMUSCULAR | Status: DC | PRN

## 2018-07-06 MED ORDER — LACTATED RINGERS IV SOLN
INTRAVENOUS | Status: DC
  Administered 2018-07-06: 1000 mL via INTRAVENOUS

## 2018-07-06 MED ORDER — SODIUM CHLORIDE (PF) 0.9 % IJ SOLN
PREFILLED_SYRINGE | INTRAMUSCULAR | Status: DC | PRN
  Administered 2018-07-06: 2.5 mL

## 2018-07-06 MED ORDER — PROPOFOL 500 MG/50ML IV EMUL
INTRAVENOUS | Status: DC | PRN
  Administered 2018-07-06: 125 ug/kg/min via INTRAVENOUS

## 2018-07-06 MED ORDER — SODIUM CHLORIDE 0.9% IV SOLUTION
Freq: Once | INTRAVENOUS | Status: AC
  Administered 2018-07-06: 12:00:00 via INTRAVENOUS

## 2018-07-06 MED ORDER — EPINEPHRINE PF 1 MG/10ML IJ SOSY
PREFILLED_SYRINGE | INTRAMUSCULAR | Status: AC
  Filled 2018-07-06: qty 10

## 2018-07-06 MED ORDER — VITAMIN B-1 100 MG PO TABS
100.0000 mg | ORAL_TABLET | Freq: Every day | ORAL | Status: DC
  Administered 2018-07-06 – 2018-07-08 (×3): 100 mg via ORAL
  Filled 2018-07-06 (×3): qty 1

## 2018-07-06 MED ORDER — SALINE SPRAY 0.65 % NA SOLN
1.0000 | NASAL | Status: DC | PRN
  Administered 2018-07-06: 1 via NASAL
  Filled 2018-07-06: qty 44

## 2018-07-06 MED ORDER — PROPOFOL 10 MG/ML IV BOLUS
INTRAVENOUS | Status: DC | PRN
  Administered 2018-07-06 (×2): 50 mg via INTRAVENOUS

## 2018-07-06 MED ORDER — LIDOCAINE 2% (20 MG/ML) 5 ML SYRINGE
INTRAMUSCULAR | Status: DC | PRN
  Administered 2018-07-06: 100 mg via INTRAVENOUS

## 2018-07-06 MED ORDER — PROPOFOL 10 MG/ML IV BOLUS
INTRAVENOUS | Status: AC
  Filled 2018-07-06: qty 20

## 2018-07-06 MED ORDER — ESMOLOL HCL 100 MG/10ML IV SOLN
INTRAVENOUS | Status: DC | PRN
  Administered 2018-07-06: 40 mg via INTRAVENOUS

## 2018-07-06 MED ORDER — ONDANSETRON HCL 4 MG/2ML IJ SOLN
4.0000 mg | Freq: Once | INTRAMUSCULAR | Status: AC
  Administered 2018-07-06: 4 mg via INTRAVENOUS

## 2018-07-06 MED ORDER — LORAZEPAM 1 MG PO TABS
1.0000 mg | ORAL_TABLET | Freq: Four times a day (QID) | ORAL | Status: DC | PRN
  Administered 2018-07-06: 1 mg via ORAL
  Filled 2018-07-06: qty 1

## 2018-07-06 MED ORDER — THIAMINE HCL 100 MG/ML IJ SOLN: 100.0000 mg | Freq: Every day | INTRAMUSCULAR | Status: DC

## 2018-07-06 SURGICAL SUPPLY — 25 items

## 2018-07-06 NOTE — H&P (View-Only) (Signed)
Patient ID: Brent [name], male   DOB: 1955-09-01, 63 y.o.   MRN: [id number]  Brief GI update: Hg in ED 7.2 << 6.0 -6.2.  PT received 2 units of PRBCs 3/17 and additional 2 units PRBCs 3/18. Post transfusion H/H at 8:45am today was 7.4. One additional unit of PRBCs ordered by Dr. Benny Lennert which will be started prior to his EGD/colonoscopy scheduled at 2pm today. Pt stated he tolerated the Movi prep. Last noticeable maroon stool was at 5am, then passed yellow green water from the rectum later this morning. He denies having any further dizziness. No chest pain or SOB. Remains hemodynamically stable. Pt to proceed with EGD/colonoscopy as scheduled with Dr. Hilarie Fredrickson.

## 2018-07-06 NOTE — Progress Notes (Signed)
PROGRESS NOTE  Brent [name] ZOX:[id number] DOB: 09/12/1955 DOA: 07/05/2018 PCP: [name], MD  Brief History   Brent [name] is a 63 y.o. WM PMHx morbid obesity s/p Roux and Y Gastric bypass 03/2011, CAD, HTN, HLD, diabetes type 2, OSA on CPAP, nephrolithiasis, morbid obesity, diverticulosis, colonic polyps, GI bleed  He noticed his Hg level on My chart showed a gradual decrease 12 down to 10.8 over the past year. He was concerned taking Pantoprazole 40mg  bid chronically caused the decrease in his Hg level. He contacted his PCP regarding long term PPI use and he was advised to continue Pantoprazole 40mg  in the am and to stop the PM dose of Pantoprazole and to take Pepcid 40mg  at bed time which he started 4 weeks ago. No heartburn, dysphagia or stomach pain. He reports taking [employer] Powder 1 packet daily for the past 1 to 2 years due to arthritis and left shoulder pain. He had worse left shoulder pain so for the past month he has taken [employer] 1 to 3 packets daily. He takes Tylenol intermittently. He also takes Tramadol 1 tab 5 x daily and Hydrocodone 1 tab daily for arthritis and left shoulder pain. He awakened this morning and saw a moderate amount of red blood on top of a soft to loose brown stool with associated right mid abdominal pain. No nausea or vomiting. He was concerned he was having another GI bleed. He stated his right mid abdominal pain is similar to the pain he had when he suffered from a GI bleed in 2015, but the blood per the rectum then was purple and black. He drinks at least 1/2 a liter of alcohol a day. His last drink was 07/04/2018.  The patient received a total of 5 units of PRBC's to keep his hemoglobin greater than 8.0. He felt that his bleeding was slowing down this morning as he had not seen anything but brown stool with the bowel prep. The patient underwent EGD and colonoscopy today. Ulceration was located at the G-J junction of the patient's prior Roux-en-Y. This  was injected with epinephrine and then coagulated for hemostasis. In the jejunum a marginal ulcer with a visible vessel was noted and injected as well.   The patient was returned on a clear liquid diet and orders for 48 hours of IV protonix. He is to avoid NSAID, and hemoglobin monitored. He will be offered alcohol rehab as outpatient. [place] Recent Vital Signs   BP (!) 152/86   Pulse 98   Temp 98.4 F (36.9 C) (Oral)   Resp 18   Ht 5\' 8"  (1.727 m)   Wt 106.6 kg   SpO2 100%   BMI 35.73 kg/m    Past Medical History:  Diagnosis Date  . Acid reflux disease   . ACID REFLUX DISEASE 07/05/2007  . Anemia   . Atherosclerosis   . Benign neoplasm of colon 07/05/2007  . Bilateral hip pain 10/05/2016  . Breast pain, left 11/17/2011  . Chest pain    a. Reportedly negative dobut echo performed prior to gastric bypass in 03/2011;  b. 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 12/2011 Cath: LM nl, LAD 50p, D1 68m, LCX min irregs, OM3 30, RCA 25p, 57m (FFR 0.99->0.89), PDA 30, EF 65%, Med Rx.  . COLONIC POLYPS, HX OF 10/29/2008  . Diarrhea 06/13/2010  . Diverticulosis 07/05/2018  . Diverticulosis of colon   . DIVERTICULOSIS, COLON 10/29/2008  . DM (diabetes mellitus), type  2, uncontrolled ([place])   . GI bleed   . Gout 03/04/2013  . Hearing loss 05/24/2013   Previous audiology evaluation completely.  [name] HTN (hypertension) 07/14/2010  . Hx of colonic polyps   . HYPERSOMNIA, ASSOCIATED WITH SLEEP APNEA 07/26/2008  . Hypertension 07/14/2010  . 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 Knee pain, left 10/10/2010  . Morbid obesity ([place]) 03/27/2010   a. 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  [name] Neck pain 03/2015  . Other and unspecified hyperlipidemia 11/16/2012  . Preventative health care 11/17/2011  . Renal stone 11/2013  . Sleep apnea    a. CPAP  . Spinal stenosis   . Tear of meniscus of left knee 2012     Expected Discharge Date  (unknown)  Diet Order            Diet clear liquid Room service appropriate?  Yes; Fluid consistency: Thin  Diet effective now               VTE Documentation  Sequential compression devices, below knee   Work Intensity Score/Level of Care  2  @LEVELOFCARE @   Mobility  Head of Bed Elevated : Self regulated Activity: Ambulated in room, Ambulated to bathroom Range of Motion: Active Level of Assistance: Standby assist, set-up cues, supervision of patient - no hands on Mobility Response: Tolerated well Transport method: Buyer, retail Orders  (From admission, onward)         Start     Ordered   07/05/18 1330  Consult to hospitalist  Once    Provider:  Allie Bossier, MD  Question Answer Comment  Place call to: [facility]   Reason for Consult Admit      07/05/18 1329           Significant Events    DC Barriers   Abnormal Labs:  Brent [name] 07/06/2018, 5:16 PM . He has not eaten solid food for 48 hours. His wife is present.   Consultants  . Gastroenterology  Procedures  . EGD and Colonoscopy  Antibiotics  . None  Interval History/Subjective  See above.  Objective   Vitals:  Vitals:   07/06/18 1610 07/06/18 1625  BP: (!) 168/100 (!) 152/86  Pulse: 99 98  Resp: 14 18  Temp:  98.4 F (36.9 C)  SpO2: 100% 100%    Exam:  Constitutional:  . Awake, alert, and oriented x 3.  Respiratory:  . CTA bilaterally, no wheezes, rales, or rhonchi are appreciated. . No increased work of breathing.  . No tactile fremitus. Cardiovascular:  . RRR, no murmurs, ectopy, or gallups are appreciated. . No LE extremity edema   . No lateral PMI. No thrills. Abdomen:  . Abdomen is soft, non-tender, non-distended.  . No hernias, masses, or organomegaly is appreciated . Normoactive bowel sounds.  Musculoskeletal:  . No cyanosis, clubbing or edema. Skin:  . No rashes, lesions, ulcers . palpation of skin: no induration or nodules Neurologic:  . CN 2-12 intact . Sensation all 4 extremities intact Psychiatric:  .  Mental status o Mood, affect appropriate o Orientation to person, place, time  . judgment and insight appear intact    I have personally reviewed the following:   Today's Data  . Hemoglobin/hematocrit. Vitals  Scheduled Meds: . sodium chloride   Intravenous Once  . folic acid  1 mg Oral Daily  . insulin aspart  0-15 Units Subcutaneous Q4H  . multivitamin with minerals  1 tablet Oral Daily  . [  START ON 07/10/2018] pantoprazole  40 mg Intravenous Q12H  . thiamine  100 mg Oral Daily   Or  . thiamine  100 mg Intravenous Daily   Continuous Infusions: . sodium chloride 75 mL/hr at 07/06/18 1645  . pantoprazole (PROTONIX) IVPB    . pantoprozole (PROTONIX) infusion      Active Problems:   T2DM (type 2 diabetes mellitus) ([place])   Morbid obesity (HCC)   Essential hypertension   CAD (coronary artery disease)   Obstructive sleep apnea on CPAP   Hyperlipidemia, mixed   History of Roux-en-Y gastric bypass   Hx of adenomatous colonic polyps   Acute GI bleeding   Gastroesophageal reflux disease   Nephrolithiasis   Insomnia   Diverticulosis   GI bleed   Hematochezia   Acute blood loss anemia   Marginal ulcer  A & P  Acute blood loss anemia: The patient has required 5 units of PRBC's to keep his hemoglobin greater than 8.0 as recommended by GI. Monitor H&H.   GI Bleed secondary to ulceration at the [place] junction of the patient's Roux-En-Y anastamosis and a marginal ulcer in the jejunum. Both were injected and the one at the anastamosis was coagulated. IV protonix x 48 hours and clear liquid diet. No NSAIDS.   ETOH abuse: The patient will be placed on CIWA protocol. He will be offered ETOH rehab on discharge as abstinence would reduce his risk of re-bleeding. Thiamine and folate is being supplemented.  DM II: FSBS is being followed with SSI. FSBS for the last 24 hours has been 104 - 148. Glucophage being held.  CAD: The patient is being monitored on telemetry. Continue fenofibrate,  livalo, and metoprolol. ASA contraindicated due to bleeding. Stable.  Essential Hypertension: Stable currently  GERD: PPI  I have seen and examined this patient myself. I have spent 38 minutes in his evaluation and care.  DVT prophylaxis: SCD's Code Status: Full Code Family Communication: No family available Disposition Plan: tbd  Brent [place], DO [facility] Direct contact: see [email or web address]  7PM-7AM contact night coverage as above 07/06/2018, 5:15 PM  LOS: 1 day    LOS: 1 day

## 2018-07-06 NOTE — Progress Notes (Signed)
Patient ID: Brent [name], male   DOB: 1955/09/04, 63 y.o.   MRN: [id number]  Brief GI update: Hg in ED 7.2 << 6.0 -6.2.  PT received 2 units of PRBCs 3/17 and additional 2 units PRBCs 3/18. Post transfusion H/H at 8:45am today was 7.4. One additional unit of PRBCs ordered by Dr. Benny Lennert which will be started prior to his EGD/colonoscopy scheduled at 2pm today. Pt stated he tolerated the Movi prep. Last noticeable maroon stool was at 5am, then passed yellow green water from the rectum later this morning. He denies having any further dizziness. No chest pain or SOB. Remains hemodynamically stable. Pt to proceed with EGD/colonoscopy as scheduled with Dr. Hilarie Fredrickson.

## 2018-07-06 NOTE — Transfer of Care (Signed)
Immediate Anesthesia Transfer of Care Note  Patient: Brent [name]  Procedure(s) Performed: ESOPHAGOGASTRODUODENOSCOPY (EGD) WITH PROPOFOL (N/A ) COLONOSCOPY WITH PROPOFOL (N/A ) SCHLEROTHERAPY HOT HEMOSTASIS (ARGON PLASMA COAGULATION/BICAP) (N/A )  Patient Location: PACU and Endoscopy Unit  Anesthesia Type:MAC  Level of Consciousness: awake, alert  and oriented  Airway & Oxygen Therapy: Patient Spontanous Breathing and Patient connected to nasal cannula oxygen  Post-op Assessment: Report given to RN and Post -op Vital signs reviewed and stable  Post vital signs: Reviewed and stable  Last Vitals:  Vitals Value Taken Time  BP    Temp    Pulse 102 07/06/2018  3:41 PM  Resp 22 07/06/2018  3:41 PM  SpO2 100 % 07/06/2018  3:41 PM  Vitals shown include unvalidated device data.  Last Pain:  Vitals:   07/06/18 1335  TempSrc: Oral  PainSc: 0-No pain      Patients Stated Pain Goal: 2 (08/67/61 [id number])  Complications: No apparent anesthesia complications

## 2018-07-06 NOTE — Anesthesia Preprocedure Evaluation (Addendum)
Anesthesia Evaluation  Patient identified by MRN, date of birth, ID band Patient awake    Reviewed: Allergy & Precautions, NPO status , Patient's Chart, lab work & pertinent test results  Airway Mallampati: II  TM Distance: >3 FB Neck ROM: Full    Dental no notable dental hx. (+) Teeth Intact   Pulmonary former smoker,    Pulmonary exam normal breath sounds clear to auscultation       Cardiovascular hypertension, Pt. on medications + CAD  Normal cardiovascular exam Rhythm:Regular Rate:Normal  10/29/17 EKG  SR R 99 w PVC   Neuro/Psych  Headaches, negative psych ROS   GI/Hepatic Neg liver ROS, GERD  ,S/P Roux & Y   Endo/Other  diabetes, Well Controlled, Type 2  Renal/GU K+ 4.4 Cr 0.98  negative genitourinary   Musculoskeletal   Abdominal (+) + obese,   Peds  Hematology  (+) Blood dyscrasia, anemia , Hgb 7.4   Anesthesia Other Findings   Reproductive/Obstetrics                           Anesthesia Physical Anesthesia Plan  ASA: III  Anesthesia Plan: MAC   Post-op Pain Management:    Induction: Intravenous  PONV Risk Score and Plan:   Airway Management Planned: Nasal Cannula and Natural Airway  Additional Equipment:   Intra-op Plan:   Post-operative Plan:   Informed Consent: I have reviewed the patients History and Physical, chart, labs and discussed the procedure including the risks, benefits and alternatives for the proposed anesthesia with the patient or authorized representative who has indicated his/her understanding and acceptance.     Dental advisory given  Plan Discussed with:   Anesthesia Plan Comments: (GI Bleed)        Anesthesia Quick Evaluation

## 2018-07-06 NOTE — Plan of Care (Signed)
  Problem: Education: Goal: Ability to identify signs and symptoms of gastrointestinal bleeding will improve Outcome: Completed/Met

## 2018-07-06 NOTE — Interval H&P Note (Signed)
History and Physical Interval Note: For EGD +/- colonoscopy today with MAC to evaluate GI bleeding.  Acute post-hemorrhagic anemia getting pRBCs VVS The nature of the procedure, as well as the risks, benefits, and alternatives were carefully and thoroughly reviewed with the patient. Ample time for discussion and questions allowed. The patient understood, was satisfied, and agreed to proceed.     07/06/2018 2:42 PM  Brent [name]  has presented today for surgery, with the diagnosis of Anemia, hematochezia, right mid abd pain.  The various methods of treatment have been discussed with the patient and family. After consideration of risks, benefits and other options for treatment, the patient has consented to  Procedure(s): ESOPHAGOGASTRODUODENOSCOPY (EGD) WITH PROPOFOL (N/A) COLONOSCOPY WITH PROPOFOL (N/A) as a surgical intervention.  The patient's history has been reviewed, patient examined, no change in status, stable for surgery.  I have reviewed the patient's chart and labs.  Questions were answered to the patient's satisfaction.     Brent [name]

## 2018-07-06 NOTE — Op Note (Signed)
[facility] Patient Name: Brent [name] Procedure Date: 07/06/2018 MRN: [id number] Attending MD: Jerene Bears , MD Date of Birth: Nov 27, 1955 CSN: [id number] Age: 63 Admit Type: Inpatient Procedure:                Upper GI endoscopy Indications:              Acute post hemorrhagic anemia, Hematochezia Providers:                Lajuan Lines. Hilarie Fredrickson, MD, Burtis Junes, RN, Tinnie Gens,                            Technician Referring MD:             [facility] Medicines:                Monitored Anesthesia Care Complications:            No immediate complications. Estimated Blood Loss:     Estimated blood loss was minimal. Procedure:                Pre-Anesthesia Assessment:                           - Prior to the procedure, a History and Physical                            was performed, and patient medications and                            allergies were reviewed. The patient's tolerance of                            previous anesthesia was also reviewed. The risks                            and benefits of the procedure and the sedation                            options and risks were discussed with the patient.                            All questions were answered, and informed consent                            was obtained. Prior Anticoagulants: The patient has                            taken no previous anticoagulant or antiplatelet                            agents. ASA Grade Assessment: II - A patient with                            mild systemic disease. After reviewing the risks  and benefits, the patient was deemed in                            satisfactory condition to undergo the procedure.                           After obtaining informed consent, the endoscope was                            passed under direct vision. Throughout the                            procedure, the patient's blood pressure, pulse, and                 oxygen saturations were monitored continuously. The                            GIF-H190 ([id number]) Olympus gastroscope was                            introduced through the mouth, and advanced to the                            efferent jejunal loop. The upper GI endoscopy was                            accomplished without difficulty. The patient                            tolerated the procedure well. Scope In: Scope Out: Findings:      The examined esophagus was normal.      Evidence of a Roux-en-Y gastrojejunostomy was found. The gastrojejunal       anastomosis was characterized by ulceration. This was traversed.      One non-bleeding cratered gastric ulcer with a visible vessel was found       at the gastrojejunal anastomosis. The lesion was 10 mm in largest       dimension. Area around the ulcer was successfully injected with 3 mL       total of a 1:10,000 solution of epinephrine for hemostasis. Coagulation       for hemostasis using monopolar Gold probe was successful.      The examined jejunum, efferent limb, was normal. Impression:               - Normal esophagus.                           - Roux-en-Y gastric bypass anatomy.                           - Marginal ulcer with a visible vessel. Injected.                            Treated with a monopolar probe.                           -  Normal examined jejunum.                           - No specimens collected. Moderate Sedation:      N/A Recommendation:           - Return patient to hospital ward for ongoing care.                           - Clear liquid diet.                           - Continue present medications.                           - Begin PPI infusion given endoscopy therapy                            performed today.                           - No NSAIDs.                           - Monitor Hgb.                           - We will follow. Procedure Code(s):        --- Professional ---                            [phone], Esophagogastroduodenoscopy, flexible,                            transoral; with control of bleeding, any method Diagnosis Code(s):        --- Professional ---                           Z98.0, Intestinal bypass and anastomosis status                           K25.4, Chronic or unspecified gastric ulcer with                            hemorrhage                           D62, Acute posthemorrhagic anemia                           K92.1, Melena (includes Hematochezia) CPT copyright 2018 American [employer] Association. All rights reserved. The codes documented in this report are preliminary and upon coder review may  be revised to meet current compliance requirements. Jerene Bears, MD 07/06/2018 3:42:16 PM This report has been signed electronically. Number of Addenda: 0

## 2018-07-06 NOTE — Progress Notes (Signed)
Called Dr. Benny Lennert to clarify order for 1 unit of blood to be transfused.  Order confirmed and pt is to receive one unit of blood. Stacey Drain

## 2018-07-06 NOTE — Progress Notes (Signed)
I spoke to the patient's wife, Helene Kelp, by phone after the procedure. I let her know of the marginal ulcer with visible vessel which was treated today during endoscopy He will need to remain in the hospital for PPI infusion and close monitoring in the setting of upper GI bleeding due to this ulcer  She shared her concern that he drinks more alcohol than he lets on.  She estimates he drinks about 0.5 L of liquor per night.  She is worried that he may have alcohol withdrawal while admitted.  She is also concerned about his depression and she feels that it has not been addressed.  The patient often denies this when he is confronted by primary care.  She is concerned that he may have some element of PTSD from [employer].  She reports he sleeps poorly.  I will alert hospitalist and primary care to her concern.  GI will follow

## 2018-07-06 NOTE — Anesthesia Postprocedure Evaluation (Signed)
Anesthesia Post Note  Patient: Brent [name]  Procedure(s) Performed: ESOPHAGOGASTRODUODENOSCOPY (EGD) WITH PROPOFOL (N/A ) SCHLEROTHERAPY HOT HEMOSTASIS (ARGON PLASMA COAGULATION/BICAP) (N/A )     Patient location during evaluation: PACU Anesthesia Type: MAC Level of consciousness: awake and alert Pain management: pain level controlled Vital Signs Assessment: post-procedure vital signs reviewed and stable Respiratory status: spontaneous breathing, nonlabored ventilation and respiratory function stable Cardiovascular status: stable and blood pressure returned to baseline Postop Assessment: no apparent nausea or vomiting Anesthetic complications: no    Last Vitals:  Vitals:   07/06/18 1540 07/06/18 1610  BP: (!) 161/77 (!) 168/100  Pulse: (!) 106 99  Resp: 16 14  Temp: 37 C   SpO2: 100% 100%    Last Pain:  Vitals:   07/06/18 1610  TempSrc:   PainSc: 0-No pain                 Lynda Rainwater

## 2018-07-07 DIAGNOSIS — K283 Acute gastrojejunal ulcer without hemorrhage or perforation: Secondary | ICD-10-CM

## 2018-07-07 LAB — GLUCOSE, CAPILLARY
Glucose-Capillary: 107 mg/dL — ABNORMAL HIGH (ref 70–99)
Glucose-Capillary: 121 mg/dL — ABNORMAL HIGH (ref 70–99)
Glucose-Capillary: 130 mg/dL — ABNORMAL HIGH (ref 70–99)
Glucose-Capillary: 132 mg/dL — ABNORMAL HIGH (ref 70–99)
Glucose-Capillary: 276 mg/dL — ABNORMAL HIGH (ref 70–99)
Glucose-Capillary: 81 mg/dL (ref 70–99)

## 2018-07-07 LAB — HEMOGLOBIN AND HEMATOCRIT, BLOOD
HCT: 25.3 % — ABNORMAL LOW (ref 39.0–52.0)
HCT: 24.4 % — ABNORMAL LOW (ref 39.0–52.0)
HCT: 26.9 % — ABNORMAL LOW (ref 39.0–52.0)
HCT: 25.3 % — ABNORMAL LOW (ref 39.0–52.0)
HCT: 25.9 % — ABNORMAL LOW (ref 39.0–52.0)
HCT: 25.9 % — ABNORMAL LOW (ref 39.0–52.0)
Hemoglobin: 8.1 g/dL — ABNORMAL LOW (ref 13.0–17.0)
Hemoglobin: 7.8 g/dL — ABNORMAL LOW (ref 13.0–17.0)
Hemoglobin: 8.6 g/dL — ABNORMAL LOW (ref 13.0–17.0)
Hemoglobin: 8.1 g/dL — ABNORMAL LOW (ref 13.0–17.0)
Hemoglobin: 8.3 g/dL — ABNORMAL LOW (ref 13.0–17.0)
Hemoglobin: 8.3 g/dL — ABNORMAL LOW (ref 13.0–17.0)

## 2018-07-07 MED ORDER — TRAMADOL HCL 50 MG PO TABS
50.0000 mg | ORAL_TABLET | Freq: Four times a day (QID) | ORAL | Status: DC
  Administered 2018-07-07: 100 mg via ORAL
  Administered 2018-07-07 – 2018-07-08 (×4): 50 mg via ORAL
  Filled 2018-07-07: qty 1
  Filled 2018-07-07 (×2): qty 2
  Filled 2018-07-07 (×2): qty 1

## 2018-07-07 NOTE — Progress Notes (Addendum)
[facility] Progress Note  CC:  GI bleed, anemia   Subjective: No abdominal pain. Tolerated a clear liquid diet. No N/V. He passed a brown flaky stools this morning, no blood or melena. Objective:  Vital signs in last 24 hours: Temp:  [98 F (36.7 C)-98.7 F (37.1 C)] 98 F (36.7 C) (03/19 0409) Pulse Rate:  [84-106] 84 (03/19 0409) Resp:  [14-26] 18 (03/19 0409) BP: (138-168)/(76-100) 145/76 (03/19 0409) SpO2:  [97 %-100 %] 99 % (03/19 0409) Last BM Date: 07/06/18 General:   Alert,  Well-developed, in NAD Heart: RRR, no murmurs Pulm: Clear throughout  Abdomen:   Protuberant, soft, nontender, + BS x 4 quads  Extremities:  Without edema. Neurologic:  Alert and  oriented x4;  grossly normal neurologically. Psych:  Alert and cooperative. Normal mood and affect.  Intake/Output from previous day: 03/18 0701 - 03/19 0700 In: 2618.5 [P.O.:120; I.V.:1938.5; Blood:460; IV Piggyback:100] Out: 4 [Urine:4] Intake/Output this shift: No intake/output data recorded.  Lab Results: Recent Labs    07/05/18 1156  07/06/18 1821 07/07/18 0057 07/07/18 0541  WBC 6.0  --   --   --   --   HGB 7.2*   < > 8.4* 8.1* 7.8*  HCT 24.6*   < > 27.0* 25.3* 24.4*  PLT 224  --   --   --   --    < > = values in this interval not displayed.   BMET Recent Labs    07/05/18 1156 07/06/18 0133  NA 138 142  K 4.3 4.4  CL 107 114*  CO2 24 22  GLUCOSE 165* 153*  BUN 24* 19  CREATININE 1.11 0.98  CALCIUM 8.5* 8.0*   LFT Recent Labs    07/05/18 1156  PROT 6.0*  ALBUMIN 3.6  AST 21  ALT 16  ALKPHOS 28*  BILITOT 0.7   PT/INR Recent Labs    07/06/18 0133  LABPROT 16.3*  INR 1.3*    Ct Angio Abd/pel W/ And/or W/o  Result Date: 07/05/2018 CLINICAL DATA:  Rectal bleeding. History of Roux-en-Y gastric bypass. EXAM: CT ANGIOGRAPHY ABDOMEN AND PELVIS WITH CONTRAST AND WITHOUT CONTRAST TECHNIQUE: Multidetector CT imaging of the abdomen and pelvis was performed using the  standard protocol during bolus administration of intravenous contrast. Multiplanar reconstructed images and MIPs were obtained and reviewed to evaluate the vascular anatomy. CONTRAST:  116mL OMNIPAQUE IOHEXOL 350 MG/ML SOLN COMPARISON:  CT of the abdomen and pelvis without contrast on 12/07/2013 and with contrast on 03/24/2013 FINDINGS: VASCULAR Aorta: Normally patent without aneurysmal disease. Mild calcified plaque in the distal aorta. Celiac: Normally patent. Normal branch vessel anatomy. No active bleeding identified at the level of the stomach or duodenum. SMA: Normally patent. No distal branch vessel bleeding, pseudoaneurysm or AV malformations identified. Renals: Widely patent bilateral single renal arteries. IMA: Normally patent. No distal branch vessel bleeding or vascular abnormalities identified. Inflow: Widely patent bilateral iliac arteries. Proximal Outflow: Normally patent common femoral arteries and femoral bifurcations. Veins: Venous phase imaging demonstrates no venous abnormalities. Portal vein, mesenteric veins and splenic vein are normally patent. IVC, iliac veins and common femoral veins are normally patent. Review of the MIP images confirms the above findings. NON-VASCULAR Lower chest: No acute abnormality. Hepatobiliary: No focal liver abnormality is seen. No gallstones, gallbladder wall thickening, or biliary dilatation. Pancreas: Unremarkable. No pancreatic ductal dilatation or surrounding inflammatory changes. Spleen: Normal in size without focal abnormality. Adrenals/Urinary Tract: Adrenal glands are unremarkable. Kidneys are normal, without renal  calculi, focal lesion, or hydronephrosis. Bladder is unremarkable. Stomach/Bowel: Bowel shows no evidence of obstruction, ileus or inflammation. Status post Roux-en-Y gastric bypass without visible complication. Additional small bowel anastomosis in the left mid abdomen. No evidence of free air, focal abscess or free fluid. No obvious lesions  identified. No evidence of intraluminal contrast extravasation on arterial or venous phases of imaging. Lymphatic: No enlarged lymph nodes identified. Reproductive: Prostate is unremarkable. Other: No hernias identified. Musculoskeletal: No acute or significant osseous findings. IMPRESSION: VASCULAR No evidence of active gastrointestinal bleeding by CTA during arterial and delayed venous phases of imaging. No vascular abnormalities are identified by CTA. NON-VASCULAR Status post Roux-en-Y gastric bypass as well as additional small bowel resection. No evidence of acute process or lesion. Electronically Signed   By: Aletta Edouard M.D.   On: 07/05/2018 18:32    Assessment / Plan: 1, 63 y.o. 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 Patient was taking Gabriel Earing Powder 1 to 3 packets daily x 4 weeks. He was taking Protonix 40 mg po bid, switched to Protonix 40 mg in am and Pepcid 40mg  in pm 4 weeks ago. CTA negative for active bleeding. S/P EGD 07/06/2018 which showed a 29mm cratered gastric ulcer with a visible vessel at the [place] anastomosis injected with epi for hemostasis. Source of GI bleed was identified therefore a colonoscopy was not done. No further hematochezia post EGD/colonoscopy.  -Continue PPI ggt for total of 48 hours then Protonix 40mg  bid when discharged home -No NSAIDs  -Full liquid diet  2. Normocytic anemia secondary to GIB. Received 5 units or PRBCs since admission. Today Hg 7.8 << 8.1. HCT 24.4 << 25.3. BUN 19 << 24. -continue to monitor H/H closely,  If next Hg < 8 ( H/H collected at 9:30am) would recommend PRBC transfusion -monitor for further hematochezia/melena   3. History of Colon Polyps. Colonoscopy 07/27/2016: A 2 mm TA polyp to the ascending colon removed by cold snare, internal hemorrhoids. -to verify next surveillance colonoscopy due date at time of GI follow up appt  4. Alcohol overuse  -CIWA protocol -He will be offered ETOH rehab on  discharge as abstinence would reduce his risk of re-bleeding. Thiamine and folate is being supplemented.   5. DM II  Further recommendations per Dr. Fuller Plan     LOS: 2 days   Noralyn Pick  07/07/2018, 7:29 AM      Attending Physician Note   I have taken an interval history, reviewed the chart and examined the patient. I agree with the Advanced Practitioner's note, impression and recommendations.   Gastrojejunal anastomotic ulcer with visible vessel treated with epi inj and Bicap. No recurrent bleeding. Continue IV pantoprazole infusion for 2 more days then change to pantoprazole 40 mg po bid. No NSAIDs. Trend Hgb. Outpatient GI follow up with Dr. Scarlette Shorts in 1 month. Consider repeat EGD in 2-3 months to assess ulcer healing per Dr. Henrene Pastor. GI signing off.   Lucio Edward, MD FACG [phone]

## 2018-07-07 NOTE — Progress Notes (Signed)
PROGRESS NOTE  Brent [name] NGE:[id number] DOB: 1955-06-13 DOA: 07/05/2018 PCP: [name], MD  Brief History   Brent [name] is a 63 y.o. WM PMHx morbid obesity s/p Roux and Y Gastric bypass 03/2011, CAD, HTN, HLD, diabetes type 2, OSA on CPAP, nephrolithiasis, morbid obesity, diverticulosis, colonic polyps, GI bleed  He noticed his Hg level on My chart showed a gradual decrease 12 down to 10.8 over the past year. He was concerned taking Pantoprazole 40mg  bid chronically caused the decrease in his Hg level. He contacted his PCP regarding long term PPI use and he was advised to continue Pantoprazole 40mg  in the am and to stop the PM dose of Pantoprazole and to take Pepcid 40mg  at bed time which he started 4 weeks ago. No heartburn, dysphagia or stomach pain. He reports taking [employer] Powder 1 packet daily for the past 1 to 2 years due to arthritis and left shoulder pain. He had worse left shoulder pain so for the past month he has taken [employer] 1 to 3 packets daily. He takes Tylenol intermittently. He also takes Tramadol 1 tab 5 x daily and Hydrocodone 1 tab daily for arthritis and left shoulder pain. He awakened this morning and saw a moderate amount of red blood on top of a soft to loose brown stool with associated right mid abdominal pain. No nausea or vomiting. He was concerned he was having another GI bleed. He stated his right mid abdominal pain is similar to the pain he had when he suffered from a GI bleed in 2015, but the blood per the rectum then was purple and black. He drinks at least 1/2 a liter of alcohol a day. His last drink was 07/04/2018.  The patient received a total of 5 units of PRBC's to keep his hemoglobin greater than 8.0. He felt that his bleeding was slowing down this morning as he had not seen anything but brown stool with the bowel prep. The patient underwent EGD and colonoscopy today. Ulceration was located at the G-J junction of the patient's prior Roux-en-Y. This  was injected with epinephrine and then coagulated for hemostasis. In the jejunum a marginal ulcer with a visible vessel was noted and injected as well.   The patient was returned on a clear liquid diet and orders for 48 hours of IV protonix. He has now been advanced to a full liquid diet. He is to avoid NSAID, and hemoglobin monitored. He will be offered alcohol rehab as outpatient. [place] Recent Vital Signs   BP 140/88 (BP Location: Left Arm)   Pulse 81   Temp 99 F (37.2 C) (Oral)   Resp 19   Ht 5\' 8"  (1.727 m)   Wt 106.6 kg   SpO2 98%   BMI 35.73 kg/m    Past Medical History:  Diagnosis Date  . Acid reflux disease   . ACID REFLUX DISEASE 07/05/2007  . Anemia   . Atherosclerosis   . Benign neoplasm of colon 07/05/2007  . Bilateral hip pain 10/05/2016  . Breast pain, left 11/17/2011  . Chest pain    a. Reportedly negative dobut echo performed prior to gastric bypass in 03/2011;  b. 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 12/2011 Cath: LM nl, LAD 50p, D1 25m, LCX min irregs, OM3 30, RCA 25p, 48m (FFR 0.99->0.89), PDA 30, EF 65%, Med Rx.  . COLONIC POLYPS, HX OF 10/29/2008  . Diarrhea 06/13/2010  . Diverticulosis 07/05/2018  . Diverticulosis of  colon   . DIVERTICULOSIS, COLON 10/29/2008  . DM (diabetes mellitus), type 2, uncontrolled ([place])   . GI bleed   . Gout 03/04/2013  . Hearing loss 05/24/2013   Previous audiology evaluation completely.  [name] HTN (hypertension) 07/14/2010  . Hx of colonic polyps   . HYPERSOMNIA, ASSOCIATED WITH SLEEP APNEA 07/26/2008  . Hypertension 07/14/2010  . 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 Knee pain, left 10/10/2010  . Morbid obesity ([place]) 03/27/2010   a. 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  [name] Neck pain 03/2015  . Other and unspecified hyperlipidemia 11/16/2012  . Preventative health care 11/17/2011  . Renal stone 11/2013  . Sleep apnea    a. CPAP  . Spinal stenosis   . Tear of meniscus of left knee 2012     Expected Discharge Date  (unknown)   Diet Order            Diet full liquid Room service appropriate? Yes; Fluid consistency: Thin  Diet effective now               VTE Documentation  Sequential compression devices, below knee   Work Intensity Score/Level of Care  2  @LEVELOFCARE @   Mobility  Head of Bed Elevated : Self regulated Activity: Ambulated in room, Ambulated to bathroom Range of Motion: Active, All extremities Level of Assistance: Independent Assistive Device: None Mobility Response: Tolerated well Transport method: Wheelchair     Consult Orders  (From admission, onward)         Start     Ordered   07/07/18 1021  Consult to social work  Once    Comments:  Please offer resources for alcohol rehab as outpatient.  Provider:  (Not yet assigned)  Question:  Reason for Consult:  Answer:  Other (see comments)   07/07/18 1021   07/05/18 1330  Consult to hospitalist  Once    Provider:  Allie Bossier, MD  Question Answer Comment  Place call to: [facility]   Reason for Consult Admit      07/05/18 1329           Significant Events    DC Barriers   Abnormal Labs:  [name] 07/07/2018, 1:53 PM . He has not eaten solid food for 48 hours. His wife is present.   Consultants  . Gastroenterology  Procedures  . EGD and Colonoscopy  Antibiotics  . None  Interval History/Subjective  See above.  Objective   Vitals:  Vitals:   07/07/18 0409 07/07/18 1319  BP: (!) 145/76 140/88  Pulse: 84 81  Resp: 18 19  Temp: 98 F (36.7 C) 99 F (37.2 C)  SpO2: 99% 98%    Exam:  Constitutional:  . Awake, alert, and oriented x 3.  Respiratory:  . CTA bilaterally, no wheezes, rales, or rhonchi are appreciated. . No increased work of breathing.  . No tactile fremitus. Cardiovascular:  . RRR, no murmurs, ectopy, or gallups are appreciated. . No LE extremity edema   . No lateral PMI. No thrills. Abdomen:  . Abdomen is soft, non-tender, non-distended.  . No hernias, masses,  or organomegaly is appreciated . Normoactive bowel sounds.  Musculoskeletal:  . No cyanosis, clubbing or edema. Skin:  . No rashes, lesions, ulcers . palpation of skin: no induration or nodules Neurologic:  . CN 2-12 intact . Sensation all 4 extremities intact Psychiatric:  . Mental status o Mood, affect appropriate o Orientation to person, place, time  . judgment and insight appear  intact    I have personally reviewed the following:   Today's Data  . Hemoglobin/hematocrit. Vitals  Scheduled Meds: . sodium chloride   Intravenous Once  . folic acid  1 mg Oral Daily  . insulin aspart  0-15 Units Subcutaneous Q4H  . multivitamin with minerals  1 tablet Oral Daily  . [START ON 07/10/2018] pantoprazole  40 mg Intravenous Q12H  . thiamine  100 mg Oral Daily   Or  . thiamine  100 mg Intravenous Daily  . traMADol  50-100 mg Oral QID   Continuous Infusions: . sodium chloride 75 mL/hr at 07/06/18 2107  . pantoprozole (PROTONIX) infusion 8 mg/hr (07/07/18 1249)    Active Problems:   T2DM (type 2 diabetes mellitus) ([place])   Morbid obesity ([place])   Essential hypertension   CAD (coronary artery disease)   Obstructive sleep apnea on CPAP   Hyperlipidemia, mixed   History of Roux-en-Y gastric bypass   Hx of adenomatous colonic polyps   Acute GI bleeding   Gastroesophageal reflux disease   Nephrolithiasis   Insomnia   Diverticulosis   GI bleed   Hematochezia   Acute blood loss anemia   Marginal ulcer  A & P  Acute blood loss anemia: The patient has required 5 units of PRBC's to keep his hemoglobin greater than 8.0 as recommended by GI. Monitor H&H. His hemoglobin was 8.6 this morning. Monitor.  GI Bleed secondary to ulceration at the [place] junction of the patient's Roux-En-Y anastamosis and a marginal ulcer in the jejunum. Both were injected and the one at the anastamosis was coagulated. IV protonix x 48 hours and clear liquid diet. No NSAIDS.   ETOH abuse: The patient will be  placed on CIWA protocol. He will be offered ETOH rehab on discharge as abstinence would reduce his risk of re-bleeding. Thiamine and folate is being supplemented.  DM II: FSBS is being followed with SSI. FSBS for the last 24 hours has been 104 - 148. Glucophage being held.  CAD: The patient is being monitored on telemetry. Continue fenofibrate, livalo, and metoprolol. ASA contraindicated due to bleeding. Stable.  Essential Hypertension: Stable currently  GERD: PPI  I have seen and examined this patient myself. I have spent 32 minutes in his evaluation and care.  DVT prophylaxis: SCD's Code Status: Full Code Family Communication: No family available Disposition Plan: tbd  Lavanda Nevels, DO [facility] Direct contact: see [email or web address]  7PM-7AM contact night coverage as above 07/06/2018, 5:15 PM  LOS: 1 day    LOS: 2 days

## 2018-07-08 ENCOUNTER — Encounter (HOSPITAL_COMMUNITY): Payer: Self-pay | Admitting: Internal Medicine

## 2018-07-08 DIAGNOSIS — K284 Chronic or unspecified gastrojejunal ulcer with hemorrhage: Secondary | ICD-10-CM

## 2018-07-08 DIAGNOSIS — K289 Gastrojejunal ulcer, unspecified as acute or chronic, without hemorrhage or perforation: Secondary | ICD-10-CM

## 2018-07-08 DIAGNOSIS — E1100 Type 2 diabetes mellitus with hyperosmolarity without nonketotic hyperglycemic-hyperosmolar coma (NKHHC): Secondary | ICD-10-CM

## 2018-07-08 LAB — CBC WITH DIFFERENTIAL/PLATELET
Abs Immature Granulocytes: 0.01 10*3/uL (ref 0.00–0.07)
Abs Immature Granulocytes: 0.02 10*3/uL (ref 0.00–0.07)
Basophils Absolute: 0 10*3/uL (ref 0.0–0.1)
Basophils Absolute: 0 10*3/uL (ref 0.0–0.1)
Basophils Relative: 1 %
Basophils Relative: 1 %
Eosinophils Absolute: 0.2 10*3/uL (ref 0.0–0.5)
Eosinophils Absolute: 0.2 10*3/uL (ref 0.0–0.5)
Eosinophils Relative: 5 %
Eosinophils Relative: 4 %
HCT: 24.2 % — ABNORMAL LOW (ref 39.0–52.0)
HCT: 23.7 % — ABNORMAL LOW (ref 39.0–52.0)
Hemoglobin: 7.6 g/dL — ABNORMAL LOW (ref 13.0–17.0)
Hemoglobin: 7.4 g/dL — ABNORMAL LOW (ref 13.0–17.0)
Immature Granulocytes: 0 %
Immature Granulocytes: 1 %
Lymphocytes Relative: 36 %
Lymphocytes Relative: 24 %
Lymphs Abs: 1.6 10*3/uL (ref 0.7–4.0)
Lymphs Abs: 0.9 10*3/uL (ref 0.7–4.0)
MCH: 28.4 pg (ref 26.0–34.0)
MCH: 28.6 pg (ref 26.0–34.0)
MCHC: 31.4 g/dL (ref 30.0–36.0)
MCHC: 31.2 g/dL (ref 30.0–36.0)
MCV: 90.3 fL (ref 80.0–100.0)
MCV: 91.5 fL (ref 80.0–100.0)
Monocytes Absolute: 0.6 10*3/uL (ref 0.1–1.0)
Monocytes Absolute: 0.5 10*3/uL (ref 0.1–1.0)
Monocytes Relative: 13 %
Monocytes Relative: 12 %
Neutro Abs: 2 10*3/uL (ref 1.7–7.7)
Neutro Abs: 2.3 10*3/uL (ref 1.7–7.7)
Neutrophils Relative %: 45 %
Neutrophils Relative %: 58 %
Platelets: UNDETERMINED 10*3/uL (ref 150–400)
Platelets: 147 10*3/uL — ABNORMAL LOW (ref 150–400)
RBC: 2.68 MIL/uL — ABNORMAL LOW (ref 4.22–5.81)
RBC: 2.59 MIL/uL — ABNORMAL LOW (ref 4.22–5.81)
RDW: 15.9 % — ABNORMAL HIGH (ref 11.5–15.5)
RDW: 16 % — ABNORMAL HIGH (ref 11.5–15.5)
WBC: 4.4 10*3/uL (ref 4.0–10.5)
WBC: 3.8 10*3/uL — ABNORMAL LOW (ref 4.0–10.5)
nRBC: 0 % (ref 0.0–0.2)
nRBC: 0 % (ref 0.0–0.2)

## 2018-07-08 LAB — BASIC METABOLIC PANEL
Anion gap: 5 (ref 5–15)
Anion gap: 7 (ref 5–15)
BUN: 5 mg/dL — ABNORMAL LOW (ref 8–23)
BUN: 5 mg/dL — ABNORMAL LOW (ref 8–23)
CO2: 24 mmol/L (ref 22–32)
CO2: 24 mmol/L (ref 22–32)
Calcium: 8.1 mg/dL — ABNORMAL LOW (ref 8.9–10.3)
Calcium: 8.6 mg/dL — ABNORMAL LOW (ref 8.9–10.3)
Chloride: 112 mmol/L — ABNORMAL HIGH (ref 98–111)
Chloride: 108 mmol/L (ref 98–111)
Creatinine, Ser: 0.98 mg/dL (ref 0.61–1.24)
Creatinine, Ser: 1.02 mg/dL (ref 0.61–1.24)
GFR calc Af Amer: >60 mL/min (ref >60)
GFR calc Af Amer: >60 mL/min (ref >60)
GFR calc non Af Amer: >60 mL/min (ref >60)
GFR calc non Af Amer: >60 mL/min (ref >60)
Glucose, Bld: 98 mg/dL (ref 70–99)
Glucose, Bld: 166 mg/dL — ABNORMAL HIGH (ref 70–99)
Potassium: 3.4 mmol/L — ABNORMAL LOW (ref 3.5–5.1)
Potassium: 4.2 mmol/L (ref 3.5–5.1)
Sodium: 141 mmol/L (ref 135–145)
Sodium: 139 mmol/L (ref 135–145)

## 2018-07-08 LAB — GLUCOSE, CAPILLARY
Glucose-Capillary: 103 mg/dL — ABNORMAL HIGH (ref 70–99)
Glucose-Capillary: 130 mg/dL — ABNORMAL HIGH (ref 70–99)
Glucose-Capillary: 135 mg/dL — ABNORMAL HIGH (ref 70–99)
Glucose-Capillary: 86 mg/dL (ref 70–99)
Glucose-Capillary: 97 mg/dL (ref 70–99)

## 2018-07-08 LAB — PREPARE RBC (CROSSMATCH)

## 2018-07-08 MED ORDER — SALINE SPRAY 0.65 % NA SOLN: 1.0000 | NASAL | 0 refills | Status: DC | PRN

## 2018-07-08 MED ORDER — SODIUM CHLORIDE 0.9% IV SOLUTION: Freq: Once | INTRAVENOUS | Status: DC

## 2018-07-08 MED ORDER — FOLIC ACID 1 MG PO TABS: 1.0000 mg | ORAL_TABLET | Freq: Every day | ORAL | 0 refills | Status: DC

## 2018-07-08 MED ORDER — POTASSIUM CHLORIDE CRYS ER 20 MEQ PO TBCR
40.0000 meq | EXTENDED_RELEASE_TABLET | Freq: Once | ORAL | Status: AC
  Administered 2018-07-08: 40 meq via ORAL
  Filled 2018-07-08: qty 2

## 2018-07-08 MED ORDER — THIAMINE HCL 100 MG PO TABS: 100.0000 mg | ORAL_TABLET | Freq: Every day | ORAL | 0 refills | Status: DC

## 2018-07-08 NOTE — Progress Notes (Signed)
Pt. hgb value at 0119 resulted at 7.6. Per GI MD note, recommending hgb to be maintained 8.0 and above. On call NP Bodenheimer paged and made aware. Will continue to monitor pt. And will carry out any new orders.

## 2018-07-08 NOTE — Progress Notes (Signed)
Blood bank made this RN aware that due to the blood shortage, they are currently not releasing PRBC's to be infused unless Hgb is 7 or lower. On call NP Bodenheimer paged and made aware. Will make pt. Aware of situation. Will continue to monitor.

## 2018-07-08 NOTE — Discharge Summary (Signed)
Physician Discharge Summary  Brent [name]:[id number] DOB: 1955/11/15 DOA: 07/05/2018  PCP: [name], MD  Admit date: 07/05/2018 Discharge date: 07/08/2018  Recommendations for Outpatient Follow-up:  1. Follow up with PCP in 7-10 days and have CBC checked at that time. 2. Follow up with Dr Henrene Pastor outpatient for GI in one month. The patient will also need to have a follow up EGD in 2-3 months to assess ulcer healing as per Dr. Henrene Pastor. 3. Protonix 40mg  bid on discharge. 4. Avoid alcohol.  5. Avoid NSAIDS   Discharge Diagnoses: Principal diagnosis is #1 1. GI bleed due to a cratered gastric ulcer with a visible vesel at the [place] anastomosis. 2. Acute blood loss anemia 3. Alcohol abuse 4. DM II 5. CAD 6. Hypertension  Discharge Condition: Fair Disposition: Home  Diet recommendation: Diabetic  Filed Weights   07/05/18 1055  Weight: 106.6 kg    History of present illness:    Hospital Course:  Gyasi Hazzard Rhoneyis a 63 y.o.WM PMHxmorbid obesity s/p Roux and YGastric bypass 03/2011,CAD, HTN,HLD, diabetes type 2,OSA on CPAP, nephrolithiasis, morbid obesity, diverticulosis, colonic polyps, GI bleed  He noticed his Hg level on My chart showed a gradual decrease 12 down to 10.8 over the past year. He was concerned taking Pantoprazole 40mg  bid chronically caused the decrease in his Hg level. He contacted his PCP regarding long term PPI use and he was advised to continue Pantoprazole 40mg  in the am and to stop the PM dose of Pantoprazole and to take Pepcid 40mg  at bed time which he started 4 weeks ago. No heartburn, dysphagia or stomach pain. He reports taking [employer] Powder 1 packet daily for the past 1 to 2 years due to arthritis and left shoulder pain. He had worse left shoulder pain so for the past month he has taken [employer] 1 to 3 packets daily. He takes Tylenol intermittently. He also takes Tramadol 1 tab 5 x daily and Hydrocodone 1 tab daily for arthritis and left  shoulder pain. He awakened this morning and saw a moderate amount of red blood on top of a soft to loose brown stool with associated right mid abdominal pain. No nausea or vomiting. He was concerned he was having another GI bleed. He stated his right mid abdominal pain is similar to the pain he had when he suffered from a GI bleed in 2015, but the blood per the rectum then was purple and black. He drinks at least 1/2 a liter of alcohol a day. His last drink was 07/04/2018.  The patient received a total of 5 units of PRBC's to keep his hemoglobin greater than 8.0. He felt that his bleeding was slowing down this morning as he had not seen anything but brown stool with the bowel prep. The patient underwent EGD and colonoscopy today. Ulceration was located at the G-J junction of the patient's prior Roux-en-Y. This was injected with epinephrine and then coagulated for hemostasis. In the jejunum a marginal ulcer with a visible vessel was noted and injected as well.   The patient was returned on a clear liquid diet and orders for 48 hours of IV protonix. He has now been advanced to a full liquid diet. He is to avoid NSAID, and hemoglobin monitored. He will be offered alcohol rehab as outpatient.  His hemoglobin on discharge was 7.4. He has not had any bloody or dark stools in 2-3 days.  Today's assessment: S: The patient is awake, alert, and oriented x 3.  No acute distress. O: Vitals:  Vitals:   07/08/18 1133 07/08/18 1419  BP: (!) 146/92 (!) 152/99  Pulse: 75 77  Resp: 18 18  Temp: 99 F (37.2 C) 98.9 F (37.2 C)  SpO2: 98% 96%    Constitutional:   Appears calm and comfortable Respiratory:   No increased work of breathing.  No wheezes, rales, or rhonchi.  No tactile fremitus. Cardiovascular:   Regular rate and rhythm.   No murmur, ectopy, or gallups.   No LE extremity edema    Normal pedal pulses Abdomen:   Abdomen soft. Non-tender, non-distended.   No hernias, masses, or  organomegaly.  Normoactive bowel sounds. Musculoskeletal:   No cyanosis, clubbing or edema Skin:   No rashes, lesions, ulcers  palpation of skin: no induration or nodules Neurologic:   CN 2-12 intact  Sensation all 4 extremities intact Psychiatric:   judgement and insight appear normal  Mental status o Mood, affect appropriate o Orientation to person, place, time     Discharge Instructions  Discharge Instructions    Activity as tolerated - No restrictions   Complete by:  As directed    Call MD for:   Complete by:  As directed    Bloody, maroon, or black stools or vomiting blood or coffee ground material.   Diet - low sodium heart healthy   Complete by:  As directed    Discharge instructions   Complete by:  As directed    Avoid alcohol and NSAIDS. Follow up with PCP in 7-10 days and get CBC checked then. Follow up with GI as per their instructions. Follow up with alcohol rehab.   Increase activity slowly   Complete by:  As directed      Allergies as of 07/08/2018      Reactions   Bee Venom Anaphylaxis   Lipitor [atorvastatin]    Myalgias, memory changes   Morphine    hyperactive   Hydrocodone Itching   Pravastatin Other (See Comments)   Made joints hurt. --also simvastatin       Medication List    STOP taking these medications   GOODY HEADACHE PO   Krill Oil 1000 MG Caps   oxymetazoline 0.05 % nasal spray Commonly known as:  AFRIN     TAKE these medications   EpiPen 2-Pak 0.3 mg/0.3 mL Soaj injection Generic drug:  EPINEPHrine Inject 0.3 mg into the skin once.   famotidine 40 MG tablet Commonly known as:  PEPCID Take 1 tablet (40 mg total) by mouth at bedtime as needed for heartburn or indigestion. What changed:  when to take this   fenofibrate 160 MG tablet Take 1 tablet (160 mg total) by mouth daily.   folic acid 1 MG tablet Commonly known as:  FOLVITE Take 1 tablet (1 mg total) by mouth daily. Start taking on:  July 09, 2018     gabapentin 300 MG capsule Commonly known as:  NEURONTIN Take 1 capsule (300 mg total) by mouth at bedtime.   glucose blood test strip Use as directed once daily to check blood sugar.  Diagnosis code E11.9   HYDROcodone-acetaminophen 5-325 MG tablet Commonly known as:  Norco Take 1 tablet by mouth daily. What changed:    when to take this  reasons to take this   Livalo 1 MG Tabs Generic drug:  Pitavastatin Calcium TAKE 1 TABLET DAILY What changed:  how much to take   Magnesium 100 MG Caps Take 1 capsule (100 mg total) by mouth  daily.   metFORMIN 500 MG tablet Commonly known as:  GLUCOPHAGE TAKE 1 TABLET FOUR TIMES A DAY   methocarbamol 500 MG tablet Commonly known as:  ROBAXIN TAKE ONE TABLET BY MOUTH EVERY 6 HOURS AS NEEDED FOR MUSCLE SPASMS What changed:  See the new instructions.   metoprolol tartrate 25 MG tablet Commonly known as:  LOPRESSOR TAKE 1 TABLET TWICE A DAY   mometasone 0.1 % cream Commonly known as:  Elocon Apply 1 application topically daily.   multivitamin tablet Take 1 tablet by mouth daily. BARIATRIC VIT   pantoprazole 40 MG tablet Commonly known as:  PROTONIX Take 1 tablet (40 mg total) by mouth 2 (two) times daily before a meal.   sodium chloride 0.65 % Soln nasal spray Commonly known as:  OCEAN Place 1 spray into both nostrils as needed for congestion (nose irritation).   thiamine 100 MG tablet Take 1 tablet (100 mg total) by mouth daily. Start taking on:  July 09, 2018   traMADol 50 MG tablet Commonly known as:  ULTRAM Take 1 tablet po tid and 2 tablet po qhs What changed:    how much to take  how to take this  when to take this  additional instructions   Vitamin B-12 1000 MCG Subl Place 1 each under the tongue every morning.   Vitamin D (Ergocalciferol) 1.25 MG (50000 UT) Caps capsule Commonly known as:  DRISDOL TAKE 1 CAPSULE EVERY 7 DAYS What changed:  See the new instructions.   VITAMIN E PO Take 1 tablet by  mouth daily.      Allergies  Allergen Reactions   Bee Venom Anaphylaxis   Lipitor [Atorvastatin]     Myalgias, memory changes   Morphine     hyperactive   Hydrocodone Itching   Pravastatin Other (See Comments)    Made joints hurt. --also simvastatin     The results of significant diagnostics from this hospitalization (including imaging, microbiology, ancillary and laboratory) are listed below for reference.    Significant Diagnostic Studies: Ct Angio Abd/pel W/ And/or W/o  Result Date: 07/05/2018 CLINICAL DATA:  Rectal bleeding. History of Roux-en-Y gastric bypass. EXAM: CT ANGIOGRAPHY ABDOMEN AND PELVIS WITH CONTRAST AND WITHOUT CONTRAST TECHNIQUE: Multidetector CT imaging of the abdomen and pelvis was performed using the standard protocol during bolus administration of intravenous contrast. Multiplanar reconstructed images and MIPs were obtained and reviewed to evaluate the vascular anatomy. CONTRAST:  159mL OMNIPAQUE IOHEXOL 350 MG/ML SOLN COMPARISON:  CT of the abdomen and pelvis without contrast on 12/07/2013 and with contrast on 03/24/2013 FINDINGS: VASCULAR Aorta: Normally patent without aneurysmal disease. Mild calcified plaque in the distal aorta. Celiac: Normally patent. Normal branch vessel anatomy. No active bleeding identified at the level of the stomach or duodenum. SMA: Normally patent. No distal branch vessel bleeding, pseudoaneurysm or AV malformations identified. Renals: Widely patent bilateral single renal arteries. IMA: Normally patent. No distal branch vessel bleeding or vascular abnormalities identified. Inflow: Widely patent bilateral iliac arteries. Proximal Outflow: Normally patent common femoral arteries and femoral bifurcations. Veins: Venous phase imaging demonstrates no venous abnormalities. Portal vein, mesenteric veins and splenic vein are normally patent. IVC, iliac veins and common femoral veins are normally patent. Review of the MIP images confirms the  above findings. NON-VASCULAR Lower chest: No acute abnormality. Hepatobiliary: No focal liver abnormality is seen. No gallstones, gallbladder wall thickening, or biliary dilatation. Pancreas: Unremarkable. No pancreatic ductal dilatation or surrounding inflammatory changes. Spleen: Normal in size without focal abnormality. Adrenals/Urinary Tract:  Adrenal glands are unremarkable. Kidneys are normal, without renal calculi, focal lesion, or hydronephrosis. Bladder is unremarkable. Stomach/Bowel: Bowel shows no evidence of obstruction, ileus or inflammation. Status post Roux-en-Y gastric bypass without visible complication. Additional small bowel anastomosis in the left mid abdomen. No evidence of free air, focal abscess or free fluid. No obvious lesions identified. No evidence of intraluminal contrast extravasation on arterial or venous phases of imaging. Lymphatic: No enlarged lymph nodes identified. Reproductive: Prostate is unremarkable. Other: No hernias identified. Musculoskeletal: No acute or significant osseous findings. IMPRESSION: VASCULAR No evidence of active gastrointestinal bleeding by CTA during arterial and delayed venous phases of imaging. No vascular abnormalities are identified by CTA. NON-VASCULAR Status post Roux-en-Y gastric bypass as well as additional small bowel resection. No evidence of acute process or lesion. Electronically Signed   By: Aletta Edouard M.D.   On: 07/05/2018 18:32    Microbiology: No results found for this or any previous visit (from the past 240 hour(s)).   Labs: Basic Metabolic Panel: Recent Labs  Lab 07/05/18 1156 07/06/18 0133 07/08/18 0119  NA 138 142 141  K 4.3 4.4 3.4*  CL 107 114* 112*  CO2 24 22 24   GLUCOSE 165* 153* 98  BUN 24* 19 5*  CREATININE 1.11 0.98 0.98  CALCIUM 8.5* 8.0* 8.1*  MG  --  1.5*  --    Liver Function Tests: Recent Labs  Lab 07/05/18 1156  AST 21  ALT 16  ALKPHOS 28*  BILITOT 0.7  PROT 6.0*  ALBUMIN 3.6   No results  for input(s): LIPASE, AMYLASE in the last 168 hours. No results for input(s): AMMONIA in the last 168 hours. CBC: Recent Labs  Lab 07/05/18 1156  07/07/18 1424 07/07/18 1657 07/07/18 2054 07/08/18 0119 07/08/18 0907  WBC 6.0  --   --   --   --  4.4 3.8*  NEUTROABS  --   --   --   --   --  2.0 2.3  HGB 7.2*   < > 8.1* 8.3* 8.3* 7.6* 7.4*  HCT 24.6*   < > 25.3* 25.9* 25.9* 24.2* 23.7*  MCV 86.9  --   --   --   --  90.3 91.5  PLT 224  --   --   --   --  PLATELET CLUMPS NOTED ON SMEAR, UNABLE TO ESTIMATE 147*   < > = values in this interval not displayed.   Cardiac Enzymes: No results for input(s): CKTOTAL, CKMB, CKMBINDEX, TROPONINI in the last 168 hours. BNP: BNP (last 3 results) No results for input(s): BNP in the last 8760 hours.  ProBNP (last 3 results) No results for input(s): PROBNP in the last 8760 hours.  CBG: Recent Labs  Lab 07/07/18 2017 07/08/18 0000 07/08/18 0421 07/08/18 0720 07/08/18 1145  GLUCAP 121* 86 97 103* 130*    Active Problems:   T2DM (type 2 diabetes mellitus) ([place])   Morbid obesity ([facility])   Essential hypertension   CAD (coronary artery disease)   Obstructive sleep apnea on CPAP   Hyperlipidemia, mixed   History of Roux-en-Y gastric bypass   Hx of adenomatous colonic polyps   Acute GI bleeding   Gastroesophageal reflux disease   Nephrolithiasis   Insomnia   Diverticulosis   GI bleed   Hematochezia   Acute blood loss anemia   Marginal ulcer   Time coordinating discharge: 38 minutes.  Signed:        Vishaal Strollo, DO [facility]  07/08/2018, 4:55  PM

## 2018-07-09 ENCOUNTER — Other Ambulatory Visit: Payer: Self-pay | Admitting: Family Medicine

## 2018-07-09 LAB — TYPE AND SCREEN
ABO/RH(D): A POS
Antibody Screen: NEGATIVE
Unit division: 0
Unit division: 0
Unit division: 0
Unit division: 0
Unit division: 0
Unit division: 0

## 2018-07-09 LAB — BPAM RBC
Blood Product Expiration Date: 202004102359
Blood Product Expiration Date: 202004102359
Blood Product Expiration Date: 202004102359
Blood Product Expiration Date: 202004102359
Blood Product Expiration Date: 202004102359
Blood Product Expiration Date: 202004132359
ISSUE DATE / TIME: 202003171357
ISSUE DATE / TIME: 202003171945
ISSUE DATE / TIME: 202003180202
ISSUE DATE / TIME: 202003180443
ISSUE DATE / TIME: 202003181220
ISSUE DATE / TIME: 202003201112
Unit Type and Rh: 6200
Unit Type and Rh: 6200
Unit Type and Rh: 6200
Unit Type and Rh: 6200
Unit Type and Rh: 6200
Unit Type and Rh: 6200

## 2018-07-09 LAB — CBC WITH DIFFERENTIAL/PLATELET
Abs Immature Granulocytes: 0.02 10*3/uL (ref 0.00–0.07)
Basophils Absolute: 0 10*3/uL (ref 0.0–0.1)
Basophils Relative: 1 %
Eosinophils Absolute: 0.2 10*3/uL (ref 0.0–0.5)
Eosinophils Relative: 5 %
HCT: 30.8 % — ABNORMAL LOW (ref 39.0–52.0)
Hemoglobin: 9.3 g/dL — ABNORMAL LOW (ref 13.0–17.0)
Immature Granulocytes: 0 %
Lymphocytes Relative: 24 %
Lymphs Abs: 1.1 10*3/uL (ref 0.7–4.0)
MCH: 27.9 pg (ref 26.0–34.0)
MCHC: 30.2 g/dL (ref 30.0–36.0)
MCV: 92.5 fL (ref 80.0–100.0)
Monocytes Absolute: 0.5 10*3/uL (ref 0.1–1.0)
Monocytes Relative: 11 %
Neutro Abs: 2.8 10*3/uL (ref 1.7–7.7)
Neutrophils Relative %: 59 %
Platelets: 182 10*3/uL (ref 150–400)
RBC: 3.33 MIL/uL — ABNORMAL LOW (ref 4.22–5.81)
RDW: 15.7 % — ABNORMAL HIGH (ref 11.5–15.5)
WBC: 4.7 10*3/uL (ref 4.0–10.5)
nRBC: 0 % (ref 0.0–0.2)

## 2018-07-11 ENCOUNTER — Encounter: Payer: Self-pay | Admitting: Family Medicine

## 2018-07-11 ENCOUNTER — Encounter: Payer: Self-pay | Admitting: Family

## 2018-07-13 ENCOUNTER — Encounter: Payer: Self-pay | Admitting: Family Medicine

## 2018-07-13 ENCOUNTER — Other Ambulatory Visit: Payer: Self-pay | Admitting: Family Medicine

## 2018-07-13 ENCOUNTER — Other Ambulatory Visit: Payer: Self-pay

## 2018-07-13 ENCOUNTER — Other Ambulatory Visit: Payer: Self-pay | Admitting: Family

## 2018-07-13 DIAGNOSIS — D649 Anemia, unspecified: Secondary | ICD-10-CM

## 2018-07-13 MED ORDER — HYDROCODONE-ACETAMINOPHEN 5-325 MG PO TABS: 1.0000 | ORAL_TABLET | Freq: Every day | ORAL | 0 refills | Status: DC | PRN

## 2018-07-13 NOTE — Telephone Encounter (Signed)
Requesting: [place]: Yes UDS:Yes, low risk, next screen 12/06/2018 Last OV: 06/07/2018 Next OV: 09/06/2018 Last Refill: 05/17/2018, #30--0 RF Database:   Please advise

## 2018-07-14 ENCOUNTER — Inpatient Hospital Stay: Attending: Family | Admitting: Family

## 2018-07-14 ENCOUNTER — Inpatient Hospital Stay

## 2018-07-14 ENCOUNTER — Encounter: Payer: Self-pay | Admitting: Family

## 2018-07-14 ENCOUNTER — Other Ambulatory Visit: Payer: Self-pay

## 2018-07-14 VITALS — BP 127/66 | HR 76 | Temp 98.6°F | Resp 18 | Ht 68.0 in | Wt 247.1 lb

## 2018-07-14 DIAGNOSIS — D649 Anemia, unspecified: Secondary | ICD-10-CM

## 2018-07-14 DIAGNOSIS — K922 Gastrointestinal hemorrhage, unspecified: Secondary | ICD-10-CM | POA: Insufficient documentation

## 2018-07-14 DIAGNOSIS — Z8711 Personal history of peptic ulcer disease: Secondary | ICD-10-CM | POA: Diagnosis not present

## 2018-07-14 DIAGNOSIS — Z79899 Other long term (current) drug therapy: Secondary | ICD-10-CM | POA: Insufficient documentation

## 2018-07-14 DIAGNOSIS — K909 Intestinal malabsorption, unspecified: Secondary | ICD-10-CM | POA: Diagnosis not present

## 2018-07-14 DIAGNOSIS — G4730 Sleep apnea, unspecified: Secondary | ICD-10-CM | POA: Diagnosis not present

## 2018-07-14 DIAGNOSIS — D500 Iron deficiency anemia secondary to blood loss (chronic): Secondary | ICD-10-CM | POA: Diagnosis present

## 2018-07-14 DIAGNOSIS — I2510 Atherosclerotic heart disease of native coronary artery without angina pectoris: Secondary | ICD-10-CM | POA: Insufficient documentation

## 2018-07-14 DIAGNOSIS — M5410 Radiculopathy, site unspecified: Secondary | ICD-10-CM | POA: Insufficient documentation

## 2018-07-14 DIAGNOSIS — K219 Gastro-esophageal reflux disease without esophagitis: Secondary | ICD-10-CM | POA: Diagnosis not present

## 2018-07-14 DIAGNOSIS — I10 Essential (primary) hypertension: Secondary | ICD-10-CM | POA: Insufficient documentation

## 2018-07-14 DIAGNOSIS — M4800 Spinal stenosis, site unspecified: Secondary | ICD-10-CM | POA: Diagnosis not present

## 2018-07-14 DIAGNOSIS — G992 Myelopathy in diseases classified elsewhere: Secondary | ICD-10-CM | POA: Diagnosis not present

## 2018-07-14 DIAGNOSIS — Z86010 Personal history of colonic polyps: Secondary | ICD-10-CM | POA: Insufficient documentation

## 2018-07-14 DIAGNOSIS — Z9884 Bariatric surgery status: Secondary | ICD-10-CM | POA: Insufficient documentation

## 2018-07-14 DIAGNOSIS — Z7984 Long term (current) use of oral hypoglycemic drugs: Secondary | ICD-10-CM | POA: Insufficient documentation

## 2018-07-14 DIAGNOSIS — R7989 Other specified abnormal findings of blood chemistry: Secondary | ICD-10-CM

## 2018-07-14 DIAGNOSIS — Z87891 Personal history of nicotine dependence: Secondary | ICD-10-CM | POA: Insufficient documentation

## 2018-07-14 DIAGNOSIS — E119 Type 2 diabetes mellitus without complications: Secondary | ICD-10-CM | POA: Insufficient documentation

## 2018-07-14 DIAGNOSIS — E669 Obesity, unspecified: Secondary | ICD-10-CM | POA: Diagnosis not present

## 2018-07-14 DIAGNOSIS — Z87442 Personal history of urinary calculi: Secondary | ICD-10-CM | POA: Diagnosis not present

## 2018-07-14 DIAGNOSIS — R718 Other abnormality of red blood cells: Secondary | ICD-10-CM

## 2018-07-14 LAB — CMP (CANCER CENTER ONLY)
ALT: 11 U/L (ref 0–44)
AST: 15 U/L (ref 15–41)
Albumin: 4.1 g/dL (ref 3.5–5.0)
Alkaline Phosphatase: 32 U/L — ABNORMAL LOW (ref 38–126)
Anion gap: 8 (ref 5–15)
BUN: 12 mg/dL (ref 8–23)
CO2: 26 mmol/L (ref 22–32)
Calcium: 8.4 mg/dL — ABNORMAL LOW (ref 8.9–10.3)
Chloride: 106 mmol/L (ref 98–111)
Creatinine: 0.95 mg/dL (ref 0.61–1.24)
GFR, Est AFR Am: >60 mL/min (ref >60)
GFR, Estimated: >60 mL/min (ref >60)
Glucose, Bld: 207 mg/dL — ABNORMAL HIGH (ref 70–99)
Potassium: 4 mmol/L (ref 3.5–5.1)
Sodium: 140 mmol/L (ref 135–145)
Total Bilirubin: 0.4 mg/dL (ref 0.3–1.2)
Total Protein: 5.8 g/dL — ABNORMAL LOW (ref 6.5–8.1)

## 2018-07-14 LAB — CBC WITH DIFFERENTIAL (CANCER CENTER ONLY)
Abs Immature Granulocytes: 0.01 10*3/uL (ref 0.00–0.07)
Basophils Absolute: 0 10*3/uL (ref 0.0–0.1)
Basophils Relative: 1 %
Eosinophils Absolute: 0.1 10*3/uL (ref 0.0–0.5)
Eosinophils Relative: 2 %
HCT: 29.4 % — ABNORMAL LOW (ref 39.0–52.0)
Hemoglobin: 9.2 g/dL — ABNORMAL LOW (ref 13.0–17.0)
Immature Granulocytes: 0 %
Lymphocytes Relative: 22 %
Lymphs Abs: 1 10*3/uL (ref 0.7–4.0)
MCH: 28.4 pg (ref 26.0–34.0)
MCHC: 31.3 g/dL (ref 30.0–36.0)
MCV: 90.7 fL (ref 80.0–100.0)
Monocytes Absolute: 0.4 10*3/uL (ref 0.1–1.0)
Monocytes Relative: 9 %
Neutro Abs: 3.1 10*3/uL (ref 1.7–7.7)
Neutrophils Relative %: 66 %
Platelet Count: 248 10*3/uL (ref 150–400)
RBC: 3.24 MIL/uL — ABNORMAL LOW (ref 4.22–5.81)
RDW: 14.8 % (ref 11.5–15.5)
WBC Count: 4.6 10*3/uL (ref 4.0–10.5)
nRBC: 0 % (ref 0.0–0.2)

## 2018-07-14 LAB — RETICULOCYTES
Immature Retic Fract: 22.6 % — ABNORMAL HIGH (ref 2.3–15.9)
RBC.: 3.24 MIL/uL — ABNORMAL LOW (ref 4.22–5.81)
Retic Count, Absolute: 68 10*3/uL (ref 19.0–186.0)
Retic Ct Pct: 2.1 % (ref 0.4–3.1)

## 2018-07-14 LAB — VITAMIN B12: Vitamin B-12: 1097 pg/mL — ABNORMAL HIGH (ref 180–914)

## 2018-07-14 LAB — LACTATE DEHYDROGENASE: LDH: 189 U/L (ref 98–192)

## 2018-07-14 LAB — SAVE SMEAR(SSMR), FOR PROVIDER SLIDE REVIEW

## 2018-07-14 LAB — SAMPLE TO BLOOD BANK

## 2018-07-14 NOTE — Progress Notes (Signed)
Hematology/Oncology Consultation   Name: Brent [name]      MRN: [id number]    Location: Room/bed info not found  Date: 07/14/2018 Time:10:49 AM   REFERRING PHYSICIAN: [name], MD  REASON FOR CONSULT: Anemia   DIAGNOSIS: Anemia associated with malabsorption as well as recent acute GI bleed  HISTORY OF PRESENT ILLNESS: Brent [name] is a pleasant 63 yo caucasian gentleman with anemia he states has present over the last 4-5 years.  He has history of Roux-en-Y gastric bypass in 2012.  He was hospitalized earlier this month for GI bleed and associated anemia. He received a total of 5 units of blood during admission. He had an endoscopy with Dr. Hilarie Fredrickson on 07/06/2018 which showed a cratered gastric ulcer with a visible vessel at the [place] anastomosis.  He is currently taking Protonix, Pepcid and folic acid daily.  He has discontinued the use of NSAID's.  Since being home he has not noted any episodes of bleeding. No bruising or petechiae.  He is symptomatic with low energy and chewing ice.  No family history of anemia that he is aware of.  No personal or familial history of cancer.  He is concerned with having been exposed to a contaminated water supply while at [facility] from [phone] which has been associated with aplastic anemia and other myelodysplastic diseases.  He also has history of low B 12 and vitamin D and is on supplements for these as well.  He is diabetic and takes metformin daily as prescribed. His blood sugars are fairly well controlled.  No fever, chills, n/v, cough, rash, dizziness, SOB, chest pain, palpitations, abdominal pain or changes in bowel or bladder habits.  No swelling in his extremities at this time.  He has history of intermittent numbness and tingling in his right hand due to spinal stenosis.  No lymphadenopathy noted on exam.  He states that he drinks 2 beers a night.   He was a form 30 year 1 1/2 ppd smoker. He quit in 1993.  He has maintained a good appetite  and is staying well hydrated. His states that his weight is stable.   ROS: All other 10 point review of systems is negative.   PAST MEDICAL HISTORY:   Past Medical History:  Diagnosis Date  . Acid reflux disease   . ACID REFLUX DISEASE 07/05/2007  . Anemia   . Atherosclerosis   . Benign neoplasm of colon 07/05/2007  . Bilateral hip pain 10/05/2016  . Breast pain, left 11/17/2011  . Chest pain    a. Reportedly negative dobut echo performed prior to gastric bypass in 03/2011;  b. 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 12/2011 Cath: LM nl, LAD 50p, D1 29m LCX min irregs, OM3 30, RCA 25p, 782mFFR 0.99->0.89), PDA 30, EF 65%, Med Rx.  . COLONIC POLYPS, HX OF 10/29/2008  . Diarrhea 06/13/2010  . Diverticulosis 07/05/2018  . Diverticulosis of colon   . DIVERTICULOSIS, COLON 10/29/2008  . DM (diabetes mellitus), type 2, uncontrolled ([place]  . GI bleed   . Gout 03/04/2013  . Hearing loss 05/24/2013   Previous audiology evaluation completely.  . Marland KitchenTN (hypertension) 07/14/2010  . Hx of colonic polyps   . HYPERSOMNIA, ASSOCIATED WITH SLEEP APNEA 07/26/2008  . Hypertension 07/14/2010  . 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 Knee pain, left 10/10/2010  . Morbid obesity ([place]12/11/2009   a. 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  . Marland [place] pain 03/2015  . Other and unspecified hyperlipidemia  11/16/2012  . Preventative health care 11/17/2011  . Renal stone 11/2013  . Sleep apnea    a. CPAP  . Spinal stenosis   . Tear of meniscus of left knee 2012    ALLERGIES: Allergies  Allergen Reactions  . Bee Venom Anaphylaxis  . Lipitor [Atorvastatin]     Myalgias, memory changes  . Morphine     hyperactive  . Hydrocodone Itching  . Pravastatin Other (See Comments)    Made joints hurt. --also simvastatin       MEDICATIONS:  Current Outpatient Medications on File Prior to Visit  Medication Sig Dispense Refill  . Cyanocobalamin (VITAMIN B-12) 1000 MCG SUBL Place 1 each under the tongue every morning.     [name] EPIPEN  2-PAK 0.3 MG/0.3ML SOAJ injection Inject 0.3 mg into the skin once.    . famotidine (PEPCID) 40 MG tablet Take 1 tablet (40 mg total) by mouth at bedtime as needed for heartburn or indigestion. (Patient taking differently: Take 40 mg by mouth daily. ) 90 tablet 1  . fenofibrate 160 MG tablet TAKE 1 TABLET DAILY 90 tablet 1  . folic acid (FOLVITE) 1 MG tablet Take 1 tablet (1 mg total) by mouth daily. 30 tablet 0  . gabapentin (NEURONTIN) 300 MG capsule Take 1 capsule (300 mg total) by mouth at bedtime. 90 capsule 0  . glucose blood test strip Use as directed once daily to check blood sugar.  Diagnosis code E11.9 100 each 12  . HYDROcodone-acetaminophen (NORCO) 5-325 MG tablet Take 1 tablet by mouth daily as needed for moderate pain. 30 tablet 0  . LIVALO 1 MG TABS TAKE 1 TABLET DAILY (Patient taking differently: Take 1 mg by mouth daily. ) 90 tablet 4  . Magnesium 100 MG CAPS Take 1 capsule (100 mg total) by mouth daily. 30 capsule 1  . metFORMIN (GLUCOPHAGE) 500 MG tablet TAKE 1 TABLET FOUR TIMES A DAY (Patient taking differently: Take 500 mg by mouth 4 (four) times daily. ) 360 tablet 1  . methocarbamol (ROBAXIN) 500 MG tablet TAKE ONE TABLET BY MOUTH EVERY 6 HOURS AS NEEDED FOR MUSCLE SPASMS (Patient taking differently: Take 500 mg by mouth 4 (four) times daily. ) 120 tablet 5  . metoprolol tartrate (LOPRESSOR) 25 MG tablet TAKE 1 TABLET TWICE A DAY (Patient taking differently: Take 25 mg by mouth 2 (two) times daily. ) 180 tablet 3  . mometasone (ELOCON) 0.1 % cream Apply 1 application topically daily. (Patient not taking: Reported on 07/05/2018) 50 g 1  . Multiple Vitamin (MULTIVITAMIN) tablet Take 1 tablet by mouth daily. BARIATRIC VIT    . pantoprazole (PROTONIX) 40 MG tablet Take 1 tablet (40 mg total) by mouth 2 (two) times daily before a meal. 180 tablet 3  . sodium chloride (OCEAN) 0.65 % SOLN nasal spray Place 1 spray into both nostrils as needed for congestion (nose irritation). 30 mL 0   . thiamine 100 MG tablet Take 1 tablet (100 mg total) by mouth daily. 30 tablet 0  . traMADol (ULTRAM) 50 MG tablet Take 1 tablet po tid and 2 tablet po qhs (Patient taking differently: Take 50-100 mg by mouth 4 (four) times daily. 64m tid then 1045mat bedtime) 105 tablet 0  . Vitamin D, Ergocalciferol, (DRISDOL) 1.25 MG (50000 UT) CAPS capsule TAKE 1 CAPSULE EVERY 7 DAYS (Patient taking differently: Take 50,000 Units by mouth every 7 (seven) days. ) 12 capsule 4  . VITAMIN E PO Take 1 tablet by mouth  daily.    . [DISCONTINUED] ramipril (ALTACE) 10 MG tablet Take 1 tablet (10 mg total) by mouth daily. 2 tabs po daily 180 tablet 1   No current facility-administered medications on file prior to visit.      PAST SURGICAL HISTORY Past Surgical History:  Procedure Laterality Date  . ANKLE SURGERY  1994  . CARDIAC CATHETERIZATION     denies any chest pain in the past 2 years  . COLONOSCOPY    . colonoscopy polyps    . ESOPHAGOGASTRODUODENOSCOPY N/A 03/27/2013   Procedure: ESOPHAGOGASTRODUODENOSCOPY (EGD);  Surgeon: Irene Shipper, MD;  Location: [facility];  Service: Endoscopy;  Laterality: N/A;  . ESOPHAGOGASTRODUODENOSCOPY (EGD) WITH PROPOFOL N/A 07/06/2018   Procedure: ESOPHAGOGASTRODUODENOSCOPY (EGD) WITH PROPOFOL;  Surgeon: Jerene Bears, MD;  Location: [facility];  Service: Gastroenterology;  Laterality: N/A;  . GASTRIC BYPASS    . HERNIA REPAIR    . HOT HEMOSTASIS N/A 07/06/2018   Procedure: HOT HEMOSTASIS (ARGON PLASMA COAGULATION/BICAP);  Surgeon: Jerene Bears, MD;  Location: [facility];  Service: Gastroenterology;  Laterality: N/A;  . KNEE ARTHROSCOPY  11/06/10   Left, torn meniscus (repaired)  . LEFT HEART CATHETERIZATION WITH CORONARY ANGIOGRAM N/A 12/23/2011   Procedure: LEFT HEART CATHETERIZATION WITH CORONARY ANGIOGRAM;  Surgeon: Minus Breeding, MD;  Location: [facility] CATH LAB;  Service: Cardiovascular;  Laterality: N/A;  . right knee arthroscopy Right 07/05/14   Dr. Hart Robinsons, [place].  [name] SCHLEROTHERAPY  07/06/2018   Procedure: Woodward Ku;  Surgeon: Jerene Bears, MD;  Location: [facility];  Service: Gastroenterology;;  . TONSILLECTOMY  age 3  . TOTAL KNEE ARTHROPLASTY Right 01/20/2016   Procedure: RIGHT TOTAL KNEE ARTHROPLASTY;  Surgeon: Paralee Cancel, MD;  Location: [facility];  Service: Orthopedics;  Laterality: Right;  . UPPER GI ENDOSCOPY  03/27/13    FAMILY HISTORY: Family History  Problem Relation Age of Onset  . Diabetes Mother   . Hypertension Mother   . Stroke Mother   . Hyperlipidemia Mother   . Hypertension Father   . Colon polyps Father   . Heart attack Father 81  . Stroke Father   . Heart attack Brother   . Diabetes Brother   . Heart disease Brother   . Heart attack Brother        Multiple  . Diabetes Brother   . Other Brother        heart problems  . Heart disease Brother   . Diabetes Sister   . Obesity Brother   . Diabetes Brother   . Heart disease Brother   . Hypertension Maternal Grandmother   . ADD / ADHD Daughter   . Heart disease Brother   . Stomach cancer Neg Hx   . Colon cancer Neg Hx   . Esophageal cancer Neg Hx   . Rectal cancer Neg Hx     SOCIAL HISTORY:  reports that he quit smoking about 27 years ago. His smoking use included cigarettes. He has a 30.00 pack-year smoking history. He has never used smokeless tobacco. He reports current alcohol use of about 3.0 standard drinks of alcohol per week. He reports that he does not use drugs.  PERFORMANCE STATUS: The patient's performance status is 1 - Symptomatic but completely ambulatory  PHYSICAL EXAM: Most Recent Vital Signs: There were no vitals taken for this visit. BP 127/66 (BP Location: Left Arm, Patient Position: Sitting)   Pulse 76   Temp 98.6 F (37 C) (Oral)   Resp 18  Ht 5' 8" (1.727 m)   Wt 247 lb 1.9 oz (112.1 kg)   SpO2 100%   BMI 37.57 kg/m   General Appearance:    Alert, cooperative, no distress, appears stated age  Head:     Normocephalic, without obvious abnormality, atraumatic  Eyes:    PERRL, conjunctiva/corneas clear, EOM's intact, fundi    benign, both eyes             Throat:   Lips, mucosa, and tongue normal; teeth and gums normal  Neck:   Supple, symmetrical, trachea midline, no adenopathy;       thyroid:  No enlargement/tenderness/nodules; no carotid   bruit or JVD  Back:     Symmetric, no curvature, ROM normal, no CVA tenderness  Lungs:     Clear to auscultation bilaterally, respirations unlabored  Chest wall:    No tenderness or deformity  Heart:    Regular rate and rhythm, S1 and S2 normal, no murmur, rub   or gallop  Abdomen:     Soft, non-tender, bowel sounds active all four quadrants,    no masses, no organomegaly        Extremities:   Extremities normal, atraumatic, no cyanosis or edema  Pulses:   2+ and symmetric all extremities  Skin:   Skin color, texture, turgor normal, no rashes or lesions  Lymph nodes:   Cervical, supraclavicular, and axillary nodes normal  Neurologic:   CNII-XII intact. Normal strength, sensation and reflexes      throughout    LABORATORY DATA:  Results for orders placed or performed in visit on 07/14/18 (from the past 48 hour(s))  CBC with Differential (Cancer Center Only)     Status: Abnormal   Collection Time: 07/14/18 10:20 AM  Result Value Ref Range   WBC Count 4.6 4.0 - 10.5 K/uL   RBC 3.24 (L) 4.22 - 5.81 MIL/uL   Hemoglobin 9.2 (L) 13.0 - 17.0 g/dL   HCT 29.4 (L) 39.0 - 52.0 %   MCV 90.7 80.0 - 100.0 fL   MCH 28.4 26.0 - 34.0 pg   MCHC 31.3 30.0 - 36.0 g/dL   RDW 14.8 11.5 - 15.5 %   Platelet Count 248 150 - 400 K/uL   nRBC 0.0 0.0 - 0.2 %   Neutrophils Relative % 66 %   Neutro Abs 3.1 1.7 - 7.7 K/uL   Lymphocytes Relative 22 %   Lymphs Abs 1.0 0.7 - 4.0 K/uL   Monocytes Relative 9 %   Monocytes Absolute 0.4 0.1 - 1.0 K/uL   Eosinophils Relative 2 %   Eosinophils Absolute 0.1 0.0 - 0.5 K/uL   Basophils Relative 1 %   Basophils Absolute 0.0  0.0 - 0.1 K/uL   Immature Granulocytes 0 %   Abs Immature Granulocytes 0.01 0.00 - 0.07 K/uL    Comment: Performed at [facility], [address] [place], [place] [address]  Reticulocytes     Status: Abnormal   Collection Time: 07/14/18 10:22 AM  Result Value Ref Range   Retic Ct Pct 2.1 0.4 - 3.1 %   RBC. 3.24 (L) 4.22 - 5.81 MIL/uL   Retic Count, Absolute 68.0 19.0 - 186.0 K/uL   Immature Retic Fract 22.6 (H) 2.3 - 15.9 %    Comment: Performed at [facility] at [facility], [address] [place], [place] [address]      RADIOGRAPHY: No results found.  PATHOLOGY: None  ASSESSMENT/PLAN: Brent [name] is a pleasant 63 yo caucasian gentleman with multifactorial anemia. He has history of gastric bypass in 2012 as well as a recent GI blood loss due to ulcer.  He was also exposed to contaminated water while stationed at [employer] from [phone].  Dr. Marin Olp was able to view his blood smear and did not note any evidence of malignancy. No bone marrow biopsy needed at this time.  We will see what the rest of his blood work shows and determine if he would benefit from IV iron and or Aranesp.  We will schedule a follow-up once these results are available.   All questions were answered and he is in agreement with the plan. He will contact our office with any questions or concerns. We can certainly see him sooner if need be.   He was discussed with and also seen by Dr. Marin Olp and he is in agreement with the aforementioned.   Laverna Peace, NP   Addendum: I saw and examined Brent [name] with [facility] Roch.  I think him for service to our country.  He was a Company secretary for 20 years.  He served [employer].  He has multiple reasons for being anemic.  He has gastric bypass.  He had a bleeding ulcer.  I think I really issue is whether or not he has underlying myelodysplasia.  He apparently had been at [facility] in [place]  back in the 70s and 80s when apparently, benzene was being put in the water.  I looked at his blood smear under the microscope.  I do not see any nucleated red blood cells.  I saw no immature appearing white blood cells.  He has some mild anisocytosis.  Platelets looked okay.  I had to believe that he is going to have just iron deficiency.  Henrene Pastor says when he was hospitalized he had 6 units of blood.  I am surprised if he had that much.  He is also diabetic.  We will have to see what his erythropoietin level is.  I think this will be important.  We spent about 45 minutes with him today.  We spent all the time face-to-face.  We answered all of his questions.  We reviewed his lab work with him.  We helped counsel him and will get his follow-up appointments all set up.  Again, he is a former Company secretary.  Lattie Haw, MD

## 2018-07-15 ENCOUNTER — Encounter: Payer: Self-pay | Admitting: Family Medicine

## 2018-07-15 LAB — FERRITIN: Ferritin: <4 ng/mL — ABNORMAL LOW (ref 24–336)

## 2018-07-15 LAB — IRON AND TIBC
Iron: 45 ug/dL (ref 42–163)
Saturation Ratios: 10 % — ABNORMAL LOW (ref 20–55)
TIBC: 428 ug/dL — ABNORMAL HIGH (ref 202–409)
UIBC: 383 ug/dL — ABNORMAL HIGH (ref 117–376)

## 2018-07-15 LAB — VITAMIN D 25 HYDROXY (VIT D DEFICIENCY, FRACTURES): Vit D, 25-Hydroxy: 40.7 ng/mL (ref 30.0–100.0)

## 2018-07-15 LAB — ERYTHROPOIETIN: Erythropoietin: 42.6 m[IU]/mL — ABNORMAL HIGH (ref 2.6–18.5)

## 2018-07-15 LAB — TESTOSTERONE: Testosterone: 421 ng/dL (ref 264–916)

## 2018-07-18 ENCOUNTER — Telehealth: Payer: Self-pay | Admitting: Family

## 2018-07-18 ENCOUNTER — Other Ambulatory Visit: Payer: Self-pay | Admitting: Family

## 2018-07-18 DIAGNOSIS — D500 Iron deficiency anemia secondary to blood loss (chronic): Secondary | ICD-10-CM

## 2018-07-18 DIAGNOSIS — D509 Iron deficiency anemia, unspecified: Secondary | ICD-10-CM | POA: Insufficient documentation

## 2018-07-18 DIAGNOSIS — K909 Intestinal malabsorption, unspecified: Secondary | ICD-10-CM

## 2018-07-18 NOTE — Telephone Encounter (Signed)
Spoke with patient regarding appointments added to his schedule per 3/30 sch msg

## 2018-07-20 ENCOUNTER — Inpatient Hospital Stay: Attending: Family

## 2018-07-20 ENCOUNTER — Other Ambulatory Visit: Payer: Self-pay

## 2018-07-20 VITALS — BP 136/72 | HR 86 | Temp 98.3°F | Resp 18

## 2018-07-20 DIAGNOSIS — K909 Intestinal malabsorption, unspecified: Secondary | ICD-10-CM

## 2018-07-20 DIAGNOSIS — D500 Iron deficiency anemia secondary to blood loss (chronic): Secondary | ICD-10-CM | POA: Diagnosis not present

## 2018-07-20 MED ORDER — SODIUM CHLORIDE 0.9 % IV SOLN
510.0000 mg | Freq: Once | INTRAVENOUS | Status: AC
  Administered 2018-07-20: 510 mg via INTRAVENOUS
  Filled 2018-07-20: qty 17

## 2018-07-20 MED ORDER — SODIUM CHLORIDE 0.9 % IV SOLN
Freq: Once | INTRAVENOUS | Status: AC
  Administered 2018-07-20: 14:00:00 via INTRAVENOUS
  Filled 2018-07-20: qty 250

## 2018-07-20 NOTE — Patient Instructions (Signed)

## 2018-07-27 ENCOUNTER — Other Ambulatory Visit: Payer: Self-pay

## 2018-07-27 ENCOUNTER — Inpatient Hospital Stay

## 2018-07-27 VITALS — BP 130/66 | HR 88 | Temp 98.9°F | Resp 18

## 2018-07-27 DIAGNOSIS — D500 Iron deficiency anemia secondary to blood loss (chronic): Secondary | ICD-10-CM | POA: Diagnosis not present

## 2018-07-27 DIAGNOSIS — K909 Intestinal malabsorption, unspecified: Secondary | ICD-10-CM

## 2018-07-27 MED ORDER — SODIUM CHLORIDE 0.9 % IV SOLN
Freq: Once | INTRAVENOUS | Status: AC
  Administered 2018-07-27: 14:00:00 via INTRAVENOUS
  Filled 2018-07-27: qty 250

## 2018-07-27 MED ORDER — SODIUM CHLORIDE 0.9 % IV SOLN
510.0000 mg | Freq: Once | INTRAVENOUS | Status: AC
  Administered 2018-07-27: 510 mg via INTRAVENOUS
  Filled 2018-07-27: qty 17

## 2018-07-27 NOTE — Patient Instructions (Signed)
Ferumoxytol injection What is this medicine? FERUMOXYTOL is an iron complex. Iron is used to make healthy red blood cells, which carry oxygen and nutrients throughout the body. This medicine is used to treat iron deficiency anemia. This medicine may be used for other purposes; ask your health care provider or pharmacist if you have questions. COMMON BRAND NAME(S): Feraheme What should I tell my health care provider before I take this medicine? They need to know if you have any of these conditions: -anemia not caused by low iron levels -high levels of iron in the blood -magnetic resonance imaging (MRI) test scheduled -an unusual or allergic reaction to iron, other medicines, foods, dyes, or preservatives -pregnant or trying to get pregnant -breast-feeding How should I use this medicine? This medicine is for injection into a vein. It is given by a health care professional in a hospital or clinic setting. Talk to your pediatrician regarding the use of this medicine in children. Special care may be needed. Overdosage: If you think you have taken too much of this medicine contact a poison control center or emergency room at once. NOTE: This medicine is only for you. Do not share this medicine with others. What if I miss a dose? It is important not to miss your dose. Call your doctor or health care professional if you are unable to keep an appointment. What may interact with this medicine? This medicine may interact with the following medications: -other iron products This list may not describe all possible interactions. Give your health care provider a list of all the medicines, herbs, non-prescription drugs, or dietary supplements you use. Also tell them if you smoke, drink alcohol, or use illegal drugs. Some items may interact with your medicine. What should I watch for while using this medicine? Visit your doctor or healthcare professional regularly. Tell your doctor or healthcare professional  if your symptoms do not start to get better or if they get worse. You may need blood work done while you are taking this medicine. You may need to follow a special diet. Talk to your doctor. Foods that contain iron include: whole grains/cereals, dried fruits, beans, or peas, leafy green vegetables, and organ meats (liver, kidney). What side effects may I notice from receiving this medicine? Side effects that you should report to your doctor or health care professional as soon as possible: -allergic reactions like skin rash, itching or hives, swelling of the face, lips, or tongue -breathing problems -changes in blood pressure -feeling faint or lightheaded, falls -fever or chills -flushing, sweating, or hot feelings -swelling of the ankles or feet Side effects that usually do not require medical attention (report to your doctor or health care professional if they continue or are bothersome): -diarrhea -headache -nausea, vomiting -stomach pain This list may not describe all possible side effects. Call your doctor for medical advice about side effects. You may report side effects to FDA at 1-800-FDA-1088. Where should I keep my medicine? This drug is given in a hospital or clinic and will not be stored at home. NOTE: This sheet is a summary. It may not cover all possible information. If you have questions about this medicine, talk to your doctor, pharmacist, or health care provider.  2019 Elsevier/Gold Standard (2016-05-25 20:21:10) Anemia  Anemia is a condition in which you do not have enough red blood cells or hemoglobin. Hemoglobin is a substance in red blood cells that carries oxygen. When you do not have enough red blood cells or hemoglobin (are anemic),  your body cannot get enough oxygen and your organs may not work properly. As a result, you may feel very tired or have other problems. What are the causes? Common causes of anemia include:  Excessive bleeding. Anemia can be caused by  excessive bleeding inside or outside the body, including bleeding from the intestine or from periods in women.  Poor nutrition.  Long-lasting (chronic) kidney, thyroid, and liver disease.  Bone marrow disorders.  Cancer and treatments for cancer.  HIV (human immunodeficiency virus) and AIDS (acquired immunodeficiency syndrome).  Treatments for HIV and AIDS.  Spleen problems.  Blood disorders.  Infections, medicines, and autoimmune disorders that destroy red blood cells. What are the signs or symptoms? Symptoms of this condition include:  Minor weakness.  Dizziness.  Headache.  Feeling heartbeats that are irregular or faster than normal (palpitations).  Shortness of breath, especially with exercise.  Paleness.  Cold sensitivity.  Indigestion.  Nausea.  Difficulty sleeping.  Difficulty concentrating. Symptoms may occur suddenly or develop slowly. If your anemia is mild, you may not have symptoms. How is this diagnosed? This condition is diagnosed based on:  Blood tests.  Your medical history.  A physical exam.  Bone marrow biopsy. Your health care provider may also check your stool (feces) for blood and may do additional testing to look for the cause of your bleeding. You may also have other tests, including:  Imaging tests, such as a CT scan or MRI.  Endoscopy.  Colonoscopy. How is this treated? Treatment for this condition depends on the cause. If you continue to lose a lot of blood, you may need to be treated at a hospital. Treatment may include:  Taking supplements of iron, vitamin Q25, or folic acid.  Taking a hormone medicine (erythropoietin) that can help to stimulate red blood cell growth.  Having a blood transfusion. This may be needed if you lose a lot of blood.  Making changes to your diet.  Having surgery to remove your spleen. Follow these instructions at home:  Take over-the-counter and prescription medicines only as told by your  health care provider.  Take supplements only as told by your health care provider.  Follow any diet instructions that you were given.  Keep all follow-up visits as told by your health care provider. This is important. Contact a health care provider if:  You develop new bleeding anywhere in the body. Get help right away if:  You are very weak.  You are short of breath.  You have pain in your abdomen or chest.  You are dizzy or feel faint.  You have trouble concentrating.  You have bloody or black, tarry stools.  You vomit repeatedly or you vomit up blood. Summary  Anemia is a condition in which you do not have enough red blood cells or enough of a substance in your red blood cells that carries oxygen (hemoglobin).  Symptoms may occur suddenly or develop slowly.  If your anemia is mild, you may not have symptoms.  This condition is diagnosed with blood tests as well as a medical history and physical exam. Other tests may be needed.  Treatment for this condition depends on the cause of the anemia. This information is not intended to replace advice given to you by your health care provider. Make sure you discuss any questions you have with your health care provider. Document Released: 05/14/2004 Document Revised: 05/08/2016 Document Reviewed: 05/08/2016 Elsevier Interactive Patient Education  2019 [employer].

## 2018-07-28 ENCOUNTER — Encounter: Payer: Self-pay | Admitting: Family

## 2018-08-01 ENCOUNTER — Encounter: Payer: Self-pay | Admitting: *Deleted

## 2018-08-02 ENCOUNTER — Ambulatory Visit (INDEPENDENT_AMBULATORY_CARE_PROVIDER_SITE_OTHER): Admitting: Internal Medicine

## 2018-08-02 ENCOUNTER — Encounter: Payer: Self-pay | Admitting: Internal Medicine

## 2018-08-02 ENCOUNTER — Other Ambulatory Visit: Payer: Self-pay

## 2018-08-02 VITALS — Ht 68.0 in | Wt 236.0 lb

## 2018-08-02 DIAGNOSIS — D62 Acute posthemorrhagic anemia: Secondary | ICD-10-CM | POA: Diagnosis not present

## 2018-08-02 DIAGNOSIS — D500 Iron deficiency anemia secondary to blood loss (chronic): Secondary | ICD-10-CM

## 2018-08-02 DIAGNOSIS — K250 Acute gastric ulcer with hemorrhage: Secondary | ICD-10-CM

## 2018-08-02 NOTE — Progress Notes (Signed)
HISTORY OF PRESENT ILLNESS:  Brent [name] is a 63 y.o. male with multiple medical problems as listed below.  Patient has had progressive iron deficiency anemia over the past year.  Hemoglobin last year was approximately 12.  In February it was 10.8.  MCV 80.9.  He subsequently developed acute GI bleeding July 05, 2022 which she was hospitalized.  Hemoglobin at that time drifted as low as 6.2.  He received about 5 units of blood.  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  This was on the jejunal side.  Visible vessel required endoscopic hemostatic therapy though he was not acutely bleeding at the time.  He was discharged home on twice daily PPI and 1 iron supplement with vitamin C.  He did have follow-up with hematology July 14, 2018.  He was iron deficient with ferritin 4.0 and hemoglobin 9.2.  He has received IV iron infusion x2.  Should be noted that the patient had been using chronic headache powders for arthritic pain.  He did have an acute GI bleed December 2014 with no observed lesions.  It was felt that he probably had a duodenal ulcer in the excluded anatomy.  This point he states he is feeling better.  Tolerating his current therapies.  Continues to take twice daily PPI.  No longer using headache powders or NSAIDs.  REVIEW OF SYSTEMS:  All non-GI ROS negative less otherwise stated in the HPI except for joint aches  Past Medical History:  Diagnosis Date  . Acid reflux disease   . ACID REFLUX DISEASE 07/05/2007  . Acute gastric ulcer with bleeding   . Alcohol abuse   . Anemia   . Atherosclerosis   . Benign neoplasm of colon 07/05/2007  . Bilateral hip pain 10/05/2016  . Breast pain, left 11/17/2011  . CAD (coronary artery disease)   . Chest pain    a. Reportedly negative dobut echo performed prior to gastric bypass in 03/2011;  b. 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  12/2011 Cath: LM nl, LAD 50p, D1 32m, LCX min irregs, OM3 30, RCA 25p, 33m (FFR 0.99->0.89), PDA 30, EF 65%, Med Rx.  . COLONIC POLYPS, HX OF 10/29/2008  . Diarrhea 06/13/2010  . Diverticulosis 07/05/2018  . DIVERTICULOSIS, COLON 10/29/2008  . DM (diabetes mellitus), type 2, uncontrolled ([place])   . GI bleed   . Gout 03/04/2013  . Hearing loss 05/24/2013   Previous audiology evaluation completely.  [name] HTN (hypertension) 07/14/2010  . Hx of colonic polyps   . HYPERSOMNIA, ASSOCIATED WITH SLEEP APNEA 07/26/2008  . Hypertension 07/14/2010  . 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 Internal hemorrhoids   . Knee pain, left 10/10/2010  . Morbid obesity ([place]) 03/27/2010   a. 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  [name] Neck pain 03/2015  . Other and unspecified hyperlipidemia 11/16/2012  . Preventative health care 11/17/2011  . Renal stone 11/2013  . Sleep apnea    a. CPAP  . Spinal stenosis   . Tear of meniscus of left knee 2012    Past Surgical History:  Procedure Laterality Date  . ANKLE SURGERY Right 1994  . BICEPS TENDON REPAIR Right   . CARDIAC CATHETERIZATION     denies any chest pain in the past 2 years  . COLONOSCOPY    . colonoscopy polyps    . ESOPHAGOGASTRODUODENOSCOPY N/A 03/27/2013   Procedure: ESOPHAGOGASTRODUODENOSCOPY (EGD);  Surgeon: Irene Shipper, MD;  Location: [facility];  Service: Endoscopy;  Laterality: N/A;  . ESOPHAGOGASTRODUODENOSCOPY (EGD) WITH PROPOFOL N/A 07/06/2018   Procedure: ESOPHAGOGASTRODUODENOSCOPY (EGD) WITH PROPOFOL;  Surgeon: Jerene Bears, MD;  Location: [facility];  Service: Gastroenterology;  Laterality: N/A;  . GASTRIC BYPASS    . HERNIA REPAIR    . HOT HEMOSTASIS N/A 07/06/2018   Procedure: HOT HEMOSTASIS (ARGON PLASMA COAGULATION/BICAP);  Surgeon: Jerene Bears, MD;  Location: [facility];  Service: Gastroenterology;  Laterality: N/A;  . KNEE ARTHROSCOPY Left 11/06/2010   Left, torn meniscus (repaired)  . LEFT HEART CATHETERIZATION WITH CORONARY ANGIOGRAM N/A  12/23/2011   Procedure: LEFT HEART CATHETERIZATION WITH CORONARY ANGIOGRAM;  Surgeon: Minus Breeding, MD;  Location[facility] C[facility] LAB;  Service: Cardiovascular;  Laterality: N/A;  . REPLACEMENT TOTAL KNEE Right   . right knee arthroscopy Right 07/05/14   Dr. Hart Robinsons, [place].  [name] ROTATOR CUFF REPAIR Right   . SCHLEROTHERAPY  07/06/2018   Procedure: Woodward Ku;  Surgeon: Jerene Bears, MD;  Location: [facility];  Service: Gastroenterology;;  . TONSILLECTOMY  age 54  . TOTAL KNEE ARTHROPLASTY Right 01/20/2016   Procedure: RIGHT TOTAL KNEE ARTHROPLASTY;  Surgeon: Paralee Cancel, MD;  Location: [facility];  Service: Orthopedics;  Laterality: Right;  . UPPER GI ENDOSCOPY  03/27/13    Social History Brent [name]  reports that he quit smoking about 27 years ago. His smoking use included cigarettes. He has a 30.00 pack-year smoking history. He has never used smokeless tobacco. He reports current alcohol use of about 3.0 standard drinks of alcohol per week. He reports that he does not use drugs.  family history includes ADD / ADHD in his daughter; Colon polyps in his father; Diabetes in his brother, brother, brother, mother, and sister; Heart attack in his brother and brother; Heart attack (age of onset: 9) in his father; Heart disease in his brother, brother, and brother; Hyperlipidemia in his mother; Hypertension in his father, maternal grandmother, and mother; Obesity in his brother; Stroke in his father and mother.  Allergies  Allergen Reactions  . Bee Venom Anaphylaxis  . Lipitor [Atorvastatin] Other (See Comments)    Myalgias, memory changes  . Morphine Other (See Comments)    hyperactive  . Hydrocodone Itching  . Pravastatin Other (See Comments)    Made joints hurt. --also simvastatin        PHYSICAL EXAMINATION:  No physical exam with telemedicine visit   ASSESSMENT:  1.  Acute upper GI bleed secondary to marginal ulcer in a patient status post Roux-en-Y gastric bypass surgery.   Status post endoscopic hemostatic therapy. 2.  Acute blood loss anemia requiring transfusions 3.  Progressive iron deficiency anemia over time.  Likely a combination of low-level chronic blood loss from marginal ulcer as well as suboptimal iron absorption in a patient post gastric bypass surgery.  Now being followed by hematology with appropriate iron replacement 4.  History of multiple adenomatous colon polyps.  Previous examinations 2002, 2004, 2007, 2012, and most recently April 2018.  Most recent examination with diminutive adenoma and hemorrhoids.  Follow-up in 5 years recommended   PLAN:  1.  Continue twice daily PPI 2.  Avoid all NSAIDs.  Okay for baby aspirin if medically important 3.  Continue close follow-up with hematology to ensure adequate iron replacement therapy 4.  SCHEDULE EGD and LEC with me in approximately 4 to 6 weeks to assess for ulcer healing.The nature of the procedure, as well  as the risks, benefits, and alternatives were carefully and thoroughly reviewed with the patient. Ample time for discussion and questions allowed. The patient understood, was satisfied, and agreed to proceed. 5.  Routine surveillance colonoscopy planned around 2023  This telemedicine visit was initiated and consented for by the patient.  The encounter was initiated as a visual and audio encounter via WebEx.  Technical difficulties with the Internet required conversion to telephone only visit.  The patient was in his home and I was in my office during the encounter.  He understands it may be associated professional charge for this service.

## 2018-08-03 NOTE — Patient Instructions (Addendum)
Continue pantoprazole 40 mg twice daily.  Please Avoid all NSAID's. Some examples of NSAID's are as follows: Ibuprofen (Advil, Motrin, Nuprin) Ketoprofen (Actron, Orudis) Naproxen (Aleve) Daypro  Indocin  Lodine  Naprosyn  Relafen  Vimovo Voltaren  You may take a baby aspirin only if it is medically important.  Continue close follow-up with hematology to ensure adequate iron replacement therapy.  You will be due for a recall colonoscopy in 2023. We will send you a reminder in the mail when it gets closer to that time.  We will contact you within the next few weeks to get you scheduled for endoscopy as soon as a schedule becomes available. At that time, we will go over prep instructions with you.

## 2018-08-09 ENCOUNTER — Encounter: Payer: Self-pay | Admitting: Family Medicine

## 2018-08-10 ENCOUNTER — Other Ambulatory Visit: Payer: Self-pay | Admitting: Family Medicine

## 2018-08-10 MED ORDER — HYDROCODONE-ACETAMINOPHEN 5-325 MG PO TABS: 1.0000 | ORAL_TABLET | Freq: Every day | ORAL | 0 refills | Status: DC

## 2018-08-11 ENCOUNTER — Ambulatory Visit: Admitting: Internal Medicine

## 2018-08-19 ENCOUNTER — Encounter: Payer: Self-pay | Admitting: Family Medicine

## 2018-08-20 ENCOUNTER — Other Ambulatory Visit: Payer: Self-pay | Admitting: Family Medicine

## 2018-08-22 ENCOUNTER — Encounter: Payer: Self-pay | Admitting: Family

## 2018-08-22 ENCOUNTER — Inpatient Hospital Stay: Attending: Hematology & Oncology

## 2018-08-22 ENCOUNTER — Ambulatory Visit

## 2018-08-22 ENCOUNTER — Telehealth: Payer: Self-pay | Admitting: Family

## 2018-08-22 ENCOUNTER — Inpatient Hospital Stay (HOSPITAL_BASED_OUTPATIENT_CLINIC_OR_DEPARTMENT_OTHER): Admitting: Family

## 2018-08-22 ENCOUNTER — Other Ambulatory Visit: Payer: Self-pay

## 2018-08-22 ENCOUNTER — Other Ambulatory Visit: Payer: Self-pay | Admitting: Family

## 2018-08-22 VITALS — BP 135/57 | HR 78 | Resp 19 | Ht 68.0 in | Wt 250.8 lb

## 2018-08-22 DIAGNOSIS — D500 Iron deficiency anemia secondary to blood loss (chronic): Secondary | ICD-10-CM | POA: Insufficient documentation

## 2018-08-22 DIAGNOSIS — Z7984 Long term (current) use of oral hypoglycemic drugs: Secondary | ICD-10-CM | POA: Insufficient documentation

## 2018-08-22 DIAGNOSIS — R202 Paresthesia of skin: Secondary | ICD-10-CM | POA: Insufficient documentation

## 2018-08-22 DIAGNOSIS — K909 Intestinal malabsorption, unspecified: Secondary | ICD-10-CM | POA: Diagnosis not present

## 2018-08-22 DIAGNOSIS — R5383 Other fatigue: Secondary | ICD-10-CM | POA: Insufficient documentation

## 2018-08-22 DIAGNOSIS — Z79899 Other long term (current) drug therapy: Secondary | ICD-10-CM | POA: Diagnosis not present

## 2018-08-22 DIAGNOSIS — K922 Gastrointestinal hemorrhage, unspecified: Secondary | ICD-10-CM | POA: Insufficient documentation

## 2018-08-22 DIAGNOSIS — Z9884 Bariatric surgery status: Secondary | ICD-10-CM | POA: Insufficient documentation

## 2018-08-22 DIAGNOSIS — D649 Anemia, unspecified: Secondary | ICD-10-CM

## 2018-08-22 DIAGNOSIS — R200 Anesthesia of skin: Secondary | ICD-10-CM | POA: Diagnosis not present

## 2018-08-22 LAB — CBC WITH DIFFERENTIAL (CANCER CENTER ONLY)
Abs Immature Granulocytes: 0.01 10*3/uL (ref 0.00–0.07)
Basophils Absolute: 0.1 10*3/uL (ref 0.0–0.1)
Basophils Relative: 1 %
Eosinophils Absolute: 0.1 10*3/uL (ref 0.0–0.5)
Eosinophils Relative: 4 %
HCT: 37.8 % — ABNORMAL LOW (ref 39.0–52.0)
Hemoglobin: 12 g/dL — ABNORMAL LOW (ref 13.0–17.0)
Immature Granulocytes: 0 %
Lymphocytes Relative: 26 %
Lymphs Abs: 1 10*3/uL (ref 0.7–4.0)
MCH: 29.1 pg (ref 26.0–34.0)
MCHC: 31.7 g/dL (ref 30.0–36.0)
MCV: 91.7 fL (ref 80.0–100.0)
Monocytes Absolute: 0.4 10*3/uL (ref 0.1–1.0)
Monocytes Relative: 11 %
Neutro Abs: 2.3 10*3/uL (ref 1.7–7.7)
Neutrophils Relative %: 58 %
Platelet Count: 209 10*3/uL (ref 150–400)
RBC: 4.12 MIL/uL — ABNORMAL LOW (ref 4.22–5.81)
RDW: 16.3 % — ABNORMAL HIGH (ref 11.5–15.5)
WBC Count: 3.9 10*3/uL — ABNORMAL LOW (ref 4.0–10.5)
nRBC: 0 % (ref 0.0–0.2)

## 2018-08-22 LAB — RETICULOCYTES
Immature Retic Fract: 15 % (ref 2.3–15.9)
RBC.: 4.1 MIL/uL — ABNORMAL LOW (ref 4.22–5.81)
Retic Count, Absolute: 65.6 10*3/uL (ref 19.0–186.0)
Retic Ct Pct: 1.6 % (ref 0.4–3.1)

## 2018-08-22 NOTE — Telephone Encounter (Signed)
Appointments scheduled  Spoke with patient per 5/4 los

## 2018-08-22 NOTE — Progress Notes (Signed)
Per pt, no allergies to soy or egg products.Pt not taking any weight loss meds or using  O2 at home.  Pt denied sedation problems.  Pt refused emmi instructions.  The PV was done over the phone due to COVID-19. I verified the pt's address and insurance. I reviewed paperwork and prep instructions with the pt and will mail the paperwork to the pt.  Pt was advised to call our office with any questions or changes prior to his procedure. He understood.

## 2018-08-22 NOTE — Progress Notes (Signed)
Hematology and Oncology Follow Up Visit  Brent [name] [id number] Aug 19, 1955 63 y.o. 08/22/2018   Principle Diagnosis:  Iron deficiency anemia secondary to malabsorption as well as recent acute GI bleed  Current Therapy:   IV iron as indicated    Interim History:  Brent [name] is here today for follow-up. He is doing well and his fatigue has improved.  He has not noted any more blood loss. No bruising.  He did note some petechiae on his lower extremities after his second dose of IV iron. No itching or redness and this resolved on its own after a day or so.  He will be having a repeat endoscopy with Dr. Henrene Pastor on May 13th to re-evaluate his ulcer.  No fever, chills, n/v, cough, dizziness, SOB, chest pain, palpitations, abdominal pain or changes in bowel or bladder habits.  No swelling or tenderness in his extremities.  He has occasional numbness and tingling in his right hands he states due to spinal stenosis in the neck. He has some exercises that he does to help relieve this.  No lymphadenopathy noted on exam.  He has maintained a good appetite and is staying well hydrated.   ECOG Performance Status: 1 - Symptomatic but completely ambulatory  Medications:  Allergies as of 08/22/2018      Reactions   Bee Venom Anaphylaxis   Lipitor [atorvastatin] Other (See Comments)   Myalgias, memory changes   Morphine Other (See Comments)   hyperactive   Hydrocodone Itching   Pravastatin Other (See Comments)   Made joints hurt. --also simvastatin       Medication List       Accurate as of Aug 22, 2018 11:00 AM. Always use your most recent med list.        EpiPen 2-Pak 0.3 mg/0.3 mL Soaj injection Generic drug:  EPINEPHrine Inject 0.3 mg into the skin once.   fenofibrate 160 MG tablet TAKE 1 TABLET DAILY   folic acid 1 MG tablet Commonly known as:  FOLVITE Take 1 tablet (1 mg total) by mouth daily.   gabapentin 300 MG capsule Commonly known as:  NEURONTIN Take 1 capsule (300 mg  total) by mouth at bedtime.   glucose blood test strip Use as directed once daily to check blood sugar.  Diagnosis code E11.9   HYDROcodone-acetaminophen 5-325 MG tablet Commonly known as:  Norco Take 1 tablet by mouth at bedtime.   Livalo 1 MG Tabs Generic drug:  Pitavastatin Calcium TAKE 1 TABLET DAILY   Magnesium 100 MG Caps Take 1 capsule (100 mg total) by mouth daily.   metFORMIN 500 MG tablet Commonly known as:  GLUCOPHAGE TAKE 1 TABLET FOUR TIMES A DAY   methocarbamol 500 MG tablet Commonly known as:  ROBAXIN TAKE ONE TABLET BY MOUTH EVERY 6 HOURS AS NEEDED FOR MUSCLE SPASMS   metoprolol tartrate 25 MG tablet Commonly known as:  LOPRESSOR TAKE 1 TABLET TWICE A DAY   mometasone 0.1 % cream Commonly known as:  Elocon Apply 1 application topically daily.   multivitamin tablet Take 1 tablet by mouth daily. BARIATRIC VIT   pantoprazole 40 MG tablet Commonly known as:  PROTONIX Take 1 tablet (40 mg total) by mouth 2 (two) times daily before a meal.   sodium chloride 0.65 % Soln nasal spray Commonly known as:  OCEAN Place 1 spray into both nostrils as needed for congestion (nose irritation).   thiamine 100 MG tablet Take 1 tablet (100 mg total) by mouth daily.  Vitamin B-12 1000 MCG Subl Place 1 each under the tongue every morning.   Vitamin D (Ergocalciferol) 1.25 MG (50000 UT) Caps capsule Commonly known as:  DRISDOL TAKE 1 CAPSULE EVERY 7 DAYS   VITAMIN E PO Take 1 tablet by mouth daily.       Allergies:  Allergies  Allergen Reactions  . Bee Venom Anaphylaxis  . Lipitor [Atorvastatin] Other (See Comments)    Myalgias, memory changes  . Morphine Other (See Comments)    hyperactive  . Hydrocodone Itching  . Pravastatin Other (See Comments)    Made joints hurt. --also simvastatin     Past Medical History, Surgical history, Social history, and Family History were reviewed and updated.  Review of Systems: All other 10 point review of systems  is negative.   Physical Exam:  vitals were not taken for this visit.   Wt Readings from Last 3 Encounters:  08/02/18 236 lb (107 kg)  08/01/18 236 lb (107 kg)  07/14/18 247 lb 1.9 oz (112.1 kg)    Ocular: Sclerae unicteric, pupils equal, round and reactive to light Ear-nose-throat: Oropharynx clear, dentition fair Lymphatic: No cervical or supraclavicular adenopathy Lungs no rales or rhonchi, good excursion bilaterally Heart regular rate and rhythm, no murmur appreciated Abd soft, nontender, positive bowel sounds, no liver or spleen tip palpated on exam, no fluid wave  MSK no focal spinal tenderness, no joint edema Neuro: non-focal, well-oriented, appropriate affect Breasts: Deferred   Lab Results  Component Value Date   WBC 3.9 (L) 08/22/2018   HGB 12.0 (L) 08/22/2018   HCT 37.8 (L) 08/22/2018   MCV 91.7 08/22/2018   PLT 209 08/22/2018   Lab Results  Component Value Date   FERRITIN <4 (L) 07/14/2018   IRON 45 07/14/2018   TIBC 428 (H) 07/14/2018   UIBC 383 (H) 07/14/2018   IRONPCTSAT 10 (L) 07/14/2018   Lab Results  Component Value Date   RETICCTPCT 1.6 08/22/2018   RBC 4.12 (L) 08/22/2018   RBC 4.10 (L) 08/22/2018   No results found for: KPAFRELGTCHN, LAMBDASER, KAPLAMBRATIO No results found for: IGGSERUM, IGA, IGMSERUM No results found for: Kathrynn Ducking, MSPIKE, SPEI   Chemistry      Component Value Date/Time   NA 140 07/14/2018 1021   K 4.0 07/14/2018 1021   CL 106 07/14/2018 1021   CO2 26 07/14/2018 1021   BUN 12 07/14/2018 1021   CREATININE 0.95 07/14/2018 1021   CREATININE 0.97 11/21/2013 0948      Component Value Date/Time   CALCIUM 8.4 (L) 07/14/2018 1021   ALKPHOS 32 (L) 07/14/2018 1021   AST 15 07/14/2018 1021   ALT 11 07/14/2018 1021   BILITOT 0.4 07/14/2018 1021       Impression and Plan: Brent [name] is a very pleasant 63 yo caucasian gentleman with iron deficiency anemia secondary to  malabsorption (gastric bypass) as well intermittent GI blood loss with ulcer.  He has improved nicely with 2 IV iron infusions.  We will see what his iron studies today show and bring him back in for another infusion if needed.  We will plan to see him back in another 3 months for follow-up.  He will contact our office with any questions or concerns. We can certainly see him sooner if need be.   Laverna Peace, NP 5/4/202011:00 AM

## 2018-08-23 LAB — IRON AND TIBC
Iron: 70 ug/dL (ref 42–163)
Saturation Ratios: 16 % — ABNORMAL LOW (ref 20–55)
TIBC: 438 ug/dL — ABNORMAL HIGH (ref 202–409)
UIBC: 368 ug/dL (ref 117–376)

## 2018-08-23 LAB — FERRITIN: Ferritin: 23 ng/mL — ABNORMAL LOW (ref 24–336)

## 2018-08-25 ENCOUNTER — Encounter: Payer: Self-pay | Admitting: Family Medicine

## 2018-08-25 ENCOUNTER — Telehealth: Payer: Self-pay | Admitting: Internal Medicine

## 2018-08-25 ENCOUNTER — Other Ambulatory Visit: Payer: Self-pay

## 2018-08-25 NOTE — Telephone Encounter (Signed)
Pt called stating ortho doctor wants to start him on prednisone dose pack for his back. Pt wanted to make sure it was ok to take this medication with upcoming procedure. Discussed with pt that this should be fine for him to take.

## 2018-08-25 NOTE — Telephone Encounter (Signed)
Patient is having a procedure done on 5-13 and is going to start taking a steroid for his back. Wants to know if its okay to still take it or wait until after the procedure.

## 2018-08-26 ENCOUNTER — Encounter: Payer: Self-pay | Admitting: Family Medicine

## 2018-08-26 MED ORDER — METHOCARBAMOL 500 MG PO TABS: ORAL_TABLET | ORAL | 5 refills | Status: DC

## 2018-08-30 ENCOUNTER — Telehealth: Payer: Self-pay | Admitting: *Deleted

## 2018-08-30 NOTE — Telephone Encounter (Signed)
Covid-19 travel screening questions  Have you traveled in the last 14 days?no If yes where?  Do you now or have you had a fever in the last 14 days?no  Do you have any respiratory symptoms of shortness of breath or cough now or in the last 14 days?no  Do you have a medical history of Congestive Heart Failure?  Do you have a medical history of lung disease?  Do you have any family members or close contacts with diagnosed or suspected Covid-19?no    Pt made aware of care partner policy and was told to bring a mask if he had one available. SM

## 2018-08-31 ENCOUNTER — Ambulatory Visit (AMBULATORY_SURGERY_CENTER): Admitting: Internal Medicine

## 2018-08-31 ENCOUNTER — Encounter: Payer: Self-pay | Admitting: Internal Medicine

## 2018-08-31 ENCOUNTER — Other Ambulatory Visit: Payer: Self-pay

## 2018-08-31 ENCOUNTER — Encounter: Admitting: Internal Medicine

## 2018-08-31 VITALS — BP 145/87 | HR 64 | Temp 98.4°F | Resp 22 | Ht 68.0 in | Wt 250.0 lb

## 2018-08-31 DIAGNOSIS — D500 Iron deficiency anemia secondary to blood loss (chronic): Secondary | ICD-10-CM | POA: Diagnosis not present

## 2018-08-31 DIAGNOSIS — K250 Acute gastric ulcer with hemorrhage: Secondary | ICD-10-CM | POA: Diagnosis not present

## 2018-08-31 MED ORDER — SODIUM CHLORIDE 0.9 % IV SOLN: 500.0000 mL | Freq: Once | INTRAVENOUS | Status: DC

## 2018-08-31 NOTE — Progress Notes (Signed)
PT taken to PACU. Monitors in place. VSS. Report given to RN. 

## 2018-08-31 NOTE — Op Note (Signed)
[facility] Patient Name: Brent [name] Procedure Date: 08/31/2018 2:09 PM MRN: [id number] Endoscopist: Docia Chuck. Henrene Pastor , MD Age: 63 Referring MD:  Date of Birth: 1956/03/03 Gender: Male Account #: [id number] Procedure:                Upper GI endoscopy Indications:              Follow-up of acute gastric ulcer. Status post                            Roux-en-Y gastric bypass surgery remotely. Acute GI                            bleed secondary to marginal ulcer July 06, 2018 Medicines:                Monitored Anesthesia Care Procedure:                Pre-Anesthesia Assessment:                           - Prior to the procedure, a History and Physical                            was performed, and patient medications and                            allergies were reviewed. The patient's tolerance of                            previous anesthesia was also reviewed. The risks                            and benefits of the procedure and the sedation                            options and risks were discussed with the patient.                            All questions were answered, and informed consent                            was obtained. Prior Anticoagulants: The patient has                            taken no previous anticoagulant or antiplatelet                            agents. ASA Grade Assessment: II - A patient with                            mild systemic disease. After reviewing the risks                            and benefits, the patient was deemed in  satisfactory condition to undergo the procedure.                           After obtaining informed consent, the endoscope was                            passed under direct vision. Throughout the                            procedure, the patient's blood pressure, pulse, and                            oxygen saturations were monitored continuously. The                            Endoscope  was introduced through the mouth, and                            advanced to the jejunum. The upper GI endoscopy was                            accomplished without difficulty. The patient                            tolerated the procedure well. Scope In: Scope Out: Findings:                 The esophagus was normal.                           The stomach revealed evidence of prior Roux-en-Y                            gastric bypass surgery.                           The anastomosis was widely patent. One small bit of                            surgical suture material noted. Adjacent was an                            area of healed ulceration..                           The examined jejunum was normal. Complications:            No immediate complications. Estimated Blood Loss:     Estimated blood loss: none. Impression:               1. Status post Roux-en-Y gastric bypass surgery                           2. Healed marginal ulcer                           3. Marginal ulcer secondary  to NSAIDs                           4. Iron deficiency anemia secondary to GI blood                            loss AND malabsorption from altered anatomy. Recommendation:           1. Continue PPI twice daily                           2. Avoid NSAIDs, including headache powders                           3. Continue to follow-up with hematology. I suspect                            that you will need iron infusion therapy                            periodically long-term. Last hemoglobin was 12.0.                            Last ferritin 23                           4. GI follow-up as needed Docia Chuck. Henrene Pastor, MD 08/31/2018 2:32:20 PM This report has been signed electronically.

## 2018-08-31 NOTE — Patient Instructions (Signed)
   Continue reflux medication (PPI) twice daily  Continue to follow up with Hematology. Follow Up with GI as needed   Avoid N-saids including headache  Powders   YOU HAD AN ENDOSCOPIC PROCEDURE TODAY AT [facility]:   Refer to the procedure report that was given to you for any specific questions about what was found during the examination.  If the procedure report does not answer your questions, please call your gastroenterologist to clarify.  If you requested that your care partner not be given the details of your procedure findings, then the procedure report has been included in a sealed envelope for you to review at your convenience later.  YOU SHOULD EXPECT: Some feelings of bloating in the abdomen. Passage of more gas than usual.  Walking can help get rid of the air that was put into your GI tract during the procedure and reduce the bloating. If you had a lower endoscopy (such as a colonoscopy or flexible sigmoidoscopy) you may notice spotting of blood in your stool or on the toilet paper. If you underwent a bowel prep for your procedure, you may not have a normal bowel movement for a few days.  Please Note:  You might notice some irritation and congestion in your nose or some drainage.  This is from the oxygen used during your procedure.  There is no need for concern and it should clear up in a day or so.  SYMPTOMS TO REPORT IMMEDIATELY:     Following upper endoscopy (EGD)  Vomiting of blood or coffee ground material  New chest pain or pain under the shoulder blades  Painful or persistently difficult swallowing  New shortness of breath  Fever of 100F or higher  Black, tarry-looking stools  For urgent or emergent issues, a gastroenterologist can be reached at any hour by calling [phone].   DIET:  We do recommend a small meal at first, but then you may proceed to your regular diet.  Drink plenty of fluids but you should avoid alcoholic beverages for 24  hours.  ACTIVITY:  You should plan to take it easy for the rest of today and you should NOT DRIVE or use heavy machinery until tomorrow (because of the sedation medicines used during the test).    FOLLOW UP: Our staff will call the number listed on your records 48-72 hours following your procedure to check on you and address any questions or concerns that you may have regarding the information given to you following your procedure. If we do not reach you, we will leave a message.  We will attempt to reach you two times.  During this call, we will ask if you have developed any symptoms of COVID 19. If you develop any symptoms (for example fever, flu-like symptoms, shortness of breath, cough etc.) before then, please call [phone].  If any biopsies were taken you will be contacted by phone or by letter within the next 1-3 weeks.  Please call us at [phone] if you have not heard about the biopsies in 3 weeks.    SIGNATURES/CONFIDENTIALITY: You and/or your care partner have signed paperwork which will be entered into your electronic medical record.  These signatures attest to the fact that that the information above on your After Visit Summary has been reviewed and is understood.  Full responsibility of the confidentiality of this discharge information lies with you and/or your care-partner.

## 2018-09-02 ENCOUNTER — Telehealth: Payer: Self-pay | Admitting: *Deleted

## 2018-09-02 NOTE — Telephone Encounter (Signed)
  Follow up Call-  Call back number 08/31/2018 07/27/2016  Post procedure Call Back phone  # [phone]  Permission to leave phone message Yes Yes  Some recent data might be hidden     Patient questions:  Do you have a fever, pain , or abdominal swelling? no Pain Score  no  Have you tolerated food without any problems?  yes Have you been able to return to your normal activities? yes  Do you have any questions about your discharge instructions: Diet   no Medications  no Follow up visit  No   Do you have questions or concerns about your Care? no  Actions: * If pain score is 4 or above: No action needed  1. Have you developed a fever since your procedure? no  2.   Have you had an respiratory symptoms (SOB or cough) since your procedure? no  3.   Have you tested positive for COVID 19 since your procedure no  3.   Have you had any family members/close contacts diagnosed with the COVID 19 since your procedure?  no   If any of these questions are a yes, please inquire if patient has been seen by family doctor and route this note to Joylene John, Therapist, sports.

## 2018-09-05 ENCOUNTER — Encounter: Payer: Self-pay | Admitting: Family Medicine

## 2018-09-05 ENCOUNTER — Ambulatory Visit (INDEPENDENT_AMBULATORY_CARE_PROVIDER_SITE_OTHER): Admitting: Family Medicine

## 2018-09-05 ENCOUNTER — Other Ambulatory Visit: Payer: Self-pay

## 2018-09-05 DIAGNOSIS — E1100 Type 2 diabetes mellitus with hyperosmolarity without nonketotic hyperglycemic-hyperosmolar coma (NKHHC): Secondary | ICD-10-CM

## 2018-09-05 DIAGNOSIS — D500 Iron deficiency anemia secondary to blood loss (chronic): Secondary | ICD-10-CM | POA: Diagnosis not present

## 2018-09-05 DIAGNOSIS — E559 Vitamin D deficiency, unspecified: Secondary | ICD-10-CM

## 2018-09-05 DIAGNOSIS — M545 Low back pain, unspecified: Secondary | ICD-10-CM | POA: Insufficient documentation

## 2018-09-05 DIAGNOSIS — M549 Dorsalgia, unspecified: Secondary | ICD-10-CM | POA: Insufficient documentation

## 2018-09-05 DIAGNOSIS — I10 Essential (primary) hypertension: Secondary | ICD-10-CM | POA: Diagnosis not present

## 2018-09-05 MED ORDER — GABAPENTIN 300 MG PO CAPS: 300.0000 mg | ORAL_CAPSULE | Freq: Three times a day (TID) | ORAL | 1 refills | Status: DC

## 2018-09-05 NOTE — Assessment & Plan Note (Signed)
hgba1c acceptable, minimize simple carbs. Increase exercise as tolerated.  

## 2018-09-05 NOTE — Assessment & Plan Note (Signed)
Supplement and monitor 

## 2018-09-05 NOTE — Assessment & Plan Note (Signed)
Encouraged moist heat and gentle stretching as tolerated. May try NSAIDs and prescription meds as directed and report if symptoms worsen or seek immediate care. Tramadol keeps him up at night. Will try and increase Gabapentin to 300 mg tid and reassess

## 2018-09-05 NOTE — Assessment & Plan Note (Signed)
no changes to meds. Encouraged heart healthy diet such as the DASH diet and exercise as tolerated.  

## 2018-09-05 NOTE — Assessment & Plan Note (Addendum)
He is improving with iron infusions but due to his many years at [facility] he is attempting to get a bone marrow biopsy done at the [place].

## 2018-09-05 NOTE — Progress Notes (Signed)
Virtual Visit via telephone Note  I connected with Brent [name] on 09/05/18 at  9:20 AM EDT by a telephone enabled telemedicine application and verified that I am speaking with the correct person using two identifiers. Alinda Dooms, CMA attempted to get patient set up on the video platform but patient was unable to complete so visit was completed via telephone  Location: Patient: home Provider: home   I discussed the limitations of evaluation and management by telemedicine and the availability of in person appointments. The patient expressed understanding and agreed to proceed.    Subjective:    Patient ID: Brent [name], male    DOB: 14-Dec-1955, 63 y.o.   MRN: [id number]  Chief Complaint  Patient presents with  . Hospitalization Follow-up    Pt states was in the hospital in March and states tramadol is keeping him at night.     HPI Patient is in today for follow up on chronic medical concerns including chronic pain, anemia, diabetes and more. He feels well today except for his ongoing struggles with pain. He was seen by his orthopaedist Dr Tonita Cong for back and knee pain. A steroid pack provided temporary relief but pain has returned. Tramadol helps pain but causes insomnia. Denies CP/palp/SOB/HA/congestion/fevers/GI or GU c/o. Taking meds as prescribed  Past Medical History:  Diagnosis Date  . Acid reflux disease   . ACID REFLUX DISEASE 07/05/2007  . Acute gastric ulcer with bleeding   . Alcohol abuse   . Anemia   . Atherosclerosis   . Benign neoplasm of colon 07/05/2007  . Bilateral hip pain 10/05/2016  . Breast pain, left 11/17/2011  . CAD (coronary artery disease)   . Chest pain    a. Reportedly negative dobut echo performed prior to gastric bypass in 03/2011;  b. 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 12/2011 Cath: LM nl, LAD 50p, D1 35m LCX min irregs, OM3 30, RCA 25p, 791mFFR 0.99->0.89), PDA 30, EF 65%, Med Rx.  . COLONIC POLYPS, HX OF 10/29/2008  . Diarrhea 06/13/2010   . Diverticulosis 07/05/2018  . DIVERTICULOSIS, COLON 10/29/2008  . DM (diabetes mellitus[place] 2, [place]  . GI bleed   . Gout 03/04/2013  . Hearing loss 05/24/2013   Previous audiology evaluation completely.  . Marland Kitchen[place] (hypertension) 07/14/2010  . Hx of colonic polyps   . HYPERSOMNIA, ASSOCIATED WITH SLEEP APNEA 07/26/2008  . Hypertension 07/14/2010  . 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 Internal hemorrhoids   . Knee pain, left 10/10/2010  . Morbid [place]   a. 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  . Marland [place] pain 03/2015  . Other and unspecified hyperlipidemia 11/16/2012  . Post-operative nausea and vomiting    after the surgery in hospital in March 2020/ past the cauderization  . Preventative health care 11/17/2011  . Renal stone 11/2013  . Sleep apnea    a. CPAP  . Spinal stenosis   . Tear of meniscus of left knee 2012    Past Surgical History:  Procedure Laterality Date  . ANKLE SURGERY Right 1994  . BICEPS TENDON REPAIR     left side  . CARDIAC CATHETERIZATION     denies any chest pain in the past 2 years  . COLONOSCOPY    . colonoscopy polyps    . ESOPHAGOGASTRODUODENOSCOPY N/A 03/27/2013   Procedure: ESOPHAGOGASTRODUODENOSCOPY (EGD);  Surgeon: JoIrene ShipperMD;  [facility];  Service: Endoscopy;  Laterality: N/A;  . ESOPHAGOGASTRODUODENOSCOPY (EGD) WITH PROPOFOL  N/A 07/06/2018   Procedure: ESOPHAGOGASTRODUODENOSCOPY (EGD) WITH PROPOFOL;  Surgeon: Jerene Bears, MD;  [facility];  Service: Gastroenterology;  Laterality: N/A;  . GASTRIC BYPASS    . HERNIA REPAIR    . HOT HEMOSTASIS N/A 07/06/2018   Procedure: HOT HEMOSTASIS (ARGON PLASMA COAGULATION/BICAP);  Surgeon: Jerene Bears, MD;  [facility];  Service: Gastroenterology;  Laterality: N/A;  . KNEE ARTHROSCOPY Left 11/06/2010   Left, torn meniscus (repaired)  . LEFT HEART CATHETERIZATION WITH CORONARY ANGIOGRAM N/A 12/23/2011   Procedure: LEFT HEART CATHETERIZATION WITH  CORONARY ANGIOGRAM;  Surgeon: Minus Breeding, MD;  Location: [facility] CATH LAB;  Service: Cardiovascular;  Laterality: N/A;  . REPLACEMENT TOTAL KNEE Right 01/2016   removed scar tissue/ cut tip of nerve bundle and re-route  . right knee arthroscopy Right 07/05/14   Dr. Hart Robinsons, [place].  [name] ROTATOR CUFF REPAIR  2019   left  . SCHLEROTHERAPY  07/06/2018   Procedure: Woodward Ku;  Surgeon: Jerene Bears, MD;  [facility];  Service: Gastroenterology;;  . TONSILLECTOMY  age 52  . TONSILLECTOMY     as a child  . TOTAL KNEE ARTHROPLASTY Right 01/20/2016   Procedure: RIGHT TOTAL KNEE ARTHROPLASTY;  Surgeon: Paralee Cancel, MD;  [facility];  Service: Orthopedics;  Laterality: Right;  . UPPER GI ENDOSCOPY  03/27/13    Family History  Problem Relation Age of Onset  . Diabetes Mother   . Hypertension Mother   . Stroke Mother   . Hyperlipidemia Mother   . Hypertension Father   . Colon polyps Father   . Heart attack Father 5  . Stroke Father   . Heart attack Brother   . Diabetes Brother   . Heart disease Brother   . Heart attack Brother        Multiple  . Diabetes Brother   . Diabetes Sister   . Stroke Sister   . Obesity Brother   . Diabetes Brother   . Heart disease Brother   . Hypertension Maternal Grandmother   . ADD / ADHD Daughter   . Heart disease Brother   . Stomach cancer Neg Hx   . Colon cancer Neg Hx   . Esophageal cancer Neg Hx   . Rectal cancer Neg Hx     Social History   Socioeconomic History  . Marital status: Married    Spouse name: Not on file  . Number of children: 2  . Years of education: Not on file  . Highest education level: Not on file  Occupational History  . Occupation: Product manager: [employer]  Social Needs  . Financial resource strain: Not on file  . Food insecurity:    Worry: Not on file    Inability: Not on file  . Transportation needs:    Medical: Not on file    Non-medical: Not on file  Tobacco Use   . Smoking status: Former Smoker    Packs/day: 1.50    Years: 20.00    Pack years: 30.00    Types: Cigarettes    Last attempt to quit: 04/21/1991    Years since quitting: 27.3  . Smokeless tobacco: Never Used  Substance and Sexual Activity  . Alcohol use: Yes    Alcohol/week: 3.0 standard drinks    Types: 3 Cans of beer per week    Comment: social  . Drug use: No  . Sexual activity: Not on file  Lifestyle  . Physical activity:  Days per week: Not on file    Minutes per session: Not on file  . Stress: Not on file  Relationships  . Social connections:    Talks on phone: Not on file    Gets together: Not on file    Attends religious service: Not on file    Active member of club or organization: Not on file    Attends meetings of clubs or organizations: Not on file    Relationship status: Not on file  . Intimate partner violence:    Fear of current or ex partner: Not on file    Emotionally abused: Not on file    Physically abused: Not on file    Forced sexual activity: Not on file  Other Topics Concern  . Not on file  Social History Narrative   Lives with wife in [place].  Does not routinely exercise.    Outpatient Medications Prior to Visit  Medication Sig Dispense Refill  . Cholecalciferol (VITAMIN D3 PO) Take 5,000 Units by mouth daily.    . Cyanocobalamin (VITAMIN B-12) 1000 MCG SUBL Place 1 each under the tongue every morning.     [name] EPIPEN 2-PAK 0.3 MG/0.3ML SOAJ injection Inject 0.3 mg into the skin as needed.     . ergocalciferol (VITAMIN D2) 1.25 MG (50000 UT) capsule once a week.     . fenofibrate 160 MG tablet TAKE 1 TABLET DAILY 90 tablet 1  . Ferrous Gluconate-C-Folic Acid (IRON-C PO) Take by mouth. Iron 65 mg and vit C-125 mg -take one daily    . fexofenadine (ALLEGRA) 180 MG tablet Take 180 mg by mouth daily.    . folic acid (FOLVITE) 1 MG tablet Take 1 tablet (1 mg total) by mouth daily. 30 tablet 0  . glucose blood test strip Use as directed once daily  to check blood sugar.  Diagnosis code E11.9 100 each 12  . HYDROcodone-acetaminophen (NORCO) 5-325 MG tablet Take 1 tablet by mouth at bedtime. 30 tablet 0  . LIVALO 1 MG TABS TAKE 1 TABLET DAILY (Patient taking differently: Take 1 mg by mouth daily. ) 90 tablet 4  . Magnesium 100 MG CAPS Take 1 capsule (100 mg total) by mouth daily. 30 capsule 1  . MELATONIN ER PO Take by mouth. Melatonin 12 mg-Take 2 at bedtime.    . metFORMIN (GLUCOPHAGE) 500 MG tablet TAKE 1 TABLET FOUR TIMES A DAY (Patient taking differently: Take 500 mg by mouth 4 (four) times daily. ) 360 tablet 1  . methocarbamol (ROBAXIN) 500 MG tablet TAKE ONE TABLET BY MOUTH EVERY 6 HOURS AS NEEDED FOR MUSCLE SPASMS 120 tablet 5  . metoprolol tartrate (LOPRESSOR) 25 MG tablet TAKE 1 TABLET TWICE A DAY (Patient taking differently: Take 25 mg by mouth 2 (two) times daily. ) 180 tablet 3  . mometasone (ELOCON) 0.1 % cream Apply 1 application topically daily. 50 g 1  . Multiple Vitamin (MULTIVITAMIN) tablet Take 1 tablet by mouth daily. BARIATRIC VIT    . NON FORMULARY CBD oil- take 1000 mg daily    . pantoprazole (PROTONIX) 40 MG tablet Take 1 tablet (40 mg total) by mouth 2 (two) times daily before a meal. 180 tablet 3  . sodium chloride (OCEAN) 0.65 % SOLN nasal spray Place 1 spray into both nostrils as needed for congestion (nose irritation). 30 mL 0  . thiamine 100 MG tablet Take 1 tablet (100 mg total) by mouth daily. 30 tablet 0  . VITAMIN E PO Take  1 tablet by mouth daily.    [name] gabapentin (NEURONTIN) 300 MG capsule TAKE 1 CAPSULE AT BEDTIME 90 capsule 3  . cephALEXin (KEFLEX) 500 MG capsule cephalexin 500 mg capsule    . famotidine (PEPCID) 40 MG tablet famotidine 40 mg tablet    . oxyCODONE (OXY IR/ROXICODONE) 5 MG immediate release tablet oxycodone 5 mg tablet    . Vitamin D, Ergocalciferol, (DRISDOL) 1.25 MG (50000 UT) CAPS capsule TAKE 1 CAPSULE EVERY 7 DAYS (Patient not taking: No sig reported) 12 capsule 4   No  facility-administered medications prior to visit.     Allergies  Allergen Reactions  . Bee Venom Anaphylaxis  . Lipitor [Atorvastatin] Other (See Comments)    Myalgias, memory changes  . Morphine Other (See Comments)    hyperactive  . Hydrocodone Itching  . Pravastatin Other (See Comments)    Made joints hurt. --also simvastatin     Review of Systems  Constitutional: Negative for fever and malaise/fatigue.  HENT: Negative for congestion.   Eyes: Negative for blurred vision.  Respiratory: Negative for shortness of breath.   Cardiovascular: Negative for chest pain, palpitations and leg swelling.  Gastrointestinal: Negative for abdominal pain, blood in stool and nausea.  Genitourinary: Negative for dysuria and frequency.  Musculoskeletal: Positive for back pain, joint pain and neck pain. Negative for falls.  Skin: Negative for rash.  Neurological: Negative for dizziness, loss of consciousness and headaches.  Endo/Heme/Allergies: Negative for environmental allergies.  Psychiatric/Behavioral: Negative for depression. The patient has insomnia. The patient is not nervous/anxious.        Objective:    Physical Exam  Unable to obtain  BP 129/75   Pulse 82   Temp 98 F (36.7 C)   Wt 238 lb (108 kg)   BMI 36.19 kg/m  Wt Readings from Last 3 Encounters:  09/05/18 238 lb (108 kg)  08/31/18 250 lb (113.4 kg)  08/22/18 240 lb (108.9 kg)    Diabetic Foot Exam - Simple   No data filed     Lab Results  Component Value Date   WBC 3.9 (L) 08/22/2018   HGB 12.0 (L) 08/22/2018   HCT 37.8 (L) 08/22/2018   PLT 209 08/22/2018   GLUCOSE 207 (H) 07/14/2018   CHOL 127 06/07/2018   TRIG 113.0 06/07/2018   HDL 53.50 06/07/2018   LDLDIRECT 85.0 11/17/2016   LDLCALC 51 06/07/2018   ALT 11 07/14/2018   AST 15 07/14/2018   NA 140 07/14/2018   K 4.0 07/14/2018   CL 106 07/14/2018   CREATININE 0.95 07/14/2018   BUN 12 07/14/2018   CO2 26 07/14/2018   TSH 3.68 06/07/2018   PSA  1.55 06/25/2017   INR 1.3 (H) 07/06/2018   HGBA1C 6.9 (H) 06/07/2018   MICROALBUR 1.1 12/02/2015    Lab Results  Component Value Date   TSH 3.68 06/07/2018   Lab Results  Component Value Date   WBC 3.9 (L) 08/22/2018   HGB 12.0 (L) 08/22/2018   HCT 37.8 (L) 08/22/2018   MCV 91.7 08/22/2018   PLT 209 08/22/2018   Lab Results  Component Value Date   NA 140 07/14/2018   K 4.0 07/14/2018   CO2 26 07/14/2018   GLUCOSE 207 (H) 07/14/2018   BUN 12 07/14/2018   CREATININE 0.95 07/14/2018   BILITOT 0.4 07/14/2018   ALKPHOS 32 (L) 07/14/2018   AST 15 07/14/2018   ALT 11 07/14/2018   PROT 5.8 (L) 07/14/2018   ALBUMIN 4.1 07/14/2018  CALCIUM 8.4 (L) 07/14/2018   ANIONGAP 8 07/14/2018   GFR 75.55 06/07/2018   Lab Results  Component Value Date   CHOL 127 06/07/2018   Lab Results  Component Value Date   HDL 53.50 06/07/2018   Lab Results  Component Value Date   LDLCALC 51 06/07/2018   Lab Results  Component Value Date   TRIG 113.0 06/07/2018   Lab Results  Component Value Date   CHOLHDL 2 06/07/2018   Lab Results  Component Value Date   HGBA1C 6.9 (H) 06/07/2018       Assessment & Plan:   Problem List Items Addressed This Visit    T2DM (type 2 diabetes mellitus) ([place])    hgba1c acceptable, minimize simple carbs. Increase exercise as tolerated.       Essential hypertension     no changes to meds. Encouraged heart healthy diet such as the DASH diet and exercise as tolerated.       Vitamin D deficiency    Supplement and monitor      IDA (iron deficiency anemia)    He is improving with iron infusions but due to his many years at [facility] he is attempting to get a bone marrow biopsy done at the [place].      Back pain    Encouraged moist heat and gentle stretching as tolerated. May try NSAIDs and prescription meds as directed and report if symptoms worsen or seek immediate care. Tramadol keeps him up at night. Will try and increase Gabapentin to 300 mg  tid and reassess         I have discontinued Brent [name] "Don"'s Vitamin D (Ergocalciferol), oxyCODONE, cephALEXin, and famotidine. I have also changed his gabapentin. Additionally, I am having him maintain his multivitamin, Vitamin B-12, mometasone, glucose blood, metFORMIN, Magnesium, pantoprazole, Livalo, metoprolol tartrate, EpiPen 2-Pak, VITAMIN E PO, folic acid, thiamine, sodium chloride, fenofibrate, HYDROcodone-acetaminophen, ergocalciferol, Cholecalciferol (VITAMIN D3 PO), Ferrous Gluconate-C-Folic Acid (IRON-C PO), MELATONIN ER PO, NON FORMULARY, fexofenadine, and methocarbamol.  Meds ordered this encounter  Medications  . gabapentin (NEURONTIN) 300 MG capsule    Sig: Take 1 capsule (300 mg total) by mouth 3 (three) times daily.    Dispense:  270 capsule    Refill:  1    I discussed the assessment and treatment plan with the patient. The patient was provided an opportunity to ask questions and all were answered. The patient agreed with the plan and demonstrated an understanding of the instructions.   The patient was advised to call back or seek an in-person evaluation if the symptoms worsen or if the condition fails to improve as anticipated.  I provided 25 minutes of non-face-to-face time during this encounter.   [name], MD

## 2018-09-06 ENCOUNTER — Ambulatory Visit: Admitting: Family Medicine

## 2018-09-06 ENCOUNTER — Other Ambulatory Visit: Payer: Self-pay

## 2018-09-06 ENCOUNTER — Inpatient Hospital Stay

## 2018-09-06 VITALS — BP 129/73 | HR 67 | Temp 98.1°F | Resp 16

## 2018-09-06 DIAGNOSIS — K909 Intestinal malabsorption, unspecified: Secondary | ICD-10-CM

## 2018-09-06 DIAGNOSIS — D500 Iron deficiency anemia secondary to blood loss (chronic): Secondary | ICD-10-CM

## 2018-09-06 MED ORDER — SODIUM CHLORIDE 0.9 % IV SOLN
Freq: Once | INTRAVENOUS | Status: AC
  Administered 2018-09-06: 10:00:00 via INTRAVENOUS
  Filled 2018-09-06: qty 250

## 2018-09-06 MED ORDER — SODIUM CHLORIDE 0.9 % IV SOLN
510.0000 mg | Freq: Once | INTRAVENOUS | Status: AC
  Administered 2018-09-06: 510 mg via INTRAVENOUS
  Filled 2018-09-06: qty 510

## 2018-09-06 NOTE — Patient Instructions (Signed)

## 2018-09-10 ENCOUNTER — Encounter: Payer: Self-pay | Admitting: Family Medicine

## 2018-09-13 ENCOUNTER — Other Ambulatory Visit: Payer: Self-pay

## 2018-09-13 NOTE — Telephone Encounter (Signed)
Requesting:hydrocodone  Contract:yes UDS:low risk next screen 11/05/18 Last OV:09/05/18 Next OV:10/24/18 Last Refill:08/10/18  #[id number] Database:   Please advise

## 2018-09-14 MED ORDER — HYDROCODONE-ACETAMINOPHEN 5-325 MG PO TABS: 1.0000 | ORAL_TABLET | Freq: Every day | ORAL | 0 refills | Status: DC

## 2018-09-23 ENCOUNTER — Encounter: Payer: Self-pay | Admitting: Family Medicine

## 2018-09-24 ENCOUNTER — Other Ambulatory Visit: Payer: Self-pay | Admitting: Family Medicine

## 2018-09-24 MED ORDER — ECONAZOLE NITRATE 1 % EX CREA: TOPICAL_CREAM | Freq: Every day | CUTANEOUS | 1 refills | Status: DC

## 2018-09-24 MED ORDER — MOMETASONE FUROATE 0.1 % EX CREA: 1.0000 "application " | TOPICAL_CREAM | Freq: Every day | CUTANEOUS | 1 refills | Status: DC

## 2018-10-12 ENCOUNTER — Other Ambulatory Visit: Payer: Self-pay | Admitting: Family Medicine

## 2018-10-12 ENCOUNTER — Encounter: Payer: Self-pay | Admitting: Family Medicine

## 2018-10-13 MED ORDER — HYDROCODONE-ACETAMINOPHEN 5-325 MG PO TABS: 1.0000 | ORAL_TABLET | Freq: Every day | ORAL | 0 refills | Status: DC

## 2018-10-18 ENCOUNTER — Encounter: Payer: Self-pay | Admitting: Family Medicine

## 2018-10-18 DIAGNOSIS — E1100 Type 2 diabetes mellitus with hyperosmolarity without nonketotic hyperglycemic-hyperosmolar coma (NKHHC): Secondary | ICD-10-CM

## 2018-10-18 DIAGNOSIS — Z79899 Other long term (current) drug therapy: Secondary | ICD-10-CM

## 2018-10-18 DIAGNOSIS — E782 Mixed hyperlipidemia: Secondary | ICD-10-CM

## 2018-10-18 DIAGNOSIS — E559 Vitamin D deficiency, unspecified: Secondary | ICD-10-CM

## 2018-10-18 DIAGNOSIS — I10 Essential (primary) hypertension: Secondary | ICD-10-CM

## 2018-10-20 ENCOUNTER — Other Ambulatory Visit (INDEPENDENT_AMBULATORY_CARE_PROVIDER_SITE_OTHER)

## 2018-10-20 ENCOUNTER — Other Ambulatory Visit: Payer: Self-pay

## 2018-10-20 DIAGNOSIS — E782 Mixed hyperlipidemia: Secondary | ICD-10-CM | POA: Diagnosis not present

## 2018-10-20 DIAGNOSIS — Z79899 Other long term (current) drug therapy: Secondary | ICD-10-CM

## 2018-10-20 DIAGNOSIS — E1100 Type 2 diabetes mellitus with hyperosmolarity without nonketotic hyperglycemic-hyperosmolar coma (NKHHC): Secondary | ICD-10-CM | POA: Diagnosis not present

## 2018-10-20 DIAGNOSIS — E559 Vitamin D deficiency, unspecified: Secondary | ICD-10-CM | POA: Diagnosis not present

## 2018-10-20 DIAGNOSIS — I10 Essential (primary) hypertension: Secondary | ICD-10-CM

## 2018-10-20 LAB — COMPREHENSIVE METABOLIC PANEL
ALT: 14 U/L (ref 0–53)
AST: 20 U/L (ref 0–37)
Albumin: 4.2 g/dL (ref 3.5–5.2)
Alkaline Phosphatase: 36 U/L — ABNORMAL LOW (ref 39–117)
BUN: 15 mg/dL (ref 6–23)
CO2: 25 mEq/L (ref 19–32)
Calcium: 8.7 mg/dL (ref 8.4–10.5)
Chloride: 104 mEq/L (ref 96–112)
Creatinine, Ser: 1.05 mg/dL (ref 0.40–1.50)
GFR: 71.33 mL/min (ref >60.00)
Glucose, Bld: 151 mg/dL — ABNORMAL HIGH (ref 70–99)
Potassium: 4.4 mEq/L (ref 3.5–5.1)
Sodium: 138 mEq/L (ref 135–145)
Total Bilirubin: 0.7 mg/dL (ref 0.2–1.2)
Total Protein: 6.1 g/dL (ref 6.0–8.3)

## 2018-10-20 LAB — LIPID PANEL
Cholesterol: 115 mg/dL (ref 0–200)
HDL: 46.4 mg/dL (ref >39.00)
LDL Cholesterol: 46 mg/dL (ref 0–99)
NonHDL: 68.49
Total CHOL/HDL Ratio: 2
Triglycerides: 114 mg/dL (ref 0.0–149.0)
VLDL: 22.8 mg/dL (ref 0.0–40.0)

## 2018-10-20 LAB — CBC
HCT: 39.2 % (ref 39.0–52.0)
Hemoglobin: 12.8 g/dL — ABNORMAL LOW (ref 13.0–17.0)
MCHC: 32.6 g/dL (ref 30.0–36.0)
MCV: 91.3 fl (ref 78.0–100.0)
Platelets: 170 10*3/uL (ref 150.0–400.0)
RBC: 4.29 Mil/uL (ref 4.22–5.81)
RDW: 15.1 % (ref 11.5–15.5)
WBC: 4.7 10*3/uL (ref 4.0–10.5)

## 2018-10-20 LAB — HEMOGLOBIN A1C: Hgb A1c MFr Bld: 6.4 % (ref 4.6–6.5)

## 2018-10-20 LAB — VITAMIN D 25 HYDROXY (VIT D DEFICIENCY, FRACTURES): VITD: 41.08 ng/mL (ref 30.00–100.00)

## 2018-10-20 LAB — TSH: TSH: 2.45 u[IU]/mL (ref 0.35–4.50)

## 2018-10-23 LAB — PAIN MGMT, PROFILE 8 W/CONF, U
6 Acetylmorphine: NEGATIVE ng/mL
Alcohol Metabolites: POSITIVE ng/mL — AB (ref <500)
Amphetamines: NEGATIVE ng/mL
Benzodiazepines: NEGATIVE ng/mL
Buprenorphine, Urine: NEGATIVE ng/mL
Cocaine Metabolite: NEGATIVE ng/mL
Codeine: NEGATIVE ng/mL
Creatinine: >300 mg/dL
Ethyl Glucuronide (ETG): 26456 ng/mL
Ethyl Sulfate (ETS): 6048 ng/mL
Hydrocodone: 525 ng/mL
Hydromorphone: 778 ng/mL
MDA: NEGATIVE ng/mL
MDMA: NEGATIVE ng/mL
MDMA: NEGATIVE ng/mL
Marijuana Metabolite: NEGATIVE ng/mL
Morphine: NEGATIVE ng/mL
Norhydrocodone: 727 ng/mL
Opiates: POSITIVE ng/mL
Oxidant: NEGATIVE ug/mL
Oxycodone: NEGATIVE ng/mL
pH: 5.2 (ref 4.5–9.0)

## 2018-10-24 ENCOUNTER — Other Ambulatory Visit: Payer: Self-pay

## 2018-10-24 ENCOUNTER — Encounter: Payer: Self-pay | Admitting: Family Medicine

## 2018-10-24 ENCOUNTER — Ambulatory Visit (INDEPENDENT_AMBULATORY_CARE_PROVIDER_SITE_OTHER): Admitting: Family Medicine

## 2018-10-24 VITALS — BP 102/58 | HR 89 | Temp 98.4°F | Resp 18 | Wt 254.8 lb

## 2018-10-24 DIAGNOSIS — M25571 Pain in right ankle and joints of right foot: Secondary | ICD-10-CM

## 2018-10-24 DIAGNOSIS — E782 Mixed hyperlipidemia: Secondary | ICD-10-CM | POA: Diagnosis not present

## 2018-10-24 DIAGNOSIS — E1100 Type 2 diabetes mellitus with hyperosmolarity without nonketotic hyperglycemic-hyperosmolar coma (NKHHC): Secondary | ICD-10-CM

## 2018-10-24 DIAGNOSIS — I10 Essential (primary) hypertension: Secondary | ICD-10-CM

## 2018-10-24 DIAGNOSIS — G8929 Other chronic pain: Secondary | ICD-10-CM

## 2018-10-24 DIAGNOSIS — E559 Vitamin D deficiency, unspecified: Secondary | ICD-10-CM

## 2018-10-24 DIAGNOSIS — E538 Deficiency of other specified B group vitamins: Secondary | ICD-10-CM

## 2018-10-24 DIAGNOSIS — M25572 Pain in left ankle and joints of left foot: Secondary | ICD-10-CM

## 2018-10-24 NOTE — Assessment & Plan Note (Signed)
hgba1c acceptable, minimize simple carbs. Increase exercise as tolerated. Continue current meds 

## 2018-10-24 NOTE — Assessment & Plan Note (Signed)
Well controlled, no changes to meds. Encouraged heart healthy diet such as the DASH diet and exercise as tolerated.  °

## 2018-10-24 NOTE — Assessment & Plan Note (Signed)
Supplement and monitor, no changes

## 2018-10-24 NOTE — Patient Instructions (Signed)
Carbohydrate Counting for Diabetes Mellitus, Adult  Carbohydrate counting is a method of keeping track of how many carbohydrates you eat. Eating carbohydrates naturally increases the amount of sugar (glucose) in the blood. Counting how many carbohydrates you eat helps keep your blood glucose within normal limits, which helps you manage your diabetes (diabetes mellitus). It is important to know how many carbohydrates you can safely have in each meal. This is different for every person. A diet and nutrition specialist (registered dietitian) can help you make a meal plan and calculate how many carbohydrates you should have at each meal and snack. Carbohydrates are found in the following foods:  Grains, such as breads and cereals.  Dried beans and soy products.  Starchy vegetables, such as potatoes, peas, and corn.  Fruit and fruit juices.  Milk and yogurt.  Sweets and snack foods, such as cake, cookies, candy, chips, and soft drinks. How do I count carbohydrates? There are two ways to count carbohydrates in food. You can use either of the methods or a combination of both. Reading "Nutrition Facts" on packaged food The "Nutrition Facts" list is included on the labels of almost all packaged foods and beverages in the [place] It includes:  The serving size.  Information about nutrients in each serving, including the grams (g) of carbohydrate per serving. To use the "Nutrition Facts":  Decide how many servings you will have.  Multiply the number of servings by the number of carbohydrates per serving.  The resulting number is the total amount of carbohydrates that you will be having. Learning standard serving sizes of other foods When you eat carbohydrate foods that are not packaged or do not include "Nutrition Facts" on the label, you need to measure the servings in order to count the amount of carbohydrates:  Measure the foods that you will eat with a food scale or measuring cup, if needed.   Decide how many standard-size servings you will eat.  Multiply the number of servings by 15. Most carbohydrate-rich foods have about 15 g of carbohydrates per serving. ? For example, if you eat 8 oz (170 g) of strawberries, you will have eaten 2 servings and 30 g of carbohydrates (2 servings x 15 g = 30 g).  For foods that have more than one food mixed, such as soups and casseroles, you must count the carbohydrates in each food that is included. The following list contains standard serving sizes of common carbohydrate-rich foods. Each of these servings has about 15 g of carbohydrates:   hamburger bun or  English muffin.   oz (15 mL) syrup.   oz (14 g) jelly.  1 slice of bread.  1 six-inch tortilla.  3 oz (85 g) cooked rice or pasta.  4 oz (113 g) cooked dried beans.  4 oz (113 g) starchy vegetable, such as peas, corn, or potatoes.  4 oz (113 g) hot cereal.  4 oz (113 g) mashed potatoes or  of a large baked potato.  4 oz (113 g) canned or frozen fruit.  4 oz (120 mL) fruit juice.  4-6 crackers.  6 chicken nuggets.  6 oz (170 g) unsweetened dry cereal.  6 oz (170 g) plain fat-free yogurt or yogurt sweetened with artificial sweeteners.  8 oz (240 mL) milk.  8 oz (170 g) fresh fruit or one small piece of fruit.  24 oz (680 g) popped popcorn. Example of carbohydrate counting Sample meal  3 oz (85 g) chicken breast.  6 oz (170 g)   brown rice.  4 oz (113 g) corn.  8 oz (240 mL) milk.  8 oz (170 g) strawberries with sugar-free whipped topping. Carbohydrate calculation 1. Identify the foods that contain carbohydrates: ? Rice. ? Corn. ? Milk. ? Strawberries. 2. Calculate how many servings you have of each food: ? 2 servings rice. ? 1 serving corn. ? 1 serving milk. ? 1 serving strawberries. 3. Multiply each number of servings by 15 g: ? 2 servings rice x 15 g = 30 g. ? 1 serving corn x 15 g = 15 g. ? 1 serving milk x 15 g = 15 g. ? 1 serving  strawberries x 15 g = 15 g. 4. Add together all of the amounts to find the total grams of carbohydrates eaten: ? 30 g + 15 g + 15 g + 15 g = 75 g of carbohydrates total. Summary  Carbohydrate counting is a method of keeping track of how many carbohydrates you eat.  Eating carbohydrates naturally increases the amount of sugar (glucose) in the blood.  Counting how many carbohydrates you eat helps keep your blood glucose within normal limits, which helps you manage your diabetes.  A diet and nutrition specialist (registered dietitian) can help you make a meal plan and calculate how many carbohydrates you should have at each meal and snack. This information is not intended to replace advice given to you by your health care provider. Make sure you discuss any questions you have with your health care provider. Document Released: 04/06/2005 Document Revised: 10/29/2016 Document Reviewed: 09/18/2015 Elsevier Patient Education  2020 [employer] Inc.  

## 2018-10-24 NOTE — Assessment & Plan Note (Signed)
Right ankle MRI recently showed significant dysfunction in the joint and he has been referred to Dr Doran Durand for consideration of ankle replacement. He has nearly debilitating daily pain

## 2018-10-24 NOTE — Progress Notes (Signed)
Subjective:    Patient ID: Brent [name], male    DOB: 1955-12-05, 63 y.o.   MRN: [id number]  No chief complaint on file.   HPI Patient is in today for follow up on chronic medical concerns including diabetes, hypertension, chronic ankle pain. He has recently had an MRI of ankle which showed significant degenerative and dysfunctional changes. He is awaiting a consultation with dr Doran Durand of ortho on 11/04/18 in consideration of ankle replacement. Denies CP/palp/SOB/HA/congestion/fevers/GI or GU c/o. Taking meds as prescribed. Continues to struggle with chronic back pain.   Past Medical History:  Diagnosis Date  . Acid reflux disease   . ACID REFLUX DISEASE 07/05/2007  . Acute gastric ulcer with bleeding   . Alcohol abuse   . Anemia   . Atherosclerosis   . Benign neoplasm of colon 07/05/2007  . Bilateral hip pain 10/05/2016  . Breast pain, left 11/17/2011  . CAD (coronary artery disease)   . Chest pain    a. Reportedly negative dobut echo performed prior to gastric bypass in 03/2011;  b. 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 12/2011 Cath: LM nl, LAD 50p, D1 66m, LCX min irregs, OM3 30, RCA 25p, 5m (FFR 0.99->0.89), PDA 30, EF 65%, Med Rx.  . COLONIC POLYPS, HX OF 10/29/2008  . Diarrhea 06/13/2010  . Diverticulosis 07/05/2018  . DIVERTICULOSIS, COLON 10/29/2008  . DM (diabetes mellitus), type 2, uncontrolled ([place])   . GI bleed   . Gout 03/04/2013  . Hearing loss 05/24/2013   Previous audiology evaluation completely.  [name] HTN (hypertension) 07/14/2010  . Hx of colonic polyps   . HYPERSOMNIA, ASSOCIATED WITH SLEEP APNEA 07/26/2008  . Hypertension 07/14/2010  . 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 Internal hemorrhoids   . Knee pain, left 10/10/2010  . Morbid obesity ([place]) 03/27/2010   a. 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  [name] Neck pain 03/2015  . Other and unspecified hyperlipidemia 11/16/2012  . Post-operative nausea and vomiting    after the surgery in hospital in March 2020/ past the  cauderization  . Preventative health care 11/17/2011  . Renal stone 11/2013  . Sleep apnea    a. CPAP  . Spinal stenosis   . Tear of meniscus of left knee 2012    Past Surgical History:  Procedure Laterality Date  . ANKLE SURGERY Right 1994  . BICEPS TENDON REPAIR     left side  . CARDIAC CATHETERIZATION     denies any chest pain in the past 2 years  . COLONOSCOPY    . colonoscopy polyps    . ESOPHAGOGASTRODUODENOSCOPY N/A 03/27/2013   Procedure: ESOPHAGOGASTRODUODENOSCOPY (EGD);  Surgeon: Irene Shipper, MD;  Location: [facility];  Service: Endoscopy;  Laterality: N/A;  . ESOPHAGOGASTRODUODENOSCOPY (EGD) WITH PROPOFOL N/A 07/06/2018   Procedure: ESOPHAGOGASTRODUODENOSCOPY (EGD) WITH PROPOFOL;  Surgeon: Jerene Bears, MD;  Location: [facility];  Service: Gastroenterology;  Laterality: N/A;  . GASTRIC BYPASS    . HERNIA REPAIR    . HOT HEMOSTASIS N/A 07/06/2018   Procedure: HOT HEMOSTASIS (ARGON PLASMA COAGULATION/BICAP);  Surgeon: Jerene Bears, MD;  Location: [facility];  Service: Gastroenterology;  Laterality: N/A;  . KNEE ARTHROSCOPY Left 11/06/2010   Left, torn meniscus (repaired)  . LEFT HEART CATHETERIZATION WITH CORONARY ANGIOGRAM N/A 12/23/2011   Procedure: LEFT HEART CATHETERIZATION WITH CORONARY ANGIOGRAM;  Surgeon: Minus Breeding, MD;  Location: [facility] CATH LAB;  Service: Cardiovascular;  Laterality: N/A;  . REPLACEMENT TOTAL KNEE Right 01/2016   removed scar  tissue/ cut tip of nerve bundle and re-route  . right knee arthroscopy Right 07/05/14   Dr. Hart Robinsons, [place].  [name] ROTATOR CUFF REPAIR  2019   left  . SCHLEROTHERAPY  07/06/2018   Procedure: Woodward Ku;  Surgeon: Jerene Bears, MD;  Location: [facility];  Service: Gastroenterology;;  . TONSILLECTOMY  age 86  . TONSILLECTOMY     as a child  . TOTAL KNEE ARTHROPLASTY Right 01/20/2016   Procedure: RIGHT TOTAL KNEE ARTHROPLASTY;  Surgeon: Paralee Cancel, MD;  Location: [facility];  Service: Orthopedics;  Laterality:  Right;  . UPPER GI ENDOSCOPY  03/27/13    Family History  Problem Relation Age of Onset  . Diabetes Mother   . Hypertension Mother   . Stroke Mother   . Hyperlipidemia Mother   . Hypertension Father   . Colon polyps Father   . Heart attack Father 43  . Stroke Father   . Heart attack Brother   . Diabetes Brother   . Heart disease Brother   . Heart attack Brother        Multiple  . Diabetes Brother   . Diabetes Sister   . Stroke Sister   . Obesity Brother   . Diabetes Brother   . Heart disease Brother   . Hypertension Maternal Grandmother   . ADD / ADHD Daughter   . Heart disease Brother   . Stomach cancer Neg Hx   . Colon cancer Neg Hx   . Esophageal cancer Neg Hx   . Rectal cancer Neg Hx     Social History   Socioeconomic History  . Marital status: Married    Spouse name: Not on file  . Number of children: 2  . Years of education: Not on file  . Highest education level: Not on file  Occupational History  . Occupation: Product manager: [employer]  Social Needs  . Financial resource strain: Not on file  . Food insecurity    Worry: Not on file    Inability: Not on file  . Transportation needs    Medical: Not on file    Non-medical: Not on file  Tobacco Use  . Smoking status: Former Smoker    Packs/day: 1.50    Years: 20.00    Pack years: 30.00    Types: Cigarettes    Quit date: 04/21/1991    Years since quitting: 27.5  . Smokeless tobacco: Never Used  Substance and Sexual Activity  . Alcohol use: Yes    Alcohol/week: 3.0 standard drinks    Types: 3 Cans of beer per week    Comment: social  . Drug use: No  . Sexual activity: Not on file  Lifestyle  . Physical activity    Days per week: Not on file    Minutes per session: Not on file  . Stress: Not on file  Relationships  . Social Herbalist on phone: Not on file    Gets together: Not on file    Attends religious service: Not on file    Active member of club or  organization: Not on file    Attends meetings of clubs or organizations: Not on file    Relationship status: Not on file  . Intimate partner violence    Fear of current or ex partner: Not on file    Emotionally abused: Not on file    Physically abused: Not on file    Forced sexual activity:  Not on file  Other Topics Concern  . Not on file  Social History Narrative   Lives with wife in [place].  Does not routinely exercise.    Outpatient Medications Prior to Visit  Medication Sig Dispense Refill  . Cholecalciferol (VITAMIN D3 PO) Take 5,000 Units by mouth daily.    . Cyanocobalamin (VITAMIN B-12) 1000 MCG SUBL Place 1 each under the tongue every morning.     [name] econazole nitrate 1 % cream Apply topically daily. 30 g 1  . EPIPEN 2-PAK 0.3 MG/0.3ML SOAJ injection Inject 0.3 mg into the skin as needed.     . ergocalciferol (VITAMIN D2) 1.25 MG (50000 UT) capsule once a week.     . fenofibrate 160 MG tablet TAKE 1 TABLET DAILY 90 tablet 1  . Ferrous Gluconate-C-Folic Acid (IRON-C PO) Take by mouth. Iron 65 mg and vit C-125 mg -take one daily    . fexofenadine (ALLEGRA) 180 MG tablet Take 180 mg by mouth daily.    . folic acid (FOLVITE) 1 MG tablet Take 1 tablet (1 mg total) by mouth daily. 30 tablet 0  . gabapentin (NEURONTIN) 300 MG capsule Take 1 capsule (300 mg total) by mouth 3 (three) times daily. 270 capsule 1  . glucose blood test strip Use as directed once daily to check blood sugar.  Diagnosis code E11.9 100 each 12  . HYDROcodone-acetaminophen (NORCO) 5-325 MG tablet Take 1 tablet by mouth at bedtime. 30 tablet 0  . LIVALO 1 MG TABS TAKE 1 TABLET DAILY (Patient taking differently: Take 1 mg by mouth daily. ) 90 tablet 4  . Magnesium 100 MG CAPS Take 1 capsule (100 mg total) by mouth daily. 30 capsule 1  . MELATONIN ER PO Take by mouth. Melatonin 12 mg-Take 2 at bedtime.    . metFORMIN (GLUCOPHAGE) 500 MG tablet TAKE 1 TABLET FOUR TIMES A DAY (Patient taking differently: Take 500  mg by mouth 4 (four) times daily. ) 360 tablet 1  . methocarbamol (ROBAXIN) 500 MG tablet TAKE ONE TABLET BY MOUTH EVERY 6 HOURS AS NEEDED FOR MUSCLE SPASMS 120 tablet 5  . metoprolol tartrate (LOPRESSOR) 25 MG tablet TAKE 1 TABLET TWICE A DAY (Patient taking differently: Take 25 mg by mouth 2 (two) times daily. ) 180 tablet 3  . mometasone (ELOCON) 0.1 % cream Apply 1 application topically daily. 50 g 1  . Multiple Vitamin (MULTIVITAMIN) tablet Take 1 tablet by mouth daily. BARIATRIC VIT    . NON FORMULARY CBD oil- take 1000 mg daily    . pantoprazole (PROTONIX) 40 MG tablet Take 1 tablet (40 mg total) by mouth 2 (two) times daily before a meal. 180 tablet 3  . sodium chloride (OCEAN) 0.65 % SOLN nasal spray Place 1 spray into both nostrils as needed for congestion (nose irritation). 30 mL 0  . thiamine 100 MG tablet Take 1 tablet (100 mg total) by mouth daily. 30 tablet 0  . VITAMIN E PO Take 1 tablet by mouth daily.     No facility-administered medications prior to visit.     Allergies  Allergen Reactions  . Bee Venom Anaphylaxis  . Lipitor [Atorvastatin] Other (See Comments)    Myalgias, memory changes  . Morphine Other (See Comments)    hyperactive  . Hydrocodone Itching  . Pravastatin Other (See Comments)    Made joints hurt. --also simvastatin     Review of Systems  Constitutional: Positive for malaise/fatigue. Negative for fever.  HENT: Negative for congestion.  Eyes: Negative for blurred vision.  Respiratory: Negative for shortness of breath.   Cardiovascular: Negative for chest pain, palpitations and leg swelling.  Gastrointestinal: Negative for abdominal pain, blood in stool and nausea.  Genitourinary: Negative for dysuria and frequency.  Musculoskeletal: Positive for back pain and joint pain. Negative for falls.  Skin: Negative for rash.  Neurological: Negative for dizziness, loss of consciousness and headaches.  Endo/Heme/Allergies: Negative for environmental  allergies.  Psychiatric/Behavioral: Negative for depression. The patient is not nervous/anxious.        Objective:    Physical Exam Vitals signs and nursing note reviewed.  Constitutional:      General: He is not in acute distress.    Appearance: He is well-developed.  HENT:     Head: Normocephalic and atraumatic.     Nose: Nose normal.  Eyes:     General:        Right eye: No discharge.        Left eye: No discharge.  Neck:     Musculoskeletal: Normal range of motion and neck supple.  Cardiovascular:     Rate and Rhythm: Normal rate and regular rhythm.     Heart sounds: No murmur.  Pulmonary:     Effort: Pulmonary effort is normal.     Breath sounds: Normal breath sounds.  Abdominal:     General: Bowel sounds are normal.     Palpations: Abdomen is soft.     Tenderness: There is no abdominal tenderness.  Skin:    General: Skin is warm and dry.  Neurological:     Mental Status: He is alert and oriented to person, place, and time.     BP (!) 102/58 (BP Location: Left Arm, Patient Position: Sitting, Cuff Size: Normal)   Pulse 89   Temp 98.4 F (36.9 C) (Oral)   Resp 18   Wt 254 lb 12.8 oz (115.6 kg)   SpO2 97%   BMI 38.74 kg/m  Wt Readings from Last 3 Encounters:  10/24/18 254 lb 12.8 oz (115.6 kg)  09/05/18 238 lb (108 kg)  08/31/18 250 lb (113.4 kg)    Diabetic Foot Exam - Simple   No data filed     Lab Results  Component Value Date   WBC 4.7 10/20/2018   HGB 12.8 (L) 10/20/2018   HCT 39.2 10/20/2018   PLT 170.0 10/20/2018   GLUCOSE 151 (H) 10/20/2018   CHOL 115 10/20/2018   TRIG 114.0 10/20/2018   HDL 46.40 10/20/2018   LDLDIRECT 85.0 11/17/2016   LDLCALC 46 10/20/2018   ALT 14 10/20/2018   AST 20 10/20/2018   NA 138 10/20/2018   K 4.4 10/20/2018   CL 104 10/20/2018   CREATININE 1.05 10/20/2018   BUN 15 10/20/2018   CO2 25 10/20/2018   TSH 2.45 10/20/2018   PSA 1.55 06/25/2017   INR 1.3 (H) 07/06/2018   HGBA1C 6.4 10/20/2018    MICROALBUR 1.1 12/02/2015    Lab Results  Component Value Date   TSH 2.45 10/20/2018   Lab Results  Component Value Date   WBC 4.7 10/20/2018   HGB 12.8 (L) 10/20/2018   HCT 39.2 10/20/2018   MCV 91.3 10/20/2018   PLT 170.0 10/20/2018   Lab Results  Component Value Date   NA 138 10/20/2018   K 4.4 10/20/2018   CO2 25 10/20/2018   GLUCOSE 151 (H) 10/20/2018   BUN 15 10/20/2018   CREATININE 1.05 10/20/2018   BILITOT 0.7 10/20/2018   ALKPHOS  36 (L) 10/20/2018   AST 20 10/20/2018   ALT 14 10/20/2018   PROT 6.1 10/20/2018   ALBUMIN 4.2 10/20/2018   CALCIUM 8.7 10/20/2018   ANIONGAP 8 07/14/2018   GFR 71.33 10/20/2018   Lab Results  Component Value Date   CHOL 115 10/20/2018   Lab Results  Component Value Date   HDL 46.40 10/20/2018   Lab Results  Component Value Date   LDLCALC 46 10/20/2018   Lab Results  Component Value Date   TRIG 114.0 10/20/2018   Lab Results  Component Value Date   CHOLHDL 2 10/20/2018   Lab Results  Component Value Date   HGBA1C 6.4 10/20/2018       Assessment & Plan:   Problem List Items Addressed This Visit    T2DM (type 2 diabetes mellitus) ([place])    hgba1c acceptable, minimize simple carbs. Increase exercise as tolerated. Continue current meds      Relevant Orders   Hemoglobin A1c   Essential hypertension    Well controlled, no changes to meds. Encouraged heart healthy diet such as the DASH diet and exercise as tolerated.       Relevant Orders   CBC   Comprehensive metabolic panel   TSH   Hyperlipidemia, mixed   Relevant Orders   Lipid panel   Vitamin D deficiency - Primary    Supplement and monitor, no changes      Relevant Orders   VITAMIN D 25 Hydroxy (Vit-D Deficiency, Fractures)   Chronic pain of both ankles    Right ankle MRI recently showed significant dysfunction in the joint and he has been referred to Dr Doran Durand for consideration of ankle replacement. He has nearly debilitating daily pain       Vitamin B12 deficiency   Relevant Orders   Vitamin B12      I am having Brent [name] "Brent [name]" maintain his multivitamin, Vitamin B-12, glucose blood, metFORMIN, Magnesium, pantoprazole, Livalo, metoprolol tartrate, EpiPen 2-Pak, VITAMIN E PO, folic acid, thiamine, sodium chloride, fenofibrate, ergocalciferol, Cholecalciferol (VITAMIN D3 PO), Ferrous Gluconate-C-Folic Acid (IRON-C PO), MELATONIN ER PO, NON FORMULARY, fexofenadine, methocarbamol, gabapentin, mometasone, econazole nitrate, and HYDROcodone-acetaminophen.  No orders of the defined types were placed in this encounter.    [name], MD

## 2018-11-08 ENCOUNTER — Other Ambulatory Visit: Payer: Self-pay | Admitting: Family Medicine

## 2018-11-09 ENCOUNTER — Encounter: Payer: Self-pay | Admitting: Family Medicine

## 2018-11-10 ENCOUNTER — Other Ambulatory Visit: Payer: Self-pay | Admitting: Family Medicine

## 2018-11-10 MED ORDER — HYDROCODONE-ACETAMINOPHEN 5-325 MG PO TABS: 1.0000 | ORAL_TABLET | Freq: Every day | ORAL | 0 refills | Status: DC

## 2018-11-10 NOTE — Telephone Encounter (Signed)
Last hydrocodone RX: 10/13/18, #30 Last OV: 10/24/18 Next OV: 01/24/19 UDS: 10/20/18, low risk repeat in 6 months CSC: Not on file

## 2018-11-22 ENCOUNTER — Other Ambulatory Visit

## 2018-11-22 ENCOUNTER — Inpatient Hospital Stay: Attending: Hematology & Oncology

## 2018-11-22 ENCOUNTER — Inpatient Hospital Stay (HOSPITAL_BASED_OUTPATIENT_CLINIC_OR_DEPARTMENT_OTHER): Admitting: Family

## 2018-11-22 ENCOUNTER — Ambulatory Visit: Admitting: Hematology & Oncology

## 2018-11-22 ENCOUNTER — Ambulatory Visit: Admitting: Family

## 2018-11-22 ENCOUNTER — Other Ambulatory Visit: Payer: Self-pay

## 2018-11-22 VITALS — BP 132/70 | HR 70 | Temp 97.7°F | Resp 17 | Ht 68.0 in | Wt 260.1 lb

## 2018-11-22 DIAGNOSIS — Z7984 Long term (current) use of oral hypoglycemic drugs: Secondary | ICD-10-CM | POA: Insufficient documentation

## 2018-11-22 DIAGNOSIS — Z8719 Personal history of other diseases of the digestive system: Secondary | ICD-10-CM | POA: Diagnosis not present

## 2018-11-22 DIAGNOSIS — D500 Iron deficiency anemia secondary to blood loss (chronic): Secondary | ICD-10-CM | POA: Diagnosis not present

## 2018-11-22 DIAGNOSIS — R202 Paresthesia of skin: Secondary | ICD-10-CM | POA: Insufficient documentation

## 2018-11-22 DIAGNOSIS — Z8711 Personal history of peptic ulcer disease: Secondary | ICD-10-CM | POA: Diagnosis not present

## 2018-11-22 DIAGNOSIS — Z79899 Other long term (current) drug therapy: Secondary | ICD-10-CM | POA: Insufficient documentation

## 2018-11-22 DIAGNOSIS — R200 Anesthesia of skin: Secondary | ICD-10-CM | POA: Diagnosis not present

## 2018-11-22 DIAGNOSIS — K909 Intestinal malabsorption, unspecified: Secondary | ICD-10-CM | POA: Insufficient documentation

## 2018-11-22 DIAGNOSIS — R5383 Other fatigue: Secondary | ICD-10-CM | POA: Diagnosis not present

## 2018-11-22 DIAGNOSIS — Z9884 Bariatric surgery status: Secondary | ICD-10-CM | POA: Diagnosis not present

## 2018-11-22 LAB — CBC WITH DIFFERENTIAL (CANCER CENTER ONLY)
Abs Immature Granulocytes: 0.02 10*3/uL (ref 0.00–0.07)
Basophils Absolute: 0 10*3/uL (ref 0.0–0.1)
Basophils Relative: 1 %
Eosinophils Absolute: 0.2 10*3/uL (ref 0.0–0.5)
Eosinophils Relative: 4 %
HCT: 38 % — ABNORMAL LOW (ref 39.0–52.0)
Hemoglobin: 12.5 g/dL — ABNORMAL LOW (ref 13.0–17.0)
Immature Granulocytes: 0 %
Lymphocytes Relative: 22 %
Lymphs Abs: 1.1 10*3/uL (ref 0.7–4.0)
MCH: 30.2 pg (ref 26.0–34.0)
MCHC: 32.9 g/dL (ref 30.0–36.0)
MCV: 91.8 fL (ref 80.0–100.0)
Monocytes Absolute: 0.6 10*3/uL (ref 0.1–1.0)
Monocytes Relative: 12 %
Neutro Abs: 2.9 10*3/uL (ref 1.7–7.7)
Neutrophils Relative %: 61 %
Platelet Count: 182 10*3/uL (ref 150–400)
RBC: 4.14 MIL/uL — ABNORMAL LOW (ref 4.22–5.81)
RDW: 13.2 % (ref 11.5–15.5)
WBC Count: 4.7 10*3/uL (ref 4.0–10.5)
nRBC: 0 % (ref 0.0–0.2)

## 2018-11-22 LAB — RETICULOCYTES
Immature Retic Fract: 13 % (ref 2.3–15.9)
RBC.: 4.12 MIL/uL — ABNORMAL LOW (ref 4.22–5.81)
Retic Count, Absolute: 89.4 10*3/uL (ref 19.0–186.0)
Retic Ct Pct: 2.2 % (ref 0.4–3.1)

## 2018-11-22 NOTE — Progress Notes (Signed)
Hematology and Oncology Follow Up Visit  Brent [name] [id number] 1956-02-23 63 y.o. 11/22/2018   Principle Diagnosis:  Iron deficiency anemia secondary to malabsorption and history of acute GI bleed  Current Therapy:   IV iron as indicated    Interim History:  Brent [name] is here today for follow-up. He is ding well but still notes some occasional fatigue at times.  His repeat endoscopy in May with Dr. Henrene Pastor was unremarkable.  He also had a bone marrow biopsy in July which showed no evidence of malignancy. He states that he had some back issues after his procedure and did a steroid taper. This caused some weight gain which he is now working to lose.  He is eating well and staying hydrated.  He has been walking and working out in his yard for exercise.  He has not noted any blood loss. No abnormal bruising or petechiae.  No fever, chills, n/v, cough, rash, dizziness, SOB, chest pain, palpitations, abdominal pain or changes in bowel or bladder habits.  He has swelling in his right knee and ankle since having a knee replacement. This comes and goes. He also has numbness and tingling in his right hands due to spinal stenosis. This is unchanged.   ECOG Performance Status: 1 - Symptomatic but completely ambulatory  Medications:  Allergies as of 11/22/2018      Reactions   Bee Venom Anaphylaxis   Lipitor [atorvastatin] Other (See Comments)   Myalgias, memory changes   Morphine Other (See Comments)   hyperactive   Hydrocodone Itching   Pravastatin Other (See Comments)   Made joints hurt. --also simvastatin       Medication List       Accurate as of November 22, 2018  2:24 PM. If you have any questions, ask your nurse or doctor.        econazole nitrate 1 % cream Apply topically daily.   EpiPen 2-Pak 0.3 mg/0.3 mL Soaj injection Generic drug: EPINEPHrine Inject 0.3 mg into the skin as needed.   ergocalciferol 1.25 MG (50000 UT) capsule Commonly known as: VITAMIN D2 once a  week.   fenofibrate 160 MG tablet TAKE 1 TABLET DAILY   fexofenadine 180 MG tablet Commonly known as: ALLEGRA Take 180 mg by mouth daily.   folic acid 1 MG tablet Commonly known as: FOLVITE Take 1 tablet (1 mg total) by mouth daily.   gabapentin 300 MG capsule Commonly known as: NEURONTIN Take 1 capsule (300 mg total) by mouth 3 (three) times daily.   glucose blood test strip Use as directed once daily to check blood sugar.  Diagnosis code E11.9   HYDROcodone-acetaminophen 5-325 MG tablet Commonly known as: Norco Take 1 tablet by mouth at bedtime.   IRON-C PO Take by mouth. Iron 65 mg and vit C-125 mg -take one daily   Livalo 1 MG Tabs Generic drug: Pitavastatin Calcium TAKE 1 TABLET DAILY What changed: how much to take   Magnesium 100 MG Caps Take 1 capsule (100 mg total) by mouth daily.   MELATONIN ER PO Take by mouth. Melatonin 12 mg-Take 2 at bedtime.   metFORMIN 500 MG tablet Commonly known as: GLUCOPHAGE TAKE 1 TABLET FOUR TIMES A DAY   methocarbamol 500 MG tablet Commonly known as: ROBAXIN TAKE ONE TABLET BY MOUTH EVERY 6 HOURS AS NEEDED FOR MUSCLE SPASMS   metoprolol tartrate 25 MG tablet Commonly known as: LOPRESSOR TAKE 1 TABLET TWICE A DAY   mometasone 0.1 % cream Commonly known as:  Elocon Apply 1 application topically daily.   multivitamin tablet Take 1 tablet by mouth daily. BARIATRIC VIT   NON FORMULARY CBD oil- take 1000 mg daily   pantoprazole 40 MG tablet Commonly known as: PROTONIX TAKE 1 TABLET TWICE A DAY BEFORE MEALS   sodium chloride 0.65 % Soln nasal spray Commonly known as: OCEAN Place 1 spray into both nostrils as needed for congestion (nose irritation).   thiamine 100 MG tablet Take 1 tablet (100 mg total) by mouth daily.   Vitamin B-12 1000 MCG Subl Place 1 each under the tongue every morning.   VITAMIN D3 PO Take 5,000 Units by mouth daily.   VITAMIN E PO Take 1 tablet by mouth daily.       Allergies:   Allergies  Allergen Reactions  . Bee Venom Anaphylaxis  . Lipitor [Atorvastatin] Other (See Comments)    Myalgias, memory changes  . Morphine Other (See Comments)    hyperactive  . Hydrocodone Itching  . Pravastatin Other (See Comments)    Made joints hurt. --also simvastatin     Past Medical History, Surgical history, Social history, and Family History were reviewed and updated.  Review of Systems: All other 10 point review of systems is negative.   Physical Exam:  vitals were not taken for this visit.   Wt Readings from Last 3 Encounters:  10/24/18 254 lb 12.8 oz (115.6 kg)  09/05/18 238 lb (108 kg)  08/31/18 250 lb (113.4 kg)    Ocular: Sclerae unicteric, pupils equal, round and reactive to light Ear-nose-throat: Oropharynx clear, dentition fair Lymphatic: No cervical or supraclavicular adenopathy Lungs no rales or rhonchi, good excursion bilaterally Heart regular rate and rhythm, no murmur appreciated Abd soft, nontender, positive bowel sounds, no liver or spleen tip palpated on exam, no fluid wave  MSK no focal spinal tenderness, no joint edema Neuro: non-focal, well-oriented, appropriate affect Breasts: Deferred   Lab Results  Component Value Date   WBC 4.7 11/22/2018   HGB 12.5 (L) 11/22/2018   HCT 38.0 (L) 11/22/2018   MCV 91.8 11/22/2018   PLT 182 11/22/2018   Lab Results  Component Value Date   FERRITIN 23 (L) 08/22/2018   IRON 70 08/22/2018   TIBC 438 (H) 08/22/2018   UIBC 368 08/22/2018   IRONPCTSAT 16 (L) 08/22/2018   Lab Results  Component Value Date   RETICCTPCT 2.2 11/22/2018   RBC 4.12 (L) 11/22/2018   No results found for: KPAFRELGTCHN, LAMBDASER, KAPLAMBRATIO No results found for: IGGSERUM, IGA, IGMSERUM No results found for: Odetta Pink, SPEI   Chemistry      Component Value Date/Time   NA 138 10/20/2018 0801   K 4.4 10/20/2018 0801   CL 104 10/20/2018 0801   CO2 25 10/20/2018  0801   BUN 15 10/20/2018 0801   CREATININE 1.05 10/20/2018 0801   CREATININE 0.95 07/14/2018 1021   CREATININE 0.97 11/21/2013 0948      Component Value Date/Time   CALCIUM 8.7 10/20/2018 0801   ALKPHOS 36 (L) 10/20/2018 0801   AST 20 10/20/2018 0801   AST 15 07/14/2018 1021   ALT 14 10/20/2018 0801   ALT 11 07/14/2018 1021   BILITOT 0.7 10/20/2018 0801   BILITOT 0.4 07/14/2018 1021       Impression and Plan: Brent [name] is a very pleasant 63 yo caucasian gentleman with iron deficiency anemia secondary to malabsorption (gastric bypass) as well intermittent GI blood loss with ulcer.  We  will see what his iron studies show and bring him back in for infusion if needed.  We will plan to see him back in another 4 months.  He will contact our office with any questions or concerns. We can certainly see him sooner if needed.   Laverna Peace, NP 8/4/20202:24 PM

## 2018-11-23 ENCOUNTER — Telehealth: Payer: Self-pay | Admitting: Family

## 2018-11-23 LAB — IRON AND TIBC
Iron: 63 ug/dL (ref 42–163)
Saturation Ratios: 16 % — ABNORMAL LOW (ref 20–55)
TIBC: 388 ug/dL (ref 202–409)
UIBC: 325 ug/dL (ref 117–376)

## 2018-11-23 LAB — FERRITIN: Ferritin: 38 ng/mL (ref 24–336)

## 2018-11-23 NOTE — Telephone Encounter (Signed)
lmom to inform patient of iron appt 8/13 at 1:30 pm and follow up 11/3 at 10:15 per 8/5 LOS

## 2018-12-01 ENCOUNTER — Inpatient Hospital Stay

## 2018-12-01 ENCOUNTER — Encounter: Payer: Self-pay | Admitting: Family Medicine

## 2018-12-01 ENCOUNTER — Other Ambulatory Visit: Payer: Self-pay

## 2018-12-01 VITALS — BP 139/90 | HR 87 | Temp 97.8°F | Resp 18

## 2018-12-01 DIAGNOSIS — K909 Intestinal malabsorption, unspecified: Secondary | ICD-10-CM

## 2018-12-01 DIAGNOSIS — D500 Iron deficiency anemia secondary to blood loss (chronic): Secondary | ICD-10-CM | POA: Diagnosis not present

## 2018-12-01 MED ORDER — SODIUM CHLORIDE 0.9 % IV SOLN
510.0000 mg | Freq: Once | INTRAVENOUS | Status: AC
  Administered 2018-12-01: 510 mg via INTRAVENOUS
  Filled 2018-12-01: qty 510

## 2018-12-01 MED ORDER — SODIUM CHLORIDE 0.9 % IV SOLN
INTRAVENOUS | Status: DC
  Administered 2018-12-01: 14:00:00 via INTRAVENOUS
  Filled 2018-12-01: qty 250

## 2018-12-09 ENCOUNTER — Encounter: Payer: Self-pay | Admitting: Family Medicine

## 2018-12-09 ENCOUNTER — Other Ambulatory Visit: Payer: Self-pay | Admitting: Family Medicine

## 2018-12-09 MED ORDER — HYDROCODONE-ACETAMINOPHEN 5-325 MG PO TABS: 1.0000 | ORAL_TABLET | Freq: Every day | ORAL | 0 refills | Status: DC

## 2018-12-21 LAB — HM DIABETES EYE EXAM

## 2018-12-22 ENCOUNTER — Telehealth: Payer: Self-pay | Admitting: *Deleted

## 2018-12-22 NOTE — Telephone Encounter (Signed)
Copied from [place] [phone]. Topic: General - Other >> Dec 22, 2018  2:47 PM Alanda Slim E wrote: Reason for CRM: FYI - Dr. [name] will be sending over the Pts diabetic eye exam report momentarily.

## 2018-12-27 NOTE — Telephone Encounter (Signed)
NOTED will abstract once received

## 2019-01-01 ENCOUNTER — Encounter: Payer: Self-pay | Admitting: Family Medicine

## 2019-01-02 ENCOUNTER — Other Ambulatory Visit: Payer: Self-pay | Admitting: Family Medicine

## 2019-01-02 MED ORDER — METOPROLOL TARTRATE 50 MG PO TABS: 50.0000 mg | ORAL_TABLET | Freq: Two times a day (BID) | ORAL | 1 refills | Status: DC

## 2019-01-03 ENCOUNTER — Other Ambulatory Visit: Payer: Self-pay | Admitting: Family Medicine

## 2019-01-03 MED ORDER — HYDROCODONE-ACETAMINOPHEN 5-325 MG PO TABS: 1.0000 | ORAL_TABLET | Freq: Every day | ORAL | 0 refills | Status: DC

## 2019-01-05 ENCOUNTER — Other Ambulatory Visit: Payer: Self-pay | Admitting: Family Medicine

## 2019-01-06 NOTE — Telephone Encounter (Signed)
LM with instructions on new Metoprolol prescription. Advised to call with questions.

## 2019-01-15 ENCOUNTER — Encounter: Payer: Self-pay | Admitting: Family Medicine

## 2019-01-20 ENCOUNTER — Ambulatory Visit (INDEPENDENT_AMBULATORY_CARE_PROVIDER_SITE_OTHER)

## 2019-01-20 ENCOUNTER — Other Ambulatory Visit: Payer: Self-pay

## 2019-01-20 ENCOUNTER — Other Ambulatory Visit (INDEPENDENT_AMBULATORY_CARE_PROVIDER_SITE_OTHER)

## 2019-01-20 DIAGNOSIS — E1100 Type 2 diabetes mellitus with hyperosmolarity without nonketotic hyperglycemic-hyperosmolar coma (NKHHC): Secondary | ICD-10-CM | POA: Diagnosis not present

## 2019-01-20 DIAGNOSIS — Z23 Encounter for immunization: Secondary | ICD-10-CM

## 2019-01-20 DIAGNOSIS — I10 Essential (primary) hypertension: Secondary | ICD-10-CM | POA: Diagnosis not present

## 2019-01-20 DIAGNOSIS — E559 Vitamin D deficiency, unspecified: Secondary | ICD-10-CM | POA: Diagnosis not present

## 2019-01-20 DIAGNOSIS — E538 Deficiency of other specified B group vitamins: Secondary | ICD-10-CM | POA: Diagnosis not present

## 2019-01-20 DIAGNOSIS — E782 Mixed hyperlipidemia: Secondary | ICD-10-CM

## 2019-01-20 LAB — CBC
HCT: 41.4 % (ref 39.0–52.0)
Hemoglobin: 14 g/dL (ref 13.0–17.0)
MCHC: 33.8 g/dL (ref 30.0–36.0)
MCV: 91.7 fl (ref 78.0–100.0)
Platelets: 163 10*3/uL (ref 150.0–400.0)
RBC: 4.52 Mil/uL (ref 4.22–5.81)
RDW: 13.4 % (ref 11.5–15.5)
WBC: 4 10*3/uL (ref 4.0–10.5)

## 2019-01-20 LAB — COMPREHENSIVE METABOLIC PANEL
ALT: 17 U/L (ref 0–53)
AST: 20 U/L (ref 0–37)
Albumin: 4.6 g/dL (ref 3.5–5.2)
Alkaline Phosphatase: 40 U/L (ref 39–117)
BUN: 16 mg/dL (ref 6–23)
CO2: 28 mEq/L (ref 19–32)
Calcium: 9.8 mg/dL (ref 8.4–10.5)
Chloride: 102 mEq/L (ref 96–112)
Creatinine, Ser: 1.04 mg/dL (ref 0.40–1.50)
GFR: 72.06 mL/min (ref >60.00)
Glucose, Bld: 196 mg/dL — ABNORMAL HIGH (ref 70–99)
Potassium: 4.1 mEq/L (ref 3.5–5.1)
Sodium: 139 mEq/L (ref 135–145)
Total Bilirubin: 0.9 mg/dL (ref 0.2–1.2)
Total Protein: 6.6 g/dL (ref 6.0–8.3)

## 2019-01-20 LAB — LIPID PANEL
Cholesterol: 130 mg/dL (ref 0–200)
HDL: 39.7 mg/dL (ref >39.00)
LDL Cholesterol: 61 mg/dL (ref 0–99)
NonHDL: 90.57
Total CHOL/HDL Ratio: 3
Triglycerides: 146 mg/dL (ref 0.0–149.0)
VLDL: 29.2 mg/dL (ref 0.0–40.0)

## 2019-01-20 LAB — TSH: TSH: 1.84 u[IU]/mL (ref 0.35–4.50)

## 2019-01-20 LAB — VITAMIN D 25 HYDROXY (VIT D DEFICIENCY, FRACTURES): VITD: 57.89 ng/mL (ref 30.00–100.00)

## 2019-01-20 LAB — VITAMIN B12: Vitamin B-12: >1500 pg/mL — ABNORMAL HIGH (ref 211–911)

## 2019-01-20 LAB — HEMOGLOBIN A1C: Hgb A1c MFr Bld: 6.5 % (ref 4.6–6.5)

## 2019-01-24 ENCOUNTER — Other Ambulatory Visit: Payer: Self-pay

## 2019-01-24 ENCOUNTER — Ambulatory Visit (INDEPENDENT_AMBULATORY_CARE_PROVIDER_SITE_OTHER): Admitting: Family Medicine

## 2019-01-24 ENCOUNTER — Encounter: Payer: Self-pay | Admitting: Family Medicine

## 2019-01-24 VITALS — BP 132/77 | HR 83 | Temp 97.2°F

## 2019-01-24 DIAGNOSIS — M25571 Pain in right ankle and joints of right foot: Secondary | ICD-10-CM

## 2019-01-24 DIAGNOSIS — E782 Mixed hyperlipidemia: Secondary | ICD-10-CM | POA: Diagnosis not present

## 2019-01-24 DIAGNOSIS — D500 Iron deficiency anemia secondary to blood loss (chronic): Secondary | ICD-10-CM

## 2019-01-24 DIAGNOSIS — M545 Low back pain, unspecified: Secondary | ICD-10-CM

## 2019-01-24 DIAGNOSIS — E538 Deficiency of other specified B group vitamins: Secondary | ICD-10-CM

## 2019-01-24 DIAGNOSIS — Z79899 Other long term (current) drug therapy: Secondary | ICD-10-CM

## 2019-01-24 DIAGNOSIS — I10 Essential (primary) hypertension: Secondary | ICD-10-CM

## 2019-01-24 DIAGNOSIS — E559 Vitamin D deficiency, unspecified: Secondary | ICD-10-CM | POA: Diagnosis not present

## 2019-01-24 DIAGNOSIS — E1100 Type 2 diabetes mellitus with hyperosmolarity without nonketotic hyperglycemic-hyperosmolar coma (NKHHC): Secondary | ICD-10-CM | POA: Diagnosis not present

## 2019-01-24 DIAGNOSIS — G8929 Other chronic pain: Secondary | ICD-10-CM

## 2019-01-25 ENCOUNTER — Other Ambulatory Visit: Payer: Self-pay | Admitting: *Deleted

## 2019-01-25 MED ORDER — MOMETASONE FUROATE 0.1 % EX CREA: 1.0000 "application " | TOPICAL_CREAM | Freq: Every day | CUTANEOUS | 1 refills | Status: DC

## 2019-01-29 NOTE — Assessment & Plan Note (Signed)
Continues to struggle with daily pain but manages by staying as active as able with with current meds prn

## 2019-01-29 NOTE — Assessment & Plan Note (Signed)
Supplement and monitor 

## 2019-01-29 NOTE — Assessment & Plan Note (Signed)
Well controlled, no changes to meds. Encouraged heart healthy diet such as the DASH diet and exercise as tolerated.  °

## 2019-01-29 NOTE — Assessment & Plan Note (Signed)
Increase leafy greens, consider increased lean red meat and using cast iron cookware. Continue to monitor, report any concerns 

## 2019-01-29 NOTE — Assessment & Plan Note (Signed)
Dating back to an old injury in the service. He is being fitted for a brace later this week at [facility]. Ultimately he is in need of an ankle replacement.

## 2019-01-29 NOTE — Progress Notes (Signed)
Virtual Visit via phone Note  I connected with [name] on 01/24/19 at  2:40 PM EDT by a phone enabled telemedicine application and verified that I am speaking with the correct person using two identifiers.  Location: Patient: home Provider: office   I discussed the limitations of evaluation and management by telemedicine and the availability of in person appointments. The patient expressed understanding and agreed to proceed. Magdalene Molly, CMA was able to get the patient set up on visit, phone after being unable to set up video visit   Subjective:    Patient ID: Brent [name], male    DOB: 1955-12-31, 63 y.o.   MRN: [id number]  No chief complaint on file.   HPI Patient is in today for follow up on chronic medical concerns including hypertension, diabetes, back pain and right ankle pain. He is scheduled to have [facility] fit him for a right ankle brace later this week. Injury dates back to his days in the [employer]. They ultimately want to perform an ankle replacement. Denies CP/palp/SOB/HA/congestion/fevers/GI or GU c/o. Taking meds as prescribed  Past Medical History:  Diagnosis Date  . Acid reflux disease   . ACID REFLUX DISEASE 07/05/2007  . Acute gastric ulcer with bleeding   . Alcohol abuse   . Anemia   . Atherosclerosis   . Benign neoplasm of colon 07/05/2007  . Bilateral hip pain 10/05/2016  . Breast pain, left 11/17/2011  . CAD (coronary artery disease)   . Chest pain    a. Reportedly negative dobut echo performed prior to gastric bypass in 03/2011;  b. 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 12/2011 Cath: LM nl, LAD 50p, D1 31m, LCX min irregs, OM3 30, RCA 25p, 92m (FFR 0.99->0.89), PDA 30, EF 65%, Med Rx.  . COLONIC POLYPS, HX OF 10/29/2008  . Diarrhea 06/13/2010  . Diverticulosis 07/05/2018  . DIVERTICULOSIS, COLON 10/29/2008  . DM (diabetes mellitus), type 2, uncontrolled ([place])   . GI bleed   . Gout 03/04/2013  . Hearing loss 05/24/2013   Previous audiology evaluation  completely.  [name] HTN (hypertension) 07/14/2010  . Hx of colonic polyps   . HYPERSOMNIA, ASSOCIATED WITH SLEEP APNEA 07/26/2008  . Hypertension 07/14/2010  . 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 Internal hemorrhoids   . Knee pain, left 10/10/2010  . Morbid obesity ([place]) 03/27/2010   a. 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  [name] Neck pain 03/2015  . Other and unspecified hyperlipidemia 11/16/2012  . Post-operative nausea and vomiting    after the surgery in hospital in March 2020/ past the cauderization  . Preventative health care 11/17/2011  . Renal stone 11/2013  . Sleep apnea    a. CPAP  . Spinal stenosis   . Tear of meniscus of left knee 2012    Past Surgical History:  Procedure Laterality Date  . ANKLE SURGERY Right 1994  . BICEPS TENDON REPAIR     left side  . CARDIAC CATHETERIZATION     denies any chest pain in the past 2 years  . COLONOSCOPY    . colonoscopy polyps    . ESOPHAGOGASTRODUODENOSCOPY N/A 03/27/2013   Procedure: ESOPHAGOGASTRODUODENOSCOPY (EGD);  Surgeon: Irene Shipper, MD;  Location: [facility];  Service: Endoscopy;  Laterality: N/A;  . ESOPHAGOGASTRODUODENOSCOPY (EGD) WITH PROPOFOL N/A 07/06/2018   Procedure: ESOPHAGOGASTRODUODENOSCOPY (EGD) WITH PROPOFOL;  Surgeon: Jerene Bears, MD;  Location: [facility];  Service: Gastroenterology;  Laterality: N/A;  . GASTRIC BYPASS    . HERNIA REPAIR    .  HOT HEMOSTASIS N/A 07/06/2018   Procedure: HOT HEMOSTASIS (ARGON PLASMA COAGULATION/BICAP);  Surgeon: Jerene Bears, MD;  Location: [facility];  Service: Gastroenterology;  Laterality: N/A;  . KNEE ARTHROSCOPY Left 11/06/2010   Left, torn meniscus (repaired)  . LEFT HEART CATHETERIZATION WITH CORONARY ANGIOGRAM N/A 12/23/2011   Procedure: LEFT HEART CATHETERIZATION WITH CORONARY ANGIOGRAM;  Surgeon: Minus Breeding, MD;  Location: [facility] CATH LAB;  Service: Cardiovascular;  Laterality: N/A;  . REPLACEMENT TOTAL KNEE Right 01/2016   removed scar tissue/ cut tip of nerve bundle and  re-route  . right knee arthroscopy Right 07/05/14   Dr. Hart Robinsons, [place].  [name] ROTATOR CUFF REPAIR  2019   left  . SCHLEROTHERAPY  07/06/2018   Procedure: Woodward Ku;  Surgeon: Jerene Bears, MD;  Location: [facility];  Service: Gastroenterology;;  . TONSILLECTOMY  age 40  . TONSILLECTOMY     as a child  . TOTAL KNEE ARTHROPLASTY Right 01/20/2016   Procedure: RIGHT TOTAL KNEE ARTHROPLASTY;  Surgeon: Paralee Cancel, MD;  Location: [facility];  Service: Orthopedics;  Laterality: Right;  . UPPER GI ENDOSCOPY  03/27/13    Family History  Problem Relation Age of Onset  . Diabetes Mother   . Hypertension Mother   . Stroke Mother   . Hyperlipidemia Mother   . Hypertension Father   . Colon polyps Father   . Heart attack Father 55  . Stroke Father   . Heart attack Brother   . Diabetes Brother   . Heart disease Brother   . Heart attack Brother        Multiple  . Diabetes Brother   . Diabetes Sister   . Stroke Sister   . Obesity Brother   . Diabetes Brother   . Heart disease Brother   . Hypertension Maternal Grandmother   . ADD / ADHD Daughter   . Heart disease Brother   . Stomach cancer Neg Hx   . Colon cancer Neg Hx   . Esophageal cancer Neg Hx   . Rectal cancer Neg Hx     Social History   Socioeconomic History  . Marital status: Married    Spouse name: Not on file  . Number of children: 2  . Years of education: Not on file  . Highest education level: Not on file  Occupational History  . Occupation: Product manager: [employer]  Social Needs  . Financial resource strain: Not on file  . Food insecurity    Worry: Not on file    Inability: Not on file  . Transportation needs    Medical: Not on file    Non-medical: Not on file  Tobacco Use  . Smoking status: Former Smoker    Packs/day: 1.50    Years: 20.00    Pack years: 30.00    Types: Cigarettes    Quit date: 04/21/1991    Years since quitting: 27.7  . Smokeless tobacco: Never Used  Substance  and Sexual Activity  . Alcohol use: Yes    Alcohol/week: 3.0 standard drinks    Types: 3 Cans of beer per week    Comment: social  . Drug use: No  . Sexual activity: Not on file  Lifestyle  . Physical activity    Days per week: Not on file    Minutes per session: Not on file  . Stress: Not on file  Relationships  . Social connections    Talks on phone: Not on file  Gets together: Not on file    Attends religious service: Not on file    Active member of club or organization: Not on file    Attends meetings of clubs or organizations: Not on file    Relationship status: Not on file  . Intimate partner violence    Fear of current or ex partner: Not on file    Emotionally abused: Not on file    Physically abused: Not on file    Forced sexual activity: Not on file  Other Topics Concern  . Not on file  Social History Narrative   Lives with wife in [place].  Does not routinely exercise.    Outpatient Medications Prior to Visit  Medication Sig Dispense Refill  . Cholecalciferol (VITAMIN D3 PO) Take 5,000 Units by mouth daily.    . Cyanocobalamin (VITAMIN B-12) 1000 MCG SUBL Place 1 each under the tongue every morning.     [name] econazole nitrate 1 % cream Apply topically daily. 30 g 1  . EPIPEN 2-PAK 0.3 MG/0.3ML SOAJ injection Inject 0.3 mg into the skin as needed.     . ergocalciferol (VITAMIN D2) 1.25 MG (50000 UT) capsule once a week.     . fenofibrate 160 MG tablet TAKE 1 TABLET DAILY 90 tablet 3  . Ferrous Gluconate-C-Folic Acid (IRON-C PO) Take by mouth. Iron 65 mg and vit C-125 mg -take one daily    . folic acid (FOLVITE) 1 MG tablet Take 1 tablet (1 mg total) by mouth daily. 30 tablet 0  . gabapentin (NEURONTIN) 300 MG capsule Take 1 capsule (300 mg total) by mouth 3 (three) times daily. 270 capsule 1  . glucose blood test strip Use as directed once daily to check blood sugar.  Diagnosis code E11.9 100 each 12  . HYDROcodone-acetaminophen (NORCO) 5-325 MG tablet Take 1  tablet by mouth at bedtime. 30 tablet 0  . LIVALO 1 MG TABS TAKE 1 TABLET DAILY (Patient taking differently: Take 1 mg by mouth daily. ) 90 tablet 4  . Magnesium 100 MG CAPS Take 1 capsule (100 mg total) by mouth daily. 30 capsule 1  . MELATONIN ER PO Take by mouth. Melatonin 12 mg-Take 2 at bedtime.    . metFORMIN (GLUCOPHAGE) 500 MG tablet TAKE 1 TABLET FOUR TIMES A DAY (Patient taking differently: Take 500 mg by mouth 4 (four) times daily. ) 360 tablet 1  . methocarbamol (ROBAXIN) 500 MG tablet TAKE ONE TABLET BY MOUTH EVERY 6 HOURS AS NEEDED FOR MUSCLE SPASMS 120 tablet 5  . metoprolol tartrate (LOPRESSOR) 50 MG tablet Take 1 tablet (50 mg total) by mouth 2 (two) times daily. 180 tablet 1  . Multiple Vitamin (MULTIVITAMIN) tablet Take 1 tablet by mouth daily. BARIATRIC VIT    . NON FORMULARY CBD oil- take 1000 mg daily    . pantoprazole (PROTONIX) 40 MG tablet TAKE 1 TABLET TWICE A DAY BEFORE MEALS 180 tablet 3  . sodium chloride (OCEAN) 0.65 % SOLN nasal spray Place 1 spray into both nostrils as needed for congestion (nose irritation). 30 mL 0  . thiamine 100 MG tablet Take 1 tablet (100 mg total) by mouth daily. 30 tablet 0  . VITAMIN E PO Take 1 tablet by mouth daily.    . mometasone (ELOCON) 0.1 % cream Apply 1 application topically daily. 50 g 1  . fexofenadine (ALLEGRA) 180 MG tablet Take 180 mg by mouth daily.     No facility-administered medications prior to visit.  Allergies  Allergen Reactions  . Bee Venom Anaphylaxis  . Lipitor [Atorvastatin] Other (See Comments)    Myalgias, memory changes  . Morphine Other (See Comments)    hyperactive  . Hydrocodone Itching  . Pravastatin Other (See Comments)    Made joints hurt. --also simvastatin     Review of Systems  Constitutional: Positive for malaise/fatigue. Negative for fever.  HENT: Negative for congestion.   Eyes: Negative for blurred vision.  Respiratory: Negative for shortness of breath.   Cardiovascular: Negative  for chest pain, palpitations and leg swelling.  Gastrointestinal: Negative for abdominal pain, blood in stool and nausea.  Genitourinary: Negative for dysuria and frequency.  Musculoskeletal: Positive for back pain and joint pain. Negative for falls.  Skin: Negative for rash.  Neurological: Negative for dizziness, loss of consciousness and headaches.  Endo/Heme/Allergies: Negative for environmental allergies.  Psychiatric/Behavioral: Negative for depression. The patient is not nervous/anxious.        Objective:    Physical Exam unable to obtain via phone  BP 132/77 (BP Location: Left Arm, Patient Position: Sitting)   Pulse 83   Temp (!) 97.2 F (36.2 C) (Oral)   SpO2 97%  Wt Readings from Last 3 Encounters:  11/22/18 260 lb 1.9 oz (118 kg)  10/24/18 254 lb 12.8 oz (115.6 kg)  09/05/18 238 lb (108 kg)    Diabetic Foot Exam - Simple   No data filed     Lab Results  Component Value Date   WBC 4.0 01/20/2019   HGB 14.0 01/20/2019   HCT 41.4 01/20/2019   PLT 163.0 01/20/2019   GLUCOSE 196 (H) 01/20/2019   CHOL 130 01/20/2019   TRIG 146.0 01/20/2019   HDL 39.70 01/20/2019   LDLDIRECT 85.0 11/17/2016   LDLCALC 61 01/20/2019   ALT 17 01/20/2019   AST 20 01/20/2019   NA 139 01/20/2019   K 4.1 01/20/2019   CL 102 01/20/2019   CREATININE 1.04 01/20/2019   BUN 16 01/20/2019   CO2 28 01/20/2019   TSH 1.84 01/20/2019   PSA 1.55 06/25/2017   INR 1.3 (H) 07/06/2018   HGBA1C 6.5 01/20/2019   MICROALBUR 1.1 12/02/2015    Lab Results  Component Value Date   TSH 1.84 01/20/2019   Lab Results  Component Value Date   WBC 4.0 01/20/2019   HGB 14.0 01/20/2019   HCT 41.4 01/20/2019   MCV 91.7 01/20/2019   PLT 163.0 01/20/2019   Lab Results  Component Value Date   NA 139 01/20/2019   K 4.1 01/20/2019   CO2 28 01/20/2019   GLUCOSE 196 (H) 01/20/2019   BUN 16 01/20/2019   CREATININE 1.04 01/20/2019   BILITOT 0.9 01/20/2019   ALKPHOS 40 01/20/2019   AST 20 01/20/2019    ALT 17 01/20/2019   PROT 6.6 01/20/2019   ALBUMIN 4.6 01/20/2019   CALCIUM 9.8 01/20/2019   ANIONGAP 8 07/14/2018   GFR 72.06 01/20/2019   Lab Results  Component Value Date   CHOL 130 01/20/2019   Lab Results  Component Value Date   HDL 39.70 01/20/2019   Lab Results  Component Value Date   LDLCALC 61 01/20/2019   Lab Results  Component Value Date   TRIG 146.0 01/20/2019   Lab Results  Component Value Date   CHOLHDL 3 01/20/2019   Lab Results  Component Value Date   HGBA1C 6.5 01/20/2019       Assessment & Plan:   Problem List Items Addressed This Visit  T2DM (type 2 diabetes mellitus) ([place])   Relevant Orders   Hemoglobin A1c   Essential hypertension    Well controlled, no changes to meds. Encouraged heart healthy diet such as the DASH diet and exercise as tolerated.       Relevant Orders   CBC   Comprehensive metabolic panel   TSH   Hyperlipidemia, mixed   Relevant Orders   Lipid panel   Vitamin D deficiency - Primary    Supplement and monitor      Relevant Orders   VITAMIN D 25 Hydroxy (Vit-D Deficiency, Fractures)   Ankle pain, right    Dating back to an old injury in the service. He is being fitted for a brace later this week at [facility]. Ultimately he is in need of an ankle replacement.       Vitamin B12 deficiency    Supplement and monitor      Relevant Orders   Vitamin B12   IDA (iron deficiency anemia)    Increase leafy greens, consider increased lean red meat and using cast iron cookware. Continue to monitor, report any concerns      Back pain    Continues to struggle with daily pain but manages by staying as active as able with with current meds prn       Other Visit Diagnoses    High risk medication use       Relevant Orders   Pain Mgmt, Profile 8 w/Conf, U      I am having [name] "Don" maintain his multivitamin, Vitamin B-12, glucose blood, metFORMIN, Magnesium, Livalo, EpiPen 2-Pak, VITAMIN E PO, folic acid,  thiamine, sodium chloride, ergocalciferol, Cholecalciferol (VITAMIN D3 PO), Ferrous Gluconate-C-Folic Acid (IRON-C PO), MELATONIN ER PO, NON FORMULARY, fexofenadine, methocarbamol, gabapentin, econazole nitrate, pantoprazole, metoprolol tartrate, HYDROcodone-acetaminophen, and fenofibrate.  No orders of the defined types were placed in this encounter.     I discussed the assessment and treatment plan with the patient. The patient was provided an opportunity to ask questions and all were answered. The patient agreed with the plan and demonstrated an understanding of the instructions.   The patient was advised to call back or seek an in-person evaluation if the symptoms worsen or if the condition fails to improve as anticipated.  I provided 25 minutes of non-face-to-face time during this encounter.   [name], MD

## 2019-02-02 ENCOUNTER — Encounter: Payer: Self-pay | Admitting: Family Medicine

## 2019-02-02 ENCOUNTER — Other Ambulatory Visit: Payer: Self-pay | Admitting: Family Medicine

## 2019-02-02 MED ORDER — HYDROCODONE-ACETAMINOPHEN 5-325 MG PO TABS: 1.0000 | ORAL_TABLET | Freq: Every day | ORAL | 0 refills | Status: DC

## 2019-02-21 ENCOUNTER — Inpatient Hospital Stay

## 2019-02-21 ENCOUNTER — Inpatient Hospital Stay: Attending: Hematology & Oncology | Admitting: Family

## 2019-02-21 DIAGNOSIS — R202 Paresthesia of skin: Secondary | ICD-10-CM | POA: Insufficient documentation

## 2019-02-21 DIAGNOSIS — D500 Iron deficiency anemia secondary to blood loss (chronic): Secondary | ICD-10-CM | POA: Insufficient documentation

## 2019-02-21 DIAGNOSIS — K922 Gastrointestinal hemorrhage, unspecified: Secondary | ICD-10-CM | POA: Insufficient documentation

## 2019-02-21 DIAGNOSIS — Z9884 Bariatric surgery status: Secondary | ICD-10-CM | POA: Insufficient documentation

## 2019-02-21 DIAGNOSIS — R200 Anesthesia of skin: Secondary | ICD-10-CM | POA: Insufficient documentation

## 2019-02-21 DIAGNOSIS — M4800 Spinal stenosis, site unspecified: Secondary | ICD-10-CM | POA: Insufficient documentation

## 2019-02-22 ENCOUNTER — Other Ambulatory Visit: Payer: Self-pay | Admitting: Family Medicine

## 2019-02-25 ENCOUNTER — Encounter: Payer: Self-pay | Admitting: Family

## 2019-03-01 ENCOUNTER — Telehealth: Payer: Self-pay | Admitting: Family

## 2019-03-01 ENCOUNTER — Other Ambulatory Visit: Payer: Self-pay

## 2019-03-01 ENCOUNTER — Inpatient Hospital Stay (HOSPITAL_BASED_OUTPATIENT_CLINIC_OR_DEPARTMENT_OTHER): Admitting: Family

## 2019-03-01 ENCOUNTER — Inpatient Hospital Stay

## 2019-03-01 VITALS — BP 127/66 | HR 68 | Temp 98.9°F | Resp 17 | Ht 68.0 in | Wt 256.0 lb

## 2019-03-01 DIAGNOSIS — K922 Gastrointestinal hemorrhage, unspecified: Secondary | ICD-10-CM | POA: Diagnosis not present

## 2019-03-01 DIAGNOSIS — Z9884 Bariatric surgery status: Secondary | ICD-10-CM | POA: Diagnosis not present

## 2019-03-01 DIAGNOSIS — K909 Intestinal malabsorption, unspecified: Secondary | ICD-10-CM

## 2019-03-01 DIAGNOSIS — D500 Iron deficiency anemia secondary to blood loss (chronic): Secondary | ICD-10-CM | POA: Diagnosis not present

## 2019-03-01 DIAGNOSIS — M4800 Spinal stenosis, site unspecified: Secondary | ICD-10-CM | POA: Diagnosis not present

## 2019-03-01 DIAGNOSIS — R200 Anesthesia of skin: Secondary | ICD-10-CM | POA: Diagnosis not present

## 2019-03-01 DIAGNOSIS — R202 Paresthesia of skin: Secondary | ICD-10-CM | POA: Diagnosis not present

## 2019-03-01 LAB — CBC WITH DIFFERENTIAL (CANCER CENTER ONLY)
Abs Immature Granulocytes: 0.01 10*3/uL (ref 0.00–0.07)
Basophils Absolute: 0 10*3/uL (ref 0.0–0.1)
Basophils Relative: 1 %
Eosinophils Absolute: 0.1 10*3/uL (ref 0.0–0.5)
Eosinophils Relative: 4 %
HCT: 39.2 % (ref 39.0–52.0)
Hemoglobin: 13 g/dL (ref 13.0–17.0)
Immature Granulocytes: 0 %
Lymphocytes Relative: 26 %
Lymphs Abs: 0.9 10*3/uL (ref 0.7–4.0)
MCH: 30.6 pg (ref 26.0–34.0)
MCHC: 33.2 g/dL (ref 30.0–36.0)
MCV: 92.2 fL (ref 80.0–100.0)
Monocytes Absolute: 0.5 10*3/uL (ref 0.1–1.0)
Monocytes Relative: 15 %
Neutro Abs: 1.9 10*3/uL (ref 1.7–7.7)
Neutrophils Relative %: 54 %
Platelet Count: 175 10*3/uL (ref 150–400)
RBC: 4.25 MIL/uL (ref 4.22–5.81)
RDW: 12.4 % (ref 11.5–15.5)
WBC Count: 3.6 10*3/uL — ABNORMAL LOW (ref 4.0–10.5)
nRBC: 0 % (ref 0.0–0.2)

## 2019-03-01 LAB — RETICULOCYTES
Immature Retic Fract: 6.7 % (ref 2.3–15.9)
RBC.: 4.28 MIL/uL (ref 4.22–5.81)
Retic Count, Absolute: 56.9 10*3/uL (ref 19.0–186.0)
Retic Ct Pct: 1.3 % (ref 0.4–3.1)

## 2019-03-01 LAB — IRON AND TIBC
Iron: 68 ug/dL (ref 42–163)
Saturation Ratios: 22 % (ref 20–55)
TIBC: 311 ug/dL (ref 202–409)
UIBC: 243 ug/dL (ref 117–376)

## 2019-03-01 LAB — FERRITIN: Ferritin: 178 ng/mL (ref 24–336)

## 2019-03-01 NOTE — Telephone Encounter (Signed)
Called and spoke with patinet regarding appointments added per 11/11 los

## 2019-03-01 NOTE — Progress Notes (Signed)
Hematology and Oncology Follow Up Visit  IRL ABERG [id number] [birth year] 63 y.o. 03/01/2019   Principle Diagnosis:  Iron deficiency anemia secondary tomalabsorption and history of acute GI bleed  Current Therapy:   IV iron as indicated    Interim History:  Mr. Outley is here today for follow-up. He is doing quite well and has no complaints at this time.  He has not noted any blood loss. No bruising or petechiae.  No fever, chills, n/v, cough, rash, dizziness, SOB, chest pain, palpitations, abdominal pain or changes in bowel or bladder habits.  He has numbness and tingling in the right hand due to spinal stenosis.  He has puffiness in his feet and ankles that comes and goes. He is trying to avoid salty foods to help reduce fluid retention.  He has maintained a good appetite and is staying well hydrated. His weight is stable.   ECOG Performance Status: 1 - Symptomatic but completely ambulatory  Medications:  Allergies as of 03/01/2019      Reactions   Bee Venom Anaphylaxis   Lipitor [atorvastatin] Other (See Comments)   Myalgias, memory changes   Morphine Other (See Comments)   hyperactive   Hydrocodone Itching   Pravastatin Other (See Comments)   Made joints hurt. --also simvastatin       Medication List       Accurate as of March 01, 2019  8:47 AM. If you have any questions, ask your nurse or doctor.        econazole nitrate 1 % cream Apply topically daily.   EpiPen 2-Pak 0.3 mg/0.3 mL Soaj injection Generic drug: EPINEPHrine Inject 0.3 mg into the skin as needed.   ergocalciferol 1.25 MG (50000 UT) capsule Commonly known as: VITAMIN D2 once a week.   fenofibrate 160 MG tablet TAKE 1 TABLET DAILY   fexofenadine 180 MG tablet Commonly known as: ALLEGRA Take 180 mg by mouth daily.   folic acid 1 MG tablet Commonly known as: FOLVITE Take 1 tablet (1 mg total) by mouth daily.   gabapentin 300 MG capsule Commonly known as: NEURONTIN TAKE 1  CAPSULE THREE TIMES A DAY   glucose blood test strip Use as directed once daily to check blood sugar.  Diagnosis code E11.9   HYDROcodone-acetaminophen 5-325 MG tablet Commonly known as: Norco Take 1 tablet by mouth at bedtime.   IRON-C PO Take by mouth. Iron 65 mg and vit C-125 mg -take one daily   Livalo 1 MG Tabs Generic drug: Pitavastatin Calcium TAKE 1 TABLET DAILY What changed: how much to take   Magnesium 100 MG Caps Take 1 capsule (100 mg total) by mouth daily.   MELATONIN ER PO Take by mouth. Melatonin 12 mg-Take 2 at bedtime.   metFORMIN 500 MG tablet Commonly known as: GLUCOPHAGE TAKE 1 TABLET FOUR TIMES A DAY   methocarbamol 500 MG tablet Commonly known as: ROBAXIN TAKE ONE TABLET BY MOUTH EVERY 6 HOURS AS NEEDED FOR MUSCLE SPASMS   metoprolol tartrate 50 MG tablet Commonly known as: LOPRESSOR Take 1 tablet (50 mg total) by mouth 2 (two) times daily.   mometasone 0.1 % cream Commonly known as: Elocon Apply 1 application topically daily.   multivitamin tablet Take 1 tablet by mouth daily. BARIATRIC VIT   NON FORMULARY CBD oil- take 1000 mg daily   pantoprazole 40 MG tablet Commonly known as: PROTONIX TAKE 1 TABLET TWICE A DAY BEFORE MEALS   sodium chloride 0.65 % Soln nasal spray Commonly known as:  OCEAN Place 1 spray into both nostrils as needed for congestion (nose irritation).   thiamine 100 MG tablet Take 1 tablet (100 mg total) by mouth daily.   Vitamin B-12 1000 MCG Subl Place 1 each under the tongue every morning.   VITAMIN D3 PO Take 5,000 Units by mouth daily.   VITAMIN E PO Take 1 tablet by mouth daily.       Allergies:  Allergies  Allergen Reactions  . Bee Venom Anaphylaxis  . Lipitor [Atorvastatin] Other (See Comments)    Myalgias, memory changes  . Morphine Other (See Comments)    hyperactive  . Hydrocodone Itching  . Pravastatin Other (See Comments)    Made joints hurt. --also simvastatin     Past Medical  History, Surgical history, Social history, and Family History were reviewed and updated.  Review of Systems: All other 10 point review of systems is negative.   Physical Exam:  height is 5\' 8"  (1.727 m) and weight is 256 lb (116.1 kg). His temperature is 98.9 F (37.2 C). His blood pressure is 127/66 and his pulse is 68. His respiration is 17 and oxygen saturation is 99%.   Wt Readings from Last 3 Encounters:  03/01/19 256 lb (116.1 kg)  11/22/18 260 lb 1.9 oz (118 kg)  10/24/18 254 lb 12.8 oz (115.6 kg)    Ocular: Sclerae unicteric, pupils equal, round and reactive to light Ear-nose-throat: Oropharynx clear, dentition fair Lymphatic: No cervical or supraclavicular adenopathy Lungs no rales or rhonchi, good excursion bilaterally Heart regular rate and rhythm, no murmur appreciated Abd soft, nontender, positive bowel sounds, no liver or spleen tip palpated on exam, no fluid wave  MSK no focal spinal tenderness, no joint edema Neuro: non-focal, well-oriented, appropriate affect Breasts: Deferred   Lab Results  Component Value Date   WBC 3.6 (L) 03/01/2019   HGB 13.0 03/01/2019   HCT 39.2 03/01/2019   MCV 92.2 03/01/2019   PLT 175 03/01/2019   Lab Results  Component Value Date   FERRITIN 38 11/22/2018   IRON 63 11/22/2018   TIBC 388 11/22/2018   UIBC 325 11/22/2018   IRONPCTSAT 16 (L) 11/22/2018   Lab Results  Component Value Date   RETICCTPCT 1.3 03/01/2019   RBC 4.25 03/01/2019   No results found for: KPAFRELGTCHN, LAMBDASER, KAPLAMBRATIO No results found for: Kandis Cocking, IGMSERUM No results found for: Odetta Pink, SPEI   Chemistry      Component Value Date/Time   NA 139 01/20/2019 0939   K 4.1 01/20/2019 0939   CL 102 01/20/2019 0939   CO2 28 01/20/2019 0939   BUN 16 01/20/2019 0939   CREATININE 1.04 01/20/2019 0939   CREATININE 0.95 07/14/2018 1021   CREATININE 0.97 11/21/2013 0948      Component  Value Date/Time   CALCIUM 9.8 01/20/2019 0939   ALKPHOS 40 01/20/2019 0939   AST 20 01/20/2019 0939   AST 15 07/14/2018 1021   ALT 17 01/20/2019 0939   ALT 11 07/14/2018 1021   BILITOT 0.9 01/20/2019 0939   BILITOT 0.4 07/14/2018 1021       Impression and Plan: Mr. Milos is a very pleasant 63 yo caucasian gentleman with iron deficiency anemia secondary to malabsorption (gastric bypass) as well intermittent GI blood loss with ulcer.  He is doing well and has no complaints at this time.  We will see what his iron studies show and brign him back in for infusion if needed.  He really wants to be able to donate blood again.  We will go ahead and plan to see him back in another 3 months.  He will contact our office with any questions or concerns. We can certainly see him sooner if needed.   Laverna Peace, NP 11/11/20208:47 AM

## 2019-03-03 ENCOUNTER — Encounter: Payer: Self-pay | Admitting: Family Medicine

## 2019-03-05 ENCOUNTER — Other Ambulatory Visit: Payer: Self-pay | Admitting: Family Medicine

## 2019-03-05 MED ORDER — HYDROCODONE-ACETAMINOPHEN 5-325 MG PO TABS: 1.0000 | ORAL_TABLET | Freq: Every day | ORAL | 0 refills | Status: DC

## 2019-03-25 ENCOUNTER — Encounter: Payer: Self-pay | Admitting: Family Medicine

## 2019-03-28 ENCOUNTER — Other Ambulatory Visit: Payer: Self-pay | Admitting: Family Medicine

## 2019-03-28 MED ORDER — TRAMADOL HCL 50 MG PO TABS: 50.0000 mg | ORAL_TABLET | Freq: Three times a day (TID) | ORAL | 0 refills | Status: AC | PRN

## 2019-04-03 ENCOUNTER — Encounter: Payer: Self-pay | Admitting: Family Medicine

## 2019-04-04 ENCOUNTER — Other Ambulatory Visit: Payer: Self-pay | Admitting: Family Medicine

## 2019-04-04 MED ORDER — HYDROCODONE-ACETAMINOPHEN 5-325 MG PO TABS: 1.0000 | ORAL_TABLET | Freq: Every day | ORAL | 0 refills | Status: DC

## 2019-04-04 NOTE — Telephone Encounter (Signed)
Pt requesting refill on HYDROcodone-acetaminophen (NORCO) 5-325 MG tablet. He has enough for a couple of days.   [facility] [place], [place] - [address] Phone:  [phone]  Fax:  [phone]

## 2019-04-04 NOTE — Telephone Encounter (Signed)
Requesting:Norco  Contract:yes UDS:low risk  Last OV:01/24/19 Next OV:05/09/19 Last Refill: 03/05/19  #30-0rf Database:   Please advise

## 2019-04-08 ENCOUNTER — Encounter: Payer: Self-pay | Admitting: Family Medicine

## 2019-04-11 ENCOUNTER — Encounter: Payer: Self-pay | Admitting: Family Medicine

## 2019-04-11 ENCOUNTER — Other Ambulatory Visit: Payer: Self-pay | Admitting: Family Medicine

## 2019-04-11 MED ORDER — METFORMIN HCL 500 MG PO TABS: 500.0000 mg | ORAL_TABLET | Freq: Four times a day (QID) | ORAL | 1 refills | Status: DC

## 2019-04-11 MED ORDER — HYDROCHLOROTHIAZIDE 12.5 MG PO CAPS: 12.5000 mg | ORAL_CAPSULE | Freq: Every day | ORAL | 1 refills | Status: DC

## 2019-04-13 NOTE — Telephone Encounter (Signed)
Patient needs virtual visit next month please arrange

## 2019-04-13 NOTE — Telephone Encounter (Signed)
Yes thanks 

## 2019-05-03 ENCOUNTER — Other Ambulatory Visit: Payer: Self-pay

## 2019-05-03 ENCOUNTER — Other Ambulatory Visit (INDEPENDENT_AMBULATORY_CARE_PROVIDER_SITE_OTHER)

## 2019-05-03 ENCOUNTER — Encounter: Payer: Self-pay | Admitting: Family Medicine

## 2019-05-03 DIAGNOSIS — I10 Essential (primary) hypertension: Secondary | ICD-10-CM | POA: Diagnosis not present

## 2019-05-03 DIAGNOSIS — E782 Mixed hyperlipidemia: Secondary | ICD-10-CM | POA: Diagnosis not present

## 2019-05-03 DIAGNOSIS — E1100 Type 2 diabetes mellitus with hyperosmolarity without nonketotic hyperglycemic-hyperosmolar coma (NKHHC): Secondary | ICD-10-CM

## 2019-05-03 DIAGNOSIS — E538 Deficiency of other specified B group vitamins: Secondary | ICD-10-CM | POA: Diagnosis not present

## 2019-05-03 DIAGNOSIS — E559 Vitamin D deficiency, unspecified: Secondary | ICD-10-CM

## 2019-05-03 DIAGNOSIS — Z79899 Other long term (current) drug therapy: Secondary | ICD-10-CM

## 2019-05-03 LAB — VITAMIN B12: Vitamin B-12: 1053 pg/mL — ABNORMAL HIGH (ref 211–911)

## 2019-05-03 LAB — COMPREHENSIVE METABOLIC PANEL
ALT: 14 U/L (ref 0–53)
AST: 19 U/L (ref 0–37)
Albumin: 4.3 g/dL (ref 3.5–5.2)
Alkaline Phosphatase: 33 U/L — ABNORMAL LOW (ref 39–117)
BUN: 23 mg/dL (ref 6–23)
CO2: 29 mEq/L (ref 19–32)
Calcium: 9.2 mg/dL (ref 8.4–10.5)
Chloride: 99 mEq/L (ref 96–112)
Creatinine, Ser: 1 mg/dL (ref 0.40–1.50)
GFR: 75.33 mL/min (ref >60.00)
Glucose, Bld: 140 mg/dL — ABNORMAL HIGH (ref 70–99)
Potassium: 3.9 mEq/L (ref 3.5–5.1)
Sodium: 137 mEq/L (ref 135–145)
Total Bilirubin: 0.7 mg/dL (ref 0.2–1.2)
Total Protein: 6.2 g/dL (ref 6.0–8.3)

## 2019-05-03 LAB — CBC
HCT: 38.4 % — ABNORMAL LOW (ref 39.0–52.0)
Hemoglobin: 12.8 g/dL — ABNORMAL LOW (ref 13.0–17.0)
MCHC: 33.4 g/dL (ref 30.0–36.0)
MCV: 91.6 fl (ref 78.0–100.0)
Platelets: 209 10*3/uL (ref 150.0–400.0)
RBC: 4.19 Mil/uL — ABNORMAL LOW (ref 4.22–5.81)
RDW: 13.1 % (ref 11.5–15.5)
WBC: 5.6 10*3/uL (ref 4.0–10.5)

## 2019-05-03 LAB — TSH: TSH: 6.41 u[IU]/mL — ABNORMAL HIGH (ref 0.35–4.50)

## 2019-05-03 LAB — VITAMIN D 25 HYDROXY (VIT D DEFICIENCY, FRACTURES): VITD: 50.06 ng/mL (ref 30.00–100.00)

## 2019-05-03 LAB — LIPID PANEL
Cholesterol: 143 mg/dL (ref 0–200)
HDL: 55.1 mg/dL (ref >39.00)
LDL Cholesterol: 59 mg/dL (ref 0–99)
NonHDL: 88.25
Total CHOL/HDL Ratio: 3
Triglycerides: 148 mg/dL (ref 0.0–149.0)
VLDL: 29.6 mg/dL (ref 0.0–40.0)

## 2019-05-03 LAB — HEMOGLOBIN A1C: Hgb A1c MFr Bld: 6.3 % (ref 4.6–6.5)

## 2019-05-04 ENCOUNTER — Other Ambulatory Visit: Payer: Self-pay | Admitting: Family Medicine

## 2019-05-04 MED ORDER — HYDROCODONE-ACETAMINOPHEN 5-325 MG PO TABS: 1.0000 | ORAL_TABLET | Freq: Every day | ORAL | 0 refills | Status: DC

## 2019-05-04 NOTE — Telephone Encounter (Signed)
Last RX:  04/04/19, #30 Last OV:  01/24/19 Next OV:  05/09/19 UDS:  05/03/19 CSC: ?not on file

## 2019-05-06 ENCOUNTER — Encounter: Payer: Self-pay | Admitting: Family Medicine

## 2019-05-06 LAB — PAIN MGMT, PROFILE 8 W/CONF, U
6 Acetylmorphine: NEGATIVE ng/mL
Alcohol Metabolites: POSITIVE ng/mL — AB (ref <500)
Amphetamines: NEGATIVE ng/mL
Benzodiazepines: NEGATIVE ng/mL
Buprenorphine, Urine: NEGATIVE ng/mL
Cocaine Metabolite: NEGATIVE ng/mL
Codeine: NEGATIVE ng/mL
Creatinine: 56.4 mg/dL
Ethyl Glucuronide (ETG): 1067 ng/mL
Ethyl Sulfate (ETS): 627 ng/mL
Hydrocodone: 109 ng/mL
Hydromorphone: 93 ng/mL
MDMA: NEGATIVE ng/mL
Marijuana Metabolite: NEGATIVE ng/mL
Morphine: NEGATIVE ng/mL
Norhydrocodone: 187 ng/mL
Opiates: POSITIVE ng/mL
Oxidant: NEGATIVE ug/mL
Oxycodone: NEGATIVE ng/mL
pH: 5.6 (ref 4.5–9.0)

## 2019-05-09 ENCOUNTER — Other Ambulatory Visit: Payer: Self-pay

## 2019-05-09 ENCOUNTER — Ambulatory Visit (INDEPENDENT_AMBULATORY_CARE_PROVIDER_SITE_OTHER): Admitting: Family Medicine

## 2019-05-09 DIAGNOSIS — M545 Low back pain, unspecified: Secondary | ICD-10-CM

## 2019-05-09 DIAGNOSIS — E1100 Type 2 diabetes mellitus with hyperosmolarity without nonketotic hyperglycemic-hyperosmolar coma (NKHHC): Secondary | ICD-10-CM

## 2019-05-09 DIAGNOSIS — I10 Essential (primary) hypertension: Secondary | ICD-10-CM

## 2019-05-09 DIAGNOSIS — D649 Anemia, unspecified: Secondary | ICD-10-CM

## 2019-05-09 DIAGNOSIS — E559 Vitamin D deficiency, unspecified: Secondary | ICD-10-CM

## 2019-05-09 DIAGNOSIS — H04123 Dry eye syndrome of bilateral lacrimal glands: Secondary | ICD-10-CM

## 2019-05-09 MED ORDER — GABAPENTIN 300 MG PO CAPS: ORAL_CAPSULE | ORAL | 1 refills | Status: DC

## 2019-05-09 NOTE — Progress Notes (Signed)
Tramadol isnt working as well any more for his pain

## 2019-05-10 ENCOUNTER — Encounter: Payer: Self-pay | Admitting: Family Medicine

## 2019-05-10 ENCOUNTER — Telehealth: Payer: Self-pay | Admitting: *Deleted

## 2019-05-10 ENCOUNTER — Other Ambulatory Visit (INDEPENDENT_AMBULATORY_CARE_PROVIDER_SITE_OTHER)

## 2019-05-10 DIAGNOSIS — D649 Anemia, unspecified: Secondary | ICD-10-CM | POA: Diagnosis not present

## 2019-05-10 LAB — FECAL OCCULT BLOOD, IMMUNOCHEMICAL: Fecal Occult Bld: NEGATIVE

## 2019-05-10 NOTE — Telephone Encounter (Signed)
Received call from [facility] at [facility] lab needing order for IFOB they received on pt. Order placed.

## 2019-05-14 DIAGNOSIS — H04123 Dry eye syndrome of bilateral lacrimal glands: Secondary | ICD-10-CM | POA: Insufficient documentation

## 2019-05-14 NOTE — Assessment & Plan Note (Signed)
Increase leafy greens, consider increased lean red meat and using cast iron cookware. Continue to monitor, report any concerns. He just had an iron infusion recently and continues bariatric iron

## 2019-05-14 NOTE — Assessment & Plan Note (Signed)
Supplement and monitor 

## 2019-05-14 NOTE — Assessment & Plan Note (Signed)
Encouraged to hydrate well and use hydrating eye drops he reports his symptoms are tolerable and stable.

## 2019-05-14 NOTE — Assessment & Plan Note (Signed)
Monitor and report any concerns, no changes to meds. Encouraged heart healthy diet such as the DASH diet and exercise as tolerated.  ?

## 2019-05-14 NOTE — Assessment & Plan Note (Signed)
hgba1c acceptable, minimize simple carbs. Increase exercise as tolerated. Continue current meds 

## 2019-05-14 NOTE — Progress Notes (Signed)
Patient ID: Brent [name], male   DOB: 16-Oct-1955, 64 y.o.   MRN: [id number] Virtual Visit via phone Note  I connected with Brent [name] on 05/09/19 at  9:20 AM EST by a phone enabled telemedicine application and verified that I am speaking with the correct person using two identifiers.  Location: Patient: home Provider: office   I discussed the limitations of evaluation and management by telemedicine and the availability of in person appointments. The patient expressed understanding and agreed to proceed. Magdalene Molly, CMA was able to get patient set up on visit, phone after being unable to set up a video visit.    Subjective:    Patient ID: Brent [name], male    DOB: 04-Jul-1955, 64 y.o.   MRN: [id number]  Chief Complaint  Patient presents with  . Follow-up    HPI Patient is in today for follow up. His greatest concern is his persistent pain. He struggles with daily low back pain and it is disrupting his rest and daily activities. Pain is worse when he is non his feet for extended periods of time. No recent fall or trauma. Denies CP/palp/SOB/HA/congestion/fevers/GI or GU c/o. Taking meds as prescribed. He notes fatigue and just had an iron infusion. Denies CP/palp/SOB/HA/congestion/fevers/GI or GU c/o. Taking meds as prescribed  Past Medical History:  Diagnosis Date  . Acid reflux disease   . ACID REFLUX DISEASE 07/05/2007  . Acute gastric ulcer with bleeding   . Alcohol abuse   . Anemia   . Atherosclerosis   . Benign neoplasm of colon 07/05/2007  . Bilateral hip pain 10/05/2016  . Breast pain, left 11/17/2011  . CAD (coronary artery disease)   . Chest pain    a. Reportedly negative dobut echo performed prior to gastric bypass in 03/2011;  b. 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 12/2011 Cath: LM nl, LAD 50p, D1 10m, LCX min irregs, OM3 30, RCA 25p, 1m (FFR 0.99->0.89), PDA 30, EF 65%, Med Rx.  . COLONIC POLYPS, HX OF 10/29/2008  . Diarrhea 06/13/2010  . Diverticulosis  07/05/2018  . DIVERTICULOSIS, COLON 10/29/2008  . DM (diabetes mellitus), type 2, uncontrolled ([place])   . GI bleed   . Gout 03/04/2013  . Hearing loss 05/24/2013   Previous audiology evaluation completely.  [name] HTN (hypertension) 07/14/2010  . Hx of colonic polyps   . HYPERSOMNIA, ASSOCIATED WITH SLEEP APNEA 07/26/2008  . Hypertension 07/14/2010  . 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 Internal hemorrhoids   . Knee pain, left 10/10/2010  . Morbid obesity ([place]) 03/27/2010   a. 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  [name] Neck pain 03/2015  . Other and unspecified hyperlipidemia 11/16/2012  . Post-operative nausea and vomiting    after the surgery in hospital in March 2020/ past the cauderization  . Preventative health care 11/17/2011  . Renal stone 11/2013  . Sleep apnea    a. CPAP  . Spinal stenosis   . Tear of meniscus of left knee 2012    Past Surgical History:  Procedure Laterality Date  . ANKLE SURGERY Right 1994  . BICEPS TENDON REPAIR     left side  . CARDIAC CATHETERIZATION     denies any chest pain in the past 2 years  . COLONOSCOPY    . colonoscopy polyps    . ESOPHAGOGASTRODUODENOSCOPY N/A 03/27/2013   Procedure: ESOPHAGOGASTRODUODENOSCOPY (EGD);  Surgeon: Irene Shipper, MD;  Location: [facility];  Service: Endoscopy;  Laterality: N/A;  . ESOPHAGOGASTRODUODENOSCOPY (EGD) WITH  PROPOFOL N/A 07/06/2018   Procedure: ESOPHAGOGASTRODUODENOSCOPY (EGD) WITH PROPOFOL;  Surgeon: Jerene Bears, MD;  Location: [facility];  Service: Gastroenterology;  Laterality: N/A;  . GASTRIC BYPASS    . HERNIA REPAIR    . HOT HEMOSTASIS N/A 07/06/2018   Procedure: HOT HEMOSTASIS (ARGON PLASMA COAGULATION/BICAP);  Surgeon: Jerene Bears, MD;  Location: [facility];  Service: Gastroenterology;  Laterality: N/A;  . KNEE ARTHROSCOPY Left 11/06/2010   Left, torn meniscus (repaired)  . LEFT HEART CATHETERIZATION WITH CORONARY ANGIOGRAM N/A 12/23/2011   Procedure: LEFT HEART CATHETERIZATION WITH CORONARY ANGIOGRAM;   Surgeon: Minus Breeding, MD;  Location: [facility] CATH LAB;  Service: Cardiovascular;  Laterality: N/A;  . REPLACEMENT TOTAL KNEE Right 01/2016   removed scar tissue/ cut tip of nerve bundle and re-route  . right knee arthroscopy Right 07/05/14   Dr. Hart Robinsons, [place].  [name] ROTATOR CUFF REPAIR  2019   left  . SCHLEROTHERAPY  07/06/2018   Procedure: Woodward Ku;  Surgeon: Jerene Bears, MD;  Location: [facility];  Service: Gastroenterology;;  . TONSILLECTOMY  age 35  . TONSILLECTOMY     as a child  . TOTAL KNEE ARTHROPLASTY Right 01/20/2016   Procedure: RIGHT TOTAL KNEE ARTHROPLASTY;  Surgeon: Paralee Cancel, MD;  Location: [facility];  Service: Orthopedics;  Laterality: Right;  . UPPER GI ENDOSCOPY  03/27/13    Family History  Problem Relation Age of Onset  . Diabetes Mother   . Hypertension Mother   . Stroke Mother   . Hyperlipidemia Mother   . Hypertension Father   . Colon polyps Father   . Heart attack Father 79  . Stroke Father   . Heart attack Brother   . Diabetes Brother   . Heart disease Brother   . Heart attack Brother        Multiple  . Diabetes Brother   . Diabetes Sister   . Stroke Sister   . Obesity Brother   . Diabetes Brother   . Heart disease Brother   . Hypertension Maternal Grandmother   . ADD / ADHD Daughter   . Heart disease Brother   . Stomach cancer Neg Hx   . Colon cancer Neg Hx   . Esophageal cancer Neg Hx   . Rectal cancer Neg Hx     Social History   Socioeconomic History  . Marital status: Married    Spouse name: Not on file  . Number of children: 2  . Years of education: Not on file  . Highest education level: Not on file  Occupational History  . Occupation: TEACHER    Employer: [employer]  Tobacco Use  . Smoking status: Former Smoker    Packs/day: 1.50    Years: 20.00    Pack years: 30.00    Types: Cigarettes    Quit date: 04/21/1991    Years since quitting: 28.0  . Smokeless tobacco: Never Used  Substance and Sexual  Activity  . Alcohol use: Yes    Alcohol/week: 3.0 standard drinks    Types: 3 Cans of beer per week    Comment: social  . Drug use: No  . Sexual activity: Not on file  Other Topics Concern  . Not on file  Social History Narrative   Lives with wife in [place].  Does not routinely exercise.   Social Determinants of Health   Financial Resource Strain:   . Difficulty of Paying Living Expenses: Not on file  Food Insecurity:   . Worried  About Running Out of Food in the Last Year: Not on file  . Ran Out of Food in the Last Year: Not on file  Transportation Needs:   . Lack of Transportation (Medical): Not on file  . Lack of Transportation (Non-Medical): Not on file  Physical Activity:   . Days of Exercise per Week: Not on file  . Minutes of Exercise per Session: Not on file  Stress:   . Feeling of Stress : Not on file  Social Connections:   . Frequency of Communication with Friends and Family: Not on file  . Frequency of Social Gatherings with Friends and Family: Not on file  . Attends Religious Services: Not on file  . Active Member of Clubs or Organizations: Not on file  . Attends Archivist Meetings: Not on file  . Marital Status: Not on file  Intimate Partner Violence:   . Fear of Current or Ex-Partner: Not on file  . Emotionally Abused: Not on file  . Physically Abused: Not on file  . Sexually Abused: Not on file    Outpatient Medications Prior to Visit  Medication Sig Dispense Refill  . Cholecalciferol (VITAMIN D3 PO) Take 5,000 Units by mouth daily.    . Cyanocobalamin (VITAMIN B-12) 1000 MCG SUBL Place 1 each under the tongue every morning.     [name] econazole nitrate 1 % cream Apply topically daily. 30 g 1  . EPINEPHRINE 0.3 mg/0.3 mL IJ SOAJ injection INJECT 0.3 MLS (0.3 MG TOTAL) INTO THE MUSCLE ONCE FOR 1 DOSE 2 each 11  . ergocalciferol (VITAMIN D2) 1.25 MG (50000 UT) capsule once a week.     . famotidine (PEPCID) 40 MG tablet TAKE 1 TABLET AT BEDTIME AS  NEEDED FOR HEARTBURN OR INDIGESTION 90 tablet 3  . fenofibrate 160 MG tablet TAKE 1 TABLET DAILY 90 tablet 3  . Ferrous Gluconate-C-Folic Acid (IRON-C PO) Take by mouth. Iron 65 mg and vit C-125 mg -take one daily    . folic acid (FOLVITE) 1 MG tablet Take 1 tablet (1 mg total) by mouth daily. 30 tablet 0  . glucose blood test strip Use as directed once daily to check blood sugar.  Diagnosis code E11.9 100 each 12  . hydrochlorothiazide (MICROZIDE) 12.5 MG capsule Take 1 capsule (12.5 mg total) by mouth daily. 30 capsule 1  . HYDROcodone-acetaminophen (NORCO) 5-325 MG tablet Take 1 tablet by mouth at bedtime. 30 tablet 0  . LIVALO 1 MG TABS TAKE 1 TABLET DAILY (Patient taking differently: Take 1 mg by mouth daily. ) 90 tablet 4  . Magnesium 100 MG CAPS Take 1 capsule (100 mg total) by mouth daily. 30 capsule 1  . MELATONIN ER PO Take by mouth. Melatonin 12 mg-Take 2 at bedtime.    . metFORMIN (GLUCOPHAGE) 500 MG tablet Take 1 tablet (500 mg total) by mouth 4 (four) times daily. 360 tablet 1  . methocarbamol (ROBAXIN) 500 MG tablet TAKE ONE TABLET BY MOUTH EVERY 6 HOURS AS NEEDED FOR MUSCLE SPASMS 120 tablet 5  . metoprolol tartrate (LOPRESSOR) 50 MG tablet Take 1 tablet (50 mg total) by mouth 2 (two) times daily. 180 tablet 1  . mometasone (ELOCON) 0.1 % cream Apply 1 application topically daily. 90 g 1  . Multiple Vitamin (MULTIVITAMIN) tablet Take 1 tablet by mouth daily. BARIATRIC VIT    . NON FORMULARY CBD oil- take 1000 mg daily    . pantoprazole (PROTONIX) 40 MG tablet TAKE 1 TABLET TWICE A DAY  BEFORE MEALS 180 tablet 3  . predniSONE (DELTASONE) 5 MG tablet prednisone 5 mg tablets in a dose pack    . sodium chloride (OCEAN) 0.65 % SOLN nasal spray Place 1 spray into both nostrils as needed for congestion (nose irritation). 30 mL 0  . thiamine 100 MG tablet Take 1 tablet (100 mg total) by mouth daily. 30 tablet 0  . UNABLE TO FIND     . VITAMIN E PO Take 1 tablet by mouth daily.    [name]  gabapentin (NEURONTIN) 300 MG capsule TAKE 1 CAPSULE THREE TIMES A DAY 270 capsule 3  . fexofenadine (ALLEGRA) 180 MG tablet Take 180 mg by mouth daily.     No facility-administered medications prior to visit.    Allergies  Allergen Reactions  . Bee Venom Anaphylaxis  . Lipitor [Atorvastatin] Other (See Comments)    Myalgias, memory changes  . Morphine Other (See Comments)    hyperactive  . Hydrocodone Itching  . Pravastatin Other (See Comments)    Made joints hurt. --also simvastatin     Review of Systems  Constitutional: Positive for malaise/fatigue. Negative for fever.  HENT: Negative for congestion.   Eyes: Negative for blurred vision.  Respiratory: Negative for shortness of breath.   Cardiovascular: Negative for chest pain, palpitations and leg swelling.  Gastrointestinal: Negative for abdominal pain, blood in stool and nausea.  Genitourinary: Negative for dysuria and frequency.  Musculoskeletal: Positive for back pain, joint pain and myalgias. Negative for falls.  Skin: Negative for rash.  Neurological: Negative for dizziness, loss of consciousness and headaches.  Endo/Heme/Allergies: Negative for environmental allergies.  Psychiatric/Behavioral: Negative for depression. The patient is not nervous/anxious.        Objective:    Physical Exam unable to obtain via phone  BP (!) 150/80 (BP Location: Left Arm, Patient Position: Sitting, Cuff Size: Normal)   Pulse 80   Wt 245 lb (111.1 kg)   SpO2 96%   BMI 37.25 kg/m  Wt Readings from Last 3 Encounters:  05/09/19 245 lb (111.1 kg)  03/01/19 256 lb (116.1 kg)  11/22/18 260 lb 1.9 oz (118 kg)    Diabetic Foot Exam - Simple   No data filed     Lab Results  Component Value Date   WBC 5.6 05/03/2019   HGB 12.8 (L) 05/03/2019   HCT 38.4 (L) 05/03/2019   PLT 209.0 05/03/2019   GLUCOSE 140 (H) 05/03/2019   CHOL 143 05/03/2019   TRIG 148.0 05/03/2019   HDL 55.10 05/03/2019   LDLDIRECT 85.0 11/17/2016   LDLCALC  59 05/03/2019   ALT 14 05/03/2019   AST 19 05/03/2019   NA 137 05/03/2019   K 3.9 05/03/2019   CL 99 05/03/2019   CREATININE 1.00 05/03/2019   BUN 23 05/03/2019   CO2 29 05/03/2019   TSH 6.41 (H) 05/03/2019   PSA 1.55 06/25/2017   INR 1.3 (H) 07/06/2018   HGBA1C 6.3 05/03/2019   MICROALBUR 1.1 12/02/2015    Lab Results  Component Value Date   TSH 6.41 (H) 05/03/2019   Lab Results  Component Value Date   WBC 5.6 05/03/2019   HGB 12.8 (L) 05/03/2019   HCT 38.4 (L) 05/03/2019   MCV 91.6 05/03/2019   PLT 209.0 05/03/2019   Lab Results  Component Value Date   NA 137 05/03/2019   K 3.9 05/03/2019   CO2 29 05/03/2019   GLUCOSE 140 (H) 05/03/2019   BUN 23 05/03/2019   CREATININE 1.00 05/03/2019  BILITOT 0.7 05/03/2019   ALKPHOS 33 (L) 05/03/2019   AST 19 05/03/2019   ALT 14 05/03/2019   PROT 6.2 05/03/2019   ALBUMIN 4.3 05/03/2019   CALCIUM 9.2 05/03/2019   ANIONGAP 8 07/14/2018   GFR 75.33 05/03/2019   Lab Results  Component Value Date   CHOL 143 05/03/2019   Lab Results  Component Value Date   HDL 55.10 05/03/2019   Lab Results  Component Value Date   LDLCALC 59 05/03/2019   Lab Results  Component Value Date   TRIG 148.0 05/03/2019   Lab Results  Component Value Date   CHOLHDL 3 05/03/2019   Lab Results  Component Value Date   HGBA1C 6.3 05/03/2019       Assessment & Plan:   Problem List Items Addressed This Visit    T2DM (type 2 diabetes mellitus) ([place])    hgba1c acceptable, minimize simple carbs. Increase exercise as tolerated. Continue current meds      Essential hypertension    Monitor and report any concerns, no changes to meds. Encouraged heart healthy diet such as the DASH diet and exercise as tolerated.       Anemia    Increase leafy greens, consider increased lean red meat and using cast iron cookware. Continue to monitor, report any concerns. He just had an iron infusion recently and continues bariatric iron      Vitamin D  deficiency    Supplement and monitor      Back pain    Encouraged moist heat and gentle stretching as tolerated. May try NSAIDs and prescription meds as directed and report if symptoms worsen or seek immediate care. He reports his Tramadol is no longer helping as much. It is keep ing him up at night and is worse when he is on his feet for a prolonged period of time. Will try increasing Gabapentin to 300 mg bid and 600 mg qhs and Tylenol 500 mg bid scheduled. Reassess after a few weeks. His drug screen did show some alcohol and he is encouraged not to drink notably when he takes pain meds.       Dry eyes    Encouraged to hydrate well and use hydrating eye drops he reports his symptoms are tolerable and stable.         I have changed Brent [name] "Brent [name]"'s gabapentin. I am also having him maintain his multivitamin, Vitamin B-12, glucose blood, Magnesium, Livalo, VITAMIN E PO, folic acid, thiamine, sodium chloride, ergocalciferol, Cholecalciferol (VITAMIN D3 PO), Ferrous Gluconate-C-Folic Acid (IRON-C PO), MELATONIN ER PO, NON FORMULARY, fexofenadine, methocarbamol, econazole nitrate, pantoprazole, metoprolol tartrate, fenofibrate, mometasone, predniSONE, UNABLE TO FIND, EPINEPHrine, famotidine, metFORMIN, hydrochlorothiazide, and HYDROcodone-acetaminophen.  Meds ordered this encounter  Medications  . gabapentin (NEURONTIN) 300 MG capsule    Sig: 1 tab po bid and 2 tabs po qhs    Dispense:  360 capsule    Refill:  1     I discussed the assessment and treatment plan with the patient. The patient was provided an opportunity to ask questions and all were answered. The patient agreed with the plan and demonstrated an understanding of the instructions.   The patient was advised to call back or seek an in-person evaluation if the symptoms worsen or if the condition fails to improve as anticipated.  I provided home minutes of non-face-to-face time during this encounter.   [name], MD

## 2019-05-14 NOTE — Assessment & Plan Note (Signed)
Encouraged moist heat and gentle stretching as tolerated. May try NSAIDs and prescription meds as directed and report if symptoms worsen or seek immediate care. He reports his Tramadol is no longer helping as much. It is keep ing him up at night and is worse when he is on his feet for a prolonged period of time. Will try increasing Gabapentin to 300 mg bid and 600 mg qhs and Tylenol 500 mg bid scheduled. Reassess after a few weeks. His drug screen did show some alcohol and he is encouraged not to drink notably when he takes pain meds.

## 2019-05-23 ENCOUNTER — Encounter: Payer: Self-pay | Admitting: Family Medicine

## 2019-05-23 MED ORDER — METHOCARBAMOL 500 MG PO TABS: ORAL_TABLET | ORAL | 2 refills | Status: DC

## 2019-05-23 NOTE — Telephone Encounter (Signed)
Medication refilled

## 2019-05-26 ENCOUNTER — Encounter: Payer: Self-pay | Admitting: Family Medicine

## 2019-05-29 ENCOUNTER — Other Ambulatory Visit: Payer: Self-pay | Admitting: Family Medicine

## 2019-05-29 DIAGNOSIS — M1A9XX0 Chronic gout, unspecified, without tophus (tophi): Secondary | ICD-10-CM

## 2019-05-29 DIAGNOSIS — I2510 Atherosclerotic heart disease of native coronary artery without angina pectoris: Secondary | ICD-10-CM

## 2019-05-29 DIAGNOSIS — M25551 Pain in right hip: Secondary | ICD-10-CM

## 2019-05-29 DIAGNOSIS — I10 Essential (primary) hypertension: Secondary | ICD-10-CM

## 2019-05-29 DIAGNOSIS — M546 Pain in thoracic spine: Secondary | ICD-10-CM

## 2019-05-29 DIAGNOSIS — M25562 Pain in left knee: Secondary | ICD-10-CM

## 2019-05-29 DIAGNOSIS — M545 Low back pain, unspecified: Secondary | ICD-10-CM

## 2019-05-29 DIAGNOSIS — E1100 Type 2 diabetes mellitus with hyperosmolarity without nonketotic hyperglycemic-hyperosmolar coma (NKHHC): Secondary | ICD-10-CM

## 2019-05-29 DIAGNOSIS — E782 Mixed hyperlipidemia: Secondary | ICD-10-CM

## 2019-05-29 DIAGNOSIS — G8929 Other chronic pain: Secondary | ICD-10-CM

## 2019-05-29 DIAGNOSIS — M25552 Pain in left hip: Secondary | ICD-10-CM

## 2019-05-29 DIAGNOSIS — M25519 Pain in unspecified shoulder: Secondary | ICD-10-CM

## 2019-05-29 MED ORDER — METOPROLOL SUCCINATE ER 25 MG PO TB24: 25.0000 mg | ORAL_TABLET | Freq: Every day | ORAL | 1 refills | Status: DC

## 2019-05-29 NOTE — Telephone Encounter (Signed)
Please advise 

## 2019-06-01 ENCOUNTER — Inpatient Hospital Stay: Attending: Hematology & Oncology

## 2019-06-01 ENCOUNTER — Inpatient Hospital Stay (HOSPITAL_BASED_OUTPATIENT_CLINIC_OR_DEPARTMENT_OTHER): Admitting: Family

## 2019-06-01 ENCOUNTER — Encounter: Payer: Self-pay | Admitting: Physical Medicine and Rehabilitation

## 2019-06-01 ENCOUNTER — Telehealth: Payer: Self-pay | Admitting: Family

## 2019-06-01 ENCOUNTER — Other Ambulatory Visit: Payer: Self-pay

## 2019-06-01 ENCOUNTER — Encounter: Payer: Self-pay | Admitting: Family

## 2019-06-01 VITALS — BP 164/74 | HR 82 | Temp 97.1°F | Resp 18 | Ht 68.0 in | Wt 264.0 lb

## 2019-06-01 DIAGNOSIS — K909 Intestinal malabsorption, unspecified: Secondary | ICD-10-CM | POA: Diagnosis not present

## 2019-06-01 DIAGNOSIS — R0602 Shortness of breath: Secondary | ICD-10-CM | POA: Insufficient documentation

## 2019-06-01 DIAGNOSIS — D500 Iron deficiency anemia secondary to blood loss (chronic): Secondary | ICD-10-CM

## 2019-06-01 DIAGNOSIS — Z8711 Personal history of peptic ulcer disease: Secondary | ICD-10-CM | POA: Diagnosis not present

## 2019-06-01 DIAGNOSIS — Z9884 Bariatric surgery status: Secondary | ICD-10-CM | POA: Diagnosis not present

## 2019-06-01 LAB — CBC WITH DIFFERENTIAL (CANCER CENTER ONLY)
Abs Immature Granulocytes: 0.07 10*3/uL (ref 0.00–0.07)
Basophils Absolute: 0 10*3/uL (ref 0.0–0.1)
Basophils Relative: 0 %
Eosinophils Absolute: 0.3 10*3/uL (ref 0.0–0.5)
Eosinophils Relative: 4 %
HCT: 42.6 % (ref 39.0–52.0)
Hemoglobin: 14.2 g/dL (ref 13.0–17.0)
Immature Granulocytes: 1 %
Lymphocytes Relative: 16 %
Lymphs Abs: 1.1 10*3/uL (ref 0.7–4.0)
MCH: 30.7 pg (ref 26.0–34.0)
MCHC: 33.3 g/dL (ref 30.0–36.0)
MCV: 92 fL (ref 80.0–100.0)
Monocytes Absolute: 0.7 10*3/uL (ref 0.1–1.0)
Monocytes Relative: 11 %
Neutro Abs: 4.7 10*3/uL (ref 1.7–7.7)
Neutrophils Relative %: 68 %
Platelet Count: 210 10*3/uL (ref 150–400)
RBC: 4.63 MIL/uL (ref 4.22–5.81)
RDW: 12.6 % (ref 11.5–15.5)
WBC Count: 6.8 10*3/uL (ref 4.0–10.5)
nRBC: 0 % (ref 0.0–0.2)

## 2019-06-01 LAB — RETICULOCYTES
Immature Retic Fract: 9.8 % (ref 2.3–15.9)
RBC.: 4.59 MIL/uL (ref 4.22–5.81)
Retic Count, Absolute: 101.9 10*3/uL (ref 19.0–186.0)
Retic Ct Pct: 2.2 % (ref 0.4–3.1)

## 2019-06-01 NOTE — Progress Notes (Signed)
Hematology and Oncology Follow Up Visit  Brent [name] [id number] Aug 23, 1955 64 y.o. 06/01/2019   Principle Diagnosis:  Iron deficiency anemia secondary tomalabsorptionand history ofacute GI bleed  Current Therapy:   IV iron as indicated    Interim History:  Brent [name] is here today for follow-up. He is doing quite well but does have some generalized aches and pains with the cold damp weather.  He does experience mild SOB with over exertion and takes a break to rest as needed.  He has not noted any blood loss. No bruising or petechiae.  Hgb is stable at 14.2, MCV 92.0. No fever, chills, n/v, cough, rash, dizziness, chest pain, palpitations, abdominal pain or changes in bowel or bladder habits.  No swelling, tenderness, numbness or tingling in his extremities at this time.  No falls or syncopal episodes to report.  He is eating well and hydrating properly. His weight is stable.   ECOG Performance Status: 0 - Asymptomatic  Medications:  Allergies as of 06/01/2019      Reactions   Bee Venom Anaphylaxis   Lipitor [atorvastatin] Other (See Comments)   Myalgias, memory changes   Morphine Other (See Comments)   hyperactive   Hydrocodone Itching   Pravastatin Other (See Comments)   Made joints hurt. --also simvastatin       Medication List       Accurate as of June 01, 2019  8:47 AM. If you have any questions, ask your nurse or doctor.        econazole nitrate 1 % cream Apply topically daily.   EPINEPHrine 0.3 mg/0.3 mL Soaj injection Commonly known as: EPI-PEN INJECT 0.3 MLS (0.3 MG TOTAL) INTO THE MUSCLE ONCE FOR 1 DOSE   ergocalciferol 1.25 MG (50000 UT) capsule Commonly known as: VITAMIN D2 once a week.   famotidine 40 MG tablet Commonly known as: PEPCID TAKE 1 TABLET AT BEDTIME AS NEEDED FOR HEARTBURN OR INDIGESTION   fenofibrate 160 MG tablet TAKE 1 TABLET DAILY   fexofenadine 180 MG tablet Commonly known as: ALLEGRA Take 180 mg by mouth daily.    folic acid 1 MG tablet Commonly known as: FOLVITE Take 1 tablet (1 mg total) by mouth daily.   gabapentin 300 MG capsule Commonly known as: NEURONTIN 1 tab po bid and 2 tabs po qhs   glucose blood test strip Use as directed once daily to check blood sugar.  Diagnosis code E11.9   hydrochlorothiazide 12.5 MG capsule Commonly known as: Microzide Take 1 capsule (12.5 mg total) by mouth daily.   HYDROcodone-acetaminophen 5-325 MG tablet Commonly known as: Norco Take 1 tablet by mouth at bedtime.   IRON-C PO Take by mouth. Iron 65 mg and vit C-125 mg -take one daily   Livalo 1 MG Tabs Generic drug: Pitavastatin Calcium TAKE 1 TABLET DAILY What changed: how much to take   Magnesium 100 MG Caps Take 1 capsule (100 mg total) by mouth daily.   MELATONIN ER PO Take by mouth. Melatonin 12 mg-Take 2 at bedtime.   metFORMIN 500 MG tablet Commonly known as: GLUCOPHAGE Take 1 tablet (500 mg total) by mouth 4 (four) times daily.   methocarbamol 500 MG tablet Commonly known as: ROBAXIN TAKE ONE TABLET BY MOUTH EVERY 6 HOURS AS NEEDED FOR MUSCLE SPASMS   metoprolol succinate 25 MG 24 hr tablet Commonly known as: TOPROL-XL Take 1 tablet (25 mg total) by mouth daily.   metoprolol tartrate 50 MG tablet Commonly known as: LOPRESSOR Take 1 tablet (  50 mg total) by mouth 2 (two) times daily.   mometasone 0.1 % cream Commonly known as: Elocon Apply 1 application topically daily.   multivitamin tablet Take 1 tablet by mouth daily. BARIATRIC VIT   NON FORMULARY CBD oil- take 1000 mg daily   pantoprazole 40 MG tablet Commonly known as: PROTONIX TAKE 1 TABLET TWICE A DAY BEFORE MEALS   predniSONE 5 MG tablet Commonly known as: DELTASONE prednisone 5 mg tablets in a dose pack   sodium chloride 0.65 % Soln nasal spray Commonly known as: OCEAN Place 1 spray into both nostrils as needed for congestion (nose irritation).   thiamine 100 MG tablet Take 1 tablet (100 mg total) by  mouth daily.   UNABLE TO FIND   Vitamin B-12 1000 MCG Subl Place 1 each under the tongue every morning.   VITAMIN D3 PO Take 5,000 Units by mouth daily.   VITAMIN E PO Take 1 tablet by mouth daily.       Allergies:  Allergies  Allergen Reactions  . Bee Venom Anaphylaxis  . Lipitor [Atorvastatin] Other (See Comments)    Myalgias, memory changes  . Morphine Other (See Comments)    hyperactive  . Hydrocodone Itching  . Pravastatin Other (See Comments)    Made joints hurt. --also simvastatin     Past Medical History, Surgical history, Social history, and Family History were reviewed and updated.  Review of Systems: All other 10 point review of systems is negative.   Physical Exam:  vitals were not taken for this visit.   Wt Readings from Last 3 Encounters:  05/09/19 245 lb (111.1 kg)  03/01/19 256 lb (116.1 kg)  11/22/18 260 lb 1.9 oz (118 kg)    Ocular: Sclerae unicteric, pupils equal, round and reactive to light Ear-nose-throat: Oropharynx clear, dentition fair Lymphatic: No cervical or supraclavicular adenopathy Lungs no rales or rhonchi, good excursion bilaterally Heart regular rate and rhythm, no murmur appreciated Abd soft, nontender, positive bowel sounds, no liver or spleen tip palpated on exam, no fluid wave  MSK no focal spinal tenderness, no joint edema Neuro: non-focal, well-oriented, appropriate affect Breasts: Deferred   Lab Results  Component Value Date   WBC 6.8 06/01/2019   HGB 14.2 06/01/2019   HCT 42.6 06/01/2019   MCV 92.0 06/01/2019   PLT 210 06/01/2019   Lab Results  Component Value Date   FERRITIN 178 03/01/2019   IRON 68 03/01/2019   TIBC 311 03/01/2019   UIBC 243 03/01/2019   IRONPCTSAT 22 03/01/2019   Lab Results  Component Value Date   RETICCTPCT 2.2 06/01/2019   RBC 4.59 06/01/2019   RBC 4.63 06/01/2019   No results found for: KPAFRELGTCHN, LAMBDASER, KAPLAMBRATIO No results found for: IGGSERUM, IGA, IGMSERUM No  results found for: Odetta Pink, SPEI   Chemistry      Component Value Date/Time   NA 137 05/03/2019 0755   K 3.9 05/03/2019 0755   CL 99 05/03/2019 0755   CO2 29 05/03/2019 0755   BUN 23 05/03/2019 0755   CREATININE 1.00 05/03/2019 0755   CREATININE 0.95 07/14/2018 1021   CREATININE 0.97 11/21/2013 0948      Component Value Date/Time   CALCIUM 9.2 05/03/2019 0755   ALKPHOS 33 (L) 05/03/2019 0755   AST 19 05/03/2019 0755   AST 15 07/14/2018 1021   ALT 14 05/03/2019 0755   ALT 11 07/14/2018 1021   BILITOT 0.7 05/03/2019 0755   BILITOT 0.4  07/14/2018 1021       Impression and Plan: Mr. Routon is a very pleasant 64 yo caucasian gentleman with iron deficiency anemia secondary to malabsorption (gastric bypass) as well intermittent GI blood loss with ulcer. We will see what his iron studies look like and replace if needed.  We will see him back in another 4 months.  He will contact our office with any questions or concerns. We can certainly see him sooner if needed.   Laverna Peace, NP 2/11/20218:47 AM

## 2019-06-01 NOTE — Telephone Encounter (Signed)
Appointments scheduled calendar printed per 2/11 los 

## 2019-06-02 LAB — FERRITIN: Ferritin: 136 ng/mL (ref 24–336)

## 2019-06-02 LAB — IRON AND TIBC
Iron: 137 ug/dL (ref 42–163)
Saturation Ratios: 34 % (ref 20–55)
TIBC: 399 ug/dL (ref 202–409)
UIBC: 262 ug/dL (ref 117–376)

## 2019-06-05 NOTE — Telephone Encounter (Signed)
LM for pt to call back re: his BP readings and to let him know he is scheduled with Dr. Marlou Porch this Wed 06/07/19 at 9 am.   ** need to do COVID screening

## 2019-06-06 ENCOUNTER — Encounter: Payer: Self-pay | Admitting: Family Medicine

## 2019-06-06 ENCOUNTER — Other Ambulatory Visit: Payer: Self-pay | Admitting: Family Medicine

## 2019-06-06 MED ORDER — HYDROCODONE-ACETAMINOPHEN 5-325 MG PO TABS: 1.0000 | ORAL_TABLET | Freq: Every day | ORAL | 0 refills | Status: DC

## 2019-06-06 NOTE — Telephone Encounter (Signed)
Requesting: Hydrocodone  Contract: none UDS:05/03/2019 Last Visit:05/09/2019 Next Visit:none Last Refill:05/04/19  Please Advise

## 2019-06-07 ENCOUNTER — Ambulatory Visit (INDEPENDENT_AMBULATORY_CARE_PROVIDER_SITE_OTHER): Admitting: Cardiology

## 2019-06-07 ENCOUNTER — Encounter: Payer: Self-pay | Admitting: *Deleted

## 2019-06-07 ENCOUNTER — Encounter: Payer: Self-pay | Admitting: Cardiology

## 2019-06-07 ENCOUNTER — Other Ambulatory Visit: Payer: Self-pay

## 2019-06-07 VITALS — BP 140/80 | HR 71 | Ht 68.0 in | Wt 264.0 lb

## 2019-06-07 DIAGNOSIS — I2510 Atherosclerotic heart disease of native coronary artery without angina pectoris: Secondary | ICD-10-CM

## 2019-06-07 DIAGNOSIS — E119 Type 2 diabetes mellitus without complications: Secondary | ICD-10-CM | POA: Diagnosis not present

## 2019-06-07 DIAGNOSIS — R002 Palpitations: Secondary | ICD-10-CM | POA: Diagnosis not present

## 2019-06-07 DIAGNOSIS — I10 Essential (primary) hypertension: Secondary | ICD-10-CM | POA: Diagnosis not present

## 2019-06-07 MED ORDER — METOPROLOL TARTRATE 100 MG PO TABS: 100.0000 mg | ORAL_TABLET | Freq: Two times a day (BID) | ORAL | 3 refills | Status: DC

## 2019-06-07 MED ORDER — LOSARTAN POTASSIUM 50 MG PO TABS: 50.0000 mg | ORAL_TABLET | Freq: Every day | ORAL | 3 refills | Status: DC

## 2019-06-07 NOTE — Patient Instructions (Signed)
Medication Instructions:  Please increase Metoprolol Tartrate to 100 mg twice a day. Start Losartan 50 mg a day. Discontinue Metoprolol Succinate if you have been taking this. Continue all other medications as listed.  *If you need a refill on your cardiac medications before your next appointment, please call your pharmacy*  Testing/Procedures: [place] Monitor Instructions   Your physician has requested you wear your ZIO patch monitor 14 days.   This is a single patch monitor.  [employer] supplies one patch monitor per enrollment.  Additional stickers are not available.   Please do not apply patch if you will be having a Nuclear Stress Test, Echocardiogram, Cardiac CT, MRI, or Chest Xray during the time frame you would be wearing the monitor. The patch cannot be worn during these tests.  You cannot remove and re-apply the ZIO XT patch monitor.   Your ZIO patch monitor will be sent USPS Priority mail from [facility] directly to your home address. The monitor may also be mailed to a PO BOX if home delivery is not available.   It may take 3-5 days to receive your monitor after you have been enrolled.   Once you have received you monitor, please review enclosed instructions.  Your monitor has already been registered assigning a specific monitor serial # to you.   Applying the monitor   Shave hair from upper left chest.   Hold abrader disc by orange tab.  Rub abrader in 40 strokes over left upper chest as indicated in your monitor instructions.   Clean area with 4 enclosed alcohol pads .  Use all pads to assure are is cleaned thoroughly.  Let dry.   Apply patch as indicated in monitor instructions.  Patch will be place under collarbone on left side of chest with arrow pointing upward.   Rub patch adhesive wings for 2 minutes.Remove white label marked "1".  Remove white label marked "2".  Rub patch adhesive wings for 2 additional minutes.   While looking in a mirror, press  and release button in center of patch.  A small green light will flash 3-4 times .  This will be your only indicator the monitor has been turned on.     Do not shower for the first 24 hours.  You may shower after the first 24 hours.   Press button if you feel a symptom. You will hear a small click.  Record Date, Time and Symptom in the Patient Log Book.   When you are ready to remove patch, follow instructions on last 2 pages of Patient Log Book.  Stick patch monitor onto last page of Patient Log Book.   Place Patient Log Book in [place] box.  Use locking tab on box and tape box closed securely.  The [employer] and [employer] has [employer] on it.  Please place in mailbox as soon as possible.  Your physician should have your test results approximately 7 days after the monitor has been mailed back to [facility].   Call [place] at [phone] if you have questions regarding your ZIO XT patch monitor.  Call them immediately if you see an orange light blinking on your monitor.   If your monitor falls off in less than 4 days contact our Monitor department at [phone].  If your monitor becomes loose or falls off after 4 days call [employer] at [phone] for suggestions on securing your monitor.   Follow-Up: At [facility], you and your health needs  are our priority.  As part of our continuing mission to provide you with exceptional heart care, we have created designated Provider Care Teams.  These Care Teams include your primary Cardiologist (physician) and Advanced Practice Providers (APPs -  Physician Assistants and Nurse Practitioners) who all work together to provide you with the care you need, when you need it.  Your next appointment:   6 week(s)  The format for your next appointment:   In Person  Provider:   Cecilie Kicks, NP  Thank you for choosing [facility]!!

## 2019-06-07 NOTE — Progress Notes (Signed)
Patient ID: Brent [name], male   DOB: January 24, 1956, 64 y.o.   MRN: [id number] Patient enrolled for [employer] to mail a 14 day ZIO XT long term holter monitor to his home.

## 2019-06-07 NOTE — Progress Notes (Signed)
Cardiology Office Note:    Date:  06/07/2019   ID:  Cid, Billick [birth year], MRN [id number]  PCP:  [name], MD  Cardiologist:  Candee Furbish, MD  Electrophysiologist:  None   Referring MD: [name], MD     History of Present Illness:    Brent [name] is a 64 y.o. male with difficulty control hypertension diabetes with hypertension, hyperlipidemia, cardiac catheterization 2013 with 70% mid RCA stenosis managed medically, 2016 nuclear stress test low risk with a EF of 55% with family history Father with CAD in his brothers here for follow-up of difficult blood pressure control.  Was previously seen by Cecilie Kicks.  He was also a patient of Dr. Claris Gladden in the past.  In summer normal BP, now 160/100. Dr. Charlett Blake doubled metoprolol but felt like heart was running away, pulse 120.  In constant pain. Ankle needs replaced. Operated in 1994. Still while sitting noting pounding.   2012 gastric bypass.  He was able to come off of medications at that time.  Past Medical History:  Diagnosis Date  . Acid reflux disease   . ACID REFLUX DISEASE 07/05/2007  . Acute gastric ulcer with bleeding   . Alcohol abuse   . Anemia   . Atherosclerosis   . Benign neoplasm of colon 07/05/2007  . Bilateral hip pain 10/05/2016  . Breast pain, left 11/17/2011  . CAD (coronary artery disease)   . Chest pain    a. Reportedly negative dobut echo performed prior to gastric bypass in 03/2011;  b. 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 12/2011 Cath: LM nl, LAD 50p, D1 41m, LCX min irregs, OM3 30, RCA 25p, 39m (FFR 0.99->0.89), PDA 30, EF 65%, Med Rx.  . COLONIC POLYPS, HX OF 10/29/2008  . Diarrhea 06/13/2010  . Diverticulosis 07/05/2018  . DIVERTICULOSIS, COLON 10/29/2008  . DM (diabetes mellitus), type 2, uncontrolled ([place])   . GI bleed   . Gout 03/04/2013  . Hearing loss 05/24/2013   Previous audiology evaluation completely.  [name] HTN (hypertension) 07/14/2010  . Hx of colonic polyps   .  HYPERSOMNIA, ASSOCIATED WITH SLEEP APNEA 07/26/2008  . Hypertension 07/14/2010  . 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 Internal hemorrhoids   . Knee pain, left 10/10/2010  . Morbid obesity ([place]) 03/27/2010   a. 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  [name] Neck pain 03/2015  . Other and unspecified hyperlipidemia 11/16/2012  . Post-operative nausea and vomiting    after the surgery in hospital in March 2020/ past the cauderization  . Preventative health care 11/17/2011  . Renal stone 11/2013  . Sleep apnea    a. CPAP  . Spinal stenosis   . Tear of meniscus of left knee 2012    Past Surgical History:  Procedure Laterality Date  . ANKLE SURGERY Right 1994  . BICEPS TENDON REPAIR     left side  . CARDIAC CATHETERIZATION     denies any chest pain in the past 2 years  . COLONOSCOPY    . colonoscopy polyps    . ESOPHAGOGASTRODUODENOSCOPY N/A 03/27/2013   Procedure: ESOPHAGOGASTRODUODENOSCOPY (EGD);  Surgeon: Irene Shipper, MD;  Location: [facility];  Service: Endoscopy;  Laterality: N/A;  . ESOPHAGOGASTRODUODENOSCOPY (EGD) WITH PROPOFOL N/A 07/06/2018   Procedure: ESOPHAGOGASTRODUODENOSCOPY (EGD) WITH PROPOFOL;  Surgeon: Jerene Bears, MD;  Location: [facility];  Service: Gastroenterology;  Laterality: N/A;  . GASTRIC BYPASS    . HERNIA REPAIR    . HOT HEMOSTASIS  N/A 07/06/2018   Procedure: HOT HEMOSTASIS (ARGON PLASMA COAGULATION/BICAP);  Surgeon: Jerene Bears, MD;  Location: [facility];  Service: Gastroenterology;  Laterality: N/A;  . KNEE ARTHROSCOPY Left 11/06/2010   Left, torn meniscus (repaired)  . LEFT HEART CATHETERIZATION WITH CORONARY ANGIOGRAM N/A 12/23/2011   Procedure: LEFT HEART CATHETERIZATION WITH CORONARY ANGIOGRAM;  Surgeon: Minus Breeding, MD;  Location: [facility] CATH LAB;  Service: Cardiovascular;  Laterality: N/A;  . REPLACEMENT TOTAL KNEE Right 01/2016   removed scar tissue/ cut tip of nerve bundle and re-route  . right knee arthroscopy Right 07/05/14   Dr. Hart Robinsons, [place].  [name] ROTATOR CUFF REPAIR  2019   left  . SCHLEROTHERAPY  07/06/2018   Procedure: Woodward Ku;  Surgeon: Jerene Bears, MD;  Location: [facility];  Service: Gastroenterology;;  . TONSILLECTOMY  age 25  . TONSILLECTOMY     as a child  . TOTAL KNEE ARTHROPLASTY Right 01/20/2016   Procedure: RIGHT TOTAL KNEE ARTHROPLASTY;  Surgeon: Paralee Cancel, MD;  Location: [facility] ORS;  Service: Orthopedics;  Laterality: Right;  . UPPER GI ENDOSCOPY  03/27/13    Current Medications: Current Meds  Medication Sig  . Cholecalciferol (VITAMIN D3 PO) Take 5,000 Units by mouth daily.  . Cyanocobalamin (VITAMIN B-12) 1000 MCG SUBL Place 1 each under the tongue every morning.   [name] econazole nitrate 1 % cream Apply topically daily.  [name] EPINEPHRINE 0.3 mg/0.3 mL IJ SOAJ injection INJECT 0.3 MLS (0.3 MG TOTAL) INTO THE MUSCLE ONCE FOR 1 DOSE  . ergocalciferol (VITAMIN D2) 1.25 MG (50000 UT) capsule once a week.   . famotidine (PEPCID) 40 MG tablet TAKE 1 TABLET AT BEDTIME AS NEEDED FOR HEARTBURN OR INDIGESTION  . fenofibrate 160 MG tablet TAKE 1 TABLET DAILY  . Ferrous Gluconate-C-Folic Acid (IRON-C PO) Take by mouth. Iron 65 mg and vit C-125 mg -take one daily  . fexofenadine (ALLEGRA) 180 MG tablet Take 180 mg by mouth daily.  . folic acid (FOLVITE) 1 MG tablet Take 1 tablet (1 mg total) by mouth daily.  [name] gabapentin (NEURONTIN) 300 MG capsule 1 tab po bid and 2 tabs po qhs  . glucose blood test strip Use as directed once daily to check blood sugar.  Diagnosis code E11.9  . hydrochlorothiazide (MICROZIDE) 12.5 MG capsule Take 1 capsule (12.5 mg total) by mouth daily.  [name] HYDROcodone-acetaminophen (NORCO) 5-325 MG tablet Take 1 tablet by mouth at bedtime.  [name] LIVALO 1 MG TABS TAKE 1 TABLET DAILY  . Magnesium 100 MG CAPS Take 1 capsule (100 mg total) by mouth daily.  [name] MELATONIN ER PO Take by mouth. Melatonin 12 mg-Take 2 at bedtime.  . metFORMIN (GLUCOPHAGE) 500 MG tablet Take 1 tablet (500 mg total) by mouth 4  (four) times daily.  . methocarbamol (ROBAXIN) 500 MG tablet TAKE ONE TABLET BY MOUTH EVERY 6 HOURS AS NEEDED FOR MUSCLE SPASMS  . mometasone (ELOCON) 0.1 % cream Apply 1 application topically daily.  . Multiple Vitamin (MULTIVITAMIN) tablet Take 1 tablet by mouth daily. BARIATRIC VIT  . pantoprazole (PROTONIX) 40 MG tablet TAKE 1 TABLET TWICE A DAY BEFORE MEALS  . sodium chloride (OCEAN) 0.65 % SOLN nasal spray Place 1 spray into both nostrils as needed for congestion (nose irritation).  . thiamine 100 MG tablet Take 1 tablet (100 mg total) by mouth daily.  [name] VITAMIN E PO Take 1 tablet by mouth daily.  . [DISCONTINUED] metoprolol succinate (TOPROL-XL) 25 MG 24 hr tablet Take 1  tablet (25 mg total) by mouth daily.  . [DISCONTINUED] metoprolol tartrate (LOPRESSOR) 50 MG tablet Take 1 tablet (50 mg total) by mouth 2 (two) times daily.     Allergies:   Bee venom, Lipitor [atorvastatin], Morphine, Hydrocodone, and Pravastatin   Social History   Socioeconomic History  . Marital status: Married    Spouse name: Not on file  . Number of children: 2  . Years of education: Not on file  . Highest education level: Not on file  Occupational History  . Occupation: TEACHER    Employer: [employer]  Tobacco Use  . Smoking status: Former Smoker    Packs/day: 1.50    Years: 20.00    Pack years: 30.00    Types: Cigarettes    Quit date: 04/21/1991    Years since quitting: 28.1  . Smokeless tobacco: Never Used  Substance and Sexual Activity  . Alcohol use: Yes    Alcohol/week: 3.0 standard drinks    Types: 3 Cans of beer per week    Comment: social  . Drug use: No  . Sexual activity: Not on file  Other Topics Concern  . Not on file  Social History Narrative   Lives with wife in [place].  Does not routinely exercise.   Social Determinants of Health   Financial Resource Strain:   . Difficulty of Paying Living Expenses: Not on file  Food Insecurity:   . Worried About Ship broker in the Last Year: Not on file  . Ran Out of Food in the Last Year: Not on file  Transportation Needs:   . Lack of Transportation (Medical): Not on file  . Lack of Transportation (Non-Medical): Not on file  Physical Activity:   . Days of Exercise per Week: Not on file  . Minutes of Exercise per Session: Not on file  Stress:   . Feeling of Stress : Not on file  Social Connections:   . Frequency of Communication with Friends and Family: Not on file  . Frequency of Social Gatherings with Friends and Family: Not on file  . Attends Religious Services: Not on file  . Active Member of Clubs or Organizations: Not on file  . Attends Archivist Meetings: Not on file  . Marital Status: Not on file     Family History: The patient's family history includes ADD / ADHD in his daughter; Colon polyps in his father; Diabetes in his brother, brother, brother, mother, and sister; Heart attack in his brother and brother; Heart attack (age of onset: 81) in his father; Heart disease in his brother, brother, and brother; Hyperlipidemia in his mother; Hypertension in his father, maternal grandmother, and mother; Obesity in his brother; Stroke in his father, mother, and sister. There is no history of Stomach cancer, Colon cancer, Esophageal cancer, or Rectal cancer.  ROS:   Please see the history of present illness.     All other systems reviewed and are negative.  EKGs/Labs/Other Studies Reviewed:    The following studies were reviewed today: Echo EF 60% with mild aortic and mitral valve regurgitation mild left atrial dilatation  EKG: EKG shows sinus rhythm 71 with no other abnormalities  Recent Labs: 07/06/2018: Magnesium 1.5 05/03/2019: ALT 14; BUN 23; Creatinine, Ser 1.00; Potassium 3.9; Sodium 137; TSH 6.41 06/01/2019: Hemoglobin 14.2; Platelet Count 210  Recent Lipid Panel    Component Value Date/Time   CHOL 143 05/03/2019 0755   TRIG 148.0 05/03/2019 0755   HDL 55.10  05/03/2019  0755   CHOLHDL 3 05/03/2019 0755   VLDL 29.6 05/03/2019 0755   LDLCALC 59 05/03/2019 0755   LDLDIRECT 85.0 11/17/2016 0928    Physical Exam:    VS:  BP 140/80   Pulse 71   Ht 5\' 8"  (1.727 m)   Wt 264 lb (119.7 kg)   SpO2 96%   BMI 40.14 kg/m     Wt Readings from Last 3 Encounters:  06/07/19 264 lb (119.7 kg)  06/01/19 264 lb (119.7 kg)  05/09/19 245 lb (111.1 kg)     GEN: Overweight Well nourished, well developed in no acute distress HEENT: Normal NECK: No JVD; No carotid bruits LYMPHATICS: No lymphadenopathy CARDIAC: RRR, no murmurs, rubs, gallops RESPIRATORY:  Clear to auscultation without rales, wheezing or rhonchi  ABDOMEN: Soft, non-tender, non-distended MUSCULOSKELETAL:  No edema; No deformity  SKIN: Warm and dry NEUROLOGIC:  Alert and oriented x 3 PSYCHIATRIC:  Normal affect   ASSESSMENT:    1. Palpitations   2. Diabetes mellitus with coincident hypertension ([place])   3. Coronary artery disease involving native coronary artery of native heart without angina pectoris    PLAN:    In order of problems listed above:  Diabetes with hypertension difficult to control Palpitations -Has been managed by primary physician. -I will increase the metoprolol tartrate to 100 mg twice a day to help with not only with his palpitations but to help a little bit with his blood pressure, remembering that beta-blockers do not work extremely well on blood pressure. -I will start him on losartan 50 mg once a day.  He used to be on Altace many years ago. -Continue with HCTZ 12.5 once a day. -Check a Zio patch monitor 14 days.  Lets make sure that this fast arrhythmia is not an adverse arrhythmia.   Nonobstructive coronary artery disease -Continue with good diabetes hypertension control and L[place]o.  In 1-1/2 months, have him follow-up with Mickel Baas  Medication Adjustments/Labs and Tests Ordered: Current medicines are reviewed at length with the patient today.  Concerns regarding  medicines are outlined above.  Orders Placed This Encounter  Procedures  . LONG TERM MONITOR (3-14 DAYS)  . EKG 12-Lead   Meds ordered this encounter  Medications  . metoprolol tartrate (LOPRESSOR) 100 MG tablet    Sig: Take 1 tablet (100 mg total) by mouth 2 (two) times daily.    Dispense:  180 tablet    Refill:  3  . losartan (COZAAR) 50 MG tablet    Sig: Take 1 tablet (50 mg total) by mouth daily.    Dispense:  90 tablet    Refill:  3    Patient Instructions  Medication Instructions:  Please increase Metoprolol Tartrate to 100 mg twice a day. Start Losartan 50 mg a day. Discontinue Metoprolol Succinate if you have been taking this. Continue all other medications as listed.  *If you need a refill on your cardiac medications before your next appointment, please call your pharmacy*  Testing/Procedures: [place] Monitor Instructions   Your physician has requested you wear your ZIO patch monitor 14 days.   This is a single patch monitor.  [employer] supplies one patch monitor per enrollment.  Additional stickers are not available.   Please do not apply patch if you will be having a Nuclear Stress Test, Echocardiogram, Cardiac CT, MRI, or Chest Xray during the time frame you would be wearing the monitor. The patch cannot be worn during these tests.  You cannot  remove and re-apply the ZIO XT patch monitor.   Your ZIO patch monitor will be sent USPS Priority mail from [facility] directly to your home address. The monitor may also be mailed to a PO BOX if home delivery is not available.   It may take 3-5 days to receive your monitor after you have been enrolled.   Once you have received you monitor, please review enclosed instructions.  Your monitor has already been registered assigning a specific monitor serial # to you.   Applying the monitor   Shave hair from upper left chest.   Hold abrader disc by orange tab.  Rub abrader in 40 strokes over left upper chest  as indicated in your monitor instructions.   Clean area with 4 enclosed alcohol pads .  Use all pads to assure are is cleaned thoroughly.  Let dry.   Apply patch as indicated in monitor instructions.  Patch will be place under collarbone on left side of chest with arrow pointing upward.   Rub patch adhesive wings for 2 minutes.Remove white label marked "1".  Remove white label marked "2".  Rub patch adhesive wings for 2 additional minutes.   While looking in a mirror, press and release button in center of patch.  A small green light will flash 3-4 times .  This will be your only indicator the monitor has been turned on.     Do not shower for the first 24 hours.  You may shower after the first 24 hours.   Press button if you feel a symptom. You will hear a small click.  Record Date, Time and Symptom in the Patient Log Book.   When you are ready to remove patch, follow instructions on last 2 pages of Patient Log Book.  Stick patch monitor onto last page of Patient Log Book.   Place Patient Log Book in [place] box.  Use locking tab on box and tape box closed securely.  The [employer] has [employer] on it.  Please place in mailbox as soon as possible.  Your physician should have your test results approximately 7 days after the monitor has been mailed back to [facility]   Call [place] at [phone] if you have questions regarding your ZIO XT patch monitor.  Call them immediately if you see an orange light blinking on your monitor.   If your monitor falls off in less than 4 days contact our Monitor department at [phone].  If your monitor becomes loose or falls off after 4 days call [employer] at [phone] for suggestions on securing your monitor.   Follow-Up: At [facility], you and your health needs are our priority.  As part of our continuing mission to provide you with exceptional heart care, we have created designated Provider Care Teams.  These  Care Teams include your primary Cardiologist (physician) and Advanced Practice Providers (APPs -  Physician Assistants and Nurse Practitioners) who all work together to provide you with the care you need, when you need it.  Your next appointment:   6 week(s)  The format for your next appointment:   In Person  Provider:   Cecilie Kicks, NP  Thank you for choosing [facility]!!        Signed, Candee Furbish, MD  06/07/2019 10:31 AM    [place]

## 2019-06-08 ENCOUNTER — Other Ambulatory Visit: Payer: Self-pay | Admitting: Family Medicine

## 2019-06-08 ENCOUNTER — Encounter: Payer: Self-pay | Admitting: Family Medicine

## 2019-06-08 MED ORDER — HYDROCHLOROTHIAZIDE 12.5 MG PO CAPS: 12.5000 mg | ORAL_CAPSULE | Freq: Every day | ORAL | 1 refills | Status: DC

## 2019-06-09 ENCOUNTER — Encounter: Payer: Self-pay | Admitting: Physical Medicine and Rehabilitation

## 2019-06-09 ENCOUNTER — Other Ambulatory Visit: Payer: Self-pay

## 2019-06-09 ENCOUNTER — Encounter: Attending: Physical Medicine and Rehabilitation | Admitting: Physical Medicine and Rehabilitation

## 2019-06-09 VITALS — BP 133/74 | HR 85 | Temp 97.5°F | Ht 68.0 in | Wt 264.0 lb

## 2019-06-09 DIAGNOSIS — M48062 Spinal stenosis, lumbar region with neurogenic claudication: Secondary | ICD-10-CM | POA: Diagnosis present

## 2019-06-09 MED ORDER — METHOCARBAMOL 750 MG PO TABS: 750.0000 mg | ORAL_TABLET | Freq: Four times a day (QID) | ORAL | 1 refills | Status: DC

## 2019-06-09 NOTE — Progress Notes (Signed)
Subjective:    Patient ID: Brent [name], male    DOB: 30-Aug-1955, 64 y.o.   MRN: [id number]  HPI  Brent [name] is a 64 year old man who presents with lower back pain, shoulder pain, knee pain, and ankle pain for 1 year.   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 I have personally reviewed his shoulder and knee imaging which show quite significant pathology. His left shoulder surgery responded well to surgery but his right knee replacement has not helped much. I have personally reviewed his hip imaging which shows no arthritis. He has had spinal imaging in early 2019 but I do not see it in his system and his pain has worsened since then.  His worst pain is his low back pain. His pain feels like spasms in his lower back and radiates into a burning pain in his right anterior thigh. The pain is mostly in his lower back and only radiates once in a while.  He has been taking Gabapentin 300mg  TID with an extra 300mg  at night and Robaxin 500mg  4 times per day. He also takes Norco 5mg  at night to help him sleep. All of these have been helping with his pain but he has a very difficult time sleeping and he is only able to walk for 10 minutes without an assistive device before his pain kicks in.   Hobbies he enjoys including gardening and hunting with his grandson. He is currently retired. He has never done physical therapy.     Pain Inventory Average Pain 4 Pain Right Now 4 My pain is sharp, burning and stabbing  In the last 24 hours, has pain interfered with the following? General activity 5 Relation with others 3 Enjoyment of life 7 What TIME of day is your pain at its worst? morning Sleep (in general) Fair  Pain is worse with: bending and some activites Pain improves with: heat/ice, therapy/exercise and medication Relief from Meds: 3  Mobility walk with assistance use a cane how many minutes can you walk? 10 ability to climb steps?   yes do you drive?  yes Do you have any goals in this area?  yes  Function retired  Neuro/Psych numbness tingling  Prior Studies new  Physicians involved in your care new   Family History  Problem Relation Age of Onset  . Diabetes Mother   . Hypertension Mother   . Stroke Mother   . Hyperlipidemia Mother   . Hypertension Father   . Colon polyps Father   . Heart attack Father 59  . Stroke Father   . Heart attack Brother   . Diabetes Brother   . Heart disease Brother   . Heart attack Brother        Multiple  . Diabetes Brother   . Diabetes Sister   . Stroke Sister   . Obesity Brother   . Diabetes Brother   . Heart disease Brother   . Hypertension Maternal Grandmother   . ADD / ADHD Daughter   . Heart disease Brother   . Stomach cancer Neg Hx   . Colon cancer Neg Hx   . Esophageal cancer Neg Hx   . Rectal cancer Neg Hx    Social History   Socioeconomic History  . Marital status: Married    Spouse name: Not on file  . Number of children: 2  . Years of education: Not on file  .  Highest education level: Not on file  Occupational History  . Occupation: TEACHER    Employer: [employer]  Tobacco Use  . Smoking status: Former Smoker    Packs/day: 1.50    Years: 20.00    Pack years: 30.00    Types: Cigarettes    Quit date: 04/21/1991    Years since quitting: 28.1  . Smokeless tobacco: Never Used  Substance and Sexual Activity  . Alcohol use: Yes    Alcohol/week: 3.0 standard drinks    Types: 3 Cans of beer per week    Comment: social  . Drug use: No  . Sexual activity: Not on file  Other Topics Concern  . Not on file  Social History Narrative   Lives with wife in [place].  Does not routinely exercise.   Social Determinants of Health   Financial Resource Strain:   . Difficulty of Paying Living Expenses: Not on file  Food Insecurity:   . Worried About Charity fundraiser in the Last Year: Not on file  . Ran Out of Food in the Last Year:  Not on file  Transportation Needs:   . Lack of Transportation (Medical): Not on file  . Lack of Transportation (Non-Medical): Not on file  Physical Activity:   . Days of Exercise per Week: Not on file  . Minutes of Exercise per Session: Not on file  Stress:   . Feeling of Stress : Not on file  Social Connections:   . Frequency of Communication with Friends and Family: Not on file  . Frequency of Social Gatherings with Friends and Family: Not on file  . Attends Religious Services: Not on file  . Active Member of Clubs or Organizations: Not on file  . Attends Archivist Meetings: Not on file  . Marital Status: Not on file   Past Surgical History:  Procedure Laterality Date  . ANKLE SURGERY Right 1994  . BICEPS TENDON REPAIR     left side  . CARDIAC CATHETERIZATION     denies any chest pain in the past 2 years  . COLONOSCOPY    . colonoscopy polyps    . ESOPHAGOGASTRODUODENOSCOPY N/A 03/27/2013   Procedure: ESOPHAGOGASTRODUODENOSCOPY (EGD);  Surgeon: Irene Shipper, MD;  Location: [facility];  Service: Endoscopy;  Laterality: N/A;  . ESOPHAGOGASTRODUODENOSCOPY (EGD) WITH PROPOFOL N/A 07/06/2018   Procedure: ESOPHAGOGASTRODUODENOSCOPY (EGD) WITH PROPOFOL;  Surgeon: Jerene Bears, MD;  Location: [facility];  Service: Gastroenterology;  Laterality: N/A;  . GASTRIC BYPASS    . HERNIA REPAIR    . HOT HEMOSTASIS N/A 07/06/2018   Procedure: HOT HEMOSTASIS (ARGON PLASMA COAGULATION/BICAP);  Surgeon: Jerene Bears, MD;  Location: [facility];  Service: Gastroenterology;  Laterality: N/A;  . KNEE ARTHROSCOPY Left 11/06/2010   Left, torn meniscus (repaired)  . LEFT HEART CATHETERIZATION WITH CORONARY ANGIOGRAM N/A 12/23/2011   Procedure: LEFT HEART CATHETERIZATION WITH CORONARY ANGIOGRAM;  Surgeon: Minus Breeding, MD;  Location: [facility] CATH LAB;  Service: Cardiovascular;  Laterality: N/A;  . REPLACEMENT TOTAL KNEE Right 01/2016   removed scar tissue/ cut tip of nerve bundle and re-route   . right knee arthroscopy Right 07/05/14   Dr. Hart Robinsons, [place].  [name] ROTATOR CUFF REPAIR  2019   left  . SCHLEROTHERAPY  07/06/2018   Procedure: Woodward Ku;  Surgeon: Jerene Bears, MD;  Location: [facility];  Service: Gastroenterology;;  . TONSILLECTOMY  age 51  . TONSILLECTOMY     as a child  .  TOTAL KNEE ARTHROPLASTY Right 01/20/2016   Procedure: RIGHT TOTAL KNEE ARTHROPLASTY;  Surgeon: Paralee Cancel, MD;  Location: [facility];  Service: Orthopedics;  Laterality: Right;  . UPPER GI ENDOSCOPY  03/27/13   Past Medical History:  Diagnosis Date  . Acid reflux disease   . ACID REFLUX DISEASE 07/05/2007  . Acute gastric ulcer with bleeding   . Alcohol abuse   . Anemia   . Atherosclerosis   . Benign neoplasm of colon 07/05/2007  . Bilateral hip pain 10/05/2016  . Breast pain, left 11/17/2011  . CAD (coronary artery disease)   . Chest pain    a. Reportedly negative dobut echo performed prior to gastric bypass in 03/2011;  b. 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 12/2011 Cath: LM nl, LAD 50p, D1 52m, LCX min irregs, OM3 30, RCA 25p, 39m (FFR 0.99->0.89), PDA 30, EF 65%, Med Rx.  . COLONIC POLYPS, HX OF 10/29/2008  . Diarrhea 06/13/2010  . Diverticulosis 07/05/2018  . DIVERTICULOSIS, COLON 10/29/2008  . DM (diabetes mellitus), type 2, uncontrolled ([place])   . GI bleed   . Gout 03/04/2013  . Hearing loss 05/24/2013   Previous audiology evaluation completely.  [name] HTN (hypertension) 07/14/2010  . Hx of colonic polyps   . HYPERSOMNIA, ASSOCIATED WITH SLEEP APNEA 07/26/2008  . Hypertension 07/14/2010  . 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 Internal hemorrhoids   . Knee pain, left 10/10/2010  . Morbid obesity ([place]) 03/27/2010   a. 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  [name] Neck pain 03/2015  . Other and unspecified hyperlipidemia 11/16/2012  . Post-operative nausea and vomiting    after the surgery in hospital in March 2020/ past the cauderization  . Preventative health care 11/17/2011  . Renal stone  11/2013  . Sleep apnea    a. CPAP  . Spinal stenosis   . Tear of meniscus of left knee 2012   Temp (!) 97.5 F (36.4 C)   Ht 5\' 8"  (1.727 m)   Wt 264 lb (119.7 kg)   BMI 40.14 kg/m   Opioid Risk Score:   Fall Risk Score:  `1  Depression screen PHQ 2/9  Depression screen [facility] 2/9 03/01/2018 11/17/2016  Decreased Interest 0 0  Down, Depressed, Hopeless 0 0  PHQ - 2 Score 0 0  Altered sleeping 3 -  Tired, decreased energy 3 -  Change in appetite 1 -  Feeling bad or failure about yourself  0 -  Trouble concentrating 0 -  Moving slowly or fidgety/restless 0 -  Suicidal thoughts 0 -  PHQ-9 Score 7 -  Some recent data might be hidden    Review of Systems  Constitutional: Negative.   HENT: Negative.   Eyes: Negative.   Respiratory: Negative.   Cardiovascular: Negative.   Gastrointestinal: Negative.   Endocrine: Negative.   Genitourinary: Negative.   Musculoskeletal: Positive for arthralgias, back pain, neck pain and neck stiffness.  Skin: Negative.   Allergic/Immunologic: Negative.   Neurological: Positive for numbness.       Tingling   Hematological: Bruises/bleeds easily.  Psychiatric/Behavioral: Negative.   All other systems reviewed and are negative.      Objective:   Physical Exam  Gen: no distress, normal appearing HEENT: oral mucosa pink and moist, NCAT Cardio: Reg rate Chest: normal effort, normal rate of breathing Abd: soft, non-distended Ext: no edema Skin: intact Neuro: AOx3 Musculoskeletal: 5/5 except for 4/5 right knee extension. Has right knee replacement scars. Able to walk with cane. Full  flexion with lumbar midline pain reproduced at end range. Full extension without pain, but pain is reproduced on oblique extension bilaterally. Negative Slump test bilaterally.  Psych: pleasant, normal affect    Assessment & Plan:  Brent [name] is a 64 year old man who presents with lower back pain, shoulder pain, knee pain, and ankle pain. The back pain is  most bothersome to him and has worsened in the last year.   1) Back pain appears most likely secondary to lumbar stenosis with neurogenic claudication given that it is worst when patient is standing and sleeping and pain is reproduced with oblique extension. --XR of lumbar spine. --Physical therapy for core and paraspinal strengthening and HEP --Increase Robaxin to 750mg  QID, provided electronic script.  --Increase Gabapentin to 300mg /300mg /900mg  --Stop Norco which is not ideal for chronic pain. We will adjust regimen so he has better control of both pain and insomnia.   I will call with results of XR once available. RTC in 1 month.

## 2019-06-10 ENCOUNTER — Ambulatory Visit (HOSPITAL_BASED_OUTPATIENT_CLINIC_OR_DEPARTMENT_OTHER)
Admission: RE | Admit: 2019-06-10 | Discharge: 2019-06-10 | Disposition: A | Discharge status: Home / Self Care | Source: Ambulatory Visit | Attending: Physical Medicine and Rehabilitation | Admitting: Physical Medicine and Rehabilitation

## 2019-06-10 DIAGNOSIS — M48062 Spinal stenosis, lumbar region with neurogenic claudication: Secondary | ICD-10-CM | POA: Diagnosis present

## 2019-06-12 NOTE — Addendum Note (Signed)
Addended by: Izora Ribas on: 06/12/2019 01:53 PM   Modules accepted: Orders

## 2019-06-14 ENCOUNTER — Other Ambulatory Visit: Payer: Self-pay | Admitting: Family Medicine

## 2019-06-15 ENCOUNTER — Other Ambulatory Visit (INDEPENDENT_AMBULATORY_CARE_PROVIDER_SITE_OTHER)

## 2019-06-15 DIAGNOSIS — R002 Palpitations: Secondary | ICD-10-CM

## 2019-07-04 ENCOUNTER — Encounter: Admitting: Physical Medicine and Rehabilitation

## 2019-07-18 ENCOUNTER — Other Ambulatory Visit: Payer: Self-pay

## 2019-07-18 ENCOUNTER — Encounter: Payer: Self-pay | Admitting: Cardiology

## 2019-07-18 ENCOUNTER — Ambulatory Visit (INDEPENDENT_AMBULATORY_CARE_PROVIDER_SITE_OTHER): Admitting: Cardiology

## 2019-07-18 VITALS — BP 132/72 | HR 80 | Ht 69.6 in | Wt 266.8 lb

## 2019-07-18 DIAGNOSIS — I2510 Atherosclerotic heart disease of native coronary artery without angina pectoris: Secondary | ICD-10-CM | POA: Diagnosis not present

## 2019-07-18 DIAGNOSIS — I10 Essential (primary) hypertension: Secondary | ICD-10-CM | POA: Diagnosis not present

## 2019-07-18 DIAGNOSIS — E782 Mixed hyperlipidemia: Secondary | ICD-10-CM

## 2019-07-18 NOTE — Progress Notes (Signed)
Cardiology Office Note   Date:  07/19/2019   ID:  [name], Brent [birth year], MRN [id number]  PCP:  [name], MD  Cardiologist:  Dr. Marlou Porch    Chief Complaint  Patient presents with  . Hypertension  . Coronary Artery Disease      History of Present Illness: Brent [name] is a 64 y.o. male who presents for follow up of CAD and HTN.  Pt with difficulty to control hypertension diabetes with hypertension, hyperlipidemia, cardiac catheterization 2013 with 70% mid RCA stenosis managed medically, 2016 nuclear stress test low risk with a EF of 55% with family history Father with CAD in his brothers   He was also a patient of Dr. Claris Gladden in the past.  In summer normal BP, now 160/100. Dr. Charlett Blake doubled metoprolol but felt like heart was running away, pulse 120.  In constant pain. Ankle needs replaced. Operated in 1994. Still while sitting noting pounding.   2012 gastric bypass.  He was able to come off of medications at that time.? Sleep apnea   Saw Dr. Marlou Porch in 05/2019 and lopressor up to 100 mg BID losartan 50 mg dialy.  zio patch for palpitations  On statin. DM per PCP  Zio patch SR HR av of 81, rare PVCs occ symptomatic rare PAT 4-12 beats benign no pauses no a fib.    Back today for BP eval and symptom eval.  BP is improved and he is feeling well without chest pain or SOB.    Past Medical History:  Diagnosis Date  . Acid reflux disease   . ACID REFLUX DISEASE 07/05/2007  . Acute gastric ulcer with bleeding   . Alcohol abuse   . Anemia   . Atherosclerosis   . Benign neoplasm of colon 07/05/2007  . Bilateral hip pain 10/05/2016  . Breast pain, left 11/17/2011  . CAD (coronary artery disease)   . Chest pain    a. Reportedly negative dobut echo performed prior to gastric bypass in 03/2011;  b. 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 12/2011 Cath: LM nl, LAD 50p, D1 59m, LCX min irregs, OM3 30, RCA 25p, 28m (FFR 0.99->0.89), PDA 30, EF 65%, Med Rx.  . COLONIC  POLYPS, HX OF 10/29/2008  . Diarrhea 06/13/2010  . Diverticulosis 07/05/2018  . DIVERTICULOSIS, COLON 10/29/2008  . DM (diabetes mellitus), type 2, uncontrolled ([place])   . GI bleed   . Gout 03/04/2013  . Hearing loss 05/24/2013   Previous audiology evaluation completely.  [name] HTN (hypertension) 07/14/2010  . Hx of colonic polyps   . HYPERSOMNIA, ASSOCIATED WITH SLEEP APNEA 07/26/2008  . Hypertension 07/14/2010  . 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 Internal hemorrhoids   . Knee pain, left 10/10/2010  . Morbid obesity ([place]) 03/27/2010   a. 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  [name] Neck pain 03/2015  . Other and unspecified hyperlipidemia 11/16/2012  . Post-operative nausea and vomiting    after the surgery in hospital in March 2020/ past the cauderization  . Preventative health care 11/17/2011  . Renal stone 11/2013  . Sleep apnea    a. CPAP  . Spinal stenosis   . Tear of meniscus of left knee 2012    Past Surgical History:  Procedure Laterality Date  . ANKLE SURGERY Right 1994  . BICEPS TENDON REPAIR     left side  . CARDIAC CATHETERIZATION     denies any chest pain in the past 2 years  .  COLONOSCOPY    . colonoscopy polyps    . ESOPHAGOGASTRODUODENOSCOPY N/A 03/27/2013   Procedure: ESOPHAGOGASTRODUODENOSCOPY (EGD);  Surgeon: Irene Shipper, MD;  Location: [facility];  Service: Endoscopy;  Laterality: N/A;  . ESOPHAGOGASTRODUODENOSCOPY (EGD) WITH PROPOFOL N/A 07/06/2018   Procedure: ESOPHAGOGASTRODUODENOSCOPY (EGD) WITH PROPOFOL;  Surgeon: Jerene Bears, MD;  Location: [facility];  Service: Gastroenterology;  Laterality: N/A;  . GASTRIC BYPASS    . HERNIA REPAIR    . HOT HEMOSTASIS N/A 07/06/2018   Procedure: HOT HEMOSTASIS (ARGON PLASMA COAGULATION/BICAP);  Surgeon: Jerene Bears, MD;  Location: [facility];  Service: Gastroenterology;  Laterality: N/A;  . KNEE ARTHROSCOPY Left 11/06/2010   Left, torn meniscus (repaired)  . LEFT HEART CATHETERIZATION WITH CORONARY ANGIOGRAM N/A 12/23/2011    Procedure: LEFT HEART CATHETERIZATION WITH CORONARY ANGIOGRAM;  Surgeon: Minus Breeding, MD;  Location: [facility] CATH LAB;  Service: Cardiovascular;  Laterality: N/A;  . REPLACEMENT TOTAL KNEE Right 01/2016   removed scar tissue/ cut tip of nerve bundle and re-route  . right knee arthroscopy Right 07/05/14   Dr. Hart Robinsons, [place].  [name] ROTATOR CUFF REPAIR  2019   left  . SCHLEROTHERAPY  07/06/2018   Procedure: Woodward Ku;  Surgeon: Jerene Bears, MD;  Location: [facility];  Service: Gastroenterology;;  . TONSILLECTOMY  age 39  . TONSILLECTOMY     as a child  . TOTAL KNEE ARTHROPLASTY Right 01/20/2016   Procedure: RIGHT TOTAL KNEE ARTHROPLASTY;  Surgeon: Paralee Cancel, MD;  Location: [facility];  Service: Orthopedics;  Laterality: Right;  . UPPER GI ENDOSCOPY  03/27/13     Current Outpatient Medications  Medication Sig Dispense Refill  . cetirizine (ZYRTEC) 10 MG tablet Take 10 mg by mouth daily.    . Cholecalciferol (VITAMIN D3 PO) Take 5,000 Units by mouth daily.    . Cyanocobalamin (VITAMIN B-12) 1000 MCG SUBL Place 1 each under the tongue every morning.     [name] econazole nitrate 1 % cream Apply topically daily. 30 g 1  . EPINEPHRINE 0.3 mg/0.3 mL IJ SOAJ injection INJECT 0.3 MLS (0.3 MG TOTAL) INTO THE MUSCLE ONCE FOR 1 DOSE 2 each 11  . ergocalciferol (VITAMIN D2) 1.25 MG (50000 UT) capsule once a week.     . famotidine (PEPCID) 40 MG tablet TAKE 1 TABLET AT BEDTIME AS NEEDED FOR HEARTBURN OR INDIGESTION 90 tablet 3  . fenofibrate 160 MG tablet TAKE 1 TABLET DAILY 90 tablet 3  . Ferrous Gluconate-C-Folic Acid (IRON-C PO) Take by mouth. Iron 65 mg and vit C-125 mg -take one daily    . folic acid (FOLVITE) 1 MG tablet Take 1 tablet (1 mg total) by mouth daily. 30 tablet 0  . gabapentin (NEURONTIN) 300 MG capsule 1 tab po bid and 2 tabs po qhs 360 capsule 1  . glucose blood test strip Use as directed once daily to check blood sugar.  Diagnosis code E11.9 100 each 12  . hydrochlorothiazide  (MICROZIDE) 12.5 MG capsule Take 1 capsule (12.5 mg total) by mouth daily. 90 capsule 1  . HYDROcodone-acetaminophen (NORCO) 5-325 MG tablet Take 1 tablet by mouth at bedtime. 30 tablet 0  . LIVALO 1 MG TABS TAKE 1 TABLET DAILY 90 tablet 4  . losartan (COZAAR) 50 MG tablet Take 1 tablet (50 mg total) by mouth daily. 90 tablet 3  . Magnesium 100 MG CAPS Take 1 capsule (100 mg total) by mouth daily. 30 capsule 1  . MELATONIN ER PO Take by mouth. Melatonin  12 mg-Take 2 at bedtime.    . metFORMIN (GLUCOPHAGE) 500 MG tablet Take 1 tablet (500 mg total) by mouth 4 (four) times daily. 360 tablet 1  . methocarbamol (ROBAXIN) 750 MG tablet Take 1 tablet (750 mg total) by mouth 4 (four) times daily. 120 tablet 1  . metoprolol tartrate (LOPRESSOR) 100 MG tablet Take 1 tablet (100 mg total) by mouth 2 (two) times daily. 180 tablet 3  . mometasone (ELOCON) 0.1 % cream Apply 1 application topically daily. 90 g 1  . Multiple Vitamin (MULTIVITAMIN) tablet Take 1 tablet by mouth daily. BARIATRIC VIT    . pantoprazole (PROTONIX) 40 MG tablet TAKE 1 TABLET TWICE A DAY BEFORE MEALS 180 tablet 3  . sodium chloride (OCEAN) 0.65 % SOLN nasal spray Place 1 spray into both nostrils as needed for congestion (nose irritation). 30 mL 0  . thiamine 100 MG tablet Take 1 tablet (100 mg total) by mouth daily. 30 tablet 0  . VITAMIN E PO Take 1 tablet by mouth daily.     No current facility-administered medications for this visit.    Allergies:   Bee venom, Lipitor [atorvastatin], Morphine, Hydrocodone, and Pravastatin    Social History:  The patient  reports that he quit smoking about 28 years ago. His smoking use included cigarettes. He has a 30.00 pack-year smoking history. He has never used smokeless tobacco. He reports current alcohol use of about 3.0 standard drinks of alcohol per week. He reports that he does not use drugs.   Family History:  The patient's family history includes ADD / ADHD in his daughter; Colon  polyps in his father; Diabetes in his brother, brother, brother, mother, and sister; Heart attack in his brother and brother; Heart attack (age of onset: 34) in his father; Heart disease in his brother, brother, and brother; Hyperlipidemia in his mother; Hypertension in his father, maternal grandmother, and mother; Obesity in his brother; Stroke in his father, mother, and sister.    ROS:  General:no colds or fevers, no weight changes Skin:no rashes or ulcers HEENT:no blurred vision, no congestion CV:see HPI PUL:see HPI GI:no diarrhea constipation or melena, no indigestion GU:no hematuria, no dysuria MS:no joint pain, no claudication Neuro:no syncope, no lightheadedness Endo:+ diabetes, no thyroid disease  Wt Readings from Last 3 Encounters:  07/18/19 266 lb 12.8 oz (121 kg)  06/09/19 264 lb (119.7 kg)  06/07/19 264 lb (119.7 kg)     PHYSICAL EXAM: VS:  BP 132/72   Pulse 80   Ht 5' 9.6" (1.768 m)   Wt 266 lb 12.8 oz (121 kg)   SpO2 98%   BMI 38.72 kg/m  , BMI Body mass index is 38.72 kg/m. General:Pleasant affect, NAD Skin:Warm and dry, brisk capillary refill HEENT:normocephalic, sclera clear, mucus membranes moist Heart:S1S2 RRR without murmur, gallup, rub or click Lungs:clear without rales, rhonchi, or wheezes [id number], non tender, + BS, do not palpate liver spleen or masses Ext:no lower ext edema, 2+ pedal pulses, 2+ radial pulses Neuro:alert and oriented X 3, MAE, follows commands, + facial symmetry    EKG:  EKG is NOT ordered today.    Recent Labs: 05/03/2019: ALT 14; TSH 6.41 06/01/2019: Hemoglobin 14.2; Platelet Count 210 07/18/2019: BUN 13; Creatinine, Ser 1.03; Potassium 4.9; Sodium 137    Lipid Panel    Component Value Date/Time   CHOL 143 05/03/2019 0755   TRIG 148.0 05/03/2019 0755   HDL 55.10 05/03/2019 0755   CHOLHDL 3 05/03/2019 0755   VLDL 29.6  05/03/2019 0755   [place] 05/03/2019 0755   LDLDIRECT 85.0 11/17/2016 0928       Other studies  Reviewed: Additional studies/ records that were reviewed today include: . Echo 12/2011 Study Conclusions  - Left ventricle: The cavity size was normal. Systolic function was normal. The estimated ejection fraction was in the range of 55% to 60%. Wall motion was normal; there were no regional wall motion abnormalities. Left ventricular diastolic function parameters were normal. - Aortic valve: Mild regurgitation. - Mitral valve: Mild regurgitation. - Left atrium: The atrium was mildly dilated. - Right ventricle: Systolic function was normal. - Pulmonary arteries: Systolic pressure was within the normal range. Impressions:  - Normal study.  12/23/11 Cardiac cath Impression: 1. Moderate disease in RCA with FFR of 0.89 suggesting no significant reduction in flow down the RCA.  2. See diagnostic report for other details.   Recommendations: Medical management of moderate CAD.   Complications: None; patient tolerated the procedure well.  Procedure: Left Heart Cath, Selective Coronary Angiography, LV angiography  Indication:   Chest pain.  Coronary calcification suggesting a possible high grade RCA lesion.    Procedural details: The right groin was prepped, draped, and anesthetized with 1% lidocaine. Using modified Seldinger technique, a 5 French sheath was introduced into the right femoral artery. Standard Judkins catheters were used for coronary angiography and left ventriculography. Catheter exchanges were performed over a guidewire. There were no immediate procedural complications. The patient was transferred to the post catheterization recovery area for further monitoring.  Procedural Findings:              Hemodynamics:                                      AO 128/79                                     LV 137/11              Coronary angiography:   Coronary dominance: Right  Left mainstem:   Normal  Left anterior descending (LAD):    Proximal 50%.  Mild diffuse luminal irregularities.  D1 moderated sized with long 50% mid stenosis.  D2 and D3 small and mild irregularities.    Left circumflex (LCx):  AV groove mild luminal irregularities.  RI tiny with diffuse nonobstructive disease.  OM2 small and normal.  OM3 large with mid 30%  Right coronary artery (RCA):  Large.  Proximal 25%.  Mid 70%.  Mild luminal irregularities.  PDA moderate sized with mid 30% stenosis.  PL1 large with luminal irregularities.  PL2 small normal.    Left ventriculography: Left ventricular systolic function is normal, LVEF is estimated at 65%, there is no significant mitral regurgitation   Final Conclusions:  Diffuse nonobstructive disease.  Moderate RCA stenosis.  Recommendations: Aggressive risk reduction.  I will ask Dr. Angelena Form to evaluate the RCA with a pressure wire measurement.     ASSESSMENT AND PLAN:  1.  CAD stable no angina  2.  HTN improved control with medication management with addition of ARB will check BMP.   3.  HLD on statin and LDL 04/2019 of 59   Follow up in 6 months with Dr. Marlou Porch.    Current medicines are reviewed with the patient today.  The patient Has  no concerns regarding medicines.  The following changes have been made:  See above Labs/ tests ordered today include:see above  Disposition:   FU:  see above  Signed, Cecilie Kicks, NP  07/19/2019 1:20 PM    [facility] [place], [place], [place], [place] Phone: [phone]; Fax: [phone]

## 2019-07-18 NOTE — Patient Instructions (Signed)
Medication Instructions:  Your physician recommends that you continue on your current medications as directed. Please refer to the Current Medication list given to you today.  *If you need a refill on your cardiac medications before your next appointment, please call your pharmacy*   Lab Work: TODAY:  BMET  If you have labs (blood work) drawn today and your tests are completely normal, you will receive your results only by: [name] MyChart Message (if you have MyChart) OR . A paper copy in the mail If you have any lab test that is abnormal or we need to change your treatment, we will call you to review the results.   Testing/Procedures: None ordered   Follow-Up: At [facility], you and your health needs are our priority.  As part of our continuing mission to provide you with exceptional heart care, we have created designated Provider Care Teams.  These Care Teams include your primary Cardiologist (physician) and Advanced Practice Providers (APPs -  Physician Assistants and Nurse Practitioners) who all work together to provide you with the care you need, when you need it.  We recommend signing up for the patient portal called "MyChart".  Sign up information is provided on this After Visit Summary.  MyChart is used to connect with patients for Virtual Visits (Telemedicine).  Patients are able to view lab/test results, encounter notes, upcoming appointments, etc.  Non-urgent messages can be sent to your provider as well.   To learn more about what you can do with MyChart, go to [email or web address].    Your next appointment:   6 month(s)  The format for your next appointment:   In Person  Provider:   You may see Candee Furbish, MD or one of the following Advanced Practice Providers on your designated Care Team:    Truitt Merle, NP  Cecilie Kicks, NP  Kathyrn Drown, NP    Other Instructions

## 2019-07-19 ENCOUNTER — Other Ambulatory Visit: Payer: Self-pay

## 2019-07-19 ENCOUNTER — Encounter: Payer: Self-pay | Admitting: Cardiology

## 2019-07-19 LAB — BASIC METABOLIC PANEL
BUN/Creatinine Ratio: 13 (ref 10–24)
BUN: 13 mg/dL (ref 8–27)
CO2: 22 mmol/L (ref 20–29)
Calcium: 9.6 mg/dL (ref 8.6–10.2)
Chloride: 98 mmol/L (ref 96–106)
Creatinine, Ser: 1.03 mg/dL (ref 0.76–1.27)
GFR calc Af Amer: 89 mL/min/{1.73_m2} (ref >59)
GFR calc non Af Amer: 77 mL/min/{1.73_m2} (ref >59)
Glucose: 119 mg/dL — ABNORMAL HIGH (ref 65–99)
Potassium: 4.9 mmol/L (ref 3.5–5.2)
Sodium: 137 mmol/L (ref 134–144)

## 2019-07-20 ENCOUNTER — Encounter: Payer: Self-pay | Admitting: Family Medicine

## 2019-07-20 ENCOUNTER — Ambulatory Visit (INDEPENDENT_AMBULATORY_CARE_PROVIDER_SITE_OTHER): Admitting: Family Medicine

## 2019-07-20 ENCOUNTER — Other Ambulatory Visit: Payer: Self-pay

## 2019-07-20 VITALS — BP 122/61 | HR 82 | Temp 98.4°F | Resp 12 | Ht 68.0 in | Wt 269.8 lb

## 2019-07-20 DIAGNOSIS — R7989 Other specified abnormal findings of blood chemistry: Secondary | ICD-10-CM | POA: Diagnosis not present

## 2019-07-20 DIAGNOSIS — M545 Low back pain, unspecified: Secondary | ICD-10-CM

## 2019-07-20 DIAGNOSIS — G4733 Obstructive sleep apnea (adult) (pediatric): Secondary | ICD-10-CM | POA: Diagnosis not present

## 2019-07-20 DIAGNOSIS — Z9989 Dependence on other enabling machines and devices: Secondary | ICD-10-CM

## 2019-07-20 DIAGNOSIS — E1100 Type 2 diabetes mellitus with hyperosmolarity without nonketotic hyperglycemic-hyperosmolar coma (NKHHC): Secondary | ICD-10-CM

## 2019-07-20 DIAGNOSIS — E559 Vitamin D deficiency, unspecified: Secondary | ICD-10-CM

## 2019-07-20 DIAGNOSIS — I10 Essential (primary) hypertension: Secondary | ICD-10-CM | POA: Diagnosis not present

## 2019-07-20 DIAGNOSIS — E782 Mixed hyperlipidemia: Secondary | ICD-10-CM

## 2019-07-20 DIAGNOSIS — E538 Deficiency of other specified B group vitamins: Secondary | ICD-10-CM

## 2019-07-20 DIAGNOSIS — M542 Cervicalgia: Secondary | ICD-10-CM

## 2019-07-20 LAB — T4, FREE: Free T4: 0.96 ng/dL (ref 0.60–1.60)

## 2019-07-20 LAB — TSH: TSH: 1.77 u[IU]/mL (ref 0.35–4.50)

## 2019-07-20 MED ORDER — AMITRIPTYLINE HCL 10 MG PO TABS: 10.0000 mg | ORAL_TABLET | Freq: Every evening | ORAL | 1 refills | Status: DC | PRN

## 2019-07-20 MED ORDER — CYCLOBENZAPRINE HCL 10 MG PO TABS: 5.0000 mg | ORAL_TABLET | Freq: Three times a day (TID) | ORAL | 1 refills | Status: DC | PRN

## 2019-07-20 NOTE — Patient Instructions (Signed)
Encouraged good sleep hygiene such as dark, quiet room. No blue/green glowing lights such as computer screens in bedroom. No alcohol or stimulants in evening. Cut down on caffeine as able. Regular exercise is helpful but not just prior to bed time.  Encouraged good sleep hygiene such as dark, quiet room. No blue/green glowing lights such as computer screens in bedroom. No alcohol or stimulants in evening. Cut down on caffeine as able. Regular exercise is helpful but not just prior to bed time.  Insomnia Insomnia is a sleep disorder that makes it difficult to fall asleep or stay asleep. Insomnia can cause fatigue, low energy, difficulty concentrating, mood swings, and poor performance at work or school. There are three different ways to classify insomnia:  Difficulty falling asleep.  Difficulty staying asleep.  Waking up too early in the morning. Any type of insomnia can be long-term (chronic) or short-term (acute). Both are common. Short-term insomnia usually lasts for three months or less. Chronic insomnia occurs at least three times a week for longer than three months. What are the causes? Insomnia may be caused by another condition, situation, or substance, such as:  Anxiety.  Certain medicines.  Gastroesophageal reflux disease (GERD) or other gastrointestinal conditions.  Asthma or other breathing conditions.  Restless legs syndrome, sleep apnea, or other sleep disorders.  Chronic pain.  Menopause.  Stroke.  Abuse of alcohol, tobacco, or illegal drugs.  Mental health conditions, such as depression.  Caffeine.  Neurological disorders, such as Alzheimer's disease.  An overactive thyroid (hyperthyroidism). Sometimes, the cause of insomnia may not be known. What increases the risk? Risk factors for insomnia include:  Gender. Women are affected more often than men.  Age. Insomnia is more common as you get older.  Stress.  Lack of exercise.  Irregular work schedule  or working night shifts.  Traveling between different time zones.  Certain medical and mental health conditions. What are the signs or symptoms? If you have insomnia, the main symptom is having trouble falling asleep or having trouble staying asleep. This may lead to other symptoms, such as:  Feeling fatigued or having low energy.  Feeling nervous about going to sleep.  Not feeling rested in the morning.  Having trouble concentrating.  Feeling irritable, anxious, or depressed. How is this diagnosed? This condition may be diagnosed based on:  Your symptoms and medical history. Your health care provider may ask about: ? Your sleep habits. ? Any medical conditions you have. ? Your mental health.  A physical exam. How is this treated? Treatment for insomnia depends on the cause. Treatment may focus on treating an underlying condition that is causing insomnia. Treatment may also include:  Medicines to help you sleep.  Counseling or therapy.  Lifestyle adjustments to help you sleep better. Follow these instructions at home: Eating and drinking   Limit or avoid alcohol, caffeinated beverages, and cigarettes, especially close to bedtime. These can disrupt your sleep.  Do not eat a large meal or eat spicy foods right before bedtime. This can lead to digestive discomfort that can make it hard for you to sleep. Sleep habits   Keep a sleep diary to help you and your health care provider figure out what could be causing your insomnia. Write down: ? When you sleep. ? When you wake up during the night. ? How well you sleep. ? How rested you feel the next day. ? Any side effects of medicines you are taking. ? What you eat and drink.  Make  your bedroom a dark, comfortable place where it is easy to fall asleep. ? Put up shades or blackout curtains to block light from outside. ? Use a white noise machine to block noise. ? Keep the temperature cool.  Limit screen use before  bedtime. This includes: ? Watching TV. ? Using your smartphone, tablet, or computer.  Stick to a routine that includes going to bed and waking up at the same times every day and night. This can help you fall asleep faster. Consider making a quiet activity, such as reading, part of your nighttime routine.  Try to avoid taking naps during the day so that you sleep better at night.  Get out of bed if you are still awake after 15 minutes of trying to sleep. Keep the lights down, but try reading or doing a quiet activity. When you feel sleepy, go back to bed. General instructions  Take over-the-counter and prescription medicines only as told by your health care provider.  Exercise regularly, as told by your health care provider. Avoid exercise starting several hours before bedtime.  Use relaxation techniques to manage stress. Ask your health care provider to suggest some techniques that may work well for you. These may include: ? Breathing exercises. ? Routines to release muscle tension. ? Visualizing peaceful scenes.  Make sure that you drive carefully. Avoid driving if you feel very sleepy.  Keep all follow-up visits as told by your health care provider. This is important. Contact a health care provider if:  You are tired throughout the day.  You have trouble in your daily routine due to sleepiness.  You continue to have sleep problems, or your sleep problems get worse. Get help right away if:  You have serious thoughts about hurting yourself or someone else. If you ever feel like you may hurt yourself or others, or have thoughts about taking your own life, get help right away. You can go to your nearest emergency department or call:  Your local emergency services (911 in the [place]  A suicide crisis helpline, such as the [facility] at 450 721 6291. This is open 24 hours a day. Summary  Insomnia is a sleep disorder that makes it difficult to fall  asleep or stay asleep.  Insomnia can be long-term (chronic) or short-term (acute).  Treatment for insomnia depends on the cause. Treatment may focus on treating an underlying condition that is causing insomnia.  Keep a sleep diary to help you and your health care provider figure out what could be causing your insomnia. This information is not intended to replace advice given to you by your health care provider. Make sure you discuss any questions you have with your health care provider. Document Revised: 03/19/2017 Document Reviewed: 01/14/2017 Elsevier Patient Education  2020 [employer].

## 2019-07-22 ENCOUNTER — Other Ambulatory Visit: Payer: Self-pay | Admitting: Family Medicine

## 2019-07-24 NOTE — Assessment & Plan Note (Signed)
hgba1c acceptable, minimize simple carbs. Increase exercise as tolerated. Continue current meds 

## 2019-07-24 NOTE — Assessment & Plan Note (Signed)
Supplement and monitor 

## 2019-07-24 NOTE — Assessment & Plan Note (Signed)
Well controlled, no changes to meds. Encouraged heart healthy diet such as the DASH diet and exercise as tolerated.  °

## 2019-07-24 NOTE — Assessment & Plan Note (Addendum)
Patient continues to struggle with chronic pain in neck, shoulders, hips, knees, ankles and more for which he is on disability. Given prescription for Flexeril and Elavil 10-20 mg qhs.

## 2019-07-24 NOTE — Assessment & Plan Note (Signed)
He continues to struggle and is established with Dr Nelva Bush at [facility] now. Encouraged moist heat and gentle stretching as tolerated. May try NSAIDs and prescription meds as directed and report if symptoms worsen or seek immediate care

## 2019-07-24 NOTE — Assessment & Plan Note (Signed)
Tolerating statin, encouraged heart healthy diet, avoid trans fats, minimize simple carbs and saturated fats. Increase exercise as tolerated 

## 2019-07-24 NOTE — Progress Notes (Addendum)
Subjective:    Patient ID: Brent [name], male    DOB: December 26, 1955, 64 y.o.   MRN: [id number]  Chief Complaint  Patient presents with  . Form Completion  . Medication Management    need to discuss about changing muscle relaxer    HPI Patient is in today for follow up on chronic medical concerns. No recent febrile illness or hospitalizations. He continues to struggle with daily pain which makes it difficult to sleep or get comfortable. He is established with Dr Nelva Bush of pain management at [facility]. He notes Robaxin is not helping. His pain results in decreased ROM in neck, back, shoulders knees, ankles. Denies CP/palp/SOB/HA/congestion/fevers/GI or GU c/o. Taking meds as prescribed  Past Medical History:  Diagnosis Date  . Acid reflux disease   . ACID REFLUX DISEASE 07/05/2007  . Acute gastric ulcer with bleeding   . Alcohol abuse   . Anemia   . Atherosclerosis   . Benign neoplasm of colon 07/05/2007  . Bilateral hip pain 10/05/2016  . Breast pain, left 11/17/2011  . CAD (coronary artery disease)   . Chest pain    a. Reportedly negative dobut echo performed prior to gastric bypass in 03/2011;  b. 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 12/2011 Cath: LM nl, LAD 50p, D1 42m, LCX min irregs, OM3 30, RCA 25p, 64m (FFR 0.99->0.89), PDA 30, EF 65%, Med Rx.  . COLONIC POLYPS, HX OF 10/29/2008  . Diarrhea 06/13/2010  . Diverticulosis 07/05/2018  . DIVERTICULOSIS, COLON 10/29/2008  . DM (diabetes mellitus), type 2, uncontrolled ([place])   . GI bleed   . Gout 03/04/2013  . Hearing loss 05/24/2013   Previous audiology evaluation completely.  [name] HTN (hypertension) 07/14/2010  . Hx of colonic polyps   . HYPERSOMNIA, ASSOCIATED WITH SLEEP APNEA 07/26/2008  . Hypertension 07/14/2010  . 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 Internal hemorrhoids   . Knee pain, left 10/10/2010  . Morbid obesity ([place]) 03/27/2010   a. 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  [name] Neck pain 03/2015  . Other and unspecified  hyperlipidemia 11/16/2012  . Post-operative nausea and vomiting    after the surgery in hospital in March 2020/ past the cauderization  . Preventative health care 11/17/2011  . Renal stone 11/2013  . Sleep apnea    a. CPAP  . Spinal stenosis   . Tear of meniscus of left knee 2012    Past Surgical History:  Procedure Laterality Date  . ANKLE SURGERY Right 1994  . BICEPS TENDON REPAIR     left side  . CARDIAC CATHETERIZATION     denies any chest pain in the past 2 years  . COLONOSCOPY    . colonoscopy polyps    . ESOPHAGOGASTRODUODENOSCOPY N/A 03/27/2013   Procedure: ESOPHAGOGASTRODUODENOSCOPY (EGD);  Surgeon: Irene Shipper, MD;  Location: [facility];  Service: Endoscopy;  Laterality: N/A;  . ESOPHAGOGASTRODUODENOSCOPY (EGD) WITH PROPOFOL N/A 07/06/2018   Procedure: ESOPHAGOGASTRODUODENOSCOPY (EGD) WITH PROPOFOL;  Surgeon: Jerene Bears, MD;  Location: [facility];  Service: Gastroenterology;  Laterality: N/A;  . GASTRIC BYPASS    . HERNIA REPAIR    . HOT HEMOSTASIS N/A 07/06/2018   Procedure: HOT HEMOSTASIS (ARGON PLASMA COAGULATION/BICAP);  Surgeon: Jerene Bears, MD;  Location: [facility];  Service: Gastroenterology;  Laterality: N/A;  . KNEE ARTHROSCOPY Left 11/06/2010   Left, torn meniscus (repaired)  . LEFT HEART CATHETERIZATION WITH CORONARY ANGIOGRAM N/A 12/23/2011   Procedure: LEFT HEART CATHETERIZATION WITH CORONARY ANGIOGRAM;  Surgeon: Minus Breeding, MD;  Location[facility] LAB;  Service: Cardiovascular;  Laterality: N/A;  . REPLACEMENT TOTAL KNEE Right 01/2016   removed scar tissue/ cut tip of nerve bundle and re-route  . right knee arthroscopy Right 07/05/14   Dr. Hart Robinsons, [place].  [name] ROTATOR CUFF REPAIR  2019   left  . SCHLEROTHERAPY  07/06/2018   Procedure: Woodward Ku;  Surgeon: Jerene Bears, MD;  Location: [facility];  Service: Gastroenterology;;  . TONSILLECTOMY  age 27  . TONSILLECTOMY     as a child  . TOTAL KNEE ARTHROPLASTY Right 01/20/2016    Procedure: RIGHT TOTAL KNEE ARTHROPLASTY;  Surgeon: Paralee Cancel, MD;  Location: [facility];  Service: Orthopedics;  Laterality: Right;  . UPPER GI ENDOSCOPY  03/27/13    Family History  Problem Relation Age of Onset  . Diabetes Mother   . Hypertension Mother   . Stroke Mother   . Hyperlipidemia Mother   . Hypertension Father   . Colon polyps Father   . Heart attack Father 58  . Stroke Father   . Heart attack Brother   . Diabetes Brother   . Heart disease Brother   . Heart attack Brother        Multiple  . Diabetes Brother   . Diabetes Sister   . Stroke Sister   . Obesity Brother   . Diabetes Brother   . Heart disease Brother   . Hypertension Maternal Grandmother   . ADD / ADHD Daughter   . Heart disease Brother   . Stomach cancer Neg Hx   . Colon cancer Neg Hx   . Esophageal cancer Neg Hx   . Rectal cancer Neg Hx     Social History   Socioeconomic History  . Marital status: Married    Spouse name: Not on file  . Number of children: 2  . Years of education: Not on file  . Highest education level: Not on file  Occupational History  . Occupation: TEACHER    Employer: [employer]  Tobacco Use  . Smoking status: Former Smoker    Packs/day: 1.50    Years: 20.00    Pack years: 30.00    Types: Cigarettes    Quit date: 04/21/1991    Years since quitting: 28.3  . Smokeless tobacco: Never Used  Substance and Sexual Activity  . Alcohol use: Yes    Alcohol/week: 3.0 standard drinks    Types: 3 Cans of beer per week    Comment: social  . Drug use: No  . Sexual activity: Not on file  Other Topics Concern  . Not on file  Social History Narrative   Lives with wife in [place].  Does not routinely exercise.   Social Determinants of Health   Financial Resource Strain:   . Difficulty of Paying Living Expenses:   Food Insecurity:   . Worried About Charity fundraiser in the Last Year:   . Arboriculturist in the Last Year:   Transportation Needs:   . Lexicographer (Medical):   [name] Lack of Transportation (Non-Medical):   Physical Activity:   . Days of Exercise per Week:   . Minutes of Exercise per Session:   Stress:   . Feeling of Stress :   Social Connections:   . Frequency of Communication with Friends and Family:   . Frequency of Social Gatherings with Friends and Family:   . Attends Religious Services:   .  Active Member of Clubs or Organizations:   . Attends Archivist Meetings:   [name] Marital Status:   Intimate Partner Violence:   . Fear of Current or Ex-Partner:   . Emotionally Abused:   [name] Physically Abused:   . Sexually Abused:     Outpatient Medications Prior to Visit  Medication Sig Dispense Refill  . cetirizine (ZYRTEC) 10 MG tablet Take 10 mg by mouth daily.    . Cholecalciferol (VITAMIN D3 PO) Take 5,000 Units by mouth daily.    . Cyanocobalamin (VITAMIN B-12) 1000 MCG SUBL Place 1 each under the tongue every morning.     [name] econazole nitrate 1 % cream Apply topically daily. 30 g 1  . EPINEPHRINE 0.3 mg/0.3 mL IJ SOAJ injection INJECT 0.3 MLS (0.3 MG TOTAL) INTO THE MUSCLE ONCE FOR 1 DOSE 2 each 11  . famotidine (PEPCID) 40 MG tablet TAKE 1 TABLET AT BEDTIME AS NEEDED FOR HEARTBURN OR INDIGESTION 90 tablet 3  . fenofibrate 160 MG tablet TAKE 1 TABLET DAILY 90 tablet 3  . Ferrous Gluconate-C-Folic Acid (IRON-C PO) Take by mouth. Iron 65 mg and vit C-125 mg -take one daily    . folic acid (FOLVITE) 1 MG tablet Take 1 tablet (1 mg total) by mouth daily. 30 tablet 0  . gabapentin (NEURONTIN) 300 MG capsule 1 tab po bid and 2 tabs po qhs 360 capsule 1  . glucose blood test strip Use as directed once daily to check blood sugar.  Diagnosis code E11.9 100 each 12  . HYDROcodone-acetaminophen (NORCO) 5-325 MG tablet Take 1 tablet by mouth at bedtime. 30 tablet 0  . Magnesium 100 MG CAPS Take 1 capsule (100 mg total) by mouth daily. 30 capsule 1  . MELATONIN ER PO Take by mouth. Melatonin 12 mg-Take 2 at bedtime.    .  metFORMIN (GLUCOPHAGE) 500 MG tablet Take 1 tablet (500 mg total) by mouth 4 (four) times daily. 360 tablet 1  . methocarbamol (ROBAXIN) 750 MG tablet Take 1 tablet (750 mg total) by mouth 4 (four) times daily. 120 tablet 1  . mometasone (ELOCON) 0.1 % cream Apply 1 application topically daily. 90 g 1  . Multiple Vitamin (MULTIVITAMIN) tablet Take 1 tablet by mouth daily. BARIATRIC VIT    . pantoprazole (PROTONIX) 40 MG tablet TAKE 1 TABLET TWICE A DAY BEFORE MEALS 180 tablet 3  . sodium chloride (OCEAN) 0.65 % SOLN nasal spray Place 1 spray into both nostrils as needed for congestion (nose irritation). 30 mL 0  . thiamine 100 MG tablet Take 1 tablet (100 mg total) by mouth daily. 30 tablet 0  . VITAMIN E PO Take 1 tablet by mouth daily.    . ergocalciferol (VITAMIN D2) 1.25 MG (50000 UT) capsule once a week.     . hydrochlorothiazide (MICROZIDE) 12.5 MG capsule Take 1 capsule (12.5 mg total) by mouth daily. 90 capsule 1  . LIVALO 1 MG TABS TAKE 1 TABLET DAILY 90 tablet 4  . losartan (COZAAR) 50 MG tablet Take 1 tablet (50 mg total) by mouth daily. 90 tablet 3  . metoprolol tartrate (LOPRESSOR) 100 MG tablet Take 1 tablet (100 mg total) by mouth 2 (two) times daily. 180 tablet 3   No facility-administered medications prior to visit.    Allergies  Allergen Reactions  . Bee Venom Anaphylaxis  . Lipitor [Atorvastatin] Other (See Comments)    Myalgias, memory changes  . Morphine Other (See Comments)    hyperactive  .  Hydrocodone Itching  . Pravastatin Other (See Comments)    Made joints hurt. --also simvastatin     Review of Systems  Constitutional: Negative for fever and malaise/fatigue.  HENT: Negative for congestion.   Eyes: Negative for blurred vision.  Respiratory: Negative for shortness of breath.   Cardiovascular: Negative for chest pain, palpitations and leg swelling.  Gastrointestinal: Negative for abdominal pain, blood in stool and nausea.  Genitourinary: Negative for  dysuria and frequency.  Musculoskeletal: Positive for back pain, joint pain, myalgias and neck pain. Negative for falls.  Skin: Negative for rash.  Neurological: Negative for dizziness, loss of consciousness and headaches.  Endo/Heme/Allergies: Negative for environmental allergies.  Psychiatric/Behavioral: Negative for depression. The patient is not nervous/anxious.        Objective:    Physical Exam Vitals and nursing note reviewed.  Constitutional:      General: He is not in acute distress.    Appearance: He is well-developed.  HENT:     Head: Normocephalic and atraumatic.     Nose: Nose normal.  Eyes:     General:        Right eye: No discharge.        Left eye: No discharge.  Cardiovascular:     Rate and Rhythm: Normal rate and regular rhythm.     Heart sounds: No murmur.  Pulmonary:     Effort: Pulmonary effort is normal.     Breath sounds: Normal breath sounds.  Abdominal:     General: Bowel sounds are normal.     Palpations: Abdomen is soft.     Tenderness: There is no abdominal tenderness.  Musculoskeletal:     Cervical back: Normal range of motion and neck supple.  Skin:    General: Skin is warm and dry.  Neurological:     Mental Status: He is alert and oriented to person, place, and time.     BP 122/61 (BP Location: Right Arm, Cuff Size: Large)   Pulse 82   Temp 98.4 F (36.9 C) (Temporal)   Resp 12   Ht 5\' 8"  (624THL m)   Wt 269 lb 12.8 oz (122.4 kg)   SpO2 98%   BMI 41.02 kg/m  Wt Readings from Last 3 Encounters:  08/25/19 260 lb (117.9 kg)  07/20/19 269 lb 12.8 oz (122.4 kg)  07/18/19 266 lb 12.8 oz (121 kg)    Diabetic Foot Exam - Simple   No data filed     Lab Results  Component Value Date   WBC 6.8 06/01/2019   HGB 14.2 06/01/2019   HCT 42.6 06/01/2019   PLT 210 06/01/2019   GLUCOSE 119 (H) 07/18/2019   CHOL 143 05/03/2019   TRIG 148.0 05/03/2019   HDL 55.10 05/03/2019   LDLDIRECT 85.0 11/17/2016   LDLCALC 59 05/03/2019   ALT 14  05/03/2019   AST 19 05/03/2019   NA 137 07/18/2019   K 4.9 07/18/2019   CL 98 07/18/2019   CREATININE 1.03 07/18/2019   BUN 13 07/18/2019   CO2 22 07/18/2019   TSH 1.77 07/20/2019   PSA 1.55 06/25/2017   INR 1.3 (H) 07/06/2018   HGBA1C 6.3 05/03/2019   MICROALBUR 1.1 12/02/2015    Lab Results  Component Value Date   TSH 1.77 07/20/2019   Lab Results  Component Value Date   WBC 6.8 06/01/2019   HGB 14.2 06/01/2019   HCT 42.6 06/01/2019   MCV 92.0 06/01/2019   PLT 210 06/01/2019   Lab Results  Component Value Date   NA 137 07/18/2019   K 4.9 07/18/2019   CO2 22 07/18/2019   GLUCOSE 119 (H) 07/18/2019   BUN 13 07/18/2019   CREATININE 1.03 07/18/2019   BILITOT 0.7 05/03/2019   ALKPHOS 33 (L) 05/03/2019   AST 19 05/03/2019   ALT 14 05/03/2019   PROT 6.2 05/03/2019   ALBUMIN 4.3 05/03/2019   CALCIUM 9.6 07/18/2019   ANIONGAP 8 07/14/2018   GFR 75.33 05/03/2019   Lab Results  Component Value Date   CHOL 143 05/03/2019   Lab Results  Component Value Date   HDL 55.10 05/03/2019   Lab Results  Component Value Date   LDLCALC 59 05/03/2019   Lab Results  Component Value Date   TRIG 148.0 05/03/2019   Lab Results  Component Value Date   CHOLHDL 3 05/03/2019   Lab Results  Component Value Date   HGBA1C 6.3 05/03/2019       Assessment & Plan:   Problem List Items Addressed This Visit    T2DM (type 2 diabetes mellitus) ([place])    hgba1c acceptable, minimize simple carbs. Increase exercise as tolerated. Continue current meds      Essential hypertension    Well controlled, no changes to meds. Encouraged heart healthy diet such as the DASH diet and exercise as tolerated.       Obstructive sleep apnea on CPAP - Primary   Hyperlipidemia, mixed    Tolerating statin, encouraged heart healthy diet, avoid trans fats, minimize simple carbs and saturated fats. Increase exercise as tolerated      Vitamin D deficiency    Supplement and monitor      Neck  pain, bilateral    Patient continues to struggle with chronic pain in neck, shoulders, hips, knees, ankles and more for which he is on disability. Given prescription for Flexeril and Elavil 10-20 mg qhs.       Vitamin B12 deficiency    Supplement and monitor      Back pain    He continues to struggle and is established with Dr Nelva Bush at [facility] now. Encouraged moist heat and gentle stretching as tolerated. May try NSAIDs and prescription meds as directed and report if symptoms worsen or seek immediate care      Relevant Medications   cyclobenzaprine (FLEXERIL) 10 MG tablet    Other Visit Diagnoses    Elevated TSH       Relevant Orders   TSH (Completed)   T4, free (Completed)      I am having Brent [name] "Brent [name]" start on cyclobenzaprine. I am also having him maintain his multivitamin, Vitamin B-12, glucose blood, Magnesium, VITAMIN E PO, folic acid, thiamine, sodium chloride, Cholecalciferol (VITAMIN D3 PO), Ferrous Gluconate-C-Folic Acid (IRON-C PO), MELATONIN ER PO, econazole nitrate, pantoprazole, fenofibrate, mometasone, EPINEPHrine, famotidine, metFORMIN, gabapentin, HYDROcodone-acetaminophen, methocarbamol, and cetirizine.  Meds ordered this encounter  Medications  . DISCONTD: amitriptyline (ELAVIL) 10 MG tablet    Sig: Take 1-2 tablets (10-20 mg total) by mouth at bedtime as needed for sleep.    Dispense:  60 tablet    Refill:  1  . cyclobenzaprine (FLEXERIL) 10 MG tablet    Sig: Take 0.5-1 tablets (5-10 mg total) by mouth 3 (three) times daily as needed for muscle spasms.    Dispense:  60 tablet    Refill:  1     [name], MD

## 2019-07-25 ENCOUNTER — Other Ambulatory Visit: Payer: Self-pay

## 2019-07-25 MED ORDER — METOPROLOL TARTRATE 100 MG PO TABS: 100.0000 mg | ORAL_TABLET | Freq: Two times a day (BID) | ORAL | 3 refills | Status: DC

## 2019-07-25 MED ORDER — LOSARTAN POTASSIUM 50 MG PO TABS: 50.0000 mg | ORAL_TABLET | Freq: Every day | ORAL | 3 refills | Status: DC

## 2019-07-25 NOTE — Telephone Encounter (Signed)
Last vitamin d check was done on 05/03/19 and was 50.06.  Do you want him to continue 50000?

## 2019-07-26 ENCOUNTER — Telehealth: Payer: Self-pay | Admitting: *Deleted

## 2019-07-26 ENCOUNTER — Other Ambulatory Visit: Payer: Self-pay | Admitting: *Deleted

## 2019-07-26 ENCOUNTER — Other Ambulatory Visit: Payer: Self-pay | Admitting: Family Medicine

## 2019-07-26 MED ORDER — HYDROCHLOROTHIAZIDE 12.5 MG PO CAPS: 12.5000 mg | ORAL_CAPSULE | Freq: Every day | ORAL | 1 refills | Status: DC

## 2019-07-26 MED ORDER — AMITRIPTYLINE HCL 10 MG PO TABS: 10.0000 mg | ORAL_TABLET | Freq: Every evening | ORAL | 1 refills | Status: DC | PRN

## 2019-07-26 NOTE — Telephone Encounter (Signed)
Patient notified that attending physician form has been completed.  He would like it mailed in the envelope provided and copy mailed to him.  Form mailed and copy mailed to patient.  Copy sent to scan.

## 2019-08-25 ENCOUNTER — Telehealth (INDEPENDENT_AMBULATORY_CARE_PROVIDER_SITE_OTHER): Admitting: Family Medicine

## 2019-08-25 ENCOUNTER — Other Ambulatory Visit: Payer: Self-pay

## 2019-08-25 DIAGNOSIS — I10 Essential (primary) hypertension: Secondary | ICD-10-CM | POA: Diagnosis not present

## 2019-08-25 DIAGNOSIS — M545 Low back pain, unspecified: Secondary | ICD-10-CM

## 2019-08-25 DIAGNOSIS — F5101 Primary insomnia: Secondary | ICD-10-CM

## 2019-08-25 MED ORDER — AMITRIPTYLINE HCL 50 MG PO TABS: 50.0000 mg | ORAL_TABLET | Freq: Every day | ORAL | 1 refills | Status: DC

## 2019-08-28 NOTE — Assessment & Plan Note (Signed)
Elavil 20 mg has not given him any concerning side effects but has not helped enough. Increase to 50 mg qhs

## 2019-08-28 NOTE — Progress Notes (Signed)
Virtual Visit via Video Note  I connected with Brent [name] on 08/25/19 at  8:00 AM EDT by a video enabled telemedicine application and verified that I am speaking with the correct person using two identifiers.  Location: Patient: home Provider: home   I discussed the limitations of evaluation and management by telemedicine and the availability of in person appointments. The patient expressed understanding and agreed to proceed. Kem Boroughs, CMA was able to set the patient up on visit, video    Subjective:    Patient ID: Brent [name], male    DOB: [birth year], 64 y.o.   MRN: [id number]  No chief complaint on file.   HPI Patient is in today for follow up on chronic medical concerns. He notes the Elavil did not help his sleep substantially but he denies any concerns regarding side effects. He has been seen by his orthopaedic surgeon and they have recommended surgery and he is considering. Denies CP/palp/SOB/HA/congestion/fevers/GI or GU c/o. Taking meds as prescribed  Past Medical History:  Diagnosis Date  . Acid reflux disease   . ACID REFLUX DISEASE 07/05/2007  . Acute gastric ulcer with bleeding   . Alcohol abuse   . Anemia   . Atherosclerosis   . Benign neoplasm of colon 07/05/2007  . Bilateral hip pain 10/05/2016  . Breast pain, left 11/17/2011  . CAD (coronary artery disease)   . Chest pain    a. Reportedly negative dobut echo performed prior to gastric bypass in 03/2011;  b. 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 12/2011 Cath: LM nl, LAD 50p, D1 69m LCX min irregs, OM3 30, RCA 25p, 784mFFR 0.99->0.89), PDA 30, EF 65%, Med Rx.  . COLONIC POLYPS, HX OF 10/29/2008  . Diarrhea 06/13/2010  . Diverticulosis 07/05/2018  . DIVERTICULOSIS, COLON 10/29/2008  . DM (diabetes mellitus), type 2, uncontrolled ([facility]  . GI bleed   . Gout 03/04/2013  . Hearing loss 05/24/2013   Previous audiology evaluation completely.  . Marland Kitchen[place] (hypertension) 07/14/2010  . Hx of colonic polyps   .  HYPERSOMNIA, ASSOCIATED WITH SLEEP APNEA 07/26/2008  . Hypertension 07/14/2010  . 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 Internal hemorrhoids   . Knee pain, left 10/10/2010  . Morbid obesity ([facility]12/11/2009   a. 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  . [place]land [place] pain 03/2015  . Other and unspecified hyperlipidemia 11/16/2012  . Post-operative nausea and vomiting    after the surgery in hospital in March 2020/ past the cauderization  . Preventative health care 11/17/2011  . Renal stone 11/2013  . Sleep apnea    a. CPAP  . Spinal stenosis   . Tear of meniscus of left knee 2012    Past Surgical History:  Procedure Laterality Date  . ANKLE SURGERY Right 1994  . BICEPS TENDON REPAIR     left side  . CARDIAC CATHETERIZATION     denies any chest pain in the past 2 years  . COLONOSCOPY    . colonoscopy polyps    . ESOPHAGOGASTRODUODENOSCOPY N/A 03/27/2013   Procedure: ESOPHAGOGASTRODUODENOSCOPY (EGD);  Surgeon: JoIrene ShipperMD;  [facility];  Service: Endoscopy;  Laterality: N/A;  . ESOPHAGOGASTRODUODENOSCOPY (EGD) WITH PROPOFOL N/A 07/06/2018   Procedure: ESOPHAGOGASTRODUODENOSCOPY (EGD) WITH PROPOFOL;  Surgeon: PyJerene BearsMD;  Location: [facility];  Service: Gastroenterology;  Laterality: N/A;  . GASTRIC BYPASS    . HERNIA REPAIR    . HOT HEMOSTASIS N/A 07/06/2018   Procedure: HOT HEMOSTASIS (ARGON PLASMA  COAGULATION/BICAP);  Surgeon: Jerene Bears, MD;  [facility];  Service: Gastroenterology;  Laterality: N/A;  . KNEE ARTHROSCOPY Left 11/06/2010   Left, torn meniscus (repaired)  . LEFT HEART CATHETERIZATION WITH CORONARY ANGIOGRAM N/A 12/23/2011   Procedure: LEFT HEART CATHETERIZATION WITH CORONARY ANGIOGRAM;  Surgeon: Minus Breeding, MD;  [facility];  Service: Cardiovascular;  Laterality: N/A;  . REPLACEMENT TOTAL KNEE Right 01/2016   removed scar tissue/ cut tip of nerve bundle and re-route  . right knee arthroscopy Right 07/05/14   Dr. Hart Robinsons, [place].  [name] ROTATOR CUFF REPAIR  2019   left  . SCHLEROTHERAPY  07/06/2018   Procedure: Woodward Ku;  Surgeon: Jerene Bears, MD;  Location[facility];  Service: Gastroenterology;;  . TONSILLECTOMY  age 52  . TONSILLECTOMY     as a child  . TOTAL KNEE ARTHROPLASTY Right 01/20/2016   Procedure: RIGHT TOTAL KNEE ARTHROPLASTY;  Surgeon: Paralee Cancel, MD;  Location: [facility];  Service: Orthopedics;  Laterality: Right;  . UPPER GI ENDOSCOPY  03/27/13    Family History  Problem Relation Age of Onset  . Diabetes Mother   . Hypertension Mother   . Stroke Mother   . Hyperlipidemia Mother   . Hypertension Father   . Colon polyps Father   . Heart attack Father 21  . Stroke Father   . Heart attack Brother   . Diabetes Brother   . Heart disease Brother   . Heart attack Brother        Multiple  . Diabetes Brother   . Diabetes Sister   . Stroke Sister   . Obesity Brother   . Diabetes Brother   . Heart disease Brother   . Hypertension Maternal Grandmother   . ADD / ADHD Daughter   . Heart disease Brother   . Stomach cancer Neg Hx   . Colon cancer Neg Hx   . Esophageal cancer Neg Hx   . Rectal cancer Neg Hx     Social History   Socioeconomic History  . Marital status: Married    Spouse name: Not on file  . Number of children: 2  . Years of education: Not on file  . Highest education level: Not on file  Occupational History  . Occupation: TEACHER    Employer: [employer]  Tobacco Use  . Smoking status: Former Smoker    Packs/day: 1.50    Years: 20.00    Pack years: 30.00    Types: Cigarettes    Quit date: 04/21/1991    Years since quitting: 28.3  . Smokeless tobacco: Never Used  Substance and Sexual Activity  . Alcohol use: Yes    Alcohol/week: 3.0 standard drinks    Types: 3 Cans of beer per week    Comment: social  . Drug use: No  . Sexual activity: Not on file  Other Topics Concern  . Not on file  Social History Narrative   Lives with wife in [place].   Does not routinely exercise.   Social Determinants of Health   Financial Resource Strain:   . Difficulty of Paying Living Expenses:   Food Insecurity:   . Worried About Charity fundraiser in the Last Year:   . Arboriculturist in the Last Year:   Transportation Needs:   . Film/video editor (Medical):   [name] Lack of Transportation (Non-Medical):   Physical Activity:   . Days of Exercise per Week:   .  Minutes of Exercise per Session:   Stress:   . Feeling of Stress :   Social Connections:   . Frequency of Communication with Friends and Family:   . Frequency of Social Gatherings with Friends and Family:   . Attends Religious Services:   . Active Member of Clubs or Organizations:   . Attends Archivist Meetings:   [name] Marital Status:   Intimate Partner Violence:   . Fear of Current or Ex-Partner:   . Emotionally Abused:   [name] Physically Abused:   . Sexually Abused:     Outpatient Medications Prior to Visit  Medication Sig Dispense Refill  . amitriptyline (ELAVIL) 10 MG tablet Take 1-2 tablets (10-20 mg total) by mouth at bedtime as needed for sleep. 180 tablet 1  . cetirizine (ZYRTEC) 10 MG tablet Take 10 mg by mouth daily.    . Cholecalciferol (VITAMIN D3 PO) Take 5,000 Units by mouth daily.    . Cyanocobalamin (VITAMIN B-12) 1000 MCG SUBL Place 1 each under the tongue every morning.     . cyclobenzaprine (FLEXERIL) 10 MG tablet Take 0.5-1 tablets (5-10 mg total) by mouth 3 (three) times daily as needed for muscle spasms. 60 tablet 1  . econazole nitrate 1 % cream Apply topically daily. 30 g 1  . EPINEPHRINE 0.3 mg/0.3 mL IJ SOAJ injection INJECT 0.3 MLS (0.3 MG TOTAL) INTO THE MUSCLE ONCE FOR 1 DOSE 2 each 11  . famotidine (PEPCID) 40 MG tablet TAKE 1 TABLET AT BEDTIME AS NEEDED FOR HEARTBURN OR INDIGESTION 90 tablet 3  . fenofibrate 160 MG tablet TAKE 1 TABLET DAILY 90 tablet 3  . Ferrous Gluconate-C-Folic Acid (IRON-C PO) Take by mouth. Iron 65 mg and vit C-125 mg  -take one daily    . folic acid (FOLVITE) 1 MG tablet Take 1 tablet (1 mg total) by mouth daily. 30 tablet 0  . gabapentin (NEURONTIN) 300 MG capsule 1 tab po bid and 2 tabs po qhs 360 capsule 1  . glucose blood test strip Use as directed once daily to check blood sugar.  Diagnosis code E11.9 100 each 12  . hydrochlorothiazide (MICROZIDE) 12.5 MG capsule Take 1 capsule (12.5 mg total) by mouth daily. 90 capsule 1  . HYDROcodone-acetaminophen (NORCO) 5-325 MG tablet Take 1 tablet by mouth at bedtime. 30 tablet 0  . losartan (COZAAR) 50 MG tablet Take 1 tablet (50 mg total) by mouth daily. 90 tablet 3  . Magnesium 100 MG CAPS Take 1 capsule (100 mg total) by mouth daily. 30 capsule 1  . MELATONIN ER PO Take by mouth. Melatonin 12 mg-Take 2 at bedtime.    . metFORMIN (GLUCOPHAGE) 500 MG tablet Take 1 tablet (500 mg total) by mouth 4 (four) times daily. 360 tablet 1  . methocarbamol (ROBAXIN) 750 MG tablet Take 1 tablet (750 mg total) by mouth 4 (four) times daily. 120 tablet 1  . metoprolol tartrate (LOPRESSOR) 100 MG tablet Take 1 tablet (100 mg total) by mouth 2 (two) times daily. 180 tablet 3  . mometasone (ELOCON) 0.1 % cream Apply 1 application topically daily. 90 g 1  . Multiple Vitamin (MULTIVITAMIN) tablet Take 1 tablet by mouth daily. BARIATRIC VIT    . pantoprazole (PROTONIX) 40 MG tablet TAKE 1 TABLET TWICE A DAY BEFORE MEALS 180 tablet 3  . Pitavastatin Calcium (LIVALO) 1 MG TABS Take 1 tablet (1 mg total) by mouth daily. 90 tablet 1  . sodium chloride (OCEAN) 0.65 % SOLN nasal spray  Place 1 spray into both nostrils as needed for congestion (nose irritation). 30 mL 0  . thiamine 100 MG tablet Take 1 tablet (100 mg total) by mouth daily. 30 tablet 0  . Vitamin D, Ergocalciferol, (DRISDOL) 1.25 MG (50000 UNIT) CAPS capsule TAKE 1 CAPSULE EVERY 7 DAYS 12 capsule 3  . VITAMIN E PO Take 1 tablet by mouth daily.     No facility-administered medications prior to visit.    Allergies   Allergen Reactions  . Bee Venom Anaphylaxis  . Lipitor [Atorvastatin] Other (See Comments)    Myalgias, memory changes  . Morphine Other (See Comments)    hyperactive  . Hydrocodone Itching  . Pravastatin Other (See Comments)    Made joints hurt. --also simvastatin     Review of Systems  Constitutional: Positive for malaise/fatigue. Negative for fever.  HENT: Negative for congestion.   Eyes: Negative for blurred vision.  Respiratory: Negative for shortness of breath.   Cardiovascular: Negative for chest pain, palpitations and leg swelling.  Gastrointestinal: Negative for abdominal pain, blood in stool and nausea.  Genitourinary: Negative for dysuria and frequency.  Musculoskeletal: Positive for back pain, joint pain, myalgias and neck pain. Negative for falls.  Skin: Negative for rash.  Neurological: Negative for dizziness, loss of consciousness and headaches.  Endo/Heme/Allergies: Negative for environmental allergies.  Psychiatric/Behavioral: Negative for depression. The patient has insomnia. The patient is not nervous/anxious.        Objective:    Physical Exam Constitutional:      Appearance: Normal appearance. He is not ill-appearing.  HENT:     Head: Normocephalic and atraumatic.     Right Ear: External ear normal.     Left Ear: External ear normal.     Nose: Nose normal.  Eyes:     General:        Right eye: No discharge.        Left eye: No discharge.  Pulmonary:     Effort: Pulmonary effort is normal.  Neurological:     Mental Status: He is alert and oriented to person, place, and time.  Psychiatric:        Behavior: Behavior normal.     BP 137/76   Pulse 80   Wt 260 lb (117.9 kg)   SpO2 96%   BMI 39.53 kg/m  Wt Readings from Last 3 Encounters:  08/25/19 260 lb (117.9 kg)  07/20/19 269 lb 12.8 oz (122.4 kg)  07/18/19 266 lb 12.8 oz (121 kg)    Diabetic Foot Exam - Simple   No data filed     Lab Results  Component Value Date   WBC 6.8  06/01/2019   HGB 14.2 06/01/2019   HCT 42.6 06/01/2019   PLT 210 06/01/2019   GLUCOSE 119 (H) 07/18/2019   CHOL 143 05/03/2019   TRIG 148.0 05/03/2019   HDL 55.10 05/03/2019   LDLDIRECT 85.0 11/17/2016   LDLCALC 59 05/03/2019   ALT 14 05/03/2019   AST 19 05/03/2019   NA 137 07/18/2019   K 4.9 07/18/2019   CL 98 07/18/2019   CREATININE 1.03 07/18/2019   BUN 13 07/18/2019   CO2 22 07/18/2019   TSH 1.77 07/20/2019   PSA 1.55 06/25/2017   INR 1.3 (H) 07/06/2018   HGBA1C 6.3 05/03/2019   MICROALBUR 1.1 12/02/2015    Lab Results  Component Value Date   TSH 1.77 07/20/2019   Lab Results  Component Value Date   WBC 6.8 06/01/2019   HGB 14.2  06/01/2019   HCT 42.6 06/01/2019   MCV 92.0 06/01/2019   PLT 210 06/01/2019   Lab Results  Component Value Date   NA 137 07/18/2019   K 4.9 07/18/2019   CO2 22 07/18/2019   GLUCOSE 119 (H) 07/18/2019   BUN 13 07/18/2019   CREATININE 1.03 07/18/2019   BILITOT 0.7 05/03/2019   ALKPHOS 33 (L) 05/03/2019   AST 19 05/03/2019   ALT 14 05/03/2019   PROT 6.2 05/03/2019   ALBUMIN 4.3 05/03/2019   CALCIUM 9.6 07/18/2019   ANIONGAP 8 07/14/2018   GFR 75.33 05/03/2019   Lab Results  Component Value Date   CHOL 143 05/03/2019   Lab Results  Component Value Date   HDL 55.10 05/03/2019   Lab Results  Component Value Date   LDLCALC 59 05/03/2019   Lab Results  Component Value Date   TRIG 148.0 05/03/2019   Lab Results  Component Value Date   CHOLHDL 3 05/03/2019   Lab Results  Component Value Date   HGBA1C 6.3 05/03/2019       Assessment & Plan:   Problem List Items Addressed This Visit    Essential hypertension    Monitor and report any concerns, no changes to meds. Encouraged heart healthy diet such as the DASH diet and exercise as tolerated.       Insomnia    Elavil 20 mg has not given him any concerning side effects but has not helped enough. Increase to 50 mg qhs       Back pain    He has met with his back  Doctor, Dr Tonita Cong and has been advised that he needs surgery for disc disease in his upper back. He is considering. No changes today          I am having Brent [name] "Don" start on amitriptyline. I am also having him maintain his multivitamin, Vitamin B-12, glucose blood, Magnesium, VITAMIN E PO, folic acid, thiamine, sodium chloride, Cholecalciferol (VITAMIN D3 PO), Ferrous Gluconate-C-Folic Acid (IRON-C PO), MELATONIN ER PO, econazole nitrate, pantoprazole, fenofibrate, mometasone, EPINEPHrine, famotidine, metFORMIN, gabapentin, HYDROcodone-acetaminophen, methocarbamol, cetirizine, cyclobenzaprine, Vitamin D (Ergocalciferol), metoprolol tartrate, losartan, Livalo, amitriptyline, and hydrochlorothiazide.  Meds ordered this encounter  Medications  . amitriptyline (ELAVIL) 50 MG tablet    Sig: Take 1 tablet (50 mg total) by mouth at bedtime.    Dispense:  50 tablet    Refill:  1      I discussed the assessment and treatment plan with the patient. The patient was provided an opportunity to ask questions and all were answered. The patient agreed with the plan and demonstrated an understanding of the instructions.   The patient was advised to call back or seek an in-person evaluation if the symptoms worsen or if the condition fails to improve as anticipated.  I provided 15 minutes of non-face-to-face time during this encounter.   [name], MD

## 2019-08-28 NOTE — Assessment & Plan Note (Signed)
He has met with his back Doctor, Dr Tonita Cong and has been advised that he needs surgery for disc disease in his upper back. He is considering. No changes today

## 2019-08-28 NOTE — Assessment & Plan Note (Signed)
Monitor and report any concerns, no changes to meds. Encouraged heart healthy diet such as the DASH diet and exercise as tolerated.  ?

## 2019-09-14 ENCOUNTER — Telehealth: Payer: Self-pay | Admitting: *Deleted

## 2019-09-14 NOTE — Telephone Encounter (Signed)
   [facility] Pre-operative Risk Assessment    HEARTCARE STAFF: - Please ensure there is not already an duplicate clearance open for this procedure. - Under Visit Info/Reason for Call, type in Other and utilize the format Clearance MM/DD/YY or Clearance TBD. Do not use dashes or single digits. - If request is for dental extraction, please clarify the # of teeth to be extracted.  Request for surgical clearance:  1. What type of surgery is being performed? RIGHT SHOULDER RCR   2. When is this surgery scheduled? TBD   3. What type of clearance is required (medical clearance vs. Pharmacy clearance to hold med vs. Both)? MEDICAL  4. Are there any medications that need to be held prior to surgery and how long? NONE LISTED   5. Practice name and name of physician performing surgery? [facility]; DR. Mitzi Hansen [place]   6. What is the office phone number? [phone]   7.   What is the office fax number?  [phone]  8.   Anesthesia type (None, local, MAC, general) ? NOT LISTED; I TRIED TO REACH SURGEON'S OFFICE THOUGH WAS ON HOLD FOR OVER 9 MINUTES WITH NO ANSWERING THE LINE   Julaine Hua 09/14/2019, 2:13 PM  _________________________________________________________________   (provider comments below)

## 2019-09-14 NOTE — Telephone Encounter (Signed)
   Primary Cardiologist: Candee Furbish, MD  Chart reviewed as part of pre-operative protocol coverage. Given past medical history and time since last visit, based on ACC/AHA guidelines, BRENNER SANTOLI would be at acceptable risk for the planned procedure without further cardiovascular testing.   I will route this recommendation to the requesting party via Epic fax function and remove from pre-op pool.  Please call with questions.  Jossie Ng. Cordney Barstow NP-C    09/14/2019, 2:43 PM [address] Office [phone] Fax [phone]

## 2019-09-19 ENCOUNTER — Other Ambulatory Visit: Payer: Self-pay | Admitting: Family Medicine

## 2019-09-20 ENCOUNTER — Encounter: Payer: Self-pay | Admitting: Family Medicine

## 2019-09-26 ENCOUNTER — Other Ambulatory Visit: Payer: Self-pay

## 2019-09-26 ENCOUNTER — Telehealth: Payer: Self-pay | Admitting: Family

## 2019-09-26 ENCOUNTER — Encounter: Payer: Self-pay | Admitting: Family

## 2019-09-26 ENCOUNTER — Inpatient Hospital Stay (HOSPITAL_BASED_OUTPATIENT_CLINIC_OR_DEPARTMENT_OTHER): Admitting: Family

## 2019-09-26 ENCOUNTER — Inpatient Hospital Stay: Attending: Hematology & Oncology

## 2019-09-26 VITALS — BP 131/74 | HR 85 | Temp 97.9°F | Resp 18 | Ht 68.0 in | Wt 270.0 lb

## 2019-09-26 DIAGNOSIS — R200 Anesthesia of skin: Secondary | ICD-10-CM | POA: Diagnosis not present

## 2019-09-26 DIAGNOSIS — K922 Gastrointestinal hemorrhage, unspecified: Secondary | ICD-10-CM | POA: Insufficient documentation

## 2019-09-26 DIAGNOSIS — R202 Paresthesia of skin: Secondary | ICD-10-CM | POA: Diagnosis not present

## 2019-09-26 DIAGNOSIS — D500 Iron deficiency anemia secondary to blood loss (chronic): Secondary | ICD-10-CM | POA: Insufficient documentation

## 2019-09-26 DIAGNOSIS — K909 Intestinal malabsorption, unspecified: Secondary | ICD-10-CM | POA: Insufficient documentation

## 2019-09-26 LAB — CBC WITH DIFFERENTIAL (CANCER CENTER ONLY)
Abs Immature Granulocytes: 0.04 10*3/uL (ref 0.00–0.07)
Basophils Absolute: 0 10*3/uL (ref 0.0–0.1)
Basophils Relative: 1 %
Eosinophils Absolute: 0.2 10*3/uL (ref 0.0–0.5)
Eosinophils Relative: 5 %
HCT: 37.7 % — ABNORMAL LOW (ref 39.0–52.0)
Hemoglobin: 12.4 g/dL — ABNORMAL LOW (ref 13.0–17.0)
Immature Granulocytes: 1 %
Lymphocytes Relative: 28 %
Lymphs Abs: 1.4 10*3/uL (ref 0.7–4.0)
MCH: 30.8 pg (ref 26.0–34.0)
MCHC: 32.9 g/dL (ref 30.0–36.0)
MCV: 93.8 fL (ref 80.0–100.0)
Monocytes Absolute: 0.7 10*3/uL (ref 0.1–1.0)
Monocytes Relative: 13 %
Neutro Abs: 2.7 10*3/uL (ref 1.7–7.7)
Neutrophils Relative %: 52 %
Platelet Count: 217 10*3/uL (ref 150–400)
RBC: 4.02 MIL/uL — ABNORMAL LOW (ref 4.22–5.81)
RDW: 12.1 % (ref 11.5–15.5)
WBC Count: 5.2 10*3/uL (ref 4.0–10.5)
nRBC: 0 % (ref 0.0–0.2)

## 2019-09-26 LAB — RETICULOCYTES
Immature Retic Fract: 18.2 % — ABNORMAL HIGH (ref 2.3–15.9)
RBC.: 4 MIL/uL — ABNORMAL LOW (ref 4.22–5.81)
Retic Count, Absolute: 106.8 10*3/uL (ref 19.0–186.0)
Retic Ct Pct: 2.7 % (ref 0.4–3.1)

## 2019-09-26 LAB — IRON AND TIBC
Iron: 85 ug/dL (ref 42–163)
Saturation Ratios: 24 % (ref 20–55)
TIBC: 359 ug/dL (ref 202–409)
UIBC: 274 ug/dL (ref 117–376)

## 2019-09-26 LAB — FERRITIN: Ferritin: 183 ng/mL (ref 24–336)

## 2019-09-26 NOTE — Telephone Encounter (Signed)
Appointments scheduled calendar printed per 6/8 los 

## 2019-09-26 NOTE — Progress Notes (Signed)
Hematology and Oncology Follow Up Visit  Brent [name] [id number] January 23, 1956 64 y.o. 09/26/2019   Principle Diagnosis:  Iron deficiency anemia secondary tomalabsorptionand history ofacute GI bleed  Current Therapy:        IV iron as indicated    Interim History:  Brent [name] is here today for follow-up. He is doing well and has no complaints at this time.  He has not noted any episodes of blood loss. No bruising or petechiae.  No fever, chills, n/v, cough, rash, dizziness, SOB, chest pain, palpitations, abdominal pain or changes in bowel or bladder habits.  He has some numbness and tingling in his right hands that comes and goes due to spinal stenosis. No swelling, tenderness, numbness or tingling in his extremities at this time.  No falls or syncopal episodes to report.  He has maintained a good appetite and is staying well hydrated. His weight is stable.   ECOG Performance Status: 1 - Symptomatic but completely ambulatory  Medications:  Allergies as of 09/26/2019      Reactions   Bee Venom Anaphylaxis   Lipitor [atorvastatin] Other (See Comments)   Myalgias, memory changes   Morphine Other (See Comments)   hyperactive   Hydrocodone Itching   Pravastatin Other (See Comments)   Made joints hurt. --also simvastatin       Medication List       Accurate as of September 26, 2019  8:47 AM. If you have any questions, ask your nurse or doctor.        amitriptyline 10 MG tablet Commonly known as: ELAVIL Take 1-2 tablets (10-20 mg total) by mouth at bedtime as needed for sleep.   amitriptyline 50 MG tablet Commonly known as: ELAVIL Take 1 tablet (50 mg total) by mouth at bedtime.   cetirizine 10 MG tablet Commonly known as: ZYRTEC Take 10 mg by mouth daily.   cyclobenzaprine 10 MG tablet Commonly known as: FLEXERIL Take 0.5-1 tablets (5-10 mg total) by mouth 3 (three) times daily as needed for muscle spasms.   econazole nitrate 1 % cream Apply topically daily.    EPINEPHrine 0.3 mg/0.3 mL Soaj injection Commonly known as: EPI-PEN INJECT 0.3 MLS (0.3 MG TOTAL) INTO THE MUSCLE ONCE FOR 1 DOSE   famotidine 40 MG tablet Commonly known as: PEPCID TAKE 1 TABLET AT BEDTIME AS NEEDED FOR HEARTBURN OR INDIGESTION   fenofibrate 160 MG tablet TAKE 1 TABLET DAILY   folic acid 1 MG tablet Commonly known as: FOLVITE Take 1 tablet (1 mg total) by mouth daily.   gabapentin 300 MG capsule Commonly known as: NEURONTIN 1 tab po bid and 2 tabs po qhs   glucose blood test strip Use as directed once daily to check blood sugar.  Diagnosis code E11.9   hydrochlorothiazide 12.5 MG capsule Commonly known as: MICROZIDE Take 1 capsule (12.5 mg total) by mouth daily.   HYDROcodone-acetaminophen 5-325 MG tablet Commonly known as: Norco Take 1 tablet by mouth at bedtime.   IRON-C PO Take by mouth. Iron 65 mg and vit C-125 mg -take one daily   Livalo 1 MG Tabs Generic drug: Pitavastatin Calcium Take 1 tablet (1 mg total) by mouth daily.   losartan 50 MG tablet Commonly known as: COZAAR Take 1 tablet (50 mg total) by mouth daily.   Magnesium 100 MG Caps Take 1 capsule (100 mg total) by mouth daily.   MELATONIN ER PO Take by mouth. Melatonin 12 mg-Take 2 at bedtime.   metFORMIN 500 MG tablet  Commonly known as: GLUCOPHAGE Take 1 tablet (500 mg total) by mouth 4 (four) times daily.   methocarbamol 750 MG tablet Commonly known as: ROBAXIN Take 1 tablet (750 mg total) by mouth 4 (four) times daily.   metoprolol tartrate 100 MG tablet Commonly known as: LOPRESSOR Take 1 tablet (100 mg total) by mouth 2 (two) times daily.   mometasone 0.1 % cream Commonly known as: Elocon Apply 1 application topically daily.   multivitamin tablet Take 1 tablet by mouth daily. BARIATRIC VIT   pantoprazole 40 MG tablet Commonly known as: PROTONIX TAKE 1 TABLET TWICE A DAY BEFORE MEALS   sodium chloride 0.65 % Soln nasal spray Commonly known as: OCEAN Place 1  spray into both nostrils as needed for congestion (nose irritation).   thiamine 100 MG tablet Take 1 tablet (100 mg total) by mouth daily.   Vitamin B-12 1000 MCG Subl Place 1 each under the tongue every morning.   Vitamin D (Ergocalciferol) 1.25 MG (50000 UNIT) Caps capsule Commonly known as: DRISDOL TAKE 1 CAPSULE EVERY 7 DAYS   VITAMIN D3 PO Take 5,000 Units by mouth daily.   VITAMIN E PO Take 1 tablet by mouth daily.       Allergies:  Allergies  Allergen Reactions  . Bee Venom Anaphylaxis  . Lipitor [Atorvastatin] Other (See Comments)    Myalgias, memory changes  . Morphine Other (See Comments)    hyperactive  . Hydrocodone Itching  . Pravastatin Other (See Comments)    Made joints hurt. --also simvastatin     Past Medical History, Surgical history, Social history, and Family History were reviewed and updated.  Review of Systems: All other 10 point review of systems is negative.   Physical Exam:  vitals were not taken for this visit.   Wt Readings from Last 3 Encounters:  08/25/19 260 lb (117.9 kg)  07/20/19 269 lb 12.8 oz (122.4 kg)  07/18/19 266 lb 12.8 oz (121 kg)    Ocular: Sclerae unicteric, pupils equal, round and reactive to light Ear-nose-throat: Oropharynx clear, dentition fair Lymphatic: No cervical or supraclavicular adenopathy Lungs no rales or rhonchi, good excursion bilaterally Heart regular rate and rhythm, no murmur appreciated Abd soft, nontender, positive bowel sounds, no liver or spleen tip palpated on exam, no fluid wave  MSK no focal spinal tenderness, no joint edema Neuro: non-focal, well-oriented, appropriate affect Breasts: Deferred   Lab Results  Component Value Date   WBC 5.2 09/26/2019   HGB 12.4 (L) 09/26/2019   HCT 37.7 (L) 09/26/2019   MCV 93.8 09/26/2019   PLT 217 09/26/2019   Lab Results  Component Value Date   FERRITIN 136 06/01/2019   IRON 137 06/01/2019   TIBC 399 06/01/2019   UIBC 262 06/01/2019    IRONPCTSAT 34 06/01/2019   Lab Results  Component Value Date   RETICCTPCT 2.7 09/26/2019   RBC 4.02 (L) 09/26/2019   RBC 4.00 (L) 09/26/2019   No results found for: KPAFRELGTCHN, LAMBDASER, KAPLAMBRATIO No results found for: IGGSERUM, IGA, IGMSERUM No results found for: Odetta Pink, SPEI   Chemistry      Component Value Date/Time   NA 137 07/18/2019 1454   K 4.9 07/18/2019 1454   CL 98 07/18/2019 1454   CO2 22 07/18/2019 1454   BUN 13 07/18/2019 1454   CREATININE 1.03 07/18/2019 1454   CREATININE 0.95 07/14/2018 1021   CREATININE 0.97 11/21/2013 0948      Component Value Date/Time  CALCIUM 9.6 07/18/2019 1454   ALKPHOS 33 (L) 05/03/2019 0755   AST 19 05/03/2019 0755   AST 15 07/14/2018 1021   ALT 14 05/03/2019 0755   ALT 11 07/14/2018 1021   BILITOT 0.7 05/03/2019 0755   BILITOT 0.4 07/14/2018 1021       Impression and Plan: Mr. Allegretto is a very pleasant 64yo caucasian gentleman with iron deficiency anemia secondary to malabsorption (gastric bypass) as well intermittent GI blood loss with ulcer. We will see what his iron studies look like and replace if needed.  We will see him back in another 4 months.  He will contact our office with any questions or concerns. We can certainly see him sooner if needed.   Laverna Peace, NP 6/8/20218:47 AM

## 2019-09-29 ENCOUNTER — Ambulatory Visit: Admitting: Family

## 2019-09-29 ENCOUNTER — Other Ambulatory Visit

## 2019-10-26 ENCOUNTER — Encounter: Payer: Self-pay | Admitting: Family Medicine

## 2019-10-27 ENCOUNTER — Other Ambulatory Visit: Payer: Self-pay

## 2019-10-27 ENCOUNTER — Encounter: Payer: Self-pay | Admitting: Family Medicine

## 2019-10-27 ENCOUNTER — Telehealth (INDEPENDENT_AMBULATORY_CARE_PROVIDER_SITE_OTHER): Admitting: Family Medicine

## 2019-10-27 DIAGNOSIS — I10 Essential (primary) hypertension: Secondary | ICD-10-CM

## 2019-10-27 DIAGNOSIS — I2510 Atherosclerotic heart disease of native coronary artery without angina pectoris: Secondary | ICD-10-CM | POA: Diagnosis not present

## 2019-10-27 DIAGNOSIS — M545 Low back pain, unspecified: Secondary | ICD-10-CM

## 2019-10-27 DIAGNOSIS — M25519 Pain in unspecified shoulder: Secondary | ICD-10-CM

## 2019-10-27 DIAGNOSIS — E1100 Type 2 diabetes mellitus with hyperosmolarity without nonketotic hyperglycemic-hyperosmolar coma (NKHHC): Secondary | ICD-10-CM

## 2019-10-27 MED ORDER — CYCLOBENZAPRINE HCL 10 MG PO TABS: 5.0000 mg | ORAL_TABLET | Freq: Two times a day (BID) | ORAL | 1 refills | Status: DC | PRN

## 2019-10-27 NOTE — Assessment & Plan Note (Signed)
Encouraged DASH diet, decrease po intake and increase exercise as tolerated. Needs 7-8 hours of sleep nightly. Avoid trans fats, eat small, frequent meals every 4-5 hours with lean proteins, complex carbs and healthy fats. Minimize simple carbs 

## 2019-10-27 NOTE — Assessment & Plan Note (Signed)
hgba1c acceptable, minimize simple carbs. Increase exercise as tolerated. Continue current meds 

## 2019-10-27 NOTE — Progress Notes (Signed)
Virtual Visit via Video Note  I connected with Brent [name] on 10/27/19 at  8:00 AM EDT by a video enabled telemedicine application and verified that I am speaking with the correct person using two identifiers.  Location: Patient: home, patient in the visit Provider: home, provider in the visit   I discussed the limitations of evaluation and management by telemedicine and the availability of in person appointments. The patient expressed understanding and agreed to proceed. Kem Boroughs, CMA was able to get the patient set up on a video visit    Subjective:    Patient ID: Brent [name], male    DOB: [birth year], 64 y.o.   MRN: [id number]  Chief Complaint  Patient presents with  . Follow-up  . surgical clearance form    HPI Patient is in today for follow up on chronic medical concerns. He is planning to have his left shoulder injuries repaired on July 27. He is anxious to proceed due to his debility secondary to pain. No recent fall or trauma. He is struggling with increased mid back pain that he feels dates back to an injury in May 2013 when he slipped in the rain and hurt his back. It has been getting worse and he has had a shot recently from Dr Gregor Hams and they are considering surgery after his shoulder surgery. No polyuria or polydipsia. Denies CP/palp/SOB/HA/congestion/fevers/GI or GU c/o. Taking meds as prescribed  Past Medical History:  Diagnosis Date  . Acid reflux disease   . ACID REFLUX DISEASE 07/05/2007  . Acute gastric ulcer with bleeding   . Alcohol abuse   . Anemia   . Atherosclerosis   . Benign neoplasm of colon 07/05/2007  . Bilateral hip pain 10/05/2016  . Breast pain, left 11/17/2011  . CAD (coronary artery disease)   . Chest pain    a. Reportedly negative dobut echo performed prior to gastric bypass in 03/2011;  b. 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 12/2011 Cath: LM nl, LAD 50p, D1 56m, LCX min irregs, OM3 30, RCA 25p, 64m (FFR 0.99->0.89), PDA 30, EF  65%, Med Rx.  . COLONIC POLYPS, HX OF 10/29/2008  . Diarrhea 06/13/2010  . Diverticulosis 07/05/2018  . DIVERTICULOSIS, COLON 10/29/2008  . DM (diabetes mellitus), type 2, uncontrolled ([place])   . GI bleed   . Gout 03/04/2013  . Hearing loss 05/24/2013   Previous audiology evaluation completely.  [name] HTN (hypertension) 07/14/2010  . Hx of colonic polyps   . HYPERSOMNIA, ASSOCIATED WITH SLEEP APNEA 07/26/2008  . Hypertension 07/14/2010  . 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 Internal hemorrhoids   . Knee pain, left 10/10/2010  . Morbid obesity ([place]) 03/27/2010   a. 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  [name] Neck pain 03/2015  . Other and unspecified hyperlipidemia 11/16/2012  . Post-operative nausea and vomiting    after the surgery in hospital in March 2020/ past the cauderization  . Preventative health care 11/17/2011  . Renal stone 11/2013  . Sleep apnea    a. CPAP  . Spinal stenosis   . Tear of meniscus of left knee 2012    Past Surgical History:  Procedure Laterality Date  . ANKLE SURGERY Right 1994  . BICEPS TENDON REPAIR     left side  . CARDIAC CATHETERIZATION     denies any chest pain in the past 2 years  . COLONOSCOPY    . colonoscopy polyps    . ESOPHAGOGASTRODUODENOSCOPY N/A 03/27/2013   Procedure: ESOPHAGOGASTRODUODENOSCOPY (EGD);  Surgeon: Irene Shipper, MD;  Location: [facility];  Service: Endoscopy;  Laterality: N/A;  . ESOPHAGOGASTRODUODENOSCOPY (EGD) WITH PROPOFOL N/A 07/06/2018   Procedure: ESOPHAGOGASTRODUODENOSCOPY (EGD) WITH PROPOFOL;  Surgeon: Jerene Bears, MD;  Location: [facility];  Service: Gastroenterology;  Laterality: N/A;  . GASTRIC BYPASS    . HERNIA REPAIR    . HOT HEMOSTASIS N/A 07/06/2018   Procedure: HOT HEMOSTASIS (ARGON PLASMA COAGULATION/BICAP);  Surgeon: Jerene Bears, MD;  Location: [facility];  Service: Gastroenterology;  Laterality: N/A;  . KNEE ARTHROSCOPY Left 11/06/2010   Left, torn meniscus (repaired)  . LEFT HEART CATHETERIZATION WITH CORONARY  ANGIOGRAM N/A 12/23/2011   Procedure: LEFT HEART CATHETERIZATION WITH CORONARY ANGIOGRAM;  Surgeon: Minus Breeding, MD;  Location: [facility] CATH LAB;  Service: Cardiovascular;  Laterality: N/A;  . REPLACEMENT TOTAL KNEE Right 01/2016   removed scar tissue/ cut tip of nerve bundle and re-route  . right knee arthroscopy Right 07/05/14   Dr. Hart Robinsons, [place].  [name] ROTATOR CUFF REPAIR  2019   left  . SCHLEROTHERAPY  07/06/2018   Procedure: Woodward Ku;  Surgeon: Jerene Bears, MD;  Location: [facility];  Service: Gastroenterology;;  . TONSILLECTOMY  age 63  . TONSILLECTOMY     as a child  . TOTAL KNEE ARTHROPLASTY Right 01/20/2016   Procedure: RIGHT TOTAL KNEE ARTHROPLASTY;  Surgeon: Paralee Cancel, MD;  Location: [facility];  Service: Orthopedics;  Laterality: Right;  . UPPER GI ENDOSCOPY  03/27/13    Family History  Problem Relation Age of Onset  . Diabetes Mother   . Hypertension Mother   . Stroke Mother   . Hyperlipidemia Mother   . Hypertension Father   . Colon polyps Father   . Heart attack Father 9  . Stroke Father   . Heart attack Brother   . Diabetes Brother   . Heart disease Brother   . Heart attack Brother        Multiple  . Diabetes Brother   . Diabetes Sister   . Stroke Sister   . Obesity Brother   . Diabetes Brother   . Heart disease Brother   . Hypertension Maternal Grandmother   . ADD / ADHD Daughter   . Heart disease Brother   . Stomach cancer Neg Hx   . Colon cancer Neg Hx   . Esophageal cancer Neg Hx   . Rectal cancer Neg Hx     Social History   Socioeconomic History  . Marital status: Married    Spouse name: Not on file  . Number of children: 2  . Years of education: Not on file  . Highest education level: Not on file  Occupational History  . Occupation: TEACHER    Employer: [employer]  Tobacco Use  . Smoking status: Former Smoker    Packs/day: 1.50    Years: 20.00    Pack years: 30.00    Types: Cigarettes    Quit date: 04/21/1991     Years since quitting: 28.5  . Smokeless tobacco: Never Used  Vaping Use  . Vaping Use: Never used  Substance and Sexual Activity  . Alcohol use: Yes    Alcohol/week: 3.0 standard drinks    Types: 3 Cans of beer per week    Comment: social  . Drug use: No  . Sexual activity: Not on file  Other Topics Concern  . Not on file  Social History Narrative   Lives with wife in [place].  Does not routinely exercise.  Social Determinants of Health   Financial Resource Strain:   . Difficulty of Paying Living Expenses:   Food Insecurity:   . Worried About Charity fundraiser in the Last Year:   . Arboriculturist in the Last Year:   Transportation Needs:   . Film/video editor (Medical):   [name] Lack of Transportation (Non-Medical):   Physical Activity:   . Days of Exercise per Week:   . Minutes of Exercise per Session:   Stress:   . Feeling of Stress :   Social Connections:   . Frequency of Communication with Friends and Family:   . Frequency of Social Gatherings with Friends and Family:   . Attends Religious Services:   . Active Member of Clubs or Organizations:   . Attends Archivist Meetings:   [name] Marital Status:   Intimate Partner Violence:   . Fear of Current or Ex-Partner:   . Emotionally Abused:   [name] Physically Abused:   . Sexually Abused:     Outpatient Medications Prior to Visit  Medication Sig Dispense Refill  . amitriptyline (ELAVIL) 50 MG tablet Take 1 tablet (50 mg total) by mouth at bedtime. 50 tablet 1  . cetirizine (ZYRTEC) 10 MG tablet Take 10 mg by mouth daily.    . Cholecalciferol (VITAMIN D3 PO) Take 5,000 Units by mouth daily.    . Cyanocobalamin (VITAMIN B-12) 1000 MCG SUBL Place 1 each under the tongue every morning.     [name] econazole nitrate 1 % cream Apply topically daily. 30 g 1  . EPINEPHRINE 0.3 mg/0.3 mL IJ SOAJ injection INJECT 0.3 MLS (0.3 MG TOTAL) INTO THE MUSCLE ONCE FOR 1 DOSE 2 each 11  . famotidine (PEPCID) 40 MG tablet TAKE 1  TABLET AT BEDTIME AS NEEDED FOR HEARTBURN OR INDIGESTION 90 tablet 3  . fenofibrate 160 MG tablet TAKE 1 TABLET DAILY 90 tablet 3  . Ferrous Gluconate-C-Folic Acid (IRON-C PO) Take by mouth. Iron 65 mg and vit C-125 mg -take one daily    . folic acid (FOLVITE) 1 MG tablet Take 1 tablet (1 mg total) by mouth daily. 30 tablet 0  . gabapentin (NEURONTIN) 300 MG capsule 1 tab po bid and 2 tabs po qhs 360 capsule 1  . glucose blood test strip Use as directed once daily to check blood sugar.  Diagnosis code E11.9 100 each 12  . hydrochlorothiazide (MICROZIDE) 12.5 MG capsule Take 1 capsule (12.5 mg total) by mouth daily. 90 capsule 1  . HYDROcodone-acetaminophen (NORCO) 5-325 MG tablet Take 1 tablet by mouth at bedtime. 30 tablet 0  . losartan (COZAAR) 50 MG tablet Take 1 tablet (50 mg total) by mouth daily. 90 tablet 3  . Magnesium 100 MG CAPS Take 1 capsule (100 mg total) by mouth daily. 30 capsule 1  . MELATONIN ER PO Take by mouth. Melatonin 12 mg-Take 2 at bedtime.    . metFORMIN (GLUCOPHAGE) 500 MG tablet Take 1 tablet (500 mg total) by mouth 4 (four) times daily. 360 tablet 1  . methocarbamol (ROBAXIN) 750 MG tablet Take 1 tablet (750 mg total) by mouth 4 (four) times daily. 120 tablet 1  . metoprolol tartrate (LOPRESSOR) 100 MG tablet Take 1 tablet (100 mg total) by mouth 2 (two) times daily. 180 tablet 3  . mometasone (ELOCON) 0.1 % cream Apply 1 application topically daily. 90 g 1  . Multiple Vitamin (MULTIVITAMIN) tablet Take 1 tablet by mouth daily. BARIATRIC VIT    .  pantoprazole (PROTONIX) 40 MG tablet TAKE 1 TABLET TWICE A DAY BEFORE MEALS 180 tablet 3  . Pitavastatin Calcium (LIVALO) 1 MG TABS Take 1 tablet (1 mg total) by mouth daily. 90 tablet 1  . sodium chloride (OCEAN) 0.65 % SOLN nasal spray Place 1 spray into both nostrils as needed for congestion (nose irritation). 30 mL 0  . thiamine 100 MG tablet Take 1 tablet (100 mg total) by mouth daily. 30 tablet 0  . Vitamin D,  Ergocalciferol, (DRISDOL) 1.25 MG (50000 UNIT) CAPS capsule TAKE 1 CAPSULE EVERY 7 DAYS 12 capsule 3  . VITAMIN E PO Take 1 tablet by mouth daily.    . cyclobenzaprine (FLEXERIL) 10 MG tablet Take 0.5-1 tablets (5-10 mg total) by mouth 3 (three) times daily as needed for muscle spasms. 60 tablet 1  . amitriptyline (ELAVIL) 10 MG tablet Take 1-2 tablets (10-20 mg total) by mouth at bedtime as needed for sleep. 180 tablet 1   No facility-administered medications prior to visit.    Allergies  Allergen Reactions  . Bee Venom Anaphylaxis  . Lipitor [Atorvastatin] Other (See Comments)    Myalgias, memory changes  . Morphine Other (See Comments)    hyperactive  . Hydrocodone Itching  . Pravastatin Other (See Comments)    Made joints hurt. --also simvastatin     Review of Systems  Constitutional: Negative for fever and malaise/fatigue.  HENT: Negative for congestion.   Eyes: Negative for blurred vision.  Respiratory: Negative for shortness of breath.   Cardiovascular: Negative for chest pain, palpitations and leg swelling.  Gastrointestinal: Negative for abdominal pain, blood in stool and nausea.  Genitourinary: Negative for dysuria and frequency.  Musculoskeletal: Positive for back pain and joint pain. Negative for falls.  Skin: Negative for rash.  Neurological: Negative for dizziness, loss of consciousness and headaches.  Endo/Heme/Allergies: Negative for environmental allergies.  Psychiatric/Behavioral: Negative for depression. The patient is not nervous/anxious.        Objective:    Physical Exam Constitutional:      Appearance: Normal appearance. He is not ill-appearing.  HENT:     Head: Normocephalic and atraumatic.     Right Ear: External ear normal.     Left Ear: External ear normal.     Nose: Nose normal.  Eyes:     General:        Right eye: No discharge.        Left eye: No discharge.  Pulmonary:     Effort: Pulmonary effort is normal.  Neurological:     Mental  Status: He is alert and oriented to person, place, and time.  Psychiatric:        Behavior: Behavior normal.     BP (!) 117/51   Pulse 71   SpO2 97%  Wt Readings from Last 3 Encounters:  09/26/19 270 lb (122.5 kg)  08/25/19 260 lb (117.9 kg)  07/20/19 269 lb 12.8 oz (122.4 kg)    Diabetic Foot Exam - Simple   No data filed     Lab Results  Component Value Date   WBC 5.2 09/26/2019   HGB 12.4 (L) 09/26/2019   HCT 37.7 (L) 09/26/2019   PLT 217 09/26/2019   GLUCOSE 119 (H) 07/18/2019   CHOL 143 05/03/2019   TRIG 148.0 05/03/2019   HDL 55.10 05/03/2019   LDLDIRECT 85.0 11/17/2016   LDLCALC 59 05/03/2019   ALT 14 05/03/2019   AST 19 05/03/2019   NA 137 07/18/2019   K 4.9  07/18/2019   CL 98 07/18/2019   CREATININE 1.03 07/18/2019   BUN 13 07/18/2019   CO2 22 07/18/2019   TSH 1.77 07/20/2019   PSA 1.55 06/25/2017   INR 1.3 (H) 07/06/2018   HGBA1C 6.3 05/03/2019   MICROALBUR 1.1 12/02/2015    Lab Results  Component Value Date   TSH 1.77 07/20/2019   Lab Results  Component Value Date   WBC 5.2 09/26/2019   HGB 12.4 (L) 09/26/2019   HCT 37.7 (L) 09/26/2019   MCV 93.8 09/26/2019   PLT 217 09/26/2019   Lab Results  Component Value Date   NA 137 07/18/2019   K 4.9 07/18/2019   CO2 22 07/18/2019   GLUCOSE 119 (H) 07/18/2019   BUN 13 07/18/2019   CREATININE 1.03 07/18/2019   BILITOT 0.7 05/03/2019   ALKPHOS 33 (L) 05/03/2019   AST 19 05/03/2019   ALT 14 05/03/2019   PROT 6.2 05/03/2019   ALBUMIN 4.3 05/03/2019   CALCIUM 9.6 07/18/2019   ANIONGAP 8 07/14/2018   GFR 75.33 05/03/2019   Lab Results  Component Value Date   CHOL 143 05/03/2019   Lab Results  Component Value Date   HDL 55.10 05/03/2019   Lab Results  Component Value Date   LDLCALC 59 05/03/2019   Lab Results  Component Value Date   TRIG 148.0 05/03/2019   Lab Results  Component Value Date   CHOLHDL 3 05/03/2019   Lab Results  Component Value Date   HGBA1C 6.3 05/03/2019        Assessment & Plan:   Problem List Items Addressed This Visit    Morbid obesity ([place])    Encouraged DASH diet, decrease po intake and increase exercise as tolerated. Needs 7-8 hours of sleep nightly. Avoid trans fats, eat small, frequent meals every 4-5 hours with lean proteins, complex carbs and healthy fats. Minimize simple carbs      T2DM (type 2 diabetes mellitus) ([facility])    hgba1c acceptable, minimize simple carbs. Increase exercise as tolerated. Continue current meds      Essential hypertension    Monitor and report any concerns, no changes to meds. Encouraged heart healthy diet such as the DASH diet and exercise as tolerated.       CAD (coronary artery disease)    Patient following with cardiology and doing well      Shoulder pain    Left shoulder injury and pain. He is scheduled for surgical repair on November 14, 2019 and is anxious to proceed. He has been cleared by cardiology and is clinically stable, unless his clinical condition changes he is medically cleared to proceed with surgery.      Low back pain    Has been having increased back pain and has had some recent shots at T12/L1 with Dr Nelva Bush. No changes for now. They may consider back surgery after his shoulder surgery is completed. Given refill on Cyclobenzaprine to use prn      Relevant Medications   cyclobenzaprine (FLEXERIL) 10 MG tablet      I am having Brent [name] "Don" maintain his multivitamin, Vitamin B-12, glucose blood, Magnesium, VITAMIN E PO, folic acid, thiamine, sodium chloride, Cholecalciferol (VITAMIN D3 PO), Ferrous Gluconate-C-Folic Acid (IRON-C PO), MELATONIN ER PO, econazole nitrate, fenofibrate, mometasone, EPINEPHrine, famotidine, metFORMIN, gabapentin, HYDROcodone-acetaminophen, methocarbamol, cetirizine, Vitamin D (Ergocalciferol), metoprolol tartrate, losartan, Livalo, hydrochlorothiazide, amitriptyline, pantoprazole, and cyclobenzaprine.  Meds ordered this encounter  Medications   . DISCONTD: cyclobenzaprine (FLEXERIL) 10 MG tablet  Sig: Take 0.5-1 tablets (5-10 mg total) by mouth 2 (two) times daily as needed for muscle spasms.    Dispense:  60 tablet    Refill:  1  . cyclobenzaprine (FLEXERIL) 10 MG tablet    Sig: Take 0.5-1 tablets (5-10 mg total) by mouth 2 (two) times daily as needed for muscle spasms.    Dispense:  180 tablet    Refill:  1   I discussed the assessment and treatment plan with the patient. The patient was provided an opportunity to ask questions and all were answered. The patient agreed with the plan and demonstrated an understanding of the instructions.   The patient was advised to call back or seek an in-person evaluation if the symptoms worsen or if the condition fails to improve as anticipated.  I provided 20 minutes of non-face-to-face time during this encounter.    [name], MD

## 2019-10-27 NOTE — Assessment & Plan Note (Signed)
Monitor and report any concerns, no changes to meds. Encouraged heart healthy diet such as the DASH diet and exercise as tolerated.  ?

## 2019-10-27 NOTE — Assessment & Plan Note (Addendum)
Has been having increased back pain and has had some recent shots at T12/L1 with Dr Nelva Bush. No changes for now. They may consider back surgery after his shoulder surgery is completed. Given refill on Cyclobenzaprine to use prn

## 2019-10-27 NOTE — Assessment & Plan Note (Addendum)
Left shoulder injury and pain. He is scheduled for surgical repair on November 14, 2019 and is anxious to proceed. He has been cleared by cardiology and is clinically stable, unless his clinical condition changes he is medically cleared to proceed with surgery.

## 2019-10-27 NOTE — Assessment & Plan Note (Signed)
Patient following with cardiology and doing well

## 2019-11-01 ENCOUNTER — Telehealth: Payer: Self-pay | Admitting: *Deleted

## 2019-11-01 NOTE — Telephone Encounter (Signed)
Surgical clearance for right shoulder faxed to [facility] at [facility].

## 2019-12-02 ENCOUNTER — Other Ambulatory Visit: Payer: Self-pay | Admitting: Family Medicine

## 2019-12-27 ENCOUNTER — Encounter: Payer: Self-pay | Admitting: Family

## 2019-12-27 ENCOUNTER — Inpatient Hospital Stay: Payer: PRIVATE HEALTH INSURANCE | Attending: Hematology & Oncology

## 2019-12-27 ENCOUNTER — Other Ambulatory Visit: Payer: Self-pay

## 2019-12-27 ENCOUNTER — Telehealth: Payer: Self-pay | Admitting: Family

## 2019-12-27 ENCOUNTER — Inpatient Hospital Stay (HOSPITAL_BASED_OUTPATIENT_CLINIC_OR_DEPARTMENT_OTHER): Payer: PRIVATE HEALTH INSURANCE | Admitting: Family

## 2019-12-27 VITALS — BP 128/61 | HR 73 | Temp 98.4°F | Resp 18 | Ht 68.0 in | Wt 262.8 lb

## 2019-12-27 DIAGNOSIS — R202 Paresthesia of skin: Secondary | ICD-10-CM | POA: Diagnosis not present

## 2019-12-27 DIAGNOSIS — Z9884 Bariatric surgery status: Secondary | ICD-10-CM | POA: Insufficient documentation

## 2019-12-27 DIAGNOSIS — K909 Intestinal malabsorption, unspecified: Secondary | ICD-10-CM | POA: Diagnosis not present

## 2019-12-27 DIAGNOSIS — D500 Iron deficiency anemia secondary to blood loss (chronic): Secondary | ICD-10-CM | POA: Diagnosis not present

## 2019-12-27 DIAGNOSIS — Z8711 Personal history of peptic ulcer disease: Secondary | ICD-10-CM | POA: Insufficient documentation

## 2019-12-27 LAB — RETICULOCYTES
Immature Retic Fract: 11.6 % (ref 2.3–15.9)
RBC.: 3.71 MIL/uL — ABNORMAL LOW (ref 4.22–5.81)
Retic Count, Absolute: 93.1 10*3/uL (ref 19.0–186.0)
Retic Ct Pct: 2.5 % (ref 0.4–3.1)

## 2019-12-27 LAB — CBC WITH DIFFERENTIAL (CANCER CENTER ONLY)
Abs Immature Granulocytes: 0.02 10*3/uL (ref 0.00–0.07)
Basophils Absolute: 0 10*3/uL (ref 0.0–0.1)
Basophils Relative: 1 %
Eosinophils Absolute: 0.2 10*3/uL (ref 0.0–0.5)
Eosinophils Relative: 3 %
HCT: 34.8 % — ABNORMAL LOW (ref 39.0–52.0)
Hemoglobin: 11.4 g/dL — ABNORMAL LOW (ref 13.0–17.0)
Immature Granulocytes: 0 %
Lymphocytes Relative: 24 %
Lymphs Abs: 1.3 10*3/uL (ref 0.7–4.0)
MCH: 30.2 pg (ref 26.0–34.0)
MCHC: 32.8 g/dL (ref 30.0–36.0)
MCV: 92.1 fL (ref 80.0–100.0)
Monocytes Absolute: 0.6 10*3/uL (ref 0.1–1.0)
Monocytes Relative: 11 %
Neutro Abs: 3.3 10*3/uL (ref 1.7–7.7)
Neutrophils Relative %: 61 %
Platelet Count: 213 10*3/uL (ref 150–400)
RBC: 3.78 MIL/uL — ABNORMAL LOW (ref 4.22–5.81)
RDW: 11.9 % (ref 11.5–15.5)
WBC Count: 5.3 10*3/uL (ref 4.0–10.5)
nRBC: 0 % (ref 0.0–0.2)

## 2019-12-27 LAB — IRON AND TIBC
Iron: 87 ug/dL (ref 42–163)
Saturation Ratios: 28 % (ref 20–55)
TIBC: 315 ug/dL (ref 202–409)
UIBC: 228 ug/dL (ref 117–376)

## 2019-12-27 LAB — FERRITIN: Ferritin: 181 ng/mL (ref 24–336)

## 2019-12-27 NOTE — Progress Notes (Signed)
Hematology and Oncology Follow Up Visit  Brent [name] [id number] 02-10-1956 64 y.o. 12/27/2019   Principle Diagnosis:  Iron deficiency anemia secondary tomalabsorptionand history ofacute GI bleed  Current Therapy: IV iron as indicated   Interim History:  Mr. Fountain is here today for follow-up. He is doing well and has no complaints at this time.  He had a right shoulder replacement on 11/14/2019 and has recuperated nicely. He is out of the sling and is doing PT twice a week.  He has had no issues with bleeding. No bruising or petechiae.  No fever, chills, n/v, cough, rash, dizziness, SOB, chest pain, palpitations, abdominal pain or changes in bowel or bladder habits.  No swelling in his extremities.  He has the same tingling in his fingertips due to chronic neck issues. He uses his traction machine as needed to help this.  No falls or syncopal episodes to report.  He has maintained a good appetite and is staying well hydrated. His weight is stable.   ECOG Performance Status: 1 - Symptomatic but completely ambulatory  Medications:  Allergies as of 12/27/2019      Reactions   Bee Venom Anaphylaxis   Lipitor [atorvastatin] Other (See Comments)   Myalgias, memory changes   Morphine Other (See Comments)   hyperactive   Hydrocodone Itching   Pravastatin Other (See Comments)   Made joints hurt. --also simvastatin       Medication List       Accurate as of December 27, 2019  8:43 AM. If you have any questions, ask your nurse or doctor.        amitriptyline 50 MG tablet Commonly known as: ELAVIL TAKE 1 TABLET(50 MG) BY MOUTH AT BEDTIME   cetirizine 10 MG tablet Commonly known as: ZYRTEC Take 10 mg by mouth daily.   cyclobenzaprine 10 MG tablet Commonly known as: FLEXERIL Take 0.5-1 tablets (5-10 mg total) by mouth 2 (two) times daily as needed for muscle spasms.   econazole nitrate 1 % cream Apply topically daily.   EPINEPHrine 0.3 mg/0.3 mL Soaj  injection Commonly known as: EPI-PEN INJECT 0.3 MLS (0.3 MG TOTAL) INTO THE MUSCLE ONCE FOR 1 DOSE   famotidine 40 MG tablet Commonly known as: PEPCID TAKE 1 TABLET AT BEDTIME AS NEEDED FOR HEARTBURN OR INDIGESTION   fenofibrate 160 MG tablet TAKE 1 TABLET DAILY   folic acid 1 MG tablet Commonly known as: FOLVITE Take 1 tablet (1 mg total) by mouth daily.   gabapentin 300 MG capsule Commonly known as: NEURONTIN 1 tab po bid and 2 tabs po qhs   glucose blood test strip Use as directed once daily to check blood sugar.  Diagnosis code E11.9   hydrochlorothiazide 12.5 MG capsule Commonly known as: MICROZIDE Take 1 capsule (12.5 mg total) by mouth daily.   HYDROcodone-acetaminophen 5-325 MG tablet Commonly known as: Norco Take 1 tablet by mouth at bedtime.   IRON-C PO Take by mouth. Iron 65 mg and vit C-125 mg -take one daily   Livalo 1 MG Tabs Generic drug: Pitavastatin Calcium Take 1 tablet (1 mg total) by mouth daily.   losartan 50 MG tablet Commonly known as: COZAAR Take 1 tablet (50 mg total) by mouth daily.   Magnesium 100 MG Caps Take 1 capsule (100 mg total) by mouth daily.   MELATONIN ER PO Take by mouth. Melatonin 12 mg-Take 2 at bedtime.   metFORMIN 500 MG tablet Commonly known as: GLUCOPHAGE Take 1 tablet (500 mg total) by  mouth 4 (four) times daily.   methocarbamol 750 MG tablet Commonly known as: ROBAXIN Take 1 tablet (750 mg total) by mouth 4 (four) times daily.   metoprolol tartrate 100 MG tablet Commonly known as: LOPRESSOR Take 1 tablet (100 mg total) by mouth 2 (two) times daily.   mometasone 0.1 % cream Commonly known as: Elocon Apply 1 application topically daily.   multivitamin tablet Take 1 tablet by mouth daily. BARIATRIC VIT   pantoprazole 40 MG tablet Commonly known as: PROTONIX TAKE 1 TABLET TWICE A DAY BEFORE MEALS   sodium chloride 0.65 % Soln nasal spray Commonly known as: OCEAN Place 1 spray into both nostrils as needed  for congestion (nose irritation).   thiamine 100 MG tablet Take 1 tablet (100 mg total) by mouth daily.   Vitamin B-12 1000 MCG Subl Place 1 each under the tongue every morning.   Vitamin D (Ergocalciferol) 1.25 MG (50000 UNIT) Caps capsule Commonly known as: DRISDOL TAKE 1 CAPSULE EVERY 7 DAYS   VITAMIN D3 PO Take 5,000 Units by mouth daily.   VITAMIN E PO Take 1 tablet by mouth daily.       Allergies:  Allergies  Allergen Reactions  . Bee Venom Anaphylaxis  . Lipitor [Atorvastatin] Other (See Comments)    Myalgias, memory changes  . Morphine Other (See Comments)    hyperactive  . Hydrocodone Itching  . Pravastatin Other (See Comments)    Made joints hurt. --also simvastatin     Past Medical History, Surgical history, Social history, and Family History were reviewed and updated.  Review of Systems: All other 10 point review of systems is negative.   Physical Exam:  vitals were not taken for this visit.   Wt Readings from Last 3 Encounters:  10/27/19 258 lb (117 kg)  09/26/19 270 lb (122.5 kg)  08/25/19 260 lb (117.9 kg)    Ocular: Sclerae unicteric, pupils equal, round and reactive to light Ear-nose-throat: Oropharynx clear, dentition fair Lymphatic: No cervical or supraclavicular adenopathy Lungs no rales or rhonchi, good excursion bilaterally Heart regular rate and rhythm, no murmur appreciated Abd soft, nontender, positive bowel sounds MSK no focal spinal tenderness, no joint edema Neuro: non-focal, well-oriented, appropriate affect Breasts: Deferred   Lab Results  Component Value Date   WBC 5.3 12/27/2019   HGB 11.4 (L) 12/27/2019   HCT 34.8 (L) 12/27/2019   MCV 92.1 12/27/2019   PLT 213 12/27/2019   Lab Results  Component Value Date   FERRITIN 183 09/26/2019   IRON 85 09/26/2019   TIBC 359 09/26/2019   UIBC 274 09/26/2019   IRONPCTSAT 24 09/26/2019   Lab Results  Component Value Date   RETICCTPCT 2.5 12/27/2019   RBC 3.78 (L)  12/27/2019   RBC 3.71 (L) 12/27/2019   No results found for: KPAFRELGTCHN, LAMBDASER, KAPLAMBRATIO No results found for: IGGSERUM, IGA, IGMSERUM No results found for: Odetta Pink, SPEI   Chemistry      Component Value Date/Time   NA 137 07/18/2019 1454   K 4.9 07/18/2019 1454   CL 98 07/18/2019 1454   CO2 22 07/18/2019 1454   BUN 13 07/18/2019 1454   CREATININE 1.03 07/18/2019 1454   CREATININE 0.95 07/14/2018 1021   CREATININE 0.97 11/21/2013 0948      Component Value Date/Time   CALCIUM 9.6 07/18/2019 1454   ALKPHOS 33 (L) 05/03/2019 0755   AST 19 05/03/2019 0755   AST 15 07/14/2018 1021   ALT  14 05/03/2019 0755   ALT 11 07/14/2018 1021   BILITOT 0.7 05/03/2019 0755   BILITOT 0.4 07/14/2018 1021       Impression and Plan: Mr. Zarcone is a very pleasant 64yo caucasian gentleman with iron deficiency anemia secondary to malabsorption (gastric bypass) as well intermittent GI blood loss with ulcer. He continues to do well and has no complaints at this time.  Iron studies are pending. We will replace if needed.  We will plan to see him again in 4 months.  He can contact our office with any questions or concerns.   Laverna Peace, NP 9/8/20218:43 AM

## 2019-12-27 NOTE — Telephone Encounter (Signed)
Called and spoke with patient regarding appointments scheduled per 9/8 los

## 2019-12-27 NOTE — Telephone Encounter (Signed)
Error

## 2019-12-31 ENCOUNTER — Other Ambulatory Visit: Payer: Self-pay | Admitting: Family Medicine

## 2020-01-03 LAB — HM DIABETES EYE EXAM

## 2020-01-04 ENCOUNTER — Telehealth: Payer: Self-pay | Admitting: *Deleted

## 2020-01-04 NOTE — Telephone Encounter (Signed)
  Patient Consent for Virtual Visit         Brent [name] has provided verbal consent on 01/04/2020 for a virtual visit (video or telephone).   CONSENT FOR VIRTUAL VISIT FOR:  Brent [name]  By participating in this virtual visit I agree to the following:  I hereby voluntarily request, consent and authorize [facility] and its employed or contracted physicians, physician assistants, nurse practitioners or other licensed health care professionals (the Practitioner), to provide me with telemedicine health care services (the "Services") as deemed necessary by the treating Practitioner. I acknowledge and consent to receive the Services by the Practitioner via telemedicine. I understand that the telemedicine visit will involve communicating with the Practitioner through live audiovisual communication technology and the disclosure of certain medical information by electronic transmission. I acknowledge that I have been given the opportunity to request an in-person assessment or other available alternative prior to the telemedicine visit and am voluntarily participating in the telemedicine visit.  I understand that I have the right to withhold or withdraw my consent to the use of telemedicine in the course of my care at any time, without affecting my right to future care or treatment, and that the Practitioner or I may terminate the telemedicine visit at any time. I understand that I have the right to inspect all information obtained and/or recorded in the course of the telemedicine visit and may receive copies of available information for a reasonable fee.  I understand that some of the potential risks of receiving the Services via telemedicine include:  [name] Delay or interruption in medical evaluation due to technological equipment failure or disruption; . Information transmitted may not be sufficient (e.g. poor resolution of images) to allow for appropriate medical decision making by the Practitioner;  and/or  . In rare instances, security protocols could fail, causing a breach of personal health information.  Furthermore, I acknowledge that it is my responsibility to provide information about my medical history, conditions and care that is complete and accurate to the best of my ability. I acknowledge that Practitioner's advice, recommendations, and/or decision may be based on factors not within their control, such as incomplete or inaccurate data provided by me or distortions of diagnostic images or specimens that may result from electronic transmissions. I understand that the practice of medicine is not an exact science and that Practitioner makes no warranties or guarantees regarding treatment outcomes. I acknowledge that a copy of this consent can be made available to me via my patient portal ([facility]), or I can request a printed copy by calling the office of [facility].    I understand that my insurance will be billed for this visit.   I have read or had this consent read to me. . I understand the contents of this consent, which adequately explains the benefits and risks of the Services being provided via telemedicine.  . I have been provided ample opportunity to ask questions regarding this consent and the Services and have had my questions answered to my satisfaction. . I give my informed consent for the services to be provided through the use of telemedicine in my medical care

## 2020-01-08 NOTE — Progress Notes (Signed)
Telehealth Visit     Virtual Visit via Telephone Note   This visit type was conducted due to national recommendations for restrictions regarding the COVID-19 Pandemic (e.g. social distancing) in an effort to limit this patient's exposure and mitigate transmission in our community.  Due to his co-morbid illnesses, this patient is at least at moderate risk for complications without adequate follow up.  This format is felt to be most appropriate for this patient at this time.  The patient did not have access to video technology/had technical difficulties with video requiring transitioning to audio format only (telephone).  All issues noted in this document were discussed and addressed.  No physical exam could be performed with this format.  Please refer to the patient's chart for his  consent to telehealth for [facility].   Evaluation Performed:  Follow-up visit   The patient was identified using 2 identifiers.   This visit type was conducted due to national recommendations for restrictions regarding the COVID-19 Pandemic (e.g. social distancing).  This format is felt to be most appropriate for this patient at this time.  All issues noted in this document were discussed and addressed.  No physical exam was performed (except for noted visual exam findings with Video Visits).  Please refer to the patient's chart (MyChart message for video visits and phone note for telephone visits) for the patient's consent to telehealth for [facility].  Date:  01/15/2020   ID:  Brent [name], DOB Jul 13, 1955, MRN [id number]  Patient Location:  Home  Provider location:   Home  PCP:  [name], MD  Cardiologist:   Candee Furbish, MD  Electrophysiologist:  None   Chief Complaint:  Follow up   History of Present Illness:    Brent [name] is a 64 y.o. male who presents via audio/video conferencing for a telehealth visit today.  Seen for Dr. Marlou Porch.   He has a history of known CAD, difficult to  control HTN, DM, HLD, prior cath in 2013 with 70% mid RCA stenosis that is managed medically. Low risk Myoview in 2016. FH + for CAD in his brothers. Has had prior gastric bypass surgery.   Last seen by Dr. Marlou Porch in February and Lopressor was increased for palpitations and Zio was placed. Saw Mickel Baas in March for follow up. The monitor showed average HR of 81 with rare PVCs and rare PAT. No AF. He was feeling better on return.   The patient does not have symptoms concerning for COVID-19 infection (fever, chills, cough, or new shortness of breath).   Seen today by telephone visit. He has consented for this visit. He is getting ready for vacation - going by RV for the next week. BP has gotten better with prior changes. No chest pain. No palpitations. He has missed some doses of his Metformin - A1C shows that. He admits he needs to eat less. Overall, he has no real concerns today.    Past Medical History:  Diagnosis Date  . Acid reflux disease   . ACID REFLUX DISEASE 07/05/2007  . Acute gastric ulcer with bleeding   . Alcohol abuse   . Anemia   . Atherosclerosis   . Benign neoplasm of colon 07/05/2007  . Bilateral hip pain 10/05/2016  . Breast pain, left 11/17/2011  . CAD (coronary artery disease)   . Chest pain    a. Reportedly negative dobut echo performed prior to gastric bypass in 03/2011;  b. 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 12/2011 Cath: LM nl, LAD 50p, D1 22m, LCX min irregs, OM3 30, RCA 25p, 35m (FFR 0.99->0.89), PDA 30, EF 65%, Med Rx.  . COLONIC POLYPS, HX OF 10/29/2008  . Diarrhea 06/13/2010  . Diverticulosis 07/05/2018  . DIVERTICULOSIS, COLON 10/29/2008  . DM (diabetes mellitus), type 2, uncontrolled ([place])   . GI bleed   . Gout 03/04/2013  . Hearing loss 05/24/2013   Previous audiology evaluation completely.  [name] HTN (hypertension) 07/14/2010  . Hx of colonic polyps   . HYPERSOMNIA, ASSOCIATED WITH SLEEP APNEA 07/26/2008  . Hypertension 07/14/2010  . 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 Internal hemorrhoids   . Knee pain, left 10/10/2010  . Morbid obesity ([place]) 03/27/2010   a. 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  [name] Neck pain 03/2015  . Other and unspecified hyperlipidemia 11/16/2012  . Post-operative nausea and vomiting    after the surgery in hospital in March 2020/ past the cauderization  . Preventative health care 11/17/2011  . Renal stone 11/2013  . Sleep apnea    a. CPAP  . Spinal stenosis   . Tear of meniscus of left knee 2012   Past Surgical History:  Procedure Laterality Date  . ANKLE SURGERY Right 1994  . BICEPS TENDON REPAIR     left side  . CARDIAC CATHETERIZATION     denies any chest pain in the past 2 years  . COLONOSCOPY    . colonoscopy polyps    . ESOPHAGOGASTRODUODENOSCOPY N/A 03/27/2013   Procedure: ESOPHAGOGASTRODUODENOSCOPY (EGD);  Surgeon: Irene Shipper, MD;  Location: [facility];  Service: Endoscopy;  Laterality: N/A;  . ESOPHAGOGASTRODUODENOSCOPY (EGD) WITH PROPOFOL N/A 07/06/2018   Procedure: ESOPHAGOGASTRODUODENOSCOPY (EGD) WITH PROPOFOL;  Surgeon: Jerene Bears, MD;  Location: [facility];  Service: Gastroenterology;  Laterality: N/A;  . GASTRIC BYPASS    . HERNIA REPAIR    . HOT HEMOSTASIS N/A 07/06/2018   Procedure: HOT HEMOSTASIS (ARGON PLASMA COAGULATION/BICAP);  Surgeon: Jerene Bears, MD;  Location: [facility];  Service: Gastroenterology;  Laterality: N/A;  . KNEE ARTHROSCOPY Left 11/06/2010   Left, torn meniscus (repaired)  . LEFT HEART CATHETERIZATION WITH CORONARY ANGIOGRAM N/A 12/23/2011   Procedure: LEFT HEART CATHETERIZATION WITH CORONARY ANGIOGRAM;  Surgeon: Minus Breeding, MD;  Location: [facility] C[facility] LAB;  Service: Cardiovascular;  Laterality: N/A;  . REPLACEMENT TOTAL KNEE Right 01/2016   removed scar tissue/ cut tip of nerve bundle and re-route  . right knee arthroscopy Right 07/05/14   Dr. Hart Robinsons, [place].  [name] ROTATOR CUFF REPAIR  2019   left  . SCHLEROTHERAPY  07/06/2018   Procedure: Woodward Ku;  Surgeon:  Jerene Bears, MD;  Location: [facility];  Service: Gastroenterology;;  . TONSILLECTOMY  age 7  . TONSILLECTOMY     as a child  . TOTAL KNEE ARTHROPLASTY Right 01/20/2016   Procedure: RIGHT TOTAL KNEE ARTHROPLASTY;  Surgeon: Paralee Cancel, MD;  Location: [facility];  Service: Orthopedics;  Laterality: Right;  . UPPER GI ENDOSCOPY  03/27/13     Current Meds  Medication Sig  . amitriptyline (ELAVIL) 50 MG tablet TAKE 1 TABLET(50 MG) BY MOUTH AT BEDTIME  . cetirizine (ZYRTEC) 10 MG tablet Take 10 mg by mouth daily.  . Cholecalciferol (VITAMIN D3 PO) Take 5,000 Units by mouth daily.  . Cyanocobalamin (VITAMIN B-12) 1000 MCG SUBL Place 1 each under the tongue 2 (two) times a week.   . cyclobenzaprine (FLEXERIL) 10 MG tablet Take 0.5-1 tablets (5-10 mg total) by mouth 2 (  two) times daily as needed for muscle spasms.  [name] econazole nitrate 1 % cream Apply topically daily.  [name] EPINEPHRINE 0.3 mg/0.3 mL IJ SOAJ injection INJECT 0.3 MLS (0.3 MG TOTAL) INTO THE MUSCLE ONCE FOR 1 DOSE  . famotidine (PEPCID) 40 MG tablet TAKE 1 TABLET AT BEDTIME AS NEEDED FOR HEARTBURN OR INDIGESTION  . fenofibrate 160 MG tablet TAKE 1 TABLET DAILY  . Ferrous Gluconate-C-Folic Acid (IRON-C PO) Take by mouth. Iron 65 mg and vit C-125 mg -take one daily  . folic acid (FOLVITE) 1 MG tablet Take 1 tablet (1 mg total) by mouth daily. (Patient taking differently: Take 2 mg by mouth daily. )  . gabapentin (NEURONTIN) 300 MG capsule 1 tab po bid and 2 tabs po qhs  . glucose blood test strip Use as directed once daily to check blood sugar.  Diagnosis code E11.9  . hydrochlorothiazide (MICROZIDE) 12.5 MG capsule Take 1 capsule (12.5 mg total) by mouth daily.  [name] losartan (COZAAR) 50 MG tablet Take 1 tablet (50 mg total) by mouth daily.  . Magnesium 100 MG CAPS Take 1 capsule (100 mg total) by mouth daily.  [name] MELATONIN ER PO Take by mouth. Melatonin 12 mg-Take 2 at bedtime.  . metFORMIN (GLUCOPHAGE) 500 MG tablet Take 1 tablet (500 mg  total) by mouth 4 (four) times daily.  . metoprolol tartrate (LOPRESSOR) 100 MG tablet Take 1 tablet (100 mg total) by mouth 2 (two) times daily.  . mometasone (ELOCON) 0.1 % cream Apply 1 application topically daily.  . Multiple Vitamin (MULTIVITAMIN) tablet Take 1 tablet by mouth daily. BARIATRIC VIT  . oxycodone (OXY-IR) 5 MG capsule Take 5 mg by mouth in the morning and at bedtime.  . pantoprazole (PROTONIX) 40 MG tablet TAKE 1 TABLET TWICE A DAY BEFORE MEALS  . Pitavastatin Calcium 2 MG TABS Take 1 tablet (2 mg total) by mouth daily.  . sodium chloride (OCEAN) 0.65 % SOLN nasal spray Place 1 spray into both nostrils as needed for congestion (nose irritation).  . thiamine 100 MG tablet Take 1 tablet (100 mg total) by mouth daily.  . Vitamin D, Ergocalciferol, (DRISDOL) 1.25 MG (50000 UNIT) CAPS capsule TAKE 1 CAPSULE EVERY 7 DAYS  . VITAMIN E PO Take 1 tablet by mouth daily.     Allergies:   Bee venom, Lipitor [atorvastatin], Morphine, Hydrocodone, and Pravastatin   Social History   Tobacco Use  . Smoking status: Former Smoker    Packs/day: 1.50    Years: 20.00    Pack years: 30.00    Types: Cigarettes    Quit date: 04/21/1991    Years since quitting: 28.7  . Smokeless tobacco: Never Used  Vaping Use  . Vaping Use: Never used  Substance Use Topics  . Alcohol use: Yes    Alcohol/week: 3.0 standard drinks    Types: 3 Cans of beer per week    Comment: social  . Drug use: No     Family Hx: The patient's family history includes ADD / ADHD in his daughter; Colon polyps in his father; Diabetes in his brother, brother, brother, mother, and sister; Heart attack in his brother and brother; Heart attack (age of onset: 1) in his father; Heart disease in his brother, brother, and brother; Hyperlipidemia in his mother; Hypertension in his father, maternal grandmother, and mother; Obesity in his brother; Stroke in his father, mother, and sister. There is no history of Stomach cancer, Colon  cancer, Esophageal cancer, or Rectal cancer.  ROS:   Please see the history of present illness.   All other systems reviewed are negative.    Objective:    Vital Signs:  BP 135/78   Pulse 82   Ht 5\' 8"  (1.727 m)   Wt 252 lb (114.3 kg)   BMI 38.32 kg/m    Wt Readings from Last 3 Encounters:  01/15/20 252 lb (114.3 kg)  01/11/20 262 lb (118.8 kg)  12/27/19 262 lb 12.8 oz (119.2 kg)    Alert male in no acute distress. Sounds appropriate.    Labs/Other Tests and Data Reviewed:    Lab Results  Component Value Date   WBC 4.5 01/11/2020   HGB 11.8 (L) 01/11/2020   HCT 35.3 (L) 01/11/2020   PLT 233 01/11/2020   GLUCOSE 313 (H) 01/11/2020   CHOL 163 01/11/2020   TRIG 379 (H) 01/11/2020   HDL 34 (L) 01/11/2020   LDLDIRECT 85.0 11/17/2016   LDLCALC 83 01/11/2020   ALT 15 01/11/2020   AST 18 01/11/2020   NA 138 01/11/2020   K 4.6 01/11/2020   CL 101 01/11/2020   CREATININE 1.19 01/11/2020   BUN 16 01/11/2020   CO2 26 01/11/2020   TSH 1.44 01/11/2020   PSA 1.55 06/25/2017   INR 1.3 (H) 07/06/2018   HGBA1C 7.7 (H) 01/11/2020   MICROALBUR 1.1 12/02/2015     BNP (last 3 results) No results for input(s): BNP in the last 8760 hours.  ProBNP (last 3 results) No results for input(s): PROBNP in the last 8760 hours.    Prior CV studies:    The following studies were reviewed today:  Echo 12/2011 Study Conclusions  - Left ventricle: The cavity size was normal. Systolic function was normal. The estimated ejection fraction was in the range of 55% to 60%. Wall motion was normal; there were no regional wall motion abnormalities. Left ventricular diastolic function parameters were normal. - Aortic valve: Mild regurgitation. - Mitral valve: Mild regurgitation. - Left atrium: The atrium was mildly dilated. - Right ventricle: Systolic function was normal. - Pulmonary arteries: Systolic pressure was within the normal range. Impressions:  - Normal  study.  12/23/11 Cardiac Cath Impression: 1. Moderate disease in RCA with FFR of 0.89 suggesting no significant reduction in flow down the RCA.  2. See diagnostic report for other details.   Recommendations: Medical management of moderate CAD.     ASSESSMENT & PLAN:    1. CAD - no worrisome symptoms - needs aggressive CV risk factor modification.   2. HTN - BP good on current regimen - no changes made - he will continue to follow.   3. Palpitations - no longer endorsed.   4. HLD - on statin therapy - recent labs noted.   5. DM - he admits he is missing some doses of Metformin - recommended to set alarms, etc.   6.  COVID-19 Education: The signs and symptoms of COVID-19 were discussed with the patient and how to seek care for testing (follow up with PCP or arrange E-visit).  The importance of social distancing, staying at home, hand hygiene and wearing a mask when out in public were discussed today.  Patient Risk:   After full review of this patient's clinical status, I feel that they are at least moderate risk at this time.  Time:   Today, I have spent 5 minutes with the patient with telehealth technology discussing the above issues.     Medication Adjustments/Labs and Tests Ordered: Current medicines  are reviewed at length with the patient today.  Concerns regarding medicines are outlined above.   Tests Ordered: No orders of the defined types were placed in this encounter.   Medication Changes: No orders of the defined types were placed in this encounter.   Disposition:  FU with Dr. Marlou Porch in 6 months.    Patient is agreeable to this plan and will call if any problems develop in the interim.   Amie Critchley, NP  01/15/2020 3:39 PM    [facility]

## 2020-01-11 ENCOUNTER — Other Ambulatory Visit: Payer: Self-pay

## 2020-01-11 ENCOUNTER — Ambulatory Visit (INDEPENDENT_AMBULATORY_CARE_PROVIDER_SITE_OTHER): Admitting: Family Medicine

## 2020-01-11 VITALS — BP 123/64 | HR 88 | Temp 98.1°F | Resp 13 | Ht 68.0 in | Wt 262.0 lb

## 2020-01-11 DIAGNOSIS — E559 Vitamin D deficiency, unspecified: Secondary | ICD-10-CM | POA: Diagnosis not present

## 2020-01-11 DIAGNOSIS — E1100 Type 2 diabetes mellitus with hyperosmolarity without nonketotic hyperglycemic-hyperosmolar coma (NKHHC): Secondary | ICD-10-CM

## 2020-01-11 DIAGNOSIS — I10 Essential (primary) hypertension: Secondary | ICD-10-CM | POA: Diagnosis not present

## 2020-01-11 DIAGNOSIS — E538 Deficiency of other specified B group vitamins: Secondary | ICD-10-CM | POA: Diagnosis not present

## 2020-01-11 DIAGNOSIS — Z23 Encounter for immunization: Secondary | ICD-10-CM

## 2020-01-11 DIAGNOSIS — E782 Mixed hyperlipidemia: Secondary | ICD-10-CM

## 2020-01-11 NOTE — Patient Instructions (Signed)

## 2020-01-11 NOTE — Assessment & Plan Note (Addendum)
hgba1c unacceptable, minimize simple carbs. Increase exercise as tolerated. Continue current meds but take more consistently. He has frequently missed 2 doses of Metformin most days.

## 2020-01-11 NOTE — Assessment & Plan Note (Signed)
Supplement and monitor 

## 2020-01-12 ENCOUNTER — Encounter: Payer: Self-pay | Admitting: Family Medicine

## 2020-01-12 ENCOUNTER — Other Ambulatory Visit: Payer: Self-pay | Admitting: Family Medicine

## 2020-01-12 LAB — VITAMIN B12: Vitamin B-12: 1935 pg/mL — ABNORMAL HIGH (ref 200–1100)

## 2020-01-12 LAB — CBC
HCT: 35.3 % — ABNORMAL LOW (ref 38.5–50.0)
Hemoglobin: 11.8 g/dL — ABNORMAL LOW (ref 13.2–17.1)
MCH: 30.5 pg (ref 27.0–33.0)
MCHC: 33.4 g/dL (ref 32.0–36.0)
MCV: 91.2 fL (ref 80.0–100.0)
MPV: 11.2 fL (ref 7.5–12.5)
Platelets: 233 10*3/uL (ref 140–400)
RBC: 3.87 10*6/uL — ABNORMAL LOW (ref 4.20–5.80)
RDW: 11.7 % (ref 11.0–15.0)
WBC: 4.5 10*3/uL (ref 3.8–10.8)

## 2020-01-12 LAB — COMPREHENSIVE METABOLIC PANEL
AG Ratio: 2.2 (calc) (ref 1.0–2.5)
ALT: 15 U/L (ref 9–46)
AST: 18 U/L (ref 10–35)
Albumin: 4.4 g/dL (ref 3.6–5.1)
Alkaline phosphatase (APISO): 37 U/L (ref 35–144)
BUN: 16 mg/dL (ref 7–25)
CO2: 26 mmol/L (ref 20–32)
Calcium: 9.3 mg/dL (ref 8.6–10.3)
Chloride: 101 mmol/L (ref 98–110)
Creat: 1.19 mg/dL (ref 0.70–1.25)
Globulin: 2 g/dL (calc) (ref 1.9–3.7)
Glucose, Bld: 313 mg/dL — ABNORMAL HIGH (ref 65–99)
Potassium: 4.6 mmol/L (ref 3.5–5.3)
Sodium: 138 mmol/L (ref 135–146)
Total Bilirubin: 0.5 mg/dL (ref 0.2–1.2)
Total Protein: 6.4 g/dL (ref 6.1–8.1)

## 2020-01-12 LAB — TSH: TSH: 1.44 mIU/L (ref 0.40–4.50)

## 2020-01-12 LAB — VITAMIN D 25 HYDROXY (VIT D DEFICIENCY, FRACTURES): Vit D, 25-Hydroxy: 52 ng/mL (ref 30–100)

## 2020-01-12 LAB — LIPID PANEL
Cholesterol: 163 mg/dL (ref <200)
HDL: 34 mg/dL — ABNORMAL LOW (ref >=40)
LDL Cholesterol (Calc): 83 mg/dL (calc)
Non-HDL Cholesterol (Calc): 129 mg/dL (calc) (ref <130)
Total CHOL/HDL Ratio: 4.8 (calc) (ref <5.0)
Triglycerides: 379 mg/dL — ABNORMAL HIGH (ref <150)

## 2020-01-12 LAB — HEMOGLOBIN A1C
Hgb A1c MFr Bld: 7.7 % of total Hgb — ABNORMAL HIGH (ref <5.7)
Mean Plasma Glucose: 174 (calc)
eAG (mmol/L): 9.7 (calc)

## 2020-01-12 MED ORDER — PITAVASTATIN CALCIUM 2 MG PO TABS: 2.0000 mg | ORAL_TABLET | Freq: Every day | ORAL | 1 refills | Status: DC

## 2020-01-13 NOTE — Assessment & Plan Note (Signed)
Well controlled, no changes to meds. Encouraged heart healthy diet such as the DASH diet and exercise as tolerated. He has been noting systolic BP between 852 to 150. He is reminded to take bp only when able to sit quiet for 10-15 minutes and deep breath. If still elevated he is to let us know.

## 2020-01-13 NOTE — Progress Notes (Signed)
Subjective:    Patient ID: Brent [name], male    DOB: [birth year], 64 y.o.   MRN: [id number]  Chief Complaint  Patient presents with  . Follow-up    Disability License Plate Form    HPI Patient is in today for follow up on chronic medical concerns, no recent febrile illness or hospitalizations. No polyuria or polydipsia. He acknowledges he has not been following a heart healthy diet much has had surgery recently so also is not exercising. He is awaiting his 3rd covid shot and maintaining quarantine and masking. Denies CP/palp/SOB/HA/congestion/fevers/GI or GU c/o. Taking meds as prescribed  Past Medical History:  Diagnosis Date  . Acid reflux disease   . ACID REFLUX DISEASE 07/05/2007  . Acute gastric ulcer with bleeding   . Alcohol abuse   . Anemia   . Atherosclerosis   . Benign neoplasm of colon 07/05/2007  . Bilateral hip pain 10/05/2016  . Breast pain, left 11/17/2011  . CAD (coronary artery disease)   . Chest pain    a. Reportedly negative dobut echo performed prior to gastric bypass in 03/2011;  b. 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 12/2011 Cath: LM nl, LAD 50p, D1 58m, LCX min irregs, OM3 30, RCA 25p, 40m (FFR 0.99->0.89), PDA 30, EF 65%, Med Rx.  . COLONIC POLYPS, HX OF 10/29/2008  . Diarrhea 06/13/2010  . Diverticulosis 07/05/2018  . DIVERTICULOSIS, COLON 10/29/2008  . DM (diabetes mellitus), type 2, uncontrolled ([place])   . GI bleed   . Gout 03/04/2013  . Hearing loss 05/24/2013   Previous audiology evaluation completely.  [name] HTN (hypertension) 07/14/2010  . Hx of colonic polyps   . HYPERSOMNIA, ASSOCIATED WITH SLEEP APNEA 07/26/2008  . Hypertension 07/14/2010  . 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 Internal hemorrhoids   . Knee pain, left 10/10/2010  . Morbid obesity ([place]) 03/27/2010   a. 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  [name] Neck pain 03/2015  . Other and unspecified hyperlipidemia 11/16/2012  . Post-operative nausea and vomiting    after the surgery in hospital in March  2020/ past the cauderization  . Preventative health care 11/17/2011  . Renal stone 11/2013  . Sleep apnea    a. CPAP  . Spinal stenosis   . Tear of meniscus of left knee 2012    Past Surgical History:  Procedure Laterality Date  . ANKLE SURGERY Right 1994  . BICEPS TENDON REPAIR     left side  . CARDIAC CATHETERIZATION     denies any chest pain in the past 2 years  . COLONOSCOPY    . colonoscopy polyps    . ESOPHAGOGASTRODUODENOSCOPY N/A 03/27/2013   Procedure: ESOPHAGOGASTRODUODENOSCOPY (EGD);  Surgeon: Irene Shipper, MD;  Location: [facility];  Service: Endoscopy;  Laterality: N/A;  . ESOPHAGOGASTRODUODENOSCOPY (EGD) WITH PROPOFOL N/A 07/06/2018   Procedure: ESOPHAGOGASTRODUODENOSCOPY (EGD) WITH PROPOFOL;  Surgeon: Jerene Bears, MD;  Location: [facility];  Service: Gastroenterology;  Laterality: N/A;  . GASTRIC BYPASS    . HERNIA REPAIR    . HOT HEMOSTASIS N/A 07/06/2018   Procedure: HOT HEMOSTASIS (ARGON PLASMA COAGULATION/BICAP);  Surgeon: Jerene Bears, MD;  Location: [facility];  Service: Gastroenterology;  Laterality: N/A;  . KNEE ARTHROSCOPY Left 11/06/2010   Left, torn meniscus (repaired)  . LEFT HEART CATHETERIZATION WITH CORONARY ANGIOGRAM N/A 12/23/2011   Procedure: LEFT HEART CATHETERIZATION WITH CORONARY ANGIOGRAM;  Surgeon: Minus Breeding, MD;  Location: [facility];  Service: Cardiovascular;  Laterality: N/A;  .  REPLACEMENT TOTAL KNEE Right 01/2016   removed scar tissue/ cut tip of nerve bundle and re-route  . right knee arthroscopy Right 07/05/14   Dr. Hart Robinsons, [place].  [name] ROTATOR CUFF REPAIR  2019   left  . SCHLEROTHERAPY  07/06/2018   Procedure: Woodward Ku;  Surgeon: Jerene Bears, MD;  Location: [facility];  Service: Gastroenterology;;  . TONSILLECTOMY  age 48  . TONSILLECTOMY     as a child  . TOTAL KNEE ARTHROPLASTY Right 01/20/2016   Procedure: RIGHT TOTAL KNEE ARTHROPLASTY;  Surgeon: Paralee Cancel, MD;  Location: [facility];  Service:  Orthopedics;  Laterality: Right;  . UPPER GI ENDOSCOPY  03/27/13    Family History  Problem Relation Age of Onset  . Diabetes Mother   . Hypertension Mother   . Stroke Mother   . Hyperlipidemia Mother   . Hypertension Father   . Colon polyps Father   . Heart attack Father 33  . Stroke Father   . Heart attack Brother   . Diabetes Brother   . Heart disease Brother   . Heart attack Brother        Multiple  . Diabetes Brother   . Diabetes Sister   . Stroke Sister   . Obesity Brother   . Diabetes Brother   . Heart disease Brother   . Hypertension Maternal Grandmother   . ADD / ADHD Daughter   . Heart disease Brother   . Stomach cancer Neg Hx   . Colon cancer Neg Hx   . Esophageal cancer Neg Hx   . Rectal cancer Neg Hx     Social History   Socioeconomic History  . Marital status: Married    Spouse name: Not on file  . Number of children: 2  . Years of education: Not on file  . Highest education level: Not on file  Occupational History  . Occupation: TEACHER    Employer: [employer]  Tobacco Use  . Smoking status: Former Smoker    Packs/day: 1.50    Years: 20.00    Pack years: 30.00    Types: Cigarettes    Quit date: 04/21/1991    Years since quitting: 28.7  . Smokeless tobacco: Never Used  Vaping Use  . Vaping Use: Never used  Substance and Sexual Activity  . Alcohol use: Yes    Alcohol/week: 3.0 standard drinks    Types: 3 Cans of beer per week    Comment: social  . Drug use: No  . Sexual activity: Not on file  Other Topics Concern  . Not on file  Social History Narrative   Lives with wife in [name].  Does not routinely exercise.   Social Determinants of Health   Financial Resource Strain:   . Difficulty of Paying Living Expenses: Not on file  Food Insecurity:   . Worried About Charity fundraiser in the Last Year: Not on file  . Ran Out of Food in the Last Year: Not on file  Transportation Needs:   . Lack of Transportation (Medical):  Not on file  . Lack of Transportation (Non-Medical): Not on file  Physical Activity:   . Days of Exercise per Week: Not on file  . Minutes of Exercise per Session: Not on file  Stress:   . Feeling of Stress : Not on file  Social Connections:   . Frequency of Communication with Friends and Family: Not on file  . Frequency of Social Gatherings with Friends and  Family: Not on file  . Attends Religious Services: Not on file  . Active Member of Clubs or Organizations: Not on file  . Attends Archivist Meetings: Not on file  . Marital Status: Not on file  Intimate Partner Violence:   . Fear of Current or Ex-Partner: Not on file  . Emotionally Abused: Not on file  . Physically Abused: Not on file  . Sexually Abused: Not on file    Outpatient Medications Prior to Visit  Medication Sig Dispense Refill  . amitriptyline (ELAVIL) 50 MG tablet TAKE 1 TABLET(50 MG) BY MOUTH AT BEDTIME 50 tablet 1  . cetirizine (ZYRTEC) 10 MG tablet Take 10 mg by mouth daily.    . Cholecalciferol (VITAMIN D3 PO) Take 5,000 Units by mouth daily.    . Cyanocobalamin (VITAMIN B-12) 1000 MCG SUBL Place 1 each under the tongue every morning.     . cyclobenzaprine (FLEXERIL) 10 MG tablet Take 0.5-1 tablets (5-10 mg total) by mouth 2 (two) times daily as needed for muscle spasms. 180 tablet 1  . econazole nitrate 1 % cream Apply topically daily. 30 g 1  . EPINEPHRINE 0.3 mg/0.3 mL IJ SOAJ injection INJECT 0.3 MLS (0.3 MG TOTAL) INTO THE MUSCLE ONCE FOR 1 DOSE 2 each 11  . famotidine (PEPCID) 40 MG tablet TAKE 1 TABLET AT BEDTIME AS NEEDED FOR HEARTBURN OR INDIGESTION 90 tablet 3  . fenofibrate 160 MG tablet TAKE 1 TABLET DAILY 90 tablet 3  . Ferrous Gluconate-C-Folic Acid (IRON-C PO) Take by mouth. Iron 65 mg and vit C-125 mg -take one daily    . folic acid (FOLVITE) 1 MG tablet Take 1 tablet (1 mg total) by mouth daily. 30 tablet 0  . gabapentin (NEURONTIN) 300 MG capsule 1 tab po bid and 2 tabs po qhs 360  capsule 1  . glucose blood test strip Use as directed once daily to check blood sugar.  Diagnosis code E11.9 100 each 12  . hydrochlorothiazide (MICROZIDE) 12.5 MG capsule Take 1 capsule (12.5 mg total) by mouth daily. 90 capsule 1  . HYDROcodone-acetaminophen (NORCO) 5-325 MG tablet Take 1 tablet by mouth at bedtime. 30 tablet 0  . losartan (COZAAR) 50 MG tablet Take 1 tablet (50 mg total) by mouth daily. 90 tablet 3  . Magnesium 100 MG CAPS Take 1 capsule (100 mg total) by mouth daily. 30 capsule 1  . MELATONIN ER PO Take by mouth. Melatonin 12 mg-Take 2 at bedtime.    . metFORMIN (GLUCOPHAGE) 500 MG tablet Take 1 tablet (500 mg total) by mouth 4 (four) times daily. 360 tablet 1  . methocarbamol (ROBAXIN) 750 MG tablet Take 1 tablet (750 mg total) by mouth 4 (four) times daily. 120 tablet 1  . metoprolol tartrate (LOPRESSOR) 100 MG tablet Take 1 tablet (100 mg total) by mouth 2 (two) times daily. 180 tablet 3  . mometasone (ELOCON) 0.1 % cream Apply 1 application topically daily. 90 g 1  . Multiple Vitamin (MULTIVITAMIN) tablet Take 1 tablet by mouth daily. BARIATRIC VIT    . oxycodone (OXY-IR) 5 MG capsule Take 5 mg by mouth in the morning and at bedtime.    . pantoprazole (PROTONIX) 40 MG tablet TAKE 1 TABLET TWICE A DAY BEFORE MEALS 180 tablet 3  . sodium chloride (OCEAN) 0.65 % SOLN nasal spray Place 1 spray into both nostrils as needed for congestion (nose irritation). 30 mL 0  . thiamine 100 MG tablet Take 1 tablet (100 mg total)  by mouth daily. 30 tablet 0  . Vitamin D, Ergocalciferol, (DRISDOL) 1.25 MG (50000 UNIT) CAPS capsule TAKE 1 CAPSULE EVERY 7 DAYS 12 capsule 3  . VITAMIN E PO Take 1 tablet by mouth daily.    . Pitavastatin Calcium (LIVALO) 1 MG TABS Take 1 tablet (1 mg total) by mouth daily. 90 tablet 1   No facility-administered medications prior to visit.    Allergies  Allergen Reactions  . Bee Venom Anaphylaxis  . Lipitor [Atorvastatin] Other (See Comments)     Myalgias, memory changes  . Morphine Other (See Comments)    hyperactive  . Hydrocodone Itching  . Pravastatin Other (See Comments)    Made joints hurt. --also simvastatin     ROS     Objective:    Physical Exam Vitals and nursing note reviewed.  Constitutional:      General: He is not in acute distress.    Appearance: He is well-developed.  HENT:     Head: Normocephalic and atraumatic.     Nose: Nose normal.  Eyes:     General:        Right eye: No discharge.        Left eye: No discharge.  Cardiovascular:     Rate and Rhythm: Normal rate and regular rhythm.     Heart sounds: No murmur heard.   Pulmonary:     Effort: Pulmonary effort is normal.     Breath sounds: Normal breath sounds.  Abdominal:     General: Bowel sounds are normal.     Palpations: Abdomen is soft.     Tenderness: There is no abdominal tenderness.  Musculoskeletal:     Cervical back: Normal range of motion and neck supple.  Skin:    General: Skin is warm and dry.  Neurological:     Mental Status: He is alert and oriented to person, place, and time.     BP 123/64 (BP Location: Left Arm, Patient Position: Sitting, Cuff Size: Large)   Pulse 88   Temp 98.1 F (36.7 C) (Oral)   Resp 13   Ht 5\' 8"  (1.727 m)   Wt 262 lb (118.8 kg)   SpO2 95%   BMI 39.84 kg/m  Wt Readings from Last 3 Encounters:  01/11/20 262 lb (118.8 kg)  12/27/19 262 lb 12.8 oz (119.2 kg)  10/27/19 258 lb (117 kg)    Diabetic Foot Exam - Simple   No data filed     Lab Results  Component Value Date   WBC 4.5 01/11/2020   HGB 11.8 (L) 01/11/2020   HCT 35.3 (L) 01/11/2020   PLT 233 01/11/2020   GLUCOSE 313 (H) 01/11/2020   CHOL 163 01/11/2020   TRIG 379 (H) 01/11/2020   HDL 34 (L) 01/11/2020   LDLDIRECT 85.0 11/17/2016   LDLCALC 83 01/11/2020   ALT 15 01/11/2020   AST 18 01/11/2020   NA 138 01/11/2020   K 4.6 01/11/2020   CL 101 01/11/2020   CREATININE 1.19 01/11/2020   BUN 16 01/11/2020   CO2 26  01/11/2020   TSH 1.44 01/11/2020   PSA 1.55 06/25/2017   INR 1.3 (H) 07/06/2018   HGBA1C 7.7 (H) 01/11/2020   MICROALBUR 1.1 12/02/2015    Lab Results  Component Value Date   TSH 1.44 01/11/2020   Lab Results  Component Value Date   WBC 4.5 01/11/2020   HGB 11.8 (L) 01/11/2020   HCT 35.3 (L) 01/11/2020   MCV 91.2 01/11/2020  PLT 233 01/11/2020   Lab Results  Component Value Date   NA 138 01/11/2020   K 4.6 01/11/2020   CO2 26 01/11/2020   GLUCOSE 313 (H) 01/11/2020   BUN 16 01/11/2020   CREATININE 1.19 01/11/2020   BILITOT 0.5 01/11/2020   ALKPHOS 33 (L) 05/03/2019   AST 18 01/11/2020   ALT 15 01/11/2020   PROT 6.4 01/11/2020   ALBUMIN 4.3 05/03/2019   CALCIUM 9.3 01/11/2020   ANIONGAP 8 07/14/2018   GFR 75.33 05/03/2019   Lab Results  Component Value Date   CHOL 163 01/11/2020   Lab Results  Component Value Date   HDL 34 (L) 01/11/2020   Lab Results  Component Value Date   LDLCALC 83 01/11/2020   Lab Results  Component Value Date   TRIG 379 (H) 01/11/2020   Lab Results  Component Value Date   CHOLHDL 4.8 01/11/2020   Lab Results  Component Value Date   HGBA1C 7.7 (H) 01/11/2020       Assessment & Plan:   Problem List Items Addressed This Visit    T2DM (type 2 diabetes mellitus) ([place])    hgba1c unacceptable, minimize simple carbs. Increase exercise as tolerated. Continue current meds but take more consistently. He has frequently missed 2 doses of Metformin most days.       Relevant Orders   Hemoglobin A1c (Completed)   Lipid panel (Completed)   Essential hypertension - Primary    Well controlled, no changes to meds. Encouraged heart healthy diet such as the DASH diet and exercise as tolerated. He has been noting systolic BP between 841 to 150. He is reminded to take bp only when able to sit quiet for 10-15 minutes and deep breath. If still elevated he is to let us know.       Relevant Orders   CBC (Completed)   Comprehensive metabolic  panel (Completed)   Lipid panel (Completed)   TSH (Completed)   Hyperlipidemia, mixed    Tolerating statin, increase Livalo to 2 mg qhs, encouraged heart healthy diet, avoid trans fats, minimize simple carbs and saturated fats. Increase exercise as tolerated      Vitamin D deficiency    Supplement and monitor      Relevant Orders   VITAMIN D 25 Hydroxy (Vit-D Deficiency, Fractures) (Completed)   Vitamin B12 deficiency    Supplement and monitor      Relevant Orders   Vitamin B12 (Completed)    Other Visit Diagnoses    Influenza vaccine administered       Relevant Orders   Flu Vaccine QUAD 36+ mos IM (Fluarix & Fluzone Quad PF (Completed)      I am having Brent [name] "Don" maintain his multivitamin, Vitamin B-12, glucose blood, Magnesium, VITAMIN E PO, folic acid, thiamine, sodium chloride, Cholecalciferol (VITAMIN D3 PO), Ferrous Gluconate-C-Folic Acid (IRON-C PO), MELATONIN ER PO, econazole nitrate, mometasone, EPINEPHrine, famotidine, metFORMIN, gabapentin, HYDROcodone-acetaminophen, methocarbamol, cetirizine, Vitamin D (Ergocalciferol), metoprolol tartrate, losartan, hydrochlorothiazide, pantoprazole, cyclobenzaprine, amitriptyline, fenofibrate, and oxycodone.  No orders of the defined types were placed in this encounter.    [name], MD

## 2020-01-13 NOTE — Assessment & Plan Note (Signed)
Tolerating statin, increase Livalo to 2 mg qhs, encouraged heart healthy diet, avoid trans fats, minimize simple carbs and saturated fats. Increase exercise as tolerated

## 2020-01-15 ENCOUNTER — Other Ambulatory Visit: Payer: Self-pay

## 2020-01-15 ENCOUNTER — Encounter: Payer: Self-pay | Admitting: Nurse Practitioner

## 2020-01-15 ENCOUNTER — Telehealth (INDEPENDENT_AMBULATORY_CARE_PROVIDER_SITE_OTHER): Admitting: Nurse Practitioner

## 2020-01-15 VITALS — BP 135/78 | HR 82 | Ht 68.0 in | Wt 252.0 lb

## 2020-01-15 DIAGNOSIS — E782 Mixed hyperlipidemia: Secondary | ICD-10-CM

## 2020-01-15 DIAGNOSIS — I10 Essential (primary) hypertension: Secondary | ICD-10-CM | POA: Diagnosis not present

## 2020-01-15 DIAGNOSIS — R002 Palpitations: Secondary | ICD-10-CM

## 2020-01-15 DIAGNOSIS — I2510 Atherosclerotic heart disease of native coronary artery without angina pectoris: Secondary | ICD-10-CM | POA: Diagnosis not present

## 2020-01-15 NOTE — Patient Instructions (Addendum)
After Visit Summary:  We will be checking the following labs today - NONE   Medication Instructions:    Continue with your current medicines.    If you need a refill on your cardiac medications before your next appointment, please call your pharmacy.     Testing/Procedures To Be Arranged:  N/A  Follow-Up:   See Dr. Skains in 6 months.     At [facility], you and your health needs are our priority.  As part of our continuing mission to provide you with exceptional heart care, we have created designated Provider Care Teams.  These Care Teams include your primary Cardiologist (physician) and Advanced Practice Providers (APPs -  Physician Assistants and Nurse Practitioners) who all work together to provide you with the care you need, when you need it.  Special Instructions:  . Stay safe, wash your hands for at least 20 seconds and wear a mask when needed.  . It was good to talk with you today.    Call the [facility] office at [phone] if you have any questions, problems or concerns.       

## 2020-01-23 ENCOUNTER — Telehealth: Payer: Self-pay | Admitting: *Deleted

## 2020-01-23 ENCOUNTER — Telehealth: Payer: Self-pay | Admitting: Family Medicine

## 2020-01-23 MED ORDER — GLYBURIDE 5 MG PO TABS: 5.0000 mg | ORAL_TABLET | Freq: Every day | ORAL | 0 refills | Status: DC

## 2020-01-23 NOTE — Telephone Encounter (Signed)
Spoke with patient and he stated that he had a steroid shot in his back this morning.  He also stated that he has has had these injections in June and in July and he did not do any for sugars at that time. He stated that his wife had some Humalog, advised not to use.    Spoke with Dr. Nani Ravens and he gave verbal order to start patient on glyburide 5mg  qd #30 and follow up in a day or two.    Advised patient of this and to make sure that he keeps a log of his blood sugars and also advised that if sugars goes any higher to head to nearest ER or UC.  Also advised again not to use the Humalog.    Dr. Charlett Blake would it be ok if patient follows up with another provider if patient is willing or do you want him to just keep a log of sugars and call back with them?

## 2020-01-23 NOTE — Telephone Encounter (Signed)
See other phone message  

## 2020-01-23 NOTE — Telephone Encounter (Signed)
Patient states blood sugar is 487, patient would like to know what to do .  Please Advise

## 2020-01-24 NOTE — Telephone Encounter (Signed)
He can follow up with another provider but am also willing to reviewe sugar logs with patient

## 2020-01-24 NOTE — Telephone Encounter (Signed)
Patient notified and he will call us in a couple of days with sugar readings

## 2020-01-31 ENCOUNTER — Other Ambulatory Visit: Payer: Self-pay | Admitting: Family Medicine

## 2020-02-07 ENCOUNTER — Telehealth: Payer: Self-pay | Admitting: Internal Medicine

## 2020-02-07 ENCOUNTER — Other Ambulatory Visit: Payer: Self-pay | Admitting: *Deleted

## 2020-02-07 MED ORDER — AMITRIPTYLINE HCL 50 MG PO TABS: ORAL_TABLET | ORAL | 1 refills | Status: DC

## 2020-02-07 NOTE — Telephone Encounter (Signed)
Pt wants to know if it is ok for him to take [place] for inflammation of joints. Pls call him.

## 2020-02-07 NOTE — Telephone Encounter (Signed)
Spoke with pt and he is aware. 

## 2020-02-07 NOTE — Telephone Encounter (Signed)
Pt with history of ulcers/gastric bypass surgery/IDA calling to see if Dr. Henrene Pastor thinks it is ok for him to take [facility], the first liquid-filled aspirin capsulesTM, is now available in both 81 mg and 325 mg. States he has arthritis and was told not to take NSAIDS. Please advise.

## 2020-02-07 NOTE — Telephone Encounter (Signed)
Still a risk of ulcers even with low-dose aspirin.  If this is for arthritis, I would not recommended.  He should talk to his PCP or rheumatologist for other agents that address pain but are not NSAIDs.

## 2020-02-11 ENCOUNTER — Other Ambulatory Visit: Payer: Self-pay

## 2020-02-12 MED ORDER — GLYBURIDE 5 MG PO TABS
5.0000 mg | ORAL_TABLET | Freq: Every day | ORAL | 0 refills | Status: DC
Start: 2020-02-12 — End: 2020-02-19

## 2020-02-19 ENCOUNTER — Other Ambulatory Visit: Payer: Self-pay | Admitting: Family Medicine

## 2020-02-22 ENCOUNTER — Encounter: Payer: Self-pay | Admitting: Pulmonary Disease

## 2020-02-22 ENCOUNTER — Other Ambulatory Visit: Payer: Self-pay

## 2020-02-22 ENCOUNTER — Ambulatory Visit (INDEPENDENT_AMBULATORY_CARE_PROVIDER_SITE_OTHER): Admitting: Pulmonary Disease

## 2020-02-22 VITALS — BP 118/64 | HR 80 | Temp 97.0°F | Ht 69.0 in | Wt 259.0 lb

## 2020-02-22 DIAGNOSIS — Z9989 Dependence on other enabling machines and devices: Secondary | ICD-10-CM

## 2020-02-22 DIAGNOSIS — G4733 Obstructive sleep apnea (adult) (pediatric): Secondary | ICD-10-CM | POA: Diagnosis not present

## 2020-02-22 NOTE — Patient Instructions (Addendum)
History of obstructive sleep apnea Dated machine  We will schedule you for a home sleep study Update you with results once reviewed Contact medical supply company to set you up with a new CPAP  We will follow-up with you tentatively in about 3 months  Call with significant concerns  Continue weight loss efforts.

## 2020-02-22 NOTE — Progress Notes (Signed)
Brent [name]    [id number]    08/15/1955  Primary Care Physician:Blyth, Bonnita Levan, MD  Referring Physician: [name], MD [address] [place],  [place] [address]  Chief complaint:   Patient with obstructive sleep apnea  HPI:  Machine has become dysfunctional, greater than 64 years old Last study was many years back following the study he has lost some weight, was initially started on BiPAP, currently on CPAP of 11 Very compliant with CPAP Continues to benefit from it on a regular basis  Usually goes to bed at 9 PM 15 to 30 minutes to fall asleep 1-2 awakenings Final wake up time 730  He is retired  Last sleep study was performed in lab at [employer] about 90 pounds following his surgery, gained about 20 back  No pertinent occupational history  He does have a history of hypertension, diabetes, hypercholesterolemia  Outpatient Encounter Medications as of 02/22/2020  Medication Sig  . amitriptyline (ELAVIL) 50 MG tablet TAKE 1 TABLET(50 MG) BY MOUTH AT BEDTIME  . cetirizine (ZYRTEC) 10 MG tablet Take 10 mg by mouth daily.  . Cholecalciferol (VITAMIN D3 PO) Take 5,000 Units by mouth daily.  . Cyanocobalamin (VITAMIN B-12) 1000 MCG SUBL Place 1 each under the tongue 2 (two) times a week.   . cyclobenzaprine (FLEXERIL) 10 MG tablet Take 0.5-1 tablets (5-10 mg total) by mouth 2 (two) times daily as needed for muscle spasms. (Patient taking differently: Take 5-10 mg by mouth in the morning and at bedtime. )  . econazole nitrate 1 % cream Apply topically daily.  [name] EPINEPHRINE 0.3 mg/0.3 mL IJ SOAJ injection INJECT 0.3 MLS (0.3 MG TOTAL) INTO THE MUSCLE ONCE FOR 1 DOSE  . famotidine (PEPCID) 40 MG tablet TAKE 1 TABLET AT BEDTIME AS NEEDED FOR HEARTBURN OR INDIGESTION  . fenofibrate 160 MG tablet TAKE 1 TABLET DAILY  . Ferrous Gluconate-C-Folic Acid (IRON-C PO) Take by mouth. Iron 65 mg and vit C-125 mg -take one daily  . folic acid (FOLVITE)  1 MG tablet Take 1 tablet (1 mg total) by mouth daily. (Patient taking differently: Take 2 mg by mouth daily. )  . gabapentin (NEURONTIN) 300 MG capsule 1 tab po bid and 2 tabs po qhs  . glucose blood test strip Use as directed once daily to check blood sugar.  Diagnosis code E11.9  . glyBURIDE (DIABETA) 5 MG tablet TAKE 1 TABLET(5 MG) BY MOUTH DAILY  . hydrochlorothiazide (MICROZIDE) 12.5 MG capsule Take 1 capsule (12.5 mg total) by mouth daily.  [name] losartan (COZAAR) 50 MG tablet Take 1 tablet (50 mg total) by mouth daily.  . Magnesium 100 MG CAPS Take 1 capsule (100 mg total) by mouth daily.  [name] MELATONIN ER PO Take by mouth. Melatonin 12 mg-Take 2 at bedtime.  . metFORMIN (GLUCOPHAGE) 500 MG tablet TAKE 1 TABLET FOUR TIMES A DAY  . metoprolol tartrate (LOPRESSOR) 100 MG tablet Take 1 tablet (100 mg total) by mouth 2 (two) times daily.  . mometasone (ELOCON) 0.1 % cream Apply 1 application topically daily.  . Multiple Vitamin (MULTIVITAMIN) tablet Take 1 tablet by mouth daily. BARIATRIC VIT  . oxycodone (OXY-IR) 5 MG capsule Take 5 mg by mouth in the morning and at bedtime.  . pantoprazole (PROTONIX) 40 MG tablet TAKE 1 TABLET TWICE A DAY BEFORE MEALS  . Pitavastatin Calcium 2 MG TABS Take 1 tablet (2 mg total) by  mouth daily.  . sodium chloride (OCEAN) 0.65 % SOLN nasal spray Place 1 spray into both nostrils as needed for congestion (nose irritation).  . thiamine 100 MG tablet Take 1 tablet (100 mg total) by mouth daily.  . Vitamin D, Ergocalciferol, (DRISDOL) 1.25 MG (50000 UNIT) CAPS capsule TAKE 1 CAPSULE EVERY 7 DAYS  . VITAMIN E PO Take 1 tablet by mouth daily.   No facility-administered encounter medications on file as of 02/22/2020.    Allergies as of 02/22/2020 - Review Complete 02/22/2020  Allergen Reaction Noted  . Bee venom Anaphylaxis 09/10/2010  . Lipitor [atorvastatin] Other (See Comments) 01/12/2012  . Morphine Other (See Comments) 07/14/2018  . Hydrocodone Itching  07/13/2016  . Pravastatin Other (See Comments) 12/15/2015    Past Medical History:  Diagnosis Date  . Acid reflux disease   . ACID REFLUX DISEASE 07/05/2007  . Acute gastric ulcer with bleeding   . Alcohol abuse   . Anemia   . Atherosclerosis   . Benign neoplasm of colon 07/05/2007  . Bilateral hip pain 10/05/2016  . Breast pain, left 11/17/2011  . CAD (coronary artery disease)   . Chest pain    a. Reportedly negative dobut echo performed prior to gastric bypass in 03/2011;  b. 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 12/2011 Cath: LM nl, LAD 50p, D1 57m, LCX min irregs, OM3 30, RCA 25p, 35m (FFR 0.99->0.89), PDA 30, EF 65%, Med Rx.  . COLONIC POLYPS, HX OF 10/29/2008  . Diarrhea 06/13/2010  . Diverticulosis 07/05/2018  . DIVERTICULOSIS, COLON 10/29/2008  . DM (diabetes mellitus), type 2, uncontrolled ([place])   . GI bleed   . Gout 03/04/2013  . Hearing loss 05/24/2013   Previous audiology evaluation completely.  [name] HTN (hypertension) 07/14/2010  . Hx of colonic polyps   . HYPERSOMNIA, ASSOCIATED WITH SLEEP APNEA 07/26/2008  . Hypertension 07/14/2010  . 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 Internal hemorrhoids   . Knee pain, left 10/10/2010  . Morbid obesity ([place]) 03/27/2010   a. 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  [name] Neck pain 03/2015  . Other and unspecified hyperlipidemia 11/16/2012  . Post-operative nausea and vomiting    after the surgery in hospital in March 2020/ past the cauderization  . Preventative health care 11/17/2011  . Renal stone 11/2013  . Sleep apnea    a. CPAP  . Spinal stenosis   . Tear of meniscus of left knee 2012    Past Surgical History:  Procedure Laterality Date  . ANKLE SURGERY Right 1994  . BICEPS TENDON REPAIR     left side  . CARDIAC CATHETERIZATION     denies any chest pain in the past 2 years  . COLONOSCOPY    . colonoscopy polyps    . ESOPHAGOGASTRODUODENOSCOPY N/A 03/27/2013   Procedure: ESOPHAGOGASTRODUODENOSCOPY (EGD);  Surgeon: Irene Shipper, MD;   Location: [facility];  Service: Endoscopy;  Laterality: N/A;  . ESOPHAGOGASTRODUODENOSCOPY (EGD) WITH PROPOFOL N/A 07/06/2018   Procedure: ESOPHAGOGASTRODUODENOSCOPY (EGD) WITH PROPOFOL;  Surgeon: Jerene Bears, MD;  Location: [facility];  Service: Gastroenterology;  Laterality: N/A;  . GASTRIC BYPASS    . HERNIA REPAIR    . HOT HEMOSTASIS N/A 07/06/2018   Procedure: HOT HEMOSTASIS (ARGON PLASMA COAGULATION/BICAP);  Surgeon: Jerene Bears, MD;  Location: [facility];  Service: Gastroenterology;  Laterality: N/A;  . KNEE ARTHROSCOPY Left 11/06/2010   Left, torn meniscus (repaired)  . LEFT HEART CATHETERIZATION WITH CORONARY ANGIOGRAM N/A 12/23/2011   Procedure:  LEFT HEART CATHETERIZATION WITH CORONARY ANGIOGRAM;  Surgeon: Minus Breeding, MD;  Location: [facility] CATH LAB;  Service: Cardiovascular;  Laterality: N/A;  . REPLACEMENT TOTAL KNEE Right 01/2016   removed scar tissue/ cut tip of nerve bundle and re-route  . right knee arthroscopy Right 07/05/14   Dr. Hart Robinsons, [place].  [name] ROTATOR CUFF REPAIR  2019   left  . SCHLEROTHERAPY  07/06/2018   Procedure: Woodward Ku;  Surgeon: Jerene Bears, MD;  Location: [facility];  Service: Gastroenterology;;  . TONSILLECTOMY  age 76  . TONSILLECTOMY     as a child  . TOTAL KNEE ARTHROPLASTY Right 01/20/2016   Procedure: RIGHT TOTAL KNEE ARTHROPLASTY;  Surgeon: Paralee Cancel, MD;  Location: [facility];  Service: Orthopedics;  Laterality: Right;  . UPPER GI ENDOSCOPY  03/27/13    Family History  Problem Relation Age of Onset  . Diabetes Mother   . Hypertension Mother   . Stroke Mother   . Hyperlipidemia Mother   . Hypertension Father   . Colon polyps Father   . Heart attack Father 14  . Stroke Father   . Heart attack Brother   . Diabetes Brother   . Heart disease Brother   . Heart attack Brother        Multiple  . Diabetes Brother   . Diabetes Sister   . Stroke Sister   . Obesity Brother   . Diabetes Brother   . Heart disease Brother    . Hypertension Maternal Grandmother   . ADD / ADHD Daughter   . Heart disease Brother   . Stomach cancer Neg Hx   . Colon cancer Neg Hx   . Esophageal cancer Neg Hx   . Rectal cancer Neg Hx     Social History   Socioeconomic History  . Marital status: Married    Spouse name: Not on file  . Number of children: 2  . Years of education: Not on file  . Highest education level: Not on file  Occupational History  . Occupation: TEACHER    Employer: [employer]  Tobacco Use  . Smoking status: Former Smoker    Packs/day: 1.50    Years: 20.00    Pack years: 30.00    Types: Cigarettes    Quit date: 04/21/1991    Years since quitting: 28.8  . Smokeless tobacco: Never Used  Vaping Use  . Vaping Use: Never used  Substance and Sexual Activity  . Alcohol use: Yes    Alcohol/week: 3.0 standard drinks    Types: 3 Cans of beer per week    Comment: social  . Drug use: No  . Sexual activity: Not on file  Other Topics Concern  . Not on file  Social History Narrative   Lives with wife in [place].  Does not routinely exercise.   Social Determinants of Health   Financial Resource Strain:   . Difficulty of Paying Living Expenses: Not on file  Food Insecurity:   . Worried About Charity fundraiser in the Last Year: Not on file  . Ran Out of Food in the Last Year: Not on file  Transportation Needs:   . Lack of Transportation (Medical): Not on file  . Lack of Transportation (Non-Medical): Not on file  Physical Activity:   . Days of Exercise per Week: Not on file  . Minutes of Exercise per Session: Not on file  Stress:   . Feeling of Stress : Not on file  Social  Connections:   . Frequency of Communication with Friends and Family: Not on file  . Frequency of Social Gatherings with Friends and Family: Not on file  . Attends Religious Services: Not on file  . Active Member of Clubs or Organizations: Not on file  . Attends Archivist Meetings: Not on file  .  Marital Status: Not on file  Intimate Partner Violence:   . Fear of Current or Ex-Partner: Not on file  . Emotionally Abused: Not on file  . Physically Abused: Not on file  . Sexually Abused: Not on file    Review of Systems  Respiratory: Positive for apnea.   Psychiatric/Behavioral: Positive for sleep disturbance.    Vitals:   02/22/20 0929  BP: 118/64  Pulse: 80  Temp: (!) 97 F (36.1 C)  SpO2: 99%     Physical Exam Constitutional:      Appearance: He is obese.  HENT:     Head: Normocephalic.     Nose: No congestion or rhinorrhea.     Mouth/Throat:     Mouth: Mucous membranes are moist.     Comments: Mallampati 4, crowded oropharynx Eyes:     General:        Right eye: No discharge.        Left eye: No discharge.  Cardiovascular:     Rate and Rhythm: Normal rate and regular rhythm.     Heart sounds: No murmur heard.  No friction rub.  Pulmonary:     Effort: No respiratory distress.     Breath sounds: No stridor. No wheezing or rhonchi.  Musculoskeletal:     Cervical back: No rigidity or tenderness.  Psychiatric:        Mood and Affect: Mood normal.    Results of the Epworth flowsheet 02/22/2020  Sitting and reading 0  Watching TV 1  Sitting, inactive in a public place (e.g. a theatre or a meeting) 0  As a passenger in a car for an hour without a break 2  Lying down to rest in the afternoon when circumstances permit 2  Sitting and talking to someone 0  Sitting quietly after a lunch without alcohol 1  In a car, while stopped for a few minutes in traffic 0  Total score 6    Data Reviewed: Previous study not available to be reviewed at present  Compliance data reveals 100% compliance with CPAP with a CPAP setting of 11  Residual AHI 1.5  Assessment:  History of obstructive sleep apnea on CPAP therapy -Current CPAP pressures of 11  Obesity  Diabetes Hypertension  Plan/Recommendations: Encouraged to continue current CPAP use  We will schedule  patient for home sleep study to confirm presence of sleep disordered breathing Update with results Contact DME for CPAP-new CPAP  Follow-up in 3 months   Sherrilyn Rist MD [facility] 02/22/2020, 9:47 AM  CC: [name], MD

## 2020-03-10 ENCOUNTER — Other Ambulatory Visit: Payer: Self-pay | Admitting: Family Medicine

## 2020-03-18 ENCOUNTER — Ambulatory Visit: Attending: Internal Medicine

## 2020-03-18 ENCOUNTER — Other Ambulatory Visit (HOSPITAL_BASED_OUTPATIENT_CLINIC_OR_DEPARTMENT_OTHER): Payer: Self-pay | Admitting: Internal Medicine

## 2020-03-18 DIAGNOSIS — Z23 Encounter for immunization: Secondary | ICD-10-CM

## 2020-03-18 MED FILL — JANSSEN COVID-19 VACCINE 0.: 0.5 | 1 days supply | Qty: 1 | Fill #0

## 2020-03-18 NOTE — Progress Notes (Signed)
   Covid-19 Vaccination Clinic  Name:  CRYSTIAN FRITH    MRN: [id number] DOB: 1955/05/18  03/18/2020  Mr. Ports was observed post Covid-19 immunization for 15 minutes without incident. He was provided with Vaccine Information Sheet and instruction to access the V-Safe system.   Mr. Petter was instructed to call 911 with any severe reactions post vaccine: [name] Difficulty breathing  . Swelling of face and throat  . A fast heartbeat  . A bad rash all over body  . Dizziness and weakness   Immunizations Administered    Name Date Dose VIS Date Route   JANSSEN COVID-19 VACCINE 03/18/2020 12:16 PM 0.5 mL 02/07/2020 Intramuscular   Manufacturer: [employer]   Lot: [id number]   [place]: [id number]

## 2020-03-27 ENCOUNTER — Other Ambulatory Visit: Payer: Self-pay

## 2020-03-27 ENCOUNTER — Ambulatory Visit: Payer: Medicare Other

## 2020-03-27 DIAGNOSIS — G4733 Obstructive sleep apnea (adult) (pediatric): Secondary | ICD-10-CM

## 2020-03-27 DIAGNOSIS — Z9989 Dependence on other enabling machines and devices: Secondary | ICD-10-CM

## 2020-03-28 DIAGNOSIS — G4733 Obstructive sleep apnea (adult) (pediatric): Secondary | ICD-10-CM | POA: Diagnosis not present

## 2020-03-29 ENCOUNTER — Telehealth: Payer: Self-pay | Admitting: Pulmonary Disease

## 2020-03-29 DIAGNOSIS — G4733 Obstructive sleep apnea (adult) (pediatric): Secondary | ICD-10-CM

## 2020-03-29 NOTE — Telephone Encounter (Signed)
ATC patient unable to reach LM to call back office (x1)  

## 2020-03-29 NOTE — Telephone Encounter (Signed)
Call patient  Sleep study result  Date of study: 03/27/2020  Impression: Mild obstructive sleep apnea No significant oxygen desaturations  Recommendation:  (Already on CPAP, upgrading machine)   DME referral  Recommend CPAP therapy for moderate obstructive sleep apnea  Auto titrating CPAP with pressure settings of 5-15 will be appropriate  Encourage weight loss measures  Follow-up in the office 4 to 6 weeks following initiation of treatment

## 2020-04-01 ENCOUNTER — Telehealth: Payer: Self-pay | Admitting: Pulmonary Disease

## 2020-04-01 NOTE — Addendum Note (Signed)
Addended by: Rosana Berger on: 04/01/2020 02:35 PM   Modules accepted: Orders

## 2020-04-01 NOTE — Telephone Encounter (Signed)
Pt returning call regarding HST results - please call [phone]

## 2020-04-01 NOTE — Telephone Encounter (Signed)
Duplicate message see 97/41 message, results and orders given 12/13.

## 2020-04-01 NOTE — Telephone Encounter (Signed)
Spoke with the pt and notified of results/recs per Dr Ander Slade  Pt verbalized understanding  Order sent to [facility]  Pt aware there may be a delay in receiving machine and will call to scheduled his f/u once he gets it

## 2020-04-03 ENCOUNTER — Encounter

## 2020-04-12 ENCOUNTER — Other Ambulatory Visit: Payer: Self-pay | Admitting: Family Medicine

## 2020-04-16 ENCOUNTER — Other Ambulatory Visit: Payer: Self-pay | Admitting: Family Medicine

## 2020-04-16 NOTE — Telephone Encounter (Signed)
Requesting: Flexeril 10 mg  Last Visit: 01/11/2020 Next Visit: none Last Refill: 10/27/2019

## 2020-04-26 ENCOUNTER — Inpatient Hospital Stay: Payer: Medicare Other | Attending: Family | Admitting: Family

## 2020-04-26 ENCOUNTER — Encounter: Payer: Self-pay | Admitting: Family

## 2020-04-26 ENCOUNTER — Telehealth: Payer: Self-pay | Admitting: Family

## 2020-04-26 ENCOUNTER — Inpatient Hospital Stay: Payer: Medicare Other

## 2020-04-26 ENCOUNTER — Other Ambulatory Visit: Payer: Self-pay

## 2020-04-26 VITALS — BP 146/60 | HR 80 | Temp 98.1°F | Resp 17 | Ht 69.0 in | Wt 261.0 lb

## 2020-04-26 DIAGNOSIS — K909 Intestinal malabsorption, unspecified: Secondary | ICD-10-CM | POA: Diagnosis not present

## 2020-04-26 DIAGNOSIS — Z8711 Personal history of peptic ulcer disease: Secondary | ICD-10-CM | POA: Diagnosis not present

## 2020-04-26 DIAGNOSIS — D500 Iron deficiency anemia secondary to blood loss (chronic): Secondary | ICD-10-CM | POA: Insufficient documentation

## 2020-04-26 DIAGNOSIS — Z79899 Other long term (current) drug therapy: Secondary | ICD-10-CM | POA: Diagnosis not present

## 2020-04-26 LAB — CBC WITH DIFFERENTIAL (CANCER CENTER ONLY)
Abs Immature Granulocytes: 0.03 10*3/uL (ref 0.00–0.07)
Basophils Absolute: 0 10*3/uL (ref 0.0–0.1)
Basophils Relative: 1 %
Eosinophils Absolute: 0.2 10*3/uL (ref 0.0–0.5)
Eosinophils Relative: 3 %
HCT: 37.4 % — ABNORMAL LOW (ref 39.0–52.0)
Hemoglobin: 12 g/dL — ABNORMAL LOW (ref 13.0–17.0)
Immature Granulocytes: 1 %
Lymphocytes Relative: 23 %
Lymphs Abs: 1.2 10*3/uL (ref 0.7–4.0)
MCH: 29.9 pg (ref 26.0–34.0)
MCHC: 32.1 g/dL (ref 30.0–36.0)
MCV: 93.3 fL (ref 80.0–100.0)
Monocytes Absolute: 0.5 10*3/uL (ref 0.1–1.0)
Monocytes Relative: 9 %
Neutro Abs: 3.3 10*3/uL (ref 1.7–7.7)
Neutrophils Relative %: 63 %
Platelet Count: 192 10*3/uL (ref 150–400)
RBC: 4.01 MIL/uL — ABNORMAL LOW (ref 4.22–5.81)
RDW: 12.7 % (ref 11.5–15.5)
WBC Count: 5.1 10*3/uL (ref 4.0–10.5)
nRBC: 0 % (ref 0.0–0.2)

## 2020-04-26 LAB — RETICULOCYTES
Immature Retic Fract: 13.8 % (ref 2.3–15.9)
RBC.: 4 MIL/uL — ABNORMAL LOW (ref 4.22–5.81)
Retic Count, Absolute: 114.8 10*3/uL (ref 19.0–186.0)
Retic Ct Pct: 2.9 % (ref 0.4–3.1)

## 2020-04-26 NOTE — Telephone Encounter (Signed)
Appointments scheduled patient has My Chart Access per 1/7 los 

## 2020-04-26 NOTE — Progress Notes (Signed)
Hematology and Oncology Follow Up Visit  Brent [name] [id number] 1955/11/04 65 y.o. 04/26/2020   Principle Diagnosis:  Iron deficiency anemia secondary tomalabsorptionand history ofacute GI bleed  Current Therapy: IV iron as indicated   Interim History:  Brent [name] is here today for follow-up. He is doing well and has no complaints at this time.  He is doing PT himself at home and his shoulder continues to heal nicely.  He has not noted any blood loss. No abnormal bruising. No petechiae.  No fever, chills, n/v, cough, rash, dizziness, SOB, chest pain, palpitations, abdominal pain or changes in bowel or bladder habits.  No swelling, tenderness, numbness or tingling in his extremities at this time. He occasionally has some numbness and tingling in the right hand due to chronic neck issues.  No falls or syncope to report.  He has maintained a good appetite and is staying well hydrated. His weight is stable at 261 lbs.   ECOG Performance Status: 0 - Asymptomatic  Medications:  Allergies as of 04/26/2020      Reactions   Bee Venom Anaphylaxis   Lipitor [atorvastatin] Other (See Comments)   Myalgias, memory changes   Morphine Other (See Comments)   hyperactive   Hydrocodone Itching   Pravastatin Other (See Comments)   Made joints hurt. --also simvastatin       Medication List       Accurate as of April 26, 2020 10:35 AM. If you have any questions, ask your nurse or doctor.        amitriptyline 50 MG tablet Commonly known as: ELAVIL TAKE 1 TABLET(50 MG) BY MOUTH AT BEDTIME   cetirizine 10 MG tablet Commonly known as: ZYRTEC Take 10 mg by mouth daily.   cyclobenzaprine 10 MG tablet Commonly known as: FLEXERIL TAKE ONE-HALF (1/2) TO ONE TABLET TWICE A DAY AS NEEDED FOR MUSCLE SPASMS   econazole nitrate 1 % cream Apply topically daily.   EPINEPHrine 0.3 mg/0.3 mL Soaj injection Commonly known as: EPI-PEN INJECT 0.3 MLS (0.3 MG TOTAL) INTO THE MUSCLE ONCE  FOR 1 DOSE   famotidine 40 MG tablet Commonly known as: PEPCID TAKE 1 TABLET AT BEDTIME AS NEEDED FOR HEARTBURN OR INDIGESTION   fenofibrate 160 MG tablet TAKE 1 TABLET DAILY   folic acid 1 MG tablet Commonly known as: FOLVITE Take 1 tablet (1 mg total) by mouth daily. What changed: how much to take   gabapentin 300 MG capsule Commonly known as: NEURONTIN 1 tab po bid and 2 tabs po qhs   glucose blood test strip Use as directed once daily to check blood sugar.  Diagnosis code E11.9   glyBURIDE 5 MG tablet Commonly known as: DIABETA TAKE 1 TABLET(5 MG) BY MOUTH DAILY   hydrochlorothiazide 12.5 MG capsule Commonly known as: MICROZIDE Take 1 capsule (12.5 mg total) by mouth daily.   IRON-C PO Take by mouth. Iron 65 mg and vit C-125 mg -take one daily   losartan 50 MG tablet Commonly known as: COZAAR Take 1 tablet (50 mg total) by mouth daily.   Magnesium 100 MG Caps Take 1 capsule (100 mg total) by mouth daily.   MELATONIN ER PO Take by mouth. Melatonin 12 mg-Take 2 at bedtime.   metFORMIN 500 MG tablet Commonly known as: GLUCOPHAGE TAKE 1 TABLET FOUR TIMES A DAY   metoprolol tartrate 100 MG tablet Commonly known as: LOPRESSOR Take 1 tablet (100 mg total) by mouth 2 (two) times daily.   mometasone 0.1 % cream  Commonly known as: Elocon Apply 1 application topically daily.   multivitamin tablet Take 1 tablet by mouth daily. BARIATRIC VIT   oxycodone 5 MG capsule Commonly known as: OXY-IR Take 5 mg by mouth in the morning and at bedtime.   pantoprazole 40 MG tablet Commonly known as: PROTONIX TAKE 1 TABLET TWICE A DAY BEFORE MEALS   Pitavastatin Calcium 2 MG Tabs Take 1 tablet (2 mg total) by mouth daily.   sodium chloride 0.65 % Soln nasal spray Commonly known as: OCEAN Place 1 spray into both nostrils as needed for congestion (nose irritation).   thiamine 100 MG tablet Take 1 tablet (100 mg total) by mouth daily.   Vitamin B-12 1000 MCG  Subl Place 1 each under the tongue 2 (two) times a week.   Vitamin D (Ergocalciferol) 1.25 MG (50000 UNIT) Caps capsule Commonly known as: DRISDOL TAKE 1 CAPSULE EVERY 7 DAYS   VITAMIN D3 PO Take 5,000 Units by mouth daily.   VITAMIN E PO Take 1 tablet by mouth daily.       Allergies:  Allergies  Allergen Reactions  . Bee Venom Anaphylaxis  . Lipitor [Atorvastatin] Other (See Comments)    Myalgias, memory changes  . Morphine Other (See Comments)    hyperactive  . Hydrocodone Itching  . Pravastatin Other (See Comments)    Made joints hurt. --also simvastatin     Past Medical History, Surgical history, Social history, and Family History were reviewed and updated.  Review of Systems: All other 10 point review of systems is negative.   Physical Exam:  height is 5\' 9"  (1.753 m) and weight is 261 lb 0.6 oz (118.4 kg). His oral temperature is 98.1 F (36.7 C). His blood pressure is 146/60 (abnormal) and his pulse is 80. His respiration is 17 and oxygen saturation is 100%.   Wt Readings from Last 3 Encounters:  04/26/20 261 lb 0.6 oz (118.4 kg)  02/22/20 259 lb (117.5 kg)  01/15/20 252 lb (114.3 kg)    Ocular: Sclerae unicteric, pupils equal, round and reactive to light Ear-nose-throat: Oropharynx clear, dentition fair Lymphatic: No cervical or supraclavicular adenopathy Lungs no rales or rhonchi, good excursion bilaterally Heart regular rate and rhythm, no murmur appreciated Abd soft, nontender, positive bowel sounds MSK no focal spinal tenderness, no joint edema Neuro: non-focal, well-oriented, appropriate affect Breasts: Deferred   Lab Results  Component Value Date   WBC 5.1 04/26/2020   HGB 12.0 (L) 04/26/2020   HCT 37.4 (L) 04/26/2020   MCV 93.3 04/26/2020   PLT 192 04/26/2020   Lab Results  Component Value Date   FERRITIN 181 12/27/2019   IRON 87 12/27/2019   TIBC 315 12/27/2019   UIBC 228 12/27/2019   IRONPCTSAT 28 12/27/2019   Lab Results   Component Value Date   RETICCTPCT 2.9 04/26/2020   RBC 4.00 (L) 04/26/2020   RBC 4.01 (L) 04/26/2020   No results found for: KPAFRELGTCHN, LAMBDASER, KAPLAMBRATIO No results found for: IGGSERUM, IGA, IGMSERUM No results found for: Ronnald Ramp, A1GS, A2GS, Tillman Sers, SPEI   Chemistry      Component Value Date/Time   NA 138 01/11/2020 1543   NA 137 07/18/2019 1454   K 4.6 01/11/2020 1543   CL 101 01/11/2020 1543   CO2 26 01/11/2020 1543   BUN 16 01/11/2020 1543   BUN 13 07/18/2019 1454   CREATININE 1.19 01/11/2020 1543      Component Value Date/Time   CALCIUM 9.3 01/11/2020 1543  ALKPHOS 33 (L) 05/03/2019 0755   AST 18 01/11/2020 1543   AST 15 07/14/2018 1021   ALT 15 01/11/2020 1543   ALT 11 07/14/2018 1021   BILITOT 0.5 01/11/2020 1543   BILITOT 0.4 07/14/2018 1021       Impression and Plan: Mr. Knutzen is a very pleasant 65yo caucasian gentleman with iron deficiency anemia secondary to malabsorption (gastric bypass) as well intermittent GI blood loss with ulcer. Iron studies are pending. We will replace if needed.  Follow-up in 4 months.  He can contact our office with any questions or concerns.   Laverna Peace, NP 1/7/202210:35 AM

## 2020-04-29 LAB — FERRITIN: Ferritin: 153 ng/mL (ref 24–336)

## 2020-04-29 LAB — IRON AND TIBC
Iron: 93 ug/dL (ref 42–163)
Saturation Ratios: 26 % (ref 20–55)
TIBC: 353 ug/dL (ref 202–409)
UIBC: 259 ug/dL (ref 117–376)

## 2020-05-05 ENCOUNTER — Encounter: Payer: Self-pay | Admitting: Family Medicine

## 2020-05-16 DIAGNOSIS — M519 Unspecified thoracic, thoracolumbar and lumbosacral intervertebral disc disorder: Secondary | ICD-10-CM | POA: Diagnosis not present

## 2020-05-16 DIAGNOSIS — M546 Pain in thoracic spine: Secondary | ICD-10-CM | POA: Diagnosis not present

## 2020-05-23 DIAGNOSIS — S233XXA Sprain of ligaments of thoracic spine, initial encounter: Secondary | ICD-10-CM | POA: Diagnosis not present

## 2020-05-24 DIAGNOSIS — Z5181 Encounter for therapeutic drug level monitoring: Secondary | ICD-10-CM | POA: Diagnosis not present

## 2020-05-24 DIAGNOSIS — Z79899 Other long term (current) drug therapy: Secondary | ICD-10-CM | POA: Diagnosis not present

## 2020-05-24 DIAGNOSIS — M5459 Other low back pain: Secondary | ICD-10-CM | POA: Diagnosis not present

## 2020-05-27 DIAGNOSIS — S233XXA Sprain of ligaments of thoracic spine, initial encounter: Secondary | ICD-10-CM | POA: Diagnosis not present

## 2020-05-30 DIAGNOSIS — S233XXA Sprain of ligaments of thoracic spine, initial encounter: Secondary | ICD-10-CM | POA: Diagnosis not present

## 2020-06-04 DIAGNOSIS — S233XXA Sprain of ligaments of thoracic spine, initial encounter: Secondary | ICD-10-CM | POA: Diagnosis not present

## 2020-06-07 DIAGNOSIS — S233XXA Sprain of ligaments of thoracic spine, initial encounter: Secondary | ICD-10-CM | POA: Diagnosis not present

## 2020-06-11 DIAGNOSIS — S233XXA Sprain of ligaments of thoracic spine, initial encounter: Secondary | ICD-10-CM | POA: Diagnosis not present

## 2020-06-13 ENCOUNTER — Other Ambulatory Visit: Payer: Self-pay | Admitting: Cardiology

## 2020-06-13 DIAGNOSIS — S233XXA Sprain of ligaments of thoracic spine, initial encounter: Secondary | ICD-10-CM | POA: Diagnosis not present

## 2020-06-15 ENCOUNTER — Other Ambulatory Visit: Payer: Self-pay | Admitting: Family Medicine

## 2020-06-16 ENCOUNTER — Encounter: Payer: Self-pay | Admitting: Family Medicine

## 2020-06-18 ENCOUNTER — Encounter: Payer: Self-pay | Admitting: Internal Medicine

## 2020-06-18 ENCOUNTER — Other Ambulatory Visit: Payer: Self-pay

## 2020-06-18 ENCOUNTER — Ambulatory Visit (INDEPENDENT_AMBULATORY_CARE_PROVIDER_SITE_OTHER): Payer: Medicare Other | Admitting: Internal Medicine

## 2020-06-18 VITALS — BP 116/64 | HR 66 | Temp 98.4°F | Resp 16 | Ht 69.0 in | Wt 262.4 lb

## 2020-06-18 DIAGNOSIS — R310 Gross hematuria: Secondary | ICD-10-CM

## 2020-06-18 DIAGNOSIS — R319 Hematuria, unspecified: Secondary | ICD-10-CM

## 2020-06-18 LAB — URINALYSIS, ROUTINE W REFLEX MICROSCOPIC
Bilirubin Urine: NEGATIVE
Hgb urine dipstick: NEGATIVE
Ketones, ur: NEGATIVE
Leukocytes,Ua: NEGATIVE
Nitrite: NEGATIVE
RBC / HPF: NONE SEEN (ref 0–?)
Specific Gravity, Urine: 1.015 (ref 1.000–1.030)
Total Protein, Urine: NEGATIVE
Urine Glucose: NEGATIVE
Urobilinogen, UA: 0.2 (ref 0.0–1.0)
pH: 6 (ref 5.0–8.0)

## 2020-06-18 LAB — POC URINALSYSI DIPSTICK (AUTOMATED)
Bilirubin, UA: NEGATIVE
Blood, UA: NEGATIVE
Glucose, UA: NEGATIVE
Ketones, UA: NEGATIVE
Leukocytes, UA: NEGATIVE
Nitrite, UA: NEGATIVE
Protein, UA: NEGATIVE
Spec Grav, UA: 1.02 (ref 1.010–1.025)
Urobilinogen, UA: 0.2 E.U./dL
pH, UA: 6 (ref 5.0–8.0)

## 2020-06-18 NOTE — Progress Notes (Signed)
Subjective:    Patient ID: Brent [name], male    DOB: 1955/09/01, 65 y.o.   MRN: [id number]  DOS:  06/18/2020 Type of visit - description: Acute 3 weeks ago, he saw blood in the urine 3 times. At that time there was no other symptoms except perhaps mild dysuria. He has a history of kidney stone but that has not been an issue in years.    Review of Systems No fever chills Has a chronic back pain, unchanged, mild, episodic. No abdominal pain. No nausea or vomiting No urinary urgency or frequency.  Past Medical History:  Diagnosis Date  . Acid reflux disease   . ACID REFLUX DISEASE 07/05/2007  . Acute gastric ulcer with bleeding   . Alcohol abuse   . Anemia   . Atherosclerosis   . Benign neoplasm of colon 07/05/2007  . Bilateral hip pain 10/05/2016  . Breast pain, left 11/17/2011  . CAD (coronary artery disease)   . Chest pain    a. Reportedly negative dobut echo performed prior to gastric bypass in 03/2011;  b. 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 12/2011 Cath: LM nl, LAD 50p, D1 19m, LCX min irregs, OM3 30, RCA 25p, 61m (FFR 0.99->0.89), PDA 30, EF 65%, Med Rx.  . COLONIC POLYPS, HX OF 10/29/2008  . Diarrhea 06/13/2010  . Diverticulosis 07/05/2018  . DIVERTICULOSIS, COLON 10/29/2008  . DM (diabetes mellitus), type 2, uncontrolled ([place])   . GI bleed   . Gout 03/04/2013  . Hearing loss 05/24/2013   Previous audiology evaluation completely.  [name] HTN (hypertension) 07/14/2010  . Hx of colonic polyps   . HYPERSOMNIA, ASSOCIATED WITH SLEEP APNEA 07/26/2008  . Hypertension 07/14/2010  . 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 Internal hemorrhoids   . Knee pain, left 10/10/2010  . Morbid obesity ([place]) 03/27/2010   a. 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  [name] Neck pain 03/2015  . Other and unspecified hyperlipidemia 11/16/2012  . Post-operative nausea and vomiting    after the surgery in hospital in March 2020/ past the cauderization  . Preventative health care 11/17/2011  . Renal stone 11/2013  .  Sleep apnea    a. CPAP  . Spinal stenosis   . Tear of meniscus of left knee 2012    Past Surgical History:  Procedure Laterality Date  . ANKLE SURGERY Right 1994  . BICEPS TENDON REPAIR     left side  . CARDIAC CATHETERIZATION     denies any chest pain in the past 2 years  . COLONOSCOPY    . colonoscopy polyps    . ESOPHAGOGASTRODUODENOSCOPY N/A 03/27/2013   Procedure: ESOPHAGOGASTRODUODENOSCOPY (EGD);  Surgeon: Irene Shipper, MD;  Location: [facility];  Service: Endoscopy;  Laterality: N/A;  . ESOPHAGOGASTRODUODENOSCOPY (EGD) WITH PROPOFOL N/A 07/06/2018   Procedure: ESOPHAGOGASTRODUODENOSCOPY (EGD) WITH PROPOFOL;  Surgeon: Jerene Bears, MD;  Location: [facility];  Service: Gastroenterology;  Laterality: N/A;  . GASTRIC BYPASS    . HERNIA REPAIR    . HOT HEMOSTASIS N/A 07/06/2018   Procedure: HOT HEMOSTASIS (ARGON PLASMA COAGULATION/BICAP);  Surgeon: Jerene Bears, MD;  Location: [facility];  Service: Gastroenterology;  Laterality: N/A;  . KNEE ARTHROSCOPY Left 11/06/2010   Left, torn meniscus (repaired)  . LEFT HEART CATHETERIZATION WITH CORONARY ANGIOGRAM N/A 12/23/2011   Procedure: LEFT HEART CATHETERIZATION WITH CORONARY ANGIOGRAM;  Surgeon: Minus Breeding, MD;  Location[facility] LAB;  Service: Cardiovascular;  Laterality: N/A;  . REPLACEMENT TOTAL KNEE Right 01/2016  removed scar tissue/ cut tip of nerve bundle and re-route  . right knee arthroscopy Right 07/05/14   Dr. Hart Robinsons, [place].  [name] ROTATOR CUFF REPAIR  2019   left  . SCHLEROTHERAPY  07/06/2018   Procedure: Woodward Ku;  Surgeon: Jerene Bears, MD;  Location: [facility];  Service: Gastroenterology;;  . TONSILLECTOMY  age 14  . TONSILLECTOMY     as a child  . TOTAL KNEE ARTHROPLASTY Right 01/20/2016   Procedure: RIGHT TOTAL KNEE ARTHROPLASTY;  Surgeon: Paralee Cancel, MD;  Location: [facility];  Service: Orthopedics;  Laterality: Right;  . UPPER GI ENDOSCOPY  03/27/13    Allergies as of 06/18/2020       Reactions   Bee Venom Anaphylaxis   Lipitor [atorvastatin] Other (See Comments)   Myalgias, memory changes   Morphine Other (See Comments)   hyperactive   Hydrocodone Itching   Pravastatin Other (See Comments)   Made joints hurt. --also simvastatin       Medication List       Accurate as of June 18, 2020 11:59 PM. If you have any questions, ask your nurse or doctor.        STOP taking these medications   MELATONIN ER PO Stopped by: Kathlene November, MD     TAKE these medications   amitriptyline 50 MG tablet Commonly known as: ELAVIL TAKE 1 TABLET(50 MG) BY MOUTH AT BEDTIME   cetirizine 10 MG tablet Commonly known as: ZYRTEC Take 10 mg by mouth daily.   cyclobenzaprine 10 MG tablet Commonly known as: FLEXERIL TAKE ONE-HALF (1/2) TO ONE TABLET TWICE A DAY AS NEEDED FOR MUSCLE SPASMS   econazole nitrate 1 % cream Apply topically daily.   EPINEPHrine 0.3 mg/0.3 mL Soaj injection Commonly known as: EPI-PEN INJECT 0.3 MLS (0.3 MG TOTAL) INTO THE MUSCLE ONCE FOR 1 DOSE   famotidine 40 MG tablet Commonly known as: PEPCID TAKE 1 TABLET AT BEDTIME AS NEEDED FOR HEARTBURN OR INDIGESTION   fenofibrate 160 MG tablet TAKE 1 TABLET DAILY   folic acid 1 MG tablet Commonly known as: FOLVITE Take 1 tablet (1 mg total) by mouth daily. What changed: how much to take   gabapentin 300 MG capsule Commonly known as: NEURONTIN 1 tab po bid and 2 tabs po qhs   glucose blood test strip Use as directed once daily to check blood sugar.  Diagnosis code E11.9   glyBURIDE 5 MG tablet Commonly known as: DIABETA TAKE 1 TABLET DAILY   hydrochlorothiazide 12.5 MG capsule Commonly known as: MICROZIDE Take 1 capsule (12.5 mg total) by mouth daily.   IRON-C PO Take by mouth. Iron 65 mg and vit C-125 mg -take one daily   losartan 50 MG tablet Commonly known as: COZAAR TAKE 1 TABLET DAILY   Magnesium 100 MG Caps Take 1 capsule (100 mg total) by mouth daily.   metFORMIN 500 MG  tablet Commonly known as: GLUCOPHAGE TAKE 1 TABLET FOUR TIMES A DAY   metoprolol tartrate 100 MG tablet Commonly known as: LOPRESSOR Take 1 tablet (100 mg total) by mouth 2 (two) times daily.   mometasone 0.1 % cream Commonly known as: Elocon Apply 1 application topically daily.   multivitamin tablet Take 1 tablet by mouth daily. BARIATRIC VIT   oxycodone 5 MG capsule Commonly known as: OXY-IR Take 5 mg by mouth in the morning and at bedtime.   pantoprazole 40 MG tablet Commonly known as: PROTONIX TAKE 1 TABLET TWICE A DAY BEFORE MEALS  Pitavastatin Calcium 2 MG Tabs Take 1 tablet (2 mg total) by mouth daily.   sodium chloride 0.65 % Soln nasal spray Commonly known as: OCEAN Place 1 spray into both nostrils as needed for congestion (nose irritation).   thiamine 100 MG tablet Take 1 tablet (100 mg total) by mouth daily.   Vitamin B-12 1000 MCG Subl Place 1 each under the tongue 2 (two) times a week.   Vitamin D (Ergocalciferol) 1.25 MG (50000 UNIT) Caps capsule Commonly known as: DRISDOL TAKE 1 CAPSULE EVERY 7 DAYS   VITAMIN D3 PO Take 5,000 Units by mouth daily.   VITAMIN E PO Take 1 tablet by mouth daily.          Objective:   Physical Exam BP 116/64 (BP Location: Left Arm, Patient Position: Sitting, Cuff Size: Normal)   Pulse 66   Temp 98.4 F (36.9 C) (Oral)   Resp 16   Ht 5\' 9"  (1.753 m)   Wt 262 lb 6 oz (119 kg)   SpO2 96%   BMI 38.75 kg/m  General:   Well developed, NAD, BMI noted. HEENT:  Normocephalic . Face symmetric, atraumatic Lungs:  CTA B Normal respiratory effort, no intercostal retractions, no accessory muscle use. Heart: RRR,  no murmur. Abdomen: Not distended, soft, nontender.  No CVA tenderness Lower extremities: no pretibial edema bilaterally  Skin: Not pale. Not jaundice Neurologic:  alert & oriented X3.  Speech normal, gait appropriate for age and unassisted Psych--  Cognition and judgment appear intact.  Cooperative  with normal attention span and concentration.  Behavior appropriate. No anxious or depressed appearing.      Assessment    65 year old gentleman, PMH includes HTN, CAD, OSA on CPAP, history of GI bleed, diabetes, former smoker, remote kidney stones,  presents with:  Painless gross hematuria: Had 3 episodes -on a single day-of gross hematuria, now asymptomatic. ROS is essentially negative. He has a remote history of kidney stone, an abdominal CT March 2020 showed no stones  U dip: Negative. Plan:  Refer to urology for consideration of further eval of gross hematuria noting that he had no prostatitis type of symptoms. We will get a UA urine culture, call if symptoms resurface.   This visit occurred during the SARS-CoV-2 public health emergency.  Safety protocols were in place, including screening questions prior to the visit, additional usage of staff PPE, and extensive cleaning of exam room while observing appropriate contact time as indicated for disinfecting solutions.

## 2020-06-18 NOTE — Patient Instructions (Signed)
We are referring you to urology.  If you see more blood in the urine or if you have pain, fever, chills: Please call the office

## 2020-06-18 NOTE — Progress Notes (Signed)
Pre visit review using our clinic review tool, if applicable. No additional management support is needed unless otherwise documented below in the visit note. 

## 2020-06-19 LAB — URINE CULTURE
MICRO NUMBER:: 11592268
Result:: NO GROWTH
SPECIMEN QUALITY:: ADEQUATE

## 2020-06-21 DIAGNOSIS — S233XXA Sprain of ligaments of thoracic spine, initial encounter: Secondary | ICD-10-CM | POA: Diagnosis not present

## 2020-06-25 ENCOUNTER — Other Ambulatory Visit: Payer: Self-pay | Admitting: Family Medicine

## 2020-06-25 DIAGNOSIS — S233XXA Sprain of ligaments of thoracic spine, initial encounter: Secondary | ICD-10-CM | POA: Diagnosis not present

## 2020-06-27 DIAGNOSIS — M5459 Other low back pain: Secondary | ICD-10-CM | POA: Diagnosis not present

## 2020-06-28 ENCOUNTER — Other Ambulatory Visit: Payer: Self-pay | Admitting: Family Medicine

## 2020-07-03 ENCOUNTER — Other Ambulatory Visit: Payer: Self-pay | Admitting: Family

## 2020-07-05 ENCOUNTER — Ambulatory Visit (INDEPENDENT_AMBULATORY_CARE_PROVIDER_SITE_OTHER): Payer: Medicare Other | Admitting: Cardiology

## 2020-07-05 ENCOUNTER — Other Ambulatory Visit: Payer: Self-pay

## 2020-07-05 ENCOUNTER — Encounter: Payer: Self-pay | Admitting: Cardiology

## 2020-07-05 VITALS — BP 120/70 | HR 70 | Ht 69.0 in | Wt 262.0 lb

## 2020-07-05 DIAGNOSIS — R002 Palpitations: Secondary | ICD-10-CM

## 2020-07-05 DIAGNOSIS — I2510 Atherosclerotic heart disease of native coronary artery without angina pectoris: Secondary | ICD-10-CM

## 2020-07-05 DIAGNOSIS — E782 Mixed hyperlipidemia: Secondary | ICD-10-CM | POA: Diagnosis not present

## 2020-07-05 NOTE — Progress Notes (Signed)
Cardiology Office Note:    Date:  07/05/2020   ID:  Brent [name], Brent [name] Jun 09, 1955, MRN [id number]  PCP:  [name], MD   [place]  Cardiologist:  Candee Furbish, MD  Advanced Practice Provider:  No care team member to display Electrophysiologist:  None       Referring MD: [name], MD     History of Present Illness:    Brent [name] is a 65 y.o. male here for the follow-up of CAD.  Difficulty control hypertension diabetes hypertension lipidemia cardiac catheterization 2013 with 70% mid RCA managed medically.  Prior nuclear stress test low risk in 2016.  In 2012 had gastric bypass.  Was able to come off of quite a few medications.  Overall been feeling well no chest pain fever chills nausea vomiting syncope.  Palpitations have improved.  Past Medical History:  Diagnosis Date  . Acid reflux disease   . ACID REFLUX DISEASE 07/05/2007  . Acute gastric ulcer with bleeding   . Alcohol abuse   . Anemia   . Atherosclerosis   . Benign neoplasm of colon 07/05/2007  . Bilateral hip pain 10/05/2016  . Breast pain, left 11/17/2011  . CAD (coronary artery disease)   . Chest pain    a. Reportedly negative dobut echo performed prior to gastric bypass in 03/2011;  b. 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 12/2011 Cath: LM nl, LAD 50p, D1 67m, LCX min irregs, OM3 30, RCA 25p, 75m (FFR 0.99->0.89), PDA 30, EF 65%, Med Rx.  . COLONIC POLYPS, HX OF 10/29/2008  . Diarrhea 06/13/2010  . Diverticulosis 07/05/2018  . DIVERTICULOSIS, COLON 10/29/2008  . DM (diabetes mellitus), type 2, uncontrolled ([place])   . GI bleed   . Gout 03/04/2013  . Hearing loss 05/24/2013   Previous audiology evaluation completely.  [name] HTN (hypertension) 07/14/2010  . Hx of colonic polyps   . HYPERSOMNIA, ASSOCIATED WITH SLEEP APNEA 07/26/2008  . Hypertension 07/14/2010  . 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 Internal hemorrhoids   . Knee pain, left 10/10/2010  . Morbid obesity ([place])  03/27/2010   a. 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  [name] Neck pain 03/2015  . Other and unspecified hyperlipidemia 11/16/2012  . Post-operative nausea and vomiting    after the surgery in hospital in March 2020/ past the cauderization  . Preventative health care 11/17/2011  . Renal stone 11/2013  . Sleep apnea    a. CPAP  . Spinal stenosis   . Tear of meniscus of left knee 2012    Past Surgical History:  Procedure Laterality Date  . ANKLE SURGERY Right 1994  . BICEPS TENDON REPAIR     left side  . CARDIAC CATHETERIZATION     denies any chest pain in the past 2 years  . COLONOSCOPY    . colonoscopy polyps    . ESOPHAGOGASTRODUODENOSCOPY N/A 03/27/2013   Procedure: ESOPHAGOGASTRODUODENOSCOPY (EGD);  Surgeon: Irene Shipper, MD;  Location: [facility];  Service: Endoscopy;  Laterality: N/A;  . ESOPHAGOGASTRODUODENOSCOPY (EGD) WITH PROPOFOL N/A 07/06/2018   Procedure: ESOPHAGOGASTRODUODENOSCOPY (EGD) WITH PROPOFOL;  Surgeon: Jerene Bears, MD;  Location: [facility];  Service: Gastroenterology;  Laterality: N/A;  . GASTRIC BYPASS    . HERNIA REPAIR    . HOT HEMOSTASIS N/A 07/06/2018   Procedure: HOT HEMOSTASIS (ARGON PLASMA COAGULATION/BICAP);  Surgeon: Jerene Bears, MD;  Location: [facility];  Service: Gastroenterology;  Laterality: N/A;  . KNEE ARTHROSCOPY Left  11/06/2010   Left, torn meniscus (repaired)  . LEFT HEART CATHETERIZATION WITH CORONARY ANGIOGRAM N/A 12/23/2011   Procedure: LEFT HEART CATHETERIZATION WITH CORONARY ANGIOGRAM;  Surgeon: Minus Breeding, MD;  Location: [facility] CATH LAB;  Service: Cardiovascular;  Laterality: N/A;  . REPLACEMENT TOTAL KNEE Right 01/2016   removed scar tissue/ cut tip of nerve bundle and re-route  . right knee arthroscopy Right 07/05/14   Dr. Hart Robinsons, [place].  [name] ROTATOR CUFF REPAIR  2019   left  . SCHLEROTHERAPY  07/06/2018   Procedure: Woodward Ku;  Surgeon: Jerene Bears, MD;  Location: [facility];  Service: Gastroenterology;;  . TONSILLECTOMY   age 34  . TONSILLECTOMY     as a child  . TOTAL KNEE ARTHROPLASTY Right 01/20/2016   Procedure: RIGHT TOTAL KNEE ARTHROPLASTY;  Surgeon: Paralee Cancel, MD;  Location: [facility];  Service: Orthopedics;  Laterality: Right;  . UPPER GI ENDOSCOPY  03/27/13    Current Medications: Current Meds  Medication Sig  . amitriptyline (ELAVIL) 50 MG tablet TAKE 1 TABLET(50 MG) BY MOUTH AT BEDTIME  . cetirizine (ZYRTEC) 10 MG tablet Take 10 mg by mouth daily.  . Cholecalciferol (VITAMIN D3 PO) Take 5,000 Units by mouth daily.  . Cyanocobalamin (VITAMIN B-12) 1000 MCG SUBL Place 1 each under the tongue 2 (two) times a week.   . cyclobenzaprine (FLEXERIL) 10 MG tablet TAKE ONE-HALF (1/2) TO ONE TABLET TWICE A DAY AS NEEDED FOR MUSCLE SPASMS  . econazole nitrate 1 % cream Apply topically daily.  [name] EPINEPHRINE 0.3 mg/0.3 mL IJ SOAJ injection INJECT 0.3 MLS (0.3 MG TOTAL) INTO THE MUSCLE ONCE FOR 1 DOSE  . famotidine (PEPCID) 40 MG tablet TAKE 1 TABLET AT BEDTIME AS NEEDED FOR HEARTBURN OR INDIGESTION  . fenofibrate 160 MG tablet TAKE 1 TABLET DAILY  . Ferrous Gluconate-C-Folic Acid (IRON-C PO) Take by mouth. Iron 65 mg and vit C-125 mg -take one daily  . folic acid (FOLVITE) 1 MG tablet Take 1 tablet (1 mg total) by mouth daily. (Patient taking differently: Take 2 mg by mouth daily.)  . gabapentin (NEURONTIN) 300 MG capsule 1 tab po bid and 2 tabs po qhs  . glucose blood test strip Use as directed once daily to check blood sugar.  Diagnosis code E11.9  . glyBURIDE (DIABETA) 5 MG tablet TAKE 1 TABLET DAILY  . hydrochlorothiazide (MICROZIDE) 12.5 MG capsule Take 1 capsule (12.5 mg total) by mouth daily.  [name] LIVALO 2 MG TABS TAKE 1 TABLET DAILY  . losartan (COZAAR) 50 MG tablet TAKE 1 TABLET DAILY  . Magnesium 100 MG CAPS Take 1 capsule (100 mg total) by mouth daily.  . metFORMIN (GLUCOPHAGE) 500 MG tablet TAKE 1 TABLET FOUR TIMES A DAY  . metoprolol tartrate (LOPRESSOR) 100 MG tablet Take 1 tablet (100 mg total)  by mouth 2 (two) times daily.  . mometasone (ELOCON) 0.1 % cream Apply 1 application topically daily.  . Multiple Vitamin (MULTIVITAMIN) tablet Take 1 tablet by mouth daily. BARIATRIC VIT  . oxycodone (OXY-IR) 5 MG capsule Take 5 mg by mouth in the morning and at bedtime.  . pantoprazole (PROTONIX) 40 MG tablet TAKE 1 TABLET TWICE A DAY BEFORE MEALS  . sodium chloride (OCEAN) 0.65 % SOLN nasal spray Place 1 spray into both nostrils as needed for congestion (nose irritation).  . thiamine 100 MG tablet Take 1 tablet (100 mg total) by mouth daily.  . Vitamin D, Ergocalciferol, (DRISDOL) 1.25 MG (50000 UNIT) CAPS capsule TAKE 1  CAPSULE EVERY 7 DAYS  . VITAMIN E PO Take 1 tablet by mouth daily.     Allergies:   Bee venom, Lipitor [atorvastatin], Morphine, Hydrocodone, and Pravastatin   Social History   Socioeconomic History  . Marital status: Married    Spouse name: Not on file  . Number of children: 2  . Years of education: Not on file  . Highest education level: Not on file  Occupational History  . Occupation: TEACHER    Employer: [employer]  Tobacco Use  . Smoking status: Former Smoker    Packs/day: 1.50    Years: 20.00    Pack years: 30.00    Types: Cigarettes    Quit date: 04/21/1991    Years since quitting: 29.2  . Smokeless tobacco: Never Used  Vaping Use  . Vaping Use: Never used  Substance and Sexual Activity  . Alcohol use: Yes    Alcohol/week: 3.0 standard drinks    Types: 3 Cans of beer per week    Comment: social  . Drug use: No  . Sexual activity: Not on file  Other Topics Concern  . Not on file  Social History Narrative   Lives with wife in [place].  Does not routinely exercise.   Social Determinants of Health   Financial Resource Strain: Not on file  Food Insecurity: Not on file  Transportation Needs: Not on file  Physical Activity: Not on file  Stress: Not on file  Social Connections: Not on file     Family History: The patient's family  history includes ADD / ADHD in his daughter; Colon polyps in his father; Diabetes in his brother, brother, brother, mother, and sister; Heart attack in his brother and brother; Heart attack (age of onset: 71) in his father; Heart disease in his brother, brother, and brother; Hyperlipidemia in his mother; Hypertension in his father, maternal grandmother, and mother; Obesity in his brother; Stroke in his father, mother, and sister. There is no history of Stomach cancer, Colon cancer, Esophageal cancer, or Rectal cancer.  ROS:   Please see the history of present illness.     All other systems reviewed and are negative.  EKGs/Labs/Other Studies Reviewed:    The following studies were reviewed today:   EKG:  EKG is  ordered today.  The ekg ordered today demonstrates sinus rhythm 70 bpm.  Recent Labs: 01/11/2020: ALT 15; BUN 16; Creat 1.19; Potassium 4.6; Sodium 138; TSH 1.44 04/26/2020: Hemoglobin 12.0; Platelet Count 192  Recent Lipid Panel    Component Value Date/Time   CHOL 163 01/11/2020 1543   TRIG 379 (H) 01/11/2020 1543   HDL 34 (L) 01/11/2020 1543   CHOLHDL 4.8 01/11/2020 1543   VLDL 29.6 05/03/2019 0755   LDLCALC 83 01/11/2020 1543   LDLDIRECT 85.0 11/17/2016 0928     Risk Assessment/Calculations:      Physical Exam:    VS:  BP 120/70 (BP Location: Left Arm, Patient Position: Sitting, Cuff Size: Normal)   Pulse 70   Ht 5\' 9"  (1.753 m)   Wt 262 lb (118.8 kg)   SpO2 96%   BMI 38.69 kg/m     Wt Readings from Last 3 Encounters:  07/05/20 262 lb (118.8 kg)  06/18/20 262 lb 6 oz (119 kg)  04/26/20 261 lb 0.6 oz (118.4 kg)     GEN:  Well nourished, well developed in no acute distress HEENT: Normal NECK: No JVD; No carotid bruits LYMPHATICS: No lymphadenopathy CARDIAC: RRR, no murmurs, rubs, gallops  RESPIRATORY:  Clear to auscultation without rales, wheezing or rhonchi  ABDOMEN: Soft, non-tender, non-distended MUSCULOSKELETAL:  No edema; No deformity  SKIN: Warm and  dry NEUROLOGIC:  Alert and oriented x 3 PSYCHIATRIC:  Normal affect   ASSESSMENT:    1. Coronary artery disease involving native coronary artery of native heart without angina pectoris   2. Palpitations   3. Hyperlipidemia, mixed    PLAN:    In order of problems listed above:  Coronary artery disease -RCA disease noted on prior catheterization continuing with good diabetes and hypertension control as well as Livalo statin.  Diabetes with hypertension difficult to control with palpitations -At prior visit we increased the metoprolol to 100 mg twice a day.  I also started him on losartan in the past.  Overall doing well.  EKG today shows sinus rhythm 70. -Perhaps next year we may consider pharmacologic stress test once again since last one in 2016 was several years ago.  However, he is currently feeling well.  Palpitations -Doing much better with the metoprolol increased to 100 twice a day.  Hyperlipidemia -On Livalo as well as fibrate.  Doing well.  LDL goal is less than 70.  At last check 83.  Continue with diet and exercise as well.  Morbid obesity -BMI 39 with 2 or more comorbidities.  Continue to encourage exercise.  Decrease carbohydrates.   Medication Adjustments/Labs and Tests Ordered: Current medicines are reviewed at length with the patient today.  Concerns regarding medicines are outlined above.  Orders Placed This Encounter  Procedures  . EKG 12-Lead   No orders of the defined types were placed in this encounter.   Patient Instructions  Medication Instructions:  The current medical regimen is effective;  continue present plan and medications.  *If you need a refill on your cardiac medications before your next appointment, please call your pharmacy*  Follow-Up: At [facility], you and your health needs are our priority.  As part of our continuing mission to provide you with exceptional heart care, we have created designated Provider Care Teams.  These Care  Teams include your primary Cardiologist (physician) and Advanced Practice Providers (APPs -  Physician Assistants and Nurse Practitioners) who all work together to provide you with the care you need, when you need it.  We recommend signing up for the patient portal called "MyChart".  Sign up information is provided on this After Visit Summary.  MyChart is used to connect with patients for Virtual Visits (Telemedicine).  Patients are able to view lab/test results, encounter notes, upcoming appointments, etc.  Non-urgent messages can be sent to your provider as well.   To learn more about what you can do with MyChart, go to [email or web address].    Your next appointment:   12 month(s)  The format for your next appointment:   In Person  Provider:   Candee Furbish, MD   Thank you for choosing [facility]!!         Signed, Candee Furbish, MD  07/05/2020 11:31 AM    [place]

## 2020-07-05 NOTE — Patient Instructions (Signed)
Medication Instructions:  The current medical regimen is effective;  continue present plan and medications.  *If you need a refill on your cardiac medications before your next appointment, please call your pharmacy*  Follow-Up: At [facility], you and your health needs are our priority.  As part of our continuing mission to provide you with exceptional heart care, we have created designated Provider Care Teams.  These Care Teams include your primary Cardiologist (physician) and Advanced Practice Providers (APPs -  Physician Assistants and Nurse Practitioners) who all work together to provide you with the care you need, when you need it.  We recommend signing up for the patient portal called "MyChart".  Sign up information is provided on this After Visit Summary.  MyChart is used to connect with patients for Virtual Visits (Telemedicine).  Patients are able to view lab/test results, encounter notes, upcoming appointments, etc.  Non-urgent messages can be sent to your provider as well.   To learn more about what you can do with MyChart, go to [email or web address].    Your next appointment:   12 month(s)  The format for your next appointment:   In Person  Provider:   Mark Skains, MD   Thank you for choosing [facility]!!      

## 2020-07-08 ENCOUNTER — Other Ambulatory Visit: Payer: Self-pay | Admitting: Family Medicine

## 2020-07-09 ENCOUNTER — Other Ambulatory Visit: Payer: Self-pay | Admitting: Cardiology

## 2020-07-10 ENCOUNTER — Other Ambulatory Visit: Payer: Self-pay | Admitting: Family Medicine

## 2020-08-01 DIAGNOSIS — R310 Gross hematuria: Secondary | ICD-10-CM | POA: Diagnosis not present

## 2020-08-05 DIAGNOSIS — G4730 Sleep apnea, unspecified: Secondary | ICD-10-CM | POA: Diagnosis not present

## 2020-08-05 DIAGNOSIS — G4733 Obstructive sleep apnea (adult) (pediatric): Secondary | ICD-10-CM | POA: Diagnosis not present

## 2020-08-23 ENCOUNTER — Inpatient Hospital Stay: Payer: Medicare Other | Attending: Hematology & Oncology

## 2020-08-23 ENCOUNTER — Inpatient Hospital Stay (HOSPITAL_BASED_OUTPATIENT_CLINIC_OR_DEPARTMENT_OTHER): Payer: Medicare Other | Admitting: Family

## 2020-08-23 ENCOUNTER — Encounter: Payer: Self-pay | Admitting: Family

## 2020-08-23 ENCOUNTER — Other Ambulatory Visit: Payer: Self-pay

## 2020-08-23 VITALS — BP 118/65 | HR 76 | Temp 98.5°F | Resp 17 | Ht 69.0 in | Wt 254.0 lb

## 2020-08-23 DIAGNOSIS — Z9884 Bariatric surgery status: Secondary | ICD-10-CM | POA: Insufficient documentation

## 2020-08-23 DIAGNOSIS — K922 Gastrointestinal hemorrhage, unspecified: Secondary | ICD-10-CM | POA: Insufficient documentation

## 2020-08-23 DIAGNOSIS — Z79899 Other long term (current) drug therapy: Secondary | ICD-10-CM | POA: Insufficient documentation

## 2020-08-23 DIAGNOSIS — K909 Intestinal malabsorption, unspecified: Secondary | ICD-10-CM | POA: Diagnosis not present

## 2020-08-23 DIAGNOSIS — D500 Iron deficiency anemia secondary to blood loss (chronic): Secondary | ICD-10-CM

## 2020-08-23 DIAGNOSIS — R319 Hematuria, unspecified: Secondary | ICD-10-CM | POA: Insufficient documentation

## 2020-08-23 LAB — CBC WITH DIFFERENTIAL (CANCER CENTER ONLY)
Abs Immature Granulocytes: 0.05 10*3/uL (ref 0.00–0.07)
Basophils Absolute: 0 10*3/uL (ref 0.0–0.1)
Basophils Relative: 1 %
Eosinophils Absolute: 0.2 10*3/uL (ref 0.0–0.5)
Eosinophils Relative: 3 %
HCT: 35 % — ABNORMAL LOW (ref 39.0–52.0)
Hemoglobin: 12 g/dL — ABNORMAL LOW (ref 13.0–17.0)
Immature Granulocytes: 1 %
Lymphocytes Relative: 24 %
Lymphs Abs: 1.2 10*3/uL (ref 0.7–4.0)
MCH: 30.1 pg (ref 26.0–34.0)
MCHC: 34.3 g/dL (ref 30.0–36.0)
MCV: 87.7 fL (ref 80.0–100.0)
Monocytes Absolute: 0.6 10*3/uL (ref 0.1–1.0)
Monocytes Relative: 12 %
Neutro Abs: 3 10*3/uL (ref 1.7–7.7)
Neutrophils Relative %: 59 %
Platelet Count: 239 10*3/uL (ref 150–400)
RBC: 3.99 MIL/uL — ABNORMAL LOW (ref 4.22–5.81)
RDW: 12.6 % (ref 11.5–15.5)
WBC Count: 5.1 10*3/uL (ref 4.0–10.5)
nRBC: 0 % (ref 0.0–0.2)

## 2020-08-23 LAB — FERRITIN: Ferritin: 176 ng/mL (ref 24–336)

## 2020-08-23 LAB — RETICULOCYTES
Immature Retic Fract: 14.6 % (ref 2.3–15.9)
RBC.: 4.03 MIL/uL — ABNORMAL LOW (ref 4.22–5.81)
Retic Count, Absolute: 113.6 10*3/uL (ref 19.0–186.0)
Retic Ct Pct: 2.8 % (ref 0.4–3.1)

## 2020-08-23 LAB — IRON AND TIBC
Iron: 81 ug/dL (ref 42–163)
Saturation Ratios: 23 % (ref 20–55)
TIBC: 360 ug/dL (ref 202–409)
UIBC: 279 ug/dL (ref 117–376)

## 2020-08-23 NOTE — Progress Notes (Signed)
Hematology and Oncology Follow Up Visit  Brent [name] [id number] 11/13/1955 65 y.o. 08/23/2020   Principle Diagnosis:  Iron deficiency anemia secondary tomalabsorptionand history ofacute GI bleed  Current Therapy: IV iron as indicated   Interim History:  Brent [name] is here today for follow-up. He is doing well and has no complaints at this time.  He did have blood noted in his urine with Urology and they have planned a cystoscopy and abdominal CT scan to follow-up. He has remained asymptomatic with this.  No other blood loss noted. No bruising or petechiae.  No fever, chills, n/v, cough, rash, dizziness, SOB, chest pain, palpitations, abdominal pain or changes in bowel or bladder habits.  No swelling, tenderness, numbness or tingling in his extremities.  No falls or syncope.  He has been eating smaller portions and staying well hydrated. His weight is down 8 lbs which he is happy about.   ECOG Performance Status: 1 - Symptomatic but completely ambulatory  Medications:  Allergies as of 08/23/2020      Reactions   Bee Venom Anaphylaxis   Lipitor [atorvastatin] Other (See Comments)   Myalgias, memory changes   Morphine Other (See Comments)   hyperactive   Hydrocodone Itching   Pravastatin Other (See Comments)   Made joints hurt. --also simvastatin       Medication List       Accurate as of Aug 23, 2020 10:18 AM. If you have any questions, ask your nurse or doctor.        amitriptyline 50 MG tablet Commonly known as: ELAVIL Take 1 tablet (50 mg total) by mouth at bedtime.   cetirizine 10 MG tablet Commonly known as: ZYRTEC Take 10 mg by mouth daily.   cyclobenzaprine 10 MG tablet Commonly known as: FLEXERIL TAKE ONE-HALF (1/2) TO ONE TABLET TWICE A DAY AS NEEDED FOR MUSCLE SPASMS   econazole nitrate 1 % cream Apply topically daily.   EPINEPHrine 0.3 mg/0.3 mL Soaj injection Commonly known as: EPI-PEN INJECT 0.3 MLS (0.3 MG TOTAL) INTO THE MUSCLE ONCE  FOR 1 DOSE   famotidine 40 MG tablet Commonly known as: PEPCID TAKE 1 TABLET AT BEDTIME AS NEEDED FOR HEARTBURN OR INDIGESTION   fenofibrate 160 MG tablet TAKE 1 TABLET DAILY   folic acid 1 MG tablet Commonly known as: FOLVITE Take 1 tablet (1 mg total) by mouth daily. What changed: how much to take   gabapentin 300 MG capsule Commonly known as: NEURONTIN 1 tab po bid and 2 tabs po qhs   glucose blood test strip Use as directed once daily to check blood sugar.  Diagnosis code E11.9   glyBURIDE 5 MG tablet Commonly known as: DIABETA TAKE 1 TABLET DAILY   hydrochlorothiazide 12.5 MG capsule Commonly known as: MICROZIDE TAKE 1 CAPSULE DAILY   IRON-C PO Take by mouth. Iron 65 mg and vit C-125 mg -take one daily   Janssen COVID-19 Vaccine 0.5 ML injection Generic drug: COVID-19 Ad26 vaccine (JANSSEN/J&J) INJECT AS DIRECTED   Livalo 2 MG Tabs Generic drug: Pitavastatin Calcium TAKE 1 TABLET DAILY   losartan 50 MG tablet Commonly known as: COZAAR TAKE 1 TABLET DAILY   Magnesium 100 MG Caps Take 1 capsule (100 mg total) by mouth daily.   metFORMIN 500 MG tablet Commonly known as: GLUCOPHAGE TAKE 1 TABLET FOUR TIMES A DAY   metoprolol tartrate 100 MG tablet Commonly known as: LOPRESSOR TAKE 1 TABLET TWICE A DAY   mometasone 0.1 % cream Commonly known as: Elocon  Apply 1 application topically daily.   multivitamin tablet Take 1 tablet by mouth daily. BARIATRIC VIT   oxycodone 5 MG capsule Commonly known as: OXY-IR Take 5 mg by mouth in the morning and at bedtime.   pantoprazole 40 MG tablet Commonly known as: PROTONIX TAKE 1 TABLET TWICE A DAY BEFORE MEALS   sodium chloride 0.65 % Soln nasal spray Commonly known as: OCEAN Place 1 spray into both nostrils as needed for congestion (nose irritation).   thiamine 100 MG tablet Take 1 tablet (100 mg total) by mouth daily.   Vitamin B-12 1000 MCG Subl Place 1 each under the tongue 2 (two) times a week.    Vitamin D (Ergocalciferol) 1.25 MG (50000 UNIT) Caps capsule Commonly known as: DRISDOL TAKE 1 CAPSULE EVERY 7 DAYS   VITAMIN D3 PO Take 5,000 Units by mouth daily.   VITAMIN E PO Take 1 tablet by mouth daily.       Allergies:  Allergies  Allergen Reactions  . Bee Venom Anaphylaxis  . Lipitor [Atorvastatin] Other (See Comments)    Myalgias, memory changes  . Morphine Other (See Comments)    hyperactive  . Hydrocodone Itching  . Pravastatin Other (See Comments)    Made joints hurt. --also simvastatin     Past Medical History, Surgical history, Social history, and Family History were reviewed and updated.  Review of Systems: All other 10 point review of systems is negative.   Physical Exam:  vitals were not taken for this visit.   Wt Readings from Last 3 Encounters:  07/05/20 262 lb (118.8 kg)  06/18/20 262 lb 6 oz (119 kg)  04/26/20 261 lb 0.6 oz (118.4 kg)    Ocular: Sclerae unicteric, pupils equal, round and reactive to light Ear-nose-throat: Oropharynx clear, dentition fair Lymphatic: No cervical or supraclavicular adenopathy Lungs no rales or rhonchi, good excursion bilaterally Heart regular rate and rhythm, no murmur appreciated Abd soft, nontender, positive bowel sounds MSK no focal spinal tenderness, no joint edema Neuro: non-focal, well-oriented, appropriate affect Breasts: Deferred   Lab Results  Component Value Date   WBC 5.1 04/26/2020   HGB 12.0 (L) 04/26/2020   HCT 37.4 (L) 04/26/2020   MCV 93.3 04/26/2020   PLT 192 04/26/2020   Lab Results  Component Value Date   FERRITIN 153 04/26/2020   IRON 93 04/26/2020   TIBC 353 04/26/2020   UIBC 259 04/26/2020   IRONPCTSAT 26 04/26/2020   Lab Results  Component Value Date   RETICCTPCT 2.9 04/26/2020   RBC 4.00 (L) 04/26/2020   RBC 4.01 (L) 04/26/2020   No results found for: KPAFRELGTCHN, LAMBDASER, KAPLAMBRATIO No results found for: IGGSERUM, IGA, IGMSERUM No results found for:  Odetta Pink, SPEI   Chemistry      Component Value Date/Time   NA 138 01/11/2020 1543   NA 137 07/18/2019 1454   K 4.6 01/11/2020 1543   CL 101 01/11/2020 1543   CO2 26 01/11/2020 1543   BUN 16 01/11/2020 1543   BUN 13 07/18/2019 1454   CREATININE 1.19 01/11/2020 1543      Component Value Date/Time   CALCIUM 9.3 01/11/2020 1543   ALKPHOS 33 (L) 05/03/2019 0755   AST 18 01/11/2020 1543   AST 15 07/14/2018 1021   ALT 15 01/11/2020 1543   ALT 11 07/14/2018 1021   BILITOT 0.5 01/11/2020 1543   BILITOT 0.4 07/14/2018 1021       Impression and Plan: Mr. Terpstra  is a very pleasant 65yo caucasian gentleman with iron deficiency anemia secondary to malabsorption (gastric bypass) as well intermittent GI blood loss with ulcer. Iron studies are pending. We will replace if needed.  Follow-up in 6 months.  He can contact our office with any questions or concerns.   Laverna Peace, NP 5/6/202210:18 AM

## 2020-09-04 DIAGNOSIS — K8080 Other cholelithiasis without obstruction: Secondary | ICD-10-CM | POA: Diagnosis not present

## 2020-09-04 DIAGNOSIS — G4730 Sleep apnea, unspecified: Secondary | ICD-10-CM | POA: Diagnosis not present

## 2020-09-04 DIAGNOSIS — G4733 Obstructive sleep apnea (adult) (pediatric): Secondary | ICD-10-CM | POA: Diagnosis not present

## 2020-09-04 DIAGNOSIS — R310 Gross hematuria: Secondary | ICD-10-CM | POA: Diagnosis not present

## 2020-09-04 DIAGNOSIS — K5750 Diverticulosis of both small and large intestine without perforation or abscess without bleeding: Secondary | ICD-10-CM | POA: Diagnosis not present

## 2020-09-04 DIAGNOSIS — K8020 Calculus of gallbladder without cholecystitis without obstruction: Secondary | ICD-10-CM | POA: Diagnosis not present

## 2020-09-05 ENCOUNTER — Other Ambulatory Visit: Payer: Self-pay

## 2020-09-05 ENCOUNTER — Emergency Department (INDEPENDENT_AMBULATORY_CARE_PROVIDER_SITE_OTHER)
Admission: EM | Admit: 2020-09-05 | Discharge: 2020-09-05 | Disposition: A | Discharge status: Home / Self Care | Payer: Medicare Other | Source: Home / Self Care

## 2020-09-05 ENCOUNTER — Telehealth: Payer: Medicare Other | Admitting: Family

## 2020-09-05 ENCOUNTER — Encounter: Payer: Self-pay | Admitting: Family Medicine

## 2020-09-05 DIAGNOSIS — M62830 Muscle spasm of back: Secondary | ICD-10-CM | POA: Diagnosis not present

## 2020-09-05 DIAGNOSIS — M5440 Lumbago with sciatica, unspecified side: Secondary | ICD-10-CM

## 2020-09-05 DIAGNOSIS — M545 Low back pain, unspecified: Secondary | ICD-10-CM

## 2020-09-05 DIAGNOSIS — R509 Fever, unspecified: Secondary | ICD-10-CM

## 2020-09-05 LAB — POCT URINALYSIS DIP (MANUAL ENTRY)
Bilirubin, UA: NEGATIVE
Blood, UA: NEGATIVE
Glucose, UA: 500 mg/dL — AB
Ketones, POC UA: NEGATIVE mg/dL
Leukocytes, UA: NEGATIVE
Nitrite, UA: NEGATIVE
Protein Ur, POC: NEGATIVE mg/dL
Spec Grav, UA: 1.015 (ref 1.010–1.025)
Urobilinogen, UA: 2 E.U./dL — AB
pH, UA: 5.5 (ref 5.0–8.0)

## 2020-09-05 MED ORDER — METHYLPREDNISOLONE 4 MG PO TBPK: ORAL_TABLET | ORAL | 0 refills | Status: DC

## 2020-09-05 MED ORDER — BACLOFEN 10 MG PO TABS: 10.0000 mg | ORAL_TABLET | Freq: Three times a day (TID) | ORAL | 0 refills | Status: DC

## 2020-09-05 NOTE — Progress Notes (Signed)
Based on what you shared with me, I feel your condition warrants further evaluation and I recommend that you be seen in a face to face office visit.  Given your current symptoms you need to be seen face to face today to rule out a more serious infection.    NOTE: If you entered your credit card information for this eVisit, you will not be charged. You may see a "hold" on your card for the $35 but that hold will drop off and you will not have a charge processed.   If you are having a true medical emergency please call 911.      For an urgent face to face visit, [facility] has six urgent care centers for your convenience:     [facility] Get Driving Directions [phone] [address] [place], [place] [address] . 8 am - 4 pm Monday - Friday    [facility]) Get Driving Directions [phone] [address] [place], [place] [address] . 8 am to 8 pm Monday-Friday . 10 am to 6 pm [facility] Get Driving Directions [phone]  [address] [place],  [place]  [address] . 8 am to 8 pm Monday-Friday . 8 am to 4 pm [facility] Get Driving Directions [phone] [place], [place], [place] [address] . 8 am to 8 pm Monday-Friday . 8 am to 4 pm [facility] at [facility] Get Driving Directions  [phone] [address] [place], [place] [address] . 8 am to 8 pm Monday-Friday . 8 am to 4 pm [facility] [place] Get Driving Directions [phone] [address], [place],  [address] . 8 am to 8 pm Monday-Friday . 8 am to 4 pm Saturday-Sunday     Your MyChart E-visit questionnaire answers were reviewed by a board certified advanced clinical practitioner to  complete your personal care plan based on your specific symptoms.  Thank you for using e-Visits.

## 2020-09-05 NOTE — ED Provider Notes (Signed)
Brent [name] CARE    CSN: [id number] Arrival date & time: 09/05/20  0905      History   Chief Complaint Chief Complaint  Patient presents with  . Flank Pain  . Fever    HPI Brent [name] is a 65 y.o. male.   HPI   65 year old male presents with left sided flank pain for 2 days.  Patient reports having CT scan of abdomen and pelvis yesterday which showed growth in his bladder, reports this growth is scheduled to be removed on 11/02/2020-earliest date for cystoscopy with urology.  Patient had x-ray of LS spine performed on 06/10/2019 which revealed mild facet arthropathy at L5-S1 with mild degenerative changes.  Past Medical History:  Diagnosis Date  . Acid reflux disease   . ACID REFLUX DISEASE 07/05/2007  . Acute gastric ulcer with bleeding   . Alcohol abuse   . Anemia   . Atherosclerosis   . Benign neoplasm of colon 07/05/2007  . Bilateral hip pain 10/05/2016  . Breast pain, left 11/17/2011  . CAD (coronary artery disease)   . Chest pain    a. Reportedly negative dobut echo performed prior to gastric bypass in 03/2011;  b. 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 12/2011 Cath: LM nl, LAD 50p, D1 59m, LCX min irregs, OM3 30, RCA 25p, 73m (FFR 0.99->0.89), PDA 30, EF 65%, Med Rx.  . COLONIC POLYPS, HX OF 10/29/2008  . Diarrhea 06/13/2010  . Diverticulosis 07/05/2018  . DIVERTICULOSIS, COLON 10/29/2008  . DM (diabetes mellitus), type 2, uncontrolled ([place])   . GI bleed   . Gout 03/04/2013  . Hearing loss 05/24/2013   Previous audiology evaluation completely.  [name] HTN (hypertension) 07/14/2010  . Hx of colonic polyps   . HYPERSOMNIA, ASSOCIATED WITH SLEEP APNEA 07/26/2008  . Hypertension 07/14/2010  . 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 Internal hemorrhoids   . Knee pain, left 10/10/2010  . Morbid obesity ([place]) 03/27/2010   a. 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  [name] Neck pain 03/2015  . Other and unspecified hyperlipidemia 11/16/2012  . Post-operative nausea and vomiting    after the  surgery in hospital in March 2020/ past the cauderization  . Preventative health care 11/17/2011  . Renal stone 11/2013  . Sleep apnea    a. CPAP  . Spinal stenosis   . Tear of meniscus of left knee 2012    Patient Active Problem List   Diagnosis Date Noted  . Dry eyes 05/14/2019  . Low back pain 09/05/2018  . Anemia due to chronic blood loss 08/22/2018  . Malabsorption of iron 07/18/2018  . IDA (iron deficiency anemia) 07/18/2018  . Marginal ulcer   . GI bleed 07/05/2018  . Hematochezia   . Insomnia 06/07/2018  . Shoulder pain 01/30/2018  . Bilateral hip pain 10/05/2016  . Ankle pain, right 09/14/2016  . Pain of both hip joints 09/14/2016  . Vitamin B12 deficiency 09/14/2016  . 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 Trapezius muscle spasm 04/02/2015  . Neck pain, bilateral 04/02/2015  . Headache(784.0) 11/26/2013  . Nephrolithiasis 09/27/2013  . Tinea corporis 09/27/2013  . Preventative health care 05/24/2013  . Hearing loss 05/24/2013  . Acute GI bleeding 03/28/2013  . Anemia 03/28/2013  . 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 Hx of adenomatous colonic polyps 03/23/2013  . Gout 03/04/2013  . Hyperlipidemia, mixed 11/16/2012  . Obstructive sleep apnea on CPAP 04/07/2012  . CAD (coronary artery disease) 01/12/2012  . Vitamin D  deficiency 10/14/2011  . Gastroesophageal reflux disease 04/01/2011  . Chronic pain of both knees 10/10/2010  . Essential hypertension 07/14/2010  . Morbid obesity ([place]) 03/27/2010  . DIVERTICULOSIS, COLON 10/29/2008  . T2DM (type 2 diabetes mellitus) ([place]) 07/05/2007    Past Surgical History:  Procedure Laterality Date  . ANKLE SURGERY Right 1994  . BICEPS TENDON REPAIR     left side  . CARDIAC CATHETERIZATION     denies any chest pain in the past 2 years  . COLONOSCOPY    . colonoscopy polyps    . ESOPHAGOGASTRODUODENOSCOPY N/A 03/27/2013   Procedure: ESOPHAGOGASTRODUODENOSCOPY (EGD);  Surgeon: Irene Shipper, MD;  [facility]: [facility];  Service: Endoscopy;  Laterality: N/A;  . ESOPHAGOGASTRODUODENOSCOPY (EGD) WITH PROPOFOL N/A 07/06/2018   Procedure: ESOPHAGOGASTRODUODENOSCOPY (EGD) WITH PROPOFOL;  Surgeon: Jerene Bears, MD;  Location: [facility] ENDOSCOPY;  Service: Gastroenterology;  Laterality: N/A;  . GASTRIC BYPASS    . HERNIA REPAIR    . HOT HEMOSTASIS N/A 07/06/2018   Procedure: HOT HEMOSTASIS (ARGON PLASMA COAGULATION/BICAP);  Surgeon: Jerene Bears, MD;  Location: [facility];  Service: Gastroenterology;  Laterality: N/A;  . KNEE ARTHROSCOPY Left 11/06/2010   Left, torn meniscus (repaired)  . LEFT HEART CATHETERIZATION WITH CORONARY ANGIOGRAM N/A 12/23/2011   Procedure: LEFT HEART CATHETERIZATION WITH CORONARY ANGIOGRAM;  Surgeon: Minus Breeding, MD;  Location: [facility] CATH LAB;  Service: Cardiovascular;  Laterality: N/A;  . REPLACEMENT TOTAL KNEE Right 01/2016   removed scar tissue/ cut tip of nerve bundle and re-route  . right knee arthroscopy Right 07/05/14   Dr. Hart Robinsons, [place].  [name] ROTATOR CUFF REPAIR  2019   left  . SCHLEROTHERAPY  07/06/2018   Procedure: Woodward Ku;  Surgeon: Jerene Bears, MD;  Location: [facility];  Service: Gastroenterology;;  . TONSILLECTOMY  age 41  . TONSILLECTOMY     as a child  . TOTAL KNEE ARTHROPLASTY Right 01/20/2016   Procedure: RIGHT TOTAL KNEE ARTHROPLASTY;  Surgeon: Paralee Cancel, MD;  Location: [facility] ORS;  Service: Orthopedics;  Laterality: Right;  . UPPER GI ENDOSCOPY  03/27/13       Home Medications    Prior to Admission medications   Medication Sig Start Date End Date Taking? Authorizing Provider  baclofen (LIORESAL) 10 MG tablet Take 1 tablet (10 mg total) by mouth 3 (three) times daily. 09/05/20  Yes Eliezer Lofts, FNP  methylPREDNISolone (MEDROL DOSEPAK) 4 MG TBPK tablet Take as directed 09/05/20  Yes Eliezer Lofts, FNP  amitriptyline (ELAVIL) 50 MG tablet Take 1 tablet (50 mg total) by mouth at bedtime. 07/08/20   [name], MD  cetirizine (ZYRTEC)  10 MG tablet Take 10 mg by mouth daily.    [provider]  Cholecalciferol (VITAMIN D3 PO) Take 5,000 Units by mouth daily.    [provider]  COVID-19 Ad26 vaccine, JANSSEN/J&J, 0.5 ML injection INJECT AS DIRECTED 03/18/20 03/18/21  Carlyle Basques, MD  Cyanocobalamin (VITAMIN B-12) 1000 MCG SUBL Place 1 each under the tongue 2 (two) times a week.     [provider]  econazole nitrate 1 % cream Apply topically daily. 09/24/18   [name], MD  EPINEPHRINE 0.3 mg/0.3 mL IJ SOAJ injection INJECT 0.3 MLS (0.3 MG TOTAL) INTO THE MUSCLE ONCE FOR 1 DOSE 04/04/19   [name], MD  famotidine (PEPCID) 40 MG tablet TAKE 1 TABLET AT BEDTIME AS NEEDED FOR HEARTBURN OR INDIGESTION 04/15/20   [name], MD  fenofibrate 160 MG  tablet TAKE 1 TABLET DAILY 01/01/20   [name], MD  Ferrous Gluconate-C-Folic Acid (IRON-C PO) Take by mouth. Iron 65 mg and vit C-125 mg -take one daily    [provider]  folic acid (FOLVITE) 1 MG tablet Take 1 tablet (1 mg total) by mouth daily. Patient taking differently: Take 2 mg by mouth daily. 07/09/18   Swayze, Ava, DO  gabapentin (NEURONTIN) 300 MG capsule 1 tab po bid and 2 tabs po qhs 05/09/19   [name] A, MD  glucose blood test strip Use as directed once daily to check blood sugar.  Diagnosis code E11.9 12/14/16   [name], MD  glyBURIDE (DIABETA) 5 MG tablet TAKE 1 TABLET DAILY 06/15/20   Shelda Pal, DO  hydrochlorothiazide (MICROZIDE) 12.5 MG capsule TAKE 1 CAPSULE DAILY 07/09/20   [name], MD  LIVALO 2 MG TABS TAKE 1 TABLET DAILY 07/01/20   [name], MD  losartan (COZAAR) 50 MG tablet TAKE 1 TABLET DAILY 06/13/20   Jerline Pain, MD  Magnesium 100 MG CAPS Take 1 capsule (100 mg total) by mouth daily. 06/29/17   [name], MD  metFORMIN (GLUCOPHAGE) 500 MG tablet TAKE 1 TABLET FOUR TIMES A DAY 02/02/20   [name], MD  metoprolol tartrate (LOPRESSOR) 100 MG tablet TAKE  1 TABLET TWICE A DAY 07/09/20   Jerline Pain, MD  mometasone (ELOCON) 0.1 % cream Apply 1 application topically daily. 01/25/19   [name], MD  Multiple Vitamin (MULTIVITAMIN) tablet Take 1 tablet by mouth daily. BARIATRIC VIT    [provider]  oxycodone (OXY-IR) 5 MG capsule Take 5 mg by mouth in the morning and at bedtime.    [provider]  pantoprazole (PROTONIX) 40 MG tablet TAKE 1 TABLET TWICE A DAY BEFORE MEALS 09/19/19   [name], MD  sodium chloride (OCEAN) 0.65 % SOLN nasal spray Place 1 spray into both nostrils as needed for congestion (nose irritation). 07/08/18   Swayze, Ava, DO  thiamine 100 MG tablet Take 1 tablet (100 mg total) by mouth daily. 07/09/18   Swayze, Ava, DO  Vitamin D, Ergocalciferol, (DRISDOL) 1.25 MG (50000 UNIT) CAPS capsule TAKE 1 CAPSULE EVERY 7 DAYS 07/25/19   [name], MD  VITAMIN E PO Take 1 tablet by mouth daily.    [provider]    Family History Family History  Problem Relation Age of Onset  . Diabetes Mother   . Hypertension Mother   . Stroke Mother   . Hyperlipidemia Mother   . Hypertension Father   . Colon polyps Father   . Heart attack Father 80  . Stroke Father   . Heart attack Brother   . Diabetes Brother   . Heart disease Brother   . Heart attack Brother        Multiple  . Diabetes Brother   . Diabetes Sister   . Stroke Sister   . Obesity Brother   . Diabetes Brother   . Heart disease Brother   . Hypertension Maternal Grandmother   . ADD / ADHD Daughter   . Heart disease Brother   . Stomach cancer Neg Hx   . Colon cancer Neg Hx   . Esophageal cancer Neg Hx   . Rectal cancer Neg Hx     Social History Social History   Tobacco Use  . Smoking status: Former Smoker    Packs/day: 1.50  Years: 20.00    Pack years: 30.00    Types: Cigarettes    Quit date: 04/21/1991    Years since quitting: 29.3  . Smokeless tobacco: Never Used  Vaping Use  . Vaping Use: Never used  Substance  Use Topics  . Alcohol use: Yes    Alcohol/week: 3.0 standard drinks    Types: 3 Cans of beer per week    Comment: weekly  . Drug use: No     Allergies   Bee venom, Lipitor [atorvastatin], Morphine, Hydrocodone, and Pravastatin   Review of Systems Review of Systems  Constitutional: Negative.   HENT: Negative.   Eyes: Negative.   Respiratory: Negative.   Cardiovascular: Negative.   Gastrointestinal: Negative.   Genitourinary: Negative.   Musculoskeletal: Positive for back pain.  Skin: Negative.   Neurological: Negative.      Physical Exam Triage Vital Signs ED Triage Vitals  Enc Vitals Group     BP 09/05/20 1017 114/68     Pulse Rate 09/05/20 1017 77     Resp 09/05/20 1017 20     Temp 09/05/20 1017 98.7 F (37.1 C)     Temp Source 09/05/20 1017 Oral     SpO2 09/05/20 1017 98 %     Weight 09/05/20 1011 248 lb (112.5 kg)     Height 09/05/20 1011 5\' 8"  (1.727 m)     Head Circumference --      Peak Flow --      Pain Score 09/05/20 1011 3     Pain Loc --      Pain Edu? --      Excl. in [place]? --    No data found.  Updated Vital Signs BP 114/68   Pulse 77   Temp 98.7 F (37.1 C) (Oral)   Resp 20   Ht 5\' 8"  (1.727 m)   Wt 248 lb (112.5 kg)   SpO2 98%   BMI 37.71 kg/m   Visual Acuity Right Eye Distance:   Left Eye Distance:   Bilateral Distance:    Right Eye Near:   Left Eye Near:    Bilateral Near:     Physical Exam Constitutional:      General: He is not in acute distress.    Appearance: Normal appearance. He is obese. He is not ill-appearing.  HENT:     Head: Normocephalic and atraumatic.     Mouth/Throat:     Mouth: Mucous membranes are moist.     Pharynx: Oropharynx is clear.  Eyes:     Extraocular Movements: Extraocular movements intact.     Conjunctiva/sclera: Conjunctivae normal.     Pupils: Pupils are equal, round, and reactive to light.  Cardiovascular:     Rate and Rhythm: Normal rate and regular rhythm.     Pulses: Normal pulses.      Heart sounds: Normal heart sounds.  Pulmonary:     Effort: Pulmonary effort is normal.     Breath sounds: Normal breath sounds.     Comments: No adventitious breath sounds Musculoskeletal:     Cervical back: Normal range of motion and neck supple. No tenderness.     Comments: LS-spine (left-sided): TTP over spinous processes, paraspinous muscles, and central superior spinal erectors  Lymphadenopathy:     Cervical: No cervical adenopathy.  Skin:    General: Skin is warm and dry.  Neurological:     General: No focal deficit present.     Mental Status: He is alert and oriented to  person, place, and time.  Psychiatric:        Mood and Affect: Mood normal.        Behavior: Behavior normal.      UC Treatments / Results  Labs (all labs ordered are listed, but only abnormal results are displayed) Labs Reviewed  POCT URINALYSIS DIP (MANUAL ENTRY) - Abnormal; Notable for the following components:      Result Value   Color, UA straw (*)    Glucose, UA =500 (*)    Urobilinogen, UA 2.0 (*)    All other components within normal limits    EKG   Radiology No results found.  Procedures Procedures (including critical care time)  Medications Ordered in UC Medications - No data to display  Initial Impression / Assessment and Plan / UC Course  I have reviewed the triage vital signs and the nursing notes.  Pertinent labs & imaging results that were available during my care of the patient were reviewed by me and considered in my medical decision making (see chart for details).     MDM: 1.  Lumbago, 2.  Muscle spasms of back.  Patient discharged home, hemodynamically stable. Final Clinical Impressions(s) / UC Diagnoses   Final diagnoses:  Midline low back pain with sciatica, sciatica laterality unspecified, unspecified chronicity  Muscle spasm of back     Discharge Instructions     Advised patient to take medication with food to completion.  Encouraged patient to increase  daily water intake while taking this medication.  May use baclofen daily, as needed.    ED Prescriptions    Medication Sig Dispense Auth. Provider   methylPREDNISolone (MEDROL DOSEPAK) 4 MG TBPK tablet Take as directed 1 each Eliezer Lofts, FNP   baclofen (LIORESAL) 10 MG tablet Take 1 tablet (10 mg total) by mouth 3 (three) times daily. 81 each Eliezer Lofts, FNP     PDMP not reviewed this encounter.   Eliezer Lofts, [place] 09/05/20 1120

## 2020-09-05 NOTE — Discharge Instructions (Signed)
Advised patient to take medication with food to completion.  Encouraged patient to increase daily water intake while taking this medication.  May use baclofen daily, as needed.

## 2020-09-05 NOTE — ED Triage Notes (Signed)
Pt presents to Urgent Care with c/o fever and L flank pain x 2 days. Reports having a CT scan yesterday which showed a growth in his bladder that is scheduled to be removed 11/02/20.

## 2020-09-06 ENCOUNTER — Telehealth: Payer: Self-pay | Admitting: Family Medicine

## 2020-09-06 NOTE — Telephone Encounter (Signed)
Pt is aware of advice °

## 2020-09-06 NOTE — Telephone Encounter (Signed)
Pt was called.

## 2020-09-06 NOTE — Telephone Encounter (Signed)
Pt, would like a respond to his message on my chart

## 2020-09-09 ENCOUNTER — Encounter: Payer: Self-pay | Admitting: *Deleted

## 2020-09-09 ENCOUNTER — Telehealth: Payer: Self-pay

## 2020-09-09 MED ORDER — INSULIN LISPRO (1 UNIT DIAL) 100 UNIT/ML (KWIKPEN): PEN_INJECTOR | SUBCUTANEOUS | 0 refills | Status: DC

## 2020-09-09 MED ORDER — "PEN NEEDLES 5/16"" 31G X 8 MM MISC": 0 refills | Status: DC

## 2020-09-09 NOTE — Telephone Encounter (Signed)
Spoke with daughter and wife rx sent in for Humalog with sliding scale directions. (Check BS tid with meals and qhs 0-200 no units, 201-250 2 units, 251-300 4 units, 301-350 6 units, 351-400 8 units, 401-450 10 units, call MD >450) along with pen needles.  They will call with status update tomorrow.

## 2020-09-09 NOTE — Telephone Encounter (Signed)
I had call back because I had left out some information and wife picked up. She asked if you looked at his urine from the urgent care and the glucose in it (it was =500).  She also wants to know if patient should follow up sooner than 10/01/20? He is feeling very fatigue (advised that when sugars are that high that it will make him feel that way).

## 2020-09-09 NOTE — Telephone Encounter (Signed)
Agree sugar that high causes notable fatigue as his sugars improve his fatigue will improve. They should keep [place] posted on his progress and as his sugars improve so should his fatigue. If not then we can consider a visit.

## 2020-09-09 NOTE — Telephone Encounter (Signed)
did Brent [name] double his metformin? I believe he has used insulin in past if he is willing we can send him in Humalog or Novolog what ever his insurance will cover as a sliding scale to use. please check with him.

## 2020-09-09 NOTE — Telephone Encounter (Signed)
He did double up on the medication.

## 2020-09-09 NOTE — Telephone Encounter (Signed)
Pt's wife called in states that husband is feeling bad. She states that his been in the 300's, 400's, and saying error. Also he stop taking the prednisone Saturday and still not feeling any better.

## 2020-09-09 NOTE — Telephone Encounter (Signed)
Pt's wife has called again and is not happy that no one has returned her call. I advised her that PCP has been in patient care all morning and call will be returned once PCP has a chance to address concern.  She stated this is the same issue they dealt with on Friday and no one called them back and she stated pt's BS readings are 600 and will have to go to the ER if not addressed.

## 2020-09-10 ENCOUNTER — Other Ambulatory Visit: Payer: Self-pay | Admitting: *Deleted

## 2020-09-10 MED ORDER — FREESTYLE LITE TEST VI STRP: ORAL_STRIP | 1 refills | Status: DC

## 2020-09-10 NOTE — Telephone Encounter (Signed)
Spoke with patient and advised him of note from Dr. Charlett Blake.

## 2020-09-10 NOTE — Telephone Encounter (Signed)
Spoke with wife yesterday his sugar readings was 530pm was 325, 12 midnight after eating was 425.  This morning fasting sugar was 279 and before meal was 295 (insulin given).  Patient still feeling tired.   Wife would like to know if patient should be on glyburide, they had this on had from last steroid injection and had not taken it since?  Also did not take when Dr. Nani Ravens advised to Inda [place] spoke with pt last week).  Also urgent care provider took him of cyclobenzaprine and put pt on baclofen instead, wife wants to know if pt should go back on cyclobenzaprine.  Lastly patient has been taking metformin and wife was not sure how often he was taking it as the pt was sleep at time of call. She wants to know if she be on this and how often.  Patient has a cystoscope scheduled for 11am at [facility] urology on Thursday.    Wife asked for test strips(Freestyle Lite) to be [facility].  Rx sent in.

## 2020-09-10 NOTE — Telephone Encounter (Signed)
See previous note

## 2020-09-10 NOTE — Telephone Encounter (Signed)
Metformin is supposed to be 500 mg qid, as long as we are having trouble with his sugar being high he can take both Glyburide and Metformin and monitor sugars closely and only use the SS insulin as directed. He can take Cyclobenzaprine or Baclofen whichever works best for him with least side effects clarify which one works for him best and update

## 2020-09-12 ENCOUNTER — Telehealth: Payer: Self-pay | Admitting: *Deleted

## 2020-09-12 DIAGNOSIS — D494 Neoplasm of unspecified behavior of bladder: Secondary | ICD-10-CM | POA: Diagnosis not present

## 2020-09-12 NOTE — Telephone Encounter (Signed)
Spoke with patient to check status today.  He gave a few numbers (no times).  5/23-402 5/24-5/25- 502,774,128, 210, 260, 253, 164 5/26- 247, 231, 237  He still feels tired.  Overall wife states he looks much better and she is not as worried as before.  Today he had cystoscope and found a 2cm cauliflower looking lesion(?).  They are going to get him set up to try and shave it off and biopsy and possible chemo to keep it from coming back or growing.

## 2020-09-12 NOTE — Telephone Encounter (Signed)
Have him continue Sliding scale as he recovers form the cystoscopy through the weekend and let us know numbers next week

## 2020-09-13 NOTE — Telephone Encounter (Signed)
Patient notified and will call on Tuesday with more sugar readings

## 2020-09-18 ENCOUNTER — Telehealth: Payer: Self-pay | Admitting: *Deleted

## 2020-09-18 ENCOUNTER — Other Ambulatory Visit: Payer: Self-pay | Admitting: Family Medicine

## 2020-09-18 NOTE — Telephone Encounter (Signed)
Thanks for following up with him and good work helping him with this. We can discuss further adjustments at his appointment in 2 weeks.

## 2020-09-18 NOTE — Telephone Encounter (Signed)
Called to check patient status.  His blood sugars are now running between 150s-190s and his fatigue is getting better.

## 2020-09-18 NOTE — Telephone Encounter (Signed)
Patient notified

## 2020-09-20 ENCOUNTER — Other Ambulatory Visit: Payer: Self-pay | Admitting: Urology

## 2020-09-20 ENCOUNTER — Encounter: Payer: Self-pay | Admitting: Family

## 2020-09-23 ENCOUNTER — Inpatient Hospital Stay: Payer: Medicare Other | Attending: Hematology & Oncology

## 2020-09-23 ENCOUNTER — Other Ambulatory Visit: Payer: Self-pay

## 2020-09-23 VITALS — BP 106/61 | HR 70 | Temp 97.8°F | Resp 17

## 2020-09-23 DIAGNOSIS — M5416 Radiculopathy, lumbar region: Secondary | ICD-10-CM | POA: Diagnosis not present

## 2020-09-23 DIAGNOSIS — K909 Intestinal malabsorption, unspecified: Secondary | ICD-10-CM

## 2020-09-23 DIAGNOSIS — D500 Iron deficiency anemia secondary to blood loss (chronic): Secondary | ICD-10-CM | POA: Diagnosis not present

## 2020-09-23 MED ORDER — SODIUM CHLORIDE 0.9 % IV SOLN
Freq: Once | INTRAVENOUS | Status: AC
Start: 2020-09-23 — End: 2020-09-23
  Filled 2020-09-23: qty 250

## 2020-09-23 MED ORDER — SODIUM CHLORIDE 0.9 % IV SOLN
125.0000 mg | Freq: Once | INTRAVENOUS | Status: AC
  Administered 2020-09-23: 125 mg via INTRAVENOUS
  Filled 2020-09-23: qty 10

## 2020-09-23 NOTE — Progress Notes (Signed)
Pt aware to arrive at [facility] admitting at 10:45 am for scheduled surgical procedure. Reviewed medication pt can take am of surgery with sip of water. Otherwise no food or drink after midnight.

## 2020-09-23 NOTE — Progress Notes (Addendum)
Attempted to do pre op via telephone twice. Both times patient was at a doctors' appointment.  The following information is based off of chart review only:  COVID Vaccine Completed:Yes Date COVID Vaccine completed: 07/01/19 Has received booster: 03/18/20 COVID vaccine manufacturer:  [employer]  Date of COVID positive in last 90 days:  PCP - [name], MD Cardiologist - Dr. Candee Furbish last office visit 07/05/2020 in epic Pulmonologist-Adewale Olalere MD last office visit 02/22/2020 in epic Oncologist-[place], Holli Humbles, NP  Chest x-ray - greater than 1 year EKG - 07/05/20 ion epic Stress Test - greater than 2 years in epic ECHO - greater than 2 years in epic Cardiac Cath -  greater than 2 years in epic Pacemaker/ICD device last checked:  Sleep Study - 03/27/2020 in epic CPAP -   Fasting Blood Sugar -  Checks Blood Sugar _____ times a day  Blood Thinner Instructions: Aspirin Instructions: Last Dose:  Activity level:  Unable to go up a flight of stairs without symptoms   Can go up a flight of stairs and activities of daily living without stopping and without symptoms   Able to exercise without symptoms     Anesthesia review: Coronary Artery Disease, Diabetes, Sleep Apnea, Hypertension, Morbid Obesity  Patient denies shortness of breath, fever, cough and chest pain at PAT appointment   Patient verbalized understanding of instructions that were given to them at the PAT appointment. Patient was also instructed that they will need to review over the PAT instructions again at home before surgery.

## 2020-09-23 NOTE — Patient Instructions (Signed)
Sodium Ferric Gluconate Complex injection What is this medicine? SODIUM FERRIC GLUCONATE COMPLEX (SOE dee um FER ik GLOO koe nate KOM pleks) is an iron replacement. It is used with epoetin therapy to treat low iron levels in patients who are receiving hemodialysis. This medicine may be used for other purposes; ask your health care provider or pharmacist if you have questions. COMMON BRAND NAME(S): Ferrlecit, Nulecit What should I tell my health care provider before I take this medicine? They need to know if you have any of the following conditions:  anemia that is not from iron deficiency  high levels of iron in the body  an unusual or allergic reaction to iron, benzyl alcohol, other medicines, foods, dyes, or preservatives  pregnant or are trying to become pregnant  breast-feeding How should I use this medicine? This medicine is for infusion into a vein. It is given by a health care professional in a hospital or clinic setting. Talk to your pediatrician regarding the use of this medicine in children. While this drug may be prescribed for children as young as 6 years old for selected conditions, precautions do apply. Overdosage: If you think you have taken too much of this medicine contact a poison control center or emergency room at once. NOTE: This medicine is only for you. Do not share this medicine with others. What if I miss a dose? It is important not to miss your dose. Call your doctor or health care professional if you are unable to keep an appointment. What may interact with this medicine? Do not take this medicine with any of the following medications:  deferoxamine  dimercaprol  other iron products This medicine may also interact with the following medications:  chloramphenicol  deferasirox  medicine for blood pressure like enalapril This list may not describe all possible interactions. Give your health care provider a list of all the medicines, herbs,  non-prescription drugs, or dietary supplements you use. Also tell them if you smoke, drink alcohol, or use illegal drugs. Some items may interact with your medicine. What should I watch for while using this medicine? Your condition will be monitored carefully while you are receiving this medicine. Visit your doctor for check-ups as directed. What side effects may I notice from receiving this medicine? Side effects that you should report to your doctor or health care professional as soon as possible:  allergic reactions like skin rash, itching or hives, swelling of the face, lips, or tongue  breathing problems  changes in hearing  changes in vision  chills, flushing, or sweating  fast, irregular heartbeat  feeling faint or lightheaded, falls  fever, flu-like symptoms  high or low blood pressure  pain, tingling, numbness in the hands or feet  severe pain in the chest, back, flanks, or groin  swelling of the ankles, feet, hands  trouble passing urine or change in the amount of urine  unusually weak or tired Side effects that usually do not require medical attention (report to your doctor or health care professional if they continue or are bothersome):  cramps  dark colored stools  diarrhea  headache  nausea, vomiting  stomach upset This list may not describe all possible side effects. Call your doctor for medical advice about side effects. You may report side effects to FDA at 1-800-FDA-1088. Where should I keep my medicine? This drug is given in a hospital or clinic and will not be stored at home. NOTE: This sheet is a summary. It may not cover all   possible information. If you have questions about this medicine, talk to your doctor, pharmacist, or health care provider.  2021 Elsevier/Gold Standard (2007-12-07 15:58:57)   

## 2020-09-24 ENCOUNTER — Encounter (HOSPITAL_COMMUNITY)
Admission: RE | Disposition: A | Discharge status: Home / Self Care | Payer: Self-pay | Source: Home / Self Care | Attending: Urology

## 2020-09-24 ENCOUNTER — Ambulatory Visit (HOSPITAL_COMMUNITY)
Admission: RE | Admit: 2020-09-24 | Discharge: 2020-09-24 | Disposition: A | Discharge status: Home / Self Care | Payer: Medicare Other | Attending: Urology | Admitting: Urology

## 2020-09-24 ENCOUNTER — Ambulatory Visit (HOSPITAL_COMMUNITY): Payer: Medicare Other | Admitting: Physician Assistant

## 2020-09-24 ENCOUNTER — Ambulatory Visit (HOSPITAL_COMMUNITY): Payer: Medicare Other

## 2020-09-24 ENCOUNTER — Other Ambulatory Visit: Payer: Self-pay

## 2020-09-24 DIAGNOSIS — Z8249 Family history of ischemic heart disease and other diseases of the circulatory system: Secondary | ICD-10-CM | POA: Insufficient documentation

## 2020-09-24 DIAGNOSIS — C679 Malignant neoplasm of bladder, unspecified: Secondary | ICD-10-CM | POA: Diagnosis not present

## 2020-09-24 DIAGNOSIS — Z7984 Long term (current) use of oral hypoglycemic drugs: Secondary | ICD-10-CM | POA: Insufficient documentation

## 2020-09-24 DIAGNOSIS — Z8349 Family history of other endocrine, nutritional and metabolic diseases: Secondary | ICD-10-CM | POA: Diagnosis not present

## 2020-09-24 DIAGNOSIS — Z87891 Personal history of nicotine dependence: Secondary | ICD-10-CM | POA: Diagnosis not present

## 2020-09-24 DIAGNOSIS — Z823 Family history of stroke: Secondary | ICD-10-CM | POA: Diagnosis not present

## 2020-09-24 DIAGNOSIS — Z841 Family history of disorders of kidney and ureter: Secondary | ICD-10-CM | POA: Diagnosis not present

## 2020-09-24 DIAGNOSIS — K219 Gastro-esophageal reflux disease without esophagitis: Secondary | ICD-10-CM | POA: Insufficient documentation

## 2020-09-24 DIAGNOSIS — D090 Carcinoma in situ of bladder: Secondary | ICD-10-CM | POA: Diagnosis not present

## 2020-09-24 DIAGNOSIS — D494 Neoplasm of unspecified behavior of bladder: Secondary | ICD-10-CM | POA: Diagnosis not present

## 2020-09-24 DIAGNOSIS — D414 Neoplasm of uncertain behavior of bladder: Secondary | ICD-10-CM

## 2020-09-24 DIAGNOSIS — C672 Malignant neoplasm of lateral wall of bladder: Secondary | ICD-10-CM | POA: Diagnosis not present

## 2020-09-24 DIAGNOSIS — Z87442 Personal history of urinary calculi: Secondary | ICD-10-CM | POA: Insufficient documentation

## 2020-09-24 DIAGNOSIS — Z833 Family history of diabetes mellitus: Secondary | ICD-10-CM | POA: Diagnosis not present

## 2020-09-24 DIAGNOSIS — I10 Essential (primary) hypertension: Secondary | ICD-10-CM | POA: Insufficient documentation

## 2020-09-24 DIAGNOSIS — I2510 Atherosclerotic heart disease of native coronary artery without angina pectoris: Secondary | ICD-10-CM | POA: Diagnosis not present

## 2020-09-24 DIAGNOSIS — Z888 Allergy status to other drugs, medicaments and biological substances status: Secondary | ICD-10-CM | POA: Diagnosis not present

## 2020-09-24 DIAGNOSIS — E119 Type 2 diabetes mellitus without complications: Secondary | ICD-10-CM | POA: Insufficient documentation

## 2020-09-24 DIAGNOSIS — Z79899 Other long term (current) drug therapy: Secondary | ICD-10-CM | POA: Insufficient documentation

## 2020-09-24 DIAGNOSIS — E782 Mixed hyperlipidemia: Secondary | ICD-10-CM | POA: Diagnosis not present

## 2020-09-24 DIAGNOSIS — Z7951 Long term (current) use of inhaled steroids: Secondary | ICD-10-CM | POA: Diagnosis not present

## 2020-09-24 HISTORY — PX: TRANSURETHRAL RESECTION OF BLADDER TUMOR: SHX2575

## 2020-09-24 LAB — CBC
HCT: 31.7 % — ABNORMAL LOW (ref 39.0–52.0)
Hemoglobin: 9.9 g/dL — ABNORMAL LOW (ref 13.0–17.0)
MCH: 29 pg (ref 26.0–34.0)
MCHC: 31.2 g/dL (ref 30.0–36.0)
MCV: 93 fL (ref 80.0–100.0)
Platelets: 209 K/uL (ref 150–400)
RBC: 3.41 MIL/uL — ABNORMAL LOW (ref 4.22–5.81)
RDW: 14.8 % (ref 11.5–15.5)
WBC: 4.1 K/uL (ref 4.0–10.5)
nRBC: 0 % (ref 0.0–0.2)

## 2020-09-24 LAB — COMPREHENSIVE METABOLIC PANEL
ALT: 31 U/L (ref 0–44)
AST: 40 U/L (ref 15–41)
Albumin: 4.1 g/dL (ref 3.5–5.0)
Alkaline Phosphatase: 121 U/L (ref 38–126)
Anion gap: 8 (ref 5–15)
BUN: 15 mg/dL (ref 8–23)
CO2: 25 mmol/L (ref 22–32)
Calcium: 9 mg/dL (ref 8.9–10.3)
Chloride: 103 mmol/L (ref 98–111)
Creatinine, Ser: 1.02 mg/dL (ref 0.61–1.24)
GFR, Estimated: >60 mL/min (ref >60)
Glucose, Bld: 227 mg/dL — ABNORMAL HIGH (ref 70–99)
Potassium: 4.2 mmol/L (ref 3.5–5.1)
Sodium: 136 mmol/L (ref 135–145)
Total Bilirubin: 0.9 mg/dL (ref 0.3–1.2)
Total Protein: 7.1 g/dL (ref 6.5–8.1)

## 2020-09-24 LAB — GLUCOSE, CAPILLARY
Glucose-Capillary: 213 mg/dL — ABNORMAL HIGH (ref 70–99)
Glucose-Capillary: 203 mg/dL — ABNORMAL HIGH (ref 70–99)

## 2020-09-24 SURGERY — TURBT (TRANSURETHRAL RESECTION OF BLADDER TUMOR)
Anesthesia: General | Site: Bladder

## 2020-09-24 MED ORDER — FENTANYL CITRATE (PF) 100 MCG/2ML IJ SOLN
INTRAMUSCULAR | Status: AC
  Filled 2020-09-24: qty 2

## 2020-09-24 MED ORDER — LIDOCAINE HCL (PF) 2 % IJ SOLN
INTRAMUSCULAR | Status: AC
  Filled 2020-09-24: qty 5

## 2020-09-24 MED ORDER — SODIUM CHLORIDE 0.9 % IR SOLN
Status: DC | PRN
  Administered 2020-09-24: 1000 mL

## 2020-09-24 MED ORDER — CHLORHEXIDINE GLUCONATE 0.12 % MT SOLN
15.0000 mL | Freq: Once | OROMUCOSAL | Status: AC
  Administered 2020-09-24: 15 mL via OROMUCOSAL

## 2020-09-24 MED ORDER — ROCURONIUM BROMIDE 10 MG/ML (PF) SYRINGE
PREFILLED_SYRINGE | INTRAVENOUS | Status: AC
  Filled 2020-09-24: qty 10

## 2020-09-24 MED ORDER — SUGAMMADEX SODIUM 500 MG/5ML IV SOLN
INTRAVENOUS | Status: AC
  Filled 2020-09-24: qty 5

## 2020-09-24 MED ORDER — DEXAMETHASONE SODIUM PHOSPHATE 10 MG/ML IJ SOLN
INTRAMUSCULAR | Status: DC | PRN
  Administered 2020-09-24: 5 mg via INTRAVENOUS

## 2020-09-24 MED ORDER — PROPOFOL 10 MG/ML IV BOLUS
INTRAVENOUS | Status: DC | PRN
  Administered 2020-09-24: 170 mg via INTRAVENOUS

## 2020-09-24 MED ORDER — ONDANSETRON HCL 4 MG/2ML IJ SOLN
INTRAMUSCULAR | Status: DC | PRN
  Administered 2020-09-24 (×2): 4 mg via INTRAVENOUS

## 2020-09-24 MED ORDER — SODIUM CHLORIDE 0.9 % IR SOLN
Status: DC | PRN
  Administered 2020-09-24: 12000 mL

## 2020-09-24 MED ORDER — BELLADONNA ALKALOIDS-OPIUM 16.2-60 MG RE SUPP
RECTAL | Status: DC | PRN
  Administered 2020-09-24: 1 via RECTAL

## 2020-09-24 MED ORDER — OXYCODONE HCL 5 MG PO TABS
ORAL_TABLET | ORAL | Status: AC
  Administered 2020-09-24: 5 mg via ORAL
  Filled 2020-09-24: qty 1

## 2020-09-24 MED ORDER — FENTANYL CITRATE (PF) 100 MCG/2ML IJ SOLN
INTRAMUSCULAR | Status: DC | PRN
  Administered 2020-09-24 (×4): 50 ug via INTRAVENOUS

## 2020-09-24 MED ORDER — GEMCITABINE CHEMO FOR BLADDER INSTILLATION 2000 MG
2000.0000 mg | Freq: Once | INTRAVENOUS | Status: AC
  Administered 2020-09-24: 2000 mg via INTRAVESICAL
  Filled 2020-09-24: qty 2000

## 2020-09-24 MED ORDER — SUCCINYLCHOLINE CHLORIDE 200 MG/10ML IV SOSY
PREFILLED_SYRINGE | INTRAVENOUS | Status: AC
  Filled 2020-09-24: qty 10

## 2020-09-24 MED ORDER — ONDANSETRON HCL 4 MG/2ML IJ SOLN: 4.0000 mg | Freq: Once | INTRAMUSCULAR | Status: DC | PRN

## 2020-09-24 MED ORDER — OXYCODONE HCL 5 MG PO TABS: 5.0000 mg | ORAL_TABLET | Freq: Once | ORAL | Status: AC | PRN

## 2020-09-24 MED ORDER — MIDAZOLAM HCL 2 MG/2ML IJ SOLN
INTRAMUSCULAR | Status: AC
  Filled 2020-09-24: qty 2

## 2020-09-24 MED ORDER — DEXAMETHASONE SODIUM PHOSPHATE 10 MG/ML IJ SOLN
INTRAMUSCULAR | Status: AC
  Filled 2020-09-24: qty 1

## 2020-09-24 MED ORDER — LIDOCAINE HCL URETHRAL/MUCOSAL 2 % EX GEL
CUTANEOUS | Status: AC
  Filled 2020-09-24: qty 30

## 2020-09-24 MED ORDER — SUGAMMADEX SODIUM 200 MG/2ML IV SOLN
INTRAVENOUS | Status: DC | PRN
  Administered 2020-09-24: 200 mg via INTRAVENOUS

## 2020-09-24 MED ORDER — BELLADONNA ALKALOIDS-OPIUM 16.2-30 MG RE SUPP
RECTAL | Status: AC
  Filled 2020-09-24: qty 1

## 2020-09-24 MED ORDER — SUCCINYLCHOLINE CHLORIDE 20 MG/ML IJ SOLN
INTRAMUSCULAR | Status: DC | PRN
  Administered 2020-09-24: 160 mg via INTRAVENOUS

## 2020-09-24 MED ORDER — ONDANSETRON HCL 4 MG/2ML IJ SOLN
INTRAMUSCULAR | Status: AC
  Filled 2020-09-24: qty 2

## 2020-09-24 MED ORDER — KETOROLAC TROMETHAMINE 30 MG/ML IJ SOLN: 30.0000 mg | Freq: Once | INTRAMUSCULAR | Status: DC | PRN

## 2020-09-24 MED ORDER — DEXMEDETOMIDINE (PRECEDEX) IN NS 20 MCG/5ML (4 MCG/ML) IV SYRINGE
PREFILLED_SYRINGE | INTRAVENOUS | Status: DC | PRN
  Administered 2020-09-24: 8 ug via INTRAVENOUS
  Administered 2020-09-24: 12 ug via INTRAVENOUS

## 2020-09-24 MED ORDER — ORAL CARE MOUTH RINSE: 15.0000 mL | Freq: Once | OROMUCOSAL | Status: AC

## 2020-09-24 MED ORDER — LIDOCAINE HCL (CARDIAC) PF 100 MG/5ML IV SOSY
PREFILLED_SYRINGE | INTRAVENOUS | Status: DC | PRN
  Administered 2020-09-24: 100 mg via INTRAVENOUS

## 2020-09-24 MED ORDER — PROPOFOL 10 MG/ML IV BOLUS
INTRAVENOUS | Status: AC
  Filled 2020-09-24: qty 40

## 2020-09-24 MED ORDER — CEFAZOLIN SODIUM-DEXTROSE 2-4 GM/100ML-% IV SOLN
2.0000 g | Freq: Once | INTRAVENOUS | Status: AC
  Administered 2020-09-24: 2 g via INTRAVENOUS
  Filled 2020-09-24: qty 100

## 2020-09-24 MED ORDER — ROCURONIUM BROMIDE 100 MG/10ML IV SOLN
INTRAVENOUS | Status: DC | PRN
  Administered 2020-09-24: 50 mg via INTRAVENOUS
  Administered 2020-09-24: 20 mg via INTRAVENOUS

## 2020-09-24 MED ORDER — LACTATED RINGERS IV SOLN: INTRAVENOUS | Status: DC

## 2020-09-24 MED ORDER — LIDOCAINE HCL URETHRAL/MUCOSAL 2 % EX GEL
CUTANEOUS | Status: DC | PRN
  Administered 2020-09-24: 1 via URETHRAL

## 2020-09-24 MED ORDER — FENTANYL CITRATE (PF) 100 MCG/2ML IJ SOLN: 25.0000 ug | INTRAMUSCULAR | Status: DC | PRN

## 2020-09-24 MED ORDER — MIDAZOLAM HCL 5 MG/5ML IJ SOLN
INTRAMUSCULAR | Status: DC | PRN
  Administered 2020-09-24: 2 mg via INTRAVENOUS

## 2020-09-24 MED ORDER — FENTANYL CITRATE (PF) 100 MCG/2ML IJ SOLN
INTRAMUSCULAR | Status: AC
  Administered 2020-09-24: 50 ug via INTRAVENOUS
  Filled 2020-09-24: qty 2

## 2020-09-24 SURGICAL SUPPLY — 17 items
BAG URINE DRAIN 2000ML AR STRL (UROLOGICAL SUPPLIES) ×2 IMPLANT
BAG URO CATCHER STRL LF (MISCELLANEOUS) ×2 IMPLANT
CATH FOLEY 2WAY SLVR  5CC 18FR (CATHETERS) ×2
CATH FOLEY 2WAY SLVR 5CC 18FR (CATHETERS) ×1 IMPLANT
CATH URET 5FR 28IN OPEN ENDED (CATHETERS) ×2 IMPLANT
DRAPE FOOT SWITCH (DRAPES) ×2 IMPLANT
GLOVE SURG ENC TEXT LTX SZ7.5 (GLOVE) ×2 IMPLANT
GOWN STRL REUS W/TWL XL LVL3 (GOWN DISPOSABLE) ×2 IMPLANT
GUIDEWIRE STR DUAL SENSOR (WIRE) ×2 IMPLANT
KIT TURNOVER KIT A (KITS) ×2 IMPLANT
LOOP CUT BIPOLAR 24F LRG (ELECTROSURGICAL) ×2 IMPLANT
MANIFOLD NEPTUNE II (INSTRUMENTS) ×2 IMPLANT
PACK CYSTO (CUSTOM PROCEDURE TRAY) ×2 IMPLANT
PENCIL SMOKE EVACUATOR (MISCELLANEOUS) IMPLANT
STENT URET 6FRX26 CONTOUR (STENTS) IMPLANT
TUBING CONNECTING 10 (TUBING) ×2 IMPLANT
TUBING UROLOGY SET (TUBING) ×2 IMPLANT

## 2020-09-24 NOTE — H&P (Signed)
Office Visit Report     09/12/2020   --------------------------------------------------------------------------------   Brent [name]  MRN: [id number]  DOB: 1955/09/19, 65 year old Male  SSN: [id number]   PRIMARY CARE:  [name], MD  REFERRING:  Georgette Dover, MD  PROVIDER:  [name], M.D.  LOCATION:  [facility] [phone]     --------------------------------------------------------------------------------   CC/HPI: F/u -   1) gross hematuria - pt noted red urine on several voids in Feb 2022. Seemed to clear. AUASS today, 04/22, = 4. 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 He quit smoking yrs ago but smoked for about 20 yrs. No chemo or XRT.   CT 05/22 with right posterior bladder mass 18 mm near right UO. No LAD or worrisome bone lesions. 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   2) h/o stones - in 2015 he was treated for left UPJ stone and was noted to have a small LLP stone. No stones were seen on f/u KUB.   Today, seen for the above.   He retired from [employer]. He retired from [employer].     ALLERGIES: Morphine Derivatives Statins     MEDICATIONS: Hydrochlorothiazide 12.5 mg capsule  Metformin Hcl 500 mg tablet  Metoprolol Tartrate 100 mg tablet  Amitriptyline Hcl 50 mg tablet  Cetirizine Hcl 10 mg tablet  Cyclobenzaprine Hcl 10 mg tablet  Econazole Nitrate 1 % cream  Epinephrine 0.3 mg/0.3 ml auto-injector  Famotidine 40 mg tablet  Fenofibrate 859 mg tablet  Folic Acid  Gabapentin 300 mg capsule  Glyburide 5 mg tablet  Iron  Livalo 2 mg tablet  Losartan Potassium 50 mg tablet  Magnesium 100 mg tablet  Mometasone Furoate 0.1 % cream  Multiple Vitamin  Ocean 0.65 % aerosol, spray  Oxycodone Hcl 5 mg capsule  Pantoprazole Sodium 40 mg tablet, delayed  release  Thiamine Hcl 100 mg tablet  Vitamin B-12  Vitamin C  Vitamin D3     GU PSH: ESWL - 2015 Locm 300-399Mg /Ml Iodine,1Ml - 09/04/2020 Rpr Umbil Hern; Reduc < 5 Yr - 2015       PSH Notes: Lithotripsy, Gastric Surgery For Morbid Obesity Gastric Bypass, Knee Surgery, Tonsillectomy, Umbilical Hernia Repair, Ankle Surgery   NON-GU PSH: Ankle Arthroscopy/surgery Gastric bypass Hernia Repair Knee replacement, Right Remove Tonsils - 2015 Shoulder Arthroscopy/surgery, Bilateral     GU PMH: Gross hematuria - 09/04/2020, (Stable), send BUn , Cr and a PSA today. Check CT scan A/P and cystoscopy - discussed these tests. , - 08/01/2020, Gross hematuria, - 2015 Renal calculus, Nephrolithiasis - 2016, Renal calculus, bilateral, - 2015 Ureteral calculus, Calculus of right ureter - 2015 Abdominal Pain Unspec, Right flank pain - 2015 Hydronephrosis Unspec, Hydronephrosis, right - 2015 Other microscopic hematuria, Microscopic hematuria - 2015 Pyelonephritis, Pyelonephritis, acute - 2015    NON-GU PMH: Encounter for general adult medical examination without abnormal findings, Encounter for preventive health examination - 2015 Personal history of diseases of the blood and blood-forming organs and certain disorders involving the immune mechanism, History of bleeding disorder - 2015 Personal history of other diseases of the circulatory system, History of cardiac disorder - 2015, History of hypertension, - 2015 Personal history of other diseases of the digestive system, History of gastric ulcer - 2015 Personal history of other diseases of the musculoskeletal system  and connective tissue, History of gout - 2015, History of arthritis, - 2015 Personal history of other diseases of the nervous system and sense organs, History of sleep apnea - 2015 Personal history of other endocrine, nutritional and metabolic disease, History of hyperlipidemia - 2015, History of diabetes mellitus, - 2015 Personal history  of other specified conditions, History of heartburn - 2015 Arthritis    FAMILY HISTORY: cardiac disorder - Runs In Family Death of family member - Father, Mother Diabetes - Runs In Family hyperlipidemia - Runs In Family Hypertension - Runs In Family Kidney Stones - Brother Myocardial Infarction - Runs In Family Strokes - Runs In Family    Notes: 2 daughters   SOCIAL HISTORY: Marital Status: Married Preferred Language: English; Ethnicity: Not Hispanic Or Latino; Race: White Current Smoking Status: Patient does not smoke anymore. Has not smoked since 07/20/1991. Smoked for 20 years.   Tobacco Use Assessment Completed: Used Tobacco in last 30 days? Does not use smokeless tobacco. Does drink.  Drinks 4+ caffeinated drinks per day. Patient's occupation is/was retired.     Notes: Former smoker, Married, Mother deceased, Occupation, Caffeine use, Two children, Alcohol use, Father deceased   REVIEW OF SYSTEMS:    GU Review Male:   Patient denies frequent urination, hard to postpone urination, burning/ pain with urination, get up at night to urinate, leakage of urine, stream starts and stops, trouble starting your stream, have to strain to urinate , erection problems, and penile pain.  Gastrointestinal (Upper):   Patient denies nausea, vomiting, and indigestion/ heartburn.  Gastrointestinal (Lower):   Patient denies diarrhea and constipation.  Constitutional:   Patient denies fever, night sweats, weight loss, and fatigue.  Skin:   Patient denies skin rash/ lesion and itching.  Eyes:   Patient denies blurred vision and double vision.  Ears/ Nose/ Throat:   Patient denies sore throat and sinus problems.  Hematologic/Lymphatic:   Patient denies swollen glands and easy bruising.  Cardiovascular:   Patient denies leg swelling and chest pains.  Respiratory:   Patient denies cough and shortness of breath.  Endocrine:   Patient denies excessive thirst.  Musculoskeletal:   Patient denies back pain  and joint pain.  Neurological:   Patient denies headaches and dizziness.  Psychologic:   Patient denies depression and anxiety.   VITAL SIGNS: None   GU PHYSICAL EXAMINATION:    Scrotum: No lesions. No edema. No cysts. No warts.  Urethral Meatus: Normal size. No lesion, no wart, no discharge, no polyp. Normal location.  Penis: Circumcised, no warts, no cracks. No dorsal Peyronie's plaques, no left corporal Peyronie's plaques, no right corporal Peyronie's plaques, no scarring, no warts. No balanitis, no meatal stenosis.   MULTI-SYSTEM PHYSICAL EXAMINATION:    Constitutional: Well-nourished. No physical deformities. Normally developed. Good grooming.  Neck: Neck symmetrical, not swollen. Normal tracheal position.  Respiratory: No labored breathing, no use of accessory muscles.   Cardiovascular: Normal temperature, normal extremity pulses, no swelling, no varicosities.  Skin: No paleness, no jaundice, no cyanosis. No lesion, no ulcer, no rash.  Neurologic / Psychiatric: Oriented to time, oriented to place, oriented to person. No depression, no anxiety, no agitation.  Gastrointestinal: No mass, no tenderness, no rigidity, non obese abdomen.     Complexity of Data:   08/01/20  PSA  Total PSA 0.62 ng/mL    PROCEDURES:         Flexible Cystoscopy - 52000  Risks, benefits, and some of the potential complications of the procedure were  discussed with the patient. All questions were answered. Informed consent was obtained. Antibiotic prophylaxis was given -- Cephalexin. Sterile technique and intraurethral analgesia were used.  Meatus:  Normal size. Normal location. Normal condition.  Urethra:  No strictures.  External Sphincter:  Normal.  Verumontanum:  Normal.  Prostate:  Non-obstructing. No hyperplasia.  Bladder Neck:  Non-obstructing.  Ureteral Orifices:  Normal location. Normal size. Normal shape. Effluxed clear urine.  Bladder:  A few right lateral wall tumors - a paillary tumor just  lateral to right UO and some satellite lesions. No trabeculation. Normal mucosa. No stones.      The lower urinary tract was carefully examined. The procedure was well-tolerated and without complications. Antibiotic instructions were given. Instructions were given to call the office immediately for bloody urine, difficulty urinating, painful urination, fever, chills, nausea, vomiting or other illness. The patient stated that he understood these instructions and would comply with them.         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 We looked at his CT images. Discussed the nature r/b/a to TURBT with post op gemcitabine, possible foley and right ureteral stent. Discussed he might need restaging.    PLAN:           Schedule Return Visit/Planned Activity: Next Available Appointment - Schedule Surgery          Document Letter(s):  Created for Patient: Clinical Summary    * Signed by [name], M.D. on 09/12/20 at 4:44 PM (EDT)*     The information contained in this medical record document is considered private and confidential patient information. This information can only be used for the medical diagnosis and/or medical services that are being provided by the patient's selected caregivers. This information can only be distributed outside of the patient's care if the patient agrees and signs waivers of authorization for this information to be sent to an outside source or route.

## 2020-09-24 NOTE — Discharge Instructions (Signed)
Indwelling Urinary Catheter Care, Adult An indwelling urinary catheter is a thin tube that is put into your bladder. The tube helps to drain pee (urine) out of your body. The tube goes in through your urethra. Your urethra is where pee comes out of your body. Your pee will come out through the catheter, then it will go into a bag (drainage bag). Take good care of your catheter so it will work well.  Removal of the Foley-remove the Foley as instructed on Friday morning September 27, 2020.  How to wear your catheter and bag Supplies needed  Sticky tape (adhesive tape) or a leg strap.  Alcohol wipe or soap and water (if you use tape).  A clean towel (if you use tape).  Large overnight bag.  Smaller bag (leg bag). Wearing your catheter Attach your catheter to your leg with tape or a leg strap.  Make sure the catheter is not pulled tight.  If a leg strap gets wet, take it off and put on a dry strap.  If you use tape to hold the bag on your leg: 1. Use an alcohol wipe or soap and water to wash your skin where the tape made it sticky before. 2. Use a clean towel to pat-dry that skin. 3. Use new tape to make the bag stay on your leg. Wearing your bags You should have been given a large overnight bag.  You may wear the overnight bag in the day or night.  Always have the overnight bag lower than your bladder.  Do not let the bag touch the floor.  Before you go to sleep, put a clean plastic bag in a wastebasket. Then hang the overnight bag inside the wastebasket. You should also have a smaller leg bag that fits under your clothes.  Always wear the leg bag below your knee.  Do not wear your leg bag at night. How to care for your skin and catheter Supplies needed  A clean washcloth.  Water and mild soap.  A clean towel. Caring for your skin and catheter  Clean the skin around your catheter every day: 1. Wash your hands with soap and water. 2. Wet a clean washcloth in warm water  and mild soap. 3. Clean the skin around your urethra.  If you are male:  Gently spread the folds of skin around your vagina (labia).  With the washcloth in your other hand, wipe the inner side of your labia on each side. Wipe from front to back.  If you are male:  Pull back any skin that covers the end of your penis (foreskin).  With the washcloth in your other hand, wipe your penis in small circles. Start wiping at the tip of your penis, then move away from the catheter.  Move the foreskin back in place, if needed. 4. With your free hand, hold the catheter close to where it goes into your body.  Keep holding the catheter during cleaning so it does not get pulled out. 5. With the washcloth in your other hand, clean the catheter.  Only wipe downward on the catheter.  Do not wipe upward toward your body. Doing this may push germs into your urethra and cause infection. 6. Use a clean towel to pat-dry the catheter and the skin around it. Make sure to wipe off all soap. 7. Wash your hands with soap and water.  Shower every day. Do not take baths.  Do not use cream, ointment, or lotion on the area  where the catheter goes into your body, unless your doctor tells you to.  Do not use powders, sprays, or lotions on your genital area.  Check your skin around the catheter every day for signs of infection. Check for: ? Redness, swelling, or pain. ? Fluid or blood. ? Warmth. ? Pus or a bad smell.      How to empty the bag Supplies needed  Rubbing alcohol.  Gauze pad or cotton ball.  Tape or a leg strap. Emptying the bag Pour the pee out of your bag when it is ?- full, or at least 2-3 times a day. Do this for your overnight bag and your leg bag. 1. Wash your hands with soap and water. 2. Separate (detach) the bag from your leg. 3. Hold the bag over the toilet or a clean pail. Keep the bag lower than your hips and bladder. This is so the pee (urine) does not go back into the  tube. 4. Open the pour spout. It is at the bottom of the bag. 5. Empty the pee into the toilet or pail. Do not let the pour spout touch any surface. 6. Put rubbing alcohol on a gauze pad or cotton ball. 7. Use the gauze pad or cotton ball to clean the pour spout. 8. Close the pour spout. 9. Attach the bag to your leg with tape or a leg strap. 10. Wash your hands with soap and water. Follow instructions for cleaning the drainage bag:  From the product maker.  As told by your doctor. How to change the bag Supplies needed  Alcohol wipes.  A clean bag.  Tape or a leg strap. Changing the bag Replace your bag when it starts to leak, smell bad, or look dirty. 1. Wash your hands with soap and water. 2. Separate the dirty bag from your leg. 3. Pinch the catheter with your fingers so that pee does not spill out. 4. Separate the catheter tube from the bag tube where these tubes connect (at the connection valve). Do not let the tubes touch any surface. 5. Clean the end of the catheter tube with an alcohol wipe. Use a different alcohol wipe to clean the end of the bag tube. 6. Connect the catheter tube to the tube of the clean bag. 7. Attach the clean bag to your leg with tape or a leg strap. Do not make the bag tight on your leg. 8. Wash your hands with soap and water. General rules  Never pull on your catheter. Never try to take it out. Doing that can hurt you.  Always wash your hands before and after you touch your catheter or bag. Use a mild, fragrance-free soap. If you do not have soap and water, use hand sanitizer.  Always make sure there are no twists or bends (kinks) in the catheter tube.  Always make sure there are no leaks in the catheter or bag.  Drink enough fluid to keep your pee pale yellow.  Do not take baths, swim, or use a hot tub.  If you are male, wipe from front to back after you poop (have a bowel movement).   Contact a doctor if:  Your pee is cloudy.  Your  pee smells worse than usual.  Your catheter gets clogged.  Your catheter leaks.  Your bladder feels full. Get help right away if:  You have redness, swelling, or pain where the catheter goes into your body.  You have fluid, blood, pus, or a bad  smell coming from the area where the catheter goes into your body.  Your skin feels warm where the catheter goes into your body.  You have a fever.  You have pain in your: ? Belly (abdomen). ? Legs. ? Lower back. ? Bladder.  You see blood in the catheter.  Your pee is pink or red.  You feel sick to your stomach (nauseous).  You throw up (vomit).  You have chills.  Your pee is not draining into the bag.  Your catheter gets pulled out. Summary  An indwelling urinary catheter is a thin tube that is placed into the bladder to help drain pee (urine) out of the body.  The catheter is placed into the part of the body that drains pee from the bladder (urethra).  Taking good care of your catheter will keep it working properly and help prevent problems.  Always wash your hands before and after touching your catheter or bag.  Never pull on your catheter or try to take it out. This information is not intended to replace advice given to you by your health care provider. Make sure you discuss any questions you have with your health care provider. Document Revised: 07/29/2018 Document Reviewed: 11/20/2016 Elsevier Patient Education  2021 [employer]. Transurethral Resection of Bladder Growth, Care After This sheet gives you information about how to care for yourself after your procedure. Your health care provider may also give you more specific instructions. If you have problems or questions, contact your health care provider. What can I expect after the procedure? After the procedure, it is common to have:  A small amount of blood in your urine for up to 2 weeks.  Soreness or mild pain from your catheter. After your catheter is  removed, you may have mild soreness, especially when urinating.  Pain in your lower abdomen. Follow these instructions at home: Medicines  Take over-the-counter and prescription medicines only as told by your health care provider.  If you were prescribed an antibiotic medicine, take it as told by your health care provider. Do not stop taking the antibiotic even if you start to feel better.  Do not drive for 24 hours if you were given a sedative during your procedure.  Ask your health care provider if the medicine prescribed to you: ? Requires you to avoid driving or using heavy machinery. ? Can cause constipation. You may need to take these actions to prevent or treat constipation:  Take over-the-counter or prescription medicines.  Eat foods that are high in fiber, such as beans, whole grains, and fresh fruits and vegetables.  Limit foods that are high in fat and processed sugars, such as fried or sweet foods.   Activity  Return to your normal activities as told by your health care provider. Ask your health care provider what activities are safe for you.  Do not lift anything that is heavier than 10 lb (4.5 kg), or the limit that you are told, until your health care provider says that it is safe.  Avoid intense physical activity for as long as told by your health care provider.  Rest as told by your health care provider.  Avoid sitting for a long time without moving. Get up to take short walks every 1-2 hours. This is important to improve blood flow and breathing. Ask for help if you feel weak or unsteady. General instructions  Do not drink alcohol for as long as told by your health care provider. This is especially important  if you are taking prescription pain medicines.  Do not take baths, swim, or use a hot tub until your health care provider approves. Ask your health care provider if you may take showers. You may only be allowed to take sponge baths.  If you have a catheter,  follow instructions from your health care provider about caring for your catheter and your drainage bag.  Drink enough fluid to keep your urine pale yellow.  Wear compression stockings as told by your health care provider. These stockings help to prevent blood clots and reduce swelling in your legs.  Keep all follow-up visits as told by your health care provider. This is important. ? You will need to be followed closely with regular checks of your bladder and urethra (cystoscopies) to make sure that the cancer does not come back.   Contact a health care provider if:  You have pain that gets worse or does not improve with medicine.  You have blood in your urine for more than 2 weeks.  You have cloudy or bad-smelling urine.  You become constipated. Signs of constipation may include having: ? Fewer than three bowel movements in a week. ? Difficulty having a bowel movement. ? Stools that are dry, hard, or larger than normal.  You have a fever. Get help right away if:  You have: ? Severe pain. ? Bright red blood in your urine. ? Blood clots in your urine. ? A lot of blood in your urine.  Your catheter has been removed and you are not able to urinate.  You have a catheter in place and the catheter is not draining urine. Summary  After your procedure, it is common to have a small amount of blood in your urine, soreness or mild pain from your catheter, and pain in your lower abdomen.  Take over-the-counter and prescription medicines only as told by your health care provider.  Rest as told by your health care provider. Follow your health care provider's instructions about returning to normal activities. Ask what activities are safe for you.  If you have a catheter, follow instructions from your health care provider about caring for your catheter and your drainage bag.  Get help right away if you cannot urinate, you have severe pain, or you have bright red blood or blood clots in  your urine. This information is not intended to replace advice given to you by your health care provider. Make sure you discuss any questions you have with your health care provider. Document Revised: 11/04/2017 Document Reviewed: 11/04/2017 Elsevier Patient Education  [place].

## 2020-09-24 NOTE — Op Note (Signed)
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 is a 65 year old male with gross hematuria.  CT scan of the abdomen and pelvis revealed a 1.8 cm right bladder base tumor and cystoscopy in the office revealed the same tumor but scattered tumor around it up to just lateral to the right ureteral orifice.  Findings: On exam under anesthesia the penis was circumcised without mass or lesion.  Glans and meatus appeared normal.  Testicles descended bilaterally palpably normal.  On DRE the prostate was about 30 g and smooth without hard area or nodule.  No bladder mass palpable.  On cystoscopy the urethra was unremarkable, prostate nonobstructive, mild bladder trabeculation.  Dominant right bladder tumor on the right wall inferior and closer to the bladder neck.  Underneath this was smaller papillary tumor in patches for several centimeters going posteriorly and down just lateral to the right ureteral orifice.  The scattered patchy papillary tumor seemed superficial.  The right bladder tumor had a broader base.  Right retrograde pyelogram-this outlined a single ureter single collecting system unit without filling defect, stricture or dilation.  There was brisk drainage so I did not leave a stent.  Description of procedure: After consent was obtained patient brought to the operating room.  After adequate anesthesia was placed lithotomy position and prepped and draped in the usual sterile fashion.  Timeout was performed to confirm the patient procedure.  Exam under anesthesia was performed.  The bladder was carefully inspected with a 30 degree and 70 degree lens.  I then passed the continuous-flow sheath with the visual obturator and switch that out for the handle and loop.   I began resection of the flatter scattered papillary tumor.  This was resected off the floor the bladder and down toward the right ureteral orifice.  It appeared possibly the right ureteral orifice had been resected as the ureteral orifice look like it had moved laterally a centimeter or 2.  The bulk of the primary tumor was also resected.  I sent this all as right bladder tumor.  I then resected the base of the right bladder tumor and sent this is bladder tumor base.  Hemostasis was obtained.  To ensure that he did not need a stent I called in fluoroscopy.  I switched back out to the cystoscope.  As I was passing a sensor wire then saw E flux from the right ureteral orifice and what I had been looking at was just a small area of mucosa laterally that had not been resected.  I went ahead and cannulated the right ureteral orifice and resection had come up close to it but had not involved the mucosa of the ureteral orifice.  The right ureteral orifice was cannulated with a 5 [place] open-ended catheter and retrograde injection of contrast was performed.  This outlined a normal unit and it brings briskly therefore I decided not to leave a stent.  The cystoscope was backed out and I repassed the continuous-flow sheath and then the handle and loop.  I resected any remaining mucosa or concern for tumor.  Hemostasis was excellent under low pressure.  I was not concerned about a perforation but resection did appear to be down toward the muscle in several places.  The scope was removed and a 18 French Foley catheter placed in left to gravity  drainage.  Urine was clear.  I then placed a BNO suppository.  He was awakened taken to cover him in stable condition.  Instillation of gemcitabine in PACU-2000 mg of gemcitabine was instilled per urethra into the bladder and left indwelling for 50 minutes and then drained.  Patient will be discharged home with Foley.  Complications: None  Blood loss: Minimal  Specimens to  pathology: #1 right bladder tumor #2 right bladder tumor base  Drains: 18 French Foley catheter  Disposition: Patient stable to PACU

## 2020-09-24 NOTE — Interval H&P Note (Signed)
History and Physical Interval Note:  09/24/2020 12:47 PM  Brent [name]  has presented today for surgery, with the diagnosis of BLADDER NEOPLASM.  The various methods of treatment have been discussed with the patient and family. After consideration of risks, benefits and other options for treatment, the patient has consented to  Procedure(s): TRANSURETHRAL RESECTION OF BLADDER TUMOR (TURBT) WITH POST OPERATIVE INSTILLATION OF GEMCITABINE/POSSIBLE RIGHT URETERAL STENT PLACEMENT (N/A) as a surgical intervention.  The patient's history has been reviewed, patient examined, no change in status, stable for surgery.  I have reviewed the patient's chart and labs.  Questions were answered to the patient's satisfaction.  He is well. No congestion. No fever. No dysuria. Disc again poss stent and foley.    [name]

## 2020-09-24 NOTE — Anesthesia Procedure Notes (Signed)
Procedure Name: Intubation Date/Time: 09/24/2020 1:45 PM Performed by: Jari Pigg, CRNA Pre-anesthesia Checklist: Patient identified, Emergency Drugs available, Suction available and Patient being monitored Patient Re-evaluated:Patient Re-evaluated prior to induction Oxygen Delivery Method: Circle system utilized Preoxygenation: Pre-oxygenation with 100% oxygen Induction Type: IV induction Ventilation: Mask ventilation without difficulty Laryngoscope Size: Mac and 4 Grade View: Grade I Tube type: Oral Tube size: 7.5 mm Number of attempts: 1 Airway Equipment and Method: Stylet and Oral airway Placement Confirmation: ETT inserted through vocal cords under direct vision,  positive ETCO2 and breath sounds checked- equal and bilateral Secured at: 23 cm Tube secured with: Tape Dental Injury: Teeth and Oropharynx as per pre-operative assessment

## 2020-09-24 NOTE — Anesthesia Preprocedure Evaluation (Signed)
Anesthesia Evaluation  Patient identified by MRN, date of birth, ID band Patient awake    Reviewed: Allergy & Precautions, NPO status , Patient's Chart, lab work & pertinent test results  Airway Mallampati: II  TM Distance: >3 FB Neck ROM: Full    Dental no notable dental hx.    Pulmonary neg pulmonary ROS, former smoker,    Pulmonary exam normal breath sounds clear to auscultation       Cardiovascular hypertension, Normal cardiovascular exam Rhythm:Regular Rate:Normal     Neuro/Psych negative neurological ROS  negative psych ROS   GI/Hepatic Neg liver ROS, GERD  ,  Endo/Other  diabetes  Renal/GU negative Renal ROS  negative genitourinary   Musculoskeletal negative musculoskeletal ROS (+)   Abdominal   Peds negative pediatric ROS (+)  Hematology  (+) anemia ,   Anesthesia Other Findings   Reproductive/Obstetrics negative OB ROS                             Anesthesia Physical Anesthesia Plan  ASA: III  Anesthesia Plan: General   Post-op Pain Management:    Induction: Intravenous  PONV Risk Score and Plan: 2 and Ondansetron, Dexamethasone and Treatment may vary due to age or medical condition  Airway Management Planned: LMA  Additional Equipment:   Intra-op Plan:   Post-operative Plan: Extubation in OR  Informed Consent: I have reviewed the patients History and Physical, chart, labs and discussed the procedure including the risks, benefits and alternatives for the proposed anesthesia with the patient or authorized representative who has indicated his/her understanding and acceptance.     Dental advisory given  Plan Discussed with: CRNA and Surgeon  Anesthesia Plan Comments:         Anesthesia Quick Evaluation

## 2020-09-24 NOTE — Transfer of Care (Signed)
Immediate Anesthesia Transfer of Care Note  Patient: Brent [name]  Procedure(s) Performed: TRANSURETHRAL RESECTION OF BLADDER TUMOR (TURBT)  RIGHT retrograde pylegram (N/A Bladder)  Patient Location: PACU  Anesthesia Type:General  Level of Consciousness: awake, alert  and oriented  Airway & Oxygen Therapy: Patient Spontanous Breathing  Post-op Assessment: Report given to RN and Post -op Vital signs reviewed and stable  Post vital signs: Reviewed and stable  Last Vitals:  Vitals Value Taken Time  BP 139/78 09/24/20 1444  Temp    Pulse 86 09/24/20 1445  Resp 16 09/24/20 1445  SpO2 96 % 09/24/20 1445    Last Pain:  Vitals:   09/24/20 1127  TempSrc: Oral  PainSc:          Complications: No complications documented.

## 2020-09-25 ENCOUNTER — Encounter (HOSPITAL_COMMUNITY): Payer: Self-pay | Admitting: Urology

## 2020-09-25 LAB — HEMOGLOBIN A1C
Hgb A1c MFr Bld: 8 % — ABNORMAL HIGH (ref 4.8–5.6)
Mean Plasma Glucose: 183 mg/dL

## 2020-09-25 NOTE — Anesthesia Postprocedure Evaluation (Signed)
Anesthesia Post Note  Patient: Treyvin Glidden Amborn  Procedure(s) Performed: TRANSURETHRAL RESECTION OF BLADDER TUMOR (TURBT)  RIGHT retrograde pylegram (N/A Bladder)     Patient location during evaluation: PACU Anesthesia Type: General Level of consciousness: awake and alert Pain management: pain level controlled Vital Signs Assessment: post-procedure vital signs reviewed and stable Respiratory status: spontaneous breathing, nonlabored ventilation, respiratory function stable and patient connected to nasal cannula oxygen Cardiovascular status: blood pressure returned to baseline and stable Postop Assessment: no apparent nausea or vomiting Anesthetic complications: no   No complications documented.  Last Vitals:  Vitals:   09/24/20 1615 09/24/20 1635  BP: 136/69 (!) 142/77  Pulse: 77 77  Resp: (!) 23 16  Temp: 36.7 C 36.7 C  SpO2: 95% 96%    Last Pain:  Vitals:   09/24/20 1645  TempSrc:   PainSc: 3                  Marya Lowden S

## 2020-09-26 ENCOUNTER — Encounter: Payer: Self-pay | Admitting: Family

## 2020-09-26 ENCOUNTER — Encounter: Payer: Self-pay | Admitting: Family Medicine

## 2020-09-26 LAB — SURGICAL PATHOLOGY

## 2020-09-30 ENCOUNTER — Telehealth: Payer: Self-pay | Admitting: Family Medicine

## 2020-09-30 ENCOUNTER — Encounter: Payer: Self-pay | Admitting: Family

## 2020-09-30 NOTE — Telephone Encounter (Signed)
Pt's wife is aware of Dr. Charlett Blake not being in the office today but has an appointment tomorrow

## 2020-09-30 NOTE — Telephone Encounter (Signed)
Pt,wife called she is requesting lab work for pt she stated all he does is sleep all day she would like to have his blood sugar check  Please advice

## 2020-10-01 ENCOUNTER — Ambulatory Visit (INDEPENDENT_AMBULATORY_CARE_PROVIDER_SITE_OTHER): Payer: Medicare Other | Admitting: Family Medicine

## 2020-10-01 ENCOUNTER — Encounter: Payer: Self-pay | Admitting: Family Medicine

## 2020-10-01 ENCOUNTER — Other Ambulatory Visit: Payer: Self-pay

## 2020-10-01 VITALS — BP 112/68 | HR 76 | Temp 98.2°F | Resp 18 | Ht 69.0 in | Wt 246.0 lb

## 2020-10-01 DIAGNOSIS — E559 Vitamin D deficiency, unspecified: Secondary | ICD-10-CM | POA: Diagnosis not present

## 2020-10-01 DIAGNOSIS — Z0000 Encounter for general adult medical examination without abnormal findings: Secondary | ICD-10-CM

## 2020-10-01 DIAGNOSIS — K625 Hemorrhage of anus and rectum: Secondary | ICD-10-CM | POA: Diagnosis not present

## 2020-10-01 DIAGNOSIS — E1100 Type 2 diabetes mellitus with hyperosmolarity without nonketotic hyperglycemic-hyperosmolar coma (NKHHC): Secondary | ICD-10-CM

## 2020-10-01 DIAGNOSIS — E538 Deficiency of other specified B group vitamins: Secondary | ICD-10-CM

## 2020-10-01 NOTE — Progress Notes (Signed)
Patient ID: Brent [name], male    DOB: [birth year]  Age: 65 y.o. MRN: [id number]    Subjective:  Subjective  HPI Brent [name] presents for comprehensive physical exam today and follow up on management of chronic concerns. He reports that on May 15th he experienced a fever of 104 degrees and extreme flank pain that he states was very similar to the pain he experienced when passing a kidney stone. He states that the fever might have been due to the gardening work he was doing the day before. He states that the pain was bad enough that on the 19th he went to urgent care to get it checked. During the ER visit, he reports that he was taken off Cyclobenzaprine 10 MG and put on Baclofen 10 MG 3x a day and has had an Xray done on his bladder in which they found a mass that needed to be taken out. He denies chest pain, SOB, abdominal pain, cough, chills, sore throat, dysuria, urinary incontinence, HA, or N/VD. He reports that his glucose level reading shot up to 500 and his last A1C was 8. Lab Results  Component Value Date   HGBA1C 8.0 (H) 09/24/2020   He states that the fever was god and bowels are moving ok. He reports that within the last month he has lost 5 lbs despite eating like normal.    On 09/24/2020, He reports that he has had a TRANSURETHRAL RESECTION OF BLADDER TUMOR (TURBT)  RIGHT retrograde pylegram  He reports having a perineal cyst that did not bother him, but has lasted from 2-3 weeks then would come back again in a couple of months. He reports symptoms of blood in stool and tissues.   Review of Systems  Constitutional:  Negative for chills, fatigue and fever.  HENT:  Negative for congestion, rhinorrhea, sinus pressure, sinus pain and sore throat.   Eyes:  Negative for pain.  Respiratory:  Negative for cough and shortness of breath.   Cardiovascular:  Negative for chest pain, palpitations and leg swelling.  Gastrointestinal:  Positive for anal bleeding and blood in stool. Negative  for abdominal pain, diarrhea, nausea and vomiting.  Genitourinary:  Negative for frequency and penile pain.  Musculoskeletal:  Negative for back pain.  Neurological:  Negative for headaches.   History Past Medical History:  Diagnosis Date   Acid reflux disease    ACID REFLUX DISEASE 07/05/2007   Acute gastric ulcer with bleeding    Alcohol abuse    Anemia    Atherosclerosis    Benign neoplasm of colon 07/05/2007   Bilateral hip pain 10/05/2016   Breast pain, left 11/17/2011   CAD (coronary artery disease)    Chest pain    a. Reportedly negative dobut echo performed prior to gastric bypass in 03/2011;  b. 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 12/2011 Cath: LM nl, LAD 50p, D1 55m, LCX min irregs, OM3 30, RCA 25p, 12m (FFR 0.99->0.89), PDA 30, EF 65%, Med Rx.   COLONIC POLYPS, HX OF 10/29/2008   Diarrhea 06/13/2010   Diverticulosis 07/05/2018   DIVERTICULOSIS, COLON 10/29/2008   DM (diabetes mellitus), type 2, uncontrolled ([place])    GI bleed    Gout 03/04/2013   Hearing loss 05/24/2013   Previous audiology evaluation completely.   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   Neck pain 03/2015   Other and unspecified hyperlipidemia 11/16/2012   Post-operative nausea and vomiting    after the surgery in hospital in March 2020/ past the cauderization   Preventative health care 11/17/2011   Renal stone 11/2013   Sleep apnea    a. CPAP   Spinal stenosis    Tear of meniscus of left knee 2012    He has a past surgical history that includes Ankle surgery (Right, 1994); Tonsillectomy (age 17); colonoscopy polyps; Knee arthroscopy (Left, 11/06/2010); Gastric bypass; Hernia repair; Upper gi endoscopy (03/27/13); Esophagogastroduodenoscopy (N/A, 03/27/2013);  Cardiac catheterization; left heart catheterization with coronary angiogram (N/A, 12/23/2011); right knee arthroscopy (Right, 07/05/14); Total knee arthroplasty (Right, 01/20/2016); Esophagogastroduodenoscopy (egd) with propofol (N/A, 07/06/2018); Schlerotherapy (07/06/2018); Hot hemostasis (N/A, 07/06/2018); Replacement total knee (Right, 01/2016); Rotator cuff repair (2019); Biceps tendon repair; Colonoscopy; Tonsillectomy; and Transurethral resection of bladder tumor (N/A, 09/24/2020).   His family history includes ADD / ADHD in his daughter; Colon polyps in his father; Diabetes in his brother, brother, brother, mother, and sister; Heart attack in his brother and brother; Heart attack (age of onset: 57) in his father; Heart disease in his brother, brother, and brother; Hyperlipidemia in his mother; Hypertension in his father, maternal grandmother, and mother; Obesity in his brother; Stroke in his father, mother, and sister.He reports that he quit smoking about 29 years ago. His smoking use included cigarettes. He has a 30.00 pack-year smoking history. He has never used smokeless tobacco. He reports current alcohol use of about 3.0 standard drinks of alcohol per week. He reports that he does not use drugs.  Current Outpatient Medications on File Prior to Visit  Medication Sig Dispense Refill   acetaminophen (TYLENOL) 500 MG tablet Take 1,000 mg by mouth every 6 (six) hours as needed for moderate pain or headache.     amitriptyline (ELAVIL) 50 MG tablet Take 1 tablet (50 mg total) by mouth at bedtime. 90 tablet 3   cetirizine (ZYRTEC) 10 MG tablet Take 10 mg by mouth at bedtime.     Cholecalciferol (DIALYVITE VITAMIN D 5000) 125 MCG (5000 UT) capsule Take 5,000 Units by mouth daily.     Cyanocobalamin (VITAMIN B-12) 1000 MCG SUBL Place 1,000 mcg under the tongue once a week.     econazole nitrate 1 % cream Apply topically daily. (Patient taking differently: Apply 1 application topically daily as needed (fungus).)  30 g 1   EPINEPHRINE 0.3 mg/0.3 mL IJ SOAJ injection INJECT 0.3 MLS (0.3 MG TOTAL) INTO THE MUSCLE ONCE FOR 1 DOSE (Patient taking differently: Inject 0.3 mg into the muscle as needed for anaphylaxis.) 2 each 11   famotidine (PEPCID) 40 MG tablet TAKE 1 TABLET AT BEDTIME AS NEEDED FOR HEARTBURN OR INDIGESTION (Patient taking differently: Take 40 mg by mouth daily as needed for heartburn.) 90 tablet 3   fenofibrate 160 MG tablet TAKE 1 TABLET DAILY (Patient taking differently: Take 160 mg by mouth daily.) 90 tablet 3   Ferrous Gluconate-C-Folic Acid (IRON-C PO) Take 1 tablet by mouth daily. Iron 65 mg and vit C-125 mg     gabapentin (NEURONTIN) 300 MG capsule TAKE 1 CAPSULE TWICE A DAY AND 2 CAPSULES AT BEDTIME (Patient taking differently: Take 300-600 mg by mouth See admin instructions. Take 300 mg twice daily and 600 mg at bedtime) 360 capsule 3   glucose blood (FREESTYLE LITE) test strip  Use to check blood sugar 4 times day.  Dx Code: E11.9 400 each 1   glyBURIDE (DIABETA) 5 MG tablet TAKE 1 TABLET DAILY (Patient taking differently: Take 5 mg by mouth 2 (two) times daily with a meal.) 30 tablet 11   insulin lispro (HUMALOG KWIKPEN) 100 UNIT/ML KwikPen Check BS tid with meals and qhs 0-200 no units, 201-250 2 units, 251-300 4 units, 301-350 6 units, 351-400 8 units, 401-450 10 units, call MD >450 (Patient taking differently: Inject 2-10 Units into the skin 4 (four) times daily. Check BS tid with meals and qhs 0-200 no units, 201-250 2 units, 251-300 4 units, 301-350 6 units, 351-400 8 units, 401-450 10 units, call MD >450) 15 mL 0   Insulin Pen Needle (PEN NEEDLES 31GX5/16") 31G X 8 MM MISC Use as directed with Humalog 3 times a day and at bedtime 100 each 0   LIVALO 2 MG TABS TAKE 1 TABLET DAILY (Patient taking differently: Take 2 mg by mouth at bedtime.) 90 tablet 3   losartan (COZAAR) 50 MG tablet TAKE 1 TABLET DAILY (Patient taking differently: Take 50 mg by mouth daily.) 90 tablet 1   Magnesium 100  MG CAPS Take 1 capsule (100 mg total) by mouth daily. 30 capsule 1   metoprolol tartrate (LOPRESSOR) 100 MG tablet TAKE 1 TABLET TWICE A DAY (Patient taking differently: Take 100 mg by mouth 2 (two) times daily.) 180 tablet 3   mometasone (ELOCON) 0.1 % cream Apply 1 application topically daily. (Patient taking differently: Apply 1 application topically daily as needed (poison ivy).) 90 g 1   Multiple Vitamin (MULTIVITAMIN) tablet Take 1 tablet by mouth daily. BARIATRIC VIT     oxycodone (OXY-IR) 5 MG capsule Take 5 mg by mouth 2 (two) times daily.     pantoprazole (PROTONIX) 40 MG tablet TAKE 1 TABLET TWICE A DAY BEFORE MEALS (Patient taking differently: Take 40 mg by mouth 2 (two) times daily before a meal.) 180 tablet 3   sodium chloride (OCEAN) 0.65 % SOLN nasal spray Place 1 spray into both nostrils as needed for congestion (nose irritation). 30 mL 0   Vitamin D, Ergocalciferol, (DRISDOL) 1.25 MG (50000 UNIT) CAPS capsule TAKE 1 CAPSULE EVERY 7 DAYS (Patient taking differently: Take 50,000 Units by mouth every Wednesday.) 12 capsule 3   No current facility-administered medications on file prior to visit.     Objective:  Objective  Physical Exam Constitutional:      General: He is not in acute distress.    Appearance: Normal appearance. He is not ill-appearing or toxic-appearing.  HENT:     Head: Normocephalic and atraumatic.     Right Ear: Tympanic membrane, ear canal and external ear normal.     Left Ear: Tympanic membrane, ear canal and external ear normal.     Nose: No congestion or rhinorrhea.  Eyes:     Extraocular Movements: Extraocular movements intact.     Right eye: No nystagmus.     Left eye: No nystagmus.     Pupils: Pupils are equal, round, and reactive to light.  Cardiovascular:     Rate and Rhythm: Normal rate and regular rhythm.     Pulses: Normal pulses.     Heart sounds: Normal heart sounds. No murmur heard. Pulmonary:     Effort: Pulmonary effort is normal. No  respiratory distress.     Breath sounds: Normal breath sounds. No wheezing, rhonchi or rales.  Abdominal:     General: Bowel sounds  are normal.     Palpations: Abdomen is soft. There is no mass.     Tenderness: no abdominal tenderness There is no guarding.     Hernia: No hernia is present.  Musculoskeletal:        General: Normal range of motion.     Cervical back: Normal range of motion and neck supple.  Skin:    General: Skin is warm and dry.  Neurological:     Mental Status: He is alert and oriented to person, place, and time.  Psychiatric:        Behavior: Behavior normal.   BP 112/68   Pulse 76   Temp 98.2 F (36.8 C)   Resp 18   Ht 5\' 9"  (1.753 m)   Wt 246 lb (111.6 kg)   SpO2 99%   BMI 36.33 kg/m  Wt Readings from Last 3 Encounters:  10/01/20 246 lb (111.6 kg)  09/24/20 240 lb (108.9 kg)  09/05/20 248 lb (112.5 kg)     Lab Results  Component Value Date   WBC 4.1 09/24/2020   HGB 9.9 (L) 09/24/2020   HCT 31.7 (L) 09/24/2020   PLT 209 09/24/2020   GLUCOSE 227 (H) 09/24/2020   CHOL 163 01/11/2020   TRIG 379 (H) 01/11/2020   HDL 34 (L) 01/11/2020   LDLDIRECT 85.0 11/17/2016   LDLCALC 83 01/11/2020   ALT 31 09/24/2020   AST 40 09/24/2020   NA 136 09/24/2020   K 4.2 09/24/2020   CL 103 09/24/2020   CREATININE 1.02 09/24/2020   BUN 15 09/24/2020   CO2 25 09/24/2020   TSH 1.44 01/11/2020   PSA 1.55 06/25/2017   INR 1.3 (H) 07/06/2018   HGBA1C 8.0 (H) 09/24/2020   MICROALBUR 1.1 12/02/2015    DG C-Arm 1-60 Min-No Report  Result Date: 09/24/2020 Fluoroscopy was utilized by the requesting physician.  No radiographic interpretation.     Assessment & Plan:  Plan    No orders of the defined types were placed in this encounter.   Problem List Items Addressed This Visit     Morbid obesity ([place])    Encouraged DASH or MIND diet, decrease po intake and increase exercise as tolerated. Needs 7-8 hours of sleep nightly. Avoid trans fats, eat small, frequent  meals every 4-5 hours with lean proteins, complex carbs and healthy fats. Minimize simple carbs, high fat foods and processed foods       T2DM (type 2 diabetes mellitus) ([place]) - Primary    During his bladder cancer treatment he became ill and his blood sugars began to fluctuate wildly up above 500 at times so he was started on a sliding scale of regular insulin and now as he recovers his numbers look better, he continues to use the sliding scale prn. Polyuria and polydipsia are improving. No changes at this time. Will continue to monitor closely. Is referred to endocrinology for further evaluation       Relevant Orders   Ambulatory referral to Endocrinology   Vitamin D deficiency    Supplement and monitor       Preventative health care    Patient encouraged to maintain heart healthy diet, regular exercise, adequate sleep. Consider daily probiotics. Take medications as prescribed. Labs reviewed. Immunizations reviewed and discussed. Colonoscopy last 2018 due soon       Vitamin B12 deficiency    Supplement and monitor       Other Visit Diagnoses     Rectal bleeding  Relevant Orders   Ambulatory referral to Gastroenterology       Follow-up: Return in about 3 months (around 01/01/2021) for annual exam.   I,David Hanna,acting as a scribe for [name], MD.,have documented all relevant documentation on the behalf of [name], MD,as directed by  [name], MD while in the presence of [name], MD.  I, [name], MD personally performed the services described in this documentation. All medical record entries made by the scribe were at my direction and in my presence. I have reviewed the chart and agree that the record reflects my personal performance and is accurate and complete

## 2020-10-01 NOTE — Patient Instructions (Signed)
[email or web address] for testing PCR for COVID  Paxlovid is the new covid medicine  Preventive Care 54-65 Years Old, Male Preventive care refers to lifestyle choices and visits with your health care provider that can promote health and wellness. This includes: A yearly physical exam. This is also called an annual wellness visit. Regular dental and eye exams. Immunizations. Screening for certain conditions. Healthy lifestyle choices, such as: Eating a healthy diet. Getting regular exercise. Not using drugs or products that contain nicotine and tobacco. Limiting alcohol use. What can I expect for my preventive care visit? Physical exam Your health care provider will check your: Height and weight. These may be used to calculate your BMI (body mass index). BMI is a measurement that tells if you are at a healthy weight. Heart rate and blood pressure. Body temperature. Skin for abnormal spots. Counseling Your health care provider may ask you questions about your: Past medical problems. Family's medical history. Alcohol, tobacco, and drug use. Emotional well-being. Home life and relationship well-being. Sexual activity. Diet, exercise, and sleep habits. Work and work Statistician. Access to firearms. What immunizations do I need?  Vaccines are usually given at various ages, according to a schedule. Your health care provider will recommend vaccines for you based on your age, medicalhistory, and lifestyle or other factors, such as travel or where you work. What tests do I need? Blood tests Lipid and cholesterol levels. These may be checked every 5 years, or more often if you are over 67 years old. Hepatitis C test. Hepatitis B test. Screening Lung cancer screening. You may have this screening every year starting at age 63 if you have a 30-pack-year history of smoking and currently smoke or have quit within the past 15 years. Prostate cancer screening. Recommendations will vary depending on  your family history and other risks. Genital exam to check for testicular cancer or hernias. Colorectal cancer screening. All adults should have this screening starting at age 78 and continuing until age 87. Your health care provider may recommend screening at age 68 if you are at increased risk. You will have tests every 1-10 years, depending on your results and the type of screening test. Diabetes screening. This is done by checking your blood sugar (glucose) after you have not eaten for a while (fasting). You may have this done every 1-3 years. STD (sexually transmitted disease) testing, if you are at risk. Follow these instructions at home: Eating and drinking  Eat a diet that includes fresh fruits and vegetables, whole grains, lean protein, and low-fat dairy products. Take vitamin and mineral supplements as recommended by your health care provider. Do not drink alcohol if your health care provider tells you not to drink. If you drink alcohol: Limit how much you have to 0-2 drinks a day. Be aware of how much alcohol is in your drink. In the [place] one drink equals one 12 oz bottle of beer (355 mL), one 5 oz glass of wine (148 mL), or one 1 oz glass of hard liquor (44 mL).  Lifestyle Take daily care of your teeth and gums. Brush your teeth every morning and night with fluoride toothpaste. Floss one time each day. Stay active. Exercise for at least 30 minutes 5 or more days each week. Do not use any products that contain nicotine or tobacco, such as cigarettes, e-cigarettes, and chewing tobacco. If you need help quitting, ask your health care provider. Do not use drugs. If you are sexually active, practice safe sex. Use a condom  or other form of protection to prevent STIs (sexually transmitted infections). If told by your health care provider, take low-dose aspirin daily starting at age 60. Find healthy ways to cope with stress, such as: Meditation, yoga, or listening to  music. Journaling. Talking to a trusted person. Spending time with friends and family. Safety Always wear your seat belt while driving or riding in a vehicle. Do not drive: If you have been drinking alcohol. Do not ride with someone who has been drinking. When you are tired or distracted. While texting. Wear a helmet and other protective equipment during sports activities. If you have firearms in your house, make sure you follow all gun safety procedures. What's next? Go to your health care provider once a year for an annual wellness visit. Ask your health care provider how often you should have your eyes and teeth checked. Stay up to date on all vaccines. This information is not intended to replace advice given to you by your health care provider. Make sure you discuss any questions you have with your healthcare provider. Document Revised: 01/03/2019 Document Reviewed: 03/31/2018 Elsevier Patient Education  2022 [employer].

## 2020-10-02 ENCOUNTER — Other Ambulatory Visit: Payer: Self-pay | Admitting: Family Medicine

## 2020-10-02 NOTE — Assessment & Plan Note (Signed)
During his bladder cancer treatment he became ill and his blood sugars began to fluctuate wildly up above 500 at times so he was started on a sliding scale of regular insulin and now as he recovers his numbers look better, he continues to use the sliding scale prn. Polyuria and polydipsia are improving. No changes at this time. Will continue to monitor closely. Is referred to endocrinology for further evaluation

## 2020-10-02 NOTE — Assessment & Plan Note (Signed)
Supplement and monitor 

## 2020-10-02 NOTE — Assessment & Plan Note (Signed)
Patient encouraged to maintain heart healthy diet, regular exercise, adequate sleep. Consider daily probiotics. Take medications as prescribed. Labs reviewed. Immunizations reviewed and discussed. Colonoscopy last 2018 due soon 

## 2020-10-02 NOTE — Assessment & Plan Note (Signed)
Encouraged DASH or MIND diet, decrease po intake and increase exercise as tolerated. Needs 7-8 hours of sleep nightly. Avoid trans fats, eat small, frequent meals every 4-5 hours with lean proteins, complex carbs and healthy fats. Minimize simple carbs, high fat foods and processed foods 

## 2020-10-05 DIAGNOSIS — G4733 Obstructive sleep apnea (adult) (pediatric): Secondary | ICD-10-CM | POA: Diagnosis not present

## 2020-10-05 DIAGNOSIS — G4730 Sleep apnea, unspecified: Secondary | ICD-10-CM | POA: Diagnosis not present

## 2020-10-06 ENCOUNTER — Other Ambulatory Visit: Payer: Self-pay | Admitting: Family Medicine

## 2020-10-08 ENCOUNTER — Other Ambulatory Visit: Payer: Self-pay | Admitting: Family Medicine

## 2020-10-11 DIAGNOSIS — C672 Malignant neoplasm of lateral wall of bladder: Secondary | ICD-10-CM | POA: Diagnosis not present

## 2020-10-25 ENCOUNTER — Encounter: Payer: Self-pay | Admitting: Family Medicine

## 2020-10-25 ENCOUNTER — Other Ambulatory Visit: Payer: Self-pay | Admitting: Family Medicine

## 2020-10-25 DIAGNOSIS — E1165 Type 2 diabetes mellitus with hyperglycemia: Secondary | ICD-10-CM | POA: Diagnosis not present

## 2020-10-25 DIAGNOSIS — Z794 Long term (current) use of insulin: Secondary | ICD-10-CM | POA: Diagnosis not present

## 2020-10-25 MED ORDER — VITAMIN D (ERGOCALCIFEROL) 1.25 MG (50000 UNIT) PO CAPS: 50000.0000 [IU] | ORAL_CAPSULE | ORAL | 3 refills | Status: DC

## 2020-10-29 ENCOUNTER — Other Ambulatory Visit: Payer: Self-pay | Admitting: *Deleted

## 2020-10-29 MED ORDER — INSULIN LISPRO (1 UNIT DIAL) 100 UNIT/ML (KWIKPEN): 2.0000 [IU] | PEN_INJECTOR | Freq: Three times a day (TID) | SUBCUTANEOUS | 0 refills | Status: DC

## 2020-10-29 MED ORDER — VITAMIN D (ERGOCALCIFEROL) 1.25 MG (50000 UNIT) PO CAPS: 50000.0000 [IU] | ORAL_CAPSULE | ORAL | 1 refills | Status: DC

## 2020-10-31 ENCOUNTER — Other Ambulatory Visit: Payer: Self-pay | Admitting: *Deleted

## 2020-10-31 ENCOUNTER — Ambulatory Visit (INDEPENDENT_AMBULATORY_CARE_PROVIDER_SITE_OTHER): Payer: Medicare Other | Admitting: Gastroenterology

## 2020-10-31 ENCOUNTER — Encounter: Payer: Self-pay | Admitting: Gastroenterology

## 2020-10-31 VITALS — BP 108/60 | HR 76 | Ht 65.75 in | Wt 256.1 lb

## 2020-10-31 DIAGNOSIS — K603 Anal fistula: Secondary | ICD-10-CM | POA: Insufficient documentation

## 2020-10-31 MED ORDER — INSULIN LISPRO (1 UNIT DIAL) 100 UNIT/ML (KWIKPEN): 2.0000 [IU] | PEN_INJECTOR | Freq: Three times a day (TID) | SUBCUTANEOUS | 0 refills | Status: DC

## 2020-10-31 NOTE — Progress Notes (Signed)
Noted  

## 2020-10-31 NOTE — Progress Notes (Signed)
10/31/2020 Brent [name] [id number] [birth year]   HISTORY OF PRESENT ILLNESS:  This is a 65 year old male who is a patient of Dr. Blanch Media, last seen here in 07/2018.  He is here today with complaints of a "cyst" near his anus.  Says that he noticed it at the beginning of the year.  Occasional he sees bright red blood that drains from it and sometimes just clear fluid.  No blood in about a month.  Not painful.  No constipation or straining.  Last colonoscopy 07/2016:  - One 2 mm polyp in the ascending colon, removed with a cold snare. Resected and retrieved.  Tubular adenoma on pathology. - Internal hemorrhoids. - The examination was otherwise normal on direct and retroflexion views.  Repeat recommended in 5 years.   Past Medical History:  Diagnosis Date   Acid reflux disease    ACID REFLUX DISEASE 07/05/2007   Acute gastric ulcer with bleeding    Alcohol abuse    Anemia    Atherosclerosis    Benign neoplasm of colon 07/05/2007   Bilateral hip pain 10/05/2016   Bladder cancer ([place])    Breast pain, left 11/17/2011   CAD (coronary artery disease)    Chest pain    a. Reportedly negative dobut echo performed prior to gastric bypass in 03/2011;  b. 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 12/2011 Cath: LM nl, LAD 50p, D1 100m, LCX min irregs, OM3 30, RCA 25p, 22m (FFR 0.99->0.89), PDA 30, EF 65%, Med Rx.   COLONIC POLYPS, HX OF 10/29/2008   Diarrhea 06/13/2010   Diverticulosis 07/05/2018   DIVERTICULOSIS, COLON 10/29/2008   DM (diabetes mellitus), type 2, uncontrolled ([place])    GI bleed    Gout 03/04/2013   Hearing loss 05/24/2013   Previous audiology evaluation completely.   HTN (hypertension) 07/14/2010   Hx of colonic polyps    HYPERSOMNIA, ASSOCIATED WITH SLEEP APNEA 07/26/2008   Hypertension 07/14/2010   Impotence of organic origin 07/05/2007   Internal hemorrhoids    Knee pain, left 10/10/2010   Morbid obesity ([place]) 03/27/2010   a. 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   Neck  pain 03/2015   Other and unspecified hyperlipidemia 11/16/2012   Post-operative nausea and vomiting    after the surgery in hospital in March 2020/ past the cauderization   Preventative health care 11/17/2011   Renal stone 11/2013   Sleep apnea    a. CPAP   Spinal stenosis    Tear of meniscus of left knee 2012   Past Surgical History:  Procedure Laterality Date   ANKLE SURGERY Right 1994   BICEPS TENDON REPAIR     left side   CARDIAC CATHETERIZATION     denies any chest pain in the past 2 years   COLONOSCOPY     colonoscopy polyps     ESOPHAGOGASTRODUODENOSCOPY N/A 03/27/2013   Procedure: ESOPHAGOGASTRODUODENOSCOPY (EGD);  Surgeon: Irene Shipper, MD;  Location: [facility];  Service: Endoscopy;  Laterality: N/A;   ESOPHAGOGASTRODUODENOSCOPY (EGD) WITH PROPOFOL N/A 07/06/2018   Procedure: ESOPHAGOGASTRODUODENOSCOPY (EGD) WITH PROPOFOL;  Surgeon: Jerene Bears, MD;  Location: [facility];  Service: Gastroenterology;  Laterality: N/A;   GASTRIC BYPASS     HERNIA REPAIR     HOT HEMOSTASIS N/A 07/06/2018   Procedure: HOT HEMOSTASIS (ARGON PLASMA COAGULATION/BICAP);  Surgeon: Jerene Bears, MD;  Location: [facility];  Service: Gastroenterology;  Laterality: N/A;   KNEE ARTHROSCOPY Left 11/06/2010   Left, torn meniscus (  repaired)   LEFT HEART CATHETERIZATION WITH CORONARY ANGIOGRAM N/A 12/23/2011   Procedure: LEFT HEART CATHETERIZATION WITH CORONARY ANGIOGRAM;  Surgeon: Minus Breeding, MD;  Location: [facility];  Service: Cardiovascular;  Laterality: N/A;   REPLACEMENT TOTAL KNEE Right 01/2016   removed scar tissue/ cut tip of nerve bundle and re-route   right knee arthroscopy Right 07/05/14   Dr. Hart Robinsons, [place].   ROTATOR CUFF REPAIR  2019   left   SCHLEROTHERAPY  07/06/2018   Procedure: SCHLEROTHERAPY;  Surgeon: Jerene Bears, MD;  Location: [facility];  Service: Gastroenterology;;   TONSILLECTOMY  age 68   TONSILLECTOMY     as a child   TOTAL KNEE ARTHROPLASTY Right  01/20/2016   Procedure: RIGHT TOTAL KNEE ARTHROPLASTY;  Surgeon: Paralee Cancel, MD;  Location: [facility];  Service: Orthopedics;  Laterality: Right;   TRANSURETHRAL RESECTION OF BLADDER TUMOR N/A 09/24/2020   Procedure: TRANSURETHRAL RESECTION OF BLADDER TUMOR (TURBT)  RIGHT retrograde pylegram;  Surgeon: [name], MD;  Location: [facility];  Service: Urology;  Laterality: N/A;   UPPER GI ENDOSCOPY  03/27/13    reports that he quit smoking about 29 years ago. His smoking use included cigarettes. He has a 30.00 pack-year smoking history. He has never used smokeless tobacco. He reports current alcohol use of about 3.0 standard drinks of alcohol per week. He reports that he does not use drugs. family history includes ADD / ADHD in his daughter; Colon polyps in his father; Diabetes in his brother, brother, brother, mother, and sister; Heart attack in his brother and brother; Heart attack (age of onset: 27) in his father; Heart disease in his brother, brother, and brother; Hyperlipidemia in his mother; Hypertension in his father, maternal grandmother, and mother; Obesity in his brother; Stroke in his father, mother, and sister. Allergies  Allergen Reactions   Bee Venom Anaphylaxis   Lipitor [Atorvastatin] Other (See Comments)    Myalgias, memory changes   Morphine Other (See Comments)    hyperactive   Metformin And Related Other (See Comments)    Myalgias and weakness   Baclofen     Acted drunk   Medrol [Methylprednisolone]     Hyperglycemia    Simvastatin     Joint pain   Hydrocodone Itching   Pravastatin Other (See Comments)    Made joints      Outpatient Encounter Medications as of 10/31/2020  Medication Sig   acetaminophen (TYLENOL) 500 MG tablet Take 1,000 mg by mouth every 6 (six) hours as needed for moderate pain or headache.   amitriptyline (ELAVIL) 50 MG tablet Take 1 tablet (50 mg total) by mouth at bedtime.   cetirizine (ZYRTEC) 10 MG tablet Take 10 mg by mouth at bedtime.    Cholecalciferol (DIALYVITE VITAMIN D 5000) 125 MCG (5000 UT) capsule Take 5,000 Units by mouth daily.   Cyanocobalamin (VITAMIN B-12) 1000 MCG SUBL Place 1,000 mcg under the tongue once a week.   cyclobenzaprine (FLEXERIL) 10 MG tablet Take 1 tablet by mouth daily.   econazole nitrate 1 % cream Apply topically daily. (Patient taking differently: Apply 1 application topically daily as needed (fungus).)   famotidine (PEPCID) 40 MG tablet TAKE 1 TABLET AT BEDTIME AS NEEDED FOR HEARTBURN OR INDIGESTION (Patient taking differently: Take 40 mg by mouth daily as needed for heartburn.)   fenofibrate 160 MG tablet TAKE 1 TABLET DAILY (Patient taking differently: Take 160 mg by mouth daily.)   Ferrous Gluconate-C-Folic Acid (IRON-C PO) Take 1 tablet  by mouth daily. Iron 65 mg and vit C-125 mg   gabapentin (NEURONTIN) 300 MG capsule TAKE 1 CAPSULE TWICE A DAY AND 2 CAPSULES AT BEDTIME (Patient taking differently: Take 300-600 mg by mouth See admin instructions. Take 300 mg twice daily and 600 mg at bedtime)   glucose blood (FREESTYLE LITE) test strip Use to check blood sugar 4 times day.  Dx Code: E11.9   glyBURIDE (DIABETA) 5 MG tablet TAKE 1 TABLET DAILY (Patient taking differently: Take 5 mg by mouth 2 (two) times daily with a meal.)   insulin lispro (HUMALOG) 100 UNIT/ML KwikPen Inject 2-6 Units into the skin 3 (three) times daily. Check BS tid with meals 0-200: no insulin, 201-250: 2 units, 251-300: 4 units, 301-350: 4 units 351-400: 6 units 401-450: 6 units, >450 call MD   Insulin Pen Needle (PEN NEEDLES 31GX5/16") 31G X 8 MM MISC Use as directed with Humalog 3 times a day and at bedtime   LIVALO 2 MG TABS TAKE 1 TABLET DAILY (Patient taking differently: Take 2 mg by mouth at bedtime.)   losartan (COZAAR) 50 MG tablet TAKE 1 TABLET DAILY (Patient taking differently: Take 50 mg by mouth daily.)   Magnesium 100 MG CAPS Take 1 capsule (100 mg total) by mouth daily.   metoprolol tartrate (LOPRESSOR) 100 MG  tablet TAKE 1 TABLET TWICE A DAY (Patient taking differently: Take 100 mg by mouth 2 (two) times daily.)   mometasone (ELOCON) 0.1 % cream Apply 1 application topically daily. (Patient taking differently: Apply 1 application topically daily as needed (poison ivy).)   Multiple Vitamin (MULTIVITAMIN) tablet Take 1 tablet by mouth daily. BARIATRIC VIT   oxycodone (OXY-IR) 5 MG capsule Take 5 mg by mouth 2 (two) times daily.   pantoprazole (PROTONIX) 40 MG tablet Take 1 tablet (40 mg total) by mouth 2 (two) times daily before a meal.   Semaglutide,0.25 or 0.5MG /DOS, (OZEMPIC, 0.25 OR 0.5 MG/DOSE,) 2 MG/1.5ML SOPN Inject 0.2 mLs into the skin once a week.   sodium chloride (OCEAN) 0.65 % SOLN nasal spray Place 1 spray into both nostrils as needed for congestion (nose irritation).   Vitamin D, Ergocalciferol, (DRISDOL) 1.25 MG (50000 UNIT) CAPS capsule Take 1 capsule (50,000 Units total) by mouth every 7 (seven) days. TAKE 1 CAPSULE EVERY 7 DAYS   EPINEPHRINE 0.3 mg/0.3 mL IJ SOAJ injection INJECT 0.3 MLS (0.3 MG TOTAL) INTO THE MUSCLE ONCE FOR 1 DOSE (Patient not taking: Reported on 10/31/2020)   No facility-administered encounter medications on file as of 10/31/2020.     REVIEW OF SYSTEMS  : All other systems reviewed and negative except where noted in the History of Present Illness.   PHYSICAL EXAM: BP 108/60 (BP Location: Left Arm, Patient Position: Sitting, Cuff Size: Normal)   Pulse 76   Ht 5' 5.75" (1.67 m) Comment: height measured without shoes  Wt 256 lb 2 oz (116.2 kg)   BMI 41.65 kg/m  General: Well developed white male in no acute distress Head: Normocephalic and atraumatic Eyes:  Sclerae anicteric, conjunctiva pink. Ears: Normal auditory acuity Heart: Regular rate and rhythm; no M/R/G. Abdomen: Soft, non-distended.  BS present.  Non-tender. Rectal:  No abnormalities noted right at the anus.  There is a fistulous tract opening distal from the anus, posterior.  Not currently  draining.  Non-tender.  No abscess. Musculoskeletal: Symmetrical with no gross deformities  Skin: No lesions on visible extremities Extremities: No edema  Neurological: Alert oriented x 4, grossly non-focal Psychological:  Alert and cooperative. 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  Drains clear fluid and blood on occasion.  Will plan for MRI of the pelvis with and without contrast in order to delineate anatomy, etc.  Will refer him to surgery, he prefers Dr. Toney Rakes through [facility] who he has seem for a few other surgeries in the past.   CC:  [name], MD

## 2020-10-31 NOTE — Patient Instructions (Addendum)
If you are age 65 or older, your body mass index should be between 23-30. Your Body mass index is 41.65 kg/m. If this is out of the aforementioned range listed, please consider follow up with your Primary Care Provider. __________________________________________________________  The [place] [facility] providers would like to encourage you to use [facility] to communicate with providers for non-urgent requests or questions.  Due to long hold times on the telephone, sending your provider a message by [facility] may be a faster and more efficient way to get a response.  Please allow 48 business hours for a response.  Please remember that this is for non-urgent requests.   You have been scheduled for an MRI at [facility] on 11/06/2020. Your appointment time is 5:00 pm. Please arrive to admitting (at main entrance of the hospital) 30 minutes prior to your appointment time for registration purposes. Please make certain not to have anything to eat or drink 4 hours prior to your test. In addition, if you have any metal in your body, have a pacemaker or defibrillator, please be sure to let your ordering physician know. This test typically takes 45 minutes to 1 hour to complete. Should you need to reschedule, please call [phone] to do so.  We will be sending a referral request to Dr. Toney Rakes. Please allow up to 2 weeks for his office to contact you.  Follow up pending the results of your MRI or as needed.  Thank you for entrusting me with your care and choosing [facility].  Alonza Bogus, PA-C

## 2020-11-04 DIAGNOSIS — G4730 Sleep apnea, unspecified: Secondary | ICD-10-CM | POA: Diagnosis not present

## 2020-11-04 DIAGNOSIS — G4733 Obstructive sleep apnea (adult) (pediatric): Secondary | ICD-10-CM | POA: Diagnosis not present

## 2020-11-06 ENCOUNTER — Other Ambulatory Visit: Payer: Self-pay

## 2020-11-06 ENCOUNTER — Ambulatory Visit (HOSPITAL_COMMUNITY)
Admission: RE | Admit: 2020-11-06 | Discharge: 2020-11-06 | Disposition: A | Discharge status: Home / Self Care | Payer: Medicare Other | Source: Ambulatory Visit | Attending: Gastroenterology | Admitting: Gastroenterology

## 2020-11-06 DIAGNOSIS — K603 Anal fistula: Secondary | ICD-10-CM | POA: Diagnosis not present

## 2020-11-06 DIAGNOSIS — K625 Hemorrhage of anus and rectum: Secondary | ICD-10-CM | POA: Diagnosis not present

## 2020-11-06 MED ORDER — GADOBUTROL 1 MMOL/ML IV SOLN
10.0000 mL | Freq: Once | INTRAVENOUS | Status: AC | PRN
  Administered 2020-11-06: 10 mL via INTRAVENOUS

## 2020-11-11 IMAGING — CT CT ANGIOGRAPHY ABDOMEN AND PELVIS WITH CONTRAST AND WITHOUT CONT
3 of 10 series · 12 of 46 positions shown, 17 images · IV contrast (OMNIPAQUE)
Comparison: CT of the abdomen and pelvis without contrast on
12/07/2013 and with contrast on 03/24/2013

CLINICAL DATA: Rectal bleeding. History of Roux-en-Y gastric
bypass.

EXAM:
CT ANGIOGRAPHY ABDOMEN AND PELVIS WITH CONTRAST AND WITHOUT CONTRAST
TECHNIQUE: Multidetector CT imaging of the abdomen and pelvis was performed
using the standard protocol during bolus administration of
intravenous contrast. Multiplanar reconstructed images and MIPs were
obtained and reviewed to evaluate the vascular anatomy.
CONTRAST:  100mL OMNIPAQUE IOHEXOL 350 MG/ML SOLN

[Series 6: arterial · axial · arterial · 0.77mm/px · z∈[-853,-567]mm · 6 of 246 slices shown]
[im 21/246  soft-tissue]
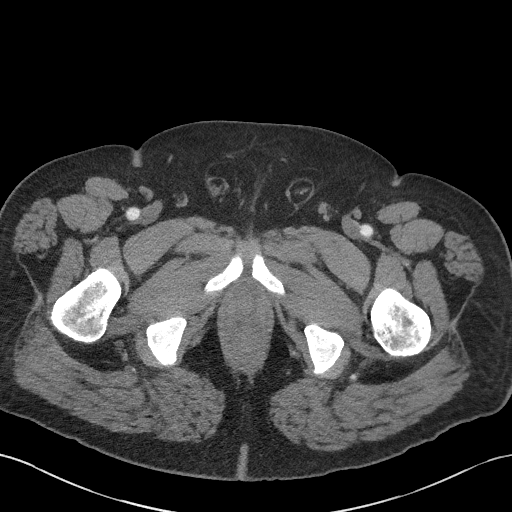
[im 62/246  soft-tissue]
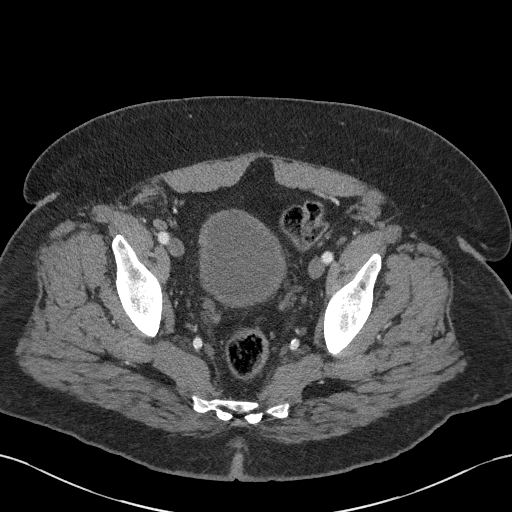
[im 82/246  soft-tissue]
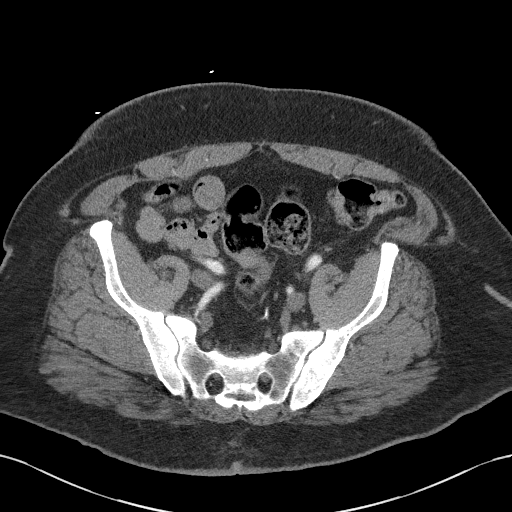
[im 103/246  soft-tissue]
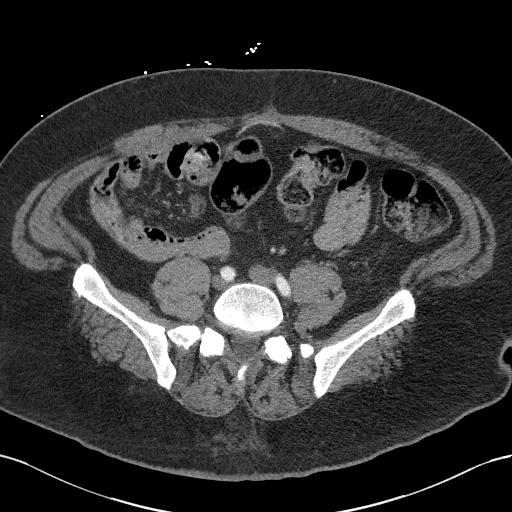
[im 143/246  soft-tissue]
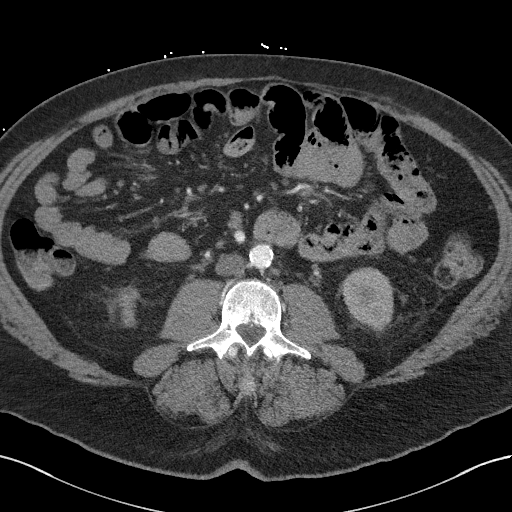
[im 164/246  soft-tissue]
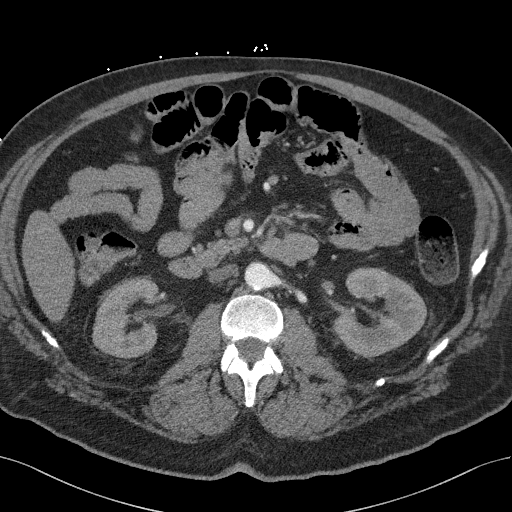

[Series 8: coronal art · coronal · 0.84mm/px · 3 of 151 slices shown, 4 images]
[im 38/151  soft-tissue]
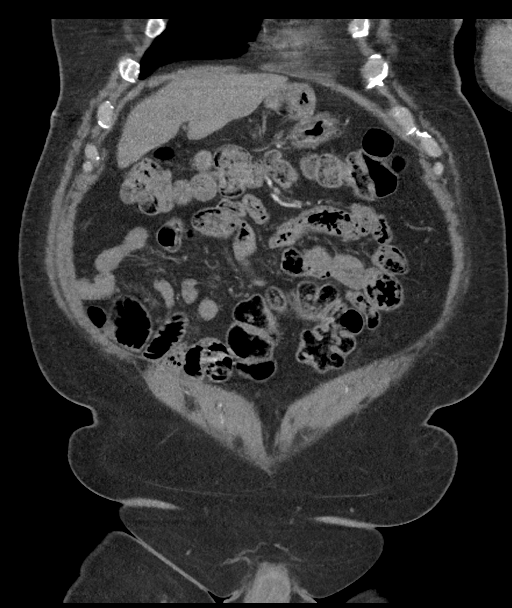
[im 76/151  soft-tissue]
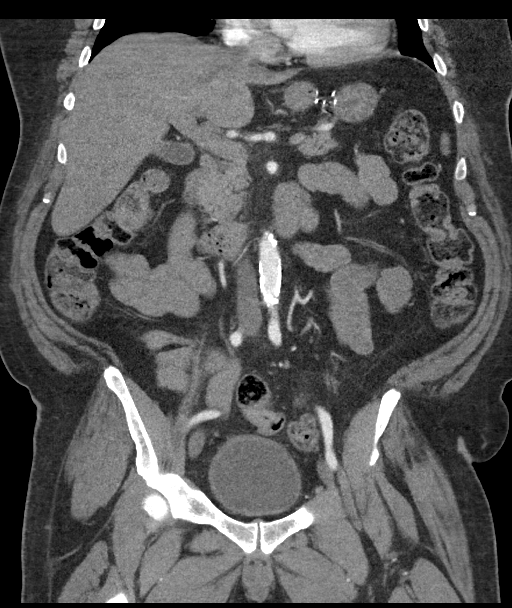
[im 76/151  bone]
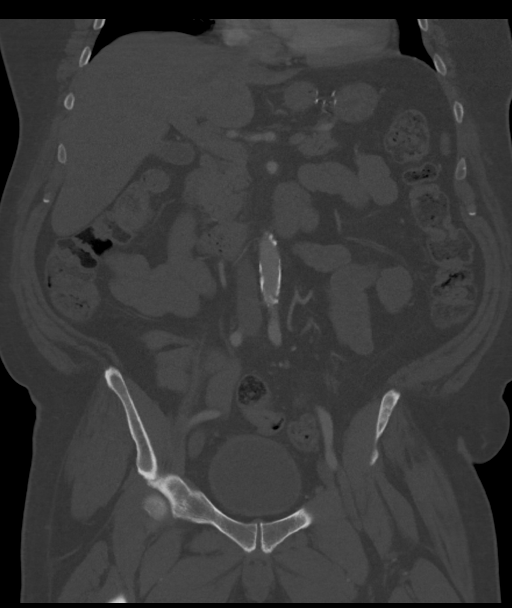
[im 113/151  soft-tissue]
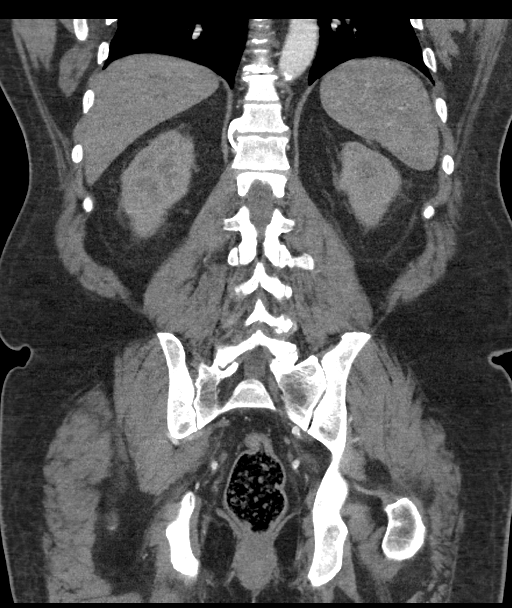

[Series 13: portal venous · axial · portal-venous · 0.77mm/px · z∈[-773,-528]mm · 3 of 99 slices shown, 7 images]
[im 25/99  soft-tissue]
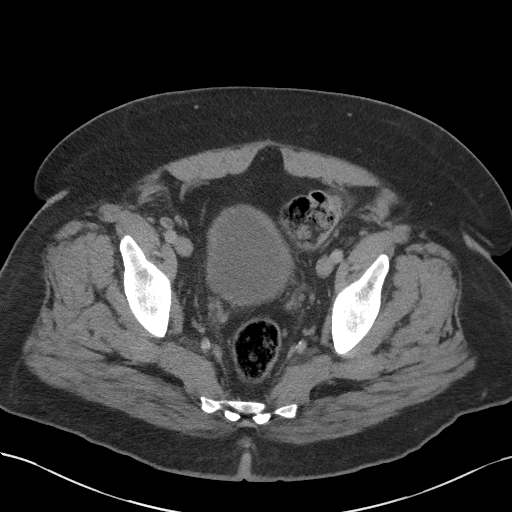
[im 25/99  lung]
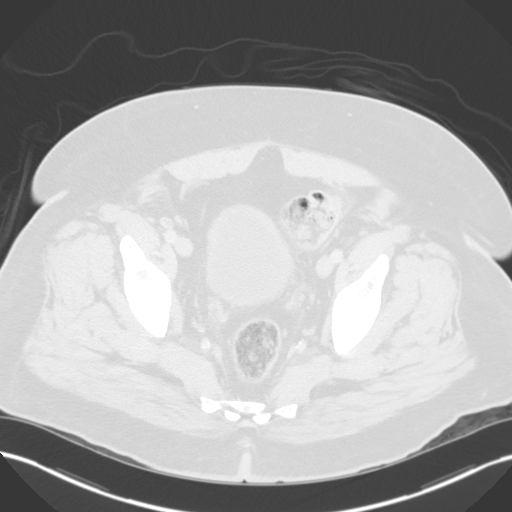
[im 25/99  bone]
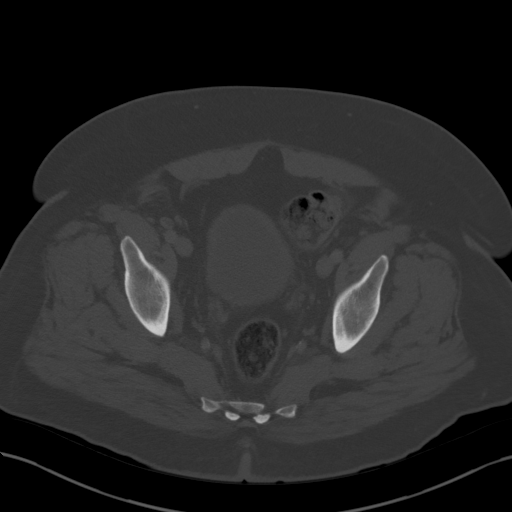
[im 50/99  soft-tissue]
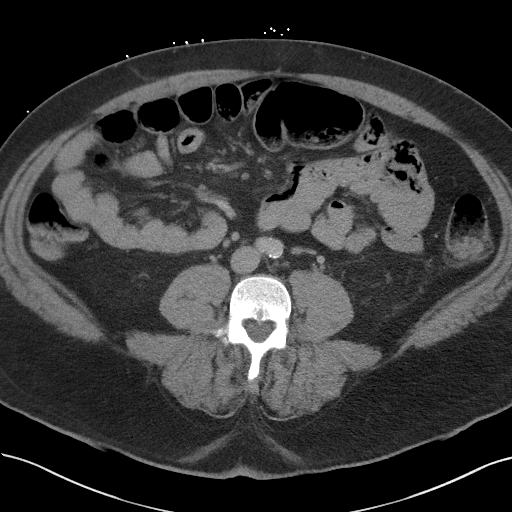
[im 50/99  lung]
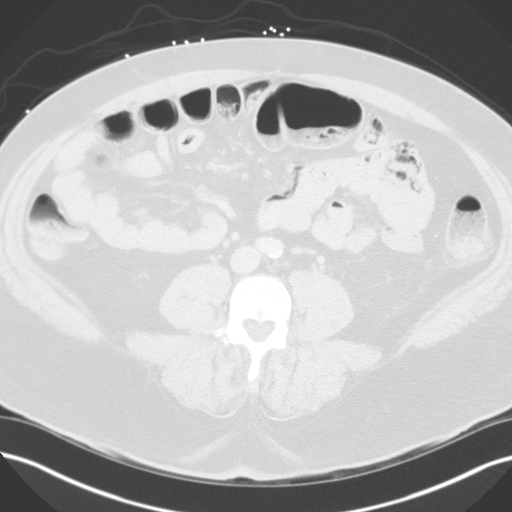
[im 74/99  soft-tissue]
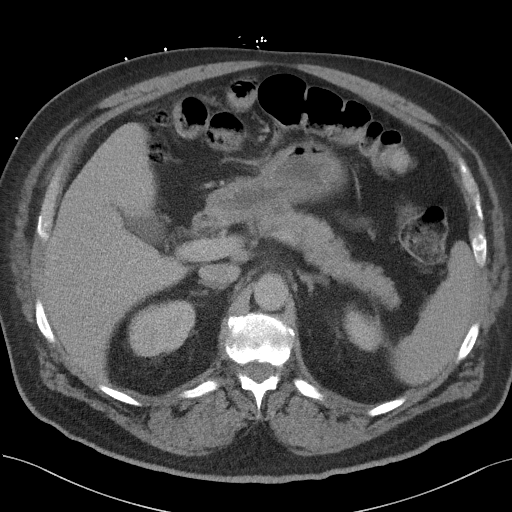
[im 74/99  lung]
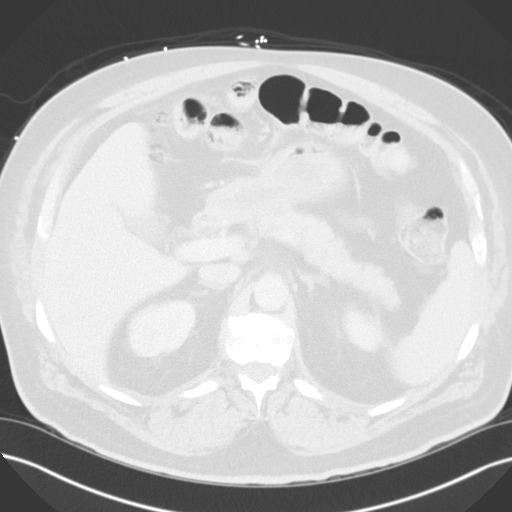

[12 of 46 positions shown; findings below may reference images not displayed]

FINDINGS: VASCULAR

Aorta: Normally patent without aneurysmal disease. Mild calcified
plaque in the distal aorta.

Celiac: Normally patent. Normal branch vessel anatomy. No active
bleeding identified at the level of the stomach or duodenum.

SMA: Normally patent. No distal branch vessel bleeding,
pseudoaneurysm or AV malformations identified.

Renals: Widely patent bilateral single renal arteries.

IMA: Normally patent. No distal branch vessel bleeding or vascular
abnormalities identified.

Inflow: Widely patent bilateral iliac arteries.

Proximal Outflow: Normally patent common femoral arteries and
femoral bifurcations.

Veins: Venous phase imaging demonstrates no venous abnormalities.
Portal vein, mesenteric veins and splenic vein are normally patent.
IVC, iliac veins and common femoral veins are normally patent.

Review of the MIP images confirms the above findings.

NON-VASCULAR

Lower chest: No acute abnormality.

Hepatobiliary: No focal liver abnormality is seen. No gallstones,
gallbladder wall thickening, or biliary dilatation.

Pancreas: Unremarkable. No pancreatic ductal dilatation or
surrounding inflammatory changes.

Spleen: Normal in size without focal abnormality.

Adrenals/Urinary Tract: Adrenal glands are unremarkable. Kidneys are
normal, without renal calculi, focal lesion, or hydronephrosis.
Bladder is unremarkable.

Stomach/Bowel: Bowel shows no evidence of obstruction, ileus or
inflammation. Status post Roux-en-Y gastric bypass without visible
complication. Additional small bowel anastomosis in the left mid
abdomen. No evidence of free air, focal abscess or free fluid. No
obvious lesions identified. No evidence of intraluminal contrast
extravasation on arterial or venous phases of imaging.

Lymphatic: No enlarged lymph nodes identified.

Reproductive: Prostate is unremarkable.

Other: No hernias identified.

Musculoskeletal: No acute or significant osseous findings.
IMPRESSION: VASCULAR

No evidence of active gastrointestinal bleeding by CTA during
arterial and delayed venous phases of imaging. No vascular
abnormalities are identified by CTA.

NON-VASCULAR

Status post Roux-en-Y gastric bypass as well as additional small
bowel resection. No evidence of acute process or lesion.

## 2020-12-03 DIAGNOSIS — Z794 Long term (current) use of insulin: Secondary | ICD-10-CM | POA: Diagnosis not present

## 2020-12-03 DIAGNOSIS — E1165 Type 2 diabetes mellitus with hyperglycemia: Secondary | ICD-10-CM | POA: Diagnosis not present

## 2020-12-05 DIAGNOSIS — G4730 Sleep apnea, unspecified: Secondary | ICD-10-CM | POA: Diagnosis not present

## 2020-12-05 DIAGNOSIS — G4733 Obstructive sleep apnea (adult) (pediatric): Secondary | ICD-10-CM | POA: Diagnosis not present

## 2020-12-10 ENCOUNTER — Other Ambulatory Visit: Payer: Self-pay | Admitting: Cardiology

## 2020-12-25 ENCOUNTER — Other Ambulatory Visit: Payer: Self-pay | Admitting: Family Medicine

## 2021-01-01 DIAGNOSIS — E1139 Type 2 diabetes mellitus with other diabetic ophthalmic complication: Secondary | ICD-10-CM | POA: Diagnosis not present

## 2021-01-01 DIAGNOSIS — H2513 Age-related nuclear cataract, bilateral: Secondary | ICD-10-CM | POA: Diagnosis not present

## 2021-01-01 LAB — HM DIABETES EYE EXAM

## 2021-01-05 DIAGNOSIS — G4730 Sleep apnea, unspecified: Secondary | ICD-10-CM | POA: Diagnosis not present

## 2021-01-05 DIAGNOSIS — G4733 Obstructive sleep apnea (adult) (pediatric): Secondary | ICD-10-CM | POA: Diagnosis not present

## 2021-01-09 DIAGNOSIS — Z8551 Personal history of malignant neoplasm of bladder: Secondary | ICD-10-CM | POA: Diagnosis not present

## 2021-01-13 ENCOUNTER — Encounter: Payer: Self-pay | Admitting: Family Medicine

## 2021-01-13 DIAGNOSIS — E538 Deficiency of other specified B group vitamins: Secondary | ICD-10-CM

## 2021-01-13 DIAGNOSIS — I10 Essential (primary) hypertension: Secondary | ICD-10-CM

## 2021-01-13 DIAGNOSIS — D500 Iron deficiency anemia secondary to blood loss (chronic): Secondary | ICD-10-CM

## 2021-01-13 DIAGNOSIS — Z9884 Bariatric surgery status: Secondary | ICD-10-CM

## 2021-01-13 DIAGNOSIS — E559 Vitamin D deficiency, unspecified: Secondary | ICD-10-CM

## 2021-01-13 DIAGNOSIS — E782 Mixed hyperlipidemia: Secondary | ICD-10-CM

## 2021-01-20 ENCOUNTER — Other Ambulatory Visit: Payer: Self-pay

## 2021-01-20 ENCOUNTER — Other Ambulatory Visit (INDEPENDENT_AMBULATORY_CARE_PROVIDER_SITE_OTHER): Payer: Medicare Other

## 2021-01-20 DIAGNOSIS — E782 Mixed hyperlipidemia: Secondary | ICD-10-CM | POA: Diagnosis not present

## 2021-01-20 DIAGNOSIS — Z9884 Bariatric surgery status: Secondary | ICD-10-CM | POA: Diagnosis not present

## 2021-01-20 DIAGNOSIS — D500 Iron deficiency anemia secondary to blood loss (chronic): Secondary | ICD-10-CM | POA: Diagnosis not present

## 2021-01-20 DIAGNOSIS — E559 Vitamin D deficiency, unspecified: Secondary | ICD-10-CM

## 2021-01-20 DIAGNOSIS — I10 Essential (primary) hypertension: Secondary | ICD-10-CM | POA: Diagnosis not present

## 2021-01-20 DIAGNOSIS — E538 Deficiency of other specified B group vitamins: Secondary | ICD-10-CM | POA: Diagnosis not present

## 2021-01-20 LAB — IBC + FERRITIN
Ferritin: 77.5 ng/mL (ref 22.0–322.0)
Iron: 71 ug/dL (ref 42–165)
Saturation Ratios: 17.3 % — ABNORMAL LOW (ref 20.0–50.0)
TIBC: 410.2 ug/dL (ref 250.0–450.0)
Transferrin: 293 mg/dL (ref 212.0–360.0)

## 2021-01-20 LAB — CBC WITH DIFFERENTIAL/PLATELET
Basophils Absolute: 0 10*3/uL (ref 0.0–0.1)
Basophils Relative: 0.6 % (ref 0.0–3.0)
Eosinophils Absolute: 0.1 10*3/uL (ref 0.0–0.7)
Eosinophils Relative: 3.1 % (ref 0.0–5.0)
HCT: 34.2 % — ABNORMAL LOW (ref 39.0–52.0)
Hemoglobin: 11.4 g/dL — ABNORMAL LOW (ref 13.0–17.0)
Lymphocytes Relative: 26.7 % (ref 12.0–46.0)
Lymphs Abs: 1 10*3/uL (ref 0.7–4.0)
MCHC: 33.4 g/dL (ref 30.0–36.0)
MCV: 88.2 fl (ref 78.0–100.0)
Monocytes Absolute: 0.4 10*3/uL (ref 0.1–1.0)
Monocytes Relative: 9.4 % (ref 3.0–12.0)
Neutro Abs: 2.3 10*3/uL (ref 1.4–7.7)
Neutrophils Relative %: 60.2 % (ref 43.0–77.0)
Platelets: 211 10*3/uL (ref 150.0–400.0)
RBC: 3.87 Mil/uL — ABNORMAL LOW (ref 4.22–5.81)
RDW: 14.7 % (ref 11.5–15.5)
WBC: 3.8 10*3/uL — ABNORMAL LOW (ref 4.0–10.5)

## 2021-01-20 LAB — B12 AND FOLATE PANEL
Folate: >23.4 ng/mL (ref >5.9)
Vitamin B-12: 506 pg/mL (ref 211–911)

## 2021-01-20 LAB — COMPREHENSIVE METABOLIC PANEL
ALT: 15 U/L (ref 0–53)
AST: 21 U/L (ref 0–37)
Albumin: 4.5 g/dL (ref 3.5–5.2)
Alkaline Phosphatase: 32 U/L — ABNORMAL LOW (ref 39–117)
BUN: 16 mg/dL (ref 6–23)
CO2: 27 mEq/L (ref 19–32)
Calcium: 9.2 mg/dL (ref 8.4–10.5)
Chloride: 106 mEq/L (ref 96–112)
Creatinine, Ser: 0.95 mg/dL (ref 0.40–1.50)
GFR: 84.12 mL/min (ref >60.00)
Glucose, Bld: 128 mg/dL — ABNORMAL HIGH (ref 70–99)
Potassium: 4.5 mEq/L (ref 3.5–5.1)
Sodium: 141 mEq/L (ref 135–145)
Total Bilirubin: 0.6 mg/dL (ref 0.2–1.2)
Total Protein: 6.2 g/dL (ref 6.0–8.3)

## 2021-01-20 LAB — LIPID PANEL
Cholesterol: 107 mg/dL (ref 0–200)
HDL: 40.8 mg/dL (ref >39.00)
LDL Cholesterol: 45 mg/dL (ref 0–99)
NonHDL: 66.56
Total CHOL/HDL Ratio: 3
Triglycerides: 107 mg/dL (ref 0.0–149.0)
VLDL: 21.4 mg/dL (ref 0.0–40.0)

## 2021-01-20 LAB — VITAMIN D 25 HYDROXY (VIT D DEFICIENCY, FRACTURES): VITD: 42.99 ng/mL (ref 30.00–100.00)

## 2021-01-22 LAB — ZINC: Zinc: 78 ug/dL (ref 60–130)

## 2021-01-23 ENCOUNTER — Other Ambulatory Visit: Payer: Self-pay

## 2021-01-23 ENCOUNTER — Ambulatory Visit (INDEPENDENT_AMBULATORY_CARE_PROVIDER_SITE_OTHER): Payer: Medicare Other | Admitting: Family Medicine

## 2021-01-23 VITALS — BP 132/74 | HR 90 | Temp 98.2°F | Resp 16 | Wt 263.2 lb

## 2021-01-23 DIAGNOSIS — E559 Vitamin D deficiency, unspecified: Secondary | ICD-10-CM

## 2021-01-23 DIAGNOSIS — M1A9XX0 Chronic gout, unspecified, without tophus (tophi): Secondary | ICD-10-CM | POA: Diagnosis not present

## 2021-01-23 DIAGNOSIS — I2510 Atherosclerotic heart disease of native coronary artery without angina pectoris: Secondary | ICD-10-CM

## 2021-01-23 DIAGNOSIS — E1100 Type 2 diabetes mellitus with hyperosmolarity without nonketotic hyperglycemic-hyperosmolar coma (NKHHC): Secondary | ICD-10-CM | POA: Diagnosis not present

## 2021-01-23 DIAGNOSIS — E538 Deficiency of other specified B group vitamins: Secondary | ICD-10-CM | POA: Diagnosis not present

## 2021-01-23 DIAGNOSIS — I10 Essential (primary) hypertension: Secondary | ICD-10-CM

## 2021-01-23 DIAGNOSIS — E782 Mixed hyperlipidemia: Secondary | ICD-10-CM | POA: Diagnosis not present

## 2021-01-23 MED ORDER — GLYBURIDE 5 MG PO TABS: 5.0000 mg | ORAL_TABLET | Freq: Every day | ORAL | 1 refills | Status: DC

## 2021-01-23 NOTE — Progress Notes (Signed)
Patient ID: Brent [name], male    DOB: Oct 12, 1955  Age: 65 y.o. MRN: [id number]    Subjective:   No chief complaint on file.  Subjective   HPI [name] presents for office visit today for follow up on HTN and type 2 diabetes. No signs of cancer coming back reported by his recent oncology visit. He is expressing worry in regards to his recent elevated A1C on 09/24/2020. He had his flu shot and PCV20 at the [facility] clinic. Denies CP/palp/SOB/HA/congestion/fevers/GI or GU c/o. Taking meds as prescribed. He is down to glyburide 5 mg once daily.   Review of Systems  Constitutional:  Negative for chills, fatigue and fever.  HENT:  Negative for congestion, rhinorrhea, sinus pressure, sinus pain and sore throat.   Eyes:  Negative for pain.  Respiratory:  Negative for cough and shortness of breath.   Cardiovascular:  Negative for chest pain, palpitations and leg swelling.  Gastrointestinal:  Negative for abdominal pain, blood in stool, diarrhea, nausea and vomiting.  Genitourinary:  Negative for flank pain, frequency and penile pain.  Musculoskeletal:  Negative for back pain.  Neurological:  Negative for headaches.   History Past Medical History:  Diagnosis Date   Acid reflux disease    ACID REFLUX DISEASE 07/05/2007   Acute gastric ulcer with bleeding    Alcohol abuse    Anemia    Atherosclerosis    Benign neoplasm of colon 07/05/2007   Bilateral hip pain 10/05/2016   Bladder cancer ([place])    Breast pain, left 11/17/2011   CAD (coronary artery disease)    Chest pain    a. Reportedly negative dobut echo performed prior to gastric bypass in 03/2011;  b. 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 12/2011 Cath: LM nl, LAD 50p, D1 2m, LCX min irregs, OM3 30, RCA 25p, 65m (FFR 0.99->0.89), PDA 30, EF 65%, Med Rx.   COLONIC POLYPS, HX OF 10/29/2008   Diarrhea 06/13/2010   Diverticulosis 07/05/2018   DIVERTICULOSIS, COLON 10/29/2008   DM (diabetes mellitus), type 2, uncontrolled  ([place])    GI bleed    Gout 03/04/2013   Hearing loss 05/24/2013   Previous audiology evaluation completely.   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   Neck pain 03/2015   Other and unspecified hyperlipidemia 11/16/2012   Post-operative nausea and vomiting    after the surgery in hospital in March 2020/ past the cauderization   Preventative health care 11/17/2011   Renal stone 11/2013   Sleep apnea    a. CPAP   Spinal stenosis    Tear of meniscus of left knee 2012    He has a past surgical history that includes Ankle surgery (Right, 1994); Tonsillectomy (age 19); colonoscopy polyps; Knee arthroscopy (Left, 11/06/2010); Gastric bypass; Hernia repair; Upper gi endoscopy (03/27/13); Esophagogastroduodenoscopy (N/A, 03/27/2013); Cardiac catheterization; left heart catheterization with coronary angiogram (N/A, 12/23/2011); right knee arthroscopy (Right, 07/05/14); Total knee arthroplasty (Right, 01/20/2016); Esophagogastroduodenoscopy (egd) with propofol (N/A, 07/06/2018); Schlerotherapy (07/06/2018); Hot hemostasis (N/A, 07/06/2018); Replacement total knee (Right, 01/2016); Rotator cuff repair (2019); Biceps tendon repair; Colonoscopy; Tonsillectomy; and Transurethral resection of bladder tumor (N/A, 09/24/2020).   His family history includes ADD / ADHD in his daughter;  Colon polyps in his father; Diabetes in his brother, brother, brother, mother, and sister; Heart attack in his brother and brother; Heart attack (age of onset: 42) in his father; Heart disease in his brother, brother, and brother; Hyperlipidemia in his mother; Hypertension in his father, maternal grandmother, and mother; Obesity in his brother; Stroke in his father, mother, and sister.He  reports that he quit smoking about 29 years ago. His smoking use included cigarettes. He has a 30.00 pack-year smoking history. He has never used smokeless tobacco. He reports current alcohol use of about 3.0 standard drinks per week. He reports that he does not use drugs.  Current Outpatient Medications on File Prior to Visit  Medication Sig Dispense Refill   acetaminophen (TYLENOL) 500 MG tablet Take 1,000 mg by mouth every 6 (six) hours as needed for moderate pain or headache.     amitriptyline (ELAVIL) 50 MG tablet Take 1 tablet (50 mg total) by mouth at bedtime. 90 tablet 3   cetirizine (ZYRTEC) 10 MG tablet Take 10 mg by mouth at bedtime.     Cholecalciferol (DIALYVITE VITAMIN D 5000) 125 MCG (5000 UT) capsule Take 5,000 Units by mouth daily.     Cyanocobalamin (VITAMIN B-12) 1000 MCG SUBL Place 1,000 mcg under the tongue once a week.     cyclobenzaprine (FLEXERIL) 10 MG tablet Take 1 tablet by mouth daily.     econazole nitrate 1 % cream Apply topically daily. (Patient taking differently: Apply 1 application topically daily as needed (fungus).) 30 g 1   EPINEPHRINE 0.3 mg/0.3 mL IJ SOAJ injection INJECT 0.3 MLS (0.3 MG TOTAL) INTO THE MUSCLE ONCE FOR 1 DOSE 2 each 11   famotidine (PEPCID) 40 MG tablet TAKE 1 TABLET AT BEDTIME AS NEEDED FOR HEARTBURN OR INDIGESTION (Patient taking differently: Take 40 mg by mouth daily as needed for heartburn.) 90 tablet 3   fenofibrate 160 MG tablet TAKE 1 TABLET DAILY 90 tablet 3   Ferrous Gluconate-C-Folic Acid (IRON-C PO) Take 1 tablet by mouth daily. Iron 65 mg and vit C-125 mg     gabapentin (NEURONTIN) 300 MG capsule TAKE 1 CAPSULE TWICE A DAY AND 2 CAPSULES AT BEDTIME (Patient taking differently: Take 300-600 mg by mouth See admin instructions. Take 300 mg twice daily and 600 mg at bedtime) 360 capsule 3   glucose blood (FREESTYLE LITE) test strip Use to check blood sugar 4 times day.  Dx Code: E11.9 400 each 1   insulin lispro (HUMALOG) 100 UNIT/ML  KwikPen Inject 2-6 Units into the skin 3 (three) times daily. Per sliding scale. 15 mL 0   Insulin Pen Needle (PEN NEEDLES 31GX5/16") 31G X 8 MM MISC Use as directed with Humalog 3 times a day and at bedtime 100 each 0   LIVALO 2 MG TABS TAKE 1 TABLET DAILY (Patient taking differently: Take 2 mg by mouth at bedtime.) 90 tablet 3   losartan (COZAAR) 50 MG tablet Take 1 tablet (50 mg total) by mouth daily. 90 tablet 1   Magnesium 100 MG CAPS Take 1 capsule (100 mg total) by mouth daily. 30 capsule 1   metoprolol tartrate (LOPRESSOR) 100 MG tablet TAKE 1 TABLET TWICE A DAY (Patient taking differently: Take 100 mg by mouth 2 (two) times daily.) 180 tablet 3   mometasone (ELOCON) 0.1 % cream Apply 1 application topically daily. (Patient taking differently: Apply 1 application topically daily as needed (poison ivy).) 90 g 1   Multiple Vitamin (MULTIVITAMIN) tablet Take 1 tablet by  mouth daily. BARIATRIC VIT     oxycodone (OXY-IR) 5 MG capsule Take 5 mg by mouth 2 (two) times daily.     pantoprazole (PROTONIX) 40 MG tablet Take 1 tablet (40 mg total) by mouth 2 (two) times daily before a meal. 180 tablet 1   sodium chloride (OCEAN) 0.65 % SOLN nasal spray Place 1 spray into both nostrils as needed for congestion (nose irritation). 30 mL 0   Vitamin D, Ergocalciferol, (DRISDOL) 1.25 MG (50000 UNIT) CAPS capsule Take 1 capsule (50,000 Units total) by mouth every 7 (seven) days. TAKE 1 CAPSULE EVERY 7 DAYS 12 capsule 1   No current facility-administered medications on file prior to visit.     Objective:  Objective  Physical Exam Constitutional:      General: He is not in acute distress.    Appearance: Normal appearance. He is not ill-appearing or toxic-appearing.  HENT:     Head: Normocephalic and atraumatic.     Right Ear: Tympanic membrane, ear canal and external ear normal.     Left Ear: Tympanic membrane, ear canal and external ear normal.     Nose: No congestion or rhinorrhea.  Eyes:      Extraocular Movements: Extraocular movements intact.     Pupils: Pupils are equal, round, and reactive to light.  Cardiovascular:     Rate and Rhythm: Normal rate and regular rhythm.     Pulses: Normal pulses.     Heart sounds: Normal heart sounds. No murmur heard. Pulmonary:     Effort: Pulmonary effort is normal. No respiratory distress.     Breath sounds: Normal breath sounds. No wheezing, rhonchi or rales.  Abdominal:     General: Bowel sounds are normal.     Palpations: Abdomen is soft. There is no mass.     Tenderness: There is no abdominal tenderness. There is no guarding.     Hernia: No hernia is present.  Musculoskeletal:        General: Normal range of motion.     Cervical back: Normal range of motion and neck supple.  Skin:    General: Skin is warm and dry.  Neurological:     Mental Status: He is alert and oriented to person, place, and time.  Psychiatric:        Behavior: Behavior normal.   BP 132/74   Pulse 90   Temp 98.2 F (36.8 C)   Resp 16   Wt 263 lb 3.2 oz (119.4 kg)   SpO2 97%   BMI 42.81 kg/m  Wt Readings from Last 3 Encounters:  01/23/21 263 lb 3.2 oz (119.4 kg)  10/31/20 256 lb 2 oz (116.2 kg)  10/01/20 246 lb (111.6 kg)     Lab Results  Component Value Date   WBC 3.8 (L) 01/20/2021   HGB 11.4 (L) 01/20/2021   HCT 34.2 (L) 01/20/2021   PLT 211.0 01/20/2021   GLUCOSE 128 (H) 01/20/2021   CHOL 107 01/20/2021   TRIG 107.0 01/20/2021   HDL 40.80 01/20/2021   LDLDIRECT 85.0 11/17/2016   LDLCALC 45 01/20/2021   ALT 15 01/20/2021   AST 21 01/20/2021   NA 141 01/20/2021   K 4.5 01/20/2021   CL 106 01/20/2021   CREATININE 0.95 01/20/2021   BUN 16 01/20/2021   CO2 27 01/20/2021   TSH 1.44 01/11/2020   PSA 1.55 06/25/2017   INR 1.3 (H) 07/06/2018   HGBA1C 8.0 (H) 09/24/2020   MICROALBUR 1.1 12/02/2015    MR  PELVIS W WO CONTRAST  Result Date: 11/08/2020 CLINICAL DATA:  Bloody drainage from anus. Suspected perianal fistula. EXAM: MRI  PELVIS WITHOUT AND WITH CONTRAST TECHNIQUE: Multiplanar multisequence MR imaging of the pelvis was performed both before and after administration of intravenous contrast. CONTRAST:  37mL GADAVIST GADOBUTROL 1 MMOL/ML IV SOLN COMPARISON:  None. FINDINGS: Lower Urinary Tract: Unremarkable urinary bladder. Bowel: No evidence of perianal fistula or abscess. Rectum and other pelvic bowel loops are unremarkable. Vascular/Lymphatic: No pathologically enlarged lymph nodes or other significant abnormality seen in lower pelvis. Reproductive:  No mass or other significant abnormality. Other: None. Musculoskeletal: No significant abnormality identified. IMPRESSION: Negative. No evidence of perianal fistula or abscess. Electronically Signed   By: Marlaine Hind M.D.   On: 11/08/2020 08:36     Assessment & Plan:  Plan    Meds ordered this encounter  Medications   glyBURIDE (DIABETA) 5 MG tablet    Sig: Take 1 tablet (5 mg total) by mouth daily.    Dispense:  90 tablet    Refill:  1     Problem List Items Addressed This Visit     T2DM (type 2 diabetes mellitus) ([place])    hgba1c acceptable, minimize simple carbs. Increase exercise as tolerated. Continue current meds. following with endocrinology      Relevant Medications   glyBURIDE (DIABETA) 5 MG tablet   Essential hypertension - Primary    Well controlled, no changes to meds. Encouraged heart healthy diet such as the DASH diet and exercise as tolerated.       Relevant Orders   CBC   Comprehensive metabolic panel   TSH   CAD (coronary artery disease)   Hyperlipidemia, mixed   Relevant Orders   Lipid panel   Gout    No recent flares. Hydrate and monitor      Relevant Orders   Uric acid   Vitamin D deficiency    Labs reveal deficiency. Start on Vitamin D 50000 IU caps, 1 cap po weekly x 12 weeks. Disp #4 with 4 rf. Also take daily Vitamin D over the counter. If already taking a daily supplement increase by 1000 IU daily and if not start  Vitamin D 2000 IU daily.       Relevant Orders   VITAMIN D 25 Hydroxy (Vit-D Deficiency, Fractures)   Vitamin B12 deficiency    Supplement and monitor       Follow-up: Return in about 4 months (around 05/26/2021), or lab appt 2/3 then v/u visit within the week and CPE after 10/01/2020, for f/u vv, then annual CPE 4 months after around June 2023.  I, Suezanne Jacquet, acting as a scribe for [name], MD, have documented all relevent documentation on behalf of [name], MD, as directed by [name], MD while in the presence of [name], MD. DO:01/26/21.  I, [name], MD personally performed the services described in this documentation. All medical record entries made by the scribe were at my direction and in my presence. I have reviewed the chart and agree that the record reflects my personal performance and is accurate and complete

## 2021-01-23 NOTE — Patient Instructions (Addendum)
-   Bivalent covid shot is available at the [facility] downstairs Mon-Fri, 9am-3pm and they do walk-ins.  - Molnupiravir/Paxlovid are the new COVID medications we can give you if you get COVID so make sure you test if you have symptoms because we have to treat by day 5 of symptoms for it to be effective. If you are positive let us know so we can treat. If a home test is negative and your symptoms are persistent get a PCR test. Can check testing locations at [email or web address] If you are positive we will make an appointment with [place] and we will send in Paxlovid/molnupiravir if you would like it. Check with your pharmacy before we meet to confirm they have it in stock, if they do not then we can get the prescription at the [facility].

## 2021-01-25 LAB — VITAMIN B1: Vitamin B1 (Thiamine): 20 nmol/L (ref 8–30)

## 2021-01-26 NOTE — Assessment & Plan Note (Signed)
Well controlled, no changes to meds. Encouraged heart healthy diet such as the DASH diet and exercise as tolerated.  °

## 2021-01-26 NOTE — Assessment & Plan Note (Signed)
No recent flares. Hydrate and monitor 

## 2021-01-26 NOTE — Assessment & Plan Note (Signed)
Labs reveal deficiency. Start on Vitamin D 50000 IU caps, 1 cap po weekly x 12 weeks. Disp #4 with 4 rf. Also take daily Vitamin D over the counter. If already taking a daily supplement increase by 1000 IU daily and if not start Vitamin D 2000 IU daily.  

## 2021-01-26 NOTE — Assessment & Plan Note (Signed)
Supplement and monitor 

## 2021-01-26 NOTE — Assessment & Plan Note (Signed)
hgba1c acceptable, minimize simple carbs. Increase exercise as tolerated. Continue current meds. following with endocrinology

## 2021-02-04 DIAGNOSIS — G4733 Obstructive sleep apnea (adult) (pediatric): Secondary | ICD-10-CM | POA: Diagnosis not present

## 2021-02-04 DIAGNOSIS — G4730 Sleep apnea, unspecified: Secondary | ICD-10-CM | POA: Diagnosis not present

## 2021-02-19 DIAGNOSIS — Z79891 Long term (current) use of opiate analgesic: Secondary | ICD-10-CM | POA: Diagnosis not present

## 2021-02-24 ENCOUNTER — Inpatient Hospital Stay: Payer: Medicare Other | Attending: Hematology & Oncology

## 2021-02-24 ENCOUNTER — Other Ambulatory Visit: Payer: Self-pay

## 2021-02-24 ENCOUNTER — Inpatient Hospital Stay (HOSPITAL_BASED_OUTPATIENT_CLINIC_OR_DEPARTMENT_OTHER): Payer: Medicare Other | Admitting: Family

## 2021-02-24 ENCOUNTER — Encounter: Payer: Self-pay | Admitting: Family

## 2021-02-24 VITALS — BP 122/52 | HR 93 | Temp 98.2°F | Resp 18 | Wt 262.0 lb

## 2021-02-24 DIAGNOSIS — C679 Malignant neoplasm of bladder, unspecified: Secondary | ICD-10-CM | POA: Diagnosis not present

## 2021-02-24 DIAGNOSIS — D500 Iron deficiency anemia secondary to blood loss (chronic): Secondary | ICD-10-CM | POA: Diagnosis not present

## 2021-02-24 DIAGNOSIS — Z9221 Personal history of antineoplastic chemotherapy: Secondary | ICD-10-CM | POA: Diagnosis not present

## 2021-02-24 DIAGNOSIS — K909 Intestinal malabsorption, unspecified: Secondary | ICD-10-CM | POA: Diagnosis not present

## 2021-02-24 DIAGNOSIS — Z8719 Personal history of other diseases of the digestive system: Secondary | ICD-10-CM | POA: Diagnosis not present

## 2021-02-24 DIAGNOSIS — Z79899 Other long term (current) drug therapy: Secondary | ICD-10-CM | POA: Insufficient documentation

## 2021-02-24 LAB — CBC WITH DIFFERENTIAL (CANCER CENTER ONLY)
Abs Immature Granulocytes: 0.01 10*3/uL (ref 0.00–0.07)
Basophils Absolute: 0 10*3/uL (ref 0.0–0.1)
Basophils Relative: 1 %
Eosinophils Absolute: 0.2 10*3/uL (ref 0.0–0.5)
Eosinophils Relative: 3 %
HCT: 37.2 % — ABNORMAL LOW (ref 39.0–52.0)
Hemoglobin: 12.4 g/dL — ABNORMAL LOW (ref 13.0–17.0)
Immature Granulocytes: 0 %
Lymphocytes Relative: 25 %
Lymphs Abs: 1.3 10*3/uL (ref 0.7–4.0)
MCH: 30.3 pg (ref 26.0–34.0)
MCHC: 33.3 g/dL (ref 30.0–36.0)
MCV: 91 fL (ref 80.0–100.0)
Monocytes Absolute: 0.5 10*3/uL (ref 0.1–1.0)
Monocytes Relative: 10 %
Neutro Abs: 3 10*3/uL (ref 1.7–7.7)
Neutrophils Relative %: 61 %
Platelet Count: 202 10*3/uL (ref 150–400)
RBC: 4.09 MIL/uL — ABNORMAL LOW (ref 4.22–5.81)
RDW: 13.1 % (ref 11.5–15.5)
WBC Count: 4.9 10*3/uL (ref 4.0–10.5)
nRBC: 0 % (ref 0.0–0.2)

## 2021-02-24 LAB — RETICULOCYTES
Immature Retic Fract: 12.7 % (ref 2.3–15.9)
RBC.: 4.07 MIL/uL — ABNORMAL LOW (ref 4.22–5.81)
Retic Count, Absolute: 92 10*3/uL (ref 19.0–186.0)
Retic Ct Pct: 2.3 % (ref 0.4–3.1)

## 2021-02-24 NOTE — Progress Notes (Signed)
Hematology and Oncology Follow Up Visit  Brent [name] [id number] 12/19/1955 65 y.o. 02/24/2021   Principle Diagnosis:  Iron deficiency anemia secondary to malabsorption and history of acute GI bleed   Current Therapy:        IV iron as indicated    Interim History:  Brent [name] is here today for follow-up. He is doing well and has no complaints at this time.  He TURBT with post op gemcitabine for bladder cancer. He states that he did well with treatment and is now on 6 month observation with Urology.  He has had some drainage from the rectum. MRI was negative and he states that his gastroenterologist said he could refer him to a surgeon if needed. The patient states that it is intermittent and tolerable at this time.  No blood loss noted. No bruising or petechiae.  No fever, chills, n/v, cough, rash, dizziness, SOB, chest pain, palpitations, abdominal pain or changes in bowel or bladder habits.  No swelling, tenderness, numbness or tingling in his extremities. No falls or syncope to report.  He has maintained a good appetite and is staying well hydrated. His weight is stable at 262 lbs.   ECOG Performance Status: 0 - Asymptomatic  Medications:  Allergies as of 02/24/2021       Reactions   Bee Venom Anaphylaxis   Lipitor [atorvastatin] Other (See Comments)   Myalgias, memory changes   Morphine Other (See Comments)   hyperactive   Metformin And Related Other (See Comments)   Myalgias and weakness   Baclofen    Acted drunk   Medrol [methylprednisolone]    Hyperglycemia    Simvastatin    Joint pain   Hydrocodone Itching   Pravastatin Other (See Comments)   Made joints        Medication List        Accurate as of February 24, 2021 10:31 AM. If you have any questions, ask your nurse or doctor.          acetaminophen 500 MG tablet Commonly known as: TYLENOL Take 1,000 mg by mouth every 6 (six) hours as needed for moderate pain or headache.   amitriptyline 50 MG  tablet Commonly known as: ELAVIL Take 1 tablet (50 mg total) by mouth at bedtime.   cetirizine 10 MG tablet Commonly known as: ZYRTEC Take 10 mg by mouth at bedtime.   cyclobenzaprine 10 MG tablet Commonly known as: FLEXERIL Take 1 tablet by mouth daily.   Dialyvite Vitamin D 5000 125 MCG (5000 UT) capsule Generic drug: Cholecalciferol Take 5,000 Units by mouth daily.   econazole nitrate 1 % cream Apply topically daily. What changed:  how much to take when to take this reasons to take this   EPINEPHrine 0.3 mg/0.3 mL Soaj injection Commonly known as: EPI-PEN INJECT 0.3 MLS (0.3 MG TOTAL) INTO THE MUSCLE ONCE FOR 1 DOSE   famotidine 40 MG tablet Commonly known as: PEPCID TAKE 1 TABLET AT BEDTIME AS NEEDED FOR HEARTBURN OR INDIGESTION What changed: See the new instructions.   fenofibrate 160 MG tablet TAKE 1 TABLET DAILY   FREESTYLE LITE test strip Generic drug: glucose blood Use to check blood sugar 4 times day.  Dx Code: E11.9   gabapentin 300 MG capsule Commonly known as: NEURONTIN TAKE 1 CAPSULE TWICE A DAY AND 2 CAPSULES AT BEDTIME What changed:  how much to take how to take this when to take this additional instructions   glyBURIDE 5 MG tablet Commonly known as:  DIABETA Take 1 tablet (5 mg total) by mouth daily.   insulin lispro 100 UNIT/ML KwikPen Commonly known as: HUMALOG Inject 2-6 Units into the skin 3 (three) times daily. Per sliding scale.   IRON-C PO Take 1 tablet by mouth daily. Iron 65 mg and vit C-125 mg   Livalo 2 MG Tabs Generic drug: Pitavastatin Calcium TAKE 1 TABLET DAILY What changed:  how much to take when to take this   losartan 50 MG tablet Commonly known as: COZAAR Take 1 tablet (50 mg total) by mouth daily.   Magnesium 100 MG Caps Take 1 capsule (100 mg total) by mouth daily.   metoprolol tartrate 100 MG tablet Commonly known as: LOPRESSOR TAKE 1 TABLET TWICE A DAY   mometasone 0.1 % cream Commonly known as:  Elocon Apply 1 application topically daily. What changed:  when to take this reasons to take this   multivitamin tablet Take 1 tablet by mouth daily. BARIATRIC VIT   oxycodone 5 MG capsule Commonly known as: OXY-IR Take 1 mg by mouth 3 (three) times daily. PRN   pantoprazole 40 MG tablet Commonly known as: PROTONIX Take 1 tablet (40 mg total) by mouth 2 (two) times daily before a meal.   PEN NEEDLES 31GX5/16" 31G X 8 MM Misc Use as directed with Humalog 3 times a day and at bedtime   sodium chloride 0.65 % Soln nasal spray Commonly known as: OCEAN Place 1 spray into both nostrils as needed for congestion (nose irritation).   Vitamin B-12 1000 MCG Subl Place 1,000 mcg under the tongue once a week.   Vitamin D (Ergocalciferol) 1.25 MG (50000 UNIT) Caps capsule Commonly known as: DRISDOL Take 1 capsule (50,000 Units total) by mouth every 7 (seven) days. TAKE 1 CAPSULE EVERY 7 DAYS        Allergies:  Allergies  Allergen Reactions   Bee Venom Anaphylaxis   Lipitor [Atorvastatin] Other (See Comments)    Myalgias, memory changes   Morphine Other (See Comments)    hyperactive   Metformin And Related Other (See Comments)    Myalgias and weakness   Baclofen     Acted drunk   Medrol [Methylprednisolone]     Hyperglycemia    Simvastatin     Joint pain   Hydrocodone Itching   Pravastatin Other (See Comments)    Made joints    Past Medical History, Surgical history, Social history, and Family History were reviewed and updated.  Review of Systems: All other 10 point review of systems is negative.   Physical Exam:  weight is 262 lb (118.8 kg). His oral temperature is 98.2 F (36.8 C). His blood pressure is 122/52 (abnormal) and his pulse is 93. His respiration is 18 and oxygen saturation is 98%.   Wt Readings from Last 3 Encounters:  02/24/21 262 lb (118.8 kg)  01/23/21 263 lb 3.2 oz (119.4 kg)  10/31/20 256 lb 2 oz (116.2 kg)    Ocular: Sclerae unicteric,  pupils equal, round and reactive to light Ear-nose-throat: Oropharynx clear, dentition fair Lymphatic: No cervical or supraclavicular adenopathy Lungs no rales or rhonchi, good excursion bilaterally Heart regular rate and rhythm, no murmur appreciated Abd soft, nontender, positive bowel sounds MSK no focal spinal tenderness, no joint edema Neuro: non-focal, well-oriented, appropriate affect Breasts: Deferred   Lab Results  Component Value Date   WBC 4.9 02/24/2021   HGB 12.4 (L) 02/24/2021   HCT 37.2 (L) 02/24/2021   MCV 91.0 02/24/2021   PLT 202  02/24/2021   Lab Results  Component Value Date   FERRITIN 77.5 01/20/2021   IRON 71 01/20/2021   TIBC 410.2 01/20/2021   UIBC 279 08/23/2020   IRONPCTSAT 17.3 (L) 01/20/2021   Lab Results  Component Value Date   RETICCTPCT 2.3 02/24/2021   RBC 4.07 (L) 02/24/2021   No results found for: KPAFRELGTCHN, LAMBDASER, KAPLAMBRATIO No results found for: Kandis Cocking, IGMSERUM No results found for: Odetta Pink, SPEI   Chemistry      Component Value Date/Time   NA 141 01/20/2021 0742   NA 137 07/18/2019 1454   K 4.5 01/20/2021 0742   CL 106 01/20/2021 0742   CO2 27 01/20/2021 0742   BUN 16 01/20/2021 0742   BUN 13 07/18/2019 1454   CREATININE 0.95 01/20/2021 0742   CREATININE 1.19 01/11/2020 1543      Component Value Date/Time   CALCIUM 9.2 01/20/2021 0742   ALKPHOS 32 (L) 01/20/2021 0742   AST 21 01/20/2021 0742   AST 15 07/14/2018 1021   ALT 15 01/20/2021 0742   ALT 11 07/14/2018 1021   BILITOT 0.6 01/20/2021 0742   BILITOT 0.4 07/14/2018 1021       Impression and Plan: Mr. Allred is a very pleasant 65 yo caucasian gentleman with iron deficiency anemia secondary to malabsorption (gastric bypass) as well intermittent GI blood loss with ulcer.  He is doing much better and tolerated treatment for bladder cancer nicely. He continues to follow-up regularly with Urology.   Iron studies are pending. We will replace if needed.  Follow-up in 6 months.  He can contact our office with any questions or concerns.   Lottie Dawson, NP 11/7/202210:31 AM

## 2021-02-25 LAB — IRON AND TIBC
Iron: 136 ug/dL (ref 42–163)
Saturation Ratios: 37 % (ref 20–55)
TIBC: 371 ug/dL (ref 202–409)
UIBC: 236 ug/dL (ref 117–376)

## 2021-02-25 LAB — FERRITIN: Ferritin: 93 ng/mL (ref 24–336)

## 2021-03-05 DIAGNOSIS — Z7984 Long term (current) use of oral hypoglycemic drugs: Secondary | ICD-10-CM | POA: Diagnosis not present

## 2021-03-05 DIAGNOSIS — E119 Type 2 diabetes mellitus without complications: Secondary | ICD-10-CM | POA: Diagnosis not present

## 2021-03-05 DIAGNOSIS — Z794 Long term (current) use of insulin: Secondary | ICD-10-CM | POA: Diagnosis not present

## 2021-03-05 DIAGNOSIS — Z7985 Long-term (current) use of injectable non-insulin antidiabetic drugs: Secondary | ICD-10-CM | POA: Diagnosis not present

## 2021-03-06 DIAGNOSIS — G4730 Sleep apnea, unspecified: Secondary | ICD-10-CM | POA: Diagnosis not present

## 2021-03-06 DIAGNOSIS — G4733 Obstructive sleep apnea (adult) (pediatric): Secondary | ICD-10-CM | POA: Diagnosis not present

## 2021-03-07 DIAGNOSIS — G4733 Obstructive sleep apnea (adult) (pediatric): Secondary | ICD-10-CM | POA: Diagnosis not present

## 2021-03-07 DIAGNOSIS — G4730 Sleep apnea, unspecified: Secondary | ICD-10-CM | POA: Diagnosis not present

## 2021-03-13 ENCOUNTER — Other Ambulatory Visit: Payer: Self-pay | Admitting: Family Medicine

## 2021-03-17 ENCOUNTER — Other Ambulatory Visit: Payer: Self-pay | Admitting: Family Medicine

## 2021-03-17 ENCOUNTER — Encounter: Payer: Self-pay | Admitting: Family Medicine

## 2021-03-17 MED ORDER — METHOCARBAMOL 500 MG PO TABS: 500.0000 mg | ORAL_TABLET | Freq: Two times a day (BID) | ORAL | 2 refills | Status: DC | PRN

## 2021-04-06 DIAGNOSIS — G4733 Obstructive sleep apnea (adult) (pediatric): Secondary | ICD-10-CM | POA: Diagnosis not present

## 2021-04-06 DIAGNOSIS — G4730 Sleep apnea, unspecified: Secondary | ICD-10-CM | POA: Diagnosis not present

## 2021-04-29 ENCOUNTER — Encounter: Payer: Self-pay | Admitting: Family

## 2021-04-30 ENCOUNTER — Other Ambulatory Visit: Payer: Self-pay | Admitting: *Deleted

## 2021-04-30 ENCOUNTER — Encounter: Payer: Self-pay | Admitting: Family

## 2021-04-30 ENCOUNTER — Other Ambulatory Visit: Payer: Self-pay | Admitting: Family Medicine

## 2021-04-30 DIAGNOSIS — D500 Iron deficiency anemia secondary to blood loss (chronic): Secondary | ICD-10-CM

## 2021-04-30 DIAGNOSIS — K909 Intestinal malabsorption, unspecified: Secondary | ICD-10-CM

## 2021-05-01 ENCOUNTER — Inpatient Hospital Stay: Payer: Medicare Other | Attending: Hematology & Oncology

## 2021-05-01 ENCOUNTER — Other Ambulatory Visit: Payer: Self-pay

## 2021-05-01 DIAGNOSIS — K909 Intestinal malabsorption, unspecified: Secondary | ICD-10-CM | POA: Insufficient documentation

## 2021-05-01 DIAGNOSIS — Z9884 Bariatric surgery status: Secondary | ICD-10-CM | POA: Insufficient documentation

## 2021-05-01 DIAGNOSIS — D508 Other iron deficiency anemias: Secondary | ICD-10-CM | POA: Diagnosis not present

## 2021-05-01 DIAGNOSIS — D500 Iron deficiency anemia secondary to blood loss (chronic): Secondary | ICD-10-CM

## 2021-05-01 LAB — CBC WITH DIFFERENTIAL (CANCER CENTER ONLY)
Abs Immature Granulocytes: 0.06 10*3/uL (ref 0.00–0.07)
Basophils Absolute: 0 10*3/uL (ref 0.0–0.1)
Basophils Relative: 1 %
Eosinophils Absolute: 0.1 10*3/uL (ref 0.0–0.5)
Eosinophils Relative: 3 %
HCT: 37.6 % — ABNORMAL LOW (ref 39.0–52.0)
Hemoglobin: 12.3 g/dL — ABNORMAL LOW (ref 13.0–17.0)
Immature Granulocytes: 1 %
Lymphocytes Relative: 26 %
Lymphs Abs: 1.2 10*3/uL (ref 0.7–4.0)
MCH: 29.6 pg (ref 26.0–34.0)
MCHC: 32.7 g/dL (ref 30.0–36.0)
MCV: 90.4 fL (ref 80.0–100.0)
Monocytes Absolute: 0.5 10*3/uL (ref 0.1–1.0)
Monocytes Relative: 10 %
Neutro Abs: 2.8 10*3/uL (ref 1.7–7.7)
Neutrophils Relative %: 59 %
Platelet Count: 220 10*3/uL (ref 150–400)
RBC: 4.16 MIL/uL — ABNORMAL LOW (ref 4.22–5.81)
RDW: 13.1 % (ref 11.5–15.5)
WBC Count: 4.8 10*3/uL (ref 4.0–10.5)
nRBC: 0 % (ref 0.0–0.2)

## 2021-05-01 LAB — FERRITIN: Ferritin: 62 ng/mL (ref 24–336)

## 2021-05-01 LAB — SAMPLE TO BLOOD BANK

## 2021-05-01 LAB — IRON AND IRON BINDING CAPACITY (CC-WL,HP ONLY)
Iron: 71 ug/dL (ref 45–182)
Saturation Ratios: 18 % (ref 17.9–39.5)
TIBC: 405 ug/dL (ref 250–450)
UIBC: 334 ug/dL (ref 117–376)

## 2021-05-01 LAB — RETICULOCYTES
Immature Retic Fract: 14.1 % (ref 2.3–15.9)
RBC.: 4.17 MIL/uL — ABNORMAL LOW (ref 4.22–5.81)
Retic Count, Absolute: 85.5 10*3/uL (ref 19.0–186.0)
Retic Ct Pct: 2.1 % (ref 0.4–3.1)

## 2021-05-02 ENCOUNTER — Other Ambulatory Visit: Payer: Self-pay | Admitting: Family

## 2021-05-05 ENCOUNTER — Inpatient Hospital Stay: Payer: Medicare Other

## 2021-05-05 ENCOUNTER — Other Ambulatory Visit: Payer: Self-pay

## 2021-05-05 VITALS — BP 126/61 | HR 70 | Temp 98.0°F | Resp 20

## 2021-05-05 DIAGNOSIS — K909 Intestinal malabsorption, unspecified: Secondary | ICD-10-CM

## 2021-05-05 DIAGNOSIS — D508 Other iron deficiency anemias: Secondary | ICD-10-CM | POA: Diagnosis not present

## 2021-05-05 DIAGNOSIS — D500 Iron deficiency anemia secondary to blood loss (chronic): Secondary | ICD-10-CM

## 2021-05-05 DIAGNOSIS — Z9884 Bariatric surgery status: Secondary | ICD-10-CM | POA: Diagnosis not present

## 2021-05-05 MED ORDER — SODIUM CHLORIDE 0.9 % IV SOLN: Freq: Once | INTRAVENOUS | Status: AC

## 2021-05-05 MED ORDER — SODIUM CHLORIDE 0.9 % IV SOLN
300.0000 mg | Freq: Once | INTRAVENOUS | Status: AC
  Administered 2021-05-05: 300 mg via INTRAVENOUS
  Filled 2021-05-05: qty 300

## 2021-05-07 DIAGNOSIS — G4730 Sleep apnea, unspecified: Secondary | ICD-10-CM | POA: Diagnosis not present

## 2021-05-07 DIAGNOSIS — G4733 Obstructive sleep apnea (adult) (pediatric): Secondary | ICD-10-CM | POA: Diagnosis not present

## 2021-05-09 ENCOUNTER — Other Ambulatory Visit: Payer: Self-pay | Admitting: Cardiology

## 2021-05-12 ENCOUNTER — Other Ambulatory Visit: Payer: Self-pay

## 2021-05-12 ENCOUNTER — Inpatient Hospital Stay: Payer: Medicare Other

## 2021-05-12 VITALS — BP 114/84 | HR 84 | Temp 99.5°F | Resp 16

## 2021-05-12 DIAGNOSIS — D500 Iron deficiency anemia secondary to blood loss (chronic): Secondary | ICD-10-CM

## 2021-05-12 DIAGNOSIS — Z9884 Bariatric surgery status: Secondary | ICD-10-CM | POA: Diagnosis not present

## 2021-05-12 DIAGNOSIS — K909 Intestinal malabsorption, unspecified: Secondary | ICD-10-CM | POA: Diagnosis not present

## 2021-05-12 DIAGNOSIS — D508 Other iron deficiency anemias: Secondary | ICD-10-CM | POA: Diagnosis not present

## 2021-05-12 MED ORDER — SODIUM CHLORIDE 0.9 % IV SOLN: Freq: Once | INTRAVENOUS | Status: AC

## 2021-05-12 MED ORDER — SODIUM CHLORIDE 0.9 % IV SOLN
300.0000 mg | Freq: Once | INTRAVENOUS | Status: AC
  Administered 2021-05-12: 300 mg via INTRAVENOUS
  Filled 2021-05-12: qty 300

## 2021-05-12 NOTE — Patient Instructions (Signed)

## 2021-05-22 ENCOUNTER — Encounter: Payer: Self-pay | Admitting: Family

## 2021-05-22 ENCOUNTER — Other Ambulatory Visit (INDEPENDENT_AMBULATORY_CARE_PROVIDER_SITE_OTHER): Payer: Medicare Other

## 2021-05-22 DIAGNOSIS — E782 Mixed hyperlipidemia: Secondary | ICD-10-CM | POA: Diagnosis not present

## 2021-05-22 DIAGNOSIS — M1A9XX0 Chronic gout, unspecified, without tophus (tophi): Secondary | ICD-10-CM | POA: Diagnosis not present

## 2021-05-22 DIAGNOSIS — I10 Essential (primary) hypertension: Secondary | ICD-10-CM

## 2021-05-22 DIAGNOSIS — E559 Vitamin D deficiency, unspecified: Secondary | ICD-10-CM | POA: Diagnosis not present

## 2021-05-22 LAB — CBC
HCT: 40.6 % (ref 39.0–52.0)
Hemoglobin: 13.5 g/dL (ref 13.0–17.0)
MCHC: 33.3 g/dL (ref 30.0–36.0)
MCV: 88.2 fl (ref 78.0–100.0)
Platelets: 242 10*3/uL (ref 150.0–400.0)
RBC: 4.6 Mil/uL (ref 4.22–5.81)
RDW: 13.6 % (ref 11.5–15.5)
WBC: 5.9 10*3/uL (ref 4.0–10.5)

## 2021-05-22 LAB — LIPID PANEL
Cholesterol: 143 mg/dL (ref 0–200)
HDL: 49.8 mg/dL (ref >39.00)
NonHDL: 92.96
Total CHOL/HDL Ratio: 3
Triglycerides: 289 mg/dL — ABNORMAL HIGH (ref 0.0–149.0)
VLDL: 57.8 mg/dL — ABNORMAL HIGH (ref 0.0–40.0)

## 2021-05-22 LAB — COMPREHENSIVE METABOLIC PANEL
ALT: 17 U/L (ref 0–53)
AST: 24 U/L (ref 0–37)
Albumin: 5 g/dL (ref 3.5–5.2)
Alkaline Phosphatase: 41 U/L (ref 39–117)
BUN: 21 mg/dL (ref 6–23)
CO2: 28 mEq/L (ref 19–32)
Calcium: 9.9 mg/dL (ref 8.4–10.5)
Chloride: 102 mEq/L (ref 96–112)
Creatinine, Ser: 1.23 mg/dL (ref 0.40–1.50)
GFR: 61.56 mL/min (ref >60.00)
Glucose, Bld: 157 mg/dL — ABNORMAL HIGH (ref 70–99)
Potassium: 4.2 mEq/L (ref 3.5–5.1)
Sodium: 139 mEq/L (ref 135–145)
Total Bilirubin: 0.7 mg/dL (ref 0.2–1.2)
Total Protein: 7.1 g/dL (ref 6.0–8.3)

## 2021-05-22 LAB — VITAMIN D 25 HYDROXY (VIT D DEFICIENCY, FRACTURES): VITD: 42.94 ng/mL (ref 30.00–100.00)

## 2021-05-22 LAB — TSH: TSH: 2.47 u[IU]/mL (ref 0.35–5.50)

## 2021-05-22 LAB — LDL CHOLESTEROL, DIRECT: Direct LDL: 74 mg/dL

## 2021-05-22 LAB — URIC ACID: Uric Acid, Serum: 7 mg/dL (ref 4.0–7.8)

## 2021-05-26 ENCOUNTER — Other Ambulatory Visit: Payer: Self-pay | Admitting: Family Medicine

## 2021-05-29 ENCOUNTER — Encounter: Payer: Self-pay | Admitting: Family Medicine

## 2021-05-29 ENCOUNTER — Ambulatory Visit (INDEPENDENT_AMBULATORY_CARE_PROVIDER_SITE_OTHER): Payer: Medicare Other | Admitting: Family Medicine

## 2021-05-29 VITALS — BP 124/66 | HR 90 | Temp 98.3°F | Resp 16 | Wt 257.2 lb

## 2021-05-29 DIAGNOSIS — Z9884 Bariatric surgery status: Secondary | ICD-10-CM

## 2021-05-29 DIAGNOSIS — E782 Mixed hyperlipidemia: Secondary | ICD-10-CM | POA: Diagnosis not present

## 2021-05-29 DIAGNOSIS — E538 Deficiency of other specified B group vitamins: Secondary | ICD-10-CM | POA: Diagnosis not present

## 2021-05-29 DIAGNOSIS — K922 Gastrointestinal hemorrhage, unspecified: Secondary | ICD-10-CM | POA: Diagnosis not present

## 2021-05-29 DIAGNOSIS — M25552 Pain in left hip: Secondary | ICD-10-CM | POA: Diagnosis not present

## 2021-05-29 DIAGNOSIS — M25551 Pain in right hip: Secondary | ICD-10-CM | POA: Diagnosis not present

## 2021-05-29 DIAGNOSIS — M545 Low back pain, unspecified: Secondary | ICD-10-CM

## 2021-05-29 DIAGNOSIS — Z86010 Personal history of colonic polyps: Secondary | ICD-10-CM

## 2021-05-29 DIAGNOSIS — E1100 Type 2 diabetes mellitus with hyperosmolarity without nonketotic hyperglycemic-hyperosmolar coma (NKHHC): Secondary | ICD-10-CM

## 2021-05-29 DIAGNOSIS — E559 Vitamin D deficiency, unspecified: Secondary | ICD-10-CM

## 2021-05-29 DIAGNOSIS — I10 Essential (primary) hypertension: Secondary | ICD-10-CM | POA: Diagnosis not present

## 2021-05-29 MED ORDER — FLUTICASONE PROPIONATE 50 MCG/ACT NA SUSP: 2.0000 | Freq: Every day | NASAL | 1 refills | Status: DC

## 2021-05-29 NOTE — Patient Instructions (Addendum)
Vitamin D increase to 6000 IU daily and drop the 50000 IU weekly dose for now  Hypertension, Adult High blood pressure (hypertension) is when the force of blood pumping through the arteries is too strong. The arteries are the blood vessels that carry blood from the heart throughout the body. Hypertension forces the heart to work harder to pump blood and may cause arteries to become narrow or stiff. Untreated or uncontrolled hypertension can cause a heart attack, heart failure, a stroke, kidney disease, and other problems. A blood pressure reading consists of a higher number over a lower number. Ideally, your blood pressure should be below 120/80. The first ("top") number is called the systolic pressure. It is a measure of the pressure in your arteries as your heart beats. The second ("bottom") number is called the diastolic pressure. It is a measure of the pressure in your arteries as the heart relaxes. What are the causes? The exact cause of this condition is not known. There are some conditions that result in or are related to high blood pressure. What increases the risk? Some risk factors for high blood pressure are under your control. The following factors may make you more likely to develop this condition: Smoking. Having type 2 diabetes mellitus, high cholesterol, or both. Not getting enough exercise or physical activity. Being overweight. Having too much fat, sugar, calories, or salt (sodium) in your diet. Drinking too much alcohol. Some risk factors for high blood pressure may be difficult or impossible to change. Some of these factors include: Having chronic kidney disease. Having a family history of high blood pressure. Age. Risk increases with age. Race. You may be at higher risk if you are African American. Gender. Men are at higher risk than women before age 34. After age 40, women are at higher risk than men. Having obstructive sleep apnea. Stress. What are the signs or  symptoms? High blood pressure may not cause symptoms. Very high blood pressure (hypertensive crisis) may cause: Headache. Anxiety. Shortness of breath. Nosebleed. Nausea and vomiting. Vision changes. Severe chest pain. Seizures. How is this diagnosed? This condition is diagnosed by measuring your blood pressure while you are seated, with your arm resting on a flat surface, your legs uncrossed, and your feet flat on the floor. The cuff of the blood pressure monitor will be placed directly against the skin of your upper arm at the level of your heart. It should be measured at least twice using the same arm. Certain conditions can cause a difference in blood pressure between your right and left arms. Certain factors can cause blood pressure readings to be lower or higher than normal for a short period of time: When your blood pressure is higher when you are in a health care provider's office than when you are at home, this is called white coat hypertension. Most people with this condition do not need medicines. When your blood pressure is higher at home than when you are in a health care provider's office, this is called masked hypertension. Most people with this condition may need medicines to control blood pressure. If you have a high blood pressure reading during one visit or you have normal blood pressure with other risk factors, you may be asked to: Return on a different day to have your blood pressure checked again. Monitor your blood pressure at home for 1 week or longer. If you are diagnosed with hypertension, you may have other blood or imaging tests to help your health care provider  understand your overall risk for other conditions. How is this treated? This condition is treated by making healthy lifestyle changes, such as eating healthy foods, exercising more, and reducing your alcohol intake. Your health care provider may prescribe medicine if lifestyle changes are not enough to get your  blood pressure under control, and if: Your systolic blood pressure is above 130. Your diastolic blood pressure is above 80. Your personal target blood pressure may vary depending on your medical conditions, your age, and other factors. Follow these instructions at home: Eating and drinking  Eat a diet that is high in fiber and potassium, and low in sodium, added sugar, and fat. An example eating plan is called the DASH (Dietary Approaches to Stop Hypertension) diet. To eat this way: Eat plenty of fresh fruits and vegetables. Try to fill one half of your plate at each meal with fruits and vegetables. Eat whole grains, such as whole-wheat pasta, brown rice, or whole-grain bread. Fill about one fourth of your plate with whole grains. Eat or drink low-fat dairy products, such as skim milk or low-fat yogurt. Avoid fatty cuts of meat, processed or cured meats, and poultry with skin. Fill about one fourth of your plate with lean proteins, such as fish, chicken without skin, beans, eggs, or tofu. Avoid pre-made and processed foods. These tend to be higher in sodium, added sugar, and fat. Reduce your daily sodium intake. Most people with hypertension should eat less than 1,500 mg of sodium a day. Do not drink alcohol if: Your health care provider tells you not to drink. You are pregnant, may be pregnant, or are planning to become pregnant. If you drink alcohol: Limit how much you use to: 0-1 drink a day for women. 0-2 drinks a day for men. Be aware of how much alcohol is in your drink. In the [place] one drink equals one 12 oz bottle of beer (355 mL), one 5 oz glass of wine (148 mL), or one 1 oz glass of hard liquor (44 mL). Lifestyle  Work with your health care provider to maintain a healthy body weight or to lose weight. Ask what an ideal weight is for you. Get at least 30 minutes of exercise most days of the week. Activities may include walking, swimming, or biking. Include exercise to strengthen  your muscles (resistance exercise), such as Pilates or lifting weights, as part of your weekly exercise routine. Try to do these types of exercises for 30 minutes at least 3 days a week. Do not use any products that contain nicotine or tobacco, such as cigarettes, e-cigarettes, and chewing tobacco. If you need help quitting, ask your health care provider. Monitor your blood pressure at home as told by your health care provider. Keep all follow-up visits as told by your health care provider. This is important. Medicines Take over-the-counter and prescription medicines only as told by your health care provider. Follow directions carefully. Blood pressure medicines must be taken as prescribed. Do not skip doses of blood pressure medicine. Doing this puts you at risk for problems and can make the medicine less effective. Ask your health care provider about side effects or reactions to medicines that you should watch for. Contact a health care provider if you: Think you are having a reaction to a medicine you are taking. Have headaches that keep coming back (recurring). Feel dizzy. Have swelling in your ankles. Have trouble with your vision. Get help right away if you: Develop a severe headache or confusion. Have unusual  weakness or numbness. Feel faint. Have severe pain in your chest or abdomen. Vomit repeatedly. Have trouble breathing. Summary Hypertension is when the force of blood pumping through your arteries is too strong. If this condition is not controlled, it may put you at risk for serious complications. Your personal target blood pressure may vary depending on your medical conditions, your age, and other factors. For most people, a normal blood pressure is less than 120/80. Hypertension is treated with lifestyle changes, medicines, or a combination of both. Lifestyle changes include losing weight, eating a healthy, low-sodium diet, exercising more, and limiting alcohol. This  information is not intended to replace advice given to you by your health care provider. Make sure you discuss any questions you have with your health care provider. Document Revised: 12/15/2017 Document Reviewed: 12/15/2017 Elsevier Patient Education  [place].

## 2021-06-02 ENCOUNTER — Telehealth: Payer: Self-pay

## 2021-06-02 NOTE — Assessment & Plan Note (Signed)
Supplement and monitor 

## 2021-06-02 NOTE — Progress Notes (Signed)
Subjective:    Patient ID: Brent [name], male    DOB: Oct 31, 1955, 66 y.o.   MRN: [id number]  No chief complaint on file.   HPI Patient is in today for follow up on chronic medical concerns. No recent febrile illness. He suffered a GI bleed and severe anemia but is improved at this time from that perspective now. No current bleeding, pain, nausea, vomiting etc. He had to stop all muscle relaxers and is struggling with increased diffuse pain and has been following with Dr Tonita Cong at [facility]. He has been considering a second opinion regarding his options with neurosurgery. No new injury or symptoms. His sugar has been well controlled. Last hgba1c was 5.5. Denies CP/palp/SOB/HA/congestion/fevers/GI or GU c/o. Taking meds as prescribed   Past Medical History:  Diagnosis Date   Acid reflux disease    ACID REFLUX DISEASE 07/05/2007   Acute gastric ulcer with bleeding    Alcohol abuse    Anemia    Atherosclerosis    Benign neoplasm of colon 07/05/2007   Bilateral hip pain 10/05/2016   Bladder cancer ([place])    Breast pain, left 11/17/2011   CAD (coronary artery disease)    Chest pain    a. Reportedly negative dobut echo performed prior to gastric bypass in 03/2011;  b. 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 12/2011 Cath: LM nl, LAD 50p, D1 35m, LCX min irregs, OM3 30, RCA 25p, 91m (FFR 0.99->0.89), PDA 30, EF 65%, Med Rx.   COLONIC POLYPS, HX OF 10/29/2008   Diarrhea 06/13/2010   Diverticulosis 07/05/2018   DIVERTICULOSIS, COLON 10/29/2008   DM (diabetes mellitus), type 2, uncontrolled    GI bleed    Gout 03/04/2013   Hearing loss 05/24/2013   Previous audiology evaluation completely.   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    Neck pain 03/2015   Other and unspecified hyperlipidemia 11/16/2012   Post-operative nausea and vomiting    after the surgery in hospital in March 2020/ past the cauderization   Preventative health care 11/17/2011   Renal stone 11/2013   Sleep apnea    a. CPAP   Spinal stenosis    Tear of meniscus of left knee 2012    Past Surgical History:  Procedure Laterality Date   ANKLE SURGERY Right 1994   BICEPS TENDON REPAIR     left side   CARDIAC CATHETERIZATION     denies any chest pain in the past 2 years   COLONOSCOPY     colonoscopy polyps     ESOPHAGOGASTRODUODENOSCOPY N/A 03/27/2013   Procedure: ESOPHAGOGASTRODUODENOSCOPY (EGD);  Surgeon: Irene Shipper, MD;  Location: [facility];  Service: Endoscopy;  Laterality: N/A;   ESOPHAGOGASTRODUODENOSCOPY (EGD) WITH PROPOFOL N/A 07/06/2018   Procedure: ESOPHAGOGASTRODUODENOSCOPY (EGD) WITH PROPOFOL;  Surgeon: Jerene Bears, MD;  Location: [facility];  Service: Gastroenterology;  Laterality: N/A;   GASTRIC BYPASS     HERNIA REPAIR     HOT HEMOSTASIS N/A 07/06/2018   Procedure: HOT HEMOSTASIS (ARGON PLASMA COAGULATION/BICAP);  Surgeon: Jerene Bears, MD;  Location: [facility];  Service: Gastroenterology;  Laterality: N/A;   KNEE ARTHROSCOPY Left 11/06/2010   Left, torn meniscus (repaired)   LEFT HEART CATHETERIZATION WITH  CORONARY ANGIOGRAM N/A 12/23/2011   Procedure: LEFT HEART CATHETERIZATION WITH CORONARY ANGIOGRAM;  Surgeon: Minus Breeding, MD;  Location[facility] C[facility] LAB;  Service: Cardiovascular;  Laterality: N/A;   REPLACEMENT TOTAL KNEE Right 01/2016   removed scar tissue/ cut tip of nerve bundle and re-route   right knee arthroscopy Right 07/05/14   Dr. Hart Robinsons, [place].   ROTATOR CUFF REPAIR  2019   left   SCHLEROTHERAPY  07/06/2018   Procedure: SCHLEROTHERAPY;  Surgeon: Jerene Bears, MD;  Location: [facility];  Service: Gastroenterology;;   TONSILLECTOMY  age 39   TONSILLECTOMY     as a child   TOTAL KNEE ARTHROPLASTY  Right 01/20/2016   Procedure: RIGHT TOTAL KNEE ARTHROPLASTY;  Surgeon: Paralee Cancel, MD;  Location: [facility];  Service: Orthopedics;  Laterality: Right;   TRANSURETHRAL RESECTION OF BLADDER TUMOR N/A 09/24/2020   Procedure: TRANSURETHRAL RESECTION OF BLADDER TUMOR (TURBT)  RIGHT retrograde pylegram;  Surgeon: [name], MD;  Location: [facility];  Service: Urology;  Laterality: N/A;   UPPER GI ENDOSCOPY  03/27/13    Family History  Problem Relation Age of Onset   Diabetes Mother    Hypertension Mother    Stroke Mother    Hyperlipidemia Mother    Hypertension Father    Colon polyps Father    Heart attack Father 26   Stroke Father    Heart attack Brother    Diabetes Brother    Heart disease Brother    Heart attack Brother        Multiple   Diabetes Brother    Diabetes Sister    Stroke Sister    Obesity Brother    Diabetes Brother    Heart disease Brother    Hypertension Maternal Grandmother    ADD / ADHD Daughter    Heart disease Brother    Stomach cancer Neg Hx    Colon cancer Neg Hx    Esophageal cancer Neg Hx    Rectal cancer Neg Hx     Social History   Socioeconomic History   Marital status: Married    Spouse name: Not on file   Number of children: 2   Years of education: Not on file   Highest education level: Not on file  Occupational History   Occupation: TEACHER    Employer: [employer]  Tobacco Use   Smoking status: Former    Packs/day: 1.50    Years: 20.00    Pack years: 30.00    Types: Cigarettes    Quit date: 04/21/1991    Years since quitting: 30.1   Smokeless tobacco: Never  Vaping Use   Vaping Use: Never used  Substance and Sexual Activity   Alcohol use: Yes    Alcohol/week: 3.0 standard drinks    Types: 3 Cans of beer per week    Comment: weekly   Drug use: No   Sexual activity: Not on file  Other Topics Concern   Not on file  Social History Narrative   Lives with wife in [place].  Does not routinely exercise.   Social  Determinants of Health   Financial Resource Strain: Not on file  Food Insecurity: Not on file  Transportation Needs: Not on file  Physical Activity: Not on file  Stress: Not on file  Social Connections: Not on file  Intimate Partner Violence: Not on file    Outpatient Medications Prior to Visit  Medication Sig Dispense Refill   acetaminophen (TYLENOL) 500 MG tablet Take  1,000 mg by mouth every 6 (six) hours as needed for moderate pain or headache.     amitriptyline (ELAVIL) 50 MG tablet Take 1 tablet (50 mg total) by mouth at bedtime. 90 tablet 3   cetirizine (ZYRTEC) 10 MG tablet Take 10 mg by mouth at bedtime.     Cholecalciferol (DIALYVITE VITAMIN D 5000) 125 MCG (5000 UT) capsule Take 5,000 Units by mouth daily.     Cyanocobalamin (VITAMIN B-12) 1000 MCG SUBL Place 1,000 mcg under the tongue once a week.     econazole nitrate 1 % cream Apply topically daily. (Patient taking differently: Apply 1 application topically daily as needed (fungus).) 30 g 1   EPINEPHRINE 0.3 mg/0.3 mL IJ SOAJ injection INJECT 0.3 MLS (0.3 MG TOTAL) INTO THE MUSCLE ONCE FOR 1 DOSE 2 each 11   famotidine (PEPCID) 40 MG tablet TAKE 1 TABLET AT BEDTIME AS NEEDED FOR HEARTBURN OR INDIGESTION 90 tablet 1   fenofibrate 160 MG tablet TAKE 1 TABLET DAILY 90 tablet 3   Ferrous Gluconate-C-Folic Acid (IRON-C PO) Take 1 tablet by mouth daily. Iron 65 mg and vit C-125 mg     gabapentin (NEURONTIN) 300 MG capsule TAKE 1 CAPSULE TWICE A DAY AND 2 CAPSULES AT BEDTIME (Patient taking differently: Take 300-600 mg by mouth See admin instructions. Take 300 mg twice daily and 600 mg at bedtime) 360 capsule 3   glucose blood (FREESTYLE LITE) test strip Use to check blood sugar 4 times day.  Dx Code: E11.9 400 each 1   glyBURIDE (DIABETA) 5 MG tablet Take 1 tablet (5 mg total) by mouth daily. 90 tablet 1   insulin lispro (HUMALOG) 100 UNIT/ML KwikPen Inject 2-6 Units into the skin 3 (three) times daily. Per sliding scale. 15 mL 0    Insulin Pen Needle (PEN NEEDLES 31GX5/16") 31G X 8 MM MISC Use as directed with Humalog 3 times a day and at bedtime 100 each 0   LIVALO 2 MG TABS TAKE 1 TABLET DAILY (Patient taking differently: Take 2 mg by mouth at bedtime.) 90 tablet 3   losartan (COZAAR) 50 MG tablet Take 1 tablet (50 mg total) by mouth daily. 90 tablet 1   Magnesium 100 MG CAPS Take 1 capsule (100 mg total) by mouth daily. 30 capsule 1   metoprolol tartrate (LOPRESSOR) 100 MG tablet TAKE 1 TABLET TWICE A DAY 180 tablet 0   mometasone (ELOCON) 0.1 % cream Apply 1 application topically daily. (Patient taking differently: Apply 1 application topically daily as needed (poison ivy).) 90 g 1   Multiple Vitamin (MULTIVITAMIN) tablet Take 1 tablet by mouth daily. BARIATRIC VIT     oxycodone (OXY-IR) 5 MG capsule Take 1 mg by mouth 3 (three) times daily. PRN     pantoprazole (PROTONIX) 40 MG tablet TAKE 1 TABLET TWICE A DAY BEFORE MEALS 180 tablet 3   sodium chloride (OCEAN) 0.65 % SOLN nasal spray Place 1 spray into both nostrils as needed for congestion (nose irritation). 30 mL 0   Vitamin D, Ergocalciferol, (DRISDOL) 1.25 MG (50000 UNIT) CAPS capsule TAKE 1 CAPSULE EVERY 7 DAYS 12 capsule 3   methocarbamol (ROBAXIN) 500 MG tablet Take 1 tablet (500 mg total) by mouth 2 (two) times daily as needed for muscle spasms. 60 tablet 2   No facility-administered medications prior to visit.    Allergies  Allergen Reactions   Bee Venom Anaphylaxis   Lipitor [Atorvastatin] Other (See Comments)    Myalgias, memory changes   Morphine  Other (See Comments)    hyperactive   Metformin And Related Other (See Comments)    Myalgias and weakness   Robaxin [Methocarbamol]     GI bleed    Baclofen     Acted drunk   Medrol [Methylprednisolone]     Hyperglycemia    Simvastatin     Joint pain   Hydrocodone Itching   Pravastatin Other (See Comments)    Made joints    Review of Systems  Constitutional:  Negative for fever and  malaise/fatigue.  HENT:  Negative for congestion.   Eyes:  Negative for blurred vision.  Respiratory:  Negative for shortness of breath.   Cardiovascular:  Negative for chest pain, palpitations and leg swelling.  Gastrointestinal:  Negative for abdominal pain, blood in stool and nausea.  Genitourinary:  Negative for dysuria and frequency.  Musculoskeletal:  Negative for falls.  Skin:  Negative for rash.  Neurological:  Negative for dizziness, loss of consciousness and headaches.  Endo/Heme/Allergies:  Negative for environmental allergies.  Psychiatric/Behavioral:  Negative for depression. The patient is not nervous/anxious.       Objective:    Physical Exam Constitutional:      General: He is not in acute distress.    Appearance: Normal appearance. He is not ill-appearing or toxic-appearing.  HENT:     Head: Normocephalic and atraumatic.     Right Ear: External ear normal.     Left Ear: External ear normal.     Nose: Nose normal.  Eyes:     General:        Right eye: No discharge.        Left eye: No discharge.     Conjunctiva/sclera: Conjunctivae normal.  Cardiovascular:     Rate and Rhythm: Normal rate and regular rhythm.  Pulmonary:     Effort: Pulmonary effort is normal.     Breath sounds: No wheezing.  Abdominal:     Palpations: Abdomen is soft. There is no mass.     Tenderness: There is no guarding.  Musculoskeletal:     Cervical back: Neck supple.     Right lower leg: No edema.     Left lower leg: No edema.  Skin:    Findings: No rash.  Neurological:     Mental Status: He is alert and oriented to person, place, and time.  Psychiatric:        Behavior: Behavior normal.    BP 124/66    Pulse 90    Temp 98.3 F (36.8 C)    Resp 16    Wt 257 lb 3.2 oz (116.7 kg)    SpO2 93%    BMI 41.83 kg/m  Wt Readings from Last 3 Encounters:  05/29/21 257 lb 3.2 oz (116.7 kg)  02/24/21 262 lb (118.8 kg)  01/23/21 263 lb 3.2 oz (119.4 kg)    Diabetic Foot Exam - Simple    No data filed    Lab Results  Component Value Date   WBC 5.9 05/22/2021   HGB 13.5 05/22/2021   HCT 40.6 05/22/2021   PLT 242.0 05/22/2021   GLUCOSE 157 (H) 05/22/2021   CHOL 143 05/22/2021   TRIG 289.0 (H) 05/22/2021   HDL 49.80 05/22/2021   LDLDIRECT 74.0 05/22/2021   LDLCALC 45 01/20/2021   ALT 17 05/22/2021   AST 24 05/22/2021   NA 139 05/22/2021   K 4.2 05/22/2021   CL 102 05/22/2021   CREATININE 1.23 05/22/2021   BUN 21 05/22/2021  CO2 28 05/22/2021   TSH 2.47 05/22/2021   PSA 1.55 06/25/2017   INR 1.3 (H) 07/06/2018   HGBA1C 8.0 (H) 09/24/2020   MICROALBUR 1.1 12/02/2015    Lab Results  Component Value Date   TSH 2.47 05/22/2021   Lab Results  Component Value Date   WBC 5.9 05/22/2021   HGB 13.5 05/22/2021   HCT 40.6 05/22/2021   MCV 88.2 05/22/2021   PLT 242.0 05/22/2021   Lab Results  Component Value Date   NA 139 05/22/2021   K 4.2 05/22/2021   CO2 28 05/22/2021   GLUCOSE 157 (H) 05/22/2021   BUN 21 05/22/2021   CREATININE 1.23 05/22/2021   BILITOT 0.7 05/22/2021   ALKPHOS 41 05/22/2021   AST 24 05/22/2021   ALT 17 05/22/2021   PROT 7.1 05/22/2021   ALBUMIN 5.0 05/22/2021   CALCIUM 9.9 05/22/2021   ANIONGAP 8 09/24/2020   GFR 61.56 05/22/2021   Lab Results  Component Value Date   CHOL 143 05/22/2021   Lab Results  Component Value Date   HDL 49.80 05/22/2021   Lab Results  Component Value Date   LDLCALC 45 01/20/2021   Lab Results  Component Value Date   TRIG 289.0 (H) 05/22/2021   Lab Results  Component Value Date   CHOLHDL 3 05/22/2021   Lab Results  Component Value Date   HGBA1C 8.0 (H) 09/24/2020       Assessment & Plan:   Problem List Items Addressed This Visit     T2DM (type 2 diabetes mellitus) ([place])    hgba1c acceptable, minimize simple carbs. Increase exercise as tolerated. Continue current meds      Essential hypertension    Well controlled, no changes to meds. Encouraged heart healthy diet such as  the DASH diet and exercise as tolerated.       Relevant Orders   CBC   Comprehensive metabolic panel   TSH   Hyperlipidemia, mixed   Relevant Orders   Lipid panel   History of Roux-en-Y gastric bypass    Continue to monitor nutrients and maintain a heart healthy diet.       Relevant Orders   Ambulatory referral to Gastroenterology   Vitamin B12   VITAMIN D 25 Hydroxy (Vit-D Deficiency, Fractures)   Vitamin B1   Hx of adenomatous colonic polyps   Relevant Orders   Ambulatory referral to Gastroenterology   Vitamin D deficiency    Supplement and monitor      Pain of both hip joints    Struggles with chronic pain but has had to stop all muscle relaxers due to a GI bleed. Is managing his ADLs but his pain has increased      Vitamin B12 deficiency    Supplement and monitor      Low back pain    Has been following with [facility], Dr Tonita Cong and he reports he has imaging from there that makes him want a consult with a neurosurgeon. Will request records and then proceed with referral      RESOLVED: Acute GI bleeding - Primary   Relevant Orders   Ambulatory referral to Gastroenterology    I have discontinued Brent [name] "Don"'s methocarbamol. I am also having him start on fluticasone. Additionally, I am having him maintain his multivitamin, Vitamin B-12, Magnesium, sodium chloride, Ferrous Gluconate-C-Folic Acid (IRON-C PO), econazole nitrate, mometasone, EPINEPHrine, cetirizine, oxycodone, Livalo, amitriptyline, PEN NEEDLES 31GX5/16", FREESTYLE LITE, gabapentin, Dialyvite Vitamin D 5000, acetaminophen, insulin lispro, losartan,  fenofibrate, glyBURIDE, pantoprazole, famotidine, metoprolol tartrate, and Vitamin D (Ergocalciferol).  Meds ordered this encounter  Medications   fluticasone (FLONASE) 50 MCG/ACT nasal spray    Sig: Place 2 sprays into both nostrils daily.    Dispense:  48 g    Refill:  1     [name], MD

## 2021-06-02 NOTE — Assessment & Plan Note (Signed)
Well controlled, no changes to meds. Encouraged heart healthy diet such as the DASH diet and exercise as tolerated.  °

## 2021-06-02 NOTE — Assessment & Plan Note (Signed)
hgba1c acceptable, minimize simple carbs. Increase exercise as tolerated. Continue current meds 

## 2021-06-02 NOTE — Assessment & Plan Note (Signed)
Continue to monitor nutrients and maintain a heart healthy diet.

## 2021-06-02 NOTE — Telephone Encounter (Signed)
Per Dr. Charlett Blake -Madaline Brilliant then let him know that [facility] might not take referral without new imaging  Pt aware

## 2021-06-02 NOTE — Assessment & Plan Note (Signed)
Has been following with [facility], Dr Tonita Cong and he reports he has imaging from there that makes him want a consult with a neurosurgeon. Will request records and then proceed with referral

## 2021-06-02 NOTE — Assessment & Plan Note (Signed)
Struggles with chronic pain but has had to stop all muscle relaxers due to a GI bleed. Is managing his ADLs but his pain has increased

## 2021-06-03 DIAGNOSIS — M542 Cervicalgia: Secondary | ICD-10-CM | POA: Diagnosis not present

## 2021-06-04 DIAGNOSIS — G4730 Sleep apnea, unspecified: Secondary | ICD-10-CM | POA: Diagnosis not present

## 2021-06-04 DIAGNOSIS — G4733 Obstructive sleep apnea (adult) (pediatric): Secondary | ICD-10-CM | POA: Diagnosis not present

## 2021-06-06 DIAGNOSIS — Z7984 Long term (current) use of oral hypoglycemic drugs: Secondary | ICD-10-CM | POA: Diagnosis not present

## 2021-06-06 DIAGNOSIS — Z794 Long term (current) use of insulin: Secondary | ICD-10-CM | POA: Diagnosis not present

## 2021-06-06 DIAGNOSIS — Z7985 Long-term (current) use of injectable non-insulin antidiabetic drugs: Secondary | ICD-10-CM | POA: Diagnosis not present

## 2021-06-06 DIAGNOSIS — E119 Type 2 diabetes mellitus without complications: Secondary | ICD-10-CM | POA: Diagnosis not present

## 2021-06-06 DIAGNOSIS — E1165 Type 2 diabetes mellitus with hyperglycemia: Secondary | ICD-10-CM | POA: Diagnosis not present

## 2021-06-09 ENCOUNTER — Other Ambulatory Visit: Payer: Self-pay | Admitting: Cardiology

## 2021-06-13 ENCOUNTER — Encounter: Payer: Self-pay | Admitting: Family Medicine

## 2021-06-13 ENCOUNTER — Encounter: Payer: Self-pay | Admitting: *Deleted

## 2021-06-16 ENCOUNTER — Other Ambulatory Visit: Payer: Self-pay | Admitting: *Deleted

## 2021-06-16 DIAGNOSIS — M545 Low back pain, unspecified: Secondary | ICD-10-CM

## 2021-06-23 ENCOUNTER — Other Ambulatory Visit: Payer: Self-pay | Admitting: Family Medicine

## 2021-06-23 DIAGNOSIS — G8929 Other chronic pain: Secondary | ICD-10-CM | POA: Diagnosis not present

## 2021-06-23 DIAGNOSIS — M545 Low back pain, unspecified: Secondary | ICD-10-CM | POA: Diagnosis not present

## 2021-06-23 DIAGNOSIS — M546 Pain in thoracic spine: Secondary | ICD-10-CM | POA: Diagnosis not present

## 2021-07-01 DIAGNOSIS — M542 Cervicalgia: Secondary | ICD-10-CM | POA: Diagnosis not present

## 2021-07-03 ENCOUNTER — Other Ambulatory Visit: Payer: Self-pay | Admitting: Family Medicine

## 2021-07-09 DIAGNOSIS — Z8551 Personal history of malignant neoplasm of bladder: Secondary | ICD-10-CM | POA: Diagnosis not present

## 2021-07-14 DIAGNOSIS — M546 Pain in thoracic spine: Secondary | ICD-10-CM | POA: Diagnosis not present

## 2021-07-14 DIAGNOSIS — Z5181 Encounter for therapeutic drug level monitoring: Secondary | ICD-10-CM | POA: Diagnosis not present

## 2021-07-14 DIAGNOSIS — M5412 Radiculopathy, cervical region: Secondary | ICD-10-CM | POA: Diagnosis not present

## 2021-07-14 DIAGNOSIS — M5136 Other intervertebral disc degeneration, lumbar region: Secondary | ICD-10-CM | POA: Diagnosis not present

## 2021-07-14 DIAGNOSIS — M545 Low back pain, unspecified: Secondary | ICD-10-CM | POA: Diagnosis not present

## 2021-07-14 DIAGNOSIS — Z79899 Other long term (current) drug therapy: Secondary | ICD-10-CM | POA: Diagnosis not present

## 2021-07-14 DIAGNOSIS — M5030 Other cervical disc degeneration, unspecified cervical region: Secondary | ICD-10-CM | POA: Diagnosis not present

## 2021-07-14 NOTE — Progress Notes (Signed)
?Cardiology Office Note:   ? ?Date:  07/15/2021  ? ?ID:  Brent [name], DOB 02-May-1955, MRN [id number] ? ?PCP:  [name], MD ?  ?[facility] Providers ?Cardiologist:  Candee Furbish, MD ?Click to update primary MD,subspecialty MD or APP then REFRESH:1}   ? ?Referring MD: [name], MD  ? ?Chief Complaint: annual follow-up CAD, hypertension ? ?History of Present Illness:   ? ?Brent [name] is a 66 y.o. male with a hx of hypertension, CAD, OSA on CPAP, diabetes, gastric bypass surgery 2012, obesity, and hyperlipidemia.  ? ?He had cardiac catheterization 12/23/2011 with 70% mid RCA managed medically following coronary CT which revealed calcium score > 1000. Nuclear stress test low risk in 2016.  Has had difficult to control hypertension, diabetes, and frequent palpitations. Metoprolol was increased to 100 mg a day for frequent palpitations and he has tolerated well. Hyperlipidemia managed on Livalo.  ? ?He was last seen in our office on 07/05/2020 by Dr. Marlou Porch at which time no changes were made to his treatment plan and 1 year follow-up was recommended.  ? ?Today, he is here alone for follow-up.  Activity is limited by back pain, constant, that feels like hes been hit in the back with a bat. Also has bilateral knee pain.  Up and down stairs at home and limited walking.  Cannot determine if shortness of breath is worse than in the past. Occasional LE edema, no orthopnea, no PND. No chest pain. Occasional palpitations, they do not interfere with daily activities. Was taken off aspirin for GI bleeding, bowel movements are now normal. Home BP readings consistently 631-497 systolic and 02-63 diastolic. No dizziness, lightheadedness, presyncope, syncope.  ? ?Past Medical History:  ?Diagnosis Date  ? Acid reflux disease   ? ACID REFLUX DISEASE 07/05/2007  ? Acute gastric ulcer with bleeding   ? Alcohol abuse   ? Anemia   ? Atherosclerosis   ? Benign neoplasm of colon 07/05/2007  ? Bilateral hip pain 10/05/2016  ?  Bladder cancer ([place])   ? Breast pain, left 11/17/2011  ? CAD (coronary artery disease)   ? Chest pain   ? a. Reportedly negative dobut echo performed prior to gastric bypass in 03/2011;  b. 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 12/2011 Cath: LM nl, LAD 50p, D1 30m LCX min irregs, OM3 30, RCA 25p, 719mFFR 0.99->0.89), PDA 30, EF 65%, Med Rx.  ? COLONIC POLYPS, HX OF 10/29/2008  ? Diarrhea 06/13/2010  ? Diverticulosis 07/05/2018  ? DIVERTICULOSIS, COLON 10/29/2008  ? DM (diabetes mellitus), type 2, uncontrolled   ? GI bleed   ? Gout 03/04/2013  ? Hearing loss 05/24/2013  ? Previous audiology evaluation completely.  ? HTN (hypertension) 07/14/2010  ? Hx of colonic polyps   ? HYPERSOMNIA, ASSOCIATED WITH SLEEP APNEA 07/26/2008  ? Hypertension 07/14/2010  ? 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 Internal hemorrhoids   ? Knee pain, left 10/10/2010  ? Morbid [place] ([place]/11/2009  ? a. 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  ? Neck pain 03/2015  ? Other and unspecified hyperlipidemia 11/16/2012  ? Post-operative nausea and vomiting   ? after the surgery in hospital in March 2020/ past the cauderization  ? Preventative health care 11/17/2011  ? Renal stone 11/2013  ? Sleep apnea   ? a. CPAP  ? Spinal stenosis   ? Tear of meniscus of left knee 2012  ? ? ?Past Surgical History:  ?Procedure Laterality Date  ? ANKLE  SURGERY Right 1994  ? BICEPS TENDON REPAIR    ? left side  ? CARDIAC CATHETERIZATION    ? denies any chest pain in the past 2 years  ? COLONOSCOPY    ? colonoscopy polyps    ? ESOPHAGOGASTRODUODENOSCOPY N/A 03/27/2013  ? Procedure: ESOPHAGOGASTRODUODENOSCOPY (EGD);  Surgeon: Irene Shipper, MD;  Location: [facility];  Service: Endoscopy;  Laterality: N/A;  ? ESOPHAGOGASTRODUODENOSCOPY (EGD) WITH PROPOFOL N/A 07/06/2018  ? Procedure: ESOPHAGOGASTRODUODENOSCOPY (EGD) WITH PROPOFOL;  Surgeon: Jerene Bears, MD;  Location: [facility];  Service: Gastroenterology;  Laterality: N/A;  ? GASTRIC BYPASS    ? HERNIA REPAIR    ? HOT  HEMOSTASIS N/A 07/06/2018  ? Procedure: HOT HEMOSTASIS (ARGON PLASMA COAGULATION/BICAP);  Surgeon: Jerene Bears, MD;  Location: [facility];  Service: Gastroenterology;  Laterality: N/A;  ? KNEE ARTHROSCOPY Left 11/06/2010  ? Left, torn meniscus (repaired)  ? LEFT HEART CATHETERIZATION WITH CORONARY ANGIOGRAM N/A 12/23/2011  ? Procedure: LEFT HEART CATHETERIZATION WITH CORONARY ANGIOGRAM;  Surgeon: Minus Breeding, MD;  [facility] C[facility] LAB;  Service: Cardiovascular;  Laterality: N/A;  ? REPLACEMENT TOTAL KNEE Right 01/2016  ? removed scar tissue/ cut tip of nerve bundle and re-route  ? right knee arthroscopy Right 07/05/14  ? Dr. Hart Robinsons, [place]  2019  ? left  ? SCHLEROTHERAPY  07/06/2018  ? Procedure: SCHLEROTHERAPY;  Surgeon: Jerene Bears, MD;  Location: [facility];  Service: Gastroenterology;;  ? TONSILLECTOMY  age 86  ? TONSILLECTOMY    ? as a child  ? TOTAL KNEE ARTHROPLASTY Right 01/20/2016  ? Procedure: RIGHT TOTAL KNEE ARTHROPLASTY;  Surgeon: Paralee Cancel, MD;  Location: [facility];  Service: Orthopedics;  Laterality: Right;  ? TRANSURETHRAL RESECTION OF BLADDER TUMOR N/A 09/24/2020  ? Procedure: TRANSURETHRAL RESECTION OF BLADDER TUMOR (TURBT)  RIGHT retrograde pylegram;  Surgeon: [name], MD;  Location: [facility];  Service: Urology;  Laterality: N/A;  ? UPPER GI ENDOSCOPY  03/27/13  ? ? ?Current Medications: ?Current Meds  ?Medication Sig  ? acetaminophen (TYLENOL) 500 MG tablet Take 1,000 mg by mouth every 6 (six) hours as needed for moderate pain or headache.  ? amitriptyline (ELAVIL) 50 MG tablet TAKE 1 TABLET AT BEDTIME  ? cetirizine (ZYRTEC) 10 MG tablet Take 10 mg by mouth at bedtime.  ? Cholecalciferol (DIALYVITE VITAMIN D 5000) 125 MCG (5000 UT) capsule Take 5,000 Units by mouth daily.  ? Cyanocobalamin (VITAMIN B-12) 1000 MCG SUBL Place 1,000 mcg under the tongue once a week.  ? EPINEPHRINE 0.3 mg/0.3 mL IJ SOAJ injection INJECT 0.3 MLS (0.3 MG TOTAL) INTO THE  MUSCLE ONCE FOR 1 DOSE  ? famotidine (PEPCID) 40 MG tablet TAKE 1 TABLET AT BEDTIME AS NEEDED FOR HEARTBURN OR INDIGESTION  ? fenofibrate 160 MG tablet TAKE 1 TABLET DAILY  ? Ferrous Gluconate-C-Folic Acid (IRON-C PO) Take 1 tablet by mouth daily. Iron 65 mg and vit C-125 mg  ? fluticasone (FLONASE) 50 MCG/ACT nasal spray Place 2 sprays into both nostrils daily.  ? gabapentin (NEURONTIN) 300 MG capsule TAKE 1 CAPSULE TWICE A DAY AND 2 CAPSULES AT BEDTIME  ? glucose blood (FREESTYLE LITE) test strip Use to check blood sugar 4 times day.  Dx Code: E11.9  ? glyBURIDE (DIABETA) 5 MG tablet Take 1 tablet (5 mg total) by mouth daily.  ? insulin lispro (HUMALOG) 100 UNIT/ML KwikPen Inject 2-6 Units into the skin 3 (three) times daily. Per sliding scale.  ?  Insulin Pen Needle (PEN NEEDLES 31GX5/16") 31G X 8 MM MISC Use as directed with Humalog 3 times a day and at bedtime  ? LIVALO 2 MG TABS TAKE 1 TABLET DAILY  ? losartan (COZAAR) 50 MG tablet Take 1 tablet (50 mg total) by mouth daily. Please keep upcoming appt in March 2023 with Cardiologist before anymore refills. Thank you  ? Magnesium 100 MG CAPS Take 1 capsule (100 mg total) by mouth daily.  ? metoprolol tartrate (LOPRESSOR) 100 MG tablet TAKE 1 TABLET TWICE A DAY  ? mometasone (ELOCON) 0.1 % cream Apply 1 application topically daily. (Patient taking differently: Apply 1 application. topically daily as needed (poison ivy).)  ? Multiple Vitamin (MULTIVITAMIN) tablet Take 1 tablet by mouth daily. BARIATRIC VIT  ? oxycodone (OXY-IR) 5 MG capsule Take 1 mg by mouth 3 (three) times daily. PRN  ? pantoprazole (PROTONIX) 40 MG tablet TAKE 1 TABLET TWICE A DAY BEFORE MEALS  ? sodium chloride (OCEAN) 0.65 % SOLN nasal spray Place 1 spray into both nostrils as needed for congestion (nose irritation).  ?  ? ?Allergies:   Bee venom, Lipitor [atorvastatin], Morphine, Metformin and related, Robaxin [methocarbamol], Baclofen, Medrol [methylprednisolone], Simvastatin, Hydrocodone,  and Pravastatin  ? ?Social History  ? ?Socioeconomic History  ? Marital status: Married  ?  Spouse name: Not on file  ? Number of children: 2  ? Years of education: Not on file  ? Highest education level

## 2021-07-15 ENCOUNTER — Encounter: Payer: Self-pay | Admitting: Nurse Practitioner

## 2021-07-15 ENCOUNTER — Other Ambulatory Visit: Payer: Self-pay

## 2021-07-15 ENCOUNTER — Ambulatory Visit (INDEPENDENT_AMBULATORY_CARE_PROVIDER_SITE_OTHER): Payer: Medicare Other | Admitting: Nurse Practitioner

## 2021-07-15 VITALS — BP 142/78 | HR 87 | Ht 68.0 in | Wt 258.2 lb

## 2021-07-15 DIAGNOSIS — R0609 Other forms of dyspnea: Secondary | ICD-10-CM | POA: Diagnosis not present

## 2021-07-15 DIAGNOSIS — R002 Palpitations: Secondary | ICD-10-CM

## 2021-07-15 DIAGNOSIS — I2510 Atherosclerotic heart disease of native coronary artery without angina pectoris: Secondary | ICD-10-CM

## 2021-07-15 DIAGNOSIS — E782 Mixed hyperlipidemia: Secondary | ICD-10-CM

## 2021-07-15 DIAGNOSIS — I10 Essential (primary) hypertension: Secondary | ICD-10-CM | POA: Diagnosis not present

## 2021-07-15 NOTE — Patient Instructions (Signed)
Medication Instructions:  ? ?Your physician recommends that you continue on your current medications as directed. Please refer to the Current Medication list given to you today. ? ? ?*If you need a refill on your cardiac medications before your next appointment, please call your pharmacy* ? ? ?Testing/Procedures: ? ?You are scheduled for a Myocardial Perfusion Imaging Study on Friday, April 7 at 10:00 am.  ? ?Please arrive 15 minutes prior to your appointment time for registration and insurance purposes.  ? ?The test will take approximately 3 to 4 hours to complete; you may bring reading material. If someone comes with you to your appointment, they will need to remain in the main lobby due to limited space in the testing area.  ? ?How to prepare for your Myocardial Perfusion test: ? ? Do not eat or drink 3 hours prior to your test, except you may have water.  ? ? Do not consume products containing caffeine (regular or decaffeinated) 12 hours prior to your test (ex: coffee, chocolate, soda, tea) ? ? Do bring a list of your current medications with you. If not listed below, you may take your medications as normal.  ? ? Bring any held medication to your appointment, as you may be required to take it once the test is complete.  ? ?Do wear comfortable clothes (no overalls) and walking shoes. Tennis shoes are preferred. No open toed shoes. ? ?Do not wear cologne, aftershave or lotions (deodorant is allowed).  ? ?If these instructions are not followed, you test will have to be rescheduled.  ? ?Please report to [address] for your test. If you have questions or concerns about your appointment, please call the Nuclear Lab at [phone]. ? ?If you cannot keep your appointment, please provide 24 hour notification to the Nuclear lab to avoid a possible $50 charge to your account.  ? ?  ? ? ?Follow-Up: ?At [facility], you and your health needs are our priority.  As part of our continuing mission to  provide you with exceptional heart care, we have created designated Provider Care Teams.  These Care Teams include your primary Cardiologist (physician) and Advanced Practice Providers (APPs -  Physician Assistants and Nurse Practitioners) who all work together to provide you with the care you need, when you need it. ? ?We recommend signing up for the patient portal called "MyChart".  Sign up information is provided on this After Visit Summary.  MyChart is used to connect with patients for Virtual Visits (Telemedicine).  Patients are able to view lab/test results, encounter notes, upcoming appointments, etc.  Non-urgent messages can be sent to your provider as well.   ?To learn more about what you can do with MyChart, go to [email or web address].   ? ?Your next appointment:   ?1 year(s) ? ?The format for your next appointment:   ?In Person ? ?Provider:   ?Candee Furbish, MD   ? ? ?Other Instructions ? ? ?Your physician wants you to follow-up in: 1 year with Dr. Marlou Porch.  You will receive a reminder letter in the mail two months in advance. If you don't receive a letter, please call our office to schedule the follow-up appointment. ? ?Mediterranean Diet ?A Mediterranean diet refers to food and lifestyle choices that are based on the traditions of countries located on the [employer]. It focuses on eating more fruits, vegetables, whole grains, beans, nuts, seeds, and heart-healthy fats, and eating less dairy, meat, eggs, and processed foods with added  sugar, salt, and fat. This way of eating has been shown to help prevent certain conditions and improve outcomes for people who have chronic diseases, like kidney disease and heart disease. ?What are tips for following this plan? ?Reading food labels ?Check the serving size of packaged foods. For foods such as rice and pasta, the serving size refers to the amount of cooked product, not dry. ?Check the total fat in packaged foods. Avoid foods that have saturated fat or  trans fats. ?Check the ingredient list for added sugars, such as corn syrup. ?Shopping ? ?Buy a variety of foods that offer a balanced diet, including: ?Fresh fruits and vegetables (produce). ?Grains, beans, nuts, and seeds. Some of these may be available in unpackaged forms or large amounts (in bulk). ?Fresh seafood. ?Poultry and eggs. ?Low-fat dairy products. ?Buy whole ingredients instead of prepackaged foods. ?Buy fresh fruits and vegetables in-season from local farmers markets. ?Buy plain frozen fruits and vegetables. ?If you do not have access to quality fresh seafood, buy precooked frozen shrimp or canned fish, such as tuna, salmon, or sardines. ?Stock your pantry so you always have certain foods on hand, such as olive oil, canned tuna, canned tomatoes, rice, pasta, and beans. ?Cooking ?Cook foods with extra-virgin olive oil instead of using butter or other vegetable oils. ?Have meat as a side dish, and have vegetables or grains as your main dish. This means having meat in small portions or adding small amounts of meat to foods like pasta or stew. ?Use beans or vegetables instead of meat in common dishes like chili or lasagna. ?Experiment with different cooking methods. Try roasting, broiling, steaming, and saut?ing vegetables. ?Add frozen vegetables to soups, stews, pasta, or rice. ?Add nuts or seeds for added healthy fats and plant protein at each meal. You can add these to yogurt, salads, or vegetable dishes. ?Marinate fish or vegetables using olive oil, lemon juice, garlic, and fresh herbs. ?Meal planning ?Plan to eat one vegetarian meal one day each week. Try to work up to two vegetarian meals, if possible. ?Eat seafood two or more times a week. ?Have healthy snacks readily available, such as: ?Vegetable sticks with hummus. ?[place] yogurt. ?Fruit and nut trail mix. ?Eat balanced meals throughout the week. This includes: ?Fruit: 2-3 servings a day. ?Vegetables: 4-5 servings a day. ?Low-fat dairy: 2  servings a day. ?Fish, poultry, or lean meat: 1 serving a day. ?Beans and legumes: 2 or more servings a week. ?Nuts and seeds: 1-2 servings a day. ?Whole grains: 6-8 servings a day. ?Extra-virgin olive oil: 3-4 servings a day. ?Limit red meat and sweets to only a few servings a month. ?Lifestyle ? ?Cook and eat meals together with your family, when possible. ?Drink enough fluid to keep your urine pale yellow. ?Be physically active every day. This includes: ?Aerobic exercise like running or swimming. ?Leisure activities like gardening, walking, or housework. ?Get 7-8 hours of sleep each night. ?If recommended by your health care provider, drink red wine in moderation. This means 1 glass a day for nonpregnant women and 2 glasses a day for men. A glass of wine equals 5 oz (150 mL). ?What foods should I eat? ?Fruits ?Apples. Apricots. Avocado. Berries. Bananas. Cherries. Dates. Figs. Grapes. Lemons. Melon. Oranges. Peaches. Plums. Pomegranate. ?Vegetables ?Artichokes. Beets. Broccoli. Cabbage. Carrots. Eggplant. Green beans. Chard. Kale. Spinach. Onions. Leeks. Peas. Squash. Tomatoes. Peppers. Radishes. ?Grains ?Whole-grain pasta. Brown rice. Bulgur wheat. Polenta. Couscous. Whole-wheat bread. [place]. ?Meats and other proteins ?Beans. Almonds. Sunflower seeds. [employer]  nuts. Peanuts. [place]. Salmon. Scallops. Shrimp. [place]. Tilapia. Clams. Oysters. Eggs. Poultry without skin. ?Dairy ?Low-fat milk. Cheese. Greek yogurt. ?Fats and oils ?Extra-virgin olive oil. Avocado oil. Grapeseed oil. ?Beverages ?Water. Red wine. Herbal tea. ?Sweets and desserts ?Greek yogurt with honey. Baked apples. Poached pears. Trail mix. ?Seasonings and condiments ?Bas[place]. Cilantro. Coriander. Cumin. Mint. Parsley. Sage. Rosemary. Tarragon. Garlic. Oregano. Thyme. Pepper. Balsamic vinegar. Tahini. Hummus. Tomato sauce. Olives. Mushrooms. ?The items listed above may not be a complete list of foods and beverages you can eat. Contact a dietitian for  more information. ?What foods should I limit? ?This is a list of foods that should be eaten rarely or only on special occasions. ?Fruits ?Fruit canned in syrup. ?Vegetables ?Deep-fried potatoes (french fr

## 2021-07-17 ENCOUNTER — Encounter: Payer: Self-pay | Admitting: Gastroenterology

## 2021-07-17 ENCOUNTER — Ambulatory Visit (INDEPENDENT_AMBULATORY_CARE_PROVIDER_SITE_OTHER): Payer: Medicare Other | Admitting: Gastroenterology

## 2021-07-17 VITALS — BP 150/90 | HR 93 | Ht 68.0 in | Wt 263.0 lb

## 2021-07-17 DIAGNOSIS — Z86010 Personal history of colonic polyps: Secondary | ICD-10-CM

## 2021-07-17 DIAGNOSIS — D509 Iron deficiency anemia, unspecified: Secondary | ICD-10-CM | POA: Diagnosis not present

## 2021-07-17 DIAGNOSIS — K219 Gastro-esophageal reflux disease without esophagitis: Secondary | ICD-10-CM

## 2021-07-17 MED ORDER — NA SULFATE-K SULFATE-MG SULF 17.5-3.13-1.6 GM/177ML PO SOLN: 1.0000 | Freq: Once | ORAL | 0 refills | Status: AC

## 2021-07-17 NOTE — Patient Instructions (Signed)
You have been scheduled for an endoscopy and colonoscopy. Please follow the written instructions given to you at your visit today. ?Please pick up your prep supplies at the pharmacy within the next 1-3 days. ?If you use inhalers (even only as needed), please bring them with you on the day of your procedure. ? ?If you are age 66 or older, your body mass index should be between 23-30. Your Body mass index is 39.99 kg/m?[name] If this is out of the aforementioned range listed, please consider follow up with your Primary Care Provider. ?________________________________________________________ ? ?The  GI providers would like to encourage you to use [facility] to communicate with providers for non-urgent requests or questions.  Due to long hold times on the telephone, sending your provider a message by [facility] may be a faster and more efficient way to get a response.  Please allow 48 business hours for a response.  Please remember that this is for non-urgent requests.  ?_______________________________________________________ ? ?

## 2021-07-17 NOTE — Progress Notes (Signed)
? ? ? ?07/17/2021 ?Brent [name] ?[id number] ?1956/02/24 ? ? ?HISTORY OF PRESENT ILLNESS: This is a 66 year old male who is a patient of Dr. Blanch Media.  He was last seen by me in July 2022 for a "cyst" near his anus.  He ended up having an MRI of his pelvis and that did not show any abscess or fistula.  This was thought to just be a sinus tract.  We said that he could be referred to surgery if he wanted to have that evaluated, but otherwise can do some sitz bath's, etc.  He says that it is still there and about every 4 months or so it will kind of burst and drain and then is fine for a while.  He says it is uncomfortable and hurts if he sits for long periods of time.  He recently had some bright red rectal bleeding.  He said that he had been on Robaxin for a long time and so discontinued that.  He has not had any further bleeding since discontinuing the Robaxin. ? ?Last colonoscopy was April 2018 at which time he had a 2 mm polyp removed internal hemorrhoids.  This was a tubular adenoma and he was placed in for a 5-year colonoscopy recall. ? ?Last EGD was in May 2020 at which time it was a follow-up from an endoscopy in March 2020.  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  He does not use NSAIDs. ? ?He has battled with at least some mild anemia and iron deficiency over time, at least in part due to absorption issues from his bypass surgery.  Last hemoglobin is actually 13.5 g, which is the highest that it has been.  Last iron studies were in January were okay as well, but he did receive some IV iron infusions back in January.  He also takes a oral iron and vitamin C bariatric supplement daily as well. ? ? ?Past Medical History:  ?Diagnosis Date  ? Acid reflux disease   ? ACID REFLUX DISEASE 07/05/2007  ? Acute gastric ulcer with bleeding   ? Alcohol abuse   ? Anemia   ? Atherosclerosis   ? Benign  neoplasm of colon 07/05/2007  ? Bilateral hip pain 10/05/2016  ? Bladder cancer ([place])   ? Breast pain, left 11/17/2011  ? CAD (coronary artery disease)   ? Chest pain   ? a. Reportedly negative dobut echo performed prior to gastric bypass in 03/2011;  b. 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 12/2011 Cath: LM nl, LAD 50p, D1 4m LCX min irregs, OM3 30, RCA 25p, 741mFFR 0.99->0.89), PDA 30, EF 65%, Med Rx.  ? COLONIC POLYPS, HX OF 10/29/2008  ? Diarrhea 06/13/2010  ? Diverticulosis 07/05/2018  ? DIVERTICULOSIS, COLON 10/29/2008  ? DM (diabetes mellitus), type 2, uncontrolled   ? GI bleed   ? Gout 03/04/2013  ? Hearing loss 05/24/2013  ? Previous audiology evaluation completely.  ? HTN (hypertension) 07/14/2010  ? Hx of colonic polyps   ? HYPERSOMNIA, ASSOCIATED WITH SLEEP APNEA 07/26/2008  ? Hypertension 07/14/2010  ? 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 Internal hemorrhoids   ? Knee pain, left 10/10/2010  ? [place]  ? a. 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  ? Neck pain 03/2015  ? Other and unspecified hyperlipidemia 11/16/2012  ? Post-operative nausea and vomiting   ?  after the surgery in hospital in March 2020/ past the cauderization  ? Preventative health care 11/17/2011  ? Renal stone 11/2013  ? Sleep apnea   ? a. CPAP  ? Spinal stenosis   ? Tear of meniscus of left knee 2012  ? ?Past Surgical History:  ?Procedure Laterality Date  ? ANKLE SURGERY Right 1994  ? BICEPS TENDON REPAIR    ? left side  ? CARDIAC CATHETERIZATION    ? denies any chest pain in the past 2 years  ? COLONOSCOPY    ? colonoscopy polyps    ? ESOPHAGOGASTRODUODENOSCOPY N/A 03/27/2013  ? Procedure: ESOPHAGOGASTRODUODENOSCOPY (EGD);  Surgeon: Irene Shipper, MD;  Location: [facility];  Service: Endoscopy;  Laterality: N/A;  ? ESOPHAGOGASTRODUODENOSCOPY (EGD) WITH PROPOFOL N/A 07/06/2018  ? Procedure: ESOPHAGOGASTRODUODENOSCOPY (EGD) WITH PROPOFOL;  Surgeon: Jerene Bears, MD;  Location: [facility];  Service: Gastroenterology;   Laterality: N/A;  ? GASTRIC BYPASS    ? HERNIA REPAIR    ? HOT HEMOSTASIS N/A 07/06/2018  ? Procedure: HOT HEMOSTASIS (ARGON PLASMA COAGULATION/BICAP);  Surgeon: Jerene Bears, MD;  Location: [facility];  Service: Gastroenterology;  Laterality: N/A;  ? KNEE ARTHROSCOPY Left 11/06/2010  ? Left, torn meniscus (repaired)  ? LEFT HEART CATHETERIZATION WITH CORONARY ANGIOGRAM N/A 12/23/2011  ? Procedure: LEFT HEART CATHETERIZATION WITH CORONARY ANGIOGRAM;  Surgeon: Minus Breeding, MD;  Location: [facility] CATH LAB;  Service: Cardiovascular;  Laterality: N/A;  ? REPLACEMENT TOTAL KNEE Right 01/2016  ? removed scar tissue/ cut tip of nerve bundle and re-route  ? right knee arthroscopy Right 07/05/14  ? Dr. Hart Robinsons, [place]  2019  ? left  ? SCHLEROTHERAPY  07/06/2018  ? Procedure: SCHLEROTHERAPY;  Surgeon: Jerene Bears, MD;  Location: [facility];  Service: Gastroenterology;;  ? TONSILLECTOMY  age 54  ? TONSILLECTOMY    ? as a child  ? TOTAL KNEE ARTHROPLASTY Right 01/20/2016  ? Procedure: RIGHT TOTAL KNEE ARTHROPLASTY;  Surgeon: Paralee Cancel, MD;  Location: [facility];  Service: Orthopedics;  Laterality: Right;  ? TRANSURETHRAL RESECTION OF BLADDER TUMOR N/A 09/24/2020  ? Procedure: TRANSURETHRAL RESECTION OF BLADDER TUMOR (TURBT)  RIGHT retrograde pylegram;  Surgeon: [name], MD;  Location: [facility];  Service: Urology;  Laterality: N/A;  ? UPPER GI ENDOSCOPY  03/27/13  ? ? reports that he quit smoking about 30 years ago. His smoking use included cigarettes. He has a 30.00 pack-year smoking history. He has never used smokeless tobacco. He reports current alcohol use of about 3.0 standard drinks per week. He reports that he does not use drugs. ?family history includes ADD / ADHD in his daughter; Colon polyps in his father; Diabetes in his brother, brother, brother, mother, and sister; Heart attack in his brother and brother; Heart attack (age of onset: 66) in his father; Heart disease in his brother,  brother, and brother; Hyperlipidemia in his mother; Hypertension in his father, maternal grandmother, and mother; Obesity in his brother; Stroke in his father, mother, and sister. ?Allergies  ?Allergen Reactions  ? Bee Venom Anaphylaxis  ? Lipitor [Atorvastatin] Other (See Comments)  ?  Myalgias, memory changes  ? Morphine Other (See Comments)  ?  hyperactive  ? Metformin And Related Other (See Comments)  ?  Myalgias and weakness  ? Robaxin [Methocarbamol]   ?  GI bleed ?  ? Baclofen   ?  Acted drunk  ? Medrol [Methylprednisolone]   ?  Hyperglycemia   ? Simvastatin   ?  Joint pain  ? Hydrocodone Itching  ? Pravastatin Other (See Comments)  ?  Made joints  ? ? ?  ?Outpatient Encounter Medications as of 07/17/2021  ?Medication Sig  ? acetaminophen (TYLENOL) 500 MG tablet Take 1,000 mg by mouth every 6 (six) hours as needed for moderate pain or headache.  ? amitriptyline (ELAVIL) 50 MG tablet TAKE 1 TABLET AT BEDTIME  ? amoxicillin (AMOXIL) 500 MG capsule TAKE 4 CAPSULES BY MOUTH PRIOR TO APPOINTMENT  ? cetirizine (ZYRTEC) 10 MG tablet Take 10 mg by mouth at bedtime.  ? Cholecalciferol (DIALYVITE VITAMIN D 5000) 125 MCG (5000 UT) capsule Take 5,000 Units by mouth daily.  ? Cyanocobalamin (VITAMIN B-12) 1000 MCG SUBL Place 1,000 mcg under the tongue once a week.  ? Dulaglutide (TRULICITY) 1.5 ML/4.6TK SOPN Trulicity 1.5 PT/4.6 mL subcutaneous pen injector  ? econazole nitrate 1 % cream Apply topically daily.  ? EPINEPHRINE 0.3 mg/0.3 mL IJ SOAJ injection INJECT 0.3 MLS (0.3 MG TOTAL) INTO THE MUSCLE ONCE FOR 1 DOSE  ? famotidine (PEPCID) 40 MG tablet TAKE 1 TABLET AT BEDTIME AS NEEDED FOR HEARTBURN OR INDIGESTION  ? fenofibrate 160 MG tablet TAKE 1 TABLET DAILY  ? Ferrous Gluconate-C-Folic Acid (IRON-C PO) Take 1 tablet by mouth daily. Iron 65 mg and vit C-125 mg  ? fluticasone (FLONASE) 50 MCG/ACT nasal spray Place 2 sprays into both nostrils daily.  ? gabapentin (NEURONTIN) 300 MG capsule TAKE 1 CAPSULE TWICE A DAY AND  2 CAPSULES AT BEDTIME  ? glucose blood (FREESTYLE LITE) test strip Use to check blood sugar 4 times day.  Dx Code: E11.9  ? glyBURIDE (DIABETA) 5 MG tablet Take 1 tablet (5 mg total) by mouth daily.

## 2021-07-18 DIAGNOSIS — Z79899 Other long term (current) drug therapy: Secondary | ICD-10-CM | POA: Diagnosis not present

## 2021-07-18 DIAGNOSIS — M4804 Spinal stenosis, thoracic region: Secondary | ICD-10-CM | POA: Diagnosis not present

## 2021-07-18 DIAGNOSIS — M5412 Radiculopathy, cervical region: Secondary | ICD-10-CM | POA: Diagnosis not present

## 2021-07-18 DIAGNOSIS — Z5181 Encounter for therapeutic drug level monitoring: Secondary | ICD-10-CM | POA: Diagnosis not present

## 2021-07-18 DIAGNOSIS — M5030 Other cervical disc degeneration, unspecified cervical region: Secondary | ICD-10-CM | POA: Diagnosis not present

## 2021-07-18 DIAGNOSIS — M5134 Other intervertebral disc degeneration, thoracic region: Secondary | ICD-10-CM | POA: Diagnosis not present

## 2021-07-18 NOTE — Progress Notes (Signed)
Assessment and plans reviewed  

## 2021-07-21 ENCOUNTER — Telehealth (HOSPITAL_COMMUNITY): Payer: Self-pay | Admitting: *Deleted

## 2021-07-21 NOTE — Telephone Encounter (Signed)
Patient given detailed instructions per Myocardial Perfusion Study Information Sheet for the test on 07/25/2021 at 10:00. Patient notified to arrive 15 minutes early and that it is imperative to arrive on time for appointment to keep from having the test rescheduled. ? If you need to cancel or reschedule your appointment, please call the office within 24 hours of your appointment. . Patient verbalized understanding.Brent [name] ? ? ?

## 2021-07-25 ENCOUNTER — Ambulatory Visit (HOSPITAL_COMMUNITY): Payer: Medicare Other | Attending: Internal Medicine

## 2021-07-25 DIAGNOSIS — I2510 Atherosclerotic heart disease of native coronary artery without angina pectoris: Secondary | ICD-10-CM | POA: Insufficient documentation

## 2021-07-25 DIAGNOSIS — R002 Palpitations: Secondary | ICD-10-CM | POA: Diagnosis not present

## 2021-07-25 DIAGNOSIS — E782 Mixed hyperlipidemia: Secondary | ICD-10-CM

## 2021-07-25 DIAGNOSIS — I10 Essential (primary) hypertension: Secondary | ICD-10-CM | POA: Diagnosis not present

## 2021-07-25 DIAGNOSIS — R0609 Other forms of dyspnea: Secondary | ICD-10-CM | POA: Insufficient documentation

## 2021-07-25 LAB — MYOCARDIAL PERFUSION IMAGING
LV dias vol: 117 mL (ref 62–150)
LV sys vol: 44 mL
Nuc Stress EF: 62 %
Peak HR: 90 {beats}/min
Rest HR: 68 {beats}/min
Rest Nuclear Isotope Dose: 10.7 mCi
SDS: 0
SRS: 0
SSS: 0
ST Depression (mm): 0 mm
Stress Nuclear Isotope Dose: 32.1 mCi
TID: 0.92

## 2021-07-25 MED ORDER — REGADENOSON 0.4 MG/5ML IV SOLN
0.4000 mg | Freq: Once | INTRAVENOUS | Status: AC
  Administered 2021-07-25: 0.4 mg via INTRAVENOUS

## 2021-07-25 MED ORDER — TECHNETIUM TC 99M TETROFOSMIN IV KIT
32.1000 | PACK | Freq: Once | INTRAVENOUS | Status: AC | PRN
  Administered 2021-07-25: 32.1 via INTRAVENOUS
  Filled 2021-07-25: qty 33

## 2021-07-25 MED ORDER — TECHNETIUM TC 99M TETROFOSMIN IV KIT
10.7000 | PACK | Freq: Once | INTRAVENOUS | Status: AC | PRN
  Administered 2021-07-25: 10.7 via INTRAVENOUS
  Filled 2021-07-25: qty 11

## 2021-08-01 DIAGNOSIS — M542 Cervicalgia: Secondary | ICD-10-CM | POA: Diagnosis not present

## 2021-08-07 ENCOUNTER — Other Ambulatory Visit: Payer: Self-pay | Admitting: Cardiology

## 2021-08-14 DIAGNOSIS — M5412 Radiculopathy, cervical region: Secondary | ICD-10-CM | POA: Diagnosis not present

## 2021-08-14 DIAGNOSIS — M5134 Other intervertebral disc degeneration, thoracic region: Secondary | ICD-10-CM | POA: Diagnosis not present

## 2021-08-14 DIAGNOSIS — M5030 Other cervical disc degeneration, unspecified cervical region: Secondary | ICD-10-CM | POA: Diagnosis not present

## 2021-08-14 DIAGNOSIS — M542 Cervicalgia: Secondary | ICD-10-CM | POA: Diagnosis not present

## 2021-08-14 DIAGNOSIS — G894 Chronic pain syndrome: Secondary | ICD-10-CM | POA: Diagnosis not present

## 2021-08-14 DIAGNOSIS — M4804 Spinal stenosis, thoracic region: Secondary | ICD-10-CM | POA: Diagnosis not present

## 2021-08-25 ENCOUNTER — Encounter: Payer: Self-pay | Admitting: Family

## 2021-08-27 ENCOUNTER — Encounter: Payer: Self-pay | Admitting: Family

## 2021-08-27 ENCOUNTER — Inpatient Hospital Stay: Payer: Medicare Other | Attending: Hematology & Oncology

## 2021-08-27 ENCOUNTER — Inpatient Hospital Stay (HOSPITAL_BASED_OUTPATIENT_CLINIC_OR_DEPARTMENT_OTHER): Payer: Medicare Other | Admitting: Family

## 2021-08-27 VITALS — BP 127/66 | HR 71 | Temp 98.1°F | Resp 18 | Wt 260.0 lb

## 2021-08-27 DIAGNOSIS — Z79899 Other long term (current) drug therapy: Secondary | ICD-10-CM | POA: Insufficient documentation

## 2021-08-27 DIAGNOSIS — K909 Intestinal malabsorption, unspecified: Secondary | ICD-10-CM | POA: Insufficient documentation

## 2021-08-27 DIAGNOSIS — D509 Iron deficiency anemia, unspecified: Secondary | ICD-10-CM | POA: Insufficient documentation

## 2021-08-27 DIAGNOSIS — R5383 Other fatigue: Secondary | ICD-10-CM | POA: Diagnosis not present

## 2021-08-27 DIAGNOSIS — D500 Iron deficiency anemia secondary to blood loss (chronic): Secondary | ICD-10-CM | POA: Diagnosis not present

## 2021-08-27 DIAGNOSIS — R0602 Shortness of breath: Secondary | ICD-10-CM | POA: Insufficient documentation

## 2021-08-27 LAB — IRON AND IRON BINDING CAPACITY (CC-WL,HP ONLY)
Iron: 75 ug/dL (ref 45–182)
Saturation Ratios: 20 % (ref 17.9–39.5)
TIBC: 378 ug/dL (ref 250–450)
UIBC: 303 ug/dL (ref 117–376)

## 2021-08-27 LAB — RETICULOCYTES
Immature Retic Fract: 22.8 % — ABNORMAL HIGH (ref 2.3–15.9)
RBC.: 3.8 MIL/uL — ABNORMAL LOW (ref 4.22–5.81)
Retic Count, Absolute: 105.3 10*3/uL (ref 19.0–186.0)
Retic Ct Pct: 2.8 % (ref 0.4–3.1)

## 2021-08-27 LAB — CBC WITH DIFFERENTIAL (CANCER CENTER ONLY)
Abs Immature Granulocytes: 0.07 10*3/uL (ref 0.00–0.07)
Basophils Absolute: 0.1 10*3/uL (ref 0.0–0.1)
Basophils Relative: 1 %
Eosinophils Absolute: 0.1 10*3/uL (ref 0.0–0.5)
Eosinophils Relative: 2 %
HCT: 35 % — ABNORMAL LOW (ref 39.0–52.0)
Hemoglobin: 11.4 g/dL — ABNORMAL LOW (ref 13.0–17.0)
Immature Granulocytes: 1 %
Lymphocytes Relative: 26 %
Lymphs Abs: 1.4 10*3/uL (ref 0.7–4.0)
MCH: 29.6 pg (ref 26.0–34.0)
MCHC: 32.6 g/dL (ref 30.0–36.0)
MCV: 90.9 fL (ref 80.0–100.0)
Monocytes Absolute: 0.7 10*3/uL (ref 0.1–1.0)
Monocytes Relative: 13 %
Neutro Abs: 3 10*3/uL (ref 1.7–7.7)
Neutrophils Relative %: 57 %
Platelet Count: 213 10*3/uL (ref 150–400)
RBC: 3.85 MIL/uL — ABNORMAL LOW (ref 4.22–5.81)
RDW: 13 % (ref 11.5–15.5)
WBC Count: 5.4 10*3/uL (ref 4.0–10.5)
nRBC: 0 % (ref 0.0–0.2)

## 2021-08-27 LAB — FERRITIN: Ferritin: 119 ng/mL (ref 24–336)

## 2021-08-27 NOTE — Progress Notes (Signed)
?Hematology and Oncology Follow Up Visit ? ?Brent [name] ?[id number] ?1956-04-13 67 y.o. ?08/27/2021 ? ? ?Principle Diagnosis:  ?Iron deficiency anemia secondary to malabsorption and history of acute GI bleed ?  ?Current Therapy:        ?IV iron as indicated  ?  ?Interim History:  Brent [name] is here today for follow-up. He is doing well and has no complaints at this time.  ?He has not noted any blood loss. No bruising or petechiae.  ?He continues to follow-up regularly with urology for history of bladder cancer.  ?No fever, chills, n/v, cough, rash, dizziness, chest pain, palpitations, abdominal pain or changes in bowel or bladder habits.   ?He notes mild SOB and fatigue with over exertion. He will take a break to rest when needed.  ?No swelling, tenderness, numbness or tingling in his extremities at this time.  ?He occasionally has tingling in his fingertips that is associated with spinal stenosis.  ?No falls or syncope.  ?Appetite is good and he is staying well hydrated. His weight is stable at 260 lbs.  ? ?ECOG Performance Status: 1 - Symptomatic but completely ambulatory ? ?Medications:  ?Allergies as of 08/27/2021   ? ?   Reactions  ? Bee Venom Anaphylaxis  ? Lipitor [atorvastatin] Other (See Comments)  ? Myalgias, memory changes  ? Morphine Other (See Comments)  ? hyperactive  ? Metformin And Related Other (See Comments)  ? Myalgias and weakness  ? Robaxin [methocarbamol]   ? GI bleed  ? Baclofen   ? Acted drunk  ? Medrol [methylprednisolone]   ? Hyperglycemia   ? Simvastatin   ? Joint pain  ? Hydrocodone Itching  ? Pravastatin Other (See Comments)  ? Made joints  ? ?  ? ?  ?Medication List  ?  ? ?  ? Accurate as of Aug 27, 2021  8:39 AM. If you have any questions, ask your nurse or doctor.  ?  ?  ? ?  ? ?acetaminophen 500 MG tablet ?Commonly known as: TYLENOL ?Take 1,000 mg by mouth every 6 (six) hours as needed for moderate pain or headache. ?  ?amitriptyline 50 MG tablet ?Commonly known as: ELAVIL ?TAKE 1  TABLET AT BEDTIME ?  ?amoxicillin 500 MG capsule ?Commonly known as: AMOXIL ?TAKE 4 CAPSULES BY MOUTH PRIOR TO APPOINTMENT ?  ?cetirizine 10 MG tablet ?Commonly known as: ZYRTEC ?Take 10 mg by mouth at bedtime. ?  ?Dialyvite Vitamin D 5000 125 MCG (5000 UT) capsule ?Generic drug: Cholecalciferol ?Take 5,000 Units by mouth daily. ?  ?econazole nitrate 1 % cream ?Apply topically daily. ?  ?EPINEPHrine 0.3 mg/0.3 mL Soaj injection ?Commonly known as: EPI-PEN ?INJECT 0.3 MLS (0.3 MG TOTAL) INTO THE MUSCLE ONCE FOR 1 DOSE ?  ?famotidine 40 MG tablet ?Commonly known as: PEPCID ?TAKE 1 TABLET AT BEDTIME AS NEEDED FOR HEARTBURN OR INDIGESTION ?  ?fenofibrate 160 MG tablet ?TAKE 1 TABLET DAILY ?  ?fluticasone 50 MCG/ACT nasal spray ?Commonly known as: FLONASE ?Place 2 sprays into both nostrils daily. ?  ?FREESTYLE LITE test strip ?Generic drug: glucose blood ?Use to check blood sugar 4 times day.  Dx Code: E11.9 ?  ?gabapentin 300 MG capsule ?Commonly known as: NEURONTIN ?TAKE 1 CAPSULE TWICE A DAY AND 2 CAPSULES AT BEDTIME ?  ?glyBURIDE 5 MG tablet ?Commonly known as: DIABETA ?Take 1 tablet (5 mg total) by mouth daily. ?What changed: how much to take ?  ?insulin lispro 100 UNIT/ML KwikPen ?Commonly known as: HUMALOG ?Inject  2-6 Units into the skin 3 (three) times daily. Per sliding scale. ?  ?IRON-C PO ?Take 1 tablet by mouth daily. Iron 65 mg and vit C-125 mg ?  ?Livalo 2 MG Tabs ?Generic drug: Pitavastatin Calcium ?TAKE 1 TABLET DAILY ?  ?losartan 50 MG tablet ?Commonly known as: COZAAR ?Take 1 tablet (50 mg total) by mouth daily. Please keep upcoming appt in March 2023 with Cardiologist before anymore refills. Thank you ?  ?Magnesium 100 MG Caps ?Take 1 capsule (100 mg total) by mouth daily. ?  ?metoprolol tartrate 100 MG tablet ?Commonly known as: LOPRESSOR ?Take 1 tablet (100 mg total) by mouth 2 (two) times daily. ?  ?mometasone 0.1 % cream ?Commonly known as: Elocon ?Apply 1 application topically daily. ?What  changed:  ?when to take this ?reasons to take this ?  ?multivitamin tablet ?Take 1 tablet by mouth daily. BARIATRIC VIT ?  ?oxycodone 5 MG capsule ?Commonly known as: OXY-IR ?Take 1 mg by mouth 3 (three) times daily. PRN ?  ?pantoprazole 40 MG tablet ?Commonly known as: PROTONIX ?TAKE 1 TABLET TWICE A DAY BEFORE MEALS ?  ?PEN NEEDLES 31GX5/16" 31G X 8 MM Misc ?Use as directed with Humalog 3 times a day and at bedtime ?  ?sodium chloride 0.65 % Soln nasal spray ?Commonly known as: OCEAN ?Place 1 spray into both nostrils as needed for congestion (nose irritation). ?  ?Trulicity 1.5 TM/1.9QQ Sopn ?Generic drug: Dulaglutide ?Trulicity 1.5 IW/9.7 mL subcutaneous pen injector ?  ?Vitamin B-12 1000 MCG Subl ?Place 1,000 mcg under the tongue once a week. ?  ? ?  ? ? ?Allergies:  ?Allergies  ?Allergen Reactions  ? Bee Venom Anaphylaxis  ? Lipitor [Atorvastatin] Other (See Comments)  ?  Myalgias, memory changes  ? Morphine Other (See Comments)  ?  hyperactive  ? Metformin And Related Other (See Comments)  ?  Myalgias and weakness  ? Robaxin [Methocarbamol]   ?  GI bleed ?  ? Baclofen   ?  Acted drunk  ? Medrol [Methylprednisolone]   ?  Hyperglycemia   ? Simvastatin   ?  Joint pain  ? Hydrocodone Itching  ? Pravastatin Other (See Comments)  ?  Made joints  ? ? ?Past Medical History, Surgical history, Social history, and Family History were reviewed and updated. ? ?Review of Systems: ?All other 10 point review of systems is negative.  ? ?Physical Exam: ? weight is 260 lb (117.9 kg). His oral temperature is 98.1 ?F (36.7 ?C). His blood pressure is 127/66 and his pulse is 71. His respiration is 18 and oxygen saturation is 98%.  ? ?Wt Readings from Last 3 Encounters:  ?08/27/21 260 lb (117.9 kg)  ?07/25/21 258 lb (117 kg)  ?07/17/21 263 lb (119.3 kg)  ? ? ?Ocular: Sclerae unicteric, pupils equal, round and reactive to light ?Ear-nose-throat: Oropharynx clear, dentition fair ?Lymphatic: No cervical or supraclavicular  adenopathy ?Lungs no rales or rhonchi, good excursion bilaterally ?Heart regular rate and rhythm, no murmur appreciated ?Abd soft, nontender, positive bowel sounds ?MSK no focal spinal tenderness, no joint edema ?Neuro: non-focal, well-oriented, appropriate affect ?Breasts: Deferred  ? ?Lab Results  ?Component Value Date  ? WBC 5.4 08/27/2021  ? HGB 11.4 (L) 08/27/2021  ? HCT 35.0 (L) 08/27/2021  ? MCV 90.9 08/27/2021  ? PLT 213 08/27/2021  ? ?Lab Results  ?Component Value Date  ? FERRITIN 62 05/01/2021  ? IRON 71 05/01/2021  ? TIBC 405 05/01/2021  ? UIBC 334 05/01/2021  ?  IRONPCTSAT 18 05/01/2021  ? ?Lab Results  ?Component Value Date  ? RETICCTPCT 2.8 08/27/2021  ? RBC 3.80 (L) 08/27/2021  ? ?No results found for: KPAFRELGTCHN, LAMBDASER, KAPLAMBRATIO ?No results found for: IGGSERUM, IGA, IGMSERUM ?No results found for: TOTALPROTELP, ALBUMINELP, A1GS, A2GS, BETS, BETA2SER, GAMS, MSPIKE, SPEI ?  Chemistry   ?   ?Component Value Date/Time  ? NA 139 05/22/2021 0755  ? NA 137 07/18/2019 1454  ? K 4.2 05/22/2021 0755  ? CL 102 05/22/2021 0755  ? CO2 28 05/22/2021 0755  ? BUN 21 05/22/2021 0755  ? BUN 13 07/18/2019 1454  ? CREATININE 1.23 05/22/2021 0755  ? CREATININE 1.19 01/11/2020 1543  ?    ?Component Value Date/Time  ? CALCIUM 9.9 05/22/2021 0755  ? ALKPHOS 41 05/22/2021 0755  ? AST 24 05/22/2021 0755  ? AST 15 07/14/2018 1021  ? ALT 17 05/22/2021 0755  ? ALT 11 07/14/2018 1021  ? BILITOT 0.7 05/22/2021 0755  ? BILITOT 0.4 07/14/2018 1021  ?  ? ? ? ?Impression and Plan: Brent [name] is a very pleasant 66 yo caucasian gentleman with iron deficiency anemia secondary to malabsorption (gastric bypass) as well intermittent GI blood loss with ulcer.  ?Iron studies are pending.  ?Follow-up in 6 months.  ? ?Lottie Dawson, NP ?5/10/20238:39 AM  ?

## 2021-09-01 ENCOUNTER — Encounter: Payer: Self-pay | Admitting: Internal Medicine

## 2021-09-01 ENCOUNTER — Ambulatory Visit (AMBULATORY_SURGERY_CENTER): Payer: Medicare Other | Admitting: Internal Medicine

## 2021-09-01 VITALS — BP 150/85 | HR 80 | Temp 97.8°F | Resp 8 | Ht 68.0 in | Wt 263.0 lb

## 2021-09-01 DIAGNOSIS — D509 Iron deficiency anemia, unspecified: Secondary | ICD-10-CM | POA: Diagnosis not present

## 2021-09-01 DIAGNOSIS — K635 Polyp of colon: Secondary | ICD-10-CM | POA: Diagnosis not present

## 2021-09-01 DIAGNOSIS — Z86010 Personal history of colonic polyps: Secondary | ICD-10-CM | POA: Diagnosis not present

## 2021-09-01 DIAGNOSIS — K6389 Other specified diseases of intestine: Secondary | ICD-10-CM | POA: Diagnosis not present

## 2021-09-01 DIAGNOSIS — E119 Type 2 diabetes mellitus without complications: Secondary | ICD-10-CM | POA: Diagnosis not present

## 2021-09-01 DIAGNOSIS — K219 Gastro-esophageal reflux disease without esophagitis: Secondary | ICD-10-CM

## 2021-09-01 DIAGNOSIS — D120 Benign neoplasm of cecum: Secondary | ICD-10-CM

## 2021-09-01 DIAGNOSIS — D125 Benign neoplasm of sigmoid colon: Secondary | ICD-10-CM

## 2021-09-01 DIAGNOSIS — I10 Essential (primary) hypertension: Secondary | ICD-10-CM | POA: Diagnosis not present

## 2021-09-01 DIAGNOSIS — G4733 Obstructive sleep apnea (adult) (pediatric): Secondary | ICD-10-CM | POA: Diagnosis not present

## 2021-09-01 MED ORDER — SODIUM CHLORIDE 0.9 % IV SOLN: 500.0000 mL | Freq: Once | INTRAVENOUS | Status: DC

## 2021-09-01 NOTE — Op Note (Signed)
[place] ?Patient Name: Brent [name] ?Procedure Date: 09/01/2021 1:29 PM ?MRN: [id number] ?Endoscopist: Docia Chuck. Henrene Pastor , MD ?Age: 66 ?Referring MD:  ?Date of Birth: [birth year] ?Gender: Male ?Account #: [id number] ?Procedure:                Upper GI endoscopy ?Indications:              Iron deficiency anemia, Esophageal reflux ?Medicines:                Monitored Anesthesia Care ?Procedure:                Pre-Anesthesia Assessment: ?                          - Prior to the procedure, a History and Physical  ?                          was performed, and patient medications and  ?                          allergies were reviewed. The patient's tolerance of  ?                          previous anesthesia was also reviewed. The risks  ?                          and benefits of the procedure and the sedation  ?                          options and risks were discussed with the patient.  ?                          All questions were answered, and informed consent  ?                          was obtained. Prior Anticoagulants: The patient has  ?                          taken no previous anticoagulant or antiplatelet  ?                          agents. ASA Grade Assessment: II - A patient with  ?                          mild systemic disease. After reviewing the risks  ?                          and benefits, the patient was deemed in  ?                          satisfactory condition to undergo the procedure. ?                          After obtaining informed consent, the endoscope was  ?  passed under direct vision. Throughout the  ?                          procedure, the patient's blood pressure, pulse, and  ?                          oxygen saturations were monitored continuously. The  ?                          GIF [id number] #[id number] was introduced through the  ?                          mouth, and advanced to the jejunum. The upper GI  ?                          endoscopy was  accomplished without difficulty. The  ?                          patient tolerated the procedure well. ?Scope In: ?Scope Out: ?Findings:                 The esophagus was normal. ?                          The stomach revealed evidence of prior Roux-en-Y  ?                          gastric bypass surgery. ?                          The examined anastomosis was patent without  ?                          ulceration or other abnormality. ?                          The jejunum beyond the anastomosis was normal. ?Complications:            No immediate complications. ?Estimated Blood Loss:     Estimated blood loss: none. ?Impression:               1. Status post Roux-en-Y gastric bypass surgery.  ?                          Normal postoperative anatomy ?                          2. Iron deficiency anemia (resolved) secondary to  ?                          altered gastric anatomy with compromised iron  ?                          absorption. ?Recommendation:           - Patient has a contact number available for  ?  emergencies. The signs and symptoms of potential  ?                          delayed complications were discussed with the  ?                          patient. Return to normal activities tomorrow.  ?                          Written discharge instructions were provided to the  ?                          patient. ?                          - Resume previous diet. ?                          - Continue present medications. ?                          - Ongoing monitoring of blood counts and iron  ?                          levels per hematology ?Docia Chuck. Henrene Pastor, MD ?09/01/2021 2:00:27 PM ?This report has been signed electronically. ?

## 2021-09-01 NOTE — Progress Notes (Signed)
07/17/2021 ?Brent [name] ?[id number] ?[birth year] ?  ?  ?HISTORY OF PRESENT ILLNESS: This is a 66 year old male who is a patient of Dr. Blanch Media.  He was last seen by me in July 2022 for a "cyst" near his anus.  He ended up having an MRI of his pelvis and that did not show any abscess or fistula.  This was thought to just be a sinus tract.  We said that he could be referred to surgery if he wanted to have that evaluated, but otherwise can do some sitz bath's, etc.  He says that it is still there and about every 4 months or so it will kind of burst and drain and then is fine for a while.  He says it is uncomfortable and hurts if he sits for long periods of time.  He recently had some bright red rectal bleeding.  He said that he had been on Robaxin for a long time and so discontinued that.  He has not had any further bleeding since discontinuing the Robaxin. ?  ?Last colonoscopy was April 2018 at which time he had a 2 mm polyp removed internal hemorrhoids.  This was a tubular adenoma and he was placed in for a 5-year colonoscopy recall. ?  ?Last EGD was in May 2020 at which time it was a follow-up from an endoscopy in March 2020.  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  He does not use NSAIDs. ?  ?He has battled with at least some mild anemia and iron deficiency over time, at least in part due to absorption issues from his bypass surgery.  Last hemoglobin is actually 13.5 g, which is the highest that it has been.  Last iron studies were in January were okay as well, but he did receive some IV iron infusions back in January.  He also takes a oral iron and vitamin C bariatric supplement daily as well. ?  ?  ?    ?Past Medical History:  ?Diagnosis Date  ? Acid reflux disease    ? ACID REFLUX DISEASE 07/05/2007  ? Acute gastric ulcer with bleeding    ? Alcohol abuse    ? Anemia    ? Atherosclerosis    ?  Benign neoplasm of colon 07/05/2007  ? Bilateral hip pain 10/05/2016  ? Bladder cancer ([place])    ? Breast pain, left 11/17/2011  ? CAD (coronary artery disease)    ? Chest pain    ?  a. Reportedly negative dobut echo performed prior to gastric bypass in 03/2011;  b. 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 12/2011 Cath: LM nl, LAD 50p, D1 79m LCX min irregs, OM3 30, RCA 25p, 77mFFR 0.99->0.89), PDA 30, EF 65%, Med Rx.  ? COLONIC POLYPS, HX OF 10/29/2008  ? Diarrhea 06/13/2010  ? Diverticulosis 07/05/2018  ? DIVERTICULOSIS, COLON 10/29/2008  ? DM (diabetes mellitus), type 2, uncontrolled    ? GI bleed    ? Gout 03/04/2013  ? Hearing loss 05/24/2013  ?  Previous audiology evaluation completely.  ? HTN (hypertension) 07/14/2010  ? Hx of colonic polyps    ? HYPERSOMNIA, ASSOCIATED WITH SLEEP APNEA 07/26/2008  ? Hypertension 07/14/2010  ? 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 Internal hemorrhoids    ? Knee pain, left 10/10/2010  ? Morbid obesity ([facility]/11/2009  ?  a. s/p gastric  bypass 03/2011.  ? Neck pain 03/2015  ? Other and unspecified hyperlipidemia 11/16/2012  ? Post-operative nausea and vomiting    ?  after the surgery in hospital in March 2020/ past the cauderization  ? Preventative health care 11/17/2011  ? Renal stone 11/2013  ? Sleep apnea    ?  a. CPAP  ? Spinal stenosis    ? Tear of meniscus of left knee 2012  ?  ?     ?Past Surgical History:  ?Procedure Laterality Date  ? ANKLE SURGERY Right 1994  ? BICEPS TENDON REPAIR      ?  left side  ? CARDIAC CATHETERIZATION      ?  denies any chest pain in the past 2 years  ? COLONOSCOPY      ? colonoscopy polyps      ? ESOPHAGOGASTRODUODENOSCOPY N/A 03/27/2013  ?  Procedure: ESOPHAGOGASTRODUODENOSCOPY (EGD);  Surgeon: Irene Shipper, MD;  Location: [facility];  Service: Endoscopy;  Laterality: N/A;  ? ESOPHAGOGASTRODUODENOSCOPY (EGD) WITH PROPOFOL N/A 07/06/2018  ?  Procedure: ESOPHAGOGASTRODUODENOSCOPY (EGD) WITH PROPOFOL;  Surgeon: Jerene Bears, MD;  Location:  [facility];  Service: Gastroenterology;  Laterality: N/A;  ? GASTRIC BYPASS      ? HERNIA REPAIR      ? HOT HEMOSTASIS N/A 07/06/2018  ?  Procedure: HOT HEMOSTASIS (ARGON PLASMA COAGULATION/BICAP);  Surgeon: Jerene Bears, MD;  Location: [facility];  Service: Gastroenterology;  Laterality: N/A;  ? KNEE ARTHROSCOPY Left 11/06/2010  ?  Left, torn meniscus (repaired)  ? LEFT HEART CATHETERIZATION WITH CORONARY ANGIOGRAM N/A 12/23/2011  ?  Procedure: LEFT HEART CATHETERIZATION WITH CORONARY ANGIOGRAM;  Surgeon: Minus Breeding, MD;  Location: [facility] C[facility] LAB;  Service: Cardiovascular;  Laterality: N/A;  ? REPLACEMENT TOTAL KNEE Right 01/2016  ?  removed scar tissue/ cut tip of nerve bundle and re-route  ? right knee arthroscopy Right 07/05/14  ?  Dr. Hart Robinsons, [place]   2019  ?  left  ? SCHLEROTHERAPY   07/06/2018  ?  Procedure: SCHLEROTHERAPY;  Surgeon: Jerene Bears, MD;  Location: [facility];  Service: Gastroenterology;;  ? TONSILLECTOMY   age 83  ? TONSILLECTOMY      ?  as a child  ? TOTAL KNEE ARTHROPLASTY Right 01/20/2016  ?  Procedure: RIGHT TOTAL KNEE ARTHROPLASTY;  Surgeon: Paralee Cancel, MD;  Location: [facility];  Service: Orthopedics;  Laterality: Right;  ? TRANSURETHRAL RESECTION OF BLADDER TUMOR N/A 09/24/2020  ?  Procedure: TRANSURETHRAL RESECTION OF BLADDER TUMOR (TURBT)  RIGHT retrograde pylegram;  Surgeon: [name], MD;  Location: [facility];  Service: Urology;  Laterality: N/A;  ? UPPER GI ENDOSCOPY   03/27/13  ?  ? reports that he quit smoking about 30 years ago. His smoking use included cigarettes. He has a 30.00 pack-year smoking history. He has never used smokeless tobacco. He reports current alcohol use of about 3.0 standard drinks per week. He reports that he does not use drugs. ?family history includes ADD / ADHD in his daughter; Colon polyps in his father; Diabetes in his brother, brother, brother, mother, and sister; Heart attack in his brother and brother; Heart attack  (age of onset: 20) in his father; Heart disease in his brother, brother, and brother; Hyperlipidemia in his mother; Hypertension in his father, maternal grandmother, and mother; Obesity in his brother; Stroke in his father, mother, and sister. ?     ?Allergies  ?Allergen Reactions  ? Bee  Venom Anaphylaxis  ? Lipitor [Atorvastatin] Other (See Comments)  ?    Myalgias, memory changes  ? Morphine Other (See Comments)  ?    hyperactive  ? Metformin And Related Other (See Comments)  ?    Myalgias and weakness  ? Robaxin [Methocarbamol]    ?    GI bleed ?   ? Baclofen    ?    Acted drunk  ? Medrol [Methylprednisolone]    ?    Hyperglycemia   ? Simvastatin    ?    Joint pain  ? Hydrocodone Itching  ? Pravastatin Other (See Comments)  ?    Made joints  ?  ?  ?  ?    ?Outpatient Encounter Medications as of 07/17/2021  ?Medication Sig  ? acetaminophen (TYLENOL) 500 MG tablet Take 1,000 mg by mouth every 6 (six) hours as needed for moderate pain or headache.  ? amitriptyline (ELAVIL) 50 MG tablet TAKE 1 TABLET AT BEDTIME  ? amoxicillin (AMOXIL) 500 MG capsule TAKE 4 CAPSULES BY MOUTH PRIOR TO APPOINTMENT  ? cetirizine (ZYRTEC) 10 MG tablet Take 10 mg by mouth at bedtime.  ? Cholecalciferol (DIALYVITE VITAMIN D 5000) 125 MCG (5000 UT) capsule Take 5,000 Units by mouth daily.  ? Cyanocobalamin (VITAMIN B-12) 1000 MCG SUBL Place 1,000 mcg under the tongue once a week.  ? Dulaglutide (TRULICITY) 1.5 WI/0.9BD SOPN Trulicity 1.5 ZH/2.9 mL subcutaneous pen injector  ? econazole nitrate 1 % cream Apply topically daily.  ? EPINEPHRINE 0.3 mg/0.3 mL IJ SOAJ injection INJECT 0.3 MLS (0.3 MG TOTAL) INTO THE MUSCLE ONCE FOR 1 DOSE  ? famotidine (PEPCID) 40 MG tablet TAKE 1 TABLET AT BEDTIME AS NEEDED FOR HEARTBURN OR INDIGESTION  ? fenofibrate 160 MG tablet TAKE 1 TABLET DAILY  ? Ferrous Gluconate-C-Folic Acid (IRON-C PO) Take 1 tablet by mouth daily. Iron 65 mg and vit C-125 mg  ? fluticasone (FLONASE) 50 MCG/ACT nasal spray Place 2 sprays  into both nostrils daily.  ? gabapentin (NEURONTIN) 300 MG capsule TAKE 1 CAPSULE TWICE A DAY AND 2 CAPSULES AT BEDTIME  ? glucose blood (FREESTYLE LITE) test strip Use to check blood sugar 4 times d

## 2021-09-01 NOTE — Progress Notes (Signed)
Report to PACU, RN, vss, BBS= Clear.  

## 2021-09-01 NOTE — Progress Notes (Signed)
Called to room to assist during endoscopic procedure.  Patient ID and intended procedure confirmed with present staff. Received instructions for my participation in the procedure from the performing physician.  

## 2021-09-01 NOTE — Op Note (Signed)
[place] ?Patient Name: Brent [name] ?Procedure Date: 09/01/2021 1:30 PM ?MRN: [id number] ?Endoscopist: Docia Chuck. Henrene Pastor , MD ?Age: 66 ?Referring MD:  ?Date of Birth: [birth year] ?Gender: Male ?Account #: [id number] ?Procedure:                Colonoscopy with cold snare polypectomy x 2 ?Indications:              High risk colon cancer surveillance: Personal  ?                          history of multiple (3 or more) adenomas. Previous  ?                          examinations 1997, 2000, 2004, 2007, 2012, 2018 ?Medicines:                Monitored Anesthesia Care ?Procedure:                Pre-Anesthesia Assessment: ?                          - Prior to the procedure, a History and Physical  ?                          was performed, and patient medications and  ?                          allergies were reviewed. The patient's tolerance of  ?                          previous anesthesia was also reviewed. The risks  ?                          and benefits of the procedure and the sedation  ?                          options and risks were discussed with the patient.  ?                          All questions were answered, and informed consent  ?                          was obtained. Prior Anticoagulants: The patient has  ?                          taken no previous anticoagulant or antiplatelet  ?                          agents. ASA Grade Assessment: II - A patient with  ?                          mild systemic disease. After reviewing the risks  ?                          and benefits, the patient was deemed in  ?  satisfactory condition to undergo the procedure. ?                          After obtaining informed consent, the colonoscope  ?                          was passed under direct vision. Throughout the  ?                          procedure, the patient's blood pressure, pulse, and  ?                          oxygen saturations were monitored continuously. The  ?                           Colonoscope was introduced through the anus and  ?                          advanced to the the cecum, identified by  ?                          appendiceal orifice and ileocecal valve. The  ?                          ileocecal valve, appendiceal orifice, and rectum  ?                          were photographed. The quality of the bowel  ?                          preparation was excellent. The colonoscopy was  ?                          performed without difficulty. The patient tolerated  ?                          the procedure well. The bowel preparation used was  ?                          SUPREP via split dose instruction. ?Scope In: 1:31:54 PM ?Scope Out: 1:46:01 PM ?Scope Withdrawal Time: 0 hours 11 minutes 6 seconds  ?Total Procedure Duration: 0 hours 14 minutes 7 seconds  ?Findings:                 Two polyps were found in the sigmoid colon and  ?                          cecum. The polyps were 2 to 3 mm in size. These  ?                          polyps were removed with a cold snare. Resection  ?                          and retrieval were complete. ?  The exam was otherwise without abnormality on  ?                          direct and retroflexion views. ?Complications:            No immediate complications. Estimated blood loss:  ?                          None. ?Estimated Blood Loss:     Estimated blood loss: none. ?Impression:               - Two 2 to 3 mm polyps in the sigmoid colon and in  ?                          the cecum, removed with a cold snare. Resected and  ?                          retrieved. ?                          - The examination was otherwise normal on direct  ?                          and retroflexion views. ?Recommendation:           - Repeat colonoscopy in 5 years for surveillance. ?                          - Patient has a contact number available for  ?                          emergencies. The signs and symptoms of potential  ?                           delayed complications were discussed with the  ?                          patient. Return to normal activities tomorrow.  ?                          Written discharge instructions were provided to the  ?                          patient. ?                          - Resume previous diet. ?                          - Continue present medications. ?                          - Await pathology results. ?Docia Chuck. Henrene Pastor, MD ?09/01/2021 1:51:23 PM ?This report has been signed electronically. ?

## 2021-09-01 NOTE — Progress Notes (Signed)
Pt's states no medical or surgical changes since previsit or office visit. 

## 2021-09-01 NOTE — Patient Instructions (Signed)
Handout on polyps given. ? ?Await pathology results. ? ?YOU HAD AN ENDOSCOPIC PROCEDURE TODAY AT [facility]:   Refer to the procedure report that was given to you for any specific questions about what was found during the examination.  If the procedure report does not answer your questions, please call your gastroenterologist to clarify.  If you requested that your care partner not be given the details of your procedure findings, then the procedure report has been included in a sealed envelope for you to review at your convenience later. ? ?YOU SHOULD EXPECT: Some feelings of bloating in the abdomen. Passage of more gas than usual.  Walking can help get rid of the air that was put into your GI tract during the procedure and reduce the bloating. If you had a lower endoscopy (such as a colonoscopy or flexible sigmoidoscopy) you may notice spotting of blood in your stool or on the toilet paper. If you underwent a bowel prep for your procedure, you may not have a normal bowel movement for a few days. ? ?Please Note:  You might notice some irritation and congestion in your nose or some drainage.  This is from the oxygen used during your procedure.  There is no need for concern and it should clear up in a day or so. ? ?SYMPTOMS TO REPORT IMMEDIATELY: ? ?Following lower endoscopy (colonoscopy or flexible sigmoidoscopy): ? Excessive amounts of blood in the stool ? Significant tenderness or worsening of abdominal pains ? Swelling of the abdomen that is new, acute ? Fever of 100?F or higher ? ?Following upper endoscopy (EGD) ? Vomiting of blood or coffee ground material ? New chest pain or pain under the shoulder blades ? Painful or persistently difficult swallowing ? New shortness of breath ? Fever of 100?F or higher ? Black, tarry-looking stools ? ?For urgent or emergent issues, a gastroenterologist can be reached at any hour by calling [phone]. ?Do not use MyChart messaging for urgent concerns.   ? ? ?DIET:  We do recommend a small meal at first, but then you may proceed to your regular diet.  Drink plenty of fluids but you should avoid alcoholic beverages for 24 hours. ? ?ACTIVITY:  You should plan to take it easy for the rest of today and you should NOT DRIVE or use heavy machinery until tomorrow (because of the sedation medicines used during the test).   ? ?FOLLOW UP: ?Our staff will call the number listed on your records 48-72 hours following your procedure to check on you and address any questions or concerns that you may have regarding the information given to you following your procedure. If we do not reach you, we will leave a message.  We will attempt to reach you two times.  During this call, we will ask if you have developed any symptoms of COVID 19. If you develop any symptoms (ie: fever, flu-like symptoms, shortness of breath, cough etc.) before then, please call [phone].  If you test positive for Covid 19 in the 2 weeks post procedure, please call and report this information to [place].   ? ?If any biopsies were taken you will be contacted by phone or by letter within the next 1-3 weeks.  Please call us at [phone] if you have not heard about the biopsies in 3 weeks.  ? ? ?SIGNATURES/CONFIDENTIALITY: ?You and/or your care partner have signed paperwork which will be entered into your electronic medical record.  These signatures attest to the  fact that that the information above on your After Visit Summary has been reviewed and is understood.  Full responsibility of the confidentiality of this discharge information lies with you and/or your care-partner.  ?

## 2021-09-03 ENCOUNTER — Telehealth: Payer: Self-pay

## 2021-09-03 NOTE — Telephone Encounter (Signed)
?  Follow up Call- ? ? ?  09/01/2021  ? 12:50 PM  ?Call back number  ?Post procedure Call Back phone  # [phone]  ?Permission to leave phone message Yes  ?  ? ?Patient questions: ? ?Do you have a fever, pain , or abdominal swelling? No. ?Pain Score  0 * ? ?Have you tolerated food without any problems? Yes.   ? ?Have you been able to return to your normal activities? Yes.   ? ?Do you have any questions about your discharge instructions: ?Diet   No. ?Medications  No. ?Follow up visit  No. ? ?Do you have questions or concerns about your Care? No. ? ?Actions: ?* If pain score is 4 or above: ?No action needed, pain <4. ? ? ?

## 2021-09-04 ENCOUNTER — Encounter: Payer: Self-pay | Admitting: Internal Medicine

## 2021-09-08 ENCOUNTER — Other Ambulatory Visit: Payer: Self-pay | Admitting: Cardiology

## 2021-09-19 DIAGNOSIS — M542 Cervicalgia: Secondary | ICD-10-CM | POA: Diagnosis not present

## 2021-09-19 DIAGNOSIS — M5412 Radiculopathy, cervical region: Secondary | ICD-10-CM | POA: Diagnosis not present

## 2021-09-19 DIAGNOSIS — M5414 Radiculopathy, thoracic region: Secondary | ICD-10-CM | POA: Diagnosis not present

## 2021-10-06 ENCOUNTER — Ambulatory Visit (INDEPENDENT_AMBULATORY_CARE_PROVIDER_SITE_OTHER): Payer: Medicare Other | Admitting: Family

## 2021-10-06 ENCOUNTER — Encounter: Payer: Medicare Other | Admitting: Family Medicine

## 2021-10-06 VITALS — BP 113/58 | HR 73 | Temp 98.7°F | Resp 18 | Ht 68.0 in | Wt 260.0 lb

## 2021-10-06 DIAGNOSIS — B354 Tinea corporis: Secondary | ICD-10-CM

## 2021-10-06 DIAGNOSIS — E538 Deficiency of other specified B group vitamins: Secondary | ICD-10-CM

## 2021-10-06 DIAGNOSIS — M1A9XX0 Chronic gout, unspecified, without tophus (tophi): Secondary | ICD-10-CM

## 2021-10-06 DIAGNOSIS — Z91030 Bee allergy status: Secondary | ICD-10-CM

## 2021-10-06 DIAGNOSIS — I2510 Atherosclerotic heart disease of native coronary artery without angina pectoris: Secondary | ICD-10-CM

## 2021-10-06 DIAGNOSIS — G4733 Obstructive sleep apnea (adult) (pediatric): Secondary | ICD-10-CM

## 2021-10-06 DIAGNOSIS — Z9989 Dependence on other enabling machines and devices: Secondary | ICD-10-CM | POA: Diagnosis not present

## 2021-10-06 DIAGNOSIS — D500 Iron deficiency anemia secondary to blood loss (chronic): Secondary | ICD-10-CM | POA: Diagnosis not present

## 2021-10-06 DIAGNOSIS — K219 Gastro-esophageal reflux disease without esophagitis: Secondary | ICD-10-CM | POA: Diagnosis not present

## 2021-10-06 DIAGNOSIS — E559 Vitamin D deficiency, unspecified: Secondary | ICD-10-CM

## 2021-10-06 DIAGNOSIS — F5101 Primary insomnia: Secondary | ICD-10-CM | POA: Diagnosis not present

## 2021-10-06 DIAGNOSIS — E1100 Type 2 diabetes mellitus with hyperosmolarity without nonketotic hyperglycemic-hyperosmolar coma (NKHHC): Secondary | ICD-10-CM | POA: Diagnosis not present

## 2021-10-06 LAB — VITAMIN D 25 HYDROXY (VIT D DEFICIENCY, FRACTURES): VITD: 40.22 ng/mL (ref 30.00–100.00)

## 2021-10-06 LAB — COMPREHENSIVE METABOLIC PANEL
ALT: 16 U/L (ref 0–53)
AST: 24 U/L (ref 0–37)
Albumin: 4.4 g/dL (ref 3.5–5.2)
Alkaline Phosphatase: 34 U/L — ABNORMAL LOW (ref 39–117)
BUN: 25 mg/dL — ABNORMAL HIGH (ref 6–23)
CO2: 26 mEq/L (ref 19–32)
Calcium: 9.1 mg/dL (ref 8.4–10.5)
Chloride: 105 mEq/L (ref 96–112)
Creatinine, Ser: 1.15 mg/dL (ref 0.40–1.50)
GFR: 66.55 mL/min (ref >60.00)
Glucose, Bld: 163 mg/dL — ABNORMAL HIGH (ref 70–99)
Potassium: 4.5 mEq/L (ref 3.5–5.1)
Sodium: 139 mEq/L (ref 135–145)
Total Bilirubin: 0.6 mg/dL (ref 0.2–1.2)
Total Protein: 6.3 g/dL (ref 6.0–8.3)

## 2021-10-06 MED ORDER — FLUCONAZOLE 150 MG PO TABS: 150.0000 mg | ORAL_TABLET | ORAL | 0 refills | Status: AC

## 2021-10-06 MED ORDER — EPINEPHRINE 0.3 MG/0.3ML IJ SOAJ: INTRAMUSCULAR | 11 refills | Status: DC

## 2021-10-06 MED ORDER — MOMETASONE FUROATE 0.1 % EX CREA: 1.0000 | TOPICAL_CREAM | Freq: Every day | CUTANEOUS | 1 refills | Status: AC

## 2021-10-06 MED ORDER — ECONAZOLE NITRATE 1 % EX CREA: TOPICAL_CREAM | Freq: Every day | CUTANEOUS | 1 refills | Status: DC

## 2021-10-06 NOTE — Assessment & Plan Note (Signed)
Stable on HS elavil. continue same.

## 2021-10-06 NOTE — Assessment & Plan Note (Signed)
Continues sublingual b12.

## 2021-10-06 NOTE — Patient Instructions (Signed)
Start fluconazole once weekly for 4 weeks for skin rash.

## 2021-10-06 NOTE — Assessment & Plan Note (Signed)
Continues pantoprazole daily and prn pepcid. Had endoscopy with GI.

## 2021-10-06 NOTE — Progress Notes (Signed)
Subjective:   By signing my name below, I, Shehryar Baig, attest that this documentation has been prepared under the direction and in the presence of Debbrah Alar, NP 10/06/2021    Patient ID: Brent [name], male    DOB: Jul 10, 1955, 66 y.o.   MRN: [id number]  Chief Complaint  Patient presents with   Annual Exam         HPI Patient is in today for a office visit.   Spinal implant stimulator- He is having a spinal implant stimulator placed. He is requesting to see a counselor for further diagnosis and treatment of PTSD prior to his procedure.   Epi-pen- He is requesting a refill for his epi-pen to manage his bee allergy. He is planning on bee keeping.   Econazole cream- He is requesting a refill for his 1% econazole nitrate cream to manage his rash on his chest. He is requesting to see a dermatologist for further examinations since his last examination of his rash was prior to 2000.   Amitriptyline- He continues taking 50 mg amitriptyline daily PO to manage his sleep and reports doing well while taking it.   Reflux- He continues taking 40 mg pantoprazole daily PO and 40 mg Pepcid prn to manage his reflux and reports no new flare ups of reflux.   Anemia- He typically has anemia due to losing blood in his digestion. He receives iron infusions from his GI specialist depending on his iron levels.  Lab Results  Component Value Date   WBC 5.4 08/27/2021   HGB 11.4 (L) 08/27/2021   HCT 35.0 (L) 08/27/2021   MCV 90.9 08/27/2021   PLT 213 08/27/2021   Lab Results  Component Value Date   IRON 75 08/27/2021   TIBC 378 08/27/2021   FERRITIN 119 08/27/2021   Cardiologist- He continues seeing his cardiologist specialist, Dr. Marlou Porch. He reports having a stress test during his last visit and found no new issues.   Gout- He reports no new flare ups. He is managing his gout through diet.   CPAP- He continues wearing a CPAP machine regularly. He is following up with a sleep  specialist and is planning on scheduling an appointment this year.   Blood sugar- He continues taking 5 mg glyburide daily PO, and trulicity and reports doing well while taking it.  Lab Results  Component Value Date   HGBA1C 8.0 (H) 09/24/2020   Vitamin B12- He continues taking 1000 mcg cyanocobalamin supplements once every week and reports his vitamin B12 levels are good.  Lab Results  Component Value Date   VITAMINB12 506 01/20/2021   Vitamin D- He is taking 6000 units daily of Vitamin D supplements.   Health Maintenance Due  Topic Date Due   FOOT EXAM  11/15/2015   HEMOGLOBIN A1C  03/26/2021    Past Medical History:  Diagnosis Date   Acid reflux disease    ACID REFLUX DISEASE 07/05/2007   Acute gastric ulcer with bleeding    Alcohol abuse    Anemia    Atherosclerosis    Benign neoplasm of colon 07/05/2007   Bilateral hip pain 10/05/2016   Bladder cancer ([place])    Breast pain, left 11/17/2011   CAD (coronary artery disease)    Chest pain    a. Reportedly negative dobut echo performed prior to gastric bypass in 03/2011;  b. 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 12/2011 Cath: LM nl, LAD 50p, D1 76m LCX min irregs, OM3 30, RCA 25p,  41m(FFR 0.99->0.89), PDA 30, EF 65%, Med Rx.   COLONIC POLYPS, HX OF 10/29/2008   Diarrhea 06/13/2010   Diverticulosis 07/05/2018   DIVERTICULOSIS, COLON 10/29/2008   DM (diabetes mellitus), type 2, uncontrolled    GI bleed    Gout 03/04/2013   Hearing loss 05/24/2013   Previous audiology evaluation completely.   HTN (hypertension) 07/14/2010   Hx of colonic polyps    HYPERSOMNIA, ASSOCIATED WITH SLEEP APNEA 07/26/2008   Hypertension 07/14/2010   Impotence of organic origin 07/05/2007   Internal hemorrhoids    Knee pain, left 10/10/2010   Morbid obesity ([place] 03/27/2010   a. 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   Neck pain 03/2015   Other and unspecified hyperlipidemia 11/16/2012   Post-operative nausea and vomiting    after the surgery in  hospital in March 2020/ past the cauderization   Preventative health care 11/17/2011   Renal stone 11/2013   Sleep apnea    a. CPAP   Spinal stenosis    Tear of meniscus of left knee 2012    Past Surgical History:  Procedure Laterality Date   ANKLE SURGERY Right 1994   BICEPS TENDON REPAIR     left side   CARDIAC CATHETERIZATION     denies any chest pain in the past 2 years   COLONOSCOPY     colonoscopy polyps     ESOPHAGOGASTRODUODENOSCOPY N/A 03/27/2013   Procedure: ESOPHAGOGASTRODUODENOSCOPY (EGD);  Surgeon: JIrene Shipper MD;  [facility];  Service: Endoscopy;  Laterality: N/A;   ESOPHAGOGASTRODUODENOSCOPY (EGD) WITH PROPOFOL N/A 07/06/2018   Procedure: ESOPHAGOGASTRODUODENOSCOPY (EGD) WITH PROPOFOL;  Surgeon: PJerene Bears MD;  [facility];  Service: Gastroenterology;  Laterality: N/A;   GASTRIC BYPASS     HERNIA REPAIR     HOT HEMOSTASIS N/A 07/06/2018   Procedure: HOT HEMOSTASIS (ARGON PLASMA COAGULATION/BICAP);  Surgeon: PJerene Bears MD;  [facility];  Service: Gastroenterology;  Laterality: N/A;   KNEE ARTHROSCOPY Left 11/06/2010   Left, torn meniscus (repaired)   LEFT HEART CATHETERIZATION WITH CORONARY ANGIOGRAM N/A 12/23/2011   Procedure: LEFT HEART CATHETERIZATION WITH CORONARY ANGIOGRAM;  Surgeon: JMinus Breeding MD;  [facility];  Service: Cardiovascular;  Laterality: N/A;   REPLACEMENT TOTAL KNEE Right 01/2016   removed scar tissue/ cut tip of nerve bundle and re-route   right knee arthroscopy Right 07/05/14   Dr. AHart Robinsons GMinier   ROTATOR CUFF REPAIR  2019   left   SCHLEROTHERAPY  07/06/2018   Procedure: SCHLEROTHERAPY;  Surgeon: PJerene Bears MD;  [facility];  Service: Gastroenterology;;   TONSILLECTOMY  age 66  TONSILLECTOMY     as a child   TOTAL KNEE ARTHROPLASTY Right 01/20/2016   Procedure: RIGHT TOTAL KNEE ARTHROPLASTY;  Surgeon: MParalee Cancel MD;  [facility];  Service: Orthopedics;   Laterality: Right;   TRANSURETHRAL RESECTION OF BLADDER TUMOR N/A 09/24/2020   Procedure: TRANSURETHRAL RESECTION OF BLADDER TUMOR (TURBT)  RIGHT retrograde pylegram;  Surgeon: EFestus Aloe MD;  Location: [facility];  Service: Urology;  Laterality: N/A;   UPPER GI ENDOSCOPY  03/27/13    Family History  Problem Relation Age of Onset   Diabetes Mother    Hypertension Mother    Stroke Mother    Hyperlipidemia Mother    Hypertension Father    Colon polyps Father    Heart attack Father 649  Stroke Father    Heart attack Brother    Diabetes Brother  Heart disease Brother    Heart attack Brother        Multiple   Diabetes Brother    Diabetes Sister    Stroke Sister    Obesity Brother    Diabetes Brother    Heart disease Brother    Hypertension Maternal Grandmother    ADD / ADHD Daughter    Heart disease Brother    Stomach cancer Neg Hx    Colon cancer Neg Hx    Esophageal cancer Neg Hx    Rectal cancer Neg Hx     Social History   Socioeconomic History   Marital status: Married    Spouse name: Not on file   Number of children: 2   Years of education: Not on file   Highest education level: Not on file  Occupational History   Occupation: TEACHER    Employer: [employer]  Tobacco Use   Smoking status: Former    Packs/day: 1.50    Years: 20.00    Total pack years: 30.00    Types: Cigarettes    Quit date: 04/21/1991    Years since quitting: 30.4   Smokeless tobacco: Never  Vaping Use   Vaping Use: Never used  Substance and Sexual Activity   Alcohol use: Yes    Alcohol/week: 3.0 standard drinks of alcohol    Types: 3 Cans of beer per week    Comment: weekly   Drug use: No   Sexual activity: Not on file  Other Topics Concern   Not on file  Social History Narrative   Lives with wife in [place].  Does not routinely exercise.   Social Determinants of Health   Financial Resource Strain: Not on file  Food Insecurity: Not on file  Transportation Needs:  Not on file  Physical Activity: Not on file  Stress: Not on file  Social Connections: Not on file  Intimate Partner Violence: Not on file    Outpatient Medications Prior to Visit  Medication Sig Dispense Refill   acetaminophen (TYLENOL) 500 MG tablet Take 1,000 mg by mouth every 6 (six) hours as needed for moderate pain or headache.     amitriptyline (ELAVIL) 50 MG tablet TAKE 1 TABLET AT BEDTIME 90 tablet 3   amoxicillin (AMOXIL) 500 MG capsule TAKE 4 CAPSULES BY MOUTH PRIOR TO APPOINTMENT (Patient not taking: Reported on 09/01/2021)     Cholecalciferol (DIALYVITE VITAMIN D 5000) 125 MCG (5000 UT) capsule Take 5,000 Units by mouth daily.     Cyanocobalamin (VITAMIN B-12) 1000 MCG SUBL Place 1,000 mcg under the tongue once a week.     Dulaglutide (TRULICITY) 1.5 LZ/7.6BH SOPN Trulicity 1.5 AL/9.3 mL subcutaneous pen injector     famotidine (PEPCID) 40 MG tablet TAKE 1 TABLET AT BEDTIME AS NEEDED FOR HEARTBURN OR INDIGESTION (Patient not taking: Reported on 09/01/2021) 90 tablet 1   fenofibrate 160 MG tablet TAKE 1 TABLET DAILY 90 tablet 3   Ferrous Gluconate-C-Folic Acid (IRON-C PO) Take 1 tablet by mouth daily. Iron 65 mg and vit C-125 mg     fluticasone (FLONASE) 50 MCG/ACT nasal spray Place 2 sprays into both nostrils daily. 48 g 1   gabapentin (NEURONTIN) 300 MG capsule TAKE 1 CAPSULE TWICE A DAY AND 2 CAPSULES AT BEDTIME 360 capsule 3   glucose blood (FREESTYLE LITE) test strip Use to check blood sugar 4 times day.  Dx Code: E11.9 (Patient not taking: Reported on 09/01/2021) 400 each 1   glyBURIDE (DIABETA) 5 MG tablet  Take 1 tablet (5 mg total) by mouth daily. (Patient taking differently: Take 2.5 mg by mouth daily.) 90 tablet 1   insulin lispro (HUMALOG) 100 UNIT/ML KwikPen Inject 2-6 Units into the skin 3 (three) times daily. Per sliding scale. (Patient not taking: Reported on 09/01/2021) 15 mL 0   Insulin Pen Needle (PEN NEEDLES 31GX5/16") 31G X 8 MM MISC Use as directed with Humalog 3  times a day and at bedtime (Patient not taking: Reported on 09/01/2021) 100 each 0   LIVALO 2 MG TABS TAKE 1 TABLET DAILY 90 tablet 3   losartan (COZAAR) 50 MG tablet TAKE 1 TABLET DAILY (KEEP UPCOMING APPOINTMENT IN MARCH 2023 WITH CARDIOLOGIST BEFORE ANYMORE REFILLS) 90 tablet 3   Magnesium 100 MG CAPS Take 1 capsule (100 mg total) by mouth daily. (Patient not taking: Reported on 09/01/2021) 30 capsule 1   metoprolol tartrate (LOPRESSOR) 100 MG tablet Take 1 tablet (100 mg total) by mouth 2 (two) times daily. 180 tablet 3   Multiple Vitamin (MULTIVITAMIN) tablet Take 1 tablet by mouth daily. BARIATRIC VIT     oxycodone (OXY-IR) 5 MG capsule Take 1 mg by mouth 3 (three) times daily. PRN     pantoprazole (PROTONIX) 40 MG tablet TAKE 1 TABLET TWICE A DAY BEFORE MEALS 180 tablet 3   sodium chloride (OCEAN) 0.65 % SOLN nasal spray Place 1 spray into both nostrils as needed for congestion (nose irritation). (Patient not taking: Reported on 09/01/2021) 30 mL 0   cetirizine (ZYRTEC) 10 MG tablet Take 10 mg by mouth at bedtime. (Patient not taking: Reported on 09/01/2021)     econazole nitrate 1 % cream Apply topically daily. (Patient not taking: Reported on 09/01/2021) 30 g 1   EPINEPHRINE 0.3 mg/0.3 mL IJ SOAJ injection INJECT 0.3 MLS (0.3 MG TOTAL) INTO THE MUSCLE ONCE FOR 1 DOSE (Patient not taking: Reported on 09/01/2021) 2 each 11   mometasone (ELOCON) 0.1 % cream Apply 1 application topically daily. (Patient taking differently: Apply 1 application. topically daily as needed (poison ivy).) 90 g 1   No facility-administered medications prior to visit.    Allergies  Allergen Reactions   Bee Venom Anaphylaxis   Lipitor [Atorvastatin] Other (See Comments)    Myalgias, memory changes   Morphine Other (See Comments)    hyperactive   Metformin And Related Other (See Comments)    Myalgias and weakness   Robaxin [Methocarbamol]     GI bleed    Baclofen     Acted drunk   Medrol [Methylprednisolone]      Hyperglycemia    Simvastatin     Joint pain   Hydrocodone Itching   Pravastatin Other (See Comments)    Made joints    ROS    See HPI Objective:    Physical Exam Constitutional:      General: He is not in acute distress.    Appearance: Normal appearance. He is not ill-appearing.  HENT:     Head: Normocephalic and atraumatic.     Right Ear: External ear normal.     Left Ear: External ear normal.  Eyes:     Extraocular Movements: Extraocular movements intact.     Pupils: Pupils are equal, round, and reactive to light.  Cardiovascular:     Rate and Rhythm: Normal rate and regular rhythm.     Heart sounds: Normal heart sounds. No murmur heard.    No gallop.  Pulmonary:     Effort: Pulmonary effort is normal. No respiratory  distress.     Breath sounds: Normal breath sounds. No wheezing or rales.  Skin:    General: Skin is warm and dry.     Findings: Rash present. Rash is macular (hyperpigmented rash on chest).  Neurological:     Mental Status: He is alert and oriented to person, place, and time.  Psychiatric:        Judgment: Judgment normal.     BP (!) 113/58 (BP Location: Right Arm, Patient Position: Sitting, Cuff Size: Small)   Pulse 73   Temp 98.7 F (37.1 C) (Oral)   Resp 18   Ht '5\' 8"'  (1.727 m)   Wt 260 lb (117.9 kg)   SpO2 97%   BMI 39.53 kg/m  Wt Readings from Last 3 Encounters:  10/06/21 260 lb (117.9 kg)  09/01/21 263 lb (119.3 kg)  08/27/21 260 lb (117.9 kg)       Assessment & Plan:   Problem List Items Addressed This Visit       Unprioritized   Vitamin D deficiency   Relevant Orders   VITAMIN D 25 Hydroxy (Vit-D Deficiency, Fractures)   Vitamin B12 deficiency    Continues sublingual b12.        Tinea corporis - Primary   Relevant Medications   econazole nitrate 1 % cream   mometasone (ELOCON) 0.1 % cream   fluconazole (DIFLUCAN) 150 MG tablet   Other Relevant Orders   Ambulatory referral to Dermatology   T2DM (type 2 diabetes  mellitus) ([place])    Lab Results  Component Value Date   HGBA1C 8.0 (H) 09/24/2020   HGBA1C 7.7 (H) 01/11/2020   HGBA1C 6.3 05/03/2019   Lab Results  Component Value Date   MICROALBUR 1.1 12/02/2015   LDLCALC 45 01/20/2021   CREATININE 1.23 05/22/2021  Reports that trulicity and glyburide have gotten his sugars to goal with endocrinology       Relevant Orders   Comp Met (CMET)   Obstructive sleep apnea on CPAP    Followed by sleep specialist, plans to make a follow up appointment with them. Continues cpap.      Insomnia    Stable on HS elavil. continue same.       Gout    Stable with dietary modification. Monitor.       Gastroesophageal reflux disease    Continues pantoprazole daily and prn pepcid. Had endoscopy with GI.       CAD (coronary artery disease)    Followed by Dr. Marlou Porch.  On livalo/fenofibrate.       Relevant Medications   EPINEPHrine 0.3 mg/0.3 mL IJ SOAJ injection   Anemia due to chronic blood loss    Lab Results  Component Value Date   WBC 5.4 08/27/2021   HGB 11.4 (L) 08/27/2021   HCT 35.0 (L) 08/27/2021   MCV 90.9 08/27/2021   PLT 213 08/27/2021        Other Visit Diagnoses     Bee allergy status       Relevant Medications   EPINEPHrine 0.3 mg/0.3 mL IJ SOAJ injection      Handicapped placard form filled.  Meds ordered this encounter  Medications   econazole nitrate 1 % cream    Sig: Apply topically daily.    Dispense:  30 g    Refill:  1   mometasone (ELOCON) 0.1 % cream    Sig: Apply 1 Application topically daily.    Dispense:  90 g    Refill:  1  EPINEPHrine 0.3 mg/0.3 mL IJ SOAJ injection    Sig: INJECT 0.3 MLS (0.3 MG TOTAL) INTO THE MUSCLE ONCE FOR 1 DOSE    Dispense:  2 each    Refill:  11    Order Specific Question:   Supervising Provider    Answer:   [name] A [[id number]]   fluconazole (DIFLUCAN) 150 MG tablet    Sig: Take 1 tablet (150 mg total) by mouth once a week for 4 doses.    Dispense:  4 tablet     Refill:  0    Order Specific Question:   Supervising Provider    Answer:   [name] A [[id number]]    I, [name], NP, personally preformed the services described in this documentation.  All medical record entries made by the scribe were at my direction and in my presence.  I have reviewed the chart and discharge instructions (if applicable) and agree that the record reflects my personal performance and is accurate and complete. 10/06/2021   I,Shehryar Baig,acting as a Education administrator for [name], NP.,have documented all relevant documentation on the behalf of [name], NP,as directed by  [name], NP while in the presence of [name], NP.   [name], NP

## 2021-10-06 NOTE — Assessment & Plan Note (Signed)
Lab Results  Component Value Date   WBC 5.4 08/27/2021   HGB 11.4 (L) 08/27/2021   HCT 35.0 (L) 08/27/2021   MCV 90.9 08/27/2021   PLT 213 08/27/2021

## 2021-10-06 NOTE — Assessment & Plan Note (Signed)
Lab Results  Component Value Date   HGBA1C 8.0 (H) 09/24/2020   HGBA1C 7.7 (H) 01/11/2020   HGBA1C 6.3 05/03/2019   Lab Results  Component Value Date   MICROALBUR 1.1 12/02/2015   LDLCALC 45 01/20/2021   CREATININE 1.23 05/22/2021   Reports that trulicity and glyburide have gotten his sugars to goal with endocrinology

## 2021-10-06 NOTE — Assessment & Plan Note (Signed)
Followed by Dr. Marlou Porch.  On livalo/fenofibrate.

## 2021-10-06 NOTE — Assessment & Plan Note (Signed)
Followed by sleep specialist, plans to make a follow up appointment with them. Continues cpap.

## 2021-10-06 NOTE — Assessment & Plan Note (Signed)
Stable with dietary modification. Monitor.

## 2021-10-08 ENCOUNTER — Other Ambulatory Visit: Payer: Self-pay | Admitting: Family Medicine

## 2021-10-09 ENCOUNTER — Other Ambulatory Visit: Payer: Self-pay | Admitting: Family Medicine

## 2021-10-13 DIAGNOSIS — E1139 Type 2 diabetes mellitus with other diabetic ophthalmic complication: Secondary | ICD-10-CM | POA: Diagnosis not present

## 2021-10-13 DIAGNOSIS — H2513 Age-related nuclear cataract, bilateral: Secondary | ICD-10-CM | POA: Diagnosis not present

## 2021-10-13 DIAGNOSIS — Z7984 Long term (current) use of oral hypoglycemic drugs: Secondary | ICD-10-CM | POA: Diagnosis not present

## 2021-10-13 LAB — HM DIABETES EYE EXAM

## 2021-10-14 DIAGNOSIS — G4730 Sleep apnea, unspecified: Secondary | ICD-10-CM | POA: Diagnosis not present

## 2021-10-14 DIAGNOSIS — G4733 Obstructive sleep apnea (adult) (pediatric): Secondary | ICD-10-CM | POA: Diagnosis not present

## 2021-10-15 DIAGNOSIS — M5412 Radiculopathy, cervical region: Secondary | ICD-10-CM | POA: Diagnosis not present

## 2021-10-15 DIAGNOSIS — M5030 Other cervical disc degeneration, unspecified cervical region: Secondary | ICD-10-CM | POA: Diagnosis not present

## 2021-10-15 DIAGNOSIS — M5414 Radiculopathy, thoracic region: Secondary | ICD-10-CM | POA: Diagnosis not present

## 2021-10-15 DIAGNOSIS — M5413 Radiculopathy, cervicothoracic region: Secondary | ICD-10-CM | POA: Diagnosis not present

## 2021-10-15 DIAGNOSIS — M542 Cervicalgia: Secondary | ICD-10-CM | POA: Diagnosis not present

## 2021-10-25 ENCOUNTER — Other Ambulatory Visit: Payer: Self-pay | Admitting: Family

## 2021-10-25 ENCOUNTER — Telehealth: Payer: Self-pay | Admitting: Family

## 2021-10-25 DIAGNOSIS — B354 Tinea corporis: Secondary | ICD-10-CM

## 2021-10-25 NOTE — Telephone Encounter (Signed)
See mychart.  

## 2021-10-31 ENCOUNTER — Encounter: Payer: Self-pay | Admitting: Family

## 2021-11-12 ENCOUNTER — Encounter: Payer: Self-pay | Admitting: Family

## 2021-11-13 ENCOUNTER — Telehealth: Payer: Self-pay | Admitting: *Deleted

## 2021-11-13 ENCOUNTER — Other Ambulatory Visit: Payer: Self-pay | Admitting: *Deleted

## 2021-11-13 DIAGNOSIS — D500 Iron deficiency anemia secondary to blood loss (chronic): Secondary | ICD-10-CM

## 2021-11-13 DIAGNOSIS — K909 Intestinal malabsorption, unspecified: Secondary | ICD-10-CM

## 2021-11-13 DIAGNOSIS — M5136 Other intervertebral disc degeneration, lumbar region: Secondary | ICD-10-CM | POA: Diagnosis not present

## 2021-11-13 DIAGNOSIS — M5030 Other cervical disc degeneration, unspecified cervical region: Secondary | ICD-10-CM | POA: Diagnosis not present

## 2021-11-13 DIAGNOSIS — M5412 Radiculopathy, cervical region: Secondary | ICD-10-CM | POA: Diagnosis not present

## 2021-11-13 NOTE — Telephone Encounter (Signed)
Per scheduling message Lorriane Shire - labs only - scheduled & confirmed

## 2021-11-14 ENCOUNTER — Inpatient Hospital Stay: Payer: Medicare Other

## 2021-11-18 ENCOUNTER — Inpatient Hospital Stay: Payer: Medicare Other | Attending: Hematology & Oncology

## 2021-11-18 DIAGNOSIS — D500 Iron deficiency anemia secondary to blood loss (chronic): Secondary | ICD-10-CM

## 2021-11-18 DIAGNOSIS — K909 Intestinal malabsorption, unspecified: Secondary | ICD-10-CM

## 2021-11-18 DIAGNOSIS — D509 Iron deficiency anemia, unspecified: Secondary | ICD-10-CM | POA: Diagnosis not present

## 2021-11-18 LAB — CBC WITH DIFFERENTIAL (CANCER CENTER ONLY)
Abs Immature Granulocytes: 0.03 10*3/uL (ref 0.00–0.07)
Basophils Absolute: 0 10*3/uL (ref 0.0–0.1)
Basophils Relative: 0 %
Eosinophils Absolute: 0.2 10*3/uL (ref 0.0–0.5)
Eosinophils Relative: 3 %
HCT: 37.4 % — ABNORMAL LOW (ref 39.0–52.0)
Hemoglobin: 12.3 g/dL — ABNORMAL LOW (ref 13.0–17.0)
Immature Granulocytes: 1 %
Lymphocytes Relative: 25 %
Lymphs Abs: 1.2 10*3/uL (ref 0.7–4.0)
MCH: 29.6 pg (ref 26.0–34.0)
MCHC: 32.9 g/dL (ref 30.0–36.0)
MCV: 90.1 fL (ref 80.0–100.0)
Monocytes Absolute: 0.4 10*3/uL (ref 0.1–1.0)
Monocytes Relative: 10 %
Neutro Abs: 2.8 10*3/uL (ref 1.7–7.7)
Neutrophils Relative %: 61 %
Platelet Count: 196 10*3/uL (ref 150–400)
RBC: 4.15 MIL/uL — ABNORMAL LOW (ref 4.22–5.81)
RDW: 12.9 % (ref 11.5–15.5)
WBC Count: 4.7 10*3/uL (ref 4.0–10.5)
nRBC: 0 % (ref 0.0–0.2)

## 2021-11-18 LAB — IRON AND IRON BINDING CAPACITY (CC-WL,HP ONLY)
Iron: 104 ug/dL (ref 45–182)
Saturation Ratios: 27 % (ref 17.9–39.5)
TIBC: 386 ug/dL (ref 250–450)
UIBC: 282 ug/dL (ref 117–376)

## 2021-11-18 LAB — CMP (CANCER CENTER ONLY)
ALT: 15 U/L (ref 0–44)
AST: 21 U/L (ref 15–41)
Albumin: 4.7 g/dL (ref 3.5–5.0)
Alkaline Phosphatase: 31 U/L — ABNORMAL LOW (ref 38–126)
Anion gap: 9 (ref 5–15)
BUN: 23 mg/dL (ref 8–23)
CO2: 26 mmol/L (ref 22–32)
Calcium: 9.5 mg/dL (ref 8.9–10.3)
Chloride: 106 mmol/L (ref 98–111)
Creatinine: 1.19 mg/dL (ref 0.61–1.24)
GFR, Estimated: >60 mL/min (ref >60)
Glucose, Bld: 146 mg/dL — ABNORMAL HIGH (ref 70–99)
Potassium: 4.7 mmol/L (ref 3.5–5.1)
Sodium: 141 mmol/L (ref 135–145)
Total Bilirubin: 0.7 mg/dL (ref 0.3–1.2)
Total Protein: 6.4 g/dL — ABNORMAL LOW (ref 6.5–8.1)

## 2021-11-18 LAB — SAMPLE TO BLOOD BANK

## 2021-11-18 LAB — FERRITIN: Ferritin: 168 ng/mL (ref 24–336)

## 2021-11-18 LAB — RETICULOCYTES
Immature Retic Fract: 10.1 % (ref 2.3–15.9)
RBC.: 4.19 MIL/uL — ABNORMAL LOW (ref 4.22–5.81)
Retic Count, Absolute: 97.6 10*3/uL (ref 19.0–186.0)
Retic Ct Pct: 2.3 % (ref 0.4–3.1)

## 2021-11-19 ENCOUNTER — Encounter: Payer: Self-pay | Admitting: Family Medicine

## 2021-11-20 DIAGNOSIS — G894 Chronic pain syndrome: Secondary | ICD-10-CM | POA: Diagnosis not present

## 2021-11-20 DIAGNOSIS — Z9689 Presence of other specified functional implants: Secondary | ICD-10-CM | POA: Diagnosis not present

## 2021-11-22 ENCOUNTER — Encounter: Payer: Self-pay | Admitting: Family Medicine

## 2021-11-23 ENCOUNTER — Encounter: Payer: Self-pay | Admitting: Cardiology

## 2021-11-24 ENCOUNTER — Encounter: Payer: Self-pay | Admitting: Physician Assistant

## 2021-11-24 ENCOUNTER — Other Ambulatory Visit: Payer: Self-pay | Admitting: Family Medicine

## 2021-11-24 ENCOUNTER — Ambulatory Visit (INDEPENDENT_AMBULATORY_CARE_PROVIDER_SITE_OTHER): Payer: Medicare Other | Admitting: Nurse Practitioner

## 2021-11-24 VITALS — BP 120/82 | HR 80 | Ht 68.0 in | Wt 257.0 lb

## 2021-11-24 DIAGNOSIS — R5383 Other fatigue: Secondary | ICD-10-CM | POA: Diagnosis not present

## 2021-11-24 DIAGNOSIS — R0989 Other specified symptoms and signs involving the circulatory and respiratory systems: Secondary | ICD-10-CM | POA: Diagnosis not present

## 2021-11-24 DIAGNOSIS — F1090 Alcohol use, unspecified, uncomplicated: Secondary | ICD-10-CM

## 2021-11-24 DIAGNOSIS — I471 Supraventricular tachycardia: Secondary | ICD-10-CM | POA: Diagnosis not present

## 2021-11-24 DIAGNOSIS — R0609 Other forms of dyspnea: Secondary | ICD-10-CM

## 2021-11-24 DIAGNOSIS — I10 Essential (primary) hypertension: Secondary | ICD-10-CM | POA: Diagnosis not present

## 2021-11-24 MED ORDER — HYDROCHLOROTHIAZIDE 12.5 MG PO CAPS: 12.5000 mg | ORAL_CAPSULE | Freq: Every day | ORAL | 1 refills | Status: DC

## 2021-11-24 MED ORDER — LIVALO 2 MG PO TABS: 1.0000 | ORAL_TABLET | Freq: Every day | ORAL | 3 refills | Status: DC

## 2021-11-24 MED ORDER — ASPIRIN 81 MG PO TBEC: 81.0000 mg | DELAYED_RELEASE_TABLET | Freq: Every day | ORAL | 3 refills | Status: DC

## 2021-11-24 MED ORDER — SODIUM CHLORIDE 0.9% FLUSH: 3.0000 mL | Freq: Two times a day (BID) | INTRAVENOUS | Status: DC

## 2021-11-24 NOTE — Telephone Encounter (Signed)
Pt c/o BP issue: STAT if pt c/o blurred vision, one-sided weakness or slurred speech  1. What are your last 5 BP readings?  08/05-06/23 180/110  HR has gotten up to 100  2. Are you having any other symptoms (ex. Dizziness, headache, blurred vision, passed out)? Sob, sweaty/clammy, pain in arm, & extreme fatigue  3. What is your BP issue? BP has gotten up to 180/110 over the weekend with SOB and extreme fatigue occurring with activity. Wife states this has been off and on, but worsened over the weekend. She states the patient has also been complaining of arm pain, but it does not radiate. He recently had something placed in his arm that they believe could be the cause of his arm pain. Patient's wife is a Therapist, sports and states she does not feel his symptoms are severe enough to take him to the emergency room, but she is requesting an appointment for him to be seen today. Patient is fine now, resting wife has not gotten him up. Please advise.

## 2021-11-24 NOTE — Telephone Encounter (Signed)
Called pt spouse Chrisandra Netters per [place].   Reports pt BP 180's/100's.  Spouse reports when she called in this morning was scheduled to see Melina Copa, PA at 10:05 am.  No further needs at this time.

## 2021-11-24 NOTE — Progress Notes (Signed)
Cardiology Office Note:    Date:  11/24/2021   ID:  Brent [name], DOB Oct 09, 1955, MRN [id number]  PCP:  [name], MD   [facility] Cardiologist:  Candee Furbish, MD     Referring MD: [name], MD   CC: Extreme fatigue worsening X 1 month, DOE, and labile hypertension  History of Present Illness:    Brent [name] is a 66 y.o. male with a hx of the following:   HTN CAD OSA on CPAP Diverticulosis GERD T2DM Morbid obesity HLD Gout Hx of gastric bypass sx 2012 Hx of colonic polyps Anemia Hematochezia GIB 2014, 2020 (latter with + Gastric Ulcer, Jejunal Marginal Ulcer -> tx with epi/coag IDA Perianal fistula  Previous cardiac work-up includes cardiac catheterization on December 23, 2011 with 70% mid RCA that was managed medically following coronary CT that revealed a calcium score greater 1000.  He has had a history of difficult to control diabetes, hypertension and frequent palpitations. Prior monitor in 06/2019 showed rare PVCs and rare episodes of PAT (no afib). His hyperlipidemia is being managed on Livalo.  He was last seen in the cardiology office by Christen Bame, NP on July 15, 2021.  His activity was limited due to orthopedic pain in his bilateral knees and back.  This had limited his walking.  Denied any chest pain, orthopnea, PND, lightheadedness, presyncope, syncope, or dizziness.  Stated occasional palpitations and lower extremity edema.  Palpitations did not interfere with his daily activities.  Could not determine if his shortness of breath was worse than in the past.  Home blood pressure readings were stable.  No other cardiac complaints at the time.  Lexiscan arranged for him on July 25, 2021 was normal, LVEF is normal, nuclear stress EF: 62%.   Notified our office on November 23, 2021 reporting shortness of breath with exertion and extreme fatigue, worsening over the last month. Associated symptoms recently include diaphoresis and  pain in his left arm. Blood pressure elevated to 180/100 over the weekend, HR 103.   Pain in arm does not radiate, thinks this is due to his stimulator and has turned it off this past weekend.  Wife also thinks it might be due to the dogs jerking while walking them.  Patient's wife is an Therapist, sports and requested that patient be seen in the office today.  Blood pressures have varied over the weekend from 127/75 to 195/100.  This morning his blood pressure was 182/103.  Apple Watch recording he sent to [place] via MyChart appears to be sinus tachycardia, heart rate 103.  Denies any chest pain, palpitations, syncope, presyncope, dizziness, vertigo, orthopnea, PND, swelling, bleeding, or claudication.  Does have some mild lightheaded feeling with bending over, and states he lost 8 pounds in the past 24 hours on his home scale.  Denies any bleeding in his stool or urine. Denies any hemoptysis.  Denies any other question or concern at this time. Feels well today.  Past Medical History:  Diagnosis Date   Acid reflux disease    ACID REFLUX DISEASE 07/05/2007   Acute gastric ulcer with bleeding    Alcohol abuse    Anemia    Atherosclerosis    Benign neoplasm of colon 07/05/2007   Bilateral hip pain 10/05/2016   Bladder cancer ([place])    Breast pain, left 11/17/2011   CAD (coronary artery disease)    Chest pain    a. Reportedly negative dobut echo performed prior to gastric bypass in  03/2011;  b. 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 12/2011 Cath: LM nl, LAD 50p, D1 46m LCX min irregs, OM3 30, RCA 25p, 748mFFR 0.99->0.89), PDA 30, EF 65%, Med Rx.   COLONIC POLYPS, HX OF 10/29/2008   Diarrhea 06/13/2010   Diverticulosis 07/05/2018   DIVERTICULOSIS, COLON 10/29/2008   DM (diabetes mellitus), type 2, uncontrolled    GI bleed    Gout 03/04/2013   Hearing loss 05/24/2013   Previous audiology evaluation completely.   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   Neck pain 03/2015   Other and unspecified hyperlipidemia 11/16/2012   Post-operative nausea and vomiting    after the surgery in hospital in March 2020/ past the cauderization   Preventative health care 11/17/2011   Renal stone 11/2013   Sleep apnea    a. CPAP   Spinal stenosis    Tear of meniscus of left knee 2012    Past Surgical History:  Procedure Laterality Date   ANKLE SURGERY Right 1994   BICEPS TENDON REPAIR     left side   CARDIAC CATHETERIZATION     denies any chest pain in the past 2 years   COLONOSCOPY     colonoscopy polyps     ESOPHAGOGASTRODUODENOSCOPY N/A 03/27/2013   Procedure: ESOPHAGOGASTRODUODENOSCOPY (EGD);  Surgeon: JoIrene ShipperMD;  [facility];  Service: Endoscopy;  Laterality: N/A;   ESOPHAGOGASTRODUODENOSCOPY (EGD) WITH PROPOFOL N/A 07/06/2018   Procedure: ESOPHAGOGASTRODUODENOSCOPY (EGD) WITH PROPOFOL;  Surgeon: PyJerene BearsMD;  [facility];  Service: Gastroenterology;  Laterality: N/A;   GASTRIC BYPASS     HERNIA REPAIR     HOT HEMOSTASIS N/A 07/06/2018   Procedure: HOT HEMOSTASIS (ARGON PLASMA COAGULATION/BICAP);  Surgeon: PyJerene BearsMD;  [facility];  Service: Gastroenterology;  Laterality: N/A;   KNEE ARTHROSCOPY Left 11/06/2010   Left, torn meniscus (repaired)   LEFT HEART CATHETERIZATION WITH CORONARY ANGIOGRAM N/A 12/23/2011   Procedure: LEFT HEART CATHETERIZATION WITH CORONARY ANGIOGRAM;  Surgeon: JaMinus BreedingMD;  [facility];  Service: Cardiovascular;  Laterality: N/A;   REPLACEMENT TOTAL KNEE Right 01/2016   removed scar tissue/ cut tip of nerve bundle and re-route   right knee arthroscopy Right 07/05/14   Dr. AnHart RobinsonsGSOswego  ROTATOR CUFF REPAIR  2019   left   SCHLEROTHERAPY   07/06/2018   Procedure: SCHLEROTHERAPY;  Surgeon: PyJerene BearsMD;  [facility];  Service: Gastroenterology;;   TONSILLECTOMY  age [age] TONSILLECTOMY     as a child   TOTAL KNEE ARTHROPLASTY Right 01/20/2016   Procedure: RIGHT TOTAL KNEE ARTHROPLASTY;  Surgeon: MaParalee CancelMD;  Location[facility];  Service: Orthopedics;  Laterality: Right;   TRANSURETHRAL RESECTION OF BLADDER TUMOR N/A 09/24/2020   Procedure: TRANSURETHRAL RESECTION OF BLADDER TUMOR (TURBT)  RIGHT retrograde pylegram;  Surgeon: EsFestus AloeMD;  Location[facility];  Service: Urology;  Laterality: N/A;   UPPER GI ENDOSCOPY  03/27/13    Current Medications: Current Meds  Medication Sig   acetaminophen (TYLENOL) 500 MG tablet Take 1,000 mg by mouth every 6 (six) hours as needed for moderate pain or headache.   amitriptyline (ELAVIL) 50  MG tablet TAKE 1 TABLET AT BEDTIME   amoxicillin (AMOXIL) 500 MG capsule TAKE 4 CAPSULES BY MOUTH PRIOR TO APPOINTMENT   Cholecalciferol (DIALYVITE VITAMIN D 5000) 125 MCG (5000 UT) capsule Take 5,000 Units by mouth daily.   Cyanocobalamin (VITAMIN B-12) 1000 MCG SUBL Place 1,000 mcg under the tongue once a week.   Dulaglutide (TRULICITY) 1.5 WL/2.9VF SOPN Trulicity 1.5 MB/3.4 mL subcutaneous pen injector   econazole nitrate 1 % cream Apply topically daily.   EPINEPHrine 0.3 mg/0.3 mL IJ SOAJ injection INJECT 0.3 MLS (0.3 MG TOTAL) INTO THE MUSCLE ONCE FOR 1 DOSE   famotidine (PEPCID) 40 MG tablet TAKE 1 TABLET AT BEDTIME AS NEEDED FOR HEARTBURN OR INDIGESTION   fenofibrate 160 MG tablet TAKE 1 TABLET DAILY   Ferrous Gluconate-C-Folic Acid (IRON-C PO) Take 1 tablet by mouth daily. Iron 65 mg and vit C-125 mg   fluticasone (FLONASE) 50 MCG/ACT nasal spray Place 2 sprays into both nostrils daily.   gabapentin (NEURONTIN) 300 MG capsule TAKE 1 CAPSULE TWICE A DAY AND 2 CAPSULES AT BEDTIME   glucose blood (FREESTYLE LITE) test strip Use to check blood sugar 4 times day.  Dx Code: E11.9    glyBURIDE (DIABETA) 5 MG tablet Take 1 tablet (5 mg total) by mouth daily. (Patient taking differently: Take 2.5 mg by mouth daily.)   hydrochlorothiazide (MICROZIDE) 12.5 MG capsule Take 1 capsule (12.5 mg total) by mouth daily as needed.   insulin lispro (HUMALOG) 100 UNIT/ML KwikPen Inject 2-6 Units into the skin 3 (three) times daily. Per sliding scale.   Insulin Pen Needle (PEN NEEDLES 31GX5/16") 31G X 8 MM MISC Use as directed with Humalog 3 times a day and at bedtime   losartan (COZAAR) 50 MG tablet TAKE 1 TABLET DAILY (KEEP UPCOMING APPOINTMENT IN [facility] 2023 WITH CARDIOLOGIST BEFORE ANYMORE REFILLS)   Magnesium 100 MG CAPS Take 1 capsule (100 mg total) by mouth daily.   metoprolol tartrate (LOPRESSOR) 100 MG tablet Take 1 tablet (100 mg total) by mouth 2 (two) times daily.   mometasone (ELOCON) 0.1 % cream Apply 1 Application topically daily.   Multiple Vitamin (MULTIVITAMIN) tablet Take 1 tablet by mouth daily. BARIATRIC VIT   oxycodone (OXY-IR) 5 MG capsule Take 1 mg by mouth 3 (three) times daily. PRN   pantoprazole (PROTONIX) 40 MG tablet TAKE 1 TABLET TWICE A DAY BEFORE MEALS   sodium chloride (OCEAN) 0.65 % SOLN nasal spray Place 1 spray into both nostrils as needed for congestion (nose irritation).   [DISCONTINUED] LIVALO 2 MG TABS TAKE 1 TABLET DAILY     Allergies:   Bee venom, Lipitor [atorvastatin], Morphine, Metformin and related, Robaxin [methocarbamol], Baclofen, Medrol [methylprednisolone], Simvastatin, Hydrocodone, and Pravastatin   Social History   Socioeconomic History   Marital status: Married    Spouse name: Not on file   Number of children: 2   Years of education: Not on file   Highest education level: Not on file  Occupational History   Occupation: TEACHER    Employer: [employer]  Tobacco Use   Smoking status: Former    Packs/day: 1.50    Years: 20.00    Total pack years: 30.00    Types: Cigarettes    Quit date: 04/21/1991    Years since  quitting: 30.6   Smokeless tobacco: Never  Vaping Use   Vaping Use: Never used  Substance and Sexual Activity   Alcohol use: Yes    Alcohol/week: 3.0 standard drinks of alcohol  Types: 3 Cans of beer per week    Comment: weekly   Drug use: No   Sexual activity: Not on file  Other Topics Concern   Not on file  Social History Narrative   Lives with wife in [place].  Does not routinely exercise.   Social Determinants of Health   Financial Resource Strain: Not on file  Food Insecurity: Not on file  Transportation Needs: Not on file  Physical Activity: Not on file  Stress: Not on file  Social Connections: Not on file     Family History: The patient's family history includes ADD / ADHD in his daughter; Colon polyps in his father; Diabetes in his brother, brother, brother, mother, and sister; Heart attack in his brother and brother; Heart attack (age of onset: 62) in his father; Heart disease in his brother, brother, and brother; Hyperlipidemia in his mother; Hypertension in his father, maternal grandmother, and mother; Obesity in his brother; Stroke in his father, mother, and sister. There is no history of Stomach cancer, Colon cancer, Esophageal cancer, or Rectal cancer.  ROS:   Review of Systems  Constitutional:  Positive for diaphoresis, malaise/fatigue and weight loss. Negative for chills and fever.  HENT: Negative.    Eyes: Negative.   Respiratory:  Positive for shortness of breath. Negative for cough, hemoptysis, sputum production and wheezing.        With exertion - see HPI.   Cardiovascular:  Negative for chest pain, palpitations, orthopnea, claudication, leg swelling and PND.  Gastrointestinal: Negative.   Genitourinary: Negative.   Musculoskeletal:  Positive for myalgias. Negative for back pain, falls, joint pain and neck pain.       Left arm pain, non-radiating - recently turned off stimulator - see HPI.   Skin:  Negative for itching and rash.  Neurological:  Negative.   Endo/Heme/Allergies: Negative.   Psychiatric/Behavioral: Negative.  Negative for suicidal ideas.    Please see the history of present illness.    All other systems reviewed and are negative.  EKGs/Labs/Other Studies Reviewed:    The following studies were reviewed today:   EKG:  EKG is ordered today.  The ekg ordered today demonstrates sinus arrthymia,80 bpm, otherwise normal ECG.    Lexiscan stress test on July 25, 2021: This study is normal.  This study is low risk.  No ST deviation was noted. Left ventricular function is normal.  Nuclear stress EF: 62%.  LVEF is normal at 55-65%.  End-diastolic cavity size is normal.   14-day ZIO on July 07, 2019: Predominant rhythm is sinus rhythm, average heart rate 81 bpm, rare PVCs-occasionally symptomatic/triggered.   Rare episode of PAT (4-12 beats)-benign.   No pauses and no atrial fibrillation noted.  Coronary CTA on December 22, 2011: 1.  Extensive mixed plaque in the proximal to mid LAD with mild some at most moderate stenosis (likely less than 50%). 2.  Extensive mixed plaque in the mid to distal RCA.  There was probable artifact obscuring the mid RCA but cannot rule out significant stenosis at that site. 3.  Coronary artery calcium score of 1014, placing the patient in the 95th percentile for age and gender.  This suggest high risk of future cardiac events.  An absolute coronary artery calcium score of greater than 400 suggest high risk of obstructive CAD. 4.  With the extensive plaque and question of significant mid RCA lesion, would suggest cardiac catheterization. 5.  Bilateral gynecomastia; otherwise, no significant extracardiac findings.  2D Echo on 12/22/2011: LVEF  is 55-60%.  Mild regurgitation of aortic valve and mitral valve.  Left atrium was mildly dilated.  Overall normal study.  Cardiac catheterization on December 23, 2011: 70% mid RCA that was managed medically      Recent Labs: 05/22/2021: TSH  2.47 11/18/2021: ALT 15; BUN 23; Creatinine 1.19; Hemoglobin 12.3; Platelet Count 196; Potassium 4.7; Sodium 141  Recent Lipid Panel    Component Value Date/Time   CHOL 143 05/22/2021 0755   TRIG 289.0 (H) 05/22/2021 0755   HDL 49.80 05/22/2021 0755   CHOLHDL 3 05/22/2021 0755   VLDL 57.8 (H) 05/22/2021 0755   LDLCALC 45 01/20/2021 0742   LDLCALC 83 01/11/2020 1543   LDLDIRECT 74.0 05/22/2021 0755     Risk Assessment/Calculations:    The 10-year ASCVD risk score (Arnett DK, et al., 2019) is: 21%   Values used to calculate the score:     Age: 28 years     Sex: Male     Is Non-Hispanic African American: No     Diabetic: Yes     Tobacco smoker: No     Systolic Blood Pressure: 161 mmHg     Is BP treated: Yes     HDL Cholesterol: 49.8 mg/dL     Total Cholesterol: 143 mg/dL       Physical Exam:    VS:  BP 120/82 (BP Location: Right Arm, Patient Position: Sitting, Cuff Size: Large)   Pulse 80   Ht '5\' 8"'$  (1.727 m)   Wt 257 lb (116.6 kg)   SpO2 96%   BMI 39.08 kg/m     Wt Readings from Last 3 Encounters:  11/24/21 257 lb (116.6 kg)  10/06/21 260 lb (117.9 kg)  09/01/21 263 lb (119.3 kg)     GEN: Well nourished, well developed in no acute distress HEENT: Normal NECK: No JVD; No carotid bruits CARDIAC: Fast rate and regular rhythm, no murmurs, rubs, gallops; 2+ peripheral pulses throughout, strong and equal bilaterally RESPIRATORY:  Clear and diminished to auscultation without rales, wheezing or rhonchi  ABDOMEN: Soft, non-tender, distended, bowel sounds X 4 MUSCULOSKELETAL:  No edema; No deformity  SKIN: Warm and dry NEUROLOGIC:  Alert and oriented x 3 PSYCHIATRIC:  Normal affect   ASSESSMENT:    1. DOE (dyspnea on exertion)   2. Essential hypertension    PLAN:    In order of problems listed above:  DOE, Fatigue  - subacute, progressing He did state at his last office visit he was having dyspnea on exertion but has noticed that this and extreme fatigue have  gotten worse over the past month, especially this previous weekend. Plan ischemic eval with cath as outlined below. Last echocardiogram was done in 2013 and revealed LVEF was 55 to 60%.  Mild regurgitation noted of aortic valve and mitral valve, otherwise normal exam.  We will go ahead and repeat 2D echo and obtain CBC, BMET, and TSH today in office.  2. Coronary artery disease, HLD with goal LDL < 70 - chronic, stable Previous left heart cath done in 2013 revealed 70% mid RCA stenosis that was managed medically. We discussed with Dr. Marlou Porch in clinic today regarding potential heart cath and he agrees this would be indicated.  I discussed with patient and his wife that is very well likely that more plaque has built up causing his symptoms and he may require to repeat left heart cath.  I explained risk and benefits of this procedure as outlined below and they are agreeable to  proceed.  We will obtain a CBC and BMET today and follow-up with a repeat BMET in 1 to 2 weeks when he follows up with [place] in office. He will hold his HCTZ, glyburide and insulin the day of cath. Last LDL at goal at 45 in October 2022.  Continue to follow-up with PCP who is managing his hyperlipidemia.  Continue Livalo 2 mg daily. Continue Metoprolol and will add back Aspirin 81 mg EC tablet daily to medication regimen. He denies any recent symptoms of bleeding. Will need to follow carefully on aspirin.    Shared Decision Making/Informed Consent The risks [stroke (1 in 1000), death (1 in 1000), kidney failure [usually temporary] (1 in 500), bleeding (1 in 200), allergic reaction [possibly serious] (1 in 200)], benefits (diagnostic support and management of coronary artery disease) and alternatives of a cardiac catheterization were discussed in detail with Brent [name] and he is willing to proceed.  3. Labile hypertension - chronic, not progressing Initial blood pressure 144/86 and repeat blood pressure was 120/82 in the office today though  patient reports extremely labile high values at home at times. Was only taking HCTZ PRN before. Continue current antihypertensive regimen, and initiate hydrochlorothiazide 12.5 mg daily, instead of as needed. Discussed to monitor BP at home at least 2 hours after medications and sitting for 5-10 minutes.  Discussed to monitor blood pressure at home and to let us know if continually getting elevated readings of greater than 628 systolic.  Verbalized understanding. Will obtain a BMET and TSH today.    4. Alcohol use disorder - chronic, stable Does admit to occasional use and defines this as daily and sometimes up to the maximum of 3 times a day.  I discussed with him about health risk of excessive alcohol use.  I encouraged him to cut down his alcohol consumption.  He verbalized understanding.  5. PAT - chronic, stable No recent palpitations. Continue metoprolol.   5. Disposition -follow-up in 2 weeks post left heart cath or sooner if needed.    Medication Adjustments/Labs and Tests Ordered: Current medicines are reviewed at length with the patient today.  Concerns regarding medicines are outlined above.  Orders Placed This Encounter  Procedures   Basic Metabolic Panel (BMET)   CBC   TSH   Basic Metabolic Panel (BMET)   EKG 12-Lead   ECHOCARDIOGRAM COMPLETE   Meds ordered this encounter  Medications   aspirin EC 81 MG tablet    Sig: Take 1 tablet (81 mg total) by mouth daily. Swallow whole.    Dispense:  90 tablet    Refill:  3   hydrochlorothiazide (MICROZIDE) 12.5 MG capsule    Sig: Take 1 capsule (12.5 mg total) by mouth daily.    Dispense:  90 capsule    Refill:  1    Patient Instructions  Medication Instructions:  START Aspirin '81mg'$  Take 1 tablet once a day  START Hydrochlorothiazide 12.'5mg'$  tablet  *If you need a refill on your cardiac medications before your next appointment, please call your pharmacy*   Lab Work: TODAY-BMET, CBC, TSH 2 WEEKS REPEAT BMET If you have  labs (blood work) drawn today and your tests are completely normal, you will receive your results only by: [place] (if you have MyChart) OR A paper copy in the mail If you have any lab test that is abnormal or we need to change your treatment, we will call you to review the results.   Testing/Procedures: Your physician has requested that  you have an echocardiogram. Echocardiography is a painless test that uses sound waves to create images of your heart. It provides your doctor with information about the size and shape of your heart and how well your heart's chambers and valves are working. This procedure takes approximately one hour. There are no restrictions for this procedure.  Your physician has requested that you have a cardiac catheterization. Cardiac catheterization is used to diagnose and/or treat various heart conditions. Doctors may recommend this procedure for a number of different reasons. The most common reason is to evaluate chest pain. Chest pain can be a symptom of coronary artery disease (CAD), and cardiac catheterization can show whether plaque is narrowing or blocking your heart's arteries. This procedure is also used to evaluate the valves, as well as measure the blood flow and oxygen levels in different parts of your heart. For further information please visit HugeFiesta.[email or web address]. Please follow instruction sheet, as given.    Follow-Up: At [facility], you and your health needs are our priority.  As part of our continuing mission to provide you with exceptional heart care, we have created designated Provider Care Teams.  These Care Teams include your primary Cardiologist (physician) and Advanced Practice Providers (APPs -  Physician Assistants and Nurse Practitioners) who all work together to provide you with the care you need, when you need it.  We recommend signing up for the patient portal called "MyChart".  Sign up information is provided on this After Visit  Summary.  MyChart is used to connect with patients for Virtual Visits (Telemedicine).  Patients are able to view lab/test results, encounter notes, upcoming appointments, etc.  Non-urgent messages can be sent to your provider as well.   To learn more about what you can do with MyChart, go to [email or web address].    Your next appointment:   2 week(s)  The format for your next appointment:   In Person  Provider:   Melina Copa, PA-C Finis Bud, NP)     Other Instructions   Important Information About Sugar         Signed, Finis Bud, NP  11/24/2021 1:42 PM    [place]

## 2021-11-24 NOTE — Patient Instructions (Addendum)
Medication Instructions:  START Aspirin '81mg'$  Take 1 tablet once a day  START Hydrochlorothiazide 12.'5mg'$  tablet  *If you need a refill on your cardiac medications before your next appointment, please call your pharmacy*   Lab Work: TODAY-BMET, CBC, TSH 2 WEEKS REPEAT BMET If you have labs (blood work) drawn today and your tests are completely normal, you will receive your results only by: [place] (if you have MyChart) OR A paper copy in the mail If you have any lab test that is abnormal or we need to change your treatment, we will call you to review the results.   Testing/Procedures: Your physician has requested that you have an echocardiogram. Echocardiography is a painless test that uses sound waves to create images of your heart. It provides your doctor with information about the size and shape of your heart and how well your heart's chambers and valves are working. This procedure takes approximately one hour. There are no restrictions for this procedure.  Your physician has requested that you have a cardiac catheterization. Cardiac catheterization is used to diagnose and/or treat various heart conditions. Doctors may recommend this procedure for a number of different reasons. The most common reason is to evaluate chest pain. Chest pain can be a symptom of coronary artery disease (CAD), and cardiac catheterization can show whether plaque is narrowing or blocking your heart's arteries. This procedure is also used to evaluate the valves, as well as measure the blood flow and oxygen levels in different parts of your heart. For further information please visit [email or web address]. Please follow instruction sheet, as given.    Follow-Up: At [facility], you and your health needs are our priority.  As part of our continuing mission to provide you with exceptional heart care, we have created designated Provider Care Teams.  These Care Teams include your primary Cardiologist (physician)  and Advanced Practice Providers (APPs -  Physician Assistants and Nurse Practitioners) who all work together to provide you with the care you need, when you need it.  We recommend signing up for the patient portal called "MyChart".  Sign up information is provided on this After Visit Summary.  MyChart is used to connect with patients for Virtual Visits (Telemedicine).  Patients are able to view lab/test results, encounter notes, upcoming appointments, etc.  Non-urgent messages can be sent to your provider as well.   To learn more about what you can do with MyChart, go to [email or web address].    Your next appointment:   2 week(s)  The format for your next appointment:   In Person  Provider:   Melina Copa, PA-C Finis Bud, NP)     Other Instructions   Important Information About Sugar

## 2021-11-24 NOTE — H&P (View-Only) (Signed)
Cardiology Office Note:    Date:  11/24/2021   ID:  Brent [name], DOB Sep 17, 1955, MRN [id number]  PCP:  [name], MD   [place] Prov[facility] Cardiologist:  Candee Furbish, MD     Referring MD: [name], MD   CC: Extreme fatigue worsening X 1 month, DOE, and labile hypertension  History of Present Illness:    Brent [name] is a 66 y.o. male with a hx of the following:   HTN CAD OSA on CPAP Diverticulosis GERD T2DM Morbid obesity HLD Gout Hx of gastric bypass sx 2012 Hx of colonic polyps Anemia Hematochezia GIB 2014, 2020 (latter with + Gastric Ulcer, Jejunal Marginal Ulcer -> tx with epi/coag IDA Perianal fistula  Previous cardiac work-up includes cardiac catheterization on December 23, 2011 with 70% mid RCA that was managed medically following coronary CT that revealed a calcium score greater 1000.  He has had a history of difficult to control diabetes, hypertension and frequent palpitations. Prior monitor in 06/2019 showed rare PVCs and rare episodes of PAT (no afib). His hyperlipidemia is being managed on Livalo.  He was last seen in the cardiology office by Christen Bame, NP on July 15, 2021.  His activity was limited due to orthopedic pain in his bilateral knees and back.  This had limited his walking.  Denied any chest pain, orthopnea, PND, lightheadedness, presyncope, syncope, or dizziness.  Stated occasional palpitations and lower extremity edema.  Palpitations did not interfere with his daily activities.  Could not determine if his shortness of breath was worse than in the past.  Home blood pressure readings were stable.  No other cardiac complaints at the time.  Lexiscan arranged for him on July 25, 2021 was normal, LVEF is normal, nuclear stress EF: 62%.   Notified our office on November 23, 2021 reporting shortness of breath with exertion and extreme fatigue, worsening over the last month. Associated symptoms recently include diaphoresis and  pain in his left arm. Blood pressure elevated to 180/100 over the weekend, HR 103.   Pain in arm does not radiate, thinks this is due to his stimulator and has turned it off this past weekend.  Wife also thinks it might be due to the dogs jerking while walking them.  Patient's wife is an Therapist, sports and requested that patient be seen in the office today.  Blood pressures have varied over the weekend from 127/75 to 195/100.  This morning his blood pressure was 182/103.  Apple Watch recording he sent to [place] via MyChart appears to be sinus tachycardia, heart rate 103.  Denies any chest pain, palpitations, syncope, presyncope, dizziness, vertigo, orthopnea, PND, swelling, bleeding, or claudication.  Does have some mild lightheaded feeling with bending over, and states he lost 8 pounds in the past 24 hours on his home scale.  Denies any bleeding in his stool or urine. Denies any hemoptysis.  Denies any other question or concern at this time. Feels well today.  Past Medical History:  Diagnosis Date   Acid reflux disease    ACID REFLUX DISEASE 07/05/2007   Acute gastric ulcer with bleeding    Alcohol abuse    Anemia    Atherosclerosis    Benign neoplasm of colon 07/05/2007   Bilateral hip pain 10/05/2016   Bladder cancer ([place])    Breast pain, left 11/17/2011   CAD (coronary artery disease)    Chest pain    a. Reportedly negative dobut echo performed prior to gastric bypass in  03/2011;  b. 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 12/2011 Cath: LM nl, LAD 50p, D1 34m LCX min irregs, OM3 30, RCA 25p, 766mFFR 0.99->0.89), PDA 30, EF 65%, Med Rx.   COLONIC POLYPS, HX OF 10/29/2008   Diarrhea 06/13/2010   Diverticulosis 07/05/2018   DIVERTICULOSIS, COLON 10/29/2008   DM (diabetes mellitus), type 2, uncontrolled    GI bleed    Gout 03/04/2013   Hearing loss 05/24/2013   Previous audiology evaluation completely.   HTN (hypertension) 07/14/2010   Hx of colonic polyps    HYPERSOMNIA, ASSOCIATED WITH SLEEP APNEA  07/26/2008   Hypertension 07/14/2010   Impotence of organic origin 07/05/2007   Internal hemorrhoids    Knee pain, left 10/10/2010   Morbid obesity ([facility]12/11/2009   a. 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   Neck pain 03/2015   Other and unspecified hyperlipidemia 11/16/2012   Post-operative nausea and vomiting    after the surgery in hospital in March 2020/ past the cauderization   Preventative health care 11/17/2011   Renal stone 11/2013   Sleep apnea    a. CPAP   Spinal stenosis    Tear of meniscus of left knee 2012    Past Surgical History:  Procedure Laterality Date   ANKLE SURGERY Right 1994   BICEPS TENDON REPAIR     left side   CARDIAC CATHETERIZATION     denies any chest pain in the past 2 years   COLONOSCOPY     colonoscopy polyps     ESOPHAGOGASTRODUODENOSCOPY N/A 03/27/2013   Procedure: ESOPHAGOGASTRODUODENOSCOPY (EGD);  Surgeon: JoIrene ShipperMD;  [facility];  Service: Endoscopy;  Laterality: N/A;   ESOPHAGOGASTRODUODENOSCOPY (EGD) WITH PROPOFOL N/A 07/06/2018   Procedure: ESOPHAGOGASTRODUODENOSCOPY (EGD) WITH PROPOFOL;  Surgeon: PyJerene BearsMD;  [facility];  Service: Gastroenterology;  Laterality: N/A;   GASTRIC BYPASS     HERNIA REPAIR     HOT HEMOSTASIS N/A 07/06/2018   Procedure: HOT HEMOSTASIS (ARGON PLASMA COAGULATION/BICAP);  Surgeon: PyJerene BearsMD;  [facility];  Service: Gastroenterology;  Laterality: N/A;   KNEE ARTHROSCOPY Left 11/06/2010   Left, torn meniscus (repaired)   LEFT HEART CATHETERIZATION WITH CORONARY ANGIOGRAM N/A 12/23/2011   Procedure: LEFT HEART CATHETERIZATION WITH CORONARY ANGIOGRAM;  Surgeon: JaMinus BreedingMD;  [facility];  Service: Cardiovascular;  Laterality: N/A;   REPLACEMENT TOTAL KNEE Right 01/2016   removed scar tissue/ cut tip of nerve bundle and re-route   right knee arthroscopy Right 07/05/14   Dr. AnHart RobinsonsGSLomira  ROTATOR CUFF REPAIR  2019   left   SCHLEROTHERAPY   07/06/2018   Procedure: SCHLEROTHERAPY;  Surgeon: PyJerene BearsMD;  [facility];  Service: Gastroenterology;;   TONSILLECTOMY  age [age] TONSILLECTOMY     as a child   TOTAL KNEE ARTHROPLASTY Right 01/20/2016   Procedure: RIGHT TOTAL KNEE ARTHROPLASTY;  Surgeon: MaParalee CancelMD;  [facility];  Service: Orthopedics;  Laterality: Right;   TRANSURETHRAL RESECTION OF BLADDER TUMOR N/A 09/24/2020   Procedure: TRANSURETHRAL RESECTION OF BLADDER TUMOR (TURBT)  RIGHT retrograde pylegram;  Surgeon: EsFestus AloeMD;  Location: [facility];  Service: Urology;  Laterality: N/A;   UPPER GI ENDOSCOPY  03/27/13    Current Medications: Current Meds  Medication Sig   acetaminophen (TYLENOL) 500 MG tablet Take 1,000 mg by mouth every 6 (six) hours as needed for moderate pain or headache.   amitriptyline (ELAVIL) 50  MG tablet TAKE 1 TABLET AT BEDTIME   amoxicillin (AMOXIL) 500 MG capsule TAKE 4 CAPSULES BY MOUTH PRIOR TO APPOINTMENT   Cholecalciferol (DIALYVITE VITAMIN D 5000) 125 MCG (5000 UT) capsule Take 5,000 Units by mouth daily.   Cyanocobalamin (VITAMIN B-12) 1000 MCG SUBL Place 1,000 mcg under the tongue once a week.   Dulaglutide (TRULICITY) 1.5 ZO/1.0RU SOPN Trulicity 1.5 EA/5.4 mL subcutaneous pen injector   econazole nitrate 1 % cream Apply topically daily.   EPINEPHrine 0.3 mg/0.3 mL IJ SOAJ injection INJECT 0.3 MLS (0.3 MG TOTAL) INTO THE MUSCLE ONCE FOR 1 DOSE   famotidine (PEPCID) 40 MG tablet TAKE 1 TABLET AT BEDTIME AS NEEDED FOR HEARTBURN OR INDIGESTION   fenofibrate 160 MG tablet TAKE 1 TABLET DAILY   Ferrous Gluconate-C-Folic Acid (IRON-C PO) Take 1 tablet by mouth daily. Iron 65 mg and vit C-125 mg   fluticasone (FLONASE) 50 MCG/ACT nasal spray Place 2 sprays into both nostrils daily.   gabapentin (NEURONTIN) 300 MG capsule TAKE 1 CAPSULE TWICE A DAY AND 2 CAPSULES AT BEDTIME   glucose blood (FREESTYLE LITE) test strip Use to check blood sugar 4 times day.  Dx Code: E11.9    glyBURIDE (DIABETA) 5 MG tablet Take 1 tablet (5 mg total) by mouth daily. (Patient taking differently: Take 2.5 mg by mouth daily.)   hydrochlorothiazide (MICROZIDE) 12.5 MG capsule Take 1 capsule (12.5 mg total) by mouth daily as needed.   insulin lispro (HUMALOG) 100 UNIT/ML KwikPen Inject 2-6 Units into the skin 3 (three) times daily. Per sliding scale.   Insulin Pen Needle (PEN NEEDLES 31GX5/16") 31G X 8 MM MISC Use as directed with Humalog 3 times a day and at bedtime   losartan (COZAAR) 50 MG tablet TAKE 1 TABLET DAILY (KEEP UPCOMING APPOINTMENT IN [facility] 2023 WITH CARDIOLOGIST BEFORE ANYMORE REFILLS)   Magnesium 100 MG CAPS Take 1 capsule (100 mg total) by mouth daily.   metoprolol tartrate (LOPRESSOR) 100 MG tablet Take 1 tablet (100 mg total) by mouth 2 (two) times daily.   mometasone (ELOCON) 0.1 % cream Apply 1 Application topically daily.   Multiple Vitamin (MULTIVITAMIN) tablet Take 1 tablet by mouth daily. BARIATRIC VIT   oxycodone (OXY-IR) 5 MG capsule Take 1 mg by mouth 3 (three) times daily. PRN   pantoprazole (PROTONIX) 40 MG tablet TAKE 1 TABLET TWICE A DAY BEFORE MEALS   sodium chloride (OCEAN) 0.65 % SOLN nasal spray Place 1 spray into both nostrils as needed for congestion (nose irritation).   [DISCONTINUED] LIVALO 2 MG TABS TAKE 1 TABLET DAILY     Allergies:   Bee venom, Lipitor [atorvastatin], Morphine, Metformin and related, Robaxin [methocarbamol], Baclofen, Medrol [methylprednisolone], Simvastatin, Hydrocodone, and Pravastatin   Social History   Socioeconomic History   Marital status: Married    Spouse name: Not on file   Number of children: 2   Years of education: Not on file   Highest education level: Not on file  Occupational History   Occupation: TEACHER    Employer: [employer]  Tobacco Use   Smoking status: Former    Packs/day: 1.50    Years: 20.00    Total pack years: 30.00    Types: Cigarettes    Quit date: 04/21/1991    Years since  quitting: 30.6   Smokeless tobacco: Never  Vaping Use   Vaping Use: Never used  Substance and Sexual Activity   Alcohol use: Yes    Alcohol/week: 3.0 standard drinks of alcohol  Types: 3 Cans of beer per week    Comment: weekly   Drug use: No   Sexual activity: Not on file  Other Topics Concern   Not on file  Social History Narrative   Lives with wife in [place].  Does not routinely exercise.   Social Determinants of Health   Financial Resource Strain: Not on file  Food Insecurity: Not on file  Transportation Needs: Not on file  Physical Activity: Not on file  Stress: Not on file  Social Connections: Not on file     Family History: The patient's family history includes ADD / ADHD in his daughter; Colon polyps in his father; Diabetes in his brother, brother, brother, mother, and sister; Heart attack in his brother and brother; Heart attack (age of onset: 42) in his father; Heart disease in his brother, brother, and brother; Hyperlipidemia in his mother; Hypertension in his father, maternal grandmother, and mother; Obesity in his brother; Stroke in his father, mother, and sister. There is no history of Stomach cancer, Colon cancer, Esophageal cancer, or Rectal cancer.  ROS:   Review of Systems  Constitutional:  Positive for diaphoresis, malaise/fatigue and weight loss. Negative for chills and fever.  HENT: Negative.    Eyes: Negative.   Respiratory:  Positive for shortness of breath. Negative for cough, hemoptysis, sputum production and wheezing.        With exertion - see HPI.   Cardiovascular:  Negative for chest pain, palpitations, orthopnea, claudication, leg swelling and PND.  Gastrointestinal: Negative.   Genitourinary: Negative.   Musculoskeletal:  Positive for myalgias. Negative for back pain, falls, joint pain and neck pain.       Left arm pain, non-radiating - recently turned off stimulator - see HPI.   Skin:  Negative for itching and rash.  Neurological:  Negative.   Endo/Heme/Allergies: Negative.   Psychiatric/Behavioral: Negative.  Negative for suicidal ideas.    Please see the history of present illness.    All other systems reviewed and are negative.  EKGs/Labs/Other Studies Reviewed:    The following studies were reviewed today:   EKG:  EKG is ordered today.  The ekg ordered today demonstrates sinus arrthymia,80 bpm, otherwise normal ECG.    Lexiscan stress test on July 25, 2021: This study is normal.  This study is low risk.  No ST deviation was noted. Left ventricular function is normal.  Nuclear stress EF: 62%.  LVEF is normal at 55-65%.  End-diastolic cavity size is normal.   14-day ZIO on July 07, 2019: Predominant rhythm is sinus rhythm, average heart rate 81 bpm, rare PVCs-occasionally symptomatic/triggered.   Rare episode of PAT (4-12 beats)-benign.   No pauses and no atrial fibrillation noted.  Coronary CTA on December 22, 2011: 1.  Extensive mixed plaque in the proximal to mid LAD with mild some at most moderate stenosis (likely less than 50%). 2.  Extensive mixed plaque in the mid to distal RCA.  There was probable artifact obscuring the mid RCA but cannot rule out significant stenosis at that site. 3.  Coronary artery calcium score of 1014, placing the patient in the 95th percentile for age and gender.  This suggest high risk of future cardiac events.  An absolute coronary artery calcium score of greater than 400 suggest high risk of obstructive CAD. 4.  With the extensive plaque and question of significant mid RCA lesion, would suggest cardiac catheterization. 5.  Bilateral gynecomastia; otherwise, no significant extracardiac findings.  2D Echo on 12/22/2011: LVEF  is 55-60%.  Mild regurgitation of aortic valve and mitral valve.  Left atrium was mildly dilated.  Overall normal study.  Cardiac catheterization on December 23, 2011: 70% mid RCA that was managed medically      Recent Labs: 05/22/2021: TSH  2.47 11/18/2021: ALT 15; BUN 23; Creatinine 1.19; Hemoglobin 12.3; Platelet Count 196; Potassium 4.7; Sodium 141  Recent Lipid Panel    Component Value Date/Time   CHOL 143 05/22/2021 0755   TRIG 289.0 (H) 05/22/2021 0755   HDL 49.80 05/22/2021 0755   CHOLHDL 3 05/22/2021 0755   VLDL 57.8 (H) 05/22/2021 0755   LDLCALC 45 01/20/2021 0742   LDLCALC 83 01/11/2020 1543   LDLDIRECT 74.0 05/22/2021 0755     Risk Assessment/Calculations:    The 10-year ASCVD risk score (Arnett DK, et al., 2019) is: 21%   Values used to calculate the score:     Age: 43 years     Sex: Male     Is Non-Hispanic African American: No     Diabetic: Yes     Tobacco smoker: No     Systolic Blood Pressure: 751 mmHg     Is BP treated: Yes     HDL Cholesterol: 49.8 mg/dL     Total Cholesterol: 143 mg/dL       Physical Exam:    VS:  BP 120/82 (BP Location: Right Arm, Patient Position: Sitting, Cuff Size: Large)   Pulse 80   Ht '5\' 8"'$  (1.727 m)   Wt 257 lb (116.6 kg)   SpO2 96%   BMI 39.08 kg/m     Wt Readings from Last 3 Encounters:  11/24/21 257 lb (116.6 kg)  10/06/21 260 lb (117.9 kg)  09/01/21 263 lb (119.3 kg)     GEN: Well nourished, well developed in no acute distress HEENT: Normal NECK: No JVD; No carotid bruits CARDIAC: Fast rate and regular rhythm, no murmurs, rubs, gallops; 2+ peripheral pulses throughout, strong and equal bilaterally RESPIRATORY:  Clear and diminished to auscultation without rales, wheezing or rhonchi  ABDOMEN: Soft, non-tender, distended, bowel sounds X 4 MUSCULOSKELETAL:  No edema; No deformity  SKIN: Warm and dry NEUROLOGIC:  Alert and oriented x 3 PSYCHIATRIC:  Normal affect   ASSESSMENT:    1. DOE (dyspnea on exertion)   2. Essential hypertension    PLAN:    In order of problems listed above:  DOE, Fatigue  - subacute, progressing He did state at his last office visit he was having dyspnea on exertion but has noticed that this and extreme fatigue have  gotten worse over the past month, especially this previous weekend. Plan ischemic eval with cath as outlined below. Last echocardiogram was done in 2013 and revealed LVEF was 55 to 60%.  Mild regurgitation noted of aortic valve and mitral valve, otherwise normal exam.  We will go ahead and repeat 2D echo and obtain CBC, BMET, and TSH today in office.  2. Coronary artery disease, HLD with goal LDL < 70 - chronic, stable Previous left heart cath done in 2013 revealed 70% mid RCA stenosis that was managed medically. We discussed with Dr. Marlou Porch in clinic today regarding potential heart cath and he agrees this would be indicated.  I discussed with patient and his wife that is very well likely that more plaque has built up causing his symptoms and he may require to repeat left heart cath.  I explained risk and benefits of this procedure as outlined below and they are agreeable to  proceed.  We will obtain a CBC and BMET today and follow-up with a repeat BMET in 1 to 2 weeks when he follows up with [place] in office. He will hold his HCTZ, glyburide and insulin the day of cath. Last LDL at goal at 45 in October 2022.  Continue to follow-up with PCP who is managing his hyperlipidemia.  Continue Livalo 2 mg daily. Continue Metoprolol and will add back Aspirin 81 mg EC tablet daily to medication regimen. He denies any recent symptoms of bleeding. Will need to follow carefully on aspirin.    Shared Decision Making/Informed Consent The risks [stroke (1 in 1000), death (1 in 1000), kidney failure [usually temporary] (1 in 500), bleeding (1 in 200), allergic reaction [possibly serious] (1 in 200)], benefits (diagnostic support and management of coronary artery disease) and alternatives of a cardiac catheterization were discussed in detail with Mr. Zerkle and he is willing to proceed.  3. Labile hypertension - chronic, not progressing Initial blood pressure 144/86 and repeat blood pressure was 120/82 in the office today though  patient reports extremely labile high values at home at times. Was only taking HCTZ PRN before. Continue current antihypertensive regimen, and initiate hydrochlorothiazide 12.5 mg daily, instead of as needed. Discussed to monitor BP at home at least 2 hours after medications and sitting for 5-10 minutes.  Discussed to monitor blood pressure at home and to let us know if continually getting elevated readings of greater than 381 systolic.  Verbalized understanding. Will obtain a BMET and TSH today.    4. Alcohol use disorder - chronic, stable Does admit to occasional use and defines this as daily and sometimes up to the maximum of 3 times a day.  I discussed with him about health risk of excessive alcohol use.  I encouraged him to cut down his alcohol consumption.  He verbalized understanding.  5. PAT - chronic, stable No recent palpitations. Continue metoprolol.   5. Disposition -follow-up in 2 weeks post left heart cath or sooner if needed.    Medication Adjustments/Labs and Tests Ordered: Current medicines are reviewed at length with the patient today.  Concerns regarding medicines are outlined above.  Orders Placed This Encounter  Procedures   Basic Metabolic Panel (BMET)   CBC   TSH   Basic Metabolic Panel (BMET)   EKG 12-Lead   ECHOCARDIOGRAM COMPLETE   Meds ordered this encounter  Medications   aspirin EC 81 MG tablet    Sig: Take 1 tablet (81 mg total) by mouth daily. Swallow whole.    Dispense:  90 tablet    Refill:  3   hydrochlorothiazide (MICROZIDE) 12.5 MG capsule    Sig: Take 1 capsule (12.5 mg total) by mouth daily.    Dispense:  90 capsule    Refill:  1    Patient Instructions  Medication Instructions:  START Aspirin '81mg'$  Take 1 tablet once a day  START Hydrochlorothiazide 12.'5mg'$  tablet  *If you need a refill on your cardiac medications before your next appointment, please call your pharmacy*   Lab Work: TODAY-BMET, CBC, TSH 2 WEEKS REPEAT BMET If you have  labs (blood work) drawn today and your tests are completely normal, you will receive your results only by: [place] (if you have MyChart) OR A paper copy in the mail If you have any lab test that is abnormal or we need to change your treatment, we will call you to review the results.   Testing/Procedures: Your physician has requested that  you have an echocardiogram. Echocardiography is a painless test that uses sound waves to create images of your heart. It provides your doctor with information about the size and shape of your heart and how well your heart's chambers and valves are working. This procedure takes approximately one hour. There are no restrictions for this procedure.  Your physician has requested that you have a cardiac catheterization. Cardiac catheterization is used to diagnose and/or treat various heart conditions. Doctors may recommend this procedure for a number of different reasons. The most common reason is to evaluate chest pain. Chest pain can be a symptom of coronary artery disease (CAD), and cardiac catheterization can show whether plaque is narrowing or blocking your heart's arteries. This procedure is also used to evaluate the valves, as well as measure the blood flow and oxygen levels in different parts of your heart. For further information please visit [email or web address].[email or web address]. Please follow instruction sheet, as given.    Follow-Up: At [facility], you and your health needs are our priority.  As part of our continuing mission to provide you with exceptional heart care, we have created designated Provider Care Teams.  These Care Teams include your primary Cardiologist (physician) and Advanced Practice Providers (APPs -  Physician Assistants and Nurse Practitioners) who all work together to provide you with the care you need, when you need it.  We recommend signing up for the patient portal called "MyChart".  Sign up information is provided on this After Visit  Summary.  MyChart is used to connect with patients for Virtual Visits (Telemedicine).  Patients are able to view lab/test results, encounter notes, upcoming appointments, etc.  Non-urgent messages can be sent to your provider as well.   To learn more about what you can do with MyChart, go to [email or web address].    Your next appointment:   2 week(s)  The format for your next appointment:   In Person  Provider:   Melina Copa, PA-C Finis Bud, NP)     Other Instructions   Important Information About Sugar         Signed, Finis Bud, NP  11/24/2021 1:42 PM    [place]

## 2021-11-25 LAB — BASIC METABOLIC PANEL
BUN/Creatinine Ratio: 19 (ref 10–24)
BUN: 22 mg/dL (ref 8–27)
CO2: 24 mmol/L (ref 20–29)
Calcium: 9.6 mg/dL (ref 8.6–10.2)
Chloride: 101 mmol/L (ref 96–106)
Creatinine, Ser: 1.18 mg/dL (ref 0.76–1.27)
Glucose: 148 mg/dL — ABNORMAL HIGH (ref 70–99)
Potassium: 4.5 mmol/L (ref 3.5–5.2)
Sodium: 138 mmol/L (ref 134–144)
eGFR: 68 mL/min/{1.73_m2} (ref >59)

## 2021-11-25 LAB — CBC
Hematocrit: 41.3 % (ref 37.5–51.0)
Hemoglobin: 13.8 g/dL (ref 13.0–17.7)
MCH: 29.6 pg (ref 26.6–33.0)
MCHC: 33.4 g/dL (ref 31.5–35.7)
MCV: 89 fL (ref 79–97)
Platelets: 242 10*3/uL (ref 150–450)
RBC: 4.66 x10E6/uL (ref 4.14–5.80)
RDW: 12.6 % (ref 11.6–15.4)
WBC: 5.8 10*3/uL (ref 3.4–10.8)

## 2021-11-25 LAB — TSH: TSH: 1.34 u[IU]/mL (ref 0.450–4.500)

## 2021-11-28 ENCOUNTER — Ambulatory Visit (HOSPITAL_COMMUNITY)
Admission: RE | Admit: 2021-11-28 | Discharge: 2021-11-28 | Disposition: A | Discharge status: Home / Self Care | Payer: Medicare Other | Attending: Cardiology | Admitting: Cardiology

## 2021-11-28 ENCOUNTER — Other Ambulatory Visit (HOSPITAL_COMMUNITY): Payer: Self-pay

## 2021-11-28 ENCOUNTER — Encounter (HOSPITAL_COMMUNITY)
Admission: RE | Disposition: A | Discharge status: Home / Self Care | Payer: Self-pay | Source: Home / Self Care | Attending: Cardiology

## 2021-11-28 ENCOUNTER — Other Ambulatory Visit: Payer: Self-pay

## 2021-11-28 DIAGNOSIS — Z79899 Other long term (current) drug therapy: Secondary | ICD-10-CM | POA: Diagnosis not present

## 2021-11-28 DIAGNOSIS — I471 Supraventricular tachycardia: Secondary | ICD-10-CM | POA: Diagnosis not present

## 2021-11-28 DIAGNOSIS — Z955 Presence of coronary angioplasty implant and graft: Secondary | ICD-10-CM | POA: Diagnosis not present

## 2021-11-28 DIAGNOSIS — I2584 Coronary atherosclerosis due to calcified coronary lesion: Secondary | ICD-10-CM | POA: Insufficient documentation

## 2021-11-28 DIAGNOSIS — R0609 Other forms of dyspnea: Secondary | ICD-10-CM | POA: Diagnosis not present

## 2021-11-28 DIAGNOSIS — I10 Essential (primary) hypertension: Secondary | ICD-10-CM | POA: Diagnosis not present

## 2021-11-28 DIAGNOSIS — E785 Hyperlipidemia, unspecified: Secondary | ICD-10-CM | POA: Diagnosis not present

## 2021-11-28 DIAGNOSIS — Z87891 Personal history of nicotine dependence: Secondary | ICD-10-CM | POA: Insufficient documentation

## 2021-11-28 DIAGNOSIS — I2510 Atherosclerotic heart disease of native coronary artery without angina pectoris: Secondary | ICD-10-CM | POA: Diagnosis not present

## 2021-11-28 DIAGNOSIS — Z7985 Long-term (current) use of injectable non-insulin antidiabetic drugs: Secondary | ICD-10-CM | POA: Diagnosis not present

## 2021-11-28 DIAGNOSIS — F1010 Alcohol abuse, uncomplicated: Secondary | ICD-10-CM | POA: Diagnosis not present

## 2021-11-28 DIAGNOSIS — Z7984 Long term (current) use of oral hypoglycemic drugs: Secondary | ICD-10-CM | POA: Insufficient documentation

## 2021-11-28 DIAGNOSIS — Z794 Long term (current) use of insulin: Secondary | ICD-10-CM | POA: Insufficient documentation

## 2021-11-28 DIAGNOSIS — E119 Type 2 diabetes mellitus without complications: Secondary | ICD-10-CM | POA: Diagnosis not present

## 2021-11-28 HISTORY — PX: CORONARY STENT INTERVENTION: CATH118234

## 2021-11-28 HISTORY — PX: LEFT HEART CATH AND CORONARY ANGIOGRAPHY: CATH118249

## 2021-11-28 LAB — POCT ACTIVATED CLOTTING TIME
Activated Clotting Time: 239 seconds
Activated Clotting Time: 239 seconds

## 2021-11-28 LAB — GLUCOSE, CAPILLARY
Glucose-Capillary: 143 mg/dL — ABNORMAL HIGH (ref 70–99)
Glucose-Capillary: 135 mg/dL — ABNORMAL HIGH (ref 70–99)

## 2021-11-28 SURGERY — LEFT HEART CATH AND CORONARY ANGIOGRAPHY
Anesthesia: LOCAL

## 2021-11-28 MED ORDER — HEPARIN SODIUM (PORCINE) 1000 UNIT/ML IJ SOLN
INTRAMUSCULAR | Status: DC | PRN
  Administered 2021-11-28: 5000 [IU] via INTRAVENOUS
  Administered 2021-11-28: 3000 [IU] via INTRAVENOUS
  Administered 2021-11-28: 2000 [IU] via INTRAVENOUS
  Administered 2021-11-28: 6000 [IU] via INTRAVENOUS

## 2021-11-28 MED ORDER — CLOPIDOGREL BISULFATE 75 MG PO TABS
75.0000 mg | ORAL_TABLET | Freq: Every day | ORAL | Status: DC
Start: 2021-11-29 — End: 2021-11-28

## 2021-11-28 MED ORDER — FAMOTIDINE IN NACL 20-0.9 MG/50ML-% IV SOLN
INTRAVENOUS | Status: AC
  Filled 2021-11-28: qty 50

## 2021-11-28 MED ORDER — SODIUM CHLORIDE 0.9 % WEIGHT BASED INFUSION: 1.0000 mL/kg/h | INTRAVENOUS | Status: DC

## 2021-11-28 MED ORDER — NITROGLYCERIN 0.4 MG SL SUBL
0.4000 mg | SUBLINGUAL_TABLET | SUBLINGUAL | 2 refills | Status: DC | PRN
  Filled 2021-11-28: qty 25, 7d supply, fill #0

## 2021-11-28 MED ORDER — CLOPIDOGREL BISULFATE 300 MG PO TABS
ORAL_TABLET | ORAL | Status: AC
  Filled 2021-11-28: qty 2

## 2021-11-28 MED ORDER — LABETALOL HCL 5 MG/ML IV SOLN: 10.0000 mg | INTRAVENOUS | Status: AC | PRN

## 2021-11-28 MED ORDER — IOHEXOL 350 MG/ML SOLN
INTRAVENOUS | Status: DC | PRN
  Administered 2021-11-28: 120 mL

## 2021-11-28 MED ORDER — SODIUM CHLORIDE 0.9 % WEIGHT BASED INFUSION
3.0000 mL/kg/h | INTRAVENOUS | Status: AC
  Administered 2021-11-28: 3 mL/kg/h via INTRAVENOUS

## 2021-11-28 MED ORDER — ONDANSETRON HCL 4 MG/2ML IJ SOLN: 4.0000 mg | Freq: Four times a day (QID) | INTRAMUSCULAR | Status: DC | PRN

## 2021-11-28 MED ORDER — VERAPAMIL HCL 2.5 MG/ML IV SOLN
INTRAVENOUS | Status: AC
  Filled 2021-11-28: qty 2

## 2021-11-28 MED ORDER — SODIUM CHLORIDE 0.9 % IV SOLN: 250.0000 mL | INTRAVENOUS | Status: DC | PRN

## 2021-11-28 MED ORDER — HYDRALAZINE HCL 20 MG/ML IJ SOLN: 10.0000 mg | INTRAMUSCULAR | Status: AC | PRN

## 2021-11-28 MED ORDER — MIDAZOLAM HCL 2 MG/2ML IJ SOLN
INTRAMUSCULAR | Status: AC
  Filled 2021-11-28: qty 2

## 2021-11-28 MED ORDER — NITROGLYCERIN 1 MG/10 ML FOR IR/CATH LAB
INTRA_ARTERIAL | Status: DC | PRN
  Administered 2021-11-28 (×3): 200 ug via INTRACORONARY

## 2021-11-28 MED ORDER — HEPARIN (PORCINE) IN NACL 1000-0.9 UT/500ML-% IV SOLN
INTRAVENOUS | Status: DC | PRN
  Administered 2021-11-28 (×2): 500 mL

## 2021-11-28 MED ORDER — LIDOCAINE HCL (PF) 1 % IJ SOLN
INTRAMUSCULAR | Status: AC
  Filled 2021-11-28: qty 30

## 2021-11-28 MED ORDER — SODIUM CHLORIDE 0.9% FLUSH
3.0000 mL | INTRAVENOUS | Status: DC | PRN
Start: 2021-11-28 — End: 2021-11-28

## 2021-11-28 MED ORDER — MIDAZOLAM HCL 2 MG/2ML IJ SOLN
INTRAMUSCULAR | Status: DC | PRN
  Administered 2021-11-28: 2 mg via INTRAVENOUS

## 2021-11-28 MED ORDER — CLOPIDOGREL BISULFATE 75 MG PO TABS
75.0000 mg | ORAL_TABLET | Freq: Every day | ORAL | 2 refills | Status: DC
  Filled 2021-11-28: qty 30, 30d supply, fill #0

## 2021-11-28 MED ORDER — SODIUM CHLORIDE 0.9 % WEIGHT BASED INFUSION: 1.0000 mL/kg/h | INTRAVENOUS | Status: AC

## 2021-11-28 MED ORDER — FAMOTIDINE IN NACL 20-0.9 MG/50ML-% IV SOLN
INTRAVENOUS | Status: DC | PRN
  Administered 2021-11-28: 20 mg via INTRAVENOUS

## 2021-11-28 MED ORDER — HEPARIN SODIUM (PORCINE) 1000 UNIT/ML IJ SOLN
INTRAMUSCULAR | Status: AC
  Filled 2021-11-28: qty 10

## 2021-11-28 MED ORDER — VERAPAMIL HCL 2.5 MG/ML IV SOLN
INTRAVENOUS | Status: DC | PRN
  Administered 2021-11-28: 10 mL via INTRA_ARTERIAL

## 2021-11-28 MED ORDER — CLOPIDOGREL BISULFATE 300 MG PO TABS
ORAL_TABLET | ORAL | Status: DC | PRN
  Administered 2021-11-28: 600 mg via ORAL

## 2021-11-28 MED ORDER — NITROGLYCERIN 1 MG/10 ML FOR IR/CATH LAB
INTRA_ARTERIAL | Status: AC
  Filled 2021-11-28: qty 10

## 2021-11-28 MED ORDER — FENTANYL CITRATE (PF) 100 MCG/2ML IJ SOLN
INTRAMUSCULAR | Status: DC | PRN
  Administered 2021-11-28: 50 ug via INTRAVENOUS

## 2021-11-28 MED ORDER — HEPARIN SODIUM (PORCINE) 1000 UNIT/ML IJ SOLN
INTRAMUSCULAR | Status: AC
Start: 2021-11-28 — End: ?
  Filled 2021-11-28: qty 10

## 2021-11-28 MED ORDER — FENTANYL CITRATE (PF) 100 MCG/2ML IJ SOLN
INTRAMUSCULAR | Status: AC
  Filled 2021-11-28: qty 2

## 2021-11-28 MED ORDER — LIDOCAINE HCL (PF) 1 % IJ SOLN
INTRAMUSCULAR | Status: DC | PRN
  Administered 2021-11-28: 2 mL

## 2021-11-28 MED ORDER — SODIUM CHLORIDE 0.9% FLUSH: 3.0000 mL | INTRAVENOUS | Status: DC | PRN

## 2021-11-28 MED ORDER — ACETAMINOPHEN 325 MG PO TABS: 650.0000 mg | ORAL_TABLET | ORAL | Status: DC | PRN

## 2021-11-28 MED ORDER — SODIUM CHLORIDE 0.9% FLUSH
3.0000 mL | Freq: Two times a day (BID) | INTRAVENOUS | Status: DC
Start: 2021-11-28 — End: 2021-11-28

## 2021-11-28 MED ORDER — HEPARIN (PORCINE) IN NACL 1000-0.9 UT/500ML-% IV SOLN
INTRAVENOUS | Status: AC
  Filled 2021-11-28: qty 1000

## 2021-11-28 MED ORDER — ASPIRIN 81 MG PO CHEW: 81.0000 mg | CHEWABLE_TABLET | ORAL | Status: AC

## 2021-11-28 SURGICAL SUPPLY — 20 items
BALL SAPPHIRE NC24 4.5X8 (BALLOONS) ×2
BALLN SAPPHIRE 2.5X12 (BALLOONS) ×2
BALLOON SAPPHIRE 2.5X12 (BALLOONS) IMPLANT
BALLOON SAPPHIRE NC24 4.5X8 (BALLOONS) IMPLANT
CATH 5FR JL3.5 JR4 ANG PIG MP (CATHETERS) ×1 IMPLANT
CATH LAUNCHER 6FR AL1 (CATHETERS) IMPLANT
CATHETER LAUNCHER 6FR AL1 (CATHETERS) ×2
DEVICE RAD COMP TR BAND LRG (VASCULAR PRODUCTS) ×1 IMPLANT
GLIDESHEATH SLEND SS 6F .021 (SHEATH) ×1 IMPLANT
GUIDEWIRE INQWIRE 1.5J.035X260 (WIRE) IMPLANT
INQWIRE 1.5J .035X260CM (WIRE) ×2
KIT ENCORE 26 ADVANTAGE (KITS) ×1 IMPLANT
KIT HEART LEFT (KITS) ×2 IMPLANT
PACK CARDIAC CATHETERIZATION (CUSTOM PROCEDURE TRAY) ×2 IMPLANT
STENT SYNERGY XD 4.0X16 (Permanent Stent) IMPLANT
SYNERGY XD 4.0X16 (Permanent Stent) ×2 IMPLANT
TRANSDUCER W/STOPCOCK (MISCELLANEOUS) ×2 IMPLANT
TUBING CIL FLEX 10 FLL-RA (TUBING) ×2 IMPLANT
WIRE ASAHI PROWATER 180CM (WIRE) ×1 IMPLANT
WIRE ASAHI PROWATER 300CM (WIRE) ×1 IMPLANT

## 2021-11-28 NOTE — Discharge Summary (Signed)
Discharge Summary for Same Day PCI   Patient ID: Brent [name] MRN: [id number]; DOB: 12-20-1955  Admit date: 11/28/2021 Discharge date: 11/28/2021  Primary Care Provider: [name], MD  Primary Cardiologist: Candee Furbish, MD  Primary Electrophysiologist:  None   Discharge Diagnoses    Principal Problem:   DOE (dyspnea on exertion) Active Problems:   CAD (coronary artery disease)  Diagnostic Studies/Procedures    Cardiac Catheterization 11/28/2021:    Mid LAD lesion is 40% stenosed.   1st Diag lesion is 40% stenosed.   Mid Cx to Dist Cx lesion is 40% stenosed.   Prox RCA to Mid RCA lesion is 90% stenosed.   A drug-eluting stent was successfully placed using a SYNERGY XD 4.0X16.   Post intervention, there is a 0% residual stenosis.   The left ventricular systolic function is normal.   LV end diastolic pressure is normal.   The left ventricular ejection fraction is 55-65% by visual estimate.   Single vessel obstructive CAD involving the mid RCA Normal LV function Normal LVEDP Successful PCI of the mid RCA with DES x 1     Plan: DAPT for 6 months. Anticipate same day DC.    Diagnostic Dominance: Right  Intervention   _____________   History of Present Illness     Brent [name] is a 66 y.o. male with hypertension, hyperlipidemia, OSA on CPAP, diabetes, GI bleed and nonobstructive CAD.  Followed by Dr. Marlou Porch as an outpatient.  Underwent cardiac catheterization 12/2011 with 70% mid RCA lesion managed medically.  Seen in the office 06/2021 and reported limited activity due to orthopedic pain.  Also reported palpitations and shortness of breath.  Underwent Lexiscan Myoview 07/25/2021 which was low risk with LVEF of 62%.  He called the office 11/23/2021 reporting worsening shortness of breath with exertion and extreme fatigue.  Seen in the office on 8/7 with Finis Bud, NP with recommendations to set up for outpatient cardiac catheterization as well as  echocardiogram.   Hospital Course     The patient underwent cardiac cath as noted above with single-vessel obstructive CAD involving the mid RCA with successful PCI/DES x1. Plan for DAPT with ASA/Plavix for at least 6 months. The patient was seen by cardiac rehab while in short stay. There were no observed complications post cath. Radial cath site was re-evaluated prior to discharge and found to be stable without any complications. Instructions/precautions regarding cath site care were given prior to discharge.   [name] was seen by Dr. Martinique and determined stable for discharge home. Follow up with our office has been arranged. Medications are listed below. Pertinent changes include addition of Plavix. Continued on home meds without additional changes.   _____________  Cath/PCI Registry Performance & Quality Measures: Aspirin prescribed? - Yes ADP Receptor Inhibitor (Plavix/Clopidogrel, Brilinta/Ticagrelor or Effient/Prasugrel) prescribed (includes medically managed patients)? - Yes High Intensity Statin (Lipitor 40-'80mg'$  or Crestor 20-'40mg'$ ) prescribed? - No - intolerant For EF <40%, was ACEI/ARB prescribed? - Not Applicable (EF >/= 42%) For EF <40%, Aldosterone Antagonist (Spironolactone or Eplerenone) prescribed? - Not Applicable (EF >/= 35%) Cardiac Rehab Phase II ordered (Included Medically managed Patients)? - Yes  _____________   Discharge Vitals Blood pressure (!) 149/83, pulse 77, temperature 98.2 F (36.8 C), resp. rate 16, height '5\' 8"'$  (1.727 m), weight 115.7 kg, SpO2 97 %.  Filed Weights   11/28/21 0500  Weight: 115.7 kg    Last Labs & Radiologic Studies    CBC No results for  input(s): "WBC", "NEUTROABS", "HGB", "HCT", "MCV", "PLT" in the last 72 hours. Basic Metabolic Panel No results for input(s): "NA", "K", "CL", "CO2", "GLUCOSE", "BUN", "CREATININE", "CALCIUM", "MG", "PHOS" in the last 72 hours. Liver Function Tests No results for input(s): "AST", "ALT",  "ALKPHOS", "BILITOT", "PROT", "ALBUMIN" in the last 72 hours. No results for input(s): "LIPASE", "AMYLASE" in the last 72 hours. High Sensitivity Troponin:   No results for input(s): "TROPONINIHS" in the last 720 hours.  BNP Invalid input(s): "POCBNP" D-Dimer No results for input(s): "DDIMER" in the last 72 hours. Hemoglobin A1C No results for input(s): "HGBA1C" in the last 72 hours. Fasting Lipid Panel No results for input(s): "CHOL", "HDL", "LDLCALC", "TRIG", "CHOLHDL", "LDLDIRECT" in the last 72 hours. Thyroid Function Tests No results for input(s): "TSH", "T4TOTAL", "T3FREE", "THYROIDAB" in the last 72 hours.  Invalid input(s): "FREET3" _____________  CARDIAC CATHETERIZATION  Result Date: 11/28/2021   Mid LAD lesion is 40% stenosed.   1st Diag lesion is 40% stenosed.   Mid Cx to Dist Cx lesion is 40% stenosed.   Prox RCA to Mid RCA lesion is 90% stenosed.   A drug-eluting stent was successfully placed using a SYNERGY XD 4.0X16.   Post intervention, there is a 0% residual stenosis.   The left ventricular systolic function is normal.   LV end diastolic pressure is normal.   The left ventricular ejection fraction is 55-65% by visual estimate. Single vessel obstructive CAD involving the mid RCA Normal LV function Normal LVEDP Successful PCI of the mid RCA with DES x 1 Plan: DAPT for 6 months. Anticipate same day DC.    Disposition   Pt is being discharged home today in good condition.  Follow-up Plans & Appointments     Follow-up Information     [place], Crista Luria, [place] Follow up on 12/11/2021.   Specialty: Cardiology Why: at 3:35pm for your follow up appt with Dr. Marlou Porch' [place] information: [address] [place] [address] [phone]                Discharge Instructions     Amb Referral to Cardiac Rehabilitation   Complete by: As directed    Diagnosis: Coronary Stents   After initial evaluation and assessments completed: Virtual Based Care  may be provided alone or in conjunction with Phase 2 Cardiac Rehab based on patient barriers.: Yes        Discharge Medications   Allergies as of 11/28/2021       Reactions   Bee Venom Anaphylaxis   Lipitor [atorvastatin] Other (See Comments)   Myalgias, memory changes   Morphine Other (See Comments)   hyperactive   Metformin And Related Other (See Comments)   Myalgias and weakness   Robaxin [methocarbamol]    GI bleed   Baclofen    Acted drunk   Medrol [methylprednisolone]    Hyperglycemia    Simvastatin    Joint pain   Hydrocodone Itching   Pravastatin Other (See Comments)   Made joints        Medication List     TAKE these medications    acetaminophen 500 MG tablet Commonly known as: TYLENOL Take 1,000 mg by mouth every 6 (six) hours as needed for moderate pain or headache.   amitriptyline 50 MG tablet Commonly known as: ELAVIL TAKE 1 TABLET AT BEDTIME   amoxicillin 500 MG capsule Commonly known as: AMOXIL Take 2,000 mg by mouth See admin instructions.  TAKE 4 CAPSULES BY MOUTH PRIOR  TO APPOINTMENT   aspirin EC 81 MG tablet Take 1 tablet (81 mg total) by mouth daily. Swallow whole.   clopidogrel 75 MG tablet Commonly known as: PLAVIX Take 1 tablet (75 mg total) by mouth daily with breakfast. Start taking on: November 29, 2021   econazole nitrate 1 % cream Apply topically daily. What changed:  how much to take when to take this reasons to take this   EPINEPHrine 0.3 mg/0.3 mL Soaj injection Commonly known as: EPI-PEN INJECT 0.3 MLS (0.3 MG TOTAL) INTO THE MUSCLE ONCE FOR 1 DOSE   famotidine 40 MG tablet Commonly known as: PEPCID TAKE 1 TABLET AT BEDTIME AS NEEDED FOR HEARTBURN OR INDIGESTION   fenofibrate 160 MG tablet TAKE 1 TABLET DAILY   fluticasone 50 MCG/ACT nasal spray Commonly known as: FLONASE Place 2 sprays into both nostrils daily. What changed:  how much to take when to take this reasons to take this   FREESTYLE LITE test  strip Generic drug: glucose blood Use to check blood sugar 4 times day.  Dx Code: E11.9   gabapentin 300 MG capsule Commonly known as: NEURONTIN TAKE 1 CAPSULE TWICE A DAY AND 2 CAPSULES AT BEDTIME What changed: See the new instructions.   glyBURIDE 5 MG tablet Commonly known as: DIABETA Take 1 tablet (5 mg total) by mouth daily. What changed: how much to take   hydrochlorothiazide 12.5 MG capsule Commonly known as: MICROZIDE Take 1 capsule (12.5 mg total) by mouth daily.   insulin lispro 100 UNIT/ML KwikPen Commonly known as: HUMALOG Inject 2-6 Units into the skin 3 (three) times daily. Per sliding scale. What changed:  when to take this reasons to take this   IRON-C PO Take 1 tablet by mouth daily. Iron 65 mg and vit C-125 mg   Livalo 2 MG Tabs Generic drug: Pitavastatin Calcium Take 1 tablet (2 mg total) by mouth daily.   losartan 50 MG tablet Commonly known as: COZAAR TAKE 1 TABLET DAILY (KEEP UPCOMING APPOINTMENT IN MARCH 2023 WITH CARDIOLOGIST BEFORE ANYMORE REFILLS)   Magnesium 100 MG Caps Take 1 capsule (100 mg total) by mouth daily.   metoprolol tartrate 100 MG tablet Commonly known as: LOPRESSOR Take 1 tablet (100 mg total) by mouth 2 (two) times daily.   mometasone 0.1 % cream Commonly known as: Elocon Apply 1 Application topically daily. What changed:  when to take this reasons to take this   multivitamin tablet Take 1 tablet by mouth daily.   nitroGLYCERIN 0.4 MG SL tablet Commonly known as: Nitrostat Place 1 tablet (0.4 mg total) under the tongue every 5 (five) minutes as needed.   oxycodone 5 MG capsule Commonly known as: OXY-IR Take 5 mg by mouth 3 (three) times daily as needed for pain.   pantoprazole 40 MG tablet Commonly known as: PROTONIX TAKE 1 TABLET TWICE A DAY BEFORE MEALS   PEN NEEDLES 31GX5/16" 31G X 8 MM Misc Use as directed with Humalog 3 times a day and at bedtime   sodium chloride 0.65 % Soln nasal spray Commonly known  as: OCEAN Place 1 spray into both nostrils as needed for congestion (nose irritation).   Trulicity 1.5 QI/3.4VQ Sopn Generic drug: Dulaglutide Inject 1.5 mg into the skin every Friday. Trulicity 1.5 QV/9.5 mL subcutaneous pen injector   Vitamin B-12 1000 MCG Subl Place 1,000 mcg under the tongue 2 (two) times a week.   Vitamin D 50 MCG (2000 UT) tablet Take 6,000 Units by mouth daily.  Allergies Allergies  Allergen Reactions   Bee Venom Anaphylaxis   Lipitor [Atorvastatin] Other (See Comments)    Myalgias, memory changes   Morphine Other (See Comments)    hyperactive   Metformin And Related Other (See Comments)    Myalgias and weakness   Robaxin [Methocarbamol]     GI bleed    Baclofen     Acted drunk   Medrol [Methylprednisolone]     Hyperglycemia    Simvastatin     Joint pain   Hydrocodone Itching   Pravastatin Other (See Comments)    Made joints    Outstanding Labs/Studies   N/a   Duration of Discharge Encounter   Greater than 30 minutes including physician time.  Signed, Reino Bellis, NP 11/28/2021, 2:40 PM

## 2021-11-28 NOTE — Progress Notes (Signed)
TR BAND REMOVAL  LOCATION:    right radial  DEFLATED PER PROTOCOL:    Yes.    TIME BAND OFF / DRESSING APPLIED:    1115   SITE UPON ARRIVAL:    Level 0  SITE AFTER BAND REMOVAL:    Level 0  CIRCULATION SENSATION AND MOVEMENT:    Within Normal Limits   Yes.    COMMENTS:   No complications.

## 2021-11-28 NOTE — Interval H&P Note (Signed)
History and Physical Interval Note:  11/28/2021 7:10 AM  Brent [name]  has presented today for surgery, with the diagnosis of dyspnea on exertion.  The various methods of treatment have been discussed with the patient and family. After consideration of risks, benefits and other options for treatment, the patient has consented to  Procedure(s): LEFT HEART CATH AND CORONARY ANGIOGRAPHY (N/A) as a surgical intervention.  The patient's history has been reviewed, patient examined, no change in status, stable for surgery.  I have reviewed the patient's chart and labs.  Questions were answered to the patient's satisfaction.   Cath Lab Visit (complete for each Cath Lab visit)  Clinical Evaluation Leading to the Procedure:   ACS: No.  Non-ACS:    Anginal Classification: CCS II  Anti-ischemic medical therapy: Minimal Therapy (1 class of medications)  Non-Invasive Test Results: Low-risk stress test findings: cardiac mortality <1%/year  Prior CABG: No previous CABG        Brent [name] [facility] 8/11/20237:10 AM

## 2021-11-28 NOTE — Progress Notes (Signed)
Pt and family educated on risk factors, exercise guidelines, NTG, restrictions, nutrition, and CRP2 referral to [facility]. Pt receptive to education and all questions answered. Pt has concerns with walking as mode of exercise due to low extremity joint pains, expressed interest in aquatic exercises instead. Pt referred to CRP2 to meet requirements.  [place], MS 11/28/2021 1:32 PM

## 2021-11-30 ENCOUNTER — Telehealth: Payer: Self-pay | Admitting: Nurse Practitioner

## 2021-11-30 NOTE — Telephone Encounter (Signed)
S/w patient and checked on him after his left heart cath. He is doing well and says he is feeling better and having more energy day by day. Says his blood pressure readings have improved and are between 130s to 140s. Before this heart cath, he was getting elevated readings up to the 190s. Says his cath wrist site has some bruising, but denies any other complications. He knows and verbalizes understanding about his post cath activity restrictions. I told him he will come back on 12/08/21 for lab work and an Echo and will see [place] back in the office on 12/11/21 for post cath f/u appointment.   Finis Bud, NP

## 2021-12-01 ENCOUNTER — Encounter (HOSPITAL_COMMUNITY): Payer: Self-pay | Admitting: Cardiology

## 2021-12-01 ENCOUNTER — Ambulatory Visit (INDEPENDENT_AMBULATORY_CARE_PROVIDER_SITE_OTHER): Payer: Medicare Other

## 2021-12-01 VITALS — Ht 68.0 in | Wt 255.0 lb

## 2021-12-01 DIAGNOSIS — Z0000 Encounter for general adult medical examination without abnormal findings: Secondary | ICD-10-CM

## 2021-12-01 NOTE — Progress Notes (Addendum)
Subjective:   Brent [name] is a 66 y.o. male who presents for an Initial Medicare Annual Wellness Visit.  I connected with Brent [name] today by telephone and verified that I am speaking with the correct person using two identifiers. Location patient: home Location provider: work Persons participating in the virtual visit: patient, Marine scientist.    I discussed the limitations, risks, security and privacy concerns of performing an evaluation and management service by telephone and the availability of in person appointments. I also discussed with the patient that there may be a patient responsible charge related to this service. The patient expressed understanding and verbally consented to this telephonic visit.    Interactive audio and video telecommunications were attempted between this provider and patient, however failed, due to patient having technical difficulties OR patient did not have access to video capability.  We continued and completed visit with audio only.  Some vital signs may be absent or patient reported.   Time Spent with patient on telephone encounter: 20 minutes   Review of Systems     Cardiac Risk Factors include: advanced age (>54mn, >>10women);diabetes mellitus;dyslipidemia;hypertension;male gender;obesity (BMI >30kg/m2);sedentary lifestyle     Objective:    Today's Vitals   12/01/21 1246  Weight: 255 lb (115.7 kg)  Height: '5\' 8"'$  (1.727 m)   Body mass index is 38.77 kg/m.     12/01/2021   12:50 PM 11/28/2021    6:01 AM 08/27/2021    8:21 AM 02/24/2021   10:21 AM 08/23/2020   10:23 AM 04/26/2020   10:21 AM 12/27/2019    8:53 AM  Advanced Directives  Does Patient Have a Medical Advance Directive? Yes Yes No Yes No No No  Type of AParamedicof ACheverlyLiving will HCushingLiving will  H[place]oydadaLiving will   HCassLiving will  Does patient want to make changes to medical advance  directive?  No - Patient declined     No - Patient declined  Copy of [place] Chart? No - copy requested   No - copy requested   No - copy requested  Would patient like information on creating a medical advance directive?   No - Patient declined  No - Patient declined No - Patient declined No - Patient declined    Current Medications (verified) Outpatient Encounter Medications as of 12/01/2021  Medication Sig   acetaminophen (TYLENOL) 500 MG tablet Take 1,000 mg by mouth every 6 (six) hours as needed for moderate pain or headache.   amitriptyline (ELAVIL) 50 MG tablet TAKE 1 TABLET AT BEDTIME   amoxicillin (AMOXIL) 500 MG capsule Take 2,000 mg by mouth See admin instructions.  TAKE 4 CAPSULES BY MOUTH PRIOR TO APPOINTMENT   aspirin EC 81 MG tablet Take 1 tablet (81 mg total) by mouth daily. Swallow whole.   Cholecalciferol (VITAMIN D) 50 MCG (2000 UT) tablet Take 6,000 Units by mouth daily.   clopidogrel (PLAVIX) 75 MG tablet Take 1 tablet (75 mg total) by mouth daily with breakfast.   Cyanocobalamin (VITAMIN B-12) 1000 MCG SUBL Place 1,000 mcg under the tongue 2 (two) times a week.   Dulaglutide (TRULICITY) 1.5 MEP/3.2RJSOPN Inject 1.5 mg into the skin every Friday. Trulicity 1.5 mJO/8.4mL subcutaneous pen injector   econazole nitrate 1 % cream Apply topically daily. (Patient taking differently: Apply 1 Application topically daily as needed (fungus).)   EPINEPHrine 0.3 mg/0.3 mL IJ SOAJ injection INJECT 0.3 MLS (  0.3 MG TOTAL) INTO THE MUSCLE ONCE FOR 1 DOSE   famotidine (PEPCID) 40 MG tablet TAKE 1 TABLET AT BEDTIME AS NEEDED FOR HEARTBURN OR INDIGESTION   fenofibrate 160 MG tablet TAKE 1 TABLET DAILY   Ferrous Gluconate-C-Folic Acid (IRON-C PO) Take 1 tablet by mouth daily. Iron 65 mg and vit C-125 mg   fluticasone (FLONASE) 50 MCG/ACT nasal spray Place 2 sprays into both nostrils daily. (Patient taking differently: Place 1 spray into both nostrils 2 (two) times daily as  needed for allergies.)   gabapentin (NEURONTIN) 300 MG capsule TAKE 1 CAPSULE TWICE A DAY AND 2 CAPSULES AT BEDTIME (Patient taking differently: Take 300-900 mg by mouth See admin instructions. TAKE 1 CAPSULE TWICE A DAY (Morning and Lunch) AND 3 CAPSULES IN THE EVENING AND AT BEDTIME)   glucose blood (FREESTYLE LITE) test strip Use to check blood sugar 4 times day.  Dx Code: E11.9   glyBURIDE (DIABETA) 5 MG tablet Take 1 tablet (5 mg total) by mouth daily. (Patient taking differently: Take 2.5 mg by mouth daily.)   hydrochlorothiazide (MICROZIDE) 12.5 MG capsule Take 1 capsule (12.5 mg total) by mouth daily.   insulin lispro (HUMALOG) 100 UNIT/ML KwikPen Inject 2-6 Units into the skin 3 (three) times daily. Per sliding scale. (Patient taking differently: Inject 2-6 Units into the skin 3 (three) times daily as needed (high blood sugar). Per sliding scale.)   Insulin Pen Needle (PEN NEEDLES 31GX5/16") 31G X 8 MM MISC Use as directed with Humalog 3 times a day and at bedtime   losartan (COZAAR) 50 MG tablet TAKE 1 TABLET DAILY (KEEP UPCOMING APPOINTMENT IN [facility] 2023 WITH CARDIOLOGIST BEFORE ANYMORE REFILLS)   Magnesium 100 MG CAPS Take 1 capsule (100 mg total) by mouth daily.   metoprolol tartrate (LOPRESSOR) 100 MG tablet Take 1 tablet (100 mg total) by mouth 2 (two) times daily.   mometasone (ELOCON) 0.1 % cream Apply 1 Application topically daily. (Patient taking differently: Apply 1 Application topically daily as needed (fungus).)   Multiple Vitamin (MULTIVITAMIN) tablet Take 1 tablet by mouth daily.   nitroGLYCERIN (NITROSTAT) 0.4 MG SL tablet Place 1 tablet (0.4 mg total) under the tongue every 5 (five) minutes as needed.   oxycodone (OXY-IR) 5 MG capsule Take 5 mg by mouth 3 (three) times daily as needed for pain.   pantoprazole (PROTONIX) 40 MG tablet TAKE 1 TABLET TWICE A DAY BEFORE MEALS   Pitavastatin Calcium (LIVALO) 2 MG TABS Take 1 tablet (2 mg total) by mouth daily.   sodium chloride  (OCEAN) 0.65 % SOLN nasal spray Place 1 spray into both nostrils as needed for congestion (nose irritation).   No facility-administered encounter medications on file as of 12/01/2021.    Allergies (verified) Bee venom, Lipitor [atorvastatin], Morphine, Metformin and related, Robaxin [methocarbamol], Baclofen, Medrol [methylprednisolone], Simvastatin, Hydrocodone, and Pravastatin   History: Past Medical History:  Diagnosis Date   Acid reflux disease    ACID REFLUX DISEASE 07/05/2007   Acute gastric ulcer with bleeding    Alcohol abuse    Anemia    Atherosclerosis    Benign neoplasm of colon 07/05/2007   Bilateral hip pain 10/05/2016   Bladder cancer ([place])    Breast pain, left 11/17/2011   CAD (coronary artery disease)    Chest pain    a. Reportedly negative dobut echo performed prior to gastric bypass in 03/2011;  b. 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 12/2011 Cath: LM nl, LAD 50p, D1 57m  LCX min irregs, OM3 30, RCA 25p, 87m(FFR 0.99->0.89), PDA 30, EF 65%, Med Rx.   COLONIC POLYPS, HX OF 10/29/2008   Diarrhea 06/13/2010   Diverticulosis 07/05/2018   DIVERTICULOSIS, COLON 10/29/2008   DM (diabetes mellitus), type 2, uncontrolled    GI bleed    Gout 03/04/2013   Hearing loss 05/24/2013   Previous audiology evaluation completely.   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   Neck pain 03/2015   Other and unspecified hyperlipidemia 11/16/2012   Post-operative nausea and vomiting    after the surgery in hospital in March 2020/ past the cauderization   Preventative health care 11/17/2011   Renal stone 11/2013   Sleep apnea    a. CPAP   Spinal stenosis    Tear of meniscus of left knee 2012   Past Surgical History:   Procedure Laterality Date   ANKLE SURGERY Right 1994   BICEPS TENDON REPAIR     left side   CARDIAC CATHETERIZATION     denies any chest pain in the past 2 years   COLONOSCOPY     colonoscopy polyps     CORONARY STENT INTERVENTION N/A 11/28/2021   Procedure: CORONARY STENT INTERVENTION;  Surgeon: JMartinique Peter M, MD;  [facility];  Service: Cardiovascular;  Laterality: N/A;   ESOPHAGOGASTRODUODENOSCOPY N/A 03/27/2013   Procedure: ESOPHAGOGASTRODUODENOSCOPY (EGD);  Surgeon: JIrene Shipper MD;  [facility];  Service: Endoscopy;  Laterality: N/A;   ESOPHAGOGASTRODUODENOSCOPY (EGD) WITH PROPOFOL N/A 07/06/2018   Procedure: ESOPHAGOGASTRODUODENOSCOPY (EGD) WITH PROPOFOL;  Surgeon: PJerene Bears MD;  [facility];  Service: Gastroenterology;  Laterality: N/A;   GASTRIC BYPASS     HERNIA REPAIR     HOT HEMOSTASIS N/A 07/06/2018   Procedure: HOT HEMOSTASIS (ARGON PLASMA COAGULATION/BICAP);  Surgeon: PJerene Bears MD;  [facility];  Service: Gastroenterology;  Laterality: N/A;   KNEE ARTHROSCOPY Left 11/06/2010   Left, torn meniscus (repaired)   LEFT HEART CATH AND CORONARY ANGIOGRAPHY N/A 11/28/2021   Procedure: LEFT HEART CATH AND CORONARY ANGIOGRAPHY;  Surgeon: JMartinique Peter M, MD;  [facility];  Service: Cardiovascular;  Laterality: N/A;   LEFT HEART CATHETERIZATION WITH CORONARY ANGIOGRAM N/A 12/23/2011   Procedure: LEFT HEART CATHETERIZATION WITH CORONARY ANGIOGRAM;  Surgeon: JMinus Breeding MD;  [facility];  Service: Cardiovascular;  Laterality: N/A;   REPLACEMENT TOTAL KNEE Right 01/2016   removed scar tissue/ cut tip of nerve bundle and re-route   right knee arthroscopy Right 07/05/14   Dr. AHart Robinsons GMeagher   ROTATOR CUFF REPAIR  2019   left   SCHLEROTHERAPY  07/06/2018   Procedure: SCHLEROTHERAPY;  Surgeon: PJerene Bears MD;  [facility];  Service: Gastroenterology;;   TONSILLECTOMY  age 66   TONSILLECTOMY     as a child   TOTAL KNEE ARTHROPLASTY Right 01/20/2016   Procedure: RIGHT TOTAL KNEE ARTHROPLASTY;  Surgeon: MParalee Cancel MD;  [facility];  Service: Orthopedics;  Laterality: Right;   TRANSURETHRAL RESECTION OF BLADDER TUMOR N/A 09/24/2020   Procedure: TRANSURETHRAL RESECTION OF BLADDER TUMOR (TURBT)  RIGHT retrograde pylegram;  Surgeon: EFestus Aloe MD;  [facility];  Service: Urology;  Laterality: N/A;   UPPER GI ENDOSCOPY  03/27/13   Family History  Problem Relation Age of Onset   Diabetes Mother    Hypertension Mother    Stroke Mother    Hyperlipidemia Mother    Hypertension Father    Colon polyps Father    Heart attack Father 6   Stroke Father    Heart attack Brother    Diabetes Brother    Heart disease Brother    Heart attack Brother        Multiple   Diabetes Brother    Diabetes Sister    Stroke Sister    Obesity Brother    Diabetes Brother    Heart disease Brother    Hypertension Maternal Grandmother    ADD / ADHD Daughter    Heart disease Brother    Stomach cancer Neg Hx    Colon cancer Neg Hx    Esophageal cancer Neg Hx    Rectal cancer Neg Hx    Social History   Socioeconomic History   Marital status: Married    Spouse name: Not on file   Number of children: 2   Years of education: Not on file   Highest education level: Not on file  Occupational History   Occupation: TEACHER    Employer: [employer]  Tobacco Use   Smoking status: Former    Packs/day: 1.50    Years: 20.00    Total pack years: 30.00    Types: Cigarettes    Quit date: 04/21/1991    Years since quitting: 30.6   Smokeless tobacco: Never  Vaping Use   Vaping Use: Never used  Substance and Sexual Activity   Alcohol use: Yes    Alcohol/week: 3.0 standard drinks of alcohol    Types: 3 Cans of beer per week    Comment: weekly   Drug use: No   Sexual activity: Not on file  Other Topics Concern   Not on file  Social History Narrative   Lives  with wife in [place].  Does not routinely exercise.   Social Determinants of Health   Financial Resource Strain: Low Risk  (12/01/2021)   Overall Financial Resource Strain (CARDIA)    Difficulty of Paying Living Expenses: Not hard at all  Food Insecurity: No Food Insecurity (12/01/2021)   Hunger Vital Sign    Worried About Running Out of Food in the Last Year: Never true    Ran Out of Food in the Last Year: Never true  Transportation Needs: No Transportation Needs (12/01/2021)   PRAPARE - Hydrologist (Medical): No    Lack of Transportation (Non-Medical): No  Physical Activity: Inactive (12/01/2021)   Exercise Vital Sign    Days of Exercise per Week: 0 days    Minutes of Exercise per Session: 0 min  Stress: No Stress Concern Present (12/01/2021)   [place]    Feeling of Stress : Not at all  Social Connections: [place] (12/01/2021)   Social Connection and Isolation Panel [NHANES]    Frequency of Communication with Friends and Family: More than three times a week    Frequency of Social Gatherings with Friends and Family: More than three times a week    Attends Religious Services: More than 4 times per year    Active Member of [employer] or Organizations: Yes    Attends Archivist Meetings: More than 4 times per year  Marital Status: Married    Tobacco Counseling Counseling given: Not Answered   Clinical Intake:  Pre-visit preparation completed: Yes  Pain : No/denies pain (has some chronic back pain)     BMI - recorded: 38.77 Nutritional Status: BMI > 30  Obese Nutritional Risks: None Diabetes: Yes CBG done?: No Did pt. bring in CBG monitor from home?: No (phone visit)  How often do you need to have someone help you when you read instructions, pamphlets, or other written materials from your doctor or pharmacy?: 1 - Never  Diabetes:  Is the patient  diabetic?  Yes  If diabetic, was a CBG obtained today?  No  Did the patient bring in their glucometer from home?  No phone visit How often do you monitor your CBG's? daily.   Financial Strains and Diabetes Management:  Are you having any financial strains with the device, your supplies or your medication? No .  Does the patient want to be seen by Chronic Care Management for management of their diabetes?  No  Would the patient like to be referred to a Nutritionist or for Diabetic Management?  No   Diabetic Exams:  Diabetic Eye Exam: Completed 01/01/2021.  Diabetic Foot Exam: Pt has been advised about the importance in completing this exam.To be completed by PCP   Interpreter Needed?: No  Information entered by :: Caroleen Hamman LPN   Activities of Daily Living    12/01/2021   12:54 PM 11/30/2021    4:02 PM  In your present state of health, do you have any difficulty performing the following activities:  Hearing? 0 0  Vision? 0 0  Difficulty concentrating or making decisions? 0 0  Walking or climbing stairs? 1 1  Comment sometimes stairs   Dressing or bathing? 0 0  Doing errands, shopping? 0 0  Preparing Food and eating ? N N  Using the Toilet? N N  In the past six months, have you accidently leaked urine? Y Y  Comment occasionally   Do you have problems with loss of bowel control? N N  Managing your Medications? N N  Managing your Finances? N N  Housekeeping or managing your Housekeeping? N N    Patient Care Team: [name], MD as PCP - General (Family Medicine) Jerline Pain, MD as PCP - Cardiology (Cardiology) Rana Snare, MD (Inactive) as Consulting Physician (Urology) Irene Shipper, MD as Consulting Physician (Gastroenterology) Rigoberto Noel, MD as Consulting Physician (Pulmonary Disease) Larey Dresser, MD as Consulting Physician (Cardiology) Paralee Cancel, MD as Consulting Physician (Orthopedic Surgery) [place] Susa Day, MD as Consulting Physician (Orthopedic Surgery)  Indicate any recent Medical Services you may have received from other than [facility] providers in the past year (date may be approximate).     Assessment:   This is a routine wellness examination for Damieon.  Hearing/Vision screen Hearing Screening - Comments:: C/o some hearing loss.No hearing aidsi Vision Screening - Comments:: Last eye exam-10/2021-Dr. Bart  Dietary issues and exercise activities discussed: Current Exercise Habits: The patient does not participate in regular exercise at present, Exercise limited by: cardiac condition(s)   Goals Addressed             This Visit's Progress    Patient Stated       Increase activity as tolerated       Depression Screen    12/01/2021   12:54 PM 10/06/2021   10:25 AM 06/18/2020   11:09 AM 06/09/2019  11:34 AM 03/01/2018    6:41 PM 11/17/2016    9:24 AM  PHQ 2/9 Scores  PHQ - 2 Score 0 0 0 2 0 0  PHQ- 9 Score    6 7     Fall Risk    12/01/2021   12:52 PM 11/30/2021    4:02 PM 10/06/2021   10:24 AM 06/18/2020   11:31 AM 06/09/2019   11:30 AM  Fall Risk   Falls in the past year? 1 1 0 0 1  Number falls in past yr: 0 0 0 0 0  Injury with Fall? 0 0 0 0 0  Follow up Falls prevention discussed        FALL RISK PREVENTION PERTAINING TO THE HOME:  Any stairs in or around the home? Yes  If so, are there any without handrails? No  Home free of loose throw rugs in walkways, pet beds, electrical cords, etc? Yes  Adequate lighting in your home to reduce risk of falls? Yes   ASSISTIVE DEVICES UTILIZED TO PREVENT FALLS:  Life alert? No  Use of a cane, walker or w/c? Yes cane occasionally Grab bars in the bathroom? No  Shower chair or bench in shower? No  Elevated toilet seat or a handicapped toilet? No   TIMED UP AND GO:  Was the test performed? No . Phone visit   Cognitive Function:Normal cognitive status assessed by this Nurse Health Advisor. No abnormalities found.           Immunizations Immunization History  Administered Date(s) Administered   Fluad Quad(high Dose 65+) 01/14/2021   Influenza Split 02/11/2011, 02/02/2012   Influenza Whole 01/18/2010, 03/04/2013   Influenza,inj,Quad PF,6+ Mos 05/08/2014, 04/02/2015, 01/25/2018, 01/20/2019, 01/11/2020   Influenza,inj,quad, With Preservative 01/18/2017   Janssen (J&J) SARS-COV-2 Vaccination 07/01/2019, 03/18/2020, 03/19/2021   PNEUMOCOCCAL CONJUGATE-20 01/14/2021   Pneumococcal Conjugate-13 05/08/2014   Pneumococcal Polysaccharide-23 05/27/2015   Td 04/20/1996, 10/29/2008   Tdap 03/01/2012   Zoster Recombinat (Shingrix) 08/12/2017, 10/12/2017   Zoster, Live 12/02/2015    TDAP status: Up to date  Flu Vaccine status: Up to date  Pneumococcal vaccine status: Up to date  Covid-19 vaccine status: Completed vaccines  Qualifies for Shingles Vaccine? No   Zostavax completed Yes   Shingrix Completed?: Yes  Screening Tests Health Maintenance  Topic Date Due   FOOT EXAM  11/15/2015   HEMOGLOBIN A1C  03/26/2021   INFLUENZA VACCINE  11/18/2021   OPHTHALMOLOGY EXAM  01/01/2022   TETANUS/TDAP  03/01/2022   COLONOSCOPY (Pts 45-47yr Insurance coverage will need to be confirmed)  09/02/2026   Pneumonia Vaccine 66 Years old  Completed   Hepatitis C Screening  Completed   Zoster Vaccines- Shingrix  Completed   HPV VACCINES  Aged Out   COVID-19 Vaccine  Discontinued    Health Maintenance  Health Maintenance Due  Topic Date Due   FOOT EXAM  11/15/2015   HEMOGLOBIN A1C  03/26/2021   INFLUENZA VACCINE  11/18/2021    Colorectal cancer screening: Type of screening: Colonoscopy. Completed 09/01/2021. Repeat every 5 years  Lung Cancer Screening: (Low Dose CT Chest recommended if Age 66-80years, 30 pack-year currently smoking OR have quit w/in 15years.) does not qualify.     Additional Screening:  Hepatitis C Screening: Completed 05/27/2015  Vision Screening: Recommended annual  ophthalmology exams for early detection of glaucoma and other disorders of the eye. Is the patient up to date with their annual eye exam?  Yes  Who is the provider or  what is the name of the office in which the patient attends annual eye exams? Dr. [name]   Dental Screening: Recommended annual dental exams for proper oral hygiene  Community Resource Referral / Chronic Care Management: CRR required this visit?  No   CCM required this visit?  No      Plan:     I have personally reviewed and noted the following in the patient's chart:   Medical and social history Use of alcohol, tobacco or illicit drugs  Current medications and supplements including opioid prescriptions. Patient is currently taking opioid prescriptions. Information provided to patient regarding non-opioid alternatives. Patient advised to discuss non-opioid treatment plan with their provider. Functional ability and status Nutritional status Physical activity Advanced directives List of other physicians Hospitalizations, surgeries, and ER visits in previous 12 months Vitals Screenings to include cognitive, depression, and falls Referrals and appointments  In addition, I have reviewed and discussed with patient certain preventive protocols, quality metrics, and best practice recommendations. A written personalized care plan for preventive services as well as general preventive health recommendations were provided to patient.   Due to this being a telephonic visit, the after visit summary with patients personalized plan was offered to patient via mail or my-chart. Patient would like to access on my-chart.   Marta Antu, LPN   [id number]/50/[id number]  Nurse Health Advisor  Nurse Notes: None

## 2021-12-01 NOTE — Patient Instructions (Signed)
Brent [name] , Thank you for taking time to complete your Medicare Wellness Visit. I appreciate your ongoing commitment to your health goals. Please review the following plan we discussed and let me know if I can assist you in the future.   Screening recommendations/referrals: Colonoscopy: Completed 09/01/2021-Due 09/02/2026 Recommended yearly ophthalmology/optometry visit for glaucoma screening and checkup Recommended yearly dental visit for hygiene and checkup  Vaccinations: Influenza vaccine: Up to date Pneumococcal vaccine: Up to date Tdap vaccine: Up to date Shingles vaccine: Up to date   Covid-19: Up to date  Advanced directives: Please bring a copy of Living Will and/or Healthcare Power of Attorney for your chart.   Conditions/risks identified: See problem list  Next appointment: Follow up in one year for your annual wellness visit.  Preventive Care 66 Years and Older, Male Preventive care refers to lifestyle choices and visits with your health care provider that can promote health and wellness. What does preventive care include? A yearly physical exam. This is also called an annual well check. Dental exams once or twice a year. Routine eye exams. Ask your health care provider how often you should have your eyes checked. Personal lifestyle choices, including: Daily care of your teeth and gums. Regular physical activity. Eating a healthy diet. Avoiding tobacco and drug use. Limiting alcohol use. Practicing safe sex. Taking low doses of aspirin every day. Taking vitamin and mineral supplements as recommended by your health care provider. What happens during an annual well check? The services and screenings done by your health care provider during your annual well check will depend on your age, overall health, lifestyle risk factors, and family history of disease. Counseling  Your health care provider may ask you questions about your: Alcohol use. Tobacco use. Drug  use. Emotional well-being. Home and relationship well-being. Sexual activity. Eating habits. History of falls. Memory and ability to understand (cognition). Work and work Statistician. Screening  You may have the following tests or measurements: Height, weight, and BMI. Blood pressure. Lipid and cholesterol levels. These may be checked every 5 years, or more frequently if you are over 17 years old. Skin check. Lung cancer screening. You may have this screening every year starting at age 66 if you have a 30-pack-year history of smoking and currently smoke or have quit within the past 15 years. Fecal occult blood test (FOBT) of the stool. You may have this test every year starting at age 66. Flexible sigmoidoscopy or colonoscopy. You may have a sigmoidoscopy every 5 years or a colonoscopy every 10 years starting at age 66. Prostate cancer screening. Recommendations will vary depending on your family history and other risks. Hepatitis C blood test. Hepatitis B blood test. Sexually transmitted disease (STD) testing. Diabetes screening. This is done by checking your blood sugar (glucose) after you have not eaten for a while (fasting). You may have this done every 1-3 years. Abdominal aortic aneurysm (AAA) screening. You may need this if you are a current or former smoker. Osteoporosis. You may be screened starting at age 101 if you are at high risk. Talk with your health care provider about your test results, treatment options, and if necessary, the need for more tests. Vaccines  Your health care provider may recommend certain vaccines, such as: Influenza vaccine. This is recommended every year. Tetanus, diphtheria, and acellular pertussis (Tdap, Td) vaccine. You may need a Td booster every 10 years. Zoster vaccine. You may need this after age 27. Pneumococcal 13-valent conjugate (PCV13) vaccine. One dose is  recommended after age 66. Pneumococcal polysaccharide (PPSV23) vaccine. One dose is  recommended after age 66. Talk to your health care provider about which screenings and vaccines you need and how often you need them. This information is not intended to replace advice given to you by your health care provider. Make sure you discuss any questions you have with your health care provider. Document Released: 05/03/2015 Document Revised: 12/25/2015 Document Reviewed: 02/05/2015 Elsevier Interactive Patient Education  2017 [place] Prevention in the Home Falls can cause injuries. They can happen to people of all ages. There are many things you can do to make your home safe and to help prevent falls. What can I do on the outside of my home? Regularly fix the edges of walkways and driveways and fix any cracks. Remove anything that might make you trip as you walk through a door, such as a raised step or threshold. Trim any bushes or trees on the path to your home. Use bright outdoor lighting. Clear any walking paths of anything that might make someone trip, such as rocks or tools. Regularly check to see if handrails are loose or broken. Make sure that both sides of any steps have handrails. Any raised decks and porches should have guardrails on the edges. Have any leaves, snow, or ice cleared regularly. Use sand or salt on walking paths during winter. Clean up any spills in your garage right away. This includes oil or grease spills. What can I do in the bathroom? Use night lights. Install grab bars by the toilet and in the tub and shower. Do not use towel bars as grab bars. Use non-skid mats or decals in the tub or shower. If you need to sit down in the shower, use a plastic, non-slip stool. Keep the floor dry. Clean up any water that spills on the floor as soon as it happens. Remove soap buildup in the tub or shower regularly. Attach bath mats securely with double-sided non-slip rug tape. Do not have throw rugs and other things on the floor that can make you  trip. What can I do in the bedroom? Use night lights. Make sure that you have a light by your bed that is easy to reach. Do not use any sheets or blankets that are too big for your bed. They should not hang down onto the floor. Have a firm chair that has side arms. You can use this for support while you get dressed. Do not have throw rugs and other things on the floor that can make you trip. What can I do in the kitchen? Clean up any spills right away. Avoid walking on wet floors. Keep items that you use a lot in easy-to-reach places. If you need to reach something above you, use a strong step stool that has a grab bar. Keep electrical cords out of the way. Do not use floor polish or wax that makes floors slippery. If you must use wax, use non-skid floor wax. Do not have throw rugs and other things on the floor that can make you trip. What can I do with my stairs? Do not leave any items on the stairs. Make sure that there are handrails on both sides of the stairs and use them. Fix handrails that are broken or loose. Make sure that handrails are as long as the stairways. Check any carpeting to make sure that it is firmly attached to the stairs. Fix any carpet that is loose or worn. Avoid having  throw rugs at the top or bottom of the stairs. If you do have throw rugs, attach them to the floor with carpet tape. Make sure that you have a light switch at the top of the stairs and the bottom of the stairs. If you do not have them, ask someone to add them for you. What else can I do to help prevent falls? Wear shoes that: Do not have high heels. Have rubber bottoms. Are comfortable and fit you well. Are closed at the toe. Do not wear sandals. If you use a stepladder: Make sure that it is fully opened. Do not climb a closed stepladder. Make sure that both sides of the stepladder are locked into place. Ask someone to hold it for you, if possible. Clearly mark and make sure that you can  see: Any grab bars or handrails. First and last steps. Where the edge of each step is. Use tools that help you move around (mobility aids) if they are needed. These include: Canes. Walkers. Scooters. Crutches. Turn on the lights when you go into a dark area. Replace any light bulbs as soon as they burn out. Set up your furniture so you have a clear path. Avoid moving your furniture around. If any of your floors are uneven, fix them. If there are any pets around you, be aware of where they are. Review your medicines with your doctor. Some medicines can make you feel dizzy. This can increase your chance of falling. Ask your doctor what other things that you can do to help prevent falls. This information is not intended to replace advice given to you by your health care provider. Make sure you discuss any questions you have with your health care provider. Document Released: 01/31/2009 Document Revised: 09/12/2015 Document Reviewed: 05/11/2014 Elsevier Interactive Patient Education  2017 [employer].

## 2021-12-04 ENCOUNTER — Ambulatory Visit (INDEPENDENT_AMBULATORY_CARE_PROVIDER_SITE_OTHER): Payer: Medicare Other | Admitting: Psychology

## 2021-12-04 DIAGNOSIS — R454 Irritability and anger: Secondary | ICD-10-CM | POA: Diagnosis not present

## 2021-12-04 NOTE — Progress Notes (Signed)
[employer] Counselor Initial Adult Exam  Name: Brent [name] Date: 12/04/2021 MRN: [id number] DOB: [birth year] PCP: [name], MD  Time Spent: 4:05  pm - 5:00 pm: 55 Minutes   Estella Husk [place] participated from home, via video, and consented to treatment. Therapist participated from home office. We met online due to [place] pandemic.   Guardian/Payee:  N/A    Paperwork requested: No   Reason for Visit /Presenting Problem: anger, stress, agitation  Mental Status Exam: Appearance:   Casual     Behavior:  Appropriate  Motor:  Normal  Speech/Language:   Normal Rate  Affect:  Appropriate  Mood:  normal  Thought process:  normal  Thought content:    WNL  Sensory/Perceptual disturbances:    WNL  Orientation:  oriented to person, place, and situation  Attention:  Good  Concentration:  Good  Memory:  WNL  Fund of knowledge:   Good  Insight:    Good  Judgment:   Good  Impulse Control:  Good     Reported Symptoms:  short temper, agitation  Risk Assessment: Danger to Self:  No Self-injurious Behavior: No Danger to Others: No Duty to Warn:no Physical Aggression / Violence:No  Access to Firearms a concern:  unknown Gang Involvement:No  Patient / guardian was educated about steps to take if suicide or homicide risk level increases between visits: yes While future psychiatric events cannot be accurately predicted, the patient does not currently require acute inpatient psychiatric care and does not currently meet [facility] involuntary commitment criteria.  Substance Abuse History: Current substance abuse: No     Past Psychiatric History:   No previous psychological problems have been observed Outpatient Providers:N/A History of Psych Hospitalization: No  Psychological Testing:  N/A    Abuse History:  Victim of: No.,  N/A    Report needed: No. Victim of Neglect:No. Perpetrator of  N/A   Witness / Exposure to  Domestic Violence: No   Protective Services Involvement: No  Witness to [employer] Violence:  No   Family History:  Family History  Problem Relation Age of Onset   Diabetes Mother    Hypertension Mother    Stroke Mother    Hyperlipidemia Mother    Hypertension Father    Colon polyps Father    Heart attack Father 8   Stroke Father    Heart attack Brother    Diabetes Brother    Heart disease Brother    Heart attack Brother        Multiple   Diabetes Brother    Diabetes Sister    Stroke Sister    Obesity Brother    Diabetes Brother    Heart disease Brother    Hypertension Maternal Grandmother    ADD / ADHD Daughter    Heart disease Brother    Stomach cancer Neg Hx    Colon cancer Neg Hx    Esophageal cancer Neg Hx    Rectal cancer Neg Hx     Living situation: the patient lives with their spouse  Sexual Orientation: Straight  Relationship Status: married  Name of spouse / other:Margaret "Helene Kelp" If a parent, number of children / ages:two daughters 21 and 91  Support Systems: spouse  Museum/gallery curator Stress:  No   Income/Employment/Disability: Financial trader: Yes   Educational History: Education: Museum/gallery exhibitions officer: unknown  Any cultural differences  that may affect / interfere with treatment:  not applicable   Recreation/Hobbies: hunting  Stressors: Other: medical issues    Strengths: Supportive Relationships  Barriers:  unknown   Legal History: Pending legal issue / charges: The patient has no significant history of legal issues. History of legal issue / charges:  N/A  Medical History/Surgical History: reviewed Past Medical History:  Diagnosis Date   Acid reflux disease    ACID REFLUX DISEASE 07/05/2007   Acute gastric ulcer with bleeding    Alcohol abuse    Anemia    Atherosclerosis    Benign neoplasm of colon 07/05/2007   Bilateral hip pain 10/05/2016   Bladder cancer ([place])    Breast pain, left  11/17/2011   CAD (coronary artery disease)    Chest pain    a. Reportedly negative dobut echo performed prior to gastric bypass in 03/2011;  b. 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 12/2011 Cath: LM nl, LAD 50p, D1 56m LCX min irregs, OM3 30, RCA 25p, 762mFFR 0.99->0.89), PDA 30, EF 65%, Med Rx.   COLONIC POLYPS, HX OF 10/29/2008   Diarrhea 06/13/2010   Diverticulosis 07/05/2018   DIVERTICULOSIS, COLON 10/29/2008   DM (diabetes mellitus), type 2, uncontrolled    GI bleed    Gout 03/04/2013   Hearing loss 05/24/2013   Previous audiology evaluation completely.   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   Neck pain 03/2015   Other and unspecified hyperlipidemia 11/16/2012   Post-operative nausea and vomiting    after the surgery in hospital in March 2020/ past the cauderization   Preventative health care 11/17/2011   Renal stone 11/2013   Sleep apnea    a. CPAP   Spinal stenosis    Tear of meniscus of left knee 2012    Past Surgical History:  Procedure Laterality Date   ANKLE SURGERY Right 1994   BICEPS TENDON REPAIR     left side   CARDIAC CATHETERIZATION     denies any chest pain in the past 2 years   COLONOSCOPY     colonoscopy polyps     CORONARY STENT INTERVENTION N/A 11/28/2021   Procedure: CORONARY STENT INTERVENTION;  Surgeon: JoMartiniquePeter M, MD;  [facility];  Service: Cardiovascular;  Laterality: N/A;   ESOPHAGOGASTRODUODENOSCOPY N/A 03/27/2013   Procedure: ESOPHAGOGASTRODUODENOSCOPY (EGD);  Surgeon: JoIrene ShipperMD;  [facility];  Service: Endoscopy;  Laterality: N/A;   ESOPHAGOGASTRODUODENOSCOPY (EGD) WITH PROPOFOL N/A 07/06/2018   Procedure: ESOPHAGOGASTRODUODENOSCOPY (EGD) WITH  PROPOFOL;  Surgeon: PyJerene BearsMD;  [facility];  Service: Gastroenterology;  Laterality: N/A;   GASTRIC BYPASS     HERNIA REPAIR     HOT HEMOSTASIS N/A 07/06/2018   Procedure: HOT HEMOSTASIS (ARGON PLASMA COAGULATION/BICAP);  Surgeon: PyJerene BearsMD;  [facility];  Service: Gastroenterology;  Laterality: N/A;   KNEE ARTHROSCOPY Left 11/06/2010   Left, torn meniscus (repaired)   LEFT HEART CATH AND CORONARY ANGIOGRAPHY N/A 11/28/2021   Procedure: LEFT HEART CATH AND CORONARY ANGIOGRAPHY;  Surgeon: JoMartiniquePeter M, MD;  [facility];  Service: Cardiovascular;  Laterality: N/A;   LEFT HEART CATHETERIZATION WITH CORONARY ANGIOGRAM N/A 12/23/2011  Procedure: LEFT HEART CATHETERIZATION WITH CORONARY ANGIOGRAM;  Surgeon: Minus Breeding, MD;  Location: [facility];  Service: Cardiovascular;  Laterality: N/A;   REPLACEMENT TOTAL KNEE Right 01/2016   removed scar tissue/ cut tip of nerve bundle and re-route   right knee arthroscopy Right 07/05/14   Dr. Hart Robinsons, [place].   ROTATOR CUFF REPAIR  2019   left   SCHLEROTHERAPY  07/06/2018   Procedure: SCHLEROTHERAPY;  Surgeon: Jerene Bears, MD;  [facility];  Service: Gastroenterology;;   TONSILLECTOMY  age 71   TONSILLECTOMY     as a child   TOTAL KNEE ARTHROPLASTY Right 01/20/2016   Procedure: RIGHT TOTAL KNEE ARTHROPLASTY;  Surgeon: Paralee Cancel, MD;  [facility];  Service: Orthopedics;  Laterality: Right;   TRANSURETHRAL RESECTION OF BLADDER TUMOR N/A 09/24/2020   Procedure: TRANSURETHRAL RESECTION OF BLADDER TUMOR (TURBT)  RIGHT retrograde pylegram;  Surgeon: [name], MD;  [facility];  Service: Urology;  Laterality: N/A;   UPPER GI ENDOSCOPY  03/27/13    Medications: Current Outpatient Medications  Medication Sig Dispense Refill   acetaminophen (TYLENOL) 500 MG tablet Take 1,000 mg by mouth every 6 (six) hours as needed for moderate pain or headache.     amitriptyline  (ELAVIL) 50 MG tablet TAKE 1 TABLET AT BEDTIME 90 tablet 3   amoxicillin (AMOXIL) 500 MG capsule Take 2,000 mg by mouth See admin instructions.  TAKE 4 CAPSULES BY MOUTH PRIOR TO APPOINTMENT     aspirin EC 81 MG tablet Take 1 tablet (81 mg total) by mouth daily. Swallow whole. 90 tablet 3   Cholecalciferol (VITAMIN D) 50 MCG (2000 UT) tablet Take 6,000 Units by mouth daily.     clopidogrel (PLAVIX) 75 MG tablet Take 1 tablet (75 mg total) by mouth daily with breakfast. 90 tablet 2   Cyanocobalamin (VITAMIN B-12) 1000 MCG SUBL Place 1,000 mcg under the tongue 2 (two) times a week.     Dulaglutide (TRULICITY) 1.5 SE/8.3TD SOPN Inject 1.5 mg into the skin every Friday. Trulicity 1.5 VV/6.1 mL subcutaneous pen injector     econazole nitrate 1 % cream Apply topically daily. (Patient taking differently: Apply 1 Application topically daily as needed (fungus).) 30 g 1   EPINEPHrine 0.3 mg/0.3 mL IJ SOAJ injection INJECT 0.3 MLS (0.3 MG TOTAL) INTO THE MUSCLE ONCE FOR 1 DOSE 2 each 11   famotidine (PEPCID) 40 MG tablet TAKE 1 TABLET AT BEDTIME AS NEEDED FOR HEARTBURN OR INDIGESTION 90 tablet 3   fenofibrate 160 MG tablet TAKE 1 TABLET DAILY 90 tablet 3   Ferrous Gluconate-C-Folic Acid (IRON-C PO) Take 1 tablet by mouth daily. Iron 65 mg and vit C-125 mg     fluticasone (FLONASE) 50 MCG/ACT nasal spray Place 2 sprays into both nostrils daily. (Patient taking differently: Place 1 spray into both nostrils 2 (two) times daily as needed for allergies.) 48 g 1   gabapentin (NEURONTIN) 300 MG capsule TAKE 1 CAPSULE TWICE A DAY AND 2 CAPSULES AT BEDTIME (Patient taking differently: Take 300-900 mg by mouth See admin instructions. TAKE 1 CAPSULE TWICE A DAY (Morning and Lunch) AND 3 CAPSULES IN THE EVENING AND AT BEDTIME) 360 capsule 1   glucose blood (FREESTYLE LITE) test strip Use to check blood sugar 4 times day.  Dx Code: E11.9 400 each 1   glyBURIDE (DIABETA) 5 MG tablet Take 1 tablet (5 mg total) by mouth daily.  (Patient taking differently: Take 2.5 mg by mouth daily.)  90 tablet 1   hydrochlorothiazide (MICROZIDE) 12.5 MG capsule Take 1 capsule (12.5 mg total) by mouth daily. 90 capsule 1   insulin lispro (HUMALOG) 100 UNIT/ML KwikPen Inject 2-6 Units into the skin 3 (three) times daily. Per sliding scale. (Patient taking differently: Inject 2-6 Units into the skin 3 (three) times daily as needed (high blood sugar). Per sliding scale.) 15 mL 0   Insulin Pen Needle (PEN NEEDLES 31GX5/16") 31G X 8 MM MISC Use as directed with Humalog 3 times a day and at bedtime 100 each 0   losartan (COZAAR) 50 MG tablet TAKE 1 TABLET DAILY (KEEP UPCOMING APPOINTMENT IN [facility] 2023 WITH CARDIOLOGIST BEFORE ANYMORE REFILLS) 90 tablet 3   Magnesium 100 MG CAPS Take 1 capsule (100 mg total) by mouth daily. 30 capsule 1   metoprolol tartrate (LOPRESSOR) 100 MG tablet Take 1 tablet (100 mg total) by mouth 2 (two) times daily. 180 tablet 3   mometasone (ELOCON) 0.1 % cream Apply 1 Application topically daily. (Patient taking differently: Apply 1 Application topically daily as needed (fungus).) 90 g 1   Multiple Vitamin (MULTIVITAMIN) tablet Take 1 tablet by mouth daily.     nitroGLYCERIN (NITROSTAT) 0.4 MG SL tablet Place 1 tablet (0.4 mg total) under the tongue every 5 (five) minutes as needed. 25 tablet 2   oxycodone (OXY-IR) 5 MG capsule Take 5 mg by mouth 3 (three) times daily as needed for pain.     pantoprazole (PROTONIX) 40 MG tablet TAKE 1 TABLET TWICE A DAY BEFORE MEALS 180 tablet 3   Pitavastatin Calcium (LIVALO) 2 MG TABS Take 1 tablet (2 mg total) by mouth daily. 90 tablet 3   sodium chloride (OCEAN) 0.65 % SOLN nasal spray Place 1 spray into both nostrils as needed for congestion (nose irritation). 30 mL 0   No current facility-administered medications for this visit.    Allergies  Allergen Reactions   Bee Venom Anaphylaxis   Lipitor [Atorvastatin] Other (See Comments)    Myalgias, memory changes   Morphine Other  (See Comments)    hyperactive   Metformin And Related Other (See Comments)    Myalgias and weakness   Robaxin [Methocarbamol]     GI bleed    Baclofen     Acted drunk   Medrol [Methylprednisolone]     Hyperglycemia    Simvastatin     Joint pain   Hydrocodone Itching   Pravastatin Other (See Comments)    Made joints  Initial session note:  Patient reports that he had an eval before having a back stimulator implanted. Was told he had "signs of PTSD" and anger. He said he was in the [employer] for 20 years and retired from there in 95. 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 That eval was last May and was referred to me by Dr. Charlett Blake. He says that his wife has told him for years that he can be a jerk at times. He has been married 11 years. They have a 21 and 40 year old daughters that lives in [place] and the other close by in [place]. The oldest is married and has a 66 year old. 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 He has had numerous medical issues, which led to his retirement. His wif retired then went back to work part time Astronomer. He states he has periods of being short tempered. He never has been violent, but gets easily irritated with other (eg. When driving).  In the [employer], he was taught to act many steps ahead. That is why he says he gets so frustrated with people for not planning ahead. Things do not roll off of him very easily. He says he is not sure about the PTSD symptoms that the other evaluator identified. 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  He says that he has had anger issues for at least the past 35-40 years. Says it started almost 40 years ago when he went to [place]. Says he had a good childhood and good relationship with his parents. He grew up in [place] and parents died in 28 and May 29, 2022. He has 4 brothers and 1 sister. Two older brothers are alive and  others are deceased. Lost sister to Covid 22, a brother to a heart attack and a brother to agent orange.  States that he has had a good life. No history of mental health problems in family. He has never been diagnosed with any mental health condition. No history of substance abuse in family. He has backed off of some of his volunteer activity because it was causing stress. The stress was due to some of the other people involved. He like to hunt, fish and go shooting. Likes spending time with his grandson. He states that he and his wife have a good relationship that has improved with their retirement. Has history of sleep disturbance, but is now on meds and it is improved. Had a recent cardiac stint and he is no longer fatigued all the time.  Suggested that he keep a journal of episodes of anger/agitation to discuss at next session.      Diagnoses:  Irritability and anger  Plan of Care: outpatient psychotherapy/possible medication   Marcelina Morel, PhD

## 2021-12-05 DIAGNOSIS — G4730 Sleep apnea, unspecified: Secondary | ICD-10-CM | POA: Diagnosis not present

## 2021-12-05 DIAGNOSIS — G4733 Obstructive sleep apnea (adult) (pediatric): Secondary | ICD-10-CM | POA: Diagnosis not present

## 2021-12-08 ENCOUNTER — Ambulatory Visit (HOSPITAL_COMMUNITY): Payer: Medicare Other | Attending: Cardiovascular Disease

## 2021-12-08 ENCOUNTER — Other Ambulatory Visit: Payer: Medicare Other

## 2021-12-08 DIAGNOSIS — R0609 Other forms of dyspnea: Secondary | ICD-10-CM

## 2021-12-08 DIAGNOSIS — R0989 Other specified symptoms and signs involving the circulatory and respiratory systems: Secondary | ICD-10-CM | POA: Diagnosis not present

## 2021-12-08 DIAGNOSIS — R0600 Dyspnea, unspecified: Secondary | ICD-10-CM | POA: Insufficient documentation

## 2021-12-08 LAB — BASIC METABOLIC PANEL
BUN/Creatinine Ratio: 17 (ref 10–24)
BUN: 21 mg/dL (ref 8–27)
CO2: 22 mmol/L (ref 20–29)
Calcium: 9.3 mg/dL (ref 8.6–10.2)
Chloride: 104 mmol/L (ref 96–106)
Creatinine, Ser: 1.24 mg/dL (ref 0.76–1.27)
Glucose: 168 mg/dL — ABNORMAL HIGH (ref 70–99)
Potassium: 4.5 mmol/L (ref 3.5–5.2)
Sodium: 139 mmol/L (ref 134–144)
eGFR: 64 mL/min/{1.73_m2} (ref >59)

## 2021-12-08 LAB — ECHOCARDIOGRAM COMPLETE
Area-P 1/2: 4.57 cm2
P 1/2 time: 684 msec
S' Lateral: 3.3 cm

## 2021-12-08 MED ORDER — PERFLUTREN LIPID MICROSPHERE
1.0000 mL | INTRAVENOUS | Status: AC | PRN
  Administered 2021-12-08: 2 mL via INTRAVENOUS

## 2021-12-09 ENCOUNTER — Ambulatory Visit (INDEPENDENT_AMBULATORY_CARE_PROVIDER_SITE_OTHER): Payer: Medicare Other | Admitting: Family Medicine

## 2021-12-09 DIAGNOSIS — I10 Essential (primary) hypertension: Secondary | ICD-10-CM | POA: Diagnosis not present

## 2021-12-09 DIAGNOSIS — I2510 Atherosclerotic heart disease of native coronary artery without angina pectoris: Secondary | ICD-10-CM | POA: Diagnosis not present

## 2021-12-09 DIAGNOSIS — E538 Deficiency of other specified B group vitamins: Secondary | ICD-10-CM | POA: Diagnosis not present

## 2021-12-09 DIAGNOSIS — E559 Vitamin D deficiency, unspecified: Secondary | ICD-10-CM

## 2021-12-09 DIAGNOSIS — Z9989 Dependence on other enabling machines and devices: Secondary | ICD-10-CM

## 2021-12-09 DIAGNOSIS — G4733 Obstructive sleep apnea (adult) (pediatric): Secondary | ICD-10-CM

## 2021-12-09 DIAGNOSIS — M1A9XX0 Chronic gout, unspecified, without tophus (tophi): Secondary | ICD-10-CM

## 2021-12-09 DIAGNOSIS — M545 Low back pain, unspecified: Secondary | ICD-10-CM

## 2021-12-09 DIAGNOSIS — E782 Mixed hyperlipidemia: Secondary | ICD-10-CM

## 2021-12-09 DIAGNOSIS — E1100 Type 2 diabetes mellitus with hyperosmolarity without nonketotic hyperglycemic-hyperosmolar coma (NKHHC): Secondary | ICD-10-CM | POA: Diagnosis not present

## 2021-12-09 DIAGNOSIS — D500 Iron deficiency anemia secondary to blood loss (chronic): Secondary | ICD-10-CM | POA: Diagnosis not present

## 2021-12-09 NOTE — Patient Instructions (Addendum)
RSV vaccine (respiratory Syncitial virus)   Coronary Artery Disease, Male Coronary artery disease (CAD) is a condition in which the arteries that lead to the heart (coronary arteries) become narrow or blocked. The narrowing or blockage can lead to decreased blood flow to the heart. Prolonged reduced blood flow can cause a heart attack (myocardial infarction, or MI). This condition may also be called coronary heart disease. CAD is the most common type of heart disease, and heart disease is the leading cause of death in men. It is important to understand what causes CAD and how it is treated. What are the causes? CAD is most often caused by atherosclerosis. This is the buildup of fat and cholesterol (plaque) on the inside of the arteries. Over time, the plaque may narrow or block the artery, reducing blood flow to the heart. Plaque can also become weak and break off within a coronary artery and cause a sudden blockage. Other less common causes of CAD include: A blood clot or a piece of another substance that blocks the flow of blood in a coronary artery (embolism). A tearing of the artery (spontaneous coronary artery dissection). An enlargement of an artery (aneurysm). Inflammation (vasculitis) in the artery wall. What increases the risk? The following factors may make you more likely to develop this condition: Age. Men older than 18 years are at a greater risk of CAD. Family history of CAD. High blood pressure (hypertension). Diabetes. High cholesterol levels. Obesity. Other risk factors include: Tobacco use. Excessive alcohol use. Lack of exercise. A diet high in saturated and trans fats, such as fried food and processed meat. What are the signs or symptoms? Many people do not have any symptoms during the early stages of CAD. As the condition progresses, symptoms may include: Chest pain (angina). The pain can: Feel like crushing or squeezing, or like a tightness, pressure, fullness, or  heaviness in the chest. Last more than a few minutes or can stop and recur. The pain tends to get worse with exercise or stress and to fade with rest. Pain in the arms, neck, jaw, ear, or back. Unexplained heartburn or indigestion. Shortness of breath. Nausea or vomiting. Sudden light-headedness. Sudden cold sweats. Fluttering or fast heartbeat (palpitations). How is this diagnosed? This condition is diagnosed based on: Your family and medical history. A physical exam. Tests. These may include: A test to check the electrical signals in your heart (electrocardiogram). Exercise stress test. This looks for signs of blockage when the heart is stressed with exercise, such as running on a treadmill. Pharmacologic stress test. This test looks for signs of blockage when the heart is being stressed with a medicine. Blood tests to check levels of cardiac enzymes such as troponin and creatine kinase. Coronary angiogram. This is a procedure to look at the coronary arteries to see if there is any blockage. During this test, a dye is injected into your arteries so they appear on an X-ray. Coronary artery CT scan. This scan helps detect calcium deposits in your coronary arteries. Calcium deposits are an indicator of CAD. A test that uses sound waves to take a picture of your heart (echocardiogram). How is this treated? This condition may be treated by: Healthy lifestyle changes to reduce risk factors. Medicines such as: Antiplatelet medicines such as clopidogrel or aspirin. These help to prevent blood clots. Nitroglycerin. Blood pressure medicines. Cholesterol-lowering medicine. Coronary angioplasty and stenting. During this procedure, a thin, flexible tube is inserted through a blood vessel and into a blocked artery.  A balloon or similar device on the end of the tube is inflated to open up the artery. In some cases, a small, mesh tube (stent) is inserted into the artery to keep it open. Coronary  artery bypass surgery. During this surgery, veins or arteries from other parts of the body are used to create a bypass around the blockage and allow blood to reach your heart. Follow these instructions at home: Medicines Take over-the-counter and prescription medicines only as told by your health care provider. Do not take the following medicines unless your health care provider approves: NSAIDs, such as ibuprofen, naproxen, or celecoxib. Vitamin supplements that contain vitamin A, vitamin E, or both. Lifestyle Follow an exercise program approved by your health care provider. Ask your health care provider if cardiac rehab is appropriate. Maintain a healthy weight or lose weight as approved by your health care provider. Learn to manage stress or try to limit your stress. Ask your health care provider for suggestions if you need help. Get screened for depression and seek treatment, if needed. Do not use any products that contain nicotine or tobacco. These products include cigarettes, chewing tobacco, and vaping devices, such as e-cigarettes. If you need help quitting, ask your health care provider. Eating and drinking  Follow a heart-healthy diet. A dietitian can help educate you about healthy food options and changes. In general, eat plenty of fruits and vegetables, lean meats, and whole grains. Avoid foods high in: Sugar. Salt (sodium). Saturated fat, such as processed or fatty meat. Trans fat, such as fried foods. Use healthy cooking methods such as roasting, grilling, broiling, baking, poaching, steaming, or stir-frying. Do not drink alcohol if your health care provider tells you not to drink. If you drink alcohol: Limit how much you have to 0-2 drinks a day. Know how much alcohol is in your drink. In the [place] one drink equals one 12 oz bottle of beer (355 mL), one 5 oz glass of wine (148 mL), or one 1 oz glass of hard liquor (44 mL). General instructions Manage any other health  conditions, such as high cholesterol, hypertension, and diabetes. These conditions affect your heart. Your health care provider may ask you to monitor your blood pressure. Keep all follow-up visits. This is important. Get help right away if: You have pain in your chest, neck, ear, arm, jaw, stomach, or back that: Lasts more than a few minutes. Is recurring. Is not relieved by taking medicine under your tongue (sublingual nitroglycerin). You have profuse sweating without cause. You have unexplained: Heartburn or indigestion. Shortness of breath or difficulty breathing. Fluttering or fast heartbeat (palpitations). Nausea or vomiting. Fatigue or weakness. Feelings of nervousness or anxiety. You have sudden light-headedness or dizziness. You faint. These symptoms may be an emergency. Get help right away. Call 911. Do not wait to see if the symptoms will go away. Do not drive yourself to the hospital. Summary Coronary artery disease (CAD) is a condition in which the arteries that lead to the heart (coronary arteries) become narrow or blocked. Prolonged reduced blood flow can cause a heart attack. CAD can be treated with lifestyle changes, medicines, coronary angioplasty or stents, coronary artery bypass surgery, or a combination of these treatments. Keep all follow-up visits. This is important. This information is not intended to replace advice given to you by your health care provider. Make sure you discuss any questions you have with your health care provider. Document Revised: 03/05/2021 Document Reviewed: 03/05/2021 [employer] Patient Education  2023 [employer]  

## 2021-12-09 NOTE — Assessment & Plan Note (Signed)
Encouraged moist heat and gentle stretching as tolerated. May try NSAIDs and prescription meds as directed and report if symptoms worsen or seek immediate care. He has had a stimulator placed and that has helped his pain

## 2021-12-09 NOTE — Assessment & Plan Note (Signed)
Encourage heart healthy diet such as MIND or DASH diet, increase exercise, avoid trans fats, simple carbohydrates and processed foods, consider a krill or fish or flaxseed oil cap daily. Tolerating statin 

## 2021-12-09 NOTE — Assessment & Plan Note (Signed)
Hydrate and monitor, no recent flares

## 2021-12-09 NOTE — Assessment & Plan Note (Signed)
hgba1c acceptable, minimize simple carbs. Increase exercise as tolerated. Continue current meds 

## 2021-12-09 NOTE — Assessment & Plan Note (Signed)
Supplement and monitor 

## 2021-12-09 NOTE — Assessment & Plan Note (Signed)
Well controlled, no changes to meds. Encouraged heart healthy diet such as the DASH diet and exercise as tolerated.  °

## 2021-12-09 NOTE — Progress Notes (Signed)
Subjective:   By signing my name below, I, [name], attest that this documentation has been prepared under the direction and in the presence of [name], MD 12/09/2021.    Patient ID: Brent [name], male    DOB: 08-03-1955, 66 y.o.   MRN: [id number]  No chief complaint on file.  HPI Patient is in today for an office visit.  Stent: He reports that he is feeling much better and more energetic after receiving a stent in 11/2021. He says that he is now sleeping better and currently uses a CPAP. He denies having shortness of breath. He states that he has an appointment scheduled on 12/11/2021 with his cardiologist. He is currently taking Nitroglycerin 0.4 mg and Plavix 75 mg.   Blood pressure: His blood pressure is slightly elevated today.  BP Readings from Last 3 Encounters:  12/09/21 (!) 142/78  11/28/21 (!) 149/83  11/24/21 120/82   Pulse Readings from Last 3 Encounters:  12/09/21 80  11/28/21 77  11/24/21 80   Endocrinology: He has an appointment scheduled for 12/10/2021 with his endocrinologist Dr. Burney Gauze.   Immunizations: He has been informed about receiving the COVID-19 and RSV immunizations.  Past Medical History:  Diagnosis Date   Acid reflux disease    ACID REFLUX DISEASE 07/05/2007   Acute gastric ulcer with bleeding    Alcohol abuse    Anemia    Atherosclerosis    Benign neoplasm of colon 07/05/2007   Bilateral hip pain 10/05/2016   Bladder cancer ([place])    Breast pain, left 11/17/2011   CAD (coronary artery disease)    Chest pain    a. Reportedly negative dobut echo performed prior to gastric bypass in 03/2011;  b. 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 12/2011 Cath: LM nl, LAD 50p, D1 56m LCX min irregs, OM3 30, RCA 25p, 715mFFR 0.99->0.89), PDA 30, EF 65%, Med Rx.   COLONIC POLYPS, HX OF 10/29/2008   Diarrhea 06/13/2010   Diverticulosis 07/05/2018   DIVERTICULOSIS, COLON 10/29/2008   DM (diabetes mellitus), type 2, uncontrolled    GI bleed     Gout 03/04/2013   Hearing loss 05/24/2013   Previous audiology evaluation completely.   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   Neck pain 03/2015   Other and unspecified hyperlipidemia 11/16/2012   Post-operative nausea and vomiting    after the surgery in hospital in March 2020/ past the cauderization   Preventative health care 11/17/2011   Renal stone 11/2013   Sleep apnea    a. CPAP   Spinal stenosis    Tear of meniscus of left knee 2012   Past Surgical History:  Procedure Laterality Date   ANKLE SURGERY Right 1994   BICEPS TENDON REPAIR     left side   CARDIAC CATHETERIZATION     denies any chest pain in the past 2 years   COLONOSCOPY     colonoscopy polyps     CORONARY STENT INTERVENTION N/A 11/28/2021   Procedure: CORONARY STENT INTERVENTION;  Surgeon: JoMartiniquePeter M, MD;  [facility];  Service: Cardiovascular;  Laterality: N/A;   ESOPHAGOGASTRODUODENOSCOPY N/A 03/27/2013   Procedure: ESOPHAGOGASTRODUODENOSCOPY (EGD);  Surgeon: JoDocia Chuck  Henrene Pastor, MD;  Location: [facility];  Service: Endoscopy;  Laterality: N/A;   ESOPHAGOGASTRODUODENOSCOPY (EGD) WITH PROPOFOL N/A 07/06/2018   Procedure: ESOPHAGOGASTRODUODENOSCOPY (EGD) WITH PROPOFOL;  Surgeon: Jerene Bears, MD;  Location: [facility];  Service: Gastroenterology;  Laterality: N/A;   GASTRIC BYPASS     HERNIA REPAIR     HOT HEMOSTASIS N/A 07/06/2018   Procedure: HOT HEMOSTASIS (ARGON PLASMA COAGULATION/BICAP);  Surgeon: Jerene Bears, MD;  Location: [facility];  Service: Gastroenterology;  Laterality: N/A;   KNEE ARTHROSCOPY Left 11/06/2010   Left, torn meniscus (repaired)   LEFT HEART CATH AND CORONARY ANGIOGRAPHY N/A 11/28/2021   Procedure: LEFT  HEART CATH AND CORONARY ANGIOGRAPHY;  Surgeon: Martinique, Peter M, MD;  Location: [facility];  Service: Cardiovascular;  Laterality: N/A;   LEFT HEART CATHETERIZATION WITH CORONARY ANGIOGRAM N/A 12/23/2011   Procedure: LEFT HEART CATHETERIZATION WITH CORONARY ANGIOGRAM;  Surgeon: Minus Breeding, MD;  Location[facility];  Service: Cardiovascular;  Laterality: N/A;   REPLACEMENT TOTAL KNEE Right 01/2016   removed scar tissue/ cut tip of nerve bundle and re-route   right knee arthroscopy Right 07/05/14   Dr. Hart Robinsons, [place].   ROTATOR CUFF REPAIR  2019   left   SCHLEROTHERAPY  07/06/2018   Procedure: SCHLEROTHERAPY;  Surgeon: Jerene Bears, MD;  Location: [facility];  Service: Gastroenterology;;   TONSILLECTOMY  age 66   TONSILLECTOMY     as a child   TOTAL KNEE ARTHROPLASTY Right 01/20/2016   Procedure: RIGHT TOTAL KNEE ARTHROPLASTY;  Surgeon: Paralee Cancel, MD;  Location: [facility];  Service: Orthopedics;  Laterality: Right;   TRANSURETHRAL RESECTION OF BLADDER TUMOR N/A 09/24/2020   Procedure: TRANSURETHRAL RESECTION OF BLADDER TUMOR (TURBT)  RIGHT retrograde pylegram;  Surgeon: [name], MD;  Location: [facility];  Service: Urology;  Laterality: N/A;   UPPER GI ENDOSCOPY  03/27/13   Family History  Problem Relation Age of Onset   Diabetes Mother    Hypertension Mother    Stroke Mother    Hyperlipidemia Mother    Hypertension Father    Colon polyps Father    Heart attack Father 32   Stroke Father    Heart attack Brother    Diabetes Brother    Heart disease Brother    Heart attack Brother        Multiple   Diabetes Brother    Diabetes Sister    Stroke Sister    Obesity Brother    Diabetes Brother    Heart disease Brother    Hypertension Maternal Grandmother    ADD / ADHD Daughter    Heart disease Brother    Stomach cancer Neg Hx    Colon cancer Neg Hx    Esophageal cancer Neg Hx    Rectal cancer Neg Hx    Social History   Socioeconomic History   Marital  status: Married    Spouse name: Not on file   Number of children: 2   Years of education: Not on file   Highest education level: Not on file  Occupational History   Occupation: TEACHER    Employer: [employer]  Tobacco Use   Smoking status: Former    Packs/day: 1.50    Years: 20.00    Total pack years: 30.00    Types: Cigarettes    Quit date: 04/21/1991    Years since quitting: 30.6   Smokeless tobacco: Never  Vaping Use   Vaping Use: Never used  Substance and Sexual Activity   Alcohol use: Yes    Alcohol/week: 3.0 standard drinks of alcohol    Types: 3 Cans of beer per week    Comment: weekly   Drug use: No   Sexual activity: Not on file  Other Topics Concern   Not on file  Social History Narrative   Lives with wife in [place].  Does not routinely exercise.   Social Determinants of Health   Financial Resource Strain: Low Risk  (12/01/2021)   Overall Financial Resource Strain (CARDIA)    Difficulty of Paying Living Expenses: Not hard at all  Food Insecurity: No Food Insecurity (12/01/2021)   Hunger Vital Sign    Worried About Running Out of Food in the Last Year: Never true    Ran Out of Food in the Last Year: Never true  Transportation Needs: No Transportation Needs (12/01/2021)   PRAPARE - Hydrologist (Medical): No    Lack of Transportation (Non-Medical): No  Physical Activity: Inactive (12/01/2021)   Exercise Vital Sign    Days of Exercise per Week: 0 days    Minutes of Exercise per Session: 0 min  Stress: No Stress Concern Present (12/01/2021)   [place]    Feeling of Stress : Not at all  Social Connections: [place] (12/01/2021)   Social Connection and Isolation Panel [NHANES]    Frequency of Communication with Friends and Family: More than three times a week    Frequency of Social Gatherings with Friends and Family: More than three times a week     Attends Religious Services: More than 4 times per year    Active Member of [employer] or Organizations: Yes    Attends Music therapist: More than 4 times per year    Marital Status: Married  Human resources officer Violence: Not At Risk (12/01/2021)   Humiliation, Afraid, Rape, and Kick questionnaire    Fear of Current or Ex-Partner: No    Emotionally Abused: No    Physically Abused: No    Sexually Abused: No   Outpatient Medications Prior to Visit  Medication Sig Dispense Refill   acetaminophen (TYLENOL) 500 MG tablet Take 1,000 mg by mouth every 6 (six) hours as needed for moderate pain or headache.     amitriptyline (ELAVIL) 50 MG tablet TAKE 1 TABLET AT BEDTIME 90 tablet 3   amoxicillin (AMOXIL) 500 MG capsule Take 2,000 mg by mouth See admin instructions.  TAKE 4 CAPSULES BY MOUTH PRIOR TO APPOINTMENT     aspirin EC 81 MG tablet Take 1 tablet (81 mg total) by mouth daily. Swallow whole. 90 tablet 3   Cholecalciferol (VITAMIN D) 50 MCG (2000 UT) tablet Take 6,000 Units by mouth daily.     clopidogrel (PLAVIX) 75 MG tablet Take 1 tablet (75 mg total) by mouth daily with breakfast. 90 tablet 2   Cyanocobalamin (VITAMIN B-12) 1000 MCG SUBL Place 1,000 mcg under the tongue 2 (two) times a week.     Dulaglutide (TRULICITY) 1.5 LH/7.3SK SOPN Inject 1.5 mg into the skin every Friday. Trulicity 1.5 AJ/6.8 mL subcutaneous pen injector     econazole nitrate 1 % cream Apply topically daily. (Patient taking differently: Apply 1 Application topically daily as needed (fungus).) 30 g 1   EPINEPHrine 0.3 mg/0.3 mL IJ SOAJ injection INJECT 0.3 MLS (0.3 MG TOTAL) INTO THE MUSCLE ONCE FOR 1 DOSE 2 each 11   famotidine (PEPCID) 40  MG tablet TAKE 1 TABLET AT BEDTIME AS NEEDED FOR HEARTBURN OR INDIGESTION 90 tablet 3   fenofibrate 160 MG tablet TAKE 1 TABLET DAILY 90 tablet 3   Ferrous Gluconate-C-Folic Acid (IRON-C PO) Take 1 tablet by mouth daily. Iron 65 mg and vit C-125 mg     fluticasone (FLONASE) 50  MCG/ACT nasal spray Place 2 sprays into both nostrils daily. (Patient taking differently: Place 1 spray into both nostrils 2 (two) times daily as needed for allergies.) 48 g 1   gabapentin (NEURONTIN) 300 MG capsule TAKE 1 CAPSULE TWICE A DAY AND 2 CAPSULES AT BEDTIME (Patient taking differently: Take 300-900 mg by mouth See admin instructions. TAKE 1 CAPSULE TWICE A DAY (Morning and Lunch) AND 3 CAPSULES IN THE EVENING AND AT BEDTIME) 360 capsule 1   glucose blood (FREESTYLE LITE) test strip Use to check blood sugar 4 times day.  Dx Code: E11.9 400 each 1   glyBURIDE (DIABETA) 5 MG tablet Take 1 tablet (5 mg total) by mouth daily. (Patient taking differently: Take 2.5 mg by mouth daily.) 90 tablet 1   hydrochlorothiazide (MICROZIDE) 12.5 MG capsule Take 1 capsule (12.5 mg total) by mouth daily. 90 capsule 1   insulin lispro (HUMALOG) 100 UNIT/ML KwikPen Inject 2-6 Units into the skin 3 (three) times daily. Per sliding scale. (Patient taking differently: Inject 2-6 Units into the skin 3 (three) times daily as needed (high blood sugar). Per sliding scale.) 15 mL 0   Insulin Pen Needle (PEN NEEDLES 31GX5/16") 31G X 8 MM MISC Use as directed with Humalog 3 times a day and at bedtime 100 each 0   losartan (COZAAR) 50 MG tablet TAKE 1 TABLET DAILY (KEEP UPCOMING APPOINTMENT IN [facility] 2023 WITH CARDIOLOGIST BEFORE ANYMORE REFILLS) 90 tablet 3   Magnesium 100 MG CAPS Take 1 capsule (100 mg total) by mouth daily. 30 capsule 1   metoprolol tartrate (LOPRESSOR) 100 MG tablet Take 1 tablet (100 mg total) by mouth 2 (two) times daily. 180 tablet 3   mometasone (ELOCON) 0.1 % cream Apply 1 Application topically daily. (Patient taking differently: Apply 1 Application topically daily as needed (fungus).) 90 g 1   Multiple Vitamin (MULTIVITAMIN) tablet Take 1 tablet by mouth daily.     nitroGLYCERIN (NITROSTAT) 0.4 MG SL tablet Place 1 tablet (0.4 mg total) under the tongue every 5 (five) minutes as needed. 25 tablet 2    oxycodone (OXY-IR) 5 MG capsule Take 5 mg by mouth 3 (three) times daily as needed for pain.     pantoprazole (PROTONIX) 40 MG tablet TAKE 1 TABLET TWICE A DAY BEFORE MEALS 180 tablet 3   Pitavastatin Calcium (LIVALO) 2 MG TABS Take 1 tablet (2 mg total) by mouth daily. 90 tablet 3   sodium chloride (OCEAN) 0.65 % SOLN nasal spray Place 1 spray into both nostrils as needed for congestion (nose irritation). 30 mL 0   No facility-administered medications prior to visit.   Allergies  Allergen Reactions   Bee Venom Anaphylaxis   Lipitor [Atorvastatin] Other (See Comments)    Myalgias, memory changes   Morphine Other (See Comments)    hyperactive   Metformin And Related Other (See Comments)    Myalgias and weakness   Robaxin [Methocarbamol]     GI bleed    Baclofen     Acted drunk   Medrol [Methylprednisolone]     Hyperglycemia    Simvastatin     Joint pain   Hydrocodone Itching   Pravastatin  Other (See Comments)    Made joints   Review of Systems  Respiratory:  Negative for shortness of breath.       Objective:    Physical Exam Constitutional:      General: He is not in acute distress.    Appearance: Normal appearance. He is not ill-appearing.  HENT:     Head: Normocephalic and atraumatic.     Right Ear: External ear normal.     Left Ear: External ear normal.     Mouth/Throat:     Mouth: Mucous membranes are moist.     Pharynx: Oropharynx is clear.  Eyes:     Extraocular Movements: Extraocular movements intact.     Pupils: Pupils are equal, round, and reactive to light.  Neck:     Vascular: No carotid bruit.  Cardiovascular:     Rate and Rhythm: Normal rate and regular rhythm.     Pulses: Normal pulses.     Heart sounds: Normal heart sounds. No murmur heard.    No gallop.  Pulmonary:     Effort: Pulmonary effort is normal. No respiratory distress.     Breath sounds: Normal breath sounds. No wheezing or rales.  Abdominal:     General: Bowel sounds are normal.   Lymphadenopathy:     Cervical: No cervical adenopathy.  Skin:    General: Skin is warm and dry.  Neurological:     Mental Status: He is alert and oriented to person, place, and time.  Psychiatric:        Mood and Affect: Mood normal.        Behavior: Behavior normal.        Judgment: Judgment normal.    There were no vitals taken for this visit. Wt Readings from Last 3 Encounters:  12/01/21 255 lb (115.7 kg)  11/28/21 255 lb (115.7 kg)  11/24/21 257 lb (116.6 kg)   Diabetic Foot Exam - Simple   No data filed    Lab Results  Component Value Date   WBC 5.8 11/24/2021   HGB 13.8 11/24/2021   HCT 41.3 11/24/2021   PLT 242 11/24/2021   GLUCOSE 168 (H) 12/08/2021   CHOL 143 05/22/2021   TRIG 289.0 (H) 05/22/2021   HDL 49.80 05/22/2021   LDLDIRECT 74.0 05/22/2021   LDLCALC 45 01/20/2021   ALT 15 11/18/2021   AST 21 11/18/2021   NA 139 12/08/2021   K 4.5 12/08/2021   CL 104 12/08/2021   CREATININE 1.24 12/08/2021   BUN 21 12/08/2021   CO2 22 12/08/2021   TSH 1.340 11/24/2021   PSA 1.55 06/25/2017   INR 1.3 (H) 07/06/2018   HGBA1C 8.0 (H) 09/24/2020   MICROALBUR 1.1 12/02/2015   Lab Results  Component Value Date   TSH 1.340 11/24/2021   Lab Results  Component Value Date   WBC 5.8 11/24/2021   HGB 13.8 11/24/2021   HCT 41.3 11/24/2021   MCV 89 11/24/2021   PLT 242 11/24/2021   Lab Results  Component Value Date   NA 139 12/08/2021   K 4.5 12/08/2021   CO2 22 12/08/2021   GLUCOSE 168 (H) 12/08/2021   BUN 21 12/08/2021   CREATININE 1.24 12/08/2021   BILITOT 0.7 11/18/2021   ALKPHOS 31 (L) 11/18/2021   AST 21 11/18/2021   ALT 15 11/18/2021   PROT 6.4 (L) 11/18/2021   ALBUMIN 4.7 11/18/2021   CALCIUM 9.3 12/08/2021   ANIONGAP 9 11/18/2021   EGFR 64 12/08/2021   GFR  66.55 10/06/2021   Lab Results  Component Value Date   CHOL 143 05/22/2021   Lab Results  Component Value Date   HDL 49.80 05/22/2021   Lab Results  Component Value Date   LDLCALC  45 01/20/2021   Lab Results  Component Value Date   TRIG 289.0 (H) 05/22/2021   Lab Results  Component Value Date   CHOLHDL 3 05/22/2021   Lab Results  Component Value Date   HGBA1C 8.0 (H) 09/24/2020      Assessment & Plan:   Problem List Items Addressed This Visit   None  No orders of the defined types were placed in this encounter.  I, [name], personally preformed the services described in this documentation.  All medical record entries made by the scribe were at my direction and in my presence.  I have reviewed the chart and discharge instructions (if applicable) and agree that the record reflects my personal performance and is accurate and complete. 12/09/2021  I,Mohammed Iqbal,acting as a scribe for [name], MD.,have documented all relevant documentation on the behalf of [name], MD,as directed by  [name], MD while in the presence of [name], MD.  [name]

## 2021-12-10 ENCOUNTER — Other Ambulatory Visit: Payer: Self-pay | Admitting: Family Medicine

## 2021-12-10 DIAGNOSIS — Z8679 Personal history of other diseases of the circulatory system: Secondary | ICD-10-CM | POA: Diagnosis not present

## 2021-12-10 DIAGNOSIS — Z7984 Long term (current) use of oral hypoglycemic drugs: Secondary | ICD-10-CM | POA: Diagnosis not present

## 2021-12-10 DIAGNOSIS — Z7985 Long-term (current) use of injectable non-insulin antidiabetic drugs: Secondary | ICD-10-CM | POA: Diagnosis not present

## 2021-12-10 DIAGNOSIS — E1165 Type 2 diabetes mellitus with hyperglycemia: Secondary | ICD-10-CM | POA: Diagnosis not present

## 2021-12-10 DIAGNOSIS — Z9884 Bariatric surgery status: Secondary | ICD-10-CM | POA: Diagnosis not present

## 2021-12-10 LAB — LIPID PANEL
Cholesterol: 129 mg/dL (ref 0–200)
HDL: 44 mg/dL (ref >39.00)
LDL Cholesterol: 58 mg/dL (ref 0–99)
NonHDL: 85.09
Total CHOL/HDL Ratio: 3
Triglycerides: 134 mg/dL (ref 0.0–149.0)
VLDL: 26.8 mg/dL (ref 0.0–40.0)

## 2021-12-10 MED ORDER — GLYBURIDE 5 MG PO TABS: 2.5000 mg | ORAL_TABLET | Freq: Every day | ORAL | Status: DC

## 2021-12-10 NOTE — Assessment & Plan Note (Signed)
Recently underwent stenting and he feels much better, his fatigue has improved as has his DOE. He will be on Plavix for the next 6 months

## 2021-12-10 NOTE — Assessment & Plan Note (Signed)
Using CPAP nightly 

## 2021-12-11 ENCOUNTER — Telehealth: Payer: Self-pay | Admitting: Physician Assistant

## 2021-12-11 ENCOUNTER — Ambulatory Visit (INDEPENDENT_AMBULATORY_CARE_PROVIDER_SITE_OTHER): Payer: Medicare Other | Admitting: Physician Assistant

## 2021-12-11 ENCOUNTER — Encounter: Payer: Self-pay | Admitting: Physician Assistant

## 2021-12-11 VITALS — BP 122/70 | HR 86 | Ht 68.0 in | Wt 263.2 lb

## 2021-12-11 DIAGNOSIS — R0609 Other forms of dyspnea: Secondary | ICD-10-CM

## 2021-12-11 DIAGNOSIS — E785 Hyperlipidemia, unspecified: Secondary | ICD-10-CM | POA: Diagnosis not present

## 2021-12-11 DIAGNOSIS — I10 Essential (primary) hypertension: Secondary | ICD-10-CM

## 2021-12-11 DIAGNOSIS — I25110 Atherosclerotic heart disease of native coronary artery with unstable angina pectoris: Secondary | ICD-10-CM | POA: Diagnosis not present

## 2021-12-11 DIAGNOSIS — Z79899 Other long term (current) drug therapy: Secondary | ICD-10-CM

## 2021-12-11 MED ORDER — ISOSORBIDE MONONITRATE ER 30 MG PO TB24: 30.0000 mg | ORAL_TABLET | Freq: Every day | ORAL | 1 refills | Status: DC

## 2021-12-11 MED ORDER — ISOSORBIDE MONONITRATE ER 30 MG PO TB24: 30.0000 mg | ORAL_TABLET | Freq: Every day | ORAL | 0 refills | Status: DC

## 2021-12-11 NOTE — Telephone Encounter (Signed)
Pt c/o medication issue:  1. Name of Medication: isosorbide mononitrate (IMDUR) 30 MG 24 hr tablet  2. How are you currently taking this medication (dosage and times per day)? Take 1 tablet (30 mg total) by mouth daily.  3. Are you having a reaction (difficulty breathing--STAT)?   4. What is your medication issue? Patient would like for it to go to [place], [place] - [id number]

## 2021-12-11 NOTE — Progress Notes (Signed)
Cardiology Office Note:    Date:  12/11/2021   ID:  Tacoma, Merida 1955/08/06, MRN [id number]  PCP:  [name], MD  [facility] Cardiologist:  Candee Furbish, MD  [facility] Electrophysiologist:  None   Chief Complaint: Cath follow up  History of Present Illness:    Brent [name] is a 66 y.o. male with a hx of CAD, hypertension, hyperlipidemia, OSA on CPAP, diabetes and GI bleed seen for hospital follow up.   Underwent cardiac catheterization 12/2011 with 70% mid RCA lesion managed medically.  Seen in the office 06/2021 and reported limited activity due to orthopedic pain.  Also reported palpitations and shortness of breath.  Underwent Lexiscan Myoview 07/25/2021 which was low risk with LVEF of 62%.  He called the office 11/23/2021 reporting worsening shortness of breath with exertion and extreme fatigue. Underwent cardiac cath with single-vessel obstructive CAD involving the mid RCA with successful PCI/DES x1. Plan for DAPT with ASA/Plavix for at least 6 months. Same day PCI discharge.   Echo 12/08/21 with LVEF of 55-60%, mild AI, mild MR  Here today for follow up.  Reports compliance with medication.  His dyspnea on exertion is improving since PCI.  However he continues to have intermittent left-sided chest pressure episode.  3 days ago he took sublingual nitroglycerin with resolution of chest pain within 5 minutes.  He is trying to lose weight.  Denies orthopnea, PND, syncope, lower extremity edema or melena.  Past Medical History:  Diagnosis Date   Acid reflux disease    ACID REFLUX DISEASE 07/05/2007   Acute gastric ulcer with bleeding    Alcohol abuse    Anemia    Atherosclerosis    Benign neoplasm of colon 07/05/2007   Bilateral hip pain 10/05/2016   Bladder cancer ([place])    Breast pain, left 11/17/2011   CAD (coronary artery disease)    Chest pain    a. Reportedly negative dobut echo performed prior to gastric bypass in 03/2011;  b. CTA 12/2011 Mod Mid RCA  stenosis;  c. 12/2011 Cath: LM nl, LAD 50p, D1 4m LCX min irregs, OM3 30, RCA 25p, 754mFFR 0.99->0.89), PDA 30, EF 65%, Med Rx. d. Cath 11/2021 - 90% m RCA s/p PCI/DES x1   COLONIC POLYPS, HX OF 10/29/2008   Diarrhea 06/13/2010   Diverticulosis 07/05/2018   DIVERTICULOSIS, COLON 10/29/2008   DM (diabetes mellitus), type 2, uncontrolled    GI bleed    Gout 03/04/2013   Hearing loss 05/24/2013   Previous audiology evaluation completely.   HTN (hypertension) 07/14/2010   Hx of colonic polyps    HYPERSOMNIA, ASSOCIATED WITH SLEEP APNEA 07/26/2008   Hypertension 07/14/2010   Impotence of organic origin 07/05/2007   Internal hemorrhoids    Knee pain, left 10/10/2010   Morbid obesity ([place]/11/2009   a. 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   Neck pain 03/2015   Other and unspecified hyperlipidemia 11/16/2012   Post-operative nausea and vomiting    after the surgery in hospital in March 2020/ past the cauderization   Preventative health care 11/17/2011   Renal stone 11/2013   Sleep apnea    a. CPAP   Spinal stenosis    Tear of meniscus of left knee 2012    Past Surgical History:  Procedure Laterality Date   ANKLE SURGERY Right 1994   BICEPS TENDON REPAIR     left side   CARDIAC CATHETERIZATION     denies any chest pain in the past  2 years   COLONOSCOPY     colonoscopy polyps     CORONARY STENT INTERVENTION N/A 11/28/2021   Procedure: CORONARY STENT INTERVENTION;  Surgeon: Martinique, Peter M, MD;  Location: [place] [facility];  Service: Cardiovascular;  Laterality: N/A;   ESOPHAGOGASTRODUODENOSCOPY N/A 03/27/2013   Procedure: ESOPHAGOGASTRODUODENOSCOPY (EGD);  Surgeon: Irene Shipper, MD;  Location: [facility];  Service: Endoscopy;  Laterality: N/A;   ESOPHAGOGASTRODUODENOSCOPY (EGD) WITH PROPOFOL N/A 07/06/2018   Procedure: ESOPHAGOGASTRODUODENOSCOPY (EGD) WITH PROPOFOL;  Surgeon: Jerene Bears, MD;  Location: [facility];  Service: Gastroenterology;  Laterality: N/A;   GASTRIC BYPASS      HERNIA REPAIR     HOT HEMOSTASIS N/A 07/06/2018   Procedure: HOT HEMOSTASIS (ARGON PLASMA COAGULATION/BICAP);  Surgeon: Jerene Bears, MD;  Location: [facility];  Service: Gastroenterology;  Laterality: N/A;   KNEE ARTHROSCOPY Left 11/06/2010   Left, torn meniscus (repaired)   LEFT HEART CATH AND CORONARY ANGIOGRAPHY N/A 11/28/2021   Procedure: LEFT HEART CATH AND CORONARY ANGIOGRAPHY;  Surgeon: Martinique, Peter M, MD;  Location: [place] [facility];  Service: Cardiovascular;  Laterality: N/A;   LEFT HEART CATHETERIZATION WITH CORONARY ANGIOGRAM N/A 12/23/2011   Procedure: LEFT HEART CATHETERIZATION WITH CORONARY ANGIOGRAM;  Surgeon: Minus Breeding, MD;  Location: [facility];  Service: Cardiovascular;  Laterality: N/A;   REPLACEMENT TOTAL KNEE Right 01/2016   removed scar tissue/ cut tip of nerve bundle and re-route   right knee arthroscopy Right 07/05/14   Dr. Hart Robinsons, [place].   ROTATOR CUFF REPAIR  2019   left   SCHLEROTHERAPY  07/06/2018   Procedure: SCHLEROTHERAPY;  Surgeon: Jerene Bears, MD;  Location: [facility];  Service: Gastroenterology;;   TONSILLECTOMY  age 28   TONSILLECTOMY     as a child   TOTAL KNEE ARTHROPLASTY Right 01/20/2016   Procedure: RIGHT TOTAL KNEE ARTHROPLASTY;  Surgeon: Paralee Cancel, MD;  Location: [facility];  Service: Orthopedics;  Laterality: Right;   TRANSURETHRAL RESECTION OF BLADDER TUMOR N/A 09/24/2020   Procedure: TRANSURETHRAL RESECTION OF BLADDER TUMOR (TURBT)  RIGHT retrograde pylegram;  Surgeon: [name], MD;  Location: [facility];  Service: Urology;  Laterality: N/A;   UPPER GI ENDOSCOPY  03/27/13    Current Medications: Current Meds  Medication Sig   acetaminophen (TYLENOL) 500 MG tablet Take 1,000 mg by mouth every 6 (six) hours as needed for moderate pain or headache.   amitriptyline (ELAVIL) 50 MG tablet TAKE 1 TABLET AT BEDTIME   amoxicillin (AMOXIL) 500 MG capsule Take 2,000 mg by mouth See admin instructions.  TAKE 4 CAPSULES BY MOUTH  PRIOR TO APPOINTMENT   aspirin EC 81 MG tablet Take 1 tablet (81 mg total) by mouth daily. Swallow whole.   Cholecalciferol (VITAMIN D) 50 MCG (2000 UT) tablet Take 6,000 Units by mouth daily.   clopidogrel (PLAVIX) 75 MG tablet Take 1 tablet (75 mg total) by mouth daily with breakfast.   Cyanocobalamin (VITAMIN B-12) 1000 MCG SUBL Place 1,000 mcg under the tongue 2 (two) times a week.   Dulaglutide (TRULICITY) 1.5 EN/2.7PO SOPN Inject 1.5 mg into the skin every Friday. Trulicity 1.5 EU/2.3 mL subcutaneous pen injector   econazole nitrate 1 % cream Apply topically daily.   EPINEPHrine 0.3 mg/0.3 mL IJ SOAJ injection INJECT 0.3 MLS (0.3 MG TOTAL) INTO THE MUSCLE ONCE FOR 1 DOSE   famotidine (PEPCID) 40 MG tablet TAKE 1 TABLET AT BEDTIME AS NEEDED FOR HEARTBURN OR INDIGESTION   fenofibrate 160 MG  tablet TAKE 1 TABLET DAILY   Ferrous Gluconate-C-Folic Acid (IRON-C PO) Take 1 tablet by mouth daily. Iron 65 mg and vit C-125 mg   fluticasone (FLONASE) 50 MCG/ACT nasal spray USE 2 SPRAYS IN EACH NOSTRIL DAILY   gabapentin (NEURONTIN) 300 MG capsule TAKE 1 CAPSULE TWICE A DAY AND 2 CAPSULES AT BEDTIME   glucose blood (FREESTYLE LITE) test strip Use to check blood sugar 4 times day.  Dx Code: E11.9   glyBURIDE (DIABETA) 5 MG tablet Take 0.5 tablets (2.5 mg total) by mouth daily.   hydrochlorothiazide (MICROZIDE) 12.5 MG capsule Take 1 capsule (12.5 mg total) by mouth daily.   insulin lispro (HUMALOG) 100 UNIT/ML KwikPen Inject 2-6 Units into the skin 3 (three) times daily. Per sliding scale.   Insulin Pen Needle (PEN NEEDLES 31GX5/16") 31G X 8 MM MISC Use as directed with Humalog 3 times a day and at bedtime   isosorbide mononitrate (IMDUR) 30 MG 24 hr tablet Take 1 tablet (30 mg total) by mouth daily.   losartan (COZAAR) 50 MG tablet TAKE 1 TABLET DAILY (KEEP UPCOMING APPOINTMENT IN MARCH 2023 WITH CARDIOLOGIST BEFORE ANYMORE REFILLS)   Magnesium 100 MG CAPS Take 1 capsule (100 mg total) by mouth daily.    metoprolol tartrate (LOPRESSOR) 100 MG tablet Take 1 tablet (100 mg total) by mouth 2 (two) times daily.   mometasone (ELOCON) 0.1 % cream Apply 1 Application topically daily.   Multiple Vitamin (MULTIVITAMIN) tablet Take 1 tablet by mouth daily.   nitroGLYCERIN (NITROSTAT) 0.4 MG SL tablet Place 1 tablet (0.4 mg total) under the tongue every 5 (five) minutes as needed.   oxycodone (OXY-IR) 5 MG capsule Take 5 mg by mouth 3 (three) times daily as needed for pain.   pantoprazole (PROTONIX) 40 MG tablet TAKE 1 TABLET TWICE A DAY BEFORE MEALS   Pitavastatin Calcium (LIVALO) 2 MG TABS Take 1 tablet (2 mg total) by mouth daily.   sodium chloride (OCEAN) 0.65 % SOLN nasal spray Place 1 spray into both nostrils as needed for congestion (nose irritation).     Allergies:   Bee venom, Lipitor [atorvastatin], Morphine, Metformin and related, Robaxin [methocarbamol], Baclofen, Medrol [methylprednisolone], Simvastatin, Hydrocodone, and Pravastatin   Social History   Socioeconomic History   Marital status: Married    Spouse name: Not on file   Number of children: 2   Years of education: Not on file   Highest education level: Not on file  Occupational History   Occupation: TEACHER    Employer: [employer]  Tobacco Use   Smoking status: Former    Packs/day: 1.50    Years: 20.00    Total pack years: 30.00    Types: Cigarettes    Quit date: 04/21/1991    Years since quitting: 30.6   Smokeless tobacco: Never  Vaping Use   Vaping Use: Never used  Substance and Sexual Activity   Alcohol use: Yes    Alcohol/week: 3.0 standard drinks of alcohol    Types: 3 Cans of beer per week    Comment: weekly   Drug use: No   Sexual activity: Not on file  Other Topics Concern   Not on file  Social History Narrative   Lives with wife in [place].  Does not routinely exercise.   Social Determinants of Health   Financial Resource Strain: Low Risk  (12/01/2021)   Overall Financial Resource Strain  (CARDIA)    Difficulty of Paying Living Expenses: Not hard at all  Food  Insecurity: No Food Insecurity (12/01/2021)   Hunger Vital Sign    Worried About Running Out of Food in the Last Year: Never true    Ran Out of Food in the Last Year: Never true  Transportation Needs: No Transportation Needs (12/01/2021)   PRAPARE - Hydrologist (Medical): No    Lack of Transportation (Non-Medical): No  Physical Activity: Inactive (12/01/2021)   Exercise Vital Sign    Days of Exercise per Week: 0 days    Minutes of Exercise per Session: 0 min  Stress: No Stress Concern Present (12/01/2021)   [place]    Feeling of Stress : Not at all  Social Connections: [place] (12/01/2021)   Social Connection and Isolation Panel [NHANES]    Frequency of Communication with Friends and Family: More than three times a week    Frequency of Social Gatherings with Friends and Family: More than three times a week    Attends Religious Services: More than 4 times per year    Active Member of [employer] or Organizations: Yes    Attends Music therapist: More than 4 times per year    Marital Status: Married     Family History: The patient's family history includes ADD / ADHD in his daughter; Colon polyps in his father; Diabetes in his brother, brother, brother, mother, and sister; Heart attack in his brother and brother; Heart attack (age of onset: 36) in his father; Heart disease in his brother, brother, and brother; Hyperlipidemia in his mother; Hypertension in his father, maternal grandmother, and mother; Obesity in his brother; Stroke in his father, mother, and sister. There is no history of Stomach cancer, Colon cancer, Esophageal cancer, or Rectal cancer.    ROS:   Please see the history of present illness.    All other systems reviewed and are negative.   EKGs/Labs/Other Studies Reviewed:    The  following studies were reviewed today:  Echo 12/08/2021 IMPRESSIONS    1. Left ventricular ejection fraction, by estimation, is 55 to 60%. The  left ventricle has normal function. The left ventricle has no regional  wall motion abnormalities. Left ventricular diastolic parameters are  indeterminate.   2. Right ventricular systolic function is normal. The right ventricular  size is normal. There is normal pulmonary artery systolic pressure. The  estimated right ventricular systolic pressure is 11.9 mmHg.   3. The mitral valve is grossly normal. Mild mitral valve regurgitation.  No evidence of mitral stenosis.   4. The aortic valve is tricuspid. There is mild calcification of the  aortic valve. Aortic valve regurgitation is mild. Aortic valve sclerosis  is present, with no evidence of aortic valve stenosis.   5. The inferior vena cava is normal in size with greater than 50%  respiratory variability, suggesting right atrial pressure of 3 mmHg.   Cardiac Catheterization 11/28/2021:     Mid LAD lesion is 40% stenosed.   1st Diag lesion is 40% stenosed.   Mid Cx to Dist Cx lesion is 40% stenosed.   Prox RCA to Mid RCA lesion is 90% stenosed.   A drug-eluting stent was successfully placed using a SYNERGY XD [id number].   Post intervention, there is a 0% residual stenosis.   The left ventricular systolic function is normal.   LV end diastolic pressure is normal.   The left ventricular ejection fraction is 55-65% by visual estimate.   Single vessel obstructive CAD  involving the mid RCA Normal LV function Normal LVEDP Successful PCI of the mid RCA with DES x 1     Plan: DAPT for 6 months. Anticipate same day DC.    Diagnostic Dominance: Right  Intervention    _____________  EKG:  EKG is not  ordered today.   Recent Labs: 11/18/2021: ALT 15 11/24/2021: Hemoglobin 13.8; Platelets 242; TSH 1.340 12/08/2021: BUN 21; Creatinine, Ser 1.24; Potassium 4.5; Sodium 139  Recent Lipid Panel     Component Value Date/Time   CHOL 129 12/09/2021 1532   TRIG 134.0 12/09/2021 1532   HDL 44.00 12/09/2021 1532   CHOLHDL 3 12/09/2021 1532   VLDL 26.8 12/09/2021 1532   LDLCALC 58 12/09/2021 1532   LDLCALC 83 01/11/2020 1543   LDLDIRECT 74.0 05/22/2021 0755    Physical Exam:    VS:  BP 122/70   Pulse 86   Ht '5\' 8"'$  (1.727 m)   Wt 263 lb 3.2 oz (119.4 kg)   SpO2 95%   BMI 40.02 kg/m     Wt Readings from Last 3 Encounters:  12/11/21 263 lb 3.2 oz (119.4 kg)  12/09/21 259 lb 9.6 oz (117.8 kg)  12/01/21 255 lb (115.7 kg)     GEN:  Well nourished, well developed in no acute distress HEENT: Normal NECK: No JVD; No carotid bruits LYMPHATICS: No lymphadenopathy CARDIAC: RRR, no murmurs, rubs, gallops RESPIRATORY:  Clear to auscultation without rales, wheezing or rhonchi  ABDOMEN: Soft, non-tender, non-distended MUSCULOSKELETAL:  No edema; No deformity  SKIN: Warm and dry NEUROLOGIC:  Alert and oriented x 3 PSYCHIATRIC:  Normal affect   ASSESSMENT AND PLAN:    CAD s/p DES PCI to RCA with angina His chest pain improved after sublingual nitroglycerin 3 days ago.  His energy is improving.  Given diabetes, he could have microvascular angina.  Continue aspirin, Plavix and beta-blocker.  We will add Imdur 30 mg daily.  2. HTN -Blood pressure stable and controlled on current medication  3. HLD - Statin intolerance. On Livalo and fibrates. Lipid clinic referral given CAD despite LDL of 58. - 12/09/2021: Cholesterol 129; HDL 44.00; LDL Cholesterol 58; Triglycerides 134.0; VLDL 26.8   Medication Adjustments/Labs and Tests Ordered: Current medicines are reviewed at length with the patient today.  Concerns regarding medicines are outlined above.  No orders of the defined types were placed in this encounter.  Meds ordered this encounter  Medications   isosorbide mononitrate (IMDUR) 30 MG 24 hr tablet    Sig: Take 1 tablet (30 mg total) by mouth daily.    Dispense:  90 tablet     Refill:  1    There are no Patient Instructions on file for this visit.   Jarrett Soho, [place]  12/11/2021 3:45 PM    [facility]

## 2021-12-11 NOTE — Patient Instructions (Signed)
Medication Instructions:   START TAKING ISOSORBIDE MONONITRATE (IMDUR) 30 MG BY MOUTH DAILY  *If you need a refill on your cardiac medications before your next appointment, please call your pharmacy*   You have been referred to LIPID CLINIC TO SEE OUR PHARMACIST FOR MANAGEMENT OF YOUR LIPIDS   Follow-Up:  8 WEEKS WITH DR. Marlou Porch IN THE OFFICE   Important Information About Sugar

## 2021-12-11 NOTE — Telephone Encounter (Signed)
Returned call to patient. Patient states he would like Rx of Imdur sent to [facility], 30 day supply and then if he does well on it for a month he will continue to have Rx filled through [facility].  Imdur Rx sent to [facility] #30/no refills.

## 2021-12-19 ENCOUNTER — Other Ambulatory Visit (HOSPITAL_COMMUNITY): Payer: Self-pay

## 2021-12-22 ENCOUNTER — Other Ambulatory Visit: Payer: Self-pay | Admitting: Family Medicine

## 2021-12-29 ENCOUNTER — Other Ambulatory Visit: Payer: Self-pay

## 2021-12-29 MED ORDER — NITROGLYCERIN 0.4 MG SL SUBL: 0.4000 mg | SUBLINGUAL_TABLET | SUBLINGUAL | 11 refills | Status: DC | PRN

## 2022-01-07 ENCOUNTER — Emergency Department (HOSPITAL_COMMUNITY)
Admission: EM | Admit: 2022-01-07 | Discharge: 2022-01-07 | Disposition: A | Discharge status: Home / Self Care | Payer: Medicare Other | Attending: Emergency Medicine | Admitting: Emergency Medicine

## 2022-01-07 ENCOUNTER — Encounter (HOSPITAL_COMMUNITY): Payer: Self-pay | Admitting: Emergency Medicine

## 2022-01-07 ENCOUNTER — Emergency Department (HOSPITAL_COMMUNITY): Payer: Medicare Other

## 2022-01-07 DIAGNOSIS — R0602 Shortness of breath: Secondary | ICD-10-CM | POA: Diagnosis not present

## 2022-01-07 DIAGNOSIS — Z7901 Long term (current) use of anticoagulants: Secondary | ICD-10-CM | POA: Diagnosis not present

## 2022-01-07 DIAGNOSIS — Z794 Long term (current) use of insulin: Secondary | ICD-10-CM | POA: Diagnosis not present

## 2022-01-07 DIAGNOSIS — E119 Type 2 diabetes mellitus without complications: Secondary | ICD-10-CM | POA: Diagnosis not present

## 2022-01-07 DIAGNOSIS — R079 Chest pain, unspecified: Secondary | ICD-10-CM | POA: Diagnosis not present

## 2022-01-07 DIAGNOSIS — Z7982 Long term (current) use of aspirin: Secondary | ICD-10-CM | POA: Insufficient documentation

## 2022-01-07 DIAGNOSIS — Z79899 Other long term (current) drug therapy: Secondary | ICD-10-CM | POA: Insufficient documentation

## 2022-01-07 DIAGNOSIS — R002 Palpitations: Secondary | ICD-10-CM | POA: Diagnosis not present

## 2022-01-07 DIAGNOSIS — I10 Essential (primary) hypertension: Secondary | ICD-10-CM | POA: Insufficient documentation

## 2022-01-07 DIAGNOSIS — Z955 Presence of coronary angioplasty implant and graft: Secondary | ICD-10-CM | POA: Insufficient documentation

## 2022-01-07 DIAGNOSIS — R0789 Other chest pain: Secondary | ICD-10-CM | POA: Diagnosis not present

## 2022-01-07 LAB — BASIC METABOLIC PANEL
Anion gap: 10 (ref 5–15)
BUN: 19 mg/dL (ref 8–23)
CO2: 24 mmol/L (ref 22–32)
Calcium: 9.1 mg/dL (ref 8.9–10.3)
Chloride: 107 mmol/L (ref 98–111)
Creatinine, Ser: 1.28 mg/dL — ABNORMAL HIGH (ref 0.61–1.24)
GFR, Estimated: >60 mL/min (ref >60)
Glucose, Bld: 98 mg/dL (ref 70–99)
Potassium: 4.1 mmol/L (ref 3.5–5.1)
Sodium: 141 mmol/L (ref 135–145)

## 2022-01-07 LAB — CBC
HCT: 35.6 % — ABNORMAL LOW (ref 39.0–52.0)
Hemoglobin: 11.3 g/dL — ABNORMAL LOW (ref 13.0–17.0)
MCH: 29.7 pg (ref 26.0–34.0)
MCHC: 31.7 g/dL (ref 30.0–36.0)
MCV: 93.4 fL (ref 80.0–100.0)
Platelets: 197 10*3/uL (ref 150–400)
RBC: 3.81 MIL/uL — ABNORMAL LOW (ref 4.22–5.81)
RDW: 13.1 % (ref 11.5–15.5)
WBC: 4 10*3/uL (ref 4.0–10.5)
nRBC: 0 % (ref 0.0–0.2)

## 2022-01-07 LAB — TROPONIN I (HIGH SENSITIVITY)
Troponin I (High Sensitivity): 5 ng/L (ref <18)
Troponin I (High Sensitivity): 5 ng/L (ref <18)

## 2022-01-07 MED ORDER — METOPROLOL TARTRATE 25 MG PO TABS
100.0000 mg | ORAL_TABLET | Freq: Once | ORAL | Status: AC
  Administered 2022-01-07: 100 mg via ORAL
  Filled 2022-01-07: qty 4

## 2022-01-07 NOTE — ED Provider Triage Note (Signed)
Emergency Medicine Provider Triage Evaluation Note  Brent [name] , a 66 y.o. male  was evaluated in triage.  Pt complains of chest pain. Acute onset of L sided chest pain while driving earlier.  Pain has subsided.  No fever, chills, n/v, diaphoresis.  Was having some pain with breathing.    Review of Systems  Positive: As above Negative: As above  Physical Exam  There were no vitals taken for this visit. Gen:   Awake, no distress   Resp:  Normal effort  MSK:   Moves extremities without difficulty  Other:    Medical Decision Making  Medically screening exam initiated at 2:52 PM.  Appropriate orders placed.  [name] was informed that the remainder of the evaluation will be completed by another provider, this initial triage assessment does not replace that evaluation, and the importance of remaining in the ED until their evaluation is complete.     Domenic Moras, PA-C 01/07/22 2138

## 2022-01-07 NOTE — Discharge Instructions (Signed)
You were seen in the emergency department for evaluation of your chest pain.  Your EKG and labs were normal.  Your blood pressure still remains elevated.  After speaking with our cardiology team, they recommended increasing your Imdur from 30 mg to 60 mg.  I would like for you to try this.  Please make sure you are documenting your blood pressures multiple times throughout the day if not at least once daily.  Please make sure you call your cardiologist tomorrow to schedule an office appointment for follow-up.  If you would have any chest pain, shortness of breath, palpitations, please return to the nearest emergency department for reevaluation. If you have any concerns, new or worsening symptoms, please return to the ER.   Contact a doctor if: Your chest pain does not go away. You feel depressed. You have a fever. Get help right away if: Your chest pain is worse. You have a cough that gets worse, or you cough up blood. You have very bad (severe) pain in your belly (abdomen). You pass out (faint). You have either of these for no clear reason: Sudden chest discomfort. Sudden discomfort in your arms, back, neck, or jaw. You have shortness of breath at any time. You suddenly start to sweat, or your skin gets clammy. You feel sick to your stomach (nauseous). You throw up (vomit). You suddenly feel lightheaded or dizzy. You feel very weak or tired. Your heart starts to beat fast, or it feels like it is skipping beats. These symptoms may be an emergency. Do not wait to see if the symptoms will go away. Get medical help right away. Call your local emergency services (911 in the [place] Do not drive yourself to the hospital.

## 2022-01-07 NOTE — ED Triage Notes (Signed)
Patietn complains of sudden onset of chest pain lasting approximately one hour that started suddenly at 1230 while driving. Pain described as sharp, denies shortness of breath, denies dizziness, denies nausea. Patient on plavix after a stent placement. Patient took 4x SL NTG with no change in pain.  BP 130/80 HR 84 RR 16

## 2022-01-07 NOTE — ED Provider Notes (Signed)
[facility] EMERGENCY DEPARTMENT Provider Note   CSN: [id number] Arrival date & time: 01/07/22  1439     History Chief Complaint  Patient presents with   Chest Pain    Brent [name] is a 66 y.o. male with h/o diabetes, hypertension, stent placement on 11-28-2021 presents the emergency department for evaluation of central to left-sided chest pain since 1230 today.  He reports he tried sublingual nitro x3 every 5 to 10 minutes without relief of chest pain.  He reports he called EMS and got a fourth dose.  He reports the fourth dose helped some.  Since then, its been waxing and waning.  He reports some shortness of breath in addition to some palpitations.  He denies any nausea, diaphoresis, or any lightheadedness.  Denies any abdominal pain, fever, cough.  Denies any leg swelling.  Here denies any abdominal swelling as well.  He is currently on Plavix, daily nitroglycerin, metoprolol, and losartan.   Chest Pain Associated symptoms: palpitations and shortness of breath   Associated symptoms: no abdominal pain, no back pain, no dizziness, no fever, no nausea, no vomiting and no weakness        Home Medications Prior to Admission medications   Medication Sig Start Date End Date Taking? Authorizing Provider  acetaminophen (TYLENOL) 500 MG tablet Take 1,000 mg by mouth every 6 (six) hours as needed for moderate pain or headache.    [provider]  amitriptyline (ELAVIL) 50 MG tablet TAKE 1 TABLET AT BEDTIME 07/03/21   [name], MD  amoxicillin (AMOXIL) 500 MG capsule Take 2,000 mg by mouth See admin instructions.  TAKE 4 CAPSULES BY MOUTH PRIOR TO APPOINTMENT    [provider]  aspirin EC 81 MG tablet Take 1 tablet (81 mg total) by mouth daily. Swallow whole. 11/24/21   Finis Bud, NP  Cholecalciferol (VITAMIN D) 50 MCG (2000 UT) tablet Take 6,000 Units by mouth daily.    [provider]  clopidogrel (PLAVIX) 75 MG tablet Take 1 tablet  (75 mg total) by mouth daily with breakfast. 11/29/21   Cheryln Manly, NP  Cyanocobalamin (VITAMIN B-12) 1000 MCG SUBL Place 1,000 mcg under the tongue 2 (two) times a week.    [provider]  Dulaglutide (TRULICITY) 1.5 XT/0.6YI SOPN Inject 1.5 mg into the skin every Friday. Trulicity 1.5 RS/8.5 mL subcutaneous pen injector    [provider]  econazole nitrate 1 % cream Apply topically daily. 10/06/21   Debbrah Alar, NP  EPINEPHrine 0.3 mg/0.3 mL IJ SOAJ injection INJECT 0.3 MLS (0.3 MG TOTAL) INTO THE MUSCLE ONCE FOR 1 DOSE 10/06/21   Debbrah Alar, NP  famotidine (PEPCID) 40 MG tablet TAKE 1 TABLET AT BEDTIME AS NEEDED FOR HEARTBURN OR INDIGESTION 10/09/21   [name], MD  fenofibrate 160 MG tablet Take 1 tablet (160 mg total) by mouth daily. 12/23/21   [name], MD  Ferrous Gluconate-C-Folic Acid (IRON-C PO) Take 1 tablet by mouth daily. Iron 65 mg and vit C-125 mg    [provider]  fluticasone (FLONASE) 50 MCG/ACT nasal spray USE 2 SPRAYS IN EACH NOSTRIL DAILY 12/10/21   [name], MD  gabapentin (NEURONTIN) 300 MG capsule TAKE 1 CAPSULE TWICE A DAY AND 2 CAPSULES AT BEDTIME 10/08/21   [name], MD  glucose blood (FREESTYLE LITE) test strip Use to check blood sugar 4 times day.  Dx Code: E11.9 09/10/20   [name], MD  glyBURIDE (DIABETA) 5 MG tablet Take 0.5 tablets (2.5 mg total) by mouth daily. 12/10/21   [name], MD  hydrochlorothiazide (MICROZIDE) 12.5 MG capsule Take 1 capsule (12.5 mg total) by mouth daily. 11/24/21   Finis Bud, NP  insulin lispro (HUMALOG) 100 UNIT/ML KwikPen Inject 2-6 Units into the skin 3 (three) times daily. Per sliding scale. 10/31/20   [name], MD  Insulin Pen Needle (PEN NEEDLES 31GX5/16") 31G X 8 MM MISC Use as directed with Humalog 3 times a day and at bedtime 09/09/20   [name], MD  isosorbide mononitrate (IMDUR) 30 MG 24 hr tablet Take 1 tablet (30 mg total) by  mouth daily. 12/11/21   Bhagat, Crista Luria, PA  losartan (COZAAR) 50 MG tablet TAKE 1 TABLET DAILY (KEEP UPCOMING APPOINTMENT IN MARCH 2023 WITH CARDIOLOGIST BEFORE ANYMORE REFILLS) 09/08/21   Jerline Pain, MD  Magnesium 100 MG CAPS Take 1 capsule (100 mg total) by mouth daily. 06/29/17   [name], MD  metoprolol tartrate (LOPRESSOR) 100 MG tablet Take 1 tablet (100 mg total) by mouth 2 (two) times daily. 08/07/21   Jerline Pain, MD  mometasone (ELOCON) 0.1 % cream Apply 1 Application topically daily. 10/06/21   Debbrah Alar, NP  Multiple Vitamin (MULTIVITAMIN) tablet Take 1 tablet by mouth daily.    [provider]  nitroGLYCERIN (NITROSTAT) 0.4 MG SL tablet Place 1 tablet (0.4 mg total) under the tongue every 5 (five) minutes as needed. 12/29/21   Jerline Pain, MD  oxycodone (OXY-IR) 5 MG capsule Take 5 mg by mouth 3 (three) times daily as needed for pain.    [provider]  pantoprazole (PROTONIX) 40 MG tablet TAKE 1 TABLET TWICE A DAY BEFORE MEALS 03/17/21   [name], MD  Pitavastatin Calcium (LIVALO) 2 MG TABS Take 1 tablet (2 mg total) by mouth daily. 11/24/21   [name], MD  sodium chloride (OCEAN) 0.65 % SOLN nasal spray Place 1 spray into both nostrils as needed for congestion (nose irritation). 07/08/18   Swayze, Ava, DO      Allergies    Bee venom, Lipitor [atorvastatin], Morphine, Metformin and related, Robaxin [methocarbamol], Baclofen, Medrol [methylprednisolone], Simvastatin, Hydrocodone, and Pravastatin    Review of Systems   Review of Systems  Constitutional:  Negative for chills and fever.  Respiratory:  Positive for shortness of breath.   Cardiovascular:  Positive for chest pain and palpitations. Negative for leg swelling.  Gastrointestinal:  Negative for abdominal pain, constipation, diarrhea, nausea and vomiting.  Genitourinary:  Negative for dysuria and hematuria.  Musculoskeletal:  Negative for back pain.  Neurological:   Negative for dizziness, weakness and light-headedness.    Physical Exam Updated Vital Signs BP (!) 173/90   Pulse 75   Temp 97.9 F (36.6 C) (Oral)   Resp 14   SpO2 100%  Physical Exam Vitals and nursing note reviewed.  Constitutional:      General: He is not in acute distress.    Appearance: Normal appearance. He is not ill-appearing, toxic-appearing or diaphoretic.  HENT:     Head: Normocephalic and atraumatic.  Eyes:     General: No scleral icterus. Cardiovascular:     Rate and Rhythm: Normal rate and regular rhythm.     Pulses:          Radial pulses are 2+ on the right side and 2+ on the left side.  Pulmonary:     Effort: Pulmonary effort is  normal. No accessory muscle usage or respiratory distress.     Breath sounds: Normal breath sounds.  Chest:     Chest wall: No tenderness or crepitus.  Abdominal:     General: Abdomen is flat. Bowel sounds are normal.     Palpations: Abdomen is soft.     Comments: Abdominal exam limited secondary to body habitus.  Abdomen is distended however is soft.  Patient reports this is baseline.  Nontender to palpation.  Musculoskeletal:        General: No deformity.     Cervical back: Normal range of motion.  Skin:    General: Skin is warm and dry.  Neurological:     General: No focal deficit present.     Mental Status: He is alert. Mental status is at baseline.     ED Results / Procedures / Treatments   Labs (all labs ordered are listed, but only abnormal results are displayed) Labs Reviewed  BASIC METABOLIC PANEL - Abnormal; Notable for the following components:      Result Value   Creatinine, Ser 1.28 (*)    All other components within normal limits  CBC - Abnormal; Notable for the following components:   RBC 3.81 (*)    Hemoglobin 11.3 (*)    HCT 35.6 (*)    All other components within normal limits  TROPONIN I (HIGH SENSITIVITY)  TROPONIN I (HIGH SENSITIVITY)    EKG EKG Interpretation  Date/Time:  Wednesday  January 07 2022 14:57:21 EDT Ventricular Rate:  80 PR Interval:  130 QRS Duration: 68 QT Interval:  390 QTC Calculation: 449 R Axis:   16 Text Interpretation: Normal sinus rhythm Normal ECG When compared with ECG of 28-Nov-2021 08:39, PREVIOUS ECG IS PRESENT No significant change since last tracing Confirmed by Isla Pence [id number]) on 01/07/2022 6:57:17 PM  Radiology DG Chest 2 View  Result Date: 01/07/2022 CLINICAL DATA:  Chest pain. EXAM: CHEST - 2 VIEW COMPARISON:  December 21, 2011. FINDINGS: The heart size and mediastinal contours are within normal limits. Both lungs are clear. The visualized skeletal structures are unremarkable. IMPRESSION: No active cardiopulmonary disease. Electronically Signed   By: Marijo Conception M.D.   On: 01/07/2022 16:37    Procedures Procedures   Medications Ordered in ED Medications - No data to display  ED Course/ Medical Decision Making/ A&P                           Medical Decision Making Amount and/or Complexity of Data Reviewed Labs: ordered. Radiology: ordered.   ***  66 year old male presents the emergency department for evaluation of waxing waning chest pain since today at 1230.  Differential diagnosis includes was limited to ACS, arrhythmia, pneumonia, pneumothorax, PE, dissection, aneurysm.  Vital signs show elevated blood pressure 173/90 otherwise unremarkable.  Physical exam as noted above.  Labs and imaging ordered in triage.  I independently reviewed and interpreted the patient's labs.  BMP shows creatinine of 1.28 although only slightly increased from his previous around 1.24.  No other electrolyte abnormalities.  CBC shows mild decrease in hemoglobin 11.3 but again appears to be around patient's baseline over the past few months as it ranges.  Leukocytosis.  Normal platelets.    Chest x-ray shows no active cardiopulmonary process.  EKG reviewed and interpreted by my attending and read as normal sinus rhythm.  Patient  reports that he was having chest pain again.  New EKG shot.  Patient  still in sinus rhythm.  He reports at this time it feels more like a burning pain.  Rates a 4 out of 10.  Reports is radiating down his left arm.    {Document critical care time when appropriate:1} {Document review of labs and clinical decision tools ie heart score, Chads2Vasc2 etc:1}  {Document your independent review of radiology images, and any outside records:1} {Document your discussion with family members, caretakers, and with consultants:1} {Document social determinants of health affecting pt's care:1} {Document your decision making why or why not admission, treatments were needed:1} Final Clinical Impression(s) / ED Diagnoses Final diagnoses:  None    Rx / DC Orders ED Discharge Orders     None

## 2022-01-08 ENCOUNTER — Telehealth: Payer: Self-pay | Admitting: Cardiology

## 2022-01-08 ENCOUNTER — Encounter: Payer: Self-pay | Admitting: Cardiology

## 2022-01-08 NOTE — Telephone Encounter (Signed)
Scheduler advised not necessary to speak with pt unless he is having a new problem.  Pt was evaluated in ED 01/07/2022 for CP and cardiology was consulted.  Have responded to pt through his MyChart message.  See encounter dated 01/08/2022 for complete details.

## 2022-01-08 NOTE — Telephone Encounter (Signed)
Pt c/o of Chest Pain: STAT if CP now or developed within 24 hours  1. Are you having CP right now? no  2. Are you experiencing any other symptoms (ex. SOB, nausea, vomiting, sweating)? no  3. How long have you been experiencing CP? Yesterday   4. Is your CP continuous or coming and going? Coming and going   5. Have you taken Nitroglycerin? Took two back to back but was no release. Patient ended up calling the ambulance went to the hospital, ran all test but couldn't find anything. Told patient to f/u with the dr.  ?

## 2022-01-12 DIAGNOSIS — Z8551 Personal history of malignant neoplasm of bladder: Secondary | ICD-10-CM | POA: Diagnosis not present

## 2022-01-13 ENCOUNTER — Ambulatory Visit (INDEPENDENT_AMBULATORY_CARE_PROVIDER_SITE_OTHER): Payer: Medicare Other | Admitting: Pharmacist

## 2022-01-13 ENCOUNTER — Ambulatory Visit: Payer: Medicare Other

## 2022-01-13 ENCOUNTER — Encounter: Payer: Self-pay | Admitting: Cardiology

## 2022-01-13 ENCOUNTER — Ambulatory Visit: Payer: Medicare Other | Attending: Cardiology | Admitting: Cardiology

## 2022-01-13 VITALS — BP 110/60 | HR 75 | Ht 68.0 in | Wt 261.0 lb

## 2022-01-13 DIAGNOSIS — E782 Mixed hyperlipidemia: Secondary | ICD-10-CM

## 2022-01-13 DIAGNOSIS — E785 Hyperlipidemia, unspecified: Secondary | ICD-10-CM | POA: Diagnosis not present

## 2022-01-13 DIAGNOSIS — I2510 Atherosclerotic heart disease of native coronary artery without angina pectoris: Secondary | ICD-10-CM

## 2022-01-13 DIAGNOSIS — I10 Essential (primary) hypertension: Secondary | ICD-10-CM | POA: Diagnosis not present

## 2022-01-13 DIAGNOSIS — I25110 Atherosclerotic heart disease of native coronary artery with unstable angina pectoris: Secondary | ICD-10-CM

## 2022-01-13 MED ORDER — EZETIMIBE 10 MG PO TABS: 10.0000 mg | ORAL_TABLET | Freq: Every day | ORAL | 0 refills | Status: DC

## 2022-01-13 NOTE — Patient Instructions (Signed)
Medication Instructions:  The current medical regimen is effective;  continue present plan and medications.  *If you need a refill on your cardiac medications before your next appointment, please call your pharmacy*  Follow-Up: At [facility], you and your health needs are our priority.  As part of our continuing mission to provide you with exceptional heart care, we have created designated Provider Care Teams.  These Care Teams include your primary Cardiologist (physician) and Advanced Practice Providers (APPs -  Physician Assistants and Nurse Practitioners) who all work together to provide you with the care you need, when you need it.  We recommend signing up for the patient portal called "MyChart".  Sign up information is provided on this After Visit Summary.  MyChart is used to connect with patients for Virtual Visits (Telemedicine).  Patients are able to view lab/test results, encounter notes, upcoming appointments, etc.  Non-urgent messages can be sent to your provider as well.   To learn more about what you can do with MyChart, go to [email or web address].    Your next appointment:   6 month(s)  The format for your next appointment:   In Person  Provider:   Tessa Conte, PA-C, Dayna Dunn, PA-C, Ernest Dick, NP, Michele Lenze, PA-C, Michelle Swinyer, NP, or Scott Weaver, PA-C     Then, Mark Skains, MD will plan to see you again in 1 year(s).    Important Information About Sugar       

## 2022-01-13 NOTE — Progress Notes (Signed)
Cardiology Office Note:    Date:  01/13/2022   ID:  Brent, [name] 1955-09-07, MRN [id number]  PCP:  Brent Lukes, MD   [place]  Cardiologist:  Brent Furbish, MD  Advanced Practice Provider:  No care team member to display Electrophysiologist:  None       Referring MD: Brent Lukes, MD     History of Present Illness:    Brent [name] is a 66 y.o. male here for the follow-up after ED visit for Chest Pain  In 2012 had gastric bypass.  Was able to come off of quite a few medications.    In 2013 had cardiac catheterization that showed only moderate disease In 2023 had cardiac catheterization that showed severe RCA disease that was stented.  Despite most recent nuclear stress test 07/2021 low risk, on 11/28/2021 he had a left heart catheterization and a stent placed to RCA.  Other vessels were mild to moderate in severity.  Nonflow-limiting.    He was seen in the ED on 01/07/2022 for evaluation of central to left-sided chest pain. He reported taking 3 sublingual does of nitroglycerin and did not have symptom improvement until EMS administered a fourth dose. He experienced shortness of breath and palpitations with the chest pain. Labs in hospital showed no significant deviations from his baseline and his chest pain continued to improve. He was discharged with instructions to follow up with this office.   Today, He reports he has been feeling well since his ED visit. He increased his Imdur from 30-60 mg. He is taking one Imdur 30 mg PO in the morning and one 30 mg in the evening. He denies any chest pain since increasing this dose. He is still taking his Plavix and a aspirin daily. He describes the pain that he had as hurting more toward the lateral left of his chest toward his arm. The pain was not constant, it was only when he inhaled. He had gone to pick up his grandson and gone to the grocery store. He took a nitroglycerin tablet every10 minutes as  instructed. After 3 tablets it did not improve and he called EMS, who administered another nitroglycerin tablet while he was being transported. His blood pressure when they arrived was 210/110. In the office today his blood pressure was normal, 110/60.  He denies any palpitations, chest pain, shortness of breath, or peripheral edema. No lightheadedness, headaches, syncope, orthopnea, or PND.    Past Medical History:  Diagnosis Date   Acid reflux disease    ACID REFLUX DISEASE 07/05/2007   Acute gastric ulcer with bleeding    Alcohol abuse    Anemia    Atherosclerosis    Benign neoplasm of colon 07/05/2007   Bilateral hip pain 10/05/2016   Bladder cancer ([place])    Breast pain, left 11/17/2011   CAD (coronary artery disease)    Chest pain    a. Reportedly negative dobut echo performed prior to gastric bypass in 03/2011;  b. 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 12/2011 Cath: LM nl, LAD 50p, D1 73m LCX min irregs, OM3 30, RCA 25p, 743mFFR 0.99->0.89), PDA 30, EF 65%, Med Rx. d. Cath 11/2021 - 90% [name] RCA s/p PCI/DES x1   COLONIC POLYPS, HX OF 10/29/2008   Diarrhea 06/13/2010   Diverticulosis 07/05/2018   DIVERTICULOSIS, COLON 10/29/2008   DM (diabetes mellitus), type 2, uncontrolled    GI bleed    Gout 03/04/2013  Hearing loss 05/24/2013   Previous audiology evaluation completely.   HTN (hypertension) 07/14/2010   Hx of colonic polyps    HYPERSOMNIA, ASSOCIATED WITH SLEEP APNEA 07/26/2008   Hypertension 07/14/2010   Impotence of organic origin 07/05/2007   Internal hemorrhoids    Knee pain, left 10/10/2010   Morbid obesity ([place]) 03/27/2010   a. 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   Neck pain 03/2015   Other and unspecified hyperlipidemia 11/16/2012   Post-operative nausea and vomiting    after the surgery in hospital in March 2020/ past the cauderization   Preventative health care 11/17/2011   Renal stone 11/2013   Sleep apnea    a. CPAP   Spinal stenosis    Tear of meniscus of left  knee 2012    Past Surgical History:  Procedure Laterality Date   ANKLE SURGERY Right 1994   BICEPS TENDON REPAIR     left side   CARDIAC CATHETERIZATION     denies any chest pain in the past 2 years   COLONOSCOPY     colonoscopy polyps     CORONARY STENT INTERVENTION N/A 11/28/2021   Procedure: CORONARY STENT INTERVENTION;  Surgeon: Martinique, Peter M, MD;  Location: [facility];  Service: Cardiovascular;  Laterality: N/A;   ESOPHAGOGASTRODUODENOSCOPY N/A 03/27/2013   Procedure: ESOPHAGOGASTRODUODENOSCOPY (EGD);  Surgeon: Brent Shipper, MD;  Location: [facility];  Service: Endoscopy;  Laterality: N/A;   ESOPHAGOGASTRODUODENOSCOPY (EGD) WITH PROPOFOL N/A 07/06/2018   Procedure: ESOPHAGOGASTRODUODENOSCOPY (EGD) WITH PROPOFOL;  Surgeon: Brent Bears, MD;  Location: [facility];  Service: Gastroenterology;  Laterality: N/A;   GASTRIC BYPASS     HERNIA REPAIR     HOT HEMOSTASIS N/A 07/06/2018   Procedure: HOT HEMOSTASIS (ARGON PLASMA COAGULATION/BICAP);  Surgeon: Brent Bears, MD;  Location: [facility];  Service: Gastroenterology;  Laterality: N/A;   KNEE ARTHROSCOPY Left 11/06/2010   Left, torn meniscus (repaired)   LEFT HEART CATH AND CORONARY ANGIOGRAPHY N/A 11/28/2021   Procedure: LEFT HEART CATH AND CORONARY ANGIOGRAPHY;  Surgeon: Martinique, Peter M, MD;  Location: [facility];  Service: Cardiovascular;  Laterality: N/A;   LEFT HEART CATHETERIZATION WITH CORONARY ANGIOGRAM N/A 12/23/2011   Procedure: LEFT HEART CATHETERIZATION WITH CORONARY ANGIOGRAM;  Surgeon: Brent Breeding, MD;  Location: [facility];  Service: Cardiovascular;  Laterality: N/A;   REPLACEMENT TOTAL KNEE Right 01/2016   removed scar tissue/ cut tip of nerve bundle and re-route   right knee arthroscopy Right 07/05/14   Dr. Hart [name], [place].   ROTATOR CUFF REPAIR  2019   left   SCHLEROTHERAPY  07/06/2018   Procedure: SCHLEROTHERAPY;  Surgeon: Brent Bears, MD;  Location: [facility];  Service:  Gastroenterology;;   TONSILLECTOMY  age 20   TONSILLECTOMY     as a child   TOTAL KNEE ARTHROPLASTY Right 01/20/2016   Procedure: RIGHT TOTAL KNEE ARTHROPLASTY;  Surgeon: Paralee Cancel, MD;  Location: [facility];  Service: Orthopedics;  Laterality: Right;   TRANSURETHRAL RESECTION OF BLADDER TUMOR N/A 09/24/2020   Procedure: TRANSURETHRAL RESECTION OF BLADDER TUMOR (TURBT)  RIGHT retrograde pylegram;  Surgeon: [name], MD;  Location: [facility];  Service: Urology;  Laterality: N/A;   UPPER GI ENDOSCOPY  03/27/13    Current Medications: Current Meds  Medication Sig   acetaminophen (TYLENOL) 500 MG tablet Take 1,000 mg by mouth every 6 (six) hours as needed for moderate pain or headache.   amitriptyline (ELAVIL) 50 MG tablet TAKE 1 TABLET AT BEDTIME  amoxicillin (AMOXIL) 500 MG capsule Take 2,000 mg by mouth See admin instructions.  TAKE 4 CAPSULES BY MOUTH PRIOR TO APPOINTMENT   aspirin EC 81 MG tablet Take 1 tablet (81 mg total) by mouth daily. Swallow whole.   Cholecalciferol (VITAMIN D) 50 MCG (2000 UT) tablet Take 6,000 Units by mouth daily.   clopidogrel (PLAVIX) 75 MG tablet Take 1 tablet (75 mg total) by mouth daily with breakfast.   Cyanocobalamin (VITAMIN B-12) 1000 MCG SUBL Place 1,000 mcg under the tongue 2 (two) times a week.   Dulaglutide (TRULICITY) 1.5 VQ/9.4HW SOPN Inject 1.5 mg into the skin every Friday. Trulicity 1.5 TU/8.8 mL subcutaneous pen injector   econazole nitrate 1 % cream Apply topically daily.   EPINEPHrine 0.3 mg/0.3 mL IJ SOAJ injection INJECT 0.3 MLS (0.3 MG TOTAL) INTO THE MUSCLE ONCE FOR 1 DOSE   famotidine (PEPCID) 40 MG tablet TAKE 1 TABLET AT BEDTIME AS NEEDED FOR HEARTBURN OR INDIGESTION   fenofibrate 160 MG tablet Take 1 tablet (160 mg total) by mouth daily.   Ferrous Gluconate-C-Folic Acid (IRON-C PO) Take 1 tablet by mouth daily. Iron 65 mg and vit C-125 mg   fluticasone (FLONASE) 50 MCG/ACT nasal spray USE 2 SPRAYS IN EACH NOSTRIL DAILY   gabapentin  (NEURONTIN) 300 MG capsule TAKE 1 CAPSULE TWICE A DAY AND 2 CAPSULES AT BEDTIME   glucose blood (FREESTYLE LITE) test strip Use to check blood sugar 4 times day.  Dx Code: E11.9   hydrochlorothiazide (MICROZIDE) 12.5 MG capsule Take 1 capsule (12.5 mg total) by mouth daily.   insulin lispro (HUMALOG) 100 UNIT/ML KwikPen Inject 2-6 Units into the skin 3 (three) times daily. Per sliding scale.   Insulin Pen Needle (PEN NEEDLES 31GX5/16") 31G X 8 MM MISC Use as directed with Humalog 3 times a day and at bedtime   isosorbide mononitrate (IMDUR) 30 MG 24 hr tablet Take 30 mg by mouth 2 (two) times daily.   losartan (COZAAR) 50 MG tablet TAKE 1 TABLET DAILY (KEEP UPCOMING APPOINTMENT IN MARCH 2023 WITH CARDIOLOGIST BEFORE ANYMORE REFILLS)   Magnesium 100 MG CAPS Take 1 capsule (100 mg total) by mouth daily.   metoprolol tartrate (LOPRESSOR) 100 MG tablet Take 1 tablet (100 mg total) by mouth 2 (two) times daily.   mometasone (ELOCON) 0.1 % cream Apply 1 Application topically daily.   Multiple Vitamin (MULTIVITAMIN) tablet Take 1 tablet by mouth daily.   nitroGLYCERIN (NITROSTAT) 0.4 MG SL tablet Place 1 tablet (0.4 mg total) under the tongue every 5 (five) minutes as needed.   oxycodone (OXY-IR) 5 MG capsule Take 5 mg by mouth 3 (three) times daily as needed for pain.   pantoprazole (PROTONIX) 40 MG tablet TAKE 1 TABLET TWICE A DAY BEFORE MEALS   Pitavastatin Calcium (LIVALO) 2 MG TABS Take 1 tablet (2 mg total) by mouth daily.   sodium chloride (OCEAN) 0.65 % SOLN nasal spray Place 1 spray into both nostrils as needed for congestion (nose irritation).   [DISCONTINUED] isosorbide mononitrate (IMDUR) 30 MG 24 hr tablet Take 1 tablet (30 mg total) by mouth daily. (Patient taking differently: Take 30 mg by mouth 2 (two) times daily.)     Allergies:   Bee venom, Lipitor [atorvastatin], Morphine, Metformin and related, Robaxin [methocarbamol], Baclofen, Medrol [methylprednisolone], Simvastatin, Hydrocodone,  and Pravastatin   Social History   Socioeconomic History   Marital status: Married    Spouse name: Not on file   Number of children: 2   Years  of education: Not on file   Highest education level: Not on file  Occupational History   Occupation: TEACHER    Employer: [employer]  Tobacco Use   Smoking status: Former    Packs/day: 1.50    Years: 20.00    Total pack years: 30.00    Types: Cigarettes    Quit date: 04/21/1991    Years since quitting: 30.7   Smokeless tobacco: Never  Vaping Use   Vaping Use: Never used  Substance and Sexual Activity   Alcohol use: Yes    Alcohol/week: 3.0 standard drinks of alcohol    Types: 3 Cans of beer per week    Comment: weekly   Drug use: No   Sexual activity: Not on file  Other Topics Concern   Not on file  Social History Narrative   Lives with wife in [place].  Does not routinely exercise.   Social Determinants of Health   Financial Resource Strain: Low Risk  (12/01/2021)   Overall Financial Resource Strain (CARDIA)    Difficulty of Paying Living Expenses: Not hard at all  Food Insecurity: No Food Insecurity (12/01/2021)   Hunger Vital Sign    Worried About Running Out of Food in the Last Year: Never true    Ran Out of Food in the Last Year: Never true  Transportation Needs: No Transportation Needs (12/01/2021)   PRAPARE - Hydrologist (Medical): No    Lack of Transportation (Non-Medical): No  Physical Activity: Inactive (12/01/2021)   Exercise Vital Sign    Days of Exercise per Week: 0 days    Minutes of Exercise per Session: 0 min  Stress: No Stress Concern Present (12/01/2021)   [place]    Feeling of Stress : Not at all  Social Connections: [place] (12/01/2021)   Social Connection and Isolation Panel [NHANES]    Frequency of Communication with Friends and Family: More than three times a week    Frequency of  Social Gatherings with Friends and Family: More than three times a week    Attends Religious Services: More than 4 times per year    Active Member of [employer] or Organizations: Yes    Attends Music therapist: More than 4 times per year    Marital Status: Married     Family History: The patient's family history includes ADD / ADHD in his daughter; Colon polyps in his father; Diabetes in his brother, brother, brother, mother, and sister; Heart attack in his brother and brother; Heart attack (age of onset: 67) in his father; Heart disease in his brother, brother, and brother; Hyperlipidemia in his mother; Hypertension in his father, maternal grandmother, and mother; Obesity in his brother; Stroke in his father, mother, and sister. There is no history of Stomach cancer, Colon cancer, Esophageal cancer, or Rectal cancer.  ROS:   Please see the history of present illness.    All other systems reviewed and are negative.  EKGs/Labs/Other Studies Reviewed:    The following studies were reviewed today:  ECHO 12/08/2021:  IMPRESSIONS     1. Left ventricular ejection fraction, by estimation, is 55 to 60%. The  left ventricle has normal function. The left ventricle has no regional  wall motion abnormalities. Left ventricular diastolic parameters are  indeterminate.   2. Right ventricular systolic function is normal. The right ventricular  size is normal. There is normal pulmonary artery systolic pressure. The  estimated right ventricular systolic pressure is 38.1 mmHg.   3. The mitral valve is grossly normal. Mild mitral valve regurgitation.  No evidence of mitral stenosis.   4. The aortic valve is tricuspid. There is mild calcification of the  aortic valve. Aortic valve regurgitation is mild. Aortic valve sclerosis  is present, with no evidence of aortic valve stenosis.   5. The inferior vena cava is normal in size with greater than 50%  respiratory variability, suggesting right  atrial pressure of 3 mmHg.   L Heart Cath and Coronary Angiography 11/28/2021:   Mid LAD lesion is 40% stenosed.   1st Diag lesion is 40% stenosed.   Mid Cx to Dist Cx lesion is 40% stenosed.   Prox RCA to Mid RCA lesion is 90% stenosed.   A drug-eluting stent was successfully placed using a SYNERGY XD 4.0X16.   Post intervention, there is a 0% residual stenosis.   The left ventricular systolic function is normal.   LV end diastolic pressure is normal.   The left ventricular ejection fraction is 55-65% by visual estimate.   Single vessel obstructive CAD involving the mid RCA Normal LV function Normal LVEDP Successful PCI of the mid RCA with DES x 1  Nuclear Stress Test 07/25/2021:  The study is normal. The study is low risk.   No ST deviation was noted.   Left ventricular function is normal. Nuclear stress EF: 62 %. The left ventricular ejection fraction is normal (55-65%). End diastolic cavity size is normal.   Prior study available for comparison from 05/25/2014.    EKG: EKG is personally reviewed.  01/13/2022: EKG was not ordered.  07/05/2020: The ekg ordered today demonstrates sinus rhythm 70 bpm.  Recent Labs: 11/18/2021: ALT 15 11/24/2021: TSH 1.340 01/07/2022: BUN 19; Creatinine, Ser 1.28; Hemoglobin 11.3; Platelets 197; Potassium 4.1; Sodium 141  Recent Lipid Panel    Component Value Date/Time   CHOL 129 12/09/2021 1532   TRIG 134.0 12/09/2021 1532   HDL 44.00 12/09/2021 1532   CHOLHDL 3 12/09/2021 1532   VLDL 26.8 12/09/2021 1532   LDLCALC 58 12/09/2021 1532   LDLCALC 83 01/11/2020 1543   LDLDIRECT 74.0 05/22/2021 0755     Risk Assessment/Calculations:      Physical Exam:    VS:  BP 110/60 (BP Location: Left Arm, Patient Position: Sitting, Cuff Size: Normal)   Pulse 75   Ht '5\' 8"'$  (1.727 [name])   Wt 261 lb (118.4 kg)   SpO2 95%   BMI 39.68 kg/[name]     Wt Readings from Last 3 Encounters:  01/13/22 261 lb (118.4 kg)  01/07/22 255 lb (115.7 kg)  12/11/21 263 lb 3.2  oz (119.4 kg)     GEN:  Well nourished, well developed in no acute distress HEENT: Normal NECK: No JVD; No carotid bruits LYMPHATICS: No lymphadenopathy CARDIAC: RRR, no murmurs, rubs, gallops RESPIRATORY:  Clear to auscultation without rales, wheezing or rhonchi  ABDOMEN: Soft, non-tender, non-distended MUSCULOSKELETAL:  No edema; No deformity  SKIN: Warm and dry NEUROLOGIC:  Alert and oriented x 3 PSYCHIATRIC:  Normal affect   ASSESSMENT:    1. Coronary artery disease involving native coronary artery of native heart with unstable angina pectoris ([place])   2. Essential hypertension   3. Hyperlipidemia LDL goal <70     PLAN:    In order of problems listed above:  Coronary artery disease/angina -Fairly atypical symptoms with worsening left-sided chest discomfort with deep inspiration that prompted ER visit 12/2021 less  than a month after his stent placement after 3 nitroglycerin did not relieve discomfort.  Previous discomfort prior to stent placement was centralized chest discomfort.  ER visit completely reviewed, data reviewed troponins were normal EKG showed no ischemic changes.  Excellent. -RCA stent August 2023 with good diabetes and hypertension control as well as Livalo statin.  Diabetes with hypertension difficult to control with palpitations.  Seeing hyperlipidemia clinic today.   Palpitations -Doing much better with the metoprolol increased to 100 twice a day.  Doing well.  EKG stable.  Hyperlipidemia -On Livalo as well as fibrate.  Doing well.  LDL goal is less than 70.  At last check 83.  Continue with diet and exercise as well.  Morbid obesity -BMI 39 with 2 or more comorbidities.  Continue to encourage exercise.  Decrease carbohydrates.  No changes made.  Follow Up: 6 month follow up with APP   Medication Adjustments/Labs and Tests Ordered: Current medicines are reviewed at length with the patient today.  Concerns regarding medicines are outlined above.  No  orders of the defined types were placed in this encounter.  No orders of the defined types were placed in this encounter.   Patient Instructions  Medication Instructions:  The current medical regimen is effective;  continue present plan and medications.  *If you need a refill on your cardiac medications before your next appointment, please call your pharmacy*   Follow-Up: At [facility], you and your health needs are our priority.  As part of our continuing mission to provide you with exceptional heart care, we have created designated Provider Care Teams.  These Care Teams include your primary Cardiologist (physician) and Advanced Practice Providers (APPs -  Physician Assistants and Nurse Practitioners) who all work together to provide you with the care you need, when you need it.  We recommend signing up for the patient portal called "MyChart".  Sign up information is provided on this After Visit Summary.  MyChart is used to connect with patients for Virtual Visits (Telemedicine).  Patients are able to view lab/test results, encounter notes, upcoming appointments, etc.  Non-urgent messages can be sent to your provider as well.   To learn more about what you can do with MyChart, go to [email or web address].    Your next appointment:   6 month(s)  The format for your next appointment:   In Person  Provider:   Nicholes Rough, PA-C, Melina Copa, PA-C, Ambrose Pancoast, NP, Ermalinda Barrios, PA-C, Christen Bame, NP, or Richardson Dopp, PA-C     Then, Brent Furbish, MD will plan to see you again in 1 year(s).     Important Information About Sugar          I,Jessica Ford,acting as a scribe for [employer], MD.,have documented all relevant documentation on the behalf of Brent Furbish, MD,as directed by  Brent Furbish, MD while in the presence of Brent Furbish, MD.   I, Brent Furbish, MD, have reviewed all documentation for this visit. The documentation on 01/13/22 for the exam, diagnosis,  procedures, and orders are all accurate and complete.   Signed, Brent Furbish, MD  01/13/2022 9:16 AM    [facility]

## 2022-01-13 NOTE — Progress Notes (Unsigned)
Patient ID: Brent [name]                 DOB: November 25, 1955                    MRN: [id number]      HPI: Brent [name] is a 66 y.o. male patient of Dr. Marlou Porch referred to lipid clinic by Robbie Lis, PA. PMH is significant for CAD, hypertension, hyperlipidemia, OSA on CPAP, diabetes and GI bleed. Underwent cardiac catheterization 12/2011 with 70% mid RCA lesion managed medically. Underwent Lexiscan Myoview 07/25/2021 which was low risk with LVEF of 62%.  He called the office 11/23/2021 reporting worsening shortness of breath with exertion and extreme fatigue. Underwent cardiac cath with single-vessel obstructive CAD involving the mid RCA with successful PCI/DES x1. Patient referred to lipid clinic due to progressive disease.   Patient presents today to lipid clinic. He saw Dr. Marlou Porch in clinic earlier today. Doing well on fenofibrate '160mg'$  daily and pitavastatin '2mg'$  daily. Previously did not tolerate other statins. Has been on fenofibrate for yeas. Walks a decent amount with new puppy. Describes pace as moderate. Diet higher in processed meats.   Current Medications: fenofibrate '160mg'$  daily, pitavastatin '2mg'$  daily Intolerances: atorvastatin (myalgias, memory changes), simvastatin (joint pain), pravastatin (joint pain) Risk Factors: HTN, DM LDL goal: <55  Diet: breakfast: english muffin w/ bacon, cheese, egg Lunch: left overs, cheese sandwich Dinner: hamburger, lasagna, BB pork, hot dog w/ salad green beans, pinto beans and corn bread Snacks: natural [place] granola bars Drinks: diet soda, water  Exercise: yard work, walks dog few times a day  Family History: The patient's family history includes ADD / ADHD in his daughter; Colon polyps in his father; Diabetes in his brother, brother, brother, mother, and sister; Heart attack in his brother and brother; Heart attack (age of onset: 61) in his father; Heart disease in his brother, brother, and brother; Hyperlipidemia in his mother; Hypertension in his  father, maternal grandmother, and mother; Obesity in his brother; Stroke in his father, mother, and sister. There is no history of Stomach cancer, Colon cancer, Esophageal cancer, or Rectal cancer.    Social History: quit smoking in 1993, 2-3 beers per day  Labs:  Chol 129 12/09/2021  TRIG 134.0 12/09/2021 1532  HDL 44.00 12/09/2021 1532  CHOLHDL 3 12/09/2021 1532  VLDL 26.8 12/09/2021 1532  LDLCALC 58 12/09/2021 1532  [place] 83 01/11/2020 1543  LDLDIRECT 74.0 05/22/2021 0755    Past Medical History:  Diagnosis Date   Acid reflux disease    ACID REFLUX DISEASE 07/05/2007   Acute gastric ulcer with bleeding    Alcohol abuse    Anemia    Atherosclerosis    Benign neoplasm of colon 07/05/2007   Bilateral hip pain 10/05/2016   Bladder cancer ([place])    Breast pain, left 11/17/2011   CAD (coronary artery disease)    Chest pain    a. Reportedly negative dobut echo performed prior to gastric bypass in 03/2011;  b. 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 12/2011 Cath: LM nl, LAD 50p, D1 69m LCX min irregs, OM3 30, RCA 25p, 734mFFR 0.99->0.89), PDA 30, EF 65%, Med Rx. d. Cath 11/2021 - 90% m RCA s/p PCI/DES x1   COLONIC POLYPS, HX OF 10/29/2008   Diarrhea 06/13/2010   Diverticulosis 07/05/2018   DIVERTICULOSIS, COLON 10/29/2008   DM (diabetes mellitus), type 2, uncontrolled    GI bleed    Gout 03/04/2013   Hearing  loss 05/24/2013   Previous audiology evaluation completely.   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   Neck pain 03/2015   Other and unspecified hyperlipidemia 11/16/2012   Post-operative nausea and vomiting    after the surgery in hospital in March 2020/ past the cauderization   Preventative health care 11/17/2011    Renal stone 11/2013   Sleep apnea    a. CPAP   Spinal stenosis    Tear of meniscus of left knee 2012    Current Outpatient Medications on File Prior to Visit  Medication Sig Dispense Refill   acetaminophen (TYLENOL) 500 MG tablet Take 1,000 mg by mouth every 6 (six) hours as needed for moderate pain or headache.     amitriptyline (ELAVIL) 50 MG tablet TAKE 1 TABLET AT BEDTIME 90 tablet 3   amoxicillin (AMOXIL) 500 MG capsule Take 2,000 mg by mouth See admin instructions.  TAKE 4 CAPSULES BY MOUTH PRIOR TO APPOINTMENT     aspirin EC 81 MG tablet Take 1 tablet (81 mg total) by mouth daily. Swallow whole. 90 tablet 3   Cholecalciferol (VITAMIN D) 50 MCG (2000 UT) tablet Take 6,000 Units by mouth daily.     clopidogrel (PLAVIX) 75 MG tablet Take 1 tablet (75 mg total) by mouth daily with breakfast. 90 tablet 2   Cyanocobalamin (VITAMIN B-12) 1000 MCG SUBL Place 1,000 mcg under the tongue 2 (two) times a week.     Dulaglutide (TRULICITY) 1.5 YB/0.1BP SOPN Inject 1.5 mg into the skin every Friday. Trulicity 1.5 ZW/2.5 mL subcutaneous pen injector     econazole nitrate 1 % cream Apply topically daily. 30 g 1   EPINEPHrine 0.3 mg/0.3 mL IJ SOAJ injection INJECT 0.3 MLS (0.3 MG TOTAL) INTO THE MUSCLE ONCE FOR 1 DOSE 2 each 11   famotidine (PEPCID) 40 MG tablet TAKE 1 TABLET AT BEDTIME AS NEEDED FOR HEARTBURN OR INDIGESTION 90 tablet 3   fenofibrate 160 MG tablet Take 1 tablet (160 mg total) by mouth daily. 90 tablet 1   Ferrous Gluconate-C-Folic Acid (IRON-C PO) Take 1 tablet by mouth daily. Iron 65 mg and vit C-125 mg     fluticasone (FLONASE) 50 MCG/ACT nasal spray USE 2 SPRAYS IN EACH NOSTRIL DAILY 48 g 3   gabapentin (NEURONTIN) 300 MG capsule TAKE 1 CAPSULE TWICE A DAY AND 2 CAPSULES AT BEDTIME 360 capsule 1   glucose blood (FREESTYLE LITE) test strip Use to check blood sugar 4 times day.  Dx Code: E11.9 400 each 1   hydrochlorothiazide (MICROZIDE) 12.5 MG capsule Take 1 capsule (12.5 mg  total) by mouth daily. 90 capsule 1   insulin lispro (HUMALOG) 100 UNIT/ML KwikPen Inject 2-6 Units into the skin 3 (three) times daily. Per sliding scale. 15 mL 0   Insulin Pen Needle (PEN NEEDLES 31GX5/16") 31G X 8 MM MISC Use as directed with Humalog 3 times a day and at bedtime 100 each 0   isosorbide mononitrate (IMDUR) 30 MG 24 hr tablet Take 1 tablet (30 mg total) by mouth daily. (Patient taking differently: Take 30 mg by mouth 2 (two) times daily.) 30 tablet 0   losartan (COZAAR) 50 MG tablet TAKE 1 TABLET DAILY (KEEP UPCOMING APPOINTMENT IN [facility]  2023 WITH CARDIOLOGIST BEFORE ANYMORE REFILLS) 90 tablet 3   Magnesium 100 MG CAPS Take 1 capsule (100 mg total) by mouth daily. 30 capsule 1   metoprolol tartrate (LOPRESSOR) 100 MG tablet Take 1 tablet (100 mg total) by mouth 2 (two) times daily. 180 tablet 3   mometasone (ELOCON) 0.1 % cream Apply 1 Application topically daily. 90 g 1   Multiple Vitamin (MULTIVITAMIN) tablet Take 1 tablet by mouth daily.     nitroGLYCERIN (NITROSTAT) 0.4 MG SL tablet Place 1 tablet (0.4 mg total) under the tongue every 5 (five) minutes as needed. 25 tablet 11   oxycodone (OXY-IR) 5 MG capsule Take 5 mg by mouth 3 (three) times daily as needed for pain.     pantoprazole (PROTONIX) 40 MG tablet TAKE 1 TABLET TWICE A DAY BEFORE MEALS 180 tablet 3   Pitavastatin Calcium (LIVALO) 2 MG TABS Take 1 tablet (2 mg total) by mouth daily. 90 tablet 3   sodium chloride (OCEAN) 0.65 % SOLN nasal spray Place 1 spray into both nostrils as needed for congestion (nose irritation). 30 mL 0   No current facility-administered medications on file prior to visit.    Allergies  Allergen Reactions   Bee Venom Anaphylaxis   Lipitor [Atorvastatin] Other (See Comments)    Myalgias, memory changes   Morphine Other (See Comments)    hyperactive   Metformin And Related Other (See Comments)    Myalgias and weakness   Robaxin [Methocarbamol]     GI bleed    Baclofen     Acted  drunk   Medrol [Methylprednisolone]     Hyperglycemia    Simvastatin     Joint pain   Hydrocodone Itching   Pravastatin Other (See Comments)    Made joints    Assessment/Plan:  1. Hyperlipidemia - LDL-C is just above guideline recommended goal of <55. Will not be able to use PCSK9i due to baseline LDL-C on [place]. We discussed the risk reduction seen with lowering LCL-C and ApoB close to the levels when we are born. Will start ezetimibe '10mg'$  daily. Continue fenofibrate '160mg'$  daily and pitavastatin '2mg'$  daily. Recheck ApoB, lipids and LFT on 12/4.   We discussed the 5 modifiable risk factors for CVD (lipids, BP, Tobacco use, diet and exercise) BP is well controlled, patient no longer uses tobacco. Lipids discussed above. I have encouraged patient to eat more vegetables and limit processed meats. Limit alcohol 1-2 drinks just a few days a week. I have encouraged patient to increase exercise. Daily walking and strength/resistance training at least 2-3 days a week.  Thank you,   Ramond Dial, Pharm.D, BCPS, CPP [place]  [address], [place], [place] [address]  Phone: [phone]; Fax: [phone]

## 2022-01-13 NOTE — Patient Instructions (Addendum)
Start taking ezetimbie '10mg'$  daily. Continue taking fenofibrate '160mg'$  daily, pitavastatin '2mg'$  daily Please come for fasting labs on 12/4 Please call me at [phone] with any questions

## 2022-01-15 ENCOUNTER — Other Ambulatory Visit: Payer: Self-pay | Admitting: Pharmacist

## 2022-01-15 ENCOUNTER — Telehealth: Payer: Self-pay | Admitting: Family Medicine

## 2022-01-15 MED ORDER — EZETIMIBE 10 MG PO TABS: 10.0000 mg | ORAL_TABLET | Freq: Every day | ORAL | 3 refills | Status: DC

## 2022-01-15 NOTE — Telephone Encounter (Signed)
Pt got a reminder call to make sure he is getting his A1C checked regularly. He did state it got checked at his endo office earlier this month, but to let him know if pcp wants to see him anytime soon.

## 2022-01-16 ENCOUNTER — Encounter: Payer: Self-pay | Admitting: Family Medicine

## 2022-01-16 LAB — HEMOGLOBIN A1C: Hemoglobin A1C: 6

## 2022-01-16 NOTE — Telephone Encounter (Signed)
Obtained result via [facility] from 12/10/21 visit at [facility]. A1c result abstracted. Pt may follow up with PCP as scheduled on 06/11/22.

## 2022-01-19 ENCOUNTER — Telehealth: Payer: Self-pay

## 2022-01-19 NOTE — Telephone Encounter (Signed)
     Patient  visit on 9/20  at [facility]   Have you been able to follow up with your primary care physician? yes  The patient was or was not able to obtain any needed medicine or equipment. yes  Are there diet recommendations that you are having difficulty following? na  Patient expresses understanding of discharge instructions and education provided has no other needs at this time. yes     Gerilyn Stargell [facility], [place], Care Management  [phone] [address], [place], [place] [address] Phone: [phone] Email: [email or web address]    

## 2022-01-20 ENCOUNTER — Other Ambulatory Visit: Payer: Self-pay | Admitting: *Deleted

## 2022-01-20 MED ORDER — CLOPIDOGREL BISULFATE 75 MG PO TABS: 75.0000 mg | ORAL_TABLET | Freq: Every day | ORAL | 3 refills | Status: DC

## 2022-01-21 ENCOUNTER — Other Ambulatory Visit: Payer: Self-pay | Admitting: Family Medicine

## 2022-01-22 ENCOUNTER — Telehealth (HOSPITAL_COMMUNITY): Payer: Self-pay

## 2022-01-22 NOTE — Telephone Encounter (Signed)
Called and spoke with pt in regards to CR pt stated he has a lot going on right now and is unable to participate at this time.   Closed referral

## 2022-01-24 ENCOUNTER — Encounter: Payer: Self-pay | Admitting: Cardiology

## 2022-01-26 ENCOUNTER — Encounter: Payer: Self-pay | Admitting: Cardiology

## 2022-01-26 MED ORDER — ISOSORBIDE MONONITRATE ER 30 MG PO TB24: 60.0000 mg | ORAL_TABLET | Freq: Every day | ORAL | 3 refills | Status: DC

## 2022-01-28 DIAGNOSIS — G894 Chronic pain syndrome: Secondary | ICD-10-CM | POA: Diagnosis not present

## 2022-01-28 DIAGNOSIS — M542 Cervicalgia: Secondary | ICD-10-CM | POA: Diagnosis not present

## 2022-01-28 DIAGNOSIS — Z5181 Encounter for therapeutic drug level monitoring: Secondary | ICD-10-CM | POA: Diagnosis not present

## 2022-01-28 DIAGNOSIS — M5412 Radiculopathy, cervical region: Secondary | ICD-10-CM | POA: Diagnosis not present

## 2022-01-28 DIAGNOSIS — Z9689 Presence of other specified functional implants: Secondary | ICD-10-CM | POA: Diagnosis not present

## 2022-01-28 DIAGNOSIS — Z79899 Other long term (current) drug therapy: Secondary | ICD-10-CM | POA: Diagnosis not present

## 2022-01-28 DIAGNOSIS — M5414 Radiculopathy, thoracic region: Secondary | ICD-10-CM | POA: Diagnosis not present

## 2022-02-02 ENCOUNTER — Ambulatory Visit: Payer: Medicare Other | Admitting: Cardiology

## 2022-02-03 ENCOUNTER — Telehealth: Payer: Self-pay | Admitting: Internal Medicine

## 2022-02-03 NOTE — Telephone Encounter (Signed)
Inbound call from patient stating he has been passing blood in his stool since 8 am . Patient also sates his stool is black. Patient is inquiring if he should go to the hospital.Please advise.  Thank you

## 2022-02-03 NOTE — Telephone Encounter (Signed)
Spoke with pt and he reports he has passed 2 stools today that were black/purple looking. Pt wanted to know if he needed to come to our office or go to the ER. Discussed with pt that if this continued he needed to go to he ER to be evaluated. Pt verbalized understanding.

## 2022-02-04 ENCOUNTER — Encounter (HOSPITAL_BASED_OUTPATIENT_CLINIC_OR_DEPARTMENT_OTHER): Payer: Self-pay | Admitting: Emergency Medicine

## 2022-02-04 ENCOUNTER — Inpatient Hospital Stay (HOSPITAL_BASED_OUTPATIENT_CLINIC_OR_DEPARTMENT_OTHER)
Admission: EM | Admit: 2022-02-04 | Discharge: 2022-02-07 | DRG: 378 | Disposition: A | Discharge status: Home / Self Care | Payer: Medicare Other | Attending: Internal Medicine | Admitting: Internal Medicine

## 2022-02-04 ENCOUNTER — Other Ambulatory Visit: Payer: Self-pay

## 2022-02-04 ENCOUNTER — Encounter (HOSPITAL_COMMUNITY): Payer: Self-pay

## 2022-02-04 DIAGNOSIS — E785 Hyperlipidemia, unspecified: Secondary | ICD-10-CM | POA: Diagnosis present

## 2022-02-04 DIAGNOSIS — D6832 Hemorrhagic disorder due to extrinsic circulating anticoagulants: Secondary | ICD-10-CM | POA: Diagnosis not present

## 2022-02-04 DIAGNOSIS — K219 Gastro-esophageal reflux disease without esophagitis: Secondary | ICD-10-CM | POA: Diagnosis present

## 2022-02-04 DIAGNOSIS — E669 Obesity, unspecified: Secondary | ICD-10-CM | POA: Diagnosis present

## 2022-02-04 DIAGNOSIS — Z7982 Long term (current) use of aspirin: Secondary | ICD-10-CM | POA: Diagnosis not present

## 2022-02-04 DIAGNOSIS — Z7902 Long term (current) use of antithrombotics/antiplatelets: Secondary | ICD-10-CM | POA: Diagnosis not present

## 2022-02-04 DIAGNOSIS — N182 Chronic kidney disease, stage 2 (mild): Secondary | ICD-10-CM | POA: Diagnosis present

## 2022-02-04 DIAGNOSIS — Z955 Presence of coronary angioplasty implant and graft: Secondary | ICD-10-CM | POA: Diagnosis not present

## 2022-02-04 DIAGNOSIS — Z91040 Latex allergy status: Secondary | ICD-10-CM

## 2022-02-04 DIAGNOSIS — Z6837 Body mass index (BMI) 37.0-37.9, adult: Secondary | ICD-10-CM | POA: Diagnosis not present

## 2022-02-04 DIAGNOSIS — I25118 Atherosclerotic heart disease of native coronary artery with other forms of angina pectoris: Secondary | ICD-10-CM | POA: Diagnosis present

## 2022-02-04 DIAGNOSIS — Z87891 Personal history of nicotine dependence: Secondary | ICD-10-CM | POA: Diagnosis not present

## 2022-02-04 DIAGNOSIS — E1140 Type 2 diabetes mellitus with diabetic neuropathy, unspecified: Secondary | ICD-10-CM | POA: Diagnosis present

## 2022-02-04 DIAGNOSIS — Z79899 Other long term (current) drug therapy: Secondary | ICD-10-CM | POA: Diagnosis not present

## 2022-02-04 DIAGNOSIS — N179 Acute kidney failure, unspecified: Secondary | ICD-10-CM | POA: Diagnosis not present

## 2022-02-04 DIAGNOSIS — D62 Acute posthemorrhagic anemia: Secondary | ICD-10-CM | POA: Diagnosis not present

## 2022-02-04 DIAGNOSIS — T45515A Adverse effect of anticoagulants, initial encounter: Secondary | ICD-10-CM | POA: Diagnosis not present

## 2022-02-04 DIAGNOSIS — Z823 Family history of stroke: Secondary | ICD-10-CM | POA: Diagnosis not present

## 2022-02-04 DIAGNOSIS — K254 Chronic or unspecified gastric ulcer with hemorrhage: Secondary | ICD-10-CM | POA: Diagnosis not present

## 2022-02-04 DIAGNOSIS — K289 Gastrojejunal ulcer, unspecified as acute or chronic, without hemorrhage or perforation: Secondary | ICD-10-CM | POA: Diagnosis not present

## 2022-02-04 DIAGNOSIS — Z86010 Personal history of colonic polyps: Secondary | ICD-10-CM

## 2022-02-04 DIAGNOSIS — E1100 Type 2 diabetes mellitus with hyperosmolarity without nonketotic hyperglycemic-hyperosmolar coma (NKHHC): Secondary | ICD-10-CM

## 2022-02-04 DIAGNOSIS — D509 Iron deficiency anemia, unspecified: Secondary | ICD-10-CM | POA: Diagnosis not present

## 2022-02-04 DIAGNOSIS — K9189 Other postprocedural complications and disorders of digestive system: Secondary | ICD-10-CM | POA: Diagnosis not present

## 2022-02-04 DIAGNOSIS — I129 Hypertensive chronic kidney disease with stage 1 through stage 4 chronic kidney disease, or unspecified chronic kidney disease: Secondary | ICD-10-CM | POA: Diagnosis not present

## 2022-02-04 DIAGNOSIS — Z888 Allergy status to other drugs, medicaments and biological substances status: Secondary | ICD-10-CM

## 2022-02-04 DIAGNOSIS — I2510 Atherosclerotic heart disease of native coronary artery without angina pectoris: Secondary | ICD-10-CM | POA: Diagnosis not present

## 2022-02-04 DIAGNOSIS — Z91030 Bee allergy status: Secondary | ICD-10-CM

## 2022-02-04 DIAGNOSIS — K921 Melena: Principal | ICD-10-CM

## 2022-02-04 DIAGNOSIS — Z885 Allergy status to narcotic agent status: Secondary | ICD-10-CM

## 2022-02-04 DIAGNOSIS — Z8551 Personal history of malignant neoplasm of bladder: Secondary | ICD-10-CM

## 2022-02-04 DIAGNOSIS — Z9884 Bariatric surgery status: Secondary | ICD-10-CM | POA: Diagnosis not present

## 2022-02-04 DIAGNOSIS — K633 Ulcer of intestine: Secondary | ICD-10-CM | POA: Diagnosis not present

## 2022-02-04 DIAGNOSIS — I10 Essential (primary) hypertension: Secondary | ICD-10-CM | POA: Diagnosis not present

## 2022-02-04 DIAGNOSIS — Z794 Long term (current) use of insulin: Secondary | ICD-10-CM

## 2022-02-04 DIAGNOSIS — Z96651 Presence of right artificial knee joint: Secondary | ICD-10-CM | POA: Diagnosis present

## 2022-02-04 DIAGNOSIS — K922 Gastrointestinal hemorrhage, unspecified: Secondary | ICD-10-CM | POA: Diagnosis not present

## 2022-02-04 DIAGNOSIS — Z83438 Family history of other disorder of lipoprotein metabolism and other lipidemia: Secondary | ICD-10-CM

## 2022-02-04 DIAGNOSIS — R0989 Other specified symptoms and signs involving the circulatory and respiratory systems: Secondary | ICD-10-CM

## 2022-02-04 DIAGNOSIS — D649 Anemia, unspecified: Secondary | ICD-10-CM | POA: Diagnosis not present

## 2022-02-04 DIAGNOSIS — E1122 Type 2 diabetes mellitus with diabetic chronic kidney disease: Secondary | ICD-10-CM | POA: Diagnosis present

## 2022-02-04 DIAGNOSIS — H9190 Unspecified hearing loss, unspecified ear: Secondary | ICD-10-CM | POA: Diagnosis present

## 2022-02-04 DIAGNOSIS — G4733 Obstructive sleep apnea (adult) (pediatric): Secondary | ICD-10-CM | POA: Diagnosis present

## 2022-02-04 DIAGNOSIS — Z833 Family history of diabetes mellitus: Secondary | ICD-10-CM

## 2022-02-04 DIAGNOSIS — Z83719 Family history of colon polyps, unspecified: Secondary | ICD-10-CM

## 2022-02-04 DIAGNOSIS — R0609 Other forms of dyspnea: Secondary | ICD-10-CM

## 2022-02-04 DIAGNOSIS — Z8249 Family history of ischemic heart disease and other diseases of the circulatory system: Secondary | ICD-10-CM

## 2022-02-04 LAB — CBC
HCT: 17.3 % — ABNORMAL LOW (ref 39.0–52.0)
Hemoglobin: 5.7 g/dL — CL (ref 13.0–17.0)
MCH: 30.2 pg (ref 26.0–34.0)
MCHC: 32.9 g/dL (ref 30.0–36.0)
MCV: 91.5 fL (ref 80.0–100.0)
Platelets: 195 10*3/uL (ref 150–400)
RBC: 1.89 MIL/uL — ABNORMAL LOW (ref 4.22–5.81)
RDW: 13.4 % (ref 11.5–15.5)
WBC: 6.9 10*3/uL (ref 4.0–10.5)
nRBC: 0 % (ref 0.0–0.2)

## 2022-02-04 LAB — APTT: aPTT: 26 seconds (ref 24–36)

## 2022-02-04 LAB — COMPREHENSIVE METABOLIC PANEL
ALT: 11 U/L (ref 0–44)
AST: 14 U/L — ABNORMAL LOW (ref 15–41)
Albumin: 3.9 g/dL (ref 3.5–5.0)
Alkaline Phosphatase: 21 U/L — ABNORMAL LOW (ref 38–126)
Anion gap: 8 (ref 5–15)
BUN: 41 mg/dL — ABNORMAL HIGH (ref 8–23)
CO2: 24 mmol/L (ref 22–32)
Calcium: 8.6 mg/dL — ABNORMAL LOW (ref 8.9–10.3)
Chloride: 106 mmol/L (ref 98–111)
Creatinine, Ser: 1.61 mg/dL — ABNORMAL HIGH (ref 0.61–1.24)
GFR, Estimated: 47 mL/min — ABNORMAL LOW (ref >60)
Glucose, Bld: 137 mg/dL — ABNORMAL HIGH (ref 70–99)
Potassium: 4.4 mmol/L (ref 3.5–5.1)
Sodium: 138 mmol/L (ref 135–145)
Total Bilirubin: 0.3 mg/dL (ref 0.3–1.2)
Total Protein: 5.4 g/dL — ABNORMAL LOW (ref 6.5–8.1)

## 2022-02-04 LAB — PROTIME-INR
INR: 1.1 (ref 0.8–1.2)
Prothrombin Time: 14.5 seconds (ref 11.4–15.2)

## 2022-02-04 LAB — MRSA NEXT GEN BY PCR, NASAL: MRSA by PCR Next Gen: NOT DETECTED

## 2022-02-04 LAB — TROPONIN I (HIGH SENSITIVITY)
Troponin I (High Sensitivity): 2 ng/L (ref <18)
Troponin I (High Sensitivity): 3 ng/L (ref <18)

## 2022-02-04 LAB — GLUCOSE, CAPILLARY: Glucose-Capillary: 116 mg/dL — ABNORMAL HIGH (ref 70–99)

## 2022-02-04 LAB — ABO/RH: ABO/RH(D): A POS

## 2022-02-04 LAB — PREPARE RBC (CROSSMATCH)

## 2022-02-04 LAB — OCCULT BLOOD X 1 CARD TO LAB, STOOL: Fecal Occult Bld: POSITIVE — AB

## 2022-02-04 MED ORDER — PANTOPRAZOLE SODIUM 40 MG IV SOLR
INTRAVENOUS | Status: AC
  Filled 2022-02-04: qty 20

## 2022-02-04 MED ORDER — LACTATED RINGERS IV BOLUS
1000.0000 mL | Freq: Once | INTRAVENOUS | Status: AC
  Administered 2022-02-04: 1000 mL via INTRAVENOUS

## 2022-02-04 MED ORDER — SODIUM CHLORIDE 0.9 % IV SOLN
10.0000 mL/h | Freq: Once | INTRAVENOUS | Status: AC
  Administered 2022-02-04: 10 mL/h via INTRAVENOUS

## 2022-02-04 MED ORDER — CHLORHEXIDINE GLUCONATE CLOTH 2 % EX PADS
6.0000 | MEDICATED_PAD | Freq: Every day | CUTANEOUS | Status: DC
  Administered 2022-02-04 – 2022-02-07 (×4): 6 via TOPICAL

## 2022-02-04 MED ORDER — DOCUSATE SODIUM 100 MG PO CAPS
100.0000 mg | ORAL_CAPSULE | Freq: Two times a day (BID) | ORAL | Status: DC | PRN
  Filled 2022-02-04: qty 1

## 2022-02-04 MED ORDER — ORAL CARE MOUTH RINSE: 15.0000 mL | OROMUCOSAL | Status: DC | PRN

## 2022-02-04 MED ORDER — POLYETHYLENE GLYCOL 3350 17 G PO PACK
17.0000 g | PACK | Freq: Every day | ORAL | Status: DC | PRN
  Filled 2022-02-04: qty 1

## 2022-02-04 MED ORDER — PANTOPRAZOLE INFUSION (NEW) - SIMPLE MED
8.0000 mg/h | INTRAVENOUS | Status: DC
  Administered 2022-02-04 – 2022-02-05 (×3): 8 mg/h via INTRAVENOUS
  Filled 2022-02-04: qty 80
  Filled 2022-02-04 (×3): qty 100

## 2022-02-04 MED ORDER — PANTOPRAZOLE SODIUM 40 MG IV SOLR: 40.0000 mg | Freq: Two times a day (BID) | INTRAVENOUS | Status: DC

## 2022-02-04 MED ORDER — ONDANSETRON HCL 4 MG/2ML IJ SOLN: 4.0000 mg | Freq: Four times a day (QID) | INTRAMUSCULAR | Status: DC | PRN

## 2022-02-04 MED ORDER — LACTATED RINGERS IV SOLN: INTRAVENOUS | Status: AC

## 2022-02-04 MED ORDER — PANTOPRAZOLE 80MG IVPB - SIMPLE MED
80.0000 mg | Freq: Once | INTRAVENOUS | Status: AC
  Administered 2022-02-04: 80 mg via INTRAVENOUS
  Filled 2022-02-04: qty 100

## 2022-02-04 NOTE — H&P (Signed)
NAME:  Brent [name], MRN:  [id number], DOB:  02-03-1956, LOS: 0 ADMISSION DATE:  02/04/2022, CONSULTATION DATE:  02/04/22 REFERRING MD:  Georgina Snell, MD CHIEF COMPLAINT:  Acute blood loss anemia   History of Present Illness:  66 year old male with CAD s/p recent stent to RCA 11/2021 on DAPT, hx GIB who presents with two day history of hematochezia. Reports normal health prior to this except for recent cardiac stent placement two months ago. Began having dizziness and weakness on day of admission associated with dark tarry stools that increased in volume with each BM. Denies syncope, shortness of breath, chest pain. Reports this is similar to his ulcer bleed in March 2020.  Pertinent  Medical History  DM2, HTN, OSA, HLD, hx Roux-en-Y gastric bypass, IDA  Significant Hospital Events: Including procedures, antibiotic start and stop dates in addition to other pertinent events   10/18 - Admitted for GI bleed. Transfused PRBC x 2. HDS  Interim History / Subjective:  As above  Objective   Blood pressure (!) 113/54, pulse 93, temperature 98.6 F (37 C), temperature source Oral, resp. rate (!) 21, SpO2 99 %.        Intake/Output Summary (Last 24 hours) at 02/04/2022 2116 Last data filed at 02/04/2022 2004 Gross per 24 hour  Intake 2095.42 ml  Output --  Net 2095.42 ml   There were no vitals filed for this visit.  Physical Exam: General: Well-appearing, no acute distress HENT: [place], AT Eyes: EOMI, no scleral icterus Respiratory: Clear to auscultation bilaterally.  No crackles, wheezing or rales Cardiovascular: RRR, -M/R/G, no JVD Extremities:-Edema,-tenderness Neuro: AAO x4, CNII-XII grossly intact Psych: Normal mood, normal affect   Resolved Hospital Problem list   N/A  Assessment & Plan:  Acute blood loss anemia Admission Hg 5.7. Baseline 11-13 S/p PRBC x 1. Hx Roux-en Y gastric bypass and marginal ulcer that resolved on 5/15 EGD. Colonoscopy with two 2-89m  polyps --2nd unit pending --PPI BID --Trend CBC --LR 75ccx10h --Consult GI an am  CAD s/p recent stent to RCA 11/2021  HTN --Holding DAPT --Holding antihypertensive agents --Consider Cardiology input prior to discharge  AKI, CKD II --IVF overnight --Trend UOP/Cr --Avoid nephrotoxic agents  GERD Hx ulcer --PPI  Best Practice (right click and "Reselect all SmartList Selections" daily)   Diet/type: NPO DVT prophylaxis: other contraindicated GI prophylaxis: PPI Lines: N/A Foley:  N/A Code Status:  full code Last date of multidisciplinary goals of care discussion '[]'$   Labs   CBC: Recent Labs  Lab 02/04/22 1846  WBC 6.9  HGB 5.7*  HCT 17.3*  MCV 91.5  PLT 1782   Basic Metabolic Panel: Recent Labs  Lab 02/04/22 1846  NA 138  K 4.4  CL 106  CO2 24  GLUCOSE 137*  BUN 41*  CREATININE 1.61*  CALCIUM 8.6*   GFR: CrCl cannot be calculated (Unknown ideal weight.). Recent Labs  Lab 02/04/22 1846  WBC 6.9    Liver Function Tests: Recent Labs  Lab 02/04/22 1846  AST 14*  ALT 11  ALKPHOS 21*  BILITOT 0.3  PROT 5.4*  ALBUMIN 3.9   No results for input(s): "LIPASE", "AMYLASE" in the last 168 hours. No results for input(s): "AMMONIA" in the last 168 hours.  ABG    Component Value Date/Time   TCO2 21 03/28/2013 1857     Coagulation Profile: Recent Labs  Lab 02/04/22 1815  INR 1.1    Cardiac Enzymes: No results for input(s): "CKTOTAL", "CKMB", "CKMBINDEX", "  TROPONINI" in the last 168 hours.  HbA1C: Hemoglobin A1C  Date/Time Value Ref Range Status  12/10/2021 12:00 AM 6 %  Final   Hgb A1c MFr Bld  Date/Time Value Ref Range Status  09/24/2020 11:15 AM 8.0 (H) 4.8 - 5.6 % Final    Comment:    (NOTE)         Prediabetes: 5.7 - 6.4         Diabetes: >6.4         Glycemic control for adults with diabetes: <7.0   01/11/2020 03:43 PM 7.7 (H) <5.7 % of total Hgb Final    Comment:    For someone without known diabetes, a hemoglobin  A1c value of 6.5% or greater indicates that they may have  diabetes and this should be confirmed with a follow-up  test. . For someone with known diabetes, a value <7% indicates  that their diabetes is well controlled and a value  greater than or equal to 7% indicates suboptimal  control. A1c targets should be individualized based on  duration of diabetes, age, comorbid conditions, and  other considerations. . Currently, no consensus exists regarding use of hemoglobin A1c for diagnosis of diabetes for children. .     CBG: Recent Labs  Lab 02/04/22 2059  GLUCAP 116*    Review of Systems:   Review of Systems  Constitutional:  Positive for malaise/fatigue. Negative for chills, diaphoresis, fever and weight loss.  HENT:  Negative for congestion.   Respiratory:  Negative for cough, hemoptysis, sputum production, shortness of breath and wheezing.   Cardiovascular:  Negative for chest pain, palpitations and leg swelling.  Gastrointestinal:  Positive for melena.     Past Medical History:  He,  has a past medical history of Acid reflux disease, ACID REFLUX DISEASE (07/05/2007), Acute gastric ulcer with bleeding, Alcohol abuse, Anemia, Atherosclerosis, Benign neoplasm of colon (07/05/2007), Bilateral hip pain (10/05/2016), Bladder cancer ([place]), Breast pain, left (11/17/2011), CAD (coronary artery disease), Chest pain, COLONIC POLYPS, HX OF (10/29/2008), Diarrhea (06/13/2010), Diverticulosis (07/05/2018), DIVERTICULOSIS, COLON (10/29/2008), DM (diabetes mellitus), type 2, uncontrolled, GI bleed, Gout (03/04/2013), Hearing loss (05/24/2013), HTN (hypertension) (07/14/2010), colonic polyps, HYPERSOMNIA, ASSOCIATED WITH SLEEP APNEA (07/26/2008), Hypertension (07/14/2010), 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   Surgical History:   Past Surgical History:  Procedure Laterality Date   ANKLE SURGERY Right 1994   BICEPS TENDON REPAIR     left side   CARDIAC CATHETERIZATION     denies any chest pain in the past 2 years   COLONOSCOPY     colonoscopy polyps     CORONARY STENT INTERVENTION N/A 11/28/2021   Procedure: CORONARY STENT INTERVENTION;  Surgeon: Martinique, Peter M, MD;  Location: [facility];  Service: Cardiovascular;  Laterality: N/A;   ESOPHAGOGASTRODUODENOSCOPY N/A 03/27/2013   Procedure: ESOPHAGOGASTRODUODENOSCOPY (EGD);  Surgeon: Irene Shipper, MD;  Location: [facility];  Service: Endoscopy;  Laterality: N/A;   ESOPHAGOGASTRODUODENOSCOPY (EGD) WITH PROPOFOL N/A 07/06/2018   Procedure: ESOPHAGOGASTRODUODENOSCOPY (EGD) WITH PROPOFOL;  Surgeon: Jerene Bears, MD;  Location: [facility];  Service: Gastroenterology;  Laterality: N/A;   GASTRIC BYPASS     HERNIA REPAIR     HOT HEMOSTASIS N/A 07/06/2018   Procedure: HOT HEMOSTASIS (ARGON PLASMA COAGULATION/BICAP);  Surgeon:  Pyrtle, Lajuan Lines, MD;  Location: [facility];  Service: Gastroenterology;  Laterality: N/A;   KNEE ARTHROSCOPY Left 11/06/2010   Left, torn meniscus (repaired)   LEFT HEART CATH AND CORONARY ANGIOGRAPHY N/A 11/28/2021   Procedure: LEFT HEART CATH AND CORONARY ANGIOGRAPHY;  Surgeon: Martinique, Peter M, MD;  Location: [facility];  Service: Cardiovascular;  Laterality: N/A;   LEFT HEART CATHETERIZATION WITH CORONARY ANGIOGRAM N/A 12/23/2011   Procedure: LEFT HEART CATHETERIZATION WITH CORONARY ANGIOGRAM;  Surgeon: Minus Breeding, MD;  Location: [facility];  Service: Cardiovascular;  Laterality: N/A;   REPLACEMENT TOTAL KNEE Right 01/2016   removed scar tissue/ cut tip of nerve bundle and re-route   right knee arthroscopy Right 07/05/14   Dr. Hart Robinsons, [place].   ROTATOR  CUFF REPAIR  2019   left   SCHLEROTHERAPY  07/06/2018   Procedure: SCHLEROTHERAPY;  Surgeon: Jerene Bears, MD;  Location: [facility];  Service: Gastroenterology;;   TONSILLECTOMY  age 13   TONSILLECTOMY     as a child   TOTAL KNEE ARTHROPLASTY Right 01/20/2016   Procedure: RIGHT TOTAL KNEE ARTHROPLASTY;  Surgeon: Paralee Cancel, MD;  Location: [facility];  Service: Orthopedics;  Laterality: Right;   TRANSURETHRAL RESECTION OF BLADDER TUMOR N/A 09/24/2020   Procedure: TRANSURETHRAL RESECTION OF BLADDER TUMOR (TURBT)  RIGHT retrograde pylegram;  Surgeon: [name], MD;  Location: [facility];  Service: Urology;  Laterality: N/A;   UPPER GI ENDOSCOPY  03/27/13     Social History:   reports that he quit smoking about 30 years ago. His smoking use included cigarettes. He has a 30.00 pack-year smoking history. He has never used smokeless tobacco. He reports current alcohol use of about 3.0 standard drinks of alcohol per week. He reports that he does not use drugs.   Family History:  His family history includes ADD / ADHD in his daughter; Colon polyps in his father; Diabetes in his brother, brother, brother, mother, and sister; Heart attack in his brother and brother; Heart attack (age of onset: 27) in his father; Heart disease in his brother, brother, and brother; Hyperlipidemia in his mother; Hypertension in his father, maternal grandmother, and mother; Obesity in his brother; Stroke in his father, mother, and sister. There is no history of Stomach cancer, Colon cancer, Esophageal cancer, or Rectal cancer.   Allergies Allergies  Allergen Reactions   Bee Venom Anaphylaxis   Lipitor [Atorvastatin] Other (See Comments)    Myalgias, memory changes   Morphine Other (See Comments)    hyperactive   Metformin And Related Other (See Comments)    Myalgias and weakness   Robaxin [Methocarbamol]     GI bleed    Baclofen     Acted drunk   Medrol [Methylprednisolone]     Hyperglycemia    Simvastatin      Joint pain   Hydrocodone Itching   Pravastatin Other (See Comments)    Made joints     Home Medications  Prior to Admission medications   Medication Sig Start Date End Date Taking? Authorizing Provider  acetaminophen (TYLENOL) 500 MG tablet Take 1,000 mg by mouth every 6 (six) hours as needed for moderate pain or headache.    [provider]  amitriptyline (ELAVIL) 50 MG tablet TAKE 1 TABLET AT BEDTIME 07/03/21   [name], MD  amoxicillin (AMOXIL) 500 MG capsule Take 2,000 mg by mouth See admin instructions.  TAKE 4 CAPSULES BY MOUTH PRIOR TO APPOINTMENT    [provider]  aspirin EC 81 MG tablet Take 1 tablet (81 mg total) by mouth daily. Swallow whole. 11/24/21   Finis Bud, NP  Cholecalciferol (VITAMIN D) 50 MCG (2000 UT) tablet Take 6,000 Units by mouth daily.    [provider]  clopidogrel (PLAVIX) 75 MG tablet Take 1 tablet (75 mg total) by mouth daily with breakfast. 01/20/22   Jerline Pain, MD  Cyanocobalamin (VITAMIN B-12) 1000 MCG SUBL Place 1,000 mcg under the tongue 2 (two) times a week.    [provider]  Dulaglutide (TRULICITY) 1.5 VX/7.9TJ SOPN Inject 1.5 mg into the skin every Friday. Trulicity 1.5 QZ/0.0 mL subcutaneous pen injector    [provider]  econazole nitrate 1 % cream Apply topically daily. 10/06/21   Debbrah Alar, NP  EPINEPHrine 0.3 mg/0.3 mL IJ SOAJ injection INJECT 0.3 MLS (0.3 MG TOTAL) INTO THE MUSCLE ONCE FOR 1 DOSE 10/06/21   Debbrah Alar, NP  ezetimibe (ZETIA) 10 MG tablet Take 1 tablet (10 mg total) by mouth daily. 01/15/22   Jerline Pain, MD  famotidine (PEPCID) 40 MG tablet TAKE 1 TABLET AT BEDTIME AS NEEDED FOR HEARTBURN OR INDIGESTION 10/09/21   [name], MD  fenofibrate 160 MG tablet Take 1 tablet (160 mg total) by mouth daily. 12/23/21   [name], MD  Ferrous Gluconate-C-Folic Acid (IRON-C PO) Take 1 tablet by mouth daily. Iron 65 mg and vit C-125 mg    [provider]  fluticasone (FLONASE) 50 MCG/ACT nasal spray USE 2 SPRAYS IN EACH NOSTRIL DAILY 12/10/21   [name], MD  gabapentin (NEURONTIN) 300 MG capsule TAKE 1 CAPSULE TWICE A DAY AND 2 CAPSULES AT BEDTIME 10/08/21   [name], MD  glucose blood (FREESTYLE LITE) test strip Use to check blood sugar 4 times day.  Dx Code: E11.9 09/10/20   [name], MD  hydrochlorothiazide (MICROZIDE) 12.5 MG capsule Take 1 capsule (12.5 mg total) by mouth daily. 11/24/21   Finis Bud, NP  insulin lispro (HUMALOG) 100 UNIT/ML KwikPen Inject 2-6 Units into the skin 3 (three) times daily. Per sliding scale. 10/31/20   [name], MD  Insulin Pen Needle (PEN NEEDLES 31GX5/16") 31G X 8 MM MISC Use as directed with Humalog 3 times a day and at bedtime 09/09/20   [name], MD  isosorbide mononitrate (IMDUR) 30 MG 24 hr tablet Take 2 tablets (60 mg total) by mouth daily. 01/26/22   Jerline Pain, MD  losartan (COZAAR) 50 MG tablet TAKE 1 TABLET DAILY (KEEP UPCOMING APPOINTMENT IN MARCH 2023 WITH CARDIOLOGIST BEFORE ANYMORE REFILLS) 09/08/21   Jerline Pain, MD  Magnesium 100 MG CAPS Take 1 capsule (100 mg total) by mouth daily. 06/29/17   [name], MD  metoprolol tartrate (LOPRESSOR) 100 MG tablet Take 1 tablet (100 mg total) by mouth 2 (two) times daily. 08/07/21   Jerline Pain, MD  mometasone (ELOCON) 0.1 % cream Apply 1 Application topically daily. 10/06/21   Debbrah Alar, NP  Multiple Vitamin (MULTIVITAMIN) tablet Take 1 tablet by mouth daily.    [provider]  nitroGLYCERIN (NITROSTAT) 0.4 MG SL tablet Place 1 tablet (0.4 mg total) under the tongue every 5 (five) minutes as needed. 12/29/21   Jerline Pain, MD  oxycodone (OXY-IR) 5 MG capsule Take 5 mg by mouth 3 (three) times daily as needed for pain.    [provider]  pantoprazole (PROTONIX) 40 MG tablet TAKE  1 TABLET TWICE A DAY BEFORE MEALS 01/21/22   [name], MD  Pitavastatin Calcium  (LIVALO) 2 MG TABS Take 1 tablet (2 mg total) by mouth daily. 11/24/21   [name], MD  sodium chloride (OCEAN) 0.65 % SOLN nasal spray Place 1 spray into both nostrils as needed for congestion (nose irritation). 07/08/18   Swayze, Ava, DO     Critical care time: 40 min    The patient is critically ill with multiple organ systems failure and requires high complexity decision making for assessment and support, frequent evaluation and titration of therapies, application of advanced monitoring technologies and extensive interpretation of multiple databases.  Independent Critical Care Time: 40 Minutes.   Rodman Pickle, M.D. [facility] Pulmonary/Critical Care Medicine 02/04/2022 9:16 PM   Please see Amion for pager number to reach on-call Pulmonary and Critical Care Team.

## 2022-02-04 NOTE — ED Notes (Addendum)
Blood started at [facility] Unit # [id number] PRODUCT # [id number] Pt stable at this time

## 2022-02-04 NOTE — ED Notes (Signed)
Called to give report to receiving nurse and was asked to call back in 15 minutes. Advised that I would call back to give report.

## 2022-02-04 NOTE — ED Provider Notes (Signed)
[facility] EMERGENCY DEPT Provider Note   CSN: [id number] Arrival date & time: 02/04/22  1707     History  Chief Complaint  Patient presents with   GI Bleeding    Brent [name] is a 66 y.o. male.  With PMH of GI bleed secondary to gastric ulcer and Jejunal Marginal Ulcer, CAD on aspirin and Plavix, GERD, DM 2, obesity who presents with 2 days of melena and lightheadedness.  Patient says he started having dark black tarry stools yesterday and has had approximately 3 since yesterday that been large-volume.  He has had no hematemesis, no fevers, no syncopal episodes.  However he does note having some upper epigastrium burning and then today when he was walking up the stairs he had some nonradiating left-sided chest pain that relieved with nitroglycerin and rest.  He says it did not feel similar to previous heart attacks.  He is on aspirin and Plavix for history of coronary disease.  When he got up today to go into the ER, he felt very lightheaded like he was going to faint but did not faint.  He is on a beta-blocker.  His last episode was back in 2020 when it was found to be related to an ulcer.  He has no history of liver disease or cirrhosis.  HPI     Home Medications Prior to Admission medications   Medication Sig Start Date End Date Taking? Authorizing Provider  acetaminophen (TYLENOL) 500 MG tablet Take 1,000 mg by mouth every 6 (six) hours as needed for moderate pain or headache.    [provider]  amitriptyline (ELAVIL) 50 MG tablet TAKE 1 TABLET AT BEDTIME 07/03/21   [name], MD  amoxicillin (AMOXIL) 500 MG capsule Take 2,000 mg by mouth See admin instructions.  TAKE 4 CAPSULES BY MOUTH PRIOR TO APPOINTMENT    [provider]  aspirin EC 81 MG tablet Take 1 tablet (81 mg total) by mouth daily. Swallow whole. 11/24/21   Finis Bud, NP  Cholecalciferol (VITAMIN D) 50 MCG (2000 UT) tablet Take 6,000 Units by mouth daily.    [provider]  clopidogrel (PLAVIX) 75 MG tablet Take 1 tablet (75 mg total) by mouth daily with breakfast. 01/20/22   Jerline Pain, MD  Cyanocobalamin (VITAMIN B-12) 1000 MCG SUBL Place 1,000 mcg under the tongue 2 (two) times a week.    [provider]  Dulaglutide (TRULICITY) 1.5 DQ/2.2WL SOPN Inject 1.5 mg into the skin every Friday. Trulicity 1.5 NL/8.9 mL subcutaneous pen injector    [provider]  econazole nitrate 1 % cream Apply topically daily. 10/06/21   Debbrah Alar, NP  EPINEPHrine 0.3 mg/0.3 mL IJ SOAJ injection INJECT 0.3 MLS (0.3 MG TOTAL) INTO THE MUSCLE ONCE FOR 1 DOSE 10/06/21   Debbrah Alar, NP  ezetimibe (ZETIA) 10 MG tablet Take 1 tablet (10 mg total) by mouth daily. 01/15/22   Jerline Pain, MD  famotidine (PEPCID) 40 MG tablet TAKE 1 TABLET AT BEDTIME AS NEEDED FOR HEARTBURN OR INDIGESTION 10/09/21   [name], MD  fenofibrate 160 MG tablet Take 1 tablet (160 mg total) by mouth daily. 12/23/21   [name], MD  Ferrous Gluconate-C-Folic Acid (IRON-C PO) Take 1 tablet by mouth daily. Iron 65 mg and vit C-125 mg    [provider]  fluticasone (FLONASE) 50 MCG/ACT nasal spray USE 2 SPRAYS IN EACH NOSTRIL DAILY 12/10/21   [name], MD  gabapentin (NEURONTIN)  300 MG capsule TAKE 1 CAPSULE TWICE A DAY AND 2 CAPSULES AT BEDTIME 10/08/21   [name], MD  glucose blood (FREESTYLE LITE) test strip Use to check blood sugar 4 times day.  Dx Code: E11.9 09/10/20   [name], MD  hydrochlorothiazide (MICROZIDE) 12.5 MG capsule Take 1 capsule (12.5 mg total) by mouth daily. 11/24/21   Finis Bud, NP  insulin lispro (HUMALOG) 100 UNIT/ML KwikPen Inject 2-6 Units into the skin 3 (three) times daily. Per sliding scale. 10/31/20   [name], MD  Insulin Pen Needle (PEN NEEDLES 31GX5/16") 31G X 8 MM MISC Use as directed with Humalog 3 times a day and at bedtime 09/09/20   [name], MD  isosorbide mononitrate (IMDUR)  30 MG 24 hr tablet Take 2 tablets (60 mg total) by mouth daily. 01/26/22   Jerline Pain, MD  losartan (COZAAR) 50 MG tablet TAKE 1 TABLET DAILY (KEEP UPCOMING APPOINTMENT IN MARCH 2023 WITH CARDIOLOGIST BEFORE ANYMORE REFILLS) 09/08/21   Jerline Pain, MD  Magnesium 100 MG CAPS Take 1 capsule (100 mg total) by mouth daily. 06/29/17   [name], MD  metoprolol tartrate (LOPRESSOR) 100 MG tablet Take 1 tablet (100 mg total) by mouth 2 (two) times daily. 08/07/21   Jerline Pain, MD  mometasone (ELOCON) 0.1 % cream Apply 1 Application topically daily. 10/06/21   Debbrah Alar, NP  Multiple Vitamin (MULTIVITAMIN) tablet Take 1 tablet by mouth daily.    [provider]  nitroGLYCERIN (NITROSTAT) 0.4 MG SL tablet Place 1 tablet (0.4 mg total) under the tongue every 5 (five) minutes as needed. 12/29/21   Jerline Pain, MD  oxycodone (OXY-IR) 5 MG capsule Take 5 mg by mouth 3 (three) times daily as needed for pain.    [provider]  pantoprazole (PROTONIX) 40 MG tablet TAKE 1 TABLET TWICE A DAY BEFORE MEALS 01/21/22   [name], MD  Pitavastatin Calcium (LIVALO) 2 MG TABS Take 1 tablet (2 mg total) by mouth daily. 11/24/21   [name], MD  sodium chloride (OCEAN) 0.65 % SOLN nasal spray Place 1 spray into both nostrils as needed for congestion (nose irritation). 07/08/18   Swayze, Ava, DO      Allergies    Bee venom, Lipitor [atorvastatin], Morphine, Metformin and related, Robaxin [methocarbamol], Baclofen, Medrol [methylprednisolone], Simvastatin, Hydrocodone, and Pravastatin    Review of Systems   Review of Systems  Physical Exam Updated Vital Signs BP (!) 86/50   Pulse 77   Temp 97.9 F (36.6 C) (Oral)   Resp 19   SpO2 97%  Physical Exam Constitutional: Alert and oriented.  Pale and fatigued appearing Eyes: Conjunctivae are pale. ENT      Head: Normocephalic and atraumatic.      Nose: No congestion.      Mouth/Throat: Mucous membranes are moist.       Neck: No stridor. Cardiovascular: S1, S2,  Normal and symmetric distal pulses are present in all extremities.Warm and well perfused. Respiratory: Normal respiratory effort. Breath sounds are normal. Gastrointestinal: Soft and mild epigastrium tenderness, no rebound or guarding, rectal exam with gross melena on exam Musculoskeletal: Normal range of motion in all extremities.      Right lower leg: No tenderness or edema.      Left lower leg: No tenderness or edema. Neurologic: Normal speech and language. No gross focal neurologic deficits are appreciated. Skin: Skin is warm, dry and intact. No rash  noted. Psychiatric: Mood and affect are normal. Speech and behavior are normal.  ED Results / Procedures / Treatments   Labs (all labs ordered are listed, but only abnormal results are displayed) Labs Reviewed  CBC - Abnormal; Notable for the following components:      Result Value   RBC 1.89 (*)    Hemoglobin 5.7 (*)    HCT 17.3 (*)    All other components within normal limits  OCCULT BLOOD X 1 CARD TO LAB, STOOL - Abnormal; Notable for the following components:   Fecal Occult Bld POSITIVE (*)    All other components within normal limits  COMPREHENSIVE METABOLIC PANEL  PROTIME-INR  APTT  ABO/RH  PREPARE RBC (CROSSMATCH)  TROPONIN I (HIGH SENSITIVITY)    EKG EKG Interpretation  Date/Time:  Wednesday February 04 2022 19:00:59 EDT Ventricular Rate:  89 PR Interval:  146 QRS Duration: 72 QT Interval:  381 QTC Calculation: 464 R Axis:   29 Text Interpretation: Sinus rhythm Low voltage, precordial leads Confirmed by Georgina Snell [phone]) on 02/04/2022 7:05:06 PM  Radiology No results found.  Procedures .Critical Care  Performed by: Elgie Congo, MD Authorized by: Elgie Congo, MD   Critical care provider statement:    Critical care time (minutes):  35   Critical care was necessary to treat or prevent imminent or life-threatening deterioration of the  following conditions:  Shock (hemorrhagic shock/GIB)   Critical care was time spent personally by me on the following activities:  Development of treatment plan with patient or surrogate, discussions with consultants, evaluation of patient's response to treatment, examination of patient, ordering and review of laboratory studies, ordering and review of radiographic studies, ordering and performing treatments and interventions, pulse oximetry, re-evaluation of patient's condition, review of old charts and obtaining history from patient or surrogate   Care discussed with: admitting provider     Remain on constant cardiac monitoring, normal sinus rhythm  Medications Ordered in ED Medications  pantoprazole (PROTONIX) 80 mg /NS 100 mL IVPB (80 mg Intravenous New Bag/Given 02/04/22 1850)  pantoprazole (PROTONIX) 40 MG injection (has no administration in time range)  0.9 %  sodium chloride infusion (has no administration in time range)  lactated ringers bolus 1,000 mL (1,000 mLs Intravenous New Bag/Given 02/04/22 1849)    ED Course/ Medical Decision Making/ A&P Clinical Course as of 02/04/22 1906  Wed Feb 04, 2022  1905 Labs reviewed with hemoglobin 5.7.  Called blood bank for emergent release of 2 units PRBCs.  Will call gastroenterology and ICU for admission. [VB]    Clinical Course User Index [VB] Elgie Congo, MD                           Medical Decision Making Brent [name] is a 66 y.o. male.  With PMH of GI bleed secondary to gastric ulcer and Jejunal Marginal Ulcer, CAD on aspirin and Plavix, GERD, DM 2, obesity who presents with 2 days of melena and lightheadedness.  Patient's vital signs were concerning for hypotension 86/51 with normal heart rate which is likely influenced by metoprolol which she takes for underlying cardiac disease.  He is on aspirin and Plavix but no anticoagulation that can be reversed.  He had gross melena on exam but no significant abdominal pain or  tenderness.  I suspect he likely is having an upper GI bleed again secondary to ulcer versus AVM versus gastritis vs Dieulafoys among multiple other etiologies.  I have no concern for varices with no history of cirrhosis or previous findings on previous scopes.  Regarding his chest pain, suspect likely related to esophagitis or gastritis.  EKG obtained normal sinus rhythm with low voltage but no acute ST/T changes concerning for ischemia, low suspicion for atypical ACS. Troponin sent due to history.  We will start patient on IV Protonix and IV fluids.  Likely will require blood.  Patient will require admission for GI evaluation and further monitoring and management of GIB.  Amount and/or Complexity of Data Reviewed Labs: ordered.  Risk Prescription drug management.    Final Clinical Impression(s) / ED Diagnoses Final diagnoses:  Melena  Gastrointestinal hemorrhage, unspecified gastrointestinal hemorrhage type  Anemia, unspecified type    Rx / DC Orders ED Discharge Orders     None

## 2022-02-04 NOTE — ED Triage Notes (Signed)
Abdominal pain started yesterday. Reports dark colored, loose stools, deep purple/black color, X 2 today. Weakness and almost falling History of GI bleed. On plavix and asa

## 2022-02-05 ENCOUNTER — Inpatient Hospital Stay (HOSPITAL_COMMUNITY): Payer: Medicare Other | Admitting: Certified Registered Nurse Anesthetist

## 2022-02-05 ENCOUNTER — Encounter (HOSPITAL_COMMUNITY): Payer: Self-pay | Admitting: Pulmonary Disease

## 2022-02-05 ENCOUNTER — Ambulatory Visit: Payer: Medicare Other | Admitting: Internal Medicine

## 2022-02-05 ENCOUNTER — Encounter (HOSPITAL_COMMUNITY)
Admission: EM | Disposition: A | Discharge status: Home / Self Care | Payer: Self-pay | Source: Home / Self Care | Attending: Internal Medicine

## 2022-02-05 DIAGNOSIS — D649 Anemia, unspecified: Secondary | ICD-10-CM

## 2022-02-05 DIAGNOSIS — K289 Gastrojejunal ulcer, unspecified as acute or chronic, without hemorrhage or perforation: Secondary | ICD-10-CM

## 2022-02-05 DIAGNOSIS — K922 Gastrointestinal hemorrhage, unspecified: Secondary | ICD-10-CM | POA: Diagnosis not present

## 2022-02-05 DIAGNOSIS — K921 Melena: Principal | ICD-10-CM

## 2022-02-05 DIAGNOSIS — I2510 Atherosclerotic heart disease of native coronary artery without angina pectoris: Secondary | ICD-10-CM

## 2022-02-05 DIAGNOSIS — N179 Acute kidney failure, unspecified: Secondary | ICD-10-CM

## 2022-02-05 DIAGNOSIS — Z87891 Personal history of nicotine dependence: Secondary | ICD-10-CM

## 2022-02-05 DIAGNOSIS — I10 Essential (primary) hypertension: Secondary | ICD-10-CM | POA: Diagnosis not present

## 2022-02-05 HISTORY — PX: HEMOSTASIS CLIP PLACEMENT: SHX6857

## 2022-02-05 HISTORY — PX: ESOPHAGOGASTRODUODENOSCOPY (EGD) WITH PROPOFOL: SHX5813

## 2022-02-05 HISTORY — PX: SCLEROTHERAPY: SHX6841

## 2022-02-05 LAB — TYPE AND SCREEN
ABO/RH(D): A POS
Antibody Screen: NEGATIVE
Unit division: 0
Unit division: 0

## 2022-02-05 LAB — BASIC METABOLIC PANEL
Anion gap: 6 (ref 5–15)
BUN: 31 mg/dL — ABNORMAL HIGH (ref 8–23)
CO2: 24 mmol/L (ref 22–32)
Calcium: 8.4 mg/dL — ABNORMAL LOW (ref 8.9–10.3)
Chloride: 110 mmol/L (ref 98–111)
Creatinine, Ser: 1.26 mg/dL — ABNORMAL HIGH (ref 0.61–1.24)
GFR, Estimated: >60 mL/min (ref >60)
Glucose, Bld: 137 mg/dL — ABNORMAL HIGH (ref 70–99)
Potassium: 4 mmol/L (ref 3.5–5.1)
Sodium: 140 mmol/L (ref 135–145)

## 2022-02-05 LAB — MAGNESIUM: Magnesium: 1.8 mg/dL (ref 1.7–2.4)

## 2022-02-05 LAB — CBC
HCT: 22.1 % — ABNORMAL LOW (ref 39.0–52.0)
HCT: 22.2 % — ABNORMAL LOW (ref 39.0–52.0)
HCT: 25.2 % — ABNORMAL LOW (ref 39.0–52.0)
Hemoglobin: 7.1 g/dL — ABNORMAL LOW (ref 13.0–17.0)
Hemoglobin: 7.4 g/dL — ABNORMAL LOW (ref 13.0–17.0)
Hemoglobin: 8.2 g/dL — ABNORMAL LOW (ref 13.0–17.0)
MCH: 29.7 pg (ref 26.0–34.0)
MCH: 30.2 pg (ref 26.0–34.0)
MCH: 30.5 pg (ref 26.0–34.0)
MCHC: 32.1 g/dL (ref 30.0–36.0)
MCHC: 33.3 g/dL (ref 30.0–36.0)
MCHC: 32.5 g/dL (ref 30.0–36.0)
MCV: 92.5 fL (ref 80.0–100.0)
MCV: 90.6 fL (ref 80.0–100.0)
MCV: 93.7 fL (ref 80.0–100.0)
Platelets: 159 10*3/uL (ref 150–400)
Platelets: 150 10*3/uL (ref 150–400)
Platelets: 159 10*3/uL (ref 150–400)
RBC: 2.39 MIL/uL — ABNORMAL LOW (ref 4.22–5.81)
RBC: 2.45 MIL/uL — ABNORMAL LOW (ref 4.22–5.81)
RBC: 2.69 MIL/uL — ABNORMAL LOW (ref 4.22–5.81)
RDW: 14.6 % (ref 11.5–15.5)
RDW: 14.5 % (ref 11.5–15.5)
RDW: 14.6 % (ref 11.5–15.5)
WBC: 3.7 10*3/uL — ABNORMAL LOW (ref 4.0–10.5)
WBC: 3.7 10*3/uL — ABNORMAL LOW (ref 4.0–10.5)
WBC: 3.3 10*3/uL — ABNORMAL LOW (ref 4.0–10.5)
nRBC: 0 % (ref 0.0–0.2)
nRBC: 0 % (ref 0.0–0.2)
nRBC: 0.6 % — ABNORMAL HIGH (ref 0.0–0.2)

## 2022-02-05 LAB — BPAM RBC
Blood Product Expiration Date: 202311182359
Blood Product Expiration Date: 202311182359
ISSUE DATE / TIME: 202310181900
ISSUE DATE / TIME: 202310181907
Unit Type and Rh: 9500
Unit Type and Rh: 9500

## 2022-02-05 SURGERY — ESOPHAGOGASTRODUODENOSCOPY (EGD) WITH PROPOFOL
Anesthesia: Monitor Anesthesia Care

## 2022-02-05 MED ORDER — CLOPIDOGREL BISULFATE 75 MG PO TABS
75.0000 mg | ORAL_TABLET | Freq: Every day | ORAL | Status: DC
  Administered 2022-02-06 – 2022-02-07 (×2): 75 mg via ORAL
  Filled 2022-02-05 (×2): qty 1

## 2022-02-05 MED ORDER — PROPOFOL 10 MG/ML IV BOLUS
INTRAVENOUS | Status: DC | PRN
  Administered 2022-02-05 (×6): 20 mg via INTRAVENOUS

## 2022-02-05 MED ORDER — MAGNESIUM SULFATE 2 GM/50ML IV SOLN
2.0000 g | Freq: Once | INTRAVENOUS | Status: AC
  Administered 2022-02-05: 2 g via INTRAVENOUS
  Filled 2022-02-05: qty 50

## 2022-02-05 MED ORDER — PROPOFOL 500 MG/50ML IV EMUL
INTRAVENOUS | Status: AC
  Filled 2022-02-05: qty 50

## 2022-02-05 MED ORDER — PROPOFOL 1000 MG/100ML IV EMUL
INTRAVENOUS | Status: AC
  Filled 2022-02-05: qty 100

## 2022-02-05 MED ORDER — PROPOFOL 500 MG/50ML IV EMUL
INTRAVENOUS | Status: DC | PRN
  Administered 2022-02-05: 125 ug/kg/min via INTRAVENOUS

## 2022-02-05 MED ORDER — LIDOCAINE HCL (CARDIAC) PF 100 MG/5ML IV SOSY
PREFILLED_SYRINGE | INTRAVENOUS | Status: DC | PRN
  Administered 2022-02-05: 100 mg via INTRAVENOUS

## 2022-02-05 MED ORDER — SODIUM CHLORIDE (PF) 0.9 % IJ SOLN
PREFILLED_SYRINGE | INTRAMUSCULAR | Status: DC | PRN
  Administered 2022-02-05: 2.5 mL

## 2022-02-05 MED ORDER — SODIUM CHLORIDE 0.9 % IV SOLN: INTRAVENOUS | Status: DC

## 2022-02-05 MED ORDER — SUCRALFATE 1 GM/10ML PO SUSP
1.0000 g | Freq: Three times a day (TID) | ORAL | Status: DC
  Administered 2022-02-05 – 2022-02-07 (×8): 1 g via ORAL
  Filled 2022-02-05 (×8): qty 10

## 2022-02-05 MED ORDER — PANTOPRAZOLE SODIUM 40 MG IV SOLR
40.0000 mg | Freq: Two times a day (BID) | INTRAVENOUS | Status: DC
  Administered 2022-02-05 – 2022-02-06 (×2): 40 mg via INTRAVENOUS
  Filled 2022-02-05 (×2): qty 10

## 2022-02-05 MED ORDER — EPINEPHRINE 1 MG/10ML IJ SOSY
PREFILLED_SYRINGE | INTRAMUSCULAR | Status: AC
  Filled 2022-02-05: qty 10

## 2022-02-05 SURGICAL SUPPLY — 15 items

## 2022-02-05 NOTE — Op Note (Signed)
[facility] Patient Name: Brent [name] Procedure Date: 02/05/2022 MRN: [id number] Attending MD: Gerrit Heck , MD Date of Birth: Sep 25, 1955 CSN: [id number] Age: 66 Admit Type: Inpatient Procedure:                Upper GI endoscopy Indications:              Acute post hemorrhagic anemia, Melena, Symptomatic                            anemia, History of marginal ulcer disease Providers:                Gerrit Heck, MD, Benay Pillow, RN, Benetta Spar, Technician Referring MD:              Medicines:                Monitored Anesthesia Care Complications:            No immediate complications. Estimated Blood Loss:     Estimated blood loss was minimal. Procedure:                Pre-Anesthesia Assessment:                           - Prior to the procedure, a History and Physical                            was performed, and patient medications and                            allergies were reviewed. The patient's tolerance of                            previous anesthesia was also reviewed. The risks                            and benefits of the procedure and the sedation                            options and risks were discussed with the patient.                            All questions were answered, and informed consent                            was obtained. Prior Anticoagulants: The patient has                            taken Plavix (clopidogrel), last dose was 2 days                            prior to procedure. ASA Grade Assessment: III - A  patient with severe systemic disease. After                            reviewing the risks and benefits, the patient was                            deemed in satisfactory condition to undergo the                            procedure.                           After obtaining informed consent, the endoscope was                            passed under direct  vision. Throughout the                            procedure, the patient's blood pressure, pulse, and                            oxygen saturations were monitored continuously. The                            [id number]-[id number] ([id number] Olympus endoscope was introduced                            through the mouth, and advanced to the jejunum. The                            upper GI endoscopy was accomplished without                            difficulty. The patient tolerated the procedure                            well. Scope In: Scope Out: Findings:      The examined esophagus was normal.      Evidence of a Roux-en-Y gastrojejunostomy was found. The anastamosis was       traversed. The gastrojejunal anastomosis was characterized by a single 5       mm, clean-based marginal ulcer. Given need to resume antiplatelet       therapy, I elected for endoscopic intervention to reduce risk of       rebleeding. In total, three hemostatic clips were successfully placed       (MR conditional). Area was successfully injected with 3 mL of a 1:10,000       solution of epinephrine with appropriate mucosal blanching. Estimated       blood loss was minimal. No exposed surgical staples noted.      Normal mucosa was found in the gastric pouch.      The examined jejunum was normal. Impression:               - Normal esophagus.                           -  Roux-en-Y gastrojejunostomy with gastrojejunal                            anastomosis characterized by marginal ulceration.                            Clips (MR conditional) were placed. Injected with                            epinephrine.                           - Normal mucosa was found in the gastric fundus and                            in the gastric body.                           - Normal examined jejunum.                           - No specimens collected. Moderate Sedation:      Not Applicable - Patient had care per Anesthesia. Recommendation:            - Return patient to hospital ward for ongoing care.                           - Advance diet as tolerated.                           - Continue Protonix (pantoprazole) 40 mg IV BID                            today, then if no rebleeding, will transition to                            Protonix (pantoprazole) 40 mg PO BID.                           - Use sucralfate suspension 1 gram PO QID for 4                            weeks.                           - Given recent PCI, patient needs to be                            expeditiously restarted on antiplatelet therapy. I                            recommend resuming Plavix (clopidogrel) at prior                            dose tomorrow.                           -  Obtain Cardiology input regarding possibly                            holding off on restarting ASA 81 mg for 2 weeks.                           - Continue serial CBC checks with additional blood                            products as needed per protocol                           - Inpatient GI service will continue to follow                           - Due to recent PCI, will make decision regarding                            optimal timing of repeat upper endoscopy as                            outpatient to check ulcer healing at follow-up                            appointment. Procedure Code(s):        --- Professional ---                           [phone], Esophagogastroduodenoscopy, flexible,                            transoral; with control of bleeding, any method Diagnosis Code(s):        --- Professional ---                           Z98.0, Intestinal bypass and anastomosis status                           D62, Acute posthemorrhagic anemia                           K92.1, Melena (includes Hematochezia) CPT copyright 2019 [employer]. All rights reserved. The codes documented in this report are preliminary and upon coder review may  be revised to meet  current compliance requirements. Gerrit Heck, MD 02/05/2022 1:47:21 PM Number of Addenda: 0

## 2022-02-05 NOTE — H&P (Deleted)
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 Reports normal health prior to this except for recent cardiac stent placement two months ago. Began having dizziness and weakness on day of admission associated with dark tarry stools that increased in volume with each BM. Denies syncope, shortness of breath, chest pain. Reports this is similar to his ulcer bleed in March 2020.  Pertinent  Medical History  DM2, HTN, OSA, HLD, hx Roux-en-Y gastric bypass, IDA  Significant Hospital Events: Including procedures, antibiotic start and stop dates in addition to other pertinent events   10/18 - Admitted for GI bleed. Transfused PRBC x 2. HDS  Interim History / Subjective:  No further BM since admitted. Hgb 5.7 -- 7.4 s/p 2u pRBC. No pain in belly.  Objective   Blood pressure (!) 105/53, pulse 85, temperature 98.8 F (37.1 C), temperature source Oral, resp. rate 14, weight 112.5 kg, SpO2 97 %.        Intake/Output Summary (Last 24 hours) at 02/05/2022 0848 Last data filed at 02/05/2022 4097 Gross per 24 hour  Intake 3510.11 ml  Output 2350 ml  Net 1160.11 ml    Filed Weights   02/05/22 0500  Weight: 112.5 kg    Physical Exam: General: Well-appearing, no acute distress HENT: [place], AT Eyes: EOMI, no scleral icterus Respiratory: Clear to auscultation bilaterally.  No crackles, wheezing or rales Cardiovascular: RRR, -M/R/G, no JVD Abd: NT, ND, +BS Extremities:-Edema,-tenderness Neuro: AAO x4, CNII-XII grossly intact Psych: Normal mood, normal affect   Resolved Hospital Problem list   N/A  Assessment & Plan:  Acute blood loss anemia.  Admission Hg 5.7. Baseline 11-13. S/p PRBC x 2 with improved hgb 7.4. 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 Colonoscopy with two 2-20m polyps 08/2021. --PPI BID --Trend CBC --LR 75cc --Appreciate GI assistance, case discussed via phone  CAD s/p recent stent to RCA 11/27/2021  HTN --Holding DAPT, will need to re-start prior to discharge and monitor blood counts --Holding antihypertensive agents  AKI, CKD II --Cr improved with IVF to baseline --Trend UOP/Cr --Avoid nephrotoxic agents  GERD Hx ulcer --PPI  Best Practice (right click and "Reselect all SmartList Selections" daily)   Diet/type: NPO DVT prophylaxis: other contraindicated with bleed GI prophylaxis: PPI Lines: N/A Foley:  N/A Code Status:  full code Last date of multidisciplinary goals of care discussion '[]'$   Labs   CBC: Recent Labs  Lab 02/04/22 1846 02/05/22 0502 02/05/22 0607  WBC 6.9 3.7* 3.7*  HGB 5.7* 7.1* 7.4*  HCT 17.3* 22.1* 22.2*  MCV 91.5 92.5 90.6  PLT 195 159 150     Basic Metabolic Panel: Recent Labs  Lab 02/04/22 1846 02/05/22 0502  NA 138 140  K 4.4 4.0  CL 106 110  CO2 24 24  GLUCOSE 137* 137*  BUN 41* 31*  CREATININE 1.61* 1.26*  CALCIUM 8.6* 8.4*  MG  --  1.8    GFR: Estimated Creatinine Clearance: 70.1 mL/min (A) (by C-G formula based on SCr of 1.26 mg/dL (H)). Recent Labs  Lab 02/04/22 1846 02/05/22 0502 02/05/22 0607  WBC  6.9 3.7* 3.7*     Liver Function Tests: Recent Labs  Lab 02/04/22 1846  AST 14*  ALT 11  ALKPHOS 21*  BILITOT 0.3  PROT 5.4*  ALBUMIN 3.9    No results for input(s): "LIPASE", "AMYLASE" in the last 168 hours. No results for input(s): "AMMONIA" in the last 168 hours.  ABG    Component Value Date/Time   TCO2 21 03/28/2013 1857     Coagulation Profile: Recent Labs  Lab 02/04/22 1815  INR 1.1     Cardiac Enzymes: No results for input(s): "CKTOTAL", "CKMB", "CKMBINDEX", "TROPONINI" in  the last 168 hours.  HbA1C: Hemoglobin A1C  Date/Time Value Ref Range Status  12/10/2021 12:00 AM 6 %  Final   Hgb A1c MFr Bld  Date/Time Value Ref Range Status  09/24/2020 11:15 AM 8.0 (H) 4.8 - 5.6 % Final    Comment:    (NOTE)         Prediabetes: 5.7 - 6.4         Diabetes: >6.4         Glycemic control for adults with diabetes: <7.0   01/11/2020 03:43 PM 7.7 (H) <5.7 % of total Hgb Final    Comment:    For someone without known diabetes, a hemoglobin A1c value of 6.5% or greater indicates that they may have  diabetes and this should be confirmed with a follow-up  test. . For someone with known diabetes, a value <7% indicates  that their diabetes is well controlled and a value  greater than or equal to 7% indicates suboptimal  control. A1c targets should be individualized based on  duration of diabetes, age, comorbid conditions, and  other considerations. . Currently, no consensus exists regarding use of hemoglobin A1c for diagnosis of diabetes for children. .     CBG: Recent Labs  Lab 02/04/22 2059  GLUCAP 116*     Review of Systems:      Past Medical History:  He,  has a past medical history of Acid reflux disease, ACID REFLUX DISEASE (07/05/2007), Acute gastric ulcer with bleeding, Alcohol abuse, Anemia, Atherosclerosis, Benign neoplasm of colon (07/05/2007), Bilateral hip pain (10/05/2016), Bladder cancer ([place]), Breast pain, left (11/17/2011), CAD (coronary artery disease), Chest pain, COLONIC POLYPS, HX OF (10/29/2008), Diarrhea (06/13/2010), Diverticulosis (07/05/2018), DIVERTICULOSIS, COLON (10/29/2008), DM (diabetes mellitus), type 2, uncontrolled, GI bleed, Gout (03/04/2013), Hearing loss (05/24/2013), HTN (hypertension) (07/14/2010), colonic polyps, HYPERSOMNIA, ASSOCIATED WITH SLEEP APNEA (07/26/2008), Hypertension (07/14/2010), 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   Surgical History:   Past Surgical History:  Procedure Laterality Date   ANKLE SURGERY Right 1994   BICEPS TENDON REPAIR     left side   CARDIAC CATHETERIZATION     denies any chest pain in the past 2 years   COLONOSCOPY     colonoscopy polyps     CORONARY STENT INTERVENTION N/A 11/28/2021   Procedure: CORONARY STENT INTERVENTION;  Surgeon: Martinique, Peter M, MD;  Location: [facility];  Service: Cardiovascular;  Laterality: N/A;   ESOPHAGOGASTRODUODENOSCOPY N/A 03/27/2013   Procedure: ESOPHAGOGASTRODUODENOSCOPY (EGD);  Surgeon: Irene Shipper, MD;  Location: [facility];  Service: Endoscopy;  Laterality: N/A;   ESOPHAGOGASTRODUODENOSCOPY (EGD) WITH PROPOFOL N/A 07/06/2018   Procedure: ESOPHAGOGASTRODUODENOSCOPY (EGD) WITH PROPOFOL;  Surgeon: Jerene Bears, MD;  Location: [facility];  Service: Gastroenterology;  Laterality: N/A;   GASTRIC BYPASS     HERNIA REPAIR     HOT HEMOSTASIS N/A 07/06/2018   Procedure: HOT HEMOSTASIS (ARGON PLASMA COAGULATION/BICAP);  Surgeon: Jerene Bears, MD;  Location: [facility];  Service: Gastroenterology;  Laterality: N/A;   KNEE ARTHROSCOPY Left 11/06/2010   Left, torn meniscus (repaired)   LEFT HEART CATH AND CORONARY ANGIOGRAPHY N/A 11/28/2021   Procedure: LEFT HEART CATH AND CORONARY ANGIOGRAPHY;  Surgeon: Martinique, Peter M, MD;  Location: [facility];  Service: Cardiovascular;  Laterality: N/A;   LEFT HEART CATHETERIZATION WITH CORONARY ANGIOGRAM N/A 12/23/2011   Procedure: LEFT HEART CATHETERIZATION WITH CORONARY ANGIOGRAM;  Surgeon: Minus Breeding, MD;  Location: [facility];  Service: Cardiovascular;  Laterality: N/A;   REPLACEMENT TOTAL KNEE Right 01/2016   removed scar tissue/ cut tip of  nerve bundle and re-route   right knee arthroscopy Right 07/05/14   Dr. Hart Robinsons, [place].   ROTATOR CUFF REPAIR  2019   left   SCHLEROTHERAPY  07/06/2018   Procedure: SCHLEROTHERAPY;  Surgeon: Jerene Bears, MD;  Location: [facility];  Service: Gastroenterology;;   TONSILLECTOMY  age 22   TONSILLECTOMY     as a child   TOTAL KNEE ARTHROPLASTY Right 01/20/2016   Procedure: RIGHT TOTAL KNEE ARTHROPLASTY;  Surgeon: Paralee Cancel, MD;  Location: [facility];  Service: Orthopedics;  Laterality: Right;   TRANSURETHRAL RESECTION OF BLADDER TUMOR N/A 09/24/2020   Procedure: TRANSURETHRAL RESECTION OF BLADDER TUMOR (TURBT)  RIGHT retrograde pylegram;  Surgeon: [name], MD;  Location: [facility];  Service: Urology;  Laterality: N/A;   UPPER GI ENDOSCOPY  03/27/13     Social History:   reports that he quit smoking about 30 years ago. His smoking use included cigarettes. He has a 30.00 pack-year smoking history. He has never used smokeless tobacco. He reports current alcohol use of about 3.0 standard drinks of alcohol per week. He reports that he does not use drugs.   Family History:  His family history includes ADD / ADHD in his daughter; Colon polyps in his father; Diabetes in his brother, brother, brother, mother, and sister; Heart attack in his brother and brother; Heart attack (age of onset: 66) in his father; Heart disease in his brother, brother, and brother; Hyperlipidemia in his mother; Hypertension in his father, maternal grandmother, and mother; Obesity in his brother; Stroke in his father, mother, and sister. There is no history of Stomach cancer, Colon cancer, Esophageal cancer, or Rectal cancer.   Allergies Allergies  Allergen Reactions   Bee Venom Anaphylaxis   Lipitor [Atorvastatin] Other (See Comments)    Myalgias, memory changes   Morphine Other (See Comments)    hyperactive   Metformin And Related Other (See Comments)    Myalgias and weakness   Lioresal [Baclofen] Other  (See Comments)    Acted drunk   Medrol [Methylprednisolone] Other (See Comments)    Hyperglycemia    Zocor [Simvastatin] Other (See Comments)    Joint pain   Hydrocodone Itching   Latex Rash   Pravachol [Pravastatin] Other (See Comments)    Joint pain     Home Medications  Prior to Admission medications   Medication Sig Start Date End Date Taking? Authorizing Provider  acetaminophen (TYLENOL) 500 MG tablet Take 1,000 mg by mouth every 6 (six) hours as needed for moderate pain or headache.    [provider]  amitriptyline (ELAVIL) 50 MG tablet TAKE 1 TABLET AT BEDTIME 07/03/21   [name], MD  amoxicillin (AMOXIL) 500 MG capsule Take 2,000 mg by mouth See admin instructions.  TAKE 4 CAPSULES BY MOUTH PRIOR TO APPOINTMENT    [provider]  aspirin EC 81 MG tablet Take 1 tablet (81 mg total) by mouth daily. Swallow whole. 11/24/21   Finis Bud, NP  Cholecalciferol (VITAMIN D) 50 MCG (2000 UT) tablet Take 6,000 Units by mouth daily.    [provider]  clopidogrel (PLAVIX) 75 MG tablet Take 1 tablet (75 mg total) by mouth daily with breakfast. 01/20/22   Jerline Pain, MD  Cyanocobalamin (VITAMIN B-12) 1000 MCG SUBL Place 1,000 mcg under the tongue 2 (two) times a week.    [provider]  Dulaglutide (TRULICITY) 1.5 UG/8.9VQ SOPN Inject 1.5 mg into the skin every Friday. Trulicity 1.5 XI/5.0 mL subcutaneous pen injector    [provider]  econazole nitrate 1 % cream Apply topically daily. 10/06/21   Debbrah Alar, NP  EPINEPHrine 0.3 mg/0.3 mL IJ SOAJ injection INJECT 0.3 MLS (0.3 MG TOTAL) INTO THE MUSCLE ONCE FOR 1 DOSE 10/06/21   Debbrah Alar, NP  ezetimibe (ZETIA) 10 MG tablet Take 1 tablet (10 mg total) by mouth daily. 01/15/22   Jerline Pain, MD  famotidine (PEPCID) 40 MG tablet TAKE 1 TABLET AT BEDTIME AS NEEDED FOR HEARTBURN OR INDIGESTION 10/09/21   [name], MD  fenofibrate 160 MG tablet Take 1 tablet (160 mg  total) by mouth daily. 12/23/21   [name], MD  Ferrous Gluconate-C-Folic Acid (IRON-C PO) Take 1 tablet by mouth daily. Iron 65 mg and vit C-125 mg    [provider]  fluticasone (FLONASE) 50 MCG/ACT nasal spray USE 2 SPRAYS IN EACH NOSTRIL DAILY 12/10/21   [name], MD  gabapentin (NEURONTIN) 300 MG capsule TAKE 1 CAPSULE TWICE A DAY AND 2 CAPSULES AT BEDTIME 10/08/21   [name], MD  glucose blood (FREESTYLE LITE) test strip Use to check blood sugar 4 times day.  Dx Code: E11.9 09/10/20   [name], MD  hydrochlorothiazide (MICROZIDE) 12.5 MG capsule Take 1 capsule (12.5 mg total) by mouth daily. 11/24/21   Finis Bud, NP  insulin lispro (HUMALOG) 100 UNIT/ML KwikPen Inject 2-6 Units into the skin 3 (three) times daily. Per sliding scale. 10/31/20   [name], MD  Insulin Pen Needle (PEN NEEDLES 31GX5/16") 31G X 8 MM MISC Use as directed with Humalog 3 times a day and at bedtime 09/09/20   [name], MD  isosorbide mononitrate (IMDUR) 30 MG 24 hr tablet Take 2 tablets (60 mg total) by mouth daily. 01/26/22   Jerline Pain, MD  losartan (COZAAR) 50 MG tablet TAKE 1 TABLET DAILY (KEEP UPCOMING APPOINTMENT IN MARCH 2023 WITH CARDIOLOGIST BEFORE ANYMORE REFILLS) 09/08/21   Jerline Pain, MD  Magnesium 100 MG CAPS Take 1 capsule (100 mg total) by mouth daily. 06/29/17   [name], MD  metoprolol tartrate (LOPRESSOR) 100 MG tablet Take 1 tablet (100 mg total) by mouth 2 (two) times daily. 08/07/21   Jerline Pain, MD  mometasone (ELOCON) 0.1 % cream Apply 1 Application topically daily. 10/06/21   Debbrah Alar, NP  Multiple Vitamin (MULTIVITAMIN) tablet Take 1 tablet by mouth daily.    [provider]  nitroGLYCERIN (NITROSTAT) 0.4 MG SL tablet Place 1 tablet (0.4 mg total) under the tongue  every 5 (five) minutes as needed. 12/29/21   Jerline Pain, MD  oxycodone (OXY-IR) 5 MG capsule Take 5 mg by mouth 3 (three) times daily as needed for  pain.    [provider]  pantoprazole (PROTONIX) 40 MG tablet TAKE 1 TABLET TWICE A DAY BEFORE MEALS 01/21/22   [name], MD  Pitavastatin Calcium (LIVALO) 2 MG TABS Take 1 tablet (2 mg total) by mouth daily. 11/24/21   [name], MD  sodium chloride (OCEAN) 0.65 % SOLN nasal spray Place 1 spray into both nostrils as needed for congestion (nose irritation). 07/08/18   Karie Kirks, DO     Critical care time:        Lanier Clam, MD [place] 02/05/2022 8:48 AM   Please see Amion for contact number. Please page  on-call Pulmonary and Critical Care Team if no response. After 7 pm, contact E-link.

## 2022-02-05 NOTE — TOC Progression Note (Signed)
Transition of Care [facility]) - Progression Note    Patient Details  Name: Brent [name] MRN: [id number] Date of Birth: October 14, 1955  Transition of Care [facility]) CM/SW Contact  Purcell Mouton, RN Phone Number: 02/05/2022, 8:53 AM  Clinical Narrative:      Transition of Care [facility]) Screening Note   Patient Details  Name: Brent [name] Date of Birth: 1956/01/05   Transition of Care [facility]) CM/SW Contact:    Purcell Mouton, RN Phone Number: 02/05/2022, 8:53 AM    Transition of Care Department [facility]) has reviewed patient and no TOC needs have been identified at this time. We will continue to monitor patient advancement through interdisciplinary progression rounds. If new patient transition needs arise, please place a TOC consult.         Expected Discharge Plan and Services                                                 Social Determinants of Health (SDOH) Interventions    Readmission Risk Interventions     No data to display

## 2022-02-05 NOTE — Progress Notes (Addendum)
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 Reports normal health prior to this except for recent cardiac stent placement two months ago. Began having dizziness and weakness on day of admission associated with dark tarry stools that increased in volume with each BM. Denies syncope, shortness of breath, chest pain. Reports this is similar to his ulcer bleed in March 2020.  Pertinent  Medical History  DM2, HTN, OSA, HLD, hx Roux-en-Y gastric bypass, IDA  Significant Hospital Events: Including procedures, antibiotic start and stop dates in addition to other pertinent events   10/18 - Admitted for GI bleed. Transfused PRBC x 2. HDS  Interim History / Subjective:  No further BM since admitted. Hgb 5.7 -- 7.4 s/p 2u pRBC. No pain in belly.  Objective   Blood pressure (!) 180/91, pulse (!) 107, temperature 98.9 F (37.2 C), temperature source Oral, resp. rate (!) 28, weight 112.5 kg, SpO2 99 %.        Intake/Output Summary (Last 24 hours) at 02/05/2022 1633 Last data filed at 02/05/2022 1458 Gross per 24 hour  Intake 3906.63 ml  Output 4000 ml  Net -93.37 ml    Filed Weights   02/05/22 0500  Weight: 112.5 kg    Physical Exam: General: Well-appearing, no acute distress HENT: [place], AT Eyes: EOMI, no scleral icterus Respiratory: Clear to auscultation bilaterally.  No crackles, wheezing or rales Cardiovascular: RRR, -M/R/G, no JVD Abd: NT, ND, +BS Extremities:-Edema,-tenderness Neuro: AAO x4, CNII-XII grossly intact Psych: Normal mood, normal affect   Resolved Hospital Problem list   N/A  Assessment & Plan:  Acute blood loss anemia.  Admission Hg 5.7. Baseline 11-13. S/p PRBC x 2 with improved hgb 7.4. 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 Colonoscopy with two 2-26m polyps 08/2021. --PPI IV BID --Trend CBC --Appreciate GI assistance, case discussed via phone and secure chat, s/p EGD with ulcer treated with clip x 3 and epi  CAD s/p recent stent to RCA 11/27/2021  HTN --Holding DAPT today --Resume plavix 10/20 --Plan for cardiology consult 10/20 for recommendations on timeline for resuming ASA 81 mg daily --Holding antihypertensive agents  AKI, CKD II --Cr improved with IVF to baseline --Trend UOP/Cr --Avoid nephrotoxic agents  GERD Hx ulcer --PPI  Best Practice (right click and "Reselect all SmartList Selections" daily)   Diet/type: NPO DVT prophylaxis: other contraindicated with bleed GI prophylaxis: PPI Lines: N/A Foley:  N/A Code Status:  full code Last date of multidisciplinary goals of care discussion '[]'$   Labs   CBC: Recent Labs  Lab 02/04/22 1846 02/05/22 0502 02/05/22 0607 02/05/22 1431  WBC 6.9 3.7* 3.7* 3.3*  HGB 5.7* 7.1* 7.4* 8.2*  HCT 17.3* 22.1* 22.2* 25.2*  MCV 91.5 92.5 90.6 93.7  PLT 195 159 150 159     Basic Metabolic Panel: Recent Labs  Lab 02/04/22 1846 02/05/22 0502  NA 138 140  K 4.4 4.0  CL 106 110  CO2 24 24  GLUCOSE 137* 137*  BUN 41* 31*  CREATININE 1.61* 1.26*  CALCIUM 8.6* 8.4*  MG  --  1.8  GFR: Estimated Creatinine Clearance: 70.1 mL/min (A) (by C-G formula based on SCr of 1.26 mg/dL (H)). Recent Labs  Lab 02/04/22 1846 02/05/22 0502 02/05/22 0607 02/05/22 1431  WBC 6.9 3.7* 3.7* 3.3*     Liver Function Tests: Recent Labs  Lab 02/04/22 1846  AST 14*  ALT 11  ALKPHOS 21*  BILITOT 0.3  PROT 5.4*  ALBUMIN 3.9    No results for input(s): "LIPASE", "AMYLASE" in the last 168 hours. No results for input(s): "AMMONIA" in the last 168 hours.  ABG    Component Value Date/Time   TCO2 21  03/28/2013 1857     Coagulation Profile: Recent Labs  Lab 02/04/22 1815  INR 1.1     Cardiac Enzymes: No results for input(s): "CKTOTAL", "CKMB", "CKMBINDEX", "TROPONINI" in the last 168 hours.  HbA1C: Hemoglobin A1C  Date/Time Value Ref Range Status  12/10/2021 12:00 AM 6 %  Final   Hgb A1c MFr Bld  Date/Time Value Ref Range Status  09/24/2020 11:15 AM 8.0 (H) 4.8 - 5.6 % Final    Comment:    (NOTE)         Prediabetes: 5.7 - 6.4         Diabetes: >6.4         Glycemic control for adults with diabetes: <7.0   01/11/2020 03:43 PM 7.7 (H) <5.7 % of total Hgb Final    Comment:    For someone without known diabetes, a hemoglobin A1c value of 6.5% or greater indicates that they may have  diabetes and this should be confirmed with a follow-up  test. . For someone with known diabetes, a value <7% indicates  that their diabetes is well controlled and a value  greater than or equal to 7% indicates suboptimal  control. A1c targets should be individualized based on  duration of diabetes, age, comorbid conditions, and  other considerations. . Currently, no consensus exists regarding use of hemoglobin A1c for diagnosis of diabetes for children. .     CBG: Recent Labs  Lab 02/04/22 2059  GLUCAP 116*     Review of Systems:      Past Medical History:  He,  has a past medical history of Acid reflux disease, ACID REFLUX DISEASE (07/05/2007), Acute gastric ulcer with bleeding, Alcohol abuse, Anemia, Atherosclerosis, Benign neoplasm of colon (07/05/2007), Bilateral hip pain (10/05/2016), Bladder cancer ([place]), Breast pain, left (11/17/2011), CAD (coronary artery disease), Chest pain, COLONIC POLYPS, HX OF (10/29/2008), Diarrhea (06/13/2010), Diverticulosis (07/05/2018), DIVERTICULOSIS, COLON (10/29/2008), DM (diabetes mellitus), type 2, uncontrolled, GI bleed, Gout (03/04/2013), Hearing loss (05/24/2013), HTN (hypertension) (07/14/2010), colonic polyps, HYPERSOMNIA,  ASSOCIATED WITH SLEEP APNEA (07/26/2008), Hypertension (07/14/2010), 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) (03/27/2010), Neck pain (03/2015), Other and unspecified hyperlipidemia (11/16/2012), Post-operative nausea and vomiting, Preventative health care (11/17/2011), Renal stone (11/2013), Sleep apnea, Spinal stenosis, and Tear of meniscus of left knee (2012).   Surgical History:   Past Surgical History:  Procedure Laterality Date   ANKLE SURGERY Right 1994   BICEPS TENDON REPAIR     left side   CARDIAC CATHETERIZATION     denies any chest pain in the past 2 years   COLONOSCOPY     colonoscopy polyps     CORONARY STENT INTERVENTION N/A 11/28/2021   Procedure: CORONARY STENT INTERVENTION;  Surgeon: Martinique, Peter M, MD;  Location: [facility];  Service: Cardiovascular;  Laterality: N/A;   ESOPHAGOGASTRODUODENOSCOPY N/A 03/27/2013   Procedure: ESOPHAGOGASTRODUODENOSCOPY (  EGD);  Surgeon: Irene Shipper, MD;  Location: [facility];  Service: Endoscopy;  Laterality: N/A;   ESOPHAGOGASTRODUODENOSCOPY (EGD) WITH PROPOFOL N/A 07/06/2018   Procedure: ESOPHAGOGASTRODUODENOSCOPY (EGD) WITH PROPOFOL;  Surgeon: Jerene Bears, MD;  Location: [facility];  Service: Gastroenterology;  Laterality: N/A;   GASTRIC BYPASS     HERNIA REPAIR     HOT HEMOSTASIS N/A 07/06/2018   Procedure: HOT HEMOSTASIS (ARGON PLASMA COAGULATION/BICAP);  Surgeon: Jerene Bears, MD;  Location: [facility];  Service: Gastroenterology;  Laterality: N/A;   KNEE ARTHROSCOPY Left 11/06/2010   Left, torn meniscus (repaired)   LEFT HEART CATH AND CORONARY ANGIOGRAPHY N/A 11/28/2021   Procedure: LEFT HEART CATH AND CORONARY ANGIOGRAPHY;  Surgeon: Martinique, Peter M, MD;  Location: [facility];  Service: Cardiovascular;  Laterality: N/A;   LEFT HEART CATHETERIZATION WITH CORONARY ANGIOGRAM N/A 12/23/2011   Procedure: LEFT HEART CATHETERIZATION WITH CORONARY  ANGIOGRAM;  Surgeon: Minus Breeding, MD;  Location[facility];  Service: Cardiovascular;  Laterality: N/A;   REPLACEMENT TOTAL KNEE Right 01/2016   removed scar tissue/ cut tip of nerve bundle and re-route   right knee arthroscopy Right 07/05/14   Dr. Hart Robinsons, [place].   ROTATOR CUFF REPAIR  2019   left   SCHLEROTHERAPY  07/06/2018   Procedure: SCHLEROTHERAPY;  Surgeon: Jerene Bears, MD;  Location: [facility];  Service: Gastroenterology;;   TONSILLECTOMY  age 1   TONSILLECTOMY     as a child   TOTAL KNEE ARTHROPLASTY Right 01/20/2016   Procedure: RIGHT TOTAL KNEE ARTHROPLASTY;  Surgeon: Paralee Cancel, MD;  Location: [facility];  Service: Orthopedics;  Laterality: Right;   TRANSURETHRAL RESECTION OF BLADDER TUMOR N/A 09/24/2020   Procedure: TRANSURETHRAL RESECTION OF BLADDER TUMOR (TURBT)  RIGHT retrograde pylegram;  Surgeon: [name], MD;  Location: [facility];  Service: Urology;  Laterality: N/A;   UPPER GI ENDOSCOPY  03/27/13     Social History:   reports that he quit smoking about 30 years ago. His smoking use included cigarettes. He has a 30.00 pack-year smoking history. He has never used smokeless tobacco. He reports current alcohol use of about 3.0 standard drinks of alcohol per week. He reports that he does not use drugs.   Family History:  His family history includes ADD / ADHD in his daughter; Colon polyps in his father; Diabetes in his brother, brother, brother, mother, and sister; Heart attack in his brother and brother; Heart attack (age of onset: 1) in his father; Heart disease in his brother, brother, and brother; Hyperlipidemia in his mother; Hypertension in his father, maternal grandmother, and mother; Obesity in his brother; Stroke in his father, mother, and sister. There is no history of Stomach cancer, Colon cancer, Esophageal cancer, or Rectal cancer.   Allergies Allergies  Allergen Reactions   Bee Venom Anaphylaxis   Lipitor [Atorvastatin] Other (See  Comments)    Myalgias, memory changes   Morphine Other (See Comments)    hyperactive   Metformin And Related Other (See Comments)    Myalgias and weakness   Lioresal [Baclofen] Other (See Comments)    Acted drunk   Medrol [Methylprednisolone] Other (See Comments)    Hyperglycemia    Zocor [Simvastatin] Other (See Comments)    Joint pain   Hydrocodone Itching   Latex Rash   Pravachol [Pravastatin] Other (See Comments)    Joint pain     Home Medications  Prior to Admission medications   Medication Sig Start Date End  Date Taking? Authorizing Provider  acetaminophen (TYLENOL) 500 MG tablet Take 1,000 mg by mouth every 6 (six) hours as needed for moderate pain or headache.    [provider]  amitriptyline (ELAVIL) 50 MG tablet TAKE 1 TABLET AT BEDTIME 07/03/21   [name], MD  amoxicillin (AMOXIL) 500 MG capsule Take 2,000 mg by mouth See admin instructions.  TAKE 4 CAPSULES BY MOUTH PRIOR TO APPOINTMENT    [provider]  aspirin EC 81 MG tablet Take 1 tablet (81 mg total) by mouth daily. Swallow whole. 11/24/21   Finis Bud, NP  Cholecalciferol (VITAMIN D) 50 MCG (2000 UT) tablet Take 6,000 Units by mouth daily.    [provider]  clopidogrel (PLAVIX) 75 MG tablet Take 1 tablet (75 mg total) by mouth daily with breakfast. 01/20/22   Jerline Pain, MD  Cyanocobalamin (VITAMIN B-12) 1000 MCG SUBL Place 1,000 mcg under the tongue 2 (two) times a week.    [provider]  Dulaglutide (TRULICITY) 1.5 TD/3.2KG SOPN Inject 1.5 mg into the skin every Friday. Trulicity 1.5 UR/4.2 mL subcutaneous pen injector    [provider]  econazole nitrate 1 % cream Apply topically daily. 10/06/21   Debbrah Alar, NP  EPINEPHrine 0.3 mg/0.3 mL IJ SOAJ injection INJECT 0.3 MLS (0.3 MG TOTAL) INTO THE MUSCLE ONCE FOR 1 DOSE 10/06/21   Debbrah Alar, NP  ezetimibe (ZETIA) 10 MG tablet Take 1 tablet (10 mg total) by mouth daily. 01/15/22   Jerline Pain, MD  famotidine (PEPCID) 40 MG tablet TAKE 1 TABLET AT BEDTIME AS NEEDED FOR HEARTBURN OR INDIGESTION 10/09/21   [name], MD  fenofibrate 160 MG tablet Take 1 tablet (160 mg total) by mouth daily. 12/23/21   [name], MD  Ferrous Gluconate-C-Folic Acid (IRON-C PO) Take 1 tablet by mouth daily. Iron 65 mg and vit C-125 mg    [provider]  fluticasone (FLONASE) 50 MCG/ACT nasal spray USE 2 SPRAYS IN EACH NOSTRIL DAILY 12/10/21   [name], MD  gabapentin (NEURONTIN) 300 MG capsule TAKE 1 CAPSULE TWICE A DAY AND 2 CAPSULES AT BEDTIME 10/08/21   [name], MD  glucose blood (FREESTYLE LITE) test strip Use to check blood sugar 4 times day.  Dx Code: E11.9 09/10/20   [name], MD  hydrochlorothiazide (MICROZIDE) 12.5 MG capsule Take 1 capsule (12.5 mg total) by mouth daily. 11/24/21   Finis Bud, NP  insulin lispro (HUMALOG) 100 UNIT/ML KwikPen Inject 2-6 Units into the skin 3 (three) times daily. Per sliding scale. 10/31/20   [name], MD  Insulin Pen Needle (PEN NEEDLES 31GX5/16") 31G X 8 MM MISC Use as directed with Humalog 3 times a day and at bedtime 09/09/20   [name], MD  isosorbide mononitrate (IMDUR) 30 MG 24 hr tablet Take 2 tablets (60 mg total) by mouth daily. 01/26/22   Jerline Pain, MD  losartan (COZAAR) 50 MG tablet TAKE 1 TABLET DAILY (KEEP UPCOMING APPOINTMENT IN MARCH 2023 WITH CARDIOLOGIST BEFORE ANYMORE REFILLS) 09/08/21   Jerline Pain, MD  Magnesium 100 MG CAPS Take 1 capsule (100 mg total) by mouth daily. 06/29/17   [name], MD  metoprolol tartrate (LOPRESSOR) 100 MG tablet Take 1 tablet (100 mg total) by mouth 2 (two) times daily. 08/07/21   Jerline Pain, MD  mometasone (ELOCON) 0.1 % cream Apply 1 Application topically daily. 10/06/21   Debbrah Alar, NP  Multiple Vitamin (MULTIVITAMIN) tablet Take 1 tablet by mouth daily.    [provider]  nitroGLYCERIN (NITROSTAT) 0.4 MG SL tablet Place 1  tablet (0.4 mg total) under the tongue every 5 (five) minutes as needed. 12/29/21   Jerline Pain, MD  oxycodone (OXY-IR) 5 MG capsule Take 5 mg by mouth 3 (three) times daily as needed for pain.    [provider]  pantoprazole (PROTONIX) 40 MG tablet TAKE 1 TABLET TWICE A DAY BEFORE MEALS 01/21/22   [name], MD  Pitavastatin Calcium (LIVALO) 2 MG TABS Take 1 tablet (2 mg total) by mouth daily. 11/24/21   [name], MD  sodium chloride (OCEAN) 0.65 % SOLN nasal spray Place 1 spray into both nostrils as needed for congestion (nose irritation). 07/08/18   Karie Kirks, DO     Critical care time:        Lanier Clam, MD [facility] 02/05/2022 4:33 PM   Please see Amion for contact number. Please page  on-call Pulmonary and Critical Care Team if no response. After 7 pm, contact E-link.

## 2022-02-05 NOTE — Plan of Care (Signed)
  Problem: Clinical Measurements: Goal: Ability to maintain clinical measurements within normal limits will improve Outcome: Progressing Goal: Will remain free from infection Outcome: Progressing Goal: Diagnostic test results will improve Outcome: Progressing Goal: Respiratory complications will improve Outcome: Progressing Goal: Cardiovascular complication will be avoided Outcome: Progressing   Problem: Activity: Goal: Risk for activity intolerance will decrease Outcome: Progressing   Problem: Nutrition: Goal: Adequate nutrition will be maintained Outcome: Progressing   Problem: Coping: Goal: Level of anxiety will decrease Outcome: Progressing   Problem: Elimination: Goal: Will not experience complications related to bowel motility Outcome: Progressing Goal: Will not experience complications related to urinary retention Outcome: Progressing   Problem: Pain Managment: Goal: General experience of comfort will improve Outcome: Progressing   Problem: Safety: Goal: Ability to remain free from injury will improve Outcome: Progressing   Problem: Skin Integrity: Goal: Risk for impaired skin integrity will decrease Outcome: Progressing   Problem: Health Behavior/Discharge Planning: Goal: Ability to manage health-related needs will improve Outcome: Progressing   Problem: Clinical Measurements: Goal: Will remain free from infection Outcome: Progressing   Problem: Elimination: Goal: Will not experience complications related to bowel motility Outcome: Progressing   Problem: Pain Managment: Goal: General experience of comfort will improve Outcome: Progressing   Problem: Safety: Goal: Ability to remain free from injury will improve Outcome: Progressing

## 2022-02-05 NOTE — H&P (View-Only) (Signed)
Attending physician's note   I have taken a history, reviewed the chart, and examined the patient. I performed a substantive portion of this encounter, including complete performance of at least one of the key components, in conjunction with the APP. I agree with the APP's note, impression, and recommendations with my edits.   66 year old male with medical history as outlined below to include history of RYGB c/b GI bleed 2020 from marginal ulcer, presents with melena and symptomatic anemia in the setting of recent PCI in August and DAPT open entheses ASA/Plavix).  Last dose of Plavix was 2 days ago.  Admission hemoglobin 5.7 with good response to 2 unit PRBC transfusion.  Repeat hemoglobin 7.4 today.  FOBT positive.  INR 1.1.  1) Melena 2) Acute blood loss anemia 3) Symptomatic anemia 4) History of Roux-en-Y gastric bypass 5) History of marginal ulcer - Expedited EGD today for diagnostic and therapeutic intent - Continue holding ASA/Plavix - Started on high-dose IV PPI on admission (was already taking Protonix 40 mg po bid as outpatient)  6) CAD with recent PCI - As above, holding DAPT.  Will likely need to restart at least Plavix in short order - Cardiology consult  7) AKI on CKD 2 - Management per primary CCS  The indications, risks, and benefits of EGD were explained to the patient in detail. Risks include but are not limited to bleeding, perforation, adverse reaction to medications, and cardiopulmonary compromise. Sequelae include but are not limited to the possibility of surgery, hospitalization, and mortality. The patient verbalized understanding and wished to proceed.   Gerrit Heck, DO, [place] [phone] office          Consultation  Referring Provider:     Dr. Silas Flood Primary Care Physician:  [name], MD Primary Gastroenterologist: Dr. Henrene Pastor        Reason for Consultation: Melena         HPI:   Brent [name] is a 66 y.o. male with a past  medical history of Roux-en-Y gastric bypass, OSA, CAD status post recent stent on RCA 8/23 DAPT (12/08/2021 echo with normal LVEF 55-60%), history of GI bleed, who presented to the ER in 02/04/2022 with a 2-day history of melena.    Today, the patient, with the help of his wife, explains that he had been feeling well except for recent cardiac stent placed 2 months ago at which time he was started on Plavix and a baby Aspirin, he woke up on 02/03/2022 and had a black stool, he thought possibly related to something he ate but then later that night it recurred again and he woke up the next morning and had another black stool and started to have some dizziness and weakness and presented to the ER.  He had 1 further black tarry stool and has had none yet this morning.  They tend to be quite large.  Tells me this is the same thing that happened when he had a bleeding ulcer years ago.  He has stayed on Pantoprazole 40 mg twice daily since 2020.  Does describe a recent episode of heartburn which is abnormal for him, typically this is well controlled.     Describes last dose of Plavix on Tuesday morning, 02/03/2022.    Denies fever, chills, weight loss, nausea, vomiting or abdominal pain.  ER course: Hemoglobin 5.7 -->2 units--> 7.1--> 7.4  GI history: 09/01/2021 EGD: Showed status post Roux-en-Y gastric bypass and otherwise normal 09/01/2021 colonoscopy: Two 2-3 mm polyps  in the sigmoid colon and cecum, pathology showed adenoma and repeat recommended in 5 years 07/17/2021 office visit with Alonza Bogus, PA-C: That visit discussed hepatomegaly some mild anemia and iron deficiency from time to time at least part and due to absorption issues from his gastric bypass; recommended colonoscopy with Dr. Henrene Pastor, continued on Pantoprazole 40 twice daily and Pepcid for reflux and history of marginal ulcer 07/2016 colonoscopy: 2 mm polyp removed, internal hemorrhoids, pathology showed tubular adenoma and recall placed for 5  years 08/2018 EGD: Follow-up from an endoscopy in March 2020, found to have anatomy consistent with Roux-en-Y gastric bypass and had confirmed a healed marginal ulcer that was seen in March 2020  Past Medical History:  Diagnosis Date   Acid reflux disease    ACID REFLUX DISEASE 07/05/2007   Acute gastric ulcer with bleeding    Alcohol abuse    Anemia    Atherosclerosis    Benign neoplasm of colon 07/05/2007   Bilateral hip pain 10/05/2016   Bladder cancer ([place])    Breast pain, left 11/17/2011   CAD (coronary artery disease)    Chest pain    a. Reportedly negative dobut echo performed prior to gastric bypass in 03/2011;  b. 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 12/2011 Cath: LM nl, LAD 50p, D1 61m LCX min irregs, OM3 30, RCA 25p, 714mFFR 0.99->0.89), PDA 30, EF 65%, Med Rx. d. Cath 11/2021 - 90% m RCA s/p PCI/DES x1   COLONIC POLYPS, HX OF 10/29/2008   Diarrhea 06/13/2010   Diverticulosis 07/05/2018   DIVERTICULOSIS, COLON 10/29/2008   DM (diabetes mellitus), type 2, uncontrolled    GI bleed    Gout 03/04/2013   Hearing loss 05/24/2013   Previous audiology evaluation completely.   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   Neck pain 03/2015   Other and unspecified hyperlipidemia 11/16/2012   Post-operative nausea and vomiting    after the surgery in hospital in March 2020/ past the cauderization   Preventative health care 11/17/2011   Renal stone 11/2013   Sleep apnea    a. CPAP   Spinal stenosis    Tear of meniscus of left knee 2012    Past Surgical History:  Procedure Laterality Date   ANKLE SURGERY Right 1994   BICEPS TENDON REPAIR     left side   CARDIAC CATHETERIZATION     denies any chest pain in the past 2  years   COLONOSCOPY     colonoscopy polyps     CORONARY STENT INTERVENTION N/A 11/28/2021   Procedure: CORONARY STENT INTERVENTION;  Surgeon: JoMartiniquePeter M, MD;  [facility];  Service: Cardiovascular;  Laterality: N/A;   ESOPHAGOGASTRODUODENOSCOPY N/A 03/27/2013   Procedure: ESOPHAGOGASTRODUODENOSCOPY (EGD);  Surgeon: JoIrene ShipperMD;  [facility];  Service: Endoscopy;  Laterality: N/A;   ESOPHAGOGASTRODUODENOSCOPY (EGD) WITH PROPOFOL N/A 07/06/2018   Procedure: ESOPHAGOGASTRODUODENOSCOPY (EGD) WITH PROPOFOL;  Surgeon: PyJerene BearsMD;  [facility]: [facility];  Service: Gastroenterology;  Laterality: N/A;   GASTRIC BYPASS     HERNIA REPAIR     HOT HEMOSTASIS N/A 07/06/2018   Procedure: HOT HEMOSTASIS (ARGON PLASMA COAGULATION/BICAP);  Surgeon: PyJerene BearsMD;  Location: [facility];  Service: Gastroenterology;  Laterality: N/A;   KNEE ARTHROSCOPY Left 11/06/2010   Left, torn meniscus (repaired)   LEFT HEART CATH AND CORONARY ANGIOGRAPHY N/A 11/28/2021   Procedure: LEFT HEART CATH AND CORONARY ANGIOGRAPHY;  Surgeon: Martinique, Peter M, MD;  Location: [facility];  Service: Cardiovascular;  Laterality: N/A;   LEFT HEART CATHETERIZATION WITH CORONARY ANGIOGRAM N/A 12/23/2011   Procedure: LEFT HEART CATHETERIZATION WITH CORONARY ANGIOGRAM;  Surgeon: Minus Breeding, MD;  Location: [facility] C[facility];  Service: Cardiovascular;  Laterality: N/A;   REPLACEMENT TOTAL KNEE Right 01/2016   removed scar tissue/ cut tip of nerve bundle and re-route   right knee arthroscopy Right 07/05/14   Dr. Hart Robinsons, [place].   ROTATOR CUFF REPAIR  2019   left   SCHLEROTHERAPY  07/06/2018   Procedure: SCHLEROTHERAPY;  Surgeon: Jerene Bears, MD;  [facility];  Service: Gastroenterology;;   TONSILLECTOMY  age 33   TONSILLECTOMY     as a child   TOTAL KNEE ARTHROPLASTY Right 01/20/2016   Procedure: RIGHT TOTAL KNEE ARTHROPLASTY;  Surgeon: Paralee Cancel, MD;  [facility];  Service: Orthopedics;  Laterality: Right;   TRANSURETHRAL RESECTION OF BLADDER TUMOR N/A 09/24/2020   Procedure: TRANSURETHRAL RESECTION OF BLADDER TUMOR (TURBT)  RIGHT retrograde pylegram;  Surgeon: [name], MD;  Location: [facility];  Service: Urology;  Laterality: N/A;   UPPER GI ENDOSCOPY  03/27/13    Family History  Problem Relation Age of Onset   Diabetes Mother    Hypertension Mother    Stroke Mother    Hyperlipidemia Mother    Hypertension Father    Colon polyps Father    Heart attack Father 60   Stroke Father    Heart attack Brother    Diabetes Brother    Heart disease Brother    Heart attack Brother        Multiple   Diabetes Brother    Diabetes Sister    Stroke Sister    Obesity Brother    Diabetes Brother    Heart disease Brother    Hypertension Maternal Grandmother    ADD / ADHD Daughter    Heart disease Brother    Stomach cancer Neg Hx    Colon cancer Neg Hx    Esophageal cancer Neg Hx    Rectal cancer Neg Hx     Social History   Tobacco Use   Smoking status: Former    Packs/day: 1.50    Years: 20.00    Total pack years: 30.00    Types: Cigarettes    Quit date: 04/21/1991    Years since quitting: 30.8   Smokeless tobacco: Never  Vaping Use   Vaping Use: Never used  Substance Use Topics   Alcohol use: Yes    Alcohol/week: 3.0 standard drinks of alcohol    Types: 3 Cans of beer per week    Comment: weekly   Drug use: No    Prior to Admission medications   Medication Sig Start Date End Date Taking? Authorizing Provider  acetaminophen (TYLENOL) 500 MG tablet Take 1,000 mg by mouth every 6 (six) hours as needed for moderate pain or headache.   Yes [provider]  amitriptyline (ELAVIL) 50 MG tablet TAKE 1 TABLET AT BEDTIME Patient taking differently: Take 50 mg by mouth at bedtime. 07/03/21  Yes [name], MD  aspirin EC 81 MG tablet Take 1 tablet (81 mg total) by mouth daily. Swallow  whole. 11/24/21  Yes Finis Bud, NP   Cholecalciferol (VITAMIN D) 50 MCG (2000 UT) tablet Take 6,000 Units by mouth daily.   Yes [provider]  clopidogrel (PLAVIX) 75 MG tablet Take 1 tablet (75 mg total) by mouth daily with breakfast. 01/20/22  Yes Jerline Pain, MD  Cyanocobalamin (VITAMIN B-12) 1000 MCG SUBL Place 1,000 mcg under the tongue 2 (two) times a week.   Yes [provider]  Dulaglutide (TRULICITY) 1.5 WC/5.8NI SOPN Inject 3 mg into the skin once a week. Saturday   Yes [provider]  ezetimibe (ZETIA) 10 MG tablet Take 1 tablet (10 mg total) by mouth daily. 01/15/22  Yes Jerline Pain, MD  famotidine (PEPCID) 40 MG tablet TAKE 1 TABLET AT BEDTIME AS NEEDED FOR HEARTBURN OR INDIGESTION Patient taking differently: Take 40 mg by mouth at bedtime as needed for heartburn or indigestion. 10/09/21  Yes [name], MD  fenofibrate 160 MG tablet Take 1 tablet (160 mg total) by mouth daily. 12/23/21  Yes [name], MD  Ferrous Gluconate-C-Folic Acid (IRON-C PO) Take 1 tablet by mouth daily. Iron 65 mg and vit C-125 mg   Yes [provider]  fluticasone (FLONASE) 50 MCG/ACT nasal spray USE 2 SPRAYS IN EACH NOSTRIL DAILY 12/10/21  Yes [name], MD  gabapentin (NEURONTIN) 300 MG capsule TAKE 1 CAPSULE TWICE A DAY AND 2 CAPSULES AT BEDTIME Patient taking differently: Take 300-900 mg by mouth See admin instructions. Take 1 capsule (300 mg) by mouth in the morning and at noon then take 3 capsules (900 mg) in the afternoon and at bedtime 10/08/21  Yes [name], MD  hydrochlorothiazide (MICROZIDE) 12.5 MG capsule Take 1 capsule (12.5 mg total) by mouth daily. 11/24/21  Yes Finis Bud, NP  isosorbide mononitrate (IMDUR) 30 MG 24 hr tablet Take 2 tablets (60 mg total) by mouth daily. 01/26/22  Yes Jerline Pain, MD  losartan (COZAAR) 50 MG tablet TAKE 1 TABLET DAILY (KEEP UPCOMING APPOINTMENT IN MARCH 2023 WITH CARDIOLOGIST BEFORE ANYMORE REFILLS) Patient taking differently: Take  50 mg by mouth daily. 09/08/21  Yes Jerline Pain, MD  Magnesium 100 MG CAPS Take 1 capsule (100 mg total) by mouth daily. 06/29/17  Yes [name], MD  metoprolol tartrate (LOPRESSOR) 100 MG tablet Take 1 tablet (100 mg total) by mouth 2 (two) times daily. 08/07/21  Yes Jerline Pain, MD  Multiple Vitamin (MULTIVITAMIN) tablet Take 1 tablet by mouth daily.   Yes [provider]  nitroGLYCERIN (NITROSTAT) 0.4 MG SL tablet Place 1 tablet (0.4 mg total) under the tongue every 5 (five) minutes as needed. 12/29/21  Yes Jerline Pain, MD  oxycodone (OXY-IR) 5 MG capsule Take 5 mg by mouth 3 (three) times daily as needed for pain.   Yes [provider]  pantoprazole (PROTONIX) 40 MG tablet TAKE 1 TABLET TWICE A DAY BEFORE MEALS Patient taking differently: Take 40 mg by mouth 2 (two) times daily before a meal. 01/21/22  Yes [name], MD  Pitavastatin Calcium (LIVALO) 2 MG TABS Take 1 tablet (2 mg total) by mouth daily. 11/24/21  Yes [name], MD  amoxicillin (AMOXIL) 500 MG capsule Take 2,000 mg by mouth See admin instructions. Take 4 capsules (2,000 mg) prior to dental appointments    [provider]  econazole nitrate 1 % cream Apply topically daily. Patient not taking: Reported on 02/05/2022 10/06/21   Debbrah Alar, NP  EPINEPHrine 0.3 mg/0.3 mL IJ SOAJ injection INJECT 0.3 MLS (0.3 MG TOTAL) INTO THE MUSCLE ONCE FOR 1 DOSE Patient taking differently: Inject 0.3 mg into the muscle as needed for anaphylaxis. 10/06/21   Debbrah Alar, NP  mometasone (ELOCON) 0.1 % cream Apply 1 Application topically daily. Patient taking differently: Apply 1 Application topically as needed (Poison ivy). 10/06/21   Debbrah Alar, NP    Current Facility-Administered Medications  Medication Dose Route Frequency Provider Last Rate Last Admin   Chlorhexidine Gluconate Cloth 2 % PADS 6 each  6 each Topical Daily Margaretha Seeds, MD   6 each at 02/04/22 2107    docusate sodium (COLACE) capsule 100 mg  100 mg Oral BID PRN Margaretha Seeds, MD       lactated ringers infusion   Intravenous Continuous Hunsucker, Bonna Gains, MD 75 mL/hr at 02/05/22 0735 Infusion Verify at 02/05/22 0735   ondansetron (ZOFRAN) injection 4 mg  4 mg Intravenous Q6H PRN Margaretha Seeds, MD       Oral care mouth rinse  15 mL Mouth Rinse PRN Margaretha Seeds, MD       [START ON 02/08/2022] pantoprazole (PROTONIX) injection 40 mg  40 mg Intravenous Q12H Elgie Congo, MD       pantoprozole (PROTONIX) 80 mg /NS 100 mL infusion  8 mg/hr Intravenous Continuous Elgie Congo, MD 10 mL/hr at 02/05/22 0654 8 mg/hr at 02/05/22 0654   polyethylene glycol (MIRALAX / GLYCOLAX) packet 17 g  17 g Oral Daily PRN Margaretha Seeds, MD        Allergies as of 02/04/2022 - Review Complete 02/04/2022  Allergen Reaction Noted   Bee venom Anaphylaxis 09/10/2010   Lipitor [atorvastatin] Other (See Comments) 01/12/2012   Morphine Other (See Comments) 07/14/2018   Metformin and related Other (See Comments) 10/01/2020   Robaxin [methocarbamol]  05/29/2021   Baclofen  09/23/2020   Medrol [methylprednisolone]  09/23/2020   Simvastatin  09/23/2020   Hydrocodone Itching 07/13/2016   Pravastatin Other (See Comments) 12/15/2015     Review of Systems:    Constitutional: No weight loss, fever or chills Skin: No rash  Cardiovascular: No chest pain  Respiratory: No SOB  Gastrointestinal: See HPI and otherwise negative Genitourinary: No dysuria  Neurological: No headache, dizziness or syncope Musculoskeletal: No new muscle or joint pain Hematologic: No bruising Psychiatric: No history of depression or anxiety    Physical Exam:  Vital signs in last 24 hours: Temp:  [97.9 F (36.6 C)-99 F (37.2 C)] 98.8 F (37.1 C) (10/19 0659) Pulse Rate:  [70-97] 85 (10/19 0717) Resp:  [2-21] 14 (10/19 0717) BP: (81-122)/(45-67) 105/53 (10/19 0717) SpO2:  [94 %-100 %] 97 % (10/19  0717) Weight:  [112.5 kg] 112.5 kg (10/19 0500) Last BM Date :  (PTA) General:   Pleasant Caucasian male appears to be in NAD, Well developed, Well nourished, alert and cooperative Head:  Normocephalic and atraumatic. Eyes:   PEERL, EOMI. No icterus. Conjunctiva pink. Ears:  Normal auditory acuity. Neck:  Supple Throat: Oral cavity and pharynx without inflammation, swelling or lesion.  Lungs: Respirations even and unlabored. Lungs clear to auscultation bilaterally.   No wheezes, crackles, or rhonchi.  Heart: Normal S1, S2. No MRG. Regular rate and rhythm. No peripheral edema, cyanosis or pallor.  Abdomen:  Soft, nondistended, nontender. No rebound or guarding. Normal bowel sounds. No appreciable masses or hepatomegaly. Rectal:  Not performed.  Msk:  Symmetrical without gross deformities. Peripheral pulses intact.  Extremities:  Without edema, no deformity or joint abnormality.  Neurologic:  Alert and  oriented x4;  grossly normal neurologically.  Skin:   Dry and intact without significant lesions or rashes. Psychiatric: Demonstrates good judgement and reason without abnormal affect or behaviors.   LAB RESULTS: Recent Labs    02/04/22 1846 02/05/22 0502 02/05/22 0607  WBC 6.9 3.7* 3.7*  HGB 5.7* 7.1* 7.4*  HCT 17.3* 22.1* 22.2*  PLT 195 159 150   BMET Recent Labs    02/04/22 1846 02/05/22 0502  NA 138 140  K 4.4 4.0  CL 106 110  CO2 24 24  GLUCOSE 137* 137*  BUN 41* 31*  CREATININE 1.61* 1.26*  CALCIUM 8.6* 8.4*   LFT Recent Labs    02/04/22 1846  PROT 5.4*  ALBUMIN 3.9  AST 14*  ALT 11  ALKPHOS 21*  BILITOT 0.3   PT/INR Recent Labs    02/04/22 1815  LABPROT 14.5  INR 1.1    Impression / Plan:   Impression: 1.  Acute blood loss anemia: Started with melena twice daily on 02/03/2022 which has continued since then, hemoglobin 5.7--> 2 units PRBCs--> 7.1--> 7.4, previous history of bleeding marginal ulcer in 2020, maintained on Pantoprazole 40 twice daily,  recently had stent placed and put on Plavix and Aspirin; likely marginal ulcer exacerbated by blood thinners/aspirin 2.  CAD status post recent stent to RCA 11/2021: DAPT on hold-last dose of Plavix 02/04/2019 3 AM 3.  AKI/CKD stage II 4.  GERD: He of marginal ulcer in 2020, maintained on Pantoprazole 40 twice daily  Plan: 1.  Patient would benefit from EGD for further evaluation of melena.  Did review risks, benefits, limitations and alternatives and the patient agrees to proceed.  Patient scheduled for this afternoon around 130 for EGD. 2.  Continue Pantoprazole 40 mg IV twice daily and infusion for now 3.  Patient to remain n.p.o. except for sips with meds until after time of procedure 4.  Continue to monitor hemoglobin with transfusion as needed less than 7 5.  Continue to hold Plavix and Aspirin for now 6.  Please await further recommendations from Dr. Bryan Lemma after time of EGD this afternoon.  Thank you for your kind consultation, we will continue to follow.  Lavone Nian [facility]  02/05/2022, 8:39 AM

## 2022-02-05 NOTE — Consult Note (Addendum)
Attending physician's note   I have taken a history, reviewed the chart, and examined the patient. I performed a substantive portion of this encounter, including complete performance of at least one of the key components, in conjunction with the APP. I agree with the APP's note, impression, and recommendations with my edits.   66 year old male with medical history as outlined below to include history of RYGB c/b GI bleed 2020 from marginal ulcer, presents with melena and symptomatic anemia in the setting of recent PCI in August and DAPT open entheses ASA/Plavix).  Last dose of Plavix was 2 days ago.  Admission hemoglobin 5.7 with good response to 2 unit PRBC transfusion.  Repeat hemoglobin 7.4 today.  FOBT positive.  INR 1.1.  1) Melena 2) Acute blood loss anemia 3) Symptomatic anemia 4) History of Roux-en-Y gastric bypass 5) History of marginal ulcer - Expedited EGD today for diagnostic and therapeutic intent - Continue holding ASA/Plavix - Started on high-dose IV PPI on admission (was already taking Protonix 40 mg po bid as outpatient)  6) CAD with recent PCI - As above, holding DAPT.  Will likely need to restart at least Plavix in short order - Cardiology consult  7) AKI on CKD 2 - Management per primary CCS  The indications, risks, and benefits of EGD were explained to the patient in detail. Risks include but are not limited to bleeding, perforation, adverse reaction to medications, and cardiopulmonary compromise. Sequelae include but are not limited to the possibility of surgery, hospitalization, and mortality. The patient verbalized understanding and wished to proceed.   Gerrit Heck, DO, [facility] [phone] office          Consultation  Referring Provider:     Dr. Silas Flood Primary Care Physician:  [name], MD Primary Gastroenterologist: Dr. Henrene Pastor        Reason for Consultation: Melena         HPI:   Brent [name] is a 66 y.o. male with a past  medical history of Roux-en-Y gastric bypass, OSA, CAD status post recent stent on RCA 8/23 DAPT (12/08/2021 echo with normal LVEF 55-60%), history of GI bleed, who presented to the ER in 02/04/2022 with a 2-day history of melena.    Today, the patient, with the help of his wife, explains that he had been feeling well except for recent cardiac stent placed 2 months ago at which time he was started on Plavix and a baby Aspirin, he woke up on 02/03/2022 and had a black stool, he thought possibly related to something he ate but then later that night it recurred again and he woke up the next morning and had another black stool and started to have some dizziness and weakness and presented to the ER.  He had 1 further black tarry stool and has had none yet this morning.  They tend to be quite large.  Tells me this is the same thing that happened when he had a bleeding ulcer years ago.  He has stayed on Pantoprazole 40 mg twice daily since 2020.  Does describe a recent episode of heartburn which is abnormal for him, typically this is well controlled.     Describes last dose of Plavix on Tuesday morning, 02/03/2022.    Denies fever, chills, weight loss, nausea, vomiting or abdominal pain.  ER course: Hemoglobin 5.7 -->2 units--> 7.1--> 7.4  GI history: 09/01/2021 EGD: Showed status post Roux-en-Y gastric bypass and otherwise normal 09/01/2021 colonoscopy: Two 2-3 mm polyps  in the sigmoid colon and cecum, pathology showed adenoma and repeat recommended in 5 years 07/17/2021 office visit with Alonza Bogus, PA-C: That visit discussed hepatomegaly some mild anemia and iron deficiency from time to time at least part and due to absorption issues from his gastric bypass; recommended colonoscopy with Dr. Henrene Pastor, continued on Pantoprazole 40 twice daily and Pepcid for reflux and history of marginal ulcer 07/2016 colonoscopy: 2 mm polyp removed, internal hemorrhoids, pathology showed tubular adenoma and recall placed for 5  years 08/2018 EGD: Follow-up from an endoscopy in March 2020, found to have anatomy consistent with Roux-en-Y gastric bypass and had confirmed a healed marginal ulcer that was seen in March 2020  Past Medical History:  Diagnosis Date   Acid reflux disease    ACID REFLUX DISEASE 07/05/2007   Acute gastric ulcer with bleeding    Alcohol abuse    Anemia    Atherosclerosis    Benign neoplasm of colon 07/05/2007   Bilateral hip pain 10/05/2016   Bladder cancer ([place])    Breast pain, left 11/17/2011   CAD (coronary artery disease)    Chest pain    a. Reportedly negative dobut echo performed prior to gastric bypass in 03/2011;  b. 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 12/2011 Cath: LM nl, LAD 50p, D1 18m LCX min irregs, OM3 30, RCA 25p, 744mFFR 0.99->0.89), PDA 30, EF 65%, Med Rx. d. Cath 11/2021 - 90% m RCA s/p PCI/DES x1   COLONIC POLYPS, HX OF 10/29/2008   Diarrhea 06/13/2010   Diverticulosis 07/05/2018   DIVERTICULOSIS, COLON 10/29/2008   DM (diabetes mellitus), type 2, uncontrolled    GI bleed    Gout 03/04/2013   Hearing loss 05/24/2013   Previous audiology evaluation completely.   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   Neck pain 03/2015   Other and unspecified hyperlipidemia 11/16/2012   Post-operative nausea and vomiting    after the surgery in hospital in March 2020/ past the cauderization   Preventative health care 11/17/2011   Renal stone 11/2013   Sleep apnea    a. CPAP   Spinal stenosis    Tear of meniscus of left knee 2012    Past Surgical History:  Procedure Laterality Date   ANKLE SURGERY Right 1994   BICEPS TENDON REPAIR     left side   CARDIAC CATHETERIZATION     denies any chest pain in the past 2  years   COLONOSCOPY     colonoscopy polyps     CORONARY STENT INTERVENTION N/A 11/28/2021   Procedure: CORONARY STENT INTERVENTION;  Surgeon: JoMartiniquePeter M, MD;  [facility];  Service: Cardiovascular;  Laterality: N/A;   ESOPHAGOGASTRODUODENOSCOPY N/A 03/27/2013   Procedure: ESOPHAGOGASTRODUODENOSCOPY (EGD);  Surgeon: JoIrene ShipperMD;  [facility];  Service: Endoscopy;  Laterality: N/A;   ESOPHAGOGASTRODUODENOSCOPY (EGD) WITH PROPOFOL N/A 07/06/2018   Procedure: ESOPHAGOGASTRODUODENOSCOPY (EGD) WITH PROPOFOL;  Surgeon: PyJerene BearsMD;  [facility];  Service: Gastroenterology;  Laterality: N/A;   GASTRIC BYPASS     HERNIA REPAIR     HOT HEMOSTASIS N/A 07/06/2018   Procedure: HOT HEMOSTASIS (ARGON PLASMA COAGULATION/BICAP);  Surgeon: PyJerene BearsMD;  [facility];  Service: Gastroenterology;  Laterality: N/A;   KNEE ARTHROSCOPY Left 11/06/2010   Left, torn meniscus (repaired)   LEFT HEART CATH AND CORONARY ANGIOGRAPHY N/A 11/28/2021   Procedure: LEFT HEART CATH AND CORONARY ANGIOGRAPHY;  Surgeon: Martinique, Peter M, MD;  Location: [facility];  Service: Cardiovascular;  Laterality: N/A;   LEFT HEART CATHETERIZATION WITH CORONARY ANGIOGRAM N/A 12/23/2011   Procedure: LEFT HEART CATHETERIZATION WITH CORONARY ANGIOGRAM;  Surgeon: Minus Breeding, MD;  Location: [facility] C[facility];  Service: Cardiovascular;  Laterality: N/A;   REPLACEMENT TOTAL KNEE Right 01/2016   removed scar tissue/ cut tip of nerve bundle and re-route   right knee arthroscopy Right 07/05/14   Dr. Hart Robinsons, [place].   ROTATOR CUFF REPAIR  2019   left   SCHLEROTHERAPY  07/06/2018   Procedure: SCHLEROTHERAPY;  Surgeon: Jerene Bears, MD;  [facility];  Service: Gastroenterology;;   TONSILLECTOMY  age 65   TONSILLECTOMY     as a child   TOTAL KNEE ARTHROPLASTY Right 01/20/2016   Procedure: RIGHT TOTAL KNEE ARTHROPLASTY;  Surgeon: Paralee Cancel, MD;  [facility];  Service: Orthopedics;  Laterality: Right;   TRANSURETHRAL RESECTION OF BLADDER TUMOR N/A 09/24/2020   Procedure: TRANSURETHRAL RESECTION OF BLADDER TUMOR (TURBT)  RIGHT retrograde pylegram;  Surgeon: [name], MD;  [facility];  Service: Urology;  Laterality: N/A;   UPPER GI ENDOSCOPY  03/27/13    Family History  Problem Relation Age of Onset   Diabetes Mother    Hypertension Mother    Stroke Mother    Hyperlipidemia Mother    Hypertension Father    Colon polyps Father    Heart attack Father 28   Stroke Father    Heart attack Brother    Diabetes Brother    Heart disease Brother    Heart attack Brother        Multiple   Diabetes Brother    Diabetes Sister    Stroke Sister    Obesity Brother    Diabetes Brother    Heart disease Brother    Hypertension Maternal Grandmother    ADD / ADHD Daughter    Heart disease Brother    Stomach cancer Neg Hx    Colon cancer Neg Hx    Esophageal cancer Neg Hx    Rectal cancer Neg Hx     Social History   Tobacco Use   Smoking status: Former    Packs/day: 1.50    Years: 20.00    Total pack years: 30.00    Types: Cigarettes    Quit date: 04/21/1991    Years since quitting: 30.8   Smokeless tobacco: Never  Vaping Use   Vaping Use: Never used  Substance Use Topics   Alcohol use: Yes    Alcohol/week: 3.0 standard drinks of alcohol    Types: 3 Cans of beer per week    Comment: weekly   Drug use: No    Prior to Admission medications   Medication Sig Start Date End Date Taking? Authorizing Provider  acetaminophen (TYLENOL) 500 MG tablet Take 1,000 mg by mouth every 6 (six) hours as needed for moderate pain or headache.   Yes [provider]  amitriptyline (ELAVIL) 50 MG tablet TAKE 1 TABLET AT BEDTIME Patient taking differently: Take 50 mg by mouth at bedtime. 07/03/21  Yes [name], MD  aspirin EC 81 MG tablet Take 1 tablet (81 mg total) by mouth daily. Swallow  whole. 11/24/21  Yes Finis Bud, NP   Cholecalciferol (VITAMIN D) 50 MCG (2000 UT) tablet Take 6,000 Units by mouth daily.   Yes [provider]  clopidogrel (PLAVIX) 75 MG tablet Take 1 tablet (75 mg total) by mouth daily with breakfast. 01/20/22  Yes Jerline Pain, MD  Cyanocobalamin (VITAMIN B-12) 1000 MCG SUBL Place 1,000 mcg under the tongue 2 (two) times a week.   Yes [provider]  Dulaglutide (TRULICITY) 1.5 RC/7.8LF SOPN Inject 3 mg into the skin once a week. Saturday   Yes [provider]  ezetimibe (ZETIA) 10 MG tablet Take 1 tablet (10 mg total) by mouth daily. 01/15/22  Yes Jerline Pain, MD  famotidine (PEPCID) 40 MG tablet TAKE 1 TABLET AT BEDTIME AS NEEDED FOR HEARTBURN OR INDIGESTION Patient taking differently: Take 40 mg by mouth at bedtime as needed for heartburn or indigestion. 10/09/21  Yes [name], MD  fenofibrate 160 MG tablet Take 1 tablet (160 mg total) by mouth daily. 12/23/21  Yes [name], MD  Ferrous Gluconate-C-Folic Acid (IRON-C PO) Take 1 tablet by mouth daily. Iron 65 mg and vit C-125 mg   Yes [provider]  fluticasone (FLONASE) 50 MCG/ACT nasal spray USE 2 SPRAYS IN EACH NOSTRIL DAILY 12/10/21  Yes [name], MD  gabapentin (NEURONTIN) 300 MG capsule TAKE 1 CAPSULE TWICE A DAY AND 2 CAPSULES AT BEDTIME Patient taking differently: Take 300-900 mg by mouth See admin instructions. Take 1 capsule (300 mg) by mouth in the morning and at noon then take 3 capsules (900 mg) in the afternoon and at bedtime 10/08/21  Yes [name], MD  hydrochlorothiazide (MICROZIDE) 12.5 MG capsule Take 1 capsule (12.5 mg total) by mouth daily. 11/24/21  Yes Finis Bud, NP  isosorbide mononitrate (IMDUR) 30 MG 24 hr tablet Take 2 tablets (60 mg total) by mouth daily. 01/26/22  Yes Jerline Pain, MD  losartan (COZAAR) 50 MG tablet TAKE 1 TABLET DAILY (KEEP UPCOMING APPOINTMENT IN MARCH 2023 WITH CARDIOLOGIST BEFORE ANYMORE REFILLS) Patient taking differently: Take  50 mg by mouth daily. 09/08/21  Yes Jerline Pain, MD  Magnesium 100 MG CAPS Take 1 capsule (100 mg total) by mouth daily. 06/29/17  Yes [name], MD  metoprolol tartrate (LOPRESSOR) 100 MG tablet Take 1 tablet (100 mg total) by mouth 2 (two) times daily. 08/07/21  Yes Jerline Pain, MD  Multiple Vitamin (MULTIVITAMIN) tablet Take 1 tablet by mouth daily.   Yes [provider]  nitroGLYCERIN (NITROSTAT) 0.4 MG SL tablet Place 1 tablet (0.4 mg total) under the tongue every 5 (five) minutes as needed. 12/29/21  Yes Jerline Pain, MD  oxycodone (OXY-IR) 5 MG capsule Take 5 mg by mouth 3 (three) times daily as needed for pain.   Yes [provider]  pantoprazole (PROTONIX) 40 MG tablet TAKE 1 TABLET TWICE A DAY BEFORE MEALS Patient taking differently: Take 40 mg by mouth 2 (two) times daily before a meal. 01/21/22  Yes [name], MD  Pitavastatin Calcium (LIVALO) 2 MG TABS Take 1 tablet (2 mg total) by mouth daily. 11/24/21  Yes [name], MD  amoxicillin (AMOXIL) 500 MG capsule Take 2,000 mg by mouth See admin instructions. Take 4 capsules (2,000 mg) prior to dental appointments    [provider]  econazole nitrate 1 % cream Apply topically daily. Patient not taking: Reported on 02/05/2022 10/06/21   Debbrah Alar, NP  EPINEPHrine 0.3 mg/0.3 mL IJ SOAJ injection INJECT 0.3 MLS (0.3 MG TOTAL) INTO THE MUSCLE ONCE FOR 1 DOSE Patient taking differently: Inject 0.3 mg into the muscle as needed for anaphylaxis. 10/06/21   Debbrah Alar, NP  mometasone (ELOCON) 0.1 % cream Apply 1 Application topically daily. Patient taking differently: Apply 1 Application topically as needed (Poison ivy). 10/06/21   Debbrah Alar, NP    Current Facility-Administered Medications  Medication Dose Route Frequency Provider Last Rate Last Admin   Chlorhexidine Gluconate Cloth 2 % PADS 6 each  6 each Topical Daily Margaretha Seeds, MD   6 each at 02/04/22 2107    docusate sodium (COLACE) capsule 100 mg  100 mg Oral BID PRN Margaretha Seeds, MD       lactated ringers infusion   Intravenous Continuous Hunsucker, Bonna Gains, MD 75 mL/hr at 02/05/22 0735 Infusion Verify at 02/05/22 0735   ondansetron (ZOFRAN) injection 4 mg  4 mg Intravenous Q6H PRN Margaretha Seeds, MD       Oral care mouth rinse  15 mL Mouth Rinse PRN Margaretha Seeds, MD       [START ON 02/08/2022] pantoprazole (PROTONIX) injection 40 mg  40 mg Intravenous Q12H Elgie Congo, MD       pantoprozole (PROTONIX) 80 mg /NS 100 mL infusion  8 mg/hr Intravenous Continuous Elgie Congo, MD 10 mL/hr at 02/05/22 0654 8 mg/hr at 02/05/22 0654   polyethylene glycol (MIRALAX / GLYCOLAX) packet 17 g  17 g Oral Daily PRN Margaretha Seeds, MD        Allergies as of 02/04/2022 - Review Complete 02/04/2022  Allergen Reaction Noted   Bee venom Anaphylaxis 09/10/2010   Lipitor [atorvastatin] Other (See Comments) 01/12/2012   Morphine Other (See Comments) 07/14/2018   Metformin and related Other (See Comments) 10/01/2020   Robaxin [methocarbamol]  05/29/2021   Baclofen  09/23/2020   Medrol [methylprednisolone]  09/23/2020   Simvastatin  09/23/2020   Hydrocodone Itching 07/13/2016   Pravastatin Other (See Comments) 12/15/2015     Review of Systems:    Constitutional: No weight loss, fever or chills Skin: No rash  Cardiovascular: No chest pain  Respiratory: No SOB  Gastrointestinal: See HPI and otherwise negative Genitourinary: No dysuria  Neurological: No headache, dizziness or syncope Musculoskeletal: No new muscle or joint pain Hematologic: No bruising Psychiatric: No history of depression or anxiety    Physical Exam:  Vital signs in last 24 hours: Temp:  [97.9 F (36.6 C)-99 F (37.2 C)] 98.8 F (37.1 C) (10/19 0659) Pulse Rate:  [70-97] 85 (10/19 0717) Resp:  [2-21] 14 (10/19 0717) BP: (81-122)/(45-67) 105/53 (10/19 0717) SpO2:  [94 %-100 %] 97 % (10/19  0717) Weight:  [112.5 kg] 112.5 kg (10/19 0500) Last BM Date :  (PTA) General:   Pleasant Caucasian male appears to be in NAD, Well developed, Well nourished, alert and cooperative Head:  Normocephalic and atraumatic. Eyes:   PEERL, EOMI. No icterus. Conjunctiva pink. Ears:  Normal auditory acuity. Neck:  Supple Throat: Oral cavity and pharynx without inflammation, swelling or lesion.  Lungs: Respirations even and unlabored. Lungs clear to auscultation bilaterally.   No wheezes, crackles, or rhonchi.  Heart: Normal S1, S2. No MRG. Regular rate and rhythm. No peripheral edema, cyanosis or pallor.  Abdomen:  Soft, nondistended, nontender. No rebound or guarding. Normal bowel sounds. No appreciable masses or hepatomegaly. Rectal:  Not performed.  Msk:  Symmetrical without gross deformities. Peripheral pulses intact.  Extremities:  Without edema, no deformity or joint abnormality.  Neurologic:  Alert and  oriented x4;  grossly normal neurologically.  Skin:   Dry and intact without significant lesions or rashes. Psychiatric: Demonstrates good judgement and reason without abnormal affect or behaviors.   LAB RESULTS: Recent Labs    02/04/22 1846 02/05/22 0502 02/05/22 0607  WBC 6.9 3.7* 3.7*  HGB 5.7* 7.1* 7.4*  HCT 17.3* 22.1* 22.2*  PLT 195 159 150   BMET Recent Labs    02/04/22 1846 02/05/22 0502  NA 138 140  K 4.4 4.0  CL 106 110  CO2 24 24  GLUCOSE 137* 137*  BUN 41* 31*  CREATININE 1.61* 1.26*  CALCIUM 8.6* 8.4*   LFT Recent Labs    02/04/22 1846  PROT 5.4*  ALBUMIN 3.9  AST 14*  ALT 11  ALKPHOS 21*  BILITOT 0.3   PT/INR Recent Labs    02/04/22 1815  LABPROT 14.5  INR 1.1    Impression / Plan:   Impression: 1.  Acute blood loss anemia: Started with melena twice daily on 02/03/2022 which has continued since then, hemoglobin 5.7--> 2 units PRBCs--> 7.1--> 7.4, previous history of bleeding marginal ulcer in 2020, maintained on Pantoprazole 40 twice daily,  recently had stent placed and put on Plavix and Aspirin; likely marginal ulcer exacerbated by blood thinners/aspirin 2.  CAD status post recent stent to RCA 11/2021: DAPT on hold-last dose of Plavix 02/04/2019 3 AM 3.  AKI/CKD stage II 4.  GERD: He of marginal ulcer in 2020, maintained on Pantoprazole 40 twice daily  Plan: 1.  Patient would benefit from EGD for further evaluation of melena.  Did review risks, benefits, limitations and alternatives and the patient agrees to proceed.  Patient scheduled for this afternoon around 130 for EGD. 2.  Continue Pantoprazole 40 mg IV twice daily and infusion for now 3.  Patient to remain n.p.o. except for sips with meds until after time of procedure 4.  Continue to monitor hemoglobin with transfusion as needed less than 7 5.  Continue to hold Plavix and Aspirin for now 6.  Please await further recommendations from Dr. Bryan Lemma after time of EGD this afternoon.  Thank you for your kind consultation, we will continue to follow.  Lavone Nian [facility]  02/05/2022, 8:39 AM

## 2022-02-05 NOTE — Interval H&P Note (Signed)
History and Physical Interval Note:  02/05/2022 1:13 PM  Brent [name]  has presented today for surgery, with the diagnosis of Melena.  The various methods of treatment have been discussed with the patient and family. After consideration of risks, benefits and other options for treatment, the patient has consented to  Procedure(s): ESOPHAGOGASTRODUODENOSCOPY (EGD) WITH PROPOFOL (N/A) as a surgical intervention.  The patient's history has been reviewed, patient examined, no change in status, stable for surgery.  I have reviewed the patient's chart and labs.  Questions were answered to the patient's satisfaction.     Dominic Pea [name]

## 2022-02-05 NOTE — Anesthesia Preprocedure Evaluation (Addendum)
Anesthesia Evaluation  Patient identified by MRN, date of birth, ID band Patient awake    Reviewed: Allergy & Precautions, NPO status , Patient's Chart, lab work & pertinent test results  History of Anesthesia Complications (+) PONV and history of anesthetic complications  Airway Mallampati: IV  TM Distance: >3 FB Neck ROM: Full    Dental  (+) Teeth Intact, Dental Advisory Given   Pulmonary sleep apnea , former smoker,    breath sounds clear to auscultation       Cardiovascular hypertension, Pt. on medications and Pt. on home beta blockers + CAD and + DOE   Rhythm:Regular  1. Left ventricular ejection fraction, by estimation, is 55 to 60%. The  left ventricle has normal function. The left ventricle has no regional  wall motion abnormalities. Left ventricular diastolic parameters are  indeterminate.  2. Right ventricular systolic function is normal. The right ventricular  size is normal. There is normal pulmonary artery systolic pressure. The  estimated right ventricular systolic pressure is 20.9 mmHg.  3. The mitral valve is grossly normal. Mild mitral valve regurgitation.  No evidence of mitral stenosis.  4. The aortic valve is tricuspid. There is mild calcification of the  aortic valve. Aortic valve regurgitation is mild. Aortic valve sclerosis  is present, with no evidence of aortic valve stenosis.  5. The inferior vena cava is normal in size with greater than 50%  respiratory variability, suggesting right atrial pressure of 3 mmHg.    .  Mid LAD lesion is 40% stenosed. .  1st Diag lesion is 40% stenosed. .  Mid Cx to Dist Cx lesion is 40% stenosed. .  Prox RCA to Mid RCA lesion is 90% stenosed. .  A drug-eluting stent was successfully placed using a SYNERGY XD [id number]. [name]  Post intervention, there is a 0% residual stenosis. .  The left ventricular systolic function is normal. .  LV end diastolic pressure is normal. .   The left ventricular ejection fraction is 55-65% by visual estimate.  1. Single vessel obstructive CAD involving the mid RCA 2. Normal LV function 3. Normal LVEDP 4. Successful PCI of the mid RCA with DES x 1    Neuro/Psych  Headaches,  Neuromuscular disease    GI/Hepatic Neg liver ROS, PUD, GERD  Medicated,? GI bleed   Endo/Other  diabetes  Renal/GU Renal InsufficiencyRenal diseaseLab Results      Component                Value               Date                      CREATININE               1.26 (H)            02/05/2022                Musculoskeletal   Abdominal   Peds  Hematology  (+) Blood dyscrasia, anemia , Lab Results      Component                Value               Date                      WBC  3.7 (L)             02/05/2022                HGB                      7.4 (L)             02/05/2022                HCT                      22.2 (L)            02/05/2022                MCV                      90.6                02/05/2022                PLT                      150                 02/05/2022               Anesthesia Other Findings   Reproductive/Obstetrics                            Anesthesia Physical Anesthesia Plan  ASA: 4  Anesthesia Plan: MAC   Post-op Pain Management: Minimal or no pain anticipated   Induction: Intravenous  PONV Risk Score and Plan: 2 and Propofol infusion and Treatment may vary due to age or medical condition  Airway Management Planned: Nasal Cannula and Natural Airway  Additional Equipment: None  Intra-op Plan:   Post-operative Plan:   Informed Consent: I have reviewed the patients History and Physical, chart, labs and discussed the procedure including the risks, benefits and alternatives for the proposed anesthesia with the patient or authorized representative who has indicated his/her understanding and acceptance.     Dental advisory given  Plan Discussed  with: CRNA  Anesthesia Plan Comments:         Anesthesia Quick Evaluation

## 2022-02-05 NOTE — Progress Notes (Addendum)
[facility] ADULT ICU REPLACEMENT PROTOCOL   The patient does apply for the [facility] Adult ICU Electrolyte Replacment Protocol based on the criteria listed below:   1.Exclusion criteria: TCTS patients, ECMO patients, and Dialysis patients 2. Is GFR >/= 30 ml/min? Yes.    Patient's GFR today is >60 3. Is SCr </= 2? Yes.   Patient's SCr is 1.26 mg/dL 4. Did SCr increase >/= 0.5 in 24 hours? No. 5.Pt's weight >40kg  Yes.   6. Abnormal electrolyte(s): Mag 1.8  7. Electrolytes replaced per protocol 8.  Call MD STAT for K+ </= 2.5, Phos </= 1, or Mag </= 1 Physician:  Marny Lowenstein [facility] 02/05/2022 6:07 AM

## 2022-02-05 NOTE — Transfer of Care (Signed)
Immediate Anesthesia Transfer of Care Note  Patient: Brent [name]  Procedure(s) Performed: ESOPHAGOGASTRODUODENOSCOPY (EGD) WITH PROPOFOL HEMOSTASIS CLIP PLACEMENT SCLEROTHERAPY  Patient Location: Endoscopy Unit  Anesthesia Type:MAC  Level of Consciousness: drowsy  Airway & Oxygen Therapy: Patient Spontanous Breathing and Patient connected to face mask oxygen  Post-op Assessment: Report given to RN and Post -op Vital signs reviewed and stable  Post vital signs: Reviewed and stable  Last Vitals:  Vitals Value Taken Time  BP 172/83 02/05/22 1334  Temp 36.7 C 02/05/22 1334  Pulse 105 02/05/22 1335  Resp 21 02/05/22 1335  SpO2 100 % 02/05/22 1335  Vitals shown include unvalidated device data.  Last Pain:  Vitals:   02/05/22 1334  TempSrc: Temporal  PainSc:          Complications: No notable events documented.

## 2022-02-05 NOTE — Progress Notes (Signed)
eLink Physician-Brief Progress Note Patient Name: Brent [name] DOB: [birth year] MRN: [id number]   Date of Service  02/05/2022  HPI/Events of Note  66 year old male with CAD s/p stent to RCA 11/2021 on DAPT, hx GIB who presents with two day history of hematochezia.  Reports this is similar to his ulcer bleed in March 2020.  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  Presented with Hgb 5.7.  Presently hemodynamically stable.  eICU Interventions  Chart reviewed     Intervention Category Evaluation Type: New Patient Evaluation  Mauri Brooklyn, P 02/05/2022, 1:25 AM

## 2022-02-06 ENCOUNTER — Telehealth: Payer: Self-pay

## 2022-02-06 DIAGNOSIS — D649 Anemia, unspecified: Secondary | ICD-10-CM

## 2022-02-06 DIAGNOSIS — K921 Melena: Secondary | ICD-10-CM | POA: Diagnosis not present

## 2022-02-06 DIAGNOSIS — N179 Acute kidney failure, unspecified: Secondary | ICD-10-CM | POA: Diagnosis not present

## 2022-02-06 DIAGNOSIS — K254 Chronic or unspecified gastric ulcer with hemorrhage: Secondary | ICD-10-CM

## 2022-02-06 DIAGNOSIS — D62 Acute posthemorrhagic anemia: Secondary | ICD-10-CM | POA: Diagnosis not present

## 2022-02-06 LAB — COMPREHENSIVE METABOLIC PANEL
ALT: 15 U/L (ref 0–44)
AST: 22 U/L (ref 15–41)
Albumin: 4 g/dL (ref 3.5–5.0)
Alkaline Phosphatase: 25 U/L — ABNORMAL LOW (ref 38–126)
Anion gap: 8 (ref 5–15)
BUN: 14 mg/dL (ref 8–23)
CO2: 23 mmol/L (ref 22–32)
Calcium: 8.8 mg/dL — ABNORMAL LOW (ref 8.9–10.3)
Chloride: 106 mmol/L (ref 98–111)
Creatinine, Ser: 1.11 mg/dL (ref 0.61–1.24)
GFR, Estimated: >60 mL/min (ref >60)
Glucose, Bld: 161 mg/dL — ABNORMAL HIGH (ref 70–99)
Potassium: 3.8 mmol/L (ref 3.5–5.1)
Sodium: 137 mmol/L (ref 135–145)
Total Bilirubin: 0.9 mg/dL (ref 0.3–1.2)
Total Protein: 6.1 g/dL — ABNORMAL LOW (ref 6.5–8.1)

## 2022-02-06 LAB — PHOSPHORUS: Phosphorus: 3.3 mg/dL (ref 2.5–4.6)

## 2022-02-06 LAB — MISC LABCORP TEST (SEND OUT): Labcorp test code: 83935

## 2022-02-06 LAB — CBC WITH DIFFERENTIAL/PLATELET
Abs Immature Granulocytes: 0.03 10*3/uL (ref 0.00–0.07)
Basophils Absolute: 0 10*3/uL (ref 0.0–0.1)
Basophils Relative: 1 %
Eosinophils Absolute: 0.1 10*3/uL (ref 0.0–0.5)
Eosinophils Relative: 3 %
HCT: 26.7 % — ABNORMAL LOW (ref 39.0–52.0)
Hemoglobin: 8.3 g/dL — ABNORMAL LOW (ref 13.0–17.0)
Immature Granulocytes: 1 %
Lymphocytes Relative: 20 %
Lymphs Abs: 0.8 10*3/uL (ref 0.7–4.0)
MCH: 30.1 pg (ref 26.0–34.0)
MCHC: 31.1 g/dL (ref 30.0–36.0)
MCV: 96.7 fL (ref 80.0–100.0)
Monocytes Absolute: 0.3 10*3/uL (ref 0.1–1.0)
Monocytes Relative: 8 %
Neutro Abs: 2.7 10*3/uL (ref 1.7–7.7)
Neutrophils Relative %: 67 %
Platelets: 170 10*3/uL (ref 150–400)
RBC: 2.76 MIL/uL — ABNORMAL LOW (ref 4.22–5.81)
RDW: 14.3 % (ref 11.5–15.5)
WBC: 4 10*3/uL (ref 4.0–10.5)
nRBC: 0 % (ref 0.0–0.2)

## 2022-02-06 LAB — GLUCOSE, CAPILLARY
Glucose-Capillary: 188 mg/dL — ABNORMAL HIGH (ref 70–99)
Glucose-Capillary: 128 mg/dL — ABNORMAL HIGH (ref 70–99)

## 2022-02-06 LAB — MAGNESIUM: Magnesium: 1.9 mg/dL (ref 1.7–2.4)

## 2022-02-06 MED ORDER — GABAPENTIN 300 MG PO CAPS
900.0000 mg | ORAL_CAPSULE | ORAL | Status: DC
  Administered 2022-02-06 (×2): 900 mg via ORAL
  Filled 2022-02-06 (×2): qty 3

## 2022-02-06 MED ORDER — OXYCODONE HCL 5 MG PO TABS
5.0000 mg | ORAL_TABLET | Freq: Three times a day (TID) | ORAL | Status: DC | PRN
  Administered 2022-02-06 (×2): 5 mg via ORAL
  Filled 2022-02-06 (×2): qty 1

## 2022-02-06 MED ORDER — ISOSORBIDE MONONITRATE ER 60 MG PO TB24
60.0000 mg | ORAL_TABLET | Freq: Every day | ORAL | Status: DC
  Administered 2022-02-06 – 2022-02-07 (×2): 60 mg via ORAL
  Filled 2022-02-06 (×2): qty 1

## 2022-02-06 MED ORDER — GABAPENTIN 300 MG PO CAPS
300.0000 mg | ORAL_CAPSULE | Freq: Two times a day (BID) | ORAL | Status: DC
  Administered 2022-02-07 (×2): 300 mg via ORAL
  Filled 2022-02-06 (×2): qty 1

## 2022-02-06 MED ORDER — ACETAMINOPHEN 500 MG PO TABS: 1000.0000 mg | ORAL_TABLET | Freq: Four times a day (QID) | ORAL | Status: DC | PRN

## 2022-02-06 MED ORDER — FLUTICASONE PROPIONATE 50 MCG/ACT NA SUSP
2.0000 | Freq: Every day | NASAL | Status: DC
  Administered 2022-02-07: 2 via NASAL
  Filled 2022-02-06: qty 16

## 2022-02-06 MED ORDER — METOPROLOL TARTRATE 25 MG PO TABS
100.0000 mg | ORAL_TABLET | Freq: Two times a day (BID) | ORAL | Status: DC
  Administered 2022-02-06 – 2022-02-07 (×3): 100 mg via ORAL
  Filled 2022-02-06 (×3): qty 4

## 2022-02-06 MED ORDER — INSULIN ASPART 100 UNIT/ML IJ SOLN
0.0000 [IU] | Freq: Three times a day (TID) | INTRAMUSCULAR | Status: DC
  Administered 2022-02-06: 2 [IU] via SUBCUTANEOUS
  Administered 2022-02-07: 3 [IU] via SUBCUTANEOUS

## 2022-02-06 MED ORDER — PANTOPRAZOLE SODIUM 40 MG PO TBEC
40.0000 mg | DELAYED_RELEASE_TABLET | Freq: Two times a day (BID) | ORAL | Status: DC
  Administered 2022-02-06 – 2022-02-07 (×2): 40 mg via ORAL
  Filled 2022-02-06 (×2): qty 1

## 2022-02-06 MED ORDER — FAMOTIDINE 20 MG PO TABS: 40.0000 mg | ORAL_TABLET | Freq: Every evening | ORAL | Status: DC | PRN

## 2022-02-06 MED ORDER — EZETIMIBE 10 MG PO TABS
10.0000 mg | ORAL_TABLET | Freq: Every day | ORAL | Status: DC
  Administered 2022-02-07: 10 mg via ORAL
  Filled 2022-02-06: qty 1

## 2022-02-06 NOTE — Progress Notes (Addendum)
Attending physician's note   I have taken a history, reviewed the chart, and examined the patient. I performed a substantive portion of this encounter, including complete performance of at least one of the key components, in conjunction with the APP. I agree with the APP's note, impression, and recommendations with my edits.   Patient feeling better today.  EGD yesterday with marginal ulcer treated with hemostatic clips x3 and epinephrine.  H/H stable.  No overt bleeding.  Plavix was restarted earlier today.  - Continue Protonix and Carafate - Plan is restarted today.  Per Cardiology, holding ASA 81 mg x 2 weeks - Continue serial CBC checks.  If H/H stable and no further overt bleeding, anticipate discharge home tomorrow morning - Advance diet as tolerated - Will repeat outpatient CBC 7-10 days after hospital discharge and coordinate follow-up with Dr. Henrene Pastor as outpatient - I anticipate that we will not do repeat outpatient endoscopy in 6-8 weeks per typical protocol given recent PCI.  Instead will follow clinically and with serial CBC checks as outpatient.  Patient plans to discuss this with Dr. Henrene Pastor, his outpatient Gastroenterologist whom he knows quite well - Inpatient GI service will sign off at this time.  Please do not hesitate to contact me with additional questions or concerns  Gerrit Heck, DO, FACG [phone] office          Progress Note   Subjective  Chief Complaint: GI bleed  EGD 02/05/2022 with marginal ulcer treated with clips and epinephrine  Today, patient tells me he is feeling okay, no further signs of bleeding but he did not get much sleep last night as he woke up with some chest pain.  He then grew worried that they had not given him any of his blood pressure medicines and does not want to stay in the hospital any further.  Also apparently they had him been on Pantoprazole drip and stopped it and then restarted it overnight and he is just not sure if  they know what they are doing.  He has been transferred to hospitalist care and will be moving to the floor soon.   Objective   Vital signs in last 24 hours: Temp:  [97.5 F (36.4 C)-98.9 F (37.2 C)] 98.6 F (37 C) (10/20 0955) Pulse Rate:  [80-115] 107 (10/20 1000) Resp:  [11-34] 14 (10/20 1000) BP: (126-189)/(59-101) 126/74 (10/20 0900) SpO2:  [94 %-100 %] 98 % (10/20 1000) Weight:  [112.5 kg] 112.5 kg (10/20 0447) Last BM Date :  (PTA) General:    white male in NAD Heart:  Regular rate and rhythm; no murmurs Lungs: Respirations even and unlabored, lungs CTA bilaterally Abdomen:  Soft, nontender and nondistended. Normal bowel sounds. Psych:  Cooperative. Normal mood and affect.  Intake/Output from previous day: 10/19 0701 - 10/20 0700 In: 829.2 [I.V.:779.2; IV Piggyback:50] Out: 3220 [Urine:1650] Intake/Output this shift: Total I/O In: 50 [I.V.:50] Out: -   Lab Results: Recent Labs    02/05/22 0607 02/05/22 1431 02/06/22 0926  WBC 3.7* 3.3* 4.0  HGB 7.4* 8.2* 8.3*  HCT 22.2* 25.2* 26.7*  PLT 150 159 170   BMET Recent Labs    02/04/22 1846 02/05/22 0502 02/06/22 0926  NA 138 140 137  K 4.4 4.0 3.8  CL 106 110 106  CO2 '24 24 23  '$ GLUCOSE 137* 137* 161*  BUN 41* 31* 14  CREATININE 1.61* 1.26* 1.11  CALCIUM 8.6* 8.4* 8.8*   LFT Recent Labs  02/06/22 0926  PROT 6.1*  ALBUMIN 4.0  AST 22  ALT 15  ALKPHOS 25*  BILITOT 0.9   PT/INR Recent Labs    02/04/22 1815  LABPROT 14.5  INR 1.1    EGD 02/05/22 Impression:               - Normal esophagus.                           - Roux-en-Y gastrojejunostomy with gastrojejunal                            anastomosis characterized by marginal ulceration.                            Clips (MR conditional) were placed. Injected with                            epinephrine.                           - Normal mucosa was found in the gastric fundus and                            in the gastric body.                            - Normal examined jejunum.                           - No specimens collected. Recommendation:   - Return patient to hospital ward for ongoing care.                           - Advance diet as tolerated.                           - Continue Protonix (pantoprazole) 40 mg IV BID                            today, then if no rebleeding, will transition to                            Protonix (pantoprazole) 40 mg PO BID.                           - Use sucralfate suspension 1 gram PO QID for 4                            weeks.                           - Given recent PCI, patient needs to be                            expeditiously restarted on antiplatelet therapy. I  recommend resuming Plavix (clopidogrel) at prior                            dose tomorrow.                           - Obtain Cardiology input regarding possibly                            holding off on restarting ASA 81 mg for 2 weeks.                           - Continue serial CBC checks with additional blood                            products as needed per protocol                           - Inpatient GI service will continue to follow                           - Due to recent PCI, will make decision regarding                            optimal timing of repeat upper endoscopy as                            outpatient to check ulcer healing at follow-up                            appointment.   Assessment / Plan:   Assessment: 1.  Acute blood loss anemia: Related to recent start of Plavix and aspirin after stenting, EGD 02/05/2022 with marginal ulcer treated with epinephrine and clips, no further bleeding overnight 2.  CAD status post recent stent RCA 11/2021 3.  AKI cyst CKD stage II 4.  GERD  Plan: 1.  Reviewed recent EGD yesterday and findings with the patient. 2.  As per Dr. Bryan Lemma with no further signs of GI bleed can be transition to Pantoprazole 40 twice daily  today 3.  Continue to monitor hemoglobin and transfusion as needed less than 7 while in the hospital 4.  As previously discussed restart Plavix today.  Discussed with cardiology about holding Aspirin for 2 weeks per Dr. Bryan Lemma. 5.  We will go ahead and get the patient a follow-up appointment in our clinic in the next 3 to 4 weeks at that point can discuss timing of repeat EGD (this will depend on anticoagulation) 6.  Patient can be on regular diet today 7.  Continue Carafate 1 g p.o. 4 times daily for 4 weeks 8.  PCP should recheck CBC in 1 week or our office can do this if they are unable  Thank you for your kind consultation, we will sign off.    LOS: 2 days   Levin Erp  02/06/2022, 11:01 AM

## 2022-02-06 NOTE — Progress Notes (Signed)
OT Cancellation Note  Patient Details Name: Brent [name] MRN: [id number] DOB: 1956/04/01   Cancelled Treatment:    Reason Eval/Treat Not Completed: OT screened, no needs identified, will sign off. Patient independent with ambulation and ADLs. Reports no needs from OT or PT.   Romney Compean L Aron Inge 02/06/2022, 11:59 AM

## 2022-02-06 NOTE — Telephone Encounter (Signed)
Patient has been scheduled for a hospital follow up with Ellouise Newer, PA-C on Tuesday, 03/10/22 at 11 am. Appt information sent to [place] and should be on hospital discharge summary. I have sent appt information to patient via MyChart and also mailed a copy.

## 2022-02-06 NOTE — Progress Notes (Signed)
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  He reported a normal health prior to admission except for his recent cardiac stent approximately 3 months ago.  Patient notes that he started having some dizziness and weakness on the day of admission and also noted some dark tarry stools with increased volume with each bowel movement.  Denies syncope, shortness of breath or chest pain and reported that this was similar to his ulcer bleed in March 2020.  He was admitted and GI was consulted and took the patient for an EGD and he was found to have a Roux-en-Y gastrojejunostomy with a gastroduodenal anastomosis characterized by marginal ulceration and clips were placed as well as a being injected.  He remained on a Protonix drip and diet was advanced as tolerated and GI recommended continuing pantoprazole 40 mg p.o. twice daily as well as using sucralfate suspension 1 g p.o. 4 times daily.  Given his recent PCI his Plavix has been held but was restarted on 02/06/2022.  After further discussion with cardiology they felt that it was okay for the patient to hold off on his aspirin for 2 weeks and then resume in 2 weeks.  Overnight the patient had some chest pain and had not gotten any of his blood pressure medications but his chest pain has resolved now.  Given he is got no further signs of GI bleeding is transition to pantoprazole 40 mg p.o. twice daily.  We will need to continue monitor his hemoglobin and  transfuse less than 7.  Diet was advanced to regular diet and patient is improving.  PT OT evaluated and patient was screened and had no needs.   Assessment and Plan: No notes have been filed under this hospital service. Service: Hospitalist  Acute blood loss anemia in setting of upper GI bleeding from gastric ulceration -On Admission Hg 5.7. Baseline 11-13. S/p PRBC x 2 with improved hgb 7.4. 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 Colonoscopy with two 2-21m polyps 08/2021. --PPI IV BID and this is changed to p.o. --Trend CBC and his hemoglobin has been relatively stable in the last check was stable at 8.3/26.7 --Appreciate GI assistance and patient is s/p EGD with ulcer treated with clip x 3 and epi -diet has been advanced to regular diet and GI recommends continue PPI 40 once p.o. twice daily as well as sucralfate 1 g p.o. 3 times daily with meals and nightly   CAD s/p recent stent to RCA 11/27/2021  HTN --Holding DAPT but his Plavix has been resumed and cardiology has given the okay to hold his aspirin for 2 weeks --Resume plavix 10/20 -Cardiology felt it is okay to resume Plavix today and recommended resuming aspirin 2 weeks -Patient had chest pain overnight but his medications have been resumed and this has been resolved --Holding antihypertensive agents but are slowly resuming and resumed his isosorbide mononitrate 60 mg p.o. daily as well as metoprolol tartrate 100 g p.o. twice daily and  continue said to mild and resume statin at discharge   AKI on CKD II --Cr improved with IVF to baseline; patient's BUNs/creatinine is now 14/1.11 --Trend UOP/Cr -Avoid nephrotoxic agents, contrast dyes, hypotension and dehydration to ensure adequate renal perfusion and will need to renally adjust medications -Repeat CMP in a.m.   GERD/GI prophylaxis Hx ulcer --PPI as above and also resuming famotidine for milligrams p.o. 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 daily -Unfortunately unable to resume Livalo given that its not on formulary and patient has reactions to all other statins  Obesity -Complicates overall prognosis and care -Estimated body mass index is 37.71 kg/m as calculated from the following:   Height as of this encounter: '5\' 8"'$  (1.727 m).   Weight as of this encounter: 112.5 kg.  -Weight Loss and Dietary Counseling given  DVT prophylaxis: SCDs Start: 02/04/22 2213    Code Status: Full Code Family Communication: Discussed with wife at bedside  Disposition Plan:  Level of care: Stepdown Status is: Inpatient Remains inpatient appropriate because: Needs further clearance by GI   Consultants:  Gastroenterology Case was discussed with the cardiologist rounding  Procedures:  EGD Findings:      The examined esophagus was normal.      Evidence of a Roux-en-Y gastrojejunostomy was found. The anastamosis was       traversed. The gastrojejunal anastomosis was characterized by a single 5       mm, clean-based marginal ulcer. Given need to resume antiplatelet       therapy, I elected for endoscopic intervention to reduce risk of       rebleeding. In total, three hemostatic clips were successfully placed       (MR conditional). Area was successfully injected with 3 mL of a 1:10,000       solution of epinephrine with appropriate mucosal blanching. Estimated       blood loss was minimal. No exposed surgical staples noted.      Normal mucosa was found in the gastric pouch.      The examined jejunum was normal. Impression:               - Normal esophagus.                           - Roux-en-Y gastrojejunostomy with gastrojejunal                            anastomosis characterized by marginal ulceration.                            Clips  (MR conditional) were placed. Injected with                            epinephrine.                           - Normal mucosa was found in the gastric fundus and                            in the gastric body.                           -  Normal examined jejunum.                           - No specimens collected. Moderate Sedation:      Not Applicable - Patient had care per Anesthesia. Recommendation:           - Return patient to hospital ward for ongoing care.                           - Advance diet as tolerated.                           - Continue Protonix (pantoprazole) 40 mg IV BID                            today, then if no rebleeding, will transition to                            Protonix (pantoprazole) 40 mg PO BID.                           - Use sucralfate suspension 1 gram PO QID for 4                            weeks.                           - Given recent PCI, patient needs to be                            expeditiously restarted on antiplatelet therapy. I                            recommend resuming Plavix (clopidogrel) at prior                            dose tomorrow.                           - Obtain Cardiology input regarding possibly                            holding off on restarting ASA 81 mg for 2 weeks.                           - Continue serial CBC checks with additional blood                            products as needed per protocol                           - Inpatient GI service will continue to follow                           - Due to recent PCI, will make decision regarding  optimal timing of repeat upper endoscopy as                            outpatient to check ulcer healing at follow-up                            appointment.  Antimicrobials:  Anti-infectives (From admission, onward)    None       Subjective: Seen and examined at bedside and he is doing better this morning.  Denies chest pain or shortness  breath.  No lightheadedness or dizziness and no abdominal discomfort.  Feels okay.  No other concerns or points at this time.  Was frustrated overnight about the events of not getting his medications but is improved now.  Objective: Vitals:   02/06/22 1453 02/06/22 1500 02/06/22 1600 02/06/22 1611  BP:    (!) 123/57  Pulse:  85 83 85  Resp:  20 (!) 21 20  Temp: 99.3 F (37.4 C)     TempSrc: Oral     SpO2:  97% 98% 98%  Weight:      Height:        Intake/Output Summary (Last 24 hours) at 02/06/2022 1716 Last data filed at 02/06/2022 0849 Gross per 24 hour  Intake 381.49 ml  Output --  Net 381.49 ml   Filed Weights   02/05/22 0500 02/06/22 0447 02/06/22 1121  Weight: 112.5 kg 112.5 kg 112.5 kg   Examination: Physical Exam:  Constitutional: WN/WD Caucasian male currently no acute distress appears calm Respiratory: Diminished to auscultation bilaterally, no wheezing, rales, rhonchi or crackles. Normal respiratory effort and patient is not tachypenic. No accessory muscle use.  Unlabored breathing Cardiovascular: RRR, no murmurs / rubs / gallops. S1 and S2 auscultated.  No appreciable extremity edema Abdomen: Soft, non-tender, distended secondary body habitus.  Bowel sounds positive.  GU: Deferred. Musculoskeletal: No clubbing / cyanosis of digits/nails. No joint deformity upper and lower extremities.  Skin: No rashes, lesions, ulcers on limited skin evaluation. No induration; Warm and dry.  Neurologic: CN 2-12 grossly intact with no focal deficits. Romberg sign and cerebellar reflexes not assessed.  Psychiatric: Normal judgment and insight. Alert and oriented x 3. Normal mood and appropriate affect.   Data Reviewed: I have personally reviewed following labs and imaging studies  CBC: Recent Labs  Lab 02/04/22 1846 02/05/22 0502 02/05/22 0607 02/05/22 1431 02/06/22 0926  WBC 6.9 3.7* 3.7* 3.3* 4.0  NEUTROABS  --   --   --   --  2.7  HGB 5.7* 7.1* 7.4* 8.2* 8.3*  HCT  17.3* 22.1* 22.2* 25.2* 26.7*  MCV 91.5 92.5 90.6 93.7 96.7  PLT 195 159 150 159 027   Basic Metabolic Panel: Recent Labs  Lab 02/04/22 1846 02/05/22 0502 02/06/22 0926  NA 138 140 137  K 4.4 4.0 3.8  CL 106 110 106  CO2 '24 24 23  '$ GLUCOSE 137* 137* 161*  BUN 41* 31* 14  CREATININE 1.61* 1.26* 1.11  CALCIUM 8.6* 8.4* 8.8*  MG  --  1.8 1.9  PHOS  --   --  3.3   GFR: Estimated Creatinine Clearance: 79.6 mL/min (by C-G formula based on SCr of 1.11 mg/dL). Liver Function Tests: Recent Labs  Lab 02/04/22 1846 02/06/22 0926  AST 14* 22  ALT 11 15  ALKPHOS 21* 25*  BILITOT 0.3 0.9  PROT 5.4* 6.1*  ALBUMIN 3.9 4.0  No results for input(s): "LIPASE", "AMYLASE" in the last 168 hours. No results for input(s): "AMMONIA" in the last 168 hours. Coagulation Profile: Recent Labs  Lab 02/04/22 1815  INR 1.1   Cardiac Enzymes: No results for input(s): "CKTOTAL", "CKMB", "CKMBINDEX", "TROPONINI" in the last 168 hours. BNP (last 3 results) No results for input(s): "PROBNP" in the last 8760 hours. HbA1C: No results for input(s): "HGBA1C" in the last 72 hours. CBG: Recent Labs  Lab 02/04/22 2059 02/06/22 1604  GLUCAP 116* 188*   Lipid Profile: No results for input(s): "CHOL", "HDL", "LDLCALC", "TRIG", "CHOLHDL", "LDLDIRECT" in the last 72 hours. Thyroid Function Tests: No results for input(s): "TSH", "T4TOTAL", "FREET4", "T3FREE", "THYROIDAB" in the last 72 hours. Anemia Panel: No results for input(s): "VITAMINB12", "FOLATE", "FERRITIN", "TIBC", "IRON", "RETICCTPCT" in the last 72 hours. Sepsis Labs: No results for input(s): "PROCALCITON", "LATICACIDVEN" in the last 168 hours.  Recent Results (from the past 240 hour(s))  MRSA Next Gen by PCR, Nasal     Status: None   Collection Time: 02/04/22  9:00 PM   Specimen: Nasal Mucosa; Nasal Swab  Result Value Ref Range Status   MRSA by PCR Next Gen NOT DETECTED NOT DETECTED Final    Comment: (NOTE) The GeneXpert MRSA Assay  (FDA approved for NASAL specimens only), is one component of a comprehensive MRSA colonization surveillance program. It is not intended to diagnose MRSA infection nor to guide or monitor treatment for MRSA infections. Test performance is not FDA approved in patients less than 6 years old. Performed at [facility] [place] [address], [place], [place] [address]     Radiology Studies: No results found.  Scheduled Meds:  Chlorhexidine Gluconate Cloth  6 each Topical Daily   clopidogrel  75 mg Oral Daily   [START ON 02/07/2022] ezetimibe  10 mg Oral Daily   [START ON 02/07/2022] fluticasone  2 spray Each Nare Daily   [START ON 02/07/2022] gabapentin  300 mg Oral BID WC   gabapentin  900 mg Oral 2 times per day   insulin aspart  0-9 Units Subcutaneous TID WC   isosorbide mononitrate  60 mg Oral Daily   metoprolol tartrate  100 mg Oral BID   pantoprazole  40 mg Oral BID   sucralfate  1 g Oral TID WC & HS   Continuous Infusions:   LOS: 2 days   Raiford Noble, DO [facility] Available via Epic secure chat 7am-7pm After these hours, please refer to coverage provider listed on [email or web address] 02/06/2022, 5:16 PM

## 2022-02-06 NOTE — Anesthesia Postprocedure Evaluation (Signed)
Anesthesia Post Note  Patient: Brent [name]  Procedure(s) Performed: ESOPHAGOGASTRODUODENOSCOPY (EGD) WITH PROPOFOL HEMOSTASIS CLIP PLACEMENT SCLEROTHERAPY     Patient location during evaluation: Endoscopy Anesthesia Type: MAC Level of consciousness: awake and alert Pain management: pain level controlled Vital Signs Assessment: post-procedure vital signs reviewed and stable Respiratory status: spontaneous breathing, nonlabored ventilation and respiratory function stable Cardiovascular status: stable and blood pressure returned to baseline Postop Assessment: no apparent nausea or vomiting Anesthetic complications: no   No notable events documented.  Last Vitals:  Vitals:   02/06/22 0955 02/06/22 1000  BP:    Pulse:  (!) 107  Resp:  14  Temp: 37 C   SpO2:  98%    Last Pain:  Vitals:   02/06/22 0955  TempSrc: Oral  PainSc:                  Gayla Benn

## 2022-02-06 NOTE — Progress Notes (Signed)
Pt reported he had angina pain at rest ; assessed pt seems fine and stated it comes off and on. Pt stated it subsided at this time; Pt worried/ concerned about not taken  BP/ angina meds since Wednesday morning. Elink notified/ MD aware, new orders in. Pt lying in bed,stated no pain at this time.

## 2022-02-06 NOTE — Telephone Encounter (Signed)
-----   Message from Veteran, [place] sent at 02/06/2022 11:11 AM EDT ----- Regarding: appt Needs follow-up appointment with me in 3 to 4 weeks for GI bleed  Thanks, JLL

## 2022-02-07 DIAGNOSIS — N179 Acute kidney failure, unspecified: Secondary | ICD-10-CM | POA: Diagnosis not present

## 2022-02-07 DIAGNOSIS — K922 Gastrointestinal hemorrhage, unspecified: Secondary | ICD-10-CM | POA: Diagnosis not present

## 2022-02-07 DIAGNOSIS — D62 Acute posthemorrhagic anemia: Secondary | ICD-10-CM | POA: Diagnosis not present

## 2022-02-07 DIAGNOSIS — K921 Melena: Secondary | ICD-10-CM | POA: Diagnosis not present

## 2022-02-07 LAB — COMPREHENSIVE METABOLIC PANEL
ALT: 15 U/L (ref 0–44)
AST: 21 U/L (ref 15–41)
Albumin: 3.6 g/dL (ref 3.5–5.0)
Alkaline Phosphatase: 24 U/L — ABNORMAL LOW (ref 38–126)
Anion gap: 8 (ref 5–15)
BUN: 12 mg/dL (ref 8–23)
CO2: 23 mmol/L (ref 22–32)
Calcium: 8.8 mg/dL — ABNORMAL LOW (ref 8.9–10.3)
Chloride: 108 mmol/L (ref 98–111)
Creatinine, Ser: 1.15 mg/dL (ref 0.61–1.24)
GFR, Estimated: >60 mL/min (ref >60)
Glucose, Bld: 127 mg/dL — ABNORMAL HIGH (ref 70–99)
Potassium: 3.7 mmol/L (ref 3.5–5.1)
Sodium: 139 mmol/L (ref 135–145)
Total Bilirubin: 0.8 mg/dL (ref 0.3–1.2)
Total Protein: 5.7 g/dL — ABNORMAL LOW (ref 6.5–8.1)

## 2022-02-07 LAB — GLUCOSE, CAPILLARY
Glucose-Capillary: 180 mg/dL — ABNORMAL HIGH (ref 70–99)
Glucose-Capillary: 217 mg/dL — ABNORMAL HIGH (ref 70–99)

## 2022-02-07 LAB — CBC WITH DIFFERENTIAL/PLATELET
Abs Immature Granulocytes: 0.03 10*3/uL (ref 0.00–0.07)
Basophils Absolute: 0 10*3/uL (ref 0.0–0.1)
Basophils Relative: 1 %
Eosinophils Absolute: 0.3 10*3/uL (ref 0.0–0.5)
Eosinophils Relative: 5 %
HCT: 26.2 % — ABNORMAL LOW (ref 39.0–52.0)
Hemoglobin: 8.4 g/dL — ABNORMAL LOW (ref 13.0–17.0)
Immature Granulocytes: 1 %
Lymphocytes Relative: 34 %
Lymphs Abs: 1.6 10*3/uL (ref 0.7–4.0)
MCH: 30.4 pg (ref 26.0–34.0)
MCHC: 32.1 g/dL (ref 30.0–36.0)
MCV: 94.9 fL (ref 80.0–100.0)
Monocytes Absolute: 0.6 10*3/uL (ref 0.1–1.0)
Monocytes Relative: 12 %
Neutro Abs: 2.2 10*3/uL (ref 1.7–7.7)
Neutrophils Relative %: 47 %
Platelets: 182 10*3/uL (ref 150–400)
RBC: 2.76 MIL/uL — ABNORMAL LOW (ref 4.22–5.81)
RDW: 14.5 % (ref 11.5–15.5)
WBC: 4.7 10*3/uL (ref 4.0–10.5)
nRBC: 0 % (ref 0.0–0.2)

## 2022-02-07 LAB — PHOSPHORUS: Phosphorus: 3.8 mg/dL (ref 2.5–4.6)

## 2022-02-07 LAB — MAGNESIUM: Magnesium: 1.6 mg/dL — ABNORMAL LOW (ref 1.7–2.4)

## 2022-02-07 MED ORDER — MAGNESIUM SULFATE 2 GM/50ML IV SOLN
2.0000 g | Freq: Once | INTRAVENOUS | Status: AC
  Administered 2022-02-07: 2 g via INTRAVENOUS
  Filled 2022-02-07: qty 50

## 2022-02-07 MED ORDER — SUCRALFATE 1 GM/10ML PO SUSP: 1.0000 g | Freq: Three times a day (TID) | ORAL | 0 refills | Status: DC

## 2022-02-07 MED ORDER — POTASSIUM CHLORIDE CRYS ER 20 MEQ PO TBCR
40.0000 meq | EXTENDED_RELEASE_TABLET | Freq: Once | ORAL | Status: AC
  Administered 2022-02-07: 40 meq via ORAL
  Filled 2022-02-07: qty 2

## 2022-02-07 MED ORDER — POLYETHYLENE GLYCOL 3350 17 G PO PACK: 17.0000 g | PACK | Freq: Every day | ORAL | 0 refills | Status: DC | PRN

## 2022-02-07 MED ORDER — ASPIRIN 81 MG PO TBEC: 81.0000 mg | DELAYED_RELEASE_TABLET | Freq: Every day | ORAL | 3 refills | Status: DC

## 2022-02-07 NOTE — Progress Notes (Signed)
PT Cancellation Note  Patient Details Name: Brent [name] MRN: [id number] DOB: 18-Sep-1955   Cancelled Treatment:    Reason Eval/Treat Not Completed: PT screened, no needs identified, will sign off [place] Office [phone] Weekend [phone]   Claretha Cooper 02/07/2022, 11:21 AM

## 2022-02-07 NOTE — Discharge Summary (Signed)
Physician Discharge Summary   Patient: Brent [name] MRN: [id number] DOB: [birth year]  Admit date:     02/04/2022  Discharge date: 02/07/22  Discharge Physician: Raiford Noble, DO   PCP: [name], MD   Recommendations at discharge:   Follow-up with PCP within 1 to 2 weeks and repeat CBC, CMP, mag, Phos within 1 week Follow-up with gastroenterology within 1 to 2 weeks and hold aspirin for 2 weeks and then resume Follow-up with cardiology within 1 to 2 weeks  Discharge Diagnoses: Principal Problem:   Lower GI bleed Active Problems:   Acute blood loss anemia   GI bleed   AKI (acute kidney injury) ([place])   Melena   Symptomatic anemia  Resolved Problems:   * No resolved hospital problems. 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  He reported a normal health prior to admission except for his recent cardiac stent approximately 3 months ago.  Patient notes that he started having some dizziness and weakness on the day of admission and also noted some dark tarry stools with increased volume with each bowel movement.  Denies syncope, shortness of breath or chest pain and reported that this was similar to his ulcer bleed in March 2020.  He was admitted and GI was consulted and took the patient for an EGD and he was found to have a Roux-en-Y gastrojejunostomy with a gastroduodenal anastomosis characterized by marginal ulceration and clips were placed as well as a being injected.  He remained on a Protonix drip and diet was advanced as tolerated and GI recommended continuing pantoprazole 40 mg  p.o. twice daily as well as using sucralfate suspension 1 g p.o. 4 times daily.  Given his recent PCI his Plavix has been held but was restarted on 02/06/2022.  After further discussion with cardiology they felt that it was okay for the patient to hold off on his aspirin for 2 weeks and then resume in 2 weeks.   Overnight the patient had some chest pain and had not gotten any of his blood pressure medications but his chest pain has resolved now.  Given he is got no further signs of GI bleeding is transition to pantoprazole 40 mg p.o. twice daily.  We will need to continue monitor his hemoglobin and transfuse less than 7.  Diet was advanced to regular diet and patient is improving.  PT OT evaluated and patient was screened and had no needs. His Hgb remained stable and he is stable to D/C given that he tolerated his meals without issues.    Assessment and Plan: No notes have been filed under this hospital service. Service: Hospitalist  Acute blood loss anemia in setting of upper GI bleeding from gastric ulceration -On Admission Hg 5.7. Baseline 11-13. S/p PRBC x 2 with improved hgb 7.4. 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 Colonoscopy with two 2-34m polyps 08/2021. --PPI IV BID and this is changed to p.o. --Trend CBC and his hemoglobin has been relatively stable in the last check was stable at 8.3/26.7 -> 8.4/26.2 --Appreciate GI assistance and patient is  s/p EGD with ulcer treated with clip x 3 and epi -diet has been advanced to regular diet and GI recommends continue PPI 40 once p.o. twice daily as well as sucralfate 1 g p.o. 3 times daily with meals and nightly -Gastroenterology within 2 weeks and continuing his PPI twice daily as well as Plavix; resume aspirin in 2 weeks   CAD s/p recent stent to RCA 11/27/2021  HTN --Holding DAPT but his Plavix has been resumed and cardiology has given the okay to hold his aspirin for 2 weeks --Resume plavix  10/20 -Cardiology felt it is okay to resume Plavix yesterday and recommended resuming aspirin 2 weeks -Patient had chest pain overnight but his medications have been resumed and this has been resolved --Holding antihypertensive agents but are slowly resuming and resumed his isosorbide mononitrate 60 mg p.o. daily as well as metoprolol tartrate 100 g p.o. twice daily and continue said to mild and resume statin at discharge   Hypomagnesemia -Patient's mag level is 1.6 -Replete with IV mag sulfate 2 g -Continue monitor and replete as necessary -Repeat mag level within 1 week  AKI on CKD II --Cr improved with IVF to baseline; patient's BUNs/creatinine is now 14/1.11 yesterday and today is now 12/1.15 --Trend UOP/Cr -Avoid nephrotoxic agents, contrast dyes, hypotension and dehydration to ensure adequate renal perfusion and will need to renally adjust medications -Repeat CMP in a.m.   GERD/GI prophylaxis Hx ulcer --PPI as above and also resuming famotidine for milligrams p.o. 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 daily -Unfortunately unable to resume Livalo given that its not on formulary and patient has reactions to all other statins   Obesity -Complicates overall prognosis and care -Estimated body mass index is 37.71 kg/m as calculated from the following:   Height as of this encounter: '5\' 8"'$  (1.727 m).   Weight as of this encounter: 112.5 kg.  -Weight Loss and Dietary Counseling given  Consultants: Gastroenterology  Procedures performed: EGD Disposition: Home Diet recommendation:  Discharge Diet Orders (From admission, onward)     Start     Ordered   02/07/22 0000  Diet - low sodium heart healthy        02/07/22 1153   02/07/22 0000  Diet Carb Modified        02/07/22 1153            Cardiac and Carb modified diet DISCHARGE MEDICATION: Allergies as of 02/07/2022       Reactions   Bee Venom Anaphylaxis   Lipitor [atorvastatin] Other (See Comments)   Myalgias, memory changes   Morphine Other (See Comments)   hyperactive   Metformin And Related Other (See Comments)   Myalgias and weakness   Lioresal [baclofen] Other (See Comments)   Acted drunk   Medrol [methylprednisolone] Other (See Comments)   Hyperglycemia    Zocor [simvastatin] Other (See Comments)   Joint pain   Hydrocodone Itching   Latex Rash   Pravachol [pravastatin] Other (See Comments)   Joint pain        Medication List     STOP taking these medications    econazole nitrate 1 % cream       TAKE these medications    acetaminophen 500 MG tablet Commonly known as: TYLENOL Take 1,000 mg by mouth every 6 (six) hours as needed for moderate  pain or headache.   amitriptyline 50 MG tablet Commonly known as: ELAVIL TAKE 1 TABLET AT BEDTIME   amoxicillin 500 MG capsule Commonly known as: AMOXIL Take 2,000 mg by mouth See admin instructions. Take 4 capsules (2,000 mg) prior to dental appointments   aspirin EC 81 MG tablet Take 1 tablet (81 mg total) by mouth daily. Swallow whole. Start taking on: February 21, 2022 What changed: These instructions start on February 21, 2022. If you are unsure what to do until then, ask your doctor or other care provider.   clopidogrel 75 MG tablet Commonly known as: PLAVIX Take 1 tablet (75 mg total) by mouth daily with breakfast.   EPINEPHrine 0.3 mg/0.3 mL Soaj injection Commonly known as: EPI-PEN INJECT 0.3 MLS (0.3 MG TOTAL) INTO THE MUSCLE ONCE FOR 1 DOSE What changed:  how much to take how to take this when to take this reasons to take this additional instructions   ezetimibe 10 MG tablet Commonly known as: ZETIA Take 1 tablet (10 mg total) by mouth daily.   famotidine 40 MG tablet Commonly known as: PEPCID TAKE 1 TABLET AT  BEDTIME AS NEEDED FOR HEARTBURN OR INDIGESTION What changed: See the new instructions.   fenofibrate 160 MG tablet Take 1 tablet (160 mg total) by mouth daily.   fluticasone 50 MCG/ACT nasal spray Commonly known as: FLONASE USE 2 SPRAYS IN EACH NOSTRIL DAILY   gabapentin 300 MG capsule Commonly known as: NEURONTIN TAKE 1 CAPSULE TWICE A DAY AND 2 CAPSULES AT BEDTIME What changed: See the new instructions.   hydrochlorothiazide 12.5 MG capsule Commonly known as: MICROZIDE Take 1 capsule (12.5 mg total) by mouth daily.   IRON-C PO Take 1 tablet by mouth daily. Iron 65 mg and vit C-125 mg   isosorbide mononitrate 30 MG 24 hr tablet Commonly known as: IMDUR Take 2 tablets (60 mg total) by mouth daily.   Livalo 2 MG Tabs Generic drug: Pitavastatin Calcium Take 1 tablet (2 mg total) by mouth daily.   losartan 50 MG tablet Commonly known as: COZAAR TAKE 1 TABLET DAILY (KEEP UPCOMING APPOINTMENT IN MARCH 2023 WITH CARDIOLOGIST BEFORE ANYMORE REFILLS) What changed: See the new instructions.   Magnesium 100 MG Caps Take 1 capsule (100 mg total) by mouth daily.   metoprolol tartrate 100 MG tablet Commonly known as: LOPRESSOR Take 1 tablet (100 mg total) by mouth 2 (two) times daily.   mometasone 0.1 % cream Commonly known as: Elocon Apply 1 Application topically daily. What changed:  when to take this reasons to take this   multivitamin tablet Take 1 tablet by mouth daily.   nitroGLYCERIN 0.4 MG SL tablet Commonly known as: Nitrostat Place 1 tablet (0.4 mg total) under the tongue every 5 (five) minutes as needed.   oxycodone 5 MG capsule Commonly known as: OXY-IR Take 5 mg by mouth 3 (three) times daily as needed for pain.   pantoprazole 40 MG tablet Commonly known as: PROTONIX TAKE 1 TABLET TWICE A DAY BEFORE MEALS What changed: See the new instructions.   polyethylene glycol 17 g packet Commonly known as: MIRALAX / GLYCOLAX Take 17 g by mouth daily as needed  for moderate constipation.   sucralfate 1 GM/10ML suspension Commonly known as: CARAFATE Take 10 mLs (1 g total) by mouth 4 (four) times daily -  with meals and at bedtime for 28 days.   Trulicity 1.5 OF/7.5ZW Sopn Generic drug: Dulaglutide Inject 3 mg into the skin once a week. Saturday  Vitamin B-12 1000 MCG Subl Place 1,000 mcg under the tongue 2 (two) times a week.   Vitamin D 50 MCG (2000 UT) tablet Take 6,000 Units by mouth daily.        Discharge Exam: Filed Weights   02/05/22 0500 02/06/22 0447 02/06/22 1121  Weight: 112.5 kg 112.5 kg 112.5 kg   Vitals:   02/07/22 1200 02/07/22 1231  BP: 129/66   Pulse: 80   Resp: (!) 23   Temp:  97.7 F (36.5 C)  SpO2: 98%    Examination: Physical Exam:  Constitutional: WN/WD obese Caucasian male in NAD appears calm and comfortable Respiratory: Diminished to auscultation bilaterally, no wheezing, rales, rhonchi or crackles. Normal respiratory effort and patient is not tachypenic. No accessory muscle use. Unlabored Breathing   Cardiovascular: RRR, no murmurs / rubs / gallops. S1 and S2 auscultated. No extremity edema.  Abdomen: Soft, non-tender, Distended 2/2 body habitus. Bowel sounds positive.  GU: Deferred. Musculoskeletal: No clubbing / cyanosis of digits/nails. No joint deformity upper and lower extremities.  Skin: No rashes, lesions, ulcers on a limited skin evaluation. No induration; Warm and dry.  Neurologic: CN 2-12 grossly intact with no focal deficits. Romberg sign and cerebellar reflexes not assessed.  Psychiatric: Normal judgment and insight. Alert and oriented x 3. Normal mood and appropriate affect.   Condition at discharge: stable  The results of significant diagnostics from this hospitalization (including imaging, microbiology, ancillary and laboratory) are listed below for reference.   Imaging Studies: No results found.  Microbiology: Results for orders placed or performed during the hospital encounter  of 02/04/22  MRSA Next Gen by PCR, Nasal     Status: None   Collection Time: 02/04/22  9:00 PM   Specimen: Nasal Mucosa; Nasal Swab  Result Value Ref Range Status   MRSA by PCR Next Gen NOT DETECTED NOT DETECTED Final    Comment: (NOTE) The GeneXpert MRSA Assay (FDA approved for NASAL specimens only), is one component of a comprehensive MRSA colonization surveillance program. It is not intended to diagnose MRSA infection nor to guide or monitor treatment for MRSA infections. Test performance is not FDA approved in patients less than 67 years old. Performed at [facility], [place] [address] [place], [place] [address]    Labs: CBC: Recent Labs  Lab 02/05/22 0502 02/05/22 3244 02/05/22 1431 02/06/22 0926 02/07/22 0258  WBC 3.7* 3.7* 3.3* 4.0 4.7  NEUTROABS  --   --   --  2.7 2.2  HGB 7.1* 7.4* 8.2* 8.3* 8.4*  HCT 22.1* 22.2* 25.2* 26.7* 26.2*  MCV 92.5 90.6 93.7 96.7 94.9  PLT 159 150 159 170 010   Basic Metabolic Panel: Recent Labs  Lab 02/04/22 1846 02/05/22 0502 02/06/22 0926 02/07/22 0258  NA 138 140 137 139  K 4.4 4.0 3.8 3.7  CL 106 110 106 108  CO2 '24 24 23 23  '$ GLUCOSE 137* 137* 161* 127*  BUN 41* 31* 14 12  CREATININE 1.61* 1.26* 1.11 1.15  CALCIUM 8.6* 8.4* 8.8* 8.8*  MG  --  1.8 1.9 1.6*  PHOS  --   --  3.3 3.8   Liver Function Tests: Recent Labs  Lab 02/04/22 1846 02/06/22 0926 02/07/22 0258  AST 14* 22 21  ALT '11 15 15  '$ ALKPHOS 21* 25* 24*  BILITOT 0.3 0.9 0.8  PROT 5.4* 6.1* 5.7*  ALBUMIN 3.9 4.0 3.6   CBG: Recent Labs  Lab 02/04/22 2059 02/06/22 1604 02/06/22 2111 02/07/22 0843 02/07/22  [place]   Discharge time spent: greater than 30 minutes.  Signed: Raiford Noble, DO [facility] 02/07/2022

## 2022-02-08 LAB — BPAM RBC
Blood Product Expiration Date: 202311142359
Blood Product Expiration Date: 202311142359
ISSUE DATE / TIME: 202310182313
Unit Type and Rh: 6200
Unit Type and Rh: 6200

## 2022-02-08 LAB — TYPE AND SCREEN
ABO/RH(D): A POS
Antibody Screen: NEGATIVE
Unit division: 0
Unit division: 0

## 2022-02-09 ENCOUNTER — Telehealth: Payer: Self-pay

## 2022-02-09 ENCOUNTER — Encounter (HOSPITAL_COMMUNITY): Payer: Self-pay | Admitting: Gastroenterology

## 2022-02-09 NOTE — Telephone Encounter (Signed)
Transition Care Management Follow-up Telephone Call Date of discharge and from where: [place] 02-07-22 Dx: Lower GI bleed How have you been since you were released from the hospital? Doing ok  Any questions or concerns? No  Items Reviewed: Did the pt receive and understand the discharge instructions provided? Yes  Medications obtained and verified? Yes  Other? No  Any new allergies since your discharge? No  Dietary orders reviewed? Yes Do you have support at home? Yes   Home Care and Equipment/Supplies: Were home health services ordered? no If so, what is the name of the agency? na  Has the agency set up a time to come to the patient's home? not applicable Were any new equipment or medical supplies ordered?  No What is the name of the medical supply agency? na Were you able to get the supplies/equipment? not applicable Do you have any questions related to the use of the equipment or supplies? No  Functional Questionnaire: (I = Independent and D = Dependent) ADLs: I  Bathing/Dressing- I  Meal Prep- I  Eating- I  Maintaining continence- I  Transferring/Ambulation- I  Managing Meds- I  Follow up appointments reviewed:  PCP Hospital f/u appt confirmed? Yes  Scheduled to see Ed Saguier on 02-12-22 @ [facility] f/u appt confirmed? Yes  Scheduled to see Dr Ignatius Specking on 03-10-22 @ 11am. Are transportation arrangements needed? No  If their condition worsens, is the pt aware to call PCP or go to the Emergency Dept.? Yes Was the patient provided with contact information for the PCP's office or ED? Yes Was to pt encouraged to call back with questions or concerns? Yes   Juanda Crumble LPN [facility] Dial [phone]

## 2022-02-12 ENCOUNTER — Inpatient Hospital Stay: Payer: Medicare Other | Admitting: Medical

## 2022-02-17 ENCOUNTER — Ambulatory Visit (INDEPENDENT_AMBULATORY_CARE_PROVIDER_SITE_OTHER): Payer: Medicare Other | Admitting: Medical

## 2022-02-17 VITALS — BP 112/60 | HR 72 | Temp 98.0°F | Resp 18 | Ht 68.0 in | Wt 256.0 lb

## 2022-02-17 DIAGNOSIS — R944 Abnormal results of kidney function studies: Secondary | ICD-10-CM

## 2022-02-17 DIAGNOSIS — D649 Anemia, unspecified: Secondary | ICD-10-CM

## 2022-02-17 DIAGNOSIS — R790 Abnormal level of blood mineral: Secondary | ICD-10-CM

## 2022-02-17 DIAGNOSIS — K922 Gastrointestinal hemorrhage, unspecified: Secondary | ICD-10-CM | POA: Diagnosis not present

## 2022-02-17 LAB — COMPREHENSIVE METABOLIC PANEL
ALT: 14 U/L (ref 0–53)
AST: 20 U/L (ref 0–37)
Albumin: 4.1 g/dL (ref 3.5–5.2)
Alkaline Phosphatase: 31 U/L — ABNORMAL LOW (ref 39–117)
BUN: 18 mg/dL (ref 6–23)
CO2: 27 mEq/L (ref 19–32)
Calcium: 9 mg/dL (ref 8.4–10.5)
Chloride: 106 mEq/L (ref 96–112)
Creatinine, Ser: 1.21 mg/dL (ref 0.40–1.50)
GFR: 62.45 mL/min (ref >60.00)
Glucose, Bld: 129 mg/dL — ABNORMAL HIGH (ref 70–99)
Potassium: 4.7 mEq/L (ref 3.5–5.1)
Sodium: 139 mEq/L (ref 135–145)
Total Bilirubin: 0.5 mg/dL (ref 0.2–1.2)
Total Protein: 5.8 g/dL — ABNORMAL LOW (ref 6.0–8.3)

## 2022-02-17 LAB — CBC WITH DIFFERENTIAL/PLATELET
Basophils Absolute: 0 10*3/uL (ref 0.0–0.1)
Basophils Relative: 0.6 % (ref 0.0–3.0)
Eosinophils Absolute: 0.2 10*3/uL (ref 0.0–0.7)
Eosinophils Relative: 4 % (ref 0.0–5.0)
HCT: 26.6 % — ABNORMAL LOW (ref 39.0–52.0)
Hemoglobin: 8.8 g/dL — ABNORMAL LOW (ref 13.0–17.0)
Lymphocytes Relative: 24.4 % (ref 12.0–46.0)
Lymphs Abs: 1.2 10*3/uL (ref 0.7–4.0)
MCHC: 32.9 g/dL (ref 30.0–36.0)
MCV: 91.2 fl (ref 78.0–100.0)
Monocytes Absolute: 0.4 10*3/uL (ref 0.1–1.0)
Monocytes Relative: 9.1 % (ref 3.0–12.0)
Neutro Abs: 3 10*3/uL (ref 1.4–7.7)
Neutrophils Relative %: 61.9 % (ref 43.0–77.0)
Platelets: 205 10*3/uL (ref 150.0–400.0)
RBC: 2.94 Mil/uL — ABNORMAL LOW (ref 4.22–5.81)
RDW: 14.6 % (ref 11.5–15.5)
WBC: 4.8 10*3/uL (ref 4.0–10.5)

## 2022-02-17 LAB — MAGNESIUM: Magnesium: 1.6 mg/dL (ref 1.5–2.5)

## 2022-02-17 NOTE — Progress Notes (Signed)
Subjective:    Patient ID: Brent [name], male    DOB: November 15, 1955, 66 y.o.   MRN: [id number]  HPI Pt in for hospital follow up.  Admit date:     02/04/2022  Discharge date: 02/07/22  Discharge Physician: Raiford Noble, DO    PCP: [name], MD    Recommendations at discharge:    Follow-up with PCP within 1 to 2 weeks and repeat CBC, CMP, mag, Phos within 1 week Follow-up with gastroenterology within 1 to 2 weeks and hold aspirin for 2 weeks and then resume Follow-up with cardiology within 1 to 2 weeks   Discharge Diagnoses: Principal Problem:   Lower GI bleed Active Problems:   Acute blood loss anemia   GI bleed   AKI (acute kidney injury) ([place])   Melena   Symptomatic anemia   Resolved Problems:   * No resolved hospital problems. 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  He reported a normal health prior to admission except for his recent cardiac stent approximately 3 months ago.  Patient notes that he started having some dizziness and weakness on the day of admission and also noted some dark tarry stools with increased volume with each bowel movement.  Denies syncope, shortness of breath or chest pain and reported that this was similar to his ulcer bleed in March 2020.  He was admitted and GI was consulted and took the patient for an EGD and he was found to have a Roux-en-Y gastrojejunostomy with a gastroduodenal anastomosis characterized by marginal ulceration and clips were placed as well as a being injected.  He remained on a Protonix drip and diet was advanced as  tolerated and GI recommended continuing pantoprazole 40 mg p.o. twice daily as well as using sucralfate suspension 1 g p.o. 4 times daily.  Given his recent PCI his Plavix has been held but was restarted on 02/06/2022.  After further discussion with cardiology they felt that it was okay for the patient to hold off on his aspirin for 2 weeks and then resume in 2 weeks.   Overnight the patient had some chest pain and had not gotten any of his blood pressure medications but his chest pain has resolved now.  Given he is got no further signs of GI bleeding is transition to pantoprazole 40 mg p.o. twice daily.  We will need to continue monitor his hemoglobin and transfuse less than 7.  Diet was advanced to regular diet and patient is improving.  PT OT evaluated and patient was screened and had no needs. His Hgb remained stable and he is stable to D/C given that he tolerated his meals without issues.    Assessment and Plan: No notes have been filed under this hospital service. Service: Hospitalist   Acute blood loss anemia in setting of upper GI bleeding from gastric ulceration -On Admission Hg 5.7. Baseline 11-13. S/p PRBC x 2 with improved hgb 7.4. 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 Colonoscopy with two 2-36m polyps 08/2021. --PPI IV BID and this is changed to p.o. --Trend  CBC and his hemoglobin has been relatively stable in the last check was stable at 8.3/26.7 -> 8.4/26.2 --Appreciate GI assistance and patient is s/p EGD with ulcer treated with clip x 3 and epi -diet has been advanced to regular diet and GI recommends continue PPI 40 once p.o. twice daily as well as sucralfate 1 g p.o. 3 times daily with meals and nightly -Gastroenterology within 2 weeks and continuing his PPI twice daily as well as Plavix; resume aspirin in 2 weeks   CAD s/p recent stent to RCA 11/27/2021  HTN --Holding DAPT but his Plavix has been resumed and cardiology has given the okay  to hold his aspirin for 2 weeks --Resume plavix 10/20 -Cardiology felt it is okay to resume Plavix yesterday and recommended resuming aspirin 2 weeks -Patient had chest pain overnight but his medications have been resumed and this has been resolved --Holding antihypertensive agents but are slowly resuming and resumed his isosorbide mononitrate 60 mg p.o. daily as well as metoprolol tartrate 100 g p.o. twice daily and continue said to mild and resume statin at discharge   Hypomagnesemia -Patient's mag level is 1.6 -Replete with IV mag sulfate 2 g -Continue monitor and replete as necessary -Repeat mag level within 1 week   AKI on CKD II --Cr improved with IVF to baseline; patient's BUNs/creatinine is now 14/1.11 yesterday and today is now 12/1.15 --Trend UOP/Cr -Avoid nephrotoxic agents, contrast dyes, hypotension and dehydration to ensure adequate renal perfusion and will need to renally adjust medications -Repeat CMP in a.m.   GERD/GI prophylaxis Hx ulcer --PPI as above and also resuming famotidine for milligrams p.o. 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 daily -Unfortunately unable to resume Livalo given that its not on formulary and patient has reactions to all other statins   Obesity -Complicates overall prognosis and care -Estimated body mass index is 37.71 kg/m as calculated from the following:   Height as of this encounter: '5\' 8"'$  (1.727 m).   Weight as of this encounter: 112.5 kg.  -Weight Loss and Dietary Counseling given   Consultants: Gastroenterology  Procedures performed: EGD Disposition: Home Diet recommendation: "   Pt states his energy is good. Not tired as before. No black or bloody stool. No abdomen pain. Pt is on protinix.    Not on above marginal ulcer found.  Clips placed and injected. Pt notes after stets placed. Pt is on plavix. Hospital dc'd the asprin.  No chest pain. Pt states in hospital they put him back on imdur.  Pt follow up on March 10, 2022 with gi.  On March 02, 2020 will check iron and hb/hct.     Review of Systems  Constitutional:  Negative for chills, fatigue and fever.  Respiratory:  Negative for cough, chest tightness and shortness of breath.   Cardiovascular:  Negative for chest pain and palpitations.  Gastrointestinal:  Negative for abdominal pain, constipation, diarrhea, nausea and vomiting.  Genitourinary:  Negative for difficulty urinating, enuresis, frequency, hematuria, penile pain, scrotal swelling and testicular pain.  Musculoskeletal:  Negative for back pain, myalgias and neck stiffness.  Skin:  Negative for rash.   Past Medical History:  Diagnosis Date   Acid reflux disease    ACID REFLUX DISEASE 07/05/2007   Acute gastric ulcer with bleeding    Alcohol abuse    Anemia  Atherosclerosis    Benign neoplasm of colon 07/05/2007   Bilateral hip pain 10/05/2016   Bladder cancer ([place])    Breast pain, left 11/17/2011   CAD (coronary artery disease)    Chest pain    a. Reportedly negative dobut echo performed prior to gastric bypass in 03/2011;  b. 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 12/2011 Cath: LM nl, LAD 50p, D1 15m LCX min irregs, OM3 30, RCA 25p, 746mFFR 0.99->0.89), PDA 30, EF 65%, Med Rx. d. Cath 11/2021 - 90% m RCA s/p PCI/DES x1   COLONIC POLYPS, HX OF 10/29/2008   Diarrhea 06/13/2010   Diverticulosis 07/05/2018   DIVERTICULOSIS, COLON 10/29/2008   DM (diabetes mellitus), type 2, uncontrolled    GI bleed    Gout 03/04/2013   Hearing loss 05/24/2013   Previous audiology evaluation completely.   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   Neck pain 03/2015   Other and unspecified hyperlipidemia 11/16/2012   Post-operative nausea and vomiting    after the surgery in hospital in March 2020/ past the cauderization   Preventative health care 11/17/2011   Renal stone 11/2013   Sleep apnea    a. CPAP   Spinal stenosis    Tear of meniscus of left knee 2012     Social History   Socioeconomic History   Marital status: Married    Spouse name: Not on file   Number of children: 2   Years of education: Not on file   Highest education level: Not on file  Occupational History   Occupation: TEACHER    Employer: [employer]  Tobacco Use   Smoking status: Former    Packs/day: 1.50    Years: 20.00    Total pack years: 30.00    Types: Cigarettes    Quit date: 04/21/1991    Years since quitting: 30.8   Smokeless tobacco: Never  Vaping Use   Vaping Use: Never used  Substance and Sexual Activity   Alcohol use: Yes    Alcohol/week: 3.0 standard drinks of alcohol    Types: 3 Cans of beer per week    Comment: weekly   Drug use: No   Sexual activity: Not on file  Other Topics Concern   Not on file  Social History Narrative   [place] in [place] Does not routinely exercise.   Social Determinants of Health   Financial Resource Strain: Low Risk  (12/01/2021)   Overall Financial Resource Strain (CARDIA)    Difficulty of Paying Living Expenses: Not hard at all  Food Insecurity: No Food Insecurity (02/06/2022)   Hunger Vital Sign    Worried About Running Out of Food in the Last Year: Never true    Ran Out of Food in the Last Year: Never true  Transportation Needs: No Transportation Needs (02/06/2022)   PRAPARE - TrHydrologistMedical): No    Lack of Transportation (Non-Medical): No  Physical Activity: Inactive (12/01/2021)   Exercise Vital Sign    Days of Exercise  per Week: 0 days    Minutes of Exercise per Session: 0 min  Stress: No Stress Concern Present (12/01/2021)   [employer]  Occupational Health - Occupational Stress Questionnaire    Feeling of Stress : Not at all  Social Connections: Socially Integrated (12/01/2021)   Social Connection and Isolation Panel [NHANES]    Frequency of Communication with Friends and Family: More than three times a week    Frequency of Social Gatherings with Friends and Family: More than three times a week    Attends Religious Services: More than 4 times per year    Active Member of [employer] or Organizations: Yes    Attends Music therapist: More than 4 times per year    Marital Status: Married  Human resources officer Violence: Not At Risk (02/06/2022)   Humiliation, Afraid, Rape, and Kick questionnaire    Fear of Current or Ex-Partner: No    Emotionally Abused: No    Physically Abused: No    Sexually Abused: No    Past Surgical History:  Procedure Laterality Date   ANKLE SURGERY Right [place]     left side   CARDIAC CATHETERIZATION     denies any chest pain in the past 2 years   COLONOSCOPY     colonoscopy polyps     CORONARY STENT INTERVENTION N/A 11/28/2021   Procedure: CORONARY STENT INTERVENTION;  Surgeon: Martinique, Peter M, MD;  Location: [facility];  Service: Cardiovascular;  Laterality: N/A;   ESOPHAGOGASTRODUODENOSCOPY N/A 03/27/2013   Procedure: ESOPHAGOGASTRODUODENOSCOPY (EGD);  Surgeon: Irene Shipper, MD;  Location: [facility];  Service: Endoscopy;  Laterality: N/A;   ESOPHAGOGASTRODUODENOSCOPY (EGD) WITH PROPOFOL N/A 07/06/2018   Procedure: ESOPHAGOGASTRODUODENOSCOPY (EGD) WITH PROPOFOL;  Surgeon: Jerene Bears, MD;  Location: [facility];  Service: Gastroenterology;  Laterality: N/A;   ESOPHAGOGASTRODUODENOSCOPY (EGD) WITH PROPOFOL N/A 02/05/2022   Procedure: ESOPHAGOGASTRODUODENOSCOPY (EGD) WITH PROPOFOL;  Surgeon: Lavena Bullion, DO;  Location: [facility];  Service: Gastroenterology;  Laterality: N/A;   GASTRIC BYPASS     HEMOSTASIS CLIP PLACEMENT  02/05/2022   Procedure: HEMOSTASIS CLIP PLACEMENT;  Surgeon: Lavena Bullion, DO;  Location: [facility];  Service: Gastroenterology;;   HERNIA REPAIR     HOT HEMOSTASIS N/A 07/06/2018   Procedure: HOT HEMOSTASIS (ARGON PLASMA COAGULATION/BICAP);  Surgeon: Jerene Bears, MD;  Location: [facility];  Service: Gastroenterology;  Laterality: N/A;   KNEE ARTHROSCOPY Left 11/06/2010   Left, torn meniscus (repaired)   LEFT HEART CATH AND CORONARY ANGIOGRAPHY N/A 11/28/2021   Procedure: LEFT HEART CATH AND CORONARY ANGIOGRAPHY;  Surgeon: Martinique, Peter M, MD;  Location: [facility];  Service: Cardiovascular;  Laterality: N/A;   LEFT HEART CATHETERIZATION WITH CORONARY ANGIOGRAM N/A 12/23/2011   Procedure: LEFT HEART CATHETERIZATION WITH CORONARY ANGIOGRAM;  Surgeon: Minus Breeding, MD;  Location: [facility] LAB;  Service: Cardiovascular;  Laterality: N/A;   REPLACEMENT TOTAL KNEE Right 01/2016   removed scar tissue/ cut tip of nerve bundle and re-route   right knee arthroscopy Right 07/05/14   Dr. Hart Robinsons, [place].   ROTATOR CUFF REPAIR  2019   left   SCHLEROTHERAPY  07/06/2018   Procedure: SCHLEROTHERAPY;  Surgeon: Jerene Bears, MD;  Location: [facility];  Service: Gastroenterology;;   Clide Deutscher  02/05/2022   Procedure: Clide Deutscher;  Surgeon: Lavena Bullion, DO;  Location: [facility];  Service: Gastroenterology;;   TONSILLECTOMY  age 74   TONSILLECTOMY     as a child   TOTAL KNEE ARTHROPLASTY Right 01/20/2016   Procedure: RIGHT TOTAL KNEE ARTHROPLASTY;  Surgeon: Paralee Cancel, MD;  Location: [facility];  Service: Orthopedics;  Laterality: Right;   TRANSURETHRAL RESECTION OF BLADDER TUMOR N/A 09/24/2020   Procedure: TRANSURETHRAL RESECTION OF BLADDER TUMOR (TURBT)  RIGHT retrograde pylegram;  Surgeon: [name], MD;  Location: [facility];  Service: Urology;  Laterality:  N/A;   UPPER GI ENDOSCOPY  03/27/13    Family History  Problem Relation Age of Onset   Diabetes Mother    Hypertension Mother    Stroke Mother    Hyperlipidemia Mother    Hypertension Father    Colon polyps Father    Heart attack Father 29   Stroke Father    Heart attack Brother    Diabetes Brother    Heart disease Brother    Heart attack Brother        Multiple   Diabetes Brother    Diabetes Sister    Stroke Sister    Obesity Brother    Diabetes Brother    Heart disease Brother    Hypertension Maternal Grandmother    ADD / ADHD Daughter    Heart disease Brother    Stomach cancer Neg Hx    Colon cancer Neg Hx    Esophageal cancer Neg Hx    Rectal cancer Neg Hx     Allergies  Allergen Reactions   Bee Venom Anaphylaxis   Lipitor [Atorvastatin] Other (See Comments)    Myalgias, memory changes   Morphine Other (See Comments)    hyperactive   Metformin And Related Other (See Comments)    Myalgias and weakness   Lioresal [Baclofen] Other (See Comments)    Acted drunk   Medrol [Methylprednisolone] Other (See Comments)    Hyperglycemia    Zocor [Simvastatin] Other (See Comments)    Joint pain   Hydrocodone Itching   Latex Rash   Pravachol [Pravastatin] Other (See Comments)    Joint pain    Current Outpatient Medications on File Prior to Visit  Medication Sig Dispense Refill   acetaminophen (TYLENOL) 500 MG tablet Take 1,000 mg by mouth every 6 (six) hours as needed for moderate pain or headache.     amitriptyline (ELAVIL) 50 MG tablet TAKE 1 TABLET AT BEDTIME (Patient taking differently: Take 50 mg by mouth at bedtime.) 90 tablet 3   amoxicillin (AMOXIL) 500 MG capsule Take 2,000 mg by mouth See admin instructions. Take 4 capsules (2,000 mg) prior to dental appointments (Patient not taking: Reported on 02/09/2022)     [START ON 02/21/2022] aspirin EC 81 MG tablet Take 1 tablet (81 mg total) by mouth daily. Swallow whole. (Patient not taking: Reported on 02/09/2022)  90 tablet 3   Cholecalciferol (VITAMIN D) 50 MCG (2000 UT) tablet Take 6,000 Units by mouth daily.     clopidogrel (PLAVIX) 75 MG tablet Take 1 tablet (75 mg total) by mouth daily with breakfast. 90 tablet 3   Cyanocobalamin (VITAMIN B-12) 1000 MCG SUBL Place 1,000 mcg under the tongue 2 (two) times a week.     Dulaglutide (TRULICITY) 1.5 JO/8.4ZY SOPN Inject 3 mg into the skin once a week. Saturday     EPINEPHrine 0.3 mg/0.3 mL IJ SOAJ injection INJECT 0.3 MLS (0.3 MG TOTAL) INTO THE MUSCLE ONCE FOR 1 DOSE (Patient taking differently: Inject 0.3 mg into the muscle as needed for anaphylaxis.) 2 each 11   ezetimibe (ZETIA) 10 MG tablet Take 1 tablet (10 mg total) by mouth daily. 90 tablet 3   famotidine (PEPCID) 40 MG tablet TAKE 1 TABLET AT BEDTIME AS NEEDED FOR HEARTBURN OR  INDIGESTION (Patient taking differently: Take 40 mg by mouth at bedtime as needed for heartburn or indigestion.) 90 tablet 3   fenofibrate 160 MG tablet Take 1 tablet (160 mg total) by mouth daily. 90 tablet 1   Ferrous Gluconate-C-Folic Acid (IRON-C PO) Take 1 tablet by mouth daily. Iron 65 mg and vit C-125 mg     fluticasone (FLONASE) 50 MCG/ACT nasal spray USE 2 SPRAYS IN EACH NOSTRIL DAILY 48 g 3   gabapentin (NEURONTIN) 300 MG capsule TAKE 1 CAPSULE TWICE A DAY AND 2 CAPSULES AT BEDTIME (Patient taking differently: Take 300-900 mg by mouth See admin instructions. Take 1 capsule (300 mg) by mouth in the morning and at noon then take 3 capsules (900 mg) in the afternoon and at bedtime) 360 capsule 1   hydrochlorothiazide (MICROZIDE) 12.5 MG capsule Take 1 capsule (12.5 mg total) by mouth daily. 90 capsule 1   isosorbide mononitrate (IMDUR) 30 MG 24 hr tablet Take 2 tablets (60 mg total) by mouth daily. 180 tablet 3   losartan (COZAAR) 50 MG tablet TAKE 1 TABLET DAILY (KEEP UPCOMING APPOINTMENT IN MARCH 2023 WITH CARDIOLOGIST BEFORE ANYMORE REFILLS) (Patient taking differently: Take 50 mg by mouth daily.) 90 tablet 3   Magnesium  100 MG CAPS Take 1 capsule (100 mg total) by mouth daily. 30 capsule 1   metoprolol tartrate (LOPRESSOR) 100 MG tablet Take 1 tablet (100 mg total) by mouth 2 (two) times daily. 180 tablet 3   mometasone (ELOCON) 0.1 % cream Apply 1 Application topically daily. (Patient taking differently: Apply 1 Application topically as needed (Poison ivy).) 90 g 1   Multiple Vitamin (MULTIVITAMIN) tablet Take 1 tablet by mouth daily.     nitroGLYCERIN (NITROSTAT) 0.4 MG SL tablet Place 1 tablet (0.4 mg total) under the tongue every 5 (five) minutes as needed. 25 tablet 11   oxycodone (OXY-IR) 5 MG capsule Take 5 mg by mouth 3 (three) times daily as needed for pain.     pantoprazole (PROTONIX) 40 MG tablet TAKE 1 TABLET TWICE A DAY BEFORE MEALS (Patient taking differently: Take 40 mg by mouth 2 (two) times daily before a meal.) 180 tablet 3   Pitavastatin Calcium (LIVALO) 2 MG TABS Take 1 tablet (2 mg total) by mouth daily. 90 tablet 3   polyethylene glycol (MIRALAX / GLYCOLAX) 17 g packet Take 17 g by mouth daily as needed for moderate constipation. 14 each 0   sucralfate (CARAFATE) 1 GM/10ML suspension Take 10 mLs (1 g total) by mouth 4 (four) times daily -  with meals and at bedtime for 28 days. (Patient not taking: Reported on 02/09/2022) 1120 mL 0   No current facility-administered medications on file prior to visit.    BP 112/60   Pulse 72   Temp 98 F (36.7 C)   Resp 18   Ht '5\' 8"'$  (1.727 m)   Wt 256 lb (116.1 kg)   SpO2 99%   BMI 38.92 kg/m        Objective:   Physical Exam  General Mental Status- Alert. General Appearance- Not in acute distress.   Skin General: Color- Normal Color. Moisture- Normal Moisture.  Neck Carotid Arteries- Normal color. Moisture- Normal Moisture. No carotid bruits. No JVD.  Chest and Lung Exam Auscultation: Breath Sounds:-Normal.  Cardiovascular Auscultation:Rythm- Regular. Murmurs & Other Heart Sounds:Auscultation of the heart reveals- No  Murmurs.  Abdomen Inspection:-Inspeection Normal. Palpation/Percussion:Note:No mass. Palpation and Percussion of the abdomen reveal- Non Tender, Non Distended + BS, no  rebound or guarding.   Neurologic Cranial Nerve exam:- CN III-XII intact(No nystagmus), symmetric smile. Strength:- 5/5 equal and symmetric strength both upper and lower extremities.       Assessment & Plan:   Patient Instructions  Blood loss from gastric ulceration. Last hb/hct improved and stable compared to admission. Continue protonix and follow up with GI MD. If any bright red stools or black tarry stools let us know.  Do want you to get cbc today to verify still stable. Keep follow up appt with hematologist as well.  Keep gi appointment upcoming.  CAD s/p recent stent to RCA 11/27/2021  HTN --Holding DAPT but his Plavix has been resumed and cardiology has given the okay to hold his aspirin for 2 weeks --Resume plavix 10/20 -Cardiology felt it is okay to resume Plavix yesterday and recommended resuming aspirin 2 weeks -Patient had chest pain overnight but his medications have been resumed and this has been resolved --Holding antihypertensive agents but are slowly resuming and resumed his isosorbide mononitrate 60 mg p.o. daily as well as metoprolol tartrate 100 g p.o. twice daily and continue said to mild and resume statin at discharge   Low magnesium- repeat magnesium per dc summary note.  AKI- recheck cmp today.  Follow up date to be determined after lab review.      Mackie Pai, PA-C

## 2022-02-17 NOTE — Patient Instructions (Addendum)
Blood loss from gastric ulceration. Last hb/hct improved and stable compared to admission. Continue protonix and follow up with GI MD. If any bright red stools or black tarry stools let us know.  Do want you to get cbc today to verify still stable. Keep follow up appt with hematologist as well.  Keep gi appointment upcoming.  CAD s/p recent stent to RCA 11/27/2021  HTN --Holding DAPT but his Plavix has been resumed and cardiology has given the okay to hold his aspirin for 2 weeks --Resume plavix 10/20 -Cardiology felt it is okay to resume Plavix yesterday and recommended resuming aspirin 2 weeks -Patient had chest pain overnight but his medications have been resumed and this has been resolved --Holding antihypertensive agents but are slowly resuming and resumed his isosorbide mononitrate 60 mg p.o. daily as well as metoprolol tartrate 100 g p.o. twice daily and continue said to mild and resume statin at discharge   Low magnesium- repeat magnesium per dc summary note.  AKI- recheck cmp today.  Follow up date to be determined after lab review.

## 2022-02-27 ENCOUNTER — Ambulatory Visit: Payer: Medicare Other | Admitting: Family

## 2022-02-27 ENCOUNTER — Other Ambulatory Visit: Payer: Self-pay

## 2022-02-27 ENCOUNTER — Other Ambulatory Visit: Payer: Medicare Other

## 2022-02-27 MED ORDER — ISOSORBIDE MONONITRATE ER 30 MG PO TB24: 60.0000 mg | ORAL_TABLET | Freq: Every day | ORAL | 3 refills | Status: DC

## 2022-03-02 ENCOUNTER — Inpatient Hospital Stay: Payer: Medicare Other

## 2022-03-02 ENCOUNTER — Inpatient Hospital Stay: Payer: Medicare Other | Attending: Hematology & Oncology | Admitting: Family

## 2022-03-02 ENCOUNTER — Encounter: Payer: Self-pay | Admitting: Family

## 2022-03-02 VITALS — BP 119/57 | HR 84 | Temp 98.1°F | Resp 20 | Ht 68.0 in | Wt 254.0 lb

## 2022-03-02 DIAGNOSIS — Z8711 Personal history of peptic ulcer disease: Secondary | ICD-10-CM | POA: Insufficient documentation

## 2022-03-02 DIAGNOSIS — D500 Iron deficiency anemia secondary to blood loss (chronic): Secondary | ICD-10-CM | POA: Diagnosis not present

## 2022-03-02 DIAGNOSIS — D509 Iron deficiency anemia, unspecified: Secondary | ICD-10-CM | POA: Diagnosis not present

## 2022-03-02 DIAGNOSIS — Z79899 Other long term (current) drug therapy: Secondary | ICD-10-CM | POA: Insufficient documentation

## 2022-03-02 DIAGNOSIS — G4730 Sleep apnea, unspecified: Secondary | ICD-10-CM | POA: Diagnosis not present

## 2022-03-02 DIAGNOSIS — K922 Gastrointestinal hemorrhage, unspecified: Secondary | ICD-10-CM | POA: Diagnosis not present

## 2022-03-02 DIAGNOSIS — Z9884 Bariatric surgery status: Secondary | ICD-10-CM | POA: Diagnosis not present

## 2022-03-02 DIAGNOSIS — I2510 Atherosclerotic heart disease of native coronary artery without angina pectoris: Secondary | ICD-10-CM

## 2022-03-02 DIAGNOSIS — G4733 Obstructive sleep apnea (adult) (pediatric): Secondary | ICD-10-CM | POA: Diagnosis not present

## 2022-03-02 DIAGNOSIS — E782 Mixed hyperlipidemia: Secondary | ICD-10-CM

## 2022-03-02 LAB — CBC WITH DIFFERENTIAL (CANCER CENTER ONLY)
Abs Immature Granulocytes: 0.01 10*3/uL (ref 0.00–0.07)
Basophils Absolute: 0 10*3/uL (ref 0.0–0.1)
Basophils Relative: 1 %
Eosinophils Absolute: 0.2 10*3/uL (ref 0.0–0.5)
Eosinophils Relative: 3 %
HCT: 33.2 % — ABNORMAL LOW (ref 39.0–52.0)
Hemoglobin: 10.4 g/dL — ABNORMAL LOW (ref 13.0–17.0)
Immature Granulocytes: 0 %
Lymphocytes Relative: 21 %
Lymphs Abs: 1.1 10*3/uL (ref 0.7–4.0)
MCH: 29.8 pg (ref 26.0–34.0)
MCHC: 31.3 g/dL (ref 30.0–36.0)
MCV: 95.1 fL (ref 80.0–100.0)
Monocytes Absolute: 0.4 10*3/uL (ref 0.1–1.0)
Monocytes Relative: 7 %
Neutro Abs: 3.5 10*3/uL (ref 1.7–7.7)
Neutrophils Relative %: 68 %
Platelet Count: 184 10*3/uL (ref 150–400)
RBC: 3.49 MIL/uL — ABNORMAL LOW (ref 4.22–5.81)
RDW: 13.8 % (ref 11.5–15.5)
WBC Count: 5.1 10*3/uL (ref 4.0–10.5)
nRBC: 0 % (ref 0.0–0.2)

## 2022-03-02 LAB — RETICULOCYTES
Immature Retic Fract: 20.4 % — ABNORMAL HIGH (ref 2.3–15.9)
RBC.: 3.49 MIL/uL — ABNORMAL LOW (ref 4.22–5.81)
Retic Count, Absolute: 112.7 10*3/uL (ref 19.0–186.0)
Retic Ct Pct: 3.2 % — ABNORMAL HIGH (ref 0.4–3.1)

## 2022-03-02 LAB — IRON AND IRON BINDING CAPACITY (CC-WL,HP ONLY)
Iron: 42 ug/dL — ABNORMAL LOW (ref 45–182)
Saturation Ratios: 10 % — ABNORMAL LOW (ref 17.9–39.5)
TIBC: 419 ug/dL (ref 250–450)
UIBC: 377 ug/dL — ABNORMAL HIGH (ref 117–376)

## 2022-03-02 LAB — FERRITIN: Ferritin: 39 ng/mL (ref 24–336)

## 2022-03-02 NOTE — Progress Notes (Signed)
Hematology and Oncology Follow Up Visit  Brent [name] [id number] 28-May-1955 66 y.o. 03/02/2022   Principle Diagnosis:  Iron deficiency anemia secondary to malabsorption and history of acute GI bleed   Current Therapy:        IV iron as indicated    Interim History:  Brent [name] is here today for follow-up. He was recently hospitalized in October with anemia secondary to GI blood loss from gastric ulcer.  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  His energy is slowly improving.  No blood loss noted. No abnormal bruising, no petechiae.  No fever, chills, n/v, cough, rash, dizziness, SOB, chest pain, palpitations, abdominal pain or changes in bowel or bladder habits.  No swelling or tenderness in his extremities.  No falls or syncope.  Appetite and hydration are good. Weight is stable at 254 lbs.   ECOG Performance Status: 1 - Symptomatic but completely ambulatory  Medications:  Allergies as of 03/02/2022       Reactions   Bee Venom Anaphylaxis   Lipitor [atorvastatin] Other (See Comments)   Myalgias, memory changes   Morphine Other (See Comments)   hyperactive   Metformin And Related Other (See Comments)   Myalgias and weakness   Lioresal [baclofen] Other (See Comments)   Acted drunk   Medrol [methylprednisolone] Other (See Comments)   Hyperglycemia    Zocor [simvastatin] Other (See Comments)   Joint pain   Hydrocodone Itching   Latex Rash   Pravachol [pravastatin] Other (See Comments)   Joint pain        Medication List        Accurate as of March 02, 2022 12:59 PM. If you have any questions, ask your nurse or doctor.          acetaminophen 500 MG tablet Commonly known as: TYLENOL Take 1,000 mg by mouth every 6 (six) hours as needed for moderate pain or headache.   amitriptyline 50 MG tablet Commonly known as: ELAVIL TAKE 1 TABLET AT BEDTIME   amoxicillin 500 MG capsule Commonly known as: AMOXIL Take 2,000 mg by mouth See admin  instructions. Take 4 capsules (2,000 mg) prior to dental appointments   aspirin EC 81 MG tablet Take 1 tablet (81 mg total) by mouth daily. Swallow whole.   clopidogrel 75 MG tablet Commonly known as: PLAVIX Take 1 tablet (75 mg total) by mouth daily with breakfast.   EPINEPHrine 0.3 mg/0.3 mL Soaj injection Commonly known as: EPI-PEN INJECT 0.3 MLS (0.3 MG TOTAL) INTO THE MUSCLE ONCE FOR 1 DOSE What changed:  how much to take how to take this when to take this reasons to take this additional instructions   ezetimibe 10 MG tablet Commonly known as: ZETIA Take 1 tablet (10 mg total) by mouth daily.   famotidine 40 MG tablet Commonly known as: PEPCID TAKE 1 TABLET AT BEDTIME AS NEEDED FOR HEARTBURN OR INDIGESTION What changed: See the new instructions.   fenofibrate 160 MG tablet Take 1 tablet (160 mg total) by mouth daily.   fluticasone 50 MCG/ACT nasal spray Commonly known as: FLONASE USE 2 SPRAYS IN EACH NOSTRIL DAILY   gabapentin 300 MG capsule Commonly known as: NEURONTIN TAKE 1 CAPSULE TWICE A DAY AND 2 CAPSULES AT BEDTIME What changed: See the new instructions.   hydrochlorothiazide 12.5 MG capsule Commonly known as: MICROZIDE Take 1 capsule (12.5 mg total) by mouth daily.   IRON-C PO Take 1 tablet by mouth daily. Iron 65 mg and  vit C-125 mg   isosorbide mononitrate 30 MG 24 hr tablet Commonly known as: IMDUR Take 2 tablets (60 mg total) by mouth daily.   Livalo 2 MG Tabs Generic drug: Pitavastatin Calcium Take 1 tablet (2 mg total) by mouth daily.   losartan 50 MG tablet Commonly known as: COZAAR TAKE 1 TABLET DAILY (KEEP UPCOMING APPOINTMENT IN MARCH 2023 WITH CARDIOLOGIST BEFORE ANYMORE REFILLS) What changed: See the new instructions.   Magnesium 100 MG Caps Take 1 capsule (100 mg total) by mouth daily.   metoprolol tartrate 100 MG tablet Commonly known as: LOPRESSOR Take 1 tablet (100 mg total) by mouth 2 (two) times daily.   mometasone 0.1  % cream Commonly known as: Elocon Apply 1 Application topically daily. What changed:  when to take this reasons to take this   multivitamin tablet Take 1 tablet by mouth daily.   nitroGLYCERIN 0.4 MG SL tablet Commonly known as: Nitrostat Place 1 tablet (0.4 mg total) under the tongue every 5 (five) minutes as needed.   oxycodone 5 MG capsule Commonly known as: OXY-IR Take 5 mg by mouth 3 (three) times daily as needed for pain.   pantoprazole 40 MG tablet Commonly known as: PROTONIX TAKE 1 TABLET TWICE A DAY BEFORE MEALS What changed: See the new instructions.   polyethylene glycol 17 g packet Commonly known as: MIRALAX / GLYCOLAX Take 17 g by mouth daily as needed for moderate constipation.   sucralfate 1 GM/10ML suspension Commonly known as: CARAFATE Take 10 mLs (1 g total) by mouth 4 (four) times daily -  with meals and at bedtime for 28 days.   Trulicity 1.5 ZO/1.0RU Sopn Generic drug: Dulaglutide Inject 3 mg into the skin once a week. Saturday   Vitamin B-12 1000 MCG Subl Place 1,000 mcg under the tongue 2 (two) times a week.   Vitamin D 50 MCG (2000 UT) tablet Take 6,000 Units by mouth daily.        Allergies:  Allergies  Allergen Reactions   Bee Venom Anaphylaxis   Lipitor [Atorvastatin] Other (See Comments)    Myalgias, memory changes   Morphine Other (See Comments)    hyperactive   Metformin And Related Other (See Comments)    Myalgias and weakness   Lioresal [Baclofen] Other (See Comments)    Acted drunk   Medrol [Methylprednisolone] Other (See Comments)    Hyperglycemia    Zocor [Simvastatin] Other (See Comments)    Joint pain   Hydrocodone Itching   Latex Rash   Pravachol [Pravastatin] Other (See Comments)    Joint pain    Past Medical History, Surgical history, Social history, and Family History were reviewed and updated.  Review of Systems: All other 10 point review of systems is negative.   Physical Exam:  vitals were not taken  for this visit.   Wt Readings from Last 3 Encounters:  02/17/22 256 lb (116.1 kg)  02/06/22 248 lb 0.3 oz (112.5 kg)  01/13/22 261 lb (118.4 kg)    Ocular: Sclerae unicteric, pupils equal, round and reactive to light Ear-nose-throat: Oropharynx clear, dentition fair Lymphatic: No cervical or supraclavicular adenopathy Lungs no rales or rhonchi, good excursion bilaterally Heart regular rate and rhythm, no murmur appreciated Abd soft, nontender, positive bowel sounds MSK no focal spinal tenderness, no joint edema Neuro: non-focal, well-oriented, appropriate affect Breasts: Deferred   Lab Results  Component Value Date   WBC 5.1 03/02/2022   HGB 10.4 (L) 03/02/2022   HCT 33.2 (L) 03/02/2022  MCV 95.1 03/02/2022   PLT 184 03/02/2022   Lab Results  Component Value Date   FERRITIN 168 11/18/2021   IRON 104 11/18/2021   TIBC 386 11/18/2021   UIBC 282 11/18/2021   IRONPCTSAT 27 11/18/2021   Lab Results  Component Value Date   RETICCTPCT 3.2 (H) 03/02/2022   RBC 3.49 (L) 03/02/2022   RBC 3.49 (L) 03/02/2022   No results found for: "KPAFRELGTCHN", "LAMBDASER", "KAPLAMBRATIO" No results found for: "IGGSERUM", "IGA", "IGMSERUM" No results found for: "TOTALPROTELP", "ALBUMINELP", "A1GS", "A2GS", "BETS", "BETA2SER", "GAMS", "MSPIKE", "SPEI"   Chemistry      Component Value Date/Time   NA 139 02/17/2022 1100   NA 139 12/08/2021 1121   K 4.7 02/17/2022 1100   CL 106 02/17/2022 1100   CO2 27 02/17/2022 1100   BUN 18 02/17/2022 1100   BUN 21 12/08/2021 1121   CREATININE 1.21 02/17/2022 1100   CREATININE 1.19 11/18/2021 1131   CREATININE 1.19 01/11/2020 1543      Component Value Date/Time   CALCIUM 9.0 02/17/2022 1100   ALKPHOS 31 (L) 02/17/2022 1100   AST 20 02/17/2022 1100   AST 21 11/18/2021 1131   ALT 14 02/17/2022 1100   ALT 15 11/18/2021 1131   BILITOT 0.5 02/17/2022 1100   BILITOT 0.7 11/18/2021 1131       Impression and Plan: Brent [name] is a very pleasant 66  yo caucasian gentleman with iron deficiency anemia secondary to malabsorption (gastric bypass) as well intermittent GI blood loss with ulcer.  Iron studies are pending.  Lab check every 2 months, follow-up in 6 months.   Lottie Dawson, NP 11/13/202312:59 PM

## 2022-03-10 ENCOUNTER — Ambulatory Visit (INDEPENDENT_AMBULATORY_CARE_PROVIDER_SITE_OTHER): Payer: Medicare Other | Admitting: Physician Assistant

## 2022-03-10 ENCOUNTER — Encounter: Payer: Self-pay | Admitting: Physician Assistant

## 2022-03-10 ENCOUNTER — Telehealth: Payer: Self-pay | Admitting: *Deleted

## 2022-03-10 VITALS — BP 148/72 | HR 85 | Ht 68.0 in | Wt 260.0 lb

## 2022-03-10 DIAGNOSIS — D509 Iron deficiency anemia, unspecified: Secondary | ICD-10-CM | POA: Diagnosis not present

## 2022-03-10 DIAGNOSIS — K289 Gastrojejunal ulcer, unspecified as acute or chronic, without hemorrhage or perforation: Secondary | ICD-10-CM | POA: Diagnosis not present

## 2022-03-10 DIAGNOSIS — K219 Gastro-esophageal reflux disease without esophagitis: Secondary | ICD-10-CM | POA: Diagnosis not present

## 2022-03-10 NOTE — Telephone Encounter (Signed)
[facility] Pre-operative Risk Assessment     Request for surgical clearance:     Endoscopy Procedure  What type of surgery is being performed?     Upper endoscopy  When is this surgery scheduled?     TBD  What type of clearance is required ?   Pharmacy/Cardiac  Are there any medications that need to be held prior to surgery and how long? Plavix 5 days  Practice name and name of physician performing surgery?      [facility]  What is your office phone and fax number?      Phone- [phone]  [phone]  Anesthesia type (None, local, MAC, general) ?       MAC

## 2022-03-10 NOTE — Progress Notes (Signed)
Chief Complaint: Follow up hospitalization for GI Bleed  HPI:  Brent [name] is a  66 y/o Caucasian male, known to Dr. Henrene Pastor, with a past medical history as listed below including Roux-en-Y gastric bypass, OSA, CAD status post recent stent on RCA 8/23 with dual antiplatelet therapy (12/08/2021 echo with normal LVEF 55-60%), who presents to clinic today for follow-up after recent hospitalization for GI bleed.      02/05/2022 patient seen in consultation by our service for melena in the hospital.  At the time patient presented hemoglobin 5.7 status post 2 units PRBCs up to 7.4.  At that time Plavix and aspirin were on hold patient started on a twice daily PPI and scheduled for EGD.    02/05/2022 EGD with Roux-en-Y gastrojejunostomy with gastrojejunal anastomosis characterized by marginal ulceration clipped and injected with epinephrine.  Otherwise normal.  Patient told to continue Pantoprazole 40 p.o. twice daily x 8 weeks and Carafate 1 g p.o. 4 times daily for 4 weeks.  Plavix was restarted.  Recommended repeat EGD at some point but due to recent PCI this would need to be discussed at follow-up.    03/02/2022 CBC with a hemoglobin of 10.4 (8.8 02/17/2022).  Ferritin 39.  Iron low at 42% saturation low at 10.  Patient follow-up with hematology oncology on that day for his iron deficiency anemia and was planned to have iron infusions.    Today, the patient tells me that he is doing well.  No problems at all.  Following with hematology/ oncology for his continual blood counts and iron infusions if he needs them.  Still on Pantoprazole 40 mg twice a day and not having any problems.    Denies fever, chills, weight loss, abdominal pain or melena.  GI history: 09/01/2021 EGD: Showed status post Roux-en-Y gastric bypass and otherwise normal 09/01/2021 colonoscopy: Two 2-3 mm polyps in the sigmoid colon and cecum, pathology showed adenoma and repeat recommended in 5 years 07/17/2021 office visit with Brent Bogus,  PA-C: That visit discussed hepatomegaly some mild anemia and iron deficiency from time to time at least part and due to absorption issues from his gastric bypass; recommended colonoscopy with Dr. Henrene Pastor, continued on Pantoprazole 40 twice daily and Pepcid for reflux and history of marginal ulcer 07/2016 colonoscopy: 2 mm polyp removed, internal hemorrhoids, pathology showed tubular adenoma and recall placed for 5 years 08/2018 EGD: Follow-up from an endoscopy in March 2020, found to have anatomy consistent with Roux-en-Y gastric bypass and had confirmed a healed marginal ulcer that was seen in March 2020   Past Medical History:  Diagnosis Date   Acid reflux disease    ACID REFLUX DISEASE 07/05/2007   Acute gastric ulcer with bleeding    Alcohol abuse    Anemia    Atherosclerosis    Benign neoplasm of colon 07/05/2007   Bilateral hip pain 10/05/2016   Bladder cancer ([place])    Breast pain, left 11/17/2011   CAD (coronary artery disease)    Chest pain    a. Reportedly negative dobut echo performed prior to gastric bypass in 03/2011;  b. 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 12/2011 Cath: LM nl, LAD 50p, D1 70m LCX min irregs, OM3 30, RCA 25p, 787mFFR 0.99->0.89), PDA 30, EF 65%, Med Rx. d. Cath 11/2021 - 90% m RCA s/p PCI/DES x1   COLONIC POLYPS, HX OF 10/29/2008   Diarrhea 06/13/2010   Diverticulosis 07/05/2018   DIVERTICULOSIS, COLON 10/29/2008   DM (diabetes mellitus),  type 2, uncontrolled    GI bleed    Gout 03/04/2013   Hearing loss 05/24/2013   Previous audiology evaluation completely.   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   Neck pain 03/2015   Other and unspecified hyperlipidemia 11/16/2012   Post-operative nausea and  vomiting    after the surgery in hospital in March 2020/ past the cauderization   Preventative health care 11/17/2011   Renal stone 11/2013   Sleep apnea    a. CPAP   Spinal stenosis    Tear of meniscus of left knee 2012    Past Surgical History:  Procedure Laterality Date   ANKLE SURGERY Right 1994   BICEPS TENDON REPAIR     left side   CARDIAC CATHETERIZATION     denies any chest pain in the past 2 years   COLONOSCOPY     colonoscopy polyps     CORONARY STENT INTERVENTION N/A 11/28/2021   Procedure: CORONARY STENT INTERVENTION;  Surgeon: Martinique, Peter M, MD;  Location: [facility];  Service: Cardiovascular;  Laterality: N/A;   ESOPHAGOGASTRODUODENOSCOPY N/A 03/27/2013   Procedure: ESOPHAGOGASTRODUODENOSCOPY (EGD);  Surgeon: Irene Shipper, MD;  Location: [facility];  Service: Endoscopy;  Laterality: N/A;   ESOPHAGOGASTRODUODENOSCOPY (EGD) WITH PROPOFOL N/A 07/06/2018   Procedure: ESOPHAGOGASTRODUODENOSCOPY (EGD) WITH PROPOFOL;  Surgeon: Jerene Bears, MD;  Location: [facility];  Service: Gastroenterology;  Laterality: N/A;   ESOPHAGOGASTRODUODENOSCOPY (EGD) WITH PROPOFOL N/A 02/05/2022   Procedure: ESOPHAGOGASTRODUODENOSCOPY (EGD) WITH PROPOFOL;  Surgeon: Lavena Bullion, DO;  Location: [facility];  Service: Gastroenterology;  Laterality: N/A;   GASTRIC BYPASS     HEMOSTASIS CLIP PLACEMENT  02/05/2022   Procedure: HEMOSTASIS CLIP PLACEMENT;  Surgeon: Lavena Bullion, DO;  Location: [facility];  Service: Gastroenterology;;   HERNIA REPAIR     HOT HEMOSTASIS N/A 07/06/2018   Procedure: HOT HEMOSTASIS (ARGON PLASMA COAGULATION/BICAP);  Surgeon: Jerene Bears, MD;  Location: [facility];  Service: Gastroenterology;  Laterality: N/A;   KNEE ARTHROSCOPY Left 11/06/2010   Left, torn meniscus (repaired)   LEFT HEART CATH AND CORONARY ANGIOGRAPHY N/A 11/28/2021   Procedure: LEFT HEART CATH AND CORONARY ANGIOGRAPHY;  Surgeon: Martinique, Peter M, MD;  Location: [facility];  Service: Cardiovascular;  Laterality: N/A;   LEFT HEART CATHETERIZATION WITH CORONARY ANGIOGRAM N/A 12/23/2011   Procedure: LEFT HEART CATHETERIZATION WITH CORONARY ANGIOGRAM;  Surgeon: Minus Breeding, MD;  Location: [facility];  Service: Cardiovascular;  Laterality: N/A;   REPLACEMENT TOTAL KNEE Right 01/2016   removed scar tissue/ cut tip of nerve bundle and re-route   right knee arthroscopy Right 07/05/14   Dr. Hart Robinsons, [place].   ROTATOR CUFF REPAIR  2019   left   SCHLEROTHERAPY  07/06/2018   Procedure: SCHLEROTHERAPY;  Surgeon: Jerene Bears, MD;  Location: [facility];  Service: Gastroenterology;;   Clide Deutscher  02/05/2022   Procedure: Clide Deutscher;  Surgeon: Lavena Bullion, DO;  Location: [facility];  Service: Gastroenterology;;   TONSILLECTOMY  age 36   TONSILLECTOMY     as a child   TOTAL KNEE ARTHROPLASTY Right 01/20/2016   Procedure: RIGHT TOTAL KNEE ARTHROPLASTY;  Surgeon: Paralee Cancel, MD;  Location: [facility];  Service: Orthopedics;  Laterality: Right;   TRANSURETHRAL RESECTION OF BLADDER TUMOR N/A 09/24/2020   Procedure: TRANSURETHRAL RESECTION OF BLADDER TUMOR (TURBT)  RIGHT retrograde pylegram;  Surgeon: [name], MD;  Location: [facility];  Service: Urology;  Laterality: N/A;   UPPER GI ENDOSCOPY  03/27/13    Current Outpatient Medications  Medication Sig Dispense Refill   acetaminophen (TYLENOL) 500 MG tablet Take 1,000 mg by mouth every 6 (six) hours as needed for moderate pain or headache.     amitriptyline (ELAVIL) 50 MG tablet TAKE 1 TABLET AT BEDTIME (Patient taking differently: Take 50 mg by mouth at bedtime.) 90 tablet 3   amoxicillin (AMOXIL) 500 MG capsule Take 2,000 mg by mouth See admin instructions. Take 4 capsules (2,000 mg) prior to dental appointments (Patient not taking: Reported on 02/09/2022)     Cholecalciferol (VITAMIN D) 50 MCG (2000 UT) tablet Take 6,000 Units by mouth daily.     clopidogrel (PLAVIX) 75 MG tablet Take 1 tablet  (75 mg total) by mouth daily with breakfast. 90 tablet 3   Cyanocobalamin (VITAMIN B-12) 1000 MCG SUBL Place 1,000 mcg under the tongue 2 (two) times a week.     Dulaglutide (TRULICITY) 1.5 DD/2.2GU SOPN Inject 3 mg into the skin once a week. Saturday     EPINEPHrine 0.3 mg/0.3 mL IJ SOAJ injection INJECT 0.3 MLS (0.3 MG TOTAL) INTO THE MUSCLE ONCE FOR 1 DOSE (Patient not taking: Reported on 03/02/2022) 2 each 11   ezetimibe (ZETIA) 10 MG tablet Take 1 tablet (10 mg total) by mouth daily. 90 tablet 3   famotidine (PEPCID) 40 MG tablet TAKE 1 TABLET AT BEDTIME AS NEEDED FOR HEARTBURN OR INDIGESTION (Patient not taking: Reported on 03/02/2022) 90 tablet 3   fenofibrate 160 MG tablet Take 1 tablet (160 mg total) by mouth daily. 90 tablet 1   Ferrous Gluconate-C-Folic Acid (IRON-C PO) Take 1 tablet by mouth daily. Iron 65 mg and vit C-125 mg     fluticasone (FLONASE) 50 MCG/ACT nasal spray USE 2 SPRAYS IN EACH NOSTRIL DAILY 48 g 3   gabapentin (NEURONTIN) 300 MG capsule TAKE 1 CAPSULE TWICE A DAY AND 2 CAPSULES AT BEDTIME (Patient taking differently: Take 300-900 mg by mouth See admin instructions. Take 1 capsule (300 mg) by mouth in the morning and at noon then take 3 capsules (900 mg) in the afternoon and at bedtime) 360 capsule 1   hydrochlorothiazide (MICROZIDE) 12.5 MG capsule Take 1 capsule (12.5 mg total) by mouth daily. 90 capsule 1   isosorbide mononitrate (IMDUR) 30 MG 24 hr tablet Take 2 tablets (60 mg total) by mouth daily. 180 tablet 3   losartan (COZAAR) 50 MG tablet TAKE 1 TABLET DAILY (KEEP UPCOMING APPOINTMENT IN MARCH 2023 WITH CARDIOLOGIST BEFORE ANYMORE REFILLS) (Patient taking differently: Take 50 mg by mouth daily.) 90 tablet 3   Magnesium 100 MG CAPS Take 1 capsule (100 mg total) by mouth daily. 30 capsule 1   metoprolol tartrate (LOPRESSOR) 100 MG tablet Take 1 tablet (100 mg total) by mouth 2 (two) times daily. 180 tablet 3   mometasone (ELOCON) 0.1 % cream Apply 1 Application  topically daily. (Patient not taking: Reported on 03/02/2022) 90 g 1   Multiple Vitamin (MULTIVITAMIN) tablet Take 1 tablet by mouth daily.     nitroGLYCERIN (NITROSTAT) 0.4 MG SL tablet Place 1 tablet (0.4 mg total) under the tongue every 5 (five) minutes as needed. 25 tablet 11   oxycodone (OXY-IR) 5 MG capsule Take  5 mg by mouth 3 (three) times daily as needed for pain.     pantoprazole (PROTONIX) 40 MG tablet TAKE 1 TABLET TWICE A DAY BEFORE MEALS (Patient taking differently: Take 40 mg by mouth 2 (two) times daily before a meal.) 180 tablet 3   Pitavastatin Calcium (LIVALO) 2 MG TABS Take 1 tablet (2 mg total) by mouth daily. 90 tablet 3   polyethylene glycol (MIRALAX / GLYCOLAX) 17 g packet Take 17 g by mouth daily as needed for moderate constipation. 14 each 0   sucralfate (CARAFATE) 1 GM/10ML suspension Take 10 mLs (1 g total) by mouth 4 (four) times daily -  with meals and at bedtime for 28 days. 1120 mL 0   No current facility-administered medications for this visit.    Allergies as of 03/10/2022 - Review Complete 03/02/2022  Allergen Reaction Noted   Bee venom Anaphylaxis 09/10/2010   Lipitor [atorvastatin] Other (See Comments) 01/12/2012   Morphine Other (See Comments) 07/14/2018   Metformin and related Other (See Comments) 10/01/2020   Lioresal [baclofen] Other (See Comments) 09/23/2020   Medrol [methylprednisolone] Other (See Comments) 09/23/2020   Zocor [simvastatin] Other (See Comments) 09/23/2020   Hydrocodone Itching 07/13/2016   Latex Rash 02/05/2022   Pravachol [pravastatin] Other (See Comments) 12/15/2015    Family History  Problem Relation Age of Onset   Diabetes Mother    Hypertension Mother    Stroke Mother    Hyperlipidemia Mother    Hypertension Father    Colon polyps Father    Heart attack Father 94   Stroke Father    Heart attack Brother    Diabetes Brother    Heart disease Brother    Heart attack Brother        Multiple   Diabetes Brother     Diabetes Sister    Stroke Sister    Obesity Brother    Diabetes Brother    Heart disease Brother    Hypertension Maternal Grandmother    ADD / ADHD Daughter    Heart disease Brother    Stomach cancer Neg Hx    Colon cancer Neg Hx    Esophageal cancer Neg Hx    Rectal cancer Neg Hx     Social History   Socioeconomic History   Marital status: Married    Spouse name: Not on file   Number of children: 2   Years of education: Not on file   Highest education level: Not on file  Occupational History   Occupation: TEACHER    Employer: [employer]  Tobacco Use   Smoking status: Former    Packs/day: 1.50    Years: 20.00    Total pack years: 30.00    Types: Cigarettes    Quit date: 04/21/1991    Years since quitting: 30.9   Smokeless tobacco: Never  Vaping Use   Vaping Use: Never used  Substance and Sexual Activity   Alcohol use: Yes    Alcohol/week: 3.0 standard drinks of alcohol    Types: 3 Cans of beer per week    Comment: weekly   Drug use: No   Sexual activity: Not Currently  Other Topics Concern   Not on file  Social History Narrative   Lives with wife in [place].  Does not routinely exercise.   Social Determinants of Health   Financial Resource Strain: Low Risk  (12/01/2021)   Overall Financial Resource Strain (CARDIA)    Difficulty of Paying Living Expenses: Not hard at all  Food Insecurity: No Food Insecurity (02/06/2022)   Hunger Vital Sign    Worried About Running Out of Food in the Last Year: Never true    Ran Out of Food in the Last Year: Never true  Transportation Needs: No Transportation Needs (02/06/2022)   PRAPARE - Hydrologist (Medical): No    Lack of Transportation (Non-Medical): No  Physical Activity: Inactive (12/01/2021)   Exercise Vital Sign    Days of Exercise per Week: 0 days    Minutes of Exercise per Session: 0 min  Stress: No Stress Concern Present (12/01/2021)   [place]    Feeling of Stress : Not at all  Social Connections: [place] (12/01/2021)   Social Connection and Isolation Panel [NHANES]    Frequency of Communication with Friends and Family: More than three times a week    Frequency of Social Gatherings with Friends and Family: More than three times a week    Attends Religious Services: More than 4 times per year    Active Member of [employer] or Organizations: Yes    Attends Music therapist: More than 4 times per year    Marital Status: Married  Human resources officer Violence: Not At Risk (02/06/2022)   Humiliation, Afraid, Rape, and Kick questionnaire    Fear of Current or Ex-Partner: No    Emotionally Abused: No    Physically Abused: No    Sexually Abused: No    Review of Systems:    Constitutional: No weight loss, fever or chills Cardiovascular: No chest pain  Respiratory: No SOB  Gastrointestinal: See HPI and otherwise negative   Physical Exam:  Vital signs: BP (!) 148/72   Pulse 85   Ht '5\' 8"'$  (1.727 m)   Wt 260 lb (117.9 kg)   BMI 39.53 kg/m    Constitutional:   Pleasant overweight Caucasian male appears to be in NAD, Well developed, Well nourished, alert and cooperative Respiratory: Respirations even and unlabored. Lungs clear to auscultation bilaterally.   No wheezes, crackles, or rhonchi.  Cardiovascular: Normal S1, S2. No MRG. Regular rate and rhythm. No peripheral edema, cyanosis or pallor.  Gastrointestinal:  Soft, nondistended, nontender. No rebound or guarding. Normal bowel sounds. No appreciable masses or hepatomegaly. Rectal:  Not performed.  Psychiatric: Oriented to person, place and time. Demonstrates good judgement and reason without abnormal affect or behaviors.  RELEVANT LABS AND IMAGING: CBC    Component Value Date/Time   WBC 5.1 03/02/2022 1248   WBC 4.8 02/17/2022 1100   RBC 3.49 (L) 03/02/2022 1248   RBC 3.49 (L) 03/02/2022 1248   HGB 10.4 (L)  03/02/2022 1248   HGB 13.8 11/24/2021 1125   HCT 33.2 (L) 03/02/2022 1248   HCT 41.3 11/24/2021 1125   PLT 184 03/02/2022 1248   PLT 242 11/24/2021 1125   MCV 95.1 03/02/2022 1248   MCV 89 11/24/2021 1125   MCH 29.8 03/02/2022 1248   MCHC 31.3 03/02/2022 1248   RDW 13.8 03/02/2022 1248   RDW 12.6 11/24/2021 1125   LYMPHSABS 1.1 03/02/2022 1248   MONOABS 0.4 03/02/2022 1248   EOSABS 0.2 03/02/2022 1248   BASOSABS 0.0 03/02/2022 1248    CMP     Component Value Date/Time   NA 139 02/17/2022 1100   NA 139 12/08/2021 1121   K 4.7 02/17/2022 1100   CL 106 02/17/2022 1100   CO2 27 02/17/2022 1100   GLUCOSE 129 (  H) 02/17/2022 1100   BUN 18 02/17/2022 1100   BUN 21 12/08/2021 1121   CREATININE 1.21 02/17/2022 1100   CREATININE 1.19 11/18/2021 1131   CREATININE 1.19 01/11/2020 1543   CALCIUM 9.0 02/17/2022 1100   PROT 5.8 (L) 02/17/2022 1100   ALBUMIN 4.1 02/17/2022 1100   AST 20 02/17/2022 1100   AST 21 11/18/2021 1131   ALT 14 02/17/2022 1100   ALT 15 11/18/2021 1131   ALKPHOS 31 (L) 02/17/2022 1100   BILITOT 0.5 02/17/2022 1100   BILITOT 0.7 11/18/2021 1131   GFRNONAA >60 02/07/2022 0258   GFRNONAA >60 11/18/2021 1131   GFRAA 89 07/18/2019 1454   GFRAA >60 07/14/2018 1021    Assessment: 1.  History of marginal ulcer: Recent hospitalization with melena and hemoglobin down to 5, now improved up to 10, EGD at the time with anastomotic ulcer likely from increased aspirin/dual antiplatelet therapy after recent stenting in August, now no further bleeding and doing well on a twice daily PPI 2.  Iron deficiency anemia: Related to above  Plan: 1.  Patient will need repeat EGD to ensure ulcer healing, will discuss timing further with Dr. Henrene Pastor and send cardiac clearance/anticoagulation clearance to cardiology. 2.  Patient to continue Pantoprazole 40 mg twice daily for now.  Tells me he has enough of this medication at home. 3.  Patient to continue to follow with hematology in  regards to anemia. 4.  Patient following in clinic per our recommendations.  Ellouise Newer, PA-C [facility] 03/10/2022, 10:48 AM  Cc: [name], MD

## 2022-03-10 NOTE — Telephone Encounter (Signed)
Preoperative team, patient had cardiac catheterization with PCI and stent placement in August 2023.  Uninterrupted dual antiplatelet therapy was recommended for at least 6 months.  Patient will be eligible to hold antiplatelet therapy until after February 2023.  Please contact requesting office and surgeon and let them know that his request will need to be resubmitted in 3 months.  Thank you for your help.  Jossie Ng. Sritha Chauncey NP-C     03/10/2022, 11:51 AM [place] [address] Office [phone] Fax [phone]

## 2022-03-10 NOTE — Patient Instructions (Signed)
Continue Pantoprazole 40 mg twice daily 30-60 minutes before breakfast and dinner.   _______________________________________________________  If you are age 66 or older, your body mass index should be between 23-30. Your Body mass index is 39.53 kg/m. If this is out of the aforementioned range listed, please consider follow up with your Primary Care Provider.  If you are age 81 or younger, your body mass index should be between 19-25. Your Body mass index is 39.53 kg/m. If this is out of the aformentioned range listed, please consider follow up with your Primary Care Provider.   ________________________________________________________  The [facility] providers would like to encourage you to use [facility] to communicate with providers for non-urgent requests or questions.  Due to long hold times on the telephone, sending your provider a message by [facility] may be a faster and more efficient way to get a response.  Please allow 48 business hours for a response.  Please remember that this is for non-urgent requests.  _______________________________________________________

## 2022-03-10 NOTE — Progress Notes (Signed)
JLL, Reviewed. Follow up EGD in about 4-6 weeks from now. OK to stay ON Plavix for the procedure. Thanks, JP

## 2022-03-11 ENCOUNTER — Telehealth: Payer: Self-pay

## 2022-03-11 DIAGNOSIS — K289 Gastrojejunal ulcer, unspecified as acute or chronic, without hemorrhage or perforation: Secondary | ICD-10-CM

## 2022-03-11 DIAGNOSIS — D509 Iron deficiency anemia, unspecified: Secondary | ICD-10-CM

## 2022-03-11 NOTE — Telephone Encounter (Signed)
Called and spoke with patient regarding recommendations. Patient has been scheduled for an EGD in the [place] on Thursday, 04/23/22 at 10 am. Pt is aware that he will need to arrive in the [facility] by 9 am with a care partner. Pt has been advised that he can remain on Plavix for this procedure. Pt knows that I will send his instructions via MyChart and a hard copy will be placed in the mail. Pt verbalized understanding of all information and had no concerns at the end of the call.  Ambulatory referral to GI in epic. EGD instructions mailed and sent to patient via [place].

## 2022-03-11 NOTE — Telephone Encounter (Signed)
-----   Message from [place], [place] sent at 03/10/2022  1:25 PM EST ----- Regarding: repeat EGD Please set up repeat EGD in [place] with Dr. Henrene Pastor in 4-6 weeks. Patient can stay on Plavix. Thanks-JLL ----- Message ----- From: Irene Shipper, MD Sent: 03/10/2022  12:03 PM EST To: Levin Erp, PA     ----- Message ----- From: Levin Erp, [place] Sent: 03/10/2022  11:15 AM EST To: Irene Shipper, MD

## 2022-03-11 NOTE — Telephone Encounter (Signed)
FYI

## 2022-03-20 ENCOUNTER — Inpatient Hospital Stay: Payer: Medicare Other | Attending: Hematology & Oncology

## 2022-03-20 VITALS — BP 136/78 | HR 75 | Temp 97.8°F | Resp 17

## 2022-03-20 DIAGNOSIS — D509 Iron deficiency anemia, unspecified: Secondary | ICD-10-CM | POA: Insufficient documentation

## 2022-03-20 DIAGNOSIS — K909 Intestinal malabsorption, unspecified: Secondary | ICD-10-CM

## 2022-03-20 DIAGNOSIS — Z79899 Other long term (current) drug therapy: Secondary | ICD-10-CM | POA: Diagnosis not present

## 2022-03-20 DIAGNOSIS — D500 Iron deficiency anemia secondary to blood loss (chronic): Secondary | ICD-10-CM

## 2022-03-20 MED ORDER — SODIUM CHLORIDE 0.9 % IV SOLN: Freq: Once | INTRAVENOUS | Status: AC

## 2022-03-20 MED ORDER — SODIUM CHLORIDE 0.9 % IV SOLN
300.0000 mg | Freq: Once | INTRAVENOUS | Status: AC
  Administered 2022-03-20: 300 mg via INTRAVENOUS
  Filled 2022-03-20: qty 300

## 2022-03-20 NOTE — Patient Instructions (Signed)

## 2022-03-23 ENCOUNTER — Ambulatory Visit: Payer: Medicare Other | Attending: Cardiology

## 2022-03-23 DIAGNOSIS — E782 Mixed hyperlipidemia: Secondary | ICD-10-CM | POA: Diagnosis not present

## 2022-03-23 DIAGNOSIS — I2510 Atherosclerotic heart disease of native coronary artery without angina pectoris: Secondary | ICD-10-CM | POA: Diagnosis not present

## 2022-03-24 LAB — HEPATIC FUNCTION PANEL
ALT: 16 IU/L (ref 0–44)
AST: 29 IU/L (ref 0–40)
Albumin: 4.8 g/dL (ref 3.9–4.9)
Alkaline Phosphatase: 39 IU/L — ABNORMAL LOW (ref 44–121)
Bilirubin Total: 0.3 mg/dL (ref 0.0–1.2)
Bilirubin, Direct: 0.16 mg/dL (ref 0.00–0.40)
Total Protein: 6.6 g/dL (ref 6.0–8.5)

## 2022-03-24 LAB — LIPID PANEL
Chol/HDL Ratio: 2.3 ratio (ref 0.0–5.0)
Cholesterol, Total: 102 mg/dL (ref 100–199)
HDL: 44 mg/dL (ref >39)
LDL Chol Calc (NIH): 39 mg/dL (ref 0–99)
Triglycerides: 102 mg/dL (ref 0–149)
VLDL Cholesterol Cal: 19 mg/dL (ref 5–40)

## 2022-03-24 LAB — APOLIPOPROTEIN B: Apolipoprotein B: 50 mg/dL (ref <90)

## 2022-03-27 ENCOUNTER — Inpatient Hospital Stay: Payer: Medicare Other

## 2022-03-27 VITALS — BP 107/87 | HR 74 | Temp 98.5°F | Resp 18

## 2022-03-27 DIAGNOSIS — D509 Iron deficiency anemia, unspecified: Secondary | ICD-10-CM | POA: Diagnosis not present

## 2022-03-27 DIAGNOSIS — D500 Iron deficiency anemia secondary to blood loss (chronic): Secondary | ICD-10-CM

## 2022-03-27 DIAGNOSIS — Z79899 Other long term (current) drug therapy: Secondary | ICD-10-CM | POA: Diagnosis not present

## 2022-03-27 DIAGNOSIS — K909 Intestinal malabsorption, unspecified: Secondary | ICD-10-CM

## 2022-03-27 MED ORDER — SODIUM CHLORIDE 0.9% FLUSH: 3.0000 mL | Freq: Once | INTRAVENOUS | Status: DC | PRN

## 2022-03-27 MED ORDER — SODIUM CHLORIDE 0.9 % IV SOLN: Freq: Once | INTRAVENOUS | Status: AC

## 2022-03-27 MED ORDER — SODIUM CHLORIDE 0.9% FLUSH: 10.0000 mL | Freq: Once | INTRAVENOUS | Status: DC | PRN

## 2022-03-27 MED ORDER — SODIUM CHLORIDE 0.9 % IV SOLN
300.0000 mg | Freq: Once | INTRAVENOUS | Status: AC
  Administered 2022-03-27: 300 mg via INTRAVENOUS
  Filled 2022-03-27: qty 300

## 2022-03-27 NOTE — Patient Instructions (Signed)

## 2022-04-03 ENCOUNTER — Ambulatory Visit: Payer: Medicare Other

## 2022-04-03 ENCOUNTER — Inpatient Hospital Stay: Payer: Medicare Other

## 2022-04-03 VITALS — BP 126/70 | HR 62 | Temp 97.6°F | Resp 17

## 2022-04-03 DIAGNOSIS — D500 Iron deficiency anemia secondary to blood loss (chronic): Secondary | ICD-10-CM

## 2022-04-03 DIAGNOSIS — Z79899 Other long term (current) drug therapy: Secondary | ICD-10-CM | POA: Diagnosis not present

## 2022-04-03 DIAGNOSIS — D509 Iron deficiency anemia, unspecified: Secondary | ICD-10-CM | POA: Diagnosis not present

## 2022-04-03 DIAGNOSIS — K909 Intestinal malabsorption, unspecified: Secondary | ICD-10-CM

## 2022-04-03 MED ORDER — SODIUM CHLORIDE 0.9 % IV SOLN: Freq: Once | INTRAVENOUS | Status: AC

## 2022-04-03 MED ORDER — SODIUM CHLORIDE 0.9% FLUSH: 10.0000 mL | Freq: Once | INTRAVENOUS | Status: DC | PRN

## 2022-04-03 MED ORDER — SODIUM CHLORIDE 0.9 % IV SOLN
300.0000 mg | Freq: Once | INTRAVENOUS | Status: AC
  Administered 2022-04-03: 300 mg via INTRAVENOUS
  Filled 2022-04-03: qty 300

## 2022-04-03 NOTE — Patient Instructions (Signed)

## 2022-04-06 DIAGNOSIS — M5030 Other cervical disc degeneration, unspecified cervical region: Secondary | ICD-10-CM | POA: Diagnosis not present

## 2022-04-06 DIAGNOSIS — M5412 Radiculopathy, cervical region: Secondary | ICD-10-CM | POA: Diagnosis not present

## 2022-04-06 DIAGNOSIS — M546 Pain in thoracic spine: Secondary | ICD-10-CM | POA: Diagnosis not present

## 2022-04-06 DIAGNOSIS — M545 Low back pain, unspecified: Secondary | ICD-10-CM | POA: Diagnosis not present

## 2022-04-06 DIAGNOSIS — M542 Cervicalgia: Secondary | ICD-10-CM | POA: Diagnosis not present

## 2022-04-06 DIAGNOSIS — M5136 Other intervertebral disc degeneration, lumbar region: Secondary | ICD-10-CM | POA: Diagnosis not present

## 2022-04-06 DIAGNOSIS — Z5181 Encounter for therapeutic drug level monitoring: Secondary | ICD-10-CM | POA: Diagnosis not present

## 2022-04-08 ENCOUNTER — Other Ambulatory Visit: Payer: Self-pay | Admitting: Nurse Practitioner

## 2022-04-08 DIAGNOSIS — R0609 Other forms of dyspnea: Secondary | ICD-10-CM

## 2022-04-08 DIAGNOSIS — R0989 Other specified symptoms and signs involving the circulatory and respiratory systems: Secondary | ICD-10-CM

## 2022-04-21 DIAGNOSIS — Z9884 Bariatric surgery status: Secondary | ICD-10-CM | POA: Diagnosis not present

## 2022-04-21 DIAGNOSIS — E1165 Type 2 diabetes mellitus with hyperglycemia: Secondary | ICD-10-CM | POA: Diagnosis not present

## 2022-04-21 DIAGNOSIS — Z7985 Long-term (current) use of injectable non-insulin antidiabetic drugs: Secondary | ICD-10-CM | POA: Diagnosis not present

## 2022-04-23 ENCOUNTER — Encounter: Payer: Self-pay | Admitting: Internal Medicine

## 2022-04-23 ENCOUNTER — Ambulatory Visit (AMBULATORY_SURGERY_CENTER): Payer: Medicare Other | Admitting: Internal Medicine

## 2022-04-23 VITALS — BP 137/83 | HR 81 | Temp 97.3°F | Resp 17 | Ht 68.0 in | Wt 260.0 lb

## 2022-04-23 DIAGNOSIS — I10 Essential (primary) hypertension: Secondary | ICD-10-CM | POA: Diagnosis not present

## 2022-04-23 DIAGNOSIS — K219 Gastro-esophageal reflux disease without esophagitis: Secondary | ICD-10-CM

## 2022-04-23 DIAGNOSIS — K289 Gastrojejunal ulcer, unspecified as acute or chronic, without hemorrhage or perforation: Secondary | ICD-10-CM

## 2022-04-23 DIAGNOSIS — Z8719 Personal history of other diseases of the digestive system: Secondary | ICD-10-CM

## 2022-04-23 MED ORDER — SODIUM CHLORIDE 0.9 % IV SOLN: 500.0000 mL | INTRAVENOUS | Status: DC

## 2022-04-23 NOTE — Patient Instructions (Signed)
Please read handouts provided. Continue present medications. Continue pantoprazole twice daily indefinitely. Please take a daily iron supplement.   YOU HAD AN ENDOSCOPIC PROCEDURE TODAY AT [facility]:   Refer to the procedure report that was given to you for any specific questions about what was found during the examination.  If the procedure report does not answer your questions, please call your gastroenterologist to clarify.  If you requested that your care partner not be given the details of your procedure findings, then the procedure report has been included in a sealed envelope for you to review at your convenience later.  YOU SHOULD EXPECT: Some feelings of bloating in the abdomen. Passage of more gas than usual.  Walking can help get rid of the air that was put into your GI tract during the procedure and reduce the bloating. If you had a lower endoscopy (such as a colonoscopy or flexible sigmoidoscopy) you may notice spotting of blood in your stool or on the toilet paper. If you underwent a bowel prep for your procedure, you may not have a normal bowel movement for a few days.  Please Note:  You might notice some irritation and congestion in your nose or some drainage.  This is from the oxygen used during your procedure.  There is no need for concern and it should clear up in a day or so.  SYMPTOMS TO REPORT IMMEDIATELY:   Following upper endoscopy (EGD)  Vomiting of blood or coffee ground material  New chest pain or pain under the shoulder blades  Painful or persistently difficult swallowing  New shortness of breath  Fever of 100F or higher  Black, tarry-looking stools  For urgent or emergent issues, a gastroenterologist can be reached at any hour by calling [phone]. Do not use MyChart messaging for urgent concerns.    DIET:  We do recommend a small meal at first, but then you may proceed to your regular diet.  Drink plenty of fluids but you should avoid  alcoholic beverages for 24 hours.  ACTIVITY:  You should plan to take it easy for the rest of today and you should NOT DRIVE or use heavy machinery until tomorrow (because of the sedation medicines used during the test).    FOLLOW UP: Our staff will call the number listed on your records the next business day following your procedure.  We will call around 7:15- 8:00 am to check on you and address any questions or concerns that you may have regarding the information given to you following your procedure. If we do not reach you, we will leave a message.     If any biopsies were taken you will be contacted by phone or by letter within the next 1-3 weeks.  Please call us at [phone] if you have not heard about the biopsies in 3 weeks.    SIGNATURES/CONFIDENTIALITY: You and/or your care partner have signed paperwork which will be entered into your electronic medical record.  These signatures attest to the fact that that the information above on your After Visit Summary has been reviewed and is understood.  Full responsibility of the confidentiality of this discharge information lies with you and/or your care-partner.

## 2022-04-23 NOTE — Progress Notes (Signed)
Vital signs checked by:AS  The medical and surgical history was reviewed and verified with the patient.  

## 2022-04-23 NOTE — Progress Notes (Signed)
Pt resting comfortably. VSS. Airway intact. SBAR complete to RN. All questions answered.   

## 2022-04-23 NOTE — Progress Notes (Signed)
Chief Complaint: Follow up hospitalization for GI Bleed   HPI:  Brent [name] is a  67 y/o Caucasian male, known to Dr. Henrene Pastor, with a past medical history as listed below including Roux-en-Y gastric bypass, OSA, CAD status post recent stent on RCA 8/23 with dual antiplatelet therapy (12/08/2021 echo with normal LVEF 55-60%), who presents to clinic today for follow-up after recent hospitalization for GI bleed.      02/05/2022 patient seen in consultation by our service for melena in the hospital.  At the time patient presented hemoglobin 5.7 status post 2 units PRBCs up to 7.4.  At that time Plavix and aspirin were on hold patient started on a twice daily PPI and scheduled for EGD.    02/05/2022 EGD with Roux-en-Y gastrojejunostomy with gastrojejunal anastomosis characterized by marginal ulceration clipped and injected with epinephrine.  Otherwise normal.  Patient told to continue Pantoprazole 40 p.o. twice daily x 8 weeks and Carafate 1 g p.o. 4 times daily for 4 weeks.  Plavix was restarted.  Recommended repeat EGD at some point but due to recent PCI this would need to be discussed at follow-up.    03/02/2022 CBC with a hemoglobin of 10.4 (8.8 02/17/2022).  Ferritin 39.  Iron low at 42% saturation low at 10.  Patient follow-up with hematology oncology on that day for his iron deficiency anemia and was planned to have iron infusions.    Today, the patient tells me that he is doing well.  No problems at all.  Following with hematology/ oncology for his continual blood counts and iron infusions if he needs them.  Still on Pantoprazole 40 mg twice a day and not having any problems.    Denies fever, chills, weight loss, abdominal pain or melena.   GI history: 09/01/2021 EGD: Showed status post Roux-en-Y gastric bypass and otherwise normal 09/01/2021 colonoscopy: Two 2-3 mm polyps in the sigmoid colon and cecum, pathology showed adenoma and repeat recommended in 5 years 07/17/2021 office visit with Brent Bogus,  PA-C: That visit discussed hepatomegaly some mild anemia and iron deficiency from time to time at least part and due to absorption issues from his gastric bypass; recommended colonoscopy with Dr. Henrene Pastor, continued on Pantoprazole 40 twice daily and Pepcid for reflux and history of marginal ulcer 07/2016 colonoscopy: 2 mm polyp removed, internal hemorrhoids, pathology showed tubular adenoma and recall placed for 5 years 08/2018 EGD: Follow-up from an endoscopy in March 2020, found to have anatomy consistent with Roux-en-Y gastric bypass and had confirmed a healed marginal ulcer that was seen in March 2020       Past Medical History:  Diagnosis Date   Acid reflux disease     ACID REFLUX DISEASE 07/05/2007   Acute gastric ulcer with bleeding     Alcohol abuse     Anemia     Atherosclerosis     Benign neoplasm of colon 07/05/2007   Bilateral hip pain 10/05/2016   Bladder cancer ([place])     Breast pain, left 11/17/2011   CAD (coronary artery disease)     Chest pain      a. Reportedly negative dobut echo performed prior to gastric bypass in 03/2011;  b. 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 12/2011 Cath: LM nl, LAD 50p, D1 84m LCX min irregs, OM3 30, RCA 25p, 758mFFR 0.99->0.89), PDA 30, EF 65%, Med Rx. d. Cath 11/2021 - 90% m RCA s/p PCI/DES x1   COLONIC POLYPS, HX OF 10/29/2008   Diarrhea 06/13/2010  Diverticulosis 07/05/2018   DIVERTICULOSIS, COLON 10/29/2008   DM (diabetes mellitus), type 2, uncontrolled     GI bleed     Gout 03/04/2013   Hearing loss 05/24/2013    Previous audiology evaluation completely.   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   Neck pain 03/2015   Other and unspecified hyperlipidemia 11/16/2012    Post-operative nausea and vomiting      after the surgery in hospital in March 2020/ past the cauderization   Preventative health care 11/17/2011   Renal stone 11/2013   Sleep apnea      a. CPAP   Spinal stenosis     Tear of meniscus of left knee 2012           Past Surgical History:  Procedure Laterality Date   ANKLE SURGERY Right 1994   BICEPS TENDON REPAIR        left side   CARDIAC CATHETERIZATION        denies any chest pain in the past 2 years   COLONOSCOPY       colonoscopy polyps       CORONARY STENT INTERVENTION N/A 11/28/2021    Procedure: CORONARY STENT INTERVENTION;  Surgeon: Martinique, Peter M, MD;  Location: [facility];  Service: Cardiovascular;  Laterality: N/A;   ESOPHAGOGASTRODUODENOSCOPY N/A 03/27/2013    Procedure: ESOPHAGOGASTRODUODENOSCOPY (EGD);  Surgeon: Irene Shipper, MD;  Location: [facility];  Service: Endoscopy;  Laterality: N/A;   ESOPHAGOGASTRODUODENOSCOPY (EGD) WITH PROPOFOL N/A 07/06/2018    Procedure: ESOPHAGOGASTRODUODENOSCOPY (EGD) WITH PROPOFOL;  Surgeon: Jerene Bears, MD;  Location: [facility];  Service: Gastroenterology;  Laterality: N/A;   ESOPHAGOGASTRODUODENOSCOPY (EGD) WITH PROPOFOL N/A 02/05/2022    Procedure: ESOPHAGOGASTRODUODENOSCOPY (EGD) WITH PROPOFOL;  Surgeon: Lavena Bullion, DO;  Location: [facility];  Service: Gastroenterology;  Laterality: N/A;   GASTRIC BYPASS       HEMOSTASIS CLIP PLACEMENT   02/05/2022    Procedure: HEMOSTASIS CLIP PLACEMENT;  Surgeon: Lavena Bullion, DO;  Location: [facility];  Service: Gastroenterology;;   HERNIA REPAIR       HOT HEMOSTASIS N/A 07/06/2018    Procedure: HOT HEMOSTASIS (ARGON PLASMA COAGULATION/BICAP);  Surgeon: Jerene Bears, MD;  Location: [facility];  Service: Gastroenterology;  Laterality: N/A;   KNEE ARTHROSCOPY Left 11/06/2010    Left, torn meniscus (repaired)   LEFT HEART CATH AND CORONARY ANGIOGRAPHY N/A 11/28/2021    Procedure: LEFT HEART CATH AND CORONARY  ANGIOGRAPHY;  Surgeon: Martinique, Peter M, MD;  Location: [facility];  Service: Cardiovascular;  Laterality: N/A;   LEFT HEART CATHETERIZATION WITH CORONARY ANGIOGRAM N/A 12/23/2011    Procedure: LEFT HEART CATHETERIZATION WITH CORONARY ANGIOGRAM;  Surgeon: Minus Breeding, MD;  Location: [facility];  Service: Cardiovascular;  Laterality: N/A;   REPLACEMENT TOTAL KNEE Right 01/2016    removed scar tissue/ cut tip of nerve bundle and re-route   right knee arthroscopy Right 07/05/14    Dr. Hart Robinsons, [place].   ROTATOR CUFF REPAIR   2019    left   SCHLEROTHERAPY   07/06/2018    Procedure: SCHLEROTHERAPY;  Surgeon: Jerene Bears, MD;  Location: [facility];  Service: Gastroenterology;;  SCLEROTHERAPY   02/05/2022    Procedure: SCLEROTHERAPY;  Surgeon: Lavena Bullion, DO;  Location: [facility];  Service: Gastroenterology;;   TONSILLECTOMY   age 2   TONSILLECTOMY        as a child   TOTAL KNEE ARTHROPLASTY Right 01/20/2016    Procedure: RIGHT TOTAL KNEE ARTHROPLASTY;  Surgeon: Paralee Cancel, MD;  Location: [facility];  Service: Orthopedics;  Laterality: Right;   TRANSURETHRAL RESECTION OF BLADDER TUMOR N/A 09/24/2020    Procedure: TRANSURETHRAL RESECTION OF BLADDER TUMOR (TURBT)  RIGHT retrograde pylegram;  Surgeon: [name], MD;  Location: [facility];  Service: Urology;  Laterality: N/A;   UPPER GI ENDOSCOPY   03/27/13            Current Outpatient Medications  Medication Sig Dispense Refill   acetaminophen (TYLENOL) 500 MG tablet Take 1,000 mg by mouth every 6 (six) hours as needed for moderate pain or headache.       amitriptyline (ELAVIL) 50 MG tablet TAKE 1 TABLET AT BEDTIME (Patient taking differently: Take 50 mg by mouth at bedtime.) 90 tablet 3   amoxicillin (AMOXIL) 500 MG capsule Take 2,000 mg by mouth See admin instructions. Take 4 capsules (2,000 mg) prior to dental appointments (Patient not taking: Reported on 02/09/2022)       Cholecalciferol (VITAMIN D) 50 MCG (2000  UT) tablet Take 6,000 Units by mouth daily.       clopidogrel (PLAVIX) 75 MG tablet Take 1 tablet (75 mg total) by mouth daily with breakfast. 90 tablet 3   Cyanocobalamin (VITAMIN B-12) 1000 MCG SUBL Place 1,000 mcg under the tongue 2 (two) times a week.       Dulaglutide (TRULICITY) 1.5 VO/5.3GU SOPN Inject 3 mg into the skin once a week. Saturday       EPINEPHrine 0.3 mg/0.3 mL IJ SOAJ injection INJECT 0.3 MLS (0.3 MG TOTAL) INTO THE MUSCLE ONCE FOR 1 DOSE (Patient not taking: Reported on 03/02/2022) 2 each 11   ezetimibe (ZETIA) 10 MG tablet Take 1 tablet (10 mg total) by mouth daily. 90 tablet 3   famotidine (PEPCID) 40 MG tablet TAKE 1 TABLET AT BEDTIME AS NEEDED FOR HEARTBURN OR INDIGESTION (Patient not taking: Reported on 03/02/2022) 90 tablet 3   fenofibrate 160 MG tablet Take 1 tablet (160 mg total) by mouth daily. 90 tablet 1   Ferrous Gluconate-C-Folic Acid (IRON-C PO) Take 1 tablet by mouth daily. Iron 65 mg and vit C-125 mg       fluticasone (FLONASE) 50 MCG/ACT nasal spray USE 2 SPRAYS IN EACH NOSTRIL DAILY 48 g 3   gabapentin (NEURONTIN) 300 MG capsule TAKE 1 CAPSULE TWICE A DAY AND 2 CAPSULES AT BEDTIME (Patient taking differently: Take 300-900 mg by mouth See admin instructions. Take 1 capsule (300 mg) by mouth in the morning and at noon then take 3 capsules (900 mg) in the afternoon and at bedtime) 360 capsule 1   hydrochlorothiazide (MICROZIDE) 12.5 MG capsule Take 1 capsule (12.5 mg total) by mouth daily. 90 capsule 1   isosorbide mononitrate (IMDUR) 30 MG 24 hr tablet Take 2 tablets (60 mg total) by mouth daily. 180 tablet 3   losartan (COZAAR) 50 MG tablet TAKE 1 TABLET DAILY (KEEP UPCOMING APPOINTMENT IN MARCH 2023 WITH CARDIOLOGIST BEFORE ANYMORE REFILLS) (Patient taking differently: Take 50 mg by mouth daily.) 90 tablet 3   Magnesium 100 MG CAPS Take 1 capsule (100 mg total) by mouth daily. 30 capsule 1   metoprolol tartrate (  LOPRESSOR) 100 MG tablet Take 1 tablet (100 mg  total) by mouth 2 (two) times daily. 180 tablet 3   mometasone (ELOCON) 0.1 % cream Apply 1 Application topically daily. (Patient not taking: Reported on 03/02/2022) 90 g 1   Multiple Vitamin (MULTIVITAMIN) tablet Take 1 tablet by mouth daily.       nitroGLYCERIN (NITROSTAT) 0.4 MG SL tablet Place 1 tablet (0.4 mg total) under the tongue every 5 (five) minutes as needed. 25 tablet 11   oxycodone (OXY-IR) 5 MG capsule Take 5 mg by mouth 3 (three) times daily as needed for pain.       pantoprazole (PROTONIX) 40 MG tablet TAKE 1 TABLET TWICE A DAY BEFORE MEALS (Patient taking differently: Take 40 mg by mouth 2 (two) times daily before a meal.) 180 tablet 3   Pitavastatin Calcium (LIVALO) 2 MG TABS Take 1 tablet (2 mg total) by mouth daily. 90 tablet 3   polyethylene glycol (MIRALAX / GLYCOLAX) 17 g packet Take 17 g by mouth daily as needed for moderate constipation. 14 each 0   sucralfate (CARAFATE) 1 GM/10ML suspension Take 10 mLs (1 g total) by mouth 4 (four) times daily -  with meals and at bedtime for 28 days. 1120 mL 0    No current facility-administered medications for this visit.           Allergies as of 03/10/2022 - Review Complete 03/02/2022  Allergen Reaction Noted   Bee venom Anaphylaxis 09/10/2010   Lipitor [atorvastatin] Other (See Comments) 01/12/2012   Morphine Other (See Comments) 07/14/2018   Metformin and related Other (See Comments) 10/01/2020   Lioresal [baclofen] Other (See Comments) 09/23/2020   Medrol [methylprednisolone] Other (See Comments) 09/23/2020   Zocor [simvastatin] Other (See Comments) 09/23/2020   Hydrocodone Itching 07/13/2016   Latex Rash 02/05/2022   Pravachol [pravastatin] Other (See Comments) 12/15/2015           Family History  Problem Relation Age of Onset   Diabetes Mother     Hypertension Mother     Stroke Mother     Hyperlipidemia Mother     Hypertension Father     Colon polyps Father     Heart attack Father 20   Stroke Father     Heart  attack Brother     Diabetes Brother     Heart disease Brother     Heart attack Brother          Multiple   Diabetes Brother     Diabetes Sister     Stroke Sister     Obesity Brother     Diabetes Brother     Heart disease Brother     Hypertension Maternal Grandmother     ADD / ADHD Daughter     Heart disease Brother     Stomach cancer Neg Hx     Colon cancer Neg Hx     Esophageal cancer Neg Hx     Rectal cancer Neg Hx        Social History         Socioeconomic History   Marital status: Married      Spouse name: Not on file   Number of children: 2   Years of education: Not on file   Highest education level: Not on file  Occupational History   Occupation: TEACHER      Employer: [employer]  Tobacco Use   Smoking status: Former      Packs/day: 1.50  Years: 20.00      Total pack years: 30.00      Types: Cigarettes      Quit date: 04/21/1991      Years since quitting: 30.9   Smokeless tobacco: Never  Vaping Use   Vaping Use: Never used  Substance and Sexual Activity   Alcohol use: Yes      Alcohol/week: 3.0 standard drinks of alcohol      Types: 3 Cans of beer per week      Comment: weekly   Drug use: No   Sexual activity: Not Currently  Other Topics Concern   Not on file  Social History Narrative    Lives with wife in [place].  Does not routinely exercise.    Social Determinants of Health        Financial Resource Strain: Low Risk  (12/01/2021)    Overall Financial Resource Strain (CARDIA)     Difficulty of Paying Living Expenses: Not hard at all  Food Insecurity: No Food Insecurity (02/06/2022)    Hunger Vital Sign     Worried About Running Out of Food in the Last Year: Never true     Ran Out of Food in the Last Year: Never true  Transportation Needs: No Transportation Needs (02/06/2022)    PRAPARE - Armed forces logistics/support/administrative officer (Medical): No     Lack of Transportation (Non-Medical): No  Physical Activity: Inactive  (12/01/2021)    Exercise Vital Sign     Days of Exercise per Week: 0 days     Minutes of Exercise per Session: 0 min  Stress: No Stress Concern Present (12/01/2021)    [place]     Feeling of Stress : Not at all  Social Connections: [place] (12/01/2021)    Social Connection and Isolation Panel [NHANES]     Frequency of Communication with Friends and Family: More than three times a week     Frequency of Social Gatherings with Friends and Family: More than three times a week     Attends Religious Services: More than 4 times per year     Active Member of [employer] or Organizations: Yes     Attends Music therapist: More than 4 times per year     Marital Status: Married  Human resources officer Violence: Not At Risk (02/06/2022)    Humiliation, Afraid, Rape, and Kick questionnaire     Fear of Current or Ex-Partner: No     Emotionally Abused: No     Physically Abused: No     Sexually Abused: No      Review of Systems:    Constitutional: No weight loss, fever or chills Cardiovascular: No chest pain  Respiratory: No SOB  Gastrointestinal: See HPI and otherwise negative    Physical Exam:  Vital signs: BP (!) 148/72   Pulse 85   Ht '5\' 8"'$  (1.727 m)   Wt 260 lb (117.9 kg)   BMI 39.53 kg/m     Constitutional:   Pleasant overweight Caucasian male appears to be in NAD, Well developed, Well nourished, alert and cooperative Respiratory: Respirations even and unlabored. Lungs clear to auscultation bilaterally.   No wheezes, crackles, or rhonchi.  Cardiovascular: Normal S1, S2. No MRG. Regular rate and rhythm. No peripheral edema, cyanosis or pallor.  Gastrointestinal:  Soft, nondistended, nontender. No rebound or guarding. Normal bowel sounds. No appreciable masses or hepatomegaly. Rectal:  Not performed.  Psychiatric: Oriented to  person, place and time. Demonstrates good judgement and reason without abnormal  affect or behaviors.   RELEVANT LABS AND IMAGING: CBC Labs (Brief)          Component Value Date/Time    WBC 5.1 03/02/2022 1248    WBC 4.8 02/17/2022 1100    RBC 3.49 (L) 03/02/2022 1248    RBC 3.49 (L) 03/02/2022 1248    HGB 10.4 (L) 03/02/2022 1248    HGB 13.8 11/24/2021 1125    HCT 33.2 (L) 03/02/2022 1248    HCT 41.3 11/24/2021 1125    PLT 184 03/02/2022 1248    PLT 242 11/24/2021 1125    MCV 95.1 03/02/2022 1248    MCV 89 11/24/2021 1125    MCH 29.8 03/02/2022 1248    MCHC 31.3 03/02/2022 1248    RDW 13.8 03/02/2022 1248    RDW 12.6 11/24/2021 1125    LYMPHSABS 1.1 03/02/2022 1248    MONOABS 0.4 03/02/2022 1248    EOSABS 0.2 03/02/2022 1248    BASOSABS 0.0 03/02/2022 1248        CMP     Labs (Brief)          Component Value Date/Time    NA 139 02/17/2022 1100    NA 139 12/08/2021 1121    K 4.7 02/17/2022 1100    CL 106 02/17/2022 1100    CO2 27 02/17/2022 1100    GLUCOSE 129 (H) 02/17/2022 1100    BUN 18 02/17/2022 1100    BUN 21 12/08/2021 1121    CREATININE 1.21 02/17/2022 1100    CREATININE 1.19 11/18/2021 1131    CREATININE 1.19 01/11/2020 1543    CALCIUM 9.0 02/17/2022 1100    PROT 5.8 (L) 02/17/2022 1100    ALBUMIN 4.1 02/17/2022 1100    AST 20 02/17/2022 1100    AST 21 11/18/2021 1131    ALT 14 02/17/2022 1100    ALT 15 11/18/2021 1131    ALKPHOS 31 (L) 02/17/2022 1100    BILITOT 0.5 02/17/2022 1100    BILITOT 0.7 11/18/2021 1131    GFRNONAA >60 02/07/2022 0258    GFRNONAA >60 11/18/2021 1131    GFRAA 89 07/18/2019 1454    GFRAA >60 07/14/2018 1021        Assessment: 1.  History of marginal ulcer: Recent hospitalization with melena and hemoglobin down to 5, now improved up to 10, EGD at the time with anastomotic ulcer likely from increased aspirin/dual antiplatelet therapy after recent stenting in August, now no further bleeding and doing well on a twice daily PPI 2.  Iron deficiency anemia: Related to above   Plan: 1.  Patient will  need repeat EGD to ensure ulcer healing, will discuss timing further with Dr. Henrene Pastor and send cardiac clearance/anticoagulation clearance to cardiology. 2.  Patient to continue Pantoprazole 40 mg twice daily for now.  Tells me he has enough of this medication at home. 3.  Patient to continue to follow with hematology in regards to anemia. 4.  Patient following in clinic per our recommendations.   Ellouise Newer, PA-C [facility] 03/10/2022, 10:48 AM

## 2022-04-23 NOTE — Op Note (Signed)
[place] Patient Name: Shun Pletz Procedure Date: 04/23/2022 9:34 AM MRN: [id number] Endoscopist: Docia Chuck. Henrene Pastor , MD, [id number] Age: 67 Referring MD:  Date of Birth: [birth year] Gender: Male Account #: [id number] Procedure:                Upper GI endoscopy Indications:              Follow-up of acute gastrojejunal ulcer.                            Hospitalized with bleeding marginal ulcer. Has been                            well since discharge. On twice daily PPI. Medicines:                Monitored Anesthesia Care Procedure:                Pre-Anesthesia Assessment:                           - Prior to the procedure, a History and Physical                            was performed, and patient medications and                            allergies were reviewed. The patient's tolerance of                            previous anesthesia was also reviewed. The risks                            and benefits of the procedure and the sedation                            options and risks were discussed with the patient.                            All questions were answered, and informed consent                            was obtained. Prior Anticoagulants: The patient has                            taken Plavix (clopidogrel), last dose was day of                            procedure. ASA Grade Assessment: III - A patient                            with severe systemic disease. After reviewing the                            risks and benefits, the patient was deemed in  satisfactory condition to undergo the procedure.                           After obtaining informed consent, the endoscope was                            passed under direct vision. Throughout the                            procedure, the patient's blood pressure, pulse, and                            oxygen saturations were monitored continuously. The                            GIF  [id number] #[id number] was introduced through the                            mouth, and advanced to the jejunum. The upper GI                            endoscopy was accomplished without difficulty. The                            patient tolerated the procedure well. Scope In: Scope Out: Findings:                 The esophagus was normal.                           The stomach revealed evidence of Roux-en-Y gastric                            bypass surgery. Unremarkable anatomy. Previous                            ulcers healed.                           The examined gastric pouch had small amount of                            retained contents.                           The post anastomotic jejunum was normal. Complications:            No immediate complications. Estimated Blood Loss:     Estimated blood loss: none. Impression:               1. Status post Roux-en-Y gastric bypass anatomy                           2. Healed ulcer. Recommendation:           - Patient has a contact number available for  emergencies. The signs and symptoms of potential                            delayed complications were discussed with the                            patient. Return to normal activities tomorrow.                            Written discharge instructions were provided to the                            patient.                           - Resume previous diet.                           - Continue present medications. Continue                            pantoprazole twice daily indefinitely                           -Please take a Daily iron supplement, if not already Docia Chuck. Henrene Pastor, MD 04/23/2022 9:48:55 AM This report has been signed electronically.

## 2022-04-24 ENCOUNTER — Telehealth: Payer: Self-pay

## 2022-04-24 NOTE — Telephone Encounter (Signed)
Follow up call to pt, no answer. 

## 2022-05-04 ENCOUNTER — Encounter: Payer: Self-pay | Admitting: Family Medicine

## 2022-05-04 ENCOUNTER — Inpatient Hospital Stay: Payer: Medicare Other | Attending: Hematology & Oncology

## 2022-05-04 DIAGNOSIS — D508 Other iron deficiency anemias: Secondary | ICD-10-CM | POA: Insufficient documentation

## 2022-05-04 DIAGNOSIS — D500 Iron deficiency anemia secondary to blood loss (chronic): Secondary | ICD-10-CM

## 2022-05-04 LAB — CBC WITH DIFFERENTIAL (CANCER CENTER ONLY)
Abs Immature Granulocytes: 0.05 10*3/uL (ref 0.00–0.07)
Basophils Absolute: 0 10*3/uL (ref 0.0–0.1)
Basophils Relative: 0 %
Eosinophils Absolute: 0.1 10*3/uL (ref 0.0–0.5)
Eosinophils Relative: 2 %
HCT: 37.6 % — ABNORMAL LOW (ref 39.0–52.0)
Hemoglobin: 12 g/dL — ABNORMAL LOW (ref 13.0–17.0)
Immature Granulocytes: 1 %
Lymphocytes Relative: 22 %
Lymphs Abs: 1.3 10*3/uL (ref 0.7–4.0)
MCH: 28.7 pg (ref 26.0–34.0)
MCHC: 31.9 g/dL (ref 30.0–36.0)
MCV: 90 fL (ref 80.0–100.0)
Monocytes Absolute: 0.6 10*3/uL (ref 0.1–1.0)
Monocytes Relative: 10 %
Neutro Abs: 3.7 10*3/uL (ref 1.7–7.7)
Neutrophils Relative %: 65 %
Platelet Count: 185 10*3/uL (ref 150–400)
RBC: 4.18 MIL/uL — ABNORMAL LOW (ref 4.22–5.81)
RDW: 13.5 % (ref 11.5–15.5)
WBC Count: 5.7 10*3/uL (ref 4.0–10.5)
nRBC: 0 % (ref 0.0–0.2)

## 2022-05-04 LAB — FERRITIN: Ferritin: 115 ng/mL (ref 24–336)

## 2022-05-04 LAB — IRON AND IRON BINDING CAPACITY (CC-WL,HP ONLY)
Iron: 97 ug/dL (ref 45–182)
Saturation Ratios: 24 % (ref 17.9–39.5)
TIBC: 400 ug/dL (ref 250–450)
UIBC: 303 ug/dL (ref 117–376)

## 2022-05-04 LAB — RETICULOCYTES
Immature Retic Fract: 9.5 % (ref 2.3–15.9)
RBC.: 4.19 MIL/uL — ABNORMAL LOW (ref 4.22–5.81)
Retic Count, Absolute: 63.3 10*3/uL (ref 19.0–186.0)
Retic Ct Pct: 1.5 % (ref 0.4–3.1)

## 2022-05-11 NOTE — Assessment & Plan Note (Signed)
Patient encouraged to maintain heart healthy diet, regular exercise, adequate sleep. Consider daily probiotics. Take medications as prescribed. Labs reviewed. Immunizations reviewed and discussed. Colonoscopy last 2018 due soon

## 2022-05-11 NOTE — Assessment & Plan Note (Signed)
And nutrients after gastric bypass, Supplement and monitor

## 2022-05-11 NOTE — Assessment & Plan Note (Signed)
Encourage heart healthy diet such as MIND or DASH diet, increase exercise, avoid trans fats, simple carbohydrates and processed foods, consider a krill or fish or flaxseed oil cap daily. Tolerating statin 

## 2022-05-11 NOTE — Assessment & Plan Note (Signed)
Well controlled, no changes to meds. Encouraged heart healthy diet such as the DASH diet and exercise as tolerated.

## 2022-05-11 NOTE — Assessment & Plan Note (Signed)
hgba1c acceptable, minimize simple carbs. Increase exercise as tolerated. Continue current meds 

## 2022-05-11 NOTE — Assessment & Plan Note (Signed)
Supplement and monitor 

## 2022-05-11 NOTE — Assessment & Plan Note (Addendum)
hydrate and monitor

## 2022-05-12 ENCOUNTER — Ambulatory Visit (INDEPENDENT_AMBULATORY_CARE_PROVIDER_SITE_OTHER): Payer: Medicare Other | Admitting: Family Medicine

## 2022-05-12 VITALS — BP 110/72 | HR 74 | Temp 98.0°F | Resp 16 | Ht 68.0 in | Wt 256.8 lb

## 2022-05-12 DIAGNOSIS — E782 Mixed hyperlipidemia: Secondary | ICD-10-CM

## 2022-05-12 DIAGNOSIS — E559 Vitamin D deficiency, unspecified: Secondary | ICD-10-CM | POA: Diagnosis not present

## 2022-05-12 DIAGNOSIS — K909 Intestinal malabsorption, unspecified: Secondary | ICD-10-CM | POA: Diagnosis not present

## 2022-05-12 DIAGNOSIS — K219 Gastro-esophageal reflux disease without esophagitis: Secondary | ICD-10-CM | POA: Diagnosis not present

## 2022-05-12 DIAGNOSIS — E1100 Type 2 diabetes mellitus with hyperosmolarity without nonketotic hyperglycemic-hyperosmolar coma (NKHHC): Secondary | ICD-10-CM | POA: Diagnosis not present

## 2022-05-12 DIAGNOSIS — E538 Deficiency of other specified B group vitamins: Secondary | ICD-10-CM

## 2022-05-12 DIAGNOSIS — I10 Essential (primary) hypertension: Secondary | ICD-10-CM | POA: Diagnosis not present

## 2022-05-12 DIAGNOSIS — D62 Acute posthemorrhagic anemia: Secondary | ICD-10-CM | POA: Diagnosis not present

## 2022-05-12 DIAGNOSIS — Z111 Encounter for screening for respiratory tuberculosis: Secondary | ICD-10-CM

## 2022-05-12 DIAGNOSIS — Z0000 Encounter for general adult medical examination without abnormal findings: Secondary | ICD-10-CM

## 2022-05-12 DIAGNOSIS — M1A9XX0 Chronic gout, unspecified, without tophus (tophi): Secondary | ICD-10-CM | POA: Diagnosis not present

## 2022-05-12 DIAGNOSIS — Z23 Encounter for immunization: Secondary | ICD-10-CM | POA: Diagnosis not present

## 2022-05-12 NOTE — Progress Notes (Signed)
Subjective:   By signing my name below, I, Brent [name], attest that this documentation has been prepared under the direction and in the presence of Brent Lukes, MD., 05/12/2022.     Patient ID: Brent [name], male    DOB: 1955/12/01, 67 y.o.   MRN: [id number]  Chief Complaint  Patient presents with   Annual Exam    Annual Exam   HPI Patient is in today for a comprehensive physical exam and follow up on chronic medical concerns. He denies CP/palpitations/SOB/HA/congestion/fever/ chills/GI or GU symptoms.  Diet/Exercise He maintains a relatively balanced diet and occasionally exercises.  Family History No changes to the family history.  Psychiatry He is interested in seeing Dr. Linton Flemings to manage his mental health. Past Medical History:  Diagnosis Date   Acid reflux disease    ACID REFLUX DISEASE 07/05/2007   Acute gastric ulcer with bleeding    Alcohol abuse    Anemia    Atherosclerosis    Benign neoplasm of colon 07/05/2007   Bilateral hip pain 10/05/2016   Bladder cancer ([place])    Breast pain, left 11/17/2011   CAD (coronary artery disease)    Chest pain    a. Reportedly negative dobut echo performed prior to gastric bypass in 03/2011;  b. 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 12/2011 Cath: LM nl, LAD 50p, D1 67m LCX min irregs, OM3 30, RCA 25p, 774mFFR 0.99->0.89), PDA 30, EF 65%, Med Rx. d. Cath 11/2021 - 90% m RCA s/p PCI/DES x1   COLONIC POLYPS, HX OF 10/29/2008   Diarrhea 06/13/2010   Diverticulosis 07/05/2018   DIVERTICULOSIS, COLON 10/29/2008   DM (diabetes mellitus), type 2, uncontrolled    GI bleed    Gout 03/04/2013   Hearing loss 05/24/2013   Previous audiology evaluation completely.   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   Neck pain 03/2015   Other and unspecified hyperlipidemia 11/16/2012   Post-operative nausea and vomiting    after the surgery in hospital in March 2020/ past the cauderization   Preventative health care 11/17/2011   Renal stone 11/2013   Sleep apnea    a. CPAP   Spinal stenosis    Tear of meniscus of left knee 2012    Past Surgical History:  Procedure Laterality Date   ANKLE SURGERY Right 1994   BICEPS TENDON REPAIR     left side   CARDIAC CATHETERIZATION     denies any chest pain in the past 2 years   COLONOSCOPY     colonoscopy polyps     CORONARY STENT INTERVENTION N/A 11/28/2021   Procedure: CORONARY STENT INTERVENTION;  Surgeon: JoMartiniquePeter M, MD;  [facility];  Service: Cardiovascular;  Laterality: N/A;   ESOPHAGOGASTRODUODENOSCOPY N/A 03/27/2013   Procedure: ESOPHAGOGASTRODUODENOSCOPY (EGD);  Surgeon: JoIrene ShipperMD;  [facility];  Service: Endoscopy;  Laterality: N/A;   ESOPHAGOGASTRODUODENOSCOPY (EGD) WITH PROPOFOL N/A 07/06/2018   Procedure: ESOPHAGOGASTRODUODENOSCOPY (EGD) WITH PROPOFOL;  Surgeon: PyJerene BearsMD;  [facility];  Service: Gastroenterology;  Laterality: N/A;   ESOPHAGOGASTRODUODENOSCOPY (EGD) WITH PROPOFOL N/A 02/05/2022   Procedure: ESOPHAGOGASTRODUODENOSCOPY (EGD) WITH PROPOFOL;  Surgeon: CiLavena BullionDO;  Location:  [facility];  Service: Gastroenterology;  Laterality: N/A;   GASTRIC BYPASS     HEMOSTASIS CLIP PLACEMENT  02/05/2022   Procedure: HEMOSTASIS CLIP PLACEMENT;  Surgeon: Lavena Bullion, DO;  [facility];  Service: Gastroenterology;;   HERNIA REPAIR     HOT HEMOSTASIS N/A 07/06/2018   Procedure: HOT HEMOSTASIS (ARGON PLASMA COAGULATION/BICAP);  Surgeon: Jerene Bears, MD;  [facility];  Service: Gastroenterology;  Laterality: N/A;   KNEE ARTHROSCOPY Left 11/06/2010   Left, torn meniscus (repaired)   LEFT HEART CATH AND CORONARY  ANGIOGRAPHY N/A 11/28/2021   Procedure: LEFT HEART CATH AND CORONARY ANGIOGRAPHY;  Surgeon: Martinique, Peter M, MD;  Location: [facility];  Service: Cardiovascular;  Laterality: N/A;   LEFT HEART CATHETERIZATION WITH CORONARY ANGIOGRAM N/A 12/23/2011   Procedure: LEFT HEART CATHETERIZATION WITH CORONARY ANGIOGRAM;  Surgeon: Minus Breeding, MD;  Location: [facility];  Service: Cardiovascular;  Laterality: N/A;   REPLACEMENT TOTAL KNEE Right 01/2016   removed scar tissue/ cut tip of nerve bundle and re-route   right knee arthroscopy Right 07/05/14   Dr. Hart Robinsons, [place].   ROTATOR CUFF REPAIR  2019   left   SCHLEROTHERAPY  07/06/2018   Procedure: SCHLEROTHERAPY;  Surgeon: Jerene Bears, MD;  [facility];  Service: Gastroenterology;;   Brent [name]  02/05/2022   Procedure: Brent [name];  Surgeon: Lavena Bullion, DO;  [facility];  Service: Gastroenterology;;   TONSILLECTOMY  age 70   TONSILLECTOMY     as a child   TOTAL KNEE ARTHROPLASTY Right 01/20/2016   Procedure: RIGHT TOTAL KNEE ARTHROPLASTY;  Surgeon: Brent Cancel, MD;  [facility];  Service: Orthopedics;  Laterality: Right;   TRANSURETHRAL RESECTION OF BLADDER TUMOR N/A 09/24/2020   Procedure: TRANSURETHRAL RESECTION OF BLADDER TUMOR (TURBT)  RIGHT retrograde pylegram;  Surgeon: [name], MD;  [facility];  Service: Urology;  Laterality: N/A;   UPPER GI ENDOSCOPY  03/27/13    Family History  Problem Relation Age of Onset   Diabetes Mother    Hypertension Mother    Stroke Mother    Hyperlipidemia Mother    Hypertension Father    Colon polyps Father    Heart attack Father 53   Stroke Father    Heart attack Brother    Diabetes Brother    Heart disease Brother    Heart attack Brother        Multiple   Diabetes Brother    Diabetes Sister    Stroke Sister    Obesity Brother    Diabetes Brother    Heart disease Brother    Hypertension Maternal Grandmother    ADD / ADHD  Daughter    Heart disease Brother    Stomach cancer Neg Hx    Colon cancer Neg Hx    Esophageal cancer Neg Hx    Rectal cancer Neg Hx     Social History   Socioeconomic History   Marital status: Married    Spouse name: Not on file   Number of children: 2   Years of education: Not on file   Highest education level: Not on file  Occupational History   Occupation: TEACHER    Employer: [employer]  Tobacco Use   Smoking status: Former    Packs/day: 1.50    Years: 20.00    Total pack years: 30.00    Types: Cigarettes    Quit date: 04/21/1991    Years since quitting: 74.0  Smokeless tobacco: Never  Vaping Use   Vaping Use: Never used  Substance and Sexual Activity   Alcohol use: Yes    Alcohol/week: 3.0 standard drinks of alcohol    Types: 3 Cans of beer per week    Comment: weekly   Drug use: No   Sexual activity: Not Currently  Other Topics Concern   Not on file  Social History Narrative   Lives with wife in [place].  Does not routinely exercise.   Social Determinants of Health   Financial Resource Strain: Low Risk  (12/01/2021)   Overall Financial Resource Strain (CARDIA)    Difficulty of Paying Living Expenses: Not hard at all  Food Insecurity: No Food Insecurity (02/06/2022)   Hunger Vital Sign    Worried About Running Out of Food in the Last Year: Never true    Ran Out of Food in the Last Year: Never true  Transportation Needs: No Transportation Needs (02/06/2022)   PRAPARE - Hydrologist (Medical): No    Lack of Transportation (Non-Medical): No  Physical Activity: Inactive (12/01/2021)   Exercise Vital Sign    Days of Exercise per Week: 0 days    Minutes of Exercise per Session: 0 min  Stress: No Stress Concern Present (12/01/2021)   [place]    Feeling of Stress : Not at all  Social Connections: [place] (12/01/2021)   Social Connection and  Isolation Panel [NHANES]    Frequency of Communication with Friends and Family: More than three times a week    Frequency of Social Gatherings with Friends and Family: More than three times a week    Attends Religious Services: More than 4 times per year    Active Member of [employer] or Organizations: Yes    Attends Music therapist: More than 4 times per year    Marital Status: Married  Human resources officer Violence: Not At Risk (02/06/2022)   Humiliation, Afraid, Rape, and Kick questionnaire    Fear of Current or Ex-Partner: No    Emotionally Abused: No    Physically Abused: No    Sexually Abused: No    Outpatient Medications Prior to Visit  Medication Sig Dispense Refill   acetaminophen (TYLENOL) 500 MG tablet Take 1,000 mg by mouth every 6 (six) hours as needed for moderate pain or headache.     amitriptyline (ELAVIL) 50 MG tablet TAKE 1 TABLET AT BEDTIME (Patient taking differently: Take 50 mg by mouth at bedtime.) 90 tablet 3   amoxicillin (AMOXIL) 500 MG capsule Take 2,000 mg by mouth See admin instructions. Take 4 capsules (2,000 mg) prior to dental appointments     Cholecalciferol (VITAMIN D) 50 MCG (2000 UT) tablet Take 6,000 Units by mouth daily.     clopidogrel (PLAVIX) 75 MG tablet Take 1 tablet (75 mg total) by mouth daily with breakfast. 90 tablet 3   Cyanocobalamin (VITAMIN B-12) 1000 MCG SUBL Place 1,000 mcg under the tongue 2 (two) times a week.     EPINEPHrine 0.3 mg/0.3 mL IJ SOAJ injection INJECT 0.3 MLS (0.3 MG TOTAL) INTO THE MUSCLE ONCE FOR 1 DOSE 2 each 11   ezetimibe (ZETIA) 10 MG tablet Take 1 tablet (10 mg total) by mouth daily. 90 tablet 3   famotidine (PEPCID) 40 MG tablet TAKE 1 TABLET AT BEDTIME AS NEEDED FOR HEARTBURN OR INDIGESTION 90 tablet 3   fenofibrate 160 MG tablet Take 1 tablet (160 mg  total) by mouth daily. 90 tablet 1   Ferrous Gluconate-C-Folic Acid (IRON-C PO) Take 1 tablet by mouth daily. Iron 65 mg and vit C-125 mg     fluticasone  (FLONASE) 50 MCG/ACT nasal spray USE 2 SPRAYS IN EACH NOSTRIL DAILY 48 g 3   gabapentin (NEURONTIN) 300 MG capsule TAKE 1 CAPSULE TWICE A DAY AND 2 CAPSULES AT BEDTIME (Patient taking differently: Take 300-900 mg by mouth See admin instructions. Take 1 capsule (300 mg) by mouth in the morning and at noon then take 3 capsules (900 mg) in the afternoon and at bedtime) 360 capsule 1   hydrochlorothiazide (MICROZIDE) 12.5 MG capsule TAKE 1 CAPSULE DAILY 90 capsule 2   isosorbide mononitrate (IMDUR) 30 MG 24 hr tablet Take 2 tablets (60 mg total) by mouth daily. 180 tablet 3   losartan (COZAAR) 50 MG tablet TAKE 1 TABLET DAILY (KEEP UPCOMING APPOINTMENT IN MARCH 2023 WITH CARDIOLOGIST BEFORE ANYMORE REFILLS) (Patient taking differently: Take 50 mg by mouth daily.) 90 tablet 3   Magnesium 100 MG CAPS Take 1 capsule (100 mg total) by mouth daily. 30 capsule 1   metoprolol tartrate (LOPRESSOR) 100 MG tablet Take 1 tablet (100 mg total) by mouth 2 (two) times daily. 180 tablet 3   mometasone (ELOCON) 0.1 % cream Apply 1 Application topically daily. 90 g 1   Multiple Vitamin (MULTIVITAMIN) tablet Take 1 tablet by mouth daily.     nitroGLYCERIN (NITROSTAT) 0.4 MG SL tablet Place 1 tablet (0.4 mg total) under the tongue every 5 (five) minutes as needed. 25 tablet 11   oxycodone (OXY-IR) 5 MG capsule Take 5 mg by mouth 3 (three) times daily as needed for pain.     pantoprazole (PROTONIX) 40 MG tablet TAKE 1 TABLET TWICE A DAY BEFORE MEALS (Patient taking differently: Take 40 mg by mouth 2 (two) times daily before a meal.) 180 tablet 3   Pitavastatin Calcium (LIVALO) 2 MG TABS Take 1 tablet (2 mg total) by mouth daily. 90 tablet 3   Dulaglutide (TRULICITY) 1.5 MV/6.7MC SOPN Inject 3 mg into the skin once a week. Saturday     No facility-administered medications prior to visit.    Allergies  Allergen Reactions   Bee Venom Anaphylaxis   Lipitor [Atorvastatin] Other (See Comments)    Myalgias, memory changes    Morphine Other (See Comments)    hyperactive   Metformin And Related Other (See Comments)    Myalgias and weakness   Lioresal [Baclofen] Other (See Comments)    Acted drunk   Medrol [Methylprednisolone] Other (See Comments)    Hyperglycemia    Zocor [Simvastatin] Other (See Comments)    Joint pain   Hydrocodone Itching   Latex Rash   Pravachol [Pravastatin] Other (See Comments)    Joint pain    Review of Systems  Constitutional:  Negative for chills and fever.  HENT:  Negative for congestion.   Respiratory:  Negative for shortness of breath.   Cardiovascular:  Negative for chest pain and palpitations.  Gastrointestinal:  Negative for abdominal pain, blood in stool, constipation, diarrhea, nausea and vomiting.  Genitourinary:  Negative for dysuria, frequency, hematuria and urgency.  Skin:           Neurological:  Negative for headaches.       Objective:    Physical Exam Constitutional:      General: He is not in acute distress.    Appearance: Normal appearance. He is normal weight. He is not ill-appearing.  HENT:     Head: Normocephalic and atraumatic.     Right Ear: Tympanic membrane, ear canal and external ear normal.     Left Ear: Tympanic membrane, ear canal and external ear normal.     Nose: Nose normal.     Mouth/Throat:     Mouth: Mucous membranes are moist.     Pharynx: Oropharynx is clear.  Eyes:     General:        Right eye: No discharge.        Left eye: No discharge.     Extraocular Movements: Extraocular movements intact.     Right eye: No nystagmus.     Left eye: No nystagmus.     Pupils: Pupils are equal, round, and reactive to light.  Neck:     Vascular: No carotid bruit.  Cardiovascular:     Rate and Rhythm: Normal rate and regular rhythm.     Pulses: Normal pulses.     Heart sounds: Normal heart sounds. No murmur heard.    No gallop.  Pulmonary:     Effort: Pulmonary effort is normal. No respiratory distress.     Breath sounds: Normal  breath sounds. No wheezing or rales.  Abdominal:     General: Bowel sounds are normal.     Palpations: Abdomen is soft.     Tenderness: There is no abdominal tenderness. There is no guarding.  Musculoskeletal:        General: Normal range of motion.     Cervical back: Normal range of motion.     Right lower leg: No edema.     Left lower leg: No edema.     Comments: Muscle strength 5/5 on upper and lower extremities.   Lymphadenopathy:     Cervical: No cervical adenopathy.  Skin:    General: Skin is warm and dry.  Neurological:     Mental Status: He is alert and oriented to person, place, and time.     Sensory: Sensation is intact.     Motor: Motor function is intact.     Coordination: Coordination is intact.     Deep Tendon Reflexes:     Reflex Scores:      Patellar reflexes are 2+ on the right side and 2+ on the left side.    Comments: Numbness in the right knee.  Psychiatric:        Mood and Affect: Mood normal.        Behavior: Behavior normal.        Judgment: Judgment normal.    BP 110/72 (BP Location: Right Arm, Patient Position: Sitting, Cuff Size: Normal)   Pulse 74   Temp 98 F (36.7 C) (Oral)   Resp 16   Ht '5\' 8"'$  (1.727 m)   Wt 256 lb 12.8 oz (116.5 kg)   SpO2 95%   BMI 39.05 kg/m  Wt Readings from Last 3 Encounters:  05/12/22 256 lb 12.8 oz (116.5 kg)  04/23/22 260 lb (117.9 kg)  03/10/22 260 lb (117.9 kg)    Diabetic Foot Exam - Simple   No data filed    Lab Results  Component Value Date   WBC 5.7 05/04/2022   HGB 12.0 (L) 05/04/2022   HCT 37.6 (L) 05/04/2022   PLT 185 05/04/2022   GLUCOSE 129 (H) 02/17/2022   CHOL 102 03/23/2022   TRIG 102 03/23/2022   HDL 44 03/23/2022   LDLDIRECT 74.0 05/22/2021   LDLCALC 39 03/23/2022   ALT 16 03/23/2022  AST 29 03/23/2022   NA 139 02/17/2022   K 4.7 02/17/2022   CL 106 02/17/2022   CREATININE 1.21 02/17/2022   BUN 18 02/17/2022   CO2 27 02/17/2022   TSH 1.340 11/24/2021   PSA 1.55 06/25/2017    INR 1.1 02/04/2022   HGBA1C 6 % 12/10/2021   MICROALBUR 1.1 12/02/2015    Lab Results  Component Value Date   TSH 1.340 11/24/2021   Lab Results  Component Value Date   WBC 5.7 05/04/2022   HGB 12.0 (L) 05/04/2022   HCT 37.6 (L) 05/04/2022   MCV 90.0 05/04/2022   PLT 185 05/04/2022   Lab Results  Component Value Date   NA 139 02/17/2022   K 4.7 02/17/2022   CO2 27 02/17/2022   GLUCOSE 129 (H) 02/17/2022   BUN 18 02/17/2022   CREATININE 1.21 02/17/2022   BILITOT 0.3 03/23/2022   ALKPHOS 39 (L) 03/23/2022   AST 29 03/23/2022   ALT 16 03/23/2022   PROT 6.6 03/23/2022   ALBUMIN 4.8 03/23/2022   CALCIUM 9.0 02/17/2022   ANIONGAP 8 02/07/2022   EGFR 64 12/08/2021   GFR 62.45 02/17/2022   Lab Results  Component Value Date   CHOL 102 03/23/2022   Lab Results  Component Value Date   HDL 44 03/23/2022   Lab Results  Component Value Date   LDLCALC 39 03/23/2022   Lab Results  Component Value Date   TRIG 102 03/23/2022   Lab Results  Component Value Date   CHOLHDL 2.3 03/23/2022   Lab Results  Component Value Date   HGBA1C 6 % 12/10/2021      Assessment & Plan:  Colonoscopy: Last completed on 09/01/2021. Repeat in 5 years. -Two 2 to 3 mm polyps in the sigmoid colon and in the cecum, removed with a cold snare. Resected and retrieved. -The examination was otherwise normal on direct and retroflexion views.  PSA: Last completed on 06/25/2017 with 1.55 ng/mL.   Advanced Directives: Encouraged patient to complete advanced care planning documents.  Healthy Lifestyle: Encouraged a minimum of 4000 steps daily, 60-80 oz of fluids daily, and a heart healthy diet.  Immunizations: Reviewed patient's immunization history and encouraged COVID-19 immunization. MMR immunization will be boosted today in office.  TB: A TB test of the arm will be completed today and patient will return in 2 days to have the results reviewed.  Labs: Routine blood work will be completed  today. Problem List Items Addressed This Visit     T2DM (type 2 diabetes mellitus) ([place])    hgba1c acceptable, minimize simple carbs. Increase exercise as tolerated. Continue current meds      Acute blood loss anemia    Anemia nearly resolved and no sign of recurrent bleeding      Essential hypertension    Well controlled, no changes to meds. Encouraged heart healthy diet such as the DASH diet and exercise as tolerated.       Gastroesophageal reflux disease    Avoid offending foods, start probiotics. Do not eat large meals in late evening and consider raising head of bed.        Gout    hydrate and monitor       Hyperlipidemia, mixed - Primary    Encourage heart healthy diet such as MIND or DASH diet, increase exercise, avoid trans fats, simple carbohydrates and processed foods, consider a krill or fish or flaxseed oil cap daily. Tolerating statin      Malabsorption of iron  And nutrients after gastric bypass, Supplement and monitor       Preventative health care    Patient encouraged to maintain heart healthy diet, regular exercise, adequate sleep. Consider daily probiotics. Take medications as prescribed. Labs reviewed. Immunizations reviewed and discussed. Colonoscopy last 2018 due soon      Vitamin B12 deficiency    Supplement and monitor      Vitamin D deficiency    Supplement and monitor       Other Visit Diagnoses     Need for MMR vaccine       Relevant Orders   MMR vaccine subcutaneous (Completed)   Screening for tuberculosis       Relevant Orders   TB Skin Test (Completed)      No orders of the defined types were placed in this encounter.  I, [name], MD, personally preformed the services described in this documentation.  All medical record entries made by the scribe were at my direction and in my presence.  I have reviewed the chart and discharge instructions (if applicable) and agree that the record reflects my personal performance and is  accurate and complete. 05/12/2022  I,Mohammed Iqbal,acting as a scribe for [name], MD.,have documented all relevant documentation on the behalf of [name], MD,as directed by  [name], MD while in the presence of [name], MD.  [name], MD

## 2022-05-12 NOTE — Patient Instructions (Signed)
Drink 60-80 ounces of fluids daily  Preventive Care 65 Years and Older, Male Preventive care refers to lifestyle choices and visits with your health care provider that can promote health and wellness. Preventive care visits are also called wellness exams. What can I expect for my preventive care visit? Counseling During your preventive care visit, your health care provider may ask about your: Medical history, including: Past medical problems. Family medical history. History of falls. Current health, including: Emotional well-being. Home life and relationship well-being. Sexual activity. Memory and ability to understand (cognition). Lifestyle, including: Alcohol, nicotine or tobacco, and drug use. Access to firearms. Diet, exercise, and sleep habits. Work and work Statistician. Sunscreen use. Safety issues such as seatbelt and bike helmet use. Physical exam Your health care provider will check your: Height and weight. These may be used to calculate your BMI (body mass index). BMI is a measurement that tells if you are at a healthy weight. Waist circumference. This measures the distance around your waistline. This measurement also tells if you are at a healthy weight and may help predict your risk of certain diseases, such as type 2 diabetes and high blood pressure. Heart rate and blood pressure. Body temperature. Skin for abnormal spots. What immunizations do I need?  Vaccines are usually given at various ages, according to a schedule. Your health care provider will recommend vaccines for you based on your age, medical history, and lifestyle or other factors, such as travel or where you work. What tests do I need? Screening Your health care provider may recommend screening tests for certain conditions. This may include: Lipid and cholesterol levels. Diabetes screening. This is done by checking your blood sugar (glucose) after you have not eaten for a while (fasting). Hepatitis C  test. Hepatitis B test. HIV (human immunodeficiency virus) test. STI (sexually transmitted infection) testing, if you are at risk. Lung cancer screening. Colorectal cancer screening. Prostate cancer screening. Abdominal aortic aneurysm (AAA) screening. You may need this if you are a current or former smoker. Talk with your health care provider about your test results, treatment options, and if necessary, the need for more tests. Follow these instructions at home: Eating and drinking  Eat a diet that includes fresh fruits and vegetables, whole grains, lean protein, and low-fat dairy products. Limit your intake of foods with high amounts of sugar, saturated fats, and salt. Take vitamin and mineral supplements as recommended by your health care provider. Do not drink alcohol if your health care provider tells you not to drink. If you drink alcohol: Limit how much you have to 0-2 drinks a day. Know how much alcohol is in your drink. In the [place] one drink equals one 12 oz bottle of beer (355 mL), one 5 oz glass of wine (148 mL), or one 1 oz glass of hard liquor (44 mL). Lifestyle Brush your teeth every morning and night with fluoride toothpaste. Floss one time each day. Exercise for at least 30 minutes 5 or more days each week. Do not use any products that contain nicotine or tobacco. These products include cigarettes, chewing tobacco, and vaping devices, such as e-cigarettes. If you need help quitting, ask your health care provider. Do not use drugs. If you are sexually active, practice safe sex. Use a condom or other form of protection to prevent STIs. Take aspirin only as told by your health care provider. Make sure that you understand how much to take and what form to take. Work with your health care provider  to find out whether it is safe and beneficial for you to take aspirin daily. Ask your health care provider if you need to take a cholesterol-lowering medicine (statin). Find healthy  ways to manage stress, such as: Meditation, yoga, or listening to music. Journaling. Talking to a trusted person. Spending time with friends and family. Safety Always wear your seat belt while driving or riding in a vehicle. Do not drive: If you have been drinking alcohol. Do not ride with someone who has been drinking. When you are tired or distracted. While texting. If you have been using any mind-altering substances or drugs. Wear a helmet and other protective equipment during sports activities. If you have firearms in your house, make sure you follow all gun safety procedures. Minimize exposure to UV radiation to reduce your risk of skin cancer. What's next? Visit your health care provider once a year for an annual wellness visit. Ask your health care provider how often you should have your eyes and teeth checked. Stay up to date on all vaccines. This information is not intended to replace advice given to you by your health care provider. Make sure you discuss any questions you have with your health care provider. Document Revised: 10/02/2020 Document Reviewed: 10/02/2020 Elsevier Patient Education  [place].

## 2022-05-14 ENCOUNTER — Ambulatory Visit: Payer: Medicare Other

## 2022-05-14 LAB — TB SKIN TEST
Induration: NEGATIVE mm
TB Skin Test: NEGATIVE

## 2022-05-17 NOTE — Assessment & Plan Note (Signed)
Anemia nearly resolved and no sign of recurrent bleeding

## 2022-05-17 NOTE — Assessment & Plan Note (Signed)
Avoid offending foods, start probiotics. Do not eat large meals in late evening and consider raising head of bed.  

## 2022-06-01 DIAGNOSIS — G4730 Sleep apnea, unspecified: Secondary | ICD-10-CM | POA: Diagnosis not present

## 2022-06-01 DIAGNOSIS — G4733 Obstructive sleep apnea (adult) (pediatric): Secondary | ICD-10-CM | POA: Diagnosis not present

## 2022-06-07 ENCOUNTER — Encounter (INDEPENDENT_AMBULATORY_CARE_PROVIDER_SITE_OTHER): Payer: Medicare Other | Admitting: Cardiology

## 2022-06-07 DIAGNOSIS — I2510 Atherosclerotic heart disease of native coronary artery without angina pectoris: Secondary | ICD-10-CM | POA: Diagnosis not present

## 2022-06-08 NOTE — Progress Notes (Signed)
Subjective:   By signing my name below, I, Brent [name], attest that this documentation has been prepared under the direction and in the presence of Brent Pear, NP 06/09/22   Patient ID: Brent [name], male    DOB: [birth year], 67 y.o.   MRN: [id number]  Chief Complaint  Patient presents with   Hypertension    Here for follow up   Diabetes    Here for follow up   Pain    History of chronic knee pain    HPI Patient is in today for a 6 month follow up.   Robaxin: He reports he would like to start Robaxin again. He previously stopped due to GI bleeds. He used to take it it 4 times a day (morning, noon, night, and before bed) for back pains.   Anemia: He has been anemic for some time.   Cardiology: He states he had a stent placed in 11/2021 and has his next follow up visit scheduled in 07/2022. He stopped taking Plavix and has been taking Aspirin 81 mg.  Diabetes: He has been managing his diabetes well. He has not needed to take Nitroglycerin in about 2 months. He has a visit scheduled for endocrinology.  Lab Results  Component Value Date   HGBA1C 6 % 12/10/2021   Blood pressure: He has been managing his blood pressure well.  BP Readings from Last 3 Encounters:  06/09/22 115/65  05/12/22 110/72  04/23/22 137/83   Acid Reflux: He has been taking Pantoprazole 40 mg to manage symptoms.  Gout: He denies having had a gout flare in many years. He has been watching what he eats, trying to eat somewhat healthy.   Sleep: He has been sleeping okay overall but sometimes his back pain makes it difficult to sleep.   PTSD: He reports he has been told he may have PTSD. He plans to schedule an appointment with a counselor.   Vitamin B12 Deficiency: He takes a sublingual supplement twice daily.  Lab Results  Component Value Date   [id number] 01/20/2021   Cholesterol: He is compliant with Zetia 10 mg daily.  Lab Results  Component Value Date   CHOL 102 03/23/2022   HDL  44 03/23/2022   LDLCALC 39 03/23/2022   LDLDIRECT 74.0 05/22/2021   TRIG 102 03/23/2022   CHOLHDL 2.3 03/23/2022   Past Medical History:  Diagnosis Date   Acid reflux disease    ACID REFLUX DISEASE 07/05/2007   Acute gastric ulcer with bleeding    Alcohol abuse    Anemia    Atherosclerosis    Benign neoplasm of colon 07/05/2007   Bilateral hip pain 10/05/2016   Bladder cancer ([place])    Breast pain, left 11/17/2011   CAD (coronary artery disease)    Chest pain    a. Reportedly negative dobut echo performed prior to gastric bypass in 03/2011;  b. 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 12/2011 Cath: LM nl, LAD 50p, D1 10m LCX min irregs, OM3 30, RCA 25p, 772mFFR 0.99->0.89), PDA 30, EF 65%, Med Rx. d. Cath 11/2021 - 90% m RCA s/p PCI/DES x1   COLONIC POLYPS, HX OF 10/29/2008   Diarrhea 06/13/2010   Diverticulosis 07/05/2018   DIVERTICULOSIS, COLON 10/29/2008   DM (diabetes mellitus), type 2, uncontrolled    GI bleed    Gout 03/04/2013   Hearing loss 05/24/2013   Previous audiology evaluation completely.   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   Neck pain 03/2015   Other and unspecified hyperlipidemia 11/16/2012   Post-operative nausea and vomiting    after the surgery in hospital in March 2020/ past the cauderization   Preventative health care 11/17/2011   Renal stone 11/2013   Sleep apnea    a. CPAP   Spinal stenosis    Tear of meniscus of left knee 2012    Past Surgical History:  Procedure Laterality Date   ANKLE SURGERY Right 1994   BICEPS TENDON REPAIR     left side   CARDIAC CATHETERIZATION     denies any chest pain in the past 2 years   COLONOSCOPY     colonoscopy polyps     CORONARY STENT INTERVENTION  N/A 11/28/2021   Procedure: CORONARY STENT INTERVENTION;  Surgeon: Martinique, Peter M, MD;  Location: [facility];  Service: Cardiovascular;  Laterality: N/A;   ESOPHAGOGASTRODUODENOSCOPY N/A 03/27/2013   Procedure: ESOPHAGOGASTRODUODENOSCOPY (EGD);  Surgeon: Irene Shipper, MD;  Location: [facility];  Service: Endoscopy;  Laterality: N/A;   ESOPHAGOGASTRODUODENOSCOPY (EGD) WITH PROPOFOL N/A 07/06/2018   Procedure: ESOPHAGOGASTRODUODENOSCOPY (EGD) WITH PROPOFOL;  Surgeon: Jerene Bears, MD;  Location: [facility];  Service: Gastroenterology;  Laterality: N/A;   ESOPHAGOGASTRODUODENOSCOPY (EGD) WITH PROPOFOL N/A 02/05/2022   Procedure: ESOPHAGOGASTRODUODENOSCOPY (EGD) WITH PROPOFOL;  Surgeon: Lavena Bullion, DO;  Location: [facility];  Service: Gastroenterology;  Laterality: N/A;   GASTRIC BYPASS     HEMOSTASIS CLIP PLACEMENT  02/05/2022   Procedure: HEMOSTASIS CLIP PLACEMENT;  Surgeon: Lavena Bullion, DO;  [facility]: [facility];  Service: Gastroenterology;;   HERNIA REPAIR     HOT HEMOSTASIS N/A 07/06/2018   Procedure: HOT HEMOSTASIS (ARGON PLASMA COAGULATION/BICAP);  Surgeon: Jerene Bears, MD;  Location: [facility];  Service: Gastroenterology;  Laterality: N/A;   KNEE ARTHROSCOPY Left 11/06/2010   Left, torn meniscus (repaired)   LEFT HEART CATH AND CORONARY ANGIOGRAPHY N/A 11/28/2021   Procedure: LEFT HEART CATH AND CORONARY ANGIOGRAPHY;  Surgeon: Martinique, Peter M, MD;  Location: [facility];  Service: Cardiovascular;  Laterality: N/A;   LEFT HEART CATHETERIZATION WITH CORONARY ANGIOGRAM N/A 12/23/2011   Procedure: LEFT HEART CATHETERIZATION WITH CORONARY ANGIOGRAM;  Surgeon: Minus Breeding, MD;  Location: [facility];  Service: Cardiovascular;  Laterality: N/A;   REPLACEMENT TOTAL KNEE Right 01/2016   removed scar tissue/ cut tip of nerve bundle and re-route   right knee arthroscopy Right 07/05/14   Dr. Hart Robinsons, [place].   ROTATOR CUFF REPAIR  2019   left    SCHLEROTHERAPY  07/06/2018   Procedure: SCHLEROTHERAPY;  Surgeon: Jerene Bears, MD;  Location: [facility];  Service: Gastroenterology;;   Clide Deutscher  02/05/2022   Procedure: Clide Deutscher;  Surgeon: Lavena Bullion, DO;  [facility]: [facility];  Service: Gastroenterology;;   TONSILLECTOMY  age 17   TONSILLECTOMY     as a child   TOTAL KNEE ARTHROPLASTY Right 01/20/2016   Procedure: RIGHT TOTAL KNEE ARTHROPLASTY;  Surgeon: Paralee Cancel, MD;  Location: [facility];  Service: Orthopedics;  Laterality: Right;   TRANSURETHRAL RESECTION OF BLADDER TUMOR N/A 09/24/2020   Procedure: TRANSURETHRAL RESECTION OF BLADDER TUMOR (TURBT)  RIGHT retrograde pylegram;  Surgeon: [name], MD;  Location: [facility];  Service:  Urology;  Laterality: N/A;   UPPER GI ENDOSCOPY  03/27/13    Family History  Problem Relation Age of Onset   Diabetes Mother    Hypertension Mother    Stroke Mother    Hyperlipidemia Mother    Hypertension Father    Colon polyps Father    Heart attack Father 44   Stroke Father    Heart attack Brother    Diabetes Brother    Heart disease Brother    Heart attack Brother        Multiple   Diabetes Brother    Diabetes Sister    Stroke Sister    Obesity Brother    Diabetes Brother    Heart disease Brother    Hypertension Maternal Grandmother    ADD / ADHD Daughter    Heart disease Brother    Stomach cancer Neg Hx    Colon cancer Neg Hx    Esophageal cancer Neg Hx    Rectal cancer Neg Hx     Social History   Socioeconomic History   Marital status: Married    Spouse name: Not on file   Number of children: 2   Years of education: Not on file   Highest education level: Not on file  Occupational History   Occupation: TEACHER    Employer: [employer]  Tobacco Use   Smoking status: Former    Packs/day: 1.50    Years: 20.00    Total pack years: 30.00    Types: Cigarettes    Quit date: 04/21/1991    Years since quitting: 31.1   Smokeless tobacco: Never   Vaping Use   Vaping Use: Never used  Substance and Sexual Activity   Alcohol use: Yes    Alcohol/week: 3.0 standard drinks of alcohol    Types: 3 Cans of beer per week    Comment: weekly   Drug use: No   Sexual activity: Not Currently  Other Topics Concern   Not on file  Social History Narrative   Lives with wife in [place].  Does not routinely exercise.   Social Determinants of Health   Financial Resource Strain: Low Risk  (12/01/2021)   Overall Financial Resource Strain (CARDIA)    Difficulty of Paying Living Expenses: Not hard at all  Food Insecurity: No Food Insecurity (02/06/2022)   Hunger Vital Sign    Worried About Running Out of Food in the Last Year: Never true    Ran Out of Food in the Last Year: Never true  Transportation Needs: No Transportation Needs (02/06/2022)   PRAPARE - Hydrologist (Medical): No    Lack of Transportation (Non-Medical): No  Physical Activity: Inactive (12/01/2021)   Exercise Vital Sign    Days of Exercise per Week: 0 days    Minutes of Exercise per Session: 0 min  Stress: No Stress Concern Present (12/01/2021)   [place]    Feeling of Stress : Not at all  Social Connections: [place] (12/01/2021)   Social Connection and Isolation Panel [NHANES]    Frequency of Communication with Friends and Family: More than three times a week    Frequency of Social Gatherings with Friends and Family: More than three times a week    Attends Religious Services: More than 4 times per year    Active Member of [employer] or Organizations: Yes    Attends Archivist Meetings: More than 4 times per  year    Marital Status: Married  Human resources officer Violence: Not At Risk (02/06/2022)   Humiliation, Afraid, Rape, and Kick questionnaire    Fear of Current or Ex-Partner: No    Emotionally Abused: No    Physically Abused: No    Sexually Abused: No     Outpatient Medications Prior to Visit  Medication Sig Dispense Refill   acetaminophen (TYLENOL) 500 MG tablet Take 1,000 mg by mouth every 6 (six) hours as needed for moderate pain or headache.     amitriptyline (ELAVIL) 50 MG tablet TAKE 1 TABLET AT BEDTIME (Patient taking differently: Take 50 mg by mouth at bedtime.) 90 tablet 3   amoxicillin (AMOXIL) 500 MG capsule Take 2,000 mg by mouth See admin instructions. Take 4 capsules (2,000 mg) prior to dental appointments     Cholecalciferol (VITAMIN D) 50 MCG (2000 UT) tablet Take 6,000 Units by mouth daily.     Cyanocobalamin (VITAMIN B-12) 1000 MCG SUBL Place 1,000 mcg under the tongue 2 (two) times a week.     EPINEPHrine 0.3 mg/0.3 mL IJ SOAJ injection INJECT 0.3 MLS (0.3 MG TOTAL) INTO THE MUSCLE ONCE FOR 1 DOSE 2 each 11   ezetimibe (ZETIA) 10 MG tablet Take 1 tablet (10 mg total) by mouth daily. 90 tablet 3   famotidine (PEPCID) 40 MG tablet TAKE 1 TABLET AT BEDTIME AS NEEDED FOR HEARTBURN OR INDIGESTION 90 tablet 3   fenofibrate 160 MG tablet Take 1 tablet (160 mg total) by mouth daily. 90 tablet 1   Ferrous Gluconate-C-Folic Acid (IRON-C PO) Take 1 tablet by mouth daily. Iron 65 mg and vit C-125 mg     fluticasone (FLONASE) 50 MCG/ACT nasal spray USE 2 SPRAYS IN EACH NOSTRIL DAILY 48 g 3   gabapentin (NEURONTIN) 300 MG capsule TAKE 1 CAPSULE TWICE A DAY AND 2 CAPSULES AT BEDTIME (Patient taking differently: Take 300-900 mg by mouth See admin instructions. Take 1 capsule (300 mg) by mouth in the morning and at noon then take 3 capsules (900 mg) in the afternoon and at bedtime) 360 capsule 1   hydrochlorothiazide (MICROZIDE) 12.5 MG capsule TAKE 1 CAPSULE DAILY 90 capsule 2   isosorbide mononitrate (IMDUR) 30 MG 24 hr tablet Take 2 tablets (60 mg total) by mouth daily. 180 tablet 3   losartan (COZAAR) 50 MG tablet TAKE 1 TABLET DAILY (KEEP UPCOMING APPOINTMENT IN MARCH 2023 WITH CARDIOLOGIST BEFORE ANYMORE REFILLS) (Patient taking  differently: Take 50 mg by mouth daily.) 90 tablet 3   Magnesium 100 MG CAPS Take 1 capsule (100 mg total) by mouth daily. 30 capsule 1   metoprolol tartrate (LOPRESSOR) 100 MG tablet Take 1 tablet (100 mg total) by mouth 2 (two) times daily. 180 tablet 3   mometasone (ELOCON) 0.1 % cream Apply 1 Application topically daily. 90 g 1   Multiple Vitamin (MULTIVITAMIN) tablet Take 1 tablet by mouth daily.     nitroGLYCERIN (NITROSTAT) 0.4 MG SL tablet Place 1 tablet (0.4 mg total) under the tongue every 5 (five) minutes as needed. 25 tablet 11   oxycodone (OXY-IR) 5 MG capsule Take 5 mg by mouth 3 (three) times daily as needed for pain.     pantoprazole (PROTONIX) 40 MG tablet TAKE 1 TABLET TWICE A DAY BEFORE MEALS (Patient taking differently: Take 40 mg by mouth 2 (two) times daily before a meal.) 180 tablet 3   Pitavastatin Calcium (LIVALO) 2 MG TABS Take 1 tablet (2 mg total) by mouth daily. 90 tablet  3   No facility-administered medications prior to visit.    Allergies  Allergen Reactions   Bee Venom Anaphylaxis   Lipitor [Atorvastatin] Other (See Comments)    Myalgias, memory changes   Morphine Other (See Comments)    hyperactive   Metformin And Related Other (See Comments)    Myalgias and weakness   Lioresal [Baclofen] Other (See Comments)    Acted drunk   Medrol [Methylprednisolone] Other (See Comments)    Hyperglycemia    Zocor [Simvastatin] Other (See Comments)    Joint pain   Hydrocodone Itching   Latex Rash   Pravachol [Pravastatin] Other (See Comments)    Joint pain    Review of Systems  Musculoskeletal:  Positive for back pain.  Psychiatric/Behavioral:  The patient has insomnia.        Objective:    Physical Exam Constitutional:      General: He is awake. He is not in acute distress.    Appearance: He is well-developed.  HENT:     Head: Normocephalic and atraumatic.  Cardiovascular:     Rate and Rhythm: Normal rate and regular rhythm.     Heart sounds: No  murmur heard. Pulmonary:     Effort: Pulmonary effort is normal. No respiratory distress.     Breath sounds: Normal breath sounds. No wheezing or rales.  Skin:    General: Skin is warm and dry.  Neurological:     Mental Status: He is alert and oriented to person, place, and time.     Cranial Nerves: No facial asymmetry.  Psychiatric:        Mood and Affect: Mood normal.        Speech: Speech normal.        Behavior: Behavior normal.        Thought Content: Thought content normal.     BP 115/65 (BP Location: Right Arm, Patient Position: Sitting, Cuff Size: Large)   Pulse 83   Temp 98 F (36.7 C) (Oral)   Resp 16   Wt 252 lb (114.3 kg)   SpO2 97%   BMI 38.32 kg/m  Wt Readings from Last 3 Encounters:  06/09/22 252 lb (114.3 kg)  05/12/22 256 lb 12.8 oz (116.5 kg)  04/23/22 260 lb (117.9 kg)       Assessment & Plan:  Coronary artery disease involving native coronary artery of native heart without angina pectoris Assessment & Plan: S/p stent placement last April- has follow up in March with cardiology. He completed plavix therapy and continues 79m once daily.    Essential hypertension Assessment & Plan: BP Readings from Last 3 Encounters:  06/09/22 115/65  05/12/22 110/72  04/23/22 137/83   Maintained on hctz, lopressor, losartan, imdur. Stable.    Gastroesophageal reflux disease without esophagitis Assessment & Plan: Stable on bid pantoprazole and prn pepcid.  He continues to follow with GI.    Chronic gout without tophus, unspecified cause, unspecified site Assessment & Plan: Stable, no recent flares. Tries to stick to a low purine diet best he can.    Primary insomnia Assessment & Plan: Continues Elavil.  Fair control.    Type 2 diabetes mellitus with hyperosmolarity without coma, without long-term current use of insulin ([facility] Assessment & Plan: Lab Results  Component Value Date   HGBA1C 6 % 12/10/2021   HGBA1C 8.0 (H) 09/24/2020   HGBA1C 7.7 (H)  01/11/2020   Lab Results  Component Value Date   MICROALBUR 1.1 12/02/2015   LDLCALC 39 03/23/2022  CREATININE 1.21 02/17/2022     Orders: -     Hemoglobin A1c -     Microalbumin / creatinine urine ratio -     Basic metabolic panel  Vitamin [id number] deficiency Assessment & Plan: Continues sublingual b12, 2x weekly.   Orders: -     Vitamin B12  Hyperlipidemia, mixed Assessment & Plan: Lab Results  Component Value Date   CHOL 102 03/23/2022   HDL 44 03/23/2022   LDLCALC 39 03/23/2022   LDLDIRECT 74.0 05/22/2021   TRIG 102 03/23/2022   CHOLHDL 2.3 03/23/2022   LDL at goal. Continue statin, fibrate/zetia.    Other orders -     Methocarbamol; Take 1 tablet (500 mg total) by mouth 4 (four) times daily.  Dispense: 360 tablet; Refill: 1 -     Aspirin; Take 1 tablet (81 mg total) by mouth daily. Swallow whole.  Dispense: 30 tablet; Refill: 12     I,Rachel Rivera,acting as a Education administrator for Brent Pear, NP.,have documented all relevant documentation on the behalf of Brent Pear, NP,as directed by  Brent Pear, NP while in the presence of Brent Pear, NP.   I, Brent Pear, NP, personally preformed the services described in this documentation.  All medical record entries made by the scribe were at my direction and in my presence.  I have reviewed the chart and discharge instructions (if applicable) and agree that the record reflects my personal performance and is accurate and complete. 06/09/22   Brent Pear, NP

## 2022-06-09 ENCOUNTER — Ambulatory Visit (INDEPENDENT_AMBULATORY_CARE_PROVIDER_SITE_OTHER): Payer: Medicare Other | Admitting: Family

## 2022-06-09 ENCOUNTER — Ambulatory Visit: Payer: Medicare Other | Admitting: Family Medicine

## 2022-06-09 ENCOUNTER — Encounter: Payer: Self-pay | Admitting: Family

## 2022-06-09 VITALS — BP 115/65 | HR 83 | Temp 98.0°F | Resp 16 | Wt 252.0 lb

## 2022-06-09 DIAGNOSIS — M1A9XX0 Chronic gout, unspecified, without tophus (tophi): Secondary | ICD-10-CM

## 2022-06-09 DIAGNOSIS — E782 Mixed hyperlipidemia: Secondary | ICD-10-CM | POA: Diagnosis not present

## 2022-06-09 DIAGNOSIS — I10 Essential (primary) hypertension: Secondary | ICD-10-CM | POA: Diagnosis not present

## 2022-06-09 DIAGNOSIS — E1100 Type 2 diabetes mellitus with hyperosmolarity without nonketotic hyperglycemic-hyperosmolar coma (NKHHC): Secondary | ICD-10-CM

## 2022-06-09 DIAGNOSIS — K219 Gastro-esophageal reflux disease without esophagitis: Secondary | ICD-10-CM

## 2022-06-09 DIAGNOSIS — F5101 Primary insomnia: Secondary | ICD-10-CM

## 2022-06-09 DIAGNOSIS — I2510 Atherosclerotic heart disease of native coronary artery without angina pectoris: Secondary | ICD-10-CM

## 2022-06-09 DIAGNOSIS — E538 Deficiency of other specified B group vitamins: Secondary | ICD-10-CM

## 2022-06-09 MED ORDER — ASPIRIN 81 MG PO TBEC: 81.0000 mg | DELAYED_RELEASE_TABLET | Freq: Every day | ORAL | 12 refills | Status: AC

## 2022-06-09 MED ORDER — METHOCARBAMOL 500 MG PO TABS: 500.0000 mg | ORAL_TABLET | Freq: Four times a day (QID) | ORAL | 1 refills | Status: DC

## 2022-06-09 NOTE — Assessment & Plan Note (Signed)
BP Readings from Last 3 Encounters:  06/09/22 115/65  05/12/22 110/72  04/23/22 137/83   Maintained on hctz, lopressor, losartan, imdur. Stable.

## 2022-06-09 NOTE — Telephone Encounter (Signed)
Please see the MyChart message reply(ies) for my assessment and plan.    This patient gave consent for this Medical Advice Message and is aware that it may result in a bill to [employer], as well as the possibility of receiving a bill for a co-payment or deductible. They are an established patient, but are not seeking medical advice exclusively about a problem treated during an in person or video visit in the last seven days. I did not recommend an in person or video visit within seven days of my reply.    I spent a total of 5 minutes cumulative time within 7 days through [employer]. Reviewed prior notes and cardiac catheterization.  Candee Furbish, MD

## 2022-06-09 NOTE — Assessment & Plan Note (Signed)
Stable on bid pantoprazole and prn pepcid.  He continues to follow with GI.

## 2022-06-09 NOTE — Assessment & Plan Note (Signed)
Lab Results  Component Value Date   CHOL 102 03/23/2022   HDL 44 03/23/2022   LDLCALC 39 03/23/2022   LDLDIRECT 74.0 05/22/2021   TRIG 102 03/23/2022   CHOLHDL 2.3 03/23/2022   LDL at goal. Continue statin, fibrate/zetia.

## 2022-06-09 NOTE — Assessment & Plan Note (Signed)
Lab Results  Component Value Date   WBC 5.7 05/04/2022   HGB 12.0 (L) 05/04/2022   HCT 37.6 (L) 05/04/2022   MCV 90.0 05/04/2022   PLT 185 05/04/2022   Continues to follow at the cancer center or IV iron.

## 2022-06-09 NOTE — Assessment & Plan Note (Signed)
S/p stent placement last April- has follow up in March with cardiology. He completed plavix therapy and continues 79m once daily.

## 2022-06-09 NOTE — Assessment & Plan Note (Signed)
Lab Results  Component Value Date   HGBA1C 6 % 12/10/2021   HGBA1C 8.0 (H) 09/24/2020   HGBA1C 7.7 (H) 01/11/2020   Lab Results  Component Value Date   MICROALBUR 1.1 12/02/2015   LDLCALC 39 03/23/2022   CREATININE 1.21 02/17/2022

## 2022-06-09 NOTE — Assessment & Plan Note (Signed)
Continues Elavil.  Fair control.

## 2022-06-09 NOTE — Assessment & Plan Note (Signed)
Continues sublingual b12, 2x weekly.

## 2022-06-09 NOTE — Assessment & Plan Note (Signed)
Stable, no recent flares. Tries to stick to a low purine diet best he can.

## 2022-06-10 ENCOUNTER — Encounter: Payer: Self-pay | Admitting: Family

## 2022-06-10 LAB — BASIC METABOLIC PANEL
BUN: 21 mg/dL (ref 6–23)
CO2: 27 mEq/L (ref 19–32)
Calcium: 10 mg/dL (ref 8.4–10.5)
Chloride: 102 mEq/L (ref 96–112)
Creatinine, Ser: 1.29 mg/dL (ref 0.40–1.50)
GFR: 57.71 mL/min — ABNORMAL LOW (ref >60.00)
Glucose, Bld: 111 mg/dL — ABNORMAL HIGH (ref 70–99)
Potassium: 4.5 mEq/L (ref 3.5–5.1)
Sodium: 141 mEq/L (ref 135–145)

## 2022-06-10 LAB — VITAMIN B12: Vitamin B-12: >1500 pg/mL — ABNORMAL HIGH (ref 211–911)

## 2022-06-10 LAB — HEMOGLOBIN A1C: Hgb A1c MFr Bld: 6.6 % — ABNORMAL HIGH (ref 4.6–6.5)

## 2022-06-11 ENCOUNTER — Ambulatory Visit: Payer: Medicare Other | Admitting: Family Medicine

## 2022-06-11 LAB — MICROALBUMIN / CREATININE URINE RATIO
Creatinine,U: 150.3 mg/dL
Microalb Creat Ratio: 0.7 mg/g (ref 0.0–30.0)
Microalb, Ur: 1 mg/dL (ref 0.0–1.9)

## 2022-06-15 ENCOUNTER — Encounter: Payer: Self-pay | Admitting: Family

## 2022-06-29 ENCOUNTER — Inpatient Hospital Stay: Payer: Medicare Other | Attending: Hematology & Oncology

## 2022-06-29 ENCOUNTER — Other Ambulatory Visit: Payer: Self-pay | Admitting: Family Medicine

## 2022-06-29 DIAGNOSIS — D508 Other iron deficiency anemias: Secondary | ICD-10-CM | POA: Insufficient documentation

## 2022-06-29 DIAGNOSIS — D500 Iron deficiency anemia secondary to blood loss (chronic): Secondary | ICD-10-CM

## 2022-06-29 LAB — CBC WITH DIFFERENTIAL (CANCER CENTER ONLY)
Abs Immature Granulocytes: 0.02 10*3/uL (ref 0.00–0.07)
Basophils Absolute: 0 10*3/uL (ref 0.0–0.1)
Basophils Relative: 1 %
Eosinophils Absolute: 0.1 10*3/uL (ref 0.0–0.5)
Eosinophils Relative: 2 %
HCT: 34.3 % — ABNORMAL LOW (ref 39.0–52.0)
Hemoglobin: 11.2 g/dL — ABNORMAL LOW (ref 13.0–17.0)
Immature Granulocytes: 0 %
Lymphocytes Relative: 18 %
Lymphs Abs: 1 10*3/uL (ref 0.7–4.0)
MCH: 29.5 pg (ref 26.0–34.0)
MCHC: 32.7 g/dL (ref 30.0–36.0)
MCV: 90.3 fL (ref 80.0–100.0)
Monocytes Absolute: 0.5 10*3/uL (ref 0.1–1.0)
Monocytes Relative: 10 %
Neutro Abs: 3.7 10*3/uL (ref 1.7–7.7)
Neutrophils Relative %: 69 %
Platelet Count: 201 10*3/uL (ref 150–400)
RBC: 3.8 MIL/uL — ABNORMAL LOW (ref 4.22–5.81)
RDW: 13.8 % (ref 11.5–15.5)
WBC Count: 5.3 10*3/uL (ref 4.0–10.5)
nRBC: 0 % (ref 0.0–0.2)

## 2022-06-29 LAB — FERRITIN: Ferritin: 120 ng/mL (ref 24–336)

## 2022-06-30 LAB — IRON AND IRON BINDING CAPACITY (CC-WL,HP ONLY)
Iron: 76 ug/dL (ref 45–182)
Saturation Ratios: 21 % (ref 17.9–39.5)
TIBC: 370 ug/dL (ref 250–450)
UIBC: 294 ug/dL (ref 117–376)

## 2022-07-12 NOTE — Progress Notes (Addendum)
Office Visit    Patient Name: Brent [name] Date of Encounter: 07/12/2022  Primary Care Provider:  [name], MD Primary Cardiologist:  Candee Furbish, MD Primary Electrophysiologist: None  Chief Complaint    Brent [name] is a 67 y.o. male with PMH of CAD s/p DES to RCA, DM type II, OSA (on CPAP), GI bleed, HTN, HLD, morbid obesity s/p gastric bypass 2012, anemia, IDA who presents today for 81-month follow-up of coronary artery disease.  Past Medical History    Past Medical History:  Diagnosis Date   Acid reflux disease    ACID REFLUX DISEASE 07/05/2007   Acute gastric ulcer with bleeding    Alcohol abuse    Anemia    Atherosclerosis    Benign neoplasm of colon 07/05/2007   Bilateral hip pain 10/05/2016   Bladder cancer ([place])    Breast pain, left 11/17/2011   CAD (coronary artery disease)    Chest pain    a. Reportedly negative dobut echo performed prior to gastric bypass in 03/2011;  b. 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 12/2011 Cath: LM nl, LAD 50p, D1 62m, LCX min irregs, OM3 30, RCA 25p, 53m (FFR 0.99->0.89), PDA 30, EF 65%, Med Rx. d. Cath 11/2021 - 90% m RCA s/p PCI/DES x1   COLONIC POLYPS, HX OF 10/29/2008   Diarrhea 06/13/2010   Diverticulosis 07/05/2018   DIVERTICULOSIS, COLON 10/29/2008   DM (diabetes mellitus), type 2, uncontrolled    GI bleed    Gout 03/04/2013   Hearing loss 05/24/2013   Previous audiology evaluation completely.   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   Neck pain 03/2015   Other and unspecified hyperlipidemia 11/16/2012   Post-operative nausea and vomiting    after the surgery in hospital in March 2020/ past the cauderization   Preventative health care 11/17/2011   Renal  stone 11/2013   Sleep apnea    a. CPAP   Spinal stenosis    Tear of meniscus of left knee 2012   Past Surgical History:  Procedure Laterality Date   ANKLE SURGERY Right 1994   BICEPS TENDON REPAIR     left side   CARDIAC CATHETERIZATION     denies any chest pain in the past 2 years   COLONOSCOPY     colonoscopy polyps     CORONARY STENT INTERVENTION N/A 11/28/2021   Procedure: CORONARY STENT INTERVENTION;  Surgeon: Martinique, Peter M, MD;  Location: [place] [facility];  Service: Cardiovascular;  Laterality: N/A;   ESOPHAGOGASTRODUODENOSCOPY N/A 03/27/2013   Procedure: ESOPHAGOGASTRODUODENOSCOPY (EGD);  Surgeon: Irene Shipper, MD;  Location: [facility];  Service: Endoscopy;  Laterality: N/A;   ESOPHAGOGASTRODUODENOSCOPY (EGD) WITH PROPOFOL N/A 07/06/2018   Procedure: ESOPHAGOGASTRODUODENOSCOPY (EGD) WITH PROPOFOL;  Surgeon: Jerene Bears, MD;  Location: [facility];  Service: Gastroenterology;  Laterality: N/A;   ESOPHAGOGASTRODUODENOSCOPY (EGD) WITH PROPOFOL N/A 02/05/2022   Procedure: ESOPHAGOGASTRODUODENOSCOPY (EGD) WITH PROPOFOL;  Surgeon: Lavena Bullion, DO;  Location: [facility];  Service: Gastroenterology;  Laterality: N/A;   GASTRIC BYPASS     HEMOSTASIS CLIP PLACEMENT  02/05/2022   Procedure: HEMOSTASIS CLIP PLACEMENT;  Surgeon: Lavena Bullion, DO;  Location: [facility];  Service:  Gastroenterology;;   HERNIA REPAIR     HOT HEMOSTASIS N/A 07/06/2018   Procedure: HOT HEMOSTASIS (ARGON PLASMA COAGULATION/BICAP);  Surgeon: Jerene Bears, MD;  Location: [facility];  Service: Gastroenterology;  Laterality: N/A;   KNEE ARTHROSCOPY Left 11/06/2010   Left, torn meniscus (repaired)   LEFT HEART CATH AND CORONARY ANGIOGRAPHY N/A 11/28/2021   Procedure: LEFT HEART CATH AND CORONARY ANGIOGRAPHY;  Surgeon: Martinique, Peter M, MD;  Location: [place] [facility];  Service: Cardiovascular;  Laterality: N/A;   LEFT HEART CATHETERIZATION WITH CORONARY ANGIOGRAM N/A 12/23/2011   Procedure:  LEFT HEART CATHETERIZATION WITH CORONARY ANGIOGRAM;  Surgeon: Minus Breeding, MD;  Location: [facility];  Service: Cardiovascular;  Laterality: N/A;   REPLACEMENT TOTAL KNEE Right 01/2016   removed scar tissue/ cut tip of nerve bundle and re-route   right knee arthroscopy Right 07/05/14   Dr. Hart Robinsons, [place].   ROTATOR CUFF REPAIR  2019   left   SCHLEROTHERAPY  07/06/2018   Procedure: SCHLEROTHERAPY;  Surgeon: Jerene Bears, MD;  Location: [facility];  Service: Gastroenterology;;   Clide Deutscher  02/05/2022   Procedure: Clide Deutscher;  Surgeon: Lavena Bullion, DO;  Location: [facility];  Service: Gastroenterology;;   TONSILLECTOMY  age 89   TONSILLECTOMY     as a child   TOTAL KNEE ARTHROPLASTY Right 01/20/2016   Procedure: RIGHT TOTAL KNEE ARTHROPLASTY;  Surgeon: Paralee Cancel, MD;  Location: [facility];  Service: Orthopedics;  Laterality: Right;   TRANSURETHRAL RESECTION OF BLADDER TUMOR N/A 09/24/2020   Procedure: TRANSURETHRAL RESECTION OF BLADDER TUMOR (TURBT)  RIGHT retrograde pylegram;  Surgeon: [name], MD;  Location: [facility];  Service: Urology;  Laterality: N/A;   UPPER GI ENDOSCOPY  03/27/13    Allergies  Allergies  Allergen Reactions   Bee Venom Anaphylaxis   Lipitor [Atorvastatin] Other (See Comments)    Myalgias, memory changes   Morphine Other (See Comments)    hyperactive   Metformin And Related Other (See Comments)    Myalgias and weakness   Lioresal [Baclofen] Other (See Comments)    Acted drunk   Medrol [Methylprednisolone] Other (See Comments)    Hyperglycemia    Zocor [Simvastatin] Other (See Comments)    Joint pain   Hydrocodone Itching   Latex Rash   Pravachol [Pravastatin] Other (See Comments)    Joint pain    History of Present Illness    Brent [name]  is a 67 year old male with the above mention past medical history who presents today for 71-month follow-up of nonobstructive CAD and hypertension.  Brent [name] was seen initially  in 2013 for ED visit and complained of chest pain.  Patient endorsed 7 out of 10 left upper chest pain and shoulder aching.  He underwent a LHC that revealed diffuse nonobstructive CAD by FFR with moderate stenosis in mid RCA but not flow-limiting.  He was placed on optimize GDMT.  He had a Lexiscan completed in 2016 that was low risk with normal EF.  He developed palpitations and 2/thousand 21 and wore an event monitor that revealed sinus rhythm with no evidence of AF or pauses.  He underwent Lexiscan 07/2021 that was low risk with EF of 62%.  He was seen by Finis Bud, NP on 11/24/2021 with complaint of worsening shortness of breath with exertion and extreme fatigue.  LHC was recommended and patient underwent cath that revealed single-vessel obstructive CAD in the mid RCA with successful PCI/DES x 1.  He was  placed on DAPT with ASA/Plavix for 6 months.  2D echo was completed showing EF of 55-60% with mild AI and mild MR.  He was seen in follow-up on 12/11/2021 with ongoing dyspnea and chest discomfort.  He reported resolution with nitroglycerin at that time.  He presented to the ED 01/07/2022 after experiencing chest discomfort that did not improve with 4 doses of nitroglycerin.  EKG and chest x-ray completed with normal troponins.  Patient was consulted by cardiology who recommended increasing Imdur to 60 mg daily.  He discontinued Plavix on 05/2022 and is currently on ASA 81 mg alone.  Brent [name] presents today for 10-month follow-up alone.  Since last being seen in the office patient reports that he is been experiencing some stable angina that has occurred over the last few weeks.  The last episode occurred 3 days ago and has been relieved with as needed nitroglycerin.  He denies any crushing chest pain or increased palpitations with these episodes.  He is compliant with his current medications and denies any adverse reactions.  His blood pressure today is 110/60 and heart rate is 73 bpm.  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  Patient deniespalpitations, dyspnea, PND, orthopnea, nausea, vomiting, dizziness, syncope, edema, weight gain, or early satiety.   Home Medications    Current Outpatient Medications  Medication Sig Dispense Refill   acetaminophen (TYLENOL) 500 MG tablet Take 1,000 mg by mouth every 6 (six) hours as needed for moderate pain or headache.     amitriptyline (ELAVIL) 50 MG tablet TAKE 1 TABLET AT BEDTIME 90 tablet 3   amoxicillin (AMOXIL) 500 MG capsule Take 2,000 mg by mouth See admin instructions. Take 4 capsules (2,000 mg) prior to dental appointments     aspirin EC 81 MG tablet Take 1 tablet (81 mg total) by mouth daily. Swallow whole. 30 tablet 12   Cholecalciferol (VITAMIN D) 50 MCG (2000 UT) tablet Take 6,000 Units by mouth daily.     Cyanocobalamin (VITAMIN B-12) 1000 MCG SUBL Place 1,000 mcg under the tongue 2 (two) times a week.     EPINEPHrine 0.3 mg/0.3 mL IJ SOAJ injection INJECT 0.3 MLS (0.3 MG TOTAL) INTO THE MUSCLE ONCE FOR 1 DOSE 2 each 11   ezetimibe (ZETIA) 10 MG tablet Take 1 tablet (10 mg total) by mouth daily. 90 tablet 3   famotidine (PEPCID) 40 MG tablet TAKE 1 TABLET AT BEDTIME AS NEEDED FOR HEARTBURN OR INDIGESTION 90 tablet 3   fenofibrate 160 MG tablet Take 1 tablet (160 mg total) by mouth daily. 90 tablet 1   Ferrous Gluconate-C-Folic Acid (IRON-C PO) Take 1 tablet by mouth daily. Iron 65 mg and vit C-125 mg     fluticasone (FLONASE) 50 MCG/ACT nasal spray USE 2 SPRAYS IN EACH NOSTRIL DAILY 48 g 3   gabapentin (NEURONTIN) 300 MG capsule TAKE 1 CAPSULE TWICE A DAY AND 2 CAPSULES AT BEDTIME (Patient taking differently: Take 300-900 mg by mouth See admin instructions. Take 1 capsule (300 mg) by mouth in the morning and at noon then take 3 capsules (900 mg) in the afternoon and at bedtime)  360 capsule 1   hydrochlorothiazide (MICROZIDE) 12.5 MG capsule TAKE 1 CAPSULE DAILY 90 capsule 2   isosorbide mononitrate (IMDUR) 30 MG 24 hr tablet Take 2 tablets (60 mg total) by  mouth daily. 180 tablet 3   losartan (COZAAR) 50 MG tablet TAKE 1 TABLET DAILY (KEEP UPCOMING APPOINTMENT IN MARCH 2023 WITH CARDIOLOGIST BEFORE ANYMORE REFILLS) (Patient taking differently: Take 50 mg by mouth daily.) 90 tablet 3   Magnesium 100 MG CAPS Take 1 capsule (100 mg total) by mouth daily. 30 capsule 1   methocarbamol (ROBAXIN) 500 MG tablet Take 1 tablet (500 mg total) by mouth 4 (four) times daily. 360 tablet 1   metoprolol tartrate (LOPRESSOR) 100 MG tablet Take 1 tablet (100 mg total) by mouth 2 (two) times daily. 180 tablet 3   mometasone (ELOCON) 0.1 % cream Apply 1 Application topically daily. 90 g 1   Multiple Vitamin (MULTIVITAMIN) tablet Take 1 tablet by mouth daily.     nitroGLYCERIN (NITROSTAT) 0.4 MG SL tablet Place 1 tablet (0.4 mg total) under the tongue every 5 (five) minutes as needed. 25 tablet 11   oxycodone (OXY-IR) 5 MG capsule Take 5 mg by mouth 3 (three) times daily as needed for pain.     pantoprazole (PROTONIX) 40 MG tablet TAKE 1 TABLET TWICE A DAY BEFORE MEALS (Patient taking differently: Take 40 mg by mouth 2 (two) times daily before a meal.) 180 tablet 3   Pitavastatin Calcium (LIVALO) 2 MG TABS Take 1 tablet (2 mg total) by mouth daily. 90 tablet 3   No current facility-administered medications for this visit.     Review of Systems  Please see the history of present illness.    (+) Chest pain (+) Knee pain  All other systems reviewed and are otherwise negative except as noted above.  Physical Exam    Wt Readings from Last 3 Encounters:  06/09/22 252 lb (114.3 kg)  05/12/22 256 lb 12.8 oz (116.5 kg)  04/23/22 260 lb (117.9 kg)   [id number] were no vitals filed for this visit.,There is no height or weight on file to calculate BMI.  Constitutional:      Appearance:  Healthy appearance. Not in distress.  Neck:     Vascular: JVD normal.  Pulmonary:     Effort: Pulmonary effort is normal.     Breath sounds: No wheezing. No rales. Diminished in the bases Cardiovascular:     Normal rate. Regular rhythm. Normal S1. Normal S2.      Murmurs: There is no murmur.  Edema:    Peripheral edema absent.  Abdominal:     Palpations: Abdomen is soft non tender. There is no hepatomegaly.  Skin:    General: Skin is warm and dry.  Neurological:     General: No focal deficit present.     Mental Status: Alert and oriented to person, place and time.     Cranial Nerves: Cranial nerves are intact.  EKG/LABS/ Recent Cardiac Studies    ECG personally reviewed by me today -none completed today   Lab Results  Component Value Date   WBC 5.3 06/29/2022   HGB 11.2 (L) 06/29/2022   HCT 34.3 (L) 06/29/2022   MCV 90.3 06/29/2022   PLT 201 06/29/2022   Lab Results  Component Value Date   CREATININE 1.29 06/09/2022   BUN 21 06/09/2022   NA 141 06/09/2022   K 4.5 06/09/2022   CL 102 06/09/2022   CO2 27 06/09/2022   Lab Results  Component Value Date   ALT 16 03/23/2022   AST 29 03/23/2022   ALKPHOS 39 (L) 03/23/2022   BILITOT 0.3 03/23/2022   Lab Results  Component Value Date  CHOL 102 03/23/2022   HDL 44 03/23/2022   LDLCALC 39 03/23/2022   LDLDIRECT 74.0 05/22/2021   TRIG 102 03/23/2022   CHOLHDL 2.3 03/23/2022    Lab Results  Component Value Date   HGBA1C 6.6 (H) 06/09/2022    Cardiac Studies & Procedures   CARDIAC CATHETERIZATION  CARDIAC CATHETERIZATION 11/28/2021  Narrative   Mid LAD lesion is 40% stenosed.   1st Diag lesion is 40% stenosed.   Mid Cx to Dist Cx lesion is 40% stenosed.   Prox RCA to Mid RCA lesion is 90% stenosed.   A drug-eluting stent was successfully placed using a SYNERGY XD 4.0X16.   Post intervention, there is a 0% residual stenosis.   The left ventricular systolic function is normal.   LV end diastolic pressure is  normal.   The left ventricular ejection fraction is 55-65% by visual estimate.  Single vessel obstructive CAD involving the mid RCA Normal LV function Normal LVEDP Successful PCI of the mid RCA with DES x 1   Plan: DAPT for 6 months. Anticipate same day DC.  Findings Coronary Findings Diagnostic  Dominance: Right  Left Main Vessel was injected. Vessel is normal in caliber. Vessel is angiographically normal.  Left Anterior Descending Mid LAD lesion is 40% stenosed. The lesion is moderately calcified.  First Diagonal Branch 1st Diag lesion is 40% stenosed.  Left Circumflex Mid Cx to Dist Cx lesion is 40% stenosed.  First Obtuse Marginal Branch  Second Obtuse Marginal Branch Vessel is small in size.  Right Coronary Artery Prox RCA to Mid RCA lesion is 90% stenosed. The lesion is moderately calcified.  Intervention  Prox RCA to Mid RCA lesion Stent CATHETER LAUNCHER 6FR AL1 guide catheter was inserted. Lesion crossed with guidewire using a WIRE ASAHI PROWATER 180CM. Pre-stent angioplasty was performed using a BALLN SAPPHIRE 2.5X12. A drug-eluting stent was successfully placed using a SYNERGY XD 4.0X16. Stent strut is well apposed. Post-stent angioplasty was performed using a BALL SAPPHIRE NC24 4.5X8. Maximum pressure:  20 atm. Post-Intervention Lesion Assessment The intervention was successful. Pre-interventional TIMI flow is 3. Post-intervention TIMI flow is 3. No complications occurred at this lesion. There is a 0% residual stenosis post intervention.   STRESS TESTS  MYOCARDIAL PERFUSION IMAGING 07/25/2021  Narrative   The study is normal. The study is low risk.   No ST deviation was noted.   Left ventricular function is normal. Nuclear stress EF: 62 %. The left ventricular ejection fraction is normal (55-65%). End diastolic cavity size is normal.   Prior study available for comparison from 05/25/2014.   ECHOCARDIOGRAM  ECHOCARDIOGRAM COMPLETE  12/08/2021  Narrative ECHOCARDIOGRAM REPORT    Patient Name:   ZACHARAY DRAGONE Date of Exam: 12/08/2021 Medical Rec #:  [id number]       Height:       68.0 in Accession #:    [id number]      Weight:       255.0 lb Date of Birth:  May 01, 1955       BSA:          2.266 m Patient Age:    66 years        BP:           120/82 mmHg Patient Gender: M               HR:           87 bpm. Exam Location:  [address]  Procedure: 2D Echo, Cardiac Doppler, Color  Doppler and Intracardiac Opacification Agent  Indications:    R06.00 Dyspnea  History:        Patient has prior history of Echocardiogram examinations, most recent 12/22/2011. CAD, Signs/Symptoms:Chest Pain; Risk Factors:Hypertension, Diabetes, Dyslipidemia and Sleep Apnea. Dyspnea on exertion. Morbid obesity. Anemia.  Sonographer:    Cathie Beams RCS Referring Phys: Bryon Lions BLYTH  IMPRESSIONS   1. Left ventricular ejection fraction, by estimation, is 55 to 60%. The left ventricle has normal function. The left ventricle has no regional wall motion abnormalities. Left ventricular diastolic parameters are indeterminate. 2. Right ventricular systolic function is normal. The right ventricular size is normal. There is normal pulmonary artery systolic pressure. The estimated right ventricular systolic pressure is 31.5 mmHg. 3. The mitral valve is grossly normal. Mild mitral valve regurgitation. No evidence of mitral stenosis. 4. The aortic valve is tricuspid. There is mild calcification of the aortic valve. Aortic valve regurgitation is mild. Aortic valve sclerosis is present, with no evidence of aortic valve stenosis. 5. The inferior vena cava is normal in size with greater than 50% respiratory variability, suggesting right atrial pressure of 3 mmHg.  FINDINGS Left Ventricle: Left ventricular ejection fraction, by estimation, is 55 to 60%. The left ventricle has normal function. The left ventricle has no regional wall motion abnormalities.  Definity contrast agent was given IV to delineate the left ventricular endocardial borders. The left ventricular internal cavity size was normal in size. There is no left ventricular hypertrophy. Left ventricular diastolic parameters are indeterminate.  Right Ventricle: The right ventricular size is normal. No increase in right ventricular wall thickness. Right ventricular systolic function is normal. There is normal pulmonary artery systolic pressure. The tricuspid regurgitant velocity is 2.67 m/s, and with an assumed right atrial pressure of 3 mmHg, the estimated right ventricular systolic pressure is 31.5 mmHg.  Left Atrium: Left atrial size was normal in size.  Right Atrium: Right atrial size was normal in size.  Pericardium: There is no evidence of pericardial effusion.  Mitral Valve: The mitral valve is grossly normal. Mild mitral valve regurgitation. No evidence of mitral valve stenosis.  Tricuspid Valve: The tricuspid valve is grossly normal. Tricuspid valve regurgitation is trivial. No evidence of tricuspid stenosis.  Aortic Valve: The aortic valve is tricuspid. There is mild calcification of the aortic valve. Aortic valve regurgitation is mild. Aortic regurgitation PHT measures 684 msec. Aortic valve sclerosis is present, with no evidence of aortic valve stenosis.  Pulmonic Valve: The pulmonic valve was grossly normal. Pulmonic valve regurgitation is trivial. No evidence of pulmonic stenosis.  Aorta: The aortic root and ascending aorta are structurally normal, with no evidence of dilitation.  Venous: The inferior vena cava is normal in size with greater than 50% respiratory variability, suggesting right atrial pressure of 3 mmHg.  IAS/Shunts: The atrial septum is grossly normal.   LEFT VENTRICLE PLAX 2D LVIDd:         4.90 cm   Diastology LVIDs:         3.30 cm   LV e' medial:    6.37 cm/s LV PW:         1.00 cm   LV E/e' medial:  18.7 LV IVS:        0.90 cm   LV e' lateral:    7.81 cm/s LVOT diam:     2.00 cm   LV E/e' lateral: 15.2 LV SV:         59 LV SV Index:   26 LVOT Area:  3.14 cm   RIGHT VENTRICLE RV Basal diam:  3.40 cm RV S prime:     20.20 cm/s TAPSE (M-mode): 2.0 cm RVSP:           31.5 mmHg  LEFT ATRIUM             Index        RIGHT ATRIUM           Index LA diam:        4.20 cm 1.85 cm/m   RA Pressure: 3.00 mmHg LA Vol (A2C):   75.4 ml 33.27 ml/m  RA Area:     18.50 cm LA Vol (A4C):   59.4 ml 26.21 ml/m  RA Volume:   41.70 ml  18.40 ml/m LA Biplane Vol: 73.3 ml 32.35 ml/m AORTIC VALVE LVOT Vmax:   84.60 cm/s LVOT Vmean:  55.500 cm/s LVOT VTI:    0.189 m AI PHT:      684 msec  AORTA Ao Root diam: 3.40 cm Ao Asc diam:  3.90 cm  MITRAL VALVE                TRICUSPID VALVE MV Area (PHT): 4.57 cm     TR Peak grad:   28.5 mmHg MV Decel Time: 166 msec     TR Vmax:        267.00 cm/s MV E velocity: 119.00 cm/s  Estimated RAP:  3.00 mmHg MV A velocity: 79.10 cm/s   RVSP:           31.5 mmHg MV E/A ratio:  1.50 SHUNTS Systemic VTI:  0.19 m Systemic Diam: 2.00 cm  Lennie Odor MD Electronically signed by Lennie Odor MD Signature Date/Time: 12/08/2021/3:33:31 PM    Final    MONITORS  LONG TERM MONITOR (3-14 DAYS) 07/11/2019  Narrative  Sinus rhythm average HR 81 BPM  Rare PVC's - occasionally symptomatic/ triggered  Rare episode of PAT, paroxysmal atrial tachycardia (4-12 beats) - benign  No pauses  No atrial fibrillation  Donato Schultz, MD   CT SCANS  CT CORONARY MORPH W/CTA COR W/SCORE 12/24/2011  Addendum 12/24/2011  1:01 PM **ADDENDUM** CREATED: 12/24/2011 12:54:53  OVER-READ INTERPRETATION - CT CHEST  The following report is an over-read performed by radiologist Dr. Ronaldo Miyamoto D. Reche Dixon, M.D. of [facility] on 12/24/2011 12:54:53.  This over-read does not include interpretation of cardiac or coronary anatomy or pathology.  The CTA interpretation by the cardiologist is attached.  Comparison:   Plain film chest of 12/21/2011.  Findings: Lung windows demonstrate right base subsegmental atelectasis.  Soft tissue windows demonstrate bilateral gynecomastia, moderate on the left and mild on the right. No central pulmonary embolism, on this non-dedicated study.  No imaged thoracic adenopathy.  Limited abdominal imaging demonstrates no significant findings. No acute osseous abnormality.  IMPRESSION:  1.  Bilateral gynecomastia. 2.  Otherwise, no significant extracardiac findings.  **END ADDENDUM** SIGNED BY: Karn Cassis. Reche Dixon, M.D.  Narrative *RADIOLOGY REPORT*  CARDIAC CTA WITH CALCIUM SCORE 12/22/2011 13:07:00  Ordering Physician: Orlie Dakin  Reading Physician: DaltonS.Mclean  Protocol:  The patient scanned on a Philips 256 slice scanner. Prospective gating with 5% phase tolerance and 120 kV was used. 2.5 mg IV of Lopressor was administered.  Average heart rate during the scan was 66 beats per minute (HR rose after nitroglycerin administration).  After an initial AP and lateral topogram, 3 mm axial slices were performed through the heart for calcium scoring. The patient then had an 80 ml of contrast  given for coronary CTA with bolus tracking in the descending thoracic aorta.  The 3-D data set was then sent to the [employer].  Reconstructions were done using MIP,MPR and VRT modes.  Indications: Chest pain, CAD risk factors, prior normal stress echo  DETAILED FINDINGS:  Quality of Study: Fair, some degradation by artifact in the RCA territory  Left Main: No significant plaque or stenosis.  Left Anterior Descending: There was extensive mixed plaque in the proximal to mid LAD starting at the take-off of the first diagonal. Stenosis appeared mild to at most moderate (likely </= 50%).  There was a moderate D1 with calcified plaque and at most mild stenosis.  Left Circumflex: There was calcified plaque with probably mild stenosis in the distal  LCx.  Right Coronary Artery: Dominant vessel.  There was extensive mixed plaque in the mid to distal RCA. There appeared to be an area of artifact obscuring interpretation of the mid RCA but cannot rule out significant stenosis.  Coronary Calcium Score: 1014 Agatston units  Aorta: The aortic root was normal in caliber.  IMPRESSION: 1. Extensive mixed plaque in the proximal to mid LAD with mild to at most moderate (likely < 50%) stenosis.  2. Extensive mixed plaque in the mid to distal RCA.  There was probably artifact obscuring the mid RCA but cannot rule out significant stenosis at that site.  3. Coronary artery calcium score of 1014 Agatston units, placing the patient in the 95 percentile for age and gender.  This suggests high risk of future cardiac events.  An absolute coronary artery calcium score > 400 suggests high risk of obstructive CAD.  4. With the extensive plaque and question of significant mid RCA lesion, would suggest cardiac catheterization.   Original Report Authenticated By: Consuello Bossier, M.D.          Assessment & Plan    1.  History of CAD/stable angina: -s/p LHC completed 11/2021 with single-vessel CAD and DES/PTCA placed in RCA. -Patient recently discontinued Plavix and is currently on ASA 81 mg alone. -Today patient reports episodes of stable angina that have required as needed nitroglycerin. -We will send patient for a PET stress test for further evaluation and to rule out possible ischemia -Patient is currently on Imdur 60 mg and will add an additional 30 mg as needed for increased chest pain. -GDMT with ASA 81 mg, Zetia 10 mg, fenofibrate 160 mg, Lopressor 100 mg twice daily  2.  Essential hypertension: -Patient's blood pressure today was well-controlled at 110/60 -Continue HCTZ 12.5 mg, losartan 50 mg, metoprolol 100 mg twice daily  3.  Hyperlipidemia: -Patient's last LDL cholesterol was well-controlled at 39 -Continue fenofibrate and Zetia  as noted above  4.  Obstructive sleep apnea: -Patient reports compliance with CPAP machine  5.  History of morbid obesity: -s/p gastric bypass in 2012 -Current BMI is 38.16  6.  Preoperative clearance: -Patient's RCRI is 0.9% -Patient was able to complete greater than 4 METS of activity and recently underwent cardiac PET stress test for chest pain that revealed no evidence of ischemia and was considered low risk. -He is fine to proceed with scheduled procedure at this time.   Disposition: Follow-up with Donato Schultz, MD or APP in 3 months Shared Decision Making/Informed Consent The risks [chest pain, shortness of breath, cardiac arrhythmias, dizziness, blood pressure fluctuations, myocardial infarction, stroke/transient ischemic attack, nausea, vomiting, allergic reaction, radiation exposure, metallic taste sensation and life-threatening complications (estimated to be 1 in 10,000)], benefits (risk  stratification, diagnosing coronary artery disease, treatment guidance) and alternatives of a cardiac PET stress test were discussed in detail with Brent [name] and he agrees to proceed.   Medication Adjustments/Labs and Tests Ordered: Current medicines are reviewed at length with the patient today.  Concerns regarding medicines are outlined above.   Signed, Napoleon Form, Leodis Rains, NP 07/12/2022, 8:57 AM [facility]  Note:  This document was prepared using Dragon voice recognition software and may include unintentional dictation errors.

## 2022-07-13 ENCOUNTER — Ambulatory Visit: Payer: Medicare Other | Attending: Nurse Practitioner | Admitting: Nurse Practitioner

## 2022-07-13 ENCOUNTER — Encounter: Payer: Self-pay | Admitting: Nurse Practitioner

## 2022-07-13 VITALS — BP 110/60 | HR 73 | Ht 68.0 in | Wt 251.0 lb

## 2022-07-13 DIAGNOSIS — E782 Mixed hyperlipidemia: Secondary | ICD-10-CM

## 2022-07-13 DIAGNOSIS — Z9884 Bariatric surgery status: Secondary | ICD-10-CM

## 2022-07-13 DIAGNOSIS — I2510 Atherosclerotic heart disease of native coronary artery without angina pectoris: Secondary | ICD-10-CM

## 2022-07-13 DIAGNOSIS — G4733 Obstructive sleep apnea (adult) (pediatric): Secondary | ICD-10-CM

## 2022-07-13 DIAGNOSIS — I10 Essential (primary) hypertension: Secondary | ICD-10-CM

## 2022-07-13 DIAGNOSIS — I25118 Atherosclerotic heart disease of native coronary artery with other forms of angina pectoris: Secondary | ICD-10-CM | POA: Diagnosis not present

## 2022-07-13 DIAGNOSIS — I2089 Other forms of angina pectoris: Secondary | ICD-10-CM

## 2022-07-13 MED ORDER — ISOSORBIDE MONONITRATE ER 30 MG PO TB24: 60.0000 mg | ORAL_TABLET | Freq: Every day | ORAL | 2 refills | Status: DC

## 2022-07-13 NOTE — Patient Instructions (Addendum)
Medication Instructions:  Can take an additional 30mg  of Imdur as needed *If you need a refill on your cardiac medications before your next appointment, please call your pharmacy*   Lab Work: None ordered   Testing/Procedures: CARDIAC PET STRESS TEST   Follow-Up: At [facility], you and your health needs are our priority.  As part of our continuing mission to provide you with exceptional heart care, we have created designated Provider Care Teams.  These Care Teams include your primary Cardiologist (physician) and Advanced Practice Providers (APPs -  Physician Assistants and Nurse Practitioners) who all work together to provide you with the care you need, when you need it.  We recommend signing up for the patient portal called "MyChart".  Sign up information is provided on this After Visit Summary.  MyChart is used to connect with patients for Virtual Visits (Telemedicine).  Patients are able to view lab/test results, encounter notes, upcoming appointments, etc.  Non-urgent messages can be sent to your provider as well.   To learn more about what you can do with MyChart, go to [email or web address].    Your next appointment:   6 month(s)  Provider:   Candee Furbish, MD  or Ambrose Pancoast, NP   Other Instructions  How to Prepare for Your Cardiac PET/CT Stress Test:  1. Please do not take these medications before your test:   Medications that may interfere with the cardiac pharmacological stress agent (ex. nitrates - including erectile dysfunction medications, isosorbide mononitrate, tamulosin or beta-blockers) the day of the exam. (Erectile dysfunction medication should be held for at least 72 hrs prior to test) Theophylline containing medications for 12 hours. Dipyridamole 48 hours prior to the test. Your remaining medications may be taken with water.  2. Nothing to eat or drink, except water, 3 hours prior to arrival time.   NO caffeine/decaffeinated products, or chocolate  12 hours prior to arrival.  3. NO perfume, cologne or lotion  4. Total time is 1 to 2 hours; you may want to bring reading material for the waiting time.  5. Please report to Radiology at the [facility] Main Entrance 30 minutes early for your test.  [place], [place] [id number]  Diabetic Preparation:  Hold oral medications. You may take NPH and Lantus insulin. Do not take Humalog or Humulin R (Regular Insulin) the day of your test. Check blood sugars prior to leaving the house. If able to eat breakfast prior to 3 hour fasting, you may take all medications, including your insulin, Do not worry if you miss your breakfast dose of insulin - start at your next meal.  IF YOU THINK YOU MAY BE PREGNANT, OR ARE NURSING PLEASE INFORM THE TECHNOLOGIST.  In preparation for your appointment, medication and supplies will be purchased.  Appointment availability is limited, so if you need to cancel or reschedule, please call the Radiology Department at [phone]  24 hours in advance to avoid a cancellation fee of $100.00  What to Expect After you Arrive:  Once you arrive and check in for your appointment, you will be taken to a preparation room within the Radiology Department.  A technologist or Nurse will obtain your medical history, verify that you are correctly prepped for the exam, and explain the procedure.  Afterwards,  an IV will be started in your arm and electrodes will be placed on your skin for EKG monitoring during the stress portion of the exam. Then you will be escorted to the  PET/CT scanner.  There, staff will get you positioned on the scanner and obtain a blood pressure and EKG.  During the exam, you will continue to be connected to the EKG and blood pressure machines.  A small, safe amount of a radioactive tracer will be injected in your IV to obtain a series of pictures of your heart along with an injection of a stress agent.    After your Exam:  It is  recommended that you eat a meal and drink a caffeinated beverage to counter act any effects of the stress agent.  Drink plenty of fluids for the remainder of the day and urinate frequently for the first couple of hours after the exam.  Your doctor will inform you of your test results within 7-10 business days.  For questions about your test or how to prepare for your test, please call: Marchia Bond, Cardiac Imaging Nurse Navigator  Gordy Clement, Cardiac Imaging Nurse Navigator Office: [phone]

## 2022-07-20 ENCOUNTER — Other Ambulatory Visit: Payer: Self-pay | Admitting: Family Medicine

## 2022-07-20 ENCOUNTER — Encounter: Payer: Self-pay | Admitting: Family Medicine

## 2022-07-20 MED ORDER — METHYLPREDNISOLONE 4 MG PO TBPK: ORAL_TABLET | ORAL | 0 refills | Status: DC

## 2022-07-24 ENCOUNTER — Other Ambulatory Visit: Payer: Self-pay | Admitting: Family Medicine

## 2022-07-30 DIAGNOSIS — M542 Cervicalgia: Secondary | ICD-10-CM | POA: Diagnosis not present

## 2022-07-30 DIAGNOSIS — Z5181 Encounter for therapeutic drug level monitoring: Secondary | ICD-10-CM | POA: Diagnosis not present

## 2022-07-30 DIAGNOSIS — G894 Chronic pain syndrome: Secondary | ICD-10-CM | POA: Diagnosis not present

## 2022-07-30 DIAGNOSIS — Z79899 Other long term (current) drug therapy: Secondary | ICD-10-CM | POA: Diagnosis not present

## 2022-07-30 DIAGNOSIS — Z9689 Presence of other specified functional implants: Secondary | ICD-10-CM | POA: Diagnosis not present

## 2022-07-30 DIAGNOSIS — C672 Malignant neoplasm of lateral wall of bladder: Secondary | ICD-10-CM | POA: Diagnosis not present

## 2022-07-30 DIAGNOSIS — M5412 Radiculopathy, cervical region: Secondary | ICD-10-CM | POA: Diagnosis not present

## 2022-07-30 DIAGNOSIS — M5414 Radiculopathy, thoracic region: Secondary | ICD-10-CM | POA: Diagnosis not present

## 2022-08-03 ENCOUNTER — Other Ambulatory Visit: Payer: Self-pay | Admitting: Urology

## 2022-08-03 ENCOUNTER — Other Ambulatory Visit: Payer: Self-pay | Admitting: Cardiology

## 2022-08-06 DIAGNOSIS — M5030 Other cervical disc degeneration, unspecified cervical region: Secondary | ICD-10-CM | POA: Diagnosis not present

## 2022-08-06 DIAGNOSIS — M48061 Spinal stenosis, lumbar region without neurogenic claudication: Secondary | ICD-10-CM | POA: Diagnosis not present

## 2022-08-06 DIAGNOSIS — M5136 Other intervertebral disc degeneration, lumbar region: Secondary | ICD-10-CM | POA: Diagnosis not present

## 2022-08-06 DIAGNOSIS — Z79891 Long term (current) use of opiate analgesic: Secondary | ICD-10-CM | POA: Diagnosis not present

## 2022-08-06 DIAGNOSIS — Z79899 Other long term (current) drug therapy: Secondary | ICD-10-CM | POA: Diagnosis not present

## 2022-08-06 DIAGNOSIS — M545 Low back pain, unspecified: Secondary | ICD-10-CM | POA: Diagnosis not present

## 2022-08-06 DIAGNOSIS — Z96651 Presence of right artificial knee joint: Secondary | ICD-10-CM | POA: Diagnosis not present

## 2022-08-06 DIAGNOSIS — M542 Cervicalgia: Secondary | ICD-10-CM | POA: Diagnosis not present

## 2022-08-06 DIAGNOSIS — Z5181 Encounter for therapeutic drug level monitoring: Secondary | ICD-10-CM | POA: Diagnosis not present

## 2022-08-06 DIAGNOSIS — M546 Pain in thoracic spine: Secondary | ICD-10-CM | POA: Diagnosis not present

## 2022-08-23 ENCOUNTER — Telehealth: Payer: Medicare Other | Admitting: Family Medicine

## 2022-08-23 DIAGNOSIS — R509 Fever, unspecified: Secondary | ICD-10-CM

## 2022-08-23 NOTE — Patient Instructions (Signed)
Fever, Adult     A fever is a high body temperature that is 100.4F (38C) or higher. Brief mild or moderate fevers often have no lasting effects. They often do not need treatment. Moderate or high fevers can feel uncomfortable. Sometimes, they can be a sign of a serious problem. A fever that keeps coming back or that lasts a long time may cause you to lose water in your body (get dehydrated). You can use a thermometer to check for a fever. Temperature can change with: Age. Time of day. Where the temperature is taken, such as: In the mouth. In the opening of the butt (anus). In the ear. Under the arm. On the forehead. Follow these instructions at home: Medicines Take over-the-counter and prescription medicines only as told by your doctor. Follow instructions on how much medicine to take and how often. If you were prescribed antibiotics, take them as told by your doctor. Do not stop taking them even if you start to feel better. General instructions Watch for any changes in your symptoms. Tell your doctor about them. Rest as needed. Drink enough fluid to keep your pee (urine) pale yellow. Bathe or sponge bathe with room-temperature water as needed. This helps to lower your body temperature. Do not use cold water. Do not use too many blankets or wear clothes that are too heavy. You should stay home from work and public places for at least 24 hours after your fever is gone. Your fever should be gone without the use of medicines. Contact a doctor if: You vomit or have watery poop (diarrhea) that does not get better. You cannot eat or drink without vomiting. It hurts when you pee. Your symptoms do not get better with treatment or you have new symptoms. You have a rash on your skin. You have signs of not having enough water in your body, such as: Dark pee, very little pee, or no pee. Cracked lips or dry mouth. Sunken eyes. Sleepiness. Weakness. Get help right away if: You are short of  breath or have trouble breathing. You are dizzy or pass out (faint). You feel mixed up or confused. You have very bad pain in your belly (abdomen). These symptoms may be an emergency. Get help right away. Call 911. Do not wait to see if the symptoms will go away. Do not drive yourself to the hospital. This information is not intended to replace advice given to you by your health care provider. Make sure you discuss any questions you have with your health care provider. Document Revised: 01/06/2022 Document Reviewed: 01/06/2022 Elsevier Patient Education  2023 Elsevier Inc.  

## 2022-08-23 NOTE — Progress Notes (Signed)
Virtual Visit Consent   Brent [name], you are scheduled for a virtual visit with a [facility] provider today. Just as with appointments in the office, your consent must be obtained to participate. Your consent will be active for this visit and any virtual visit you may have with one of our providers in the next 365 days. If you have a MyChart account, a copy of this consent can be sent to you electronically.  As this is a virtual visit, video technology does not allow for your provider to perform a traditional examination. This may limit your provider's ability to fully assess your condition. If your provider identifies any concerns that need to be evaluated in person or the need to arrange testing (such as labs, EKG, etc.), we will make arrangements to do so. Although advances in technology are sophisticated, we cannot ensure that it will always work on either your end or our end. If the connection with a video visit is poor, the visit may have to be switched to a telephone visit. With either a video or telephone visit, we are not always able to ensure that we have a secure connection.  By engaging in this virtual visit, you consent to the provision of healthcare and authorize for your insurance to be billed (if applicable) for the services provided during this visit. Depending on your insurance coverage, you may receive a charge related to this service.  I need to obtain your verbal consent now. Are you willing to proceed with your visit today? BLESS CANINO has provided verbal consent on 08/23/2022 for a virtual visit (video or telephone). [name], FNP  Date: 08/23/2022 2:10 PM  Virtual Visit via Video Note   I, [name], connected with  Brent [name]  ([id number], 03-20-1956) on 08/23/22 at  3:00 PM EDT by a video-enabled telemedicine application and verified that I am speaking with the correct person using two identifiers.  Location: Patient: Virtual Visit Location Patient:  Home Provider: Virtual Visit Location Provider: Home Office   I discussed the limitations of evaluation and management by telemedicine and the availability of in person appointments. The patient expressed understanding and agreed to proceed.    History of Present Illness: Brent [name] is a 67 y.o. who identifies as a male who was assigned male at birth, and is being seen today for fever, chills, body aches started this am. Cystoscopy 1.5 weeks ago and he is not able to remember whether he took antibiotic after. He is not having any urinary sx or URI sx. He has not tested for covid.   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   Low back pain 09/05/2018   Anemia due to chronic blood loss 08/22/2018   Malabsorption of iron 07/18/2018   IDA (iron deficiency anemia) 07/18/2018   Marginal ulcer    Hematochezia    Acute blood loss anemia    Insomnia 06/07/2018   Shoulder pain 01/30/2018   Bilateral hip pain 10/05/2016   Ankle pain, right 09/14/2016   Pain of both hip joints 09/14/2016   Vitamin B12 deficiency 09/14/2016   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   Trapezius muscle spasm 04/02/2015   Neck pain, bilateral 04/02/2015   Headache(784.0) 11/26/2013   Nephrolithiasis 09/27/2013  Tinea corporis 09/27/2013   Preventative health care 05/24/2013   Hearing loss 05/24/2013   Anemia 03/28/2013   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   History of colonic polyps 03/23/2013   Gout 03/04/2013   Hyperlipidemia, mixed 11/16/2012   Obstructive sleep apnea on CPAP 04/07/2012   CAD (coronary artery disease) 01/12/2012   Vitamin D deficiency 10/14/2011   Gastroesophageal reflux disease 04/01/2011   Chronic pain of both knees 10/10/2010   Essential hypertension 07/14/2010   Morbid obesity (HCC) 03/27/2010    DIVERTICULOSIS, COLON 10/29/2008   DOE (dyspnea on exertion) 06/29/2008   T2DM (type 2 diabetes mellitus) (HCC) 07/05/2007    Allergies:  Allergies  Allergen Reactions   Bee Venom Anaphylaxis   Lipitor [Atorvastatin] Other (See Comments)    Myalgias, memory changes   Morphine Other (See Comments)    hyperactive   Metformin And Related Other (See Comments)    Myalgias and weakness   Lioresal [Baclofen] Other (See Comments)    Acted drunk   Medrol [Methylprednisolone] Other (See Comments)    Hyperglycemia    Zocor [Simvastatin] Other (See Comments)    Joint pain   Hydrocodone Itching   Latex Rash   Pravachol [Pravastatin] Other (See Comments)    Joint pain   Medications:  Current Outpatient Medications:    acetaminophen (TYLENOL) 500 MG tablet, Take 1,000 mg by mouth every 6 (six) hours as needed for moderate pain or headache., Disp: , Rfl:    amitriptyline (ELAVIL) 50 MG tablet, TAKE 1 TABLET AT BEDTIME, Disp: 90 tablet, Rfl: 3   amoxicillin (AMOXIL) 500 MG capsule, Take 2,000 mg by mouth See admin instructions. Take 4 capsules (2,000 mg) prior to dental appointments, Disp: , Rfl:    aspirin EC 81 MG tablet, Take 1 tablet (81 mg total) by mouth daily. Swallow whole., Disp: 30 tablet, Rfl: 12   Cholecalciferol (VITAMIN D) 50 MCG (2000 UT) tablet, Take 6,000 Units by mouth daily., Disp: , Rfl:    Cyanocobalamin (VITAMIN B-12) 1000 MCG SUBL, Place 1,000 mcg under the tongue 2 (two) times a week., Disp: , Rfl:    EPINEPHrine 0.3 mg/0.3 mL IJ SOAJ injection, INJECT 0.3 MLS (0.3 MG TOTAL) INTO THE MUSCLE ONCE FOR 1 DOSE, Disp: 2 each, Rfl: 11   ezetimibe (ZETIA) 10 MG tablet, Take 1 tablet (10 mg total) by mouth daily., Disp: 90 tablet, Rfl: 3   famotidine (PEPCID) 40 MG tablet, TAKE 1 TABLET AT BEDTIME AS NEEDED FOR HEARTBURN OR INDIGESTION, Disp: 90 tablet, Rfl: 3   fenofibrate 160 MG tablet, TAKE 1 TABLET DAILY, Disp: 90 tablet, Rfl: 0   Ferrous Gluconate-C-Folic Acid (IRON-C PO),  Take 1 tablet by mouth daily. Iron 65 mg and vit C-125 mg, Disp: , Rfl:    fluticasone (FLONASE) 50 MCG/ACT nasal spray, USE 2 SPRAYS IN EACH NOSTRIL DAILY, Disp: 48 g, Rfl: 3   gabapentin (NEURONTIN) 300 MG capsule, Take 300 mg by mouth 4 (four) times daily. Pt takes 3 tablets 900 mg is the AM, 1 tablet 300 mg in the afternoon, 3 tablets 900 mg in the evening, and 1 tablet 300 mg at bedtime for a total of 2400 mg daily., Disp: , Rfl:    hydrochlorothiazide (MICROZIDE) 12.5 MG capsule, TAKE 1 CAPSULE DAILY, Disp: 90 capsule, Rfl: 2   isosorbide mononitrate (IMDUR) 30 MG 24 hr tablet, Take 2 tablets (60 mg total) by mouth daily. Can take an additional 30mg  Tablet as needed, Disp: 100 tablet, Rfl: 2  Krill Oil 1000 MG CAPS, Take 1 capsule by mouth daily at 6 (six) AM., Disp: , Rfl:    losartan (COZAAR) 50 MG tablet, TAKE 1 TABLET DAILY (KEEP UPCOMING APPOINTMENT IN MARCH 2023 WITH CARDIOLOGIST BEFORE ANYMORE REFILLS) (Patient taking differently: Take 50 mg by mouth daily.), Disp: 90 tablet, Rfl: 3   Magnesium 100 MG CAPS, Take 1 capsule (100 mg total) by mouth daily., Disp: 30 capsule, Rfl: 1   methocarbamol (ROBAXIN) 500 MG tablet, Take 1 tablet (500 mg total) by mouth 4 (four) times daily., Disp: 360 tablet, Rfl: 1   methylPREDNISolone (MEDROL DOSEPAK) 4 MG TBPK tablet, 5 tabs po x 1 day then 4 tabs po x 1 day then 3 tabs po x 1 day then 2 tabs po x 1 day then 1 tab po x 1 day and stop, Disp: 15 tablet, Rfl: 0   metoprolol tartrate (LOPRESSOR) 100 MG tablet, TAKE 1 TABLET TWICE A DAY, Disp: 180 tablet, Rfl: 3   mometasone (ELOCON) 0.1 % cream, Apply 1 Application topically daily., Disp: 90 g, Rfl: 1   MOUNJARO 7.5 MG/0.5ML Pen, Inject 0.5 mLs into the skin once a week., Disp: , Rfl:    Multiple Vitamin (MULTIVITAMIN) tablet, Take 1 tablet by mouth daily., Disp: , Rfl:    nitroGLYCERIN (NITROSTAT) 0.4 MG SL tablet, Place 1 tablet (0.4 mg total) under the tongue every 5 (five) minutes as needed., Disp:  25 tablet, Rfl: 11   oxycodone (OXY-IR) 5 MG capsule, Take 5 mg by mouth 3 (three) times daily as needed for pain., Disp: , Rfl:    pantoprazole (PROTONIX) 40 MG tablet, TAKE 1 TABLET TWICE A DAY BEFORE MEALS (Patient taking differently: Take 40 mg by mouth 2 (two) times daily before a meal.), Disp: 180 tablet, Rfl: 3   Pitavastatin Calcium (LIVALO) 2 MG TABS, Take 1 tablet (2 mg total) by mouth daily., Disp: 90 tablet, Rfl: 3  Observations/Objective: Patient is well-developed, well-nourished in no acute distress.  Resting comfortably  at home.  Head is normocephalic, atraumatic.  No labored breathing.  Speech is clear and coherent with logical content.  Patient is alert and oriented at baseline.    Assessment and Plan: 1. Fever, unspecified fever cause  Increase fluids, continue tylenol, UC if sx persist or worsen for testing. Discussed this is viral vs UTI associated with cysto however it has been almost 2 weeks and with current sx he would be well advised to go to UC.   Follow Up Instructions: I discussed the assessment and treatment plan with the patient. The patient was provided an opportunity to ask questions and all were answered. The patient agreed with the plan and demonstrated an understanding of the instructions.  A copy of instructions were sent to the patient via MyChart unless otherwise noted below.     The patient was advised to call back or seek an in-person evaluation if the symptoms worsen or if the condition fails to improve as anticipated.  Time:  I spent 10 minutes with the patient via telehealth technology discussing the above problems/concerns.    [name], FNP

## 2022-08-27 NOTE — Progress Notes (Addendum)
COVID Vaccine Completed: yes  Date of COVID positive in last 90 days: no  PCP - Danise Edge, MD Cardiologist - Donato Schultz, MD LOV 07/12/22  Chest x-ray - 01/07/22 Epic EKG - 02/04/22 Epic Stress Test - 07/25/21 Epic ECHO - 12/08/21 Epic Cardiac Cath - 11/28/21 Epic Pacemaker/ICD device last checked: n/a Spinal Cord Stimulator: yes, instructed to bring DOS  Bowel Prep - no  Sleep Study - yes CPAP - yes every night   Fasting Blood Sugar - 120-150 Checks Blood Sugar  once a month  Last dose of GLP1 agonist-  Mounjaro last dose 08/23/22, unable to get. Switched to [employer]  GLP1 instructions:  hold Trulicity 7 days. Last dose 08/30/22. Do not take 09/06/22   Last dose of SGLT-2 inhibitors-  N/A SGLT-2 instructions: N/A   Blood Thinner Instructions:  Time Aspirin Instructions: ASA 81, hold 7 days before surgery Last Dose:  Activity level: Can go up a flight of stairs and perform activities of daily living without stopping and without symptoms of chest pain or shortness of breath. Difficulty with stairs due to ankle issues   Anesthesia review: HTN, CAD, OSA, DM2, DOE, stent x1, cardiac clearance?  Patient denies shortness of breath, fever, cough and chest pain at PAT appointment  Patient verbalized understanding of instructions that were given to them at the PAT appointment. Patient was also instructed that they will need to review over the PAT instructions again at home before surgery.

## 2022-08-28 ENCOUNTER — Encounter (HOSPITAL_COMMUNITY)
Admission: RE | Admit: 2022-08-28 | Discharge: 2022-08-28 | Disposition: A | Discharge status: Home / Self Care | Payer: Medicare Other | Source: Ambulatory Visit | Attending: Urology | Admitting: Urology

## 2022-08-28 ENCOUNTER — Encounter (HOSPITAL_COMMUNITY): Payer: Self-pay

## 2022-08-28 ENCOUNTER — Other Ambulatory Visit: Payer: Self-pay

## 2022-08-28 VITALS — BP 122/63 | HR 88 | Temp 99.4°F | Resp 18 | Ht 68.0 in | Wt 249.4 lb

## 2022-08-28 DIAGNOSIS — E1100 Type 2 diabetes mellitus with hyperosmolarity without nonketotic hyperglycemic-hyperosmolar coma (NKHHC): Secondary | ICD-10-CM | POA: Diagnosis not present

## 2022-08-28 DIAGNOSIS — Z01812 Encounter for preprocedural laboratory examination: Secondary | ICD-10-CM | POA: Diagnosis not present

## 2022-08-28 HISTORY — DX: Unspecified asthma, uncomplicated: J45.909

## 2022-08-28 LAB — BASIC METABOLIC PANEL
Anion gap: 9 (ref 5–15)
BUN: 23 mg/dL (ref 8–23)
CO2: 21 mmol/L — ABNORMAL LOW (ref 22–32)
Calcium: 8.6 mg/dL — ABNORMAL LOW (ref 8.9–10.3)
Chloride: 102 mmol/L (ref 98–111)
Creatinine, Ser: 1.52 mg/dL — ABNORMAL HIGH (ref 0.61–1.24)
GFR, Estimated: 50 mL/min — ABNORMAL LOW (ref >60)
Glucose, Bld: 151 mg/dL — ABNORMAL HIGH (ref 70–99)
Potassium: 4.8 mmol/L (ref 3.5–5.1)
Sodium: 132 mmol/L — ABNORMAL LOW (ref 135–145)

## 2022-08-28 LAB — CBC
HCT: 32.1 % — ABNORMAL LOW (ref 39.0–52.0)
Hemoglobin: 10.6 g/dL — ABNORMAL LOW (ref 13.0–17.0)
MCH: 29.7 pg (ref 26.0–34.0)
MCHC: 33 g/dL (ref 30.0–36.0)
MCV: 89.9 fL (ref 80.0–100.0)
Platelets: 188 10*3/uL (ref 150–400)
RBC: 3.57 MIL/uL — ABNORMAL LOW (ref 4.22–5.81)
RDW: 12.8 % (ref 11.5–15.5)
WBC: 3.8 10*3/uL — ABNORMAL LOW (ref 4.0–10.5)
nRBC: 0 % (ref 0.0–0.2)

## 2022-08-28 LAB — HEMOGLOBIN A1C
Hgb A1c MFr Bld: 5.8 % — ABNORMAL HIGH (ref 4.8–5.6)
Mean Plasma Glucose: 119.76 mg/dL

## 2022-08-28 LAB — GLUCOSE, CAPILLARY: Glucose-Capillary: 151 mg/dL — ABNORMAL HIGH (ref 70–99)

## 2022-08-28 NOTE — Patient Instructions (Addendum)
SURGICAL WAITING ROOM VISITATION  Patients having surgery or a procedure may have no more than 2 support people in the waiting area - these visitors may rotate.    Children under the age of 18 must have an adult with them who is not the patient.  Due to an increase in RSV and influenza rates and associated hospitalizations, children ages 35 and under may not visit patients in [facility] hospitals.  If the patient needs to stay at the hospital during part of their recovery, the visitor guidelines for inpatient rooms apply. Pre-op nurse will coordinate an appropriate time for 1 support person to accompany patient in pre-op.  This support person may not rotate.    Please refer to the [facility] website for the visitor guidelines for Inpatients (after your surgery is over and you are in a regular room).    Your procedure is scheduled on: 09/11/22   Report to [facility] Main Entrance    Report to admitting at 7:00 AM   Call this number if you have problems the morning of surgery [phone]   Do not eat food or drink liquids :After Midnight.          If you have questions, please contact your surgeon's office.   FOLLOW BOWEL PREP AND ANY ADDITIONAL PRE OP INSTRUCTIONS YOU RECEIVED FROM YOUR SURGEON'S OFFICE!!!     Oral Hygiene is also important to reduce your risk of infection.                                    Remember - BRUSH YOUR TEETH THE MORNING OF SURGERY WITH YOUR REGULAR TOOTHPASTE  DENTURES WILL BE REMOVED PRIOR TO SURGERY PLEASE DO NOT APPLY "Poly grip" OR ADHESIVES!!!   Take these medicines the morning of surgery with A SIP OF WATER: Tylenol, Ezetimibe, Fenofibrate, Gabapentin, IMDUR, Metoprolol, Oxycodone, Pantoprazole  DO NOT TAKE ANY ORAL DIABETIC MEDICATIONS DAY OF YOUR SURGERY  How to Manage Your Diabetes Before and After Surgery  Why is it important to control my blood sugar before and after surgery? Improving blood sugar levels before and after  surgery helps healing and can limit problems. A way of improving blood sugar control is eating a healthy diet by:  Eating less sugar and carbohydrates  Increasing activity/exercise  Talking with your doctor about reaching your blood sugar goals High blood sugars (greater than 180 mg/dL) can raise your risk of infections and slow your recovery, so you will need to focus on controlling your diabetes during the weeks before surgery. Make sure that the doctor who takes care of your diabetes knows about your planned surgery including the date and location.  How do I manage my blood sugar before surgery? Check your blood sugar at least 4 times a day, starting 2 days before surgery, to make sure that the level is not too high or low. Check your blood sugar the morning of your surgery when you wake up and every 2 hours until you get to the Short Stay unit. If your blood sugar is less than 70 mg/dL, you will need to treat for low blood sugar: Do not take insulin. Treat a low blood sugar (less than 70 mg/dL) with  cup of clear juice (cranberry or apple), 4 glucose tablets, OR glucose gel. Recheck blood sugar in 15 minutes after treatment (to make sure it is greater than 70 mg/dL). If your blood sugar  is not greater than 70 mg/dL on recheck, call [phone] for further instructions. Report your blood sugar to the short stay nurse when you get to Short Stay.  If you are admitted to the hospital after surgery: Your blood sugar will be checked by the staff and you will probably be given insulin after surgery (instead of oral diabetes medicines) to make sure you have good blood sugar levels. The goal for blood sugar control after surgery is 80-180 mg/dL.   WHAT DO I DO ABOUT MY DIABETES MEDICATION?  Do not take oral diabetes medicines (pills) the morning of surgery.  Hold Trulicity for 7 days before surgery. Last dose 08/30/22. Do not take 09/06/22.  DO NOT TAKE THE FOLLOWING 7 DAYS PRIOR TO SURGERY:  Ozempic, Wegovy, Rybelsus (Semaglutide), Byetta (exenatide), Bydureon (exenatide ER), Victoza, Saxenda (liraglutide), or Trulicity (dulaglutide) Mounjaro (Tirzepatide) Adlyxin (Lixisenatide), Polyethylene Glycol Loxenatide.  Reviewed and Endorsed by [facility] Patient Education Committee, August 2015  Bring CPAP mask and tubing day of surgery.                              You may not have any metal on your body including jewelry, and body piercing             Do not wear lotions, powders, cologne, or deodorant  Do not shave  48 hours prior to surgery.               Men may shave face and neck.   Do not bring valuables to the hospital. [facility] IS NOT             RESPONSIBLE   FOR VALUABLES.   Contacts, glasses, dentures or bridgework may not be worn into surgery.  DO NOT BRING YOUR HOME MEDICATIONS TO THE HOSPITAL. PHARMACY WILL DISPENSE MEDICATIONS LISTED ON YOUR MEDICATION LIST TO YOU DURING YOUR ADMISSION IN THE HOSPITAL!    Patients discharged on the day of surgery will not be allowed to drive home.  Someone NEEDS to stay with you for the first 24 hours after anesthesia.   Special Instructions: Bring a copy of your healthcare power of attorney and living will documents the day of surgery if you haven't scanned them before.              Please read over the following fact sheets you were given: IF YOU HAVE QUESTIONS ABOUT YOUR PRE-OP INSTRUCTIONS PLEASE CALL [phone] Contras    If you received a COVID test during your pre-op visit  it is requested that you wear a mask when out in public, stay away from anyone that may not be feeling well and notify your surgeon if you develop symptoms. If you test positive for Covid or have been in contact with anyone that has tested positive in the last 10 days please notify you surgeon.    [facility] - Preparing for Surgery Before surgery, you can play an important role.  Because skin is not sterile, your skin needs to be as free of  germs as possible.  You can reduce the number of germs on your skin by washing with CHG (chlorahexidine gluconate) soap before surgery.  CHG is an antiseptic cleaner which kills germs and bonds with the skin to continue killing germs even after washing. Please DO NOT use if you have an allergy to CHG or antibacterial soaps.  If your skin becomes reddened/irritated stop using the CHG and  inform your nurse when you arrive at Short Stay. Do not shave (including legs and underarms) for at least 48 hours prior to the first CHG shower.  You may shave your face/neck.  Please follow these instructions carefully:  1.  Shower with CHG Soap the night before surgery and the  morning of surgery.  2.  If you choose to wash your hair, wash your hair first as usual with your normal  shampoo.  3.  After you shampoo, rinse your hair and body thoroughly to remove the shampoo.                             4.  Use CHG as you would any other liquid soap.  You can apply chg directly to the skin and wash.  Gently with a scrungie or clean washcloth.  5.  Apply the CHG Soap to your body ONLY FROM THE NECK DOWN.   Do   not use on face/ open                           Wound or open sores. Avoid contact with eyes, ears mouth and   genitals (private parts).                       Wash face,  Genitals (private parts) with your normal soap.             6.  Wash thoroughly, paying special attention to the area where your    surgery  will be performed.  7.  Thoroughly rinse your body with warm water from the neck down.  8.  DO NOT shower/wash with your normal soap after using and rinsing off the CHG Soap.                9.  Pat yourself dry with a clean towel.            10.  Wear clean pajamas.            11.  Place clean sheets on your bed the night of your first shower and do not  sleep with pets. Day of Surgery : Do not apply any lotions/deodorants the morning of surgery.  Please wear clean clothes to the hospital/surgery  center.  FAILURE TO FOLLOW THESE INSTRUCTIONS MAY RESULT IN THE CANCELLATION OF YOUR SURGERY  PATIENT SIGNATURE_________________________________  NURSE SIGNATURE__________________________________  ________________________________________________________________________

## 2022-08-30 DIAGNOSIS — G4733 Obstructive sleep apnea (adult) (pediatric): Secondary | ICD-10-CM | POA: Diagnosis not present

## 2022-08-30 DIAGNOSIS — G4730 Sleep apnea, unspecified: Secondary | ICD-10-CM | POA: Diagnosis not present

## 2022-08-31 ENCOUNTER — Inpatient Hospital Stay (HOSPITAL_BASED_OUTPATIENT_CLINIC_OR_DEPARTMENT_OTHER): Payer: Medicare Other | Admitting: Medical Oncology

## 2022-08-31 ENCOUNTER — Encounter: Payer: Self-pay | Admitting: Medical Oncology

## 2022-08-31 ENCOUNTER — Inpatient Hospital Stay: Payer: Medicare Other | Attending: Hematology & Oncology

## 2022-08-31 VITALS — BP 108/54 | HR 80 | Temp 98.8°F | Resp 18 | Wt 243.0 lb

## 2022-08-31 DIAGNOSIS — Z79899 Other long term (current) drug therapy: Secondary | ICD-10-CM | POA: Diagnosis not present

## 2022-08-31 DIAGNOSIS — K909 Intestinal malabsorption, unspecified: Secondary | ICD-10-CM | POA: Insufficient documentation

## 2022-08-31 DIAGNOSIS — D509 Iron deficiency anemia, unspecified: Secondary | ICD-10-CM | POA: Diagnosis not present

## 2022-08-31 DIAGNOSIS — D500 Iron deficiency anemia secondary to blood loss (chronic): Secondary | ICD-10-CM | POA: Diagnosis not present

## 2022-08-31 DIAGNOSIS — Z9884 Bariatric surgery status: Secondary | ICD-10-CM | POA: Diagnosis not present

## 2022-08-31 LAB — CBC WITH DIFFERENTIAL (CANCER CENTER ONLY)
Abs Immature Granulocytes: 0.05 10*3/uL (ref 0.00–0.07)
Basophils Absolute: 0 10*3/uL (ref 0.0–0.1)
Basophils Relative: 0 %
Eosinophils Absolute: 0.1 10*3/uL (ref 0.0–0.5)
Eosinophils Relative: 2 %
HCT: 35.6 % — ABNORMAL LOW (ref 39.0–52.0)
Hemoglobin: 11.4 g/dL — ABNORMAL LOW (ref 13.0–17.0)
Immature Granulocytes: 1 %
Lymphocytes Relative: 15 %
Lymphs Abs: 1 10*3/uL (ref 0.7–4.0)
MCH: 28.6 pg (ref 26.0–34.0)
MCHC: 32 g/dL (ref 30.0–36.0)
MCV: 89.2 fL (ref 80.0–100.0)
Monocytes Absolute: 0.8 10*3/uL (ref 0.1–1.0)
Monocytes Relative: 12 %
Neutro Abs: 4.7 10*3/uL (ref 1.7–7.7)
Neutrophils Relative %: 70 %
Platelet Count: 231 10*3/uL (ref 150–400)
RBC: 3.99 MIL/uL — ABNORMAL LOW (ref 4.22–5.81)
RDW: 12.7 % (ref 11.5–15.5)
WBC Count: 6.8 10*3/uL (ref 4.0–10.5)
nRBC: 0 % (ref 0.0–0.2)

## 2022-08-31 LAB — IRON AND IRON BINDING CAPACITY (CC-WL,HP ONLY)
Iron: 42 ug/dL — ABNORMAL LOW (ref 45–182)
Saturation Ratios: 14 % — ABNORMAL LOW (ref 17.9–39.5)
TIBC: 293 ug/dL (ref 250–450)
UIBC: 251 ug/dL (ref 117–376)

## 2022-08-31 LAB — FERRITIN: Ferritin: 173 ng/mL (ref 24–336)

## 2022-08-31 NOTE — Progress Notes (Signed)
Hematology and Oncology Follow Up Visit  Brent [name] [id number] [birth year] 67 y.o. 08/31/2022   Principle Diagnosis:  Iron deficiency anemia secondary to malabsorption and history of acute GI bleed   Current Therapy:        IV iron as indicated    Interim History:  Brent [name] is here today for follow-up. We last saw him 6 months ago. He has been doing well other than his urologist thinks his bladder cancer has returned. He is going soon for treatment/biopsy and PET scan. He has the PET scan/procedure scheduled for the 24th of May.   No blood loss noted. No abnormal bruising, no petechiae.  No fever, chills, n/v, cough, rash, dizziness, SOB, chest pain, palpitations, abdominal pain or changes in bowel or bladder habits.  No swelling or tenderness in his extremities.  No falls or syncope.  Appetite and hydration are good.  Wt Readings from Last 3 Encounters:  08/31/22 243 lb (110.2 kg)  08/28/22 249 lb 6 oz (113.1 kg)  07/13/22 251 lb (113.9 kg)     ECOG Performance Status: 1 - Symptomatic but completely ambulatory  Medications:  Allergies as of 08/31/2022       Reactions   Bee Venom Anaphylaxis   Lipitor [atorvastatin] Other (See Comments)   Myalgias, memory changes   Morphine Other (See Comments)   hyperactive   Metformin And Related Other (See Comments)   Myalgias and weakness   Lioresal [baclofen] Other (See Comments)   Acted drunk   Medrol [methylprednisolone] Other (See Comments)   Hyperglycemia    Zocor [simvastatin] Other (See Comments)   Joint pain   Hydrocodone Itching   Latex Rash   Pravachol [pravastatin] Other (See Comments)   Joint pain        Medication List        Accurate as of Aug 31, 2022  1:56 PM. If you have any questions, ask your nurse or doctor.          acetaminophen 500 MG tablet Commonly known as: TYLENOL Take 1,000 mg by mouth every 6 (six) hours as needed for moderate pain or headache.   amitriptyline 50 MG  tablet Commonly known as: ELAVIL TAKE 1 TABLET AT BEDTIME   amoxicillin 500 MG capsule Commonly known as: AMOXIL Take 2,000 mg by mouth See admin instructions. Take 2,000 mg prior to dental appointments   aspirin EC 81 MG tablet Take 1 tablet (81 mg total) by mouth daily. Swallow whole.   docusate sodium 100 MG capsule Commonly known as: COLACE Take 100 mg by mouth daily as needed for mild constipation.   EPINEPHrine 0.3 mg/0.3 mL Soaj injection Commonly known as: EPI-PEN INJECT 0.3 MLS (0.3 MG TOTAL) INTO THE MUSCLE ONCE FOR 1 DOSE   ezetimibe 10 MG tablet Commonly known as: ZETIA Take 1 tablet (10 mg total) by mouth daily.   famotidine 40 MG tablet Commonly known as: PEPCID TAKE 1 TABLET AT BEDTIME AS NEEDED FOR HEARTBURN OR INDIGESTION   fenofibrate 160 MG tablet TAKE 1 TABLET DAILY   fluticasone 50 MCG/ACT nasal spray Commonly known as: FLONASE USE 2 SPRAYS IN EACH NOSTRIL DAILY   gabapentin 300 MG capsule Commonly known as: NEURONTIN Take 300 mg by mouth See admin instructions. Pt takes 3 tablets 900 mg is the AM, 1 tablet 300 mg in the afternoon, 3 tablets 900 mg in the evening, and 1 tablet 300 mg at bedtime for a total of 2400 mg daily.   hydrochlorothiazide 12.5 MG  capsule Commonly known as: MICROZIDE TAKE 1 CAPSULE DAILY   IRON-C PO Take 1 tablet by mouth daily. Iron 65 mg and vit C-125 mg   isosorbide mononitrate 30 MG 24 hr tablet Commonly known as: IMDUR Take 2 tablets (60 mg total) by mouth daily. Can take an additional 30mg  Tablet as needed   Krill Oil 1000 MG Caps Take 1,000 mg by mouth daily.   Livalo 2 MG Tabs Generic drug: Pitavastatin Calcium Take 1 tablet (2 mg total) by mouth daily.   losartan 50 MG tablet Commonly known as: COZAAR TAKE 1 TABLET DAILY (KEEP UPCOMING APPOINTMENT IN MARCH 2023 WITH CARDIOLOGIST BEFORE ANYMORE REFILLS)   Magnesium 100 MG Caps Take 1 capsule (100 mg total) by mouth daily.   methocarbamol 500 MG  tablet Commonly known as: ROBAXIN Take 1 tablet (500 mg total) by mouth 4 (four) times daily.   methylPREDNISolone 4 MG Tbpk tablet Commonly known as: MEDROL DOSEPAK 5 tabs po x 1 day then 4 tabs po x 1 day then 3 tabs po x 1 day then 2 tabs po x 1 day then 1 tab po x 1 day and stop   metoprolol tartrate 100 MG tablet Commonly known as: LOPRESSOR TAKE 1 TABLET TWICE A DAY   mometasone 0.1 % cream Commonly known as: Elocon Apply 1 Application topically daily. What changed:  when to take this reasons to take this   Mounjaro 7.5 MG/0.5ML Pen Generic drug: tirzepatide Inject 7.5 mg into the skin once a week.   multivitamin tablet Take 1 tablet by mouth daily.   nitroGLYCERIN 0.4 MG SL tablet Commonly known as: Nitrostat Place 1 tablet (0.4 mg total) under the tongue every 5 (five) minutes as needed.   NON FORMULARY Pt uses a cpap nightly   oxycodone 5 MG capsule Commonly known as: OXY-IR Take 5 mg by mouth in the morning, at noon, in the evening, and at bedtime.   pantoprazole 40 MG tablet Commonly known as: PROTONIX TAKE 1 TABLET TWICE A DAY BEFORE MEALS   Trulicity 4.5 MG/0.5ML Sopn Generic drug: Dulaglutide Inject 4.5 mg into the skin once a week.   Trulicity 3 MG/0.5ML Sopn Generic drug: Dulaglutide Inject 1 mg into the skin once a week.   Vitamin B-12 1000 MCG Subl Place 1,000 mcg under the tongue 2 (two) times a week.   Vitamin D 50 MCG (2000 UT) tablet Take 6,000 Units by mouth daily.        Allergies:  Allergies  Allergen Reactions   Bee Venom Anaphylaxis   Lipitor [Atorvastatin] Other (See Comments)    Myalgias, memory changes   Morphine Other (See Comments)    hyperactive   Metformin And Related Other (See Comments)    Myalgias and weakness   Lioresal [Baclofen] Other (See Comments)    Acted drunk   Medrol [Methylprednisolone] Other (See Comments)    Hyperglycemia    Zocor [Simvastatin] Other (See Comments)    Joint pain   Hydrocodone  Itching   Latex Rash   Pravachol [Pravastatin] Other (See Comments)    Joint pain    Past Medical History, Surgical history, Social history, and Family History were reviewed and updated.  Review of Systems: All other 10 point review of systems is negative.   Physical Exam:  weight is 243 lb (110.2 kg). His oral temperature is 98.8 F (37.1 C). His blood pressure is 108/54 (abnormal) and his pulse is 80. His respiration is 18 and oxygen saturation is 97%.  Wt Readings from Last 3 Encounters:  08/31/22 243 lb (110.2 kg)  08/28/22 249 lb 6 oz (113.1 kg)  07/13/22 251 lb (113.9 kg)   General: AAx3.  Ocular: Sclerae unicteric, pupils equal, round and reactive to light Ear-nose-throat: Oropharynx clear, dentition fair Lymphatic: No cervical or supraclavicular adenopathy Lungs no rales or rhonchi, good excursion bilaterally Heart regular rate and rhythm, no murmur appreciated Neuro: non-focal, well-oriented, appropriate affect  Lab Results  Component Value Date   WBC 6.8 08/31/2022   HGB 11.4 (L) 08/31/2022   HCT 35.6 (L) 08/31/2022   MCV 89.2 08/31/2022   PLT 231 08/31/2022   Lab Results  Component Value Date   FERRITIN 120 06/29/2022   IRON 76 06/29/2022   TIBC 370 06/29/2022   UIBC 294 06/29/2022   IRONPCTSAT 21 06/29/2022   Lab Results  Component Value Date   RETICCTPCT 1.5 05/04/2022   RBC 3.99 (L) 08/31/2022   No results found for: "KPAFRELGTCHN", "LAMBDASER", "KAPLAMBRATIO" No results found for: "IGGSERUM", "IGA", "IGMSERUM" No results found for: "TOTALPROTELP", "ALBUMINELP", "A1GS", "A2GS", "BETS", "BETA2SER", "GAMS", "MSPIKE", "SPEI"   Chemistry      Component Value Date/Time   NA 132 (L) 08/28/2022 1017   NA 139 12/08/2021 1121   K 4.8 08/28/2022 1017   CL 102 08/28/2022 1017   CO2 21 (L) 08/28/2022 1017   BUN 23 08/28/2022 1017   BUN 21 12/08/2021 1121   CREATININE 1.52 (H) 08/28/2022 1017   CREATININE 1.19 11/18/2021 1131   CREATININE 1.19  01/11/2020 1543      Component Value Date/Time   CALCIUM 8.6 (L) 08/28/2022 1017   ALKPHOS 39 (L) 03/23/2022 0733   AST 29 03/23/2022 0733   AST 21 11/18/2021 1131   ALT 16 03/23/2022 0733   ALT 15 11/18/2021 1131   BILITOT 0.3 03/23/2022 0733   BILITOT 0.7 11/18/2021 1131     Impression and Plan: Brent [name] is a very pleasant 67 yo caucasian gentleman with iron deficiency anemia secondary to malabsorption (gastric bypass) as well intermittent GI blood loss with ulcer.  Iron studies are pending. Hgb stable with a value of 11.4 today Lab check every 2 months, follow-up in 6 months.   Rushie Chestnut, PA-C 5/13/20241:56 PM

## 2022-09-03 ENCOUNTER — Other Ambulatory Visit: Payer: Self-pay | Admitting: Cardiology

## 2022-09-08 ENCOUNTER — Telehealth: Payer: Self-pay | Admitting: Cardiology

## 2022-09-08 DIAGNOSIS — E1165 Type 2 diabetes mellitus with hyperglycemia: Secondary | ICD-10-CM | POA: Diagnosis not present

## 2022-09-08 NOTE — Telephone Encounter (Signed)
Called and spoke with pt who is reasonably upset that he was told he can not have has planned surgery for bladder CA because this office has not given him cardiac clearance.  He is a pt at [facility] and appears to be a pt of Dr Jerilee Field.  This office has not received any type of request for surgical clearance from [facility].  Pt is currently scheduled for 09/11/22 per his report.  Advised pt [facility] will need to contact the office ASAP to advise of type of procedure, type of anesthesia and if the surgeon requires pt to hold any medication for the surgical procedure.   Pt was last seen here 07/13/22 by Robin Searing, NP for his 6 month f/u.  At that time pt reported  "that he is been experiencing some stable angina that has occurred over the last few weeks.  The last episode occurred 3 days ago and has been relieved with as needed nitroglycerin.  He denies any crushing chest pain or increased palpitations with these episodes.  - per Robin Searing, NP office visit note.  Pt was ordered to have a cardiac PET/CT stress test which is scheduled for 09/29/22.

## 2022-09-08 NOTE — Telephone Encounter (Signed)
I called and left a message for Pam the surgery scheduler at [facility] for a formal surgical clearance so we can assist them patient in getting cleared.

## 2022-09-08 NOTE — Telephone Encounter (Signed)
Patient is requesting to speak with Dr. Anne Fu' nurse regarding PET scan/stress test being after his procedure. He states he was advised that they would need to be prior to his procedure and that he can die on the operating table. He is requesting a call back before 5:00 PM today if at all possible.

## 2022-09-08 NOTE — Telephone Encounter (Signed)
   Pre-operative Risk Assessment    Patient Name: Brent [name]  DOB: Jan 06, 1956 MRN: [id number]      Request for Surgical Clearance    Procedure:   Bladder biopsy   Date of Surgery:  Clearance 09/11/22                                 Surgeon:  Dr. Mena Goes Surgeon's Group or Practice Name:  [facility]   Phone number:  [phone] Fax number:  [phone]   Type of Clearance Requested:   - Medical    Type of Anesthesia:  General    Additional requests/questions:   Anesthesia is requesting a clearance   Signed, Filomena Jungling   09/08/2022, 2:37 PM

## 2022-09-08 NOTE — Telephone Encounter (Signed)
-----   Message from Gaston Islam., NP sent at 09/08/2022  1:15 PM EDT ----- Regarding: FW: Surgery Cancelled Patient was scheduled for stress test due to ongoing stable angina requiring nitroglycerin.  He was provided consent and reviewed the indications for the test during his visit.  He was in agreement to proceed with the test during our visit.  He also required titration of his isosorbide which was completed which further explains the indication for requiring ischemic evaluation.  I was not aware during our visit that he had a scheduled procedure planned.  Please let me know if you have any further questions and I hope this clears up the situation.   ----- Message ----- From: Sherlean Foot Sent: 09/08/2022  12:40 PM EDT To: Jake Bathe, MD; Gaston Islam., NP; # Subject: Surgery Cancelled                              Hello,    The patient contacted Nuclear Medicine and was very upset that his surgery was canceled. He wanted know why he was scheduled for the Cardiac PET SCAN and nobody told him he needed to do the test.  I spoke with the patient and scheduled the PET scan with him on 5/10@0852 . He stated that he contacted the office of Dr. Anne Fu to find why he needed this test and the office transferred him to Nuclear Medicine to explain the reason.   Can someone please touch basis with the patient to get everything cleared up.    Thank You!

## 2022-09-09 NOTE — Telephone Encounter (Signed)
Follow Up:     Pam is calling to check on the status of this patient's clearance. He is scheduled for surgery on Friday(09-11-22)

## 2022-09-09 NOTE — Telephone Encounter (Signed)
I will forward to pre op APP to review notes. I have read the notes and from what I am reading it seems that surgery was cancelled, but surgeon office is called and said procedure is 09/11/22. I will call the requesting office as well.   I left a message for surgery scheduler Pam to call me back.   Pam from [facility] called back and said the pt's procedure has not been cancelled, see previous notes. She states the pt is upset, again see previous notes.   Pam states the anesthesiologist has asked would there be a possibility if the pt can have bladder Bx w/o cardiac testing being done first. I did read notes to [facility], and informed her from the notes I am reading the likely hood is still no, that he will still need cardiac tests before he can be cleared. Pt c/o angina with use of NTG as well. I assured Pam that I will re-present to Dr. Anne Fu and the pre op APP. I will call her back and let her know one way or the other. Pam, does state the pt has bladder cancer, though Dr. Mena Goes did say he could move procedure out a bit after the stress has been done, however just can wait terribly long , (ie as in the Fall).   Pam thanked me for the help. I will fax these notes as FYI to Dr. Mena Goes and Elita Quick in the meantime.

## 2022-09-10 ENCOUNTER — Other Ambulatory Visit: Payer: Self-pay | Admitting: Nurse Practitioner

## 2022-09-16 ENCOUNTER — Encounter (HOSPITAL_COMMUNITY): Payer: Self-pay

## 2022-09-17 DIAGNOSIS — Z794 Long term (current) use of insulin: Secondary | ICD-10-CM | POA: Diagnosis not present

## 2022-09-17 DIAGNOSIS — H2513 Age-related nuclear cataract, bilateral: Secondary | ICD-10-CM | POA: Diagnosis not present

## 2022-09-17 DIAGNOSIS — E1139 Type 2 diabetes mellitus with other diabetic ophthalmic complication: Secondary | ICD-10-CM | POA: Diagnosis not present

## 2022-09-28 ENCOUNTER — Telehealth (HOSPITAL_COMMUNITY): Payer: Self-pay | Admitting: Emergency Medicine

## 2022-09-28 NOTE — Progress Notes (Addendum)
Anesthesia Review:  PCP: Gabriel Cirri, FNP Video Visit- 08/23/22  Cardiologist : DR Sherron Flemings, NP LOV 07/13/22  Chest x-ray : 01/07/22- 2 view  EKG : 05/07/21  Echo : 12/08/21  Stress test: 09/24/21  CT Card- 09/29/22  Monitor- 2021  Cardiac Cath :  11/28/21  Activity level: can do a flight of stairs without difficutly  Sleep Study/ CPAP : has cpap  Fasting Blood Sugar :      / Checks Blood Sugar -- times a day:   Blood Thinner/ Instructions /Last Dose: ASA / Instructions/ Last Dose :    81 mg aspirin   DM- type 2 - checks glucose 1/2x per week   08/28/22- hgba1c- 5.8  Trulicity - Last dose on 09/27/22 PT switched by MD from Trulicity to Ozempic- has not started will not start until after surgery.

## 2022-09-28 NOTE — Telephone Encounter (Signed)
Reaching out to patient to offer assistance regarding upcoming cardiac imaging study; pt verbalizes understanding of appt date/time, parking situation and where to check in, pre-test NPO status and medications ordered, and verified current allergies; name and call back number provided for further questions should they arise Ashyia Schraeder RN Navigator Cardiac Imaging [facility] [phone] office [phone] cell 

## 2022-09-29 ENCOUNTER — Ambulatory Visit (HOSPITAL_COMMUNITY)
Admission: RE | Admit: 2022-09-29 | Discharge: 2022-09-29 | Disposition: A | Discharge status: Home / Self Care | Payer: Medicare Other | Source: Ambulatory Visit | Attending: Nurse Practitioner | Admitting: Nurse Practitioner

## 2022-09-29 DIAGNOSIS — I10 Essential (primary) hypertension: Secondary | ICD-10-CM | POA: Insufficient documentation

## 2022-09-29 DIAGNOSIS — G4733 Obstructive sleep apnea (adult) (pediatric): Secondary | ICD-10-CM | POA: Diagnosis not present

## 2022-09-29 DIAGNOSIS — I2510 Atherosclerotic heart disease of native coronary artery without angina pectoris: Secondary | ICD-10-CM | POA: Insufficient documentation

## 2022-09-29 DIAGNOSIS — Z0189 Encounter for other specified special examinations: Secondary | ICD-10-CM | POA: Diagnosis not present

## 2022-09-29 DIAGNOSIS — E782 Mixed hyperlipidemia: Secondary | ICD-10-CM | POA: Diagnosis not present

## 2022-09-29 DIAGNOSIS — Z9884 Bariatric surgery status: Secondary | ICD-10-CM | POA: Insufficient documentation

## 2022-09-29 LAB — NM PET CT CARDIAC PERFUSION MULTI W/ABSOLUTE BLOODFLOW
MBFR: 1.7
Nuc Rest EF: 49 %
Nuc Stress EF: 59 %
Rest MBF: 0.88 ml/g/min
ST Depression (mm): 0 mm
Stress MBF: 1.5 ml/g/min
TID: 1.09

## 2022-09-29 MED ORDER — RUBIDIUM RB82 GENERATOR (RUBYFILL)
28.8000 | PACK | Freq: Once | INTRAVENOUS | Status: AC
  Administered 2022-09-29: 28.8 via INTRAVENOUS

## 2022-09-29 MED ORDER — REGADENOSON 0.4 MG/5ML IV SOLN
0.4000 mg | Freq: Once | INTRAVENOUS | Status: AC
  Administered 2022-09-29: 0.4 mg via INTRAVENOUS

## 2022-09-29 MED ORDER — RUBIDIUM RB82 GENERATOR (RUBYFILL)
29.0000 | PACK | Freq: Once | INTRAVENOUS | Status: AC
  Administered 2022-09-29: 29 via INTRAVENOUS

## 2022-09-29 MED ORDER — REGADENOSON 0.4 MG/5ML IV SOLN
INTRAVENOUS | Status: AC
  Filled 2022-09-29: qty 5

## 2022-09-29 NOTE — Telephone Encounter (Signed)
   Patient Name: Brent [name]  DOB: 17-Feb-1956 MRN: [id number]  Primary Cardiologist: Donato Schultz, MD  Chart reviewed as part of pre-operative protocol coverage. Given past medical history and time since last visit, based on ACC/AHA guidelines, BREYLON SHERROW is at acceptable risk for the planned procedure without further cardiovascular testing. Mr.Rocchio is able to complete 4 METS of activity without any difficulty.  Cardiac PET was completed and revealed no evidence of ischemia or infarct.   The patient was advised that if he develops new symptoms prior to surgery to contact our office to arrange for a follow-up visit, and he verbalized understanding.  I will route this recommendation to the requesting party via Epic fax function and remove from pre-op pool.  Please call with questions.  Napoleon Form, Leodis Rains, NP 09/29/2022, 7:35 PM

## 2022-09-29 NOTE — Progress Notes (Signed)
Tolerated scan read

## 2022-09-29 NOTE — Patient Instructions (Signed)
SURGICAL WAITING ROOM VISITATION  Patients having surgery or a procedure may have no more than 2 support people in the waiting area - these visitors may rotate.    Children under the age of 26 must have an adult with them who is not the patient.  Due to an increase in RSV and influenza rates and associated hospitalizations, children ages 89 and under may not visit patients in [facility] hospitals.  If the patient needs to stay at the hospital during part of their recovery, the visitor guidelines for inpatient rooms apply. Pre-op nurse will coordinate an appropriate time for 1 support person to accompany patient in pre-op.  This support person may not rotate.    Please refer to the [facility] website for the visitor guidelines for Inpatients (after your surgery is over and you are in a regular room).       Your procedure is scheduled on:  10/06/2022    Report to [facility] Main Entrance    Report to admitting at  0745 AM   Call this number if you have problems the morning of surgery [phone]   Do not eat food or drink liquids after midnite.                 If you have questions, please contact your surgeon's office.       Oral Hygiene is also important to reduce your risk of infection.                                    Remember - BRUSH YOUR TEETH THE MORNING OF SURGERY WITH YOUR REGULAR TOOTHPASTE  DENTURES WILL BE REMOVED PRIOR TO SURGERY PLEASE DO NOT APPLY "Poly grip" OR ADHESIVES!!!   Do NOT smoke after Midnight   Take these medicines the morning of surgery with A SIP OF WATER:   DO NOT TAKE ANY ORAL DIABETIC MEDICATIONS DAY OF YOUR SURGERY  Bring CPAP mask and tubing day of surgery.                              You may not have any metal on your body including hair pins, jewelry, and body piercing             Do not wear make-up, lotions, powders, perfumes/cologne, or deodorant  Do not wear nail polish including gel and S&S, artificial/acrylic  nails, or any other type of covering on natural nails including finger and toenails. If you have artificial nails, gel coating, etc. that needs to be removed by a nail salon please have this removed prior to surgery or surgery may need to be canceled/ delayed if the surgeon/ anesthesia feels like they are unable to be safely monitored.   Do not shave  48 hours prior to surgery.               Men may shave face and neck.   Do not bring valuables to the hospital. [facility] IS NOT             RESPONSIBLE   FOR VALUABLES.   Contacts, glasses, dentures or bridgework may not be worn into surgery.   Bring small overnight bag day of surgery.   DO NOT BRING YOUR HOME MEDICATIONS TO THE HOSPITAL. PHARMACY WILL DISPENSE MEDICATIONS LISTED ON YOUR MEDICATION LIST TO YOU DURING YOUR ADMISSION IN THE  HOSPITAL!    Patients discharged on the day of surgery will not be allowed to drive home.  Someone NEEDS to stay with you for the first 24 hours after anesthesia.   Special Instructions: Bring a copy of your healthcare power of attorney and living will documents the day of surgery if you haven't scanned them before.              Please read over the following fact sheets you were given: IF [place] [phone]   If you received a COVID test during your pre-op visit  it is requested that you wear a mask when out in public, stay away from anyone that may not be feeling well and notify your surgeon if you develop symptoms. If you test positive for Covid or have been in contact with anyone that has tested positive in the last 10 days please notify you surgeon.    [place] - Preparing for Surgery Before surgery, you can play an important role.  Because skin is not sterile, your skin needs to be as free of germs as possible.  You can reduce the number of germs on your skin by washing with CHG (chlorahexidine gluconate) soap before surgery.  CHG is an  antiseptic cleaner which kills germs and bonds with the skin to continue killing germs even after washing. Please DO NOT use if you have an allergy to CHG or antibacterial soaps.  If your skin becomes reddened/irritated stop using the CHG and inform your nurse when you arrive at Short Stay. Do not shave (including legs and underarms) for at least 48 hours prior to the first CHG shower.  You may shave your face/neck. Please follow these instructions carefully:  1.  Shower with CHG Soap the night before surgery and the  morning of Surgery.  2.  If you choose to wash your hair, wash your hair first as usual with your  normal  shampoo.  3.  After you shampoo, rinse your hair and body thoroughly to remove the  shampoo.                           4.  Use CHG as you would any other liquid soap.  You can apply chg directly  to the skin and wash                       Gently with a scrungie or clean washcloth.  5.  Apply the CHG Soap to your body ONLY FROM THE NECK DOWN.   Do not use on face/ open                           Wound or open sores. Avoid contact with eyes, ears mouth and genitals (private parts).                       Wash face,  Genitals (private parts) with your normal soap.             6.  Wash thoroughly, paying special attention to the area where your surgery  will be performed.  7.  Thoroughly rinse your body with warm water from the neck down.  8.  DO NOT shower/wash with your normal soap after using and rinsing off  the CHG Soap.  9.  Pat yourself dry with a clean towel.            10.  Wear clean pajamas.            11.  Place clean sheets on your bed the night of your first shower and do not  sleep with pets. Day of Surgery : Do not apply any lotions/deodorants the morning of surgery.  Please wear clean clothes to the hospital/surgery center.  FAILURE TO FOLLOW THESE INSTRUCTIONS MAY RESULT IN THE CANCELLATION OF YOUR SURGERY PATIENT  SIGNATURE_________________________________  NURSE SIGNATURE__________________________________  ________________________________________________________________________

## 2022-10-02 ENCOUNTER — Encounter (HOSPITAL_COMMUNITY)
Admission: RE | Admit: 2022-10-02 | Discharge: 2022-10-02 | Disposition: A | Discharge status: Home / Self Care | Payer: Medicare Other | Source: Ambulatory Visit | Attending: Urology | Admitting: Urology

## 2022-10-02 ENCOUNTER — Encounter (HOSPITAL_COMMUNITY): Payer: Self-pay

## 2022-10-02 VITALS — BP 126/82 | HR 75 | Temp 98.4°F | Resp 16 | Ht 68.0 in | Wt 233.0 lb

## 2022-10-02 DIAGNOSIS — Z01818 Encounter for other preprocedural examination: Secondary | ICD-10-CM

## 2022-10-02 DIAGNOSIS — M4802 Spinal stenosis, cervical region: Secondary | ICD-10-CM | POA: Insufficient documentation

## 2022-10-02 DIAGNOSIS — G4733 Obstructive sleep apnea (adult) (pediatric): Secondary | ICD-10-CM | POA: Insufficient documentation

## 2022-10-02 DIAGNOSIS — I129 Hypertensive chronic kidney disease with stage 1 through stage 4 chronic kidney disease, or unspecified chronic kidney disease: Secondary | ICD-10-CM | POA: Diagnosis not present

## 2022-10-02 DIAGNOSIS — I2510 Atherosclerotic heart disease of native coronary artery without angina pectoris: Secondary | ICD-10-CM | POA: Diagnosis not present

## 2022-10-02 DIAGNOSIS — E1122 Type 2 diabetes mellitus with diabetic chronic kidney disease: Secondary | ICD-10-CM | POA: Insufficient documentation

## 2022-10-02 DIAGNOSIS — Z87891 Personal history of nicotine dependence: Secondary | ICD-10-CM | POA: Insufficient documentation

## 2022-10-02 DIAGNOSIS — K219 Gastro-esophageal reflux disease without esophagitis: Secondary | ICD-10-CM | POA: Diagnosis not present

## 2022-10-02 DIAGNOSIS — N189 Chronic kidney disease, unspecified: Secondary | ICD-10-CM | POA: Insufficient documentation

## 2022-10-02 DIAGNOSIS — G8929 Other chronic pain: Secondary | ICD-10-CM | POA: Diagnosis not present

## 2022-10-02 DIAGNOSIS — Z6835 Body mass index (BMI) 35.0-35.9, adult: Secondary | ICD-10-CM | POA: Diagnosis not present

## 2022-10-02 DIAGNOSIS — Z01812 Encounter for preprocedural laboratory examination: Secondary | ICD-10-CM | POA: Diagnosis not present

## 2022-10-02 HISTORY — DX: Personal history of urinary calculi: Z87.442

## 2022-10-02 HISTORY — DX: Unspecified osteoarthritis, unspecified site: M19.90

## 2022-10-02 HISTORY — DX: Raynaud's syndrome without gangrene: I73.00

## 2022-10-02 LAB — BASIC METABOLIC PANEL
Anion gap: 9 (ref 5–15)
BUN: 25 mg/dL — ABNORMAL HIGH (ref 8–23)
CO2: 24 mmol/L (ref 22–32)
Calcium: 9 mg/dL (ref 8.9–10.3)
Chloride: 104 mmol/L (ref 98–111)
Creatinine, Ser: 1.28 mg/dL — ABNORMAL HIGH (ref 0.61–1.24)
GFR, Estimated: >60 mL/min (ref >60)
Glucose, Bld: 152 mg/dL — ABNORMAL HIGH (ref 70–99)
Potassium: 4.4 mmol/L (ref 3.5–5.1)
Sodium: 137 mmol/L (ref 135–145)

## 2022-10-02 LAB — GLUCOSE, CAPILLARY: Glucose-Capillary: 153 mg/dL — ABNORMAL HIGH (ref 70–99)

## 2022-10-02 LAB — CBC
HCT: 36.1 % — ABNORMAL LOW (ref 39.0–52.0)
Hemoglobin: 11.6 g/dL — ABNORMAL LOW (ref 13.0–17.0)
MCH: 29.2 pg (ref 26.0–34.0)
MCHC: 32.1 g/dL (ref 30.0–36.0)
MCV: 90.9 fL (ref 80.0–100.0)
Platelets: 198 10*3/uL (ref 150–400)
RBC: 3.97 MIL/uL — ABNORMAL LOW (ref 4.22–5.81)
RDW: 13.7 % (ref 11.5–15.5)
WBC: 5.6 10*3/uL (ref 4.0–10.5)
nRBC: 0 % (ref 0.0–0.2)

## 2022-10-02 NOTE — Progress Notes (Signed)
Choose an anesthesia record to view details        DISCUSSION: Brent [name] is a 67 yo male who presents to PAT prior to cystoscopy with biopsy for suspected bladder cancer. PMH significant for former smoking, CAD s/p PCI to RCA (11/2021), HTN, DM, OSA (uses CPAP), GERD, obesity s/p gastric bypass (2012), spinal stenosis of cervical spine, s/p insertion of spinal cord stimulator (09/2021), anemia, hx of GIB, chronic pain with long term opiate use  Prior anesthesia complications include PONV.  Patient last saw Cardiology on 07/13/22 and reported stable angina symptoms for which he took nitroglycerin and this resolved his symptoms. For this reason a PET stress test was ordered however in the meantime patient has been undergoing a work up for bladder cancer. His surgery was scheduled but he did not complete the stress test so it has been postponed until this was completed. It was finally completed on 6/11 and was low-moderate risk. Per Cardiology via telephone visit on 09/29/22:   "Chart reviewed as part of pre-operative protocol coverage. Given past medical history and time since last visit, based on ACC/AHA guidelines, Brent [name] is at acceptable risk for the planned procedure without further cardiovascular testing. Brent [name] is able to complete 4 METS of activity without any difficulty.  Cardiac PET was completed and revealed no evidence of ischemia or infarct."    VS: BP 126/82   Pulse 75   Temp 36.9 C (Oral)   Resp 16   Ht 5\' 8"  (1.727 m)   Wt 105.7 kg   SpO2 96%   BMI 35.43 kg/m   PROVIDERS: Bradd Canary, MD Cardiologist: Donato Schultz, MD  LABS: Labs reviewed: Acceptable for surgery. (all labs ordered are listed, but only abnormal results are displayed)  Labs Reviewed  BASIC METABOLIC PANEL - Abnormal; Notable for the following components:      Result Value   Glucose, Bld 152 (*)    BUN 25 (*)    Creatinine, Ser 1.28 (*)    All other components within normal limits  CBC  - Abnormal; Notable for the following components:   RBC 3.97 (*)    Hemoglobin 11.6 (*)    HCT 36.1 (*)    All other components within normal limits  GLUCOSE, CAPILLARY - Abnormal; Notable for the following components:   Glucose-Capillary 153 (*)    All other components within normal limits     IMAGES:  CXR 01/07/22:  FINDINGS: The heart size and mediastinal contours are within normal limits. Both lungs are clear. The visualized skeletal structures are unremarkable.   IMPRESSION: No active cardiopulmonary disease.   EKG 02/04/22:  NSR Low voltage   CV:  PET Stress test 09/29/22:   Findings are consistent with no ischemia and no infarction. The study is low to intermediate risk due to the presence of mildly reduced myocardial blood flow reserve. Given that perfusion is normal, there is no TID, and EF augments with stress, this is likely due to microvascular disease in the setting of CKD however we cannot rule out multivessel CAD. Recommend clinical correlation.   LV perfusion is normal. There is no evidence of ischemia. There is no evidence of infarction.   Rest left ventricular function is normal. Rest EF: 49 %. Stress left ventricular function is normal. Stress EF: 59 %. End diastolic cavity size is normal. End systolic cavity size is normal.   Myocardial blood flow was computed to be 0.51ml/g/min at rest and 1.63ml/g/min at stress. Global myocardial blood  flow reserve was 1.70 and was mildly abnormal.   Coronary calcium assessment not performed due to prior revascularization.   Electronically Signed: [name], MD   Per Robin Searing, NP: "Please let patient know that his cardiac PET showed no evidence of ischemia or infarct and was considered low to intermediate risk.  There were no extracardiac findings and left ventricular perfusion was normal.   Plan: -Please let patient know that he will be cleared for his upcoming procedure on 6/18.  Surgical clearance will be  sent to requesting provider. -Continue current antianginal support with isosorbide and metoprolol. -Please let me know if have any additional questions."   Echo 12/08/21:  IMPRESSIONS     1. Left ventricular ejection fraction, by estimation, is 55 to 60%. The  left ventricle has normal function. The left ventricle has no regional  wall motion abnormalities. Left ventricular diastolic parameters are  indeterminate.   2. Right ventricular systolic function is normal. The right ventricular  size is normal. There is normal pulmonary artery systolic pressure. The  estimated right ventricular systolic pressure is 31.5 mmHg.   3. The mitral valve is grossly normal. Mild mitral valve regurgitation.  No evidence of mitral stenosis.   4. The aortic valve is tricuspid. There is mild calcification of the  aortic valve. Aortic valve regurgitation is mild. Aortic valve sclerosis  is present, with no evidence of aortic valve stenosis.   5. The inferior vena cava is normal in size with greater than 50%  respiratory variability, suggesting right atrial pressure of 3 mmHg.    Left Heart Cath 11/28/21:    Mid LAD lesion is 40% stenosed.   1st Diag lesion is 40% stenosed.   Mid Cx to Dist Cx lesion is 40% stenosed.   Prox RCA to Mid RCA lesion is 90% stenosed.   A drug-eluting stent was successfully placed using a SYNERGY XD [id number].   Post intervention, there is a 0% residual stenosis.   The left ventricular systolic function is normal.   LV end diastolic pressure is normal.   The left ventricular ejection fraction is 55-65% by visual estimate.   Single vessel obstructive CAD involving the mid RCA Normal LV function Normal LVEDP Successful PCI of the mid RCA with DES x 1  Past Medical History:  Diagnosis Date   Acid reflux disease    ACID REFLUX DISEASE 07/05/2007   Acute gastric ulcer with bleeding    Anemia    Arthritis    Atherosclerosis    Benign neoplasm of colon 07/05/2007    Bilateral hip pain 10/05/2016   Bladder cancer (HCC)    Breast pain, left 11/17/2011   CAD (coronary artery disease)    Chest pain    a. Reportedly negative dobut echo performed prior to gastric bypass in 03/2011;  b. 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 12/2011 Cath: LM nl, LAD 50p, D1 78m, LCX min irregs, OM3 30, RCA 25p, 44m (FFR 0.99->0.89), PDA 30, EF 65%, Med Rx. d. Cath 11/2021 - 90% m RCA s/p PCI/DES x1   COLONIC POLYPS, HX OF 10/29/2008   Diarrhea 06/13/2010   Diverticulosis 07/05/2018   DIVERTICULOSIS, COLON 10/29/2008   DM (diabetes mellitus), type 2, uncontrolled    GI bleed    Gout 03/04/2013   Hearing loss 05/24/2013   Previous audiology evaluation completely.   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   Neck pain 03/2015   Other and unspecified hyperlipidemia 11/16/2012   Post-operative nausea and vomiting    after the surgery in hospital in March 2020/ past the cauderization   Preventative health care 11/17/2011   Raynaud's disease    Sleep apnea    a. CPAP   Spinal stenosis    Tear of meniscus of left knee 2012    Past Surgical History:  Procedure Laterality Date   ANKLE SURGERY Right 1994   BICEPS TENDON REPAIR     left side   CARDIAC CATHETERIZATION     denies any chest pain in the past 2 years   COLONOSCOPY     colonoscopy polyps     CORONARY STENT INTERVENTION N/A 11/28/2021   Procedure: CORONARY STENT INTERVENTION;  Surgeon: Swaziland, Peter M, MD;  Location: [facility] INVASIVE CV LAB;  Service: Cardiovascular;  Laterality: N/A;   ESOPHAGOGASTRODUODENOSCOPY N/A 03/27/2013   Procedure: ESOPHAGOGASTRODUODENOSCOPY (EGD);  Surgeon: Hilarie Fredrickson, MD;  Location: [facility];  Service:  Endoscopy;  Laterality: N/A;   ESOPHAGOGASTRODUODENOSCOPY (EGD) WITH PROPOFOL N/A 07/06/2018   Procedure: ESOPHAGOGASTRODUODENOSCOPY (EGD) WITH PROPOFOL;  Surgeon: Beverley Fiedler, MD;  Location: [facility] ENDOSCOPY;  Service: Gastroenterology;  Laterality: N/A;   ESOPHAGOGASTRODUODENOSCOPY (EGD) WITH PROPOFOL N/A 02/05/2022   Procedure: ESOPHAGOGASTRODUODENOSCOPY (EGD) WITH PROPOFOL;  Surgeon: Shellia Cleverly, DO;  Location: [facility] ENDOSCOPY;  Service: Gastroenterology;  Laterality: N/A;   GASTRIC BYPASS     HEMOSTASIS CLIP PLACEMENT  02/05/2022   Procedure: HEMOSTASIS CLIP PLACEMENT;  Surgeon: Shellia Cleverly, DO;  Location: [facility] ENDOSCOPY;  Service: Gastroenterology;;   HERNIA REPAIR     HOT HEMOSTASIS N/A 07/06/2018   Procedure: HOT HEMOSTASIS (ARGON PLASMA COAGULATION/BICAP);  Surgeon: Beverley Fiedler, MD;  Location: [facility];  Service: Gastroenterology;  Laterality: N/A;   KNEE ARTHROSCOPY Left 11/06/2010   Left, torn meniscus (repaired)   LEFT HEART CATH AND CORONARY ANGIOGRAPHY N/A 11/28/2021   Procedure: LEFT HEART CATH AND CORONARY ANGIOGRAPHY;  Surgeon: Swaziland, Peter M, MD;  Location[facility] INVASIVE CV LAB;  Service: Cardiovascular;  Laterality: N/A;   LEFT HEART CATHETERIZATION WITH CORONARY ANGIOGRAM N/A 12/23/2011   Procedure: LEFT HEART CATHETERIZATION WITH CORONARY ANGIOGRAM;  Surgeon: Rollene Rotunda, MD;  Location: [facility] CATH LAB;  Service: Cardiovascular;  Laterality: N/A;   REPLACEMENT TOTAL KNEE Right 01/2016   removed scar tissue/ cut tip of nerve bundle and re-route   right knee arthroscopy Right 07/05/14   Dr. Valma Cava, [facility].   ROTATOR CUFF REPAIR  2019   left   SCHLEROTHERAPY  07/06/2018   Procedure: SCHLEROTHERAPY;  Surgeon: Beverley Fiedler, MD;  Location: [facility];  Service: Gastroenterology;;   Susa Day  02/05/2022   Procedure: Susa Day;  Surgeon: Shellia Cleverly, DO;  Location: [facility];  Service: Gastroenterology;;   TONSILLECTOMY  age 72    TONSILLECTOMY     as a child   TOTAL KNEE ARTHROPLASTY Right 01/20/2016   Procedure: RIGHT TOTAL KNEE ARTHROPLASTY;  Surgeon: Durene Romans, MD;  Location: [facility];  Service: Orthopedics;  Laterality: Right;   TRANSURETHRAL RESECTION OF BLADDER TUMOR N/A 09/24/2020   Procedure: TRANSURETHRAL RESECTION OF BLADDER TUMOR (TURBT)  RIGHT retrograde pylegram;  Surgeon: Jerilee Field, MD;  Location: [facility];  Service: Urology;  Laterality: N/A;  UPPER GI ENDOSCOPY  03/27/13    MEDICATIONS:  Semaglutide,0.25 or 0.5MG /DOS, (OZEMPIC, 0.25 OR 0.5 MG/DOSE,) 2 MG/1.5ML SOPN   acetaminophen (TYLENOL) 500 MG tablet   amitriptyline (ELAVIL) 50 MG tablet   amoxicillin (AMOXIL) 500 MG capsule   aspirin EC 81 MG tablet   Cholecalciferol (VITAMIN D) 50 MCG (2000 UT) tablet   Cyanocobalamin (VITAMIN B-12) 1000 MCG SUBL   docusate sodium (COLACE) 100 MG capsule   EPINEPHrine 0.3 mg/0.3 mL IJ SOAJ injection   ezetimibe (ZETIA) 10 MG tablet   famotidine (PEPCID) 40 MG tablet   fenofibrate 160 MG tablet   Ferrous Gluconate-C-Folic Acid (IRON-C PO)   fluticasone (FLONASE) 50 MCG/ACT nasal spray   gabapentin (NEURONTIN) 300 MG capsule   hydrochlorothiazide (MICROZIDE) 12.5 MG capsule   isosorbide mononitrate (IMDUR) 30 MG 24 hr tablet   Krill Oil 1000 MG CAPS   losartan (COZAAR) 50 MG tablet   Magnesium 100 MG CAPS   methocarbamol (ROBAXIN) 500 MG tablet   methylPREDNISolone (MEDROL DOSEPAK) 4 MG TBPK tablet   metoprolol tartrate (LOPRESSOR) 100 MG tablet   mometasone (ELOCON) 0.1 % cream   Multiple Vitamin (MULTIVITAMIN) tablet   nitroGLYCERIN (NITROSTAT) 0.4 MG SL tablet   NON FORMULARY   oxycodone (OXY-IR) 5 MG capsule   pantoprazole (PROTONIX) 40 MG tablet   Pitavastatin Calcium (LIVALO) 2 MG TABS   No current facility-administered medications for this encounter.   Marcille Blanco [facility] Surgical Short Stay/Anesthesiology [facility] Phone [phone] 10/02/2022 3:44 PM

## 2022-10-06 ENCOUNTER — Ambulatory Visit (HOSPITAL_BASED_OUTPATIENT_CLINIC_OR_DEPARTMENT_OTHER): Payer: Medicare Other | Admitting: Anesthesiology

## 2022-10-06 ENCOUNTER — Encounter (HOSPITAL_COMMUNITY): Payer: Self-pay | Admitting: Urology

## 2022-10-06 ENCOUNTER — Ambulatory Visit (HOSPITAL_COMMUNITY): Payer: Medicare Other

## 2022-10-06 ENCOUNTER — Encounter (HOSPITAL_COMMUNITY)
Admission: RE | Disposition: A | Discharge status: Home / Self Care | Payer: Self-pay | Source: Ambulatory Visit | Attending: Urology

## 2022-10-06 ENCOUNTER — Ambulatory Visit (HOSPITAL_COMMUNITY): Payer: Medicare Other | Admitting: Physician Assistant

## 2022-10-06 ENCOUNTER — Ambulatory Visit (HOSPITAL_COMMUNITY)
Admission: RE | Admit: 2022-10-06 | Discharge: 2022-10-06 | Disposition: A | Discharge status: Home / Self Care | Payer: Medicare Other | Source: Ambulatory Visit | Attending: Urology | Admitting: Urology

## 2022-10-06 DIAGNOSIS — D414 Neoplasm of uncertain behavior of bladder: Secondary | ICD-10-CM

## 2022-10-06 DIAGNOSIS — Z7985 Long-term (current) use of injectable non-insulin antidiabetic drugs: Secondary | ICD-10-CM | POA: Diagnosis not present

## 2022-10-06 DIAGNOSIS — Z9884 Bariatric surgery status: Secondary | ICD-10-CM | POA: Diagnosis not present

## 2022-10-06 DIAGNOSIS — Z08 Encounter for follow-up examination after completed treatment for malignant neoplasm: Secondary | ICD-10-CM | POA: Diagnosis not present

## 2022-10-06 DIAGNOSIS — K219 Gastro-esophageal reflux disease without esophagitis: Secondary | ICD-10-CM | POA: Insufficient documentation

## 2022-10-06 DIAGNOSIS — Z87891 Personal history of nicotine dependence: Secondary | ICD-10-CM | POA: Diagnosis not present

## 2022-10-06 DIAGNOSIS — I10 Essential (primary) hypertension: Secondary | ICD-10-CM | POA: Diagnosis not present

## 2022-10-06 DIAGNOSIS — Z01818 Encounter for other preprocedural examination: Secondary | ICD-10-CM

## 2022-10-06 DIAGNOSIS — Z79899 Other long term (current) drug therapy: Secondary | ICD-10-CM | POA: Insufficient documentation

## 2022-10-06 DIAGNOSIS — E1100 Type 2 diabetes mellitus with hyperosmolarity without nonketotic hyperglycemic-hyperosmolar coma (NKHHC): Secondary | ICD-10-CM

## 2022-10-06 DIAGNOSIS — G4730 Sleep apnea, unspecified: Secondary | ICD-10-CM | POA: Diagnosis not present

## 2022-10-06 DIAGNOSIS — E1149 Type 2 diabetes mellitus with other diabetic neurological complication: Secondary | ICD-10-CM | POA: Diagnosis not present

## 2022-10-06 DIAGNOSIS — Z8551 Personal history of malignant neoplasm of bladder: Secondary | ICD-10-CM | POA: Insufficient documentation

## 2022-10-06 DIAGNOSIS — I2510 Atherosclerotic heart disease of native coronary artery without angina pectoris: Secondary | ICD-10-CM | POA: Insufficient documentation

## 2022-10-06 DIAGNOSIS — D630 Anemia in neoplastic disease: Secondary | ICD-10-CM | POA: Diagnosis not present

## 2022-10-06 DIAGNOSIS — C672 Malignant neoplasm of lateral wall of bladder: Secondary | ICD-10-CM | POA: Diagnosis not present

## 2022-10-06 DIAGNOSIS — E119 Type 2 diabetes mellitus without complications: Secondary | ICD-10-CM | POA: Insufficient documentation

## 2022-10-06 DIAGNOSIS — Z09 Encounter for follow-up examination after completed treatment for conditions other than malignant neoplasm: Secondary | ICD-10-CM | POA: Diagnosis not present

## 2022-10-06 DIAGNOSIS — C679 Malignant neoplasm of bladder, unspecified: Secondary | ICD-10-CM | POA: Diagnosis not present

## 2022-10-06 HISTORY — PX: CYSTOSCOPY WITH BIOPSY: SHX5122

## 2022-10-06 LAB — GLUCOSE, CAPILLARY
Glucose-Capillary: 166 mg/dL — ABNORMAL HIGH (ref 70–99)
Glucose-Capillary: 147 mg/dL — ABNORMAL HIGH (ref 70–99)

## 2022-10-06 SURGERY — CYSTOSCOPY, WITH BIOPSY
Anesthesia: General | Site: Bladder | Laterality: Bilateral

## 2022-10-06 MED ORDER — STERILE WATER FOR IRRIGATION IR SOLN
Status: DC | PRN
  Administered 2022-10-06: 3000 mL

## 2022-10-06 MED ORDER — PROPOFOL 10 MG/ML IV BOLUS
INTRAVENOUS | Status: DC | PRN
  Administered 2022-10-06: 150 mg via INTRAVENOUS

## 2022-10-06 MED ORDER — MIDAZOLAM HCL 5 MG/5ML IJ SOLN
INTRAMUSCULAR | Status: DC | PRN
  Administered 2022-10-06: 2 mg via INTRAVENOUS

## 2022-10-06 MED ORDER — LACTATED RINGERS IV SOLN: INTRAVENOUS | Status: DC

## 2022-10-06 MED ORDER — ONDANSETRON HCL 4 MG/2ML IJ SOLN: 4.0000 mg | Freq: Once | INTRAMUSCULAR | Status: DC | PRN

## 2022-10-06 MED ORDER — PROPOFOL 10 MG/ML IV BOLUS
INTRAVENOUS | Status: AC
  Filled 2022-10-06: qty 20

## 2022-10-06 MED ORDER — ORAL CARE MOUTH RINSE: 15.0000 mL | Freq: Once | OROMUCOSAL | Status: AC

## 2022-10-06 MED ORDER — IOHEXOL 300 MG/ML  SOLN
INTRAMUSCULAR | Status: DC | PRN
  Administered 2022-10-06: 10 mL

## 2022-10-06 MED ORDER — ONDANSETRON HCL 4 MG/2ML IJ SOLN
INTRAMUSCULAR | Status: AC
  Filled 2022-10-06: qty 2

## 2022-10-06 MED ORDER — OXYCODONE HCL 5 MG PO TABS: 5.0000 mg | ORAL_TABLET | Freq: Once | ORAL | Status: DC | PRN

## 2022-10-06 MED ORDER — CHLORHEXIDINE GLUCONATE 0.12 % MT SOLN
15.0000 mL | Freq: Once | OROMUCOSAL | Status: AC
  Administered 2022-10-06: 15 mL via OROMUCOSAL

## 2022-10-06 MED ORDER — GEMCITABINE CHEMO FOR BLADDER INSTILLATION 2000 MG
2000.0000 mg | Freq: Once | INTRAVENOUS | Status: DC
  Filled 2022-10-06: qty 52.6

## 2022-10-06 MED ORDER — CHLORHEXIDINE GLUCONATE 0.12 % MT SOLN: 15.0000 mL | Freq: Once | OROMUCOSAL | Status: DC

## 2022-10-06 MED ORDER — OXYCODONE HCL 5 MG/5ML PO SOLN: 5.0000 mg | Freq: Once | ORAL | Status: DC | PRN

## 2022-10-06 MED ORDER — ACETAMINOPHEN 500 MG PO TABS
1000.0000 mg | ORAL_TABLET | Freq: Once | ORAL | Status: AC
  Administered 2022-10-06: 1000 mg via ORAL
  Filled 2022-10-06: qty 2

## 2022-10-06 MED ORDER — MIDAZOLAM HCL 2 MG/2ML IJ SOLN
INTRAMUSCULAR | Status: AC
  Filled 2022-10-06: qty 2

## 2022-10-06 MED ORDER — ONDANSETRON HCL 4 MG/2ML IJ SOLN
INTRAMUSCULAR | Status: DC | PRN
  Administered 2022-10-06: 4 mg via INTRAVENOUS

## 2022-10-06 MED ORDER — 0.9 % SODIUM CHLORIDE (POUR BTL) OPTIME
TOPICAL | Status: DC | PRN
  Administered 2022-10-06: 1000 mL

## 2022-10-06 MED ORDER — GEMCITABINE CHEMO FOR BLADDER INSTILLATION 2000 MG
2000.0000 mg | Freq: Once | INTRAVENOUS | Status: AC
  Administered 2022-10-06: 2000 mg via INTRAVESICAL
  Filled 2022-10-06: qty 2000

## 2022-10-06 MED ORDER — ORAL CARE MOUTH RINSE: 15.0000 mL | Freq: Once | OROMUCOSAL | Status: DC

## 2022-10-06 MED ORDER — PHENYLEPHRINE 80 MCG/ML (10ML) SYRINGE FOR IV PUSH (FOR BLOOD PRESSURE SUPPORT)
PREFILLED_SYRINGE | INTRAVENOUS | Status: DC | PRN
  Administered 2022-10-06 (×5): 80 ug via INTRAVENOUS

## 2022-10-06 MED ORDER — CEFAZOLIN SODIUM-DEXTROSE 2-4 GM/100ML-% IV SOLN
2.0000 g | INTRAVENOUS | Status: AC
  Administered 2022-10-06: 2 g via INTRAVENOUS
  Filled 2022-10-06: qty 100

## 2022-10-06 MED ORDER — LIDOCAINE 2% (20 MG/ML) 5 ML SYRINGE
INTRAMUSCULAR | Status: DC | PRN
  Administered 2022-10-06: 80 mg via INTRAVENOUS

## 2022-10-06 MED ORDER — FENTANYL CITRATE (PF) 100 MCG/2ML IJ SOLN
INTRAMUSCULAR | Status: AC
  Filled 2022-10-06: qty 2

## 2022-10-06 MED ORDER — FENTANYL CITRATE PF 50 MCG/ML IJ SOSY: 25.0000 ug | PREFILLED_SYRINGE | INTRAMUSCULAR | Status: DC | PRN

## 2022-10-06 MED ORDER — FENTANYL CITRATE (PF) 100 MCG/2ML IJ SOLN
INTRAMUSCULAR | Status: DC | PRN
  Administered 2022-10-06 (×2): 50 ug via INTRAVENOUS

## 2022-10-06 MED ORDER — SODIUM CHLORIDE 0.9 % IR SOLN
Status: DC | PRN
  Administered 2022-10-06: 3000 mL

## 2022-10-06 MED ORDER — DEXAMETHASONE SODIUM PHOSPHATE 10 MG/ML IJ SOLN
INTRAMUSCULAR | Status: AC
  Filled 2022-10-06: qty 1

## 2022-10-06 MED ORDER — DEXAMETHASONE SODIUM PHOSPHATE 10 MG/ML IJ SOLN
INTRAMUSCULAR | Status: DC | PRN
  Administered 2022-10-06: 5 mg via INTRAVENOUS

## 2022-10-06 MED ORDER — INSULIN ASPART 100 UNIT/ML IJ SOLN
0.0000 [IU] | INTRAMUSCULAR | Status: DC | PRN
  Administered 2022-10-06: 2 [IU] via SUBCUTANEOUS
  Filled 2022-10-06: qty 1

## 2022-10-06 SURGICAL SUPPLY — 17 items
BAG DRN RND TRDRP ANRFLXCHMBR (UROLOGICAL SUPPLIES)
BAG URINE DRAIN 2000ML AR STRL (UROLOGICAL SUPPLIES) IMPLANT
BAG URO CATCHER STRL LF (MISCELLANEOUS) ×1 IMPLANT
CATH SILICONE 16FRX5CC (CATHETERS) IMPLANT
CATH URET 5FR 70CM CONE TIP (BALLOONS) IMPLANT
DRAPE FOOT SWITCH (DRAPES) ×1 IMPLANT
DRSG TELFA 3X8 NADH STRL (GAUZE/BANDAGES/DRESSINGS) IMPLANT
GLOVE SURG LX STRL 7.5 STRW (GLOVE) ×1 IMPLANT
GOWN STRL REUS W/ TWL XL LVL3 (GOWN DISPOSABLE) ×1 IMPLANT
GOWN STRL REUS W/TWL XL LVL3 (GOWN DISPOSABLE) ×1
GUIDEWIRE STR DUAL SENSOR (WIRE) IMPLANT
KIT TURNOVER KIT A (KITS) IMPLANT
LOOP CUT BIPOLAR 24F LRG (ELECTROSURGICAL) IMPLANT
MANIFOLD NEPTUNE II (INSTRUMENTS) ×1 IMPLANT
PACK CYSTO (CUSTOM PROCEDURE TRAY) ×1 IMPLANT
TUBING CONNECTING 10 (TUBING) ×1 IMPLANT
TUBING UROLOGY SET (TUBING) ×1 IMPLANT

## 2022-10-06 NOTE — Op Note (Signed)
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  Recent office cystoscopy revealed possible early recurrence at the right bladder wall and posterior superior.  He was brought for biopsies and further evaluation.  Findings: On cystoscopy there was some erythema and some early papillary changes of the right bladder wall at the edge of his adjacent resection and also posterior superior slightly to the left.  No specific papillary lesions were noted.  I thought overall it looked more benign in the office cystoscopy.  Left retrograde pyelogram-this outlined a single ureter single collecting system unit without filling defect, stricture or dilation.  There was brisk drainage.  The spinal stimulator was noted.  Right retrograde pyelogram-this outlined a single ureter single collecting system unit without filling defect, stricture or dilation.  There was brisk drainage.  On exam under anesthesia the penis was circumcised without mass or lesion.  The glans and meatus appeared normal.  The scrotum appeared normal.  The testicles were palpably normal without mass.  On DRE the prostate was about 30 g.  There was a nodule midline apically.  About 8 mm.  Description of procedure: After consent was obtained patient brought to the operating room.  After adequate anesthesia he was placed in lithotomy position and prepped and draped in the usual sterile fashion.  Timeout was performed to confirm patient and procedure.  The cystoscope was passed per urethra and the bladder carefully inspected with a 30 degree and 70 degree lens.  The left ureteral orifice was then cannulated with a 5 [place] open-ended catheter and left retrograde  injection of contrast was performed.  The right ureteral orifice was cannulated with a 5 [place] open-ended catheter but I could not get good injection so I swapped that out to a cone-tip which worked well.  Retrograde injection of contrast performed on the right.  We then turned our attention to the bladder.  The cold cup biopsy forceps were used to biopsy the posterior superior 2-3 times.  And then the right lateral x 1.  The entire area was fulgurated posterior superior about 15 mm and the area on the right fulgurated about 15 mm.  Hemostasis was excellent low-pressure.  The scope was backed out and a 16 [place] silicone catheter was placed.  It was left to gravity drainage with clear urine.  Exam under anesthesia was performed.  He was awakened taken recovery room in stable condition.  Postoperative instillation of gemcitabine in PACU: 2000 mg of gemcitabine was instilled into the bladder and allowed to dwell for 50 minutes.  Gemcitabine drained and Foley removed.  Complications: None  Blood loss: Minimal  Specimens to pathology: #1 posterior superior bladder #2 right bladder wall  Drains: 16 French silicone Foley catheter  Disposition: Patient stable to PACU.  I discussed the procedure, postop care and follow-up with [facility].

## 2022-10-06 NOTE — Anesthesia Preprocedure Evaluation (Addendum)
Anesthesia Evaluation  Patient identified by MRN, date of birth, ID band Patient awake    Reviewed: Allergy & Precautions, NPO status , Patient's Chart, lab work & pertinent test results, reviewed documented beta blocker date and time   History of Anesthesia Complications (+) PONV and history of anesthetic complications  Airway Mallampati: III  TM Distance: >3 FB Neck ROM: Full    Dental  (+) Dental Advisory Given, Teeth Intact   Pulmonary sleep apnea , former smoker   Pulmonary exam normal        Cardiovascular hypertension, Pt. on medications and Pt. on home beta blockers + CAD and + DOE  Normal cardiovascular exam   '24 NM PET Cardiac Scan - Findings are consistent with no ischemia and no infarction. The study is low to intermediate risk due to the presence of mildly reduced myocardial blood flow reserve. Given that perfusion is normal, there is no TID, and EF augments with stress, this is likely due to microvascular disease in the setting of CKD however we cannot rule out multivessel CAD. Recommend clinical correlation. EF 49% at rest, 59% with stress  '23 TTE - EF 55 to 60%. Mild mitral valve regurgitation. Aortic valve regurgitation is mild.     Neuro/Psych  Headaches  Neuromuscular disease    GI/Hepatic Neg liver ROS, PUD,GERD  Medicated and Controlled,, S/p gastric bypass    Endo/Other  diabetes, Type 2   Obesity   Renal/GU Renal InsufficiencyRenal disease     Musculoskeletal  (+) Arthritis ,    Abdominal   Peds  Hematology  (+) Blood dyscrasia, anemia   Anesthesia Other Findings On GLP-1a, last use >7 days ago    Reproductive/Obstetrics                             Anesthesia Physical Anesthesia Plan  ASA: 3  Anesthesia Plan: General   Post-op Pain Management: Minimal or no pain anticipated and Tylenol PO (pre-op)*   Induction: Intravenous  PONV Risk Score and Plan: 3  and Treatment may vary due to age or medical condition, Ondansetron, Dexamethasone and Propofol infusion  Airway Management Planned: LMA  Additional Equipment: None  Intra-op Plan:   Post-operative Plan: Extubation in OR  Informed Consent: I have reviewed the patients History and Physical, chart, labs and discussed the procedure including the risks, benefits and alternatives for the proposed anesthesia with the patient or authorized representative who has indicated his/her understanding and acceptance.     Dental advisory given  Plan Discussed with: CRNA and Anesthesiologist  Anesthesia Plan Comments:         Anesthesia Quick Evaluation

## 2022-10-06 NOTE — Transfer of Care (Signed)
Immediate Anesthesia Transfer of Care Note  Patient: Brent [name]  Procedure(s) Performed: CYSTOSCOPY WITH BIOPSY FULGURATION BILATERAL RETROGRADE PYELOGRAM INSTILL GEMCITABINE (Bilateral: Bladder)  Patient Location: PACU  Anesthesia Type:General  Level of Consciousness: drowsy  Airway & Oxygen Therapy: Patient Spontanous Breathing and Patient connected to face mask oxygen  Post-op Assessment: Report given to RN and Post -op Vital signs reviewed and stable  Post vital signs: Reviewed and stable  Last Vitals:  Vitals Value Taken Time  BP    Temp    Pulse    Resp    SpO2      Last Pain:  Vitals:   10/06/22 0758  TempSrc: Oral         Complications: No notable events documented.

## 2022-10-06 NOTE — Anesthesia Postprocedure Evaluation (Signed)
Anesthesia Post Note  Patient: Brent [name]  Procedure(s) Performed: CYSTOSCOPY WITH BIOPSY FULGURATION BILATERAL RETROGRADE PYELOGRAM INSTILL GEMCITABINE (Bilateral: Bladder)     Patient location during evaluation: PACU Anesthesia Type: General Level of consciousness: awake and alert Pain management: pain level controlled Vital Signs Assessment: post-procedure vital signs reviewed and stable Respiratory status: spontaneous breathing, nonlabored ventilation and respiratory function stable Cardiovascular status: stable and blood pressure returned to baseline Anesthetic complications: no   No notable events documented.  Last Vitals:  Vitals:   10/06/22 1230 10/06/22 1245  BP: 128/79 127/79  Pulse: 72 70  Resp: 15 14  Temp:    SpO2: 96% 94%    Last Pain:  Vitals:   10/06/22 1245  TempSrc:   PainSc: 0-No pain                 Beryle Lathe

## 2022-10-06 NOTE — Anesthesia Procedure Notes (Signed)
Procedure Name: LMA Insertion Date/Time: 10/06/2022 9:58 AM  Performed by: Marny Lowenstein, CRNAPre-anesthesia Checklist: Patient identified, Emergency Drugs available, Suction available and Patient being monitored Patient Re-evaluated:Patient Re-evaluated prior to induction Oxygen Delivery Method: Circle system utilized Preoxygenation: Pre-oxygenation with 100% oxygen Induction Type: IV induction Ventilation: Mask ventilation without difficulty LMA: LMA inserted LMA Size: 4.0 Number of attempts: 1 Placement Confirmation: positive ETCO2 and breath sounds checked- equal and bilateral Tube secured with: Tape Dental Injury: Teeth and Oropharynx as per pre-operative assessment

## 2022-10-06 NOTE — H&P (Signed)
H&P  Chief Complaint: bladder neoplasm   History of Present Illness: Brent [name] is a 67 year old male with history of low-grade TA bladder tumor in 2022.  He has done well without recurrence until a recent office cystoscopy April 2024 showed an early posterior recurrence.  He was brought today for cystoscopy with bilateral ureteroscopy, bladder biopsy and postoperative instillation of gemcitabine.  He has been well without dysuria or gross hematuria.  No cough cold or congestion.  No fever.  He underwent a nuclear medicine PET CT cardiac perfusion study and was cleared by cardiology.  Past Medical History:  Diagnosis Date   Acid reflux disease    ACID REFLUX DISEASE 07/05/2007   Acute gastric ulcer with bleeding    Anemia    Arthritis    Atherosclerosis    Benign neoplasm of colon 07/05/2007   Bilateral hip pain 10/05/2016   Bladder cancer (HCC)    Breast pain, left 11/17/2011   CAD (coronary artery disease)    Chest pain    a. Reportedly negative dobut echo performed prior to gastric bypass in 03/2011;  b. 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 12/2011 Cath: LM nl, LAD 50p, D1 49m, LCX min irregs, OM3 30, RCA 25p, 12m (FFR 0.99->0.89), PDA 30, EF 65%, Med Rx. d. Cath 11/2021 - 90% m RCA s/p PCI/DES x1   COLONIC POLYPS, HX OF 10/29/2008   Diarrhea 06/13/2010   Diverticulosis 07/05/2018   DIVERTICULOSIS, COLON 10/29/2008   DM (diabetes mellitus), type 2, uncontrolled    GI bleed    Gout 03/04/2013   Hearing loss 05/24/2013   Previous audiology evaluation completely.   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   Neck pain 03/2015   Other and unspecified hyperlipidemia 11/16/2012   Post-operative nausea and  vomiting    after the surgery in hospital in March 2020/ past the cauderization   Preventative health care 11/17/2011   Raynaud's disease    Sleep apnea    a. CPAP   Spinal stenosis    Tear of meniscus of left knee 2012   Past Surgical History:  Procedure Laterality Date   ANKLE SURGERY Right 1994   BICEPS TENDON REPAIR     left side   CARDIAC CATHETERIZATION     denies any chest pain in the past 2 years   COLONOSCOPY     colonoscopy polyps     CORONARY STENT INTERVENTION N/A 11/28/2021   Procedure: CORONARY STENT INTERVENTION;  Surgeon: Swaziland, Peter M, MD;  Location: [facility] INVASIVE CV LAB;  Service: Cardiovascular;  Laterality: N/A;   ESOPHAGOGASTRODUODENOSCOPY N/A 03/27/2013   Procedure: ESOPHAGOGASTRODUODENOSCOPY (EGD);  Surgeon: Hilarie Fredrickson, MD;  Location: [facility];  Service: Endoscopy;  Laterality: N/A;   ESOPHAGOGASTRODUODENOSCOPY (EGD) WITH PROPOFOL N/A 07/06/2018   Procedure: ESOPHAGOGASTRODUODENOSCOPY (EGD) WITH PROPOFOL;  Surgeon: Beverley Fiedler, MD;  Location: [facility];  Service: Gastroenterology;  Laterality: N/A;   ESOPHAGOGASTRODUODENOSCOPY (EGD) WITH PROPOFOL N/A 02/05/2022   Procedure: ESOPHAGOGASTRODUODENOSCOPY (EGD) WITH PROPOFOL;  Surgeon: Shellia Cleverly, DO;  Location: [facility];  Service: Gastroenterology;  Laterality: N/A;   GASTRIC BYPASS     HEMOSTASIS CLIP PLACEMENT  02/05/2022   Procedure: HEMOSTASIS CLIP  PLACEMENT;  Surgeon: Shellia Cleverly, DO;  Location: [facility];  Service: Gastroenterology;;   HERNIA REPAIR     HOT HEMOSTASIS N/A 07/06/2018   Procedure: HOT HEMOSTASIS (ARGON PLASMA COAGULATION/BICAP);  Surgeon: Beverley Fiedler, MD;  Location: [facility];  Service: Gastroenterology;  Laterality: N/A;   KNEE ARTHROSCOPY Left 11/06/2010   Left, torn meniscus (repaired)   LEFT HEART CATH AND CORONARY ANGIOGRAPHY N/A 11/28/2021   Procedure: LEFT HEART CATH AND CORONARY ANGIOGRAPHY;  Surgeon: Swaziland, Peter M, MD;  Location: [facility] INVASIVE CV LAB;   Service: Cardiovascular;  Laterality: N/A;   LEFT HEART CATHETERIZATION WITH CORONARY ANGIOGRAM N/A 12/23/2011   Procedure: LEFT HEART CATHETERIZATION WITH CORONARY ANGIOGRAM;  Surgeon: Rollene Rotunda, MD;  Location: [facility] CATH LAB;  Service: Cardiovascular;  Laterality: N/A;   REPLACEMENT TOTAL KNEE Right 01/2016   removed scar tissue/ cut tip of nerve bundle and re-route   right knee arthroscopy Right 07/05/14   Dr. Valma Cava, [facility].   ROTATOR CUFF REPAIR  2019   left   SCHLEROTHERAPY  07/06/2018   Procedure: SCHLEROTHERAPY;  Surgeon: Beverley Fiedler, MD;  Location: [facility];  Service: Gastroenterology;;   Susa Day  02/05/2022   Procedure: Susa Day;  Surgeon: Shellia Cleverly, DO;  Location: [facility];  Service: Gastroenterology;;   TONSILLECTOMY  age 35   TONSILLECTOMY     as a child   TOTAL KNEE ARTHROPLASTY Right 01/20/2016   Procedure: RIGHT TOTAL KNEE ARTHROPLASTY;  Surgeon: Durene Romans, MD;  Location: [facility];  Service: Orthopedics;  Laterality: Right;   TRANSURETHRAL RESECTION OF BLADDER TUMOR N/A 09/24/2020   Procedure: TRANSURETHRAL RESECTION OF BLADDER TUMOR (TURBT)  RIGHT retrograde pylegram;  Surgeon: Jerilee Field, MD;  Location: [facility];  Service: Urology;  Laterality: N/A;   UPPER GI ENDOSCOPY  03/27/13    Home Medications:  Medications Prior to Admission  Medication Sig Dispense Refill Last Dose   acetaminophen (TYLENOL) 500 MG tablet Take 1,000 mg by mouth every 6 (six) hours as needed for moderate pain or headache.   10/05/2022   amitriptyline (ELAVIL) 50 MG tablet TAKE 1 TABLET AT BEDTIME 90 tablet 3 10/05/2022   aspirin EC 81 MG tablet Take 1 tablet (81 mg total) by mouth daily. Swallow whole. 30 tablet 12 10/05/2022   Cholecalciferol (VITAMIN D) 50 MCG (2000 UT) tablet Take 6,000 Units by mouth daily.   10/05/2022   Cyanocobalamin (VITAMIN B-12) 1000 MCG SUBL Place 1,000 mcg under the tongue 2 (two) times a week.   Past Week   EPINEPHrine 0.3 mg/0.3  mL IJ SOAJ injection INJECT 0.3 MLS (0.3 MG TOTAL) INTO THE MUSCLE ONCE FOR 1 DOSE 2 each 11    ezetimibe (ZETIA) 10 MG tablet Take 1 tablet (10 mg total) by mouth daily. 90 tablet 3 10/06/2022 at 0600   fenofibrate 160 MG tablet TAKE 1 TABLET DAILY 90 tablet 0 10/06/2022 at 0600   fluticasone (FLONASE) 50 MCG/ACT nasal spray USE 2 SPRAYS IN EACH NOSTRIL DAILY 48 g 3 10/05/2022   gabapentin (NEURONTIN) 300 MG capsule Take 300 mg by mouth See admin instructions. Pt takes 3 tablets 900 mg is the AM, 1 tablet 300 mg in the afternoon, 3 tablets 900 mg in the evening, and 1 tablet 300 mg at bedtime for a total of 2400 mg daily.   10/06/2022 at 0600   hydrochlorothiazide (MICROZIDE) 12.5 MG capsule TAKE 1 CAPSULE DAILY 90 capsule 2 10/05/2022   isosorbide mononitrate (IMDUR) 30 MG 24 hr tablet  TAKE 2 TABLETS DAILY . CAN TAKE AN ADDITIONAL 30 MG TABLET AS NEEDED (INCREASED DOSE) 225 tablet 3 10/06/2022 at 0600   Krill Oil 1000 MG CAPS Take 1,000 mg by mouth daily.   10/05/2022   losartan (COZAAR) 50 MG tablet TAKE 1 TABLET DAILY (KEEP UPCOMING APPOINTMENT IN MARCH 2023 WITH CARDIOLOGIST BEFORE ANYMORE REFILLS) 90 tablet 3 10/06/2022 at 0600   Magnesium 100 MG CAPS Take 1 capsule (100 mg total) by mouth daily. 30 capsule 1 10/05/2022   methocarbamol (ROBAXIN) 500 MG tablet Take 1 tablet (500 mg total) by mouth 4 (four) times daily. 360 tablet 1 10/05/2022   metoprolol tartrate (LOPRESSOR) 100 MG tablet TAKE 1 TABLET TWICE A DAY 180 tablet 3 10/06/2022 at 0600   mometasone (ELOCON) 0.1 % cream Apply 1 Application topically daily. (Patient taking differently: Apply 1 Application topically daily as needed (irritation).) 90 g 1 Past Week   Multiple Vitamin (MULTIVITAMIN) tablet Take 1 tablet by mouth daily.   10/05/2022   NON FORMULARY Pt uses a cpap nightly   10/05/2022   oxycodone (OXY-IR) 5 MG capsule Take 5 mg by mouth in the morning, at noon, in the evening, and at bedtime.   10/05/2022   pantoprazole (PROTONIX) 40 MG  tablet TAKE 1 TABLET TWICE A DAY BEFORE MEALS 180 tablet 3 10/06/2022 at 0600   Pitavastatin Calcium (LIVALO) 2 MG TABS Take 1 tablet (2 mg total) by mouth daily. 90 tablet 3 10/05/2022   amoxicillin (AMOXIL) 500 MG capsule Take 2,000 mg by mouth See admin instructions. Take 2,000 mg prior to dental appointments   More than a month   docusate sodium (COLACE) 100 MG capsule Take 100 mg by mouth daily as needed for mild constipation.   Unknown   famotidine (PEPCID) 40 MG tablet TAKE 1 TABLET AT BEDTIME AS NEEDED FOR HEARTBURN OR INDIGESTION 90 tablet 3 More than a month   Ferrous Gluconate-C-Folic Acid (IRON-C PO) Take 1 tablet by mouth daily. Iron 65 mg and vit C-125 mg   More than a month   methylPREDNISolone (MEDROL DOSEPAK) 4 MG TBPK tablet 5 tabs po x 1 day then 4 tabs po x 1 day then 3 tabs po x 1 day then 2 tabs po x 1 day then 1 tab po x 1 day and stop 15 tablet 0 Not Taking   nitroGLYCERIN (NITROSTAT) 0.4 MG SL tablet Place 1 tablet (0.4 mg total) under the tongue every 5 (five) minutes as needed. 25 tablet 11 More than a month   Semaglutide,0.25 or 0.5MG /DOS, (OZEMPIC, 0.25 OR 0.5 MG/DOSE,) 2 MG/1.5ML SOPN Inject into the skin.      Allergies:  Allergies  Allergen Reactions   Bee Venom Anaphylaxis   Lipitor [Atorvastatin] Other (See Comments)    Myalgias, memory changes   Morphine Other (See Comments)    hyperactive   Metformin And Related Other (See Comments)    Myalgias and weakness   Lioresal [Baclofen] Other (See Comments)    Acted drunk   Medrol [Methylprednisolone] Other (See Comments)    Hyperglycemia    Zocor [Simvastatin] Other (See Comments)    Joint pain   Hydrocodone Itching   Latex Rash   Pravachol [Pravastatin] Other (See Comments)    Joint pain    Family History  Problem Relation Age of Onset   Diabetes Mother    Hypertension Mother    Stroke Mother    Hyperlipidemia Mother    Hypertension Father    Colon polyps Father  Heart attack Father 67   Stroke  Father    Heart attack Brother    Diabetes Brother    Heart disease Brother    Heart attack Brother        Multiple   Diabetes Brother    Diabetes Sister    Stroke Sister    Obesity Brother    Diabetes Brother    Heart disease Brother    Hypertension Maternal Grandmother    ADD / ADHD Daughter    Heart disease Brother    Stomach cancer Neg Hx    Colon cancer Neg Hx    Esophageal cancer Neg Hx    Rectal cancer Neg Hx    Social History:  reports that he quit smoking about 31 years ago. His smoking use included cigarettes. He has a 30.00 pack-year smoking history. He has never used smokeless tobacco. He reports current alcohol use of about 4.0 standard drinks of alcohol per week. He reports that he does not use drugs.  ROS: A complete review of systems was performed.  All systems are negative except for pertinent findings as noted. Review of Systems  All other systems reviewed and are negative.    Physical Exam:  Vital signs in last 24 hours: Temp:  [98.6 F (37 C)] 98.6 F (37 C) (06/18 0758) Pulse Rate:  [78] 78 (06/18 0758) Resp:  [16] 16 (06/18 0758) BP: (121)/(72) 121/72 (06/18 0758) SpO2:  [97 %] 97 % (06/18 0758) Weight:  [105.7 kg] 105.7 kg (06/18 0807) General:  Alert and oriented, No acute distress HEENT: Normocephalic, atraumatic Cardiovascular: Regular rate and rhythm Lungs: Regular rate and effort Abdomen: Soft, nontender, nondistended, no abdominal masses Back: No CVA tenderness Extremities: No edema Neurologic: Grossly intact  Laboratory Data:  Results for orders placed or performed during the hospital encounter of 10/06/22 (from the past 24 hour(s))  Glucose, capillary     Status: Abnormal   Collection Time: 10/06/22  8:02 AM  Result Value Ref Range   Glucose-Capillary 166 (H) 70 - 99 mg/dL   Comment 1 Notify RN    Comment 2 Document in Chart    No results found for this or any previous visit (from the past 240 hour(s)). Creatinine: Recent Labs     10/02/22 1404  CREATININE 1.28*    Impression/Assessment:  Bladder neoplasm-  Plan:  I discussed with the patient the nature, potential benefits, risks and alternatives to cystoscopy with bilateral retrograde pyelogram, bladder biopsy or TURBT and postoperative instillation of gemcitabine, including side effects of the proposed treatment, the likelihood of the patient achieving the goals of the procedure, and any potential problems that might occur during the procedure or recuperation.  His wife is a Engineer, civil (consulting).  We did discuss the possibility of a postop Foley catheter.  She would like a syringe if he goes home with the Foley in case he needs irrigation.  Evidently that was an issue last time.  All questions answered. Patient elects to proceed.   Jerilee Field 10/06/2022, 9:30 AM

## 2022-10-07 ENCOUNTER — Encounter (HOSPITAL_COMMUNITY): Payer: Self-pay | Admitting: Urology

## 2022-10-08 LAB — SURGICAL PATHOLOGY

## 2022-10-13 ENCOUNTER — Other Ambulatory Visit: Payer: Self-pay | Admitting: Family

## 2022-10-13 DIAGNOSIS — Z91030 Bee allergy status: Secondary | ICD-10-CM

## 2022-10-26 DIAGNOSIS — M5414 Radiculopathy, thoracic region: Secondary | ICD-10-CM | POA: Diagnosis not present

## 2022-10-26 DIAGNOSIS — M5134 Other intervertebral disc degeneration, thoracic region: Secondary | ICD-10-CM | POA: Diagnosis not present

## 2022-10-26 DIAGNOSIS — M4804 Spinal stenosis, thoracic region: Secondary | ICD-10-CM | POA: Diagnosis not present

## 2022-10-26 DIAGNOSIS — Z8551 Personal history of malignant neoplasm of bladder: Secondary | ICD-10-CM | POA: Diagnosis not present

## 2022-10-26 DIAGNOSIS — G894 Chronic pain syndrome: Secondary | ICD-10-CM | POA: Diagnosis not present

## 2022-10-26 DIAGNOSIS — R310 Gross hematuria: Secondary | ICD-10-CM | POA: Diagnosis not present

## 2022-10-30 ENCOUNTER — Inpatient Hospital Stay: Payer: Medicare Other | Attending: Hematology & Oncology

## 2022-10-30 DIAGNOSIS — D508 Other iron deficiency anemias: Secondary | ICD-10-CM | POA: Diagnosis not present

## 2022-10-30 DIAGNOSIS — K909 Intestinal malabsorption, unspecified: Secondary | ICD-10-CM | POA: Diagnosis not present

## 2022-10-30 DIAGNOSIS — D500 Iron deficiency anemia secondary to blood loss (chronic): Secondary | ICD-10-CM

## 2022-10-30 LAB — FERRITIN: Ferritin: 116 ng/mL (ref 24–336)

## 2022-10-30 LAB — CBC WITH DIFFERENTIAL (CANCER CENTER ONLY)
Abs Immature Granulocytes: 0.02 10*3/uL (ref 0.00–0.07)
Basophils Absolute: 0 10*3/uL (ref 0.0–0.1)
Basophils Relative: 1 %
Eosinophils Absolute: 0.2 10*3/uL (ref 0.0–0.5)
Eosinophils Relative: 4 %
HCT: 38.8 % — ABNORMAL LOW (ref 39.0–52.0)
Hemoglobin: 12.7 g/dL — ABNORMAL LOW (ref 13.0–17.0)
Immature Granulocytes: 0 %
Lymphocytes Relative: 36 %
Lymphs Abs: 2.1 10*3/uL (ref 0.7–4.0)
MCH: 30 pg (ref 26.0–34.0)
MCHC: 32.7 g/dL (ref 30.0–36.0)
MCV: 91.5 fL (ref 80.0–100.0)
Monocytes Absolute: 0.6 10*3/uL (ref 0.1–1.0)
Monocytes Relative: 11 %
Neutro Abs: 2.9 10*3/uL (ref 1.7–7.7)
Neutrophils Relative %: 48 %
Platelet Count: 237 10*3/uL (ref 150–400)
RBC: 4.24 MIL/uL (ref 4.22–5.81)
RDW: 14.2 % (ref 11.5–15.5)
WBC Count: 6 10*3/uL (ref 4.0–10.5)
nRBC: 0 % (ref 0.0–0.2)

## 2022-10-30 LAB — IRON AND IRON BINDING CAPACITY (CC-WL,HP ONLY)
Iron: 82 ug/dL (ref 45–182)
Saturation Ratios: 22 % (ref 17.9–39.5)
TIBC: 382 ug/dL (ref 250–450)
UIBC: 300 ug/dL (ref 117–376)

## 2022-11-18 ENCOUNTER — Encounter (INDEPENDENT_AMBULATORY_CARE_PROVIDER_SITE_OTHER): Payer: Self-pay

## 2022-11-19 ENCOUNTER — Other Ambulatory Visit: Payer: Self-pay | Admitting: Family Medicine

## 2022-11-19 DIAGNOSIS — M5412 Radiculopathy, cervical region: Secondary | ICD-10-CM | POA: Diagnosis not present

## 2022-11-19 DIAGNOSIS — M48061 Spinal stenosis, lumbar region without neurogenic claudication: Secondary | ICD-10-CM | POA: Diagnosis not present

## 2022-11-19 DIAGNOSIS — M5136 Other intervertebral disc degeneration, lumbar region: Secondary | ICD-10-CM | POA: Diagnosis not present

## 2022-11-19 DIAGNOSIS — M5030 Other cervical disc degeneration, unspecified cervical region: Secondary | ICD-10-CM | POA: Diagnosis not present

## 2022-12-02 ENCOUNTER — Other Ambulatory Visit: Payer: Self-pay | Admitting: Cardiology

## 2022-12-04 ENCOUNTER — Telehealth: Payer: Self-pay | Admitting: Family Medicine

## 2022-12-04 NOTE — Telephone Encounter (Signed)
Copied from CRM [id number]. Topic: Medicare AWV >> Dec 04, 2022  1:54 PM Payton Doughty wrote: Reason for CRM: LM 12/04/2022 to schedule AWV   Verlee Rossetti; Care Guide Ambulatory Clinical Support [place] [facility] Direct Dial: [phone]

## 2022-12-07 DIAGNOSIS — G4733 Obstructive sleep apnea (adult) (pediatric): Secondary | ICD-10-CM | POA: Diagnosis not present

## 2022-12-07 DIAGNOSIS — G4730 Sleep apnea, unspecified: Secondary | ICD-10-CM | POA: Diagnosis not present

## 2022-12-08 ENCOUNTER — Telehealth: Payer: Medicare Other | Admitting: Family Medicine

## 2022-12-08 ENCOUNTER — Encounter: Payer: Self-pay | Admitting: Family Medicine

## 2022-12-08 ENCOUNTER — Telehealth (INDEPENDENT_AMBULATORY_CARE_PROVIDER_SITE_OTHER): Payer: Medicare Other | Admitting: Family Medicine

## 2022-12-08 DIAGNOSIS — E782 Mixed hyperlipidemia: Secondary | ICD-10-CM | POA: Diagnosis not present

## 2022-12-08 DIAGNOSIS — E1100 Type 2 diabetes mellitus with hyperosmolarity without nonketotic hyperglycemic-hyperosmolar coma (NKHHC): Secondary | ICD-10-CM | POA: Diagnosis not present

## 2022-12-08 DIAGNOSIS — Z7985 Long-term (current) use of injectable non-insulin antidiabetic drugs: Secondary | ICD-10-CM

## 2022-12-08 DIAGNOSIS — D649 Anemia, unspecified: Secondary | ICD-10-CM

## 2022-12-08 DIAGNOSIS — I10 Essential (primary) hypertension: Secondary | ICD-10-CM | POA: Diagnosis not present

## 2022-12-08 DIAGNOSIS — K219 Gastro-esophageal reflux disease without esophagitis: Secondary | ICD-10-CM

## 2022-12-08 NOTE — Progress Notes (Signed)
Virtual Video Visit via MyChart Note  I connected with  Brent [name] on 12/08/22 at  9:40 AM EDT by the video enabled telemedicine application for MyChart, and verified that I am speaking with the correct person using two identifiers.   I introduced myself as a Publishing rights manager with the practice. We discussed the limitations of evaluation and management by telemedicine and the availability of in person appointments. The patient expressed understanding and agreed to proceed.  Participating parties in this visit include: The patient and the nurse practitioner listed.  The patient is: At home I am: In the office - [facility]  Subjective:    CC: chronic disease management  HPI: Brent [name] is a 67 y.o. year old male presenting today via MyChart today for chronic disease management.  Discussed the use of AI scribe software for clinical note transcription with the patient, who gave verbal consent to proceed.  History of Present Illness   The patient, with a history of hypertension, diabetes, and gastroesophageal reflux disease, presents for a routine six-month follow-up. They report stable blood pressure readings, with an average of <150/70. Their most recent A1c was 5.8, indicating good glycemic control. They also report a stable weight of 237 lbs.  The patient had a cardiology consultation in the spring and has since transitioned from Plavix to aspirin. They continue to take hydrochlorothiazide, Lopressor, losartan, and Imdur for blood pressure management, and pantoprazole or Pepcid as needed for reflux. They report no issues with these medications.  For diabetes management, they are under the care of an endocrinologist and are currently on Ozempic. They had some difficulty obtaining certain diabetes medications in the past due to availability issues, but have settled on Ozempic which is working well. No acute concerns today. Due to see endocrinology in  2-3 months.   The patient is also on a statin and Zetia for cholesterol management, and Fenofibrate. They take over-the-counter B12 twice a week and vitamin D daily.  They have a history of anemia and their hemoglobin levels are monitored every three to six months by the cancer center. They have been stable recently.  The patient reports no major health concerns at this time and is not in need of any medication refills. They are scheduled for follow-up appointments with cardiology, hematology, endocrinology, and pain management in the coming months.          Past medical history, Surgical history, Family history not pertinant except as noted below, Social history, Allergies, and medications have been entered into the medical record, reviewed, and corrections made.   Review of Systems:  All review of systems negative except what is listed in the HPI   Objective:    General:  Speaking clearly in complete sentences. Absent shortness of breath noted.   Alert and oriented x3.   Normal judgment.  Absent acute distress.   Impression and Recommendations:    Problem List Items Addressed This Visit     T2DM (type 2 diabetes mellitus) (HCC) - Primary    Well controlled with Ozempic 2mg , last A1c was 5.8 in May. -Continue current regimen and follow-up with endocrinology on 02/08/2023.      Essential hypertension    Controlled with hydrochlorothiazide, Lopressor, losartan, and Imdur. -Continue current regimen and lifestyle measures       Hyperlipidemia, mixed    On pravastatin and Zetia. -Continue current regimen. Lifestyle factors for lowering cholesterol include: Diet therapy - heart-healthy diet rich in fruits,  veggies, fiber-rich whole grains, lean meats, chicken, fish (at least twice a week), fat-free or 1% dairy products; foods low in saturated/trans fats, cholesterol, sodium, and sugar. Mediterranean diet has shown to be very heart healthy. Regular exercise - recommend at  least 30 minutes a day, 5 times per week Weight management        Anemia    Stable, monitored by the cancer center with iron infusions as necessary. -Continue current monitoring and treatment plan.      Gastroesophageal reflux disease    Controlled with pantoprazole or Pepcid as needed. -Continue current regimen.      Other Visit Diagnoses     Long-term current use of injectable noninsulin antidiabetic medication              Follow-up if symptoms worsen or fail to improve.    I discussed the assessment and treatment plan with the patient. The patient was provided an opportunity to ask questions and all were answered. The patient agreed with the plan and demonstrated an understanding of the instructions.   The patient was advised to call back or seek an in-person evaluation if the symptoms worsen or if the condition fails to improve as anticipated.   Clayborne Dana, NP

## 2022-12-08 NOTE — Assessment & Plan Note (Signed)
Well controlled with Ozempic 2mg , last A1c was 5.8 in May. -Continue current regimen and follow-up with endocrinology on 02/08/2023.

## 2022-12-08 NOTE — Assessment & Plan Note (Signed)
On pravastatin and Zetia. -Continue current regimen. Lifestyle factors for lowering cholesterol include: Diet therapy - heart-healthy diet rich in fruits, veggies, fiber-rich whole grains, lean meats, chicken, fish (at least twice a week), fat-free or 1% dairy products; foods low in saturated/trans fats, cholesterol, sodium, and sugar. Mediterranean diet has shown to be very heart healthy. Regular exercise - recommend at least 30 minutes a day, 5 times per week Weight management

## 2022-12-08 NOTE — Assessment & Plan Note (Signed)
Controlled with hydrochlorothiazide, Lopressor, losartan, and Imdur. -Continue current regimen and lifestyle measures

## 2022-12-08 NOTE — Assessment & Plan Note (Signed)
Stable, monitored by the cancer center with iron infusions as necessary. -Continue current monitoring and treatment plan.

## 2022-12-08 NOTE — Assessment & Plan Note (Signed)
Controlled with pantoprazole or Pepcid as needed. -Continue current regimen.

## 2022-12-09 ENCOUNTER — Other Ambulatory Visit: Payer: Self-pay | Admitting: Family Medicine

## 2022-12-30 ENCOUNTER — Other Ambulatory Visit: Payer: Self-pay

## 2022-12-30 DIAGNOSIS — K909 Intestinal malabsorption, unspecified: Secondary | ICD-10-CM

## 2022-12-30 DIAGNOSIS — M5134 Other intervertebral disc degeneration, thoracic region: Secondary | ICD-10-CM | POA: Diagnosis not present

## 2022-12-30 DIAGNOSIS — M5030 Other cervical disc degeneration, unspecified cervical region: Secondary | ICD-10-CM | POA: Diagnosis not present

## 2022-12-30 DIAGNOSIS — M4804 Spinal stenosis, thoracic region: Secondary | ICD-10-CM | POA: Diagnosis not present

## 2022-12-30 DIAGNOSIS — M542 Cervicalgia: Secondary | ICD-10-CM | POA: Diagnosis not present

## 2022-12-30 DIAGNOSIS — M5414 Radiculopathy, thoracic region: Secondary | ICD-10-CM | POA: Diagnosis not present

## 2022-12-30 DIAGNOSIS — M5412 Radiculopathy, cervical region: Secondary | ICD-10-CM | POA: Diagnosis not present

## 2022-12-30 DIAGNOSIS — Z9689 Presence of other specified functional implants: Secondary | ICD-10-CM | POA: Diagnosis not present

## 2022-12-30 DIAGNOSIS — Z5181 Encounter for therapeutic drug level monitoring: Secondary | ICD-10-CM | POA: Diagnosis not present

## 2022-12-30 DIAGNOSIS — Z79899 Other long term (current) drug therapy: Secondary | ICD-10-CM | POA: Diagnosis not present

## 2022-12-30 DIAGNOSIS — G894 Chronic pain syndrome: Secondary | ICD-10-CM | POA: Diagnosis not present

## 2022-12-31 ENCOUNTER — Inpatient Hospital Stay: Payer: Medicare Other | Attending: Hematology & Oncology

## 2022-12-31 DIAGNOSIS — K909 Intestinal malabsorption, unspecified: Secondary | ICD-10-CM

## 2022-12-31 DIAGNOSIS — D508 Other iron deficiency anemias: Secondary | ICD-10-CM | POA: Insufficient documentation

## 2022-12-31 LAB — CBC WITH DIFFERENTIAL (CANCER CENTER ONLY)
Abs Immature Granulocytes: 0.02 10*3/uL (ref 0.00–0.07)
Basophils Absolute: 0 10*3/uL (ref 0.0–0.1)
Basophils Relative: 1 %
Eosinophils Absolute: 0.2 10*3/uL (ref 0.0–0.5)
Eosinophils Relative: 3 %
HCT: 36.3 % — ABNORMAL LOW (ref 39.0–52.0)
Hemoglobin: 11.8 g/dL — ABNORMAL LOW (ref 13.0–17.0)
Immature Granulocytes: 0 %
Lymphocytes Relative: 28 %
Lymphs Abs: 1.7 10*3/uL (ref 0.7–4.0)
MCH: 29.7 pg (ref 26.0–34.0)
MCHC: 32.5 g/dL (ref 30.0–36.0)
MCV: 91.4 fL (ref 80.0–100.0)
Monocytes Absolute: 0.5 10*3/uL (ref 0.1–1.0)
Monocytes Relative: 9 %
Neutro Abs: 3.6 10*3/uL (ref 1.7–7.7)
Neutrophils Relative %: 59 %
Platelet Count: 208 10*3/uL (ref 150–400)
RBC: 3.97 MIL/uL — ABNORMAL LOW (ref 4.22–5.81)
RDW: 12.8 % (ref 11.5–15.5)
WBC Count: 6 10*3/uL (ref 4.0–10.5)
nRBC: 0 % (ref 0.0–0.2)

## 2022-12-31 LAB — FERRITIN: Ferritin: 66 ng/mL (ref 24–336)

## 2023-01-01 ENCOUNTER — Ambulatory Visit: Payer: Medicare Other | Admitting: Cardiology

## 2023-01-01 LAB — IRON AND IRON BINDING CAPACITY (CC-WL,HP ONLY)
Iron: 82 ug/dL (ref 45–182)
Saturation Ratios: 21 % (ref 17.9–39.5)
TIBC: 399 ug/dL (ref 250–450)
UIBC: 317 ug/dL (ref 117–376)

## 2023-01-04 ENCOUNTER — Other Ambulatory Visit: Payer: Self-pay | Admitting: Nurse Practitioner

## 2023-01-04 DIAGNOSIS — R0989 Other specified symptoms and signs involving the circulatory and respiratory systems: Secondary | ICD-10-CM

## 2023-01-04 DIAGNOSIS — R0609 Other forms of dyspnea: Secondary | ICD-10-CM

## 2023-01-09 ENCOUNTER — Encounter: Payer: Self-pay | Admitting: Family Medicine

## 2023-01-11 ENCOUNTER — Other Ambulatory Visit: Payer: Self-pay | Admitting: Family Medicine

## 2023-01-11 DIAGNOSIS — L989 Disorder of the skin and subcutaneous tissue, unspecified: Secondary | ICD-10-CM

## 2023-01-26 ENCOUNTER — Ambulatory Visit: Payer: Medicare Other | Admitting: Cardiology

## 2023-01-29 ENCOUNTER — Other Ambulatory Visit: Payer: Self-pay | Admitting: Family Medicine

## 2023-02-08 DIAGNOSIS — E1165 Type 2 diabetes mellitus with hyperglycemia: Secondary | ICD-10-CM | POA: Diagnosis not present

## 2023-02-10 DIAGNOSIS — Z8551 Personal history of malignant neoplasm of bladder: Secondary | ICD-10-CM | POA: Diagnosis not present

## 2023-03-02 ENCOUNTER — Other Ambulatory Visit: Payer: Self-pay

## 2023-03-02 DIAGNOSIS — K909 Intestinal malabsorption, unspecified: Secondary | ICD-10-CM

## 2023-03-03 ENCOUNTER — Inpatient Hospital Stay: Payer: Medicare Other | Attending: Hematology & Oncology

## 2023-03-03 ENCOUNTER — Other Ambulatory Visit: Payer: Self-pay

## 2023-03-03 ENCOUNTER — Encounter: Payer: Self-pay | Admitting: Medical Oncology

## 2023-03-03 ENCOUNTER — Inpatient Hospital Stay (HOSPITAL_BASED_OUTPATIENT_CLINIC_OR_DEPARTMENT_OTHER): Payer: Medicare Other | Admitting: Medical Oncology

## 2023-03-03 ENCOUNTER — Inpatient Hospital Stay: Payer: Medicare Other

## 2023-03-03 VITALS — BP 130/58 | HR 79 | Temp 98.0°F | Resp 19 | Ht 68.0 in | Wt 242.0 lb

## 2023-03-03 DIAGNOSIS — Z9884 Bariatric surgery status: Secondary | ICD-10-CM | POA: Diagnosis not present

## 2023-03-03 DIAGNOSIS — D500 Iron deficiency anemia secondary to blood loss (chronic): Secondary | ICD-10-CM | POA: Diagnosis not present

## 2023-03-03 DIAGNOSIS — K909 Intestinal malabsorption, unspecified: Secondary | ICD-10-CM

## 2023-03-03 DIAGNOSIS — K912 Postsurgical malabsorption, not elsewhere classified: Secondary | ICD-10-CM | POA: Diagnosis not present

## 2023-03-03 DIAGNOSIS — Z79899 Other long term (current) drug therapy: Secondary | ICD-10-CM | POA: Diagnosis not present

## 2023-03-03 DIAGNOSIS — D508 Other iron deficiency anemias: Secondary | ICD-10-CM | POA: Diagnosis not present

## 2023-03-03 LAB — CBC WITH DIFFERENTIAL (CANCER CENTER ONLY)
Abs Immature Granulocytes: 0.01 10*3/uL (ref 0.00–0.07)
Basophils Absolute: 0 10*3/uL (ref 0.0–0.1)
Basophils Relative: 1 %
Eosinophils Absolute: 0.1 10*3/uL (ref 0.0–0.5)
Eosinophils Relative: 3 %
HCT: 36.2 % — ABNORMAL LOW (ref 39.0–52.0)
Hemoglobin: 11.8 g/dL — ABNORMAL LOW (ref 13.0–17.0)
Immature Granulocytes: 0 %
Lymphocytes Relative: 29 %
Lymphs Abs: 1.3 10*3/uL (ref 0.7–4.0)
MCH: 29.6 pg (ref 26.0–34.0)
MCHC: 32.6 g/dL (ref 30.0–36.0)
MCV: 91 fL (ref 80.0–100.0)
Monocytes Absolute: 0.4 10*3/uL (ref 0.1–1.0)
Monocytes Relative: 10 %
Neutro Abs: 2.5 10*3/uL (ref 1.7–7.7)
Neutrophils Relative %: 57 %
Platelet Count: 196 10*3/uL (ref 150–400)
RBC: 3.98 MIL/uL — ABNORMAL LOW (ref 4.22–5.81)
RDW: 13 % (ref 11.5–15.5)
WBC Count: 4.3 10*3/uL (ref 4.0–10.5)
nRBC: 0 % (ref 0.0–0.2)

## 2023-03-03 LAB — FERRITIN: Ferritin: 79 ng/mL (ref 24–336)

## 2023-03-03 NOTE — Progress Notes (Signed)
Hematology and Oncology Follow Up Visit  Brent [name] [id number] 19-Aug-1955 67 y.o. 03/03/2023   Principle Diagnosis:  Iron deficiency anemia secondary to malabsorption and history of acute GI bleed   Current Therapy:        IV iron as indicated - Venofer 300 mg- last infusion- 04/03/2022   Interim History:  Brent [name] is here today for follow-up.   Today he reports that he is doing well. He was recently diagnosed with chronic PTSD. He has begun treatment for this; he is hopeful that this will help him.  He continues to be followed by Urology for his bladder cancer No blood loss noted. No abnormal bruising, no petechiae.  No fever, chills, n/v, cough, rash, dizziness, SOB, chest pain, palpitations, abdominal pain or changes in bowel or bladder habits.  No swelling or tenderness in his extremities.  No falls or syncope.  Appetite and hydration are good.  Wt Readings from Last 3 Encounters:  03/03/23 242 lb (109.8 kg)  10/06/22 233 lb (105.7 kg)  10/02/22 233 lb (105.7 kg)   ECOG Performance Status: 1 - Symptomatic but completely ambulatory  Medications:  Allergies as of 03/03/2023       Reactions   Bee Venom Anaphylaxis   Lipitor [atorvastatin] Other (See Comments)   Myalgias, memory changes   Morphine Other (See Comments)   hyperactive   Metformin And Related Other (See Comments)   Myalgias and weakness   Lioresal [baclofen] Other (See Comments)   Acted drunk   Medrol [methylprednisolone] Other (See Comments)   Hyperglycemia    Zocor [simvastatin] Other (See Comments)   Joint pain   Hydrocodone Itching   Latex Rash   Pravachol [pravastatin] Other (See Comments)   Joint pain        Medication List        Accurate as of March 03, 2023  2:49 PM. If you have any questions, ask your nurse or doctor.          acetaminophen 500 MG tablet Commonly known as: TYLENOL Take 1,000 mg by mouth every 6 (six) hours as needed for moderate pain or headache.    amitriptyline 50 MG tablet Commonly known as: ELAVIL TAKE 1 TABLET AT BEDTIME   amoxicillin 500 MG capsule Commonly known as: AMOXIL Take 2,000 mg by mouth See admin instructions. Take 2,000 mg prior to dental appointments   aspirin EC 81 MG tablet Take 1 tablet (81 mg total) by mouth daily. Swallow whole.   docusate sodium Brent MG capsule Commonly known as: COLACE Take Brent mg by mouth daily as needed for mild constipation.   EPINEPHrine 0.3 mg/0.3 mL Soaj injection Commonly known as: EPI-PEN Inject 0.3 mg into the muscle as needed for anaphylaxis. INJECT 0.3 MLS (0.3 MG TOTAL) INTO THE MUSCLE ONCE FOR 1 DOSE AS DIRECTED   ezetimibe 10 MG tablet Commonly known as: ZETIA TAKE 1 TABLET DAILY   famotidine 40 MG tablet Commonly known as: PEPCID TAKE 1 TABLET AT BEDTIME AS NEEDED FOR HEARTBURN OR INDIGESTION   fenofibrate 160 MG tablet TAKE 1 TABLET DAILY   fluticasone 50 MCG/ACT nasal spray Commonly known as: FLONASE Place 2 sprays into both nostrils daily.   gabapentin 300 MG capsule Commonly known as: NEURONTIN Take 300 mg by mouth See admin instructions. Pt takes 3 tablets 900 mg is the AM, 1 tablet 300 mg in the afternoon, 3 tablets 900 mg in the evening, and 1 tablet 300 mg at bedtime for a total of  2400 mg daily.   hydrochlorothiazide 12.5 MG capsule Commonly known as: MICROZIDE TAKE 1 CAPSULE DAILY   IRON-C PO Take 1 tablet by mouth daily. Iron 65 mg and vit C-125 mg   isosorbide mononitrate 30 MG 24 hr tablet Commonly known as: IMDUR TAKE 2 TABLETS DAILY . CAN TAKE AN ADDITIONAL 30 MG TABLET AS NEEDED (INCREASED DOSE)   Krill Oil 1000 MG Caps Take 1,000 mg by mouth daily.   losartan 50 MG tablet Commonly known as: COZAAR TAKE 1 TABLET DAILY (KEEP UPCOMING APPOINTMENT IN MARCH 2023 WITH CARDIOLOGIST BEFORE ANYMORE REFILLS)   Magnesium Brent MG Caps Take 1 capsule (Brent mg total) by mouth daily.   methocarbamol 500 MG tablet Commonly known as: ROBAXIN Take  1 tablet (500 mg total) by mouth 4 (four) times daily.   methylPREDNISolone 4 MG Tbpk tablet Commonly known as: MEDROL DOSEPAK 5 tabs po x 1 day then 4 tabs po x 1 day then 3 tabs po x 1 day then 2 tabs po x 1 day then 1 tab po x 1 day and stop   metoprolol tartrate Brent MG tablet Commonly known as: LOPRESSOR TAKE 1 TABLET TWICE A DAY   mometasone 0.1 % cream Commonly known as: Elocon Apply 1 Application topically daily. What changed:  when to take this reasons to take this   multivitamin tablet Take 1 tablet by mouth daily.   nitroGLYCERIN 0.4 MG SL tablet Commonly known as: Nitrostat Place 1 tablet (0.4 mg total) under the tongue every 5 (five) minutes as needed.   NON FORMULARY Pt uses a cpap nightly   oxycodone 5 MG capsule Commonly known as: OXY-IR Take 5 mg by mouth in the morning, at noon, in the evening, and at bedtime.   Ozempic (0.25 or 0.5 MG/DOSE) 2 MG/1.5ML Sopn Generic drug: Semaglutide(0.25 or 0.5MG /DOS) Inject into the skin.   pantoprazole 40 MG tablet Commonly known as: PROTONIX Take 1 tablet (40 mg total) by mouth 2 (two) times daily before a meal.   Pitavastatin Calcium 2 MG Tabs Take 1 tablet (2 mg total) by mouth daily.   Vitamin B-12 1000 MCG Subl Place 1,000 mcg under the tongue 2 (two) times a week.   Vitamin D 50 MCG (2000 UT) tablet Take 6,000 Units by mouth daily.        Allergies:  Allergies  Allergen Reactions   Bee Venom Anaphylaxis   Lipitor [Atorvastatin] Other (See Comments)    Myalgias, memory changes   Morphine Other (See Comments)    hyperactive   Metformin And Related Other (See Comments)    Myalgias and weakness   Lioresal [Baclofen] Other (See Comments)    Acted drunk   Medrol [Methylprednisolone] Other (See Comments)    Hyperglycemia    Zocor [Simvastatin] Other (See Comments)    Joint pain   Hydrocodone Itching   Latex Rash   Pravachol [Pravastatin] Other (See Comments)    Joint pain    Past Medical  History, Surgical history, Social history, and Family History were reviewed and updated.  Review of Systems: All other 10 point review of systems is negative.   Physical Exam:  height is 5\' 8"  (1.727 m) and weight is 242 lb (109.8 kg). His oral temperature is 98 F (36.7 C). His blood pressure is 130/58 (abnormal) and his pulse is 79. His respiration is 19 and oxygen saturation is 99%.   Wt Readings from Last 3 Encounters:  03/03/23 242 lb (109.8 kg)  10/06/22 233 lb (  105.7 kg)  10/02/22 233 lb (105.7 kg)   General: AAx3.  Ocular: Sclerae unicteric, pupils equal, round and reactive to light Ear-nose-throat: Oropharynx clear, dentition fair Lymphatic: No cervical or supraclavicular adenopathy Lungs no rales or rhonchi, good excursion bilaterally Heart regular rate and rhythm, no murmur appreciated Neuro: non-focal, well-oriented, appropriate affect  Lab Results  Component Value Date   WBC 4.3 03/03/2023   HGB 11.8 (L) 03/03/2023   HCT 36.2 (L) 03/03/2023   MCV 91.0 03/03/2023   PLT 196 03/03/2023   Lab Results  Component Value Date   FERRITIN 66 12/31/2022   IRON 82 12/31/2022   TIBC 399 12/31/2022   UIBC 317 12/31/2022   IRONPCTSAT 21 12/31/2022   Lab Results  Component Value Date   RETICCTPCT 1.5 05/04/2022   RBC 3.98 (L) 03/03/2023   No results found for: "KPAFRELGTCHN", "LAMBDASER", "KAPLAMBRATIO" No results found for: "IGGSERUM", "IGA", "IGMSERUM" No results found for: "TOTALPROTELP", "ALBUMINELP", "A1GS", "A2GS", "BETS", "BETA2SER", "GAMS", "MSPIKE", "SPEI"   Chemistry      Component Value Date/Time   NA 137 10/02/2022 1404   NA 139 12/08/2021 1121   K 4.4 10/02/2022 1404   CL 104 10/02/2022 1404   CO2 24 10/02/2022 1404   BUN 25 (H) 10/02/2022 1404   BUN 21 12/08/2021 1121   CREATININE 1.28 (H) 10/02/2022 1404   CREATININE 1.19 11/18/2021 1131   CREATININE 1.19 01/11/2020 1543      Component Value Date/Time   CALCIUM 9.0 10/02/2022 1404   ALKPHOS 39  (L) 03/23/2022 0733   AST 29 03/23/2022 0733   AST 21 11/18/2021 1131   ALT 16 03/23/2022 0733   ALT 15 11/18/2021 1131   BILITOT 0.3 03/23/2022 0733   BILITOT 0.7 11/18/2021 1131     Encounter Diagnoses  Name Primary?   Iron deficiency anemia due to chronic blood loss Yes   Malabsorption of iron    Impression and Plan: Brent [name] is a very pleasant Brent [name] with iron deficiency anemia secondary to malabsorption (gastric bypass) as well intermittent GI blood loss with ulcer and bladder cancer.   Hgb is stable at 11.8 today Iron studies are pending. Will replace if needed RTC 4 months APP, labs (CBC, iron, ferritin) (if stable his next follow up can be in [address]  Rushie Chestnut, PA-C 11/13/20242:49 PM

## 2023-03-04 LAB — IRON AND IRON BINDING CAPACITY (CC-WL,HP ONLY)
Iron: 65 ug/dL (ref 45–182)
Saturation Ratios: 19 % (ref 17.9–39.5)
TIBC: 337 ug/dL (ref 250–450)
UIBC: 272 ug/dL (ref 117–376)

## 2023-03-07 ENCOUNTER — Other Ambulatory Visit: Payer: Self-pay | Admitting: Family

## 2023-03-07 ENCOUNTER — Other Ambulatory Visit: Payer: Self-pay | Admitting: Cardiology

## 2023-03-07 NOTE — Progress Notes (Unsigned)
Cardiology Office Note:  .   Date:  03/08/2023  ID:  Brent [name], DOB November 12, 1955, MRN [id number] PCP: Bradd Canary, MD   HeartCare Providers Cardiologist:  Donato Schultz, MD {  History of Present Illness: .   Brent [name] is a 67 y.o. male with a past medical history of CAD status post DES to RCA, diabetes mellitus type 2, OSA (on CPAP), GI bleed, HTN, HLD, morbid obesity status post gastric bypass 2012, anemia, IDA who presents today for follow-up appointment.  Was last seen 07/13/2022.  History includes initial visit in 2013 for ED workup complaining of chest pain.  Patient endorsed 7 out of 10 left upper chest pain and shoulder aching.  Underwent LHC which revealed diffuse nonobstructive CAD by FFR with moderate stenosis in the mid RCA but not flow-limiting.  Placed on optimize GDMT.  Had a Lexiscan in 2016 which was low risk with a normal LVEF.  Developed palpitations in 2021 and wore an event monitor that revealed sinus rhythm and no evidence of AF or pauses.  Underwent Lexi scan 07/2021 that was low risk with normal EF  Was seen by [name], NP 11/24/2021 with complaint of worsening SOB and with exertion and extreme fatigue.  LHC was recommended and patient underwent cath that revealed single-vessel obstructive LAD in the mid RCA with successful PCI/DES x 1.  Placed on DAPT with ASA and Plavix x 6 months.  2D echo was completed showing EF 55 to 60% with mild AI and mild MR. Seen 12/11/2021 with ongoing dyspnea and chest discomfort.  Reported resolution with nitro at that time.  Presented to the ED 01/07/2022 after starting chest discomfort which did not improve with 4 doses of nitro.  EKG and chest x-ray completed with normal troponins.  Patient is followed by cardiology who recommended increasing Imdur to 60 mg daily.  He is continue Plavix 2/24 and was only on ASA alone 81 mg.  When he was last seen in March 2024 he had been experiencing some stable angina which was  occurring over the last few weeks.  Last episode occurred 3 days prior and was relieved with nitroglycerin.  Denied crushing chest pain or increased palpitations with these episodes.  Compliant with current medications.  Blood pressure well-controlled.  Heart rate 73 bpm.  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  Stated he was not exercising due to severe arthritis.  He denied palpitations, dyspnea, PND, orthopnea, nausea, vomiting, dizziness, syncope, edema, weight gain, early satiety.  Today, he presents with a history of chest pain, has been managing his symptoms with nitroglycerin and isosorbide. The chest pain typically starts around midday and is relieved with one nitroglycerin pill. Over the past week, the patient has needed to take nitroglycerin daily, but has not experienced any chest pain since Friday. The patient is currently taking two 30mg  tablets of isosorbide daily, one in the morning and one in the evening.  In addition to the chest pain, the patient has been diagnosed with PTSD and chronic pain. He is currently on a variety of medications for these conditions, including an SSRI (Lexapro), gabapentin, and prednisone. The patient also has sleep apnea, which is managed with a sleep apnea machine. 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  Reports no shortness of breath nor dyspnea on exertion. Reports no chest pain, pressure, or tightness. No edema, orthopnea, PND. Reports no palpitations.  Discussed the use of AI scribe software for clinical note transcription with the patient, who gave verbal consent to proceed.  ROS: Pertinent ROS in HPI  Studies Reviewed: [name]   EKG Interpretation Date/Time:  Monday March 08 2023 15:49:38 EST Ventricular Rate:  68 PR Interval:  150 QRS Duration:  76 QT Interval:  434 QTC Calculation: 461 R Axis:   39  Text Interpretation: Normal sinus rhythm Normal  ECG When compared with ECG of 04-Feb-2022 19:00, PREVIOUS ECG IS PRESENT Confirmed by [name] [phone]) on 03/08/2023 4:13:35 PM   NM PET cardiac 09/29/22  Findings are consistent with no ischemia and no infarction. The study is low to intermediate risk due to the presence of mildly reduced myocardial blood flow reserve. Given that perfusion is normal, there is no TID, and EF augments with stress, this is likely due to microvascular disease in the setting of CKD however we cannot rule out multivessel CAD. Recommend clinical correlation.   LV perfusion is normal. There is no evidence of ischemia. There is no evidence of infarction.   Rest left ventricular function is normal. Rest EF: 49 %. Stress left ventricular function is normal. Stress EF: 59 %. End diastolic cavity size is normal. End systolic cavity size is normal.   Myocardial blood flow was computed to be 0.73ml/g/min at rest and 1.19ml/g/min at stress. Global myocardial blood flow reserve was 1.70 and was mildly abnormal.   Coronary calcium assessment not performed due to prior revascularization.   Electronically Signed: [name], MD       Physical Exam:   VS:  BP 126/76   Pulse 68   Ht 5\' 8"  (1.727 m)   Wt 243 lb 9.6 oz (110.5 kg)   SpO2 96%   BMI 37.04 kg/m    Wt Readings from Last 3 Encounters:  03/08/23 243 lb 9.6 oz (110.5 kg)  03/03/23 242 lb (109.8 kg)  10/06/22 233 lb (105.7 kg)    GEN: Well nourished, well developed in no acute distress NECK: No JVD; No carotid bruits CARDIAC: RRR, no murmurs, rubs, gallops RESPIRATORY:  Clear to auscultation without rales, wheezing or rhonchi  ABDOMEN: Soft, non-tender, non-distended EXTREMITIES:  No edema; No deformity   ASSESSMENT AND PLAN: .    Hypertension -well controlled today -recommended continuing current medications -continue low sodium, heart healthy diet   Stable Angina/ Hx CAD Daily chest pain relieved by nitroglycerin. Currently on Isosorbide 30mg   twice daily. Discussed increasing Isosorbide to improve symptom control and reduce need for nitroglycerin. -Increase Isosorbide to 45mg  twice daily. -Contact via MyChart in 1 week to assess response to increased Isosorbide dose.  Hyperlipidemia Last lipid panel was 1 year ago. -Order fasting lipid panel. Continue current medications  Chronic Pain Managed with Oxycontin and Prednisone as needed. -Continue current regimen.  Sleep Apnea Well controlled with CPAP machine. -Continue CPAP use.  Physical Activity Considering water aerobics for exercise. -Encourage participation in water aerobics.  PTSD Recently started on Escitalopram (Lexapro) 10mg  daily by the [place]. -Continue Escitalopram 10mg  daily.      Dispo: He can follow-up in 6 months or sooner if needed.  Signed, [name], PA-C

## 2023-03-08 ENCOUNTER — Ambulatory Visit: Payer: Medicare Other | Attending: Physician Assistant | Admitting: Physician Assistant

## 2023-03-08 ENCOUNTER — Encounter: Payer: Self-pay | Admitting: Physician Assistant

## 2023-03-08 VITALS — BP 126/76 | HR 68 | Ht 68.0 in | Wt 243.6 lb

## 2023-03-08 DIAGNOSIS — R0609 Other forms of dyspnea: Secondary | ICD-10-CM | POA: Diagnosis not present

## 2023-03-08 DIAGNOSIS — E785 Hyperlipidemia, unspecified: Secondary | ICD-10-CM | POA: Diagnosis not present

## 2023-03-08 DIAGNOSIS — I10 Essential (primary) hypertension: Secondary | ICD-10-CM

## 2023-03-08 DIAGNOSIS — I2089 Other forms of angina pectoris: Secondary | ICD-10-CM

## 2023-03-08 MED ORDER — NITROGLYCERIN 0.4 MG SL SUBL: 0.4000 mg | SUBLINGUAL_TABLET | SUBLINGUAL | 3 refills | Status: DC | PRN

## 2023-03-08 NOTE — Patient Instructions (Signed)
Medication Instructions:  Increase Isosorbide (Imdur) to 45 mg twice a day. This pill does not come in 45 mg, so you will have to take 1 whole tablet and 1/2 a tablet   *If you need a refill on your cardiac medications before your next appointment, please call your pharmacy*   Lab Work: Your physician recommends that you return for a FASTING lipid profile and hepatic function test at your earliest convenience. No appointment needed   If you have labs (blood work) drawn today and your tests are completely normal, you will receive your results only by: MyChart Message (if you have MyChart) OR A paper copy in the mail If you have any lab test that is abnormal or we need to change your treatment, we will call you to review the results.   Testing/Procedures: None ordered    Follow-Up: At [facility], you and your health needs are our priority.  As part of our continuing mission to provide you with exceptional heart care, we have created designated Provider Care Teams.  These Care Teams include your primary Cardiologist (physician) and Advanced Practice Providers (APPs -  Physician Assistants and Nurse Practitioners) who all work together to provide you with the care you need, when you need it.  We recommend signing up for the patient portal called "MyChart".  Sign up information is provided on this After Visit Summary.  MyChart is used to connect with patients for Virtual Visits (Telemedicine).  Patients are able to view lab/test results, encounter notes, upcoming appointments, etc.  Non-urgent messages can be sent to your provider as well.   To learn more about what you can do with MyChart, go to [email or web address].    Your next appointment:   6 month(s)  Provider:   Donato Schultz, MD     Other Instructions

## 2023-03-22 DIAGNOSIS — M5030 Other cervical disc degeneration, unspecified cervical region: Secondary | ICD-10-CM | POA: Diagnosis not present

## 2023-03-22 DIAGNOSIS — M51361 Other intervertebral disc degeneration, lumbar region with lower extremity pain only: Secondary | ICD-10-CM | POA: Diagnosis not present

## 2023-03-22 DIAGNOSIS — M542 Cervicalgia: Secondary | ICD-10-CM | POA: Diagnosis not present

## 2023-03-22 DIAGNOSIS — M5412 Radiculopathy, cervical region: Secondary | ICD-10-CM | POA: Diagnosis not present

## 2023-03-22 DIAGNOSIS — M48061 Spinal stenosis, lumbar region without neurogenic claudication: Secondary | ICD-10-CM | POA: Diagnosis not present

## 2023-03-26 ENCOUNTER — Encounter: Payer: Self-pay | Admitting: Family Medicine

## 2023-03-26 NOTE — Telephone Encounter (Addendum)
Care team updated and letter sent for eye exam notes. Assuming patient is still seeing [facility].

## 2023-05-18 ENCOUNTER — Other Ambulatory Visit: Payer: Self-pay | Admitting: Family Medicine

## 2023-05-19 ENCOUNTER — Other Ambulatory Visit: Payer: Self-pay | Admitting: Family Medicine

## 2023-06-06 NOTE — Assessment & Plan Note (Signed)
 Supplement and monitor

## 2023-06-06 NOTE — Assessment & Plan Note (Signed)
Avoid offending foods, start probiotics. Do not eat large meals in late evening and consider raising head of bed.  

## 2023-06-06 NOTE — Assessment & Plan Note (Signed)
 On CPAP. ?

## 2023-06-06 NOTE — Assessment & Plan Note (Signed)
hydrate and monitor  

## 2023-06-06 NOTE — Assessment & Plan Note (Signed)
 Well controlled, no changes to meds. Encouraged heart healthy diet such as the DASH diet and exercise as tolerated.

## 2023-06-06 NOTE — Assessment & Plan Note (Signed)
 [id number] acceptable, minimize simple carbs. Increase exercise as tolerated. Continue current meds

## 2023-06-07 ENCOUNTER — Ambulatory Visit (INDEPENDENT_AMBULATORY_CARE_PROVIDER_SITE_OTHER): Payer: Medicare Other | Admitting: Family Medicine

## 2023-06-07 VITALS — BP 130/74 | HR 74 | Temp 97.8°F | Resp 18 | Ht 68.0 in | Wt 249.4 lb

## 2023-06-07 DIAGNOSIS — E782 Mixed hyperlipidemia: Secondary | ICD-10-CM | POA: Diagnosis not present

## 2023-06-07 DIAGNOSIS — Z7985 Long-term (current) use of injectable non-insulin antidiabetic drugs: Secondary | ICD-10-CM | POA: Diagnosis not present

## 2023-06-07 DIAGNOSIS — I10 Essential (primary) hypertension: Secondary | ICD-10-CM

## 2023-06-07 DIAGNOSIS — E538 Deficiency of other specified B group vitamins: Secondary | ICD-10-CM

## 2023-06-07 DIAGNOSIS — M1A9XX0 Chronic gout, unspecified, without tophus (tophi): Secondary | ICD-10-CM | POA: Diagnosis not present

## 2023-06-07 DIAGNOSIS — E1100 Type 2 diabetes mellitus with hyperosmolarity without nonketotic hyperglycemic-hyperosmolar coma (NKHHC): Secondary | ICD-10-CM

## 2023-06-07 DIAGNOSIS — Z9884 Bariatric surgery status: Secondary | ICD-10-CM

## 2023-06-07 DIAGNOSIS — G4733 Obstructive sleep apnea (adult) (pediatric): Secondary | ICD-10-CM

## 2023-06-07 DIAGNOSIS — K909 Intestinal malabsorption, unspecified: Secondary | ICD-10-CM

## 2023-06-07 DIAGNOSIS — E559 Vitamin D deficiency, unspecified: Secondary | ICD-10-CM | POA: Diagnosis not present

## 2023-06-07 DIAGNOSIS — K219 Gastro-esophageal reflux disease without esophagitis: Secondary | ICD-10-CM | POA: Diagnosis not present

## 2023-06-07 NOTE — Patient Instructions (Signed)
Managing Post-Traumatic Stress Disorder If you have been diagnosed with post-traumatic stress disorder (PTSD), you may be relieved that you now know why you have felt or behaved a certain way. Still, you may feel overwhelmed and concerned for your future.  With the right treatment and support, you can live your life with fewer symptoms. What actions can I take to manage PTSD? Manage stress Stress is your body's reaction to life changes and events, both good and bad. Stress can make PTSD worse. Take the following steps to manage stress: Talk with your health care provider or a counselor about ways to lower your stress. These may include: Physical exercise. Meditation, yoga, or other mind-body exercises. Exercises to relax your muscles. Breathing exercises. Listening to quiet music. Spending time outside. Eat a healthy diet. Get plenty of sleep. Take time to relax. Spend time with others. Talk with them about how you are feeling and what you need. Do activities that you enjoy. Have a schedule and take regular breaks. Take your medicines Symptoms of PTSD can make you feel depressed and anxious. They can also disrupt reality. Medicines can help lessen symptoms and make you feel better. It is important to: Talk with your pharmacist or health care provider about all medicines you take. Talk about the side effects. Discuss which medicines are safe to take together. Be involved in your care. Decide on the best treatment plan with your health care provider. Be aware that if you decide to take medicine, it can take 4-8 weeks before you notice a difference in your symptoms. Talk to your health care provider before stopping medicines. Stay close to family and friends PTSD can affect relationships. Make an effort to: Trust your friends and loved ones. Trusting others can help you feel safe and connect you with emotional support. Be open and honest about your feelings. Have fun and relax in safe  spaces, such as with friends and family. Think about going to couples counseling, family education classes, or family therapy. Therapy can be helpful for everyone. Talk with friends and family about how they can best support you. What are signs that my condition is getting worse? Be aware of your symptoms and how often you have them. The following symptoms mean that you may need additional help: You feel suspicious and angry. You have repeated flashbacks. You avoid going out or being with others. You have more fights with close friends or family members. You have thoughts about hurting yourself or others. You cannot get relief from feelings of depression or anxiety. Follow these instructions at home: Lifestyle Exercise at least 30 minutes a few times a week. Try to get 7-9 hours of sleep each night. To help with sleep: Keep your bedroom cool and dark. Do not eat a heavy meal within 1 hour of bedtime. Avoid caffeine for at least 8 hours before bed. Avoid screen time before bed. This includes television, computers, tablets, and mobile phones. Try to have fun and find humor in your life. Eat a healthy diet. Write your thoughts and feelings down in a journal or on a tablet or mobile phone. General instructions Take over-the-counter and prescription medicines only as told by your health care provider. Do not useillegal drugs. Know and remember that healing from trauma takes time. Keep your health care team up to date about your PTSD symptoms and treatment. Alcohol use Do not drink alcohol if: Your health care provider tells you not to drink. You are pregnant, may be pregnant, or are   planning to become pregnant. If you drink alcohol: Limit how much you have to: 0-1 drink a day for women. 0-2 drinks a day for men. Know how much alcohol is in your drink. In the [place] one drink equals one 12 oz bottle of beer (355 mL), one 5 oz glass of wine (148 mL), or one 1 oz glass of hard liquor (44  mL). Where to find more information For more information about PTSD, treatment, and how to get support, go to: National Institute of Mental Health: nimh.nih.gov National Center for PTSD: ptsd.va.gov Contact a health care provider if: Your symptoms get worse or do not get better. You have trouble doing daily tasks. You have loss of appetite. You have trouble sleeping. Get help right away if: You have thoughts about hurting yourself or others. Get help right away if you feel like you may hurt yourself or others, or have thoughts about taking your own life. Go to your nearest emergency room or: Call 911. Call the National Suicide Prevention Lifeline at 1-800-273-8255 or 988. This is open 24 hours a day. Text the Crisis Text Line at 741741. Summary With the right treatment and support, you can live your life with fewer symptoms. Find supportive people and settings. Spend time in those places. Keep in contact with those people. Work with your health care team to create a plan to manage the symptoms of PTSD. The plan should include counseling, good habits, and techniques to reduce stress. This information is not intended to replace advice given to you by your health care provider. Make sure you discuss any questions you have with your health care provider. Document Revised: 07/25/2021 Document Reviewed: 07/25/2021 Elsevier Patient Education  2024 [employer]  

## 2023-06-09 ENCOUNTER — Encounter: Payer: Self-pay | Admitting: Family Medicine

## 2023-06-09 ENCOUNTER — Ambulatory Visit: Payer: Medicare Other | Admitting: Nurse Practitioner

## 2023-06-09 NOTE — Progress Notes (Signed)
 Subjective:    Patient ID: Brent [name], male    DOB: 1955-11-04, 68 y.o.   MRN: [id number]  Chief Complaint  Patient presents with  . Follow-up    6 month    HPI Discussed the use of AI scribe software for clinical note transcription with the patient, who gave verbal consent to proceed.  History of Present Illness   The patient, with a history of diabetes and PTSD, presents for a routine follow-up. The patient reports [facility] and is currently awaiting a formal evaluation for disability. The patient acknowledges the diagnosis and reports no new or worsening symptoms. The patient is currently on Lexapro 20mg , which was recently increased from 10mg  by his psychiatrist. The patient reports a weight gain of about 5-6 pounds since the medication increase, but no other side effects.  In terms of diabetes management, the patient is on Ozempic, with the last recorded A1C in October being 6.1. The patient reports working part-time and maintaining a relatively active lifestyle. The patient also discusses the impact of technology on mental health, expressing concerns about the addictive nature of electronic devices.  Patient is accompanied by his wife.        Past Medical History:  Diagnosis Date  . Acid reflux disease   . ACID REFLUX DISEASE 07/05/2007  . Acute gastric ulcer with bleeding   . Anemia   . Arthritis   . Atherosclerosis   . Benign neoplasm of colon 07/05/2007  . Bilateral hip pain 10/05/2016  . Bladder cancer (HCC)   . Breast pain, left 11/17/2011  . CAD (coronary artery disease)   . Chest pain    a. Reportedly negative dobut echo performed prior to gastric bypass in 03/2011;  b. 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 12/2011 Cath: LM nl, LAD 50p, D1 36m, LCX min irregs, OM3 30, RCA 25p, 38m (FFR 0.99->0.89), PDA 30, EF 65%, Med Rx. d. Cath 11/2021 - 90% m RCA s/p PCI/DES x1  . COLONIC POLYPS, HX OF 10/29/2008  . Diarrhea 06/13/2010   . Diverticulosis 07/05/2018  . DIVERTICULOSIS, COLON 10/29/2008  . DM (diabetes mellitus), type 2, uncontrolled   . GI bleed   . Gout 03/04/2013  . Hearing loss 05/24/2013   Previous audiology evaluation completely.  [name] History of kidney stones   . HTN (hypertension) 07/14/2010  . Hx of colonic polyps   . HYPERSOMNIA, ASSOCIATED WITH SLEEP APNEA 07/26/2008  . Hypertension 07/14/2010  . 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 Internal hemorrhoids   . Knee pain, left 10/10/2010  . Morbid obesity (HCC) 03/27/2010   a. 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  [name] Neck pain 03/2015  . Other and unspecified hyperlipidemia 11/16/2012  . Post-operative nausea and vomiting    after the surgery in hospital in March 2020/ past the cauderization  . Preventative health care 11/17/2011  . Raynaud's disease   . Sleep apnea    a. CPAP  . Spinal stenosis   . Tear of meniscus of left knee 2012    Past Surgical History:  Procedure Laterality Date  . ANKLE SURGERY Right 1994  . BICEPS TENDON REPAIR     left side  . CARDIAC CATHETERIZATION     denies any chest pain in the past 2 years  . COLONOSCOPY    . colonoscopy polyps    . CORONARY STENT INTERVENTION N/A 11/28/2021   Procedure: CORONARY STENT INTERVENTION;  Surgeon: Swaziland, Peter M, MD;  Location: [facility];  Service: Cardiovascular;  Laterality: N/A;  . CYSTOSCOPY WITH BIOPSY Bilateral 10/06/2022   Procedure: CYSTOSCOPY WITH BIOPSY FULGURATION BILATERAL RETROGRADE PYELOGRAM INSTILL GEMCITABINE;  Surgeon: Jerilee Field, MD;  Location: [facility];  Service: Urology;  Laterality: Bilateral;  1 HR FOR CASE  . ESOPHAGOGASTRODUODENOSCOPY N/A 03/27/2013   Procedure: ESOPHAGOGASTRODUODENOSCOPY (EGD);  Surgeon: Hilarie Fredrickson, MD;  Location: [facility];  Service: Endoscopy;  Laterality: N/A;  . ESOPHAGOGASTRODUODENOSCOPY (EGD) WITH PROPOFOL N/A 07/06/2018   Procedure: ESOPHAGOGASTRODUODENOSCOPY (EGD) WITH PROPOFOL;  Surgeon: Beverley Fiedler, MD;   Location: [facility];  Service: Gastroenterology;  Laterality: N/A;  . ESOPHAGOGASTRODUODENOSCOPY (EGD) WITH PROPOFOL N/A 02/05/2022   Procedure: ESOPHAGOGASTRODUODENOSCOPY (EGD) WITH PROPOFOL;  Surgeon: Shellia Cleverly, DO;  Location: [facility];  Service: Gastroenterology;  Laterality: N/A;  . GASTRIC BYPASS    . HEMOSTASIS CLIP PLACEMENT  02/05/2022   Procedure: HEMOSTASIS CLIP PLACEMENT;  Surgeon: Shellia Cleverly, DO;  Location: [facility];  Service: Gastroenterology;;  . HERNIA REPAIR    . HOT HEMOSTASIS N/A 07/06/2018   Procedure: HOT HEMOSTASIS (ARGON PLASMA COAGULATION/BICAP);  Surgeon: Beverley Fiedler, MD;  Location: [facility];  Service: Gastroenterology;  Laterality: N/A;  . KNEE ARTHROSCOPY Left 11/06/2010   Left, torn meniscus (repaired)  . LEFT HEART CATH AND CORONARY ANGIOGRAPHY N/A 11/28/2021   Procedure: LEFT HEART CATH AND CORONARY ANGIOGRAPHY;  Surgeon: Swaziland, Peter M, MD;  Location: [facility] CV [facility];  Service: Cardiovascular;  Laterality: N/A;  . LEFT HEART CATHETERIZATION WITH CORONARY ANGIOGRAM N/A 12/23/2011   Procedure: LEFT HEART CATHETERIZATION WITH CORONARY ANGIOGRAM;  Surgeon: Rollene Rotunda, MD;  Location: [facility] CATH LAB;  Service: Cardiovascular;  Laterality: N/A;  . REPLACEMENT TOTAL KNEE Right 01/2016   removed scar tissue/ cut tip of nerve bundle and re-route  . right knee arthroscopy Right 07/05/14   Dr. Valma Cava, [facility].  [name] ROTATOR CUFF REPAIR  2019   left  . SCHLEROTHERAPY  07/06/2018   Procedure: Theresia Majors;  Surgeon: Beverley Fiedler, MD;  Location: [facility];  Service: Gastroenterology;;  . Susa Day  02/05/2022   Procedure: Susa Day;  Surgeon: Shellia Cleverly, DO;  Location: [facility];  Service: Gastroenterology;;  . TONSILLECTOMY  age 20  . TONSILLECTOMY     as a child  . TOTAL KNEE ARTHROPLASTY Right 01/20/2016   Procedure: RIGHT TOTAL KNEE ARTHROPLASTY;  Surgeon: Durene Romans, MD;  Location: [facility];  Service:  Orthopedics;  Laterality: Right;  . TRANSURETHRAL RESECTION OF BLADDER TUMOR N/A 09/24/2020   Procedure: TRANSURETHRAL RESECTION OF BLADDER TUMOR (TURBT)  RIGHT retrograde pylegram;  Surgeon: Jerilee Field, MD;  Location: [facility];  Service: Urology;  Laterality: N/A;  . UPPER GI ENDOSCOPY  03/27/13    Family History  Problem Relation Age of Onset  . Diabetes Mother   . Hypertension Mother   . Stroke Mother   . Hyperlipidemia Mother   . Hypertension Father   . Colon polyps Father   . Heart attack Father 26  . Stroke Father   . Heart attack Brother   . Diabetes Brother   . Heart disease Brother   . Heart attack Brother        Multiple  . Diabetes Brother   . Diabetes Sister   . Stroke Sister   . Obesity Brother   . Diabetes Brother   . Heart disease Brother   . Hypertension Maternal Grandmother   . ADD / ADHD Daughter   . Heart disease Brother   .  Stomach cancer Neg Hx   . Colon cancer Neg Hx   . Esophageal cancer Neg Hx   . Rectal cancer Neg Hx     Social History   Socioeconomic History  . Marital status: Married    Spouse name: Not on file  . Number of children: 2  . Years of education: Not on file  . Highest education level: Master's degree (e.g., MA, MS, MEng, MEd, MSW, MBA)  Occupational History  . Occupation: TEACHER    Employer: [employer]  Tobacco Use  . Smoking status: Former    Current packs/day: 0.00    Average packs/day: 1.5 packs/day for 20.0 years (30.0 ttl pk-yrs)    Types: Cigarettes    Start date: 04/21/1971    Quit date: 04/21/1991    Years since quitting: 32.1  . Smokeless tobacco: Never  Vaping Use  . Vaping status: Never Used  Substance and Sexual Activity  . Alcohol use: Yes    Alcohol/week: 4.0 standard drinks of alcohol    Types: 4 Cans of beer per week    Comment: occas  . Drug use: No  . Sexual activity: Not Currently  Other Topics Concern  . Not on file  Social History Narrative   Lives with wife in [place].  Does  not routinely exercise.   Social Drivers of Health   Financial Resource Strain: Low Risk  (06/04/2023)   Overall Financial Resource Strain (CARDIA)   . Difficulty of Paying Living Expenses: Not hard at all  Food Insecurity: No Food Insecurity (06/04/2023)   Hunger Vital Sign   . Worried About Programme researcher, broadcasting/film/video in the Last Year: Never true   . Ran Out of Food in the Last Year: Never true  Transportation Needs: No Transportation Needs (06/04/2023)   PRAPARE - Transportation   . Lack of Transportation (Medical): No   . Lack of Transportation (Non-Medical): No  Physical Activity: Inactive (06/04/2023)   Exercise Vital Sign   . Days of Exercise per Week: 0 days   . Minutes of Exercise per Session: 30 min  Stress: Stress Concern Present (06/04/2023)   [employer] of Occupational Health - Occupational Stress Questionnaire   . Feeling of Stress : To some extent  Social Connections: Socially Integrated (06/04/2023)   Social Connection and Isolation Panel [NHANES]   . Frequency of Communication with Friends and Family: More than three times a week   . Frequency of Social Gatherings with Friends and Family: Once a week   . Attends Religious Services: More than 4 times per year   . Active Member of Clubs or Organizations: Yes   . Attends Banker Meetings: More than 4 times per year   . Marital Status: Married  Catering manager Violence: Not At Risk (02/06/2022)   Humiliation, Afraid, Rape, and Kick questionnaire   . Fear of Current or Ex-Partner: No   . Emotionally Abused: No   . Physically Abused: No   . Sexually Abused: No    Outpatient Medications Prior to Visit  Medication Sig Dispense Refill  . acetaminophen (TYLENOL) 500 MG tablet Take 1,000 mg by mouth every 6 (six) hours as needed for moderate pain or headache.    [name] amitriptyline (ELAVIL) 50 MG tablet TAKE 1 TABLET AT BEDTIME 90 tablet 3  . amoxicillin (AMOXIL) 500 MG capsule Take 2,000 mg by mouth See admin  instructions. Take 2,000 mg prior to dental appointments    . aspirin EC 81 MG tablet Take  1 tablet (81 mg total) by mouth daily. Swallow whole. 30 tablet 12  . Cholecalciferol (VITAMIN D) 50 MCG (2000 UT) tablet Take 6,000 Units by mouth daily.    . Cyanocobalamin (VITAMIN B-12) 1000 MCG SUBL Place 1,000 mcg under the tongue 2 (two) times a week.    . docusate sodium (COLACE) 100 MG capsule Take 100 mg by mouth daily as needed for mild constipation.    [name] EPINEPHrine 0.3 mg/0.3 mL IJ SOAJ injection Inject 0.3 mg into the muscle as needed for anaphylaxis. INJECT 0.3 MLS (0.3 MG TOTAL) INTO THE MUSCLE ONCE FOR 1 DOSE AS DIRECTED 2 each 3  . escitalopram (LEXAPRO) 20 MG tablet Take 1 tablet (20 mg total) by mouth daily.    [name] ezetimibe (ZETIA) 10 MG tablet TAKE 1 TABLET DAILY 90 tablet 2  . famotidine (PEPCID) 40 MG tablet TAKE 1 TABLET AT BEDTIME AS NEEDED FOR HEARTBURN OR INDIGESTION 90 tablet 3  . fenofibrate 160 MG tablet TAKE 1 TABLET DAILY 90 tablet 0  . fluticasone (FLONASE) 50 MCG/ACT nasal spray Place 2 sprays into both nostrils daily. 48 g 1  . gabapentin (NEURONTIN) 300 MG capsule Take 300 mg by mouth 3 (three) times daily. Pt takes 3 tablets 900 mg in the morning, 2 tablets 600 mg in the afternoon, and 3 tablets 900 mg at night/    . hydrochlorothiazide (MICROZIDE) 12.5 MG capsule TAKE 1 CAPSULE DAILY 90 capsule 2  . isosorbide mononitrate (IMDUR) 30 MG 24 hr tablet Take 45 mg by mouth in the morning and at bedtime.    [name] losartan (COZAAR) 50 MG tablet TAKE 1 TABLET DAILY (KEEP UPCOMING APPOINTMENT IN MARCH 2023 WITH CARDIOLOGIST BEFORE ANYMORE REFILLS) 90 tablet 3  . Magnesium 100 MG CAPS Take 1 capsule (100 mg total) by mouth daily. 30 capsule 1  . methocarbamol (ROBAXIN) 500 MG tablet TAKE 1 TABLET FOUR TIMES A DAY 360 tablet 3  . metoprolol tartrate (LOPRESSOR) 100 MG tablet TAKE 1 TABLET TWICE A DAY 180 tablet 3  . mometasone (ELOCON) 0.1 % cream Apply 1 Application topically daily.  (Patient taking differently: Apply 1 Application topically daily as needed (irritation).) 90 g 1  . Multiple Vitamin (MULTIVITAMIN) tablet Take 1 tablet by mouth daily.    . nitroGLYCERIN (NITROSTAT) 0.4 MG SL tablet Place 1 tablet (0.4 mg total) under the tongue every 5 (five) minutes as needed. 25 tablet 3  . NON FORMULARY Pt uses a cpap nightly    . oxycodone (OXY-IR) 5 MG capsule Take 5 mg by mouth in the morning, at noon, in the evening, and at bedtime.    . pantoprazole (PROTONIX) 40 MG tablet Take 1 tablet (40 mg total) by mouth 2 (two) times daily. Needs appt 180 tablet 0  . Pitavastatin Calcium 2 MG TABS TAKE 1 TABLET DAILY 90 tablet 3  . Semaglutide, 1 MG/DOSE, 4 MG/3ML SOPN Inject 1 mg as directed once a week.    . methylPREDNISolone (MEDROL DOSEPAK) 4 MG TBPK tablet 5 tabs po x 1 day then 4 tabs po x 1 day then 3 tabs po x 1 day then 2 tabs po x 1 day then 1 tab po x 1 day and stop 15 tablet 0  . Semaglutide,0.25 or 0.5MG /DOS, (OZEMPIC, 0.25 OR 0.5 MG/DOSE,) 2 MG/1.5ML SOPN Inject into the skin.    . Ferrous Gluconate-C-Folic Acid (IRON-C PO) Take 1 tablet by mouth daily. Iron 65 mg and vit C-125 mg    . Boris Lown  Oil 1000 MG CAPS Take 1,000 mg by mouth daily.     No facility-administered medications prior to visit.    Allergies  Allergen Reactions  . Bee Venom Anaphylaxis  . Lipitor [Atorvastatin] Other (See Comments)    Myalgias, memory changes  . Morphine Other (See Comments)    hyperactive  . Metformin And Related Other (See Comments)    Myalgias and weakness  . Lioresal [Baclofen] Other (See Comments)    Acted drunk  . Medrol [Methylprednisolone] Other (See Comments)    Hyperglycemia   . Zocor [Simvastatin] Other (See Comments)    Joint pain  . Hydrocodone Itching  . Latex Rash  . Pravachol [Pravastatin] Other (See Comments)    Joint pain    Review of Systems  Constitutional:  Negative for fever and malaise/fatigue.  HENT:  Negative for congestion.   Eyes:   Negative for blurred vision.  Respiratory:  Negative for shortness of breath.   Cardiovascular:  Negative for chest pain, palpitations and leg swelling.  Gastrointestinal:  Negative for abdominal pain, blood in stool and nausea.  Genitourinary:  Negative for dysuria and frequency.  Musculoskeletal:  Positive for back pain and joint pain. Negative for falls.  Skin:  Negative for rash.  Neurological:  Negative for dizziness, loss of consciousness and headaches.  Endo/Heme/Allergies:  Negative for environmental allergies.  Psychiatric/Behavioral:  Positive for depression. The patient is nervous/anxious.       Objective:    Physical Exam Vitals reviewed.  Constitutional:      Appearance: Normal appearance. He is not ill-appearing.  HENT:     Head: Normocephalic and atraumatic.     Nose: Nose normal.  Eyes:     Conjunctiva/sclera: Conjunctivae normal.  Cardiovascular:     Rate and Rhythm: Normal rate.     Pulses: Normal pulses.     Heart sounds: Normal heart sounds. No murmur heard. Pulmonary:     Effort: Pulmonary effort is normal.     Breath sounds: Normal breath sounds. No wheezing.  Abdominal:     Palpations: Abdomen is soft. There is no mass.     Tenderness: There is no abdominal tenderness.  Musculoskeletal:     Cervical back: Normal range of motion.     Right lower leg: No edema.     Left lower leg: No edema.  Skin:    General: Skin is warm and dry.  Neurological:     General: No focal deficit present.     Mental Status: He is alert and oriented to person, place, and time.  Psychiatric:        Mood and Affect: Mood normal.   BP 130/74 (BP Location: Left Arm, Patient Position: Sitting, Cuff Size: Large)   Pulse 74   Temp 97.8 F (36.6 C) (Oral)   Resp 18   Ht 5\' 8"  (1.727 m)   Wt 249 lb 6.4 oz (113.1 kg)   SpO2 94%   BMI 37.92 kg/m  Wt Readings from Last 3 Encounters:  06/07/23 249 lb 6.4 oz (113.1 kg)  03/08/23 243 lb 9.6 oz (110.5 kg)  03/03/23 242 lb  (109.8 kg)    Diabetic Foot Exam - Simple   No data filed    Lab Results  Component Value Date   WBC 4.3 03/03/2023   HGB 11.8 (L) 03/03/2023   HCT 36.2 (L) 03/03/2023   PLT 196 03/03/2023   GLUCOSE 152 (H) 10/02/2022   CHOL 102 03/23/2022   TRIG 102 03/23/2022   HDL  44 03/23/2022   LDLDIRECT 74.0 05/22/2021   LDLCALC 39 03/23/2022   ALT 16 03/23/2022   AST 29 03/23/2022   NA 137 10/02/2022   K 4.4 10/02/2022   CL 104 10/02/2022   CREATININE 1.28 (H) 10/02/2022   BUN 25 (H) 10/02/2022   CO2 24 10/02/2022   TSH 1.340 11/24/2021   PSA 1.55 06/25/2017   INR 1.1 02/04/2022   HGBA1C 5.8 (H) 08/28/2022   MICROALBUR 1.0 06/09/2022    Lab Results  Component Value Date   TSH 1.340 11/24/2021   Lab Results  Component Value Date   WBC 4.3 03/03/2023   HGB 11.8 (L) 03/03/2023   HCT 36.2 (L) 03/03/2023   MCV 91.0 03/03/2023   PLT 196 03/03/2023   Lab Results  Component Value Date   NA 137 10/02/2022   K 4.4 10/02/2022   CO2 24 10/02/2022   GLUCOSE 152 (H) 10/02/2022   BUN 25 (H) 10/02/2022   CREATININE 1.28 (H) 10/02/2022   BILITOT 0.3 03/23/2022   ALKPHOS 39 (L) 03/23/2022   AST 29 03/23/2022   ALT 16 03/23/2022   PROT 6.6 03/23/2022   ALBUMIN 4.8 03/23/2022   CALCIUM 9.0 10/02/2022   ANIONGAP 9 10/02/2022   EGFR 64 12/08/2021   GFR 57.71 (L) 06/09/2022   Lab Results  Component Value Date   CHOL 102 03/23/2022   Lab Results  Component Value Date   HDL 44 03/23/2022   Lab Results  Component Value Date   LDLCALC 39 03/23/2022   Lab Results  Component Value Date   TRIG 102 03/23/2022   Lab Results  Component Value Date   CHOLHDL 2.3 03/23/2022   Lab Results  Component Value Date   HGBA1C 5.8 (H) 08/28/2022       Assessment & Plan:  Essential hypertension Assessment & Plan: Well controlled, no changes to meds. Encouraged heart healthy diet such as the DASH diet and exercise as tolerated.   Orders: -     Comprehensive metabolic panel;  Future -     TSH; Future  Gastroesophageal reflux disease without esophagitis Assessment & Plan: Avoid offending foods, start probiotics. Do not eat large meals in late evening and consider raising head of bed.     Chronic gout without tophus, unspecified cause, unspecified site Assessment & Plan: hydrate and monitor   Orders: -     Uric acid; Future  History of Roux-en-Y gastric bypass Assessment & Plan: Supplement and monitor   Orders: -     Zinc; Future -     Magnesium; Future  Type 2 diabetes mellitus with hyperosmolarity without coma, without long-term current use of insulin (HCC) Assessment & Plan: hgba1c acceptable, minimize simple carbs. Increase exercise as tolerated. Continue current meds  Orders: -     Hemoglobin A1c; Future -     Microalbumin / creatinine urine ratio; Future  Vitamin B12 deficiency Assessment & Plan: Supplement and monitor  Orders: -     Vitamin B12; Future  Vitamin D deficiency Assessment & Plan: Supplement and monitor   Orders: -     VITAMIN D 25 Hydroxy (Vit-D Deficiency, Fractures); Future  Malabsorption of iron Assessment & Plan: Supplement and monitor    Hyperlipidemia, mixed -     Lipid panel; Future    Assessment and Plan    Post-Traumatic Stress Disorder (PTSD) Recently diagnosed by the [place]. Currently undergoing evaluation for disability status. Patient is on Escitalopram 20mg  daily, recently increased from 10mg . No concerning side effects  reported, except for a possible weight gain of 5-6 pounds. -Continue Escitalopram 20mg  daily. -Consider offering documentation or interpretation to assist with [facility] disability evaluation if needed.  Type 2 Diabetes Mellitus Last A1C in October 2021 was 6.1. Patient is on Ozempic, dose 1 mg weekly. Reports possible appetite suppression. -Check A1C in the next week to assess current control.  General Health Maintenance -Order comprehensive labs including cholesterol, metabolic  panel, J1B, thyroid, and vitamins D, B12, and zinc. -Check urine for protein due to history of diabetes. -Plan for physical exam in the fall. -Obtain recent eye exam report from Dr. Julian Reil in [place].         Danise Edge, MD

## 2023-06-11 ENCOUNTER — Other Ambulatory Visit: Payer: Medicare Other

## 2023-06-14 ENCOUNTER — Ambulatory Visit: Payer: Medicare Other | Admitting: Nurse Practitioner

## 2023-06-15 ENCOUNTER — Ambulatory Visit: Payer: Medicare Other | Admitting: Family Medicine

## 2023-06-18 ENCOUNTER — Other Ambulatory Visit (INDEPENDENT_AMBULATORY_CARE_PROVIDER_SITE_OTHER): Payer: Medicare Other

## 2023-06-18 DIAGNOSIS — E559 Vitamin D deficiency, unspecified: Secondary | ICD-10-CM

## 2023-06-18 DIAGNOSIS — E1100 Type 2 diabetes mellitus with hyperosmolarity without nonketotic hyperglycemic-hyperosmolar coma (NKHHC): Secondary | ICD-10-CM

## 2023-06-18 DIAGNOSIS — E782 Mixed hyperlipidemia: Secondary | ICD-10-CM | POA: Diagnosis not present

## 2023-06-18 DIAGNOSIS — M1A9XX0 Chronic gout, unspecified, without tophus (tophi): Secondary | ICD-10-CM | POA: Diagnosis not present

## 2023-06-18 DIAGNOSIS — I10 Essential (primary) hypertension: Secondary | ICD-10-CM | POA: Diagnosis not present

## 2023-06-18 DIAGNOSIS — Z9884 Bariatric surgery status: Secondary | ICD-10-CM | POA: Diagnosis not present

## 2023-06-18 DIAGNOSIS — E538 Deficiency of other specified B group vitamins: Secondary | ICD-10-CM | POA: Diagnosis not present

## 2023-06-18 DIAGNOSIS — M542 Cervicalgia: Secondary | ICD-10-CM | POA: Diagnosis not present

## 2023-06-18 DIAGNOSIS — M25511 Pain in right shoulder: Secondary | ICD-10-CM | POA: Diagnosis not present

## 2023-06-18 LAB — VITAMIN B12: Vitamin B-12: 909 pg/mL (ref 211–911)

## 2023-06-18 LAB — COMPREHENSIVE METABOLIC PANEL
ALT: 9 U/L (ref 0–53)
AST: 17 U/L (ref 0–37)
Albumin: 4.7 g/dL (ref 3.5–5.2)
Alkaline Phosphatase: 31 U/L — ABNORMAL LOW (ref 39–117)
BUN: 21 mg/dL (ref 6–23)
CO2: 27 meq/L (ref 19–32)
Calcium: 9.3 mg/dL (ref 8.4–10.5)
Chloride: 102 meq/L (ref 96–112)
Creatinine, Ser: 1.41 mg/dL (ref 0.40–1.50)
GFR: 51.5 mL/min — ABNORMAL LOW (ref >60.00)
Glucose, Bld: 166 mg/dL — ABNORMAL HIGH (ref 70–99)
Potassium: 4.6 meq/L (ref 3.5–5.1)
Sodium: 140 meq/L (ref 135–145)
Total Bilirubin: 0.6 mg/dL (ref 0.2–1.2)
Total Protein: 6.6 g/dL (ref 6.0–8.3)

## 2023-06-18 LAB — LIPID PANEL
Cholesterol: 119 mg/dL (ref 0–200)
HDL: 46.5 mg/dL (ref >39.00)
LDL Cholesterol: 53 mg/dL (ref 0–99)
NonHDL: 72.78
Total CHOL/HDL Ratio: 3
Triglycerides: 100 mg/dL (ref 0.0–149.0)
VLDL: 20 mg/dL (ref 0.0–40.0)

## 2023-06-18 LAB — VITAMIN D 25 HYDROXY (VIT D DEFICIENCY, FRACTURES): VITD: 27.25 ng/mL — ABNORMAL LOW (ref 30.00–100.00)

## 2023-06-18 LAB — MAGNESIUM: Magnesium: 1.5 mg/dL (ref 1.5–2.5)

## 2023-06-18 LAB — URIC ACID: Uric Acid, Serum: 7.6 mg/dL (ref 4.0–7.8)

## 2023-06-18 LAB — TSH: TSH: 2.08 u[IU]/mL (ref 0.35–5.50)

## 2023-06-18 LAB — HEMOGLOBIN A1C: Hgb A1c MFr Bld: 6.9 % — ABNORMAL HIGH (ref 4.6–6.5)

## 2023-06-20 ENCOUNTER — Encounter: Payer: Self-pay | Admitting: Family Medicine

## 2023-06-21 ENCOUNTER — Ambulatory Visit: Payer: Medicare Other | Admitting: Nurse Practitioner

## 2023-06-21 ENCOUNTER — Encounter: Payer: Self-pay | Admitting: Nurse Practitioner

## 2023-06-21 VITALS — BP 108/70 | HR 72 | Ht 68.0 in | Wt 254.0 lb

## 2023-06-21 DIAGNOSIS — G4733 Obstructive sleep apnea (adult) (pediatric): Secondary | ICD-10-CM | POA: Diagnosis not present

## 2023-06-21 NOTE — Progress Notes (Unsigned)
 @Patient  ID: Brent [name], male    DOB: [birth year], 68 y.o.   MRN: [id number]  Chief Complaint  Patient presents with   Sleep Consult    He is using CPAP nightly. Doing well overall.     Referring provider: Bradd Canary, MD  HPI: 68 year old male, former smoker followed for OSA on CPAP.  He was last seen by Dr. Wynona Neat in 2021.  Past medical history significant for CAD, hypertension, GERD, DM 2, gout, HLD, IDA, obesity, insomnia.  TEST/EVENTS:  03/27/2020 HST: AHI 5.1/h, SpO2 low 88%  02/22/2020: OV with Dr. Wynona Neat.  OSA on CPAP.  Machine has become dysfunctional.  It is greater than 71 years old.  He was previously on BiPAP but now on CPAP at 11 cm water.  Very compliant with therapy.  Lost about 90 pounds but gained about 20 pounds back.  Will need updated home sleep study.  If appropriate, will update CPAP machine.  06/21/2023: Today-overdue follow-up Patient presents today for overdue follow-up.  Has not been seen in a couple years.  He has been doing well on his CPAP.  Wears it nightly.  Feels well rested when using it.  Energy levels are good during the day.  Denies any issues with leaks or pressure settings.  No trouble with drowsy driving, morning headaches or sleep parasomnia/paralysis.  Wears a fullface mask.  05/22/2023-06/20/2023: CPAP 5 to 15 cm water 29/30 days; 93% greater than 4 hours; average use 8 hours 23 minutes Pressure 95th 11 Leaks 95th 5.7 AHI 2.7  Allergies  Allergen Reactions   Bee Venom Anaphylaxis   Lipitor [Atorvastatin] Other (See Comments)    Myalgias, memory changes   Morphine Other (See Comments)    hyperactive   Metformin And Related Other (See Comments)    Myalgias and weakness   Lioresal [Baclofen] Other (See Comments)    Acted drunk   Medrol [Methylprednisolone] Other (See Comments)    Hyperglycemia    Zocor [Simvastatin] Other (See Comments)    Joint pain   Hydrocodone Itching   Latex Rash   Pravachol [Pravastatin] Other (See  Comments)    Joint pain    Immunization History  Administered Date(s) Administered   Fluad Quad(high Dose 65+) 01/14/2021   Influenza Split 02/11/2011, 02/02/2012   Influenza Whole 01/18/2010, 03/04/2013   Influenza, High Dose Seasonal PF 02/11/2023   Influenza, Seasonal, Injecte, Preservative Fre 02/11/2011, 02/02/2012   Influenza,inj,Quad PF,6+ Mos 05/08/2014, 04/02/2015, 01/25/2018, 01/20/2019, 01/11/2020   Influenza,inj,quad, With Preservative 01/18/2017   Influenza-Unspecified 01/14/2022   Janssen (J&J) SARS-COV-2 Vaccination 07/01/2019, 03/18/2020, 03/19/2021   MMR 05/12/2022   PNEUMOCOCCAL CONJUGATE-20 01/14/2021   PPD Test 05/12/2022   Pneumococcal Conjugate-13 05/08/2014   Pneumococcal Polysaccharide-23 05/27/2015   Respiratory Syncytial Virus Vaccine,Recomb Aduvanted(Arexvy) 01/14/2022   Rsv, Bivalent, Protein Subunit Rsvpref,pf (Abrysvo) 01/14/2022   Td 04/20/1996, 10/29/2008   Td (Adult),5 Lf Tetanus Toxid, Preservative Free 01/14/2022   Tdap 03/01/2012   Zoster Recombinant(Shingrix) 08/12/2017, 10/12/2017   Zoster, Live 12/02/2015    Past Medical History:  Diagnosis Date   Acid reflux disease    ACID REFLUX DISEASE 07/05/2007   Acute gastric ulcer with bleeding    Anemia    Arthritis    Atherosclerosis    Benign neoplasm of colon 07/05/2007   Bilateral hip pain 10/05/2016   Bladder cancer (HCC)    Breast pain, left 11/17/2011   CAD (coronary artery disease)    Chest pain    a. Reportedly negative dobut echo performed  prior to gastric bypass in 03/2011;  b. 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 12/2011 Cath: LM nl, LAD 50p, D1 41m, LCX min irregs, OM3 30, RCA 25p, 80m (FFR 0.99->0.89), PDA 30, EF 65%, Med Rx. d. Cath 11/2021 - 90% m RCA s/p PCI/DES x1   COLONIC POLYPS, HX OF 10/29/2008   Diarrhea 06/13/2010   Diverticulosis 07/05/2018   DIVERTICULOSIS, COLON 10/29/2008   DM (diabetes mellitus), type 2, uncontrolled    GI bleed    Gout 03/04/2013   Hearing  loss 05/24/2013   Previous audiology evaluation completely.   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   Neck pain 03/2015   Other and unspecified hyperlipidemia 11/16/2012   Post-operative nausea and vomiting    after the surgery in hospital in March 2020/ past the cauderization   Preventative health care 11/17/2011   Raynaud's disease    Sleep apnea    a. CPAP   Spinal stenosis    Tear of meniscus of left knee 2012    Tobacco History: Social History   Tobacco Use  Smoking Status Former   Current packs/day: 0.00   Average packs/day: 1.5 packs/day for 20.0 years (30.0 ttl pk-yrs)   Types: Cigarettes   Start date: 04/21/1971   Quit date: 04/21/1991   Years since quitting: 32.1  Smokeless Tobacco Never   Counseling given: Not Answered   Outpatient Medications Prior to Visit  Medication Sig Dispense Refill   acetaminophen (TYLENOL) 500 MG tablet Take 1,000 mg by mouth every 6 (six) hours as needed for moderate pain or headache.     amitriptyline (ELAVIL) 50 MG tablet TAKE 1 TABLET AT BEDTIME 90 tablet 3   amoxicillin (AMOXIL) 500 MG capsule Take 2,000 mg by mouth See admin instructions. Take 2,000 mg prior to dental appointments     aspirin EC 81 MG tablet Take 1 tablet (81 mg total) by mouth daily. Swallow whole. 30 tablet 12   Cholecalciferol (VITAMIN D) 50 MCG (2000 UT) tablet Take 6,000 Units by mouth daily.     Cyanocobalamin (VITAMIN B-12) 1000 MCG SUBL Place 1,000 mcg under the tongue 2 (two) times a week.     docusate sodium (COLACE) 100 MG capsule Take 100 mg by mouth daily as needed for mild constipation.     EPINEPHrine 0.3 mg/0.3 mL IJ SOAJ injection Inject 0.3 mg into the muscle as  needed for anaphylaxis. INJECT 0.3 MLS (0.3 MG TOTAL) INTO THE MUSCLE ONCE FOR 1 DOSE AS DIRECTED 2 each 3   escitalopram (LEXAPRO) 20 MG tablet Take 1 tablet (20 mg total) by mouth daily.     ezetimibe (ZETIA) 10 MG tablet TAKE 1 TABLET DAILY 90 tablet 2   famotidine (PEPCID) 40 MG tablet TAKE 1 TABLET AT BEDTIME AS NEEDED FOR HEARTBURN OR INDIGESTION 90 tablet 3   fenofibrate 160 MG tablet TAKE 1 TABLET DAILY 90 tablet 0   fluticasone (FLONASE) 50 MCG/ACT nasal spray Place 2 sprays into both nostrils daily. 48 g 1   gabapentin (NEURONTIN) 300 MG capsule Take 300 mg by mouth 3 (three) times daily. Pt takes 3 tablets 900 mg in the morning, 2 tablets 600 mg in the  afternoon, and 3 tablets 900 mg at night/     hydrochlorothiazide (MICROZIDE) 12.5 MG capsule TAKE 1 CAPSULE DAILY 90 capsule 2   isosorbide mononitrate (IMDUR) 30 MG 24 hr tablet Take 45 mg by mouth in the morning and at bedtime.     losartan (COZAAR) 50 MG tablet TAKE 1 TABLET DAILY (KEEP UPCOMING APPOINTMENT IN MARCH 2023 WITH CARDIOLOGIST BEFORE ANYMORE REFILLS) 90 tablet 3   Magnesium 100 MG CAPS Take 1 capsule (100 mg total) by mouth daily. 30 capsule 1   methocarbamol (ROBAXIN) 500 MG tablet TAKE 1 TABLET FOUR TIMES A DAY 360 tablet 3   metoprolol tartrate (LOPRESSOR) 100 MG tablet TAKE 1 TABLET TWICE A DAY 180 tablet 3   mometasone (ELOCON) 0.1 % cream Apply 1 Application topically daily. (Patient taking differently: Apply 1 Application topically daily as needed (irritation).) 90 g 1   Multiple Vitamin (MULTIVITAMIN) tablet Take 1 tablet by mouth daily.     nitroGLYCERIN (NITROSTAT) 0.4 MG SL tablet Place 1 tablet (0.4 mg total) under the tongue every 5 (five) minutes as needed. 25 tablet 3   NON FORMULARY Pt uses a cpap nightly     oxycodone (OXY-IR) 5 MG capsule Take 5 mg by mouth in the morning, at noon, in the evening, and at bedtime.     pantoprazole (PROTONIX) 40 MG tablet Take 1 tablet (40 mg total) by mouth 2 (two) times  daily. Needs appt 180 tablet 0   Pitavastatin Calcium 2 MG TABS TAKE 1 TABLET DAILY 90 tablet 3   Semaglutide, 1 MG/DOSE, 4 MG/3ML SOPN Inject 1 mg as directed once a week.     No facility-administered medications prior to visit.     Review of Systems:   Constitutional: No weight loss or gain, night sweats, fevers, chills, fatigue, or lassitude. HEENT: No headaches, difficulty swallowing, tooth/dental problems, or sore throat. No sneezing, itching, ear ache, nasal congestion, or post nasal drip CV:  No chest pain, orthopnea, PND, swelling in lower extremities, anasarca, dizziness, palpitations, syncope Resp: No shortness of breath with exertion or at rest. No excess mucus or change in color of mucus. No productive or non-productive. No hemoptysis. No wheezing.   GI:  No heartburn, indigestion GU: No nocturia  Neuro: No memory impairment  Psych: No depression or anxiety. Mood stable.     Physical Exam:  BP 108/70 (BP Location: Right Arm, Cuff Size: Large)   Pulse 72   Ht 5\' 8"  (1.727 m)   Wt 254 lb (115.2 kg)   SpO2 98%   BMI 38.62 kg/m   GEN: Pleasant, interactive, well-appearing; obese; in no acute distress HEENT:  Normocephalic and atraumatic. PERRLA. Sclera white. Nasal turbinates pink, moist and patent bilaterally. No rhinorrhea present. Oropharynx pink and moist, without exudate or edema. No lesions, ulcerations, or postnasal drip. Mallampati II/III NECK:  Supple w/ fair ROM. No lymphadenopathy.   CV: RRR, no m/r/g, no peripheral edema. Pulses intact, +2 bilaterally. No cyanosis, pallor or clubbing. PULMONARY:  Unlabored, regular breathing. Clear bilaterally A&P w/o wheezes/rales/rhonchi. No accessory muscle use.  GI: BS present and normoactive. Soft, non-tender to palpation. No organomegaly or masses detected. MSK: No erythema, warmth or tenderness. Cap refil <2 sec all extrem.  Neuro: A/Ox3. No focal deficits noted.   Skin: Warm, no lesions or rashe Psych: Normal affect  and behavior. Judgement and thought content appropriate.     Lab Results:  CBC    Component Value Date/Time   WBC 4.3 03/03/2023 1406  WBC 5.6 10/02/2022 1404   RBC 3.98 (L) 03/03/2023 1406   HGB 11.8 (L) 03/03/2023 1406   HGB 13.8 11/24/2021 1125   HCT 36.2 (L) 03/03/2023 1406   HCT 41.3 11/24/2021 1125   PLT 196 03/03/2023 1406   PLT 242 11/24/2021 1125   MCV 91.0 03/03/2023 1406   MCV 89 11/24/2021 1125   MCH 29.6 03/03/2023 1406   MCHC 32.6 03/03/2023 1406   RDW 13.0 03/03/2023 1406   RDW 12.6 11/24/2021 1125   LYMPHSABS 1.3 03/03/2023 1406   MONOABS 0.4 03/03/2023 1406   EOSABS 0.1 03/03/2023 1406   BASOSABS 0.0 03/03/2023 1406    BMET    Component Value Date/Time   NA 140 06/18/2023 0839   NA 139 12/08/2021 1121   K 4.6 06/18/2023 0839   CL 102 06/18/2023 0839   CO2 27 06/18/2023 0839   GLUCOSE 166 (H) 06/18/2023 0839   BUN 21 06/18/2023 0839   BUN 21 12/08/2021 1121   CREATININE 1.41 06/18/2023 0839   CREATININE 1.19 11/18/2021 1131   CREATININE 1.19 01/11/2020 1543   CALCIUM 9.3 06/18/2023 0839   GFRNONAA >60 10/02/2022 1404   GFRNONAA >60 11/18/2021 1131   GFRAA 89 07/18/2019 1454   GFRAA >60 07/14/2018 1021    BNP No results found for: "BNP"   Imaging:  No results found.  Administration History     None           No data to display          No results found for: "NITRICOXIDE"      Assessment & Plan:   Obstructive sleep apnea on CPAP Mild OSA on CPAP. Excellent compliance and control. Receives benefit from use. Aware of risks of untreated OSA and potential treatment options. Needs updated supplies order - placed today. Aware of proper care/use of device. Healthy weight loss encouraged. Safe driving practices reviewed.   Patient Instructions  Continue to use CPAP every night, minimum of 4-6 hours a night.  Change equipment as directed. Wash your tubing with warm soap and water daily, hang to dry. Wash humidifier portion  weekly. Use bottled, distilled water and change daily Be aware of reduced alertness and do not drive or operate heavy machinery if experiencing this or drowsiness.  Exercise encouraged, as tolerated. Healthy weight management discussed.  Avoid or decrease alcohol consumption and medications that make you more sleepy, if possible. Notify if persistent daytime sleepiness occurs even with consistent use of PAP therapy.  We discussed how untreated sleep apnea puts an individual at risk for cardiac arrhthymias, pulm HTN, DM, stroke and increases their risk for daytime accidents.   Orders for new supplies  Follow up in one year with Dr. Wynona Neat or Philis Nettle, or sooner, if needed     Advised if symptoms do not improve or worsen, to please contact office for sooner follow up or seek emergency care.   I spent 25 minutes of dedicated to the care of this patient on the date of this encounter to include pre-visit review of records, face-to-face time with the patient discussing conditions above, post visit ordering of testing, clinical documentation with the electronic health record, making appropriate referrals as documented, and communicating necessary findings to members of the patients care team.  Noemi Chapel, NP 06/23/2023  Pt aware and understands NP's role.

## 2023-06-21 NOTE — Patient Instructions (Addendum)
 Continue to use CPAP every night, minimum of 4-6 hours a night.  Change equipment as directed. Wash your tubing with warm soap and water daily, hang to dry. Wash humidifier portion weekly. Use bottled, distilled water and change daily Be aware of reduced alertness and do not drive or operate heavy machinery if experiencing this or drowsiness.  Exercise encouraged, as tolerated. Healthy weight management discussed.  Avoid or decrease alcohol consumption and medications that make you more sleepy, if possible. Notify if persistent daytime sleepiness occurs even with consistent use of PAP therapy.  We discussed how untreated sleep apnea puts an individual at risk for cardiac arrhthymias, pulm HTN, DM, stroke and increases their risk for daytime accidents.   Orders for new supplies  Follow up in one year with Dr. Wynona Neat or Philis Nettle, or sooner, if needed

## 2023-06-23 ENCOUNTER — Other Ambulatory Visit: Payer: Self-pay | Admitting: Family Medicine

## 2023-06-23 ENCOUNTER — Encounter: Payer: Self-pay | Admitting: Nurse Practitioner

## 2023-06-23 LAB — ZINC: Zinc: 68 ug/dL (ref 60–130)

## 2023-06-23 NOTE — Assessment & Plan Note (Signed)
 Mild OSA on CPAP. Excellent compliance and control. Receives benefit from use. Aware of risks of untreated OSA and potential treatment options. Needs updated supplies order - placed today. Aware of proper care/use of device. Healthy weight loss encouraged. Safe driving practices reviewed.   Patient Instructions  Continue to use CPAP every night, minimum of 4-6 hours a night.  Change equipment as directed. Wash your tubing with warm soap and water daily, hang to dry. Wash humidifier portion weekly. Use bottled, distilled water and change daily Be aware of reduced alertness and do not drive or operate heavy machinery if experiencing this or drowsiness.  Exercise encouraged, as tolerated. Healthy weight management discussed.  Avoid or decrease alcohol consumption and medications that make you more sleepy, if possible. Notify if persistent daytime sleepiness occurs even with consistent use of PAP therapy.  We discussed how untreated sleep apnea puts an individual at risk for cardiac arrhthymias, pulm HTN, DM, stroke and increases their risk for daytime accidents.   Orders for new supplies  Follow up in one year with Dr. Wynona Neat or Philis Nettle, or sooner, if needed

## 2023-07-01 ENCOUNTER — Ambulatory Visit: Payer: Medicare Other | Admitting: Medical Oncology

## 2023-07-01 ENCOUNTER — Inpatient Hospital Stay: Payer: Medicare Other

## 2023-07-07 ENCOUNTER — Inpatient Hospital Stay

## 2023-07-07 ENCOUNTER — Inpatient Hospital Stay: Admitting: Medical Oncology

## 2023-07-08 ENCOUNTER — Ambulatory Visit: Admitting: Medical Oncology

## 2023-07-08 ENCOUNTER — Inpatient Hospital Stay

## 2023-07-13 ENCOUNTER — Other Ambulatory Visit: Payer: Self-pay

## 2023-07-13 ENCOUNTER — Inpatient Hospital Stay: Attending: Hematology & Oncology

## 2023-07-13 ENCOUNTER — Encounter: Payer: Self-pay | Admitting: Medical Oncology

## 2023-07-13 ENCOUNTER — Inpatient Hospital Stay (HOSPITAL_BASED_OUTPATIENT_CLINIC_OR_DEPARTMENT_OTHER): Admitting: Medical Oncology

## 2023-07-13 VITALS — BP 101/50 | HR 63 | Temp 98.0°F | Resp 19 | Ht 68.0 in | Wt 249.8 lb

## 2023-07-13 DIAGNOSIS — Z9884 Bariatric surgery status: Secondary | ICD-10-CM | POA: Diagnosis not present

## 2023-07-13 DIAGNOSIS — C679 Malignant neoplasm of bladder, unspecified: Secondary | ICD-10-CM | POA: Insufficient documentation

## 2023-07-13 DIAGNOSIS — K909 Intestinal malabsorption, unspecified: Secondary | ICD-10-CM | POA: Diagnosis not present

## 2023-07-13 DIAGNOSIS — D508 Other iron deficiency anemias: Secondary | ICD-10-CM | POA: Diagnosis not present

## 2023-07-13 DIAGNOSIS — D500 Iron deficiency anemia secondary to blood loss (chronic): Secondary | ICD-10-CM

## 2023-07-13 LAB — CBC
HCT: 35.2 % — ABNORMAL LOW (ref 39.0–52.0)
Hemoglobin: 11.5 g/dL — ABNORMAL LOW (ref 13.0–17.0)
MCH: 30.1 pg (ref 26.0–34.0)
MCHC: 32.7 g/dL (ref 30.0–36.0)
MCV: 92.1 fL (ref 80.0–100.0)
Platelets: 181 10*3/uL (ref 150–400)
RBC: 3.82 MIL/uL — ABNORMAL LOW (ref 4.22–5.81)
RDW: 13.2 % (ref 11.5–15.5)
WBC: 5.5 10*3/uL (ref 4.0–10.5)
nRBC: 0 % (ref 0.0–0.2)

## 2023-07-13 LAB — IRON AND IRON BINDING CAPACITY (CC-WL,HP ONLY)
Iron: 69 ug/dL (ref 45–182)
Saturation Ratios: 17 % — ABNORMAL LOW (ref 17.9–39.5)
TIBC: 417 ug/dL (ref 250–450)
UIBC: 348 ug/dL (ref 117–376)

## 2023-07-13 LAB — FERRITIN: Ferritin: 94 ng/mL (ref 24–336)

## 2023-07-13 NOTE — Progress Notes (Signed)
 Hematology and Oncology Follow Up Visit  Brent [name] [id number] [birth year] 68 y.o. 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   Today he states that he has been doing well. He has no concerns today.   He continues to be followed by Urology for his bladder cancer- his next follow up is in April.  No blood loss noted. No abnormal bruising, no petechiae.  No fever, chills, n/v, cough, rash, dizziness, SOB, chest pain, palpitations, abdominal pain or changes in bowel or bladder habits.  No swelling or tenderness in his extremities.  No falls or syncope.  Appetite and hydration are good.  Wt Readings from Last 3 Encounters:  07/13/23 249 lb 12.8 oz (113.3 kg)  06/21/23 254 lb (115.2 kg)  06/07/23 249 lb 6.4 oz (113.1 kg)   ECOG Performance Status: 1 - Symptomatic but completely ambulatory  Medications:  Allergies as of 07/13/2023       Reactions   Bee Venom Anaphylaxis   Lipitor [atorvastatin] Other (See Comments)   Myalgias, memory changes   Morphine Other (See Comments)   hyperactive   Metformin And Related Other (See Comments)   Myalgias and weakness   Lioresal [baclofen] Other (See Comments)   Acted drunk   Medrol [methylprednisolone] Other (See Comments)   Hyperglycemia    Zocor [simvastatin] Other (See Comments)   Joint pain   Hydrocodone Itching   Latex Rash   Pravachol [pravastatin] Other (See Comments)   Joint pain        Medication List        Accurate as of July 13, 2023  1:43 PM. If you have any questions, ask your nurse or doctor.          acetaminophen 500 MG tablet Commonly known as: TYLENOL Take 1,000 mg by mouth every 6 (six)  hours as needed for moderate pain or headache.   amitriptyline 50 MG tablet Commonly known as: ELAVIL Take 1 tablet (50 mg total) by mouth at bedtime.   amoxicillin 500 MG capsule Commonly known as: AMOXIL Take 2,000 mg by mouth See admin instructions. Take 2,000 mg prior to dental appointments   aspirin EC 81 MG tablet Take 1 tablet (81 mg total) by mouth daily. Swallow whole.   docusate sodium 100 MG capsule Commonly known as: COLACE Take 100 mg by mouth daily as needed for mild constipation.   EPINEPHrine 0.3 mg/0.3 mL Soaj injection Commonly known as: EPI-PEN Inject 0.3 mg into the muscle as needed for anaphylaxis. INJECT 0.3 MLS (0.3 MG TOTAL) INTO THE MUSCLE ONCE FOR 1 DOSE AS DIRECTED   ezetimibe 10 MG tablet Commonly known as: ZETIA TAKE 1 TABLET DAILY   famotidine 40 MG tablet Commonly known as: PEPCID TAKE 1 TABLET AT BEDTIME AS NEEDED FOR HEARTBURN OR INDIGESTION   fenofibrate 160 MG tablet TAKE 1 TABLET DAILY   fluticasone 50 MCG/ACT nasal spray Commonly known as: FLONASE Place 2 sprays into both nostrils daily.   gabapentin 300 MG capsule Commonly known as: NEURONTIN Take 300 mg by mouth 3 (three) times daily. Pt takes 3 tablets 900 mg in the morning, 2 tablets 600 mg in the afternoon, and 3 tablets 900  mg at night/   hydrochlorothiazide 12.5 MG capsule Commonly known as: MICROZIDE TAKE 1 CAPSULE DAILY   isosorbide mononitrate 30 MG 24 hr tablet Commonly known as: IMDUR Take 45 mg by mouth in the morning and at bedtime.   Lexapro 20 MG tablet Generic drug: escitalopram Take 1 tablet (20 mg total) by mouth daily.   losartan 50 MG tablet Commonly known as: COZAAR TAKE 1 TABLET DAILY (KEEP UPCOMING APPOINTMENT IN MARCH 2023 WITH CARDIOLOGIST BEFORE ANYMORE REFILLS)   Magnesium 100 MG Caps Take 1 capsule (100 mg total) by mouth daily.   methocarbamol 500 MG tablet Commonly known as: ROBAXIN TAKE 1 TABLET FOUR TIMES A DAY   metoprolol tartrate  100 MG tablet Commonly known as: LOPRESSOR TAKE 1 TABLET TWICE A DAY   mometasone 0.1 % cream Commonly known as: Elocon Apply 1 Application topically daily. What changed:  when to take this reasons to take this   multivitamin tablet Take 1 tablet by mouth daily.   nitroGLYCERIN 0.4 MG SL tablet Commonly known as: Nitrostat Place 1 tablet (0.4 mg total) under the tongue every 5 (five) minutes as needed.   NON FORMULARY Pt uses a cpap nightly   oxycodone 5 MG capsule Commonly known as: OXY-IR Take 5 mg by mouth in the morning, at noon, in the evening, and at bedtime.   pantoprazole 40 MG tablet Commonly known as: PROTONIX Take 1 tablet (40 mg total) by mouth 2 (two) times daily. Needs appt   Pitavastatin Calcium 2 MG Tabs TAKE 1 TABLET DAILY   Semaglutide (1 MG/DOSE) 4 MG/3ML Sopn Inject 1 mg as directed once a week.   Vitamin B-12 1000 MCG Subl Place 1,000 mcg under the tongue 2 (two) times a week.   Vitamin D 50 MCG (2000 UT) tablet Take 6,000 Units by mouth daily.        Allergies:  Allergies  Allergen Reactions   Bee Venom Anaphylaxis   Lipitor [Atorvastatin] Other (See Comments)    Myalgias, memory changes   Morphine Other (See Comments)    hyperactive   Metformin And Related Other (See Comments)    Myalgias and weakness   Lioresal [Baclofen] Other (See Comments)    Acted drunk   Medrol [Methylprednisolone] Other (See Comments)    Hyperglycemia    Zocor [Simvastatin] Other (See Comments)    Joint pain   Hydrocodone Itching   Latex Rash   Pravachol [Pravastatin] Other (See Comments)    Joint pain    Past Medical History, Surgical history, Social history, and Family History were reviewed and updated.  Review of Systems: All other 10 point review of systems is negative.   Physical Exam:  height is 5\' 8"  (1.727 m) and weight is 249 lb 12.8 oz (113.3 kg). His oral temperature is 98 F (36.7 C). His blood pressure is 101/50 (abnormal) and his  pulse is 63. His respiration is 19 and oxygen saturation is 100%.   Wt Readings from Last 3 Encounters:  07/13/23 249 lb 12.8 oz (113.3 kg)  06/21/23 254 lb (115.2 kg)  06/07/23 249 lb 6.4 oz (113.1 kg)   General: AAx3.  Ocular: Sclerae unicteric, pupils equal, round and reactive to light Ear-nose-throat: Oropharynx clear, dentition fair Lymphatic: No cervical or supraclavicular adenopathy Lungs no rales or rhonchi, good excursion bilaterally Heart regular rate and rhythm, no murmur appreciated Neuro: non-focal, well-oriented, appropriate affect  Lab Results  Component Value Date   WBC 5.5 07/13/2023   HGB 11.5 (L) 07/13/2023  HCT 35.2 (L) 07/13/2023   MCV 92.1 07/13/2023   PLT 181 07/13/2023   Lab Results  Component Value Date   FERRITIN 79 03/03/2023   IRON 65 03/03/2023   TIBC 337 03/03/2023   UIBC 272 03/03/2023   IRONPCTSAT 19 03/03/2023   Lab Results  Component Value Date   RETICCTPCT 1.5 05/04/2022   RBC 3.82 (L) 07/13/2023   No results found for: "KPAFRELGTCHN", "LAMBDASER", "KAPLAMBRATIO" No results found for: "IGGSERUM", "IGA", "IGMSERUM" No results found for: "TOTALPROTELP", "ALBUMINELP", "A1GS", "A2GS", "BETS", "BETA2SER", "GAMS", "MSPIKE", "SPEI"   Chemistry      Component Value Date/Time   NA 140 06/18/2023 0839   NA 139 12/08/2021 1121   K 4.6 06/18/2023 0839   CL 102 06/18/2023 0839   CO2 27 06/18/2023 0839   BUN 21 06/18/2023 0839   BUN 21 12/08/2021 1121   CREATININE 1.41 06/18/2023 0839   CREATININE 1.19 11/18/2021 1131   CREATININE 1.19 01/11/2020 1543      Component Value Date/Time   CALCIUM 9.3 06/18/2023 0839   ALKPHOS 31 (L) 06/18/2023 0839   AST 17 06/18/2023 0839   AST 21 11/18/2021 1131   ALT 9 06/18/2023 0839   ALT 15 11/18/2021 1131   BILITOT 0.6 06/18/2023 0839   BILITOT 0.3 03/23/2022 0733   BILITOT 0.7 11/18/2021 1131     Encounter Diagnoses  Name Primary?   Iron deficiency anemia due to chronic blood loss Yes    Malabsorption of iron    S/P gastric bypass     Impression and Plan: Mr. Pieczynski is a very pleasant 68 yo caucasian gentleman with iron deficiency anemia secondary to malabsorption (gastric bypass) as well intermittent GI blood loss with ulcer and bladder cancer.   Hgb is relatively stable at 11.5 (previous value was 11.8 on 03/03/2023) He will continue his GI and Urology follow ups Iron studies are pending. Will replace if needed  RTC 6 months APP, labs (CBC, iron, ferritin)   Rushie Chestnut, PA-C 3/25/20251:43 PM

## 2023-07-14 ENCOUNTER — Encounter: Payer: Self-pay | Admitting: Medical Oncology

## 2023-07-21 DIAGNOSIS — M48061 Spinal stenosis, lumbar region without neurogenic claudication: Secondary | ICD-10-CM | POA: Diagnosis not present

## 2023-07-21 DIAGNOSIS — M51361 Other intervertebral disc degeneration, lumbar region with lower extremity pain only: Secondary | ICD-10-CM | POA: Diagnosis not present

## 2023-07-21 DIAGNOSIS — Z79899 Other long term (current) drug therapy: Secondary | ICD-10-CM | POA: Diagnosis not present

## 2023-07-21 DIAGNOSIS — Z5181 Encounter for therapeutic drug level monitoring: Secondary | ICD-10-CM | POA: Diagnosis not present

## 2023-07-21 DIAGNOSIS — M545 Low back pain, unspecified: Secondary | ICD-10-CM | POA: Diagnosis not present

## 2023-07-21 DIAGNOSIS — M5030 Other cervical disc degeneration, unspecified cervical region: Secondary | ICD-10-CM | POA: Diagnosis not present

## 2023-07-21 DIAGNOSIS — M542 Cervicalgia: Secondary | ICD-10-CM | POA: Diagnosis not present

## 2023-07-21 DIAGNOSIS — M5412 Radiculopathy, cervical region: Secondary | ICD-10-CM | POA: Diagnosis not present

## 2023-07-28 ENCOUNTER — Other Ambulatory Visit: Payer: Self-pay | Admitting: Cardiology

## 2023-08-04 ENCOUNTER — Other Ambulatory Visit: Payer: Self-pay | Admitting: Family Medicine

## 2023-08-11 DIAGNOSIS — Z8551 Personal history of malignant neoplasm of bladder: Secondary | ICD-10-CM | POA: Diagnosis not present

## 2023-08-18 ENCOUNTER — Other Ambulatory Visit: Payer: Self-pay | Admitting: Family Medicine

## 2023-08-23 ENCOUNTER — Telehealth: Payer: Self-pay

## 2023-08-23 NOTE — Telephone Encounter (Signed)
 Copied from CRM [id number]. Topic: General - Other >> Aug 20, 2023  4:20 PM Alverda Joe S wrote: Reason for CRM: patient dme company [employer] needs his 06/21/23 doctor notes and a prescription for his cpap supplies.

## 2023-08-24 NOTE — Telephone Encounter (Signed)
 CMN received from [facility]

## 2023-08-30 ENCOUNTER — Other Ambulatory Visit: Payer: Self-pay | Admitting: Cardiology

## 2023-09-02 DIAGNOSIS — E1165 Type 2 diabetes mellitus with hyperglycemia: Secondary | ICD-10-CM | POA: Diagnosis not present

## 2023-09-02 LAB — PROTEIN / CREATININE RATIO, URINE
Albumin, U: <7
Creatinine, Urine: 32

## 2023-09-10 NOTE — Telephone Encounter (Signed)
 We did not receive it back from [facility].

## 2023-09-10 NOTE — Telephone Encounter (Signed)
 Do we know if this was faxed back? No CMN is in [facility]'s cabinet. If not faxed back will need [employer] to re-fax CMN

## 2023-09-20 DIAGNOSIS — E1139 Type 2 diabetes mellitus with other diabetic ophthalmic complication: Secondary | ICD-10-CM | POA: Diagnosis not present

## 2023-09-20 DIAGNOSIS — Z794 Long term (current) use of insulin: Secondary | ICD-10-CM | POA: Diagnosis not present

## 2023-09-20 DIAGNOSIS — Z7984 Long term (current) use of oral hypoglycemic drugs: Secondary | ICD-10-CM | POA: Diagnosis not present

## 2023-09-23 LAB — HM DIABETES EYE EXAM

## 2023-09-30 ENCOUNTER — Other Ambulatory Visit: Payer: Self-pay | Admitting: Cardiology

## 2023-09-30 DIAGNOSIS — R0989 Other specified symptoms and signs involving the circulatory and respiratory systems: Secondary | ICD-10-CM

## 2023-09-30 DIAGNOSIS — R0609 Other forms of dyspnea: Secondary | ICD-10-CM

## 2023-10-08 DIAGNOSIS — M1712 Unilateral primary osteoarthritis, left knee: Secondary | ICD-10-CM | POA: Diagnosis not present

## 2023-10-26 ENCOUNTER — Encounter: Payer: Self-pay | Admitting: Cardiology

## 2023-10-26 ENCOUNTER — Other Ambulatory Visit: Payer: Self-pay | Admitting: Family Medicine

## 2023-10-26 DIAGNOSIS — Z91030 Bee allergy status: Secondary | ICD-10-CM

## 2023-11-02 DIAGNOSIS — M1712 Unilateral primary osteoarthritis, left knee: Secondary | ICD-10-CM | POA: Diagnosis not present

## 2023-11-16 ENCOUNTER — Ambulatory Visit (INDEPENDENT_AMBULATORY_CARE_PROVIDER_SITE_OTHER)

## 2023-11-16 VITALS — Ht 68.0 in | Wt 241.0 lb

## 2023-11-16 DIAGNOSIS — Z0000 Encounter for general adult medical examination without abnormal findings: Secondary | ICD-10-CM | POA: Diagnosis not present

## 2023-11-16 NOTE — Progress Notes (Signed)
 Subjective:   Brent [name] AGE is a 68 y.o. who presents for a Medicare Wellness preventive visit.  As a reminder, Annual Wellness Visits don't include a physical exam, and some assessments may be limited, especially if this visit is performed virtually. We may recommend an in-person follow-up visit with your provider if needed.  Visit Complete: Virtual I connected with  [name] on 11/16/23 by a audio enabled telemedicine application and verified that I am speaking with the correct person using two identifiers.  Patient Location: Home  Provider Location: Home Office  I discussed the limitations of evaluation and management by telemedicine. The patient expressed understanding and agreed to proceed.  Vital Signs: Because this visit was a virtual/telehealth visit, some criteria may be missing or patient reported. Any vitals not documented were not able to be obtained and vitals that have been documented are patient reported.    Persons Participating in Visit: Patient.  AWV Questionnaire: No: Patient Medicare AWV questionnaire was not completed prior to this visit.  Cardiac Risk Factors include: advanced age (>44men, >59 women);male gender;diabetes mellitus;hypertension     Objective:    Today's Vitals   11/16/23 1112  Weight: 241 lb (109.3 kg)  Height: 5' 8 (1.727 m)   Body mass index is 36.64 kg/m.     11/16/2023   11:24 AM 07/13/2023    1:26 PM 03/03/2023    2:25 PM 10/06/2022    8:34 AM 10/02/2022    1:43 PM 08/31/2022    1:28 PM 08/28/2022   11:17 AM  Advanced Directives  Does Patient Have a Medical Advance Directive? Yes Yes Yes Yes Yes Yes Yes  Type of Estate agent of [place];Living will  Healthcare Power of [place];Living will Healthcare Power of [place];Living will Healthcare Power of [place];Living will Healthcare Power of [employer] Power of Attorney  Does patient want to make changes to medical advance directive?   No -  Patient declined No - Patient declined     Copy of Healthcare Power of Attorney in Chart? No - copy requested  No - copy requested No - copy requested  No - copy requested No - copy requested  Would patient like information on creating a medical advance directive?   No - Patient declined   No - Patient declined     Current Medications (verified) Outpatient Encounter Medications as of 11/16/2023  Medication Sig   acetaminophen  (TYLENOL ) 500 MG tablet Take 1,000 mg by mouth every 6 (six) hours as needed for moderate pain or headache.   amitriptyline  (ELAVIL ) 50 MG tablet Take 1 tablet (50 mg total) by mouth at bedtime.   amoxicillin  (AMOXIL ) 500 MG capsule Take 2,000 mg by mouth See admin instructions. Take 2,000 mg prior to dental appointments   aspirin  EC 81 MG tablet Take 1 tablet (81 mg total) by mouth daily. Swallow whole.   Cholecalciferol (VITAMIN D ) 50 MCG (2000 UT) tablet Take 6,000 Units by mouth daily.   Cyanocobalamin  (VITAMIN B-12) 1000 MCG SUBL Place 1,000 mcg under the tongue 2 (two) times a week.   docusate sodium  (COLACE) 100 MG capsule Take 100 mg by mouth daily as needed for mild constipation.   EPINEPHRINE  0.3 mg/0.3 mL IJ SOAJ injection INJECT 0.3 MG (0.3 ML) INTO THE MUSCLE ONCE FOR 1 DOSE AS NEEDED FOR ANAPHYLAXIS AS DIRECTED   escitalopram (LEXAPRO) 20 MG tablet Take 1 tablet (20 mg total) by mouth daily.   ezetimibe  (ZETIA ) 10 MG tablet TAKE 1 TABLET  DAILY   famotidine  (PEPCID ) 40 MG tablet TAKE 1 TABLET AT BEDTIME AS NEEDED FOR HEARTBURN OR INDIGESTION   fenofibrate  160 MG tablet TAKE 1 TABLET DAILY   fluticasone  (FLONASE ) 50 MCG/ACT nasal spray Place 2 sprays into both nostrils daily.   gabapentin  (NEURONTIN ) 300 MG capsule Take 300 mg by mouth 3 (three) times daily. Pt takes 3 tablets 900 mg in the morning, 2 tablets 600 mg in the afternoon, and 3 tablets 900 mg at night/   hydrochlorothiazide  (MICROZIDE ) 12.5 MG capsule TAKE 1 CAPSULE DAILY   isosorbide  mononitrate  (IMDUR ) 30 MG 24 hr tablet Take 45 mg by mouth in the morning and at bedtime.   losartan  (COZAAR ) 50 MG tablet Take 1 tablet (50 mg total) by mouth daily.   Magnesium  100 MG CAPS Take 1 capsule (100 mg total) by mouth daily.   methocarbamol  (ROBAXIN ) 500 MG tablet TAKE 1 TABLET FOUR TIMES A DAY   metoprolol  tartrate (LOPRESSOR ) 100 MG tablet TAKE 1 TABLET TWICE A DAY   mometasone  (ELOCON ) 0.1 % cream Apply 1 Application topically daily. (Patient taking differently: Apply 1 Application topically daily as needed (irritation).)   Multiple Vitamin (MULTIVITAMIN) tablet Take 1 tablet by mouth daily.   nitroGLYCERIN  (NITROSTAT ) 0.4 MG SL tablet Place 1 tablet (0.4 mg total) under the tongue every 5 (five) minutes as needed.   NON FORMULARY Pt uses a cpap nightly   oxycodone  (OXY-IR) 5 MG capsule Take 5 mg by mouth in the morning, at noon, in the evening, and at bedtime.   pantoprazole  (PROTONIX ) 40 MG tablet Take 1 tablet (40 mg total) by mouth 2 (two) times daily before a meal.   Pitavastatin  Calcium  2 MG TABS TAKE 1 TABLET DAILY   Semaglutide, 1 MG/DOSE, 4 MG/3ML SOPN Inject 1 mg as directed once a week.   No facility-administered encounter medications on file as of 11/16/2023.    Allergies (verified) Bee venom, Lipitor [atorvastatin ], Morphine , Metformin  and related, Lioresal  [baclofen ], Medrol  [methylprednisolone ], Zocor  [simvastatin ], Hydrocodone , Latex, and Pravachol  [pravastatin ]   History: Past Medical History:  Diagnosis Date   Acid reflux disease    ACID REFLUX DISEASE 07/05/2007   Acute gastric ulcer with bleeding    Anemia    Arthritis    Atherosclerosis    Benign neoplasm of colon 07/05/2007   Bilateral hip pain 10/05/2016   Bladder cancer (HCC)    Breast pain, left 11/17/2011   CAD (coronary artery disease)    Chest pain    a. Reportedly negative dobut echo performed prior to gastric bypass in 03/2011;  b. 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 12/2011 Cath: LM nl, LAD 50p, D1  6m, LCX min irregs, OM3 30, RCA 25p, 3m (FFR 0.99->0.89), PDA 30, EF 65%, Med Rx. d. Cath 11/2021 - 90% m RCA s/p PCI/DES x1   COLONIC POLYPS, HX OF 10/29/2008   Diarrhea 06/13/2010   Diverticulosis 07/05/2018   DIVERTICULOSIS, COLON 10/29/2008   DM (diabetes mellitus), type 2, uncontrolled    GI bleed    Gout 03/04/2013   Hearing loss 05/24/2013   Previous audiology evaluation completely.   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   Neck  pain 03/2015   Other and unspecified hyperlipidemia 11/16/2012   Post-operative nausea and vomiting    after the surgery in hospital in March 2020/ past the cauderization   Preventative health care 11/17/2011   Raynaud's disease    Sleep apnea    a. CPAP   Spinal stenosis    Tear of meniscus of left knee 2012   Past Surgical History:  Procedure Laterality Date   ANKLE SURGERY Right 1994   BICEPS TENDON REPAIR     left side   CARDIAC CATHETERIZATION     denies any chest pain in the past 2 years   COLONOSCOPY     colonoscopy polyps     CORONARY STENT INTERVENTION N/A 11/28/2021   Procedure: CORONARY STENT INTERVENTION;  Surgeon: Swaziland, Peter M, MD;  Location: [facility] INVASIVE CV LAB;  Service: Cardiovascular;  Laterality: N/A;   CYSTOSCOPY WITH BIOPSY Bilateral 10/06/2022   Procedure: CYSTOSCOPY WITH BIOPSY FULGURATION BILATERAL RETROGRADE PYELOGRAM INSTILL GEMCITABINE ;  Surgeon: Nieves Cough, MD;  Location: [facility];  Service: Urology;  Laterality: Bilateral;  1 HR FOR CASE   ESOPHAGOGASTRODUODENOSCOPY N/A 03/27/2013   Procedure: ESOPHAGOGASTRODUODENOSCOPY (EGD);  Surgeon: Norleen LOISE Kiang, MD;  Location: [facility];  Service: Endoscopy;  Laterality: N/A;    ESOPHAGOGASTRODUODENOSCOPY (EGD) WITH PROPOFOL  N/A 07/06/2018   Procedure: ESOPHAGOGASTRODUODENOSCOPY (EGD) WITH PROPOFOL ;  Surgeon: Albertus Gordy HERO, MD;  Location: [facility];  Service: Gastroenterology;  Laterality: N/A;   ESOPHAGOGASTRODUODENOSCOPY (EGD) WITH PROPOFOL  N/A 02/05/2022   Procedure: ESOPHAGOGASTRODUODENOSCOPY (EGD) WITH PROPOFOL ;  Surgeon: San Sandor GAILS, DO;  Location: [facility];  Service: Gastroenterology;  Laterality: N/A;   GASTRIC BYPASS     HEMOSTASIS CLIP PLACEMENT  02/05/2022   Procedure: HEMOSTASIS CLIP PLACEMENT;  Surgeon: San Sandor GAILS, DO;  Location: [facility];  Service: Gastroenterology;;   HERNIA REPAIR     HOT HEMOSTASIS N/A 07/06/2018   Procedure: HOT HEMOSTASIS (ARGON PLASMA COAGULATION/BICAP);  Surgeon: Albertus Gordy HERO, MD;  Location: [facility];  Service: Gastroenterology;  Laterality: N/A;   KNEE ARTHROSCOPY Left 11/06/2010   Left, torn meniscus (repaired)   LEFT HEART CATH AND CORONARY ANGIOGRAPHY N/A 11/28/2021   Procedure: LEFT HEART CATH AND CORONARY ANGIOGRAPHY;  Surgeon: Swaziland, Peter M, MD;  Location: [facility] INVASIVE CV LAB;  Service: Cardiovascular;  Laterality: N/A;   LEFT HEART CATHETERIZATION WITH CORONARY ANGIOGRAM N/A 12/23/2011   Procedure: LEFT HEART CATHETERIZATION WITH CORONARY ANGIOGRAM;  Surgeon: Lynwood Schilling, MD;  Location: [facility] CATH LAB;  Service: Cardiovascular;  Laterality: N/A;   REPLACEMENT TOTAL KNEE Right 01/2016   removed scar tissue/ cut tip of nerve bundle and re-route   right knee arthroscopy Right 07/05/14   Dr. Prentice Collet, [facility].   ROTATOR CUFF REPAIR  2019   left   SCHLEROTHERAPY  07/06/2018   Procedure: SCHLEROTHERAPY;  Surgeon: Albertus Gordy HERO, MD;  Location: [facility];  Service: Gastroenterology;;   MATIAS  02/05/2022   Procedure: MATIAS;  Surgeon: San Sandor GAILS, DO;  Location: [facility];  Service: Gastroenterology;;   TONSILLECTOMY  age 67   TONSILLECTOMY     as a child   TOTAL  KNEE ARTHROPLASTY Right 01/20/2016   Procedure: RIGHT TOTAL KNEE ARTHROPLASTY;  Surgeon: Cough Car, MD;  Location: [facility];  Service: Orthopedics;  Laterality: Right;   TRANSURETHRAL RESECTION OF BLADDER TUMOR N/A 09/24/2020   Procedure: TRANSURETHRAL RESECTION OF BLADDER TUMOR (TURBT)  RIGHT retrograde pylegram;  Surgeon: Nieves Cough, MD;  Location: [facility];  Service: Urology;  Laterality: N/A;  UPPER GI ENDOSCOPY  03/27/13   Family History  Problem Relation Age of Onset   Diabetes Mother    Hypertension Mother    Stroke Mother    Hyperlipidemia Mother    Hypertension Father    Colon polyps Father    Heart attack Father 35   Stroke Father    Heart attack Brother    Diabetes Brother    Heart disease Brother    Heart attack Brother        Multiple   Diabetes Brother    Diabetes Sister    Stroke Sister    Obesity Brother    Diabetes Brother    Heart disease Brother    Hypertension Maternal Grandmother    ADD / ADHD Daughter    Heart disease Brother    Stomach cancer Neg Hx    Colon cancer Neg Hx    Esophageal cancer Neg Hx    Rectal cancer Neg Hx    Social History   Socioeconomic History   Marital status: Married    Spouse name: Not on file   Number of children: 2   Years of education: Not on file   Highest education level: Master's degree (e.g., MA, MS, MEng, MEd, MSW, MBA)  Occupational History   Occupation: TEACHER    Employer: [employer]  Tobacco Use   Smoking status: Former    Current packs/day: 0.00    Average packs/day: 1.5 packs/day for 20.0 years (30.0 ttl pk-yrs)    Types: Cigarettes    Start date: 04/21/1971    Quit date: 04/21/1991    Years since quitting: 32.5   Smokeless tobacco: Never  Vaping Use   Vaping status: Never Used  Substance and Sexual Activity   Alcohol use: Yes    Alcohol/week: 4.0 standard drinks of alcohol    Types: 4 Cans of beer per week    Comment: occas   Drug use: No   Sexual activity: Not Currently  Other  Topics Concern   Not on file  Social History Narrative   Lives with wife in [place].  Does not routinely exercise.   Social Drivers of Corporate investment banker Strain: Low Risk  (11/16/2023)   Overall Financial Resource Strain (CARDIA)    Difficulty of Paying Living Expenses: Not hard at all  Food Insecurity: No Food Insecurity (11/16/2023)   Hunger Vital Sign    Worried About Running Out of Food in the Last Year: Never true    Ran Out of Food in the Last Year: Never true  Transportation Needs: No Transportation Needs (11/16/2023)   PRAPARE - Administrator, Civil Service (Medical): No    Lack of Transportation (Non-Medical): No  Physical Activity: Inactive (11/16/2023)   Exercise Vital Sign    Days of Exercise per Week: 0 days    Minutes of Exercise per Session: 0 min  Stress: No Stress Concern Present (11/16/2023)   [employer] of Occupational Health - Occupational Stress Questionnaire    Feeling of Stress: Not at all  Social Connections: Socially Integrated (11/16/2023)   Social Connection and Isolation Panel    Frequency of Communication with Friends and Family: More than three times a week    Frequency of Social Gatherings with Friends and Family: More than three times a week    Attends Religious Services: More than 4 times per year    Active Member of [employer] or Organizations: Yes    Attends Banker Meetings: More  than 4 times per year    Marital Status: Married    Tobacco Counseling Counseling given: Not Answered    Clinical Intake:  Pre-visit preparation completed: Yes  Pain : No/denies pain     BMI - recorded: 36.64 Nutritional Status: BMI > 30  Obese Nutritional Risks: None Diabetes: Yes CBG done?: No Did pt. bring in CBG monitor from home?: No  Lab Results  Component Value Date   HGBA1C 6.9 (H) 06/18/2023   HGBA1C 5.8 (H) 08/28/2022   HGBA1C 6.6 (H) 06/09/2022     How often do you need to have someone help you when  you read instructions, pamphlets, or other written materials from your doctor or pharmacy?: 1 - Never  Interpreter Needed?: No  Information entered by :: Rojelio Blush  LPN   Activities of Daily Living     11/16/2023   11:22 AM  In your present state of health, do you have any difficulty performing the following activities:  Hearing? 1  Comment Wears Hearing Aids  Vision? 0  Difficulty concentrating or making decisions? 0  Walking or climbing stairs? 1  Comment Uses a Cane  Dressing or bathing? 0  Doing errands, shopping? 0  Preparing Food and eating ? N  Using the Toilet? N  In the past six months, have you accidently leaked urine? N  Do you have problems with loss of bowel control? N  Managing your Medications? N  Managing your Finances? N  Housekeeping or managing your Housekeeping? N    Patient Care Team: Domenica Harlene LABOR, MD as PCP - General (Family Medicine) Jeffrie Oneil BROCKS, MD as PCP - Cardiology (Cardiology) Alline Lenis, MD (Inactive) as Consulting Physician (Urology) Abran Norleen SAILOR, MD as Consulting Physician (Gastroenterology) Jude Harden GAILS, MD as Consulting Physician (Pulmonary Disease) Rolan Ezra RAMAN, MD as Consulting Physician (Cardiology) Ernie Cough, MD as Consulting Physician (Orthopedic Surgery) [facility] Duwayne Purchase, MD as Consulting Physician (Orthopedic Surgery) Elspeth Lauraine DEL, OD as Referring Physician (Optometry)  I have updated your Care Teams any recent Medical Services you may have received from other providers in the past year.     Assessment:   This is a routine wellness examination for Brent [name].  Hearing/Vision screen Hearing Screening - Comments:: Wears Hearing Aids Vision Screening - Comments:: Wears rx glasses - up to date with routine eye exams with  [facility]   Goals Addressed               This Visit's Progress     Increase physical activity (pt-stated)        Remain Active        Depression Screen     11/16/2023   11:20 AM 06/09/2022    2:29 PM 05/12/2022    2:28 PM 12/09/2021    2:38 PM 12/01/2021   12:54 PM 10/06/2021   10:25 AM 06/18/2020   11:09 AM  PHQ 2/9 Scores  PHQ - 2 Score 0 0 0 0 0 0 0  PHQ- 9 Score  0 0        Fall Risk     11/16/2023   11:23 AM 06/21/2023    1:53 PM 06/09/2022    2:29 PM 05/12/2022    2:28 PM 12/09/2021    2:38 PM  Fall Risk   Falls in the past year? 0 0 0 0 0  Number falls in past yr: 0 0 0 0 0  Injury with Fall? 0 0 0 0  0  Risk for fall due to : No Fall Risks  No Fall Risks    Follow up Falls evaluation completed  Falls evaluation completed Falls evaluation completed       Data saved with a previous flowsheet row definition    MEDICARE RISK AT HOME:  Medicare Risk at Home Any stairs in or around the home?: No If so, are there any without handrails?: Yes Home free of loose throw rugs in walkways, pet beds, electrical cords, etc?: Yes Adequate lighting in your home to reduce risk of falls?: Yes Life alert?: No Use of a cane, walker or w/c?: Yes Grab bars in the bathroom?: No Shower chair or bench in shower?: No Elevated toilet seat or a handicapped toilet?: No  TIMED UP AND GO:  Was the test performed?  No  Cognitive Function: 6CIT completed        11/16/2023   11:24 AM  6CIT Screen  What Year? 0 points  What month? 0 points  What time? 0 points  Count back from 20 0 points  Months in reverse 0 points  Repeat phrase 0 points  Total Score 0 points    Immunizations Immunization History  Administered Date(s) Administered    sv, Bivalent, Protein Subunit Rsvpref,pf (Abrysvo) 01/14/2022   Fluad Quad(high Dose 65+) 01/14/2021   Influenza Split 02/11/2011, 02/02/2012   Influenza Whole 01/18/2010, 03/04/2013   Influenza, High Dose Seasonal PF 02/11/2023   Influenza, Seasonal, Injecte, Preservative Fre 02/11/2011, 02/02/2012   Influenza,inj,Quad PF,6+ Mos 05/08/2014, 04/02/2015, 01/25/2018, 01/20/2019,  01/11/2020   Influenza,inj,quad, With Preservative 01/18/2017   Influenza-Unspecified 01/14/2022   Janssen (J&J) SARS-COV-2 Vaccination 07/01/2019, 03/18/2020, 03/19/2021   MMR 05/12/2022   PNEUMOCOCCAL CONJUGATE-20 01/14/2021   PPD Test 05/12/2022   Pneumococcal Conjugate-13 05/08/2014   Pneumococcal Polysaccharide-23 05/27/2015   Respiratory Syncytial Virus Vaccine,Recomb Aduvanted(Arexvy) 01/14/2022   Td 04/20/1996, 10/29/2008   Td (Adult),5 Lf Tetanus Toxid, Preservative Free 01/14/2022   Tdap 03/01/2012   Zoster Recombinant(Shingrix) 08/12/2017, 10/12/2017   Zoster, Live 12/02/2015    Screening Tests Health Maintenance  Topic Date Due   Diabetic kidney evaluation - Urine ACR  05/19/2007   FOOT EXAM  04/22/2023   INFLUENZA VACCINE  11/19/2023   HEMOGLOBIN A1C  12/16/2023   Diabetic kidney evaluation - eGFR measurement  06/17/2024   OPHTHALMOLOGY EXAM  09/22/2024   Medicare Annual Wellness (AWV)  11/15/2024   Colonoscopy  09/02/2026   DTaP/Tdap/Td (5 - Td or Tdap) 01/15/2032   Pneumococcal Vaccine: 50+ Years  Completed   Hepatitis C Screening  Completed   Zoster Vaccines- Shingrix  Completed   Hepatitis B Vaccines  Aged Out   HPV VACCINES  Aged Out   Meningococcal B Vaccine  Aged Out   COVID-19 Vaccine  Discontinued    Health Maintenance  Health Maintenance Due  Topic Date Due   Diabetic kidney evaluation - Urine ACR  05/19/2007   FOOT EXAM  04/22/2023   Health Maintenance Items Addressed:   Additional Screening:  Vision Screening: Recommended annual ophthalmology exams for early detection of glaucoma and other disorders of the eye. Would you like a referral to an eye doctor? No    Dental Screening: Recommended annual dental exams for proper oral hygiene  Community Resource Referral / Chronic Care Management: CRR required this visit?  No   CCM required this visit?  No   Plan:    I have personally reviewed and noted the following in the patient's  chart:   Medical and  social history Use of alcohol, tobacco or illicit drugs  Current medications and supplements including opioid prescriptions. Patient is currently taking opioid prescriptions. Information provided to patient regarding non-opioid alternatives. Patient advised to discuss non-opioid treatment plan with their provider. Functional ability and status Nutritional status Physical activity Advanced directives List of other physicians Hospitalizations, surgeries, and ER visits in previous 12 months Vitals Screenings to include cognitive, depression, and falls Referrals and appointments  In addition, I have reviewed and discussed with patient certain preventive protocols, quality metrics, and best practice recommendations. A written personalized care plan for preventive services as well as general preventive health recommendations were provided to patient.   Rojelio LELON Blush, LPN   [id number]/[id number]   After Visit Summary: (MyChart) Due to this being a telephonic visit, the after visit summary with patients personalized plan was offered to patient via MyChart   Notes: Nothing significant to report at this time.

## 2023-11-16 NOTE — Patient Instructions (Addendum)
 [name] , Thank you for taking time out of your busy schedule to complete your Annual Wellness Visit with me. I enjoyed our conversation and look forward to speaking with you again next year. I, as well as your care team,  appreciate your ongoing commitment to your health goals. Please review the following plan we discussed and let me know if I can assist you in the future. Your Game plan/ To Do List    Referrals: If you haven't heard from the office you've been referred to, please reach out to them at the phone provided.   Follow up Visits: Next Medicare AWV with our clinical staff:    Have you seen your provider in the last 6 months (3 months if uncontrolled diabetes)?  Next Office Visit with your provider: 01/06/24 @ 1:20p  Clinician Recommendations:  Aim for 30 minutes of exercise or brisk walking, 6-8 glasses of water , and 5 servings of fruits and vegetables each day.       This is a list of the screening recommended for you and due dates:  Health Maintenance  Topic Date Due   Yearly kidney health urinalysis for diabetes  05/19/2007   Complete foot exam   04/22/2023   Flu Shot  11/19/2023   Hemoglobin A1C  12/16/2023   Yearly kidney function blood test for diabetes  06/17/2024   Eye exam for diabetics  09/22/2024   Medicare Annual Wellness Visit  11/15/2024   Colon Cancer Screening  09/02/2026   DTaP/Tdap/Td vaccine (5 - Td or Tdap) 01/15/2032   Pneumococcal Vaccine for age over 57  Completed   Hepatitis C Screening  Completed   Zoster (Shingles) Vaccine  Completed   Hepatitis B Vaccine  Aged Out   HPV Vaccine  Aged Out   Meningitis B Vaccine  Aged Out   COVID-19 Vaccine  Discontinued  Opioid Pain Medicine Management Opioids are powerful medicines that are used to treat moderate to severe pain. When used for short periods of time, they can help you to: Sleep better. Do better in physical or occupational therapy. Feel better in the first few days after an injury. Recover  from surgery. Opioids should be taken with the supervision of a trained health care provider. They should be taken for the shortest period of time possible. This is because opioids can be addictive, and the longer you take opioids, the greater your risk of addiction. This addiction can also be called opioid use disorder. What are the risks? Using opioid pain medicines for longer than 3 days increases your risk of side effects. Side effects include: Constipation. Nausea and vomiting. Breathing difficulties (respiratory depression). Drowsiness. Confusion. Opioid use disorder. Itching. Taking opioid pain medicine for a long period of time can affect your ability to do daily tasks. It also puts you at risk for: Motor vehicle crashes. Depression. Suicide. Heart attack. Overdose, which can be life-threatening. What is a pain treatment plan? A pain treatment plan is an agreement between you and your health care provider. Pain is unique to each person, and treatments vary depending on your condition. To manage your pain, you and your health care provider need to work together. To help you do this: Discuss the goals of your treatment, including how much pain you might expect to have and how you will manage the pain. Review the risks and benefits of taking opioid medicines. Remember that a good treatment plan uses more than one approach and minimizes the chance of side effects. Be honest  about the amount of medicines you take and about any drug or alcohol use. Get pain medicine prescriptions from only one health care provider. Pain can be managed with many types of alternative treatments. Ask your health care provider to refer you to one or more specialists who can help you manage pain through: Physical or occupational therapy. Counseling (cognitive behavioral therapy). Good nutrition. Biofeedback. Massage. Meditation. Non-opioid medicine. Following a gentle exercise program. How to use opioid  pain medicine Taking medicine Take your pain medicine exactly as told by your health care provider. Take it only when you need it. If your pain gets less severe, you may take less than your prescribed dose if your health care provider approves. If you are not having pain, do nottake pain medicine unless your health care provider tells you to take it. If your pain is severe, do nottry to treat it yourself by taking more pills than instructed on your prescription. Contact your health care provider for help. Write down the times when you take your pain medicine. It is easy to become confused while on pain medicine. Writing the time can help you avoid overdose. Take other over-the-counter or prescription medicines only as told by your health care provider. Keeping yourself and others safe  While you are taking opioid pain medicine: Do not drive, use machinery, or power tools. Do not sign legal documents. Do not drink alcohol. Do not take sleeping pills. Do not supervise children by yourself. Do not do activities that require climbing or being in high places. Do not go to a lake, river, ocean, spa, or swimming pool. Do not share your pain medicine with anyone. Keep pain medicine in a locked cabinet or in a secure area where pets and children cannot reach it. Stopping your use of opioids If you have been taking opioid medicine for more than a few weeks, you may need to slowly decrease (taper) how much you take until you stop completely. Tapering your use of opioids can decrease your risk of symptoms of withdrawal, such as: Pain and cramping in the abdomen. Nausea. Sweating. Sleepiness. Restlessness. Uncontrollable shaking (tremors). Cravings for the medicine. Do not attempt to taper your use of opioids on your own. Talk with your health care provider about how to do this. Your health care provider may prescribe a step-down schedule based on how much medicine you are taking and how long you  have been taking it. Getting rid of leftover pills Do not save any leftover pills. Get rid of leftover pills safely by: Taking the medicine to a prescription take-back program. This is usually offered by the county or law enforcement. Bringing them to a pharmacy that has a drug disposal container. Flushing them down the toilet. Check the label or package insert of your medicine to see whether this is safe to do. Throwing them out in the trash. Check the label or package insert of your medicine to see whether this is safe to do. If it is safe to throw it out, remove the medicine from the original container, put it into a sealable bag or container, and mix it with used coffee grounds, food scraps, dirt, or cat litter before putting it in the trash. Follow these instructions at home: Activity Do exercises as told by your health care provider. Avoid activities that make your pain worse. Return to your normal activities as told by your health care provider. Ask your health care provider what activities are safe for you. General instructions You may  need to take these actions to prevent or treat constipation: Drink enough fluid to keep your urine pale yellow. Take over-the-counter or prescription medicines. Eat foods that are high in fiber, such as beans, whole grains, and fresh fruits and vegetables. Limit foods that are high in fat and processed sugars, such as fried or sweet foods. Keep all follow-up visits. This is important. Where to find support If you have been taking opioids for a long time, you may benefit from receiving support for quitting from a local support group or counselor. Ask your health care provider for a referral to these resources in your area. Where to find more information Centers for Disease Control and Prevention (CDC): FootballExhibition.com.br [place] Food and Drug Administration (FDA): PumpkinSearch.com.ee Get help right away if: You may have taken too much of an opioid (overdosed). Common  symptoms of an overdose: Your breathing is slower or more shallow than normal. You have a very slow heartbeat (pulse). You have slurred speech. You have nausea and vomiting. Your pupils become very small. You have other potential symptoms: You are very confused. You faint or feel like you will faint. You have cold, clammy skin. You have blue lips or fingernails. You have thoughts of harming yourself or harming others. These symptoms may represent a serious problem that is an emergency. Do not wait to see if the symptoms will go away. Get medical help right away. Call your local emergency services (911 in the [place] Do not drive yourself to the hospital.  If you ever feel like you may hurt yourself or others, or have thoughts about taking your own life, get help right away. Go to your nearest emergency department or: Call your local emergency services (911 in the [place] Call the [facility] ((920) 084-8714 in the [place] Call a suicide crisis helpline, such as the National Suicide Prevention Lifeline at 210-717-8580 or 988 in the [place] This is open 24 hours a day in the [place] If you're a Veteran: Call 988 and press 1. This is open 24 hours a day. Text the [employer] at (309) 102-3659. Summary Opioid medicines can help you manage moderate to severe pain for a short period of time. A pain treatment plan is an agreement between you and your health care provider. Discuss the goals of your treatment, including how much pain you might expect to have and how you will manage the pain. If you think that you or someone else may have taken too much of an opioid, get medical help right away. This information is not intended to replace advice given to you by your health care provider. Make sure you discuss any questions you have with your health care provider. Document Revised: 01/11/2023 Document Reviewed: 07/17/2020 Elsevier Patient Education  2024 Elsevier Inc.  Advanced  directives: (Copy Requested) Please bring a copy of your health care power of attorney and living will to the office to be added to your chart at your convenience. You can mail to [facility] [address] [place], [place] [address] or email to [email or web address] Advance Care Planning is important because it:  [x]  Makes sure you receive the medical care that is consistent with your values, goals, and preferences  [x]  It provides guidance to your family and loved ones and reduces their decisional burden about whether or not they are making the right decisions based on your wishes.  Follow the link provided in your after visit summary or read over the paperwork we have  mailed to you to help you started getting your Advance Directives in place. If you need assistance in completing these, please reach out to us  so that we can help you!  See attachments for Preventive Care and Fall Prevention Tips.

## 2023-11-25 DIAGNOSIS — M51361 Other intervertebral disc degeneration, lumbar region with lower extremity pain only: Secondary | ICD-10-CM | POA: Diagnosis not present

## 2023-11-25 DIAGNOSIS — Z5181 Encounter for therapeutic drug level monitoring: Secondary | ICD-10-CM | POA: Diagnosis not present

## 2023-11-25 DIAGNOSIS — M48061 Spinal stenosis, lumbar region without neurogenic claudication: Secondary | ICD-10-CM | POA: Diagnosis not present

## 2023-11-30 ENCOUNTER — Telehealth: Payer: Self-pay | Admitting: Cardiology

## 2023-11-30 NOTE — Telephone Encounter (Signed)
 Spoke with pt who has been having DOE and cp with pain between his shoulder blades off and on for several weeks.  No current s/s.  He denies any jaw, neck, shoulder,elbow pain and no n/v.  He reports usually one SL helps resolve his shoulder blade discomfort but he is concerned because this is where he had pain before when he had to have a stent placement.  He will continue to monitor s/s and use SL Ntg if needed.  ED precautions reviewed.  He has been scheduled for further evaluation on Friday with Dr Michele.

## 2023-11-30 NOTE — Telephone Encounter (Signed)
  Per MyChart scheduling message:  Pt c/o Shortness Of Breath: STAT if SOB developed within the last 24 hours or pt is noticeably SOB on the phone  1. Are you currently SOB (can you hear that pt is SOB on the phone)?   2. How long have you been experiencing SOB?   3. Are you SOB when sitting or when up moving around?   4. Are you currently experiencing any other symptoms?     I am not out of breath when sitting. Movement and moderate exercise such as going up a flight of Stairs does cause me to get out of breath.     I have been experiencing this for the past 2 months.    Moderate movement such as walking outside causes me to be out of breath.    I have been expert angina pain almost on a daily basis. Taking my fast acting nitroglycerin  does reduces the pain

## 2023-12-03 ENCOUNTER — Ambulatory Visit: Attending: Cardiology | Admitting: Cardiology

## 2023-12-03 ENCOUNTER — Encounter: Payer: Self-pay | Admitting: Cardiology

## 2023-12-03 VITALS — BP 138/60 | HR 71 | Resp 16 | Ht 68.0 in | Wt 249.0 lb

## 2023-12-03 DIAGNOSIS — I10 Essential (primary) hypertension: Secondary | ICD-10-CM

## 2023-12-03 DIAGNOSIS — E66812 Obesity, class 2: Secondary | ICD-10-CM

## 2023-12-03 DIAGNOSIS — E782 Mixed hyperlipidemia: Secondary | ICD-10-CM

## 2023-12-03 DIAGNOSIS — G4733 Obstructive sleep apnea (adult) (pediatric): Secondary | ICD-10-CM

## 2023-12-03 DIAGNOSIS — I25118 Atherosclerotic heart disease of native coronary artery with other forms of angina pectoris: Secondary | ICD-10-CM | POA: Diagnosis not present

## 2023-12-03 DIAGNOSIS — Z6837 Body mass index (BMI) 37.0-37.9, adult: Secondary | ICD-10-CM

## 2023-12-03 DIAGNOSIS — I2510 Atherosclerotic heart disease of native coronary artery without angina pectoris: Secondary | ICD-10-CM | POA: Diagnosis not present

## 2023-12-03 MED ORDER — ISOSORBIDE MONONITRATE ER 120 MG PO TB24: 120.0000 mg | ORAL_TABLET | Freq: Every day | ORAL | 3 refills | Status: DC

## 2023-12-03 NOTE — Patient Instructions (Addendum)
 Medication Instructions:  INCREASE Isosorbide  Mononitrate (Imdur ) to 120 mg once daily  *If you need a refill on your cardiac medications before your next appointment, please call your pharmacy*  Lab Work: To be completed today: BMP and CBC  If you have labs (blood work) drawn today and your tests are completely normal, you will receive your results only by: MyChart Message (if you have MyChart) OR A paper copy in the mail If you have any lab test that is abnormal or we need to change your treatment, we will call you to review the results.  Testing/Procedures: Your physician has requested that you have a cardiac catheterization. Cardiac catheterization is used to diagnose and/or treat various heart conditions. Doctors may recommend this procedure for a number of different reasons. The most common reason is to evaluate chest pain. Chest pain can be a symptom of coronary artery disease (CAD), and cardiac catheterization can show whether plaque is narrowing or blocking your heart's arteries. This procedure is also used to evaluate the valves, as well as measure the blood flow and oxygen levels in different parts of your heart. For further information please visit [email or web address] Please follow instruction sheet, as given.   Your physician has requested that you have an echocardiogram at next available appointment slot. Echocardiography is a painless test that uses sound waves to create images of your heart. It provides your doctor with information about the size and shape of your heart and how well your heart's chambers and valves are working. This procedure takes approximately one hour. There are no restrictions for this procedure. Please do NOT wear cologne, perfume, aftershave, or lotions (deodorant is allowed). Please arrive 15 minutes prior to your appointment time.  Please note: We ask at that you not bring children with you during ultrasound (echo/ vascular) testing. Due to room size and  safety concerns, children are not allowed in the ultrasound rooms during exams. Our front office staff cannot provide observation of children in our lobby area while testing is being conducted. An adult accompanying a patient to their appointment will only be allowed in the ultrasound room at the discretion of the ultrasound technician under special circumstances. We apologize for any inconvenience.    Your physician has requested that you have a cardiac catheterization. Cardiac catheterization is used to diagnose and/or treat various heart conditions. Doctors may recommend this procedure for a number of different reasons. The most common reason is to evaluate chest pain. Chest pain can be a symptom of coronary artery disease (CAD), and cardiac catheterization can show whether plaque is narrowing or blocking your heart's arteries. This procedure is also used to evaluate the valves, as well as measure the blood flow and oxygen levels in different parts of your heart. For further information please visit [email or web address] Please follow instruction sheet, BELOW:        Cardiac/Peripheral Catheterization   You are scheduled for a Cardiac Catheterization on Wednesday, August 27 with Dr. Newman Lawrence.  1. Please arrive at the [facility] (Main Entrance A) at [facility]: [address] [place], [place] [address] at 6:30 AM (This time is 2 hour(s) before your procedure to ensure your preparation).   Free valet parking service is available. You will check in at ADMITTING. The support person will be asked to wait in the waiting room.  It is OK to have someone drop you off and come back when you are ready to be discharged.        Special  note: Every effort is made to have your procedure done on time. Please understand that emergencies sometimes delay scheduled procedures.  2. Diet: No solid foods after midnight. You may have clear liquids until you arrive at the hospital.  List of  approved liquids water , clear juice, clear tea, black coffee, fruit juices, non-citric and without pulp, carbonated beverages, Gatorade, Kool -Aid, plain Jello-O and plain ice popsicles.  3. Hydration: You need to be well hydrated before your procedure time. You may drink approved liquids (see below) until you arrive at the hospital. On the way to the hospital, please drink a 16-oz (1 plastic bottle) of water .   List of approved liquids water , clear juice, clear tea, black coffee, fruit juices, non-citric and without pulp, carbonated beverages, Gatorade, Kool -Aid, plain Jello-O and plain ice popsicles.  4. Labs: You will need to have blood drawn on TODAY  5. Medication instructions in preparation for your procedure:   Contrast Allergy: No    Stop taking, Cozaar  (Losartan ) &  HTCZ (Hydrochlorothiazide ) Tuesday, August 26, NEEDS TO BE AT LEASE HELD FOR 24 HOURS PRIOR TO YOUR PROCEDURE  DO NOT TAKE ANOTHER SEMAGLUTIDE INJECTION UNTIL AFTER YOUR CATH    On the morning of your procedure, take Aspirin  81 mg and any morning medicines NOT listed above.  You may use sips of water .  6. Plan to go home the same day, you will only stay overnight if medically necessary. 7. You MUST have a responsible adult to drive you home. 8. An adult MUST be with you the first 24 hours after you arrive home. 9. Bring a current list of your medications, and the last time and date medication taken. 10. Bring ID and current insurance cards. 11.Please wear clothes that are easy to get on and off and wear slip-on shoes.  Thank you for allowing us  to care for you!   -- [facility]  Follow-Up: At [facility], you and your health needs are our priority.  As part of our continuing mission to provide you with exceptional heart care, our providers are all part of one team.  This team includes your primary Cardiologist (physician) and Advanced Practice Providers or APPs  (Physician Assistants and Nurse Practitioners) who all work together to provide you with the care you need, when you need it.  Your next appointment:   4 week(s) after heart cath  Provider:   Oneil Parchment, MD or APP   We recommend signing up for the patient portal called MyChart.  Sign up information is provided on this After Visit Summary.  MyChart is used to connect with patients for Virtual Visits (Telemedicine).  Patients are able to view lab/test results, encounter notes, upcoming appointments, etc.  Non-urgent messages can be sent to your provider as well.   To learn more about what you can do with MyChart, go to [email or web address].   Other Instructions

## 2023-12-03 NOTE — Progress Notes (Signed)
 Cardiology Office Note:  .   Date:  12/03/2023  ID:  [name], DOB February 25, 1956, MRN [id number] PCP:  Domenica Harlene LABOR, MD  [facility] Providers Cardiologist:  Oneil Parchment, MD  Electrophysiologist:  None  Click to update primary MD,subspecialty MD or APP then REFRESH:1}    Chief Complaint  Patient presents with   Shortness of Breath   Chest Pain    DOD sick visit     History of Present Illness: .   Brent [name] is a 68 y.o. Caucasian male whose past medical history and cardiovascular risk factors includes: CAD status post DES to RCA, diabetes mellitus type 2, OSA (on CPAP), GI bleed, HTN, HLD, morbid obesity status post gastric bypass 2012, anemia, IDA.  Patient's primary cardiologist is Dr. Oneil Parchment last seen by [name] in November 2024 (progress note reviewed).  I am seeing him for an acute visit for chest pain and shortness of breath on DOD day.   Patient is accompanied by his wife at today's visit.  Chest pain: Left-sided. Intensity 3 out of 10. Dull ache like sensation. Worse with ambulation, especially going up stairs and overexertion. Improves with sublingual nitroglycerin  tablet Has been taking 1 or 2 sublingual nitroglycerin  tablets daily since July/August 2025 (in addition to his antianginal therapy). For reasons unknown he did not go to the ED for further evaluation and management despite his current symptoms being similar to his anginal equivalents back in 2023. Associated symptoms include difficulty breathing Denies nausea, vomiting, diaphoresis, syncope No active chest pain  Review of Systems: .   Review of Systems  Cardiovascular:  Positive for chest pain and dyspnea on exertion. Negative for claudication, irregular heartbeat, leg swelling, near-syncope, orthopnea, palpitations, paroxysmal nocturnal dyspnea and syncope.  Respiratory:  Positive for shortness of breath.   Hematologic/Lymphatic: Negative for bleeding problem.    Studies  Reviewed:   EKG: EKG Interpretation Date/Time:  Friday December 03 2023 14:44:56 EDT Ventricular Rate:  78 PR Interval:  156 QRS Duration:  70 QT Interval:  396 QTC Calculation: 451 R Axis:   14  Text Interpretation: Normal sinus rhythm Cannot rule out Anterior infarct , age undetermined When compared with ECG of 08-Mar-2023 15:49, Minimal criteria for Anterior infarct are now Present Confirmed by Michele Richardson [phone]) on 12/03/2023 2:53:11 PM  Echocardiogram: 12/08/2021 1. Left ventricular ejection fraction, by estimation, is 55 to 60%. The  left ventricle has normal function. The left ventricle has no regional  wall motion abnormalities. Left ventricular diastolic parameters are  indeterminate.   2. Right ventricular systolic function is normal. The right ventricular  size is normal. There is normal pulmonary artery systolic pressure. The  estimated right ventricular systolic pressure is 31.5 mmHg.   3. The mitral valve is grossly normal. Mild mitral valve regurgitation.  No evidence of mitral stenosis.   4. The aortic valve is tricuspid. There is mild calcification of the  aortic valve. Aortic valve regurgitation is mild. Aortic valve sclerosis  is present, with no evidence of aortic valve stenosis.   5. The inferior vena cava is normal in size with greater than 50%  respiratory variability, suggesting right atrial pressure of 3 mmHg.   Stress Testing: Cardiac PET/CT 09/2022   Findings are consistent with no ischemia and no infarction. The study is low to intermediate risk due to the presence of mildly reduced myocardial blood flow reserve. Given that perfusion is normal, there is no TID, and EF augments with stress, this  is likely due to microvascular disease in the setting of CKD however we cannot rule out multivessel CAD. Recommend clinical correlation.   LV perfusion is normal. There is no evidence of ischemia. There is no evidence of infarction.   Rest left ventricular function  is normal. Rest EF: 49 %. Stress left ventricular function is normal. Stress EF: 59 %. End diastolic cavity size is normal. End systolic cavity size is normal.   Myocardial blood flow was computed to be 0.26ml/g/min at rest and 1.50ml/g/min at stress. Global myocardial blood flow reserve was 1.70 and was mildly abnormal.   Coronary calcium  assessment not performed due to prior revascularization.   Electronically Signed: Powell Sorrow, MD  RADIOLOGY: NA  Risk Assessment/Calculations:   NA   Labs:       Latest Ref Rng & Units 07/13/2023   12:55 PM 03/03/2023    2:06 PM 12/31/2022    1:39 PM  CBC  WBC 4.0 - 10.5 K/uL 5.5  4.3  6.0   Hemoglobin 13.0 - 17.0 g/dL 88.4  88.1  88.1   Hematocrit 39.0 - 52.0 % 35.2  36.2  36.3   Platelets 150 - 400 K/uL 181  196  208        Latest Ref Rng & Units 06/18/2023    8:39 AM 10/02/2022    2:04 PM 08/28/2022   10:17 AM  BMP  Glucose 70 - 99 mg/dL 833  847  848   BUN 6 - 23 mg/dL 21  25  23    Creatinine 0.40 - 1.50 mg/dL 8.58  8.71  8.47   Sodium 135 - 145 mEq/L 140  137  132   Potassium 3.5 - 5.1 mEq/L 4.6  4.4  4.8   Chloride 96 - 112 mEq/L 102  104  102   CO2 19 - 32 mEq/L 27  24  21    Calcium  8.4 - 10.5 mg/dL 9.3  9.0  8.6       Latest Ref Rng & Units 06/18/2023    8:39 AM 10/02/2022    2:04 PM 08/28/2022   10:17 AM  CMP  Glucose 70 - 99 mg/dL 833  847  848   BUN 6 - 23 mg/dL 21  25  23    Creatinine 0.40 - 1.50 mg/dL 8.58  8.71  8.47   Sodium 135 - 145 mEq/L 140  137  132   Potassium 3.5 - 5.1 mEq/L 4.6  4.4  4.8   Chloride 96 - 112 mEq/L 102  104  102   CO2 19 - 32 mEq/L 27  24  21    Calcium  8.4 - 10.5 mg/dL 9.3  9.0  8.6   Total Protein 6.0 - 8.3 g/dL 6.6     Total Bilirubin 0.2 - 1.2 mg/dL 0.6     Alkaline Phos 39 - 117 U/L 31     AST 0 - 37 U/L 17     ALT 0 - 53 U/L 9       Lab Results  Component Value Date   CHOL 119 06/18/2023   HDL 46.50 06/18/2023   LDLCALC 53 06/18/2023   LDLDIRECT 74.0 05/22/2021   TRIG  100.0 06/18/2023   CHOLHDL 3 06/18/2023   No results for input(s): LIPOA in the last 8760 hours. No components found for: NTPROBNP No results for input(s): PROBNP in the last 8760 hours. Recent Labs    06/18/23 0839  TSH 2.08    Physical Exam:    Today's Vitals  12/03/23 1441  BP: 138/60  Pulse: 71  Resp: 16  SpO2: 97%  Weight: 249 lb (112.9 kg)  Height: 5' 8 (1.727 m)   Body mass index is 37.86 kg/m. Wt Readings from Last 3 Encounters:  12/03/23 249 lb (112.9 kg)  11/16/23 241 lb (109.3 kg)  07/13/23 249 lb 12.8 oz (113.3 kg)    Physical Exam  Constitutional: No distress.  hemodynamically stable  Neck: No JVD present.  Cardiovascular: Normal rate, regular rhythm, S1 normal and S2 normal. Exam reveals no gallop, no S3 and no S4.  No murmur heard. Pulmonary/Chest: Effort normal and breath sounds normal. No stridor. He has no wheezes. He has no rales.  Musculoskeletal:        General: No edema.     Cervical back: Neck supple.  Skin: Skin is warm.     Impression & Recommendation(s):  Impression:   ICD-10-CM   1. 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 Essential hypertension  I10 EKG 12-Lead    isosorbide  mononitrate (IMDUR ) 120 MG 24 hr tablet    ECHOCARDIOGRAM COMPLETE    3. Obstructive sleep apnea on CPAP  G47.33     4. Mixed hyperlipidemia  E78.2     5. Class 2 severe obesity due to excess calories with serious comorbidity and body mass index (BMI) of 37.0 to 37.9 in adult [facility])  Z33.187    E66.01    Z68.37        Recommendation(s):  Coronary artery disease involving native heart with angina pectoris Symptom onset July/August 2025 No active symptoms. Has been taking 1 to 2 tablets of sublingual nitroglycerin  tablets daily since  July/August 2025. Unable to explain why he has not gone to the ED for more expedited evaluation. EKG today is nonischemic Has had cardiac PET/CT in the recent past which noted concern for either macrovascular dysfunction versus multivessel CAD. His current symptoms are similar to his anginal discomfort that he had back in 2023 Patient has multiple cardiovascular risk factors and very high pretest probability for CAD. Discussed undergoing left heart catheterization with possible intervention, patient agrees Risks, benefits, and alternatives discussed with patient and wife. Antianginal therapy: Lopressor  100 mg p.o. twice daily, Imdur  45 mg p.o. twice daily, sublingual nitroglycerin  tablets Will increase Imdur  from 45 mg p.o. twice daily to 120 mg p.o. every morning Check CBC and BMP prior to heart catheterization Echo will be ordered to evaluate for structural heart disease and left ventricular systolic function. Educated him on seeking medical attention sooner by going to the closest ER via EMS if the symptoms increase in intensity, frequency, duration, or if he uses 2 or more sublingual nitroglycerin  tablets as discussed  in the office.  Patient verbalized understanding. Avoid overexertion until the workup is complete.  Essential hypertension Office blood pressures are well-controlled. Medications reconciled  Obstructive sleep apnea on CPAP Patient endorses compliance with device therapy  Mixed hyperlipidemia Currently on fenofibrate , Zetia , pitavastatin  LDL 53 mg/dL as of February 2025, [facility] evidence  Will update his primary cardiologist Dr. Jeffrie.  Recommend follow-up in 4 weeks after  Medical Decision:  Complexity: High -anginal chest pain Discussed going to the hospital for higher level of care, patient prefers outpatient follow-up for now but if symptoms  progress we will go to the closest ER via EMS for further evaluation and management Independently reviewed: Last progress note  from [name] 03/08/2023 Prescription drug management: See above Independent historian: Wife Obtain informed consent for left heart catheterization with possible intervention Ordered additional diagnostic workup and labs  Orders Placed:  Orders Placed This Encounter  Procedures   CBC    Standing Status:   Future    Number of Occurrences:   1    Expected Date:   12/03/2023    Expiration Date:   12/02/2024   Basic metabolic panel with GFR    Standing Status:   Future    Number of Occurrences:   1    Expected Date:   12/03/2023    Expiration Date:   12/02/2024   EKG 12-Lead   ECHOCARDIOGRAM COMPLETE    Standing Status:   Future    Expected Date:   12/10/2023    Expiration Date:   12/02/2024    Where [facility] test [facility] performed[facility]    Does the patient weigh less than or greater than 250 lbs?:   Patient weighs less than 250 lbs             249 lbs    Perflutren  DEFINITY  (image enhancing agent) should be administered unless hypersensitivity or allergy exist:   Administer Perflutren     Reason for exam-Echo:   Other-Full Diagnosis List    Full ICD-10/Reason for Exam:   HTN (hypertension) [761881]     Final Medication List:    Meds ordered this encounter  Medications   isosorbide  mononitrate (IMDUR ) 120 MG 24 hr tablet    Sig: Take 1 tablet (120 mg total) by mouth daily.    Dispense:  30 tablet    Refill:  3    Medications Discontinued During This Encounter  Medication Reason   isosorbide  mononitrate (IMDUR ) 30 MG 24 hr tablet Dose change     Current Outpatient Medications:    acetaminophen  (TYLENOL ) 500 MG tablet, Take 1,000 mg by mouth every 6 (six) hours as needed for moderate pain or headache., Disp: , Rfl:    amitriptyline  (ELAVIL ) 50 MG tablet, Take 1 tablet (50 mg total) by mouth at bedtime., Disp: 90 tablet, Rfl: 2   amoxicillin  (AMOXIL ) 500 MG capsule, Take 2,000 mg by mouth See admin instructions. Take 2,000 mg prior to dental appointments, Disp:  , Rfl:    aspirin  EC 81 MG tablet, Take 1 tablet (81 mg total) by mouth daily. Swallow whole., Disp: 30 tablet, Rfl: 12   Cholecalciferol (VITAMIN D ) 50 MCG (2000 UT) tablet, Take 6,000 Units by mouth daily., Disp: , Rfl:    Cyanocobalamin  (VITAMIN B-12) 1000 MCG SUBL, Place 1,000 mcg under the tongue 2 (two) times a week., Disp: , Rfl:    docusate sodium  (COLACE) 100 MG capsule, Take 100 mg by mouth daily as needed for mild constipation., Disp: , Rfl:    EPINEPHRINE  0.3 mg/0.3 mL IJ SOAJ injection, INJECT 0.3 MG (0.3 ML) INTO THE MUSCLE ONCE FOR 1 DOSE AS NEEDED FOR ANAPHYLAXIS AS DIRECTED, Disp: 2 each, Rfl: 11   escitalopram (LEXAPRO) 20 MG tablet, Take 1 tablet (20 mg total) by mouth daily., Disp: , Rfl:    ezetimibe  (ZETIA ) 10 MG tablet, TAKE 1 TABLET DAILY, Disp: 90 tablet, Rfl: 1   famotidine  (PEPCID ) 40 MG tablet, TAKE 1 TABLET AT BEDTIME AS NEEDED FOR HEARTBURN OR INDIGESTION, Disp: 90 tablet, Rfl: 3  fenofibrate  160 MG tablet, TAKE 1 TABLET DAILY, Disp: 90 tablet, Rfl: 0   fluticasone  (FLONASE ) 50 MCG/ACT nasal spray, Place 2 sprays into both nostrils daily., Disp: 48 g, Rfl: 1   gabapentin  (NEURONTIN ) 300 MG capsule, Take 300 mg by mouth 3 (three) times daily. Pt takes 3 tablets 900 mg in the morning, 2 tablets 600 mg in the afternoon, and 3 tablets 900 mg at night/, Disp: , Rfl:    hydrochlorothiazide  (MICROZIDE ) 12.5 MG capsule, TAKE 1 CAPSULE DAILY, Disp: 90 capsule, Rfl: 1   isosorbide  mononitrate (IMDUR ) 120 MG 24 hr tablet, Take 1 tablet (120 mg total) by mouth daily., Disp: 30 tablet, Rfl: 3   losartan  (COZAAR ) 50 MG tablet, Take 1 tablet (50 mg total) by mouth daily., Disp: 90 tablet, Rfl: 1   Magnesium  100 MG CAPS, Take 1 capsule (100 mg total) by mouth daily., Disp: 30 capsule, Rfl: 1   methocarbamol  (ROBAXIN ) 500 MG tablet, TAKE 1 TABLET FOUR TIMES A DAY, Disp: 360 tablet, Rfl: 3   metoprolol  tartrate (LOPRESSOR ) 100 MG tablet, TAKE 1 TABLET TWICE A DAY, Disp: 180 tablet, Rfl:  2   mometasone  (ELOCON ) 0.1 % cream, Apply 1 Application topically daily. (Patient taking differently: Apply 1 Application topically daily as needed (irritation).), Disp: 90 g, Rfl: 1   Multiple Vitamin (MULTIVITAMIN) tablet, Take 1 tablet by mouth daily., Disp: , Rfl:    nitroGLYCERIN  (NITROSTAT ) 0.4 MG SL tablet, Place 1 tablet (0.4 mg total) under the tongue every 5 (five) minutes as needed., Disp: 25 tablet, Rfl: 3   NON FORMULARY, Pt uses a cpap nightly, Disp: , Rfl:    oxycodone  (OXY-IR) 5 MG capsule, Take 5 mg by mouth in the morning, at noon, in the evening, and at bedtime., Disp: , Rfl:    pantoprazole  (PROTONIX ) 40 MG tablet, Take 1 tablet (40 mg total) by mouth 2 (two) times daily before a meal., Disp: 180 tablet, Rfl: 1   Pitavastatin  Calcium  2 MG TABS, TAKE 1 TABLET DAILY, Disp: 90 tablet, Rfl: 3   Semaglutide, 1 MG/DOSE, 4 MG/3ML SOPN, Inject 1 mg as directed once a week., Disp: , Rfl:   Consent:   Informed Consent   Shared Decision Making/Informed Consent The risks [stroke (1 in 1000), death (1 in 1000), kidney failure [usually temporary] (1 in 500), bleeding (1 in 200), allergic reaction [possibly serious] (1 in 200)], benefits (diagnostic support and management of coronary artery disease) and alternatives of a cardiac catheterization were discussed in detail with Mr. Borowiak and he is willing to proceed.     Disposition:   Follow-up with Dr. Jeffrie  His questions and concerns were addressed to his satisfaction. He voices understanding of the recommendations provided during this encounter.    Signed, Madonna Michele HAS, [facility]  A Division of [facility] [address], [place], [place] [address]  [place], [place] [address] 12/03/2023 7:17 PM

## 2023-12-04 ENCOUNTER — Ambulatory Visit: Payer: Self-pay | Admitting: Cardiology

## 2023-12-04 LAB — CBC
Hematocrit: 32.4 % — ABNORMAL LOW (ref 37.5–51.0)
Hemoglobin: 10.8 g/dL — ABNORMAL LOW (ref 13.0–17.7)
MCH: 30 pg (ref 26.6–33.0)
MCHC: 33.3 g/dL (ref 31.5–35.7)
MCV: 90 fL (ref 79–97)
Platelets: 197 x10E3/uL (ref 150–450)
RBC: 3.6 x10E6/uL — ABNORMAL LOW (ref 4.14–5.80)
RDW: 12.8 % (ref 11.6–15.4)
WBC: 5.3 x10E3/uL (ref 3.4–10.8)

## 2023-12-04 LAB — BASIC METABOLIC PANEL WITH GFR
BUN/Creatinine Ratio: 18 (ref 10–24)
BUN: 20 mg/dL (ref 8–27)
CO2: 20 mmol/L (ref 20–29)
Calcium: 9 mg/dL (ref 8.6–10.2)
Chloride: 103 mmol/L (ref 96–106)
Creatinine, Ser: 1.1 mg/dL (ref 0.76–1.27)
Glucose: 104 mg/dL — ABNORMAL HIGH (ref 70–99)
Potassium: 4.6 mmol/L (ref 3.5–5.2)
Sodium: 139 mmol/L (ref 134–144)
eGFR: 73 mL/min/1.73 (ref >59)

## 2023-12-07 ENCOUNTER — Ambulatory Visit: Admitting: Physician Assistant

## 2023-12-14 ENCOUNTER — Telehealth: Payer: Self-pay | Admitting: *Deleted

## 2023-12-14 NOTE — Telephone Encounter (Signed)
 Reviewed procedure instructions with patient.

## 2023-12-14 NOTE — Telephone Encounter (Signed)
 Pt returning call to a nurse

## 2023-12-14 NOTE — Telephone Encounter (Addendum)
 Cardiac Catheterization scheduled at [facility] for: Wednesday December 14, 2023 9 AM Arrival time [facility] Main Entrance A at: 7 AM-time change per Cath Lab  Diet: -Nothing to eat after midnight prior to procedure.  Hydration: -May drink clear liquids until 2 hours before the procedure.  Approved liquids: Water , clear tea, black coffee, fruit juices-non-citric and without pulp,Gatorade, plain Jello/popsicles.  -Drink 16 oz bottle of water  2 hours before the procedure.   Medication instructions: -Hold:  Hydrochlorothiazide -AM of procedure (pt was also previously instructed to hold losartan )  Ozempic-weekly on Tuesdays-pt did not take today -Other usual morning medications can be taken including aspirin  81 mg.  Plan to go home the same day, you will only stay overnight if medically necessary.  You must have responsible adult to drive you home.  Someone must be with you the first 24 hours after you arrive home.  Left message for patient to call back to review instructions

## 2023-12-15 ENCOUNTER — Other Ambulatory Visit (HOSPITAL_COMMUNITY): Payer: Self-pay

## 2023-12-15 ENCOUNTER — Other Ambulatory Visit: Payer: Self-pay

## 2023-12-15 ENCOUNTER — Encounter: Payer: Self-pay | Admitting: Family

## 2023-12-15 ENCOUNTER — Encounter (HOSPITAL_COMMUNITY)
Admission: RE | Disposition: A | Discharge status: Home / Self Care | Payer: Self-pay | Source: Home / Self Care | Attending: Cardiology

## 2023-12-15 ENCOUNTER — Ambulatory Visit (HOSPITAL_COMMUNITY)
Admission: RE | Admit: 2023-12-15 | Discharge: 2023-12-15 | Disposition: A | Discharge status: Home / Self Care | Attending: Cardiology | Admitting: Cardiology

## 2023-12-15 DIAGNOSIS — Z006 Encounter for examination for normal comparison and control in clinical research program: Secondary | ICD-10-CM

## 2023-12-15 DIAGNOSIS — M549 Dorsalgia, unspecified: Secondary | ICD-10-CM | POA: Diagnosis present

## 2023-12-15 DIAGNOSIS — G4733 Obstructive sleep apnea (adult) (pediatric): Secondary | ICD-10-CM | POA: Insufficient documentation

## 2023-12-15 DIAGNOSIS — I25110 Atherosclerotic heart disease of native coronary artery with unstable angina pectoris: Secondary | ICD-10-CM

## 2023-12-15 DIAGNOSIS — M545 Low back pain, unspecified: Secondary | ICD-10-CM | POA: Diagnosis present

## 2023-12-15 DIAGNOSIS — D500 Iron deficiency anemia secondary to blood loss (chronic): Secondary | ICD-10-CM | POA: Diagnosis present

## 2023-12-15 DIAGNOSIS — I25118 Atherosclerotic heart disease of native coronary artery with other forms of angina pectoris: Secondary | ICD-10-CM | POA: Insufficient documentation

## 2023-12-15 DIAGNOSIS — E782 Mixed hyperlipidemia: Secondary | ICD-10-CM | POA: Diagnosis not present

## 2023-12-15 DIAGNOSIS — Z955 Presence of coronary angioplasty implant and graft: Secondary | ICD-10-CM

## 2023-12-15 DIAGNOSIS — K922 Gastrointestinal hemorrhage, unspecified: Secondary | ICD-10-CM | POA: Diagnosis present

## 2023-12-15 DIAGNOSIS — I10 Essential (primary) hypertension: Secondary | ICD-10-CM | POA: Diagnosis not present

## 2023-12-15 DIAGNOSIS — K921 Melena: Secondary | ICD-10-CM | POA: Diagnosis present

## 2023-12-15 DIAGNOSIS — I2510 Atherosclerotic heart disease of native coronary artery without angina pectoris: Secondary | ICD-10-CM | POA: Diagnosis present

## 2023-12-15 HISTORY — PX: CORONARY STENT INTERVENTION: CATH118234

## 2023-12-15 HISTORY — PX: LEFT HEART CATH AND CORONARY ANGIOGRAPHY: CATH118249

## 2023-12-15 HISTORY — PX: CORONARY PRESSURE/FFR WITH 3D MAPPING: CATH118309

## 2023-12-15 LAB — BASIC METABOLIC PANEL WITH GFR
Anion gap: 5 (ref 5–15)
BUN: 19 mg/dL (ref 8–23)
CO2: 25 mmol/L (ref 22–32)
Calcium: 8.9 mg/dL (ref 8.9–10.3)
Chloride: 108 mmol/L (ref 98–111)
Creatinine, Ser: 1.28 mg/dL — ABNORMAL HIGH (ref 0.61–1.24)
GFR, Estimated: >60 mL/min (ref >60)
Glucose, Bld: 125 mg/dL — ABNORMAL HIGH (ref 70–99)
Potassium: 4.4 mmol/L (ref 3.5–5.1)
Sodium: 138 mmol/L (ref 135–145)

## 2023-12-15 LAB — POCT ACTIVATED CLOTTING TIME
Activated Clotting Time: 245 s
Activated Clotting Time: 268 s
Activated Clotting Time: 250 s
Activated Clotting Time: 331 s
Activated Clotting Time: 181 s
Activated Clotting Time: 164 s

## 2023-12-15 LAB — GLUCOSE, CAPILLARY
Glucose-Capillary: 146 mg/dL — ABNORMAL HIGH (ref 70–99)
Glucose-Capillary: 119 mg/dL — ABNORMAL HIGH (ref 70–99)

## 2023-12-15 LAB — CBC
HCT: 31.9 % — ABNORMAL LOW (ref 39.0–52.0)
Hemoglobin: 10.5 g/dL — ABNORMAL LOW (ref 13.0–17.0)
MCH: 29.7 pg (ref 26.0–34.0)
MCHC: 32.9 g/dL (ref 30.0–36.0)
MCV: 90.1 fL (ref 80.0–100.0)
Platelets: 169 K/uL (ref 150–400)
RBC: 3.54 MIL/uL — ABNORMAL LOW (ref 4.22–5.81)
RDW: 13.2 % (ref 11.5–15.5)
WBC: 4.8 K/uL (ref 4.0–10.5)
nRBC: 0 % (ref 0.0–0.2)

## 2023-12-15 SURGERY — LEFT HEART CATH AND CORONARY ANGIOGRAPHY
Anesthesia: LOCAL

## 2023-12-15 MED ORDER — SODIUM CHLORIDE 0.9 % IV SOLN: 250.0000 mL | INTRAVENOUS | Status: DC | PRN

## 2023-12-15 MED ORDER — SODIUM CHLORIDE 0.9 % IV SOLN: INTRAVENOUS | Status: AC

## 2023-12-15 MED ORDER — ACETAMINOPHEN 325 MG PO TABS
650.0000 mg | ORAL_TABLET | ORAL | Status: DC | PRN
  Administered 2023-12-15: 650 mg via ORAL

## 2023-12-15 MED ORDER — FENTANYL CITRATE (PF) 100 MCG/2ML IJ SOLN
INTRAMUSCULAR | Status: AC
  Filled 2023-12-15: qty 2

## 2023-12-15 MED ORDER — CLOPIDOGREL BISULFATE 75 MG PO TABS: 75.0000 mg | ORAL_TABLET | Freq: Every day | ORAL | Status: DC

## 2023-12-15 MED ORDER — ACETAMINOPHEN 325 MG PO TABS
ORAL_TABLET | ORAL | Status: AC
  Filled 2023-12-15: qty 2

## 2023-12-15 MED ORDER — SODIUM CHLORIDE 0.9% FLUSH: 3.0000 mL | INTRAVENOUS | Status: DC | PRN

## 2023-12-15 MED ORDER — ASPIRIN 81 MG PO CHEW: 81.0000 mg | CHEWABLE_TABLET | ORAL | Status: DC

## 2023-12-15 MED ORDER — HEPARIN (PORCINE) IN NACL 1000-0.9 UT/500ML-% IV SOLN
INTRAVENOUS | Status: DC | PRN
  Administered 2023-12-15: 1000 mL

## 2023-12-15 MED ORDER — HYDRALAZINE HCL 20 MG/ML IJ SOLN: 10.0000 mg | INTRAMUSCULAR | Status: AC | PRN

## 2023-12-15 MED ORDER — MIDAZOLAM HCL 2 MG/2ML IJ SOLN
INTRAMUSCULAR | Status: DC | PRN
Start: 2023-12-15 — End: 2023-12-16
  Administered 2023-12-15 (×2): 1 mg via INTRAVENOUS

## 2023-12-15 MED ORDER — SODIUM CHLORIDE 0.9% FLUSH: 3.0000 mL | Freq: Two times a day (BID) | INTRAVENOUS | Status: DC

## 2023-12-15 MED ORDER — NITROGLYCERIN 1 MG/10 ML FOR IR/CATH LAB
INTRA_ARTERIAL | Status: AC
  Filled 2023-12-15: qty 10

## 2023-12-15 MED ORDER — LABETALOL HCL 5 MG/ML IV SOLN: 10.0000 mg | INTRAVENOUS | Status: AC | PRN

## 2023-12-15 MED ORDER — MIDAZOLAM HCL 2 MG/2ML IJ SOLN
INTRAMUSCULAR | Status: AC
  Filled 2023-12-15: qty 2

## 2023-12-15 MED ORDER — FREE WATER: 500.0000 mL | Freq: Once | Status: DC

## 2023-12-15 MED ORDER — HEPARIN (PORCINE) IN NACL 2000-0.9 UNIT/L-% IV SOLN
INTRAVENOUS | Status: DC | PRN
Start: 2023-12-15 — End: 2023-12-16

## 2023-12-15 MED ORDER — ADENOSINE (DIAGNOSTIC) 140MCG/KG/MIN
INTRAVENOUS | Status: DC | PRN
  Administered 2023-12-15: 140 ug/kg/min via INTRAVENOUS

## 2023-12-15 MED ORDER — VERAPAMIL HCL 2.5 MG/ML IV SOLN
INTRAVENOUS | Status: AC
  Filled 2023-12-15: qty 2

## 2023-12-15 MED ORDER — HEPARIN SODIUM (PORCINE) 1000 UNIT/ML IJ SOLN
INTRAMUSCULAR | Status: AC
  Filled 2023-12-15: qty 10

## 2023-12-15 MED ORDER — NITROGLYCERIN 1 MG/10 ML FOR IR/CATH LAB
INTRA_ARTERIAL | Status: DC | PRN
  Administered 2023-12-15 (×2): 200 ug via INTRACORONARY

## 2023-12-15 MED ORDER — OXYCODONE HCL 5 MG PO TABS
5.0000 mg | ORAL_TABLET | Freq: Once | ORAL | Status: AC
  Administered 2023-12-15: 5 mg via ORAL
  Filled 2023-12-15: qty 1

## 2023-12-15 MED ORDER — CLOPIDOGREL BISULFATE 75 MG PO TABS
75.0000 mg | ORAL_TABLET | Freq: Every day | ORAL | 2 refills | Status: DC
  Filled 2023-12-15: qty 30, 30d supply, fill #0

## 2023-12-15 MED ORDER — SODIUM CHLORIDE 0.9 % IV SOLN
INTRAVENOUS | Status: DC | PRN
Start: 2023-12-15 — End: 2023-12-16
  Administered 2023-12-15: 10 mL/h via INTRAVENOUS

## 2023-12-15 MED ORDER — ADENOSINE 12 MG/4ML IV SOLN
INTRAVENOUS | Status: AC
  Filled 2023-12-15: qty 12

## 2023-12-15 MED ORDER — LIDOCAINE HCL (PF) 1 % IJ SOLN
INTRAMUSCULAR | Status: AC
  Filled 2023-12-15: qty 30

## 2023-12-15 MED ORDER — ADENOSINE 12 MG/4ML IV SOLN
INTRAVENOUS | Status: AC
Start: 2023-12-15 — End: 2023-12-15
  Filled 2023-12-15: qty 4

## 2023-12-15 MED ORDER — IOHEXOL 350 MG/ML SOLN
INTRAVENOUS | Status: DC | PRN
  Administered 2023-12-15: 210 mL

## 2023-12-15 MED ORDER — VERAPAMIL HCL 2.5 MG/ML IV SOLN
INTRAVENOUS | Status: DC | PRN
  Administered 2023-12-15: 10 mL via INTRA_ARTERIAL

## 2023-12-15 MED ORDER — FENTANYL CITRATE (PF) 100 MCG/2ML IJ SOLN
INTRAMUSCULAR | Status: DC | PRN
  Administered 2023-12-15 (×2): 25 ug via INTRAVENOUS

## 2023-12-15 MED ORDER — ONDANSETRON HCL 4 MG/2ML IJ SOLN
4.0000 mg | Freq: Four times a day (QID) | INTRAMUSCULAR | Status: DC | PRN
Start: 2023-12-15 — End: 2023-12-16

## 2023-12-15 MED ORDER — CLOPIDOGREL BISULFATE 300 MG PO TABS
ORAL_TABLET | ORAL | Status: DC | PRN
  Administered 2023-12-15 (×2): 600 mg via ORAL

## 2023-12-15 MED ORDER — LIDOCAINE HCL (PF) 1 % IJ SOLN
INTRAMUSCULAR | Status: DC | PRN
  Administered 2023-12-15: 2 mL via INTRADERMAL
  Administered 2023-12-15: 5 mL via INTRADERMAL

## 2023-12-15 MED ORDER — HEPARIN SODIUM (PORCINE) 1000 UNIT/ML IJ SOLN
INTRAMUSCULAR | Status: DC | PRN
  Administered 2023-12-15 (×2): 3000 [IU] via INTRAVENOUS
  Administered 2023-12-15: 11000 [IU] via INTRAVENOUS

## 2023-12-15 SURGICAL SUPPLY — 21 items
BALLOON EMERGE MR 3.5X8 (BALLOONS) IMPLANT
BALLOON SAPPHIRE NC24 4.0X12 (BALLOONS) IMPLANT
BALLOON SCOREFLEX 3.50X10 (BALLOONS) IMPLANT
CATH INFINITI 5FR MULTPACK ANG (CATHETERS) IMPLANT
CATH INFINITI 6F ANG MULTIPACK (CATHETERS) IMPLANT
CATH INFINITI AMBI 5FR TG (CATHETERS) IMPLANT
CATH LAUNCHER 6FR EBU3.5 (CATHETERS) IMPLANT
DEVICE RAD COMP TR BAND LRG (VASCULAR PRODUCTS) IMPLANT
GLIDESHEATH SLEND A-KIT 6F 22G (SHEATH) IMPLANT
GUIDEWIRE INQWIRE 1.5J.035X260 (WIRE) IMPLANT
GUIDEWIRE PRESSURE X 175 (WIRE) IMPLANT
KIT ENCORE 26 ADVANTAGE (KITS) IMPLANT
KIT HEMO VALVE WATCHDOG (MISCELLANEOUS) IMPLANT
KIT MICROPUNCTURE NIT STIFF (SHEATH) IMPLANT
PACK CARDIAC CATHETERIZATION (CUSTOM PROCEDURE TRAY) ×1 IMPLANT
SET ATX-X65L (MISCELLANEOUS) IMPLANT
SHEATH PINNACLE 5F 10CM (SHEATH) IMPLANT
SHEATH PINNACLE 6F 10CM (SHEATH) IMPLANT
SHEATH PROBE COVER 6X72 (BAG) IMPLANT
STENT SYNERGY XD 3.50X16 (Permanent Stent) IMPLANT
WIRE EMERALD 3MM-J .035X150CM (WIRE) IMPLANT

## 2023-12-15 NOTE — Progress Notes (Signed)
 I was called by procedural short stay to assist in clarification of patient bedrest and discharge time frames. I spoke with Dr. Elmira to inform him of sheath removal. Patient may be discharged home after 4 hour bedrest, ambulation, and after seen by an extender. Verbal order placed. Short stay charge RN informed.Annamaria Salah E

## 2023-12-15 NOTE — Progress Notes (Signed)
   Notified by RN that patient developed a hematoma at femoral cath site. Patient had completed his bed rest, walked to the bathroom and back without difficulty. When RN examined the site after he had walked, she noticed an area of hardness near the femoral site, consistent with a hematoma. She applied pressure. Initially when she applied pressure, patient reported pain at the site. However, pain eased off. After about 15 minutes, hematoma had resolved. When I examined the femoral cath site, area was soft. Some mild bruising noted. Patient denied any pain in his pelvis, low back, or groin.   Of note, when pressure was first applied to the hematoma, BP decreased to 106 systolic. This is lower than his baseline, which seems to be in the 130s-140s. When I evaluated the patient, his BP has improved to 133 systolic. He denies lightheadedness.   Discussed with Dr. Elmira- Hematoma had resolved and as he is hemodynamically stable. He recommended that patient ambulate with staff. If he tolerates well and the hematoma does not reoccur, patient is OK to be discharged home as planned.   Discussed red flag symptoms such as pelvic/low back pain, areas of hardness, worsening pain, swelling, bleeding. Patient voiced understanding and agreement with plan   Rollo FABIENE Louder, PA-C 12/15/2023 7:14 PM

## 2023-12-15 NOTE — Progress Notes (Signed)
 Upon completing bed rest PT ambulated to the bathroom, voided without difficulty. When returned short stay room fem site reassessed, PT was noted to have hematoma and pai present. Pressure was applied and MD made aware. After 18 minutes of applied pressure. Hematoma resolved and pt pain resolved. MD evaluated patient, no new orders were given. PT ambulated after MD evaluation no s/s of complications. PT was dressed. Escorted from the unit via wheel chair to personal vehicle.

## 2023-12-15 NOTE — Progress Notes (Signed)
 Site area: Right fem artery Site Prior to Removal:  Level 0 Pressure Applied For: Manual:   yes Patient Status During Pull:  stable Post Pull Site:  Level 0 Post Pull Instructions Given:  yes with teach back Post Pull Pulses Present: +2-3 DP/PT Dressing Applied:  gauze and tegaderm Bedrest begins @ 1445

## 2023-12-15 NOTE — Interval H&P Note (Signed)
 History and Physical Interval Note:  12/15/2023 9:04 AM  Brent [name]  has presented today for surgery, with the diagnosis of cad - angina.  The various methods of treatment have been discussed with the patient and family. After consideration of risks, benefits and other options for treatment, the patient has consented to  Procedure(s): LEFT HEART CATH AND CORONARY ANGIOGRAPHY (N/A) as a surgical intervention.  The patient's history has been reviewed, patient examined, no change in status, stable for surgery.  I have reviewed the patient's chart and labs.  Questions were answered to the patient's satisfaction.     Kavian Peters J Jaion Lagrange

## 2023-12-15 NOTE — H&P (Signed)
 OV 12/03/2023 copied for documentation   Cardiology Office Note:  .   Date:  12/15/2023  ID:  Brent [name], DOB 1955-09-16, MRN [id number] PCP:  Domenica Harlene LABOR, MD   [facility] Providers Cardiologist:  Oneil Parchment, MD  Electrophysiologist:  None  Click to update primary MD,subspecialty MD or APP then REFRESH:1}    C/C: Chest pain  History of Present Illness: .   Brent [name] is a 68 y.o. Caucasian male whose past medical history and cardiovascular risk factors includes: CAD status post DES to RCA, diabetes mellitus type 2, OSA (on CPAP), GI bleed, HTN, HLD, morbid obesity status post gastric bypass 2012, anemia, IDA.  Patient's primary cardiologist is Dr. Oneil Parchment last seen by [name] in November 2024 (progress note reviewed).  I am seeing him for an acute visit for chest pain and shortness of breath on DOD day.   Patient is accompanied by his wife at today's visit.  Chest pain: Left-sided. Intensity 3 out of 10. Dull ache like sensation. Worse with ambulation, especially going up stairs and overexertion. Improves with sublingual nitroglycerin  tablet Has been taking 1 or 2 sublingual nitroglycerin  tablets daily since July/August 2025 (in addition to his antianginal therapy). For reasons unknown he did not go to the ED for further evaluation and management despite his current symptoms being similar to his anginal equivalents back in 2023. Associated symptoms include difficulty breathing Denies nausea, vomiting, diaphoresis, syncope No active chest pain  Review of Systems: .   Review of Systems  Cardiovascular:  Positive for chest pain and dyspnea on exertion. Negative for claudication, irregular heartbeat, leg swelling, near-syncope, orthopnea, palpitations, paroxysmal nocturnal dyspnea and syncope.  Respiratory:  Positive for shortness of breath.   Hematologic/Lymphatic: Negative for bleeding problem.    Studies Reviewed:   EKG: EKG  Interpretation Date/Time:    Ventricular Rate:    PR Interval:    QRS Duration:    QT Interval:    QTC Calculation:   R Axis:      Text Interpretation:    Echocardiogram: 12/08/2021 1. Left ventricular ejection fraction, by estimation, is 55 to 60%. The  left ventricle has normal function. The left ventricle has no regional  wall motion abnormalities. Left ventricular diastolic parameters are  indeterminate.   2. Right ventricular systolic function is normal. The right ventricular  size is normal. There is normal pulmonary artery systolic pressure. The  estimated right ventricular systolic pressure is 31.5 mmHg.   3. The mitral valve is grossly normal. Mild mitral valve regurgitation.  No evidence of mitral stenosis.   4. The aortic valve is tricuspid. There is mild calcification of the  aortic valve. Aortic valve regurgitation is mild. Aortic valve sclerosis  is present, with no evidence of aortic valve stenosis.   5. The inferior vena cava is normal in size with greater than 50%  respiratory variability, suggesting right atrial pressure of 3 mmHg.   Stress Testing: Cardiac PET/CT 09/2022   Findings are consistent with no ischemia and no infarction. The study is low to intermediate risk due to the presence of mildly reduced myocardial blood flow reserve. Given that perfusion is normal, there is no TID, and EF augments with stress, this is likely due to microvascular disease in the setting of CKD however we cannot rule out multivessel CAD. Recommend clinical correlation.   LV perfusion is normal. There is no evidence of ischemia. There is no evidence of infarction.   Rest left ventricular function  is normal. Rest EF: 49 %. Stress left ventricular function is normal. Stress EF: 59 %. End diastolic cavity size is normal. End systolic cavity size is normal.   Myocardial blood flow was computed to be 0.7ml/g/min at rest and 1.50ml/g/min at stress. Global myocardial blood flow reserve  was 1.70 and was mildly abnormal.   Coronary calcium  assessment not performed due to prior revascularization.   Electronically Signed: Powell Sorrow, MD  RADIOLOGY: NA  Risk Assessment/Calculations:   NA   Labs:       Latest Ref Rng & Units 12/15/2023    8:48 AM 12/03/2023    3:55 PM 07/13/2023   12:55 PM  CBC  WBC 4.0 - 10.5 K/uL 4.8  5.3  5.5   Hemoglobin 13.0 - 17.0 g/dL 89.4  89.1  88.4   Hematocrit 39.0 - 52.0 % 31.9  32.4  35.2   Platelets 150 - 400 K/uL 169  197  181        Latest Ref Rng & Units 12/03/2023    3:55 PM 06/18/2023    8:39 AM 10/02/2022    2:04 PM  BMP  Glucose 70 - 99 mg/dL 895  833  847   BUN 8 - 27 mg/dL 20  21  25    Creatinine 0.76 - 1.27 mg/dL 8.89  8.58  8.71   BUN/Creat Ratio 10 - 24 18     Sodium 134 - 144 mmol/L 139  140  137   Potassium 3.5 - 5.2 mmol/L 4.6  4.6  4.4   Chloride 96 - 106 mmol/L 103  102  104   CO2 20 - 29 mmol/L 20  27  24    Calcium  8.6 - 10.2 mg/dL 9.0  9.3  9.0       Latest Ref Rng & Units 12/03/2023    3:55 PM 06/18/2023    8:39 AM 10/02/2022    2:04 PM  CMP  Glucose 70 - 99 mg/dL 895  833  847   BUN 8 - 27 mg/dL 20  21  25    Creatinine 0.76 - 1.27 mg/dL 8.89  8.58  8.71   Sodium 134 - 144 mmol/L 139  140  137   Potassium 3.5 - 5.2 mmol/L 4.6  4.6  4.4   Chloride 96 - 106 mmol/L 103  102  104   CO2 20 - 29 mmol/L 20  27  24    Calcium  8.6 - 10.2 mg/dL 9.0  9.3  9.0   Total Protein 6.0 - 8.3 g/dL  6.6    Total Bilirubin 0.2 - 1.2 mg/dL  0.6    Alkaline Phos 39 - 117 U/L  31    AST 0 - 37 U/L  17    ALT 0 - 53 U/L  9      Lab Results  Component Value Date   CHOL 119 06/18/2023   HDL 46.50 06/18/2023   LDLCALC 53 06/18/2023   LDLDIRECT 74.0 05/22/2021   TRIG 100.0 06/18/2023   CHOLHDL 3 06/18/2023   No results for input(s): LIPOA in the last 8760 hours. No components found for: NTPROBNP No results for input(s): PROBNP in the last 8760 hours. Recent Labs    06/18/23 0839  TSH 2.08    Physical  Exam:    Today's Vitals   12/15/23 0715 12/15/23 0838 12/15/23 0901  BP: 108/66    Pulse: 69    Resp: 16    Temp: 98.2 F (36.8 C)    SpO2:  97%    Weight: 109.8 kg    Height: 5' 8 (1.727 m)    PainSc:  0-No pain 0-No pain   Body mass index is 36.8 kg/m. Wt Readings from Last 3 Encounters:  12/15/23 109.8 kg  12/03/23 112.9 kg  11/16/23 109.3 kg    Physical Exam  Constitutional: No distress.  hemodynamically stable  Neck: No JVD present.  Cardiovascular: Normal rate, regular rhythm, S1 normal and S2 normal. Exam reveals no gallop, no S3 and no S4.  No murmur heard. Pulmonary/Chest: Effort normal and breath sounds normal. No stridor. He has no wheezes. He has no rales.  Musculoskeletal:        General: No edema.     Cervical back: Neck supple.  Skin: Skin is warm.     Impression & Recommendation(s):  Impression:   ICD-10-CM   1. Research study patient  Z00.6 Basic metabolic panel    CBC    Basic metabolic panel    CBC       Recommendation(s):  Coronary artery disease involving native heart with angina pectoris Symptom onset July/August 2025 No active symptoms. Has been taking 1 to 2 tablets of sublingual nitroglycerin  tablets daily since July/August 2025. Unable to explain why he has not gone to the ED for more expedited evaluation. EKG today is nonischemic Has had cardiac PET/CT in the recent past which noted concern for either macrovascular dysfunction versus multivessel CAD. His current symptoms are similar to his anginal discomfort that he had back in 2023 Patient has multiple cardiovascular risk factors and very high pretest probability for CAD. Discussed undergoing left heart catheterization with possible intervention, patient agrees Risks, benefits, and alternatives discussed with patient and wife. Antianginal therapy: Lopressor  100 mg p.o. twice daily, Imdur  45 mg p.o. twice daily, sublingual nitroglycerin  tablets Will increase Imdur  from 45 mg p.o.  twice daily to 120 mg p.o. every morning Check CBC and BMP prior to heart catheterization Echo will be ordered to evaluate for structural heart disease and left ventricular systolic function. Educated him on seeking medical attention sooner by going to the closest ER via EMS if the symptoms increase in intensity, frequency, duration, or if he uses 2 or more sublingual nitroglycerin  tablets as discussed  in the office.  Patient verbalized understanding. Avoid overexertion until the workup is complete.  Essential hypertension Office blood pressures are well-controlled. Medications reconciled  Obstructive sleep apnea on CPAP Patient endorses compliance with device therapy  Mixed hyperlipidemia Currently on fenofibrate , Zetia , pitavastatin  LDL 53 mg/dL as of February 2025, [facility] evidence  Will update his primary cardiologist Dr. Jeffrie.  Recommend follow-up in 4 weeks after  Medical Decision:  Complexity: High -anginal chest pain Discussed going to the hospital for higher level of care, patient prefers outpatient follow-up for now but if symptoms progress we will go to the closest ER via EMS for further evaluation and management Independently reviewed: Last progress note from [name] 03/08/2023 Prescription drug management: See above Independent historian: Wife Obtain informed consent for left heart catheterization with possible intervention Ordered additional diagnostic workup and labs  Orders Placed:  Orders Placed This Encounter  Procedures   Glucose, capillary    Standing Status:   Standing    Number of Occurrences:   1   Basic metabolic panel    Standing Status:   Standing    Number of Occurrences:   1   CBC    Standing Status:   Standing    Number of Occurrences:  1   Informed Consent Details: Physician/Practitioner Attestation; Transcribe to consent form and obtain patient signature    Standing Status:   Standing    Number of Occurrences:   1     Physician/Practitioner attestation of informed consent for procedure/surgical case:   I, the physician/practitioner, attest that I have discussed with the patient the benefits, risks, side effects, alternatives, likelihood of achieving goals and potential problems during recovery for the procedure that I have provided informed consent.    Procedure:   Left Heart Cath and Coronary Angiography with possible Percutaneous Coronary Intervention    Physician/Practitioner performing the procedure:   Dr Newman Lawrence    Indication/Reason:   coronary artery disease with angina   Apply Cardiac or Vascular Catheterization and/or Intervention Care Plan    Standing Status:   Standing    Number of Occurrences:   1   Confirm CBC and BMP (or CMP) results within 7 days for inpatient and 30 days for outpatient: Outpatients with severe anemia (hgb<10, CKD, severe thrombocytopenia plts<100) labs should be within 10 days. Only draw PT/INR on patients that are on Coumadin, Hgb<10, have liver disease (cirrhosis, liver CA, hepatitis, etc). Urine pregnancy test within hospital admission for inpatients of child bearing age, for outpatients day of procedure.    Standing Status:   Standing    Number of Occurrences:   1   Confirm EKG performed within 30 days for cardiac procedures and 12 months for peripheral vascular procedures.  Place order for EKG if missing or not within timeframe.    Standing Status:   Standing    Number of Occurrences:   1   Verify aspirin  and / or anti-platelet medication (Plavix , Effient, Brilinta) dose available for cardiac / peripheral vascular procedure day. IF ordered daily / once, adjust schedule to administer before procedure.    Standing Status:   Standing    Number of Occurrences:   1   Weigh patient    Standing Status:   Standing    Number of Occurrences:   1   Initiate Cath/PCI clinical path; encourage patient to watch CCTV video    encourage patient to watch CCTV video    Standing  Status:   Standing    Number of Occurrences:   1   Insert peripheral IV    Avoid wrist if possible.    Standing Status:   Standing    Number of Occurrences:   1   Insert 2nd peripheral IV site-Saline lock IV    If scheduled for left and right heart cath or right heart cath, please insert 20 gauge NSL in Rt AC    Standing Status:   Standing    Number of Occurrences:   1     Final Medication List:    Meds ordered this encounter  Medications   aspirin  chewable tablet 81 mg   free water  500 mL   sodium chloride  flush (NS) 0.9 % injection 3 mL   sodium chloride  flush (NS) 0.9 % injection 3 mL   0.9 %  sodium chloride  infusion    Medications Discontinued During This Encounter  Medication Reason   Semaglutide, 1 MG/DOSE, 4 MG/3ML SOPN Patient Preference     Current Facility-Administered Medications:    0.9 %  sodium chloride  infusion, 250 mL, Intravenous, PRN, Tolia, Sunit, DO   aspirin  chewable tablet 81 mg, 81 mg, Oral, Pre-Cath, Tolia, Sunit, DO   free water  500 mL, 500 mL, Oral, Once, Tolia, Sunit, DO  sodium chloride  flush (NS) 0.9 % injection 3 mL, 3 mL, Intravenous, Q12H, Tolia, Sunit, DO   sodium chloride  flush (NS) 0.9 % injection 3 mL, 3 mL, Intravenous, PRN, Tolia, Sunit, DO  Consent:   Informed Consent   Shared Decision Making/Informed Consent The risks [stroke (1 in 1000), death (1 in 1000), kidney failure [usually temporary] (1 in 500), bleeding (1 in 200), allergic reaction [possibly serious] (1 in 200)], benefits (diagnostic support and management of coronary artery disease) and alternatives of a cardiac catheterization were discussed in detail with Brent [name] and he is willing to proceed.     Disposition:   Follow-up with Dr. Jeffrie  His questions and concerns were addressed to his satisfaction. He voices understanding of the recommendations provided during this encounter.    Signed, Madonna Michele HAS, [facility]  A Division of [facility] [address], [place], [place] [address]  [place], [place] [address] 12/15/2023 9:02 AM

## 2023-12-15 NOTE — Progress Notes (Signed)
 Discussed with pt in holding area stent, restrictions, Plavix  importance, diet, exercise as able, NTG and CRPII. Pt receptive. Highly encouraged CRPII since his knees limit him with longer ambulation. Will refer to G[facility].  [id number] Brent [name] BS, ACSM-CEP 12/15/2023 2:55 PM

## 2023-12-15 NOTE — Research (Signed)
 Prevail Informed Consent   Subject Name: Brent [name] [name]  Subject met inclusion and exclusion criteria.  The informed consent form, study requirements and expectations were reviewed with the subject and questions and concerns were addressed prior to the signing of the consent form.  The subject verbalized understanding of the trial requirements.  The subject agreed to participate in the Prevail trial and signed the informed consent at 0820 on 12-15-2023.  The informed consent was obtained prior to performance of any protocol-specific procedures for the subject.  A copy of the signed informed consent was given to the subject and a copy was placed in the subject's medical record.   Teghan Philbin

## 2023-12-15 NOTE — Discharge Summary (Signed)
 Discharge Summary for Same Day PCI   Patient ID: Brent [name] MRN: [id number]; DOB: 1955/06/29  Admit date: 12/15/2023 Discharge date: 12/15/2023  Primary Care Provider: Domenica Harlene LABOR, MD  Primary Cardiologist: Oneil Parchment, MD  Primary Electrophysiologist:  None   Discharge Diagnoses    Active Problems:   Essential hypertension   CAD (coronary artery disease)   Hyperlipidemia, mixed   Hematochezia   Anemia due to chronic blood loss   Low back pain   GI bleed   Research study patient    Diagnostic Studies/Procedures    Cardiac Catheterization 12/15/2023:  Coronary angiography & intervention 12/15/2023: LM: Normal LAD: Prox 30%, followed by mid 60% calcific stenosis. Distal 30% disease          Resting Pd/Pa 0.89, FFR 0.74, with gradient across mid LAD stenosis          Diag 1 40% disease Lcx: Mid to distal 70% disease         RFR 0.96 (physiologically non-significant) RCA: Large, dominant vessel          Proximal 30% disease         Patent proximal/mid stent with no restenosis   LVEDP 15 mmHg   Coronary physiology testing (RFR, resting Pd/Pa, FFR, CFR, IMR) Successful percutaneous coronary intervention mid LAD        PTCA and stent placement 3.5 X 16 mm Synergy drug-eluting stent, deployed at 16 atm        Post dilatation using 4.0 X 12 mm [place] balloon up to 18 atm        0% residual stenosis at stented segment        Resting Pd/Pa 0.89, FFR 0.74        Post PCI resting Pd/Pa 0.96    _____________   History of Present Illness     Brent [name] is a 68 y.o. male with a past medical history with CAD s/p DES to RCA, T2DM, OSA on CPAP, hx of GI bleed, hypertension, hyperlipidemia, morbid obesity s/p gastric bypass in 2012, and iron  deficiency anemia who presented on 8/15 for an acute visit with the DOD for exertional chest pain with associated shortness of breath. Patient reported relief with SL nitroglycerin  which he had been using daily for a month. These  symptoms are similar to his prior anginal events. Given concern for progressive CAD his imdur  was increased and cardiac catheterization was arranged for further evaluation.  Hospital Course     The patient underwent cardiac cath as noted above with successful PCI with DES to the mid LAD. Plan for DAPT with ASA/plavix  for at least 6 months. Access was originally through the right radial, but unable to advance sheath. Site was without evidence of hematoma or other complication. Femoral access was then obtained for LHC.  The patient was seen by cardiac rehab while in short stay. There were no observed complications post cath. Radial cath site was re-evaluated prior to discharge and found to be stable without any complications. Femoral cath site was re-evaluated prior to discharge and found to be stable without any complications.Instructions/precautions regarding cath site care were given prior to discharge. Family member expressed concerns about patient's history of GI bleed and new medication of plavix . Informed patient to monitor symptoms and assess for any bright red/dark stools. He is on high-dose PPI.   Brent [name] was seen by Dr. [place] and determined stable for discharge home. Follow up with our office has been arranged. Medications  are listed below. Pertinent changes include plavix  for 6 months.    _____________  Cath/PCI Registry Performance & Quality Measures: Aspirin  prescribed? - Yes ADP Receptor Inhibitor (Plavix /Clopidogrel , Brilinta/Ticagrelor or Effient/Prasugrel) prescribed (includes medically managed patients)? - Yes High Intensity Statin (Lipitor 40-80mg  or Crestor  20-40mg ) prescribed? - No - could not tolerate; on pitavastatin , zetia , and fenofibrate  For EF <40%, was ACEI/ARB prescribed? - No - Reason:  on cozaar  for hypertension For EF <40%, Aldosterone Antagonist (Spironolactone or Eplerenone) prescribed? - Not Applicable (EF >/= 40%) Cardiac Rehab Phase II ordered  (Included Medically managed Patients)? - Yes  _____________   Discharge Vitals Blood pressure (!) 148/94, pulse 69, temperature 98.2 F (36.8 C), resp. rate 14, height 5' 8 (1.727 m), weight 109.8 kg, SpO2 98%.  Filed Weights   12/15/23 0715  Weight: 109.8 kg    Last Labs & Radiologic Studies    CBC Recent Labs    12/15/23 0848  WBC 4.8  HGB 10.5*  HCT 31.9*  MCV 90.1  PLT 169   Basic Metabolic Panel Recent Labs    91/72/74 0848  NA 138  K 4.4  CL 108  CO2 25  GLUCOSE 125*  BUN 19  CREATININE 1.28*  CALCIUM  8.9   Liver Function Tests No results for input(s): AST, ALT, ALKPHOS, BILITOT, PROT, ALBUMIN in the last 72 hours. No results for input(s): LIPASE, AMYLASE in the last 72 hours. High Sensitivity Troponin:   No results for input(s): TROPONINIHS in the last 720 hours.  BNP Invalid input(s): POCBNP D-Dimer No results for input(s): DDIMER in the last 72 hours. Hemoglobin A1C No results for input(s): HGBA1C in the last 72 hours. Fasting Lipid Panel No results for input(s): CHOL, HDL, LDLCALC, TRIG, CHOLHDL, LDLDIRECT in the last 72 hours. Thyroid  Function Tests No results for input(s): TSH, T4TOTAL, T3FREE, THYROIDAB in the last 72 hours.  Invalid input(s): FREET3 _____________  CARDIAC CATHETERIZATION Result Date: 12/15/2023 Images from the original result were not included. Coronary angiography & intervention 12/15/2023: LM: Normal LAD: Prox 30%, followed by mid 60% calcific stenosis. Distal 30% disease          Resting Pd/Pa 0.89, FFR 0.74, with gradient across mid LAD stenosis          Diag 1 40% disease Lcx: Mid to distal 70% disease         RFR 0.96 (physiologically non-significant) RCA: Large, dominant vessel          Proximal 30% disease         Patent proximal/mid stent with no restenosis LVEDP 15 mmHg Coronary physiology testing (RFR, resting Pd/Pa, FFR, CFR, IMR) Successful percutaneous coronary  intervention mid LAD        PTCA and stent placement 3.5 X 16 mm Synergy drug-eluting stent, deployed at 16 atm        Post dilatation using 4.0 X 12 mm [place] balloon up to 18 atm        0% residual stenosis at stented segment        Resting Pd/Pa 0.89, FFR 0.74        Post PCI resting Pd/Pa 0.96 Manish J Patwardhan, MD   Disposition   Pt is being discharged home today in good condition.  Follow-up Plans & Appointments     Discharge Instructions     Amb Referral to Cardiac Rehabilitation   Complete by: As directed    Diagnosis:  Coronary Stents PTCA     After initial evaluation and assessments completed: Virtual  Based Care may be provided alone or in conjunction with Phase 2 Cardiac Rehab based on patient barriers.: Yes   Intensive Cardiac Rehabilitation (ICR) [facility] location only OR Traditional Cardiac Rehabilitation (TCR) *If criteria for ICR are not met will enroll in TCR [facility] only): Yes        Discharge Medications   Allergies as of 12/15/2023       Reactions   Bee Venom Anaphylaxis   Lipitor [atorvastatin ] Other (See Comments)   Myalgias, memory changes   Morphine  Other (See Comments)   hyperactive   Metformin  And Related Other (See Comments)   Myalgias and weakness   Lioresal  [baclofen ] Other (See Comments)   Acted drunk   Medrol  [methylprednisolone ] Other (See Comments)   Hyperglycemia    Zocor  [simvastatin ] Other (See Comments)   Joint pain   Hydrocodone  Itching   Latex Rash   Pravachol  [pravastatin ] Other (See Comments)   Joint pain        Medication List     TAKE these medications    acetaminophen  500 MG tablet Commonly known as: TYLENOL  Take 1,000 mg by mouth every 6 (six) hours as needed for moderate pain or headache.   amitriptyline  50 MG tablet Commonly known as: ELAVIL  Take 1 tablet (50 mg total) by mouth at bedtime.   amoxicillin  500 MG capsule Commonly known as: AMOXIL  Take 2,000 mg by mouth See admin instructions. Take 2,000 mg prior to  dental appointments   aspirin  EC 81 MG tablet Take 1 tablet (81 mg total) by mouth daily. Swallow whole.   clopidogrel  75 MG tablet Commonly known as: PLAVIX  Take 1 tablet (75 mg total) by mouth daily with breakfast. Start taking on: December 16, 2023   docusate sodium  100 MG capsule Commonly known as: COLACE Take 100 mg by mouth daily as needed for mild constipation.   EPINEPHrine  0.3 mg/0.3 mL Soaj injection Commonly known as: EPI-PEN INJECT 0.3 MG (0.3 ML) INTO THE MUSCLE ONCE FOR 1 DOSE AS NEEDED FOR ANAPHYLAXIS AS DIRECTED   ezetimibe  10 MG tablet Commonly known as: ZETIA  TAKE 1 TABLET DAILY   famotidine  40 MG tablet Commonly known as: PEPCID  TAKE 1 TABLET AT BEDTIME AS NEEDED FOR HEARTBURN OR INDIGESTION   fenofibrate  160 MG tablet TAKE 1 TABLET DAILY   fluticasone  50 MCG/ACT nasal spray Commonly known as: FLONASE  Place 2 sprays into both nostrils daily.   gabapentin  300 MG capsule Commonly known as: NEURONTIN  Take 300 mg by mouth 3 (three) times daily. Pt takes 3 tablets 900 mg in the morning, 2 tablets 600 mg in the afternoon, and 3 tablets 900 mg at night/   hydrochlorothiazide  12.5 MG capsule Commonly known as: MICROZIDE  TAKE 1 CAPSULE DAILY   isosorbide  mononitrate 120 MG 24 hr tablet Commonly known as: IMDUR  Take 1 tablet (120 mg total) by mouth daily.   Lexapro 20 MG tablet Generic drug: escitalopram Take 1 tablet (20 mg total) by mouth daily.   losartan  50 MG tablet Commonly known as: COZAAR  Take 1 tablet (50 mg total) by mouth daily.   Magnesium  100 MG Caps Take 1 capsule (100 mg total) by mouth daily.   methocarbamol  500 MG tablet Commonly known as: ROBAXIN  TAKE 1 TABLET FOUR TIMES A DAY   metoprolol  tartrate 100 MG tablet Commonly known as: LOPRESSOR  TAKE 1 TABLET TWICE A DAY   mometasone  0.1 % cream Commonly known as: Elocon  Apply 1 Application topically daily. What changed:  when to take this reasons to take this  multivitamin  tablet Take 1 tablet by mouth daily.   nitroGLYCERIN  0.4 MG SL tablet Commonly known as: Nitrostat  Place 1 tablet (0.4 mg total) under the tongue every 5 (five) minutes as needed.   NON FORMULARY Pt uses a cpap nightly   oxycodone  5 MG capsule Commonly known as: OXY-IR Take 5 mg by mouth in the morning, at noon, in the evening, and at bedtime.   Ozempic (2 MG/DOSE) 8 MG/3ML Sopn Generic drug: Semaglutide (2 MG/DOSE) Inject 2 mg into the skin once a week.   pantoprazole  40 MG tablet Commonly known as: PROTONIX  Take 1 tablet (40 mg total) by mouth 2 (two) times daily before a meal.   Pitavastatin  Calcium  2 MG Tabs TAKE 1 TABLET DAILY   prazosin 1 MG capsule Commonly known as: MINIPRESS Take 1 mg by mouth at bedtime.   Vitamin B-12 1000 MCG Subl Place 1,000 mcg under the tongue 2 (two) times a week.   Vitamin D  50 MCG (2000 UT) tablet Take 6,000 Units by mouth daily.           Allergies Allergies  Allergen Reactions   Bee Venom Anaphylaxis   Lipitor [Atorvastatin ] Other (See Comments)    Myalgias, memory changes   Morphine  Other (See Comments)    hyperactive   Metformin  And Related Other (See Comments)    Myalgias and weakness   Lioresal  [Baclofen ] Other (See Comments)    Acted drunk   Medrol  [Methylprednisolone ] Other (See Comments)    Hyperglycemia    Zocor  [Simvastatin ] Other (See Comments)    Joint pain   Hydrocodone  Itching   Latex Rash   Pravachol  [Pravastatin ] Other (See Comments)    Joint pain    Outstanding Labs/Studies   None  Duration of Discharge Encounter   Greater than 30 minutes including physician time.  Signed, Leontine LOISE Salen, PA-C 12/15/2023, 5:38 PM

## 2023-12-15 NOTE — Progress Notes (Signed)
 TR band deflated. Awaiting last step, which is taking band off. ACT checked, awaiting value to determine pull or not.

## 2023-12-16 ENCOUNTER — Encounter (HOSPITAL_COMMUNITY): Payer: Self-pay | Admitting: Cardiology

## 2023-12-21 ENCOUNTER — Telehealth (HOSPITAL_COMMUNITY): Payer: Self-pay

## 2023-12-21 NOTE — Telephone Encounter (Signed)
 Attempted to call patient in regards to Cardiac Rehab - LM on VM

## 2023-12-24 ENCOUNTER — Ambulatory Visit (HOSPITAL_COMMUNITY)
Admission: RE | Admit: 2023-12-24 | Discharge: 2023-12-24 | Disposition: A | Discharge status: Home / Self Care | Source: Ambulatory Visit | Attending: Cardiology | Admitting: Cardiology

## 2023-12-24 DIAGNOSIS — I25118 Atherosclerotic heart disease of native coronary artery with other forms of angina pectoris: Secondary | ICD-10-CM | POA: Diagnosis not present

## 2023-12-24 DIAGNOSIS — I10 Essential (primary) hypertension: Secondary | ICD-10-CM | POA: Diagnosis not present

## 2023-12-24 LAB — ECHOCARDIOGRAM COMPLETE
AR max vel: 2.06 cm2
AV Area VTI: 2.02 cm2
AV Area mean vel: 2.17 cm2
AV Mean grad: 5 mmHg
AV Peak grad: 9.9 mmHg
AV Vena cont: 0.19 cm
Ao pk vel: 1.57 m/s
Area-P 1/2: 3.99 cm2
MV M vel: 3.21 m/s
MV Peak grad: 41.2 mmHg
P 1/2 time: 737 ms
S' Lateral: 2.53 cm

## 2023-12-29 ENCOUNTER — Ambulatory Visit: Admitting: Physician Assistant

## 2024-01-02 ENCOUNTER — Encounter: Payer: Self-pay | Admitting: Cardiology

## 2024-01-02 NOTE — Assessment & Plan Note (Signed)
 Supplement and monitor

## 2024-01-02 NOTE — Assessment & Plan Note (Signed)
hydrate and monitor  

## 2024-01-02 NOTE — Assessment & Plan Note (Signed)
 Recent after cardiac cath, repeat CMP today and hydrate well

## 2024-01-02 NOTE — Assessment & Plan Note (Signed)
 [id number] acceptable, minimize simple carbs. Increase exercise as tolerated. Continue current meds

## 2024-01-02 NOTE — Assessment & Plan Note (Signed)
 Underwent recent cardiac cath with cardiology

## 2024-01-03 ENCOUNTER — Other Ambulatory Visit: Payer: Self-pay

## 2024-01-03 DIAGNOSIS — I10 Essential (primary) hypertension: Secondary | ICD-10-CM

## 2024-01-03 MED ORDER — CLOPIDOGREL BISULFATE 75 MG PO TABS: 75.0000 mg | ORAL_TABLET | Freq: Every day | ORAL | 2 refills | Status: AC

## 2024-01-03 MED ORDER — ISOSORBIDE MONONITRATE ER 120 MG PO TB24: 120.0000 mg | ORAL_TABLET | Freq: Every day | ORAL | 3 refills | Status: DC

## 2024-01-03 NOTE — Progress Notes (Unsigned)
 Patient requested isosorbide  mononitrate and clopidogrel  to [facility].

## 2024-01-06 ENCOUNTER — Ambulatory Visit (INDEPENDENT_AMBULATORY_CARE_PROVIDER_SITE_OTHER): Payer: Medicare Other | Admitting: Family Medicine

## 2024-01-06 VITALS — BP 117/71 | HR 67 | Temp 98.4°F | Resp 16 | Ht 68.0 in | Wt 252.0 lb

## 2024-01-06 DIAGNOSIS — E1100 Type 2 diabetes mellitus with hyperosmolarity without nonketotic hyperglycemic-hyperosmolar coma (NKHHC): Secondary | ICD-10-CM

## 2024-01-06 DIAGNOSIS — E538 Deficiency of other specified B group vitamins: Secondary | ICD-10-CM | POA: Diagnosis not present

## 2024-01-06 DIAGNOSIS — M1A9XX0 Chronic gout, unspecified, without tophus (tophi): Secondary | ICD-10-CM | POA: Diagnosis not present

## 2024-01-06 DIAGNOSIS — E782 Mixed hyperlipidemia: Secondary | ICD-10-CM | POA: Diagnosis not present

## 2024-01-06 DIAGNOSIS — N179 Acute kidney failure, unspecified: Secondary | ICD-10-CM

## 2024-01-06 DIAGNOSIS — I10 Essential (primary) hypertension: Secondary | ICD-10-CM

## 2024-01-06 DIAGNOSIS — E559 Vitamin D deficiency, unspecified: Secondary | ICD-10-CM

## 2024-01-06 DIAGNOSIS — D500 Iron deficiency anemia secondary to blood loss (chronic): Secondary | ICD-10-CM | POA: Diagnosis not present

## 2024-01-06 DIAGNOSIS — I2510 Atherosclerotic heart disease of native coronary artery without angina pectoris: Secondary | ICD-10-CM

## 2024-01-06 NOTE — Patient Instructions (Signed)

## 2024-01-07 ENCOUNTER — Encounter: Payer: Self-pay | Admitting: Family Medicine

## 2024-01-07 ENCOUNTER — Ambulatory Visit: Payer: Self-pay | Admitting: Family Medicine

## 2024-01-07 DIAGNOSIS — M1A9XX0 Chronic gout, unspecified, without tophus (tophi): Secondary | ICD-10-CM

## 2024-01-07 DIAGNOSIS — E782 Mixed hyperlipidemia: Secondary | ICD-10-CM

## 2024-01-07 LAB — CBC WITH DIFFERENTIAL/PLATELET
Basophils Absolute: 0 K/uL (ref 0.0–0.1)
Basophils Relative: 0.8 % (ref 0.0–3.0)
Eosinophils Absolute: 0.2 K/uL (ref 0.0–0.7)
Eosinophils Relative: 3.6 % (ref 0.0–5.0)
HCT: 33.2 % — ABNORMAL LOW (ref 39.0–52.0)
Hemoglobin: 11.1 g/dL — ABNORMAL LOW (ref 13.0–17.0)
Lymphocytes Relative: 28.8 % (ref 12.0–46.0)
Lymphs Abs: 1.5 K/uL (ref 0.7–4.0)
MCHC: 33.4 g/dL (ref 30.0–36.0)
MCV: 89.4 fl (ref 78.0–100.0)
Monocytes Absolute: 0.5 K/uL (ref 0.1–1.0)
Monocytes Relative: 10.5 % (ref 3.0–12.0)
Neutro Abs: 2.8 K/uL (ref 1.4–7.7)
Neutrophils Relative %: 56.3 % (ref 43.0–77.0)
Platelets: 218 K/uL (ref 150.0–400.0)
RBC: 3.71 Mil/uL — ABNORMAL LOW (ref 4.22–5.81)
RDW: 13.6 % (ref 11.5–15.5)
WBC: 5 K/uL (ref 4.0–10.5)

## 2024-01-07 LAB — COMPREHENSIVE METABOLIC PANEL WITH GFR
ALT: 13 U/L (ref 0–53)
AST: 20 U/L (ref 0–37)
Albumin: 4.8 g/dL (ref 3.5–5.2)
Alkaline Phosphatase: 54 U/L (ref 39–117)
BUN: 26 mg/dL — ABNORMAL HIGH (ref 6–23)
CO2: 27 meq/L (ref 19–32)
Calcium: 9.5 mg/dL (ref 8.4–10.5)
Chloride: 104 meq/L (ref 96–112)
Creatinine, Ser: 1.41 mg/dL (ref 0.40–1.50)
GFR: 51.3 mL/min — ABNORMAL LOW (ref >60.00)
Glucose, Bld: 96 mg/dL (ref 70–99)
Potassium: 4.9 meq/L (ref 3.5–5.1)
Sodium: 139 meq/L (ref 135–145)
Total Bilirubin: 0.6 mg/dL (ref 0.2–1.2)
Total Protein: 6.7 g/dL (ref 6.0–8.3)

## 2024-01-07 LAB — LIPID PANEL
Cholesterol: 139 mg/dL (ref 0–200)
HDL: 35.2 mg/dL — ABNORMAL LOW (ref >39.00)
LDL Cholesterol: 55 mg/dL (ref 0–99)
NonHDL: 103.49
Total CHOL/HDL Ratio: 4
Triglycerides: 241 mg/dL — ABNORMAL HIGH (ref 0.0–149.0)
VLDL: 48.2 mg/dL — ABNORMAL HIGH (ref 0.0–40.0)

## 2024-01-07 LAB — IRON,TIBC AND FERRITIN PANEL
%SAT: 25 % (ref 20–48)
Ferritin: 74 ng/mL (ref 24–380)
Iron: 72 ug/dL (ref 50–180)
TIBC: 289 ug/dL (ref 250–425)

## 2024-01-07 LAB — HEMOGLOBIN A1C: Hgb A1c MFr Bld: 6.5 % (ref 4.6–6.5)

## 2024-01-07 LAB — VITAMIN D 25 HYDROXY (VIT D DEFICIENCY, FRACTURES): VITD: 33.71 ng/mL (ref 30.00–100.00)

## 2024-01-07 LAB — TSH: TSH: 1.84 u[IU]/mL (ref 0.35–5.50)

## 2024-01-07 LAB — VITAMIN B12: Vitamin B-12: 675 pg/mL (ref 211–911)

## 2024-01-07 LAB — URIC ACID: Uric Acid, Serum: 9.7 mg/dL — ABNORMAL HIGH (ref 4.0–7.8)

## 2024-01-07 MED ORDER — ALLOPURINOL 100 MG PO TABS: 100.0000 mg | ORAL_TABLET | Freq: Every day | ORAL | 1 refills | Status: DC

## 2024-01-07 MED ORDER — PITAVASTATIN CALCIUM 4 MG PO TABS: 4.0000 mg | ORAL_TABLET | Freq: Every day | ORAL | 1 refills | Status: AC

## 2024-01-07 NOTE — Progress Notes (Signed)
 Subjective:    Patient ID: Brent [name], male    DOB: 1955/06/13, 68 y.o.   MRN: [id number]  Chief Complaint  Patient presents with   Hypertension    Here for follow up   Diabetes    Here for follow up    HPI Discussed the use of AI scribe software for clinical note transcription with the patient, who gave verbal consent to proceed.  History of Present Illness Brent [name] is a 68 year old male with coronary artery disease who presents for follow-up after recent stent placement.  He recently underwent a stent placement due to symptoms similar to those experienced in 2022, including shortness of breath and chest pain, particularly when climbing stairs. The procedure was more challenging this time as it was performed through the groin rather than the wrist, which had been used in previous procedures. Post-procedure, he has experienced increased energy and improved breathing.  Prior to the stent placement, he experienced chest pain and pressure, especially on exertion, and difficulty breathing. He was using nitroglycerin  sublingually one to two times a day despite being on long-acting nitroglycerin . A flow test revealed a kink in the artery, necessitating the stent placement.  He is currently on Plavix , though the duration of this treatment has not been specified. He is also on Ozempic 2 mg for diabetes management, prescribed by his endocrinologist at [facility]. His A1c is a concern for his endocrinologist.  He has a history of anemia, which was monitored during his recent cardiac catheterization. He is scheduled for a follow-up with hematology to manage his blood and iron  levels. No recent issues with gout.  He is a Runner, broadcasting/film/video and mentions challenges in attending cardiac rehab due to work hours.  No recent sickness, chest pain, or stomach issues. No new pain or issues with his feet.    Past Medical History:  Diagnosis Date   Acid reflux disease    ACID REFLUX DISEASE  07/05/2007   Acute gastric ulcer with bleeding    Anemia    Arthritis    Atherosclerosis    Benign neoplasm of colon 07/05/2007   Bilateral hip pain 10/05/2016   Bladder cancer (HCC)    Breast pain, left 11/17/2011   CAD (coronary artery disease)    Chest pain    a. Reportedly negative dobut echo performed prior to gastric bypass in 03/2011;  b. 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 12/2011 Cath: LM nl, LAD 50p, D1 66m, LCX min irregs, OM3 30, RCA 25p, 27m (FFR 0.99->0.89), PDA 30, EF 65%, Med Rx. d. Cath 11/2021 - 90% m RCA s/p PCI/DES x1   COLONIC POLYPS, HX OF 10/29/2008   Diarrhea 06/13/2010   Diverticulosis 07/05/2018   DIVERTICULOSIS, COLON 10/29/2008   DM (diabetes mellitus), type 2, uncontrolled    GI bleed    Gout 03/04/2013   Hearing loss 05/24/2013   Previous audiology evaluation completely.   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   Neck pain 03/2015   Other and unspecified hyperlipidemia 11/16/2012   Post-operative nausea and vomiting    after the surgery in hospital in March 2020/ past the cauderization   Preventative  health care 11/17/2011   Raynaud's disease    Sleep apnea    a. CPAP   Spinal stenosis    Tear of meniscus of left knee 2012    Past Surgical History:  Procedure Laterality Date   ANKLE SURGERY Right 1994   BICEPS TENDON REPAIR     left side   CARDIAC CATHETERIZATION     denies any chest pain in the past 2 years   COLONOSCOPY     colonoscopy polyps     CORONARY PRESSURE/FFR WITH 3D MAPPING N/A 12/15/2023   Procedure: Coronary Pressure/FFR w/3D Mapping;  Surgeon: Elmira Newman PARAS, MD;  Location: [facility];  Service: Cardiovascular;  Laterality: N/A;    CORONARY STENT INTERVENTION N/A 11/28/2021   Procedure: CORONARY STENT INTERVENTION;  Surgeon: Swaziland, Peter M, MD;  Location: [facility] INVASIVE CV LAB;  Service: Cardiovascular;  Laterality: N/A;   CORONARY STENT INTERVENTION N/A 12/15/2023   Procedure: CORONARY STENT INTERVENTION;  Surgeon: Elmira Newman PARAS, MD;  Location: [facility];  Service: Cardiovascular;  Laterality: N/A;   CYSTOSCOPY WITH BIOPSY Bilateral 10/06/2022   Procedure: CYSTOSCOPY WITH BIOPSY FULGURATION BILATERAL RETROGRADE PYELOGRAM INSTILL GEMCITABINE ;  Surgeon: Nieves Cough, MD;  Location: [facility];  Service: Urology;  Laterality: Bilateral;  1 HR FOR CASE   ESOPHAGOGASTRODUODENOSCOPY N/A 03/27/2013   Procedure: ESOPHAGOGASTRODUODENOSCOPY (EGD);  Surgeon: Norleen LOISE Kiang, MD;  Location: [facility];  Service: Endoscopy;  Laterality: N/A;   ESOPHAGOGASTRODUODENOSCOPY (EGD) WITH PROPOFOL  N/A 07/06/2018   Procedure: ESOPHAGOGASTRODUODENOSCOPY (EGD) WITH PROPOFOL ;  Surgeon: Albertus Gordy HERO, MD;  Location: [facility];  Service: Gastroenterology;  Laterality: N/A;   ESOPHAGOGASTRODUODENOSCOPY (EGD) WITH PROPOFOL  N/A 02/05/2022   Procedure: ESOPHAGOGASTRODUODENOSCOPY (EGD) WITH PROPOFOL ;  Surgeon: San Sandor GAILS, DO;  Location: [facility];  Service: Gastroenterology;  Laterality: N/A;   GASTRIC BYPASS     HEMOSTASIS CLIP PLACEMENT  02/05/2022   Procedure: HEMOSTASIS CLIP PLACEMENT;  Surgeon: San Sandor GAILS, DO;  Location: [facility];  Service: Gastroenterology;;   HERNIA REPAIR     HOT HEMOSTASIS N/A 07/06/2018   Procedure: HOT HEMOSTASIS (ARGON PLASMA COAGULATION/BICAP);  Surgeon: Albertus Gordy HERO, MD;  Location: [facility];  Service: Gastroenterology;  Laterality: N/A;   KNEE ARTHROSCOPY Left 11/06/2010   Left, torn meniscus (repaired)   LEFT HEART CATH AND CORONARY ANGIOGRAPHY N/A 11/28/2021   Procedure: LEFT HEART CATH AND CORONARY ANGIOGRAPHY;  Surgeon: Swaziland, Peter M, MD;  Location: [facility] INVASIVE CV LAB;   Service: Cardiovascular;  Laterality: N/A;   LEFT HEART CATH AND CORONARY ANGIOGRAPHY N/A 12/15/2023   Procedure: LEFT HEART CATH AND CORONARY ANGIOGRAPHY;  Surgeon: Elmira Newman PARAS, MD;  Location: [facility];  Service: Cardiovascular;  Laterality: N/A;   LEFT HEART CATHETERIZATION WITH CORONARY ANGIOGRAM N/A 12/23/2011   Procedure: LEFT HEART CATHETERIZATION WITH CORONARY ANGIOGRAM;  Surgeon: Lynwood Schilling, MD;  Location: [facility] CATH LAB;  Service: Cardiovascular;  Laterality: N/A;   REPLACEMENT TOTAL KNEE Right 01/2016   removed scar tissue/ cut tip of nerve bundle and re-route   right knee arthroscopy Right 07/05/14   Dr. Prentice Collet, [facility].   ROTATOR CUFF REPAIR  2019   left   SCHLEROTHERAPY  07/06/2018   Procedure: SCHLEROTHERAPY;  Surgeon: Albertus Gordy HERO, MD;  Location: [facility];  Service: Gastroenterology;;   MATIAS  02/05/2022   Procedure: MATIAS;  Surgeon: San Sandor GAILS, DO;  Location: [facility];  Service: Gastroenterology;;   TONSILLECTOMY  age 69   TONSILLECTOMY  as a child   TOTAL KNEE ARTHROPLASTY Right 01/20/2016   Procedure: RIGHT TOTAL KNEE ARTHROPLASTY;  Surgeon: Donnice Car, MD;  Location: [facility];  Service: Orthopedics;  Laterality: Right;   TRANSURETHRAL RESECTION OF BLADDER TUMOR N/A 09/24/2020   Procedure: TRANSURETHRAL RESECTION OF BLADDER TUMOR (TURBT)  RIGHT retrograde pylegram;  Surgeon: Nieves Donnice, MD;  Location: [facility];  Service: Urology;  Laterality: N/A;   UPPER GI ENDOSCOPY  03/27/13    Family History  Problem Relation Age of Onset   Diabetes Mother    Hypertension Mother    Stroke Mother    Hyperlipidemia Mother    Hypertension Father    Colon polyps Father    Heart attack Father 75   Stroke Father    Heart attack Brother    Diabetes Brother    Heart disease Brother    Heart attack Brother        Multiple   Diabetes Brother    Diabetes Sister    Stroke Sister    Obesity Brother    Diabetes Brother     Heart disease Brother    Hypertension Maternal Grandmother    ADD / ADHD Daughter    Heart disease Brother    Stomach cancer Neg Hx    Colon cancer Neg Hx    Esophageal cancer Neg Hx    Rectal cancer Neg Hx     Social History   Socioeconomic History   Marital status: Married    Spouse name: Not on file   Number of children: 2   Years of education: Not on file   Highest education level: Professional school degree (e.g., MD, DDS, DVM, JD)  Occupational History   Occupation: TEACHER    Employer: [employer]  Tobacco Use   Smoking status: Former    Current packs/day: 0.00    Average packs/day: 1.5 packs/day for 20.0 years (30.0 ttl pk-yrs)    Types: Cigarettes    Start date: 04/21/1971    Quit date: 04/21/1991    Years since quitting: 32.7   Smokeless tobacco: Never  Vaping Use   Vaping status: Never Used  Substance and Sexual Activity   Alcohol use: Yes    Alcohol/week: 4.0 standard drinks of alcohol    Types: 4 Cans of beer per week    Comment: occas   Drug use: No   Sexual activity: Not Currently  Other Topics Concern   Not on file  Social History Narrative   Lives with wife in [place].  Does not routinely exercise.   Social Drivers of Corporate investment banker Strain: Low Risk  (12/30/2023)   Overall Financial Resource Strain (CARDIA)    Difficulty of Paying Living Expenses: Not hard at all  Food Insecurity: No Food Insecurity (12/30/2023)   Hunger Vital Sign    Worried About Running Out of Food in the Last Year: Never true    Ran Out of Food in the Last Year: Never true  Transportation Needs: No Transportation Needs (12/30/2023)   PRAPARE - Administrator, Civil Service (Medical): No    Lack of Transportation (Non-Medical): No  Physical Activity: Insufficiently Active (12/30/2023)   Exercise Vital Sign    Days of Exercise per Week: 2 days    Minutes of Exercise per Session: 20 min  Stress: No Stress Concern Present (12/30/2023)    [employer] of Occupational Health - Occupational Stress Questionnaire    Feeling of Stress: Only a little  Social Connections:  Unknown (12/30/2023)   Social Connection and Isolation Panel    Frequency of Communication with Friends and Family: More than three times a week    Frequency of Social Gatherings with Friends and Family: Twice a week    Attends Religious Services: More than 4 times per year    Active Member of [employer] or Organizations: Not on file    Attends Banker Meetings: Not on file    Marital Status: Married  Intimate Partner Violence: Not At Risk (11/16/2023)   Humiliation, Afraid, Rape, and Kick questionnaire    Fear of Current or Ex-Partner: No    Emotionally Abused: No    Physically Abused: No    Sexually Abused: No    Outpatient Medications Prior to Visit  Medication Sig Dispense Refill   acetaminophen  (TYLENOL ) 500 MG tablet Take 1,000 mg by mouth every 6 (six) hours as needed for moderate pain or headache.     amitriptyline  (ELAVIL ) 50 MG tablet Take 1 tablet (50 mg total) by mouth at bedtime. 90 tablet 2   amoxicillin  (AMOXIL ) 500 MG capsule Take 2,000 mg by mouth See admin instructions. Take 2,000 mg prior to dental appointments     aspirin  EC 81 MG tablet Take 1 tablet (81 mg total) by mouth daily. Swallow whole. 30 tablet 12   Cholecalciferol (VITAMIN D ) 50 MCG (2000 UT) tablet Take 6,000 Units by mouth daily.     clopidogrel  (PLAVIX ) 75 MG tablet Take 1 tablet (75 mg total) by mouth daily with breakfast. 90 tablet 2   Cyanocobalamin  (VITAMIN B-12) 1000 MCG SUBL Place 1,000 mcg under the tongue 2 (two) times a week.     docusate sodium  (COLACE) 100 MG capsule Take 100 mg by mouth daily as needed for mild constipation.     EPINEPHRINE  0.3 mg/0.3 mL IJ SOAJ injection INJECT 0.3 MG (0.3 ML) INTO THE MUSCLE ONCE FOR 1 DOSE AS NEEDED FOR ANAPHYLAXIS AS DIRECTED 2 each 11   escitalopram (LEXAPRO) 20 MG tablet Take 1 tablet (20 mg total) by mouth daily.      ezetimibe  (ZETIA ) 10 MG tablet TAKE 1 TABLET DAILY 90 tablet 1   famotidine  (PEPCID ) 40 MG tablet TAKE 1 TABLET AT BEDTIME AS NEEDED FOR HEARTBURN OR INDIGESTION 90 tablet 3   fenofibrate  160 MG tablet TAKE 1 TABLET DAILY 90 tablet 0   fluticasone  (FLONASE ) 50 MCG/ACT nasal spray Place 2 sprays into both nostrils daily. 48 g 1   gabapentin  (NEURONTIN ) 300 MG capsule Take 300 mg by mouth 3 (three) times daily. Pt takes 3 tablets 900 mg in the morning, 2 tablets 600 mg in the afternoon, and 3 tablets 900 mg at night/     hydrochlorothiazide  (MICROZIDE ) 12.5 MG capsule TAKE 1 CAPSULE DAILY 90 capsule 1   isosorbide  mononitrate (IMDUR ) 120 MG 24 hr tablet Take 1 tablet (120 mg total) by mouth daily. 30 tablet 3   losartan  (COZAAR ) 50 MG tablet Take 1 tablet (50 mg total) by mouth daily. 90 tablet 1   Magnesium  100 MG CAPS Take 1 capsule (100 mg total) by mouth daily. 30 capsule 1   methocarbamol  (ROBAXIN ) 500 MG tablet TAKE 1 TABLET FOUR TIMES A DAY 360 tablet 3   metoprolol  tartrate (LOPRESSOR ) 100 MG tablet TAKE 1 TABLET TWICE A DAY 180 tablet 2   mometasone  (ELOCON ) 0.1 % cream Apply 1 Application topically daily. (Patient taking differently: Apply 1 Application topically daily as needed (irritation).) 90 g 1   Multiple Vitamin (  MULTIVITAMIN) tablet Take 1 tablet by mouth daily.     nitroGLYCERIN  (NITROSTAT ) 0.4 MG SL tablet Place 1 tablet (0.4 mg total) under the tongue every 5 (five) minutes as needed. 25 tablet 3   NON FORMULARY Pt uses a cpap nightly     oxycodone  (OXY-IR) 5 MG capsule Take 5 mg by mouth in the morning, at noon, in the evening, and at bedtime.     pantoprazole  (PROTONIX ) 40 MG tablet Take 1 tablet (40 mg total) by mouth 2 (two) times daily before a meal. 180 tablet 1   Pitavastatin  Calcium  2 MG TABS TAKE 1 TABLET DAILY 90 tablet 3   prazosin (MINIPRESS) 1 MG capsule Take 1 mg by mouth at bedtime.     Semaglutide, 2 MG/DOSE, (OZEMPIC, 2 MG/DOSE,) 8 MG/3ML SOPN Inject 2 mg  into the skin once a week.     No facility-administered medications prior to visit.    Allergies  Allergen Reactions   Bee Venom Anaphylaxis   Lipitor [Atorvastatin ] Other (See Comments)    Myalgias, memory changes   Morphine  Other (See Comments)    hyperactive   Metformin  And Related Other (See Comments)    Myalgias and weakness   Lioresal  [Baclofen ] Other (See Comments)    Acted drunk   Medrol  [place] ] Other (See Comments)    Hyperglycemia    Zocor  [Simvastatin ] Other (See Comments)    Joint pain   Hydrocodone  Itching   Latex Rash   Pravachol  [Pravastatin ] Other (See Comments)    Joint pain    Review of Systems  Constitutional:  Negative for fever and malaise/fatigue.  HENT:  Negative for congestion.   Eyes:  Negative for blurred vision.  Respiratory:  Positive for shortness of breath.   Cardiovascular:  Negative for chest pain, palpitations and leg swelling.  Gastrointestinal:  Negative for abdominal pain, blood in stool and nausea.  Genitourinary:  Negative for dysuria and frequency.  Musculoskeletal:  Negative for falls.  Skin:  Negative for rash.  Neurological:  Negative for dizziness, loss of consciousness and headaches.  Endo/Heme/Allergies:  Negative for environmental allergies.  Psychiatric/Behavioral:  Negative for depression. The patient is not nervous/anxious.        Objective:    Physical Exam Vitals reviewed.  Constitutional:      Appearance: Normal appearance. He is not ill-appearing.  HENT:     Head: Normocephalic and atraumatic.     Nose: Nose normal.  Eyes:     Conjunctiva/sclera: Conjunctivae normal.  Cardiovascular:     Rate and Rhythm: Normal rate.     Pulses: Normal pulses.     Heart sounds: Normal heart sounds. No murmur heard. Pulmonary:     Effort: Pulmonary effort is normal.     Breath sounds: Normal breath sounds. No wheezing.  Abdominal:     Palpations: Abdomen is soft. There is no mass.     Tenderness: There is no  abdominal tenderness.  Musculoskeletal:     Cervical back: Normal range of motion.     Right lower leg: No edema.     Left lower leg: No edema.  Skin:    General: Skin is warm and dry.  Neurological:     General: No focal deficit present.     Mental Status: He is alert and oriented to person, place, and time.  Psychiatric:        Mood and Affect: Mood normal.     BP 117/71 (BP Location: Left Arm, Patient Position: Sitting, Cuff Size:  Normal)   Pulse 67   Temp 98.4 F (36.9 C) (Oral)   Resp 16   Ht 5' 8 (1.727 m)   Wt 252 lb (114.3 kg)   SpO2 98%   BMI 38.32 kg/m  Wt Readings from Last 3 Encounters:  01/06/24 252 lb (114.3 kg)  12/15/23 242 lb (109.8 kg)  12/03/23 249 lb (112.9 kg)    Diabetic Foot Exam - Simple   Simple Foot Form Diabetic Foot exam was performed with the following findings: Yes 01/06/2024  1:47 PM  Visual Inspection No deformities, no ulcerations, no other skin breakdown bilaterally: Yes Sensation Testing Intact to touch and monofilament testing bilaterally: Yes Pulse Check Posterior Tibialis and Dorsalis pulse intact bilaterally: Yes Comments    Lab Results  Component Value Date   WBC 4.8 12/15/2023   HGB 10.5 (L) 12/15/2023   HCT 31.9 (L) 12/15/2023   PLT 169 12/15/2023   GLUCOSE 125 (H) 12/15/2023   CHOL 119 06/18/2023   TRIG 100.0 06/18/2023   HDL 46.50 06/18/2023   LDLDIRECT 74.0 05/22/2021   LDLCALC 53 06/18/2023   ALT 9 06/18/2023   AST 17 06/18/2023   NA 138 12/15/2023   K 4.4 12/15/2023   CL 108 12/15/2023   CREATININE 1.28 (H) 12/15/2023   BUN 19 12/15/2023   CO2 25 12/15/2023   TSH 2.08 06/18/2023   PSA 1.55 06/25/2017   INR 1.1 02/04/2022   HGBA1C 6.9 (H) 06/18/2023   MICROALBUR 0.3 05/18/2006    Lab Results  Component Value Date   TSH 2.08 06/18/2023   Lab Results  Component Value Date   WBC 4.8 12/15/2023   HGB 10.5 (L) 12/15/2023   HCT 31.9 (L) 12/15/2023   MCV 90.1 12/15/2023   PLT 169 12/15/2023   Lab  Results  Component Value Date   NA 138 12/15/2023   K 4.4 12/15/2023   CO2 25 12/15/2023   GLUCOSE 125 (H) 12/15/2023   BUN 19 12/15/2023   CREATININE 1.28 (H) 12/15/2023   BILITOT 0.6 06/18/2023   ALKPHOS 31 (L) 06/18/2023   AST 17 06/18/2023   ALT 9 06/18/2023   PROT 6.6 06/18/2023   ALBUMIN 4.7 06/18/2023   CALCIUM  8.9 12/15/2023   ANIONGAP 5 12/15/2023   EGFR 73 12/03/2023   GFR 51.50 (L) 06/18/2023   Lab Results  Component Value Date   CHOL 119 06/18/2023   Lab Results  Component Value Date   HDL 46.50 06/18/2023   Lab Results  Component Value Date   LDLCALC 53 06/18/2023   Lab Results  Component Value Date   TRIG 100.0 06/18/2023   Lab Results  Component Value Date   CHOLHDL 3 06/18/2023   Lab Results  Component Value Date   HGBA1C 6.9 (H) 06/18/2023       Assessment & Plan:  Coronary artery disease involving native coronary artery of native heart without angina pectoris Assessment & Plan: Underwent recent cardiac cath with cardiology   AKI (acute kidney injury) (HCC) Assessment & Plan: Recent after cardiac cath, repeat CMP today and hydrate well   Chronic gout without tophus, unspecified cause, unspecified site Assessment & Plan: hydrate and monitor   Orders: -     Uric acid  Type 2 diabetes mellitus with hyperosmolarity without coma, without long-term current use of insulin  (HCC) Assessment & Plan: hgba1c acceptable, minimize simple carbs. Increase exercise as tolerated. Continue current meds  Orders: -     Hemoglobin A1c  Vitamin B12 deficiency Assessment &  Plan: Supplement and monitor  Orders: -     Vitamin B12  Vitamin D  deficiency Assessment & Plan: Supplement and monitor   Orders: -     VITAMIN D  25 Hydroxy (Vit-D Deficiency, Fractures)  Iron  deficiency anemia due to chronic blood loss -     CBC with Differential/Platelet -     Iron , TIBC and Ferritin Panel  Hyperlipidemia, mixed -     Lipid panel -      Comprehensive metabolic panel with GFR  Essential hypertension -     TSH    Assessment and Plan Assessment & Plan Atherosclerotic heart disease with recent stent placement Recent stent placement in the left coronary artery due to 60% blockage and a kink causing exertional chest pain and dyspnea. Post-procedure, experiencing improved energy and breathing. Currently on Plavix , with follow-up with cardiologist scheduled to determine duration of Plavix  therapy. - Continue Plavix  therapy until cardiologist follow-up. - Encourage regular physical activity aiming for 7,000 steps daily to improve cardiovascular health. - Advise on regular hydration and balanced diet to support overall health.  Iron  deficiency anemia Recent anemia noted post-cardiac catheterization. Follow-up with hematology scheduled. Plan to check current iron  status to avoid repeated blood draws. - Order CBC and iron  panel today to assess current anemia status. - Coordinate with hematology to avoid duplicate testing.  Type 2 diabetes mellitus Currently managed with Ozempic 2 mg for glycemic control. Last A1c was 6.5% in May. No recent eye exam or foot exam by endocrinologist. - Perform foot exam today. - Order A1c and regular blood panels including kidney function tests. - Check urine for microalbumin to monitor for diabetic nephropathy. - Encourage regular follow-up with endocrinologist for diabetes management. - Discuss potential benefits of nutrition consult for dietary management.  General Health Maintenance Discussion on the importance of regular physical activity, hydration, and a balanced diet. Immunizations for pneumonia, RSV, and COVID discussed. - Plan for COVID and flu vaccinations in the upcoming month.  Recording duration: 34 minutes     Harlene Horton, MD

## 2024-01-10 ENCOUNTER — Other Ambulatory Visit: Payer: Self-pay | Admitting: Family Medicine

## 2024-01-11 ENCOUNTER — Other Ambulatory Visit: Payer: Self-pay

## 2024-01-11 DIAGNOSIS — I10 Essential (primary) hypertension: Secondary | ICD-10-CM

## 2024-01-13 ENCOUNTER — Inpatient Hospital Stay: Attending: Medical Oncology | Admitting: Medical Oncology

## 2024-01-13 ENCOUNTER — Inpatient Hospital Stay

## 2024-01-13 VITALS — HR 71 | Temp 98.0°F | Resp 18 | Ht 68.0 in | Wt 252.0 lb

## 2024-01-13 DIAGNOSIS — D508 Other iron deficiency anemias: Secondary | ICD-10-CM | POA: Insufficient documentation

## 2024-01-13 DIAGNOSIS — K909 Intestinal malabsorption, unspecified: Secondary | ICD-10-CM | POA: Insufficient documentation

## 2024-01-13 DIAGNOSIS — C679 Malignant neoplasm of bladder, unspecified: Secondary | ICD-10-CM | POA: Insufficient documentation

## 2024-01-13 DIAGNOSIS — R7989 Other specified abnormal findings of blood chemistry: Secondary | ICD-10-CM | POA: Diagnosis not present

## 2024-01-13 DIAGNOSIS — N182 Chronic kidney disease, stage 2 (mild): Secondary | ICD-10-CM | POA: Diagnosis not present

## 2024-01-13 DIAGNOSIS — D500 Iron deficiency anemia secondary to blood loss (chronic): Secondary | ICD-10-CM | POA: Diagnosis not present

## 2024-01-13 DIAGNOSIS — Z9884 Bariatric surgery status: Secondary | ICD-10-CM | POA: Insufficient documentation

## 2024-01-13 NOTE — Progress Notes (Signed)
 Hematology and Oncology Follow Up Visit  ALAN DRUMMER [id number] [birth year] 68 y.o. 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 Kraai is here today for follow-up.   Today he states that he has been doing well. He has no concerns today. Fatigue is stable.   He continues to be followed by Urology for his bladder cancer. Kidney function is stable.   No blood loss noted. No abnormal bruising, no petechiae.  No fever, chills, n/v, cough, rash, dizziness, SOB, chest pain, palpitations, abdominal pain or changes in bowel or bladder habits.  No swelling or tenderness in his extremities.  No falls or syncope.  Appetite and hydration are good.  Wt Readings from Last 3 Encounters:  01/13/24 252 lb (114.3 kg)  01/06/24 252 lb (114.3 kg)  12/15/23 242 lb (109.8 kg)   ECOG Performance Status: 1 - Symptomatic but completely ambulatory  Medications:  Allergies as of 01/13/2024       Reactions   Bee Venom Anaphylaxis   Lipitor [atorvastatin ] Other (See Comments)   Myalgias, memory changes   Morphine  Other (See Comments)   hyperactive   Metformin  And Related Other (See Comments)   Myalgias and weakness   Lioresal  [baclofen ] Other (See Comments)   Acted drunk   Medrol  [methylprednisolone ] Other (See Comments)   Hyperglycemia    Zocor  [simvastatin ] Other (See Comments)   Joint pain   Hydrocodone  Itching   Latex Rash   Pravachol  [pravastatin ] Other (See Comments)   Joint pain        Medication List        Accurate as of January 13, 2024  2:59 PM. If you have any questions, ask your nurse or doctor.          acetaminophen  500 MG tablet Commonly known as:  TYLENOL  Take 1,000 mg by mouth every 6 (six) hours as needed for moderate pain or headache.   allopurinol  100 MG tablet Commonly known as: ZYLOPRIM  Take 1 tablet (100 mg total) by mouth daily.   amitriptyline  50 MG tablet Commonly known as: ELAVIL  Take 1 tablet (50 mg total) by mouth at bedtime.   amoxicillin  500 MG capsule Commonly known as: AMOXIL  Take 2,000 mg by mouth See admin instructions. Take 2,000 mg prior to dental appointments   aspirin  EC 81 MG tablet Take 1 tablet (81 mg total) by mouth daily. Swallow whole.   clopidogrel  75 MG tablet Commonly known as: PLAVIX  Take 1 tablet (75 mg total) by mouth daily with breakfast.   docusate sodium  100 MG capsule Commonly known as: COLACE Take 100 mg by mouth daily as needed for mild constipation.   EPINEPHrine  0.3 mg/0.3 mL Soaj injection Commonly known as: EPI-PEN INJECT 0.3 MG (0.3 ML) INTO THE MUSCLE ONCE FOR 1 DOSE AS NEEDED FOR ANAPHYLAXIS AS DIRECTED   ezetimibe  10 MG tablet Commonly known as: ZETIA  TAKE 1 TABLET DAILY   famotidine  40 MG tablet Commonly known as: PEPCID  TAKE 1 TABLET AT BEDTIME AS NEEDED FOR HEARTBURN OR INDIGESTION   fenofibrate  160 MG tablet TAKE 1 TABLET DAILY   fluticasone  50 MCG/ACT nasal spray Commonly known as: FLONASE  Place 2 sprays into both nostrils daily.   gabapentin   300 MG capsule Commonly known as: NEURONTIN  Take 300 mg by mouth 3 (three) times daily. Pt takes 3 tablets 900 mg in the morning, 2 tablets 600 mg in the afternoon, and 3 tablets 900 mg at night/   hydrochlorothiazide  12.5 MG capsule Commonly known as: MICROZIDE  TAKE 1 CAPSULE DAILY   isosorbide  mononitrate 120 MG 24 hr tablet Commonly known as: IMDUR  Take 1 tablet (120 mg total) by mouth daily.   Lexapro 20 MG tablet Generic drug: escitalopram Take 1 tablet (20 mg total) by mouth daily.   losartan  50 MG tablet Commonly known as: COZAAR  Take 1 tablet (50 mg total) by mouth daily.   Magnesium  100 MG  Caps Take 1 capsule (100 mg total) by mouth daily.   methocarbamol  500 MG tablet Commonly known as: ROBAXIN  TAKE 1 TABLET FOUR TIMES A DAY   metoprolol  tartrate 100 MG tablet Commonly known as: LOPRESSOR  TAKE 1 TABLET TWICE A DAY   mometasone  0.1 % cream Commonly known as: Elocon  Apply 1 Application topically daily. What changed:  when to take this reasons to take this   multivitamin tablet Take 1 tablet by mouth daily.   nitroGLYCERIN  0.4 MG SL tablet Commonly known as: Nitrostat  Place 1 tablet (0.4 mg total) under the tongue every 5 (five) minutes as needed.   NON FORMULARY Pt uses a cpap nightly   oxycodone  5 MG capsule Commonly known as: OXY-IR Take 5 mg by mouth in the morning, at noon, in the evening, and at bedtime.   Ozempic (2 MG/DOSE) 8 MG/3ML Sopn Generic drug: Semaglutide (2 MG/DOSE) Inject 2 mg into the skin once a week.   pantoprazole  40 MG tablet Commonly known as: PROTONIX  Take 1 tablet (40 mg total) by mouth 2 (two) times daily before a meal.   Pitavastatin  Calcium  4 MG Tabs Take 1 tablet (4 mg total) by mouth daily.   prazosin 1 MG capsule Commonly known as: MINIPRESS Take 1 mg by mouth at bedtime.   Vitamin B-12 1000 MCG Subl Place 1,000 mcg under the tongue 2 (two) times a week.   Vitamin D  50 MCG (2000 UT) tablet Take 6,000 Units by mouth daily.        Allergies:  Allergies  Allergen Reactions   Bee Venom Anaphylaxis   Lipitor [Atorvastatin ] Other (See Comments)    Myalgias, memory changes   Morphine  Other (See Comments)    hyperactive   Metformin  And Related Other (See Comments)    Myalgias and weakness   Lioresal  [Baclofen ] Other (See Comments)    Acted drunk   Medrol  [Methylprednisolone ] Other (See Comments)    Hyperglycemia    Zocor  [Simvastatin ] Other (See Comments)    Joint pain   Hydrocodone  Itching   Latex Rash   Pravachol  [Pravastatin ] Other (See Comments)    Joint pain    Past Medical History, Surgical  history, Social history, and Family History were reviewed and updated.  Review of Systems: All other 10 point review of systems is negative.   Physical Exam:  height is 5' 8 (1.727 m) and weight is 252 lb (114.3 kg). His oral temperature is 98 F (36.7 C). His pulse is 71. His respiration is 18 and oxygen saturation is 100%.   Wt Readings from Last 3 Encounters:  01/13/24 252 lb (114.3 kg)  01/06/24 252 lb (114.3 kg)  12/15/23 242 lb (109.8 kg)   General: AAx3.  Ocular: Sclerae unicteric, pupils equal, round and reactive to light Ear-nose-throat: Oropharynx clear, dentition fair Lymphatic:  No cervical or supraclavicular adenopathy Lungs no rales or rhonchi, good excursion bilaterally Heart regular rate and rhythm, no murmur appreciated Neuro: non-focal, well-oriented, appropriate affect  Lab Results  Component Value Date   WBC 5.0 01/06/2024   HGB 11.1 (L) 01/06/2024   HCT 33.2 (L) 01/06/2024   MCV 89.4 01/06/2024   PLT 218.0 01/06/2024   Lab Results  Component Value Date   FERRITIN 74 01/06/2024   IRON  72 01/06/2024   TIBC 289 01/06/2024   UIBC 348 07/13/2023   IRONPCTSAT 25 01/06/2024   Lab Results  Component Value Date   RETICCTPCT 1.5 05/04/2022   RBC 3.71 (L) 01/06/2024   No results found for: KPAFRELGTCHN, LAMBDASER, KAPLAMBRATIO No results found for: IGGSERUM, IGA, IGMSERUM No results found for: STEPHANY CARLOTA BENSON MARKEL EARLA JOANNIE DOC VICK, SPEI   Chemistry      Component Value Date/Time   NA 139 01/06/2024 1347   NA 139 12/03/2023 1555   K 4.9 01/06/2024 1347   CL 104 01/06/2024 1347   CO2 27 01/06/2024 1347   BUN 26 (H) 01/06/2024 1347   BUN 20 12/03/2023 1555   CREATININE 1.41 01/06/2024 1347   CREATININE 1.19 11/18/2021 1131   CREATININE 1.19 01/11/2020 1543      Component Value Date/Time   CALCIUM  9.5 01/06/2024 1347   ALKPHOS 54 01/06/2024 1347   AST 20 01/06/2024 1347   AST 21 11/18/2021  1131   ALT 13 01/06/2024 1347   ALT 15 11/18/2021 1131   BILITOT 0.6 01/06/2024 1347   BILITOT 0.3 03/23/2022 0733   BILITOT 0.7 11/18/2021 1131     No diagnosis found.   Impression and Plan: Mr. Tramell is a very pleasant 68 yo caucasian gentleman with iron  deficiency anemia secondary to malabsorption (gastric bypass) as well intermittent GI blood loss with ulcer and bladder cancer. Question if he also may have some mild anemia due to his mild CKD.   Hgb is relatively stable at 11.5 (previous value was 11.8 on 03/03/2023). Recent labs show no iron /B12 or folate deficiency. Question if he may have mild anemia due to his CKD. Discussed that if his Hgb trends downward to 10 consistently we may want to screen to see if he would be a candidate for EPO agents.  Iron  studies are pending. Will replace if needed  RTC 6 months APP, labs (CBC, iron , ferritin, erythro, retic)   Lauraine CHRISTELLA Dais, PA-C 9/25/20252:59 PM

## 2024-01-14 ENCOUNTER — Ambulatory Visit: Attending: Physician Assistant | Admitting: Physician Assistant

## 2024-01-14 VITALS — BP 118/64 | HR 90 | Ht 68.0 in | Wt 255.0 lb

## 2024-01-14 DIAGNOSIS — E782 Mixed hyperlipidemia: Secondary | ICD-10-CM

## 2024-01-14 DIAGNOSIS — I25118 Atherosclerotic heart disease of native coronary artery with other forms of angina pectoris: Secondary | ICD-10-CM

## 2024-01-14 DIAGNOSIS — R0609 Other forms of dyspnea: Secondary | ICD-10-CM | POA: Diagnosis not present

## 2024-01-14 DIAGNOSIS — G4733 Obstructive sleep apnea (adult) (pediatric): Secondary | ICD-10-CM

## 2024-01-14 DIAGNOSIS — I10 Essential (primary) hypertension: Secondary | ICD-10-CM | POA: Diagnosis not present

## 2024-01-14 DIAGNOSIS — Z955 Presence of coronary angioplasty implant and graft: Secondary | ICD-10-CM | POA: Diagnosis not present

## 2024-01-14 MED ORDER — ALLOPURINOL 100 MG PO TABS: 100.0000 mg | ORAL_TABLET | Freq: Every day | ORAL | 1 refills | Status: DC

## 2024-01-14 NOTE — Progress Notes (Signed)
 Cardiology Office Note:  .   Date:  01/14/2024  ID:  [name], DOB Mar 22, 1956, MRN [id number] PCP: Domenica Harlene LABOR, MD  [facility] Providers Cardiologist:  Oneil Parchment, MD {  History of Present Illness: .   Brent [name] is a 68 y.o. male with a past medical history of CAD status post DES to RCA, diabetes mellitus type 2, OSA (on CPAP), GI bleed, HTN, HLD, morbid obesity status post gastric bypass 2012, anemia, IDA who presents today for follow-up appointment.  Was last seen 07/13/2022.  History includes initial visit in 2013 for ED workup complaining of chest pain.  Patient endorsed 7 out of 10 left upper chest pain and shoulder aching.  Underwent LHC which revealed diffuse nonobstructive CAD by FFR with moderate stenosis in the mid RCA but not flow-limiting.  Placed on optimize GDMT.  Had a Lexiscan  in 2016 which was low risk with a normal LVEF.  Developed palpitations in 2021 and wore an event monitor that revealed sinus rhythm and no evidence of AF or pauses.  Underwent Lexi scan 07/2021 that was low risk with normal EF  Was seen by Almarie Crate, NP 11/24/2021 with complaint of worsening SOB and with exertion and extreme fatigue.  LHC was recommended and patient underwent cath that revealed single-vessel obstructive LAD in the mid RCA with successful PCI/DES x 1.  Placed on DAPT with ASA and Plavix  x 6 months.  2D echo was completed showing EF 55 to 60% with mild AI and mild MR. Seen 12/11/2021 with ongoing dyspnea and chest discomfort.  Reported resolution with nitro at that time.  Presented to the ED 01/07/2022 after starting chest discomfort which did not improve with 4 doses of nitro.  EKG and chest x-ray completed with normal troponins.  Patient is followed by cardiology who recommended increasing Imdur  to 60 mg daily.  He is continue Plavix  2/24 and was only on ASA alone 81 mg.  When he was last seen in March 2024 he had been experiencing some stable angina which was  occurring over the last few weeks.  Last episode occurred 3 days prior and was relieved with nitroglycerin .  Denied crushing chest pain or increased palpitations with these episodes.  Compliant with current medications.  Blood pressure well-controlled.  Heart rate 73 bpm.  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  Stated he was not exercising due to severe arthritis.  He denied palpitations, dyspnea, PND, orthopnea, nausea, vomiting, dizziness, syncope, edema, weight gain, early satiety.  I saw him November of last year, he presents with a history of chest pain, has been managing his symptoms with nitroglycerin  and isosorbide . The chest pain typically starts around midday and is relieved with one nitroglycerin  pill. Over the past week, the patient has needed to take nitroglycerin  daily, but has not experienced any chest pain since Friday. The patient is currently taking two 30mg  tablets of isosorbide  daily, one in the morning and one in the evening.  In addition to the chest pain, the patient has been diagnosed with PTSD and chronic pain. He is currently on a variety of medications for these conditions, including an SSRI (Lexapro), gabapentin , and prednisone. The patient also has sleep apnea, which is managed with a sleep apnea machine. 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  Reports no shortness of breath nor dyspnea on exertion. Reports no chest pain, pressure, or tightness. No edema,  orthopnea, PND. Reports no palpitations.    Discussed the use of AI scribe software for clinical note transcription with the patient, who gave verbal consent to proceed.  He saw Dr. Michele 12/03/2023 and was having left-sided chest pain which was rated a 3 out of 10 and like a dull achy sensation.  Has been taking 1-2 nitro glycerin tabs daily since July/August of this year.  He is also on antianginal therapy.  Given the concern for  progressive CAD Imdur  was increased and cardiac catheterization was arranged for further evaluation.  Underwent cardiac catheterization with several PCI with DES to mid LAD.  Plan for DAPT aspirin /Plavix  for at least 6 months.  Initially right radial access was attempted but ultimately femoral access was obtained for LHC.  No complications are documented.  Today, he presents with a hx of coronary artery disease who presents for follow-up after stent placement.  He underwent stent placement in the left anterior descending artery due to significant blockage. Cardiac catheterization, CT scan, and fractional flow reserve confirmed the need for intervention. He has additional blockages in the right coronary artery, circumflex, and LAD branches, managed medically. No further chest pain or nitroglycerin  use since the procedure.  He is on dual antiplatelet therapy with aspirin  and Plavix . Pravastatin  dose was increased to 4 mg due to elevated cholesterol. Recent labs show LDL at 55 mg/dL and triglycerides at 758 mg/dL.  A previous stent was placed in August 2023 in the right coronary artery. During the recent procedure, radial artery access complications required a switch to femoral access, causing internal bleeding and a significant bruise, now healing.  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 He works as a Runner, broadcasting/film/video, requiring prolonged standing. He has sleep apnea and uses a CPAP machine regularly. His father underwent bypass surgery in the late 1970s or early 1980s.  Reports no shortness of breath nor dyspnea on exertion. Reports no chest pain, pressure, or tightness. No edema, orthopnea, PND. Reports no palpitations.   Discussed the use of AI scribe software for clinical note transcription with the patient, who gave verbal consent to proceed.  ROS: Pertinent ROS in HPI  Studies Reviewed: .       Cardiac Catheterization 12/15/2023:   Coronary angiography &  intervention 12/15/2023: LM: Normal LAD: Prox 30%, followed by mid 60% calcific stenosis. Distal 30% disease          Resting Pd/Pa 0.89, FFR 0.74, with gradient across mid LAD stenosis          Diag 1 40% disease Lcx: Mid to distal 70% disease         RFR 0.96 (physiologically non-significant) RCA: Large, dominant vessel          Proximal 30% disease         Patent proximal/mid stent with no restenosis   LVEDP 15 mmHg   Coronary physiology testing (RFR, resting Pd/Pa, FFR, CFR, IMR) Successful percutaneous coronary intervention mid LAD        PTCA and stent placement 3.5 X 16 mm Synergy drug-eluting stent, deployed at 16 atm        Post dilatation using 4.0 X 12 mm  balloon up to 18 atm        0% residual stenosis at stented segment        Resting Pd/Pa 0.89, FFR 0.74        Post PCI resting Pd/Pa 0.96    _____________    NM PET  cardiac 09/29/22  Findings are consistent with no ischemia and no infarction. The study is low to intermediate risk due to the presence of mildly reduced myocardial blood flow reserve. Given that perfusion is normal, there is no TID, and EF augments with stress, this is likely due to microvascular disease in the setting of CKD however we cannot rule out multivessel CAD. Recommend clinical correlation.   LV perfusion is normal. There is no evidence of ischemia. There is no evidence of infarction.   Rest left ventricular function is normal. Rest EF: 49 %. Stress left ventricular function is normal. Stress EF: 59 %. End diastolic cavity size is normal. End systolic cavity size is normal.   Myocardial blood flow was computed to be 0.41ml/g/min at rest and 1.50ml/g/min at stress. Global myocardial blood flow reserve was 1.70 and was mildly abnormal.   Coronary calcium  assessment not performed due to prior revascularization.   Electronically Signed: Powell Sorrow, MD       Physical Exam:   VS:  BP 118/64 (BP Location: Left Arm, Patient Position: Sitting,  Cuff Size: Large)   Pulse 90   Ht 5' 8 (1.727 m)   Wt 255 lb (115.7 kg)   SpO2 96%   BMI 38.77 kg/m    Wt Readings from Last 3 Encounters:  01/14/24 255 lb (115.7 kg)  01/13/24 252 lb (114.3 kg)  01/06/24 252 lb (114.3 kg)    GEN: Well nourished, well developed in no acute distress NECK: No JVD; No carotid bruits CARDIAC: RRR, no murmurs, rubs, gallops RESPIRATORY:  Clear to auscultation without rales, wheezing or rhonchi  ABDOMEN: Soft, non-tender, non-distended EXTREMITIES:  No edema; No deformity   ASSESSMENT AND PLAN: .    Atherosclerotic heart disease of native coronary artery with prior coronary stents and other forms of angina pectoris Recent cardiac catheterization showed a stent in the LAD with additional stenosis: 30% in RCA, 40% in circumflex, 40% in LAD branch. Managed medically. On dual antiplatelet therapy. No recent chest pain or nitroglycerin  use. Heart rate slightly elevated. Second stent placed, first in August 2023. Discussed catheterization approach and post-procedure management. - Continue dual antiplatelet therapy with aspirin  and Plavix . - Monitor heart rate and blood pressure. - Encourage lifestyle modifications to maintain LDL levels. - Advise on self-directed cardiac rehabilitation exercises due to work schedule constraints.  Mixed hyperlipidemia On pitavastatin , increased to 4 mg due to elevated cholesterol. LDL at 55, triglycerides at 241. Recent labs on lower dose. - Order repeat lipid panel in December, three months after dose adjustment. - Advise fasting before lab work.  Essential hypertension Blood pressure well-controlled on current regimen. - Continue current antihypertensive regimen.  Obstructive sleep apnea Using CPAP regularly with good compliance. - Continue CPAP therapy.  Sleep Apnea Well controlled with CPAP machine. -Continue CPAP use.  PTSD Recently started on Escitalopram (Lexapro) 10mg  daily by the [place]. -Continue Escitalopram  10mg  daily.   Dispo: He can follow-up in 3 months or sooner if needed.  Signed, Orren LOISE Fabry, PA-C

## 2024-01-14 NOTE — Patient Instructions (Addendum)
 Thank you for choosing [facility]!     Medication Instructions:  No medication changes were made during today's visit.  *If you need a refill on your cardiac medications before your next appointment, please call your pharmacy*   Lab Work: Return in 3 months for fasting lab work...........................SABRA LIPID, LFT's If you have labs (blood work) drawn today and your tests are completely normal, you will receive your results only by: MyChart Message (if you have MyChart) OR A paper copy in the mail If you have any lab test that is abnormal or we need to change your treatment, we will call you to review the results.   Testing/Procedures: No procedures were ordered during today's visit.   Your next appointment:   3 months   Provider:   Oneil Parchment, MD or available APP    Follow-Up: At [facility], you and your health needs are our priority.  As part of our continuing mission to provide you with exceptional heart care, we have created designated Provider Care Teams.  These Care Teams include your primary Cardiologist (physician) and Advanced Practice Providers (APPs -  Physician Assistants and Nurse Practitioners) who all work together to provide you with the care you need, when you need it. We recommend signing up for the patient portal called MyChart.  Sign up information is provided on this After Visit Summary.  MyChart is used to connect with patients for Virtual Visits (Telemedicine).  Patients are able to view lab/test results, encounter notes, upcoming appointments, etc.  Non-urgent messages can be sent to your provider as well.   To learn more about what you can do with MyChart, go to [email or web address].

## 2024-01-17 ENCOUNTER — Other Ambulatory Visit: Payer: Self-pay | Admitting: Cardiology

## 2024-01-17 DIAGNOSIS — I10 Essential (primary) hypertension: Secondary | ICD-10-CM

## 2024-01-17 MED ORDER — ISOSORBIDE MONONITRATE ER 120 MG PO TB24: 120.0000 mg | ORAL_TABLET | Freq: Every day | ORAL | 3 refills | Status: DC

## 2024-02-11 ENCOUNTER — Other Ambulatory Visit: Payer: Self-pay

## 2024-02-11 DIAGNOSIS — I10 Essential (primary) hypertension: Secondary | ICD-10-CM

## 2024-02-11 DIAGNOSIS — Z8551 Personal history of malignant neoplasm of bladder: Secondary | ICD-10-CM | POA: Diagnosis not present

## 2024-02-14 MED ORDER — ISOSORBIDE MONONITRATE ER 120 MG PO TB24: 120.0000 mg | ORAL_TABLET | Freq: Every day | ORAL | 3 refills | Status: AC

## 2024-02-25 ENCOUNTER — Other Ambulatory Visit: Payer: Self-pay | Admitting: Cardiology

## 2024-03-02 ENCOUNTER — Other Ambulatory Visit: Payer: Self-pay

## 2024-03-03 ENCOUNTER — Other Ambulatory Visit: Payer: Self-pay | Admitting: Nurse Practitioner

## 2024-03-03 MED ORDER — NITROGLYCERIN 0.4 MG SL SUBL: 0.4000 mg | SUBLINGUAL_TABLET | SUBLINGUAL | 3 refills | Status: AC | PRN

## 2024-03-20 ENCOUNTER — Other Ambulatory Visit: Payer: Self-pay | Admitting: Family Medicine

## 2024-03-21 ENCOUNTER — Encounter: Payer: Self-pay | Admitting: Physician Assistant

## 2024-03-21 ENCOUNTER — Telehealth: Admitting: Physician Assistant

## 2024-03-21 DIAGNOSIS — R6883 Chills (without fever): Secondary | ICD-10-CM

## 2024-03-21 DIAGNOSIS — R52 Pain, unspecified: Secondary | ICD-10-CM

## 2024-03-21 DIAGNOSIS — R6889 Other general symptoms and signs: Secondary | ICD-10-CM

## 2024-03-21 MED ORDER — BENZONATATE 100 MG PO CAPS: 100.0000 mg | ORAL_CAPSULE | Freq: Three times a day (TID) | ORAL | 0 refills | Status: DC | PRN

## 2024-03-21 MED ORDER — OSELTAMIVIR PHOSPHATE 75 MG PO CAPS: 75.0000 mg | ORAL_CAPSULE | Freq: Two times a day (BID) | ORAL | 0 refills | Status: AC

## 2024-03-21 NOTE — Progress Notes (Signed)
 Virtual Visit Consent   Brent [name], you are scheduled for a virtual visit with a [facility] provider today. Just as with appointments in the office, your consent must be obtained to participate. Your consent will be active for this visit and any virtual visit you may have with one of our providers in the next 365 days. If you have a MyChart account, a copy of this consent can be sent to you electronically.  As this is a virtual visit, video technology does not allow for your provider to perform a traditional examination. This may limit your provider's ability to fully assess your condition. If your provider identifies any concerns that need to be evaluated in person or the need to arrange testing (such as labs, EKG, etc.), we will make arrangements to do so. Although advances in technology are sophisticated, we cannot ensure that it will always work on either your end or our end. If the connection with a video visit is poor, the visit may have to be switched to a telephone visit. With either a video or telephone visit, we are not always able to ensure that we have a secure connection.  By engaging in this virtual visit, you consent to the provision of healthcare and authorize for your insurance to be billed (if applicable) for the services provided during this visit. Depending on your insurance coverage, you may receive a charge related to this service.  I need to obtain your verbal consent now. Are you willing to proceed with your visit today? Brent [name] has provided verbal consent on 03/21/2024 for a virtual visit (video or telephone). Brent [name], [place]  Date: 03/21/2024 8:38 AM   Virtual Visit via Video Note   I, Brent [name], connected with  Brent [name]  ([id number], 03/01/1956) on 03/21/24 at  8:15 AM EST by a video-enabled telemedicine application and verified that I am speaking with the correct person using two identifiers.  Location: Patient: Virtual Visit  Location Patient: Home Provider: Virtual Visit Location Provider: Home Office   I discussed the limitations of evaluation and management by telemedicine and the availability of in person appointments. The patient expressed understanding and agreed to proceed.    History of Present Illness: Brent [name] is a 68 y.o. who identifies as a male who was assigned male at birth, and is being seen today for chills, congestion, cough and ST starting Sunday night into Monday morning. Denies recent travel or sick contact. Notes body aches. Unsure of fever itself. Has not tested for COVID/Flu.  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   Neck pain, bilateral 04/02/2015   Headache 11/26/2013   Nephrolithiasis 09/27/2013   Preventative health care 05/24/2013   Hearing loss 05/24/2013   Anemia 03/28/2013   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   History of colonic polyps 03/23/2013  Gout 03/04/2013   Hyperlipidemia, mixed 11/16/2012   Obstructive sleep apnea on CPAP 04/07/2012   CAD (coronary artery disease) 01/12/2012   Vitamin D  deficiency 10/14/2011   Gastroesophageal reflux disease 04/01/2011   Chronic pain of both knees 10/10/2010   Essential hypertension 07/14/2010   Morbid obesity (HCC) 03/27/2010   Diverticulosis of colon 10/29/2008   DOE (dyspnea on exertion) 06/29/2008   T2DM (type 2 diabetes mellitus) (HCC) 07/05/2007     Allergies:  Allergies  Allergen Reactions   Bee Venom Anaphylaxis   Lipitor [Atorvastatin ] Other (See Comments)    Myalgias, memory changes   Morphine  Other (See Comments)    hyperactive   Metformin  And Related Other (See Comments)    Myalgias and weakness   Lioresal  [Baclofen ] Other (See Comments)    Acted drunk   Medrol  [Methylprednisolone ] Other (See Comments)    Hyperglycemia    Zocor  [Simvastatin ] Other (See Comments)    Joint pain   Hydrocodone  Itching   Latex Rash   Pravachol  [Pravastatin ] Other (See Comments)    Joint pain   Medications:  Current Outpatient Medications:    benzonatate (TESSALON) 100 MG capsule, Take 1 capsule (100 mg total) by mouth 3 (three) times daily as needed for cough., Disp: 30 capsule, Rfl: 0   oseltamivir (TAMIFLU) 75 MG capsule, Take 1 capsule (75 mg total) by mouth 2 (two) times daily for 5 days., Disp: 10 capsule, Rfl: 0   acetaminophen  (TYLENOL ) 500 MG tablet, Take 1,000 mg by mouth every 6 (six) hours as needed for moderate pain or headache., Disp: , Rfl:    allopurinol  (ZYLOPRIM ) 100 MG tablet, Take 1 tablet (100 mg total) by mouth daily., Disp: 90 tablet, Rfl: 1   amitriptyline  (ELAVIL ) 50 MG tablet, TAKE 1 TABLET AT BEDTIME, Disp: 90 tablet, Rfl: 3   amoxicillin  (AMOXIL ) 500 MG capsule, Take 2,000 mg by mouth See admin instructions. Take 2,000 mg prior to dental appointments, Disp: , Rfl:    aspirin  EC 81 MG tablet, Take 1 tablet (81 mg total) by mouth daily. Swallow whole., Disp: 30 tablet, Rfl: 12   Cholecalciferol (VITAMIN D ) 50 MCG (2000 UT) tablet, Take 6,000 Units by mouth daily., Disp: , Rfl:    clopidogrel  (PLAVIX ) 75 MG tablet, Take 1 tablet (75 mg total) by mouth daily with breakfast., Disp: 90 tablet, Rfl: 2   Cyanocobalamin  (VITAMIN B-12) 1000 MCG SUBL, Place 1,000 mcg under the tongue 2 (two) times a week., Disp: , Rfl:    docusate sodium  (COLACE) 100 MG capsule, Take 100 mg by mouth daily as needed for mild constipation.,  Disp: , Rfl:    EPINEPHRINE  0.3 mg/0.3 mL IJ SOAJ injection, INJECT 0.3 MG (0.3 ML) INTO THE MUSCLE ONCE FOR 1 DOSE AS NEEDED FOR ANAPHYLAXIS AS DIRECTED, Disp: 2 each, Rfl: 11   escitalopram (LEXAPRO) 20 MG tablet, Take 1 tablet (20 mg total) by mouth daily., Disp: , Rfl:    ezetimibe  (ZETIA ) 10 MG tablet, TAKE 1 TABLET DAILY, Disp: 90 tablet, Rfl: 3   famotidine  (PEPCID ) 40 MG tablet, TAKE 1 TABLET AT BEDTIME AS NEEDED FOR HEARTBURN OR INDIGESTION, Disp: 90 tablet, Rfl: 3   fenofibrate  160 MG tablet, TAKE 1 TABLET DAILY, Disp: 90 tablet, Rfl: 0   fluticasone  (FLONASE ) 50 MCG/ACT nasal spray, Place 2 sprays into both nostrils daily., Disp: 48 g, Rfl: 1   gabapentin  (NEURONTIN ) 300 MG capsule, Take 300 mg by mouth 3 (three) times daily. Pt takes 3 tablets 900 mg in the  morning, 2 tablets 600 mg in the afternoon, and 3 tablets 900 mg at night/, Disp: , Rfl:    hydrochlorothiazide  (MICROZIDE ) 12.5 MG capsule, TAKE 1 CAPSULE DAILY, Disp: 90 capsule, Rfl: 1   isosorbide  mononitrate (IMDUR ) 120 MG 24 hr tablet, Take 1 tablet (120 mg total) by mouth daily., Disp: 90 tablet, Rfl: 3   losartan  (COZAAR ) 50 MG tablet, TAKE 1 TABLET DAILY, Disp: 90 tablet, Rfl: 3   Magnesium  100 MG CAPS, Take 1 capsule (100 mg total) by mouth daily., Disp: 30 capsule, Rfl: 1   methocarbamol  (ROBAXIN ) 500 MG tablet, TAKE 1 TABLET FOUR TIMES A DAY, Disp: 360 tablet, Rfl: 3   metoprolol  tartrate (LOPRESSOR ) 100 MG tablet, TAKE 1 TABLET TWICE A DAY, Disp: 180 tablet, Rfl: 2   mometasone  (ELOCON ) 0.1 % cream, Apply 1 Application topically daily. (Patient taking differently: Apply 1 Application topically daily as needed (irritation).), Disp: 90 g, Rfl: 1   Multiple Vitamin (MULTIVITAMIN) tablet, Take 1 tablet by mouth daily., Disp: , Rfl:    nitroGLYCERIN  (NITROSTAT ) 0.4 MG SL tablet, Place 1 tablet (0.4 mg total) under the tongue every 5 (five) minutes as needed., Disp: 75 tablet, Rfl: 3   NON FORMULARY, Pt uses a cpap nightly,  Disp: , Rfl:    oxycodone  (OXY-IR) 5 MG capsule, Take 5 mg by mouth in the morning, at noon, in the evening, and at bedtime., Disp: , Rfl:    pantoprazole  (PROTONIX ) 40 MG tablet, Take 1 tablet (40 mg total) by mouth 2 (two) times daily before a meal., Disp: 180 tablet, Rfl: 1   Pitavastatin  Calcium  4 MG TABS, Take 1 tablet (4 mg total) by mouth daily., Disp: 90 tablet, Rfl: 1   prazosin (MINIPRESS) 1 MG capsule, Take 1 mg by mouth at bedtime., Disp: , Rfl:    Semaglutide, 2 MG/DOSE, (OZEMPIC, 2 MG/DOSE,) 8 MG/3ML SOPN, Inject 2 mg into the skin once a week., Disp: , Rfl:   Observations/Objective: Patient is well-developed, well-nourished in no acute distress.  Resting comfortably  at home.  Head is normocephalic, atraumatic.  No labored breathing.  Speech is clear and coherent with logical content.  Patient is alert and oriented at baseline.   Assessment and Plan: 1. Chills (Primary) - oseltamivir (TAMIFLU) 75 MG capsule; Take 1 capsule (75 mg total) by mouth 2 (two) times daily for 5 days.  Dispense: 10 capsule; Refill: 0  2. Body aches  3. Flu-like symptoms - benzonatate (TESSALON) 100 MG capsule; Take 1 capsule (100 mg total) by mouth 3 (three) times daily as needed for cough.  Dispense: 30 capsule; Refill: 0 - oseltamivir (TAMIFLU) 75 MG capsule; Take 1 capsule (75 mg total) by mouth 2 (two) times daily for 5 days.  Dispense: 10 capsule; Refill: 0  Classic influenza symptoms. Will have him COVID test as precaution but pending positive result will treat for Flu. Supportive measures, OTC medications and Vitamin recommendations reviewed. Will start Tamiflu per orders. Tessalon per orders. Quarantine reviewed with patient. Will alter treatment if COVID testing positive.   Follow Up Instructions: I discussed the assessment and treatment plan with the patient. The patient was provided an opportunity to ask questions and all were answered. The patient agreed with the plan and demonstrated  an understanding of the instructions.  A copy of instructions were sent to the patient via MyChart unless otherwise noted below.   The patient was advised to call back or seek an in-person evaluation if the symptoms worsen or if  the condition fails to improve as anticipated.    Brent Velma Lunger, PA-C

## 2024-03-21 NOTE — Patient Instructions (Signed)
 Brent [name], thank you for joining [name], PA-C for today's virtual visit.  While this provider is not your primary care provider (PCP), if your PCP is located in our provider database this encounter information will be shared with them immediately following your visit.   A [facility] MyChart account gives you access to today's visit and all your visits, tests, and labs performed at [facility]  click here if you don't have a [facility] MyChart account or go to [email or web address]  Consent: (Patient) Brent [name] provided verbal consent for this virtual visit at the beginning of the encounter.  Current Medications:  Current Outpatient Medications:    benzonatate (TESSALON) 100 MG capsule, Take 1 capsule (100 mg total) by mouth 3 (three) times daily as needed for cough., Disp: 30 capsule, Rfl: 0   oseltamivir (TAMIFLU) 75 MG capsule, Take 1 capsule (75 mg total) by mouth 2 (two) times daily for 5 days., Disp: 10 capsule, Rfl: 0   acetaminophen  (TYLENOL ) 500 MG tablet, Take 1,000 mg by mouth every 6 (six) hours as needed for moderate pain or headache., Disp: , Rfl:    allopurinol  (ZYLOPRIM ) 100 MG tablet, Take 1 tablet (100 mg total) by mouth daily., Disp: 90 tablet, Rfl: 1   amitriptyline  (ELAVIL ) 50 MG tablet, TAKE 1 TABLET AT BEDTIME, Disp: 90 tablet, Rfl: 3   amoxicillin  (AMOXIL ) 500 MG capsule, Take 2,000 mg by mouth See admin instructions. Take 2,000 mg prior to dental appointments, Disp: , Rfl:    aspirin  EC 81 MG tablet, Take 1 tablet (81 mg total) by mouth daily. Swallow whole., Disp: 30 tablet, Rfl: 12   Cholecalciferol (VITAMIN D ) 50 MCG (2000 UT) tablet, Take 6,000 Units by mouth daily., Disp: , Rfl:    clopidogrel  (PLAVIX ) 75 MG tablet, Take 1 tablet (75 mg total) by mouth daily with breakfast., Disp: 90 tablet, Rfl: 2   Cyanocobalamin  (VITAMIN B-12) 1000 MCG SUBL, Place 1,000 mcg under the tongue 2 (two) times a week., Disp: , Rfl:     docusate sodium  (COLACE) 100 MG capsule, Take 100 mg by mouth daily as needed for mild constipation., Disp: , Rfl:    EPINEPHRINE  0.3 mg/0.3 mL IJ SOAJ injection, INJECT 0.3 MG (0.3 ML) INTO THE MUSCLE ONCE FOR 1 DOSE AS NEEDED FOR ANAPHYLAXIS AS DIRECTED, Disp: 2 each, Rfl: 11   escitalopram (LEXAPRO) 20 MG tablet, Take 1 tablet (20 mg total) by mouth daily., Disp: , Rfl:    ezetimibe  (ZETIA ) 10 MG tablet, TAKE 1 TABLET DAILY, Disp: 90 tablet, Rfl: 3   famotidine  (PEPCID ) 40 MG tablet, TAKE 1 TABLET AT BEDTIME AS NEEDED FOR HEARTBURN OR INDIGESTION, Disp: 90 tablet, Rfl: 3   fenofibrate  160 MG tablet, TAKE 1 TABLET DAILY, Disp: 90 tablet, Rfl: 0   fluticasone  (FLONASE ) 50 MCG/ACT nasal spray, Place 2 sprays into both nostrils daily., Disp: 48 g, Rfl: 1   gabapentin  (NEURONTIN ) 300 MG capsule, Take 300 mg by mouth 3 (three) times daily. Pt takes 3 tablets 900 mg in the morning, 2 tablets 600 mg in the afternoon, and 3 tablets 900 mg at night/, Disp: , Rfl:    hydrochlorothiazide  (MICROZIDE ) 12.5 MG capsule, TAKE 1 CAPSULE DAILY, Disp: 90 capsule, Rfl: 1   isosorbide  mononitrate (IMDUR ) 120 MG 24 hr tablet, Take 1 tablet (120 mg total) by mouth daily., Disp: 90 tablet, Rfl: 3   losartan  (COZAAR ) 50 MG tablet, TAKE 1 TABLET DAILY, Disp: 90 tablet, Rfl: 3  Magnesium  100 MG CAPS, Take 1 capsule (100 mg total) by mouth daily., Disp: 30 capsule, Rfl: 1   methocarbamol  (ROBAXIN ) 500 MG tablet, TAKE 1 TABLET FOUR TIMES A DAY, Disp: 360 tablet, Rfl: 3   metoprolol  tartrate (LOPRESSOR ) 100 MG tablet, TAKE 1 TABLET TWICE A DAY, Disp: 180 tablet, Rfl: 2   mometasone  (ELOCON ) 0.1 % cream, Apply 1 Application topically daily. (Patient taking differently: Apply 1 Application topically daily as needed (irritation).), Disp: 90 g, Rfl: 1   Multiple Vitamin (MULTIVITAMIN) tablet, Take 1 tablet by mouth daily., Disp: , Rfl:    nitroGLYCERIN  (NITROSTAT ) 0.4 MG SL tablet, Place 1 tablet (0.4 mg total) under the tongue  every 5 (five) minutes as needed., Disp: 75 tablet, Rfl: 3   NON FORMULARY, Pt uses a cpap nightly, Disp: , Rfl:    oxycodone  (OXY-IR) 5 MG capsule, Take 5 mg by mouth in the morning, at noon, in the evening, and at bedtime., Disp: , Rfl:    pantoprazole  (PROTONIX ) 40 MG tablet, Take 1 tablet (40 mg total) by mouth 2 (two) times daily before a meal., Disp: 180 tablet, Rfl: 1   Pitavastatin  Calcium  4 MG TABS, Take 1 tablet (4 mg total) by mouth daily., Disp: 90 tablet, Rfl: 1   prazosin (MINIPRESS) 1 MG capsule, Take 1 mg by mouth at bedtime., Disp: , Rfl:    Semaglutide, 2 MG/DOSE, (OZEMPIC, 2 MG/DOSE,) 8 MG/3ML SOPN, Inject 2 mg into the skin once a week., Disp: , Rfl:    Medications ordered in this encounter:  Meds ordered this encounter  Medications   benzonatate (TESSALON) 100 MG capsule    Sig: Take 1 capsule (100 mg total) by mouth 3 (three) times daily as needed for cough.    Dispense:  30 capsule    Refill:  0    Supervising Provider:   BLAISE ALEENE KIDD [id number]   oseltamivir (TAMIFLU) 75 MG capsule    Sig: Take 1 capsule (75 mg total) by mouth 2 (two) times daily for 5 days.    Dispense:  10 capsule    Refill:  0    Supervising Provider:   BLAISE ALEENE KIDD [[id number]]     *If you need refills on other medications prior to your next appointment, please contact your pharmacy*  Follow-Up: Call back or seek an in-person evaluation if the symptoms worsen or if the condition fails to improve as anticipated.  [facility] [phone]  Other Instructions Please keep well-hydrated and try to get plenty of rest. If you have a humidifier, place it in the bedroom and run it at night. Start a saline nasal rinse for nasal congestion. You can consider use of a nasal steroid spray like Flonase  or Nasacort OTC. You can alternate between Tylenol  and Ibuprofen if needed for fever, body aches, headache and/or throat pain. Salt water -gargles and chloraseptic spray can be very  beneficial for sore throat. Mucinex-DM for congestion or cough. Please take all prescribed medications as directed.  Remain out of work until [employer] for 24 hours without a fever-reducing medication, and you are feeling better.  You should mask until symptoms are resolved.  If anything worsens despite treatment, you need to be evaluated in-person. Please do not delay care.  Influenza, Adult Influenza is also called the flu. It is an infection in the lungs, nose, and throat (respiratory tract). It spreads easily from person to person (is contagious). The flu causes symptoms that are like a cold, along with  high fever and body aches. What are the causes? This condition is caused by the influenza virus. You can get the virus by: Breathing in droplets that are in the air after a person infected with the flu coughed or sneezed. Touching something that has the virus on it and then touching your mouth, nose, or eyes. What increases the risk? Certain things may make you more likely to get the flu. These include: Not washing your hands often. Having close contact with many people during cold and flu season. Touching your mouth, eyes, or nose without first washing your hands. Not getting a flu shot every year. You may have a higher risk for the flu, and serious problems, such as a lung infection (pneumonia), if you: Are older than 65. Are pregnant. Have a weakened disease-fighting system (immune system) because of a disease or because you are taking certain medicines. Have a long-term (chronic) condition, such as: Heart, kidney, or lung disease. Diabetes. Asthma. Have a liver disorder. Are very overweight (morbidly obese). Have anemia. What are the signs or symptoms? Symptoms usually begin suddenly and last 4-14 days. They may include: Fever and chills. Headaches, body aches, or muscle aches. Sore throat. Cough. Runny or stuffy (congested) nose. Feeling discomfort in your chest. Not  wanting to eat as much as normal. Feeling weak or tired. Feeling dizzy. Feeling sick to your stomach or throwing up. How is this treated? If the flu is found early, you can be treated with antiviral medicine. This can help to reduce how bad the illness is and how long it lasts. This may be given by mouth or through an IV tube. Taking care of yourself at home can help your symptoms get better. Your doctor may want you to: Take over-the-counter medicines. Drink plenty of fluids. The flu often goes away on its own. If you have very bad symptoms or other problems, you may be treated in a hospital. Follow these instructions at home:     Activity Rest as needed. Get plenty of sleep. Stay home from work or school as told by your doctor. Do not leave home until you do not have a fever for 24 hours without taking medicine. Leave home only to go to your doctor. Eating and drinking Take an ORS (oral rehydration solution). This is a drink that is sold at pharmacies and stores. Drink enough fluid to keep your pee pale yellow. Drink clear fluids in small amounts as you are able. Clear fluids include: Water . Ice chips. Fruit juice mixed with water . Low-calorie sports drinks. Eat bland foods that are easy to digest. Eat small amounts as you are able. These foods include: Bananas. Applesauce. Rice. Lean meats. Toast. Crackers. Do not eat or drink: Fluids that have a lot of sugar or caffeine. Alcohol. Spicy or fatty foods. General instructions Take over-the-counter and prescription medicines only as told by your doctor. Use a cool mist humidifier to add moisture to the air in your home. This can make it easier for you to breathe. When using a cool mist humidifier, clean it daily. Empty water  and replace with clean water . Cover your mouth and nose when you cough or sneeze. Wash your hands with soap and water  often and for at least 20 seconds. This is also important after you cough or sneeze.  If you cannot use soap and water , use alcohol-based hand sanitizer. Keep all follow-up visits. How is this prevented?  Get a flu shot every year. You may get the flu shot in  late summer, fall, or winter. Ask your doctor when you should get your flu shot. Avoid contact with people who are sick during fall and winter. This is cold and flu season. Contact a doctor if: You get new symptoms. You have: Chest pain. Watery poop (diarrhea). A fever. Your cough gets worse. You start to have more mucus. You feel sick to your stomach. You throw up. Get help right away if you: Have shortness of breath. Have trouble breathing. Have skin or nails that turn a bluish color. Have very bad pain or stiffness in your neck. Get a sudden headache. Get sudden pain in your face or ear. Cannot eat or drink without throwing up. These symptoms may represent a serious problem that is an emergency. Get medical help right away. Call your local emergency services (911 in the [place] Do not wait to see if the symptoms will go away. Do not drive yourself to the hospital. Summary Influenza is also called the flu. It is an infection in the lungs, nose, and throat. It spreads easily from person to person. Take over-the-counter and prescription medicines only as told by your doctor. Getting a flu shot every year is the best way to not get the flu. This information is not intended to replace advice given to you by your health care provider. Make sure you discuss any questions you have with your health care provider. Document Revised: 11/24/2019 Document Reviewed: 11/24/2019 Elsevier Patient Education  2023 Elsevier Inc.      If you have been instructed to have an in-person evaluation today at a local Urgent Care facility, please use the link below. It will take you to a list of all of our available [facility], including address, phone number and hours of operation. Please do not delay care.  [facility]  If you or a family member do not have a primary care provider, use the link below to schedule a visit and establish care. When you choose a [facility] primary care physician or advanced practice provider, you gain a long-term partner in health. Find a Primary Care Provider  Learn more about [facility] in-office and virtual care options: [facility] Get Care Now

## 2024-03-29 ENCOUNTER — Other Ambulatory Visit: Payer: Self-pay | Admitting: Cardiology

## 2024-03-29 DIAGNOSIS — R0989 Other specified symptoms and signs involving the circulatory and respiratory systems: Secondary | ICD-10-CM

## 2024-03-29 DIAGNOSIS — R0609 Other forms of dyspnea: Secondary | ICD-10-CM

## 2024-04-24 ENCOUNTER — Other Ambulatory Visit: Payer: Self-pay | Admitting: Cardiology

## 2024-04-24 NOTE — Progress Notes (Signed)
 ELSTER CORBELLO                                          MRN: [id number]   04/24/2024   The VBCI Quality Team Specialist reviewed this patient medical record for the purposes of chart review for care gap closure. The following were reviewed: abstraction for care gap closure-glycemic status assessment.    VBCI Quality Team

## 2024-04-26 ENCOUNTER — Ambulatory Visit: Admitting: Cardiology

## 2024-04-26 ENCOUNTER — Encounter: Payer: Self-pay | Admitting: Cardiology

## 2024-04-26 VITALS — BP 115/65 | HR 78 | Ht 68.0 in | Wt 249.4 lb

## 2024-04-26 DIAGNOSIS — R0609 Other forms of dyspnea: Secondary | ICD-10-CM

## 2024-04-26 DIAGNOSIS — E785 Hyperlipidemia, unspecified: Secondary | ICD-10-CM

## 2024-04-26 DIAGNOSIS — I25118 Atherosclerotic heart disease of native coronary artery with other forms of angina pectoris: Secondary | ICD-10-CM | POA: Diagnosis not present

## 2024-04-26 DIAGNOSIS — I10 Essential (primary) hypertension: Secondary | ICD-10-CM

## 2024-04-26 DIAGNOSIS — E1100 Type 2 diabetes mellitus with hyperosmolarity without nonketotic hyperglycemic-hyperosmolar coma (NKHHC): Secondary | ICD-10-CM

## 2024-04-26 DIAGNOSIS — Z955 Presence of coronary angioplasty implant and graft: Secondary | ICD-10-CM

## 2024-04-26 NOTE — Progress Notes (Signed)
 " Cardiology Office Note:  .   Date:  04/26/2024  ID:  Brent [name], DOB May 16, 1955, MRN [id number] PCP: Domenica Harlene LABOR, MD  [facility] Providers Cardiologist:  Oneil Parchment, MD    History of Present Illness: .   Brent [name] is a 69 y.o. male Discussed the use of AI scribe  History of Present Illness Brent [name] is a 69 year old male with coronary artery disease and type 2 diabetes who presents with shortness of breath on exertion.  He experiences shortness of breath during strenuous activities lasting more than five minutes. This symptom has become more noticeable recently, although it was not present immediately after his last cardiac procedure in August 2025. Prior to the procedure, he experienced chest pains, which led to a cardiac catheterization.  He underwent a cardiac catheterization on December 15, 2023, which showed patent RCA stents and successful PCI to the mid LAD region. An ultrasound of the heart post-procedure showed an ejection fraction of 50-55%. There was no aortic valve stenosis or mitral valve regurgitation.  He has a history of coronary artery disease with drug-eluting stents placed in the right coronary artery, type 2 diabetes managed with Ozempic, and morbid obesity. He also has a history of obstructive sleep apnea managed with CPAP, prior gastrointestinal bleed, hypertension, hyperlipidemia, and gastric bypass surgery in 2012.  He is currently on pitavastatin  4 mg daily, which has resulted in an LDL level of 55. He is also taking aspirin  and Plavix  following his recent stent placement.  His hemoglobin level was 11.1 in September 2025, which he describes as low normal, and he has not had an iron  infusion in almost a year. He attributes some of his hemoglobin issues to a knee replacement surgery.  He is on Lexapro for PTSD and has experienced some weight loss with Ozempic, losing about ten pounds over the past fall. He acknowledges that his weight  loss has been gradual.    Studies Reviewed: .        Results Labs LDL: 55 Hemoglobin (12/2023): 11.1 Hemoglobin A1c: 6.5  Diagnostic Cardiac catheterization (12/15/2023): Patent right coronary artery stents; successful percutaneous coronary intervention to mid left anterior descending artery region Echocardiogram: Left ventricular ejection fraction 50-55%; no aortic valve stenosis; no mitral valve regurgitation Risk Assessment/Calculations:            Physical Exam:   VS:  BP 115/65 (BP Location: Left Arm, Patient Position: Sitting, Cuff Size: Normal)   Pulse 78   Ht 5' 8 (1.727 m)   Wt 249 lb 6.4 oz (113.1 kg)   SpO2 95%   BMI 37.92 kg/m    Wt Readings from Last 3 Encounters:  04/26/24 249 lb 6.4 oz (113.1 kg)  01/14/24 255 lb (115.7 kg)  01/13/24 252 lb (114.3 kg)    GEN: Well nourished, well developed in no acute distress NECK: No JVD; No carotid bruits CARDIAC: RRR, no murmurs, no rubs, no gallops RESPIRATORY:  Clear to auscultation without rales, wheezing or rhonchi  ABDOMEN: Soft, non-tender, non-distended EXTREMITIES:  No edema; No deformity   ASSESSMENT AND PLAN: .    Assessment and Plan Assessment & Plan Coronary artery disease with prior stents Recent cardiac catheterization on August 27th, 2025, showed patent RCA stents and successful PCI to the mid LAD region. Echocardiogram revealed low normal ejection fraction (50-55%) with no significant valvular abnormalities. Reports increased dyspnea with exertion, particularly after strenuous activities. No severe findings on recent cardiac evaluation. Decision  made to avoid immediate stress testing due to recent PCI and focus on improving physical conditioning. - Encouraged daily walking and gradual increase in physical activity. - Advised against carrying heavy weights; suggested using a stationary bike or treadmill. - Continue aspirin  and Plavix  until February, then discontinue aspirin  and continue  Plavix .  Type 2 diabetes mellitus A1c is 6.5, indicating good glycemic control. Currently on semaglutide (Ozempic) with recent weight loss of approximately 10 pounds over the past fall. - Continue semaglutide (Ozempic). - Encouraged incorporation of physical activity to aid in weight management.  Morbid obesity, status post bariatric surgery Status post gastric bypass surgery in 2012. Recent weight loss of 10 pounds noted, likely due to semaglutide and lifestyle modifications. - Encouraged continued weight management through diet and exercise.  Hypertension No specific discussion of hypertension management in this encounter.  Hyperlipidemia LDL cholesterol is well-controlled at 55 mg/dL with pitavastatin  4 mg daily. - Continue pitavastatin  4 mg daily.  Obstructive sleep apnea Managed with CPAP therapy.  History of gastrointestinal hemorrhage No recent gastrointestinal bleeding reported.  Anemia Hemoglobin level is 11.1, indicating low normal range. No recent iron  infusions in the past year. Anemia likely related to previous knee replacement surgery. - Continue to monitor hemoglobin levels.         Dispo: 1 yr  Signed, Oneil Parchment, MD  "

## 2024-04-26 NOTE — Patient Instructions (Signed)
 Medication Instructions:   Your physician recommends that you continue on your current medications as directed. Please refer to the Current Medication list given to you today.  *If you need a refill on your cardiac medications before your next appointment, please call your pharmacy*    Follow-Up: At [facility], you and your health needs are our priority.  As part of our continuing mission to provide you with exceptional heart care, our providers are all part of one team.  This team includes your primary Cardiologist (physician) and Advanced Practice Providers or APPs (Physician Assistants and Nurse Practitioners) who all work together to provide you with the care you need, when you need it.  Your next appointment:   1 year(s)  Provider:   Dorothye Gathers, MD

## 2024-05-17 ENCOUNTER — Encounter (HOSPITAL_BASED_OUTPATIENT_CLINIC_OR_DEPARTMENT_OTHER): Payer: Self-pay | Admitting: Pulmonary Disease

## 2024-05-17 DIAGNOSIS — G4733 Obstructive sleep apnea (adult) (pediatric): Secondary | ICD-10-CM

## 2024-05-17 NOTE — Telephone Encounter (Signed)
 Order placed

## 2024-05-19 ENCOUNTER — Encounter: Payer: Self-pay | Admitting: Student

## 2024-05-19 ENCOUNTER — Ambulatory Visit: Admitting: Student

## 2024-05-19 VITALS — BP 150/88 | HR 80 | Temp 98.3°F | Resp 14 | Ht 68.0 in | Wt 256.2 lb

## 2024-05-19 DIAGNOSIS — K219 Gastro-esophageal reflux disease without esophagitis: Secondary | ICD-10-CM

## 2024-05-19 DIAGNOSIS — E119 Type 2 diabetes mellitus without complications: Secondary | ICD-10-CM | POA: Diagnosis not present

## 2024-05-19 DIAGNOSIS — G4733 Obstructive sleep apnea (adult) (pediatric): Secondary | ICD-10-CM

## 2024-05-19 DIAGNOSIS — M549 Dorsalgia, unspecified: Secondary | ICD-10-CM | POA: Diagnosis not present

## 2024-05-19 DIAGNOSIS — F5101 Primary insomnia: Secondary | ICD-10-CM

## 2024-05-19 DIAGNOSIS — Z0000 Encounter for general adult medical examination without abnormal findings: Secondary | ICD-10-CM | POA: Diagnosis not present

## 2024-05-19 DIAGNOSIS — E559 Vitamin D deficiency, unspecified: Secondary | ICD-10-CM | POA: Diagnosis not present

## 2024-05-19 DIAGNOSIS — E1100 Type 2 diabetes mellitus with hyperosmolarity without nonketotic hyperglycemic-hyperosmolar coma (NKHHC): Secondary | ICD-10-CM

## 2024-05-19 DIAGNOSIS — M1A9XX0 Chronic gout, unspecified, without tophus (tophi): Secondary | ICD-10-CM

## 2024-05-19 DIAGNOSIS — D508 Other iron deficiency anemias: Secondary | ICD-10-CM

## 2024-05-19 DIAGNOSIS — E669 Obesity, unspecified: Secondary | ICD-10-CM | POA: Insufficient documentation

## 2024-05-19 DIAGNOSIS — L578 Other skin changes due to chronic exposure to nonionizing radiation: Secondary | ICD-10-CM | POA: Diagnosis not present

## 2024-05-19 DIAGNOSIS — I2510 Atherosclerotic heart disease of native coronary artery without angina pectoris: Secondary | ICD-10-CM

## 2024-05-19 DIAGNOSIS — Z8551 Personal history of malignant neoplasm of bladder: Secondary | ICD-10-CM | POA: Insufficient documentation

## 2024-05-19 DIAGNOSIS — I10 Essential (primary) hypertension: Secondary | ICD-10-CM

## 2024-05-19 DIAGNOSIS — E538 Deficiency of other specified B group vitamins: Secondary | ICD-10-CM | POA: Diagnosis not present

## 2024-05-19 MED ORDER — ALLOPURINOL 100 MG PO TABS: 100.0000 mg | ORAL_TABLET | Freq: Every day | ORAL | 1 refills | Status: AC

## 2024-05-19 NOTE — Assessment & Plan Note (Signed)
 Supplement and monitor

## 2024-05-19 NOTE — Assessment & Plan Note (Addendum)
"  Uses CPAP nightly.         "

## 2024-05-19 NOTE — Assessment & Plan Note (Addendum)
 Hydrate and Monitor. Denies recent flares. Update uric acid.

## 2024-05-19 NOTE — Assessment & Plan Note (Signed)
 Stable. Asymptomatic. Hx of IDA r/t malabsorption s/p gastric bypass. Following with Hematology. Continue FU with hematology in March for repeat bloodwork

## 2024-05-19 NOTE — Assessment & Plan Note (Signed)
 S/p stent. On plavix . Following with Cardiology

## 2024-05-19 NOTE — Assessment & Plan Note (Addendum)
 Well controlled, no changes to meds. Encouraged heart healthy diet such as the DASH diet and exercise as tolerated.

## 2024-05-19 NOTE — Assessment & Plan Note (Addendum)
 Last hgba1c acceptable, minimize simple carbs. Increase exercise as tolerated. Continue current meds  On statin and ARB Following with Endocrinology- Ami Batty, NP- [facility]  Lab Results  Component Value Date   HGBA1C 6.5 01/06/2024   FU 4 months

## 2024-05-19 NOTE — Progress Notes (Signed)
 "  Subjective:     Patient ID: Brent [name], male    DOB: January 06, 1956, 69 y.o.   MRN: [id number]  Chief Complaint  Patient presents with   Annual Exam    Patient is here for his physical.    HPI  Discussed the use of AI scribe software for clinical note transcription with the patient, who gave verbal consent to proceed.  History of Present Illness Brent [name] Royale Swamy is a pleasant 69 year old male who presents for CPE. Overall feels well, no additional concerns today. Has regular dental and eye exams.  He has Hx of gout and was started on allopurinol  after a recent rise in uric acid and a foot flare a few months ago. He cannot use NSAIDs due to prior gastric bypass. He ran out of allopurinol  about a month ago and has not had a flare since.   His last A1c was 6.5, and he follows with endocrinology- [facility] every six months.   He had Hx bladder cancer treated with surgery two years ago. He now has nocturia three to four times nightly, which has been worsening. He is following with Urology.  He has hearing loss from his time in the [employer] [place] and uses hearing aids for midrange sounds.  Follows with: Endocrinology, Cardiology, Pain Management, Hematology  CAD s/p stent Plavix  75 mg daily Imdur  prn  HTN Losartan  50 mg, Metoprolol  100 mg BID, Hydrochlorothiazide  12.5 mg  Diabetes: Following with Endorinology - Compliant with medications - Denies symptoms of hypoglycemia, polyuria, polydipsia, numbness extremities, foot ulcers/trauma, visual changes, wounds that are not healing, medication side effects   HLD- Pitavastatin  4 mg daily, Zetia  10 mg daily  PTSD- Lexapro 20 mg  Gerd- Pantoprazole  40 mg BID, Pepcid  prn  Patient denies fever, chills, SOB, CP, palpitations, dyspnea, edema, HA, vision changes, N/V/D, abdominal pain, urinary symptoms, rash, weight changes, and recent illness or hospitalizations.   Health Maintenance Due  Topic Date Due   Diabetic kidney  evaluation - Urine ACR  05/19/2007    Past Medical History:  Diagnosis Date   Acid reflux disease    ACID REFLUX DISEASE 07/05/2007   Acute gastric ulcer with bleeding    Anemia    Arthritis    Atherosclerosis    Benign neoplasm of colon 07/05/2007   Bilateral hip pain 10/05/2016   Bladder cancer (HCC)    Breast pain, left 11/17/2011   CAD (coronary artery disease)    Chest pain    a. Reportedly negative dobut echo performed prior to gastric bypass in 03/2011;  b. 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 12/2011 Cath: LM nl, LAD 50p, D1 50m, LCX min irregs, OM3 30, RCA 25p, 44m (FFR 0.99->0.89), PDA 30, EF 65%, Med Rx. d. Cath 11/2021 - 90% m RCA s/p PCI/DES x1   COLONIC POLYPS, HX OF 10/29/2008   Diarrhea 06/13/2010   Diverticulosis 07/05/2018   DIVERTICULOSIS, COLON 10/29/2008   DM (diabetes mellitus), type 2, uncontrolled    GI bleed    Gout 03/04/2013   Hearing loss 05/24/2013   Previous audiology evaluation completely.   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   Neck  pain 03/2015   Other and unspecified hyperlipidemia 11/16/2012   Post-operative nausea and vomiting    after the surgery in hospital in March 2020/ past the cauderization   Preventative health care 11/17/2011   Raynaud's disease    Sleep apnea    a. CPAP   Spinal stenosis    Tear of meniscus of left knee 2012    Past Surgical History:  Procedure Laterality Date   ANKLE SURGERY Right 1994   BICEPS TENDON REPAIR     left side   CARDIAC CATHETERIZATION     denies any chest pain in the past 2 years   COLONOSCOPY     colonoscopy polyps     CORONARY PRESSURE/FFR WITH 3D MAPPING N/A 12/15/2023   Procedure: Coronary  Pressure/FFR w/3D Mapping;  Surgeon: Elmira Newman PARAS, MD;  [facility]: [facility];  Service: Cardiovascular;  Laterality: N/A;   CORONARY STENT INTERVENTION N/A 11/28/2021   Procedure: CORONARY STENT INTERVENTION;  Surgeon: Jordan, Peter M, MD;  Location: [facility] INVASIVE CV LAB;  Service: Cardiovascular;  Laterality: N/A;   CORONARY STENT INTERVENTION N/A 12/15/2023   Procedure: CORONARY STENT INTERVENTION;  Surgeon: Elmira Newman PARAS, MD;  Location: [facility];  Service: Cardiovascular;  Laterality: N/A;   CYSTOSCOPY WITH BIOPSY Bilateral 10/06/2022   Procedure: CYSTOSCOPY WITH BIOPSY FULGURATION BILATERAL RETROGRADE PYELOGRAM INSTILL GEMCITABINE ;  Surgeon: Nieves Cough, MD;  Location: [facility];  Service: Urology;  Laterality: Bilateral;  1 HR FOR CASE   ESOPHAGOGASTRODUODENOSCOPY N/A 03/27/2013   Procedure: ESOPHAGOGASTRODUODENOSCOPY (EGD);  Surgeon: Norleen LOISE Kiang, MD;  Location: [facility];  Service: Endoscopy;  Laterality: N/A;   ESOPHAGOGASTRODUODENOSCOPY (EGD) WITH PROPOFOL  N/A 07/06/2018   Procedure: ESOPHAGOGASTRODUODENOSCOPY (EGD) WITH PROPOFOL ;  Surgeon: Albertus Gordy HERO, MD;  Location: [facility];  Service: Gastroenterology;  Laterality: N/A;   ESOPHAGOGASTRODUODENOSCOPY (EGD) WITH PROPOFOL  N/A 02/05/2022   Procedure: ESOPHAGOGASTRODUODENOSCOPY (EGD) WITH PROPOFOL ;  Surgeon: San Sandor GAILS, DO;  Location: [facility];  Service: Gastroenterology;  Laterality: N/A;   GASTRIC BYPASS     HEMOSTASIS CLIP PLACEMENT  02/05/2022   Procedure: HEMOSTASIS CLIP PLACEMENT;  Surgeon: San Sandor GAILS, DO;  Location: [facility];  Service: Gastroenterology;;   HERNIA REPAIR     HOT HEMOSTASIS N/A 07/06/2018   Procedure: HOT HEMOSTASIS (ARGON PLASMA COAGULATION/BICAP);  Surgeon: Albertus Gordy HERO, MD;  Location: [facility];  Service: Gastroenterology;  Laterality: N/A;   KNEE ARTHROSCOPY Left 11/06/2010   Left, torn meniscus (repaired)   LEFT HEART CATH AND CORONARY ANGIOGRAPHY  N/A 11/28/2021   Procedure: LEFT HEART CATH AND CORONARY ANGIOGRAPHY;  Surgeon: Jordan, Peter M, MD;  Location: [facility] INVASIVE CV LAB;  Service: Cardiovascular;  Laterality: N/A;   LEFT HEART CATH AND CORONARY ANGIOGRAPHY N/A 12/15/2023   Procedure: LEFT HEART CATH AND CORONARY ANGIOGRAPHY;  Surgeon: Elmira Newman PARAS, MD;  Location: [facility];  Service: Cardiovascular;  Laterality: N/A;   LEFT HEART CATHETERIZATION WITH CORONARY ANGIOGRAM N/A 12/23/2011   Procedure: LEFT HEART CATHETERIZATION WITH CORONARY ANGIOGRAM;  Surgeon: Lynwood Schilling, MD;  Location: [facility] CATH LAB;  Service: Cardiovascular;  Laterality: N/A;   REPLACEMENT TOTAL KNEE Right 01/2016   removed scar tissue/ cut tip of nerve bundle and re-route   right knee arthroscopy Right 07/05/14   Dr. Prentice Collet, [facility].   ROTATOR CUFF REPAIR  2019   left   SCHLEROTHERAPY  07/06/2018   Procedure: SCHLEROTHERAPY;  Surgeon: Albertus Gordy HERO, MD;  Location: [facility];  Service: Gastroenterology;;   MATIAS  02/05/2022   Procedure: MATIAS;  Surgeon: San Sandor GAILS, DO;  Location: [facility];  Service: Gastroenterology;;   TONSILLECTOMY  age 43   TONSILLECTOMY     as a child   TOTAL KNEE ARTHROPLASTY Right 01/20/2016   Procedure: RIGHT TOTAL KNEE ARTHROPLASTY;  Surgeon: Donnice Car, MD;  Location: [facility];  Service: Orthopedics;  Laterality: Right;   TRANSURETHRAL RESECTION OF BLADDER TUMOR N/A 09/24/2020   Procedure: TRANSURETHRAL RESECTION OF BLADDER TUMOR (TURBT)  RIGHT retrograde pylegram;  Surgeon: Nieves Donnice, MD;  Location: [facility];  Service: Urology;  Laterality: N/A;   UPPER GI ENDOSCOPY  03/27/13    Family History  Problem Relation Age of Onset   Diabetes Mother    Hypertension Mother    Stroke Mother    Hyperlipidemia Mother    Hypertension Father    Colon polyps Father    Heart attack Father 66   Stroke Father    Heart attack Brother    Diabetes Brother    Heart disease Brother    Heart  attack Brother        Multiple   Diabetes Brother    Diabetes Sister    Stroke Sister    Obesity Brother    Diabetes Brother    Heart disease Brother    Hypertension Maternal Grandmother    ADD / ADHD Daughter    Heart disease Brother    Stomach cancer Neg Hx    Colon cancer Neg Hx    Esophageal cancer Neg Hx    Rectal cancer Neg Hx     Social History   Socioeconomic History   Marital status: Married    Spouse name: Not on file   Number of children: 2   Years of education: Not on file   Highest education level: Professional school degree (e.g., MD, DDS, DVM, JD)  Occupational History   Occupation: TEACHER    Employer: [employer]  Tobacco Use   Smoking status: Former    Current packs/day: 0.00    Average packs/day: 1.5 packs/day for 20.0 years (30.0 ttl pk-yrs)    Types: Cigarettes    Start date: 04/21/1971    Quit date: 04/21/1991    Years since quitting: 33.1   Smokeless tobacco: Never  Vaping Use   Vaping status: Never Used  Substance and Sexual Activity   Alcohol use: Yes    Alcohol/week: 4.0 standard drinks of alcohol    Types: 4 Cans of beer per week    Comment: occas   Drug use: No   Sexual activity: Not Currently  Other Topics Concern   Not on file  Social History Narrative   Lives with wife in [place].  Does not routinely exercise.   Social Drivers of Health   Tobacco Use: Medium Risk (05/19/2024)   Patient History    Smoking Tobacco Use: Former    Smokeless Tobacco Use: Never    Passive Exposure: Not on file  Financial Resource Strain: Low Risk (05/12/2024)   Overall Financial Resource Strain (CARDIA)    Difficulty of Paying Living Expenses: Not hard at all  Food Insecurity: No Food Insecurity (05/12/2024)   Epic    Worried About Programme Researcher, Broadcasting/film/video in the Last Year: Never true    Ran Out of Food in the Last Year: Never true  Transportation Needs: No Transportation Needs (05/12/2024)   Epic    Lack of Transportation (Medical): No     Lack of Transportation (Non-Medical): No  Physical Activity:  Inactive (05/12/2024)   Exercise Vital Sign    Days of Exercise per Week: 0 days    Minutes of Exercise per Session: Not on file  Stress: Stress Concern Present (05/12/2024)   [employer] of Occupational Health - Occupational Stress Questionnaire    Feeling of Stress: Very much  Social Connections: Socially Integrated (05/12/2024)   Social Connection and Isolation Panel    Frequency of Communication with Friends and Family: More than three times a week    Frequency of Social Gatherings with Friends and Family: More than three times a week    Attends Religious Services: More than 4 times per year    Active Member of Clubs or Organizations: Yes    Attends Banker Meetings: More than 4 times per year    Marital Status: Married  Catering Manager Violence: Not At Risk (11/16/2023)   Epic    Fear of Current or Ex-Partner: No    Emotionally Abused: No    Physically Abused: No    Sexually Abused: No  Depression (PHQ2-9): Low Risk (01/06/2024)   Depression (PHQ2-9)    PHQ-2 Score: 3  Alcohol Screen: Low Risk (05/12/2024)   Alcohol Screen    Last Alcohol Screening Score (AUDIT): 5  Housing: Low Risk (05/12/2024)   Epic    Unable to Pay for Housing in the Last Year: No    Number of Times Moved in the Last Year: 0    Homeless in the Last Year: No  Utilities: Low Risk (03/06/2024)   Received from [facility]   Utilities    In the past 12 months has the electric, gas, oil, or water  company threatened to shut off services in your home? : No  Health Literacy: Adequate Health Literacy (11/16/2023)   B1300 Health Literacy    Frequency of need for help with medical instructions: Never    Outpatient Medications Prior to Visit  Medication Sig Dispense Refill   acetaminophen  (TYLENOL ) 500 MG tablet Take 1,000 mg by mouth every 6 (six) hours as needed for moderate pain or headache.     amitriptyline  (ELAVIL ) 50 MG tablet  TAKE 1 TABLET AT BEDTIME 90 tablet 3   amoxicillin  (AMOXIL ) 500 MG capsule Take 2,000 mg by mouth See admin instructions. Take 2,000 mg prior to dental appointments     aspirin  EC 81 MG tablet Take 1 tablet (81 mg total) by mouth daily. Swallow whole. 30 tablet 12   Cholecalciferol (VITAMIN D ) 50 MCG (2000 UT) tablet Take 6,000 Units by mouth daily.     clopidogrel  (PLAVIX ) 75 MG tablet Take 1 tablet (75 mg total) by mouth daily with breakfast. 90 tablet 2   Cyanocobalamin  (VITAMIN B-12) 1000 MCG SUBL Place 1,000 mcg under the tongue 2 (two) times a week.     docusate sodium  (COLACE) 100 MG capsule Take 100 mg by mouth daily as needed for mild constipation.     EPINEPHRINE  0.3 mg/0.3 mL IJ SOAJ injection INJECT 0.3 MG (0.3 ML) INTO THE MUSCLE ONCE FOR 1 DOSE AS NEEDED FOR ANAPHYLAXIS AS DIRECTED 2 each 11   escitalopram (LEXAPRO) 20 MG tablet Take 1 tablet (20 mg total) by mouth daily.     ezetimibe  (ZETIA ) 10 MG tablet TAKE 1 TABLET DAILY 90 tablet 3   famotidine  (PEPCID ) 40 MG tablet TAKE 1 TABLET AT BEDTIME AS NEEDED FOR HEARTBURN OR INDIGESTION 90 tablet 3   fluticasone  (FLONASE ) 50 MCG/ACT nasal spray Place 2 sprays into both nostrils daily. 48 g  1   gabapentin  (NEURONTIN ) 800 MG tablet Take 800 mg by mouth 3 (three) times daily.     hydrochlorothiazide  (MICROZIDE ) 12.5 MG capsule TAKE 1 CAPSULE DAILY 90 capsule 2   isosorbide  mononitrate (IMDUR ) 120 MG 24 hr tablet Take 1 tablet (120 mg total) by mouth daily. 90 tablet 3   losartan  (COZAAR ) 50 MG tablet TAKE 1 TABLET DAILY 90 tablet 3   Magnesium  100 MG CAPS Take 1 capsule (100 mg total) by mouth daily. 30 capsule 1   methocarbamol  (ROBAXIN ) 500 MG tablet TAKE 1 TABLET FOUR TIMES A DAY 360 tablet 3   metoprolol  tartrate (LOPRESSOR ) 100 MG tablet TAKE 1 TABLET TWICE A DAY 180 tablet 3   mometasone  (ELOCON ) 0.1 % cream Apply 1 Application topically daily. 90 g 1   Multiple Vitamin (MULTIVITAMIN) tablet Take 1 tablet by mouth daily.      nitroGLYCERIN  (NITROSTAT ) 0.4 MG SL tablet Place 1 tablet (0.4 mg total) under the tongue every 5 (five) minutes as needed. 75 tablet 3   NON FORMULARY Pt uses a cpap nightly     oxycodone  (OXY-IR) 5 MG capsule Take 5 mg by mouth in the morning, at noon, in the evening, and at bedtime.     pantoprazole  (PROTONIX ) 40 MG tablet Take 1 tablet (40 mg total) by mouth 2 (two) times daily before a meal. 180 tablet 1   Pitavastatin  Calcium  4 MG TABS Take 1 tablet (4 mg total) by mouth daily. 90 tablet 1   prazosin (MINIPRESS) 1 MG capsule Take 1 mg by mouth at bedtime.     Semaglutide, 2 MG/DOSE, (OZEMPIC, 2 MG/DOSE,) 8 MG/3ML SOPN Inject 2 mg into the skin once a week.     allopurinol  (ZYLOPRIM ) 100 MG tablet Take 1 tablet (100 mg total) by mouth daily. 90 tablet 1   fenofibrate  160 MG tablet TAKE 1 TABLET DAILY 90 tablet 0   benzonatate  (TESSALON ) 100 MG capsule Take 1 capsule (100 mg total) by mouth 3 (three) times daily as needed for cough. (Patient not taking: Reported on 04/26/2024) 30 capsule 0   No facility-administered medications prior to visit.    Allergies[1]  ROS    See HPI Objective:    Physical Exam Constitutional:      General: He is not in acute distress.    Appearance: He is obese. He is not ill-appearing, toxic-appearing or diaphoretic.  HENT:     Head: Normocephalic and atraumatic.     Right Ear: Tympanic membrane, ear canal and external ear normal.     Left Ear: Tympanic membrane, ear canal and external ear normal.     Nose: Nose normal. No congestion.     Mouth/Throat:     Mouth: Mucous membranes are moist.     Pharynx: Oropharynx is clear.  Eyes:     Extraocular Movements: Extraocular movements intact.     Right eye: Normal extraocular motion.     Left eye: Normal extraocular motion.     Conjunctiva/sclera: Conjunctivae normal.     Pupils: Pupils are equal, round, and reactive to light.  Neck:     Thyroid : No thyroid  mass or thyromegaly.     Vascular: No carotid  bruit or JVD.  Cardiovascular:     Rate and Rhythm: Normal rate and regular rhythm.     Pulses: Normal pulses.     Heart sounds: Normal heart sounds, S1 normal and S2 normal. No murmur heard.    No friction rub. No gallop.  Pulmonary:  Effort: Pulmonary effort is normal. No respiratory distress.     Breath sounds: Normal breath sounds.  Abdominal:     General: Bowel sounds are normal. There is no distension.     Palpations: Abdomen is soft.     Tenderness: There is no abdominal tenderness. There is no guarding.  Musculoskeletal:        General: Normal range of motion.     Cervical back: Full passive range of motion without pain and normal range of motion. No edema or erythema.     Right lower leg: No edema.     Left lower leg: No edema.  Lymphadenopathy:     Cervical: No cervical adenopathy.  Skin:    General: Skin is warm and dry.     Capillary Refill: Capillary refill takes less than 2 seconds.  Neurological:     General: No focal deficit present.     Mental Status: He is alert and oriented to person, place, and time.     Cranial Nerves: No cranial nerve deficit.     Motor: No weakness.     Coordination: Coordination normal.     Gait: Gait normal.     Deep Tendon Reflexes: Reflexes normal.  Psychiatric:        Mood and Affect: Mood normal.        Behavior: Behavior normal.        Thought Content: Thought content normal.         BP (!) 150/88 (BP Location: Left Arm, Patient Position: Sitting, Cuff Size: Large)   Pulse 80   Temp 98.3 F (36.8 C) (Oral)   Resp 14   Ht 5' 8 (1.727 m)   Wt 256 lb 3.2 oz (116.2 kg)   SpO2 98%   BMI 38.96 kg/m  Wt Readings from Last 3 Encounters:  05/19/24 256 lb 3.2 oz (116.2 kg)  04/26/24 249 lb 6.4 oz (113.1 kg)  01/14/24 255 lb (115.7 kg)       Assessment & Plan:   Problem List Items Addressed This Visit       Cardiovascular and Mediastinum   CAD (coronary artery disease)   S/p stent. On plavix . Following with  Cardiology      Essential hypertension   Well controlled, no changes to meds. Encouraged heart healthy diet such as the DASH diet and exercise as tolerated.         Respiratory   Obstructive sleep apnea on CPAP   Uses CPAP nightly.         Digestive   Gastroesophageal reflux disease     Endocrine   T2DM (type 2 diabetes mellitus) (HCC)   Last hgba1c acceptable, minimize simple carbs. Increase exercise as tolerated. Continue current meds  On statin and ARB Following with Endocrinology- Ami Batty, NP- [facility]  Lab Results  Component Value Date   HGBA1C 6.5 01/06/2024   FU 4 months         Other   Back pain   PMH significant for cervical and thoracic radiculopathy.  S/P [place] SCS implant on 10/15/21  Following with pain management. Encouraged moist heat and gentle stretching as tolerated.      Gout   Hydrate and Monitor. Denies recent flares. Update uric acid.       Relevant Medications   allopurinol  (ZYLOPRIM ) 100 MG tablet   Other Relevant Orders   Uric acid   Hx of bladder cancer   Following with [facility]. Has had nocturia since bladder ca  Sx but c/o worsening nocturia, has upcoming appointment with Urology.      IDA (iron  deficiency anemia)   Stable. Asymptomatic. Hx of IDA r/t malabsorption s/p gastric bypass. Following with Hematology. Continue FU with hematology in March for repeat bloodwork      Insomnia   Continues Elavil . Stable.      Obesity (BMI 30-39.9)   Encouraged DASH or MIND diet, decrease po intake and increase exercise as tolerated. Needs 7-8 hours of sleep nightly. Avoid trans fats, eat small, frequent meals every 4-5 hours with lean proteins, complex carbs and healthy fats. Minimize simple carbs, high fat foods and processed foods .  BMI Readings from Last 3 Encounters:  05/19/24 38.96 kg/m  04/26/24 37.92 kg/m  01/14/24 38.77 kg/m         Vitamin B12 deficiency   Supplement and monitor       Vitamin D   deficiency   Supplement and monitor.      Other Visit Diagnoses       Annual physical exam    -  Primary     Sun-damaged skin       Relevant Orders   Ambulatory referral to Dermatology      Transition of care discussed with the patient following the recent change of PCP. He plans to establish care with new PCP closer to [facility] with [facility]. Pt advised to FU in 6 months here or with new PCP for continued care.  FU 6 months    I have discontinued Brent MICAEL [name] Don's fenofibrate  and benzonatate . I am also having him maintain his multivitamin, Vitamin B-12, Magnesium , oxycodone , acetaminophen , amoxicillin , mometasone , Vitamin D , aspirin  EC, NON FORMULARY, docusate sodium , fluticasone , famotidine , methocarbamol , gabapentin , escitalopram, EPINEPHrine , prazosin, Ozempic (2 MG/DOSE), clopidogrel , Pitavastatin  Calcium , pantoprazole , isosorbide  mononitrate, losartan , ezetimibe , nitroGLYCERIN , amitriptyline , hydrochlorothiazide , metoprolol  tartrate, and allopurinol .  Meds ordered this encounter  Medications   allopurinol  (ZYLOPRIM ) 100 MG tablet    Sig: Take 1 tablet (100 mg total) by mouth daily.    Dispense:  90 tablet    Refill:  1    Supervising Provider:   DOMENICA BLACKBIRD A [[id number]]      [1]  Allergies Allergen Reactions   Bee Venom Anaphylaxis   Lipitor [Atorvastatin ] Other (See Comments)    Myalgias, memory changes   Morphine  Other (See Comments)    hyperactive   Metformin  And Related Other (See Comments)    Myalgias and weakness   Lioresal  [Baclofen ] Other (See Comments)    Acted drunk   Medrol  [Methylprednisolone ] Other (See Comments)    Hyperglycemia    Zocor  [Simvastatin ] Other (See Comments)    Joint pain   Hydrocodone  Itching   Latex Rash   Pravachol  [Pravastatin ] Other (See Comments)    Joint pain   "

## 2024-05-19 NOTE — Assessment & Plan Note (Signed)
 Encouraged DASH or MIND diet, decrease po intake and increase exercise as tolerated. Needs 7-8 hours of sleep nightly. Avoid trans fats, eat small, frequent meals every 4-5 hours with lean proteins, complex carbs and healthy fats. Minimize simple carbs, high fat foods and processed foods .  BMI Readings from Last 3 Encounters:  05/19/24 38.96 kg/m  04/26/24 37.92 kg/m  01/14/24 38.77 kg/m

## 2024-05-19 NOTE — Assessment & Plan Note (Addendum)
 Following with [facility]. Has had nocturia since bladder ca Sx but c/o worsening nocturia, has upcoming appointment with Urology.

## 2024-05-19 NOTE — Assessment & Plan Note (Signed)
 Continues Elavil . Stable.

## 2024-06-28 ENCOUNTER — Encounter: Admitting: Family Medicine

## 2024-07-06 ENCOUNTER — Encounter (HOSPITAL_BASED_OUTPATIENT_CLINIC_OR_DEPARTMENT_OTHER): Admitting: Pulmonary Disease

## 2024-07-14 ENCOUNTER — Ambulatory Visit: Admitting: Family

## 2024-07-14 ENCOUNTER — Inpatient Hospital Stay
